# Patient Record
Sex: Female | Born: 1945 | Race: White | Hispanic: No | Marital: Single | Smoking: Former smoker
Health system: Southern US, Community
[De-identification: ages and names within clinical notes are randomized; demographics above are authoritative.]

## PROBLEM LIST (undated history)

## (undated) DIAGNOSIS — G4733 Obstructive sleep apnea (adult) (pediatric): Secondary | ICD-10-CM

## (undated) DIAGNOSIS — Z992 Dependence on renal dialysis: Secondary | ICD-10-CM

## (undated) DIAGNOSIS — Z9981 Dependence on supplemental oxygen: Secondary | ICD-10-CM

## (undated) DIAGNOSIS — F419 Anxiety disorder, unspecified: Secondary | ICD-10-CM

## (undated) DIAGNOSIS — M47819 Spondylosis without myelopathy or radiculopathy, site unspecified: Secondary | ICD-10-CM

## (undated) DIAGNOSIS — I83009 Varicose veins of unspecified lower extremity with ulcer of unspecified site: Secondary | ICD-10-CM

## (undated) DIAGNOSIS — S82409A Unspecified fracture of shaft of unspecified fibula, initial encounter for closed fracture: Secondary | ICD-10-CM

## (undated) DIAGNOSIS — R0602 Shortness of breath: Secondary | ICD-10-CM

## (undated) DIAGNOSIS — E119 Type 2 diabetes mellitus without complications: Secondary | ICD-10-CM

## (undated) DIAGNOSIS — D539 Nutritional anemia, unspecified: Secondary | ICD-10-CM

## (undated) DIAGNOSIS — I10 Essential (primary) hypertension: Secondary | ICD-10-CM

## (undated) DIAGNOSIS — K219 Gastro-esophageal reflux disease without esophagitis: Secondary | ICD-10-CM

## (undated) DIAGNOSIS — N189 Chronic kidney disease, unspecified: Secondary | ICD-10-CM

## (undated) DIAGNOSIS — E785 Hyperlipidemia, unspecified: Secondary | ICD-10-CM

## (undated) DIAGNOSIS — M8580 Other specified disorders of bone density and structure, unspecified site: Secondary | ICD-10-CM

## (undated) DIAGNOSIS — Z86718 Personal history of other venous thrombosis and embolism: Secondary | ICD-10-CM

## (undated) DIAGNOSIS — J9620 Acute and chronic respiratory failure, unspecified whether with hypoxia or hypercapnia: Secondary | ICD-10-CM

## (undated) DIAGNOSIS — N289 Disorder of kidney and ureter, unspecified: Secondary | ICD-10-CM

## (undated) DIAGNOSIS — N186 End stage renal disease: Secondary | ICD-10-CM

## (undated) DIAGNOSIS — I82409 Acute embolism and thrombosis of unspecified deep veins of unspecified lower extremity: Secondary | ICD-10-CM

## (undated) DIAGNOSIS — L97909 Non-pressure chronic ulcer of unspecified part of unspecified lower leg with unspecified severity: Secondary | ICD-10-CM

## (undated) DIAGNOSIS — M1990 Unspecified osteoarthritis, unspecified site: Secondary | ICD-10-CM

## (undated) HISTORY — DX: Type 2 diabetes mellitus without complications: E11.9

## (undated) HISTORY — DX: Essential (primary) hypertension: I10

## (undated) HISTORY — PX: TRIGGER FINGER RELEASE: SHX641

## (undated) HISTORY — DX: Hyperlipidemia, unspecified: E78.5

## (undated) HISTORY — DX: Obstructive sleep apnea (adult) (pediatric): G47.33

## (undated) HISTORY — DX: Spondylosis without myelopathy or radiculopathy, site unspecified: M47.819

## (undated) HISTORY — DX: Other specified disorders of bone density and structure, unspecified site: M85.80

## (undated) HISTORY — DX: Chronic kidney disease, unspecified: N18.9

## (undated) HISTORY — DX: Gastro-esophageal reflux disease without esophagitis: K21.9

---
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
## 1997-03-10 HISTORY — PX: BREAST SURGERY: SHX581

## 1997-10-10 HISTORY — PX: TOTAL KNEE ARTHROPLASTY: SHX125

## 1997-10-10 HISTORY — PX: JOINT REPLACEMENT: SHX530

## 1998-04-20 ENCOUNTER — Inpatient Hospital Stay (HOSPITAL_COMMUNITY): Admission: RE | Admit: 1998-04-20 | Discharge: 1998-04-24 | Payer: Self-pay | Admitting: Orthopedic Surgery

## 1998-04-24 ENCOUNTER — Inpatient Hospital Stay (HOSPITAL_COMMUNITY)
Admission: RE | Admit: 1998-04-24 | Discharge: 1998-05-04 | Payer: Self-pay | Admitting: Physical Medicine and Rehabilitation

## 1998-07-03 ENCOUNTER — Encounter: Admission: RE | Admit: 1998-07-03 | Discharge: 1998-10-01 | Payer: Self-pay | Admitting: Family Medicine

## 1998-09-17 ENCOUNTER — Ambulatory Visit (HOSPITAL_COMMUNITY): Admission: RE | Admit: 1998-09-17 | Discharge: 1998-09-17 | Payer: Self-pay | Admitting: Family Medicine

## 1998-09-17 ENCOUNTER — Encounter: Payer: Self-pay | Admitting: Family Medicine

## 1999-09-29 ENCOUNTER — Ambulatory Visit (HOSPITAL_COMMUNITY): Admission: RE | Admit: 1999-09-29 | Discharge: 1999-09-29 | Payer: Self-pay | Admitting: Family Medicine

## 1999-09-29 ENCOUNTER — Encounter: Payer: Self-pay | Admitting: Family Medicine

## 2000-10-04 ENCOUNTER — Encounter: Payer: Self-pay | Admitting: Family Medicine

## 2000-10-04 ENCOUNTER — Ambulatory Visit (HOSPITAL_COMMUNITY): Admission: RE | Admit: 2000-10-04 | Discharge: 2000-10-04 | Payer: Self-pay | Admitting: Family Medicine

## 2001-06-01 ENCOUNTER — Encounter: Admission: RE | Admit: 2001-06-01 | Discharge: 2001-06-19 | Payer: Self-pay | Admitting: Orthopedic Surgery

## 2001-07-05 ENCOUNTER — Encounter: Payer: Self-pay | Admitting: Orthopedic Surgery

## 2001-07-05 ENCOUNTER — Encounter: Admission: RE | Admit: 2001-07-05 | Discharge: 2001-07-05 | Payer: Self-pay | Admitting: Orthopedic Surgery

## 2001-09-26 ENCOUNTER — Ambulatory Visit (HOSPITAL_COMMUNITY): Admission: RE | Admit: 2001-09-26 | Discharge: 2001-09-26 | Payer: Self-pay | Admitting: Internal Medicine

## 2001-09-26 ENCOUNTER — Encounter (HOSPITAL_BASED_OUTPATIENT_CLINIC_OR_DEPARTMENT_OTHER): Payer: Self-pay | Admitting: Internal Medicine

## 2002-08-27 ENCOUNTER — Encounter (HOSPITAL_BASED_OUTPATIENT_CLINIC_OR_DEPARTMENT_OTHER): Payer: Self-pay | Admitting: Internal Medicine

## 2002-08-27 ENCOUNTER — Ambulatory Visit (HOSPITAL_COMMUNITY): Admission: RE | Admit: 2002-08-27 | Discharge: 2002-08-27 | Payer: Self-pay | Admitting: Internal Medicine

## 2003-08-14 ENCOUNTER — Ambulatory Visit (HOSPITAL_COMMUNITY): Admission: RE | Admit: 2003-08-14 | Discharge: 2003-08-14 | Payer: Self-pay | Admitting: Internal Medicine

## 2004-08-23 ENCOUNTER — Ambulatory Visit (HOSPITAL_COMMUNITY): Admission: RE | Admit: 2004-08-23 | Discharge: 2004-08-23 | Payer: Self-pay | Admitting: Internal Medicine

## 2005-01-29 ENCOUNTER — Encounter: Admission: RE | Admit: 2005-01-29 | Discharge: 2005-01-29 | Payer: Self-pay | Admitting: Internal Medicine

## 2005-02-11 ENCOUNTER — Encounter: Admission: RE | Admit: 2005-02-11 | Discharge: 2005-02-11 | Payer: Self-pay | Admitting: Internal Medicine

## 2005-04-24 ENCOUNTER — Emergency Department (HOSPITAL_COMMUNITY): Admission: EM | Admit: 2005-04-24 | Discharge: 2005-04-24 | Payer: Self-pay | Admitting: *Deleted

## 2005-09-07 ENCOUNTER — Ambulatory Visit (HOSPITAL_COMMUNITY): Admission: RE | Admit: 2005-09-07 | Discharge: 2005-09-07 | Payer: Self-pay | Admitting: Internal Medicine

## 2005-09-08 ENCOUNTER — Ambulatory Visit (HOSPITAL_COMMUNITY): Admission: RE | Admit: 2005-09-08 | Discharge: 2005-09-08 | Payer: Self-pay | Admitting: Internal Medicine

## 2006-04-28 ENCOUNTER — Encounter: Admission: RE | Admit: 2006-04-28 | Discharge: 2006-04-28 | Payer: Self-pay | Admitting: Internal Medicine

## 2006-10-09 ENCOUNTER — Ambulatory Visit (HOSPITAL_COMMUNITY): Admission: RE | Admit: 2006-10-09 | Discharge: 2006-10-09 | Payer: Self-pay | Admitting: Internal Medicine

## 2007-03-15 ENCOUNTER — Encounter: Admission: RE | Admit: 2007-03-15 | Discharge: 2007-03-15 | Payer: Self-pay | Admitting: Internal Medicine

## 2007-04-24 ENCOUNTER — Encounter: Admission: RE | Admit: 2007-04-24 | Discharge: 2007-04-24 | Payer: Self-pay | Admitting: Orthopaedic Surgery

## 2007-08-13 ENCOUNTER — Emergency Department (HOSPITAL_COMMUNITY): Admission: EM | Admit: 2007-08-13 | Discharge: 2007-08-14 | Payer: Self-pay | Admitting: Emergency Medicine

## 2007-10-15 ENCOUNTER — Ambulatory Visit (HOSPITAL_COMMUNITY): Admission: RE | Admit: 2007-10-15 | Discharge: 2007-10-15 | Payer: Self-pay | Admitting: Internal Medicine

## 2007-10-24 ENCOUNTER — Encounter: Admission: RE | Admit: 2007-10-24 | Discharge: 2007-10-24 | Payer: Self-pay | Admitting: Internal Medicine

## 2008-10-28 ENCOUNTER — Ambulatory Visit (HOSPITAL_COMMUNITY): Admission: RE | Admit: 2008-10-28 | Discharge: 2008-10-28 | Payer: Self-pay | Admitting: Internal Medicine

## 2008-11-05 ENCOUNTER — Encounter (INDEPENDENT_AMBULATORY_CARE_PROVIDER_SITE_OTHER): Payer: Self-pay | Admitting: Radiology

## 2008-11-05 ENCOUNTER — Encounter: Admission: RE | Admit: 2008-11-05 | Discharge: 2008-11-05 | Payer: Self-pay | Admitting: Internal Medicine

## 2009-01-23 ENCOUNTER — Emergency Department (HOSPITAL_COMMUNITY): Admission: EM | Admit: 2009-01-23 | Discharge: 2009-01-23 | Payer: Self-pay | Admitting: Emergency Medicine

## 2009-06-11 ENCOUNTER — Emergency Department (HOSPITAL_COMMUNITY): Admission: EM | Admit: 2009-06-11 | Discharge: 2009-06-11 | Payer: Self-pay | Admitting: Emergency Medicine

## 2009-06-29 ENCOUNTER — Encounter: Admission: RE | Admit: 2009-06-29 | Discharge: 2009-06-29 | Payer: Self-pay | Admitting: Orthopaedic Surgery

## 2009-10-01 ENCOUNTER — Emergency Department (HOSPITAL_COMMUNITY): Admission: EM | Admit: 2009-10-01 | Discharge: 2009-10-01 | Payer: Self-pay | Admitting: Emergency Medicine

## 2009-11-10 ENCOUNTER — Ambulatory Visit (HOSPITAL_COMMUNITY): Admission: RE | Admit: 2009-11-10 | Discharge: 2009-11-10 | Payer: Self-pay | Admitting: Internal Medicine

## 2010-04-07 ENCOUNTER — Ambulatory Visit (HOSPITAL_COMMUNITY): Admission: RE | Admit: 2010-04-07 | Discharge: 2010-04-07 | Payer: Self-pay | Admitting: Internal Medicine

## 2010-10-31 ENCOUNTER — Encounter (HOSPITAL_BASED_OUTPATIENT_CLINIC_OR_DEPARTMENT_OTHER): Payer: Self-pay | Admitting: Internal Medicine

## 2010-11-30 ENCOUNTER — Other Ambulatory Visit (HOSPITAL_BASED_OUTPATIENT_CLINIC_OR_DEPARTMENT_OTHER): Payer: Self-pay | Admitting: Internal Medicine

## 2010-11-30 DIAGNOSIS — Z1231 Encounter for screening mammogram for malignant neoplasm of breast: Secondary | ICD-10-CM

## 2010-12-02 ENCOUNTER — Ambulatory Visit (HOSPITAL_COMMUNITY)
Admission: RE | Admit: 2010-12-02 | Discharge: 2010-12-02 | Disposition: A | Discharge status: Home / Self Care | Payer: Medicare Other | Source: Ambulatory Visit | Attending: Internal Medicine | Admitting: Internal Medicine

## 2010-12-02 DIAGNOSIS — Z1231 Encounter for screening mammogram for malignant neoplasm of breast: Secondary | ICD-10-CM

## 2011-01-10 LAB — URINALYSIS, ROUTINE W REFLEX MICROSCOPIC
Bilirubin Urine: NEGATIVE
Glucose, UA: NEGATIVE mg/dL
Hgb urine dipstick: NEGATIVE
Ketones, ur: NEGATIVE mg/dL
Nitrite: NEGATIVE
Protein, ur: NEGATIVE mg/dL
Specific Gravity, Urine: 1.016 (ref 1.005–1.030)
Urobilinogen, UA: 0.2 mg/dL (ref 0.0–1.0)
pH: 6 (ref 5.0–8.0)

## 2011-01-10 LAB — CBC
HCT: 35 % — ABNORMAL LOW (ref 36.0–46.0)
Hemoglobin: 11.7 g/dL — ABNORMAL LOW (ref 12.0–15.0)
MCHC: 33.4 g/dL (ref 30.0–36.0)
MCV: 86.6 fL (ref 78.0–100.0)
Platelets: 282 10*3/uL (ref 150–400)
RBC: 4.05 MIL/uL (ref 3.87–5.11)
RDW: 18.8 % — ABNORMAL HIGH (ref 11.5–15.5)
WBC: 9.6 10*3/uL (ref 4.0–10.5)

## 2011-01-10 LAB — POCT CARDIAC MARKERS
CKMB, poc: 5.6 ng/mL (ref 1.0–8.0)
Myoglobin, poc: 334 ng/mL (ref 12–200)
Troponin i, poc: <0.05 ng/mL (ref 0.00–0.09)

## 2011-01-10 LAB — POCT I-STAT, CHEM 8
BUN: 38 mg/dL — ABNORMAL HIGH (ref 6–23)
Calcium, Ion: 1.24 mmol/L (ref 1.12–1.32)
Chloride: 104 mEq/L (ref 96–112)
Creatinine, Ser: 1.9 mg/dL — ABNORMAL HIGH (ref 0.4–1.2)
Glucose, Bld: 180 mg/dL — ABNORMAL HIGH (ref 70–99)
HCT: 36 % (ref 36.0–46.0)
Hemoglobin: 12.2 g/dL (ref 12.0–15.0)
Potassium: 4 mEq/L (ref 3.5–5.1)
Sodium: 138 mEq/L (ref 135–145)
TCO2: 30 mmol/L (ref 0–100)

## 2011-01-10 LAB — URINE CULTURE: Colony Count: 80000

## 2011-01-10 LAB — DIFFERENTIAL
Basophils Absolute: 0 10*3/uL (ref 0.0–0.1)
Basophils Relative: 1 % (ref 0–1)
Eosinophils Absolute: 0.2 10*3/uL (ref 0.0–0.7)
Eosinophils Relative: 2 % (ref 0–5)
Lymphocytes Relative: 15 % (ref 12–46)
Lymphs Abs: 1.5 10*3/uL (ref 0.7–4.0)
Monocytes Absolute: 0.6 10*3/uL (ref 0.1–1.0)
Monocytes Relative: 7 % (ref 3–12)
Neutro Abs: 7.3 10*3/uL (ref 1.7–7.7)
Neutrophils Relative %: 76 % (ref 43–77)

## 2011-01-10 LAB — PROTIME-INR
INR: 0.95 (ref 0.00–1.49)
Prothrombin Time: 12.6 seconds (ref 11.6–15.2)

## 2011-01-19 LAB — URINALYSIS, ROUTINE W REFLEX MICROSCOPIC
Glucose, UA: NEGATIVE mg/dL
Ketones, ur: NEGATIVE mg/dL
Nitrite: NEGATIVE
Protein, ur: 100 mg/dL — AB
Specific Gravity, Urine: 1.017 (ref 1.005–1.030)
Urobilinogen, UA: 0.2 mg/dL (ref 0.0–1.0)
pH: 6 (ref 5.0–8.0)

## 2011-01-19 LAB — COMPREHENSIVE METABOLIC PANEL
ALT: 43 U/L — ABNORMAL HIGH (ref 0–35)
AST: 26 U/L (ref 0–37)
Albumin: 3.1 g/dL — ABNORMAL LOW (ref 3.5–5.2)
Alkaline Phosphatase: 134 U/L — ABNORMAL HIGH (ref 39–117)
BUN: 18 mg/dL (ref 6–23)
CO2: 30 mEq/L (ref 19–32)
Calcium: 9.2 mg/dL (ref 8.4–10.5)
Chloride: 96 mEq/L (ref 96–112)
Creatinine, Ser: 1.85 mg/dL — ABNORMAL HIGH (ref 0.4–1.2)
GFR calc Af Amer: 33 mL/min — ABNORMAL LOW (ref >60)
GFR calc non Af Amer: 28 mL/min — ABNORMAL LOW (ref >60)
Glucose, Bld: 239 mg/dL — ABNORMAL HIGH (ref 70–99)
Potassium: 3.8 mEq/L (ref 3.5–5.1)
Sodium: 134 mEq/L — ABNORMAL LOW (ref 135–145)
Total Bilirubin: 0.6 mg/dL (ref 0.3–1.2)
Total Protein: 6.6 g/dL (ref 6.0–8.3)

## 2011-01-19 LAB — DIFFERENTIAL
Basophils Absolute: 0 10*3/uL (ref 0.0–0.1)
Basophils Relative: 0 % (ref 0–1)
Eosinophils Absolute: 0 10*3/uL (ref 0.0–0.7)
Eosinophils Relative: 0 % (ref 0–5)
Lymphocytes Relative: 4 % — ABNORMAL LOW (ref 12–46)
Lymphs Abs: 0.8 10*3/uL (ref 0.7–4.0)
Monocytes Absolute: 1 10*3/uL (ref 0.1–1.0)
Monocytes Relative: 5 % (ref 3–12)
Neutro Abs: 18.3 10*3/uL — ABNORMAL HIGH (ref 1.7–7.7)
Neutrophils Relative %: 91 % — ABNORMAL HIGH (ref 43–77)

## 2011-01-19 LAB — URINE CULTURE: Colony Count: >=100000

## 2011-01-19 LAB — GLUCOSE, CAPILLARY: Glucose-Capillary: 246 mg/dL — ABNORMAL HIGH (ref 70–99)

## 2011-01-19 LAB — URINE MICROSCOPIC-ADD ON

## 2011-01-19 LAB — CBC
HCT: 36.7 % (ref 36.0–46.0)
Hemoglobin: 12.3 g/dL (ref 12.0–15.0)
MCHC: 33.6 g/dL (ref 30.0–36.0)
MCV: 83.4 fL (ref 78.0–100.0)
Platelets: 261 10*3/uL (ref 150–400)
RBC: 4.4 MIL/uL (ref 3.87–5.11)
RDW: 16.5 % — ABNORMAL HIGH (ref 11.5–15.5)
WBC: 20.2 10*3/uL — ABNORMAL HIGH (ref 4.0–10.5)
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
## 2011-02-28 ENCOUNTER — Encounter: Payer: Self-pay | Admitting: Gastroenterology

## 2011-04-06 ENCOUNTER — Ambulatory Visit: Payer: Medicare Other | Admitting: Gastroenterology

## 2011-07-19 LAB — DIFFERENTIAL
Basophils Absolute: 0
Basophils Relative: 0
Eosinophils Absolute: 0
Eosinophils Relative: 0
Lymphocytes Relative: 3 — ABNORMAL LOW
Lymphs Abs: 0.3 — ABNORMAL LOW
Monocytes Absolute: 0.2
Monocytes Relative: 2 — ABNORMAL LOW
Neutro Abs: 10.3 — ABNORMAL HIGH
Neutrophils Relative %: 95 — ABNORMAL HIGH

## 2011-07-19 LAB — CULTURE, BLOOD (ROUTINE X 2)
Culture: NO GROWTH
Culture: NO GROWTH

## 2011-07-19 LAB — COMPREHENSIVE METABOLIC PANEL
ALT: 19
AST: 21
Albumin: 3.7
Alkaline Phosphatase: 55
BUN: 21
CO2: 28
Calcium: 8.8
Chloride: 102
Creatinine, Ser: 1.18
GFR calc Af Amer: 56 — ABNORMAL LOW
GFR calc non Af Amer: 47 — ABNORMAL LOW
Glucose, Bld: 141 — ABNORMAL HIGH
Potassium: 3.6
Sodium: 140
Total Bilirubin: 0.8
Total Protein: 6.3

## 2011-07-19 LAB — URINALYSIS, ROUTINE W REFLEX MICROSCOPIC
Bilirubin Urine: NEGATIVE
Glucose, UA: NEGATIVE
Hgb urine dipstick: NEGATIVE
Ketones, ur: NEGATIVE
Nitrite: NEGATIVE
Protein, ur: NEGATIVE
Specific Gravity, Urine: >1.046 — ABNORMAL HIGH
Urobilinogen, UA: 0.2
pH: 6

## 2011-07-19 LAB — CBC
HCT: 37
Hemoglobin: 12.5
MCHC: 33.7
MCV: 89.7
Platelets: 234
RBC: 4.12
RDW: 14.8 — ABNORMAL HIGH
WBC: 10.8 — ABNORMAL HIGH

## 2011-07-19 LAB — LIPASE, BLOOD: Lipase: 65 — ABNORMAL HIGH

## 2011-11-04 ENCOUNTER — Other Ambulatory Visit (HOSPITAL_COMMUNITY): Payer: Self-pay | Admitting: Internal Medicine

## 2011-11-04 DIAGNOSIS — R0600 Dyspnea, unspecified: Secondary | ICD-10-CM

## 2011-11-11 ENCOUNTER — Ambulatory Visit (HOSPITAL_COMMUNITY)
Admission: RE | Admit: 2011-11-11 | Discharge: 2011-11-11 | Disposition: A | Discharge status: Home / Self Care | Payer: Medicare Other | Source: Ambulatory Visit | Attending: Internal Medicine | Admitting: Internal Medicine

## 2011-11-11 ENCOUNTER — Other Ambulatory Visit (HOSPITAL_COMMUNITY): Payer: Self-pay | Admitting: Radiology

## 2011-11-11 DIAGNOSIS — R0609 Other forms of dyspnea: Secondary | ICD-10-CM | POA: Insufficient documentation

## 2011-11-11 DIAGNOSIS — R0989 Other specified symptoms and signs involving the circulatory and respiratory systems: Secondary | ICD-10-CM | POA: Insufficient documentation

## 2011-11-11 MED ORDER — ALBUTEROL SULFATE (5 MG/ML) 0.5% IN NEBU
2.5000 mg | INHALATION_SOLUTION | Freq: Once | RESPIRATORY_TRACT | Status: AC
  Administered 2011-11-11: 2.5 mg via RESPIRATORY_TRACT

## 2012-01-11 ENCOUNTER — Other Ambulatory Visit (HOSPITAL_COMMUNITY): Payer: Self-pay | Admitting: Internal Medicine

## 2012-01-11 DIAGNOSIS — Z1231 Encounter for screening mammogram for malignant neoplasm of breast: Secondary | ICD-10-CM

## 2012-02-03 ENCOUNTER — Ambulatory Visit (HOSPITAL_COMMUNITY)
Admission: RE | Admit: 2012-02-03 | Discharge: 2012-02-03 | Disposition: A | Discharge status: Home / Self Care | Payer: Medicare Other | Source: Ambulatory Visit | Attending: Internal Medicine | Admitting: Internal Medicine

## 2012-02-03 DIAGNOSIS — Z1231 Encounter for screening mammogram for malignant neoplasm of breast: Secondary | ICD-10-CM

## 2012-12-10 ENCOUNTER — Other Ambulatory Visit: Payer: Self-pay | Admitting: Internal Medicine

## 2012-12-10 DIAGNOSIS — R109 Unspecified abdominal pain: Secondary | ICD-10-CM

## 2012-12-12 ENCOUNTER — Ambulatory Visit
Admission: RE | Admit: 2012-12-12 | Discharge: 2012-12-12 | Disposition: A | Discharge status: Home / Self Care | Payer: Medicare Other | Source: Ambulatory Visit | Attending: Internal Medicine | Admitting: Internal Medicine

## 2012-12-12 ENCOUNTER — Other Ambulatory Visit: Payer: Self-pay | Admitting: Internal Medicine

## 2012-12-12 DIAGNOSIS — R109 Unspecified abdominal pain: Secondary | ICD-10-CM

## 2012-12-18 ENCOUNTER — Telehealth (INDEPENDENT_AMBULATORY_CARE_PROVIDER_SITE_OTHER): Payer: Self-pay

## 2012-12-18 NOTE — Telephone Encounter (Signed)
Reviewing records for up coming appt and noted change on CT. Reviewed with Dr Dalbert Batman in office. Per his recommendation I called pt to review any symptoms of her hernia. Pt states she has no hardness,pain,nausea or difficulty having BM at this time. She states she understands to call of pain, hardness at site, nausea, or change in bowel habits occur to call office or go to ER.

## 2013-01-02 ENCOUNTER — Encounter (HOSPITAL_COMMUNITY): Payer: Self-pay | Admitting: Pharmacy Technician

## 2013-01-02 ENCOUNTER — Ambulatory Visit (INDEPENDENT_AMBULATORY_CARE_PROVIDER_SITE_OTHER): Payer: Medicare Other | Admitting: Surgery

## 2013-01-02 ENCOUNTER — Encounter (INDEPENDENT_AMBULATORY_CARE_PROVIDER_SITE_OTHER): Payer: Self-pay | Admitting: Surgery

## 2013-01-02 VITALS — BP 120/70 | HR 91 | Temp 97.2°F | Resp 22 | Ht 65.0 in | Wt 221.8 lb

## 2013-01-02 DIAGNOSIS — K436 Other and unspecified ventral hernia with obstruction, without gangrene: Secondary | ICD-10-CM | POA: Insufficient documentation

## 2013-01-02 NOTE — Patient Instructions (Signed)
[place]  01/02/2013   Your procedure is scheduled on:  01/08/13 Tuesday   Report to [facility] at    0730   AM.  Call this number if you have problems the morning of surgery: [phone]       Remember: TAKE ONE HALF DOSAGE INSULIN Monday NIGHT-  EAT SNACK BEFORE BEDTIME OR MIDNIGHT Monday NIGHT  Do not eat food  Or drink :After Midnight.MONDAY NIGHT   Take these medicines the morning of surgery with A SIP OF WATER :PROLISEC DO NOT TAKE ANY DIABETES MEDICINE MORNING OF SURGERY  .  Contacts, dentures or partial plates can not be worn to surgery  Leave suitcase in the car. After surgery it may be brought to your room.  For patients admitted to the hospital, checkout time is 11:00 AM day of  discharge.             SPECIAL INSTRUCTIONS- SEE [place] PREPARING FOR SURGERY INSTRUCTION SHEET-     DO NOT WEAR JEWELRY, LOTIONS, POWDERS, OR PERFUMES.  WOMEN-- DO NOT SHAVE LEGS OR UNDERARMS FOR 12 HOURS BEFORE SHOWERS. MEN MAY SHAVE FACE.  Patients discharged the day of surgery will not be allowed to drive home. IF going home the day of surgery, you must have a driver and someone to stay with you for the first 24 hours  Name and phone number of your driver:                                                                        Please read over the following fact sheets that you were given: MRSA Information, Incentive Spirometry Sheet, Blood Transfusion Sheet  Information                                                                                   Jean Mcclain  PST 336  [id number]                 [place]                                                  Patient Signature _____________________________

## 2013-01-02 NOTE — Patient Instructions (Signed)
[facility]  HERNIA REPAIR POST OP INSTRUCTIONS  Always review your discharge instruction sheet given to you by the facility where your surgery was performed.  1. A  prescription for pain medication may be given to you upon discharge.  Take your pain medication as prescribed.  If narcotic pain medicine is not needed, then you may take acetaminophen (Tylenol) or ibuprofen (Advil) as needed.  2. Take your usually prescribed medications unless otherwise directed.  3. If you need a refill on your pain medication, please contact your pharmacy.  They will contact our office to request authorization. Prescriptions will not be filled after 5 pm daily or on weekends.  4. You should follow a light diet the first 24 hours after arrival home, such as soup and crackers or toast.  Be sure to include plenty of fluids daily.  Resume your normal diet the day after surgery.  5. Most patients will experience some swelling and bruising around the surgical site.  Ice packs and reclining will help.  Swelling and bruising can take several days to resolve.   6. It is common to experience some constipation if taking pain medication after surgery.  Increasing fluid intake and taking a stool softener (such as Colace) will usually help or prevent this problem from occurring.  A mild laxative (Milk of Magnesia or Miralax) should be taken according to package directions if there are no bowel movements after 48 hours.  7. Unless discharge instructions indicate otherwise, you may remove your bandages 24-48 hours after surgery, and you may shower at that time.  You may have steri-strips (small skin tapes) in place directly over the incision.  These strips should be left on the skin for 7-10 days.  If your surgeon used skin glue on the incision, you may shower in 24 hours.  The glue will flake off over the next 2-3 weeks.  Any sutures or staples will be removed at the office during your follow-up  visit.  8. ACTIVITIES:  You may resume regular (light) daily activities beginning the next day-such as daily self-care, walking, climbing stairs-gradually increasing activities as tolerated.  You may have sexual intercourse when it is comfortable.  Refrain from any heavy lifting or straining until approved by your doctor.  You may drive when you are no longer taking prescription pain medication, you can comfortably wear a seatbelt, and you can safely maneuver your car and apply brakes.  9. You should see your doctor in the office for a follow-up appointment approximately 2-3 weeks after your surgery.  Make sure that you call for this appointment within a day or two after you arrive home to insure a convenient appointment time. 10.   WHEN TO CALL YOUR DOCTOR: 1. Fever greater than 101.0 2. Inability to urinate 3. Persistent nausea and/or vomiting 4. Extreme swelling or bruising 5. Continued bleeding from incision 6. Increased pain, redness, or drainage from the incision  The clinic staff is available to answer your questions during regular business hours.  Please don't hesitate to call and ask to speak to one of the nurses for clinical concerns.  If you have a medical emergency, go to the nearest emergency room or call 911.  A surgeon from [facility] is always on call for the hospital.   [facility] [address], [address], [place], [place]  [address]  [phone] ? [phone] ? FAX [phone]  [email or web address]   

## 2013-01-02 NOTE — Progress Notes (Signed)
General Surgery [facility]  Chief Complaint  Patient presents with  . New Evaluation    eval hernia - referral from Dr. Leanna Battles    HISTORY: Patient is a 67 year old white female referred by her primary care physician for incarcerated ventral hernia. Patient was first noted on CT scan in June of 2011 to have a ventral hernia near the level of the umbilicus in the midline. This contained incarcerated fat. Over the past 6 months the patient has noted enlargement of the bulge at the level of the umbilicus and has developed intermittent pain. Pain may be related to meals. She denies any change in bowel habits. She denies any previous abdominal surgery.  CT scan of the abdomen performed at 12/12/2012 shows incarcerated transverse colon with mild bowel wall thickening. There is no obstruction.  Past Medical History  Diagnosis Date  . Diabetes mellitus, type 2   . Facet joint disease   . Hyperlipidemia   . GERD (gastroesophageal reflux disease)   . Osteopenia   . Vitamin D deficiency   . OSA (obstructive sleep apnea)   . Hypertension      Current Outpatient Prescriptions  Medication Sig Dispense Refill  . Canagliflozin (INVOKANA) 100 MG TABS Take by mouth.      . ergocalciferol (VITAMIN D2) 50000 UNITS capsule Take 50,000 Units by mouth once a week.        . ferrous sulfate 325 (65 FE) MG tablet Take 325 mg by mouth daily with breakfast.        . HUMULIN N PEN 100 UNIT/ML injection       . insulin aspart (NOVOLOG) 100 UNIT/ML injection Inject 10 Units into the skin 2 (two) times daily before a meal.        . lovastatin (MEVACOR) 40 MG tablet Take 40 mg by mouth at bedtime.        Marland Kitchen olmesartan-hydrochlorothiazide (BENICAR HCT) 40-12.5 MG per tablet Take 1 tablet by mouth daily.        Marland Kitchen omeprazole (PRILOSEC) 20 MG capsule Take 20 mg by mouth daily.        Marland Kitchen rOPINIRole (REQUIP) 4 MG tablet Take 4 mg by mouth 2 (two) times daily.         No current  facility-administered medications for this visit.     No Known Allergies   Family History  Problem Relation Age of Onset  . Leukemia Mother   . Hypertension Brother   . Emphysema Father      History   Social History  . Marital Status: Divorced    Spouse Name: N/A    Number of Children: N/A  . Years of Education: N/A   Social History Main Topics  . Smoking status: Former Smoker -- 27 years    Types: Cigarettes    Quit date: 10/10/1981  . Smokeless tobacco: Never Used  . Alcohol Use: No  . Drug Use: No  . Sexually Active: None   Other Topics Concern  . None   Social History Narrative  . None     REVIEW OF SYSTEMS - PERTINENT POSITIVES ONLY: Denies change in bowel habits. Intermittent pain with gradual enlargement of the bulge at the level of the umbilicus. No prior abdominal surgery.  EXAM: Filed Vitals:   01/02/13 0930  BP: 120/70  Pulse: 91  Temp: 97.2 F (36.2 C)  Resp: 22    HEENT: normocephalic; pupils equal and reactive; sclerae clear; dentition good; mucous membranes moist NECK:  symmetric on extension; no palpable anterior or posterior cervical lymphadenopathy; no supraclavicular masses; no tenderness CHEST: clear to auscultation bilaterally without rales, rhonchi, or wheezes CARDIAC: regular rate and rhythm without significant murmur; peripheral pulses are full ABDOMEN: Morbidly obese; soft without distension; bowel sounds present; no mass; no hepatosplenomegaly; obvious bulge just below into the left of the umbilicus consistent with hernia; palpation reveals mild tenderness; partially reducible but not fully reducible EXT:  non-tender without edema; no deformity NEURO: no gross focal deficits; no sign of tremor   LABORATORY RESULTS: See [facility] (CHL-Epic) for most recent results   RADIOLOGY RESULTS: See [facility] (CHL-Epic) for most recent results   IMPRESSION: #1 incarcerated ventral hernia, symptomatic #2  insulin-dependent diabetes #3 hypertension #4 morbid obesity  PLAN: I discussed the above findings with the patient and her family. I believe she needs to undergo operative repair in the near future. We discussed open technique versus laparoscopic technique. I feel she would be a good candidate for laparoscopic surgery with a large overlap of mesh around the fascial defect. We discussed the risk and benefits of the procedure including the possibility of open surgery and the possibility of recurrence. I explained that her risk of recurrence of her hernia was approximately 5-10%. We discussed the hospital stay to be anticipated and the restrictions on her activities following the procedure. We discussed the need to wear an abdominal binder for approximately 6 weeks. She understands and wishes to proceed with surgery in the near future.  The risks and benefits of the procedure have been discussed at length with the patient.  The patient understands the proposed procedure, potential alternative treatments, and the course of recovery to be expected.  All of the patient's questions have been answered at this time.  The patient wishes to proceed with surgery.  Earnstine Regal, MD, [facility]   Visit Diagnoses: 1. Incarcerated ventral hernia     Primary Care Physician: Donnajean Lopes, MD

## 2013-01-03 ENCOUNTER — Inpatient Hospital Stay (HOSPITAL_COMMUNITY)
Admission: RE | Admit: 2013-01-03 | Discharge: 2013-01-03 | Disposition: A | Discharge status: Home / Self Care | Payer: Medicare Other | Source: Ambulatory Visit

## 2013-01-03 NOTE — Patient Instructions (Signed)
[place]  01/03/2013   Your procedure is scheduled on: 01/08/13    Report to [place] at    0730 AM.  Call this number if you have problems the morning of surgery: [phone]   Remember:   Do not eat food or drink liquids after midnight.   Take these medicines the morning of surgery with A SIP OF WATER:    Do not wear jewelry, make-up or nail polish.  Do not wear lotions, powders, or perfumes.   Do not shave 48 hours prior to surgery.   Do not bring valuables to the hospital.  Contacts, dentures or bridgework may not be worn into surgery.  Leave suitcase in the car. After surgery it may be brought to your room.  For patients admitted to the hospital, checkout time is 11:00 AM the day of  discharge.    SEE [facility] INSTRUCTION SHEET    Please read over the following fact sheets that you were given: MRSA Information, coughing and deep breathing exercises, leg exercises               Failure to comply with these instructions may result in cancellation of your surgery.                Patient Signature ____________________________              Nurse Signature _____________________________

## 2013-01-04 ENCOUNTER — Telehealth (INDEPENDENT_AMBULATORY_CARE_PROVIDER_SITE_OTHER): Payer: Self-pay

## 2013-01-04 ENCOUNTER — Encounter (HOSPITAL_COMMUNITY)
Admission: RE | Admit: 2013-01-04 | Discharge: 2013-01-04 | Disposition: A | Discharge status: Home / Self Care | Payer: Medicare Other | Source: Ambulatory Visit | Attending: Surgery | Admitting: Surgery

## 2013-01-04 ENCOUNTER — Encounter (HOSPITAL_COMMUNITY): Payer: Self-pay

## 2013-01-04 HISTORY — DX: Shortness of breath: R06.02

## 2013-01-04 HISTORY — DX: Unspecified osteoarthritis, unspecified site: M19.90

## 2013-01-04 LAB — CBC
HCT: 39.1 % (ref 36.0–46.0)
Hemoglobin: 12.3 g/dL (ref 12.0–15.0)
MCH: 29.4 pg (ref 26.0–34.0)
MCHC: 31.5 g/dL (ref 30.0–36.0)
MCV: 93.3 fL (ref 78.0–100.0)
Platelets: 235 10*3/uL (ref 150–400)
RBC: 4.19 MIL/uL (ref 3.87–5.11)
RDW: 14.4 % (ref 11.5–15.5)
WBC: 6.9 10*3/uL (ref 4.0–10.5)

## 2013-01-04 LAB — SURGICAL PCR SCREEN
MRSA, PCR: NEGATIVE
Staphylococcus aureus: NEGATIVE

## 2013-01-04 LAB — BASIC METABOLIC PANEL
BUN: 32 mg/dL — ABNORMAL HIGH (ref 6–23)
CO2: 28 mEq/L (ref 19–32)
Calcium: 9 mg/dL (ref 8.4–10.5)
Chloride: 97 mEq/L (ref 96–112)
Creatinine, Ser: 1.44 mg/dL — ABNORMAL HIGH (ref 0.50–1.10)
GFR calc Af Amer: 43 mL/min — ABNORMAL LOW (ref >90)
GFR calc non Af Amer: 37 mL/min — ABNORMAL LOW (ref >90)
Glucose, Bld: 358 mg/dL — ABNORMAL HIGH (ref 70–99)
Potassium: 4.1 mEq/L (ref 3.5–5.1)
Sodium: 135 mEq/L (ref 135–145)

## 2013-01-04 NOTE — Telephone Encounter (Signed)
Jean Mcclain at presurg testing notified I spoke with Jean Mcclain at Dr Shon Baton office and elevated blood sugar 250-400 is where this pts level usually runs. She also indicated pt had breakfast prior to labs and did not take her insulin this morning. I advised her Dr Philip Aspen will fax note of clearance and I will have MR scan in to media as soon as it comes in. Jean Mcclain states she will make note of this for anesthesia.

## 2013-01-04 NOTE — Progress Notes (Signed)
Abnormal BMP results faxed via Inbox of epic to Dr Harlow Asa.

## 2013-01-04 NOTE — Telephone Encounter (Signed)
Patient called back and is now calling her PMD regarding the elevated blood glucose to see what they want her to do.  Patient informed of CBG 358 at pre-op.

## 2013-01-04 NOTE — Progress Notes (Signed)
Jean Mcclain, from office of [facility] spoke with Dr Philip Aspen office regarding blood glucose results on preop labs from today. Per Jean Mcclain, per office of Dr Philip Aspen patient's blood glucose averages 250-400.  Jean Mcclain is to have scanned in under media clearance note form Dr Philip Aspen.  Jean Mcclain stated that patient had eaten breakfast prior to preop appointment but had not taken insulin this am.

## 2013-01-04 NOTE — Telephone Encounter (Signed)
Dr Harlow Asa request we try to reach pt and have her seen by her PCP asap due to elevated blood glucose at pre op testing. If pt cannot see the MD that manages her blood sugar levels we may have to cx surgery on Tuesday. I have lmom to call office asap.

## 2013-01-04 NOTE — Telephone Encounter (Signed)
DJ from Dr Shon Baton office called stating pt normally has blood sugar levels 250-400. She states this is the levels the pt has had for years. DJ will fax clearance note from Dr Philip Aspen.

## 2013-01-07 ENCOUNTER — Telehealth (INDEPENDENT_AMBULATORY_CARE_PROVIDER_SITE_OTHER): Payer: Self-pay

## 2013-01-07 ENCOUNTER — Encounter (INDEPENDENT_AMBULATORY_CARE_PROVIDER_SITE_OTHER): Payer: Self-pay

## 2013-01-07 NOTE — Telephone Encounter (Signed)
Clearance note from Dr Philip Aspen received and to be scanned.

## 2013-01-08 ENCOUNTER — Observation Stay (HOSPITAL_COMMUNITY)
Admission: RE | Admit: 2013-01-08 | Discharge: 2013-01-11 | Disposition: A | Discharge status: Home / Self Care | Payer: Medicare Other | Source: Ambulatory Visit | Attending: Surgery | Admitting: Surgery

## 2013-01-08 ENCOUNTER — Encounter (HOSPITAL_COMMUNITY): Payer: Self-pay | Admitting: Anesthesiology

## 2013-01-08 ENCOUNTER — Ambulatory Visit (HOSPITAL_COMMUNITY): Payer: Medicare Other | Admitting: Anesthesiology

## 2013-01-08 ENCOUNTER — Encounter (HOSPITAL_COMMUNITY): Payer: Self-pay | Admitting: *Deleted

## 2013-01-08 ENCOUNTER — Encounter (HOSPITAL_COMMUNITY)
Admission: RE | Disposition: A | Discharge status: Home / Self Care | Payer: Self-pay | Source: Ambulatory Visit | Attending: Surgery

## 2013-01-08 DIAGNOSIS — Z794 Long term (current) use of insulin: Secondary | ICD-10-CM | POA: Insufficient documentation

## 2013-01-08 DIAGNOSIS — M949 Disorder of cartilage, unspecified: Secondary | ICD-10-CM | POA: Insufficient documentation

## 2013-01-08 DIAGNOSIS — G4733 Obstructive sleep apnea (adult) (pediatric): Secondary | ICD-10-CM | POA: Insufficient documentation

## 2013-01-08 DIAGNOSIS — E559 Vitamin D deficiency, unspecified: Secondary | ICD-10-CM | POA: Insufficient documentation

## 2013-01-08 DIAGNOSIS — K219 Gastro-esophageal reflux disease without esophagitis: Secondary | ICD-10-CM | POA: Insufficient documentation

## 2013-01-08 DIAGNOSIS — K436 Other and unspecified ventral hernia with obstruction, without gangrene: Secondary | ICD-10-CM

## 2013-01-08 DIAGNOSIS — E785 Hyperlipidemia, unspecified: Secondary | ICD-10-CM | POA: Insufficient documentation

## 2013-01-08 DIAGNOSIS — I10 Essential (primary) hypertension: Secondary | ICD-10-CM | POA: Insufficient documentation

## 2013-01-08 DIAGNOSIS — M899 Disorder of bone, unspecified: Secondary | ICD-10-CM | POA: Insufficient documentation

## 2013-01-08 DIAGNOSIS — E119 Type 2 diabetes mellitus without complications: Secondary | ICD-10-CM | POA: Insufficient documentation

## 2013-01-08 DIAGNOSIS — Z79899 Other long term (current) drug therapy: Secondary | ICD-10-CM | POA: Insufficient documentation

## 2013-01-08 HISTORY — PX: VENTRAL HERNIA REPAIR: SHX424

## 2013-01-08 HISTORY — PX: INSERTION OF MESH: SHX5868

## 2013-01-08 LAB — GLUCOSE, CAPILLARY
Glucose-Capillary: 267 mg/dL — ABNORMAL HIGH (ref 70–99)
Glucose-Capillary: 266 mg/dL — ABNORMAL HIGH (ref 70–99)
Glucose-Capillary: 228 mg/dL — ABNORMAL HIGH (ref 70–99)
Glucose-Capillary: 215 mg/dL — ABNORMAL HIGH (ref 70–99)
Glucose-Capillary: 185 mg/dL — ABNORMAL HIGH (ref 70–99)

## 2013-01-08 SURGERY — REPAIR, HERNIA, VENTRAL, LAPAROSCOPIC
Anesthesia: General | Wound class: Clean

## 2013-01-08 MED ORDER — PROMETHAZINE HCL 25 MG/ML IJ SOLN: 6.2500 mg | INTRAMUSCULAR | Status: DC | PRN

## 2013-01-08 MED ORDER — HYDROMORPHONE HCL PF 1 MG/ML IJ SOLN
INTRAMUSCULAR | Status: AC
  Filled 2013-01-08: qty 1

## 2013-01-08 MED ORDER — PROPOFOL 10 MG/ML IV BOLUS
INTRAVENOUS | Status: DC | PRN
  Administered 2013-01-08: 150 mg via INTRAVENOUS

## 2013-01-08 MED ORDER — OXYCODONE HCL 5 MG/5ML PO SOLN: 5.0000 mg | Freq: Once | ORAL | Status: DC | PRN

## 2013-01-08 MED ORDER — LACTATED RINGERS IV SOLN
INTRAVENOUS | Status: DC | PRN
  Administered 2013-01-08 (×2): via INTRAVENOUS

## 2013-01-08 MED ORDER — ACETAMINOPHEN 10 MG/ML IV SOLN
1000.0000 mg | Freq: Four times a day (QID) | INTRAVENOUS | Status: AC
  Administered 2013-01-08 – 2013-01-09 (×3): 1000 mg via INTRAVENOUS
  Filled 2013-01-08 (×6): qty 100

## 2013-01-08 MED ORDER — MIDAZOLAM HCL 5 MG/5ML IJ SOLN
INTRAMUSCULAR | Status: DC | PRN
  Administered 2013-01-08: 2 mg via INTRAVENOUS

## 2013-01-08 MED ORDER — EPHEDRINE SULFATE 50 MG/ML IJ SOLN
INTRAMUSCULAR | Status: DC | PRN
  Administered 2013-01-08: 5 mg via INTRAVENOUS
  Administered 2013-01-08: 10 mg via INTRAVENOUS
  Administered 2013-01-08: 5 mg via INTRAVENOUS

## 2013-01-08 MED ORDER — HYDROMORPHONE HCL PF 1 MG/ML IJ SOLN
0.2500 mg | INTRAMUSCULAR | Status: DC | PRN
  Administered 2013-01-08 (×2): 0.5 mg via INTRAVENOUS

## 2013-01-08 MED ORDER — CEFAZOLIN SODIUM-DEXTROSE 2-3 GM-% IV SOLR
2.0000 g | INTRAVENOUS | Status: AC
  Administered 2013-01-08: 2 g via INTRAVENOUS

## 2013-01-08 MED ORDER — MEPERIDINE HCL 50 MG/ML IJ SOLN: 6.2500 mg | INTRAMUSCULAR | Status: DC | PRN

## 2013-01-08 MED ORDER — HYDROMORPHONE HCL PF 1 MG/ML IJ SOLN
0.2500 mg | INTRAMUSCULAR | Status: DC | PRN
  Administered 2013-01-08 (×4): 0.5 mg via INTRAVENOUS

## 2013-01-08 MED ORDER — LACTATED RINGERS IV SOLN: INTRAVENOUS | Status: DC

## 2013-01-08 MED ORDER — NEOSTIGMINE METHYLSULFATE 1 MG/ML IJ SOLN
INTRAMUSCULAR | Status: DC | PRN
  Administered 2013-01-08: 3.5 mg via INTRAVENOUS

## 2013-01-08 MED ORDER — INSULIN ASPART 100 UNIT/ML ~~LOC~~ SOLN
0.0000 [IU] | Freq: Three times a day (TID) | SUBCUTANEOUS | Status: DC
  Administered 2013-01-08 – 2013-01-09 (×2): 5 [IU] via SUBCUTANEOUS
  Administered 2013-01-09: 3 [IU] via SUBCUTANEOUS
  Administered 2013-01-09: 5 [IU] via SUBCUTANEOUS
  Administered 2013-01-10: 3 [IU] via SUBCUTANEOUS
  Administered 2013-01-10 (×2): 5 [IU] via SUBCUTANEOUS
  Administered 2013-01-11: 2 [IU] via SUBCUTANEOUS

## 2013-01-08 MED ORDER — ONDANSETRON HCL 4 MG/2ML IJ SOLN
INTRAMUSCULAR | Status: DC | PRN
  Administered 2013-01-08: 4 mg via INTRAVENOUS

## 2013-01-08 MED ORDER — CEFAZOLIN SODIUM-DEXTROSE 2-3 GM-% IV SOLR
INTRAVENOUS | Status: AC
  Filled 2013-01-08: qty 50

## 2013-01-08 MED ORDER — PANTOPRAZOLE SODIUM 40 MG PO TBEC
40.0000 mg | DELAYED_RELEASE_TABLET | Freq: Every day | ORAL | Status: DC
  Administered 2013-01-09 – 2013-01-11 (×3): 40 mg via ORAL
  Filled 2013-01-08 (×3): qty 1

## 2013-01-08 MED ORDER — INSULIN NPH (HUMAN) (ISOPHANE) 100 UNIT/ML ~~LOC~~ SUSP
25.0000 [IU] | Freq: Two times a day (BID) | SUBCUTANEOUS | Status: DC
  Administered 2013-01-08 – 2013-01-11 (×6): 25 [IU] via SUBCUTANEOUS
  Filled 2013-01-08: qty 10

## 2013-01-08 MED ORDER — DEXTROSE-NACL 5-0.45 % IV SOLN
INTRAVENOUS | Status: DC
  Administered 2013-01-08: 16:00:00 via INTRAVENOUS

## 2013-01-08 MED ORDER — INSULIN ASPART 100 UNIT/ML ~~LOC~~ SOLN
0.0000 [IU] | Freq: Every day | SUBCUTANEOUS | Status: DC
  Administered 2013-01-09: 3 [IU] via SUBCUTANEOUS
  Administered 2013-01-10: 5 [IU] via SUBCUTANEOUS

## 2013-01-08 MED ORDER — HYDROCODONE-ACETAMINOPHEN 10-325 MG PO TABS
1.0000 | ORAL_TABLET | ORAL | Status: DC | PRN
  Administered 2013-01-08: 2 via ORAL
  Filled 2013-01-08: qty 2

## 2013-01-08 MED ORDER — FENTANYL CITRATE 0.05 MG/ML IJ SOLN
INTRAMUSCULAR | Status: DC | PRN
  Administered 2013-01-08: 100 ug via INTRAVENOUS
  Administered 2013-01-08 (×3): 50 ug via INTRAVENOUS

## 2013-01-08 MED ORDER — ROPINIROLE HCL 1 MG PO TABS
4.0000 mg | ORAL_TABLET | Freq: Two times a day (BID) | ORAL | Status: DC
  Administered 2013-01-08 – 2013-01-11 (×6): 4 mg via ORAL
  Filled 2013-01-08 (×7): qty 4

## 2013-01-08 MED ORDER — INSULIN ASPART 100 UNIT/ML ~~LOC~~ SOLN
SUBCUTANEOUS | Status: DC | PRN
  Administered 2013-01-08: 5 [IU] via SUBCUTANEOUS

## 2013-01-08 MED ORDER — LIDOCAINE HCL (PF) 2 % IJ SOLN
INTRAMUSCULAR | Status: DC | PRN
  Administered 2013-01-08: 20 mg

## 2013-01-08 MED ORDER — KCL IN DEXTROSE-NACL 20-5-0.45 MEQ/L-%-% IV SOLN
INTRAVENOUS | Status: DC
  Administered 2013-01-08 – 2013-01-10 (×3): via INTRAVENOUS
  Filled 2013-01-08 (×4): qty 1000

## 2013-01-08 MED ORDER — ACETAMINOPHEN 10 MG/ML IV SOLN
INTRAVENOUS | Status: AC
  Filled 2013-01-08: qty 100

## 2013-01-08 MED ORDER — 0.9 % SODIUM CHLORIDE (POUR BTL) OPTIME
TOPICAL | Status: DC | PRN
  Administered 2013-01-08: 1000 mL

## 2013-01-08 MED ORDER — BUPIVACAINE-EPINEPHRINE PF 0.25-1:200000 % IJ SOLN
INTRAMUSCULAR | Status: AC
  Filled 2013-01-08: qty 30

## 2013-01-08 MED ORDER — GLYCOPYRROLATE 0.2 MG/ML IJ SOLN
INTRAMUSCULAR | Status: DC | PRN
  Administered 2013-01-08: .5 mg via INTRAVENOUS

## 2013-01-08 MED ORDER — IRBESARTAN 300 MG PO TABS
300.0000 mg | ORAL_TABLET | Freq: Every day | ORAL | Status: DC
  Administered 2013-01-08 – 2013-01-11 (×4): 300 mg via ORAL
  Filled 2013-01-08 (×4): qty 1

## 2013-01-08 MED ORDER — HYDROCHLOROTHIAZIDE 12.5 MG PO CAPS
12.5000 mg | ORAL_CAPSULE | Freq: Every day | ORAL | Status: DC
  Administered 2013-01-08 – 2013-01-11 (×4): 12.5 mg via ORAL
  Filled 2013-01-08 (×4): qty 1

## 2013-01-08 MED ORDER — LACTATED RINGERS IV SOLN: INTRAVENOUS | Status: DC | PRN

## 2013-01-08 MED ORDER — OXYCODONE HCL 5 MG PO TABS: 5.0000 mg | ORAL_TABLET | Freq: Once | ORAL | Status: DC | PRN

## 2013-01-08 MED ORDER — BUPIVACAINE-EPINEPHRINE 0.25% -1:200000 IJ SOLN
INTRAMUSCULAR | Status: DC | PRN
  Administered 2013-01-08: 20 mL

## 2013-01-08 MED ORDER — OLMESARTAN MEDOXOMIL-HCTZ 40-12.5 MG PO TABS: 1.0000 | ORAL_TABLET | Freq: Every day | ORAL | Status: DC

## 2013-01-08 MED ORDER — INSULIN ASPART 100 UNIT/ML ~~LOC~~ SOLN
14.0000 [IU] | Freq: Two times a day (BID) | SUBCUTANEOUS | Status: DC
  Administered 2013-01-08 – 2013-01-11 (×6): 14 [IU] via SUBCUTANEOUS

## 2013-01-08 MED ORDER — HYDROMORPHONE HCL PF 1 MG/ML IJ SOLN
1.0000 mg | INTRAMUSCULAR | Status: DC | PRN
  Administered 2013-01-08 – 2013-01-09 (×5): 1 mg via INTRAVENOUS
  Filled 2013-01-08 (×5): qty 1

## 2013-01-08 MED ORDER — ROCURONIUM BROMIDE 100 MG/10ML IV SOLN
INTRAVENOUS | Status: DC | PRN
  Administered 2013-01-08: 10 mg via INTRAVENOUS
  Administered 2013-01-08: 30 mg via INTRAVENOUS
  Administered 2013-01-08: 10 mg via INTRAVENOUS

## 2013-01-08 MED ORDER — INSULIN ASPART 100 UNIT/ML ~~LOC~~ SOLN
SUBCUTANEOUS | Status: AC
  Filled 2013-01-08: qty 1

## 2013-01-08 MED ORDER — SUCCINYLCHOLINE CHLORIDE 20 MG/ML IJ SOLN
INTRAMUSCULAR | Status: DC | PRN
  Administered 2013-01-08: 100 mg via INTRAVENOUS

## 2013-01-08 MED ORDER — ACETAMINOPHEN 10 MG/ML IV SOLN: 1000.0000 mg | Freq: Once | INTRAVENOUS | Status: DC | PRN

## 2013-01-08 SURGICAL SUPPLY — 45 items
APL SKNCLS STERI-STRIP NONHPOA (GAUZE/BANDAGES/DRESSINGS) ×1
BENZOIN TINCTURE PRP APPL 2/3 (GAUZE/BANDAGES/DRESSINGS) ×2 IMPLANT
BINDER ABD UNIV 12 45-62 (WOUND CARE) ×1 IMPLANT
BINDER ABDOMINAL 46IN 62IN (WOUND CARE) ×2
CANISTER SUCTION 2500CC (MISCELLANEOUS) ×2 IMPLANT
CHLORAPREP W/TINT 10.5 ML (MISCELLANEOUS) ×3 IMPLANT
CLOTH BEACON ORANGE TIMEOUT ST (SAFETY) ×2 IMPLANT
DECANTER SPIKE VIAL GLASS SM (MISCELLANEOUS) ×1 IMPLANT
DEVICE SECURE STRAP 25 ABSORB (INSTRUMENTS) ×2 IMPLANT
DEVICE TROCAR PUNCTURE CLOSURE (ENDOMECHANICALS) ×2 IMPLANT
DISSECTOR BLUNT TIP ENDO 5MM (MISCELLANEOUS) IMPLANT
DRAPE INCISE IOBAN 66X45 STRL (DRAPES) ×2 IMPLANT
DRAPE LAPAROSCOPIC ABDOMINAL (DRAPES) ×2 IMPLANT
ELECT REM PT RETURN 9FT ADLT (ELECTROSURGICAL) ×2
ELECTRODE REM PT RTRN 9FT ADLT (ELECTROSURGICAL) ×1 IMPLANT
GLOVE BIOGEL PI IND STRL 7.0 (GLOVE) ×1 IMPLANT
GLOVE BIOGEL PI INDICATOR 7.0 (GLOVE) ×1
GLOVE SURG ORTHO 8.0 STRL STRW (GLOVE) ×2 IMPLANT
GLOVE SURG SS PI 6.0 STRL IVOR (GLOVE) ×1 IMPLANT
GLOVE SURG SS PI 6.5 STRL IVOR (GLOVE) ×1 IMPLANT
GOWN STRL NON-REIN LRG LVL3 (GOWN DISPOSABLE) ×4 IMPLANT
GOWN STRL REIN XL XLG (GOWN DISPOSABLE) ×6 IMPLANT
HAND ACTIVATED (MISCELLANEOUS) IMPLANT
KIT BASIN OR (CUSTOM PROCEDURE TRAY) ×2 IMPLANT
MARKER SKIN DUAL TIP RULER LAB (MISCELLANEOUS) ×2 IMPLANT
MESH VENTRALIGHT ST 6IN CRC (Mesh General) ×1 IMPLANT
NDL SPNL 22GX3.5 QUINCKE BK (NEEDLE) ×4 IMPLANT
NEEDLE SPNL 22GX3.5 QUINCKE BK (NEEDLE) ×8 IMPLANT
PENCIL BUTTON HOLSTER BLD 10FT (ELECTRODE) IMPLANT
SCISSORS LAP 5X35 DISP (ENDOMECHANICALS) ×1 IMPLANT
SET IRRIG TUBING LAPAROSCOPIC (IRRIGATION / IRRIGATOR) IMPLANT
SOLUTION ANTI FOG 6CC (MISCELLANEOUS) ×2 IMPLANT
SPONGE GAUZE 4X4 12PLY (GAUZE/BANDAGES/DRESSINGS) ×1 IMPLANT
STRIP CLOSURE SKIN 1/2X4 (GAUZE/BANDAGES/DRESSINGS) ×3 IMPLANT
SUT MNCRL AB 4-0 PS2 18 (SUTURE) ×1 IMPLANT
SUT NOVA 0 T19/GS 22DT (SUTURE) ×3 IMPLANT
SUT NOVA NAB DX-16 0-1 5-0 T12 (SUTURE) ×2 IMPLANT
TACKER 5MM HERNIA 3.5CML NAB (ENDOMECHANICALS) IMPLANT
TAPE CLOTH SURG 4X10 WHT LF (GAUZE/BANDAGES/DRESSINGS) ×1 IMPLANT
TOWEL OR 17X26 10 PK STRL BLUE (TOWEL DISPOSABLE) ×2 IMPLANT
TRAY FOLEY CATH 14FRSI W/METER (CATHETERS) ×3 IMPLANT
TRAY LAP CHOLE (CUSTOM PROCEDURE TRAY) ×2 IMPLANT
TROCAR BLADELESS OPT 5 100 (ENDOMECHANICALS) ×1 IMPLANT
TROCAR SLEEVE XCEL 5X75 (ENDOMECHANICALS) ×1 IMPLANT
TUBING INSUFFLATION 10FT LAP (TUBING) ×2 IMPLANT

## 2013-01-08 NOTE — Transfer of Care (Signed)
Immediate Anesthesia Transfer of Care Note  Patient: Jean Mcclain  Procedure(s) Performed: Procedure(s): LAPAROSCOPIC VENTRAL HERNIA (N/A) INSERTION OF MESH (N/A)  Patient Location: PACU  Anesthesia Type:General  Level of Consciousness: awake, alert , oriented and patient cooperative  Airway & Oxygen Therapy: Patient Spontanous Breathing and Patient connected to face mask oxygen  Post-op Assessment: Report given to PACU RN and Post -op Vital signs reviewed and stable  Post vital signs: Reviewed and stable  Complications: No apparent anesthesia complications

## 2013-01-08 NOTE — Brief Op Note (Signed)
01/08/2013  11:46 AM  PATIENT:  Jean Mcclain  67 y.o. female  PRE-OPERATIVE DIAGNOSIS:  incarcerated ventral hernia   POST-OPERATIVE DIAGNOSIS:  incarcerated ventral hernia   PROCEDURE:  Procedure(s): LAPAROSCOPIC VENTRAL HERNIA (N/A) INSERTION OF MESH (N/A)  SURGEON:  Surgeon(s) and Role:    * Earnstine Regal, MD - Primary    * Ralene Ok, MD - Assisting  ANESTHESIA:   general  EBL:  Total I/O In: 1000 [I.V.:1000] Out: 100 [Urine:100]  BLOOD ADMINISTERED:none  DRAINS: none   LOCAL MEDICATIONS USED:  MARCAINE     SPECIMEN:  No Specimen  DISPOSITION OF SPECIMEN:  N/A  COUNTS:  YES  TOURNIQUET:  * No tourniquets in log *  DICTATION: .Other Dictation: Dictation Number [id number]  PLAN OF CARE: Admit for overnight observation  PATIENT DISPOSITION:  PACU - hemodynamically stable.   Delay start of Pharmacological VTE agent (>24hrs) due to surgical blood loss or risk of bleeding: yes  Earnstine Regal, MD, [facility] Office: [phone]

## 2013-01-08 NOTE — Anesthesia Preprocedure Evaluation (Addendum)
Anesthesia Evaluation  Patient identified by MRN, date of birth, ID band Patient awake    Reviewed: Allergy & Precautions, H&P , NPO status , Patient's Chart, lab work & pertinent test results, reviewed documented beta blocker date and time   Airway Mallampati: II TM Distance: >3 FB Neck ROM: Full    Dental  (+) Dental Advisory Given and Teeth Intact   Pulmonary shortness of breath and with exertion, sleep apnea , former smoker,  breath sounds clear to auscultation  Pulmonary exam normal       Cardiovascular hypertension, Pt. on medications and Pt. on home beta blockers Rhythm:Regular Rate:Normal     Neuro/Psych negative neurological ROS  negative psych ROS   GI/Hepatic Neg liver ROS, GERD-  Medicated,  Endo/Other  diabetes, Poorly Controlled, Type 2, Insulin DependentMorbid obesity  Renal/GU negative Renal ROS     Musculoskeletal negative musculoskeletal ROS (+)   Abdominal (+) + obese,   Peds  Hematology negative hematology ROS (+)   Anesthesia Other Findings   Reproductive/Obstetrics                         Anesthesia Physical Anesthesia Plan  ASA: III  Anesthesia Plan: General   Post-op Pain Management:    Induction: Intravenous  Airway Management Planned: Oral ETT  Additional Equipment:   Intra-op Plan:   Post-operative Plan: Extubation in OR  Informed Consent: I have reviewed the patients History and Physical, chart, labs and discussed the procedure including the risks, benefits and alternatives for the proposed anesthesia with the patient or authorized representative who has indicated his/her understanding and acceptance.   Dental advisory given  Plan Discussed with: CRNA  Anesthesia Plan Comments:         Anesthesia Quick Evaluation

## 2013-01-08 NOTE — Interval H&P Note (Signed)
History and Physical Interval Note:  01/08/2013 9:17 AM  Jean Mcclain  has presented today for surgery, with the diagnosis of incarcerated ventral hernia.   The various methods of treatment have been discussed with the patient and family. After consideration of risks, benefits and other options for treatment, the patient has consented to    Procedure(s): LAPAROSCOPIC VENTRAL HERNIA (N/A) INSERTION OF MESH (N/A) as a surgical intervention .    The patient's history has been reviewed, patient examined, no change in status, stable for surgery.  I have reviewed the patient's chart and labs.  Questions were answered to the patient's satisfaction.    Earnstine Regal, MD, [facility] Office: [place]

## 2013-01-08 NOTE — H&P (View-Only) (Signed)
General Surgery [facility]  Chief Complaint  Patient presents with  . New Evaluation    eval hernia - referral from Dr. Leanna Battles    HISTORY: Patient is a 67 year old white female referred by her primary care physician for incarcerated ventral hernia. Patient was first noted on CT scan in June of 2011 to have a ventral hernia near the level of the umbilicus in the midline. This contained incarcerated fat. Over the past 6 months the patient has noted enlargement of the bulge at the level of the umbilicus and has developed intermittent pain. Pain may be related to meals. She denies any change in bowel habits. She denies any previous abdominal surgery.  CT scan of the abdomen performed at 12/12/2012 shows incarcerated transverse colon with mild bowel wall thickening. There is no obstruction.  Past Medical History  Diagnosis Date  . Diabetes mellitus, type 2   . Facet joint disease   . Hyperlipidemia   . GERD (gastroesophageal reflux disease)   . Osteopenia   . Vitamin D deficiency   . OSA (obstructive sleep apnea)   . Hypertension      Current Outpatient Prescriptions  Medication Sig Dispense Refill  . Canagliflozin (INVOKANA) 100 MG TABS Take by mouth.      . ergocalciferol (VITAMIN D2) 50000 UNITS capsule Take 50,000 Units by mouth once a week.        . ferrous sulfate 325 (65 FE) MG tablet Take 325 mg by mouth daily with breakfast.        . HUMULIN N PEN 100 UNIT/ML injection       . insulin aspart (NOVOLOG) 100 UNIT/ML injection Inject 10 Units into the skin 2 (two) times daily before a meal.        . lovastatin (MEVACOR) 40 MG tablet Take 40 mg by mouth at bedtime.        Marland Kitchen olmesartan-hydrochlorothiazide (BENICAR HCT) 40-12.5 MG per tablet Take 1 tablet by mouth daily.        Marland Kitchen omeprazole (PRILOSEC) 20 MG capsule Take 20 mg by mouth daily.        Marland Kitchen rOPINIRole (REQUIP) 4 MG tablet Take 4 mg by mouth 2 (two) times daily.         No current  facility-administered medications for this visit.     No Known Allergies   Family History  Problem Relation Age of Onset  . Leukemia Mother   . Hypertension Brother   . Emphysema Father      History   Social History  . Marital Status: Divorced    Spouse Name: N/A    Number of Children: N/A  . Years of Education: N/A   Social History Main Topics  . Smoking status: Former Smoker -- 27 years    Types: Cigarettes    Quit date: 10/10/1981  . Smokeless tobacco: Never Used  . Alcohol Use: No  . Drug Use: No  . Sexually Active: None   Other Topics Concern  . None   Social History Narrative  . None     REVIEW OF SYSTEMS - PERTINENT POSITIVES ONLY: Denies change in bowel habits. Intermittent pain with gradual enlargement of the bulge at the level of the umbilicus. No prior abdominal surgery.  EXAM: Filed Vitals:   01/02/13 0930  BP: 120/70  Pulse: 91  Temp: 97.2 F (36.2 C)  Resp: 22    HEENT: normocephalic; pupils equal and reactive; sclerae clear; dentition good; mucous membranes moist NECK:  symmetric on extension; no palpable anterior or posterior cervical lymphadenopathy; no supraclavicular masses; no tenderness CHEST: clear to auscultation bilaterally without rales, rhonchi, or wheezes CARDIAC: regular rate and rhythm without significant murmur; peripheral pulses are full ABDOMEN: Morbidly obese; soft without distension; bowel sounds present; no mass; no hepatosplenomegaly; obvious bulge just below into the left of the umbilicus consistent with hernia; palpation reveals mild tenderness; partially reducible but not fully reducible EXT:  non-tender without edema; no deformity NEURO: no gross focal deficits; no sign of tremor   LABORATORY RESULTS: See [facility] (CHL-Epic) for most recent results   RADIOLOGY RESULTS: See [facility] (CHL-Epic) for most recent results   IMPRESSION: #1 incarcerated ventral hernia, symptomatic #2  insulin-dependent diabetes #3 hypertension #4 morbid obesity  PLAN: I discussed the above findings with the patient and her family. I believe she needs to undergo operative repair in the near future. We discussed open technique versus laparoscopic technique. I feel she would be a good candidate for laparoscopic surgery with a large overlap of mesh around the fascial defect. We discussed the risk and benefits of the procedure including the possibility of open surgery and the possibility of recurrence. I explained that her risk of recurrence of her hernia was approximately 5-10%. We discussed the hospital stay to be anticipated and the restrictions on her activities following the procedure. We discussed the need to wear an abdominal binder for approximately 6 weeks. She understands and wishes to proceed with surgery in the near future.  The risks and benefits of the procedure have been discussed at length with the patient.  The patient understands the proposed procedure, potential alternative treatments, and the course of recovery to be expected.  All of the patient's questions have been answered at this time.  The patient wishes to proceed with surgery.  Earnstine Regal, MD, [facility]   Visit Diagnoses: 1. Incarcerated ventral hernia     Primary Care Physician: Donnajean Lopes, MD

## 2013-01-08 NOTE — Progress Notes (Signed)
Dr. Lissa Hoard notified of elevated CBG  He will treat pt in holding area

## 2013-01-08 NOTE — Anesthesia Postprocedure Evaluation (Signed)
Anesthesia Post Note  Patient: Jean Mcclain  Procedure(s) Performed: Procedure(s) (LRB): LAPAROSCOPIC VENTRAL HERNIA (N/A) INSERTION OF MESH (N/A)  Anesthesia type: General  Patient location: PACU  Post pain: Pain level controlled  Post assessment: Post-op Vital signs reviewed  Last Vitals: BP 113/54  Pulse 60  Temp(Src) 36.1 C (Oral)  Resp 14  SpO2 98%  Post vital signs: Reviewed  Level of consciousness: sedated  Complications: No apparent anesthesia complications

## 2013-01-09 ENCOUNTER — Encounter (HOSPITAL_COMMUNITY): Payer: Self-pay | Admitting: Surgery

## 2013-01-09 LAB — GLUCOSE, CAPILLARY
Glucose-Capillary: 213 mg/dL — ABNORMAL HIGH (ref 70–99)
Glucose-Capillary: 181 mg/dL — ABNORMAL HIGH (ref 70–99)
Glucose-Capillary: 222 mg/dL — ABNORMAL HIGH (ref 70–99)
Glucose-Capillary: 182 mg/dL — ABNORMAL HIGH (ref 70–99)

## 2013-01-09 MED ORDER — DIPHENHYDRAMINE HCL 25 MG PO CAPS
25.0000 mg | ORAL_CAPSULE | Freq: Four times a day (QID) | ORAL | Status: DC | PRN
Start: 2013-01-09 — End: 2013-01-11
  Administered 2013-01-10: 25 mg via ORAL
  Filled 2013-01-09: qty 1

## 2013-01-09 MED ORDER — OXYCODONE-ACETAMINOPHEN 5-325 MG PO TABS
1.0000 | ORAL_TABLET | ORAL | Status: DC | PRN
Start: 2013-01-09 — End: 2013-01-11
  Administered 2013-01-09 – 2013-01-10 (×5): 2 via ORAL
  Filled 2013-01-09 (×6): qty 2

## 2013-01-09 NOTE — Op Note (Signed)
NAMEBERNARDETTE, Jean Mcclain              ACCOUNT NO.:  [id number]  MEDICAL RECORD NO.:  [id number]  LOCATION:  [id number]                         FACILITY:  [facility]  PHYSICIAN:  Earnstine Regal, MD      DATE OF BIRTH:  1946-06-16  DATE OF PROCEDURE:  01/08/2013                              OPERATIVE REPORT   PREOPERATIVE DIAGNOSIS:  Incarcerated ventral hernia.  POSTOPERATIVE DIAGNOSIS:  Incarcerated ventral hernia.  PROCEDURE:  Laparoscopic repair of incarcerated ventral hernia with Bard Ventralight ST mesh (15 cm).  SURGEON:  Earnstine Regal, MD, FACS  ASSISTANT:  Ralene Ok, MD  ANESTHESIA:  General.  ESTIMATED BLOOD LOSS:  Minimal.  PREPARATION:  ChloraPrep.  COMPLICATIONS:  None.  INDICATIONS:  The patient is a 67 year old white female referred by her primary physician, Dr. Leanna Battles with incarcerated ventral hernia. This had been initially diagnosed on CT scan in June 2011.  It contained incarcerated fat at that point.  Over the past 6 months, the patient has noted enlargement of the bulge at the level of the umbilicus and has developed intermittent pain.  CT scan on December 12, 2012 shows incarcerated transverse colon.  She now comes to Surgery for repair.  BODY OF REPORT:  Procedures done in OR #11 at the [facility].  The patient was brought to the operating room, placed in supine position on the operating room table.  Following administration of general anesthesia, the patient was prepped and draped in the usual aseptic fashion.  After ascertaining that an adequate level of anesthesia had been achieved, the peritoneal cavity was accessed through the left upper quadrant using an Optiview trocar.  After accessing the peritoneal cavity, the abdomen was insufflated with carbon dioxide. Laparoscope was introduced and the abdomen explored.  Operative ports were placed in the left lower quadrant, right lower quadrant, and right upper quadrant under direct  vision.  There was adherent omentum to the anterior abdominal wall at the level of the umbilicus and in the midline.  This was gently taken down with blunt dissection and limited sharp dissection.  The fascial defect measured approximately 2.5 to 3 cm in diameter.  It was located just beneath the umbilicus.  It appeared to contain incarcerated omentum and probably a small segment of transverse colon.  With a combination of gentle blunt dissection and sharp dissection, the contents of the hernia sac were delivered back within the peritoneal cavity without any evidence of bowel injury.  The entire fascial plane was cleared.  The transverse colon and omentum were inspected for both hemostasis and absence of any evidence of bowel injury.  Next, a 15 cm in diameter Bard Ventralight ST mesh patch was selected. It was prepared by placing six #1 Novafil sutures around the periphery of the mesh.  Mesh was then moistened, rolled, and inserted into the peritoneal cavity where it was deployed and properly oriented.  Sutures were retrieved using an EndoCatch device.  Mesh was pulled taut against the anterior abdominal wall and all sutures tied securely.  Mesh was then secured to the anterior abdominal wall using a SecureStrap tacking device with 2 concentric rows of tacks around the periphery of the mesh.  There appears to be good approximation with broad overlap of the hernia defect.  Good hemostasis was noted.  Pneumoperitoneum was released and ports were removed under direct vision.  Good hemostasis was noted at all port sites.  Pneumoperitoneum was evacuated.  Port sites were anesthetized with local anesthetic.  Skin incisions were closed with interrupted 4-0 Monocryl subcuticular sutures.  Wounds were washed and dried and benzoin and Steri-Strips were applied.  Sterile dressings were applied.  The patient was awakened from anesthesia and brought to the recovery room.  The patient tolerated the  procedure well.   Earnstine Regal, MD, [facility] Office: [phone]    TMG/MEDQ  D:  01/08/2013  T:  01/09/2013  Job:  [id number]  cc:   Ermalene Searing. Philip Aspen, M.D. Fax: [phone]

## 2013-01-09 NOTE — Care Management Note (Addendum)
    Page 1 of 2   01/11/2013     1:31:27 PM   CARE MANAGEMENT NOTE 01/11/2013  Patient:  Jean Mcclain, Jean Mcclain   Account Number:  [id number]  Date Initiated:  01/09/2013  Documentation initiated by:  Sunday Spillers  Subjective/Objective Assessment:   67 yo female admitted s/p hernia repair. PTA lived at home alone.     Action/Plan:   SNF for rehab   Anticipated DC Date:  01/11/2013   Anticipated DC Plan:  [place]  In-house referral  Clinical Social Worker      DC Forensic scientist  CM consult      [facility] Choice  [place]   Choice offered to / List presented to:  C-1 Patient   DME arranged  [facility]  3-N-1      DME agency  [place] arranged  [place].   Status of service:  Completed, signed off Medicare Important Message given?   (If response is "NO", the following Medicare IM given date fields will be blank) Date Medicare IM given:   Date Additional Medicare IM given:    Discharge Disposition:  [place]  Per UR Regulation:  Reviewed for med. necessity/level of care/duration of stay  If discussed at [place] of Stay Meetings, dates discussed:    Comments:  01-11-13 Sunday Spillers RN CM 1000 Spoke with patient this am with CSW present. Patient now states she will d/c home with her brother. Will arrange [facility] PT/OT, DME, patient has no preference of [place] agency.  01-10-13 Sunday Spillers RN CM 1200 Referral recieved for assessment of d/c needs. Spoke with patient at bedside, she states she lives alone and will be unable to care for self at home due to decreased mobility and pain issues. Requesting help at home to assist with ADL's. Discussed with patient services covered by insurance and custodial services that would be out of pocket expenses. Patient does not feel she has the mobility needed to take care of herself at home and request SNF for  rehab. Contacted CSW to arrange. Will f/u with patient once SNF offers provided.

## 2013-01-09 NOTE — Progress Notes (Addendum)
RT consult performed per md rx for cpap/bipap use.  Pt seen earlier in shift but did not want to try cpap until now.  Pt stated she has a cpap machine at home but has not  wore it in a long time.  Also, that she has a nasal mask at home but is not sure of her cpap settings.  Nasal cpap placed on pt at 10cm h2o with 2l o2 bleedin.  Pt appears to be tolerating well at this time.  RN notified.  Pt stated it "feels fine".  Pt remains on a continuous pulseox at bedside.  HR 67, sats98%.  Sterile water added to fill line of humidity chamber.

## 2013-01-09 NOTE — Progress Notes (Signed)
Gave patient 2 tablets of Norco around 1945.  Re-evaluated an hour later, pain did not decrease, gave 1 mg of Dilaudid, pain was tolerable at a 2/10. Asked her if she had taken percocet and oxycodone to help with pain.  Pt said she did, and that they do not help with her pain.  She also does not know of an oral pain medication that has helped her in the past.  Will continue to monitor.  Jean Mcclain

## 2013-01-09 NOTE — Progress Notes (Signed)
Pt complained of itching around her abdomen while ambulating.  Pt was wearing abdominal binder.  Had her take it off just while she is in bed, told her she has to wear it with any major movement.  Pt said itching improved.  Began to give Dilaudid 1mg  this morning and patient said she was itching like crazy along her entire trunk.  Suspect allergy to Dilaudid.  Attempted to call [facility], unable to get a hold of pager system.  Will attempt to page again.  Pt is stable, open airway, on continuous pulse ox, and having no other signs of allergic reaction.  Will continue to monitor.  Roselind Rily

## 2013-01-09 NOTE — Progress Notes (Signed)
Patient ID: Jean Mcclain, female   DOB: 1946/06/02, 67 y.o.   MRN: [id number]  [facility] - Progress Note  POD# 1  Subjective: Patient with moderate pain.  Has not been OOB today.  Tolerating diet.  Foley still in.  Objective: Vital signs in last 24 hours: Temp:  [97.5 F (36.4 C)-99.1 F (37.3 C)] 99.1 F (37.3 C) (04/02 1000) Pulse Rate:  [58-83] 83 (04/02 1000) Resp:  [14-20] 18 (04/02 1000) BP: (100-126)/(48-82) 119/48 mmHg (04/02 1000) SpO2:  [96 %-98 %] 96 % (04/02 1000) Weight:  [223 lb 12.8 oz (101.515 kg)] 223 lb 12.8 oz (101.515 kg) (04/01 1442)    Intake/Output from previous day: 04/01 0701 - 04/02 0700 In: 3995.8 [P.O.:1560; I.V.:2135.8; IV Piggyback:300] Out: 2140 [Urine:2140]  Exam: HEENT - clear, not icteric Neck - soft Chest - clear bilaterally Cor - RRR, no murmur Abd - soft, dressings dry and intact; binder off! Ext - no significant edema Neuro - grossly intact, no focal deficits  Lab Results:  No results found for this basename: WBC, HGB, HCT, PLT,  in the last 72 hours  No results found for this basename: NA, K, CL, CO2, GLUCOSE, BUN, CREATININE, CALCIUM,  in the last 72 hours  Studies/Results: No results found.  Assessment / Plan: 1.  Status post ventral hernia repair with mesh  Needs to ambulate today  Will remove foley  Will change narcotics due to itching  Likely home tomorrow  Earnstine Regal, MD, [facility] Office: [phone]  01/09/2013

## 2013-01-10 LAB — GLUCOSE, CAPILLARY
Glucose-Capillary: 235 mg/dL — ABNORMAL HIGH (ref 70–99)
Glucose-Capillary: 218 mg/dL — ABNORMAL HIGH (ref 70–99)
Glucose-Capillary: 187 mg/dL — ABNORMAL HIGH (ref 70–99)
Glucose-Capillary: 206 mg/dL — ABNORMAL HIGH (ref 70–99)

## 2013-01-10 MED ORDER — KETOROLAC TROMETHAMINE 15 MG/ML IJ SOLN
15.0000 mg | Freq: Four times a day (QID) | INTRAMUSCULAR | Status: DC
  Administered 2013-01-10 – 2013-01-11 (×4): 15 mg via INTRAVENOUS
  Filled 2013-01-10 (×5): qty 1

## 2013-01-10 NOTE — Evaluation (Signed)
Physical Therapy Evaluation Patient Details Name: Jean Mcclain MRN: [id number] DOB: 1946/07/20 Today's Date: 01/10/2013 Time: [id number] PT Time Calculation (min): 42 min  PT Assessment / Plan / Recommendation Clinical Impression  Pt. is 67 yo female admitted 4/2/ 14 for incarcerated ventral hernia. Pt. is s/p repair. Pt. has much difficulty with pain ad bed mobility . Pt requiring max assist for supine to sit. Pt. lives alone but could stay with brother until monday. ? If pt will be able to be home alone by Monday. Discussed rec. for SNF rehab to get more independent. Pt. will think about it. Pt. will benefit from PT while ina cute care. rec. HHPT if DC to home.    PT Assessment  Patient needs continued PT services    Follow Up Recommendations  SNF    Does the patient have the potential to tolerate intense rehabilitation      Barriers to Discharge Decreased caregiver support      Equipment Recommendations  None recommended by PT    Recommendations for Other Services OT consult   Frequency Min 3X/week    Precautions / Restrictions Precautions Precaution Comments: abd. binder.   Pertinent Vitals/Pain 10 abd. For bed mobility, decr. With ambulation.      Mobility  Bed Mobility Bed Mobility: Rolling Right;Right Sidelying to Sit Rolling Right: 2: Max assist;With rail Right Sidelying to Sit: HOB elevated;2: Max assist Details for Bed Mobility Assistance: Pt. had much difficulty rolling due to pain and body habitus. Had significant difficulty trying to get from side to sitting and ende up back on her back. Transfers Transfers: Sit to Stand;Stand to Sit Sit to Stand: From bed;From chair/3-in-1;With armrests;With upper extremity assist;4: Min assist Stand to Sit: 4: Min assist;To chair/3-in-1;With upper extremity assist Details for Transfer Assistance: pt needs armrests to assist sit/standing. Ambulation/Gait Ambulation/Gait Assistance: 4: Min assist Ambulation Distance  (Feet): 400 Feet Assistive device: Rolling walker Ambulation/Gait Assistance Details: periodic stops for resting breaks and pain Gait Pattern: Step-through pattern Gait velocity: slow    Exercises     PT Diagnosis: Difficulty walking;Generalized weakness;Acute pain  PT Problem List: Decreased knowledge of use of DME;Decreased strength;Decreased range of motion;Decreased activity tolerance;Decreased mobility;Obesity;Decreased knowledge of precautions;Decreased safety awareness;Pain PT Treatment Interventions: DME instruction;Gait training;Stair training;Functional mobility training;Therapeutic activities;Therapeutic exercise;Patient/family education   PT Goals Acute Rehab PT Goals PT Goal Formulation: With patient Potential to Achieve Goals: Good Pt will go Supine/Side to Sit: Independently PT Goal: Supine/Side to Sit - Progress: Goal set today Pt will go Sit to Supine/Side: Independently PT Goal: Sit to Supine/Side - Progress: Goal set today Pt will go Sit to Stand: Independently PT Goal: Sit to Stand - Progress: Goal set today Pt will go Stand to Sit: Independently Pt will Ambulate: >150 feet;with modified independence;with rolling walker PT Goal: Ambulate - Progress: Goal set today Pt will Go Up / Down Stairs: 3-5 stairs;with supervision;with rail(s) PT Goal: Up/Down Stairs - Progress: Goal set today  Visit Information  Last PT Received On: 01/10/13 Assistance Needed: +1    Subjective Data  Subjective: I really want to go to my brother's. I know how NH are. Patient Stated Goal: to go home   Prior [place] Lives With: Alone Available Help at Discharge: Family Type of Home: House (brother's house.) Home Layout: Two level;1/2 bath on main level Bathroom Toilet: Standard Home Adaptive Equipment: Walker - rolling Prior Function Level of Independence: Independent Able to Take Stairs?: Yes Vocation: Retired Corporate investment banker: No difficulties  Cognition  Cognition Overall Cognitive Status: Appears within functional limits for tasks assessed/performed Arousal/Alertness: Awake/alert Orientation Level: Appears intact for tasks assessed Behavior During Session: [facility] for tasks performed    Extremity/Trunk Assessment Right Upper Extremity Assessment RUE ROM/Strength/Tone: Deficits RUE ROM/Strength/Tone Deficits: dificulty with shoulder elevation RUE Sensation: WFL - Light Touch Left Upper Extremity Assessment LUE ROM/Strength/Tone: WFL for tasks assessed Right Lower Extremity Assessment RLE ROM/Strength/Tone: [facility] for tasks assessed Left Lower Extremity Assessment LLE ROM/Strength/Tone: WFL for tasks assessed   Balance    End of Session PT - End of Session Activity Tolerance: Patient limited by pain Patient left: in chair;with call bell/phone within reach Nurse Communication: Mobility status  GP Functional Assessment Tool Used: clinical judgement Functional Limitation: Mobility: Walking and moving around Mobility: Walking and Moving Around Current Status [id number]): At least 40 percent but less than 60 percent impaired, limited or restricted Mobility: Walking and Moving Around Goal Status [phone]): 0 percent impaired, limited or restricted   [name] 01/10/2013, 5:18 PM

## 2013-01-10 NOTE — Progress Notes (Signed)
Placed pt on nasal cpap, 10cm h2o with 2l o2 bleedin.  Pt is tolerating well at this time.  RN notified.  Hr90, sats97%.  Sterile water noted in humidity chamber.

## 2013-01-10 NOTE — Progress Notes (Signed)
Patient ID: Jean Mcclain, female   DOB: Oct 24, 1945, 67 y.o.   MRN: [id number]  [facility] - Progress Note  POD# 2  Subjective: Patient complains of pain.  Ambulated yesterday, not today.  Voiding.  Tolerating diet.  Objective: Vital signs in last 24 hours: Temp:  [97.9 F (36.6 C)-99.2 F (37.3 C)] 98.3 F (36.8 C) (04/03 0558) Pulse Rate:  [82-94] 88 (04/03 0558) Resp:  [16-20] 20 (04/03 0558) BP: (119-128)/(59-73) 128/70 mmHg (04/03 0558) SpO2:  [94 %-100 %] 96 % (04/03 0558)    Intake/Output from previous day: 04/02 0701 - 04/03 0700 In: 1918.3 [P.O.:720; I.V.:1198.3] Out: 2050 [Urine:2050]  Exam: HEENT - clear, not icteric Neck - soft Chest - clear bilaterally Cor - RRR, no murmur Abd - soft, obese; BS present; dressings dry and intact; binder replaced Ext - no significant edema Neuro - grossly intact, no focal deficits  Lab Results:  No results found for this basename: WBC, HGB, HCT, PLT,  in the last 72 hours  No results found for this basename: NA, K, CL, CO2, GLUCOSE, BUN, CREATININE, CALCIUM,  in the last 72 hours  Studies/Results: No results found.  Assessment / Plan: 1.  Status post lap ventral hernia repair with mesh  Will add Toradol IV for pain control  Encourage OOB, ambulation  Will ask CM to see - home care needs, disposition?  Hopefully home tomorrow  Earnstine Regal, MD, [facility] Office: [phone]  01/10/2013

## 2013-01-10 NOTE — Progress Notes (Signed)
Clinical Social Work Department CLINICAL SOCIAL WORK PLACEMENT NOTE 01/10/2013  Patient:  Jean Mcclain, Jean Mcclain  Account Number:  [id number] Admit date:  01/08/2013  Clinical Social Worker:  Werner Lean, LCSW  Date/time:  01/10/2013 01:49 PM  Clinical Social Work is seeking post-discharge placement for this patient at the following level of care:   SKILLED NURSING   (*CSW will update this form in Epic as items are completed)   01/10/2013  Patient/family provided with [place] Department of Clinical Social Work's list of facilities offering this level of care within the geographic area requested by the patient (or if unable, by the patient's family).  01/10/2013  Patient/family informed of their freedom to choose among providers that offer the needed level of care, that participate in Medicare, Medicaid or managed care program needed by the patient, have an available bed and are willing to accept the patient.    Patient/family informed of [facility]' ownership interest in [facility], as well as of the fact that they are under no obligation to receive care at this facility.  PASARR submitted to EDS on 01/10/2013 PASARR number received from EDS on 01/10/2013  FL2 transmitted to all facilities in geographic area requested by pt/family on  01/10/2013 FL2 transmitted to all facilities within larger geographic area on   Patient informed that his/her managed care company has contracts with or will negotiate with  certain facilities, including the following:     Patient/family informed of bed offers received:   Patient chooses bed at  Physician recommends and patient chooses bed at    Patient to be transferred to  on   Patient to be transferred to facility by   The following physician request were entered in Epic:   Additional Comments:  Werner Lean LCSW [phone]

## 2013-01-10 NOTE — Progress Notes (Signed)
Clinical Social Work Department BRIEF PSYCHOSOCIAL ASSESSMENT 01/10/2013  Patient:  Jean Mcclain, Jean Mcclain     Account Number:  [id number]     Admit date:  01/08/2013  Clinical Social Worker:  Lacie Scotts  Date/Time:  01/10/2013 01:42 PM  Referred by:  Care Management  Date Referred:  01/10/2013 Referred for  SNF Placement   Other Referral:   Interview type:  Patient Other interview type:    PSYCHOSOCIAL DATA Living Status:  ALONE Admitted from facility:   Level of care:   Primary support name:  Karle Plumber Primary support relationship to patient:  SIBLING Degree of support available:   unclear    CURRENT CONCERNS Current Concerns  Post-Acute Placement   Other Concerns:    SOCIAL WORK ASSESSMENT / PLAN Pt is a 67 yr old female living at home prior to hospitalization. CSW met with pt to assist with d/c planning. Pt is concerned about returning where she lives alone. Pt requires assistance with ambulation and ADL's. Pt states she has limited family support. Pt would like to consider ST Rehab placement. SNF search has been initiated and bed offer swill be provided as received. CSW will followto assist with possible SNF placement.   Assessment/plan status:  Psychosocial Support/Ongoing Assessment of Needs Other assessment/ plan:   HH vs SNF   Information/referral to community resources:   SNF list provided.    PATIENT'S/FAMILY'S RESPONSE TO PLAN OF CARE: Pt will discuss possible ST Rehab with her family prior to making a decision regarding placement.   Werner Lean LCSW [phone]

## 2013-01-11 LAB — GLUCOSE, CAPILLARY: Glucose-Capillary: 124 mg/dL — ABNORMAL HIGH (ref 70–99)

## 2013-01-11 MED ORDER — POLYETHYLENE GLYCOL 3350 17 G PO PACK
17.0000 g | PACK | Freq: Two times a day (BID) | ORAL | Status: DC | PRN
  Filled 2013-01-11: qty 1

## 2013-01-11 MED ORDER — POLYETHYLENE GLYCOL 3350 17 G PO PACK: PACK | ORAL | Status: DC

## 2013-01-11 NOTE — Progress Notes (Signed)
Inpatient Diabetes Program Recommendations  AACE/ADA: New Consensus Statement on Inpatient Glycemic Control (2013)  Target Ranges:  Prepandial:   less than 140 mg/dL      Peak postprandial:   less than 180 mg/dL (1-2 hours)      Critically ill patients:  140 - 180 mg/dL   Reason for Visit: Hyperglycemia  Results for ASUCENA, VANCUREN (MRN :[id number]) as of 01/11/2013 09:33  Ref. Range 01/10/2013 08:29 01/10/2013 13:17 01/10/2013 17:10 01/10/2013 21:00 01/11/2013 07:47  Glucose-Capillary Latest Range: 70-99 mg/dL 235 (H) 218 (H) 187 (H) 206 (H) 124 (H)    Inpatient Diabetes Program Recommendations HgbA1C: Check HgbA1C to assess prehospital glycemic control  Note: Will continue to follow.  Thank you. Lorenda Peck, RD, LDN, CDE Inpatient Diabetes Coordinator [phone]

## 2013-01-11 NOTE — Discharge Summary (Signed)
Physician Discharge Summary [facility]  Patient ID: Jean Mcclain MRN: [place]:[id number] DOB/AGE: May 02, 1946 67 y.o.  Admit date: 01/08/2013 Discharge date: 01/11/2013  Admission Diagnoses:  Ventral hernia, incarcerated  Discharge Diagnoses:  Principal Problem:   Incarcerated ventral hernia   Discharged Condition: good  Hospital Course: patient admitted following laparoscopic ventral  Incarcerated hernia repair with mesh. Patient had significant issues with discomfort. She was treated with IV Tylenol and IV Toradol as well as narcotics. She gradually improved and became ambulatory. She is prepared for discharge home on the third postoperative day.  Consults: case Freight forwarder, social work  Significant Diagnostic Studies: none  Treatments: surgery: laparoscopic ventral hernia repair with mesh  Discharge Exam: Blood pressure 100/65, pulse 77, temperature 98.1 F (36.7 C), temperature source Oral, resp. rate 18, height 5\' 5"  (1.651 m), weight 223 lb 12.8 oz (101.515 kg), SpO2 98.00%. See progress notes  Disposition: Home with family  Discharge Orders   Future Appointments Provider Department Dept Phone   01/24/2013 10:15 AM Earnstine Regal, MD [facility], [place] [phone]   Future Orders Complete By Expires     Diet - low sodium heart healthy  As directed     Discharge instructions  As directed     Comments:      [facility], [place]  Always review your discharge instruction sheet given to you by the facility where your surgery was performed.  A  prescription for pain medication may be given to you upon discharge.  Take your pain medication as prescribed.  If narcotic pain medicine is not needed, then you may take acetaminophen (Tylenol) or ibuprofen (Advil) as needed.  Take your usually prescribed medications unless otherwise directed.  If you need a refill on your pain medication, please contact your  pharmacy.  They will contact our office to request authorization. Prescriptions will not be filled after 5 pm daily or on weekends.  You should follow a light diet the first 24 hours after arrival home, such as soup and crackers or toast.  Be sure to include plenty of fluids daily.  Resume your normal diet the day after surgery.  Most patients will experience some swelling and bruising around the surgical site.  Ice packs and reclining will help.  Swelling and bruising can take several days to resolve.   It is common to experience some constipation if taking pain medication after surgery.  Increasing fluid intake and taking a stool softener (such as Colace) will usually help or prevent this problem from occurring.  A mild laxative (Milk of Magnesia or Miralax) should be taken according to package directions if there are no bowel movements after 48 hours.  Unless discharge instructions indicate otherwise, you may remove your bandages 24-48 hours after surgery, and you may shower at that time.  You may have steri-strips (small skin tapes) in place directly over the incision.  These strips should be left on the skin for 7-10 days.  If your surgeon used skin glue on the incision, you may shower in 24 hours.  The glue will flake off over the next 2-3 weeks.  Any sutures or staples will be removed at the office during your follow-up visit.  ACTIVITIES:  You may resume regular (light) daily activities beginning the next day-such as daily self-care, walking, climbing stairs-gradually increasing activities as tolerated.  You may have sexual intercourse when it is comfortable.  Refrain from any heavy lifting or straining  until approved by your doctor.  You may drive when you are no longer taking prescription pain medication, you can comfortably wear a seatbelt, and you can safely maneuver your car and apply brakes.  You should see your doctor in the office for a follow-up appointment approximately 2-3 weeks after  your surgery.  Make sure that you call for this appointment within a day or two after you arrive home to insure a convenient appointment time.   WHEN TO CALL YOUR DOCTOR: Fever greater than 101.0 Inability to urinate Persistent nausea and/or vomiting Extreme swelling or bruising Continued bleeding from incision Increased pain, redness, or drainage from the incision  The clinic staff is available to answer your questions during regular business hours.  Please don't hesitate to call and ask to speak to one of the nurses for clinical concerns.  If you have a medical emergency, go to the nearest emergency room or call 911.  A surgeon from [facility] is always on call for the hospital.   [facility] [address], [place], [place], [place]  [address]  [phone] ? [phone] ? FAX [phone]  [email or web address]    Increase activity slowly  As directed     No dressing needed  As directed     Scheduling Instructions:      Leave steristrips one week.  Wear binder.        Medication List    TAKE these medications       acetaminophen 500 MG tablet  Commonly known as:  TYLENOL  Take 1,000 mg by mouth every 6 (six) hours as needed for pain.     ergocalciferol 50000 UNITS capsule  Commonly known as:  VITAMIN D2  Take 50,000 Units by mouth once a week. Patient takes on Fridays     ferrous sulfate 325 (65 FE) MG tablet  Take 325 mg by mouth 2 (two) times daily.     HUMULIN N PEN 100 UNIT/ML injection  Generic drug:  insulin NPH  Inject 25 Units into the skin 2 (two) times daily.     insulin aspart 100 UNIT/ML injection  Commonly known as:  novoLOG  Inject 14 Units into the skin 2 (two) times daily before a meal.     INVOKANA 100 MG Tabs  Generic drug:  Canagliflozin  Take 100 mg by mouth daily before breakfast.     lovastatin 40 MG tablet  Commonly known as:  MEVACOR  Take 40 mg by mouth at bedtime.      olmesartan-hydrochlorothiazide 40-12.5 MG per tablet  Commonly known as:  BENICAR HCT  Take 1 tablet by mouth daily before breakfast.     omeprazole 20 MG capsule  Commonly known as:  PRILOSEC  Take 20 mg by mouth daily.     rOPINIRole 4 MG tablet  Commonly known as:  REQUIP  Take 4 mg by mouth 2 (two) times daily.         Earnstine Regal, MD, [facility] Office: [phone]   Signed: Earnstine Regal 01/11/2013, 11:16 AM

## 2013-01-11 NOTE — Progress Notes (Signed)
General Surgery [facility]  Agree.  See discharge note.  Earnstine Regal, MD, [facility] Office: [phone]

## 2013-01-11 NOTE — Progress Notes (Signed)
CSW met with pt yesterday afternoon and bed offers were provided. Pt stated she was hoping to d/c to brothers home for the weekend but would consider ST Rehab. CSW will met with pt this am to continue assisting with d/c planning.  Werner Lean LCSW [phone]

## 2013-01-11 NOTE — Progress Notes (Signed)
3 Days Post-Op  Subjective: More comfortable here but medically ready to go home.  Objective: Vital signs in last 24 hours: Temp:  [97.9 F (36.6 C)-98.1 F (36.7 C)] 98.1 F (36.7 C) (04/04 0545) Pulse Rate:  [77-89] 77 (04/04 0545) Resp:  [18] 18 (04/04 0545) BP: (100-122)/(65-72) 100/65 mmHg (04/04 0545) SpO2:  [98 %-100 %] 98 % (04/04 0545)   600 ml PO, low Na, carb mod diet, afebrile, VSS, no labs glucose 124-235 range. Intake/Output from previous day: 04/03 0701 - 04/04 0700 In: 1804.2 [P.O.:600; I.V.:1204.2] Out: 1100 [Urine:1100] Intake/Output this shift:    General appearance: alert, cooperative, no distress and cannot see her incisions GI: soft, tender, very large binder in place.  tolerating po's well, concerned about no BM so far.  Lab Results:  No results found for this basename: WBC, HGB, HCT, PLT,  in the last 72 hours  BMET No results found for this basename: NA, K, CL, CO2, GLUCOSE, BUN, CREATININE, CALCIUM,  in the last 72 hours PT/INR No results found for this basename: LABPROT, INR,  in the last 72 hours  No results found for this basename: AST, ALT, ALKPHOS, BILITOT, PROT, ALBUMIN,  in the last 168 hours   Lipase     Component Value Date/Time   LIPASE 65* 08/13/2007 1946     Studies/Results: No results found.  Medications: . hydrochlorothiazide  12.5 mg Oral Daily   And  . irbesartan  300 mg Oral Daily  . insulin aspart  0-15 Units Subcutaneous TID WC  . insulin aspart  0-5 Units Subcutaneous QHS  . insulin aspart  14 Units Subcutaneous BID AC  . insulin NPH  25 Units Subcutaneous BID  . ketorolac  15 mg Intravenous Q6H  . pantoprazole  40 mg Oral Daily  . rOPINIRole  4 mg Oral BID    Assessment/Plan Ventral hernia, incarcerated, s/p laparoscopic ventral hernia repair with mesh, 01/09/13 AODM GERD OSA  Hypertension Hyperlipidemia Body mass index is 37.2   Plan:  Offered SNF, but is going home with family.  She would like home PT.  Add miralax prn for constipation    LOS: 3 days    Raunak Antuna 01/11/2013

## 2013-01-14 ENCOUNTER — Telehealth (INDEPENDENT_AMBULATORY_CARE_PROVIDER_SITE_OTHER): Payer: Self-pay | Admitting: *Deleted

## 2013-01-14 ENCOUNTER — Telehealth (INDEPENDENT_AMBULATORY_CARE_PROVIDER_SITE_OTHER): Payer: Self-pay

## 2013-01-14 NOTE — Telephone Encounter (Signed)
LMOM for pt to call with PO status and to confirm appt for follow up.

## 2013-01-14 NOTE — Telephone Encounter (Signed)
Patient called to ask about a home health nurse to come in to help her bath herself and help around the house.  Patient states she is unable to move due to the amount of pain.  Spoke in length with patient regarding not getting up and moving around and that being a cause of increased pain.  Encouraged patient that if she doesn't wish to take the narcotic to try Ibuprofen 600mg  q6hrs.  Patient also given follow up appt date.  Explained to patient that there was not a medical necessity for the home health nurse so we could set it up but it would be a private pay.  Patient states understanding and agreeable to try the Ibuprofen at this time.

## 2013-01-15 NOTE — Progress Notes (Signed)
Physical Therapy Treatment/ Late entry for 01/11/13 treatment. Patient Details Name: Jean Mcclain MRN: [place]:[id number] DOB: February 05, 1946 Today's Date: 01/15/2013 Time: [id number] on 01/11/13 PT Time Calculation (min): 30 min  PT Assessment / Plan / Recommendation Comments on Treatment Session   Pt, improving. States wants to DC home. MD has not been in.    Follow Up Recommendations  Home health PT;Supervision/Assistance - 24 hour     Does the patient have the potential to tolerate intense rehabilitation     Barriers to Discharge        Equipment Recommendations  Rolling walker with 5" wheels    Recommendations for Other Services    Frequency Min 3X/week   Plan      Precautions / Restrictions Precautions Precaution Comments: abd. binder.   Pertinent Vitals/Pain 4-5 abd. Pain.    Mobility  Transfers Sit to Stand: With armrests;From chair/3-in-1;4: Min guard Stand to Sit: 5: Supervision;With armrests;To chair/3-in-1 Details for Transfer Assistance: some effort to get from back of recliner to sit up. Ambulation/Gait Ambulation/Gait Assistance: 5: Supervision Ambulation Distance (Feet): 400 Feet Assistive device: Rolling walker Gait Pattern: Step-through pattern Stairs: Yes Stairs Assistance: 4: Min guard Stair Management Technique: One rail Right;Sideways Number of Stairs: 2    Exercises     PT Diagnosis:    PT Problem List:   PT Treatment Interventions:     PT Goals Acute Rehab PT Goals Potential to Achieve Goals: Good Pt will go Sit to Stand: Independently PT Goal: Sit to Stand - Progress: Progressing toward goal Pt will go Stand to Sit: Independently PT Goal: Stand to Sit - Progress: Progressing toward goal Pt will Ambulate: >150 feet;with modified independence;with rolling walker PT Goal: Ambulate - Progress: Progressing toward goal Pt will Go Up / Down Stairs: 3-5 stairs;with supervision;with rail(s) PT Goal: Up/Down Stairs - Progress: Progressing toward  goal  Visit Information  Last PT Received On: 01/11/13 Assistance Needed: +1    Subjective Data  Subjective: I hope I can stay at my brothers.   Cognition  Cognition Overall Cognitive Status: Appears within functional limits for tasks assessed/performed    Balance     End of Session PT - End of Session Activity Tolerance: Patient tolerated treatment well Patient left: in chair;with call bell/phone within reach Nurse Communication: Mobility status   GP     [name] 01/15/2013, 12:22 PMLate entry note [name] PT [phone]

## 2013-01-17 ENCOUNTER — Telehealth (INDEPENDENT_AMBULATORY_CARE_PROVIDER_SITE_OTHER): Payer: Self-pay | Admitting: *Deleted

## 2013-01-17 NOTE — Telephone Encounter (Signed)
Jean Mcclain who is PT with [facility] called to request home health for patient to help with her ADL's due to patients diabetes and obesity.  Encouraged [name] to speak with patients PMD due to the reasons being medical rather than surgical.  [name] states she will call PMD today to speak with them.

## 2013-01-24 ENCOUNTER — Encounter (INDEPENDENT_AMBULATORY_CARE_PROVIDER_SITE_OTHER): Payer: Self-pay | Admitting: Surgery

## 2013-01-24 ENCOUNTER — Ambulatory Visit (INDEPENDENT_AMBULATORY_CARE_PROVIDER_SITE_OTHER): Payer: Medicare Other | Admitting: Surgery

## 2013-01-24 VITALS — BP 144/82 | HR 80 | Temp 97.3°F | Resp 22 | Ht 60.0 in | Wt 217.8 lb

## 2013-01-24 DIAGNOSIS — K436 Other and unspecified ventral hernia with obstruction, without gangrene: Secondary | ICD-10-CM

## 2013-01-24 NOTE — Patient Instructions (Signed)
  COCOA BUTTER & VITAMIN E CREAM  ([employer] or other brand)  Apply cocoa butter/vitamin E cream to your incision 2 - 3 times daily.  Massage cream into incision for one minute with each application.  Use sunscreen (50 SPF or higher) for first 6 months after surgery if area is exposed to sun.  You may substitute Mederma or other scar reducing creams as desired.   

## 2013-01-24 NOTE — Progress Notes (Signed)
General Surgery [facility]  Visit Diagnoses: 1. Incarcerated ventral hernia     HISTORY: Patient returns for her first postoperative visit having undergone laparoscopic ventral hernia repair with mesh on 01/08/2013. Postoperative course has been relatively uneventful. She continues to have moderate discomfort. Her appetite is good. Bowel function is normal.  EXAM: Surgical incisions are healing fine. Steri-Strips are removed. At the umbilicus there is some induration and a moderate sized seroma. There does not appear to be any infection or recurrence of her hernia.  IMPRESSION: Status post laparoscopic ventral hernia repair  PLAN: Patient will begin applying topical creams to her incisions into the skin on the anterior abdominal wall. I removed all of the Steri-Strips today. She will wear her abdominal binder during the daytime but may take it off in the evenings and at night. She will return for wound check in 2 weeks.  Earnstine Regal, MD, [facility]

## 2013-02-04 ENCOUNTER — Encounter (INDEPENDENT_AMBULATORY_CARE_PROVIDER_SITE_OTHER): Payer: Self-pay

## 2013-02-06 ENCOUNTER — Encounter (INDEPENDENT_AMBULATORY_CARE_PROVIDER_SITE_OTHER): Payer: Self-pay | Admitting: Surgery

## 2013-02-06 ENCOUNTER — Ambulatory Visit (INDEPENDENT_AMBULATORY_CARE_PROVIDER_SITE_OTHER): Payer: Medicare Other | Admitting: Surgery

## 2013-02-06 VITALS — BP 110/60 | HR 93 | Temp 97.1°F | Resp 18 | Ht 60.0 in | Wt 213.0 lb

## 2013-02-06 DIAGNOSIS — K436 Other and unspecified ventral hernia with obstruction, without gangrene: Secondary | ICD-10-CM

## 2013-02-06 NOTE — Progress Notes (Signed)
General Surgery [facility]  Visit Diagnoses: 1. Incarcerated ventral hernia     HISTORY: Patient returns for postoperative visit having undergone laparoscopic ventral hernia repair with mesh.  She has stopped wearing the abdominal binder. She is eating normally. Bowels are slightly soft. No nausea or vomiting. Pain has improved and she is no longer taking pain medication.  EXAM: Surgical incisions have healed nicely without sign of infection. Slight erythema at umbilicus with a small to moderate sized seroma. With Valsalva there is no sign of recurrent hernia.  IMPRESSION: Status post laparoscopic ventral hernia repair with mesh  PLAN: Patient will begin applying topical creams all of her incisions. I've asked her to apply a moisturizing cream around the umbilicus. I have asked her to wear her abdominal binder if it is not too uncomfortable. She may resume driving at this point. She will return to see me in 6 weeks for wound check.  Earnstine Regal, MD, [facility]

## 2013-02-06 NOTE — Patient Instructions (Signed)
  COCOA BUTTER & VITAMIN E CREAM  ([employer] or other brand)  Apply cocoa butter/vitamin E cream to your incision 2 - 3 times daily.  Massage cream into incision for one minute with each application.  Use sunscreen (50 SPF or higher) for first 6 months after surgery if area is exposed to sun.  You may substitute Mederma or other scar reducing creams as desired.   

## 2013-03-18 ENCOUNTER — Other Ambulatory Visit (HOSPITAL_COMMUNITY): Payer: Self-pay | Admitting: Internal Medicine

## 2013-03-18 DIAGNOSIS — Z1231 Encounter for screening mammogram for malignant neoplasm of breast: Secondary | ICD-10-CM

## 2013-03-25 ENCOUNTER — Encounter (INDEPENDENT_AMBULATORY_CARE_PROVIDER_SITE_OTHER): Payer: Self-pay | Admitting: Surgery

## 2013-03-25 ENCOUNTER — Ambulatory Visit (INDEPENDENT_AMBULATORY_CARE_PROVIDER_SITE_OTHER): Payer: Medicare Other | Admitting: Surgery

## 2013-03-25 VITALS — BP 128/70 | HR 87 | Temp 97.6°F | Resp 16 | Ht 60.0 in | Wt 215.4 lb

## 2013-03-25 DIAGNOSIS — K436 Other and unspecified ventral hernia with obstruction, without gangrene: Secondary | ICD-10-CM

## 2013-03-25 NOTE — Progress Notes (Signed)
General Surgery [facility]  Visit Diagnoses: 1. Incarcerated ventral hernia     HISTORY: Patient returns for final postoperative visit having undergone laparoscopic ventral hernia repair with mesh. She continues to improve. Her mobility is good. She denies any significant pain. The seroma beneath the umbilicus continues to resolve.  EXAM: Abdominal incisions are well-healed. Examined in the standing position with cough and Valsalva there is no sign of recurrent hernia. The small seroma beneath the umbilicus now measures approximately 2 cm in greatest dimension.  IMPRESSION: Status post laparoscopic ventral hernia repair with mesh  PLAN: Patient is released to full activity without restriction. She will return for surgical care as needed.  Earnstine Regal, MD, [facility]

## 2013-03-25 NOTE — Patient Instructions (Signed)
  COCOA BUTTER & VITAMIN E CREAM  ([employer] or other brand)  Apply cocoa butter/vitamin E cream to your incision 2 - 3 times daily.  Massage cream into incision for one minute with each application.  Use sunscreen (50 SPF or higher) for first 6 months after surgery if area is exposed to sun.  You may substitute Mederma or other scar reducing creams as desired.   

## 2013-03-26 ENCOUNTER — Ambulatory Visit (HOSPITAL_COMMUNITY)
Admission: RE | Admit: 2013-03-26 | Discharge: 2013-03-26 | Disposition: A | Discharge status: Home / Self Care | Payer: Medicare Other | Source: Ambulatory Visit | Attending: Internal Medicine | Admitting: Internal Medicine

## 2013-03-26 DIAGNOSIS — Z1231 Encounter for screening mammogram for malignant neoplasm of breast: Secondary | ICD-10-CM | POA: Insufficient documentation

## 2014-03-05 ENCOUNTER — Other Ambulatory Visit (HOSPITAL_COMMUNITY): Payer: Self-pay | Admitting: Internal Medicine

## 2014-03-05 DIAGNOSIS — Z1231 Encounter for screening mammogram for malignant neoplasm of breast: Secondary | ICD-10-CM

## 2014-03-27 ENCOUNTER — Ambulatory Visit (HOSPITAL_COMMUNITY)
Admission: RE | Admit: 2014-03-27 | Discharge: 2014-03-27 | Disposition: A | Discharge status: Home / Self Care | Payer: Medicare HMO | Source: Ambulatory Visit | Attending: Internal Medicine | Admitting: Internal Medicine

## 2014-03-27 DIAGNOSIS — Z1231 Encounter for screening mammogram for malignant neoplasm of breast: Secondary | ICD-10-CM | POA: Insufficient documentation

## 2014-05-21 ENCOUNTER — Encounter: Payer: Self-pay | Admitting: *Deleted

## 2014-05-21 ENCOUNTER — Encounter: Payer: Medicare HMO | Attending: Endocrinology | Admitting: *Deleted

## 2014-05-21 VITALS — Ht 60.0 in | Wt 216.5 lb

## 2014-05-21 DIAGNOSIS — E119 Type 2 diabetes mellitus without complications: Secondary | ICD-10-CM

## 2014-05-21 DIAGNOSIS — Z713 Dietary counseling and surveillance: Secondary | ICD-10-CM | POA: Insufficient documentation

## 2014-05-21 DIAGNOSIS — Z794 Long term (current) use of insulin: Secondary | ICD-10-CM | POA: Diagnosis not present

## 2014-05-21 NOTE — Progress Notes (Signed)
Appt start time: 1400 end time:  1530.  Assessment:  Patient was seen on  05/21/14 for individual diabetes education. Lives alone, buys and prepares her own meals. History of Diabetes since 1999, states no previous diabetes education. SMBG 3 times a day with reported range of 87 - 253 since seeing Dr. Chalmers Cater. Was in 400-500 range prior to seeing her. She is currently in [facility] so she is using ReliOn N and R insulin for basal and bolus insulin. No exercise at this time. She complains of neuropathy, blurred vision, increased thirst and occasionally polyuria.   Patient Education Plan per assessed needs and concerns is to attend individual session for Diabetes Self Management Education.  Current HbA1c: 13.8%  Preferred Learning Style:   No preference indicated   Learning Readiness:   Ready  Change in progress  MEDICATIONS: see list. Diabetes medications are ReliOn N and R  DIETARY INTAKE:  24-hr recall:  B ( AM): egg, ricotta cheese, smoothie made from 2 small peaches, and 1 banana and 4 oz 1% milk.  Snk ( AM): no  L ( PM): skips or eats later - 2 chicken breast stuffed with broccoli and added vegetable, flavored water or diet green tea Snk ( PM): no D ( PM): with friends often, sandwich OR salad with lean meat added, occasionally crackers at [place] or at their house. Snk ( PM): hungry in evening time- cream cheese on crackers OR whole bagel with cream cheese and lox. Beverages: milk, water, green tea, flavored water  Usual physical activity: not now, in pain with neuropathy  Estimated energy needs: 1400 calories 158 g carbohydrates 105 g protein 39 g fat  Progress Towards Goal(s):  In progress.   Nutritional Diagnosis:  NB-1.1 Food and nutrition-related knowledge deficit As related to Diabetes.  As evidenced by A1c of 13.8%.    Intervention:  Nutrition counseling provided.  Discussed diabetes disease process and treatment options.  Discussed physiology of diabetes  and role of obesity on insulin resistance.  Encouraged moderate weight reduction to improve glucose levels.    Provided education on macronutrients on glucose levels.  Provided education on carb counting, importance of regularly scheduled meals/snacks, and meal planning  Discussed effects of physical activity on glucose levels and long-term glucose control.  Recommended alternate forms of physical activity/week due to pain from neuropathy such as pool exercises  Reviewed patient medications.  Discussed role of medication on blood glucose and possible side effects  Discussed blood glucose monitoring and interpretation.  Discussed recommended target ranges and individual ranges.  Suggested she check BG after pool exercises  Described short-term complications: hyper- and hypo-glycemia.  Discussed causes,symptoms, and treatment options.  Discussed prevention, detection, and treatment of long-term complications.  Discussed the role of prolonged elevated glucose levels on body systems.  Discussed role of stress on blood glucose levels and discussed strategies to manage psychosocial issues.  Discussed recommendations for long-term diabetes self-care.  Provided checklist for medical, dental, and emotional self-care.  Plan:  Aim for 3 Carb Choices per meal (45 grams) +/- 1 either way  Aim for 0-1 Carbs per snack if hungry  Include protein in moderation with your meals and snacks Consider reading food labels for Total Carbohydrate of foods Consider  increasing your activity level by Arm Chair Exercises or walking in pool as tolerated Consider checking BG at alternate times per day as directed by MD   Teaching Method Utilized: Visual, Auditory and Hands on  Handouts given during visit include:  Living Well with Diabetes Carb Counting and Food Label handouts Meal Plan Card Insulin Action handout  Barriers to learning/adherence to lifestyle change: none  Diabetes self-care support plan:    [facility] support group  Demonstrated degree of understanding via:  Teach Back   Monitoring/Evaluation:  Dietary intake, exercise, SMBG, and body weight in 1 month(s).

## 2014-05-21 NOTE — Patient Instructions (Signed)
Plan:  Aim for 3 Carb Choices per meal (45 grams) +/- 1 either way  Aim for 0-1 Carbs per snack if hungry  Include protein in moderation with your meals and snacks Consider reading food labels for Total Carbohydrate of foods Consider  increasing your activity level by Arm Chair Exercises or walking in pool as tolerated Consider checking BG at alternate times per day as directed by MD

## 2014-07-02 ENCOUNTER — Ambulatory Visit: Payer: Medicare Other | Admitting: *Deleted

## 2014-07-18 ENCOUNTER — Other Ambulatory Visit: Payer: Self-pay | Admitting: Orthopedic Surgery

## 2014-07-29 ENCOUNTER — Ambulatory Visit (HOSPITAL_BASED_OUTPATIENT_CLINIC_OR_DEPARTMENT_OTHER): Admission: RE | Admit: 2014-07-29 | Payer: Medicare Other | Source: Ambulatory Visit | Admitting: Orthopedic Surgery

## 2014-07-29 ENCOUNTER — Encounter (HOSPITAL_BASED_OUTPATIENT_CLINIC_OR_DEPARTMENT_OTHER): Admission: RE | Payer: Self-pay | Source: Ambulatory Visit

## 2014-07-29 SURGERY — CARPAL TUNNEL RELEASE
Anesthesia: General | Laterality: Left

## 2014-07-31 ENCOUNTER — Ambulatory Visit: Payer: Medicare Other | Admitting: *Deleted

## 2014-10-10 HISTORY — PX: CARPAL TUNNEL RELEASE: SHX101

## 2014-11-10 HISTORY — PX: TRIGGER FINGER RELEASE: SHX641

## 2015-04-24 ENCOUNTER — Encounter (HOSPITAL_BASED_OUTPATIENT_CLINIC_OR_DEPARTMENT_OTHER): Payer: PPO | Attending: Internal Medicine

## 2015-04-24 DIAGNOSIS — R609 Edema, unspecified: Secondary | ICD-10-CM | POA: Diagnosis not present

## 2015-04-24 DIAGNOSIS — T2103XA Burn of unspecified degree of upper back, initial encounter: Secondary | ICD-10-CM | POA: Insufficient documentation

## 2015-04-24 DIAGNOSIS — X16XXXA Contact with hot heating appliances, radiators and pipes, initial encounter: Secondary | ICD-10-CM | POA: Insufficient documentation

## 2015-04-24 DIAGNOSIS — L97821 Non-pressure chronic ulcer of other part of left lower leg limited to breakdown of skin: Secondary | ICD-10-CM | POA: Insufficient documentation

## 2015-04-24 DIAGNOSIS — G4730 Sleep apnea, unspecified: Secondary | ICD-10-CM | POA: Diagnosis not present

## 2015-04-24 DIAGNOSIS — I129 Hypertensive chronic kidney disease with stage 1 through stage 4 chronic kidney disease, or unspecified chronic kidney disease: Secondary | ICD-10-CM | POA: Insufficient documentation

## 2015-04-24 DIAGNOSIS — I872 Venous insufficiency (chronic) (peripheral): Secondary | ICD-10-CM | POA: Insufficient documentation

## 2015-04-24 DIAGNOSIS — E1122 Type 2 diabetes mellitus with diabetic chronic kidney disease: Secondary | ICD-10-CM | POA: Diagnosis not present

## 2015-04-24 DIAGNOSIS — Z6841 Body Mass Index (BMI) 40.0 and over, adult: Secondary | ICD-10-CM | POA: Insufficient documentation

## 2015-04-24 DIAGNOSIS — J449 Chronic obstructive pulmonary disease, unspecified: Secondary | ICD-10-CM | POA: Insufficient documentation

## 2015-04-24 DIAGNOSIS — Z87891 Personal history of nicotine dependence: Secondary | ICD-10-CM | POA: Diagnosis not present

## 2015-04-24 DIAGNOSIS — K219 Gastro-esophageal reflux disease without esophagitis: Secondary | ICD-10-CM | POA: Insufficient documentation

## 2015-04-24 DIAGNOSIS — Z794 Long term (current) use of insulin: Secondary | ICD-10-CM | POA: Diagnosis not present

## 2015-04-24 DIAGNOSIS — E1140 Type 2 diabetes mellitus with diabetic neuropathy, unspecified: Secondary | ICD-10-CM | POA: Diagnosis not present

## 2015-04-24 DIAGNOSIS — N183 Chronic kidney disease, stage 3 (moderate): Secondary | ICD-10-CM | POA: Diagnosis not present

## 2015-05-01 DIAGNOSIS — L97821 Non-pressure chronic ulcer of other part of left lower leg limited to breakdown of skin: Secondary | ICD-10-CM | POA: Diagnosis not present

## 2015-05-01 DIAGNOSIS — I872 Venous insufficiency (chronic) (peripheral): Secondary | ICD-10-CM | POA: Diagnosis not present

## 2015-05-01 DIAGNOSIS — T2103XA Burn of unspecified degree of upper back, initial encounter: Secondary | ICD-10-CM | POA: Diagnosis not present

## 2015-09-09 ENCOUNTER — Other Ambulatory Visit: Payer: Self-pay | Admitting: Endocrinology

## 2015-09-09 DIAGNOSIS — R748 Abnormal levels of other serum enzymes: Secondary | ICD-10-CM

## 2015-09-11 ENCOUNTER — Ambulatory Visit
Admission: RE | Admit: 2015-09-11 | Discharge: 2015-09-11 | Disposition: A | Discharge status: Home / Self Care | Payer: PPO | Source: Ambulatory Visit | Attending: Endocrinology | Admitting: Endocrinology

## 2015-09-11 DIAGNOSIS — R748 Abnormal levels of other serum enzymes: Secondary | ICD-10-CM

## 2015-09-17 ENCOUNTER — Other Ambulatory Visit: Payer: Self-pay | Admitting: Gastroenterology

## 2015-09-17 DIAGNOSIS — K8689 Other specified diseases of pancreas: Secondary | ICD-10-CM

## 2015-09-27 ENCOUNTER — Ambulatory Visit
Admission: RE | Admit: 2015-09-27 | Discharge: 2015-09-27 | Disposition: A | Discharge status: Home / Self Care | Payer: PPO | Source: Ambulatory Visit | Attending: Gastroenterology | Admitting: Gastroenterology

## 2015-09-27 DIAGNOSIS — K8689 Other specified diseases of pancreas: Secondary | ICD-10-CM

## 2015-10-02 ENCOUNTER — Other Ambulatory Visit: Payer: PPO

## 2015-10-21 DIAGNOSIS — M1611 Unilateral primary osteoarthritis, right hip: Secondary | ICD-10-CM | POA: Diagnosis not present

## 2015-10-27 DIAGNOSIS — M1990 Unspecified osteoarthritis, unspecified site: Secondary | ICD-10-CM | POA: Diagnosis not present

## 2015-11-11 DIAGNOSIS — R600 Localized edema: Secondary | ICD-10-CM | POA: Diagnosis not present

## 2015-11-11 DIAGNOSIS — I739 Peripheral vascular disease, unspecified: Secondary | ICD-10-CM | POA: Diagnosis not present

## 2015-11-13 DIAGNOSIS — N183 Chronic kidney disease, stage 3 (moderate): Secondary | ICD-10-CM | POA: Diagnosis not present

## 2015-11-16 DIAGNOSIS — M1611 Unilateral primary osteoarthritis, right hip: Secondary | ICD-10-CM | POA: Diagnosis not present

## 2015-11-16 DIAGNOSIS — E668 Other obesity: Secondary | ICD-10-CM | POA: Diagnosis not present

## 2015-11-16 DIAGNOSIS — I872 Venous insufficiency (chronic) (peripheral): Secondary | ICD-10-CM | POA: Diagnosis not present

## 2015-11-16 DIAGNOSIS — Z6841 Body Mass Index (BMI) 40.0 and over, adult: Secondary | ICD-10-CM | POA: Diagnosis not present

## 2015-11-16 DIAGNOSIS — L03116 Cellulitis of left lower limb: Secondary | ICD-10-CM | POA: Diagnosis not present

## 2015-11-18 DIAGNOSIS — G4733 Obstructive sleep apnea (adult) (pediatric): Secondary | ICD-10-CM | POA: Diagnosis not present

## 2015-11-18 DIAGNOSIS — M1611 Unilateral primary osteoarthritis, right hip: Secondary | ICD-10-CM | POA: Diagnosis not present

## 2015-11-18 DIAGNOSIS — L03116 Cellulitis of left lower limb: Secondary | ICD-10-CM | POA: Diagnosis not present

## 2015-11-18 DIAGNOSIS — I872 Venous insufficiency (chronic) (peripheral): Secondary | ICD-10-CM | POA: Diagnosis not present

## 2015-11-18 DIAGNOSIS — E559 Vitamin D deficiency, unspecified: Secondary | ICD-10-CM | POA: Diagnosis not present

## 2015-11-18 DIAGNOSIS — I10 Essential (primary) hypertension: Secondary | ICD-10-CM | POA: Diagnosis not present

## 2015-11-18 DIAGNOSIS — E1151 Type 2 diabetes mellitus with diabetic peripheral angiopathy without gangrene: Secondary | ICD-10-CM | POA: Diagnosis not present

## 2015-11-19 DIAGNOSIS — Z87891 Personal history of nicotine dependence: Secondary | ICD-10-CM | POA: Diagnosis not present

## 2015-11-19 DIAGNOSIS — G4733 Obstructive sleep apnea (adult) (pediatric): Secondary | ICD-10-CM | POA: Diagnosis not present

## 2015-11-19 DIAGNOSIS — I872 Venous insufficiency (chronic) (peripheral): Secondary | ICD-10-CM | POA: Diagnosis not present

## 2015-11-19 DIAGNOSIS — L03116 Cellulitis of left lower limb: Secondary | ICD-10-CM | POA: Diagnosis not present

## 2015-11-19 DIAGNOSIS — I10 Essential (primary) hypertension: Secondary | ICD-10-CM | POA: Diagnosis not present

## 2015-11-19 DIAGNOSIS — E1140 Type 2 diabetes mellitus with diabetic neuropathy, unspecified: Secondary | ICD-10-CM | POA: Diagnosis not present

## 2015-11-19 DIAGNOSIS — E559 Vitamin D deficiency, unspecified: Secondary | ICD-10-CM | POA: Diagnosis not present

## 2015-11-19 DIAGNOSIS — M545 Low back pain: Secondary | ICD-10-CM | POA: Diagnosis not present

## 2015-11-19 DIAGNOSIS — E1151 Type 2 diabetes mellitus with diabetic peripheral angiopathy without gangrene: Secondary | ICD-10-CM | POA: Diagnosis not present

## 2015-11-19 DIAGNOSIS — M65322 Trigger finger, left index finger: Secondary | ICD-10-CM | POA: Diagnosis not present

## 2015-11-19 DIAGNOSIS — Z9181 History of falling: Secondary | ICD-10-CM | POA: Diagnosis not present

## 2015-11-19 DIAGNOSIS — M1611 Unilateral primary osteoarthritis, right hip: Secondary | ICD-10-CM | POA: Diagnosis not present

## 2015-11-19 DIAGNOSIS — Z794 Long term (current) use of insulin: Secondary | ICD-10-CM | POA: Diagnosis not present

## 2015-11-19 DIAGNOSIS — M8580 Other specified disorders of bone density and structure, unspecified site: Secondary | ICD-10-CM | POA: Diagnosis not present

## 2015-11-27 DIAGNOSIS — N183 Chronic kidney disease, stage 3 (moderate): Secondary | ICD-10-CM | POA: Diagnosis not present

## 2015-11-27 DIAGNOSIS — E119 Type 2 diabetes mellitus without complications: Secondary | ICD-10-CM | POA: Diagnosis not present

## 2015-11-27 DIAGNOSIS — I10 Essential (primary) hypertension: Secondary | ICD-10-CM | POA: Diagnosis not present

## 2015-12-02 DIAGNOSIS — G4733 Obstructive sleep apnea (adult) (pediatric): Secondary | ICD-10-CM | POA: Diagnosis not present

## 2015-12-02 DIAGNOSIS — E559 Vitamin D deficiency, unspecified: Secondary | ICD-10-CM | POA: Diagnosis not present

## 2015-12-02 DIAGNOSIS — I872 Venous insufficiency (chronic) (peripheral): Secondary | ICD-10-CM | POA: Diagnosis not present

## 2015-12-02 DIAGNOSIS — I10 Essential (primary) hypertension: Secondary | ICD-10-CM | POA: Diagnosis not present

## 2015-12-07 DIAGNOSIS — N189 Chronic kidney disease, unspecified: Secondary | ICD-10-CM | POA: Diagnosis not present

## 2015-12-07 DIAGNOSIS — I10 Essential (primary) hypertension: Secondary | ICD-10-CM | POA: Diagnosis not present

## 2015-12-07 DIAGNOSIS — E7800 Pure hypercholesterolemia, unspecified: Secondary | ICD-10-CM | POA: Diagnosis not present

## 2015-12-07 DIAGNOSIS — E1165 Type 2 diabetes mellitus with hyperglycemia: Secondary | ICD-10-CM | POA: Diagnosis not present

## 2015-12-29 ENCOUNTER — Ambulatory Visit (INDEPENDENT_AMBULATORY_CARE_PROVIDER_SITE_OTHER): Payer: PPO | Admitting: Sports Medicine

## 2015-12-29 ENCOUNTER — Encounter: Payer: Self-pay | Admitting: Sports Medicine

## 2015-12-29 VITALS — BP 145/82 | HR 96 | Resp 12

## 2015-12-29 DIAGNOSIS — B351 Tinea unguium: Secondary | ICD-10-CM | POA: Diagnosis not present

## 2015-12-29 DIAGNOSIS — M79671 Pain in right foot: Secondary | ICD-10-CM

## 2015-12-29 DIAGNOSIS — I739 Peripheral vascular disease, unspecified: Secondary | ICD-10-CM | POA: Diagnosis not present

## 2015-12-29 DIAGNOSIS — M79672 Pain in left foot: Secondary | ICD-10-CM

## 2015-12-29 DIAGNOSIS — E1142 Type 2 diabetes mellitus with diabetic polyneuropathy: Secondary | ICD-10-CM | POA: Diagnosis not present

## 2015-12-29 NOTE — Patient Instructions (Signed)
Diabetes and Foot Care Diabetes may cause you to have problems because of poor blood supply (circulation) to your feet and legs. This may cause the skin on your feet to become thinner, break easier, and heal more slowly. Your skin may become dry, and the skin may peel and crack. You may also have nerve damage in your legs and feet causing decreased feeling in them. You may not notice minor injuries to your feet that could lead to infections or more serious problems. Taking care of your feet is one of the most important things you can do for yourself.  HOME CARE INSTRUCTIONS  Wear shoes at all times, even in the house. Do not go barefoot. Bare feet are easily injured.  Check your feet daily for blisters, cuts, and redness. If you cannot see the bottom of your feet, use a mirror or ask someone for help.  Wash your feet with warm water (do not use hot water) and mild soap. Then pat your feet and the areas between your toes until they are completely dry. Do not soak your feet as this can dry your skin.  Apply a moisturizing lotion or petroleum jelly (that does not contain alcohol and is unscented) to the skin on your feet and to dry, brittle toenails. Do not apply lotion between your toes.  Trim your toenails straight across. Do not dig under them or around the cuticle. File the edges of your nails with an emery board or nail file.  Do not cut corns or calluses or try to remove them with medicine.  Wear clean socks or stockings every day. Make sure they are not too tight. Do not wear knee-high stockings since they may decrease blood flow to your legs.  Wear shoes that fit properly and have enough cushioning. To break in new shoes, wear them for just a few hours a day. This prevents you from injuring your feet. Always look in your shoes before you put them on to be sure there are no objects inside.  Do not cross your legs. This may decrease the blood flow to your feet.  If you find a minor scrape,  cut, or break in the skin on your feet, keep it and the skin around it clean and dry. These areas may be cleansed with mild soap and water. Do not cleanse the area with peroxide, alcohol, or iodine.  When you remove an adhesive bandage, be sure not to damage the skin around it.  If you have a wound, look at it several times a day to make sure it is healing.  Do not use heating pads or hot water bottles. They may burn your skin. If you have lost feeling in your feet or legs, you may not know it is happening until it is too late.  Make sure your health care provider performs a complete foot exam at least annually or more often if you have foot problems. Report any cuts, sores, or bruises to your health care provider immediately. SEEK MEDICAL CARE IF:   You have an injury that is not healing.  You have cuts or breaks in the skin.  You have an ingrown nail.  You notice redness on your legs or feet.  You feel burning or tingling in your legs or feet.  You have pain or cramps in your legs and feet.  Your legs or feet are numb.  Your feet always feel cold. SEEK IMMEDIATE MEDICAL CARE IF:   There is increasing redness,   swelling, or pain in or around a wound.  There is a red line that goes up your leg.  Pus is coming from a wound.  You develop a fever or as directed by your health care provider.  You notice a bad smell coming from an ulcer or wound.   This information is not intended to replace advice given to you by your health care provider. Make sure you discuss any questions you have with your health care provider.   Document Released: 09/23/2000 Document Revised: 05/29/2013 Document Reviewed: 03/05/2013 Elsevier Interactive Patient Education 2016 [employer] Inc[employer]  

## 2015-12-29 NOTE — Progress Notes (Deleted)
   Subjective:    Patient ID: Jean Mcclain, female    DOB: 02/02/1946, 70 y.o.   MRN: [id number]  HPI pt requesting for nail trim, diabetic shoes and diabetic feet check.   Review of Systems  Constitutional: Positive for fatigue.  Musculoskeletal: Positive for myalgias and gait problem.  All other systems reviewed and are negative.      Objective:   Physical Exam        Assessment & Plan:

## 2015-12-29 NOTE — Progress Notes (Signed)
Patient ID: Jean Mcclain, female   DOB: 1945/12/19, 71 y.o.   MRN: [id number] Subjective: Jean Mcclain is a 70 y.o. female patient with history of diabetes who presents to office today complaining of long, painful nails  while ambulating in shoes; unable to trim. Patient states that the glucose reading this morning was 226mg /dl was elevated because forgot to take medication. Patient denies any new changes in medication or new problems. Patient denies any new cramping, numbness, burning or tingling in the legs.  Patient also desires diabetic shoes.   Review of Systems  Constitutional: Positive for fatigue.  Musculoskeletal: Positive for myalgias and gait problem.  All other systems reviewed and are negative   Patient Active Problem List   Diagnosis Date Noted  . Incarcerated ventral hernia 01/02/2013   Current Outpatient Prescriptions on File Prior to Visit  Medication Sig Dispense Refill  . Alcohol Swabs (ALCOHOL PADS) 70 % PADS     . Blood Glucose Monitoring Suppl (EASY TALK BLOOD GLUCOSE SYSTEM) DEVI     . calcium carbonate (OS-CAL) 600 MG TABS tablet Take 600 mg by mouth 2 (two) times daily with a meal.    . Canagliflozin (INVOKANA) 100 MG TABS Take 100 mg by mouth daily before breakfast.     . cloNIDine (CATAPRES) 0.1 MG tablet Take 0.1 mg by mouth daily.    . cyclobenzaprine (FLEXERIL) 10 MG tablet Take 10 mg by mouth at bedtime.    Marland Kitchen EASY COMFORT LANCETS MISC     . ergocalciferol (VITAMIN D2) 50000 UNITS capsule Take 50,000 Units by mouth once a week. Patient takes on Fridays    . ferrous sulfate 325 (65 FE) MG tablet Take 325 mg by mouth 2 (two) times daily.     . furosemide (LASIX) 40 MG tablet Take 40 mg by mouth.    . gabapentin (NEURONTIN) 100 MG capsule Take 200 mg by mouth 2 (two) times daily.    Marland Kitchen HUMULIN N PEN 100 UNIT/ML injection Inject 25 Units into the skin 2 (two) times daily.     . insulin aspart (NOVOLOG) 100 UNIT/ML injection Inject 14 Units into the skin 2  (two) times daily before a meal.     . lovastatin (MEVACOR) 40 MG tablet Take 40 mg by mouth at bedtime.     Marland Kitchen olmesartan-hydrochlorothiazide (BENICAR HCT) 40-12.5 MG per tablet Take 1 tablet by mouth daily before breakfast.     . omeprazole (PRILOSEC) 20 MG capsule Take 20 mg by mouth daily.    Marland Kitchen rOPINIRole (REQUIP) 4 MG tablet Take 4 mg by mouth 2 (two) times daily.      No current facility-administered medications on file prior to visit.   No Known Allergies  No results found for this or any previous visit (from the past 2160 hour(s)).  Objective: General: Patient is awake, alert, and oriented x 3 and in no acute distress.  Integument: Skin is warm, dry and supple bilateral. Nails are tender, long, thickened and  dystrophic with subungual debris, consistent with onychomycosis, 1-5 bilateral. No signs of infection. No open lesions or preulcerative lesions present bilateral. Admits to history of callus on right 1st toe. Remaining integument unremarkable.  Vasculature:  Dorsalis Pedis pulse 1/4 bilateral. Posterior Tibial pulse  0/4 bilateral. +1pitting edema bilateral Capillary fill time <3 sec 1-5 bilateral. No hair growth to the level of the digits. Temperature gradient within normal limits. +varicosities present bilateral.  Neurology: The patient has intact sensation measured with a 5.07/10g Semmes  Weinstein Monofilament at all pedal sites bilateral . Vibratory sensation diminished bilateral with tuning fork. No Babinski sign present bilateral.   Musculoskeletal: L>R pes planus deformity with mild hammertoes noted bilateral. Muscular strength 5/5 in all lower extremity muscular groups bilateral without pain on range of motion . No tenderness with calf compression bilateral.  Assessment and Plan: Problem List Items Addressed This Visit    None    Visit Diagnoses    Dermatophytosis of nail    -  Primary    Diabetic polyneuropathy associated with type 2 diabetes mellitus (HCC)         PVD (peripheral vascular disease) ([place])        Foot pain, bilateral          -Examined patient. -Discussed and educated patient on diabetic foot care, especially with  regards to the vascular, neurological and musculoskeletal systems.  -Stressed the importance of good glycemic control and the detriment of not  controlling glucose levels in relation to the foot. -Mechanically debrided all nails 1-5 bilateral using sterile nail nipper and filed with dremel without incident  -[employer] diabetic shoe order form was completed; office to contact primary care for approval / certification;  Office to arrange shoe fitting and dispensing. -Cont with compression stockings daily for edema control -Answered all patient questions -Patient to return in 3 months for at risk foot care -Patient advised to call the office if any problems or questions arise in the meantime.  Landis Martins, DPM

## 2016-02-05 ENCOUNTER — Encounter: Payer: PPO | Admitting: Physical Medicine & Rehabilitation

## 2016-02-10 ENCOUNTER — Ambulatory Visit: Payer: PPO | Admitting: *Deleted

## 2016-02-10 VITALS — Ht 59.0 in | Wt 226.0 lb

## 2016-02-10 DIAGNOSIS — E1142 Type 2 diabetes mellitus with diabetic polyneuropathy: Secondary | ICD-10-CM

## 2016-02-10 NOTE — Progress Notes (Signed)
Patient ID: Jean Mcclain, female   DOB: 10/27/1945, 70 y.o.   MRN: [place]:[id number] Patient presents to be scanned and measured for diabetic shoes and inserts.

## 2016-03-14 ENCOUNTER — Ambulatory Visit (INDEPENDENT_AMBULATORY_CARE_PROVIDER_SITE_OTHER): Payer: PPO | Admitting: Sports Medicine

## 2016-03-14 DIAGNOSIS — L84 Corns and callosities: Secondary | ICD-10-CM

## 2016-03-14 DIAGNOSIS — E1142 Type 2 diabetes mellitus with diabetic polyneuropathy: Secondary | ICD-10-CM

## 2016-03-14 DIAGNOSIS — M2040 Other hammer toe(s) (acquired), unspecified foot: Secondary | ICD-10-CM

## 2016-03-14 NOTE — Patient Instructions (Signed)

## 2016-03-14 NOTE — Progress Notes (Signed)
Patient ID: Jean Mcclain, female   DOB: 04/16/1946, 70 y.o.   MRN: [place]:[id number] Patient presents for diabetic shoe pick up, shoes are tried on for good fit.  Patient received 1 Pair Orthofeet [place] in women's size 7.5 extra wide and 3 pairs custom molded diabetic inserts.  Verbal and written break in and wear instructions given.  Patient will follow up for scheduled routine care.   Patient discussed with medical assistant. Agree with above. Patient to follow up as scheduled for continued care or sooner if problems or issues arise. -Dr. Cannon Kettle

## 2016-04-05 ENCOUNTER — Ambulatory Visit (INDEPENDENT_AMBULATORY_CARE_PROVIDER_SITE_OTHER): Payer: PPO | Admitting: Sports Medicine

## 2016-04-05 ENCOUNTER — Encounter: Payer: Self-pay | Admitting: Sports Medicine

## 2016-04-05 DIAGNOSIS — I739 Peripheral vascular disease, unspecified: Secondary | ICD-10-CM

## 2016-04-05 DIAGNOSIS — M722 Plantar fascial fibromatosis: Secondary | ICD-10-CM | POA: Diagnosis not present

## 2016-04-05 DIAGNOSIS — B351 Tinea unguium: Secondary | ICD-10-CM | POA: Diagnosis not present

## 2016-04-05 DIAGNOSIS — M79676 Pain in unspecified toe(s): Secondary | ICD-10-CM

## 2016-04-05 DIAGNOSIS — E1142 Type 2 diabetes mellitus with diabetic polyneuropathy: Secondary | ICD-10-CM

## 2016-04-05 MED ORDER — DICLOFENAC SODIUM 75 MG PO TBEC: 75.0000 mg | DELAYED_RELEASE_TABLET | Freq: Two times a day (BID) | ORAL | Status: DC

## 2016-04-05 MED ORDER — TRIAMCINOLONE ACETONIDE 10 MG/ML IJ SUSP: 10.0000 mg | Freq: Once | INTRAMUSCULAR | Status: DC

## 2016-04-05 NOTE — Patient Instructions (Signed)

## 2016-04-05 NOTE — Progress Notes (Addendum)
Patient ID: Jean Mcclain, female   DOB: 1946/07/23, 70 y.o.   MRN: [id number]  Subjective: Jean Mcclain is a 70 y.o. female patient with history of diabetes who presents to office today complaining of long, painful nails  while ambulating in shoes; unable to trim. Patient also states that over the last few weeks her left heel has been hurting at the bottom; worse when getting up to walk and once the pain starts can be constant to the heel. Patient also states that her new diabetic shoes are not comfortable and she desires to return them and pick out a different style. Patient states that the glucose reading this morning was not recorded but has been up and down. Patient denies any other complaints at this time.    Patient Active Problem List   Diagnosis Date Noted  . Incarcerated ventral hernia 01/02/2013   Current Outpatient Prescriptions on File Prior to Visit  Medication Sig Dispense Refill  . Alcohol Swabs (ALCOHOL PADS) 70 % PADS     . Blood Glucose Monitoring Suppl (EASY TALK BLOOD GLUCOSE SYSTEM) DEVI     . calcium carbonate (OS-CAL) 600 MG TABS tablet Take 600 mg by mouth 2 (two) times daily with a meal.    . Canagliflozin (INVOKANA) 100 MG TABS Take 100 mg by mouth daily before breakfast.     . cloNIDine (CATAPRES) 0.1 MG tablet Take 0.1 mg by mouth daily.    . cyclobenzaprine (FLEXERIL) 10 MG tablet Take 10 mg by mouth at bedtime.    Marland Kitchen EASY COMFORT LANCETS MISC     . ergocalciferol (VITAMIN D2) 50000 UNITS capsule Take 50,000 Units by mouth once a week. Patient takes on Fridays    . ferrous sulfate 325 (65 FE) MG tablet Take 325 mg by mouth 2 (two) times daily.     . furosemide (LASIX) 40 MG tablet Take 40 mg by mouth.    . gabapentin (NEURONTIN) 100 MG capsule Take 200 mg by mouth 2 (two) times daily.    Marland Kitchen HUMULIN N PEN 100 UNIT/ML injection Inject 25 Units into the skin 2 (two) times daily.     . insulin aspart (NOVOLOG) 100 UNIT/ML injection Inject 14 Units into the skin 2  (two) times daily before a meal.     . lovastatin (MEVACOR) 40 MG tablet Take 40 mg by mouth at bedtime.     Marland Kitchen olmesartan-hydrochlorothiazide (BENICAR HCT) 40-12.5 MG per tablet Take 1 tablet by mouth daily before breakfast.     . omeprazole (PRILOSEC) 20 MG capsule Take 20 mg by mouth daily.    Marland Kitchen rOPINIRole (REQUIP) 4 MG tablet Take 4 mg by mouth 2 (two) times daily.      No current facility-administered medications on file prior to visit.   No Known Allergies  No results found for this or any previous visit (from the past 2160 hour(s)).  Objective: General: Patient is awake, alert, and oriented x 3 and in no acute distress.  Integument: Skin is warm, dry and supple bilateral. Nails are tender, long, thickened and  dystrophic with subungual debris, consistent with onychomycosis, 1-5 bilateral. No signs of infection. No open lesions or preulcerative lesions present bilateral. Remaining integument unremarkable.  Vasculature:  Dorsalis Pedis pulse 1/4 bilateral. Posterior Tibial pulse  0/4 bilateral. +1pitting edema bilateral Capillary fill time <3 sec 1-5 bilateral. No hair growth to the level of the digits. Temperature gradient within normal limits. +varicosities present bilateral.  Neurology: The patient has intact sensation  measured with a 5.07/10g Semmes Weinstein Monofilament at all pedal sites bilateral . Vibratory sensation diminished bilateral with tuning fork. No Babinski sign present bilateral.   Musculoskeletal:Pain with palpation to Left medial calcaneal tubercle and along plantar fascia on left, L>R pes planus deformity with mild hammertoes noted bilateral. Muscular strength 5/5 in all lower extremity muscular groups bilateral without pain on range of motion . No tenderness with calf compression bilateral.  Assessment and Plan: Problem List Items Addressed This Visit    None    Visit Diagnoses    Plantar fasciitis, left    -  Primary    Relevant Medications    diclofenac  (VOLTAREN) 75 MG EC tablet    triamcinolone acetonide (KENALOG) 10 MG/ML injection 10 mg    Dermatophytosis of nail        Diabetic polyneuropathy associated with type 2 diabetes mellitus (HCC)        PVD (peripheral vascular disease) ([place])          -Examined patient. -Discussed and educated patient on diabetic foot care, especially with  regards to the vascular, neurological and musculoskeletal systems.  -Stressed the importance of good glycemic control and the detriment of not  controlling glucose levels in relation to the foot. -Mechanically debrided all nails 1-5 bilateral using sterile nail nipper and filed with dremel without incident  -Cont with compression stockings daily for edema control -After oral consent and aseptic prep, injected a mixture containing 1 ml of 2%  plain lidocaine, 1 ml 0.5% plain marcaine, 0.5 ml of kenalog 10 and 0.5 ml of dexamethasone phosphate into left heel at plantar fasica without complication. Post-injection care discussed with patient.  -Rx Diclofenac to take as instructed -Recommend daily stretching exercises and good supportive shoes daily  -Diabetic shoes were returned and patient met with nurse in office to pick out a new style -Answered all patient questions -Patient to return in 2.5 months for at risk foot care -Patient advised to call the office if any problems or questions arise in the meantime.  Landis Martins, DPM

## 2016-04-19 ENCOUNTER — Telehealth: Payer: Self-pay | Admitting: Sports Medicine

## 2016-04-19 NOTE — Telephone Encounter (Signed)
Pt calling to see if her Diabetic shoes are in yet? I explained you would call when they are here to schedule an appt but pt asked if you could call her back now.

## 2016-04-20 NOTE — Telephone Encounter (Signed)
04/19/16 Pt called right back to leave voicemail  04/20/16  Called patient told her again she will get a call when her shoes arrive

## 2016-04-25 DIAGNOSIS — Z1211 Encounter for screening for malignant neoplasm of colon: Secondary | ICD-10-CM | POA: Diagnosis not present

## 2016-04-25 DIAGNOSIS — R748 Abnormal levels of other serum enzymes: Secondary | ICD-10-CM | POA: Diagnosis not present

## 2016-04-27 DIAGNOSIS — E113291 Type 2 diabetes mellitus with mild nonproliferative diabetic retinopathy without macular edema, right eye: Secondary | ICD-10-CM | POA: Diagnosis not present

## 2016-04-27 DIAGNOSIS — Z794 Long term (current) use of insulin: Secondary | ICD-10-CM | POA: Diagnosis not present

## 2016-04-27 DIAGNOSIS — E113292 Type 2 diabetes mellitus with mild nonproliferative diabetic retinopathy without macular edema, left eye: Secondary | ICD-10-CM | POA: Diagnosis not present

## 2016-04-27 DIAGNOSIS — H31003 Unspecified chorioretinal scars, bilateral: Secondary | ICD-10-CM | POA: Diagnosis not present

## 2016-04-28 DIAGNOSIS — R531 Weakness: Secondary | ICD-10-CM | POA: Diagnosis not present

## 2016-04-28 DIAGNOSIS — I10 Essential (primary) hypertension: Secondary | ICD-10-CM | POA: Diagnosis not present

## 2016-04-28 DIAGNOSIS — E119 Type 2 diabetes mellitus without complications: Secondary | ICD-10-CM | POA: Diagnosis not present

## 2016-04-28 DIAGNOSIS — E46 Unspecified protein-calorie malnutrition: Secondary | ICD-10-CM | POA: Diagnosis not present

## 2016-04-28 DIAGNOSIS — N179 Acute kidney failure, unspecified: Secondary | ICD-10-CM | POA: Diagnosis not present

## 2016-04-28 DIAGNOSIS — F1020 Alcohol dependence, uncomplicated: Secondary | ICD-10-CM | POA: Diagnosis not present

## 2016-05-03 ENCOUNTER — Ambulatory Visit: Payer: PPO | Admitting: *Deleted

## 2016-05-03 DIAGNOSIS — E1142 Type 2 diabetes mellitus with diabetic polyneuropathy: Secondary | ICD-10-CM

## 2016-05-03 NOTE — Patient Instructions (Signed)

## 2016-05-03 NOTE — Progress Notes (Signed)
Patient presents for pick up of reordered diabetic shoes Orthofeet [place] black spandex single strap in women's size 7.5 extra wide they are still too tight with her swelling we will and slightly short we will reorder same shoe only in an 8 XXW

## 2016-05-20 ENCOUNTER — Ambulatory Visit: Payer: PPO | Admitting: *Deleted

## 2016-05-20 DIAGNOSIS — E1142 Type 2 diabetes mellitus with diabetic polyneuropathy: Secondary | ICD-10-CM

## 2016-05-20 NOTE — Patient Instructions (Signed)

## 2016-05-24 NOTE — Progress Notes (Signed)
Patient ID: Jean Mcclain, female   DOB: 07/15/1946, 70 y.o.   MRN: [id number]  Patient presents for pick up of reordered diabetic shoes, shoes are tried on for good fit.  Patient received 1 pair [place] black spandex single strap I women's 8 extra extra wide patient already has her 3 pairs custom molded diabetic inserts.  Verbal and written break in and wear instructions given.  Patient will follow up for scheduled routine care.

## 2016-05-31 DIAGNOSIS — E7800 Pure hypercholesterolemia, unspecified: Secondary | ICD-10-CM | POA: Diagnosis not present

## 2016-05-31 DIAGNOSIS — E1165 Type 2 diabetes mellitus with hyperglycemia: Secondary | ICD-10-CM | POA: Diagnosis not present

## 2016-06-07 DIAGNOSIS — I10 Essential (primary) hypertension: Secondary | ICD-10-CM | POA: Diagnosis not present

## 2016-06-07 DIAGNOSIS — E7800 Pure hypercholesterolemia, unspecified: Secondary | ICD-10-CM | POA: Diagnosis not present

## 2016-06-07 DIAGNOSIS — E1165 Type 2 diabetes mellitus with hyperglycemia: Secondary | ICD-10-CM | POA: Diagnosis not present

## 2016-06-07 DIAGNOSIS — N189 Chronic kidney disease, unspecified: Secondary | ICD-10-CM | POA: Diagnosis not present

## 2016-06-07 DIAGNOSIS — R300 Dysuria: Secondary | ICD-10-CM | POA: Diagnosis not present

## 2016-06-14 ENCOUNTER — Ambulatory Visit (INDEPENDENT_AMBULATORY_CARE_PROVIDER_SITE_OTHER): Payer: PPO | Admitting: Sports Medicine

## 2016-06-14 ENCOUNTER — Ambulatory Visit: Payer: PPO | Admitting: Sports Medicine

## 2016-06-14 ENCOUNTER — Encounter: Payer: Self-pay | Admitting: Sports Medicine

## 2016-06-14 DIAGNOSIS — M722 Plantar fascial fibromatosis: Secondary | ICD-10-CM

## 2016-06-14 DIAGNOSIS — E1142 Type 2 diabetes mellitus with diabetic polyneuropathy: Secondary | ICD-10-CM

## 2016-06-14 DIAGNOSIS — M79671 Pain in right foot: Secondary | ICD-10-CM

## 2016-06-14 DIAGNOSIS — B351 Tinea unguium: Secondary | ICD-10-CM

## 2016-06-14 DIAGNOSIS — I739 Peripheral vascular disease, unspecified: Secondary | ICD-10-CM

## 2016-06-14 DIAGNOSIS — M79673 Pain in unspecified foot: Secondary | ICD-10-CM

## 2016-06-14 DIAGNOSIS — M7752 Other enthesopathy of left foot: Secondary | ICD-10-CM | POA: Diagnosis not present

## 2016-06-14 DIAGNOSIS — R600 Localized edema: Secondary | ICD-10-CM

## 2016-06-14 DIAGNOSIS — M25572 Pain in left ankle and joints of left foot: Secondary | ICD-10-CM | POA: Diagnosis not present

## 2016-06-14 DIAGNOSIS — M79672 Pain in left foot: Secondary | ICD-10-CM

## 2016-06-14 MED ORDER — TRIAMCINOLONE ACETONIDE 40 MG/ML IJ SUSP: 20.0000 mg | Freq: Once | INTRAMUSCULAR | Status: DC

## 2016-06-14 NOTE — Progress Notes (Signed)
Patient ID: SULI DANEK, female   DOB: 1946-09-21, 70 y.o.   MRN: [id number]  Subjective: Jean Mcclain is a 70 y.o. female patient with history of diabetes who presents to office today complaining of long, painful nails  while ambulating in shoes; unable to trim. Patient also states that over the last few weeks her left ankle has been hurting; worse with walking; patient is still awaiting her diabetic shoes. Patient states that the glucose reading this morning was not recorded but has been up and down. Patient denies any other complaints at this time.    Patient Active Problem List   Diagnosis Date Noted  . Incarcerated ventral hernia 01/02/2013   Current Outpatient Prescriptions on File Prior to Visit  Medication Sig Dispense Refill  . Alcohol Swabs (ALCOHOL PADS) 70 % PADS     . Blood Glucose Monitoring Suppl (EASY TALK BLOOD GLUCOSE SYSTEM) DEVI     . calcium carbonate (OS-CAL) 600 MG TABS tablet Take 600 mg by mouth 2 (two) times daily with a meal.    . Canagliflozin (INVOKANA) 100 MG TABS Take 100 mg by mouth daily before breakfast.     . cloNIDine (CATAPRES) 0.1 MG tablet Take 0.1 mg by mouth daily.    . cyclobenzaprine (FLEXERIL) 10 MG tablet Take 10 mg by mouth at bedtime.    . diclofenac (VOLTAREN) 75 MG EC tablet Take 1 tablet (75 mg total) by mouth 2 (two) times daily. 30 tablet 0  . EASY COMFORT LANCETS MISC     . ergocalciferol (VITAMIN D2) 50000 UNITS capsule Take 50,000 Units by mouth once a week. Patient takes on Fridays    . ferrous sulfate 325 (65 FE) MG tablet Take 325 mg by mouth 2 (two) times daily.     . furosemide (LASIX) 40 MG tablet Take 40 mg by mouth.    . gabapentin (NEURONTIN) 100 MG capsule Take 200 mg by mouth 2 (two) times daily.    Marland Kitchen HUMULIN N PEN 100 UNIT/ML injection Inject 25 Units into the skin 2 (two) times daily.     . insulin aspart (NOVOLOG) 100 UNIT/ML injection Inject 14 Units into the skin 2 (two) times daily before a meal.     . lovastatin  (MEVACOR) 40 MG tablet Take 40 mg by mouth at bedtime.     Marland Kitchen olmesartan-hydrochlorothiazide (BENICAR HCT) 40-12.5 MG per tablet Take 1 tablet by mouth daily before breakfast.     . omeprazole (PRILOSEC) 20 MG capsule Take 20 mg by mouth daily.    Marland Kitchen rOPINIRole (REQUIP) 4 MG tablet Take 4 mg by mouth 2 (two) times daily.      Current Facility-Administered Medications on File Prior to Visit  Medication Dose Route Frequency Provider Last Rate Last Dose  . triamcinolone acetonide (KENALOG) 10 MG/ML injection 10 mg  10 mg Other Once [employer], DPM       No Known Allergies  No results found for this or any previous visit (from the past 2160 hour(s)).  Objective: General: Patient is awake, alert, and oriented x 3 and in no acute distress.  Integument: Skin is warm, dry and supple bilateral. Nails are tender, long, thickened and dystrophic with subungual debris, consistent with onychomycosis, 1-5 bilateral. No signs of infection. No open lesions or preulcerative lesions present bilateral. Remaining integument unremarkable.  Vasculature:  Dorsalis Pedis pulse 1/4 bilateral. Posterior Tibial pulse  0/4 bilateral. +1pitting edema bilateral Capillary fill time <3 sec 1-5 bilateral. No hair growth to  the level of the digits. Temperature gradient within normal limits. +varicosities present bilateral.  Neurology: The patient has intact sensation measured with a 5.07/10g Semmes Weinstein Monofilament at all pedal sites bilateral . Vibratory sensation diminished bilateral with tuning fork. No Babinski sign present bilateral.   Musculoskeletal:Pain with palpation to Left lateral ankle along peroneal tendons and ankle joint, no pain to medial calcaneal tubercle and along plantar fascia on left, L>R pes planus deformity with mild hammertoes noted bilateral. Muscular strength 5/5 in all lower extremity muscular groups bilateral without pain on range of motion . No tenderness with calf compression  bilateral.  Assessment and Plan: Problem List Items Addressed This Visit    None    Visit Diagnoses    Dermatophytosis of nail    -  Primary   Plantar fasciitis, left       Improved   Diabetic polyneuropathy associated with type 2 diabetes mellitus (HCC)       PVD (peripheral vascular disease) (HCC)       Edema of both legs       Ankle pain, left       Relevant Medications   triamcinolone acetonide (KENALOG-40) injection 20 mg   Foot pain, bilateral       Capsulitis of ankle, left       Relevant Medications   triamcinolone acetonide (KENALOG-40) injection 20 mg      -Examined patient. -Discussed and educated patient on diabetic foot care, especially with  regards to the vascular, neurological and musculoskeletal systems.  -Stressed the importance of good glycemic control and the detriment of not  controlling glucose levels in relation to the foot. -Mechanically debrided all nails 1-5 bilateral using sterile nail nipper and filed with dremel without incident  -Cont with compression stockings daily for edema control; Rx request for zippered compression stockings -After oral consent and aseptic prep, injected a mixture containing 1 ml of 2%  plain lidocaine, 1 ml 0.5% plain marcaine, 0.5 ml of kenalog 40 and 0.5 ml of dexamethasone phosphate into left lateral ankle without complication. Post-injection care discussed with patient.  -Recommend daily stretching exercises and good supportive shoes daily  -Patient is awaiting new diabetic shoes -Answered all patient questions -Patient to return in 2.5 months for at risk foot care -Patient advised to call the office if any problems or questions arise in the meantime.  Landis Martins, DPM

## 2016-06-17 ENCOUNTER — Ambulatory Visit: Payer: PPO | Admitting: *Deleted

## 2016-06-17 DIAGNOSIS — E1142 Type 2 diabetes mellitus with diabetic polyneuropathy: Secondary | ICD-10-CM

## 2016-06-17 NOTE — Patient Instructions (Signed)

## 2016-06-17 NOTE — Progress Notes (Signed)
Patient ID: Jean Mcclain, female   DOB: [birth year], 70 y.o.   MRN: [id number]  Patient presents for pick up of reordered diabetic shoes, shoes are tried on for good fit patient is still complaining that shoes feel too tight.  Patient received 1 pair Orthofeet [place] black lace in women's size 8 extra extra wide.  Verbal and written break in and wear instructions given.  If this new style does not work she will refer back to Dr Cannon Kettle for possible referral out for custom shoes.  Patient will follow up for scheduled routine care.

## 2016-06-22 DIAGNOSIS — Z23 Encounter for immunization: Secondary | ICD-10-CM | POA: Diagnosis not present

## 2016-06-22 DIAGNOSIS — E1165 Type 2 diabetes mellitus with hyperglycemia: Secondary | ICD-10-CM | POA: Diagnosis not present

## 2016-07-01 ENCOUNTER — Telehealth: Payer: Self-pay | Admitting: *Deleted

## 2016-07-01 NOTE — Telephone Encounter (Addendum)
-----   Message from Landis Martins, [place] sent at 06/14/2016  4:39 PM EDT ----- Regarding: Zippered Compression stockings Order Zippered compression stockings  PVD Thanks Dr Cannon Kettle. 07/01/2016-faxed rx with orders for B/L zippered compression hose to [facility].

## 2016-08-23 ENCOUNTER — Encounter: Payer: Self-pay | Admitting: Sports Medicine

## 2016-08-23 ENCOUNTER — Ambulatory Visit (INDEPENDENT_AMBULATORY_CARE_PROVIDER_SITE_OTHER): Payer: PPO | Admitting: Sports Medicine

## 2016-08-23 DIAGNOSIS — E1142 Type 2 diabetes mellitus with diabetic polyneuropathy: Secondary | ICD-10-CM | POA: Diagnosis not present

## 2016-08-23 DIAGNOSIS — M79671 Pain in right foot: Secondary | ICD-10-CM

## 2016-08-23 DIAGNOSIS — I739 Peripheral vascular disease, unspecified: Secondary | ICD-10-CM

## 2016-08-23 DIAGNOSIS — B351 Tinea unguium: Secondary | ICD-10-CM

## 2016-08-23 DIAGNOSIS — M79672 Pain in left foot: Secondary | ICD-10-CM

## 2016-08-23 NOTE — Progress Notes (Signed)
Patient ID: Jean Mcclain, female   DOB: [birth year], 70 y.o.   MRN: [id number]  Subjective: Jean Mcclain is a 70 y.o. female patient with history of diabetes who presents to office today complaining of long, painful nails  while ambulating in shoes; unable to trim. Patient also states that she still has pain in swelling in ankle; swelling not getting better stays the same and has difficulty with stockings. Admits that she fell down a few days ago. Patient states that the glucose reading this morning was not recorded but has been up and down. Patient denies any other complaints at this time.    Patient Active Problem List   Diagnosis Date Noted  . Incarcerated ventral hernia 01/02/2013   Current Outpatient Prescriptions on File Prior to Visit  Medication Sig Dispense Refill  . Alcohol Swabs (ALCOHOL PADS) 70 % PADS     . Blood Glucose Monitoring Suppl (EASY TALK BLOOD GLUCOSE SYSTEM) DEVI     . calcium carbonate (OS-CAL) 600 MG TABS tablet Take 600 mg by mouth 2 (two) times daily with a meal.    . Canagliflozin (INVOKANA) 100 MG TABS Take 100 mg by mouth daily before breakfast.     . cloNIDine (CATAPRES) 0.1 MG tablet Take 0.1 mg by mouth daily.    . cyclobenzaprine (FLEXERIL) 10 MG tablet Take 10 mg by mouth at bedtime.    . diclofenac (VOLTAREN) 75 MG EC tablet Take 1 tablet (75 mg total) by mouth 2 (two) times daily. 30 tablet 0  . EASY COMFORT LANCETS MISC     . ergocalciferol (VITAMIN D2) 50000 UNITS capsule Take 50,000 Units by mouth once a week. Patient takes on Fridays    . ferrous sulfate 325 (65 FE) MG tablet Take 325 mg by mouth 2 (two) times daily.     . furosemide (LASIX) 40 MG tablet Take 40 mg by mouth.    . gabapentin (NEURONTIN) 100 MG capsule Take 200 mg by mouth 2 (two) times daily.    Marland Kitchen HUMULIN N PEN 100 UNIT/ML injection Inject 25 Units into the skin 2 (two) times daily.     . insulin aspart (NOVOLOG) 100 UNIT/ML injection Inject 14 Units into the skin 2 (two) times  daily before a meal.     . lovastatin (MEVACOR) 40 MG tablet Take 40 mg by mouth at bedtime.     Marland Kitchen olmesartan-hydrochlorothiazide (BENICAR HCT) 40-12.5 MG per tablet Take 1 tablet by mouth daily before breakfast.     . omeprazole (PRILOSEC) 20 MG capsule Take 20 mg by mouth daily.    Marland Kitchen rOPINIRole (REQUIP) 4 MG tablet Take 4 mg by mouth 2 (two) times daily.      Current Facility-Administered Medications on File Prior to Visit  Medication Dose Route Frequency Provider Last Rate Last Dose  . triamcinolone acetonide (KENALOG) 10 MG/ML injection 10 mg  10 mg Other Once [employer], DPM      . triamcinolone acetonide (KENALOG-40) injection 20 mg  20 mg Other Once [employer], DPM       No Known Allergies  No results found for this or any previous visit (from the past 2160 hour(s)).  Objective: General: Patient is awake, alert, and oriented x 3 and in no acute distress walker assisted gait.   Integument: Skin is warm, dry and supple bilateral. Nails are tender, long, thickened and dystrophic with subungual debris, consistent with onychomycosis, 1-5 bilateral. No signs of infection. No open lesions or preulcerative lesions present  bilateral. Remaining integument unremarkable.  Vasculature:  Dorsalis Pedis pulse 1/4 bilateral. Posterior Tibial pulse  0/4 bilateral. +1pitting edema bilateral Capillary fill time <3 sec 1-5 bilateral. No hair growth to the level of the digits. Temperature gradient within normal limits. +varicosities present bilateral.  Neurology: The patient has intact sensation measured with a 5.07/10g Semmes Weinstein Monofilament at all pedal sites bilateral . Vibratory sensation diminished bilateral with tuning fork. No Babinski sign present bilateral.   Musculoskeletal:Pain with palpation to Left lateral ankle along peroneal tendons and ankle joint, no pain to medial calcaneal tubercle and along plantar fascia on left, L>R with swelling likely adding to pain since patient  has difficulty with stockings, pes planus deformity with mild hammertoes noted bilateral. Muscular strength 5/5 in all lower extremity muscular groups bilateral without pain on range of motion . No tenderness with calf compression bilateral.  Assessment and Plan: Problem List Items Addressed This Visit    None    Visit Diagnoses    Diabetic polyneuropathy associated with type 2 diabetes mellitus ([place])    -  Primary   Dermatophytosis of nail       PVD (peripheral vascular disease) (HCC)       Foot pain, bilateral          -Examined patient. -Discussed and educated patient on diabetic foot care, especially with  regards to the vascular, neurological and musculoskeletal systems.  -Stressed the importance of good glycemic control and the detriment of not  controlling glucose levels in relation to the foot. -Mechanically debrided all nails 1-5 bilateral using sterile nail nipper and filed with dremel without incident  -Continue with walker and good supportive shoes -Cont with compression stockings daily for edema control; Rx velcro circade compression stockings -Answered all patient questions -Patient to return in 3 months for at risk foot care -Patient advised to call the office if any problems or questions arise in the meantime.  Landis Martins, DPM

## 2016-08-25 ENCOUNTER — Telehealth: Payer: Self-pay | Admitting: *Deleted

## 2016-08-25 NOTE — Telephone Encounter (Addendum)
Dr. Cannon Kettle ordered compression hose for pt's PVD, velcro preferably. I faxed orders to [facility] - Mr. Michail Jewels. I informed pt of the referral to [facility] and P, gave Mr. Durenda Age contact numbers. Faxed orders to [facility].

## 2016-09-19 DIAGNOSIS — E1165 Type 2 diabetes mellitus with hyperglycemia: Secondary | ICD-10-CM | POA: Diagnosis not present

## 2016-09-19 DIAGNOSIS — E7800 Pure hypercholesterolemia, unspecified: Secondary | ICD-10-CM | POA: Diagnosis not present

## 2016-09-19 DIAGNOSIS — N189 Chronic kidney disease, unspecified: Secondary | ICD-10-CM | POA: Diagnosis not present

## 2016-09-19 DIAGNOSIS — I10 Essential (primary) hypertension: Secondary | ICD-10-CM | POA: Diagnosis not present

## 2016-10-25 ENCOUNTER — Telehealth: Payer: Self-pay | Admitting: *Deleted

## 2016-10-25 ENCOUNTER — Ambulatory Visit (INDEPENDENT_AMBULATORY_CARE_PROVIDER_SITE_OTHER): Payer: PPO | Admitting: Sports Medicine

## 2016-10-25 ENCOUNTER — Encounter: Payer: Self-pay | Admitting: Sports Medicine

## 2016-10-25 ENCOUNTER — Telehealth: Payer: Self-pay

## 2016-10-25 DIAGNOSIS — M7989 Other specified soft tissue disorders: Secondary | ICD-10-CM

## 2016-10-25 DIAGNOSIS — G8929 Other chronic pain: Secondary | ICD-10-CM | POA: Diagnosis not present

## 2016-10-25 DIAGNOSIS — R52 Pain, unspecified: Secondary | ICD-10-CM

## 2016-10-25 DIAGNOSIS — I82402 Acute embolism and thrombosis of unspecified deep veins of left lower extremity: Secondary | ICD-10-CM

## 2016-10-25 DIAGNOSIS — E1142 Type 2 diabetes mellitus with diabetic polyneuropathy: Secondary | ICD-10-CM

## 2016-10-25 DIAGNOSIS — M25572 Pain in left ankle and joints of left foot: Secondary | ICD-10-CM

## 2016-10-25 DIAGNOSIS — M79671 Pain in right foot: Secondary | ICD-10-CM

## 2016-10-25 DIAGNOSIS — R609 Edema, unspecified: Secondary | ICD-10-CM

## 2016-10-25 DIAGNOSIS — M79672 Pain in left foot: Secondary | ICD-10-CM

## 2016-10-25 DIAGNOSIS — B351 Tinea unguium: Secondary | ICD-10-CM

## 2016-10-25 DIAGNOSIS — M79662 Pain in left lower leg: Secondary | ICD-10-CM

## 2016-10-25 NOTE — Telephone Encounter (Addendum)
Rip Harbour - [facility] states she will be able to get pt in tomorrow at 3:30PM for venous doppler r/o DVT. I gave orders to J.Quintana, RN and she will contact pt to schedule. 11/03/2016-Pt called for results of [place]. Left message to call for results. 11/04/2016-Unable to leave a message call would not go through. Left message for pt to call for results. Pt states she called back for results. I gave pt Dr. Leeanne Rio review of doppler and instructions to discontinue the full strength aspirin. Pt states she only took one aspirin that on that day she was given the instructions.

## 2016-10-25 NOTE — Progress Notes (Signed)
Patient ID: Jean Mcclain, female   DOB: [birth year], 71 y.o.   MRN: [id number]  Subjective: Jean Mcclain is a 71 y.o. female patient with history of diabetes who returns to office today complaining of long, painful nails  while ambulating in shoes; unable to trim. Patient also states that she still has pain in swelling in ankle; swelling not getting better and is worse because of she is not able to wear his compression wrap on left. Patient states that the glucose reading this morning was not recorded but has been up and down. Patient denies any other complaints at this time.    Patient Active Problem List   Diagnosis Date Noted  . Incarcerated ventral hernia 01/02/2013   Current Outpatient Prescriptions on File Prior to Visit  Medication Sig Dispense Refill  . Alcohol Swabs (ALCOHOL PADS) 70 % PADS     . Blood Glucose Monitoring Suppl (EASY TALK BLOOD GLUCOSE SYSTEM) DEVI     . calcium carbonate (OS-CAL) 600 MG TABS tablet Take 600 mg by mouth 2 (two) times daily with a meal.    . Canagliflozin (INVOKANA) 100 MG TABS Take 100 mg by mouth daily before breakfast.     . cloNIDine (CATAPRES) 0.1 MG tablet Take 0.1 mg by mouth daily.    . cyclobenzaprine (FLEXERIL) 10 MG tablet Take 10 mg by mouth at bedtime.    . diclofenac (VOLTAREN) 75 MG EC tablet Take 1 tablet (75 mg total) by mouth 2 (two) times daily. 30 tablet 0  . EASY COMFORT LANCETS MISC     . ergocalciferol (VITAMIN D2) 50000 UNITS capsule Take 50,000 Units by mouth once a week. Patient takes on Fridays    . ferrous sulfate 325 (65 FE) MG tablet Take 325 mg by mouth 2 (two) times daily.     . furosemide (LASIX) 40 MG tablet Take 40 mg by mouth.    . gabapentin (NEURONTIN) 100 MG capsule Take 200 mg by mouth 2 (two) times daily.    Marland Kitchen HUMULIN N PEN 100 UNIT/ML injection Inject 25 Units into the skin 2 (two) times daily.     . insulin aspart (NOVOLOG) 100 UNIT/ML injection Inject 14 Units into the skin 2 (two) times daily before a  meal.     . lovastatin (MEVACOR) 40 MG tablet Take 40 mg by mouth at bedtime.     Marland Kitchen olmesartan-hydrochlorothiazide (BENICAR HCT) 40-12.5 MG per tablet Take 1 tablet by mouth daily before breakfast.     . omeprazole (PRILOSEC) 20 MG capsule Take 20 mg by mouth daily.    Marland Kitchen rOPINIRole (REQUIP) 4 MG tablet Take 4 mg by mouth 2 (two) times daily.      Current Facility-Administered Medications on File Prior to Visit  Medication Dose Route Frequency Provider Last Rate Last Dose  . triamcinolone acetonide (KENALOG) 10 MG/ML injection 10 mg  10 mg Other Once [employer], DPM      . triamcinolone acetonide (KENALOG-40) injection 20 mg  20 mg Other Once [employer], DPM       No Known Allergies  No results found for this or any previous visit (from the past 2160 hour(s)).  Objective: General: Patient is awake, alert, and oriented x 3 and in no acute distress walker assisted gait.   Integument: Skin is warm, dry and supple bilateral. Nails are tender, long, thickened and dystrophic with subungual debris, consistent with onychomycosis, 1-5 bilateral. No signs of infection. No open lesions or preulcerative lesions present bilateral.  Remaining integument unremarkable.  Vasculature:  Dorsalis Pedis pulse 1/4 bilateral. Posterior Tibial pulse  0/4 bilateral. +1pitting edema bilateral with left extremity much more than right.  Capillary fill time <3 sec 1-5 bilateral. No hair growth to the level of the digits. Temperature gradient within normal limits. +varicosities present bilateral.  Neurology: The patient has intact sensation measured with a 5.07/10g Semmes Weinstein Monofilament at all pedal sites bilateral . Vibratory sensation diminished bilateral with tuning fork. No Babinski sign present bilateral.   Musculoskeletal:Pain with palpation to Left lateral ankle and along peroneal tendons and ankle joint, no pain to medial calcaneal tubercle and along plantar fascia on left, L>R with swelling likely  adding to pain since patient has difficulty with stockings, pes planus deformity with mild hammertoes noted bilateral. Muscular strength 5/5 in all lower extremity muscular groups bilateral without pain on range of motion. Mild tenderness with calf compression on left.   Assessment and Plan: Problem List Items Addressed This Visit    None    Visit Diagnoses    Dermatophytosis of nail    -  Primary   Diabetic polyneuropathy associated with type 2 diabetes mellitus (HCC)       Foot pain, bilateral       Chronic pain of left ankle       Pain and swelling of left lower leg       Deep vein thrombosis (DVT) of left lower extremity, unspecified chronicity, unspecified vein (HCC)       possible      -Examined patient. -Discussed and educated patient on diabetic foot care, especially with regards to the vascular, neurological and musculoskeletal systems.  -Stressed the importance of good glycemic control and the detriment of not  controlling glucose levels in relation to the foot. -Mechanically debrided all nails 1-5 bilateral using sterile nail nipper and filed with dremel without incident  -Continue with walker and good supportive shoes -Cont with compression stockings daily for edema control; velcro circade compression stockings -After oral consent and aseptic prep, injected a mixture containing 1 ml of 2%  plain lidocaine, 1 ml 0.5% plain marcaine, 0.5 ml of kenalog 10 and 0.5 ml of dexamethasone phosphate into left lateral ankle without complication. Post-injection care discussed with patient.  -Ordered stat venous duplex to r/o DVT on left since leg is painful and more swollen than last visit; The lab was not able to perform test today thus recommend full dose asprin for time being and to go to ER if symptoms worsen; Lab to call office with Duplex results once performed  -Answered all patient questions -Patient to return in as needed or in 3 months for at risk foot care -Patient advised to  call the office if any problems or questions arise in the meantime.  Landis Martins, DPM

## 2016-10-25 NOTE — Telephone Encounter (Signed)
LVM on house phone and mobile phone regarding appt at [facility] for venous duplex r/o DVT. Left instructions and all location information regarding appt at [facility] tomorrow at 3:30pm

## 2016-10-26 ENCOUNTER — Encounter (HOSPITAL_COMMUNITY): Payer: PPO

## 2016-10-27 ENCOUNTER — Ambulatory Visit (HOSPITAL_COMMUNITY)
Admission: RE | Admit: 2016-10-27 | Discharge: 2016-10-27 | Disposition: A | Discharge status: Home / Self Care | Payer: PPO | Source: Ambulatory Visit | Attending: Vascular Surgery | Admitting: Vascular Surgery

## 2016-10-27 DIAGNOSIS — R52 Pain, unspecified: Secondary | ICD-10-CM | POA: Diagnosis not present

## 2016-10-27 DIAGNOSIS — R609 Edema, unspecified: Secondary | ICD-10-CM | POA: Diagnosis not present

## 2016-11-03 NOTE — Telephone Encounter (Signed)
Please let patient know that her doppler was negative. NO DVT. She can discontinue full dose (325mg ) aspirin. Recommend her to use her compression wraps to help with the swelling. -Dr. Cannon Kettle

## 2016-11-04 NOTE — Telephone Encounter (Signed)
Ok thanks for [employer]. That's fine its good that it was negative because so no longer needs aspirin -Dr. Cannon Kettle

## 2016-11-07 DIAGNOSIS — N183 Chronic kidney disease, stage 3 (moderate): Secondary | ICD-10-CM | POA: Diagnosis not present

## 2016-11-07 DIAGNOSIS — E119 Type 2 diabetes mellitus without complications: Secondary | ICD-10-CM | POA: Diagnosis not present

## 2016-11-10 DIAGNOSIS — E119 Type 2 diabetes mellitus without complications: Secondary | ICD-10-CM | POA: Diagnosis not present

## 2016-11-10 DIAGNOSIS — I10 Essential (primary) hypertension: Secondary | ICD-10-CM | POA: Diagnosis not present

## 2016-11-10 DIAGNOSIS — N183 Chronic kidney disease, stage 3 (moderate): Secondary | ICD-10-CM | POA: Diagnosis not present

## 2016-11-15 DIAGNOSIS — I10 Essential (primary) hypertension: Secondary | ICD-10-CM | POA: Diagnosis not present

## 2016-11-15 DIAGNOSIS — N183 Chronic kidney disease, stage 3 (moderate): Secondary | ICD-10-CM | POA: Diagnosis not present

## 2016-11-28 DIAGNOSIS — I872 Venous insufficiency (chronic) (peripheral): Secondary | ICD-10-CM | POA: Diagnosis not present

## 2016-11-28 DIAGNOSIS — E1129 Type 2 diabetes mellitus with other diabetic kidney complication: Secondary | ICD-10-CM | POA: Diagnosis not present

## 2016-11-28 DIAGNOSIS — M25572 Pain in left ankle and joints of left foot: Secondary | ICD-10-CM | POA: Diagnosis not present

## 2016-11-28 DIAGNOSIS — Z6841 Body Mass Index (BMI) 40.0 and over, adult: Secondary | ICD-10-CM | POA: Diagnosis not present

## 2016-11-28 DIAGNOSIS — N183 Chronic kidney disease, stage 3 (moderate): Secondary | ICD-10-CM | POA: Diagnosis not present

## 2016-11-28 DIAGNOSIS — R600 Localized edema: Secondary | ICD-10-CM | POA: Diagnosis not present

## 2016-11-28 DIAGNOSIS — I10 Essential (primary) hypertension: Secondary | ICD-10-CM | POA: Diagnosis not present

## 2016-12-12 DIAGNOSIS — N183 Chronic kidney disease, stage 3 (moderate): Secondary | ICD-10-CM | POA: Diagnosis not present

## 2016-12-20 DIAGNOSIS — I10 Essential (primary) hypertension: Secondary | ICD-10-CM | POA: Diagnosis not present

## 2016-12-20 DIAGNOSIS — E1129 Type 2 diabetes mellitus with other diabetic kidney complication: Secondary | ICD-10-CM | POA: Diagnosis not present

## 2016-12-20 DIAGNOSIS — M25572 Pain in left ankle and joints of left foot: Secondary | ICD-10-CM | POA: Diagnosis not present

## 2016-12-20 DIAGNOSIS — G4733 Obstructive sleep apnea (adult) (pediatric): Secondary | ICD-10-CM | POA: Diagnosis not present

## 2016-12-20 DIAGNOSIS — M859 Disorder of bone density and structure, unspecified: Secondary | ICD-10-CM | POA: Diagnosis not present

## 2016-12-20 DIAGNOSIS — M109 Gout, unspecified: Secondary | ICD-10-CM | POA: Diagnosis not present

## 2016-12-20 DIAGNOSIS — Z1389 Encounter for screening for other disorder: Secondary | ICD-10-CM | POA: Diagnosis not present

## 2016-12-20 DIAGNOSIS — N183 Chronic kidney disease, stage 3 (moderate): Secondary | ICD-10-CM | POA: Diagnosis not present

## 2016-12-20 DIAGNOSIS — Z6841 Body Mass Index (BMI) 40.0 and over, adult: Secondary | ICD-10-CM | POA: Diagnosis not present

## 2016-12-20 DIAGNOSIS — E559 Vitamin D deficiency, unspecified: Secondary | ICD-10-CM | POA: Diagnosis not present

## 2016-12-27 ENCOUNTER — Ambulatory Visit (INDEPENDENT_AMBULATORY_CARE_PROVIDER_SITE_OTHER): Payer: PPO | Admitting: Sports Medicine

## 2016-12-27 DIAGNOSIS — M79671 Pain in right foot: Secondary | ICD-10-CM

## 2016-12-27 DIAGNOSIS — B351 Tinea unguium: Secondary | ICD-10-CM | POA: Diagnosis not present

## 2016-12-27 DIAGNOSIS — M79672 Pain in left foot: Secondary | ICD-10-CM

## 2016-12-27 DIAGNOSIS — E1142 Type 2 diabetes mellitus with diabetic polyneuropathy: Secondary | ICD-10-CM

## 2016-12-27 NOTE — Progress Notes (Signed)
Patient ID: Jean Mcclain, female   DOB: May 01, 1946, 71 y.o.   MRN: [id number]  Subjective: Jean Mcclain is a 71 y.o. female patient with history of diabetes who returns to office today complaining of long, painful nails  while ambulating in shoes; unable to trim. Patient also states that she has pain and was diagnosed with gout by her PCP and started on gout medication. Admits to 1 episode of pain in left leg last night that is now better after tylenol and adjusting her stockings on left. Patient states that the glucose reading this morning was not recorded but has been up and down. Patient denies any other complaints at this time.    Patient Active Problem List   Diagnosis Date Noted  . Incarcerated ventral hernia 01/02/2013   Current Outpatient Prescriptions on File Prior to Visit  Medication Sig Dispense Refill  . Alcohol Swabs (ALCOHOL PADS) 70 % PADS     . Blood Glucose Monitoring Suppl (EASY TALK BLOOD GLUCOSE SYSTEM) DEVI     . calcium carbonate (OS-CAL) 600 MG TABS tablet Take 600 mg by mouth 2 (two) times daily with a meal.    . Canagliflozin (INVOKANA) 100 MG TABS Take 100 mg by mouth daily before breakfast.     . cloNIDine (CATAPRES) 0.1 MG tablet Take 0.1 mg by mouth daily.    . cyclobenzaprine (FLEXERIL) 10 MG tablet Take 10 mg by mouth at bedtime.    . diclofenac (VOLTAREN) 75 MG EC tablet Take 1 tablet (75 mg total) by mouth 2 (two) times daily. 30 tablet 0  . EASY COMFORT LANCETS MISC     . ergocalciferol (VITAMIN D2) 50000 UNITS capsule Take 50,000 Units by mouth once a week. Patient takes on Fridays    . ferrous sulfate 325 (65 FE) MG tablet Take 325 mg by mouth 2 (two) times daily.     . furosemide (LASIX) 40 MG tablet Take 40 mg by mouth.    . gabapentin (NEURONTIN) 100 MG capsule Take 200 mg by mouth 2 (two) times daily.    Marland Kitchen HUMULIN N PEN 100 UNIT/ML injection Inject 25 Units into the skin 2 (two) times daily.     . insulin aspart (NOVOLOG) 100 UNIT/ML injection  Inject 14 Units into the skin 2 (two) times daily before a meal.     . lovastatin (MEVACOR) 40 MG tablet Take 40 mg by mouth at bedtime.     Marland Kitchen olmesartan-hydrochlorothiazide (BENICAR HCT) 40-12.5 MG per tablet Take 1 tablet by mouth daily before breakfast.     . omeprazole (PRILOSEC) 20 MG capsule Take 20 mg by mouth daily.    Marland Kitchen rOPINIRole (REQUIP) 4 MG tablet Take 4 mg by mouth 2 (two) times daily.      Current Facility-Administered Medications on File Prior to Visit  Medication Dose Route Frequency Provider Last Rate Last Dose  . triamcinolone acetonide (KENALOG) 10 MG/ML injection 10 mg  10 mg Other Once [employer], DPM      . triamcinolone acetonide (KENALOG-40) injection 20 mg  20 mg Other Once [employer], DPM       No Known Allergies  No results found for this or any previous visit (from the past 2160 hour(s)).  Objective: General: Patient is awake, alert, and oriented x 3 and in no acute distress walker assisted gait.   Integument: Skin is warm, dry and supple bilateral. Nails are tender, long, thickened and dystrophic with subungual debris, consistent with onychomycosis, 1-5 bilateral. No  signs of infection. No open lesions or preulcerative lesions present bilateral. Remaining integument unremarkable.  Vasculature:  Dorsalis Pedis pulse 1/4 bilateral. Posterior Tibial pulse  0/4 bilateral. +1pitting edema bilateral with left extremity > right.  Capillary fill time <3 sec 1-5 bilateral. No hair growth to the level of the digits. Temperature gradient within normal limits. +varicosities present bilateral.  Neurology: The patient has intact sensation measured with a 5.07/10g Semmes Weinstein Monofilament at all pedal sites bilateral . Vibratory sensation diminished bilateral with tuning fork. No Babinski sign present bilateral.   Musculoskeletal:No new pain to palpation bilateral, pes planus deformity with mild hammertoes noted bilateral. Muscular strength 5/5 in all lower  extremity muscular groups bilateral without pain on range of motion. No tenderness to calf bilateral.   Assessment and Plan: Problem List Items Addressed This Visit    None    Visit Diagnoses    Dermatophytosis of nail    -  Primary   Diabetic polyneuropathy associated with type 2 diabetes mellitus (HCC)       Foot pain, bilateral          -Examined patient. -Discussed and educated patient on diabetic foot care, especially with regards to the vascular, neurological and musculoskeletal systems.  -Stressed the importance of good glycemic control and the detriment of not  controlling glucose levels in relation to the foot. -Mechanically debrided all nails 1-5 bilateral using sterile nail nipper and filed with dremel without incident  -Continue with walker and good supportive shoes -Cont with compression stockings daily for edema control; velcro circade compression stockings -Cont with PCP recs on gout  -Answered all patient questions -Patient to return in as needed or in 3 months for at risk foot care -Patient advised to call the office if any problems or questions arise in the meantime.  Landis Martins, DPM

## 2017-01-27 DIAGNOSIS — D170 Benign lipomatous neoplasm of skin and subcutaneous tissue of head, face and neck: Secondary | ICD-10-CM | POA: Diagnosis not present

## 2017-01-27 DIAGNOSIS — E559 Vitamin D deficiency, unspecified: Secondary | ICD-10-CM | POA: Diagnosis not present

## 2017-01-27 DIAGNOSIS — M109 Gout, unspecified: Secondary | ICD-10-CM | POA: Diagnosis not present

## 2017-01-27 DIAGNOSIS — L723 Sebaceous cyst: Secondary | ICD-10-CM | POA: Diagnosis not present

## 2017-01-27 DIAGNOSIS — R0789 Other chest pain: Secondary | ICD-10-CM | POA: Diagnosis not present

## 2017-01-27 DIAGNOSIS — M79672 Pain in left foot: Secondary | ICD-10-CM | POA: Diagnosis not present

## 2017-01-31 ENCOUNTER — Ambulatory Visit (INDEPENDENT_AMBULATORY_CARE_PROVIDER_SITE_OTHER): Payer: PPO | Admitting: Orthopedic Surgery

## 2017-01-31 DIAGNOSIS — M25872 Other specified joint disorders, left ankle and foot: Secondary | ICD-10-CM

## 2017-01-31 DIAGNOSIS — M76822 Posterior tibial tendinitis, left leg: Secondary | ICD-10-CM

## 2017-01-31 MED ORDER — METHYLPREDNISOLONE ACETATE 40 MG/ML IJ SUSP
40.0000 mg | INTRAMUSCULAR | Status: AC | PRN
  Administered 2017-01-31: 40 mg via INTRA_ARTICULAR

## 2017-01-31 MED ORDER — LIDOCAINE HCL 1 % IJ SOLN
2.0000 mL | INTRAMUSCULAR | Status: AC | PRN
  Administered 2017-01-31: 2 mL

## 2017-01-31 NOTE — Progress Notes (Signed)
Office Visit Note   Patient: Jean Mcclain           Date of Birth: [birth year]           MRN: [id number] Visit Date: 01/31/2017              Requested by: Leanna Battles, MD [place], [place] [address] PCP: Donnajean Lopes, MD  No chief complaint on file.     HPI: Patient is a 71 year old woman with diabetic insensate neuropathy sleep apnea who does not use her CPAP machine who presents with pain in the left foot and ankle with chronic posterior tibial tendon insufficiency. Patient states she has to wear a postoperative shoe to get around she states that she cannot do any of her activities of daily living she has tried anti-inflammatories without relief she has been having increasing pain for the past couple of months she states the pain is brutal and uses a walker  due to instability. She states she did see podiatrist and was given an injection the lateral aspect of her foot. Patient states she sleeps in a recliner with her legs dependent. Assessment & Plan: Visit Diagnoses:  1. Impingement syndrome of left ankle   2. Posterior tibial tendinitis, left leg     Plan: The ankle was injected in the anterior medial portal. Recommended that she wear her fracture boot for the posterior tibial tendon insufficiency to see if this would help relieve her symptoms. Will follow-up next week. We'll need to get her x-rays from Dr. Buel Ream office she may need additional radiographs. Patient is requesting urgent surgical intervention to relieve her symptoms.  May need to obtain a uric acid level if patient has significant change in her symptoms after the steroid injection.  Follow-Up Instructions: Return in about 1 week (around 02/07/2017).   Ortho Exam  Patient is alert, oriented, no adenopathy, well-dressed, normal affect. Patient has shortness of breath while being seated. Patient has difficulty in ambulation with using a walker. Examination patient is exquisitely tender to  palpation globally around her ankle and foot. She seems to be most painful over the anterior lateral joint line of the ankle as well as over the posterior tibial tendon. She has a pronated valgus foot with pes planus with chronic posterior tibial tendon insufficiency. She has essentially no subtalar motion but attempted subtalar motion is not painful. She has pain with attempted plantarflexion and dorsiflexion of the ankle. Patient has complete insufficiency of the posterior tibial tendon.  Imaging: No results found.  Labs: Lab Results  Component Value Date   REPTSTATUS 10/03/2009 FINAL 10/01/2009   CULT  10/01/2009    Multiple bacterial morphotypes present, none predominant. Suggest appropriate recollection if clinically indicated.   LABORGA ESCHERICHIA COLI 01/23/2009    Orders:  No orders of the defined types were placed in this encounter.  No orders of the defined types were placed in this encounter.    Procedures: Medium Joint Inj Date/Time: 01/31/2017 5:21 PM Performed by: Basilia Stuckert V Authorized by: [name]   Consent Given by:  Patient Site marked: the procedure site was marked   Timeout: prior to procedure the correct patient, procedure, and site was verified   Indications:  Pain and diagnostic evaluation Location:  Ankle Site:  L ankle Prep: patient was prepped and draped in usual sterile fashion   Needle Size:  22 G Needle Length:  1.5 inches Approach:  Anteromedial Ultrasound Guided: No   Fluoroscopic Guidance: No  Medications:  2 mL lidocaine 1 %; 40 mg methylPREDNISolone acetate 40 MG/ML Aspiration Attempted: No   Patient tolerance:  Patient tolerated the procedure well with no immediate complications    Clinical Data: No additional findings.  ROS:  All other systems negative, except as noted in the HPI. Review of Systems  Objective: Vital Signs: There were no vitals taken for this visit.  Specialty Comments:  No specialty comments  available.  PMFS History: Patient Active Problem List   Diagnosis Date Noted  . Impingement syndrome of left ankle 01/31/2017  . Posterior tibial tendinitis, left leg 01/31/2017  . Incarcerated ventral hernia 01/02/2013   Past Medical History:  Diagnosis Date  . Arthritis   . Diabetes mellitus, type 2 ([place])   . Facet joint disease   . GERD (gastroesophageal reflux disease)   . Hyperlipidemia   . Hypertension   . OSA (obstructive sleep apnea)    does not use CPAP   . Osteopenia   . Shortness of breath    with exertion  . Vitamin D deficiency     Family History  Problem Relation Age of Onset  . Leukemia Mother   . Hypertension Brother   . Emphysema Father     Past Surgical History:  Procedure Laterality Date  . BREAST BIOPSY  1998   Rightx 2  . INSERTION OF MESH N/A 01/08/2013   Procedure: INSERTION OF MESH;  Surgeon: Earnstine Regal, MD;  Location: [facility];  Service: General;  Laterality: N/A;  . JOINT REPLACEMENT     bilateral knee replacements- 1999  . TOTAL KNEE ARTHROPLASTY  1999   Left  . VENTRAL HERNIA REPAIR N/A 01/08/2013   Procedure: LAPAROSCOPIC VENTRAL HERNIA;  Surgeon: Earnstine Regal, MD;  Location: [facility];  Service: General;  Laterality: N/A;   Social History   Occupational History  . Not on file.   Social History Main Topics  . Smoking status: Former Smoker    Years: 27.00    Types: Cigarettes    Quit date: 10/10/1981  . Smokeless tobacco: Never Used  . Alcohol use No  . Drug use: No  . Sexual activity: Not on file

## 2017-02-07 DIAGNOSIS — S82409A Unspecified fracture of shaft of unspecified fibula, initial encounter for closed fracture: Secondary | ICD-10-CM

## 2017-02-07 HISTORY — DX: Unspecified fracture of shaft of unspecified fibula, initial encounter for closed fracture: S82.409A

## 2017-02-09 ENCOUNTER — Ambulatory Visit (INDEPENDENT_AMBULATORY_CARE_PROVIDER_SITE_OTHER): Payer: PPO | Admitting: Orthopedic Surgery

## 2017-02-09 ENCOUNTER — Encounter (INDEPENDENT_AMBULATORY_CARE_PROVIDER_SITE_OTHER): Payer: Self-pay | Admitting: Orthopedic Surgery

## 2017-02-09 VITALS — Ht 59.0 in | Wt 226.0 lb

## 2017-02-09 DIAGNOSIS — M1A3720 Chronic gout due to renal impairment, left ankle and foot, without tophus (tophi): Secondary | ICD-10-CM | POA: Diagnosis not present

## 2017-02-09 DIAGNOSIS — M76822 Posterior tibial tendinitis, left leg: Secondary | ICD-10-CM | POA: Diagnosis not present

## 2017-02-09 DIAGNOSIS — M25872 Other specified joint disorders, left ankle and foot: Secondary | ICD-10-CM | POA: Diagnosis not present

## 2017-02-09 MED ORDER — NABUMETONE 750 MG PO TABS: 750.0000 mg | ORAL_TABLET | Freq: Two times a day (BID) | ORAL | 3 refills | Status: DC | PRN

## 2017-02-09 NOTE — Progress Notes (Signed)
Office Visit Note   Patient: Jean Mcclain           Date of Birth: [birth year]           MRN: [id number] Visit Date: 02/09/2017              Requested by: Leanna Battles, MD [address] [place], [place] [address] PCP: Donnajean Lopes, MD  Chief Complaint  Patient presents with  . Left Ankle - Follow-up    Impingement, status post injection 01/31/17      HPI: Patient has gout symptoms involving the foot and ankle and toes of her left foot. She states that at Dr. Buel Ream office blood work was drawn that showed an elevated uric acid she states that she is on colchicine and possibly allopurinol but she is not sure what her uric acid level was and I cannot obtain this from the computer. Patient complains of excruciating pain globally around the foot and ankle. Radiographs were reviewed which shows talonavicular and subtalar degenerative changes. Shows a maintained joint space of the ankle.  Assessment & Plan: Visit Diagnoses:  1. Impingement syndrome of left ankle   2. Posterior tibial tendinitis, left leg     Plan: We will start her on Relafen she will call us to make sure that she is on colchicine and allopurinol. I have recommended that she increase the colchicine to twice a day and the allopurinol 2 twice a day. We will draw a uric acid level today and follow-up in 1 week. Patient also has osteoarthritis of her right hipussed the importance of probable I nutrition in order for weight loss and an order for control of her gout.  Follow-Up Instructions: No Follow-up on file.   Ortho Exam  Patient is alert, oriented, no adenopathy, well-dressed, normal affect, normal respiratory effort. Examination patient is a good dorsalis pedis pulse she has venous stasis swelling with brawny skin color changes but no open ulcers. She is tender to palpation around the ankle midfoot and forefoot. She is exquisitely tender there is no cellulitis. All pain is involving the  joints.  Imaging: No results found.  Labs: Lab Results  Component Value Date   REPTSTATUS 10/03/2009 FINAL 10/01/2009   CULT  10/01/2009    Multiple bacterial morphotypes present, none predominant. Suggest appropriate recollection if clinically indicated.   LABORGA ESCHERICHIA COLI 01/23/2009    Orders:  No orders of the defined types were placed in this encounter.  No orders of the defined types were placed in this encounter.    Procedures: No procedures performed  Clinical Data: No additional findings.  ROS:  All other systems negative, except as noted in the HPI. Review of Systems  Objective: Vital Signs: Ht 4\' 11"  (1.499 m)   Wt 226 lb (102.5 kg)   BMI 45.65 kg/m   Specialty Comments:  No specialty comments available.  PMFS History: Patient Active Problem List   Diagnosis Date Noted  . Impingement syndrome of left ankle 01/31/2017  . Posterior tibial tendinitis, left leg 01/31/2017  . Incarcerated ventral hernia 01/02/2013   Past Medical History:  Diagnosis Date  . Arthritis   . Diabetes mellitus, type 2 ([facility])   . Facet joint disease ([place])   . GERD (gastroesophageal reflux disease)   . Hyperlipidemia   . Hypertension   . OSA (obstructive sleep apnea)    does not use CPAP   . Osteopenia   . Shortness of breath    with exertion  . Vitamin  D deficiency     Family History  Problem Relation Age of Onset  . Leukemia Mother   . Hypertension Brother   . Emphysema Father     Past Surgical History:  Procedure Laterality Date  . BREAST BIOPSY  1998   Rightx 2  . INSERTION OF MESH N/A 01/08/2013   Procedure: INSERTION OF MESH;  Surgeon: Earnstine Regal, MD;  Location: [facility];  Service: General;  Laterality: N/A;  . JOINT REPLACEMENT     bilateral knee replacements- 1999  . TOTAL KNEE ARTHROPLASTY  1999   Left  . VENTRAL HERNIA REPAIR N/A 01/08/2013   Procedure: LAPAROSCOPIC VENTRAL HERNIA;  Surgeon: Earnstine Regal, MD;  Location: [facility];  Service:  General;  Laterality: N/A;   Social History   Occupational History  . Not on file.   Social History Main Topics  . Smoking status: Former Smoker    Years: 27.00    Types: Cigarettes    Quit date: 10/10/1981  . Smokeless tobacco: Never Used  . Alcohol use No  . Drug use: No  . Sexual activity: Not on file

## 2017-02-09 NOTE — Addendum Note (Signed)
Addended by: Pamella Pert on: 02/09/2017 05:48 PM   Modules accepted: Orders

## 2017-02-10 ENCOUNTER — Telehealth (INDEPENDENT_AMBULATORY_CARE_PROVIDER_SITE_OTHER): Payer: Self-pay | Admitting: Radiology

## 2017-02-10 LAB — URIC ACID: Uric Acid, Serum: 5 mg/dL (ref 2.5–7.0)

## 2017-02-10 NOTE — Telephone Encounter (Signed)
Patient called, said that she was taking allopurinol, but is not taking it any longer.  She IS taking colchicine 0.6mg  and the Uloric samples she was given.  Please call her to advise further on exactly what she is to take.

## 2017-02-10 NOTE — Telephone Encounter (Signed)
Call patient have her give you the doses of the medicine she is taking have her continue with her current doses we will need to see her back in 3-4 weeks.

## 2017-02-10 NOTE — Telephone Encounter (Signed)
Here is the medication information. Uric acid was a 5

## 2017-02-13 NOTE — Telephone Encounter (Signed)
Patient called back advised her to continue with sample of uloric 40 mg 1 po q daily. When she is done she may resume allopurinol 100mg  1 po BID. And she can take colcrys prn basis until symptoms subside. Will follow up in 3-4 weeks.

## 2017-02-16 ENCOUNTER — Ambulatory Visit (INDEPENDENT_AMBULATORY_CARE_PROVIDER_SITE_OTHER): Payer: PPO | Admitting: Orthopedic Surgery

## 2017-02-23 DIAGNOSIS — I739 Peripheral vascular disease, unspecified: Secondary | ICD-10-CM | POA: Diagnosis not present

## 2017-02-23 DIAGNOSIS — M109 Gout, unspecified: Secondary | ICD-10-CM | POA: Diagnosis not present

## 2017-02-23 DIAGNOSIS — R600 Localized edema: Secondary | ICD-10-CM | POA: Diagnosis not present

## 2017-02-23 DIAGNOSIS — Z4800 Encounter for change or removal of nonsurgical wound dressing: Secondary | ICD-10-CM | POA: Diagnosis not present

## 2017-02-27 DIAGNOSIS — R600 Localized edema: Secondary | ICD-10-CM | POA: Diagnosis not present

## 2017-02-27 DIAGNOSIS — Z4800 Encounter for change or removal of nonsurgical wound dressing: Secondary | ICD-10-CM | POA: Diagnosis not present

## 2017-02-27 DIAGNOSIS — L03116 Cellulitis of left lower limb: Secondary | ICD-10-CM | POA: Diagnosis not present

## 2017-02-28 ENCOUNTER — Encounter: Payer: Self-pay | Admitting: Sports Medicine

## 2017-02-28 ENCOUNTER — Ambulatory Visit (INDEPENDENT_AMBULATORY_CARE_PROVIDER_SITE_OTHER): Payer: PPO | Admitting: Sports Medicine

## 2017-02-28 DIAGNOSIS — B351 Tinea unguium: Secondary | ICD-10-CM

## 2017-02-28 DIAGNOSIS — E1142 Type 2 diabetes mellitus with diabetic polyneuropathy: Secondary | ICD-10-CM

## 2017-02-28 DIAGNOSIS — M79671 Pain in right foot: Secondary | ICD-10-CM | POA: Diagnosis not present

## 2017-02-28 DIAGNOSIS — M79672 Pain in left foot: Secondary | ICD-10-CM

## 2017-02-28 DIAGNOSIS — I739 Peripheral vascular disease, unspecified: Secondary | ICD-10-CM

## 2017-02-28 NOTE — Progress Notes (Signed)
Patient ID: Jean Mcclain, female   DOB: Aug 27, 1946, 71 y.o.   MRN: [id number] Subjective: Jean Mcclain is a 71 y.o. female patient with history of diabetes who presents to office today complaining of long, painful nails  while ambulating in shoes; unable to trim. Patient states that the glucose reading this morning was ok. Going to PCP for leg wraps for ulceration on left with improvement and is currently on PO abx. Patient denies any new changes in medication or new problems. Patient denies any new cramping, numbness, burning or tingling in the legs.  Patient Active Problem List   Diagnosis Date Noted  . Impingement syndrome of left ankle 01/31/2017  . Posterior tibial tendinitis, left leg 01/31/2017  . Incarcerated ventral hernia 01/02/2013   Current Outpatient Prescriptions on File Prior to Visit  Medication Sig Dispense Refill  . Alcohol Swabs (ALCOHOL PADS) 70 % PADS     . Blood Glucose Monitoring Suppl (EASY TALK BLOOD GLUCOSE SYSTEM) DEVI     . calcium carbonate (OS-CAL) 600 MG TABS tablet Take 600 mg by mouth 2 (two) times daily with a meal.    . Canagliflozin (INVOKANA) 100 MG TABS Take 100 mg by mouth daily before breakfast.     . cloNIDine (CATAPRES) 0.1 MG tablet Take 0.1 mg by mouth daily.    . cyclobenzaprine (FLEXERIL) 10 MG tablet Take 10 mg by mouth at bedtime.    . diclofenac (VOLTAREN) 75 MG EC tablet Take 1 tablet (75 mg total) by mouth 2 (two) times daily. 30 tablet 0  . EASY COMFORT LANCETS MISC     . ergocalciferol (VITAMIN D2) 50000 UNITS capsule Take 50,000 Units by mouth once a week. Patient takes on Fridays    . ferrous sulfate 325 (65 FE) MG tablet Take 325 mg by mouth 2 (two) times daily.     . furosemide (LASIX) 40 MG tablet Take 40 mg by mouth.    . gabapentin (NEURONTIN) 100 MG capsule Take 200 mg by mouth 2 (two) times daily.    Marland Kitchen HUMULIN N PEN 100 UNIT/ML injection Inject 25 Units into the skin 2 (two) times daily.     . insulin aspart (NOVOLOG) 100  UNIT/ML injection Inject 14 Units into the skin 2 (two) times daily before a meal.     . lovastatin (MEVACOR) 40 MG tablet Take 40 mg by mouth at bedtime.     . nabumetone (RELAFEN) 750 MG tablet Take 1 tablet (750 mg total) by mouth 2 (two) times daily as needed for mild pain or moderate pain. with food 60 tablet 3  . olmesartan-hydrochlorothiazide (BENICAR HCT) 40-12.5 MG per tablet Take 1 tablet by mouth daily before breakfast.     . omeprazole (PRILOSEC) 20 MG capsule Take 20 mg by mouth daily.    Marland Kitchen rOPINIRole (REQUIP) 4 MG tablet Take 4 mg by mouth 2 (two) times daily.      Current Facility-Administered Medications on File Prior to Visit  Medication Dose Route Frequency Provider Last Rate Last Dose  . triamcinolone acetonide (KENALOG) 10 MG/ML injection 10 mg  10 mg Other Once Hanaan Gancarz, DPM      . triamcinolone acetonide (KENALOG-40) injection 20 mg  20 mg Other Once Landis Martins, DPM       No Known Allergies  Recent Results (from the past 2160 hour(s))  Uric acid     Status: None   Collection Time: 02/09/17  5:01 PM  Result Value Ref Range   Uric  Acid, Serum 5.0 2.5 - 7.0 mg/dL    Objective: General: Patient is awake, alert, and oriented x 3 and in no acute distress.  Integument: Skin is warm, dry and supple bilateral. Nails are tender, long, thickened and  dystrophic with subungual debris, consistent with onychomycosis, 1-5 bilateral. No signs of infection. No open lesions or preulcerative lesions present bilateral. Unna boot to left clean dry and intact. Remaining integument unremarkable.  Vasculature:  Dorsalis Pedis pulse 1/4 bilateral. Posterior Tibial pulse  0/4 bilateral. +1pitting edema bilateral with unna boot on left.  Capillary fill time <3 sec 1-5 bilateral. No hair growth to the level of the digits. Temperature gradient within normal limits.   Neurology: The patient has intact sensation measured with a 5.07/10g Semmes Weinstein Monofilament at all pedal  sites bilateral . Vibratory sensation diminished bilateral with tuning fork. No Babinski sign present bilateral.   Musculoskeletal: L>R pes planus deformity with mild hammertoes noted bilateral. Muscular strength 5/5 in all lower extremity muscular groups bilateral without pain on range of motion. No tenderness with calf compression bilateral.  Assessment and Plan: Problem List Items Addressed This Visit    None    Visit Diagnoses    Dermatophytosis of nail    -  Primary   Diabetic polyneuropathy associated with type 2 diabetes mellitus (HCC)       PVD (peripheral vascular disease) ([place])       Foot pain, bilateral         -Examined patient. -Discussed and educated patient on diabetic foot care, especially with  regards to the vascular, neurological and musculoskeletal systems.  -Stressed the importance of good glycemic control and the detriment of not  controlling glucose levels in relation to the foot. -Mechanically debrided all nails 1-5 bilateral using sterile nail nipper and filed with dremel without incident  -Continue with PCP and Follow up with Dr. Sharol Given for Left VLU  -Answered all patient questions -Patient to return in 3 months for at risk foot care -Patient advised to call the office if any problems or questions arise in the meantime.  Landis Martins, DPM

## 2017-03-01 ENCOUNTER — Telehealth: Payer: Self-pay | Admitting: *Deleted

## 2017-03-01 NOTE — Telephone Encounter (Signed)
Loosen the straps to make sure it's not too tight or return to using the other post op shoe that she already has -Dr. Chauncey Cruel

## 2017-03-01 NOTE — Telephone Encounter (Addendum)
Pt states the boot she was given yesterday, is different than the previous and causes her ankle to hurt when she walks. I spoke with pt and she described a surgical sandal, I told her I would ask Dr. Cannon Kettle what she wanted pt to do since the surgical sandal is too uncomfortable to wear.03/10/2017-Tammy - [facility] states pt is + for DVT in left popliteal vein. Dr. Jacqualyn Posey states go to ED now. Left message informing pt I would call for instructions pertaining to her Venous results. I spoke with Ms Girtha Rm, she states her husband is with pt at a test and she will call to tell him to expect a call from me on his cellphone [phone]. I spoke with pt on Mr. Victorio Palm cellphone and directed her to go to [facility] ED now and cautioned that if she did not she could get a blood clot in the lung. Pt states understanding. I informed [facility] that pt was + DVT left popliteal vein and coming to the ED by private vehicle.03/22/2017-Michelle - [facility] states pt was referred for treatment of left +DVT popliteal vein, but not results from doppler are available. I told Sharyn Lull the dopplers were perform by their group. Sharyn Lull - [facility] states the DVT was non-occlusive so they do not treat and she will inform pt, pt is to be treated by her PCP.

## 2017-03-02 ENCOUNTER — Ambulatory Visit (INDEPENDENT_AMBULATORY_CARE_PROVIDER_SITE_OTHER): Payer: PPO | Admitting: Podiatry

## 2017-03-02 ENCOUNTER — Ambulatory Visit (INDEPENDENT_AMBULATORY_CARE_PROVIDER_SITE_OTHER): Payer: PPO

## 2017-03-02 DIAGNOSIS — M79672 Pain in left foot: Principal | ICD-10-CM

## 2017-03-02 DIAGNOSIS — M79671 Pain in right foot: Secondary | ICD-10-CM

## 2017-03-02 DIAGNOSIS — M19072 Primary osteoarthritis, left ankle and foot: Secondary | ICD-10-CM

## 2017-03-02 DIAGNOSIS — M25872 Other specified joint disorders, left ankle and foot: Secondary | ICD-10-CM | POA: Diagnosis not present

## 2017-03-02 DIAGNOSIS — M76822 Posterior tibial tendinitis, left leg: Secondary | ICD-10-CM | POA: Diagnosis not present

## 2017-03-02 DIAGNOSIS — S8265XA Nondisplaced fracture of lateral malleolus of left fibula, initial encounter for closed fracture: Secondary | ICD-10-CM

## 2017-03-02 NOTE — Progress Notes (Signed)
   HPI:  71 year old female with a history of diabetes mellitus presents today for evaluation of severe foot pain to the left lower extremity. Patient states that she presented yesterday, 03/01/2017, with Dr. Cannon Kettle and received routine nail care. Patient states that she is in severe significant pain and unable walk on her left lower extremity. Patient presents today for follow-up treatment and evaluation    Physical Exam: General: The patient is alert and oriented x3 in no acute distress.  Dermatology: Superficial breakdown of skin noted to the left anterior leg. This does not appear to be a full-thickness ulcer. Periwound is stable. No sign of infectious process noted.  Vascular: Palpable pedal pulses bilaterally. Moderate edema noted to the left foot and ankle and bilateral lower extremities.  Neurological: Epicritic and protective threshold diminished bilaterally.   Musculoskeletal Exam: Severe pain on palpation noted to the fibular malleolus left ankle. Pain on palpation also noted throughout the midfoot. When the foot is in a loaded position their severe roof of valgus with medial longitudinal arch collapse. Limited range of motion noted to the subtalar joint and talonavicular joint.   Radiographic Exam:  Medial deviation of the talar head noted with disarticulation of the talonavicular joint. Degenerative changes noted. Diffuse demineralization with DJD noted throughout the foot and ankle left lower extremity.  Also appears to be a cortical irregularity and possible chip fracture to the medial aspect of the talus. AP ankle view also demonstrates possible nondisplaced spiral oblique fibular fracture with small comminuted type fractures directly overlying the lateral malleolus.  Assessment: 1. PTTD w/ DJD left lower extremity 2. Adult acquired flatfoot deformity left 3. Navicular fracture-closed, nondisplaced, avulsion type 4. Fibular ankle fracture-closed, nondisplaced 5. Diabetes  mellitus 6. Bilateral lower extremity edema 7. Morbid obesity  Plan of Care:  1. Patient was evaluated. X-rays were reviewed today with the patient 2. I discussed with the patient the degenerative changes within the left lower extremity. The patient likely requires a triple arthrodesis reconstructive surgery, however due to the patient's health status morbid obesity she may not be a surgical candidate. Patient understands and states that she needs a hip replacement however she is surgical candidate for that type of surgery either. At the moment we are going to manage her symptoms conservatively. 3. Cam boot dispensed 4. Multilayer Unna boots applied 5. Return to clinic in 4 weeks   Edrick Kins, DPM [facility]  Dr. Edrick Kins, [address]                                        [place], [place] [address]                Office [phone]  Fax [phone]

## 2017-03-02 NOTE — Telephone Encounter (Signed)
Called and informed pt if shoe was uncomfortable she could return to the other shoe she was using before or she could come in and we could try and pad the one she is currently using. Patient states at her last visit the nurse threw her old one in the trash. Advised she come in so we can pad the painful areas of the shoe she is using now. Pt states she would be here today once she finds transportation.

## 2017-03-07 ENCOUNTER — Ambulatory Visit (INDEPENDENT_AMBULATORY_CARE_PROVIDER_SITE_OTHER): Payer: PPO | Admitting: Orthopedic Surgery

## 2017-03-10 ENCOUNTER — Encounter (HOSPITAL_COMMUNITY): Payer: Self-pay

## 2017-03-10 ENCOUNTER — Ambulatory Visit (INDEPENDENT_AMBULATORY_CARE_PROVIDER_SITE_OTHER): Payer: PPO | Admitting: Podiatry

## 2017-03-10 ENCOUNTER — Ambulatory Visit (HOSPITAL_COMMUNITY)
Admission: RE | Admit: 2017-03-10 | Discharge: 2017-03-10 | Disposition: A | Discharge status: Home / Self Care | Payer: PPO | Source: Ambulatory Visit | Attending: Vascular Surgery | Admitting: Vascular Surgery

## 2017-03-10 ENCOUNTER — Inpatient Hospital Stay (HOSPITAL_COMMUNITY)
Admission: EM | Admit: 2017-03-10 | Discharge: 2017-03-12 | DRG: 300 | Disposition: A | Discharge status: Home / Self Care | Payer: PPO | Attending: Family Medicine | Admitting: Family Medicine

## 2017-03-10 DIAGNOSIS — R0600 Dyspnea, unspecified: Secondary | ICD-10-CM | POA: Diagnosis not present

## 2017-03-10 DIAGNOSIS — R609 Edema, unspecified: Secondary | ICD-10-CM

## 2017-03-10 DIAGNOSIS — I120 Hypertensive chronic kidney disease with stage 5 chronic kidney disease or end stage renal disease: Secondary | ICD-10-CM | POA: Diagnosis not present

## 2017-03-10 DIAGNOSIS — Z96653 Presence of artificial knee joint, bilateral: Secondary | ICD-10-CM | POA: Diagnosis not present

## 2017-03-10 DIAGNOSIS — Z794 Long term (current) use of insulin: Secondary | ICD-10-CM

## 2017-03-10 DIAGNOSIS — R600 Localized edema: Secondary | ICD-10-CM | POA: Diagnosis not present

## 2017-03-10 DIAGNOSIS — Z87891 Personal history of nicotine dependence: Secondary | ICD-10-CM

## 2017-03-10 DIAGNOSIS — I82432 Acute embolism and thrombosis of left popliteal vein: Principal | ICD-10-CM | POA: Diagnosis present

## 2017-03-10 DIAGNOSIS — G2581 Restless legs syndrome: Secondary | ICD-10-CM | POA: Diagnosis not present

## 2017-03-10 DIAGNOSIS — E785 Hyperlipidemia, unspecified: Secondary | ICD-10-CM | POA: Diagnosis present

## 2017-03-10 DIAGNOSIS — I10 Essential (primary) hypertension: Secondary | ICD-10-CM | POA: Diagnosis present

## 2017-03-10 DIAGNOSIS — I824Y2 Acute embolism and thrombosis of unspecified deep veins of left proximal lower extremity: Secondary | ICD-10-CM | POA: Diagnosis not present

## 2017-03-10 DIAGNOSIS — G4733 Obstructive sleep apnea (adult) (pediatric): Secondary | ICD-10-CM | POA: Diagnosis present

## 2017-03-10 DIAGNOSIS — N179 Acute kidney failure, unspecified: Secondary | ICD-10-CM | POA: Diagnosis present

## 2017-03-10 DIAGNOSIS — N289 Disorder of kidney and ureter, unspecified: Secondary | ICD-10-CM

## 2017-03-10 DIAGNOSIS — I129 Hypertensive chronic kidney disease with stage 1 through stage 4 chronic kidney disease, or unspecified chronic kidney disease: Secondary | ICD-10-CM | POA: Diagnosis present

## 2017-03-10 DIAGNOSIS — E1122 Type 2 diabetes mellitus with diabetic chronic kidney disease: Secondary | ICD-10-CM | POA: Diagnosis present

## 2017-03-10 DIAGNOSIS — N182 Chronic kidney disease, stage 2 (mild): Secondary | ICD-10-CM

## 2017-03-10 DIAGNOSIS — N185 Chronic kidney disease, stage 5: Secondary | ICD-10-CM

## 2017-03-10 DIAGNOSIS — N183 Chronic kidney disease, stage 3 (moderate): Secondary | ICD-10-CM | POA: Diagnosis not present

## 2017-03-10 DIAGNOSIS — I82812 Embolism and thrombosis of superficial veins of left lower extremities: Secondary | ICD-10-CM | POA: Diagnosis not present

## 2017-03-10 DIAGNOSIS — M8580 Other specified disorders of bone density and structure, unspecified site: Secondary | ICD-10-CM | POA: Diagnosis present

## 2017-03-10 DIAGNOSIS — R062 Wheezing: Secondary | ICD-10-CM

## 2017-03-10 DIAGNOSIS — K219 Gastro-esophageal reflux disease without esophagitis: Secondary | ICD-10-CM | POA: Diagnosis present

## 2017-03-10 DIAGNOSIS — Z6841 Body Mass Index (BMI) 40.0 and over, adult: Secondary | ICD-10-CM | POA: Diagnosis not present

## 2017-03-10 DIAGNOSIS — E119 Type 2 diabetes mellitus without complications: Secondary | ICD-10-CM

## 2017-03-10 DIAGNOSIS — I82409 Acute embolism and thrombosis of unspecified deep veins of unspecified lower extremity: Secondary | ICD-10-CM

## 2017-03-10 HISTORY — DX: Acute embolism and thrombosis of unspecified deep veins of unspecified lower extremity: I82.409

## 2017-03-10 LAB — CBC
HCT: 39.3 % (ref 36.0–46.0)
Hemoglobin: 12.3 g/dL (ref 12.0–15.0)
MCH: 29.5 pg (ref 26.0–34.0)
MCHC: 31.3 g/dL (ref 30.0–36.0)
MCV: 94.2 fL (ref 78.0–100.0)
Platelets: 248 10*3/uL (ref 150–400)
RBC: 4.17 MIL/uL (ref 3.87–5.11)
RDW: 16 % — ABNORMAL HIGH (ref 11.5–15.5)
WBC: 7.3 10*3/uL (ref 4.0–10.5)

## 2017-03-10 LAB — BASIC METABOLIC PANEL
Anion gap: 9 (ref 5–15)
BUN: 41 mg/dL — ABNORMAL HIGH (ref 6–20)
CO2: 29 mmol/L (ref 22–32)
Calcium: 8.8 mg/dL — ABNORMAL LOW (ref 8.9–10.3)
Chloride: 102 mmol/L (ref 101–111)
Creatinine, Ser: 2.11 mg/dL — ABNORMAL HIGH (ref 0.44–1.00)
GFR calc Af Amer: 26 mL/min — ABNORMAL LOW (ref >60)
GFR calc non Af Amer: 22 mL/min — ABNORMAL LOW (ref >60)
Glucose, Bld: 88 mg/dL (ref 65–99)
Potassium: 4.1 mmol/L (ref 3.5–5.1)
Sodium: 140 mmol/L (ref 135–145)

## 2017-03-10 LAB — CBG MONITORING, ED: Glucose-Capillary: 71 mg/dL (ref 65–99)

## 2017-03-10 NOTE — ED Triage Notes (Signed)
Pt states sent here by PCP. Pt states told has a blood clot in L popliteal vein. Pt with una boot to L foot. Pt states old L fracture. Pt denies any chest pain or SOB.

## 2017-03-10 NOTE — Progress Notes (Signed)
ANTICOAGULATION CONSULT NOTE - Initial Consult  Pharmacy Consult for Heparin Indication: L popliteal DVT (sent from PCP)  No Known Allergies  Patient Measurements: Height: 4' 11.5" (151.1 cm) Weight: 246 lb (111.6 kg) IBW/kg (Calculated) : 44.35 Heparin Dosing Weight: 72 kg  Vital Signs: Temp: 98.6 F (37 C) (06/01 1957) Temp Source: Oral (06/01 1957) BP: 126/65 (06/01 2317) Pulse Rate: 87 (06/01 2317)  Labs:  Recent Labs  03/10/17 2311  HGB 12.3  HCT 39.3  PLT 248  CREATININE 2.11*    Estimated Creatinine Clearance: 27.5 mL/min (A) (by C-G formula based on SCr of 2.11 mg/dL (H)).   Medical History: Past Medical History:  Diagnosis Date  . Arthritis   . Diabetes mellitus, type 2 ([place])   . Facet joint disease ([place])   . GERD (gastroesophageal reflux disease)   . Hyperlipidemia   . Hypertension   . OSA (obstructive sleep apnea)    does not use CPAP   . Osteopenia   . Shortness of breath    with exertion  . Vitamin D deficiency     Medications:  See electronic med rec  Assessment: 71 y.o. F presents from PCP with L popliteal DVT. To begin heparin. CBC ok on admission. Est CrCl 27 ml/min.  Goal of Therapy:  Heparin level 0.3-0.7 units/ml Monitor platelets by anticoagulation protocol: Yes   Plan:  Heparin IV bolus 4000 units Heparin gtt at 1150 units/hr Will f/u heparin level in 8 hours Daily heparin level and CBC  Sherlon Handing, PharmD, BCPS Clinical pharmacist, pager [phone]' 03/10/2017,11:52 PM

## 2017-03-10 NOTE — Progress Notes (Signed)
Patient presents for soft cast change. She has been treated by Dr. Amalia Hailey for likely fibular and navicular fractures. She has noticed that her leg has been more swollen over the last couple of days and she states "something is not right". No recent travel. Denies recent injury or trauma since last appointment. Denies any chest pain, SOB.   Objective: AAO 3, NAD DP/PT pulses palpable. Soft cast removed, noted edema to lower leg, severe pain on palpation of calf with warmth to area. She denied chest pain, SHOB, or any other acute symptoms.  Assessment: Increase left leg swelling, rule out DVT  Plan: Stat order for bilateral venous ultrasound, patient was sent to [facility]. She is to keep her follow up appt with Dr Amalia Hailey and follow any instructions given to her in regards to her ultrasound results.   Addendum: Received a call that the venous duplex was post ive for DVT. [facility] cannot treat, therefore sent to the ER.   Celesta Gentile, DPM

## 2017-03-11 ENCOUNTER — Inpatient Hospital Stay (HOSPITAL_COMMUNITY): Payer: PPO

## 2017-03-11 ENCOUNTER — Encounter (HOSPITAL_COMMUNITY): Payer: Self-pay | Admitting: Family Medicine

## 2017-03-11 DIAGNOSIS — I82432 Acute embolism and thrombosis of left popliteal vein: Secondary | ICD-10-CM | POA: Diagnosis present

## 2017-03-11 DIAGNOSIS — N289 Disorder of kidney and ureter, unspecified: Secondary | ICD-10-CM

## 2017-03-11 DIAGNOSIS — G4733 Obstructive sleep apnea (adult) (pediatric): Secondary | ICD-10-CM | POA: Diagnosis not present

## 2017-03-11 DIAGNOSIS — G2581 Restless legs syndrome: Secondary | ICD-10-CM | POA: Diagnosis not present

## 2017-03-11 DIAGNOSIS — E785 Hyperlipidemia, unspecified: Secondary | ICD-10-CM | POA: Diagnosis not present

## 2017-03-11 DIAGNOSIS — R0600 Dyspnea, unspecified: Secondary | ICD-10-CM

## 2017-03-11 DIAGNOSIS — Z794 Long term (current) use of insulin: Secondary | ICD-10-CM

## 2017-03-11 DIAGNOSIS — I82409 Acute embolism and thrombosis of unspecified deep veins of unspecified lower extremity: Secondary | ICD-10-CM | POA: Diagnosis not present

## 2017-03-11 DIAGNOSIS — Z87891 Personal history of nicotine dependence: Secondary | ICD-10-CM | POA: Diagnosis not present

## 2017-03-11 DIAGNOSIS — E1122 Type 2 diabetes mellitus with diabetic chronic kidney disease: Secondary | ICD-10-CM | POA: Diagnosis not present

## 2017-03-11 DIAGNOSIS — R600 Localized edema: Secondary | ICD-10-CM | POA: Diagnosis not present

## 2017-03-11 DIAGNOSIS — M8580 Other specified disorders of bone density and structure, unspecified site: Secondary | ICD-10-CM | POA: Diagnosis not present

## 2017-03-11 DIAGNOSIS — Z6841 Body Mass Index (BMI) 40.0 and over, adult: Secondary | ICD-10-CM | POA: Diagnosis not present

## 2017-03-11 DIAGNOSIS — K219 Gastro-esophageal reflux disease without esophagitis: Secondary | ICD-10-CM | POA: Diagnosis not present

## 2017-03-11 DIAGNOSIS — Z96653 Presence of artificial knee joint, bilateral: Secondary | ICD-10-CM | POA: Diagnosis not present

## 2017-03-11 DIAGNOSIS — E119 Type 2 diabetes mellitus without complications: Secondary | ICD-10-CM | POA: Diagnosis not present

## 2017-03-11 DIAGNOSIS — N179 Acute kidney failure, unspecified: Secondary | ICD-10-CM | POA: Diagnosis not present

## 2017-03-11 DIAGNOSIS — I10 Essential (primary) hypertension: Secondary | ICD-10-CM | POA: Diagnosis not present

## 2017-03-11 DIAGNOSIS — I129 Hypertensive chronic kidney disease with stage 1 through stage 4 chronic kidney disease, or unspecified chronic kidney disease: Secondary | ICD-10-CM | POA: Diagnosis not present

## 2017-03-11 DIAGNOSIS — N183 Chronic kidney disease, stage 3 (moderate): Secondary | ICD-10-CM | POA: Diagnosis not present

## 2017-03-11 HISTORY — DX: Acute embolism and thrombosis of left popliteal vein: I82.432

## 2017-03-11 LAB — GLUCOSE, CAPILLARY
Glucose-Capillary: 80 mg/dL (ref 65–99)
Glucose-Capillary: 165 mg/dL — ABNORMAL HIGH (ref 65–99)
Glucose-Capillary: 216 mg/dL — ABNORMAL HIGH (ref 65–99)
Glucose-Capillary: 184 mg/dL — ABNORMAL HIGH (ref 65–99)
Glucose-Capillary: 222 mg/dL — ABNORMAL HIGH (ref 65–99)

## 2017-03-11 LAB — ECHOCARDIOGRAM COMPLETE
AV Area VTI: 2.15 cm2
AV Peak grad: 13 mmHg
AV peak Index: 0.36
AV pk vel: 180 cm/s
Ao pk vel: 0.68 m/s
E decel time: 254 msec
FS: 27 % — AB (ref 28–44)
Height: 60 in
IVS/LV PW RATIO, ED: 1.42
LA ID, A-P, ES: 31 mm
LA diam end sys: 31 mm
LA diam index: 0.51 cm/m2
LA vol A4C: 57.9 ml
LA vol index: 10.3 mL/m2
LA vol: 62.2 mL
LV PW d: 12 mm — AB (ref 0.6–1.1)
LV e' LATERAL: 8.7 cm/s
LVOT MV VTI INDEX: 0.3 cm2/m2
LVOT MV VTI: 1.82
LVOT SV: 76 mL
LVOT VTI: 24.1 cm
LVOT area: 3.14 cm2
LVOT diameter: 20 mm
LVOT peak grad rest: 6 mmHg
LVOT peak vel: 123 cm/s
Lateral S' vel: 9.85 cm/s
MV Annulus VTI: 41.5 cm
MV Dec: 254
MV M vel: 114
MV pk E vel: 0.6 m/s
Mean grad: 6 mmHg
RV sys press: 18 mmHg
Reg peak vel: 191 cm/s
TAPSE: 24.3 mm
TDI e' lateral: 8.7
TDI e' medial: 7.29
TR max vel: 191 cm/s
Weight: 3955.2 oz

## 2017-03-11 LAB — BASIC METABOLIC PANEL
Anion gap: 10 (ref 5–15)
BUN: 38 mg/dL — ABNORMAL HIGH (ref 6–20)
CO2: 25 mmol/L (ref 22–32)
Calcium: 8.7 mg/dL — ABNORMAL LOW (ref 8.9–10.3)
Chloride: 104 mmol/L (ref 101–111)
Creatinine, Ser: 1.95 mg/dL — ABNORMAL HIGH (ref 0.44–1.00)
GFR calc Af Amer: 29 mL/min — ABNORMAL LOW (ref >60)
GFR calc non Af Amer: 25 mL/min — ABNORMAL LOW (ref >60)
Glucose, Bld: 187 mg/dL — ABNORMAL HIGH (ref 65–99)
Potassium: 4.1 mmol/L (ref 3.5–5.1)
Sodium: 139 mmol/L (ref 135–145)

## 2017-03-11 LAB — TROPONIN I
Troponin I: <0.03 ng/mL (ref <0.03)
Troponin I: <0.03 ng/mL (ref <0.03)
Troponin I: <0.03 ng/mL (ref <0.03)

## 2017-03-11 LAB — CBG MONITORING, ED: Glucose-Capillary: 75 mg/dL (ref 65–99)

## 2017-03-11 LAB — HEPARIN LEVEL (UNFRACTIONATED)
Heparin Unfractionated: <0.1 IU/mL — ABNORMAL LOW (ref 0.30–0.70)
Heparin Unfractionated: 0.19 IU/mL — ABNORMAL LOW (ref 0.30–0.70)

## 2017-03-11 MED ORDER — HEPARIN BOLUS VIA INFUSION
4000.0000 [IU] | Freq: Once | INTRAVENOUS | Status: AC
  Administered 2017-03-11: 4000 [IU] via INTRAVENOUS
  Filled 2017-03-11: qty 4000

## 2017-03-11 MED ORDER — SODIUM CHLORIDE 0.9% FLUSH
3.0000 mL | Freq: Two times a day (BID) | INTRAVENOUS | Status: DC
  Administered 2017-03-11: 3 mL via INTRAVENOUS

## 2017-03-11 MED ORDER — ONDANSETRON HCL 4 MG PO TABS: 4.0000 mg | ORAL_TABLET | Freq: Four times a day (QID) | ORAL | Status: DC | PRN

## 2017-03-11 MED ORDER — GABAPENTIN 300 MG PO CAPS
600.0000 mg | ORAL_CAPSULE | Freq: Two times a day (BID) | ORAL | Status: DC
  Administered 2017-03-11 – 2017-03-12 (×2): 600 mg via ORAL
  Filled 2017-03-11 (×2): qty 2

## 2017-03-11 MED ORDER — CYCLOBENZAPRINE HCL 10 MG PO TABS
10.0000 mg | ORAL_TABLET | Freq: Every day | ORAL | Status: DC
  Administered 2017-03-11 (×2): 10 mg via ORAL
  Filled 2017-03-11 (×2): qty 1

## 2017-03-11 MED ORDER — ACETAMINOPHEN 325 MG PO TABS: 650.0000 mg | ORAL_TABLET | Freq: Four times a day (QID) | ORAL | Status: DC | PRN

## 2017-03-11 MED ORDER — TRIAMCINOLONE 0.1 % CREAM:EUCERIN CREAM 1:1
TOPICAL_CREAM | Freq: Three times a day (TID) | CUTANEOUS | Status: DC | PRN
  Administered 2017-03-11: 19:00:00 via TOPICAL
  Filled 2017-03-11: qty 1

## 2017-03-11 MED ORDER — HEPARIN BOLUS VIA INFUSION
2000.0000 [IU] | Freq: Once | INTRAVENOUS | Status: AC
  Administered 2017-03-11: 2000 [IU] via INTRAVENOUS
  Filled 2017-03-11: qty 2000

## 2017-03-11 MED ORDER — HEPARIN (PORCINE) IN NACL 100-0.45 UNIT/ML-% IJ SOLN
1150.0000 [IU]/h | INTRAMUSCULAR | Status: DC
  Administered 2017-03-11: 1150 [IU]/h via INTRAVENOUS
  Filled 2017-03-11: qty 250

## 2017-03-11 MED ORDER — GABAPENTIN 300 MG PO CAPS
1200.0000 mg | ORAL_CAPSULE | Freq: Two times a day (BID) | ORAL | Status: DC
  Administered 2017-03-11 (×2): 1200 mg via ORAL
  Filled 2017-03-11 (×2): qty 4

## 2017-03-11 MED ORDER — HYDROCODONE-ACETAMINOPHEN 5-325 MG PO TABS: 1.0000 | ORAL_TABLET | ORAL | Status: DC | PRN

## 2017-03-11 MED ORDER — INSULIN DETEMIR 100 UNIT/ML ~~LOC~~ SOLN
60.0000 [IU] | Freq: Two times a day (BID) | SUBCUTANEOUS | Status: DC
  Administered 2017-03-11 – 2017-03-12 (×3): 60 [IU] via SUBCUTANEOUS
  Filled 2017-03-11 (×5): qty 0.6

## 2017-03-11 MED ORDER — ACETAMINOPHEN 650 MG RE SUPP: 650.0000 mg | Freq: Four times a day (QID) | RECTAL | Status: DC | PRN

## 2017-03-11 MED ORDER — PANTOPRAZOLE SODIUM 40 MG PO TBEC
40.0000 mg | DELAYED_RELEASE_TABLET | Freq: Every day | ORAL | Status: DC
  Administered 2017-03-11 – 2017-03-12 (×2): 40 mg via ORAL
  Filled 2017-03-11 (×2): qty 1

## 2017-03-11 MED ORDER — PRAVASTATIN SODIUM 40 MG PO TABS
40.0000 mg | ORAL_TABLET | Freq: Every day | ORAL | Status: DC
  Administered 2017-03-11: 40 mg via ORAL
  Filled 2017-03-11: qty 1

## 2017-03-11 MED ORDER — INSULIN ASPART 100 UNIT/ML ~~LOC~~ SOLN
20.0000 [IU] | Freq: Three times a day (TID) | SUBCUTANEOUS | Status: DC
  Administered 2017-03-11 (×2): 20 [IU] via SUBCUTANEOUS

## 2017-03-11 MED ORDER — SODIUM CHLORIDE 0.9 % IV SOLN
INTRAVENOUS | Status: AC
  Administered 2017-03-11: 01:00:00 via INTRAVENOUS

## 2017-03-11 MED ORDER — HEPARIN (PORCINE) IN NACL 100-0.45 UNIT/ML-% IJ SOLN
1500.0000 [IU]/h | INTRAMUSCULAR | Status: DC
  Administered 2017-03-11: 1200 [IU]/h via INTRAVENOUS
  Filled 2017-03-11 (×2): qty 250

## 2017-03-11 MED ORDER — ALBUTEROL SULFATE (2.5 MG/3ML) 0.083% IN NEBU
2.5000 mg | INHALATION_SOLUTION | Freq: Four times a day (QID) | RESPIRATORY_TRACT | Status: DC | PRN
  Filled 2017-03-11: qty 3

## 2017-03-11 MED ORDER — ALLOPURINOL 100 MG PO TABS
100.0000 mg | ORAL_TABLET | Freq: Every day | ORAL | Status: DC
  Administered 2017-03-11 – 2017-03-12 (×2): 100 mg via ORAL
  Filled 2017-03-11 (×2): qty 1

## 2017-03-11 MED ORDER — INSULIN ASPART 100 UNIT/ML ~~LOC~~ SOLN
0.0000 [IU] | Freq: Every day | SUBCUTANEOUS | Status: DC
  Administered 2017-03-11: 2 [IU] via SUBCUTANEOUS

## 2017-03-11 MED ORDER — BISACODYL 5 MG PO TBEC: 5.0000 mg | DELAYED_RELEASE_TABLET | Freq: Every day | ORAL | Status: DC | PRN

## 2017-03-11 MED ORDER — ONDANSETRON HCL 4 MG/2ML IJ SOLN: 4.0000 mg | Freq: Four times a day (QID) | INTRAMUSCULAR | Status: DC | PRN

## 2017-03-11 MED ORDER — ROPINIROLE HCL 1 MG PO TABS
4.0000 mg | ORAL_TABLET | Freq: Two times a day (BID) | ORAL | Status: DC
  Administered 2017-03-11 – 2017-03-12 (×4): 4 mg via ORAL
  Filled 2017-03-11 (×5): qty 4

## 2017-03-11 MED ORDER — INSULIN ASPART 100 UNIT/ML ~~LOC~~ SOLN
0.0000 [IU] | Freq: Three times a day (TID) | SUBCUTANEOUS | Status: DC
  Administered 2017-03-11 (×2): 4 [IU] via SUBCUTANEOUS
  Administered 2017-03-11: 7 [IU] via SUBCUTANEOUS
  Administered 2017-03-12: 4 [IU] via SUBCUTANEOUS
  Administered 2017-03-12: 3 [IU] via SUBCUTANEOUS

## 2017-03-11 MED ORDER — POLYETHYLENE GLYCOL 3350 17 G PO PACK: 17.0000 g | PACK | Freq: Every day | ORAL | Status: DC | PRN

## 2017-03-11 NOTE — H&P (Signed)
History and Physical    Jean Mcclain:[id number] DOB: 03-Jul-1946 DOA: 03/10/2017  PCP: Leanna Battles, MD   Patient coming from: Home   Chief Complaint: Lower left leg swelling, redness, pain   HPI: Jean Mcclain is a 71 y.o. female with medical history significant for hypertension, insulin-dependent diabetes mellitus, restless leg syndrome, chronic kidney disease, and chronic bilateral lower extremity edema, now presenting to the emergency department for evaluation of pain, swelling, and redness involving the lower left leg. Patient reports that she had been in her usual state of health until she noted the insidious development of pain and swelling involving the left ankle approximately 2 months ago. She was unable to fit her left foot into her shoes secondary to the swelling and has been wearing an orthopedic boot that she had at home. She saw her podiatrist for evaluation and underwent radiographs one week ago that demonstrated likely fibular and navicular fractures. She was advised by podiatry that she would likely require surgery for this, but may not be a surgical candidate due to her morbid obesity. She was advised to wear a cam boot which was applied in the clinic on 03/02/2017. Since that time, the pain and swelling has increased significantly and she is also developed significant erythema at the left lower leg. She returned to podiatry today for evaluation of these changes and was sent for venous ultrasound of the left lower extremity. She was notified that this study was positive for left popliteal DVT and she was directed to the ED for further evaluation of this. The patient notes dyspnea on exertion which is chronic, but has worsened over the past several days. She also notes the new development of wheezing. She denies chest pain or palpitations. She denies history of prior DVT or PE. She has never been on a anticoagulant. She does not know what her baseline creatinine is.  ED  Course: Upon arrival to the ED, patient is found to be afebrile, saturating well on room air, and with vital signs stable. Chemistry panels notable for serum creatinine at 2.11 with no recent prior for comparison. CBC is unremarkable. Patient was started on heparin infusion. She remained hemodynamically stable and has not been in acute respiratory distress. She will be admitted to the telemetry unit for ongoing evaluation and management of acute left popliteal DVT in the setting of left foot and ankle fractures, with worsening in her exertional dyspnea and wheezing concerning for possible PE.  Review of Systems:  All other systems reviewed and apart from HPI, are negative.  Past Medical History:  Diagnosis Date  . Arthritis   . Diabetes mellitus, type 2 ([place])   . Facet joint disease ([place])   . GERD (gastroesophageal reflux disease)   . Hyperlipidemia   . Hypertension   . OSA (obstructive sleep apnea)    does not use CPAP   . Osteopenia   . Shortness of breath    with exertion  . Vitamin D deficiency     Past Surgical History:  Procedure Laterality Date  . BREAST BIOPSY  1998   Rightx 2  . INSERTION OF MESH N/A 01/08/2013   Procedure: INSERTION OF MESH;  Surgeon: Earnstine Regal, MD;  Location: [facility];  Service: General;  Laterality: N/A;  . JOINT REPLACEMENT     bilateral knee replacements- 1999  . TOTAL KNEE ARTHROPLASTY  1999   Left  . VENTRAL HERNIA REPAIR N/A 01/08/2013   Procedure: LAPAROSCOPIC VENTRAL HERNIA;  Surgeon: Sherren Mocha  Leeanne Mannan, MD;  Location: [facility];  Service: General;  Laterality: N/A;     reports that she quit smoking about 35 years ago. Her smoking use included Cigarettes. She quit after 27.00 years of use. She has never used smokeless tobacco. She reports that she does not drink alcohol or use drugs.  No Known Allergies  Family History  Problem Relation Age of Onset  . Leukemia Mother   . Hypertension Brother   . Emphysema Father      Prior to Admission  medications   Medication Sig Start Date End Date Taking? Authorizing Provider  allopurinol (ZYLOPRIM) 100 MG tablet Take 100 mg by mouth daily. 02/24/17  Yes [provider]  colchicine 0.6 MG tablet Take 0.6 mg by mouth daily. 02/21/17  Yes [provider]  cyclobenzaprine (FLEXERIL) 10 MG tablet Take 10 mg by mouth at bedtime.   Yes [provider]  ergocalciferol (VITAMIN D2) 50000 UNITS capsule Take 50,000 Units by mouth once a week. Patient takes on Fridays   Yes [provider]  furosemide (LASIX) 40 MG tablet Take 40 mg by mouth.   Yes [provider]  gabapentin (NEURONTIN) 600 MG tablet Take 1,200 mg by mouth 2 (two) times daily. 01/03/17  Yes [provider]  insulin NPH Human (HUMULIN N,NOVOLIN N) 100 UNIT/ML injection Inject 15-75 Units into the skin See admin instructions. Use 65 units every morning then use 15 units at lunch then use 75 units at dinner   Yes [provider]  insulin regular (NOVOLIN R,HUMULIN R) 100 units/mL injection Inject 30 Units into the skin 3 (three) times daily before meals.   Yes [provider]  lisinopril (PRINIVIL,ZESTRIL) 5 MG tablet Take 5 mg by mouth daily. 03/07/17  Yes [provider]  lovastatin (MEVACOR) 40 MG tablet Take 40 mg by mouth at bedtime.    Yes [provider]  omeprazole (PRILOSEC) 20 MG capsule Take 20 mg by mouth daily.   Yes [provider]  rOPINIRole (REQUIP) 4 MG tablet Take 4 mg by mouth 2 (two) times daily.    Yes [provider]  Alcohol Swabs (ALCOHOL PADS) 70 % PADS  02/14/13   [provider]  Blood Glucose Monitoring Suppl (EASY TALK BLOOD GLUCOSE SYSTEM) DEVI  02/14/13   [provider]  EASY COMFORT LANCETS [place]  02/14/13   [provider]  febuxostat (ULORIC) 40 MG tablet Take 40 mg by mouth daily.    [provider]    Physical Exam: Vitals:   03/10/17 1645 03/10/17 1957 03/10/17 2317    BP: (!) 128/42 (!) 130/47 126/65  Pulse: 86 81 87  Resp: 18 16 20   Temp: 98.9 F (37.2 C) 98.6 F (37 C)   TempSrc: Oral Oral   SpO2: 92% 91% 100%  Weight: 111.6 kg (246 lb)    Height: 4' 11.5" (1.511 m)        Constitutional: NAD, calm, obese, appears uncomfortable.  Eyes: PERTLA, lids and conjunctivae normal ENMT: Mucous membranes are moist. Posterior pharynx clear of any exudate or lesions.   Neck: normal, supple, no masses, no thyromegaly Respiratory: Expiratory wheezes, with lungs otherwise clear to auscultation bilaterally. No accessory muscle use.  Cardiovascular: S1 & S2 heard, regular rate and rhythm. No significant JVD. Abdomen: No distension, no tenderness, no masses palpated. Bowel sounds normal.  Musculoskeletal: BLE edema, Lt >> Rt. Erythema and maceration to anterior left shin. Aside from left ankle, no red, hot, or  swollen joint.  Skin: no significant rashes, lesions, ulcers. Warm, dry, well-perfused. Neurologic: CN 2-12 grossly intact. Sensation intact, DTR normal. Strength 5/5 in all 4 limbs.  Psychiatric: Alert and oriented x 3. Calm and cooperative.     Labs on Admission: I have personally reviewed following labs and imaging studies  CBC:  Recent Labs Lab 03/10/17 2311  WBC 7.3  HGB 12.3  HCT 39.3  MCV 94.2  PLT 003   Basic Metabolic Panel:  Recent Labs Lab 03/10/17 2311  NA 140  K 4.1  CL 102  CO2 29  GLUCOSE 88  BUN 41*  CREATININE 2.11*  CALCIUM 8.8*   GFR: Estimated Creatinine Clearance: 27.5 mL/min (A) (by C-G formula based on SCr of 2.11 mg/dL (H)). Liver Function Tests: No results for input(s): AST, ALT, ALKPHOS, BILITOT, PROT, ALBUMIN in the last 168 hours. No results for input(s): LIPASE, AMYLASE in the last 168 hours. No results for input(s): AMMONIA in the last 168 hours. Coagulation Profile: No results for input(s): INR, PROTIME in the last 168 hours. Cardiac Enzymes: No results for input(s): CKTOTAL, CKMB, CKMBINDEX,  TROPONINI in the last 168 hours. BNP (last 3 results) No results for input(s): PROBNP in the last 8760 hours. HbA1C: No results for input(s): HGBA1C in the last 72 hours. CBG:  Recent Labs Lab 03/10/17 1925  GLUCAP 71   Lipid Profile: No results for input(s): CHOL, HDL, LDLCALC, TRIG, CHOLHDL, LDLDIRECT in the last 72 hours. Thyroid Function Tests: No results for input(s): TSH, T4TOTAL, FREET4, T3FREE, THYROIDAB in the last 72 hours. Anemia Panel: No results for input(s): VITAMINB12, FOLATE, FERRITIN, TIBC, IRON, RETICCTPCT in the last 72 hours. Urine analysis:    Component Value Date/Time   COLORURINE YELLOW 10/01/2009 1740   APPEARANCEUR CLEAR 10/01/2009 1740   LABSPEC 1.016 10/01/2009 1740   PHURINE 6.0 10/01/2009 1740   GLUCOSEU NEGATIVE 10/01/2009 1740   HGBUR NEGATIVE 10/01/2009 1740   BILIRUBINUR NEGATIVE 10/01/2009 1740   KETONESUR NEGATIVE 10/01/2009 1740   PROTEINUR NEGATIVE 10/01/2009 1740   UROBILINOGEN 0.2 10/01/2009 1740   NITRITE NEGATIVE 10/01/2009 1740   LEUKOCYTESUR  10/01/2009 1740    NEGATIVE MICROSCOPIC NOT DONE ON URINES WITH NEGATIVE PROTEIN, BLOOD, LEUKOCYTES, NITRITE, OR GLUCOSE <1000 mg/dL.   Sepsis Labs: @LABRCNTIP (procalcitonin:4,lacticidven:4) )No results found for this or any previous visit (from the past 240 hour(s)).   Radiological Exams on Admission: No results found.  EKG: Ordered and pending.   Assessment/Plan  1. Acute DVT - Pt was evaluated by podiatry for worsening left lower leg pain, swelling, and redness; she was sent for venous US and left popliteal DVT was identified  - No prior hx of VTE, likely precipitated by left fibular fracture for which she has been following with podiatry and wearing Ardelia Mems boot  - She was started on heparin infusion in ED  - PE possible given worsening DOE; check troponin, EKG, and echocardiogram  - SCr is <25 on presentation with unclear baseline; if renal fxn does not improve, she will likely  require treatment with warfarin    2. Renal insufficiency  - SCr is 2.11 on admission with no recent labs available for comparison  - Pt is aware that she has chronic kidney disease, but baseline SCr unknown  - Plan to hold lisinopril and Lasix, provide a very gentle IVF hydration, and repeat chem panel in am    3. Insulin-dependent DM  - No A1c on file  - Managed at home with regular insulin 30 units  TID, and NPH 65 units in am, 15 at noon, and 75 units qHS  - Check CBG with meals and qHS - Start Levemir 60 units BID with Novolog 20 units TID with meals, and a high-intensity correctional   4. Hypertension  - BP at goal in ED  - Managed at home with Norvasc and lisinopril  - Plan to continue Norvasc, hold lisinopril in light of renal insufficiency   5. Chronic leg edema  - Pt has chronic BLE edema for which she takes Lasix 40 mg qD  - No echo report on file  - Given the acute DVT and worsening DOE, will eval further with echocardiogram    6. Dyspnea on exertion  - Chronic, but worsened over past week, and now with wheezes  - Check CXR, echocardiogram, troponin, EKG  - Trial albuterol neb     DVT prophylaxis: heparin infusion  Code Status: Full Family Communication: Discussed with patient Disposition Plan: Admit to telemetry Consults called: None  Admission status: Inpatient  Severity of Illness: Treatment of patient's DVT is complicated by severe renal insufficiency (GFR <25), limiting oral anticoagulant options to essentially warfarin. There is also concern for PE given DOE.  She will likely need heparin infusion until INR is therapeutic.    Vianne Bulls, MD [facility] Pager [phone]  If 7PM-7AM, please contact night-coverage [email or web address] Password [facility]  03/11/2017, 12:29 AM

## 2017-03-11 NOTE — Progress Notes (Signed)
ANTICOAGULATION CONSULT NOTE - Follow Up Consult  Pharmacy Consult for heparin Indication: DVT  No Known Allergies  Patient Measurements: Height: 5' (152.4 cm) Weight: 247 lb 3.2 oz (112.1 kg) IBW/kg (Calculated) : 45.5 Heparin Dosing Weight: 73.5 kg  Vital Signs: Temp: 98.5 F (36.9 C) (06/02 1200) Temp Source: Oral (06/02 1200) BP: 134/72 (06/02 1200) Pulse Rate: 82 (06/02 1200)  Labs:  Recent Labs  03/10/17 2311 03/11/17 0026 03/11/17 0714 03/11/17 1248  HGB 12.3  --   --   --   HCT 39.3  --   --   --   PLT 248  --   --   --   HEPARINUNFRC  --   --   --  <0.10*  CREATININE 2.11*  --  1.95*  --   TROPONINI  --  <0.03 <0.03 <0.03    Estimated Creatinine Clearance: 30.1 mL/min (A) (by C-G formula based on SCr of 1.95 mg/dL (H)).  Assessment: 38 yoF with new L popliteal DVT on heparin gtt. Heparin was off this morning for two hours 2/2 losing IV access. IV access lost again for ~30 minutes. Heparin level came back undetectable. Since heparin has only been running intermittently and level is undetectable, then will rebolus and increase hourly rate slightly. CBC stable. No bleeding per nurse.   Goal of Therapy:  Heparin level 0.3-0.7 units/ml Monitor platelets by anticoagulation protocol: Yes   Plan:  Give Heparin 2000 units bolus Increase heparin gtt to 1200 units/hr F/u heparin level 8 hours Daily HL and CBC F/u oral anticoagulant plans  Dierdre Harness, [place], PharmD Clinical Pharmacy Resident [phone] (Pager) 03/11/2017 2:35 PM

## 2017-03-11 NOTE — Plan of Care (Signed)
Problem: Tissue Perfusion: Goal: Risk factors for ineffective tissue perfusion will decrease Outcome: Progressing Maintains oxygen saturation in the 90's on room air   Problem: Activity: Goal: Risk for activity intolerance will decrease Outcome: Progressing Patient up with walker to chair and walks to bathroom with standby assist

## 2017-03-11 NOTE — ED Notes (Signed)
Pt to xray

## 2017-03-11 NOTE — Progress Notes (Signed)
Patient transferred to 3 EAST from ED. Patient alert and oriented. Admission assessment complete. No complaints at this time. Patient in bed resting. Will continue to monitor.

## 2017-03-11 NOTE — Progress Notes (Signed)
ANTICOAGULATION CONSULT NOTE - Follow Up Consult  Pharmacy Consult for heparin Indication: DVT  Labs:  Recent Labs  03/10/17 2311 03/11/17 0026 03/11/17 0714 03/11/17 1248 03/11/17 2244  HGB 12.3  --   --   --   --   HCT 39.3  --   --   --   --   PLT 248  --   --   --   --   HEPARINUNFRC  --   --   --  <0.10* 0.19*  CREATININE 2.11*  --  1.95*  --   --   TROPONINI  --  <0.03 <0.03 <0.03  --     Assessment: 71yo female remains subtherapeutic on heparin after rate change though closer to goal.  Goal of Therapy:  Heparin level 0.3-0.7 units/ml   Plan:  Will rebolus with heparin 2000 units and increase gtt by 3 units/kg/hr to 1500 units/hr and check level in [place], PharmD, BCPS  03/11/2017,11:26 PM

## 2017-03-11 NOTE — Progress Notes (Signed)
Pt educated about safety and importance of bed alarm during the night however pt refuses to be on bed alarm. Will continue to round on patient.   Lott Seelbach, RN    

## 2017-03-11 NOTE — Progress Notes (Signed)
  Echocardiogram 2D Echocardiogram has been performed.  Jean Mcclain 03/11/2017, 2:55 PM

## 2017-03-11 NOTE — ED Provider Notes (Signed)
[facility] DEPT Provider Note   CSN: [id number] Arrival date & time: 03/10/17  1626     History   Chief Complaint Chief Complaint  Patient presents with  . Leg Pain    Blood Clot    HPI Jean Mcclain is a 71 y.o. female.  HPI  Pt with hx of DM and recent ankle fracture just 2 days ago comes in with DVT. Pt was being seen by a Podiatrist who ordered a DVT study for leg swelling. Her LLE recently was diagnosed with an ankle fx and she has a cam walker boot on. She had the DVT study done for a red, swollen leg, and it was + for DVT. PT was asked to come to the ER. Pt hasn o hx of DVT.  Past Medical History:  Diagnosis Date  . Arthritis   . Diabetes mellitus, type 2 ([place])   . Facet joint disease ([place])   . GERD (gastroesophageal reflux disease)   . Hyperlipidemia   . Hypertension   . OSA (obstructive sleep apnea)    does not use CPAP   . Osteopenia   . Shortness of breath    with exertion  . Vitamin D deficiency     Patient Active Problem List   Diagnosis Date Noted  . Acute deep vein thrombosis (DVT) of popliteal vein of left lower extremity ([place]) 03/11/2017  . Renal insufficiency 03/11/2017  . Insulin-requiring or dependent type II diabetes mellitus ([place]) 03/11/2017  . Essential hypertension 03/11/2017  . Impingement syndrome of left ankle 01/31/2017  . Posterior tibial tendinitis, left leg 01/31/2017  . Incarcerated ventral hernia 01/02/2013    Past Surgical History:  Procedure Laterality Date  . BREAST BIOPSY  1998   Rightx 2  . INSERTION OF MESH N/A 01/08/2013   Procedure: INSERTION OF MESH;  Surgeon: Earnstine Regal, MD;  Location: [facility];  Service: General;  Laterality: N/A;  . JOINT REPLACEMENT     bilateral knee replacements- 1999  . TOTAL KNEE ARTHROPLASTY  1999   Left  . VENTRAL HERNIA REPAIR N/A 01/08/2013   Procedure: LAPAROSCOPIC VENTRAL HERNIA;  Surgeon: Earnstine Regal, MD;  Location: [facility];  Service: General;  Laterality: N/A;    OB History     No data available       Home Medications    Prior to Admission medications   Medication Sig Start Date End Date Taking? Authorizing Provider  allopurinol (ZYLOPRIM) 100 MG tablet Take 100 mg by mouth daily. 02/24/17  Yes [provider]  colchicine 0.6 MG tablet Take 0.6 mg by mouth daily. 02/21/17  Yes [provider]  cyclobenzaprine (FLEXERIL) 10 MG tablet Take 10 mg by mouth at bedtime.   Yes [provider]  ergocalciferol (VITAMIN D2) 50000 UNITS capsule Take 50,000 Units by mouth once a week. Patient takes on Fridays   Yes [provider]  furosemide (LASIX) 40 MG tablet Take 40 mg by mouth.   Yes [provider]  gabapentin (NEURONTIN) 600 MG tablet Take 1,200 mg by mouth 2 (two) times daily. 01/03/17  Yes [provider]  insulin NPH Human (HUMULIN N,NOVOLIN N) 100 UNIT/ML injection Inject 15-75 Units into the skin See admin instructions. Use 65 units every morning then use 15 units at lunch then use 75 units at dinner   Yes [provider]  insulin regular (NOVOLIN R,HUMULIN R) 100 units/mL injection Inject 30 Units into the skin 3 (three) times daily before meals.  Yes [provider]  lisinopril (PRINIVIL,ZESTRIL) 5 MG tablet Take 5 mg by mouth daily. 03/07/17  Yes [provider]  lovastatin (MEVACOR) 40 MG tablet Take 40 mg by mouth at bedtime.    Yes [provider]  omeprazole (PRILOSEC) 20 MG capsule Take 20 mg by mouth daily.   Yes [provider]  rOPINIRole (REQUIP) 4 MG tablet Take 4 mg by mouth 2 (two) times daily.    Yes [provider]  Alcohol Swabs (ALCOHOL PADS) 70 % PADS  02/14/13   [provider]  Blood Glucose Monitoring Suppl (EASY TALK BLOOD GLUCOSE SYSTEM) DEVI  02/14/13   [provider]  EASY COMFORT LANCETS [place]  02/14/13   [provider]  febuxostat (ULORIC) 40 MG tablet Take 40 mg by mouth daily.    [provider]     Family History Family History  Problem Relation Age of Onset  . Leukemia Mother   . Hypertension Brother   . Emphysema Father     Social History Social History  Substance Use Topics  . Smoking status: Former Smoker    Years: 27.00    Types: Cigarettes    Quit date: 10/10/1981  . Smokeless tobacco: Never Used  . Alcohol use No     Allergies   Patient has no known allergies.   Review of Systems Review of Systems  Constitutional: Negative for activity change.  Respiratory: Negative for shortness of breath.   Cardiovascular: Negative for chest pain.  Gastrointestinal: Negative for abdominal pain and nausea.  Skin: Positive for rash.     Physical Exam Updated Vital Signs BP 131/74   Pulse 90   Temp 98.6 F (37 C) (Oral)   Resp 20   Ht 4' 11.5" (1.511 m)   Wt 111.6 kg (246 lb)   SpO2 91%   BMI 48.85 kg/m   Physical Exam  Constitutional: She is oriented to person, place, and time. She appears well-developed and well-nourished.  HENT:  Head: Normocephalic and atraumatic.  Eyes: EOM are normal. Pupils are equal, round, and reactive to light.  Neck: Neck supple.  Cardiovascular: Normal rate, regular rhythm and normal heart sounds.   No murmur heard. Pulmonary/Chest: Effort normal. No respiratory distress.  Abdominal: Soft. She exhibits no distension. There is no tenderness. There is no rebound and no guarding.  Musculoskeletal:  LLE in cam walker. Skin exam not completed.  Neurological: She is alert and oriented to person, place, and time.  Skin: Skin is warm and dry.  Nursing note and vitals reviewed.    ED Treatments / Results  Labs (all labs ordered are listed, but only abnormal results are displayed) Labs Reviewed  BASIC METABOLIC PANEL - Abnormal; Notable for the following:       Result Value   BUN 41 (*)    Creatinine, Ser 2.11 (*)    Calcium 8.8 (*)    GFR calc non Af Amer 22 (*)    GFR calc Af Amer 26 (*)    All other components within  normal limits  CBC - Abnormal; Notable for the following:    RDW 16.0 (*)    All other components within normal limits  HEPARIN LEVEL (UNFRACTIONATED)  BASIC METABOLIC PANEL  TROPONIN I  TROPONIN I  TROPONIN I  HEMOGLOBIN A1C  CBG MONITORING, ED    EKG  EKG Interpretation None       Radiology No results found.  Procedures Procedures (including critical care time)  Medications  Ordered in ED Medications  heparin bolus via infusion 4,000 Units (not administered)  heparin ADULT infusion 100 units/mL (25000 units/254mL sodium chloride 0.45%) (not administered)  allopurinol (ZYLOPRIM) tablet 100 mg (not administered)  gabapentin (NEURONTIN) capsule 1,200 mg (not administered)  cyclobenzaprine (FLEXERIL) tablet 10 mg (not administered)  pantoprazole (PROTONIX) EC tablet 40 mg (not administered)  pravastatin (PRAVACHOL) tablet 40 mg (not administered)  rOPINIRole (REQUIP) tablet 4 mg (not administered)  sodium chloride flush (NS) 0.9 % injection 3 mL (not administered)  0.9 %  sodium chloride infusion (not administered)  acetaminophen (TYLENOL) tablet 650 mg (not administered)    Or  acetaminophen (TYLENOL) suppository 650 mg (not administered)  HYDROcodone-acetaminophen (NORCO/VICODIN) 5-325 MG per tablet 1-2 tablet (not administered)  polyethylene glycol (MIRALAX / GLYCOLAX) packet 17 g (not administered)  bisacodyl (DULCOLAX) EC tablet 5 mg (not administered)  ondansetron (ZOFRAN) tablet 4 mg (not administered)    Or  ondansetron (ZOFRAN) injection 4 mg (not administered)  insulin detemir (LEVEMIR) injection 60 Units (not administered)  insulin aspart (novoLOG) injection 0-20 Units (not administered)  insulin aspart (novoLOG) injection 0-5 Units (not administered)  insulin aspart (novoLOG) injection 20 Units (not administered)  albuterol (PROVENTIL) (2.5 MG/3ML) 0.083% nebulizer solution 2.5 mg (not administered)     Initial Impression / Assessment and Plan / ED  Course  I have reviewed the triage vital signs and the nursing notes.  Pertinent labs & imaging results that were available during my care of the patient were reviewed by me and considered in my medical decision making (see chart for details).     Pt comes in with cc of leg swelling. She was diagnosed with DVT, and asked to come to the ER. She informs me that the clot was in the politeal artery, behind her L knee. LLE in cam walker boot, pt able to move her toes, has no numbness, tingling and has no significant leg pain - so the exam wasn't thought to be consequential.  [name] GFR < 25, unfortunately, she is not a candidate for DOACs - so we will start heparin and admit for coumadin.   Final Clinical Impressions(s) / ED Diagnoses   Final diagnoses:  Acute deep vein thrombosis (DVT) of popliteal vein of left lower extremity (HCC)  Stage 5 chronic kidney disease not on chronic dialysis [facility])    New Prescriptions New Prescriptions   No medications on file     Varney Biles, MD 03/11/17 0100

## 2017-03-11 NOTE — Progress Notes (Signed)
Patient seen and examined at bedside, patient admitted after midnight, please see earlier detailed admission note by Vianne Bulls, MD. Briefly, patient presented with an acute DVT. She is on heparin. Kidney function is improving. Gabapentin renally adjusted. Topical steroid for itching.   Cordelia Poche, MD [facility] 03/11/2017, 2:39 PM Pager: [phone]

## 2017-03-12 DIAGNOSIS — I82432 Acute embolism and thrombosis of left popliteal vein: Secondary | ICD-10-CM | POA: Diagnosis not present

## 2017-03-12 DIAGNOSIS — N289 Disorder of kidney and ureter, unspecified: Secondary | ICD-10-CM | POA: Diagnosis not present

## 2017-03-12 DIAGNOSIS — Z794 Long term (current) use of insulin: Secondary | ICD-10-CM | POA: Diagnosis not present

## 2017-03-12 DIAGNOSIS — I10 Essential (primary) hypertension: Secondary | ICD-10-CM | POA: Diagnosis not present

## 2017-03-12 DIAGNOSIS — E119 Type 2 diabetes mellitus without complications: Secondary | ICD-10-CM | POA: Diagnosis not present

## 2017-03-12 LAB — BASIC METABOLIC PANEL
Anion gap: 8 (ref 5–15)
BUN: 32 mg/dL — ABNORMAL HIGH (ref 6–20)
CO2: 27 mmol/L (ref 22–32)
Calcium: 8.8 mg/dL — ABNORMAL LOW (ref 8.9–10.3)
Chloride: 103 mmol/L (ref 101–111)
Creatinine, Ser: 1.76 mg/dL — ABNORMAL HIGH (ref 0.44–1.00)
GFR calc Af Amer: 32 mL/min — ABNORMAL LOW (ref >60)
GFR calc non Af Amer: 28 mL/min — ABNORMAL LOW (ref >60)
Glucose, Bld: 156 mg/dL — ABNORMAL HIGH (ref 65–99)
Potassium: 4.2 mmol/L (ref 3.5–5.1)
Sodium: 138 mmol/L (ref 135–145)

## 2017-03-12 LAB — CBC
HCT: 38.4 % (ref 36.0–46.0)
Hemoglobin: 12 g/dL (ref 12.0–15.0)
MCH: 29.3 pg (ref 26.0–34.0)
MCHC: 31.3 g/dL (ref 30.0–36.0)
MCV: 93.9 fL (ref 78.0–100.0)
Platelets: 216 10*3/uL (ref 150–400)
RBC: 4.09 MIL/uL (ref 3.87–5.11)
RDW: 15.7 % — ABNORMAL HIGH (ref 11.5–15.5)
WBC: 6.3 10*3/uL (ref 4.0–10.5)

## 2017-03-12 LAB — GLUCOSE, CAPILLARY
Glucose-Capillary: 140 mg/dL — ABNORMAL HIGH (ref 65–99)
Glucose-Capillary: 183 mg/dL — ABNORMAL HIGH (ref 65–99)

## 2017-03-12 LAB — HEMOGLOBIN A1C
Hgb A1c MFr Bld: 7.1 % — ABNORMAL HIGH (ref 4.8–5.6)
Mean Plasma Glucose: 157 mg/dL

## 2017-03-12 LAB — PROTIME-INR
INR: 1.08
Prothrombin Time: 14 seconds (ref 11.4–15.2)

## 2017-03-12 LAB — HEPARIN LEVEL (UNFRACTIONATED): Heparin Unfractionated: 0.65 IU/mL (ref 0.30–0.70)

## 2017-03-12 MED ORDER — APIXABAN 5 MG PO TABS: 10.0000 mg | ORAL_TABLET | Freq: Two times a day (BID) | ORAL | Status: DC

## 2017-03-12 MED ORDER — APIXABAN 5 MG PO TABS: 5.0000 mg | ORAL_TABLET | Freq: Two times a day (BID) | ORAL | Status: DC

## 2017-03-12 MED ORDER — GABAPENTIN 300 MG PO CAPS: 600.0000 mg | ORAL_CAPSULE | Freq: Two times a day (BID) | ORAL | 0 refills | Status: DC

## 2017-03-12 MED ORDER — APIXABAN 5 MG PO TABS: ORAL_TABLET | ORAL | 0 refills | Status: DC

## 2017-03-12 MED ORDER — TRIAMCINOLONE 0.1 % CREAM:EUCERIN CREAM 1:1: 1.0000 "application " | TOPICAL_CREAM | Freq: Three times a day (TID) | CUTANEOUS | 0 refills | Status: DC | PRN

## 2017-03-12 MED ORDER — APIXABAN 5 MG PO TABS
10.0000 mg | ORAL_TABLET | Freq: Once | ORAL | Status: AC
  Administered 2017-03-12: 10 mg via ORAL
  Filled 2017-03-12: qty 2

## 2017-03-12 NOTE — Discharge Summary (Signed)
Physician Discharge Summary  LIOR CARTELLI WUX:[id number] DOB: 1946/03/02 DOA: 03/10/2017  PCP: Leanna Battles, MD  Admit date: 03/10/2017 Discharge date: 03/12/2017  Admitted From: Home Disposition: Home  Recommendations for Outpatient Follow-up:  1. Follow up with PCP in 1 week 2. Anticoagulation for 3 months 3. Follow up of foot fracture with podiatry 4. Consider restarting lisinopril 5. Recheck creatinine  Home Health: RN, Physical therapy Equipment/Devices: None  Discharge Condition: Stable CODE STATUS: Full code Diet recommendation: Heart healthy   Brief/Interim Summary:  Admission HPI written by Vianne Bulls, MD   Chief Complaint: Lower left leg swelling, redness, pain   HPI: Jean Mcclain is a 71 y.o. female with medical history significant for hypertension, insulin-dependent diabetes mellitus, restless leg syndrome, chronic kidney disease, and chronic bilateral lower extremity edema, now presenting to the emergency department for evaluation of pain, swelling, and redness involving the lower left leg. Patient reports that she had been in her usual state of health until she noted the insidious development of pain and swelling involving the left ankle approximately 2 months ago. She was unable to fit her left foot into her shoes secondary to the swelling and has been wearing an orthopedic boot that she had at home. She saw her podiatrist for evaluation and underwent radiographs one week ago that demonstrated likely fibular and navicular fractures. She was advised by podiatry that she would likely require surgery for this, but may not be a surgical candidate due to her morbid obesity. She was advised to wear a cam boot which was applied in the clinic on 03/02/2017. Since that time, the pain and swelling has increased significantly and she is also developed significant erythema at the left lower leg. She returned to podiatry today for evaluation of these changes and was sent for  venous ultrasound of the left lower extremity. She was notified that this study was positive for left popliteal DVT and she was directed to the ED for further evaluation of this. The patient notes dyspnea on exertion which is chronic, but has worsened over the past several days. She also notes the new development of wheezing. She denies chest pain or palpitations. She denies history of prior DVT or PE. She has never been on a anticoagulant. She does not know what her baseline creatinine is.  ED Course: Upon arrival to the ED, patient is found to be afebrile, saturating well on room air, and with vital signs stable. Chemistry panels notable for serum creatinine at 2.11 with no recent prior for comparison. CBC is unremarkable. Patient was started on heparin infusion. She remained hemodynamically stable and has not been in acute respiratory distress. She will be admitted to the telemetry unit for ongoing evaluation and management of acute left popliteal DVT in the setting of left foot and ankle fractures, with worsening in her exertional dyspnea and wheezing concerning for possible PE.    Hospital course:  Acute DVT DVT located in left popliteal vein. Patient started on heparin infusion and transitioned to Eliquis at discharge. Discussed with nephrology with regard to anticoagulation regimen.  Acute kidney injury on CKD stage III Baseline creatinine of 1.7-1.8 per [facility] outpatient records. Creatinine of 2.11 on admission and improved to 1.76 on discharge.  Insulin-dependent DM Levemir and Novolog at reduced dose while inpatient with controlled diet. Resume home regimen at discharge.  Essential hypertension Continued Norvasc. Lisinopril held secondary to renal insufficiency.  Chronic leg edema Held lasix secondary to renal insufficiency.   Dyspnea  on exertion Chronic. On room air. Stable during admission.   Discharge Diagnoses:  Principal Problem:   Acute deep vein  thrombosis (DVT) of popliteal vein of left lower extremity (HCC) Active Problems:   Renal insufficiency   Insulin-requiring or dependent type II diabetes mellitus ([place])   Essential hypertension    Discharge Instructions  Discharge Instructions    Diet - low sodium heart healthy    Complete by:  As directed    Increase activity slowly    Complete by:  As directed      Allergies as of 03/12/2017   No Known Allergies     Medication List    STOP taking these medications   furosemide 40 MG tablet Commonly known as:  LASIX   gabapentin 600 MG tablet Commonly known as:  NEURONTIN Replaced by:  gabapentin 300 MG capsule   lisinopril 5 MG tablet Commonly known as:  PRINIVIL,ZESTRIL     TAKE these medications   Alcohol Pads 70 % Pads   allopurinol 100 MG tablet Commonly known as:  ZYLOPRIM Take 100 mg by mouth daily.   apixaban 5 MG Tabs tablet Commonly known as:  ELIQUIS Take 2 tablets (10 mg total) by mouth 2 (two) times daily. Then on 03/17/17, take 1 tabet (5 mg) two times daily.   colchicine 0.6 MG tablet Take 0.6 mg by mouth daily.   cyclobenzaprine 10 MG tablet Commonly known as:  FLEXERIL Take 10 mg by mouth at bedtime.   EASY COMFORT LANCETS Misc   EASY TALK BLOOD GLUCOSE SYSTEM Devi   ergocalciferol 50000 units capsule Commonly known as:  VITAMIN D2 Take 50,000 Units by mouth once a week. Patient takes on Fridays   febuxostat 40 MG tablet Commonly known as:  ULORIC Take 40 mg by mouth daily.   gabapentin 300 MG capsule Commonly known as:  NEURONTIN Take 2 capsules (600 mg total) by mouth 2 (two) times daily. Replaces:  gabapentin 600 MG tablet   insulin NPH Human 100 UNIT/ML injection Commonly known as:  HUMULIN N,NOVOLIN N Inject 15-75 Units into the skin See admin instructions. Use 65 units every morning then use 15 units at lunch then use 75 units at dinner   insulin regular 100 units/mL injection Commonly known as:  NOVOLIN R,HUMULIN  R Inject 30 Units into the skin 3 (three) times daily before meals.   lovastatin 40 MG tablet Commonly known as:  MEVACOR Take 40 mg by mouth at bedtime.   omeprazole 20 MG capsule Commonly known as:  PRILOSEC Take 20 mg by mouth daily.   rOPINIRole 4 MG tablet Commonly known as:  REQUIP Take 4 mg by mouth 2 (two) times daily.   triamcinolone 0.1 % cream : eucerin Crea Apply 1 application topically 3 (three) times daily as needed for itching. Use for two weeks      Follow-up Information    Leanna Battles, MD. Schedule an appointment as soon as possible for a visit in 1 week(s).   Specialty:  Internal Medicine Contact information: [address] [place] [address] [phone]          No Known Allergies  Consultations:  Nephrology (Phone only)  Pharmacy   Procedures/Studies: Dg Chest 2 View  Result Date: 03/11/2017 CLINICAL DATA:  Acute onset of wheezing. Known DVT. Initial encounter. EXAM: CHEST  2 VIEW COMPARISON:  Chest radiograph performed 10/01/2009 FINDINGS: The lungs are well-aerated. Pulmonary vascularity is at the upper limits of normal. There is no evidence of focal  opacification, pleural effusion or pneumothorax. The heart is normal in size; the mediastinal contour is within normal limits. No acute osseous abnormalities are seen. Anterior bridging osteophytes are noted along the lower thoracic spine. IMPRESSION: No acute cardiopulmonary process seen. Electronically Signed   By: Garald Balding M.D.   On: 03/11/2017 01:03   Dg Ankle 2 Views Left  Result Date: 03/02/2017 Please see detailed radiograph report in office note.  Dg Foot Complete Left  Result Date: 03/02/2017 Please see detailed radiograph report in office note.     Subjective: Patient reports no chest pain or dyspnea.   Discharge Exam: Vitals:   03/12/17 1100 03/12/17 1249  BP: (!) 142/53 (!) 145/58  Pulse: 79 82  Resp:    Temp: 98.2 F (36.8 C)    Vitals:   03/12/17  0505 03/12/17 0931 03/12/17 1100 03/12/17 1249  BP: (!) 148/75 (!) 140/59 (!) 142/53 (!) 145/58  Pulse: 94 85 79 82  Resp: 18     Temp: 98.1 F (36.7 C) 98 F (36.7 C) 98.2 F (36.8 C)   TempSrc: Oral Oral Oral   SpO2: 98% 93% 93% 96%  Weight: 112.9 kg (248 lb 12.8 oz)     Height:        General: Pt is alert, awake, not in acute distress Cardiovascular: RRR, S1/S2 +, no rubs, no gallops Respiratory: CTA bilaterally, no wheezing, no rhonchi Abdominal: Soft, NT, ND, bowel sounds + Extremities: bilateral edema without pitting, no cyanosis    The results of significant diagnostics from this hospitalization (including imaging, microbiology, ancillary and laboratory) are listed below for reference.     Labs: Basic Metabolic Panel:  Recent Labs Lab 03/10/17 2311 03/11/17 0714 03/12/17 0719  NA 140 139 138  K 4.1 4.1 4.2  CL 102 104 103  CO2 29 25 27   GLUCOSE 88 187* 156*  BUN 41* 38* 32*  CREATININE 2.11* 1.95* 1.76*  CALCIUM 8.8* 8.7* 8.8*   CBC:  Recent Labs Lab 03/10/17 2311 03/12/17 0719  WBC 7.3 6.3  HGB 12.3 12.0  HCT 39.3 38.4  MCV 94.2 93.9  PLT 248 216   Cardiac Enzymes:  Recent Labs Lab 03/11/17 0026 03/11/17 0714 03/11/17 1248  TROPONINI <0.03 <0.03 <0.03   BNP: Invalid input(s): POCBNP CBG:  Recent Labs Lab 03/11/17 1129 03/11/17 1627 03/11/17 2046 03/12/17 0719 03/12/17 1124  GLUCAP 216* 184* 222* 140* 183*   Hgb A1c  Recent Labs  03/11/17 0028  HGBA1C 7.1*   Urinalysis    Component Value Date/Time   COLORURINE YELLOW 10/01/2009 1740   APPEARANCEUR CLEAR 10/01/2009 1740   LABSPEC 1.016 10/01/2009 1740   PHURINE 6.0 10/01/2009 1740   GLUCOSEU NEGATIVE 10/01/2009 1740   HGBUR NEGATIVE 10/01/2009 1740   BILIRUBINUR NEGATIVE 10/01/2009 1740   KETONESUR NEGATIVE 10/01/2009 1740   PROTEINUR NEGATIVE 10/01/2009 1740   UROBILINOGEN 0.2 10/01/2009 1740   NITRITE NEGATIVE 10/01/2009 1740   LEUKOCYTESUR  10/01/2009 1740     NEGATIVE MICROSCOPIC NOT DONE ON URINES WITH NEGATIVE PROTEIN, BLOOD, LEUKOCYTES, NITRITE, OR GLUCOSE <1000 mg/dL.     Time coordinating discharge: Over 30 minutes  SIGNED:   Cordelia Poche, MD [facility] 03/12/2017, 3:47 PM Pager [phone]  If 7PM-7AM, please contact night-coverage [email or web address] Password [email or web address]

## 2017-03-12 NOTE — Discharge Instructions (Signed)
Jean Mcclain,  You were admitted because of a deep vein thrombosis in your leg. You will need 3 months of blood thinner (anticoagulation). Please follow-up with your primary care physician.   Deep Vein Thrombosis A deep vein thrombosis (DVT) is a blood clot (thrombus) that usually occurs in a deep, larger vein of the lower leg or the pelvis, or in an upper extremity such as the arm. These are dangerous and can lead to serious and even life-threatening complications if the clot travels to the lungs. A DVT can damage the valves in your leg veins so that instead of flowing upward, the blood pools in the lower leg. This is called post-thrombotic syndrome, and it can result in pain, swelling, discoloration, and sores on the leg. What are the causes? A DVT is caused by the formation of a blood clot in your leg, pelvis, or arm. Usually, several things contribute to the formation of blood clots. A clot may develop when:  Your blood flow slows down.  Your vein becomes damaged in some way.  You have a condition that makes your blood clot more easily.  What increases the risk? A DVT is more likely to develop in:  People who are older, especially over 10 years of age.  People who are overweight (obese).  People who sit or lie still for a long time, such as during long-distance travel (over 4 hours), bed rest, hospitalization, or during recovery from certain medical conditions like a stroke.  People who do not engage in much physical activity (sedentary lifestyle).  People who have chronic breathing disorders.  People who have a personal or family history of blood clots or blood clotting disease.  People who have peripheral vascular disease (PVD), diabetes, or some types of cancer.  People who have heart disease, especially if the person had a recent heart attack or has congestive heart failure.  People who have neurological diseases that affect the legs (leg paresis).  People who have  had a traumatic injury, such as breaking a hip or leg.  People who have recently had major or lengthy surgery, especially on the hip, knee, or abdomen.  People who have had a central line placed inside a large vein.  People who take medicines that contain the hormone estrogen. These include birth control pills and hormone replacement therapy.  Pregnancy or during childbirth or the postpartum period.  Long plane flights (over 8 hours).  What are the signs or symptoms?  Symptoms of a DVT can include:  Swelling of your leg or arm, especially if one side is much worse.  Warmth and redness of your leg or arm, especially if one side is much worse.  Pain in your arm or leg. If the clot is in your leg, symptoms may be more noticeable or worse when you stand or walk.  A feeling of pins and needles, if the clot is in the arm.  The symptoms of a DVT that has traveled to the lungs (pulmonary embolism, PE) usually start suddenly and include:  Shortness of breath while active or at rest.  Coughing or coughing up blood or blood-tinged mucus.  Chest pain that is often worse with deep breaths.  Rapid or irregular heartbeat.  Feeling light-headed or dizzy.  Fainting.  Feeling anxious.  Sweating.  There may also be pain and swelling in a leg if that is where the blood clot started. These symptoms may represent a serious problem that is an emergency. Do not wait to  see if the symptoms will go away. Get medical help right away. Call your local emergency services (911 in the [place] Do not drive yourself to the hospital. How is this diagnosed? Your health care provider will take a medical history and perform a physical exam. You may also have other tests, including:  Blood tests to assess the clotting properties of your blood.  Imaging tests, such as CT, ultrasound, MRI, X-ray, and other tests to see if you have clots anywhere in your body.  How is this treated? After a DVT is  identified, it can be treated. The type of treatment that you receive depends on many factors, such as the cause of your DVT, your risk for bleeding or developing more clots, and other medical conditions that you have. Sometimes, a combination of treatments is necessary. Treatment options may be combined and include:  Monitoring the blood clot with ultrasound.  Taking medicines by mouth, such as newer blood thinners (anticoagulants), thrombolytics, or warfarin.  Taking anticoagulant medicine by injection or through an IV tube.  Wearing compression stockings or using different types ofdevices.  Surgery (rare) to remove the blood clot or to place a filter in your abdomen to stop the blood clot from traveling to your lungs.  Treatments for a DVT are often divided into immediate treatment and long-term treatment (up to 3 months after DVT). You can work with your health care provider to choose the treatment program that is best for you. Follow these instructions at home: If you are taking a newer oral anticoagulant:  Take the medicine every single day at the same time each day.  Understand what foods and drugs interact with this medicine.  Understand that there are no regular blood tests required when using this medicine.  Understand the side effects of this medicine, including excessive bruising or bleeding. Ask your health care provider or pharmacist about other possible side effects. If you are taking warfarin:  Understand how to take warfarin and know which foods can affect how warfarin works in Veterinary surgeon.  Understand that it is dangerous to take too much or too little warfarin. Too much warfarin increases the risk of bleeding. Too little warfarin continues to allow the risk for blood clots.  Follow your PT and INR blood testing schedule. The PT and INR results allow your health care provider to adjust your dose of warfarin. It is very important that you have your PT and INR tested as  often as told by your health care provider.  Avoid major changes in your diet, or tell your health care provider before you change your diet. Arrange a visit with a registered dietitian to answer your questions. Many foods, especially foods that are high in vitamin K, can interfere with warfarin and affect the PT and INR results. Eat a consistent amount of foods that are high in vitamin K, such as: ? Spinach, kale, broccoli, cabbage, collard greens, turnip greens, Brussels sprouts, peas, cauliflower, seaweed, and parsley. ? Beef liver and pork liver. ? Green tea. ? Soybean oil.  Tell your health care provider about any and all medicines, vitamins, and supplements that you take, including aspirin and other over-the-counter anti-inflammatory medicines. Be especially cautious with aspirin and anti-inflammatory medicines. Do not take those before you ask your health care provider if it is safe to do so. This is important because many medicines can interfere with warfarin and affect the PT and INR results.  Do not start or stop taking any over-the-counter or  prescription medicine unless your health care provider or pharmacist tells you to do so. If you take warfarin, you will also need to do these things:  Hold pressure over cuts for longer than usual.  Tell your dentist and other health care providers that you are taking warfarin before you have any procedures in which bleeding may occur.  Avoid alcohol or drink very small amounts. Tell your health care provider if you change your alcohol intake.  Do not use tobacco products, including cigarettes, chewing tobacco, and e-cigarettes. If you need help quitting, ask your health care provider.  Avoid contact sports.  General instructions  Take over-the-counter and prescription medicines only as told by your health care provider. Anticoagulant medicines can have side effects, including easy bruising and difficulty stopping bleeding. If you are  prescribed an anticoagulant, you will also need to do these things: ? Hold pressure over cuts for longer than usual. ? Tell your dentist and other health care providers that you are taking anticoagulants before you have any procedures in which bleeding may occur. ? Avoid contact sports.  Wear a medical alert bracelet or carry a medical alert card that says you have had a PE.  Ask your health care provider how soon you can go back to your normal activities. Stay active to prevent new blood clots from forming.  Make sure to exercise while traveling or when you have been sitting or standing for a long period of time. It is very important to exercise. Exercise your legs by walking or by tightening and relaxing your leg muscles often. Take frequent walks.  Wear compression stockings as told by your health care provider to help prevent more blood clots from forming.  Do not use tobacco products, including cigarettes, chewing tobacco, and e-cigarettes. If you need help quitting, ask your health care provider.  Keep all follow-up appointments with your health care provider. This is important. How is this prevented? Take these actions to decrease your risk of developing another DVT:  Exercise regularly. For at least 30 minutes every day, engage in: ? Activity that involves moving your arms and legs. ? Activity that encourages good blood flow through your body by increasing your heart rate.  Exercise your arms and legs every hour during long-distance travel (over 4 hours). Drink plenty of water and avoid drinking alcohol while traveling.  Avoid sitting or lying in bed for long periods of time without moving your legs.  Maintain a weight that is appropriate for your height. Ask your health care provider what weight is healthy for you.  If you are a woman who is over 56 years of age, avoid unnecessary use of medicines that contain estrogen. These include birth control pills.  Do not smoke,  especially if you take estrogen medicines. If you need help quitting, ask your health care provider.  If you are hospitalized, prevention measures may include:  Early walking after surgery, as soon as your health care provider says that it is safe.  Receiving anticoagulants to prevent blood clots.If you cannot take anticoagulants, other options may be available, such as wearing compression stockings or using different types of devices.  Get help right away if:  You have new or increased pain, swelling, or redness in an arm or leg.  You have numbness or tingling in an arm or leg.  You have shortness of breath while active or at rest.  You have chest pain.  You have a rapid or irregular heartbeat.  You feel light-headed  or dizzy.  You cough up blood.  You notice blood in your vomit, bowel movement, or urine. These symptoms may represent a serious problem that is an emergency. Do not wait to see if the symptoms will go away. Get medical help right away. Call your local emergency services (911 in the [place] Do not drive yourself to the hospital. This information is not intended to replace advice given to you by your health care provider. Make sure you discuss any questions you have with your health care provider. Document Released: 09/26/2005 Document Revised: 03/03/2016 Document Reviewed: 01/21/2015 Elsevier Interactive Patient Education  2017 [employer].

## 2017-03-12 NOTE — Progress Notes (Signed)
Patient with no complaints or concerns during 7pm - 7am shift.  Greig Altergott, RN 

## 2017-03-12 NOTE — Progress Notes (Signed)
ANTICOAGULATION CONSULT NOTE - Follow Up Consult  Pharmacy Consult for heparin>> apixiban Indication: DVT  No Known Allergies  Patient Measurements: Height: 5' (152.4 cm) Weight: 248 lb 12.8 oz (112.9 kg) (scale c) IBW/kg (Calculated) : 45.5 Heparin Dosing Weight: 73.5 kg  Vital Signs: Temp: 98.2 F (36.8 C) (06/03 1100) Temp Source: Oral (06/03 1100) BP: 145/58 (06/03 1249) Pulse Rate: 82 (06/03 1249)  Labs:  Recent Labs  03/10/17 2311 03/11/17 0026 03/11/17 0714 03/11/17 1248 03/11/17 2244 03/12/17 0718 03/12/17 0719  HGB 12.3  --   --   --   --   --  12.0  HCT 39.3  --   --   --   --   --  38.4  PLT 248  --   --   --   --   --  216  LABPROT  --   --   --   --   --   --  14.0  INR  --   --   --   --   --   --  1.08  HEPARINUNFRC  --   --   --  <0.10* 0.19* 0.65  --   CREATININE 2.11*  --  1.95*  --   --   --  1.76*  TROPONINI  --  <0.03 <0.03 <0.03  --   --   --     Estimated Creatinine Clearance: 33.6 mL/min (A) (by C-G formula based on SCr of 1.76 mg/dL (H)).  Assessment: 60 yoF with new L popliteal DVT on heparin gtt. Pharmacy consulted to dose apixiban -SCr= 1.76 (down; SCr was 1.4 in 2014)  Goal of Therapy:  Heparin level 0.3-0.7 units/ml Monitor platelets by anticoagulation protocol: Yes   Plan:  -apixiban 10mg  po bid for 7 days then 5mg  po bid -Will follow patient progress  Hildred Laser, Pharm D 03/12/2017 2:46 PM

## 2017-03-12 NOTE — Progress Notes (Signed)
ANTICOAGULATION CONSULT NOTE - Follow Up Consult  Pharmacy Consult for heparin Indication: DVT  No Known Allergies  Patient Measurements: Height: 5' (152.4 cm) Weight: 248 lb 12.8 oz (112.9 kg) (scale c) IBW/kg (Calculated) : 45.5 Heparin Dosing Weight: 73.5 kg  Vital Signs: Temp: 98.1 F (36.7 C) (06/03 0505) Temp Source: Oral (06/03 0505) BP: 148/75 (06/03 0505) Pulse Rate: 94 (06/03 0505)  Labs:  Recent Labs  03/10/17 2311 03/11/17 0026 03/11/17 0714 03/11/17 1248 03/11/17 2244 03/12/17 0718 03/12/17 0719  HGB 12.3  --   --   --   --   --  12.0  HCT 39.3  --   --   --   --   --  38.4  PLT 248  --   --   --   --   --  216  LABPROT  --   --   --   --   --   --  14.0  INR  --   --   --   --   --   --  1.08  HEPARINUNFRC  --   --   --  <0.10* 0.19* 0.65  --   CREATININE 2.11*  --  1.95*  --   --   --  1.76*  TROPONINI  --  <0.03 <0.03 <0.03  --   --   --     Estimated Creatinine Clearance: 33.6 mL/min (A) (by C-G formula based on SCr of 1.76 mg/dL (H)).  Assessment: 60 yoF with new L popliteal DVT on heparin gtt. Heparin level therapeutic from second rebolus and rate increase. CBC stable. No bleeding per nurse.   Goal of Therapy:  Heparin level 0.3-0.7 units/ml Monitor platelets by anticoagulation protocol: Yes   Plan:  Continue heparin 1500 units/hr F/u heparin level 8 hours Daily HL and CBC F/u oral anticoagulant plans  Dierdre Harness, BS, PharmD Clinical Pharmacy Resident [phone] (Pager) 03/12/2017 8:24 AM

## 2017-03-13 ENCOUNTER — Emergency Department (HOSPITAL_COMMUNITY)
Admission: EM | Admit: 2017-03-13 | Discharge: 2017-03-13 | Disposition: A | Discharge status: Home / Self Care | Payer: PPO | Attending: Emergency Medicine | Admitting: Emergency Medicine

## 2017-03-13 ENCOUNTER — Encounter (HOSPITAL_COMMUNITY): Payer: Self-pay | Admitting: Emergency Medicine

## 2017-03-13 DIAGNOSIS — E119 Type 2 diabetes mellitus without complications: Secondary | ICD-10-CM | POA: Diagnosis not present

## 2017-03-13 DIAGNOSIS — Z794 Long term (current) use of insulin: Secondary | ICD-10-CM | POA: Diagnosis not present

## 2017-03-13 DIAGNOSIS — Z87891 Personal history of nicotine dependence: Secondary | ICD-10-CM | POA: Insufficient documentation

## 2017-03-13 DIAGNOSIS — N390 Urinary tract infection, site not specified: Secondary | ICD-10-CM | POA: Diagnosis not present

## 2017-03-13 DIAGNOSIS — R319 Hematuria, unspecified: Secondary | ICD-10-CM | POA: Diagnosis present

## 2017-03-13 DIAGNOSIS — N3001 Acute cystitis with hematuria: Secondary | ICD-10-CM | POA: Diagnosis not present

## 2017-03-13 DIAGNOSIS — R310 Gross hematuria: Secondary | ICD-10-CM | POA: Diagnosis not present

## 2017-03-13 DIAGNOSIS — Z79899 Other long term (current) drug therapy: Secondary | ICD-10-CM | POA: Diagnosis not present

## 2017-03-13 DIAGNOSIS — Z96653 Presence of artificial knee joint, bilateral: Secondary | ICD-10-CM | POA: Insufficient documentation

## 2017-03-13 DIAGNOSIS — I10 Essential (primary) hypertension: Secondary | ICD-10-CM | POA: Insufficient documentation

## 2017-03-13 DIAGNOSIS — Z7901 Long term (current) use of anticoagulants: Secondary | ICD-10-CM | POA: Insufficient documentation

## 2017-03-13 LAB — CBC WITH DIFFERENTIAL/PLATELET
Basophils Absolute: 0 10*3/uL (ref 0.0–0.1)
Basophils Relative: 0 %
Eosinophils Absolute: 0.2 10*3/uL (ref 0.0–0.7)
Eosinophils Relative: 2 %
HCT: 39.4 % (ref 36.0–46.0)
Hemoglobin: 12.3 g/dL (ref 12.0–15.0)
Lymphocytes Relative: 21 %
Lymphs Abs: 1.6 10*3/uL (ref 0.7–4.0)
MCH: 29.6 pg (ref 26.0–34.0)
MCHC: 31.2 g/dL (ref 30.0–36.0)
MCV: 94.7 fL (ref 78.0–100.0)
Monocytes Absolute: 0.5 10*3/uL (ref 0.1–1.0)
Monocytes Relative: 7 %
Neutro Abs: 5.2 10*3/uL (ref 1.7–7.7)
Neutrophils Relative %: 70 %
Platelets: 245 10*3/uL (ref 150–400)
RBC: 4.16 MIL/uL (ref 3.87–5.11)
RDW: 15.8 % — ABNORMAL HIGH (ref 11.5–15.5)
WBC: 7.5 10*3/uL (ref 4.0–10.5)

## 2017-03-13 LAB — URINALYSIS, ROUTINE W REFLEX MICROSCOPIC
Bilirubin Urine: NEGATIVE
Glucose, UA: NEGATIVE mg/dL
Ketones, ur: NEGATIVE mg/dL
Nitrite: NEGATIVE
Protein, ur: 100 mg/dL — AB
Specific Gravity, Urine: 1.016 (ref 1.005–1.030)
pH: 6 (ref 5.0–8.0)

## 2017-03-13 LAB — BASIC METABOLIC PANEL
Anion gap: 7 (ref 5–15)
BUN: 27 mg/dL — ABNORMAL HIGH (ref 6–20)
CO2: 31 mmol/L (ref 22–32)
Calcium: 9.4 mg/dL (ref 8.9–10.3)
Chloride: 103 mmol/L (ref 101–111)
Creatinine, Ser: 1.87 mg/dL — ABNORMAL HIGH (ref 0.44–1.00)
GFR calc Af Amer: 30 mL/min — ABNORMAL LOW (ref >60)
GFR calc non Af Amer: 26 mL/min — ABNORMAL LOW (ref >60)
Glucose, Bld: 110 mg/dL — ABNORMAL HIGH (ref 65–99)
Potassium: 4 mmol/L (ref 3.5–5.1)
Sodium: 141 mmol/L (ref 135–145)

## 2017-03-13 LAB — PROTIME-INR
INR: 1.26
Prothrombin Time: 15.9 seconds — ABNORMAL HIGH (ref 11.4–15.2)

## 2017-03-13 MED ORDER — CEPHALEXIN 500 MG PO CAPS: 500.0000 mg | ORAL_CAPSULE | Freq: Two times a day (BID) | ORAL | 0 refills | Status: AC

## 2017-03-13 MED ORDER — CEPHALEXIN 250 MG PO CAPS
500.0000 mg | ORAL_CAPSULE | Freq: Once | ORAL | Status: AC
  Administered 2017-03-13: 500 mg via ORAL
  Filled 2017-03-13: qty 2

## 2017-03-13 NOTE — Discharge Instructions (Signed)
Please take antibiotics for UTI. Please follow-up with the urologist for ongoing bleeding in the urine. Please return without fail for worsening symptoms, including fever, severe abdominal pain, retention of urine, vomiting, blood in stools or any other symptoms concerning to you. Please also follow-up closely with the PCP.

## 2017-03-13 NOTE — ED Provider Notes (Signed)
[facility] DEPT Provider Note   CSN: [id number] Arrival date & time: 03/13/17  1350     History   Chief Complaint Chief Complaint  Patient presents with  . Hematuria    HPI Jean Mcclain is a 71 y.o. female.  The history is provided by the patient.  Hematuria  This is a new problem. The current episode started 3 to 5 hours ago. Episode frequency: once. The problem has not changed since onset.Pertinent negatives include no chest pain, no abdominal pain and no shortness of breath. Nothing aggravates the symptoms. Nothing relieves the symptoms. She has tried nothing for the symptoms.    71 year old female who presents with hematuria. She was just discharged from the hospital one day ago after hospitalization for an acute DVT of the left popliteal vein. She was started on Eliquis. Today had episode of hematuria, described as bright red blood in the toilet bowl. No hematochezia or melena. No chest pain or difficulty breathing. No dysuria, urinary frequency, difficulty urinating, urinary retention, abdominal pain, or flank pain. No fevers or chills, nausea or vomiting.  Past Medical History:  Diagnosis Date  . Arthritis   . Diabetes mellitus, type 2 ([place])   . 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 GERD (gastroesophageal reflux disease)   . Hyperlipidemia   . Hypertension   . OSA (obstructive sleep apnea)    does not use CPAP   . Osteopenia   . Shortness of breath    with exertion  . Vitamin D deficiency     Patient Active Problem List   Diagnosis Date Noted  . Acute deep vein thrombosis (DVT) of popliteal vein of left lower extremity ([place]) 03/11/2017  . Renal insufficiency 03/11/2017  . Insulin-requiring or dependent type II diabetes mellitus ([place]) 03/11/2017  . Essential hypertension 03/11/2017  . Impingement syndrome of left ankle 01/31/2017  . Posterior tibial tendinitis, left leg 01/31/2017  . Incarcerated ventral hernia 01/02/2013    Past Surgical History:  Procedure  Laterality Date  . BREAST BIOPSY  1998   Rightx 2  . INSERTION OF MESH N/A 01/08/2013   Procedure: INSERTION OF MESH;  Surgeon: Earnstine Regal, MD;  Location: [facility];  Service: General;  Laterality: N/A;  . JOINT REPLACEMENT     bilateral knee replacements- 1999  . TOTAL KNEE ARTHROPLASTY  1999   Left  . VENTRAL HERNIA REPAIR N/A 01/08/2013   Procedure: LAPAROSCOPIC VENTRAL HERNIA;  Surgeon: Earnstine Regal, MD;  Location: [facility];  Service: General;  Laterality: N/A;    OB History    No data available       Home Medications    Prior to Admission medications   Medication Sig Start Date End Date Taking? Authorizing Provider  allopurinol (ZYLOPRIM) 100 MG tablet Take 100 mg by mouth daily. 02/24/17  Yes [provider]  apixaban (ELIQUIS) 5 MG TABS tablet Take 2 tablets (10 mg total) by mouth 2 (two) times daily. Then on 03/17/17, take 1 tabet (5 mg) two times daily. 03/12/17  Yes Mariel Aloe, MD  colchicine 0.6 MG tablet Take 0.6 mg by mouth daily. 02/21/17  Yes [provider]  cyclobenzaprine (FLEXERIL) 10 MG tablet Take 10 mg by mouth at bedtime.   Yes [provider]  ergocalciferol (VITAMIN D2) 50000 UNITS capsule Take 50,000 Units by mouth once a week. Patient takes on Fridays   Yes [provider]  febuxostat (ULORIC) 40 MG tablet Take 40 mg by mouth daily.  Yes [provider]  gabapentin (NEURONTIN) 300 MG capsule Take 2 capsules (600 mg total) by mouth 2 (two) times daily. 03/12/17  Yes Mariel Aloe, MD  insulin NPH Human (HUMULIN N,NOVOLIN N) 100 UNIT/ML injection Inject 15-75 Units into the skin See admin instructions. Use 65 units every morning then use 15 units at lunch then use 75 units at dinner   Yes [provider]  insulin regular (NOVOLIN R,HUMULIN R) 100 units/mL injection Inject 30 Units into the skin 3 (three) times daily before meals.   Yes [provider]  lovastatin (MEVACOR) 40 MG tablet Take 40 mg by  mouth at bedtime.    Yes [provider]  omeprazole (PRILOSEC) 20 MG capsule Take 20 mg by mouth daily.   Yes [provider]  rOPINIRole (REQUIP) 4 MG tablet Take 4 mg by mouth 2 (two) times daily.    Yes [provider]  Triamcinolone Acetonide (TRIAMCINOLONE 0.1 % CREAM : EUCERIN) CREA Apply 1 application topically 3 (three) times daily as needed for itching. Use for two weeks 03/12/17  Yes Mariel Aloe, MD  cephALEXin (KEFLEX) 500 MG capsule Take 1 capsule (500 mg total) by mouth 2 (two) times daily. 03/13/17 03/20/17  Forde Dandy, MD    Family History Family History  Problem Relation Age of Onset  . Leukemia Mother   . Hypertension Brother   . Emphysema Father     Social History Social History  Substance Use Topics  . Smoking status: Former Smoker    Years: 27.00    Types: Cigarettes    Quit date: 10/10/1981  . Smokeless tobacco: Never Used  . Alcohol use No     Allergies   Patient has no known allergies.   Review of Systems Review of Systems  Constitutional: Negative for fever.  Respiratory: Negative for shortness of breath.   Cardiovascular: Negative for chest pain.  Gastrointestinal: Negative for abdominal pain.  Genitourinary: Positive for hematuria.  Hematological: Bruises/bleeds easily.  All other systems reviewed and are negative.    Physical Exam Updated Vital Signs BP (!) 118/56   Pulse 88   Temp 98.2 F (36.8 C) (Oral)   Resp 16   Ht 5' (1.524 m)   Wt 113.4 kg (250 lb)   SpO2 96%   BMI 48.82 kg/m   Physical Exam Physical Exam  Nursing note and vitals reviewed. Constitutional: Well developed, well nourished, non-toxic, and in no acute distress Head: Normocephalic and atraumatic.  Mouth/Throat: Oropharynx is clear and moist.  Neck: Normal range of motion. Neck supple.  Cardiovascular: Normal rate and regular rhythm.   Pulmonary/Chest: Effort normal and breath sounds normal.  Abdominal: Soft. There is no  tenderness. There is no rebound and no guarding. no cva tenderness Musculoskeletal: Normal range of motion.  Neurological: Alert, no facial droop, fluent speech, moves all extremities symmetrically Skin: Skin is warm and dry.  Psychiatric: Cooperative   ED Treatments / Results  Labs (all labs ordered are listed, but only abnormal results are displayed) Labs Reviewed  URINALYSIS, ROUTINE W REFLEX MICROSCOPIC - Abnormal; Notable for the following:       Result Value   APPearance CLOUDY (*)    Hgb urine dipstick LARGE (*)    Protein, ur 100 (*)    Leukocytes, UA LARGE (*)    Bacteria, UA MANY (*)    Squamous Epithelial / LPF 0-5 (*)    All other components within normal limits  CBC WITH DIFFERENTIAL/PLATELET -  Abnormal; Notable for the following:    RDW 15.8 (*)    All other components within normal limits  BASIC METABOLIC PANEL - Abnormal; Notable for the following:    Glucose, Bld 110 (*)    BUN 27 (*)    Creatinine, Ser 1.87 (*)    GFR calc non Af Amer 26 (*)    GFR calc Af Amer 30 (*)    All other components within normal limits  PROTIME-INR - Abnormal; Notable for the following:    Prothrombin Time 15.9 (*)    All other components within normal limits  URINE CULTURE    EKG  EKG Interpretation None       Radiology No results found.  Procedures Procedures (including critical care time)  Medications Ordered in ED Medications  cephALEXin (KEFLEX) capsule 500 mg (not administered)     Initial Impression / Assessment and Plan / ED Course  I have reviewed the triage vital signs and the nursing notes.  Pertinent labs & imaging results that were available during my care of the patient were reviewed by me and considered in my medical decision making (see chart for details).     Presenting with hematuria for one day after recently starting on Xarelto. She is afebrile, hemodynamically stable. Kidney function is stable and with stable hemoglobin. No signs of urinary  retention. UA is suggestive of a UTI, and is sent for culture. It may also be etiology of her hematuria. No signs or symptoms of pyelonephritis. We'll treat with course of Keflex. Urology follow-up is provided. Patient to continue on Xarelto at this time. Strict return and follow-up instructions reviewed. She expressed understanding of all discharge instructions and felt comfortable with the plan of care.   Final Clinical Impressions(s) / ED Diagnoses   Final diagnoses:  Gross hematuria  Lower urinary tract infectious disease  Acute cystitis with hematuria    New Prescriptions New Prescriptions   CEPHALEXIN (KEFLEX) 500 MG CAPSULE    Take 1 capsule (500 mg total) by mouth 2 (two) times daily.     Forde Dandy, MD 03/13/17 [phone]

## 2017-03-13 NOTE — ED Notes (Signed)
Pt. Assisted to bedside commode. Pt. Educated on clean catch technique.

## 2017-03-13 NOTE — ED Triage Notes (Signed)
Pt from home with c/o hematuria starting this morning.  Pt denies any other urinary symptoms.  Reports she was d/c yesterday from the hospital for DVT and took her first blood thinner pill yesterday.  Has not taken any today.  NAD, A&O.

## 2017-03-15 ENCOUNTER — Ambulatory Visit (INDEPENDENT_AMBULATORY_CARE_PROVIDER_SITE_OTHER): Payer: PPO | Admitting: Podiatry

## 2017-03-15 ENCOUNTER — Encounter: Payer: Self-pay | Admitting: Podiatry

## 2017-03-15 DIAGNOSIS — I824Z2 Acute embolism and thrombosis of unspecified deep veins of left distal lower extremity: Secondary | ICD-10-CM | POA: Diagnosis not present

## 2017-03-15 LAB — URINE CULTURE: Culture: >=100000 — AB

## 2017-03-15 NOTE — Progress Notes (Signed)
fer

## 2017-03-16 ENCOUNTER — Telehealth: Payer: Self-pay | Admitting: *Deleted

## 2017-03-16 NOTE — Telephone Encounter (Signed)
Post ED Visit - Positive Culture Follow-up  Culture report reviewed by antimicrobial stewardship pharmacist:  []  Elenor Quinones, Pharm.D. []  Heide Guile, Pharm.D., BCPS AQ-ID [x]  Parks Neptune, Pharm.D., BCPS []  Alycia Rossetti, Pharm.D., BCPS []  [place], Pharm.D., BCPS, AAHIVP []  Legrand Como, Pharm.D., BCPS, AAHIVP []  Salome Arnt, PharmD, BCPS []  Dimitri Ped, PharmD, BCPS []  Vincenza Hews, PharmD, BCPS  Positive urine culture Treated with Cephalexin, organism sensitive to the same and no further patient follow-up is required at this time.  Harlon Flor [facility] 03/16/2017, 10:59 AM

## 2017-03-17 NOTE — Progress Notes (Signed)
   HPI: Patient is a 71 year old female presenting today with a complaint of left lateral ankle pain and pain to the plantar aspect of the left heel. Patient states she was recently admitted for a popliteal DVT and UTI. She is currently taking Eliquis. Resting without ambulation helps to alleviate the pain. She reports the cam boot she was given makes her foot hurt worse. She is here for further evaluation and treatment.   Physical Exam: General: The patient is alert and oriented x3 in no acute distress.  Dermatology: Skin is warm, dry and supple bilateral lower extremities. Negative for open lesions or macerations.  Vascular: Palpable pedal pulses bilaterally. No edema or erythema noted. Capillary refill within normal limits.  Neurological: Epicritic and protective threshold grossly intact bilaterally.   Musculoskeletal Exam: Range of motion within normal limits to all pedal and ankle joints bilateral. Muscle strength 5/5 in all groups bilateral.    Assessment: 1. Acute DVT left lower extremity   Plan of Care:  1. Patient was evaluated. 2. Recommended follow-up with vein and vascular specialist. 3. Appointment with Liliane Channel for gauntlet type AFO. 4. Patient cannot be nonweightbearing and cannot tolerate cam boot. 5. Continue cam boot until AFO is dispensed. 6. Return to clinic in 4 weeks.   Edrick Kins, DPM [facility]  Dr. Edrick Kins, [address]                                        [place], [place] [address]                Office [phone]  Fax [phone]

## 2017-03-20 DIAGNOSIS — N184 Chronic kidney disease, stage 4 (severe): Secondary | ICD-10-CM | POA: Diagnosis not present

## 2017-03-20 DIAGNOSIS — I82402 Acute embolism and thrombosis of unspecified deep veins of left lower extremity: Secondary | ICD-10-CM | POA: Diagnosis not present

## 2017-03-20 DIAGNOSIS — Z6841 Body Mass Index (BMI) 40.0 and over, adult: Secondary | ICD-10-CM | POA: Diagnosis not present

## 2017-03-20 DIAGNOSIS — E7800 Pure hypercholesterolemia, unspecified: Secondary | ICD-10-CM | POA: Diagnosis not present

## 2017-03-20 DIAGNOSIS — N189 Chronic kidney disease, unspecified: Secondary | ICD-10-CM | POA: Diagnosis not present

## 2017-03-20 DIAGNOSIS — M109 Gout, unspecified: Secondary | ICD-10-CM | POA: Diagnosis not present

## 2017-03-20 DIAGNOSIS — E1165 Type 2 diabetes mellitus with hyperglycemia: Secondary | ICD-10-CM | POA: Diagnosis not present

## 2017-03-21 ENCOUNTER — Ambulatory Visit: Payer: PPO | Admitting: Orthotics

## 2017-03-27 DIAGNOSIS — E1165 Type 2 diabetes mellitus with hyperglycemia: Secondary | ICD-10-CM | POA: Diagnosis not present

## 2017-03-27 DIAGNOSIS — N189 Chronic kidney disease, unspecified: Secondary | ICD-10-CM | POA: Diagnosis not present

## 2017-03-27 DIAGNOSIS — E7800 Pure hypercholesterolemia, unspecified: Secondary | ICD-10-CM | POA: Diagnosis not present

## 2017-03-27 DIAGNOSIS — I10 Essential (primary) hypertension: Secondary | ICD-10-CM | POA: Diagnosis not present

## 2017-03-30 ENCOUNTER — Emergency Department (HOSPITAL_COMMUNITY): Payer: PPO

## 2017-03-30 ENCOUNTER — Inpatient Hospital Stay (HOSPITAL_COMMUNITY)
Admission: EM | Admit: 2017-03-30 | Discharge: 2017-04-07 | DRG: 871 | Disposition: A | Discharge status: Skilled Nursing Facility | Payer: PPO | Attending: Internal Medicine | Admitting: Internal Medicine

## 2017-03-30 ENCOUNTER — Encounter (HOSPITAL_COMMUNITY): Payer: Self-pay | Admitting: *Deleted

## 2017-03-30 DIAGNOSIS — J9602 Acute respiratory failure with hypercapnia: Secondary | ICD-10-CM | POA: Diagnosis present

## 2017-03-30 DIAGNOSIS — R109 Unspecified abdominal pain: Secondary | ICD-10-CM | POA: Diagnosis not present

## 2017-03-30 DIAGNOSIS — Z96659 Presence of unspecified artificial knee joint: Secondary | ICD-10-CM | POA: Diagnosis present

## 2017-03-30 DIAGNOSIS — M25512 Pain in left shoulder: Secondary | ICD-10-CM | POA: Diagnosis not present

## 2017-03-30 DIAGNOSIS — R6520 Severe sepsis without septic shock: Secondary | ICD-10-CM | POA: Diagnosis present

## 2017-03-30 DIAGNOSIS — G9341 Metabolic encephalopathy: Secondary | ICD-10-CM | POA: Diagnosis present

## 2017-03-30 DIAGNOSIS — N289 Disorder of kidney and ureter, unspecified: Secondary | ICD-10-CM | POA: Diagnosis present

## 2017-03-30 DIAGNOSIS — G4733 Obstructive sleep apnea (adult) (pediatric): Secondary | ICD-10-CM | POA: Diagnosis present

## 2017-03-30 DIAGNOSIS — Z23 Encounter for immunization: Secondary | ICD-10-CM

## 2017-03-30 DIAGNOSIS — R0902 Hypoxemia: Secondary | ICD-10-CM

## 2017-03-30 DIAGNOSIS — E1122 Type 2 diabetes mellitus with diabetic chronic kidney disease: Secondary | ICD-10-CM | POA: Diagnosis present

## 2017-03-30 DIAGNOSIS — I130 Hypertensive heart and chronic kidney disease with heart failure and stage 1 through stage 4 chronic kidney disease, or unspecified chronic kidney disease: Secondary | ICD-10-CM | POA: Diagnosis present

## 2017-03-30 DIAGNOSIS — M25552 Pain in left hip: Secondary | ICD-10-CM | POA: Diagnosis not present

## 2017-03-30 DIAGNOSIS — D649 Anemia, unspecified: Secondary | ICD-10-CM | POA: Diagnosis present

## 2017-03-30 DIAGNOSIS — R0602 Shortness of breath: Secondary | ICD-10-CM | POA: Diagnosis not present

## 2017-03-30 DIAGNOSIS — J9601 Acute respiratory failure with hypoxia: Secondary | ICD-10-CM | POA: Diagnosis present

## 2017-03-30 DIAGNOSIS — R0682 Tachypnea, not elsewhere classified: Secondary | ICD-10-CM

## 2017-03-30 DIAGNOSIS — S2242XD Multiple fractures of ribs, left side, subsequent encounter for fracture with routine healing: Secondary | ICD-10-CM | POA: Diagnosis not present

## 2017-03-30 DIAGNOSIS — R092 Respiratory arrest: Secondary | ICD-10-CM | POA: Diagnosis not present

## 2017-03-30 DIAGNOSIS — E872 Acidosis: Secondary | ICD-10-CM | POA: Diagnosis present

## 2017-03-30 DIAGNOSIS — A4151 Sepsis due to Escherichia coli [E. coli]: Secondary | ICD-10-CM | POA: Diagnosis not present

## 2017-03-30 DIAGNOSIS — A419 Sepsis, unspecified organism: Secondary | ICD-10-CM | POA: Diagnosis not present

## 2017-03-30 DIAGNOSIS — N183 Chronic kidney disease, stage 3 (moderate): Secondary | ICD-10-CM | POA: Diagnosis present

## 2017-03-30 DIAGNOSIS — Z7901 Long term (current) use of anticoagulants: Secondary | ICD-10-CM | POA: Diagnosis not present

## 2017-03-30 DIAGNOSIS — G2581 Restless legs syndrome: Secondary | ICD-10-CM | POA: Diagnosis present

## 2017-03-30 DIAGNOSIS — I509 Heart failure, unspecified: Secondary | ICD-10-CM | POA: Diagnosis not present

## 2017-03-30 DIAGNOSIS — W19XXXA Unspecified fall, initial encounter: Secondary | ICD-10-CM | POA: Diagnosis not present

## 2017-03-30 DIAGNOSIS — R1084 Generalized abdominal pain: Secondary | ICD-10-CM | POA: Diagnosis not present

## 2017-03-30 DIAGNOSIS — K219 Gastro-esophageal reflux disease without esophagitis: Secondary | ICD-10-CM | POA: Diagnosis present

## 2017-03-30 DIAGNOSIS — Z9889 Other specified postprocedural states: Secondary | ICD-10-CM

## 2017-03-30 DIAGNOSIS — I5033 Acute on chronic diastolic (congestive) heart failure: Secondary | ICD-10-CM | POA: Diagnosis not present

## 2017-03-30 DIAGNOSIS — N12 Tubulo-interstitial nephritis, not specified as acute or chronic: Secondary | ICD-10-CM | POA: Diagnosis not present

## 2017-03-30 DIAGNOSIS — L309 Dermatitis, unspecified: Secondary | ICD-10-CM | POA: Diagnosis present

## 2017-03-30 DIAGNOSIS — S2242XA Multiple fractures of ribs, left side, initial encounter for closed fracture: Secondary | ICD-10-CM | POA: Diagnosis present

## 2017-03-30 DIAGNOSIS — M109 Gout, unspecified: Secondary | ICD-10-CM | POA: Diagnosis present

## 2017-03-30 DIAGNOSIS — Z87891 Personal history of nicotine dependence: Secondary | ICD-10-CM | POA: Diagnosis not present

## 2017-03-30 DIAGNOSIS — M79642 Pain in left hand: Secondary | ICD-10-CM | POA: Diagnosis not present

## 2017-03-30 DIAGNOSIS — R7881 Bacteremia: Secondary | ICD-10-CM | POA: Diagnosis not present

## 2017-03-30 DIAGNOSIS — K5900 Constipation, unspecified: Secondary | ICD-10-CM | POA: Diagnosis not present

## 2017-03-30 DIAGNOSIS — I10 Essential (primary) hypertension: Secondary | ICD-10-CM | POA: Diagnosis not present

## 2017-03-30 DIAGNOSIS — R0789 Other chest pain: Secondary | ICD-10-CM | POA: Diagnosis not present

## 2017-03-30 DIAGNOSIS — I82402 Acute embolism and thrombosis of unspecified deep veins of left lower extremity: Secondary | ICD-10-CM | POA: Diagnosis present

## 2017-03-30 DIAGNOSIS — R4182 Altered mental status, unspecified: Secondary | ICD-10-CM

## 2017-03-30 DIAGNOSIS — Z794 Long term (current) use of insulin: Secondary | ICD-10-CM | POA: Diagnosis not present

## 2017-03-30 DIAGNOSIS — W109XXA Fall (on) (from) unspecified stairs and steps, initial encounter: Secondary | ICD-10-CM | POA: Diagnosis present

## 2017-03-30 DIAGNOSIS — J90 Pleural effusion, not elsewhere classified: Secondary | ICD-10-CM

## 2017-03-30 DIAGNOSIS — N179 Acute kidney failure, unspecified: Secondary | ICD-10-CM | POA: Diagnosis not present

## 2017-03-30 DIAGNOSIS — R0603 Acute respiratory distress: Secondary | ICD-10-CM

## 2017-03-30 DIAGNOSIS — M79602 Pain in left arm: Secondary | ICD-10-CM | POA: Diagnosis not present

## 2017-03-30 DIAGNOSIS — M62838 Other muscle spasm: Secondary | ICD-10-CM | POA: Diagnosis not present

## 2017-03-30 DIAGNOSIS — E785 Hyperlipidemia, unspecified: Secondary | ICD-10-CM | POA: Diagnosis present

## 2017-03-30 DIAGNOSIS — R52 Pain, unspecified: Secondary | ICD-10-CM | POA: Diagnosis not present

## 2017-03-30 DIAGNOSIS — R140 Abdominal distension (gaseous): Secondary | ICD-10-CM | POA: Diagnosis not present

## 2017-03-30 DIAGNOSIS — Z9181 History of falling: Secondary | ICD-10-CM | POA: Diagnosis not present

## 2017-03-30 DIAGNOSIS — R918 Other nonspecific abnormal finding of lung field: Secondary | ICD-10-CM | POA: Diagnosis not present

## 2017-03-30 DIAGNOSIS — N390 Urinary tract infection, site not specified: Secondary | ICD-10-CM | POA: Diagnosis not present

## 2017-03-30 DIAGNOSIS — M6281 Muscle weakness (generalized): Secondary | ICD-10-CM | POA: Diagnosis not present

## 2017-03-30 DIAGNOSIS — T148XXA Other injury of unspecified body region, initial encounter: Secondary | ICD-10-CM | POA: Diagnosis not present

## 2017-03-30 DIAGNOSIS — J948 Other specified pleural conditions: Secondary | ICD-10-CM | POA: Diagnosis not present

## 2017-03-30 DIAGNOSIS — E119 Type 2 diabetes mellitus without complications: Secondary | ICD-10-CM

## 2017-03-30 DIAGNOSIS — R0781 Pleurodynia: Secondary | ICD-10-CM | POA: Diagnosis not present

## 2017-03-30 DIAGNOSIS — S299XXA Unspecified injury of thorax, initial encounter: Secondary | ICD-10-CM | POA: Diagnosis not present

## 2017-03-30 DIAGNOSIS — I82409 Acute embolism and thrombosis of unspecified deep veins of unspecified lower extremity: Secondary | ICD-10-CM | POA: Diagnosis not present

## 2017-03-30 LAB — COMPREHENSIVE METABOLIC PANEL
ALT: 23 U/L (ref 14–54)
AST: 24 U/L (ref 15–41)
Albumin: 2.9 g/dL — ABNORMAL LOW (ref 3.5–5.0)
Alkaline Phosphatase: 97 U/L (ref 38–126)
Anion gap: 10 (ref 5–15)
BUN: 31 mg/dL — ABNORMAL HIGH (ref 6–20)
CO2: 23 mmol/L (ref 22–32)
Calcium: 8.1 mg/dL — ABNORMAL LOW (ref 8.9–10.3)
Chloride: 101 mmol/L (ref 101–111)
Creatinine, Ser: 2.31 mg/dL — ABNORMAL HIGH (ref 0.44–1.00)
GFR calc Af Amer: 23 mL/min — ABNORMAL LOW (ref >60)
GFR calc non Af Amer: 20 mL/min — ABNORMAL LOW (ref >60)
Glucose, Bld: 282 mg/dL — ABNORMAL HIGH (ref 65–99)
Potassium: 4.8 mmol/L (ref 3.5–5.1)
Sodium: 134 mmol/L — ABNORMAL LOW (ref 135–145)
Total Bilirubin: 0.7 mg/dL (ref 0.3–1.2)
Total Protein: 6.2 g/dL — ABNORMAL LOW (ref 6.5–8.1)

## 2017-03-30 LAB — CBG MONITORING, ED: Glucose-Capillary: 269 mg/dL — ABNORMAL HIGH (ref 65–99)

## 2017-03-30 LAB — URINALYSIS, ROUTINE W REFLEX MICROSCOPIC
Bilirubin Urine: NEGATIVE
Glucose, UA: NEGATIVE mg/dL
Ketones, ur: NEGATIVE mg/dL
Nitrite: POSITIVE — AB
Protein, ur: >300 mg/dL — AB
Specific Gravity, Urine: >1.03 — ABNORMAL HIGH (ref 1.005–1.030)
pH: 6 (ref 5.0–8.0)

## 2017-03-30 LAB — CBC WITH DIFFERENTIAL/PLATELET
Band Neutrophils: 0 %
Basophils Absolute: 0 10*3/uL (ref 0.0–0.1)
Basophils Relative: 0 %
Blasts: 0 %
Eosinophils Absolute: 0 10*3/uL (ref 0.0–0.7)
Eosinophils Relative: 0 %
HCT: 35.5 % — ABNORMAL LOW (ref 36.0–46.0)
Hemoglobin: 11 g/dL — ABNORMAL LOW (ref 12.0–15.0)
Lymphocytes Relative: 7 %
Lymphs Abs: 1 10*3/uL (ref 0.7–4.0)
MCH: 29.5 pg (ref 26.0–34.0)
MCHC: 31 g/dL (ref 30.0–36.0)
MCV: 95.2 fL (ref 78.0–100.0)
Metamyelocytes Relative: 0 %
Monocytes Absolute: 1.1 10*3/uL — ABNORMAL HIGH (ref 0.1–1.0)
Monocytes Relative: 8 %
Myelocytes: 0 %
Neutro Abs: 12 10*3/uL — ABNORMAL HIGH (ref 1.7–7.7)
Neutrophils Relative %: 85 %
Other: 0 %
Platelets: 227 10*3/uL (ref 150–400)
Promyelocytes Absolute: 0 %
RBC: 3.73 MIL/uL — ABNORMAL LOW (ref 3.87–5.11)
RDW: 15.9 % — ABNORMAL HIGH (ref 11.5–15.5)
WBC: 14.1 10*3/uL — ABNORMAL HIGH (ref 4.0–10.5)
nRBC: 0 /100 WBC

## 2017-03-30 LAB — PROTIME-INR
INR: 1.65
Prothrombin Time: 19.7 seconds — ABNORMAL HIGH (ref 11.4–15.2)

## 2017-03-30 LAB — SODIUM, URINE, RANDOM: Sodium, Ur: 23 mmol/L

## 2017-03-30 LAB — I-STAT CG4 LACTIC ACID, ED
Lactic Acid, Venous: 0.91 mmol/L (ref 0.5–1.9)
Lactic Acid, Venous: 0.72 mmol/L (ref 0.5–1.9)

## 2017-03-30 LAB — URINALYSIS, MICROSCOPIC (REFLEX)

## 2017-03-30 LAB — CREATININE, URINE, RANDOM: Creatinine, Urine: 105.31 mg/dL

## 2017-03-30 LAB — PHOSPHORUS: Phosphorus: 2.9 mg/dL (ref 2.5–4.6)

## 2017-03-30 LAB — MAGNESIUM: Magnesium: 1.8 mg/dL (ref 1.7–2.4)

## 2017-03-30 MED ORDER — ACETAMINOPHEN 500 MG PO TABS
1000.0000 mg | ORAL_TABLET | Freq: Once | ORAL | Status: AC
  Administered 2017-03-30: 1000 mg via ORAL
  Filled 2017-03-30: qty 2

## 2017-03-30 MED ORDER — ROPINIROLE HCL 1 MG PO TABS
4.0000 mg | ORAL_TABLET | Freq: Two times a day (BID) | ORAL | Status: DC
  Administered 2017-03-30 – 2017-04-07 (×14): 4 mg via ORAL
  Filled 2017-03-30 (×16): qty 4

## 2017-03-30 MED ORDER — PIPERACILLIN-TAZOBACTAM 3.375 G IVPB 30 MIN
3.3750 g | Freq: Once | INTRAVENOUS | Status: AC
Start: 2017-03-30 — End: 2017-03-30
  Administered 2017-03-30: 3.375 g via INTRAVENOUS
  Filled 2017-03-30 (×2): qty 50

## 2017-03-30 MED ORDER — ALBUTEROL SULFATE (2.5 MG/3ML) 0.083% IN NEBU
2.5000 mg | INHALATION_SOLUTION | RESPIRATORY_TRACT | Status: DC | PRN
Start: 2017-03-30 — End: 2017-04-07
  Administered 2017-04-01: 2.5 mg via RESPIRATORY_TRACT
  Filled 2017-03-30: qty 3

## 2017-03-30 MED ORDER — SODIUM CHLORIDE 0.9 % IV SOLN
INTRAVENOUS | Status: DC
  Administered 2017-03-30 (×2): via INTRAVENOUS

## 2017-03-30 MED ORDER — APIXABAN 5 MG PO TABS
5.0000 mg | ORAL_TABLET | Freq: Two times a day (BID) | ORAL | Status: DC
  Administered 2017-03-30 – 2017-04-07 (×14): 5 mg via ORAL
  Filled 2017-03-30 (×15): qty 1

## 2017-03-30 MED ORDER — INSULIN ASPART 100 UNIT/ML ~~LOC~~ SOLN: 30.0000 [IU] | Freq: Three times a day (TID) | SUBCUTANEOUS | Status: DC

## 2017-03-30 MED ORDER — INSULIN NPH (HUMAN) (ISOPHANE) 100 UNIT/ML ~~LOC~~ SUSP: 75.0000 [IU] | Freq: Every day | SUBCUTANEOUS | Status: DC

## 2017-03-30 MED ORDER — MORPHINE SULFATE (PF) 2 MG/ML IV SOLN: 1.0000 mg | INTRAVENOUS | Status: DC | PRN

## 2017-03-30 MED ORDER — VANCOMYCIN HCL 10 G IV SOLR
2000.0000 mg | Freq: Once | INTRAVENOUS | Status: AC
  Administered 2017-03-30: 2000 mg via INTRAVENOUS
  Filled 2017-03-30: qty 2000

## 2017-03-30 MED ORDER — APIXABAN 5 MG PO TABS
5.0000 mg | ORAL_TABLET | Freq: Two times a day (BID) | ORAL | Status: DC
  Filled 2017-03-30: qty 1

## 2017-03-30 MED ORDER — INSULIN ASPART 100 UNIT/ML ~~LOC~~ SOLN
0.0000 [IU] | Freq: Three times a day (TID) | SUBCUTANEOUS | Status: DC
  Administered 2017-03-30: 11 [IU] via SUBCUTANEOUS
  Filled 2017-03-30: qty 1

## 2017-03-30 MED ORDER — ONDANSETRON HCL 4 MG PO TABS
4.0000 mg | ORAL_TABLET | Freq: Four times a day (QID) | ORAL | Status: DC | PRN
  Administered 2017-04-03: 4 mg via ORAL
  Filled 2017-03-30: qty 1

## 2017-03-30 MED ORDER — MORPHINE SULFATE (PF) 2 MG/ML IV SOLN
1.0000 mg | INTRAVENOUS | Status: DC | PRN
  Administered 2017-03-31: 2 mg via INTRAVENOUS
  Filled 2017-03-30: qty 1

## 2017-03-30 MED ORDER — COLCHICINE 0.6 MG PO TABS
0.6000 mg | ORAL_TABLET | Freq: Every day | ORAL | Status: DC
  Administered 2017-03-30 – 2017-04-07 (×8): 0.6 mg via ORAL
  Filled 2017-03-30 (×9): qty 1

## 2017-03-30 MED ORDER — PANTOPRAZOLE SODIUM 40 MG PO TBEC
40.0000 mg | DELAYED_RELEASE_TABLET | Freq: Every day | ORAL | Status: DC
  Administered 2017-04-01 – 2017-04-07 (×7): 40 mg via ORAL
  Filled 2017-03-30 (×8): qty 1

## 2017-03-30 MED ORDER — PRAVASTATIN SODIUM 40 MG PO TABS
40.0000 mg | ORAL_TABLET | Freq: Every day | ORAL | Status: DC
  Administered 2017-04-01 – 2017-04-06 (×6): 40 mg via ORAL
  Filled 2017-03-30 (×8): qty 1

## 2017-03-30 MED ORDER — PIPERACILLIN-TAZOBACTAM 3.375 G IVPB
3.3750 g | Freq: Three times a day (TID) | INTRAVENOUS | Status: DC
  Administered 2017-03-30 – 2017-04-02 (×8): 3.375 g via INTRAVENOUS
  Filled 2017-03-30 (×10): qty 50

## 2017-03-30 MED ORDER — MORPHINE SULFATE (PF) 2 MG/ML IV SOLN
INTRAVENOUS | Status: AC
  Administered 2017-03-30: 2 mg via INTRAVENOUS
  Filled 2017-03-30: qty 1

## 2017-03-30 MED ORDER — INSULIN NPH (HUMAN) (ISOPHANE) 100 UNIT/ML ~~LOC~~ SUSP: 15.0000 [IU] | SUBCUTANEOUS | Status: DC

## 2017-03-30 MED ORDER — ACETAMINOPHEN 325 MG PO TABS
650.0000 mg | ORAL_TABLET | Freq: Four times a day (QID) | ORAL | Status: DC | PRN
  Administered 2017-04-03: 650 mg via ORAL
  Filled 2017-03-30: qty 2

## 2017-03-30 MED ORDER — VANCOMYCIN HCL IN DEXTROSE 1-5 GM/200ML-% IV SOLN: 1000.0000 mg | Freq: Once | INTRAVENOUS | Status: DC

## 2017-03-30 MED ORDER — INSULIN NPH (HUMAN) (ISOPHANE) 100 UNIT/ML ~~LOC~~ SUSP
65.0000 [IU] | Freq: Every day | SUBCUTANEOUS | Status: DC
  Filled 2017-03-30: qty 10

## 2017-03-30 MED ORDER — APIXABAN 5 MG PO TABS: 10.0000 mg | ORAL_TABLET | Freq: Two times a day (BID) | ORAL | Status: DC

## 2017-03-30 MED ORDER — ONDANSETRON HCL 4 MG/2ML IJ SOLN: 4.0000 mg | Freq: Four times a day (QID) | INTRAMUSCULAR | Status: DC | PRN

## 2017-03-30 MED ORDER — INSULIN NPH (HUMAN) (ISOPHANE) 100 UNIT/ML ~~LOC~~ SUSP: 15.0000 [IU] | Freq: Every day | SUBCUTANEOUS | Status: DC

## 2017-03-30 MED ORDER — VANCOMYCIN HCL 10 G IV SOLR: 1500.0000 mg | INTRAVENOUS | Status: DC

## 2017-03-30 MED ORDER — MORPHINE SULFATE (PF) 2 MG/ML IV SOLN
1.0000 mg | INTRAVENOUS | Status: DC | PRN
  Administered 2017-03-30: 2 mg via INTRAVENOUS
  Administered 2017-03-30: 1 mg via INTRAVENOUS
  Filled 2017-03-30: qty 1

## 2017-03-30 MED ORDER — HYDROCODONE-ACETAMINOPHEN 5-325 MG PO TABS
1.0000 | ORAL_TABLET | ORAL | Status: DC | PRN
  Administered 2017-03-30: 2 via ORAL
  Filled 2017-03-30: qty 2

## 2017-03-30 MED ORDER — HYDRALAZINE HCL 20 MG/ML IJ SOLN
10.0000 mg | Freq: Three times a day (TID) | INTRAMUSCULAR | Status: DC | PRN
  Administered 2017-04-04: 10 mg via INTRAVENOUS
  Filled 2017-03-30: qty 1

## 2017-03-30 MED ORDER — ACETAMINOPHEN 650 MG RE SUPP: 650.0000 mg | Freq: Four times a day (QID) | RECTAL | Status: DC | PRN

## 2017-03-30 NOTE — H&P (Signed)
[facility] History and Physical  Jean Mcclain IRS:[id number] DOB: [birth year] DOA: 03/30/2017  Referring physician:  PCP: Leanna Battles, MD   Chief Complaint: fall  HPI: Jean Mcclain is a 71 y.o. female  with past medical history of diabetes, reflux, sleep apnea, osteopenia presented to the emergency room after fall downstairs. Patient states she fell and has pain on the left side. Came to emergency room for evaluation due to concern for possible injury. Has some CP post fall. Denies head injury with fall.  ED course: Found in the emergency room to be febrile. Started on sepsis protocol. Hospitalist consulted for admission.   Review of Systems:  As per HPI otherwise 10 point review of systems negative.    Past Medical History:  Diagnosis Date  . Arthritis   . Diabetes mellitus, type 2 ([place])   . 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 GERD (gastroesophageal reflux disease)   . Hyperlipidemia   . Hypertension   . OSA (obstructive sleep apnea)    does not use CPAP   . Osteopenia   . Shortness of breath    with exertion  . Vitamin D deficiency    Past Surgical History:  Procedure Laterality Date  . INSERTION OF MESH N/A 01/08/2013   Procedure: INSERTION OF MESH;  Surgeon: Earnstine Regal, MD;  Location: [facility];  Service: General;  Laterality: N/A;  . JOINT REPLACEMENT     bilateral knee replacements- 1999  . TOTAL KNEE ARTHROPLASTY  1999   Left  . VENTRAL HERNIA REPAIR N/A 01/08/2013   Procedure: LAPAROSCOPIC VENTRAL HERNIA;  Surgeon: Earnstine Regal, MD;  Location: [facility];  Service: General;  Laterality: N/A;   Social History:  reports that she quit smoking about 35 years ago. Her smoking use included Cigarettes. She quit after 27.00 years of use. She has never used smokeless tobacco. She reports that she does not drink alcohol or use drugs.  No Known Allergies  Family History  Problem Relation Age of Onset  . Leukemia Mother   . Hypertension Brother   . Emphysema  Father      Prior to Admission medications   Medication Sig Start Date End Date Taking? Authorizing Provider  allopurinol (ZYLOPRIM) 100 MG tablet Take 100 mg by mouth daily. 02/24/17   [provider]  apixaban (ELIQUIS) 5 MG TABS tablet Take 2 tablets (10 mg total) by mouth 2 (two) times daily. Then on 03/17/17, take 1 tabet (5 mg) two times daily. 03/12/17   Mariel Aloe, MD  colchicine 0.6 MG tablet Take 0.6 mg by mouth daily. 02/21/17   [provider]  cyclobenzaprine (FLEXERIL) 10 MG tablet Take 10 mg by mouth at bedtime.    [provider]  ergocalciferol (VITAMIN D2) 50000 UNITS capsule Take 50,000 Units by mouth once a week. Patient takes on Fridays    [provider]  febuxostat (ULORIC) 40 MG tablet Take 40 mg by mouth daily.    [provider]  gabapentin (NEURONTIN) 300 MG capsule Take 2 capsules (600 mg total) by mouth 2 (two) times daily. 03/12/17   Mariel Aloe, MD  HYDROcodone-acetaminophen [facility]) 7.5-325 MG tablet  03/02/17   [provider]  insulin NPH Human (HUMULIN N,NOVOLIN N) 100 UNIT/ML injection Inject 15-75 Units into the skin See admin instructions. Use 65 units every morning then use 15 units at lunch then use 75 units at dinner    [provider]  insulin regular (NOVOLIN R,HUMULIN  R) 100 units/mL injection Inject 30 Units into the skin 3 (three) times daily before meals.    [provider]  lisinopril (PRINIVIL,ZESTRIL) 5 MG tablet  03/07/17   [provider]  lovastatin (MEVACOR) 40 MG tablet Take 40 mg by mouth at bedtime.     [provider]  omeprazole (PRILOSEC) 20 MG capsule Take 20 mg by mouth daily.    [provider]  rOPINIRole (REQUIP) 4 MG tablet Take 4 mg by mouth 2 (two) times daily.     [provider]  Triamcinolone Acetonide (TRIAMCINOLONE 0.1 % CREAM : EUCERIN) CREA Apply 1 application topically 3 (three) times daily as needed for itching. Use  for two weeks 03/12/17   Mariel Aloe, MD   Physical Exam: Vitals:   03/30/17 1109 03/30/17 1112 03/30/17 1329 03/30/17 1515  BP:  (!) 150/60 138/61 (!) 137/52  Pulse:  (!) 114 97 93  Resp:  (!) 24 (!) 24 (!) 21  Temp:  (!) 104.4 F (40.2 C) 99.9 F (37.7 C)   TempSrc:  Oral Oral   SpO2: 94% (!) 83% 93% 92%  Weight:  113.4 kg (250 lb)    Height:  5' (1.524 m)      Wt Readings from Last 3 Encounters:  03/30/17 113.4 kg (250 lb)  03/13/17 113.4 kg (250 lb)  03/12/17 112.9 kg (248 lb 12.8 oz)    General:  Appears calm and comfortable; A&Ox3 Eyes:  PERRL, EOMI, normal lids, iris ENT:  grossly normal hearing, lips & tongue Neck:  no LAD, masses or thyromegaly Cardiovascular:  RRR, no m/r/g. No LE edema.  Respiratory:  CTA bilaterally, no w/r/r. Normal respiratory effort. Chets wlal tenderness. Abdomen:  soft, ntnd Skin:  no rash or induration seen on limited exam Musculoskeletal:  grossly normal tone BUE/BLE, L hand tenderness, L hip tender, L chest tender Psychiatric:  grossly normal mood and affect, speech fluent and appropriate Neurologic:  CN 2-12 grossly intact, moves all extremities in coordinated fashion.          Labs on Admission:  Basic Metabolic Panel:  Recent Labs Lab 03/30/17 1128  NA 134*  K 4.8  CL 101  CO2 23  GLUCOSE 282*  BUN 31*  CREATININE 2.31*  CALCIUM 8.1*   Liver Function Tests:  Recent Labs Lab 03/30/17 1128  AST 24  ALT 23  ALKPHOS 97  BILITOT 0.7  PROT 6.2*  ALBUMIN 2.9*   No results for input(s): LIPASE, AMYLASE in the last 168 hours. No results for input(s): AMMONIA in the last 168 hours. CBC:  Recent Labs Lab 03/30/17 1128  WBC 14.1*  NEUTROABS 12.0*  HGB 11.0*  HCT 35.5*  MCV 95.2  PLT 227   Cardiac Enzymes: No results for input(s): CKTOTAL, CKMB, CKMBINDEX, TROPONINI in the last 168 hours.  BNP (last 3 results) No results for input(s): BNP in the last 8760 hours.  ProBNP (last 3 results) No results for  input(s): PROBNP in the last 8760 hours.   Serum creatinine: 2.31 mg/dL (H) 03/30/17 1128 Estimated creatinine clearance: 25.6 mL/min (A)  CBG: No results for input(s): GLUCAP in the last 168 hours.  Radiological Exams on Admission: Dg Chest 2 View  Result Date: 03/30/2017 CLINICAL DATA:  Shortness of breath since falling yesterday, LEFT arm pain, hypertension, diabetes mellitus, COPD, former smoker EXAM: CHEST  2 VIEW COMPARISON:  03/2017 FINDINGS: Enlargement of cardiac silhouette. Mediastinal contours and pulmonary vascularity normal. RIGHT basilar atelectasis. Underpenetration of LEFT lung  base. Remaining lungs clear. Rotated to the LEFT. Central peribronchial thickening. No gross pleural effusion or pneumothorax. Bones demineralized. IMPRESSION: Enlargement of cardiac silhouette. Bronchitic changes with RIGHT basilar atelectasis. Electronically Signed   By: Lavonia Dana M.D.   On: 03/30/2017 13:01    EKG: Independently reviewed. Sinus tachy  Assessment/Plan Principal Problem:   Sepsis ([place]) Active Problems:   Insulin-requiring or dependent type II diabetes mellitus ([place])   Essential hypertension   AKI (acute kidney injury) ([place])   Sepsis 2/2 unk Patient hemodynamically stable Given vanc emergency room, will continue Given zosyn in the emergency room, will continue UA pending Blood cultures 2 pending Patient given 0 mL of fluid in the emergency room Lactic acid normal Lipase normal  DVT Cont eliquis  MSK  Pain after fall Ordered XR of tender areas  AKI Baseline Cr 1.7-*1.8, Cr on admit 2.31 0 of normal saline given in the emergency room Gentle hydration overnight Checking magnesium and phosphorus Urine labs ordered to calculate fractional excretion of NA Hold lisinopril  Gout Continue allopurinol, colchicine  Diabetes Continue NPH AC [place] R before meals sliding scale insulin  GERD Cont PPI  Hyperlipidemia Continue statin  RLS Cont requip  bid  0 ml = no bolus  Code Status: FULL  DVT Prophylaxis: eliquis Family Communication: dgtr at bedside Disposition Plan: Pending Improvement  Status: tele, obs  Elwin Mocha, MD Family Medicine [facility] [email or web address] Password [email or web address]

## 2017-03-30 NOTE — Progress Notes (Signed)
Pharmacy Antibiotic Note  Jean Mcclain is a 71 y.o. female admitted on 03/30/2017 with sepsis.  Pharmacy has been consulted for vancomycin and zosyn dosing. Temp 104.4 and WBC 14.1. SCr 2.31 (baseline 1.8-2) for normalized estimated CrCl ~ 25-30 mL/min.   Plan: Vancomycin 2g IV x1, then 1.5g IV q48hr  Zosyn 3.375g IV q8hr Vancomycin trough at SS and PRN (goal 15-20 mcg/mL) Monitor renal function, clinical picture, and cultures F/u length of therapy    Height: 5' (152.4 cm) Weight: 250 lb (113.4 kg) IBW/kg (Calculated) : 45.5  Temp (24hrs), Avg:104.4 F (40.2 C), Min:104.4 F (40.2 C), Max:104.4 F (40.2 C)   Recent Labs Lab 03/30/17 1128  WBC 14.1*    Estimated Creatinine Clearance: 31.7 mL/min (A) (by C-G formula based on SCr of 1.87 mg/dL (H)).    No Known Allergies  Antimicrobials this admission: 6/21 Vanc >>  6/21 Zosyn >>   Dose adjustments this admission: n/a   Microbiology results: Pending  Argie Ramming, PharmD Pharmacy Resident  Pager [phone] 03/30/17 12:04 PM

## 2017-03-30 NOTE — ED Provider Notes (Signed)
[facility] DEPT Provider Note   CSN: [id number] Arrival date & time: 03/30/17  1055     History   Chief Complaint Chief Complaint  Patient presents with  . Fall    HPI Jean Mcclain is a 71 y.o. female.  HPI Patient has complex medical history with recent DVT of the left lower extremity. Patient has been wearing a Cam Walker due to spontaneous fracture of the navicular bone over the past several months. She reports she takes Eliquis. Yesterday evening she was going down the stairs with her cane started lose her balance of the top of the stairs. She reports that she fell and since that time now has had pain in the left side of her chest. Patient was not aware that she was febrile upon coming into the emergency department. On review systems she notes that she continues to have urinary symptoms and doesn't think that the UTI was cleared. She had recently been treated with antibiotics. She also notes that 2 days ago she was having a lot of chills. She is not however specifically endorsing other systems are review systems. Past Medical History:  Diagnosis Date  . Arthritis   . Diabetes mellitus, type 2 ([place])   . Facet joint disease ([place])   . GERD (gastroesophageal reflux disease)   . Hyperlipidemia   . Hypertension   . OSA (obstructive sleep apnea)    does not use CPAP   . Osteopenia   . Shortness of breath    with exertion  . Vitamin D deficiency     Patient Active Problem List   Diagnosis Date Noted  . AKI (acute kidney injury) ([place]) 03/30/2017  . Sepsis ([place]) 03/30/2017  . Acute deep vein thrombosis (DVT) of popliteal vein of left lower extremity ([place]) 03/11/2017  . Renal insufficiency 03/11/2017  . Insulin-requiring or dependent type II diabetes mellitus ([place]) 03/11/2017  . Essential hypertension 03/11/2017  . Impingement syndrome of left ankle 01/31/2017  . Posterior tibial tendinitis, left leg 01/31/2017  . Incarcerated ventral hernia 01/02/2013    Past  Surgical History:  Procedure Laterality Date  . INSERTION OF MESH N/A 01/08/2013   Procedure: INSERTION OF MESH;  Surgeon: Earnstine Regal, MD;  Location: [facility];  Service: General;  Laterality: N/A;  . JOINT REPLACEMENT     bilateral knee replacements- 1999  . TOTAL KNEE ARTHROPLASTY  1999   Left  . VENTRAL HERNIA REPAIR N/A 01/08/2013   Procedure: LAPAROSCOPIC VENTRAL HERNIA;  Surgeon: Earnstine Regal, MD;  Location: [facility] OR[facility];  Service: General;  Laterality: N/A;    OB History    Gravida Para Term Preterm AB Living   0 0 0 0 0 0   SAB TAB Ectopic Multiple Live Births   0 0 0 0 0       Home Medications    Prior to Admission medications   Medication Sig Start Date End Date Taking? Authorizing Provider  allopurinol (ZYLOPRIM) 100 MG tablet Take 100 mg by mouth daily. 02/24/17   [provider]  apixaban (ELIQUIS) 5 MG TABS tablet Take 2 tablets (10 mg total) by mouth 2 (two) times daily. Then on 03/17/17, take 1 tabet (5 mg) two times daily. 03/12/17   Mariel Aloe, MD  colchicine 0.6 MG tablet Take 0.6 mg by mouth daily. 02/21/17   [provider]  cyclobenzaprine (FLEXERIL) 10 MG tablet Take 10 mg by mouth at bedtime.    [provider]  ergocalciferol (VITAMIN D2) 50000  UNITS capsule Take 50,000 Units by mouth once a week. Patient takes on Fridays    [provider]  febuxostat (ULORIC) 40 MG tablet Take 40 mg by mouth daily.    [provider]  gabapentin (NEURONTIN) 300 MG capsule Take 2 capsules (600 mg total) by mouth 2 (two) times daily. 03/12/17   Mariel Aloe, MD  HYDROcodone-acetaminophen [facility]) 7.5-325 MG tablet  03/02/17   [provider]  insulin NPH Human (HUMULIN N,NOVOLIN N) 100 UNIT/ML injection Inject 15-75 Units into the skin See admin instructions. Use 65 units every morning then use 15 units at lunch then use 75 units at dinner    [provider]  insulin regular (NOVOLIN R,HUMULIN R) 100 units/mL injection  Inject 30 Units into the skin 3 (three) times daily before meals.    [provider]  lisinopril (PRINIVIL,ZESTRIL) 5 MG tablet  03/07/17   [provider]  lovastatin (MEVACOR) 40 MG tablet Take 40 mg by mouth at bedtime.     [provider]  omeprazole (PRILOSEC) 20 MG capsule Take 20 mg by mouth daily.    [provider]  rOPINIRole (REQUIP) 4 MG tablet Take 4 mg by mouth 2 (two) times daily.     [provider]  Triamcinolone Acetonide (TRIAMCINOLONE 0.1 % CREAM : EUCERIN) CREA Apply 1 application topically 3 (three) times daily as needed for itching. Use for two weeks 03/12/17   Mariel Aloe, MD    Family History Family History  Problem Relation Age of Onset  . Leukemia Mother   . Hypertension Brother   . Emphysema Father     Social History Social History  Substance Use Topics  . Smoking status: Former Smoker    Years: 27.00    Types: Cigarettes    Quit date: 10/10/1981  . Smokeless tobacco: Never Used  . Alcohol use No     Allergies   Patient has no known allergies.   Review of Systems Review of Systems 10 Systems reviewed and are negative for acute change except as noted in the HPI.   Physical Exam Updated Vital Signs BP (!) 111/53   Pulse 87   Temp 99.9 F (37.7 C) (Oral)   Resp (!) 21   Ht 5' (1.524 m)   Wt 113.4 kg (250 lb)   SpO2 93%   BMI 48.82 kg/m   Physical Exam  Constitutional: She is oriented to person, place, and time.  Patient is alert. She has mild increased work of breathing at rest. Significant obesity.  HENT:  Head: Normocephalic and atraumatic.  Nose: Nose normal.  Mouth/Throat: Oropharynx is clear and moist.  Eyes: EOM are normal. Pupils are equal, round, and reactive to light.  Neck: Neck supple.  Cardiovascular:  Distant heart sounds regular. No gross rub murmur gallop.  Pulmonary/Chest:  Mild increased work of breathing. Patient has difficulty cooperating with exam due to body habitus  and pain in the left side of her chest. No gross wheezes or rhonchi.  Abdominal: Soft. There is no tenderness. There is no guarding.  Musculoskeletal:  Patient has an Ace wrap on the left lower extremity. There is some erythema above the wrap. 2+ edema.  Neurological: She is alert and oriented to person, place, and time. No cranial nerve deficit. She exhibits normal muscle tone. Coordination normal.  Skin: Skin is warm and dry.  Psychiatric: She has a normal mood and affect.     ED Treatments / Results  Labs (all  labs ordered are listed, but only abnormal results are displayed) Labs Reviewed  COMPREHENSIVE METABOLIC PANEL - Abnormal; Notable for the following:       Result Value   Sodium 134 (*)    Glucose, Bld 282 (*)    BUN 31 (*)    Creatinine, Ser 2.31 (*)    Calcium 8.1 (*)    Total Protein 6.2 (*)    Albumin 2.9 (*)    GFR calc non Af Amer 20 (*)    GFR calc Af Amer 23 (*)    All other components within normal limits  CBC WITH DIFFERENTIAL/PLATELET - Abnormal; Notable for the following:    WBC 14.1 (*)    RBC 3.73 (*)    Hemoglobin 11.0 (*)    HCT 35.5 (*)    RDW 15.9 (*)    Neutro Abs 12.0 (*)    Monocytes Absolute 1.1 (*)    All other components within normal limits  PROTIME-INR - Abnormal; Notable for the following:    Prothrombin Time 19.7 (*)    All other components within normal limits  CULTURE, BLOOD (ROUTINE X 2)  CULTURE, BLOOD (ROUTINE X 2)  URINALYSIS, ROUTINE W REFLEX MICROSCOPIC  MAGNESIUM  PHOSPHORUS  CREATININE, URINE, RANDOM  SODIUM, URINE, RANDOM  I-STAT CG4 LACTIC ACID, ED  I-STAT CG4 LACTIC ACID, ED  I-STAT CG4 LACTIC ACID, ED  I-STAT CG4 LACTIC ACID, ED    EKG  EKG Interpretation  Date/Time:  Thursday March 30 2017 11:03:46 EDT Ventricular Rate:  110 PR Interval:    QRS Duration: 74 QT Interval:  336 QTC Calculation: 455 R Axis:   -21 Text Interpretation:  Sinus tachycardia Low voltage, precordial leads Left ventricular hypertrophy  agree. no sig change from previous Confirmed by Charlesetta Shanks [phone]) on 03/30/2017 1:40:38 PM       Radiology Dg Chest 2 View  Result Date: 03/30/2017 CLINICAL DATA:  Shortness of breath since falling yesterday, LEFT arm pain, hypertension, diabetes mellitus, COPD, former smoker EXAM: CHEST  2 VIEW COMPARISON:  03/2017 FINDINGS: Enlargement of cardiac silhouette. Mediastinal contours and pulmonary vascularity normal. RIGHT basilar atelectasis. Underpenetration of LEFT lung base. Remaining lungs clear. Rotated to the LEFT. Central peribronchial thickening. No gross pleural effusion or pneumothorax. Bones demineralized. IMPRESSION: Enlargement of cardiac silhouette. Bronchitic changes with RIGHT basilar atelectasis. Electronically Signed   By: Lavonia Dana M.D.   On: 03/30/2017 13:01    Procedures Procedures (including critical care time) CRITICAL CARE Performed by: Charlesetta Shanks   Total critical care time:30 minutes  Critical care time was exclusive of separately billable procedures and treating other patients.  Critical care was necessary to treat or prevent imminent or life-threatening deterioration.  Critical care was time spent personally by me on the following activities: development of treatment plan with patient and/or surrogate as well as nursing, discussions with consultants, evaluation of patient's response to treatment, examination of patient, obtaining history from patient or surrogate, ordering and performing treatments and interventions, ordering and review of laboratory studies, ordering and review of radiographic studies, pulse oximetry and re-evaluation of patient's condition. Medications Ordered in ED Medications  vancomycin (VANCOCIN) 1,500 mg in sodium chloride 0.9 % 500 mL IVPB (not administered)  piperacillin-tazobactam (ZOSYN) IVPB 3.375 g (not administered)  insulin aspart (novoLOG) injection 0-20 Units (not administered)  hydrALAZINE (APRESOLINE) injection 10  mg (not administered)  0.9 %  sodium chloride infusion (not administered)  acetaminophen (TYLENOL) tablet 650 mg (not administered)    Or  acetaminophen (  TYLENOL) suppository 650 mg (not administered)  ondansetron (ZOFRAN) tablet 4 mg (not administered)    Or  ondansetron (ZOFRAN) injection 4 mg (not administered)  albuterol (PROVENTIL) (2.5 MG/3ML) 0.083% nebulizer solution 2.5 mg (not administered)  apixaban (ELIQUIS) tablet 10 mg (not administered)  colchicine tablet 0.6 mg (not administered)  insulin NPH Human (HUMULIN N,NOVOLIN N) injection 15-75 Units (not administered)  insulin aspart (novoLOG) injection 30 Units (not administered)  pantoprazole (PROTONIX) EC tablet 40 mg (not administered)  pravastatin (PRAVACHOL) tablet 40 mg (not administered)  rOPINIRole (REQUIP) tablet 4 mg (not administered)  acetaminophen (TYLENOL) tablet 1,000 mg (1,000 mg Oral Given 03/30/17 1322)  piperacillin-tazobactam (ZOSYN) IVPB 3.375 g (0 g Intravenous Stopped 03/30/17 1427)  vancomycin (VANCOCIN) 2,000 mg in sodium chloride 0.9 % 500 mL IVPB (2,000 mg Intravenous New Bag/Given 03/30/17 1327)     Initial Impression / Assessment and Plan / ED Course  I have reviewed the triage vital signs and the nursing notes.  Pertinent labs & imaging results that were available during my care of the patient were reviewed by me and considered in my medical decision making (see chart for details).       Final Clinical Impressions(s) / ED Diagnoses   Final diagnoses:  Sepsis, due to unspecified organism [facility])  Fall, initial encounter  Chest wall pain   In addition to fall and pain associated fall, patient is found to be significantly febrile and with leukocytosis. Sepsis protocol initiated. Patient admitted for ongoing treatment. New Prescriptions New Prescriptions   No medications on file     Charlesetta Shanks, MD 04/21/17 [phone]

## 2017-03-30 NOTE — ED Notes (Signed)
[facility] contacted to activate Code Stroke 

## 2017-03-31 ENCOUNTER — Inpatient Hospital Stay (HOSPITAL_COMMUNITY): Payer: PPO

## 2017-03-31 DIAGNOSIS — I5033 Acute on chronic diastolic (congestive) heart failure: Secondary | ICD-10-CM

## 2017-03-31 DIAGNOSIS — A4151 Sepsis due to Escherichia coli [E. coli]: Principal | ICD-10-CM

## 2017-03-31 DIAGNOSIS — R0603 Acute respiratory distress: Secondary | ICD-10-CM

## 2017-03-31 DIAGNOSIS — R7881 Bacteremia: Secondary | ICD-10-CM

## 2017-03-31 LAB — BASIC METABOLIC PANEL
Anion gap: 7 (ref 5–15)
Anion gap: 7 (ref 5–15)
BUN: 32 mg/dL — ABNORMAL HIGH (ref 6–20)
BUN: 35 mg/dL — ABNORMAL HIGH (ref 6–20)
CO2: 29 mmol/L (ref 22–32)
CO2: 32 mmol/L (ref 22–32)
Calcium: 8 mg/dL — ABNORMAL LOW (ref 8.9–10.3)
Calcium: 8.1 mg/dL — ABNORMAL LOW (ref 8.9–10.3)
Chloride: 103 mmol/L (ref 101–111)
Chloride: 104 mmol/L (ref 101–111)
Creatinine, Ser: 2.36 mg/dL — ABNORMAL HIGH (ref 0.44–1.00)
Creatinine, Ser: 2.51 mg/dL — ABNORMAL HIGH (ref 0.44–1.00)
GFR calc Af Amer: 23 mL/min — ABNORMAL LOW (ref >60)
GFR calc Af Amer: 21 mL/min — ABNORMAL LOW (ref >60)
GFR calc non Af Amer: 20 mL/min — ABNORMAL LOW (ref >60)
GFR calc non Af Amer: 18 mL/min — ABNORMAL LOW (ref >60)
Glucose, Bld: 241 mg/dL — ABNORMAL HIGH (ref 65–99)
Glucose, Bld: 142 mg/dL — ABNORMAL HIGH (ref 65–99)
Potassium: 4.9 mmol/L (ref 3.5–5.1)
Potassium: 4.8 mmol/L (ref 3.5–5.1)
Sodium: 139 mmol/L (ref 135–145)
Sodium: 143 mmol/L (ref 135–145)

## 2017-03-31 LAB — CBC
HCT: 36.5 % (ref 36.0–46.0)
Hemoglobin: 10.9 g/dL — ABNORMAL LOW (ref 12.0–15.0)
MCH: 29.2 pg (ref 26.0–34.0)
MCHC: 29.9 g/dL — ABNORMAL LOW (ref 30.0–36.0)
MCV: 97.9 fL (ref 78.0–100.0)
Platelets: 249 10*3/uL (ref 150–400)
RBC: 3.73 MIL/uL — ABNORMAL LOW (ref 3.87–5.11)
RDW: 15.9 % — ABNORMAL HIGH (ref 11.5–15.5)
WBC: 14.4 10*3/uL — ABNORMAL HIGH (ref 4.0–10.5)

## 2017-03-31 LAB — GLUCOSE, CAPILLARY
Glucose-Capillary: 174 mg/dL — ABNORMAL HIGH (ref 65–99)
Glucose-Capillary: 248 mg/dL — ABNORMAL HIGH (ref 65–99)
Glucose-Capillary: 213 mg/dL — ABNORMAL HIGH (ref 65–99)
Glucose-Capillary: 151 mg/dL — ABNORMAL HIGH (ref 65–99)
Glucose-Capillary: 134 mg/dL — ABNORMAL HIGH (ref 65–99)

## 2017-03-31 LAB — POCT I-STAT 3, ART BLOOD GAS (G3+)
Acid-Base Excess: 1 mmol/L (ref 0.0–2.0)
Acid-Base Excess: 4 mmol/L — ABNORMAL HIGH (ref 0.0–2.0)
Bicarbonate: 29.4 mmol/L — ABNORMAL HIGH (ref 20.0–28.0)
Bicarbonate: 32.1 mmol/L — ABNORMAL HIGH (ref 20.0–28.0)
O2 Saturation: 89 %
O2 Saturation: 99 %
Patient temperature: 98.9
Patient temperature: 98.9
TCO2: 31 mmol/L (ref 0–100)
TCO2: 34 mmol/L (ref 0–100)
pCO2 arterial: 62.8 mmHg — ABNORMAL HIGH (ref 32.0–48.0)
pCO2 arterial: 68.1 mmHg (ref 32.0–48.0)
pH, Arterial: 7.28 — ABNORMAL LOW (ref 7.350–7.450)
pH, Arterial: 7.282 — ABNORMAL LOW (ref 7.350–7.450)
pO2, Arterial: 67 mmHg — ABNORMAL LOW (ref 83.0–108.0)
pO2, Arterial: 168 mmHg — ABNORMAL HIGH (ref 83.0–108.0)

## 2017-03-31 LAB — BLOOD GAS, ARTERIAL
Acid-base deficit: 2.6 mmol/L — ABNORMAL HIGH (ref 0.0–2.0)
Bicarbonate: 24.4 mmol/L (ref 20.0–28.0)
Delivery systems: POSITIVE
Drawn by: 290171
Expiratory PAP: 8
FIO2: 90
Inspiratory PAP: 14
O2 Saturation: 98.9 %
Patient temperature: 98.9
RATE: 14 resp/min
pCO2 arterial: 65.6 mmHg (ref 32.0–48.0)
pH, Arterial: 7.197 — CL (ref 7.350–7.450)
pO2, Arterial: 203 mmHg — ABNORMAL HIGH (ref 83.0–108.0)

## 2017-03-31 LAB — BLOOD CULTURE ID PANEL (REFLEXED)

## 2017-03-31 LAB — TROPONIN I
Troponin I: 0.18 ng/mL (ref <0.03)
Troponin I: 0.05 ng/mL (ref <0.03)
Troponin I: 0.03 ng/mL (ref <0.03)

## 2017-03-31 LAB — LACTIC ACID, PLASMA
Lactic Acid, Venous: 0.7 mmol/L (ref 0.5–1.9)
Lactic Acid, Venous: 0.7 mmol/L (ref 0.5–1.9)

## 2017-03-31 LAB — MRSA PCR SCREENING: MRSA by PCR: NEGATIVE

## 2017-03-31 LAB — PROCALCITONIN: Procalcitonin: 1.14 ng/mL

## 2017-03-31 MED ORDER — MORPHINE SULFATE (PF) 4 MG/ML IV SOLN
2.0000 mg | INTRAVENOUS | Status: DC | PRN
Start: 2017-03-31 — End: 2017-04-01
  Administered 2017-03-31 – 2017-04-01 (×3): 2 mg via INTRAVENOUS
  Filled 2017-03-31 (×3): qty 1

## 2017-03-31 MED ORDER — INSULIN ASPART 100 UNIT/ML ~~LOC~~ SOLN
2.0000 [IU] | SUBCUTANEOUS | Status: DC
  Administered 2017-03-31: 2 [IU] via SUBCUTANEOUS
  Administered 2017-03-31: 4 [IU] via SUBCUTANEOUS
  Administered 2017-03-31 – 2017-04-01 (×3): 6 [IU] via SUBCUTANEOUS
  Administered 2017-04-01 (×2): 2 [IU] via SUBCUTANEOUS
  Administered 2017-04-01: 4 [IU] via SUBCUTANEOUS
  Administered 2017-04-02: 6 [IU] via SUBCUTANEOUS

## 2017-03-31 MED ORDER — NALOXONE HCL 0.4 MG/ML IJ SOLN
0.4000 mg | INTRAMUSCULAR | Status: DC | PRN
  Administered 2017-03-31: 0.4 mg via INTRAVENOUS

## 2017-03-31 MED ORDER — FUROSEMIDE 10 MG/ML IJ SOLN
INTRAMUSCULAR | Status: AC
  Filled 2017-03-31: qty 4

## 2017-03-31 MED ORDER — MORPHINE SULFATE (PF) 2 MG/ML IV SOLN: 1.0000 mg | INTRAVENOUS | Status: DC | PRN

## 2017-03-31 MED ORDER — FUROSEMIDE 10 MG/ML IJ SOLN: 40.0000 mg | INTRAMUSCULAR | Status: AC

## 2017-03-31 MED ORDER — FUROSEMIDE 10 MG/ML IJ SOLN
40.0000 mg | INTRAMUSCULAR | Status: AC
  Administered 2017-03-31: 40 mg via INTRAVENOUS

## 2017-03-31 MED ORDER — MORPHINE SULFATE (PF) 4 MG/ML IV SOLN: 1.0000 mg | INTRAVENOUS | Status: DC | PRN

## 2017-03-31 MED ORDER — NITROGLYCERIN IN D5W 200-5 MCG/ML-% IV SOLN: 0.0000 ug/min | INTRAVENOUS | Status: DC

## 2017-03-31 MED ORDER — NALOXONE HCL 0.4 MG/ML IJ SOLN
INTRAMUSCULAR | Status: AC
  Filled 2017-03-31: qty 1

## 2017-03-31 NOTE — Progress Notes (Signed)
Pt transported to CT  And back to 2H07on BIPAP without complications. RT will cont to monitor

## 2017-03-31 NOTE — Progress Notes (Signed)
PHARMACY - PHYSICIAN COMMUNICATION CRITICAL VALUE ALERT - BLOOD CULTURE IDENTIFICATION (BCID)  Results for orders placed or performed during the hospital encounter of 03/30/17  Blood Culture ID Panel (Reflexed) (Collected: 03/30/2017 12:28 PM)  Result Value Ref Range   Enterococcus species NOT DETECTED NOT DETECTED   Listeria monocytogenes NOT DETECTED NOT DETECTED   Staphylococcus species NOT DETECTED NOT DETECTED   Staphylococcus aureus NOT DETECTED NOT DETECTED   Streptococcus species NOT DETECTED NOT DETECTED   Streptococcus agalactiae NOT DETECTED NOT DETECTED   Streptococcus pneumoniae NOT DETECTED NOT DETECTED   Streptococcus pyogenes NOT DETECTED NOT DETECTED   Acinetobacter baumannii NOT DETECTED NOT DETECTED   Enterobacteriaceae species DETECTED (A) NOT DETECTED   Enterobacter cloacae complex NOT DETECTED NOT DETECTED   Escherichia coli DETECTED (A) NOT DETECTED   Klebsiella oxytoca NOT DETECTED NOT DETECTED   Klebsiella pneumoniae NOT DETECTED NOT DETECTED   Proteus species NOT DETECTED NOT DETECTED   Serratia marcescens NOT DETECTED NOT DETECTED   Carbapenem resistance NOT DETECTED NOT DETECTED   Haemophilus influenzae NOT DETECTED NOT DETECTED   Neisseria meningitidis NOT DETECTED NOT DETECTED   Pseudomonas aeruginosa NOT DETECTED NOT DETECTED   Candida albicans NOT DETECTED NOT DETECTED   Candida glabrata NOT DETECTED NOT DETECTED   Candida krusei NOT DETECTED NOT DETECTED   Candida parapsilosis NOT DETECTED NOT DETECTED   Candida tropicalis NOT DETECTED NOT DETECTED   Name of physician (or Provider) Contacted: Dr. Lamonte Sakai  Changes to prescribed antibiotics required: Patient currently on vancomycin and zosyn. Recommend discontinuing vanc/zosyn and starting ceftriaxone 2g IV q24h. Due to concern for possible intra-abdominal process, will discontinue vancomycin and continue zosyn.   Dimitri Ped, PharmD, BCPS PGY-2 Infectious Diseases Pharmacy Resident Pager:  [phone] 03/31/2017  8:21 AM

## 2017-03-31 NOTE — Progress Notes (Signed)
RT notified MD of ABG critical values.

## 2017-03-31 NOTE — Progress Notes (Signed)
PROGRESS NOTE                                                                                                                                                                                                             Patient Demographics:    Jean Mcclain, is a 71 y.o. female, DOB - Oct 02, 1946, JME:[id number]  Admit date - 03/30/2017   Admitting Physician Elwin Mocha, MD  Outpatient Primary MD for the patient is Leanna Battles, MD  LOS - 1  Chief Complaint  Patient presents with  . Fall       Brief Narrative    71 y.o. female  with past medical history of diabetes, reflux, sleep apnea, osteopenia presented to the emergency room after fall downstairs, workup significant for sepsis secondary to pyelonephritis/bacteremia.   Subjective:    Jean Mcclain today Is extremely confused and agitated, denies any chest pain, complains of left flank pain, no nausea or vomiting    Assessment  & Plan :    Principal Problem:   Sepsis ([place]) Active Problems:   Insulin-requiring or dependent type II diabetes mellitus ([place])   Essential hypertension   AKI (acute kidney injury) ([place])   Acute respiratory distress   Acute on chronic diastolic CHF (congestive heart failure) ([place])   Sepsis secondary to Escherichia coli pyelonephritis/bacteremia - Patient presents with fever, tachycardia, tachypnea, leukocytosis, and encephalopathy with worsening renal function - Blood culture reflects growing Escherichia coli, patient with positive urinalysis. - Continue to trend pro-calcitonin, lactic acid within normal limits - Continue with IV vancomycin and Zosyn, will DC vancomycin next 24 hours if continues to improve  Acute hypoxic/hypercarbic respiratory failure - This is most likely related to sepsis, and some volume overload, continue with BiPAP, PCCM input greatly appreciated, continue with BiPAP - Continue with Lasix when necessary  Elevated troponin - The  setting of demand ischemia secondary to sepsis and respiratory failure, she denies any chest pain, trend troponins, recent echo on 6/2, with a preserved EF, no regional wall motion abnormalities.  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 - Secondary to sepsis, baseline creatinine 1.8, today is 2.3, continue  to monitor closely as receiving when necessary Lasix for volume overload  Gout - Continue allopurinol, colchicine  Diabetes - Continue with insulin sliding scale  GERD - Cont PPI  Hyperlipidemia - Continue statin  RLS - Cont requip bid  Code Status :Full  Family Communication  : Brother at bedside  Disposition Plan  : Pending further workup  Consults  :  PC CM  Procedures  : None  DVT Prophylaxis  :  Eliquis  Lab Results  Component Value Date   PLT 249 03/31/2017    Antibiotics  :    Anti-infectives    Start     Dose/Rate Route Frequency Ordered Stop   04/01/17 1300  vancomycin (VANCOCIN) 1,500 mg in sodium chloride 0.9 % 500 mL IVPB  Status:  Discontinued     1,500 mg 250 mL/hr over 120 Minutes Intravenous Every 48 hours 03/30/17 1216 03/31/17 0852   03/30/17 2100  piperacillin-tazobactam (ZOSYN) IVPB 3.375 g     3.375 g 12.5 mL/hr over 240 Minutes Intravenous Every 8 hours 03/30/17 1216     03/30/17 1230  vancomycin (VANCOCIN) 2,000 mg in sodium chloride 0.9 % 500 mL IVPB     2,000 mg 250 mL/hr over 120 Minutes Intravenous  Once 03/30/17 1202 03/30/17 1535   03/30/17 1200  piperacillin-tazobactam (ZOSYN) IVPB 3.375 g     3.375 g 100 mL/hr over 30 Minutes Intravenous  Once 03/30/17 1155 03/30/17 1427   03/30/17 1200  vancomycin (VANCOCIN) IVPB 1000 mg/200 mL premix  Status:  Discontinued     1,000 mg 200 mL/hr over 60 Minutes Intravenous  Once 03/30/17 1155  03/30/17 1202        Objective:   Vitals:   03/31/17 0755 03/31/17 0800 03/31/17 0900 03/31/17 1000  BP: 140/73 (!) 124/57 (!) 136/57 (!) 114/50  Pulse: (!) 107 (!) 103 (!) 103 88  Resp: 20 15 (!) 24 10  Temp:      TempSrc:      SpO2: 100% 99% 99% 99%  Weight:      Height:        Wt Readings from Last 3 Encounters:  03/30/17 128.4 kg (283 lb)  03/13/17 113.4 kg (250 lb)  03/12/17 112.9 kg (248 lb 12.8 oz)     Intake/Output Summary (Last 24 hours) at 03/31/17 1143 Last data filed at 03/31/17 0700  Gross per 24 hour  Intake              650 ml  Output             1475 ml  Net             -825 ml     Physical Exam  Awake, confused, agitated  Supple Neck,No JVD, Symmetrical Chest wall movement, Good air movement bilaterally, CTAB, but has increased work of breathing and some use of accessory muscles Tachycardic but regular, No Gallops,Rubs or new Murmurs, No Parasternal Heave +ve B.Sounds, Abd Soft, No tenderness, No rebound - guarding or rigidity, some left CVA tenderness No Cyanosis, Clubbing or edema, No new Rash or bruise     Data Review:    CBC  Recent Labs Lab 03/30/17 1128 03/31/17 0756  WBC 14.1* 14.4*  HGB 11.0* 10.9*  HCT 35.5* 36.5  PLT 227 249  MCV 95.2 97.9  MCH 29.5 29.2  MCHC 31.0 29.9*  RDW 15.9* 15.9*  LYMPHSABS 1.0  --   MONOABS 1.1*  --   EOSABS 0.0  --  BASOSABS 0.0  --     Chemistries   Recent Labs Lab 03/30/17 1128 03/31/17 0756  NA 134* 139  K 4.8 4.9  CL 101 103  CO2 23 29  GLUCOSE 282* 241*  BUN 31* 32*  CREATININE 2.31* 2.36*  CALCIUM 8.1* 8.0*  MG 1.8  --   AST 24  --   ALT 23  --   ALKPHOS 97  --   BILITOT 0.7  --    ------------------------------------------------------------------------------------------------------------------ No results for input(s): CHOL, HDL, LDLCALC, TRIG, CHOLHDL, LDLDIRECT in the last 72 hours.  Lab Results  Component Value Date   HGBA1C 7.1 (H) 03/11/2017    ------------------------------------------------------------------------------------------------------------------ No results for input(s): TSH, T4TOTAL, T3FREE, THYROIDAB in the last 72 hours.  Invalid input(s): FREET3 ------------------------------------------------------------------------------------------------------------------ No results for input(s): VITAMINB12, FOLATE, FERRITIN, TIBC, IRON, RETICCTPCT in the last 72 hours.  Coagulation profile  Recent Labs Lab 03/30/17 1128  INR 1.65    No results for input(s): DDIMER in the last 72 hours.  Cardiac Enzymes  Recent Labs Lab 03/31/17 0756  TROPONINI 0.18*   ------------------------------------------------------------------------------------------------------------------ No results found for: BNP  Inpatient Medications  Scheduled Meds: . apixaban  5 mg Oral BID  . colchicine  0.6 mg Oral Daily  . furosemide      . furosemide  40 mg Intravenous STAT  . insulin aspart  2-6 Units Subcutaneous Q4H  . naloxone      . pantoprazole  40 mg Oral Daily  . pravastatin  40 mg Oral q1800  . rOPINIRole  4 mg Oral BID   Continuous Infusions: . piperacillin-tazobactam (ZOSYN)  IV Stopped (03/31/17 1001)   PRN Meds:.acetaminophen **OR** acetaminophen, albuterol, hydrALAZINE, naloxone, ondansetron **OR** ondansetron (ZOFRAN) IV  Micro Results Recent Results (from the past 240 hour(s))  Culture, blood (Routine x 2)     Status: None (Preliminary result)   Collection Time: 03/30/17 12:28 PM  Result Value Ref Range Status   Specimen Description BLOOD RIGHT FOREARM  Final   Special Requests   Final    BOTTLES DRAWN AEROBIC AND ANAEROBIC Blood Culture adequate volume   Culture  Setup Time   Final    GRAM NEGATIVE RODS ANAEROBIC BOTTLE ONLY CRITICAL RESULT CALLED TO, READ BACK BY AND VERIFIED WITH: PHARMD T STONE [id number] MLM    Culture GRAM NEGATIVE RODS  Final   Report Status PENDING  Incomplete  Blood Culture ID  Panel (Reflexed)     Status: Abnormal   Collection Time: 03/30/17 12:28 PM  Result Value Ref Range Status   Enterococcus species NOT DETECTED NOT DETECTED Final   Listeria monocytogenes NOT DETECTED NOT DETECTED Final   Staphylococcus species NOT DETECTED NOT DETECTED Final   Staphylococcus aureus NOT DETECTED NOT DETECTED Final   Streptococcus species NOT DETECTED NOT DETECTED Final   Streptococcus agalactiae NOT DETECTED NOT DETECTED Final   Streptococcus pneumoniae NOT DETECTED NOT DETECTED Final   Streptococcus pyogenes NOT DETECTED NOT DETECTED Final   Acinetobacter baumannii NOT DETECTED NOT DETECTED Final   Enterobacteriaceae species DETECTED (A) NOT DETECTED Final    Comment: Enterobacteriaceae represent a large family of gram-negative bacteria, not a single organism. CRITICAL RESULT CALLED TO, READ BACK BY AND VERIFIED WITH: PHARMD T STONE [id number] MLM    Enterobacter cloacae complex NOT DETECTED NOT DETECTED Final   Escherichia coli DETECTED (A) NOT DETECTED Final    Comment: CRITICAL RESULT CALLED TO, READ BACK BY AND VERIFIED WITH: PHARMD T STONE [id number] MLM  Klebsiella oxytoca NOT DETECTED NOT DETECTED Final   Klebsiella pneumoniae NOT DETECTED NOT DETECTED Final   Proteus species NOT DETECTED NOT DETECTED Final   Serratia marcescens NOT DETECTED NOT DETECTED Final   Carbapenem resistance NOT DETECTED NOT DETECTED Final   Haemophilus influenzae NOT DETECTED NOT DETECTED Final   Neisseria meningitidis NOT DETECTED NOT DETECTED Final   Pseudomonas aeruginosa NOT DETECTED NOT DETECTED Final   Candida albicans NOT DETECTED NOT DETECTED Final   Candida glabrata NOT DETECTED NOT DETECTED Final   Candida krusei NOT DETECTED NOT DETECTED Final   Candida parapsilosis NOT DETECTED NOT DETECTED Final   Candida tropicalis NOT DETECTED NOT DETECTED Final  MRSA PCR Screening     Status: None   Collection Time: 03/31/17  5:32 AM  Result Value Ref Range Status   MRSA by  PCR NEGATIVE NEGATIVE Final    Comment:        The GeneXpert MRSA Assay (FDA approved for NASAL specimens only), is one component of a comprehensive MRSA colonization surveillance program. It is not intended to diagnose MRSA infection nor to guide or monitor treatment for MRSA infections.     Radiology Reports Dg Chest 2 View  Result Date: 03/30/2017 CLINICAL DATA:  Shortness of breath since falling yesterday, LEFT arm pain, hypertension, diabetes mellitus, COPD, former smoker EXAM: CHEST  2 VIEW COMPARISON:  03/2017 FINDINGS: Enlargement of cardiac silhouette. Mediastinal contours and pulmonary vascularity normal. RIGHT basilar atelectasis. Underpenetration of LEFT lung base. Remaining lungs clear. Rotated to the LEFT. Central peribronchial thickening. No gross pleural effusion or pneumothorax. Bones demineralized. IMPRESSION: Enlargement of cardiac silhouette. Bronchitic changes with RIGHT basilar atelectasis. Electronically Signed   By: Lavonia Dana M.D.   On: 03/30/2017 13:01   Dg Chest 2 View  Result Date: 03/11/2017 CLINICAL DATA:  Acute onset of wheezing. Known DVT. Initial encounter. EXAM: CHEST  2 VIEW COMPARISON:  Chest radiograph performed 10/01/2009 FINDINGS: The lungs are well-aerated. Pulmonary vascularity is at the upper limits of normal. There is no evidence of focal opacification, pleural effusion or pneumothorax. The heart is normal in size; the mediastinal contour is within normal limits. No acute osseous abnormalities are seen. Anterior bridging osteophytes are noted along the lower thoracic spine. IMPRESSION: No acute cardiopulmonary process seen. Electronically Signed   By: Garald Balding M.D.   On: 03/11/2017 01:03   Dg Ankle 2 Views Left  Result Date: 03/02/2017 Please see detailed radiograph report in office note.  Ct Head Wo Contrast  Result Date: 03/31/2017 CLINICAL DATA:  Altered mental status. EXAM: CT HEAD WITHOUT CONTRAST TECHNIQUE: Contiguous axial images  were obtained from the base of the skull through the vertex without intravenous contrast. COMPARISON:  CT scan of October 01, 2009. FINDINGS: Brain: Mild chronic ischemic white matter disease is noted. No mass effect or midline shift is noted. Ventricular size is within normal limits. There is no evidence of mass lesion, hemorrhage or acute infarction. Vascular: Atherosclerosis of carotid siphons is noted. Skull: Normal. Negative for fracture or focal lesion. Sinuses/Orbits: No acute finding. Other: None. IMPRESSION: Mild chronic ischemic white matter disease. No acute intracranial abnormality seen. Electronically Signed   By: Marijo Conception, M.D.   On: 03/31/2017 09:11   US Abdomen Complete  Result Date: 03/31/2017 CLINICAL DATA:  Left flank pain.  Generalized abdominal pain. EXAM: ABDOMEN ULTRASOUND COMPLETE COMPARISON:  Abdomen 03/31/2017. Ultrasound 09/11/2015. CT 12/12/2012. CT 04/07/2010 FINDINGS: Gallbladder: No gallstones or wall thickening visualized. No sonographic Murphy sign noted by  sonographer. Common bile duct: Diameter: 4.1 mm Liver: No focal lesion identified. Within normal limits in parenchymal echogenicity. IVC: No abnormality visualized. Pancreas: Visualized portion unremarkable. Spleen: Size and appearance within normal limits. Right Kidney: Length: 10.7 cm. Cortical thinning . Increased echogenicity. 1.1 cm stable simple cyst. No hydronephrosis visualized. Left Kidney: Length: 11.1 cm. Cortical thinning. Increased echogenicity. No mass or hydronephrosis visualized. Abdominal aorta: No aneurysm visualized. Other findings: None. IMPRESSION: 1. Increased renal echogenicity consistent with chronic renal disease. Bilateral renal atrophy again noted. No acute renal abnormality. No hydronephrosis. 2.  No gallstones or biliary distention. Electronically Signed   By: Marcello Moores  Register   On: 03/31/2017 11:14   Dg Chest Port 1 View  Result Date: 03/31/2017 CLINICAL DATA:  Initial evaluation for  acute respiratory distress. EXAM: PORTABLE CHEST 1 VIEW COMPARISON:  Prior radiograph from 03/30/2017. FINDINGS: Study limited by patient positioning. Grossly stable cardiomegaly. Mediastinal silhouette within normal limits. Lungs hypoinflated. Increased pulmonary vascularity with interstitial prominence, suggestive of congestion without frank pulmonary edema. Patchy bibasilar opacities favored to reflect atelectasis/ bronchovascular crowding and/ or congestion. Superimposed infiltrates would be difficult to exclude. Suspected left pleural effusion. No pneumothorax. No acute osseus abnormality. Remote right-sided rib fractures noted. IMPRESSION: 1. Stable cardiomegaly with increased pulmonary vascular congestion and interstitial prominence as compared to previous, consistent with pulmonary vascular congestion. No frank pulmonary edema. 2. Suspected left pleural effusion. 3. Shallow lung inflation. Patchy bibasilar opacities favored to reflect atelectasis/bronchovascular crowding and/or edema. Superimposed infiltrates would be difficult to exclude, and could be considered in the correct clinical setting. Electronically Signed   By: Jeannine Boga M.D.   On: 03/31/2017 04:45   Dg Abd Portable 1v  Result Date: 03/31/2017 CLINICAL DATA:  Abdominal pain and distention. EXAM: PORTABLE ABDOMEN - 1 VIEW COMPARISON:  CT abdomen and pelvis 12/12/2012. FINDINGS: Image quality is degraded by patient body habitus. The far left lateral aspect of the abdomen was incompletely imaged. No gross intraperitoneal free air is identified on this supine study. Gas and a small amount of stool are present in the colon. No dilated loops of bowel are seen to suggest obstruction. 3 cm oval calcification in the right pelvis corresponds to a chronic peripherally calcified right ovarian lesion, grossly similar in size to the prior CT. Asymmetric right hip osteoarthrosis is noted, and there is diffuse thoracolumbar spondylosis. Asymmetric  left basilar lung opacity is more fully evaluated on today's earlier chest radiographs. IMPRESSION: Nonobstructed bowel gas pattern. Electronically Signed   By: Logan Bores M.D.   On: 03/31/2017 07:26   Dg Foot Complete Left  Result Date: 03/02/2017 Please see detailed radiograph report in office note.    [facility], Kivon Aprea M.D on 03/31/2017 at 11:43 AM  Between 7am to 7pm - Pager - [phone]  After 7pm go to [email or web address] - password [facility]  [facility] -  Office  [phone]

## 2017-03-31 NOTE — Significant Event (Signed)
Rapid Response Event Note  Overview: Time Called: 0351 Arrival Time: 0352 Event Type: Respiratory  Initial Focused Assessment:  Called for respiratory distress.  Pt with labored, rapid and shallow breathing pattern, minimally responsive on Venti mask with saturations in low 80s.  NRB placed at 15lpm, sats improved to upper 80s with continued increased WOB.  Diminished throughout all lung fields with expiratory wheezing.  Notified respiratory, pt placed on bipap.  STAT pcxr completed.  ABG done, pH 7.19, CO2 65.6, PO2 203, bicarb 24.4.  Concern for airway protection.  CCM consulted by [facility].  VS on Bipap:  143/62, 99% spo2, HR 110, RR 28.    Pt received morphine multiple times throughout the night, last at 0056.  Narcan 0.4mg  given with some improvement in alertness, able to open eyes to voice and state name.  Dr. Myna Hidalgo at bedside, concerned for fluid overload, lasix 40 mg given.  Foley catheter placed, 900 ml voided immediately.    Approximately 45 minutes after Bipap started, pt still with labored breathing, tachypnea, accessory muscle use and AMS/ decreased responsiveness.  Dr. Jimmy Footman notified at [facility], and order received for ICU bed.  Pt transferred to 2H07.  [name] brother notified of change in status.       Event Summary: Name of Physician Notified: Dr. Myna Hidalgo at [phone]  Name of Consulting Physician Notified: Dr. Jimmy Footman at [phone]  Outcome: Transferred (Comment)  Event End Time: 0530  Pam Drown

## 2017-03-31 NOTE — Progress Notes (Signed)
Called regarding acute respiratory distress.   PMHx includes OSA, DVT on Eliquis, IDDM, chronic diastolic CHF.   Pt was admitted yesterday afternoon for sepsis, suspected secondary to UTI and started on vanc, Zosyn, and IVF with NS at 125 cc/hr.   Developed acute resp distress, given Narcan without change. RRT called, placed on BiPAP. ABG with respiratory acidosis. CXR performed, read pending, looks congested.   She is tachypneic with accessory muscle recruitment. Peripheral edema noted, difficult to determine JVP.   Lasix 40 mg qD was held on prior admission 3 wks ago and discontinued at discharge d/t renal insufficiency. Wt is up 35 lbs from the admission earlier this month, up 60 lbs from office visit in May '18.   Given 40 mg IV Lasix STAT.  PCCM consulted and much appreciated; advised another 40 mg IV Lasix STAT and will have pt seen.   Continue BiPAP, stop IVF, follow-up CXR read. Follow-up PCCM recs.   38 mins critical care time spent.

## 2017-03-31 NOTE — Progress Notes (Signed)
Pt arrived to floor accompanied by nurse tech. Pt is complaining of extreme pain on left side of the body from falling today down the stairs. She does not want to be turned or for [place] to touch her at all. She stated that they did not give her any pain medications in the ED. I ordered pain medication for pt. Unable to perform an admission right now. Will complete when pt is more appropriate. Pain medication given and pt is in bed with call bell within reach.

## 2017-03-31 NOTE — Progress Notes (Signed)
Troponin 0.18. MD made aware. No new orders. Will continue to monitor the patient closely.

## 2017-03-31 NOTE — Progress Notes (Signed)
When pt arrived to floor she complained of extreme pain from falling at home. Pain medication was given to her. It helped pt get better rest and relief. Later in the night pt was cleaned up. Pain medication was requested and given to pt due to extreme pain. Pt was put on a venti O2 was put on pt by RT for better O2 saturation because her saturations were in the high 80s. Venti mask helped pt get better oxygen. Later central tele called to say pt's saturation was in the low 80s.   Upon assessment of the pt she was not able to answer any questions appropriately and had labored breathing. Rapid response and respiratory was called. MD notified. Pt put on bipap and narcan given. Pt became a little more arouse. Foley inserted and ABG taken. Pt moved to 2H07 and rapid notified pt's brother of move. Reported called and given to [facility] [place] on [facility].

## 2017-03-31 NOTE — Consult Note (Signed)
PULMONARY / CRITICAL CARE MEDICINE   Name: Jean Mcclain MRN: [id number] DOB: 07-Sep-1946    ADMISSION DATE:  03/30/2017 CONSULTATION DATE:  6/22  REFERRING MD:  Dr. Waldron Labs  CHIEF COMPLAINT:  Resp distress  HISTORY OF PRESENT ILLNESS:  Patient is encephalopathic and/or intubated. Therefore history has been obtained from chart review.   71 year old female with PMH as below, which is significant for DM, OSA, HTD, HTN, and GERD. She recently suffered left fibular and navicular bone fracture with subsequent DVT. Surgery would be required for her fractures. She was admitted for DVT 03/11/2017 and treated with heparin and was transitioned to Eliquis at discharge 6/3.  She again presented to [facility] 6/21 with complaints of Left sided chest pain following a fall. She did describe dysuria and frequency in ED and was febrile. Reports chills over the past few days. Recently on ABX. She was admitted to the hospitalists for sepsis of unknown origin and treated with broad spectrum antibiotics. While on floor she was having terrible L sided chest pain and she was administered pain medication. She developed respiratory distress shortly after. She was given narcan with no response. ABG demonstrated resp acidosis with pH 7.18. She was started on BiPAP, given lasix, and moved to ICU. PCCM asked to see.   PAST MEDICAL HISTORY :  She  has a past medical history of Arthritis; Diabetes mellitus, type 2 ([place]); Facet joint disease ([place]); GERD (gastroesophageal reflux disease); Hyperlipidemia; Hypertension; OSA (obstructive sleep apnea); Osteopenia; Shortness of breath; and Vitamin D deficiency.  PAST SURGICAL HISTORY: She  has a past surgical history that includes Total knee arthroplasty (1999); Joint replacement; Ventral hernia repair (N/A, 01/08/2013); and Insertion of mesh (N/A, 01/08/2013).  No Known Allergies  No current facility-administered medications on file prior to encounter.    Current Outpatient  Prescriptions on File Prior to Encounter  Medication Sig  . apixaban (ELIQUIS) 5 MG TABS tablet Take 2 tablets (10 mg total) by mouth 2 (two) times daily. Then on 03/17/17, take 1 tabet (5 mg) two times daily.  . colchicine 0.6 MG tablet Take 0.6 mg by mouth daily.  . cyclobenzaprine (FLEXERIL) 10 MG tablet Take 10 mg by mouth 2 (two) times daily.   Marland Kitchen HYDROcodone-acetaminophen (NORCO) 7.5-325 MG tablet Take 1 tablet by mouth 2 (two) times daily as needed for moderate pain.   Marland Kitchen lovastatin (MEVACOR) 40 MG tablet Take 40 mg by mouth daily.   Marland Kitchen omeprazole (PRILOSEC) 20 MG capsule Take 20 mg by mouth daily.  Marland Kitchen rOPINIRole (REQUIP) 4 MG tablet Take 4 mg by mouth 2 (two) times daily.   Marland Kitchen allopurinol (ZYLOPRIM) 100 MG tablet Take 100 mg by mouth daily.  . ergocalciferol (VITAMIN D2) 50000 UNITS capsule Take 50,000 Units by mouth once a week. Patient takes on Fridays  . febuxostat (ULORIC) 40 MG tablet Take 40 mg by mouth daily.  Marland Kitchen gabapentin (NEURONTIN) 300 MG capsule Take 2 capsules (600 mg total) by mouth 2 (two) times daily. (Patient not taking: Reported on 03/30/2017)  . insulin NPH Human (HUMULIN N,NOVOLIN N) 100 UNIT/ML injection Inject 15-75 Units into the skin See admin instructions. Use 65 units every morning then use 15 units at lunch then use 75 units at dinner  . insulin regular (NOVOLIN R,HUMULIN R) 100 units/mL injection Inject 30 Units into the skin 3 (three) times daily before meals.  Marland Kitchen lisinopril (PRINIVIL,ZESTRIL) 5 MG tablet   . Triamcinolone Acetonide (TRIAMCINOLONE 0.1 % CREAM : EUCERIN) CREA Apply  1 application topically 3 (three) times daily as needed for itching. Use for two weeks    FAMILY HISTORY:  Her indicated that her mother is deceased. She indicated that her father is deceased. She indicated that her brother is alive.    SOCIAL HISTORY: She  reports that she quit smoking about 35 years ago. Her smoking use included Cigarettes. She quit after 27.00 years of use. She has  never used smokeless tobacco. She reports that she does not drink alcohol or use drugs.  REVIEW OF SYSTEMS:   Unable as patient is encephalopathic and agitated.   SUBJECTIVE:  Severe pain but cannot localize or describe  VITAL SIGNS: BP (!) 149/66 (BP Location: Left Arm)   Pulse (!) 112   Temp 98.9 F (37.2 C)   Resp (!) 22   Ht 5' (1.524 m)   Wt 128.4 kg (283 lb)   SpO2 100%   BMI 55.27 kg/m   HEMODYNAMICS:    VENTILATOR SETTINGS: FiO2 (%):  [50 %] 50 %  INTAKE / OUTPUT: I/O last 3 completed shifts: In: 550 [IV Piggyback:550] Out: -   PHYSICAL EXAMINATION: General:  Elderly female,  Obese. Agitated in mild distress Neuro:  Awake, alert. Oriented to self. Moaning. Will answer some questions when directed.  HEENT:  [place]/AT, PERRL, unable to appreciate JVD Cardiovascular:  Tachy, regular, no MRG Lungs:  Difficult to assess due to moaning and upper airway wheeze. No stridor.  Abdomen:  Soft, generalized tenderness,  Musculoskeletal:  NO acute deformity or ROM limitation Skin:  Grossly intact  LABS:  BMET  Recent Labs Lab 03/30/17 1128  NA 134*  K 4.8  CL 101  CO2 23  BUN 31*  CREATININE 2.31*  GLUCOSE 282*    Electrolytes  Recent Labs Lab 03/30/17 1128  CALCIUM 8.1*  MG 1.8  PHOS 2.9    CBC  Recent Labs Lab 03/30/17 1128  WBC 14.1*  HGB 11.0*  HCT 35.5*  PLT 227    Coag's  Recent Labs Lab 03/30/17 1128  INR 1.65    Sepsis Markers  Recent Labs Lab 03/30/17 1137 03/30/17 1555  LATICACIDVEN 0.91 0.72    ABG  Recent Labs Lab 03/31/17 0411  PHART 7.197*  PCO2ART 65.6*  PO2ART 203*    Liver Enzymes  Recent Labs Lab 03/30/17 1128  AST 24  ALT 23  ALKPHOS 97  BILITOT 0.7  ALBUMIN 2.9*    Cardiac Enzymes No results for input(s): TROPONINI, PROBNP in the last 168 hours.  Glucose  Recent Labs Lab 03/30/17 1803 03/31/17 0149  GLUCAP 269* 174*    Imaging Dg Chest 2 View  Result Date: 03/30/2017 CLINICAL  DATA:  Shortness of breath since falling yesterday, LEFT arm pain, hypertension, diabetes mellitus, COPD, former smoker EXAM: CHEST  2 VIEW COMPARISON:  03/2017 FINDINGS: Enlargement of cardiac silhouette. Mediastinal contours and pulmonary vascularity normal. RIGHT basilar atelectasis. Underpenetration of LEFT lung base. Remaining lungs clear. Rotated to the LEFT. Central peribronchial thickening. No gross pleural effusion or pneumothorax. Bones demineralized. IMPRESSION: Enlargement of cardiac silhouette. Bronchitic changes with RIGHT basilar atelectasis. Electronically Signed   By: Lavonia Dana M.D.   On: 03/30/2017 13:01   Dg Chest Port 1 View  Result Date: 03/31/2017 CLINICAL DATA:  Initial evaluation for acute respiratory distress. EXAM: PORTABLE CHEST 1 VIEW COMPARISON:  Prior radiograph from 03/30/2017. FINDINGS: Study limited by patient positioning. Grossly stable cardiomegaly. Mediastinal silhouette within normal limits. Lungs hypoinflated. Increased pulmonary vascularity with interstitial prominence, suggestive of  congestion without frank pulmonary edema. Patchy bibasilar opacities favored to reflect atelectasis/ bronchovascular crowding and/ or congestion. Superimposed infiltrates would be difficult to exclude. Suspected left pleural effusion. No pneumothorax. No acute osseus abnormality. Remote right-sided rib fractures noted. IMPRESSION: 1. Stable cardiomegaly with increased pulmonary vascular congestion and interstitial prominence as compared to previous, consistent with pulmonary vascular congestion. No frank pulmonary edema. 2. Suspected left pleural effusion. 3. Shallow lung inflation. Patchy bibasilar opacities favored to reflect atelectasis/bronchovascular crowding and/or edema. Superimposed infiltrates would be difficult to exclude, and could be considered in the correct clinical setting. Electronically Signed   By: Jeannine Boga M.D.   On: 03/31/2017 04:45    STUDIES:  KUB 6/22  > CT chest 6/22 > CT abd/pelvis 6/22> CT head 6/22 >  CULTURES: Blood 6/21 > E coli >>  Urine 6/21 >  ANTIBIOTICS: Zosyn 6/21 > Vancomycin 6/21 > 6/22  SIGNIFICANT EVENTS: 6/21 admit  LINES/TUBES:   DISCUSSION:   ASSESSMENT / PLAN:  PULMONARY A: Acute hypercarbic/hypoxemic respiratory failure: possibly narcotic related.  Pulmonary edema ? PE: recent hx DVT. Renal function will not allow contrast L chest pain OSA not on CPAP  P:   Unable to tolerate BiPAP Supplemental O2 titrated to sat 90-95% > currently 30% venti CT chest without contrast On eliquis for DVT, continue. May need to transition to heparin if unable to take PO Trend ABG Pulmonary hygeine Lasix 80mg  given.  CARDIOVASCULAR A:  Hypertensive urgency > improved likely pain related while on BiPAP  P:  EKG Telemetry Cycle CE  RENAL A:   AKI on CKD  P:   Repeat BMP pending  GASTROINTESTINAL A:   Abd pain GERD ? Gastric distension due to poor BiPAP compliance.  P:   KUB CT abdomen without contrast. PO if can tolerate.  NPO PPI  HEMATOLOGIC A:   Mild anemia DVT ? PE  P:  Follow CBC Eliquis, may need heparin transition.  INFECTIOUS A:   Sepsis secondary to urinary tract infection. Failed recent outpatient ABX  P:   Continue vanco, zosyn Follow cultures  ENDOCRINE A:   DM  P:   Change ssi to q4 hours DC NPH while NPO  NEUROLOGIC A:   Acute metabolic encephalopathy  P:   RASS goal: 0 Minimized sedative medications  Musculoskeletal A:   L fibula fracture L navicular fracture  P:   Mointor Splint in place   FAMILY  - Updates:   - Inter-disciplinary family meet or Palliative Care meeting due by:  6/28   Georgann Housekeeper, AGACNP-BC [place] [facility] Pager [phone] or [phone] 03/31/2017 7:00 AM   Attending Note:  I have examined patient, reviewed labs, studies and notes. I have discussed the case with Jaclynn Guarneri, and I agree  with the data and plans as amended above. 71 year old woman with a history of obesity, diabetes, hypertension, untreated obstructive sleep apnea, recent left lower extremity fracture with associated DVT treated with Eliquis. Unfortunately has been experiencing falls and was readmitted 6/21 with left-sided chest discomfort following a fall. Also noted some dysuria, fevers, chills. She was admitted and treated for sepsis. After admission she continued to have very severe left-sided pain, received narcotics. She experienced involving respiratory and generalized distress, was unable to localize her discomfort. Noted to have respiratory acidosis. Suspect that her ventilatory decompensation was multifactorial, due to her pain, narcotics, known obstructive sleep apnea. No evidence of an associated metabolic acidosis although she does have acute on chronic renal failure (baseline creatinine  1.8- 1.9). This morning on my evaluation she is more stable with BiPAP in place. She is able to converse, is oriented, answers questions appropriately. Her lungs are decreased at both bases but with good air movement anteriorly and no wheezing. Heart is regular without a murmur. She can now localize pain more effectively to her left flank and back. Blood cultures of just come back positive for Escherichia coli, sensitivities are pending. Because she was unable to describe her pain which initially ordered CT scans of the chest, abdomen and head. I will get the head CT given her recent altered mental status, discontinue the chest and abdominal CT, obtain an abdominal ultrasound given my suspicion that she has a left pyelonephritis. Narrow antibiotics to Zosyn alone, discontinue vancomycin. We will be able to narrow further after ultrasound is done, Escherichia coli sensitivities available. Hopefully we will be able to discontinue BiPAP later today. She needs to wear either BiPAP or CPAP daily at bedtime given her untreated obstructive  sleep apnea. Consider repeat ABG 6/22 afternoon depending on her clinical status. Independent critical care time is 50 minutes.   Baltazar Apo, MD, PhD 03/31/2017, 8:49 AM [place] [facility] [phone] or if no answer [phone]

## 2017-04-01 ENCOUNTER — Inpatient Hospital Stay (HOSPITAL_COMMUNITY): Payer: PPO

## 2017-04-01 DIAGNOSIS — N179 Acute kidney failure, unspecified: Secondary | ICD-10-CM

## 2017-04-01 LAB — BASIC METABOLIC PANEL
Anion gap: 11 (ref 5–15)
Anion gap: 9 (ref 5–15)
BUN: 39 mg/dL — ABNORMAL HIGH (ref 6–20)
BUN: 39 mg/dL — ABNORMAL HIGH (ref 6–20)
CO2: 28 mmol/L (ref 22–32)
CO2: 27 mmol/L (ref 22–32)
Calcium: 8 mg/dL — ABNORMAL LOW (ref 8.9–10.3)
Calcium: 8.4 mg/dL — ABNORMAL LOW (ref 8.9–10.3)
Chloride: 104 mmol/L (ref 101–111)
Chloride: 102 mmol/L (ref 101–111)
Creatinine, Ser: 2.66 mg/dL — ABNORMAL HIGH (ref 0.44–1.00)
Creatinine, Ser: 2.31 mg/dL — ABNORMAL HIGH (ref 0.44–1.00)
GFR calc Af Amer: 20 mL/min — ABNORMAL LOW (ref >60)
GFR calc Af Amer: 23 mL/min — ABNORMAL LOW (ref >60)
GFR calc non Af Amer: 17 mL/min — ABNORMAL LOW (ref >60)
GFR calc non Af Amer: 20 mL/min — ABNORMAL LOW (ref >60)
Glucose, Bld: 132 mg/dL — ABNORMAL HIGH (ref 65–99)
Glucose, Bld: 169 mg/dL — ABNORMAL HIGH (ref 65–99)
Potassium: 4.5 mmol/L (ref 3.5–5.1)
Potassium: 4.5 mmol/L (ref 3.5–5.1)
Sodium: 143 mmol/L (ref 135–145)
Sodium: 138 mmol/L (ref 135–145)

## 2017-04-01 LAB — CBC
HCT: 32.7 % — ABNORMAL LOW (ref 36.0–46.0)
Hemoglobin: 9.7 g/dL — ABNORMAL LOW (ref 12.0–15.0)
MCH: 29.1 pg (ref 26.0–34.0)
MCHC: 29.7 g/dL — ABNORMAL LOW (ref 30.0–36.0)
MCV: 98.2 fL (ref 78.0–100.0)
Platelets: 247 10*3/uL (ref 150–400)
RBC: 3.33 MIL/uL — ABNORMAL LOW (ref 3.87–5.11)
RDW: 15.9 % — ABNORMAL HIGH (ref 11.5–15.5)
WBC: 10.4 10*3/uL (ref 4.0–10.5)

## 2017-04-01 LAB — GLUCOSE, CAPILLARY
Glucose-Capillary: 105 mg/dL — ABNORMAL HIGH (ref 65–99)
Glucose-Capillary: 111 mg/dL — ABNORMAL HIGH (ref 65–99)
Glucose-Capillary: 111 mg/dL — ABNORMAL HIGH (ref 65–99)
Glucose-Capillary: 127 mg/dL — ABNORMAL HIGH (ref 65–99)
Glucose-Capillary: 144 mg/dL — ABNORMAL HIGH (ref 65–99)
Glucose-Capillary: 160 mg/dL — ABNORMAL HIGH (ref 65–99)

## 2017-04-01 LAB — MAGNESIUM: Magnesium: 2.2 mg/dL (ref 1.7–2.4)

## 2017-04-01 LAB — PROCALCITONIN: Procalcitonin: 1.02 ng/mL

## 2017-04-01 MED ORDER — SENNOSIDES-DOCUSATE SODIUM 8.6-50 MG PO TABS
2.0000 | ORAL_TABLET | Freq: Two times a day (BID) | ORAL | Status: DC
  Administered 2017-04-01 – 2017-04-07 (×12): 2 via ORAL
  Filled 2017-04-01 (×12): qty 2

## 2017-04-01 MED ORDER — MORPHINE SULFATE (PF) 4 MG/ML IV SOLN
2.0000 mg | INTRAVENOUS | Status: DC | PRN
  Administered 2017-04-01 – 2017-04-04 (×8): 4 mg via INTRAVENOUS
  Filled 2017-04-01 (×11): qty 1

## 2017-04-01 NOTE — Progress Notes (Signed)
Clarification: Pt unable to tolerate being off BiPap, WOB increases to 40-50/min., pt moans and states she cannot breathe. RN placed patient back on BiPap within 5 minutes of RT removing.

## 2017-04-01 NOTE — Progress Notes (Signed)
PULMONARY / CRITICAL CARE MEDICINE   Name: Jean Mcclain MRN: [id number] DOB: 1946/01/30    ADMISSION DATE:  03/30/2017 CONSULTATION DATE:  6/22  REFERRING MD:  Dr. Waldron Labs  CHIEF COMPLAINT:  Resp distress  HISTORY OF PRESENT ILLNESS:  Patient is encephalopathic and/or intubated. Therefore history has been obtained from chart review.   71 year old female with PMH as below, which is significant for DM, OSA, HTD, HTN, and GERD. She recently suffered left fibular and navicular bone fracture with subsequent DVT. Surgery would be required for her fractures. She was admitted for DVT 03/11/2017 and treated with heparin and was transitioned to Eliquis at discharge 6/3.  She again presented to [facility] 6/21 with complaints of Left sided chest pain following a fall. She did describe dysuria and frequency in ED and was febrile. Reports chills over the past few days. Recently on ABX. She was admitted to the hospitalists for sepsis of unknown origin and treated with broad spectrum antibiotics. While on floor she was having terrible L sided chest pain and she was administered pain medication. She developed respiratory distress shortly after. She was given narcan with no response. ABG demonstrated resp acidosis with pH 7.18. She was started on BiPAP, given lasix, and moved to ICU. PCCM asked to see.   SUBJECTIVE:  Still with abdominal/flank pain She came off of BiPAP around midnight  abdominal ultrasound reassuring as below  VITAL SIGNS: BP 134/65   Pulse 81   Temp 98.6 F (37 C) (Oral)   Resp 17   Ht 5' (1.524 m)   Wt 128.4 kg (283 lb)   SpO2 100%   BMI 55.27 kg/m   HEMODYNAMICS:    VENTILATOR SETTINGS: FiO2 (%):  [40 %-60 %] 50 %  INTAKE / OUTPUT: I/O last 3 completed shifts: In: 700 [IV Piggyback:700] Out: 2875 [Urine:2875]  PHYSICAL EXAMINATION: General:  Elderly female,  Obese. Agitated in mild distress Neuro:  Awake, alert. Oriented to self. Moaning. Will answer some questions  when directed.  HEENT:  [place]/AT, PERRL, unable to appreciate JVD Cardiovascular:  Tachy, regular, no MRG Lungs:  Difficult to assess due to moaning and upper airway wheeze. No stridor.  Abdomen:  Soft, generalized tenderness,  Musculoskeletal:  NO acute deformity or ROM limitation Skin:  Grossly intact  LABS:  BMET  Recent Labs Lab 03/31/17 0756 03/31/17 1849 04/01/17 0207  NA 139 143 143  K 4.9 4.8 4.5  CL 103 104 104  CO2 29 32 28  BUN 32* 35* 39*  CREATININE 2.36* 2.51* 2.66*  GLUCOSE 241* 142* 132*    Electrolytes  Recent Labs Lab 03/30/17 1128 03/31/17 0756 03/31/17 1849 04/01/17 0207  CALCIUM 8.1* 8.0* 8.1* 8.0*  MG 1.8  --   --  2.2  PHOS 2.9  --   --   --     CBC  Recent Labs Lab 03/30/17 1128 03/31/17 0756 04/01/17 0207  WBC 14.1* 14.4* 10.4  HGB 11.0* 10.9* 9.7*  HCT 35.5* 36.5 32.7*  PLT 227 249 247    Coag's  Recent Labs Lab 03/30/17 1128  INR 1.65    Sepsis Markers  Recent Labs Lab 03/30/17 1555 03/31/17 0755 03/31/17 0813 03/31/17 1234 04/01/17 0207  LATICACIDVEN 0.72 0.7  --  0.7  --   PROCALCITON  --   --  1.14  --  1.02    ABG  Recent Labs Lab 03/31/17 0411 03/31/17 0749 03/31/17 1218  PHART 7.197* 7.280* 7.282*  PCO2ART 65.6* 62.8*  68.1*  PO2ART 203* 67.0* 168.0*    Liver Enzymes  Recent Labs Lab 03/30/17 1128  AST 24  ALT 23  ALKPHOS 97  BILITOT 0.7  ALBUMIN 2.9*    Cardiac Enzymes  Recent Labs Lab 03/31/17 0756 03/31/17 1234 03/31/17 1849  TROPONINI 0.18* 0.05* 0.03*    Glucose  Recent Labs Lab 03/31/17 0758 03/31/17 1215 03/31/17 1549 03/31/17 1951 03/31/17 2350 04/01/17 0347  GLUCAP 248* 213* 151* 134* 111* 127*    Imaging Ct Head Wo Contrast  Result Date: 03/31/2017 CLINICAL DATA:  Altered mental status. EXAM: CT HEAD WITHOUT CONTRAST TECHNIQUE: Contiguous axial images were obtained from the base of the skull through the vertex without intravenous contrast. COMPARISON:  CT  scan of October 01, 2009. FINDINGS: Brain: Mild chronic ischemic white matter disease is noted. No mass effect or midline shift is noted. Ventricular size is within normal limits. There is no evidence of mass lesion, hemorrhage or acute infarction. Vascular: Atherosclerosis of carotid siphons is noted. Skull: Normal. Negative for fracture or focal lesion. Sinuses/Orbits: No acute finding. Other: None. IMPRESSION: Mild chronic ischemic white matter disease. No acute intracranial abnormality seen. Electronically Signed   By: Marijo Conception, M.D.   On: 03/31/2017 09:11   US Abdomen Complete  Result Date: 03/31/2017 CLINICAL DATA:  Left flank pain.  Generalized abdominal pain. EXAM: ABDOMEN ULTRASOUND COMPLETE COMPARISON:  Abdomen 03/31/2017. Ultrasound 09/11/2015. CT 12/12/2012. CT 04/07/2010 FINDINGS: Gallbladder: No gallstones or wall thickening visualized. No sonographic Murphy sign noted by sonographer. Common bile duct: Diameter: 4.1 mm Liver: No focal lesion identified. Within normal limits in parenchymal echogenicity. IVC: No abnormality visualized. Pancreas: Visualized portion unremarkable. Spleen: Size and appearance within normal limits. Right Kidney: Length: 10.7 cm. Cortical thinning . Increased echogenicity. 1.1 cm stable simple cyst. No hydronephrosis visualized. Left Kidney: Length: 11.1 cm. Cortical thinning. Increased echogenicity. No mass or hydronephrosis visualized. Abdominal aorta: No aneurysm visualized. Other findings: None. IMPRESSION: 1. Increased renal echogenicity consistent with chronic renal disease. Bilateral renal atrophy again noted. No acute renal abnormality. No hydronephrosis. 2.  No gallstones or biliary distention. Electronically Signed   By: Marcello Moores  Register   On: 03/31/2017 11:14   Dg Abd Portable 1v  Result Date: 03/31/2017 CLINICAL DATA:  Abdominal pain and distention. EXAM: PORTABLE ABDOMEN - 1 VIEW COMPARISON:  CT abdomen and pelvis 12/12/2012. FINDINGS: Image  quality is degraded by patient body habitus. The far left lateral aspect of the abdomen was incompletely imaged. No gross intraperitoneal free air is identified on this supine study. Gas and a small amount of stool are present in the colon. No dilated loops of bowel are seen to suggest obstruction. 3 cm oval calcification in the right pelvis corresponds to a chronic peripherally calcified right ovarian lesion, grossly similar in size to the prior CT. Asymmetric right hip osteoarthrosis is noted, and there is diffuse thoracolumbar spondylosis. Asymmetric left basilar lung opacity is more fully evaluated on today's earlier chest radiographs. IMPRESSION: Nonobstructed bowel gas pattern. Electronically Signed   By: Logan Bores M.D.   On: 03/31/2017 07:26    STUDIES:  KUB 6/22 > normal Abdominal ultrasound 6/22 >> no evidence of cholecystitis, no renal stones or evidence for renal obstruction CT head 6/22 > no acute abnormality  CULTURES: Blood 6/21 > E coli >>  Urine 6/21 >  ANTIBIOTICS: Zosyn 6/21 > Vancomycin 6/21 > 6/22  SIGNIFICANT EVENTS: 6/21 admit  LINES/TUBES:  DISCUSSION:   ASSESSMENT / PLAN:  PULMONARY A: Acute hypercarbic/hypoxemic  respiratory failure: possibly narcotic related.  Pulmonary edema ? PE: Less likely now that we have an alternative explanation for decompensation and her pain is more localized L chest pain > more left flank pain OSA not on CPAP  P:   Tolerating BiPAP, continue for now daily at bedtime and when necessary Check ABG now CT scan of the chest was deferred after she was able to localize her pain to the left flank, left abdomen Continue Eliquis for DVT, may need transition to heparin if she is unable to tolerate by mouth Hold off further diuresis for now  CARDIOVASCULAR A:  Hypertensive urgency > improved likely pain related while on BiPAP  P:  Cardiac enzymes reassuring Blood pressure control with hydralazine as needed  RENAL A:   AKI  on CKD, worse 6/23  P:   Follow BMP, urine output with volume resuscitation No evidence of ureteral obstruction, stones on abd [place] 6/22  GASTROINTESTINAL A:   Abd pain, left flank pain GERD  P:   CT abdomen deferred, abdominal ultrasound reassuring as above Nothing by mouth PPI  HEMATOLOGIC A:   Mild anemia DVT  P:  Follow CBC Continue Eliquis as above  INFECTIOUS A:   Escherichia coli bacteremia, sensitivities pending Urinary tract infection  P:   Vancomycin discontinued 6/22 Continue Zosyn pending Escherichia coli sensitivities, then narrow  ENDOCRINE A:   DM  P:   Sliding scale insulin, CBGs NPH insulin discontinued until she is able to take good diet  NEUROLOGIC A:   Acute metabolic encephalopathy, improved but not back to baseline  P:   RASS goal: 0 Minimize sedating medications Correct metabolic disarray Treat underlying sepsis  Musculoskeletal A:   L fibula fracture L navicular fracture  P:   Mointor Splint in place   FAMILY  - Updates:   - Inter-disciplinary family meet or Palliative Care meeting due by:  6/28  Independent critical care time is 33 minutes.   Baltazar Apo, MD, PhD 04/01/2017, 6:14 AM [place] [facility] [phone] or if no answer [phone]

## 2017-04-01 NOTE — Progress Notes (Signed)
Pt taken off per pt request. Pt complaining that her face hurts. Pt placed on 40% venti mask. Tolerating well at this time. Johnny RN aware.

## 2017-04-01 NOTE — Progress Notes (Signed)
RT NOTE:  Pt switched to NRB from BIPAP. Pt complaining of stomach pain. RN aware. BIPAP @ bedside if needed.

## 2017-04-01 NOTE — Progress Notes (Addendum)
PROGRESS NOTE                                                                                                                                                                                                             Patient Demographics:    Jean Mcclain, is a 71 y.o. female, DOB - [birth year], VWU:[id number]  Admit date - 03/30/2017   Admitting Physician Elwin Mocha, MD  Outpatient Primary MD for the patient is Leanna Battles, MD  LOS - 2  Chief Complaint  Patient presents with  . Fall       Brief Narrative    71 y.o. female  with past medical history of diabetes, reflux, sleep apnea, osteopenia presented to the emergency room after fall downstairs, workup significant for sepsis secondary to pyelonephritis/bacteremia.   Subjective:    [name] today Report is feeling much better today, but still complaining of left-sided chest/flank musculoskeletal pain denies any nausea, vomiting cough or shortness of breath   Assessment  & Plan :    Principal Problem:   Sepsis ([place]) Active Problems:   Insulin-requiring or dependent type II diabetes mellitus ([place])   Essential hypertension   AKI (acute kidney injury) ([place])   Acute respiratory distress   Acute on chronic diastolic CHF (congestive heart failure) (H[facility])   Sepsis secondary to Escherichia coli pyelonephritis/bacteremia - Patient presents with fever, tachycardia, tachypnea, leukocytosis, and encephalopathy with worsening renal function - Blood culture growing Escherichia coli, patient with positive urinalysis, follow on urine cultures -  reconstitution trending down, lactic acid within normal limits  - Will DC IV vancomycin, continue with dosing, and oral antibiotics for that according to sensitivities which still pending .  Acute hypoxic/hypercarbic respiratory failure - This is most likely related to sepsis, and some volume overload, continue with BiPAP, PCCM input greatly  appreciated,was on BiPAP yesterday, but currently appears to be improving, under percent on facemask.  Elevated troponin - in The setting of demand ischemia secondary to sepsis and respiratory failure, she denies any chest pain,  troponin trend is not non-ACS pattern, recent echo on 6/2, with a preserved EF, no regional wall motion abnormalities.  DVT - Continue with Eliquis  Acute encephalopathy - Metabolic secondary to infection and hypercapnia . 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 - Secondary to sepsis, baseline creatinine 1.8,  2.3 on admission, worsening to 2.6 today, this is most likely due to diuresis, Lasix is on hold   Gout - Continue allopurinol, colchicine  Diabetes - Continue with insulin sliding scale  GERD - Cont PPI  Hyperlipidemia - Continue statin  RLS - Cont requip bid  Code Status :Full  Family Communication  : None  at bedside  Disposition Plan  : Pending further workup  Consults  :  PCCM  Procedures  : None  DVT Prophylaxis  :  Eliquis  Lab Results  Component Value Date   PLT 247 04/01/2017    Antibiotics  :    Anti-infectives    Start     Dose/Rate Route Frequency Ordered Stop   04/01/17 1300  vancomycin (VANCOCIN) 1,500 mg in sodium chloride 0.9 % 500 mL IVPB  Status:  Discontinued     1,500 mg 250 mL/hr over 120 Minutes Intravenous Every 48 hours 03/30/17 1216 03/31/17 0852   03/30/17 2100  piperacillin-tazobactam (ZOSYN) IVPB 3.375 g     3.375 g 12.5 mL/hr over 240 Minutes Intravenous Every 8 hours 03/30/17 1216     03/30/17 1230  vancomycin (VANCOCIN) 2,000 mg in sodium chloride 0.9 % 500 mL IVPB     2,000 mg 250 mL/hr over 120 Minutes  Intravenous  Once 03/30/17 1202 03/30/17 1535   03/30/17 1200  piperacillin-tazobactam (ZOSYN) IVPB 3.375 g     3.375 g 100 mL/hr over 30 Minutes Intravenous  Once 03/30/17 1155 03/30/17 1427   03/30/17 1200  vancomycin (VANCOCIN) IVPB 1000 mg/200 mL premix  Status:  Discontinued     1,000 mg 200 mL/hr over 60 Minutes Intravenous  Once 03/30/17 1155 03/30/17 1202        Objective:   Vitals:   04/01/17 0753 04/01/17 0758 04/01/17 0800 04/01/17 0900  BP:   (!) 148/78 (!) 136/52  Pulse:   84 86  Resp:   (!) 22 (!) 24  Temp:  97.8 F (36.6 C)    TempSrc:  Axillary    SpO2: 95%  100% 100%  Weight:      Height:        Wt Readings from Last 3 Encounters:  03/30/17 128.4 kg (283 lb)  03/13/17 113.4 kg (250 lb)  03/12/17 112.9 kg (248 lb 12.8 oz)     Intake/Output Summary (Last 24 hours) at 04/01/17 1055 Last data filed at 04/01/17 0900  Gross per 24 hour  Intake              410 ml  Output             1850 ml  Net            -1440 ml     Physical Exam  Awake alert oriented 3  Supple neck, no JVD  Good entry bilaterally, clear to auscultation, negative accessory muscle  Regular rate and rhythm, no rubs, gallops  Abdomen soft, nontender, nondistended, bowel sounds presents, has some musculoskeletal left flank tenderness to palpation  Ext with no edema, clubbing or cyanosis     Data Review:    CBC  Recent Labs Lab 03/30/17 1128 03/31/17 0756 04/01/17 0207  WBC 14.1* 14.4* 10.4  HGB  11.0* 10.9* 9.7*  HCT 35.5* 36.5 32.7*  PLT 227 249 247  MCV 95.2 97.9 98.2  MCH 29.5 29.2 29.1  MCHC 31.0 29.9* 29.7*  RDW 15.9* 15.9* 15.9*  LYMPHSABS 1.0  --   --   MONOABS 1.1*  --   --   EOSABS 0.0  --   --   BASOSABS 0.0  --   --     Chemistries   Recent Labs Lab 03/30/17 1128 03/31/17 0756 03/31/17 1849 04/01/17 0207  NA 134* 139 143 143  K 4.8 4.9 4.8 4.5  CL 101 103 104 104  CO2 23 29 32 28  GLUCOSE 282* 241* 142* 132*  BUN 31* 32* 35* 39*  CREATININE  2.31* 2.36* 2.51* 2.66*  CALCIUM 8.1* 8.0* 8.1* 8.0*  MG 1.8  --   --  2.2  AST 24  --   --   --   ALT 23  --   --   --   ALKPHOS 97  --   --   --   BILITOT 0.7  --   --   --    ------------------------------------------------------------------------------------------------------------------ No results for input(s): CHOL, HDL, LDLCALC, TRIG, CHOLHDL, LDLDIRECT in the last 72 hours.  Lab Results  Component Value Date   HGBA1C 7.1 (H) 03/11/2017   ------------------------------------------------------------------------------------------------------------------ No results for input(s): TSH, T4TOTAL, T3FREE, THYROIDAB in the last 72 hours.  Invalid input(s): FREET3 ------------------------------------------------------------------------------------------------------------------ No results for input(s): VITAMINB12, FOLATE, FERRITIN, TIBC, IRON, RETICCTPCT in the last 72 hours.  Coagulation profile  Recent Labs Lab 03/30/17 1128  INR 1.65    No results for input(s): DDIMER in the last 72 hours.  Cardiac Enzymes  Recent Labs Lab 03/31/17 0756 03/31/17 1234 03/31/17 1849  TROPONINI 0.18* 0.05* 0.03*   ------------------------------------------------------------------------------------------------------------------ No results found for: BNP  Inpatient Medications  Scheduled Meds: . apixaban  5 mg Oral BID  . colchicine  0.6 mg Oral Daily  . insulin aspart  2-6 Units Subcutaneous Q4H  . pantoprazole  40 mg Oral Daily  . pravastatin  40 mg Oral q1800  . rOPINIRole  4 mg Oral BID  . senna-docusate  2 tablet Oral BID   Continuous Infusions: . piperacillin-tazobactam (ZOSYN)  IV Stopped (04/01/17 0808)   PRN Meds:.acetaminophen **OR** acetaminophen, albuterol, hydrALAZINE, morphine injection, naloxone, ondansetron **OR** ondansetron (ZOFRAN) IV  Micro Results Recent Results (from the past 240 hour(s))  Culture, blood (Routine x 2)     Status: Abnormal (Preliminary  result)   Collection Time: 03/30/17 12:28 PM  Result Value Ref Range Status   Specimen Description BLOOD RIGHT FOREARM  Final   Special Requests   Final    BOTTLES DRAWN AEROBIC AND ANAEROBIC Blood Culture adequate volume   Culture  Setup Time   Final    GRAM NEGATIVE RODS ANAEROBIC BOTTLE ONLY CRITICAL RESULT CALLED TO, READ BACK BY AND VERIFIED WITH: PHARMD T STONE [id number] MLM    Culture ESCHERICHIA COLI SUSCEPTIBILITIES TO FOLLOW  (A)  Final   Report Status PENDING  Incomplete  Blood Culture ID Panel (Reflexed)     Status: Abnormal   Collection Time: 03/30/17 12:28 PM  Result Value Ref Range Status   Enterococcus species NOT DETECTED NOT DETECTED Final   Listeria monocytogenes NOT DETECTED NOT DETECTED Final   Staphylococcus species NOT DETECTED NOT DETECTED Final   Staphylococcus aureus NOT DETECTED NOT DETECTED Final   Streptococcus species NOT DETECTED NOT DETECTED Final   Streptococcus agalactiae NOT DETECTED NOT DETECTED Final  Streptococcus pneumoniae NOT DETECTED NOT DETECTED Final   Streptococcus pyogenes NOT DETECTED NOT DETECTED Final   Acinetobacter baumannii NOT DETECTED NOT DETECTED Final   Enterobacteriaceae species DETECTED (A) NOT DETECTED Final    Comment: Enterobacteriaceae represent a large family of gram-negative bacteria, not a single organism. CRITICAL RESULT CALLED TO, READ BACK BY AND VERIFIED WITH: PHARMD T STONE [id number] MLM    Enterobacter cloacae complex NOT DETECTED NOT DETECTED Final   Escherichia coli DETECTED (A) NOT DETECTED Final    Comment: CRITICAL RESULT CALLED TO, READ BACK BY AND VERIFIED WITH: PHARMD T STONE [id number] MLM    Klebsiella oxytoca NOT DETECTED NOT DETECTED Final   Klebsiella pneumoniae NOT DETECTED NOT DETECTED Final   Proteus species NOT DETECTED NOT DETECTED Final   Serratia marcescens NOT DETECTED NOT DETECTED Final   Carbapenem resistance NOT DETECTED NOT DETECTED Final   Haemophilus influenzae NOT  DETECTED NOT DETECTED Final   Neisseria meningitidis NOT DETECTED NOT DETECTED Final   Pseudomonas aeruginosa NOT DETECTED NOT DETECTED Final   Candida albicans NOT DETECTED NOT DETECTED Final   Candida glabrata NOT DETECTED NOT DETECTED Final   Candida krusei NOT DETECTED NOT DETECTED Final   Candida parapsilosis NOT DETECTED NOT DETECTED Final   Candida tropicalis NOT DETECTED NOT DETECTED Final  Culture, blood (Routine x 2)     Status: None (Preliminary result)   Collection Time: 03/30/17  1:11 PM  Result Value Ref Range Status   Specimen Description BLOOD RIGHT HAND  Final   Special Requests   Final    BOTTLES DRAWN AEROBIC AND ANAEROBIC Blood Culture adequate volume   Culture NO GROWTH 2 DAYS  Final   Report Status PENDING  Incomplete  MRSA PCR Screening     Status: None   Collection Time: 03/31/17  5:32 AM  Result Value Ref Range Status   MRSA by PCR NEGATIVE NEGATIVE Final    Comment:        The GeneXpert MRSA Assay (FDA approved for NASAL specimens only), is one component of a comprehensive MRSA colonization surveillance program. It is not intended to diagnose MRSA infection nor to guide or monitor treatment for MRSA infections.     Radiology Reports Dg Chest 2 View  Result Date: 03/30/2017 CLINICAL DATA:  Shortness of breath since falling yesterday, LEFT arm pain, hypertension, diabetes mellitus, COPD, former smoker EXAM: CHEST  2 VIEW COMPARISON:  03/2017 FINDINGS: Enlargement of cardiac silhouette. Mediastinal contours and pulmonary vascularity normal. RIGHT basilar atelectasis. Underpenetration of LEFT lung base. Remaining lungs clear. Rotated to the LEFT. Central peribronchial thickening. No gross pleural effusion or pneumothorax. Bones demineralized. IMPRESSION: Enlargement of cardiac silhouette. Bronchitic changes with RIGHT basilar atelectasis. Electronically Signed   By: Lavonia Dana M.D.   On: 03/30/2017 13:01   Dg Chest 2 View  Result Date: 03/11/2017 CLINICAL  DATA:  Acute onset of wheezing. Known DVT. Initial encounter. EXAM: CHEST  2 VIEW COMPARISON:  Chest radiograph performed 10/01/2009 FINDINGS: The lungs are well-aerated. Pulmonary vascularity is at the upper limits of normal. There is no evidence of focal opacification, pleural effusion or pneumothorax. The heart is normal in size; the mediastinal contour is within normal limits. No acute osseous abnormalities are seen. Anterior bridging osteophytes are noted along the lower thoracic spine. IMPRESSION: No acute cardiopulmonary process seen. Electronically Signed   By: Garald Balding M.D.   On: 03/11/2017 01:03   Dg Ankle 2 Views Left  Result Date: 03/02/2017 Please see detailed  radiograph report in office note.  Ct Head Wo Contrast  Result Date: 03/31/2017 CLINICAL DATA:  Altered mental status. EXAM: CT HEAD WITHOUT CONTRAST TECHNIQUE: Contiguous axial images were obtained from the base of the skull through the vertex without intravenous contrast. COMPARISON:  CT scan of October 01, 2009. FINDINGS: Brain: Mild chronic ischemic white matter disease is noted. No mass effect or midline shift is noted. Ventricular size is within normal limits. There is no evidence of mass lesion, hemorrhage or acute infarction. Vascular: Atherosclerosis of carotid siphons is noted. Skull: Normal. Negative for fracture or focal lesion. Sinuses/Orbits: No acute finding. Other: None. IMPRESSION: Mild chronic ischemic white matter disease. No acute intracranial abnormality seen. Electronically Signed   By: Marijo Conception, M.D.   On: 03/31/2017 09:11   US Abdomen Complete  Result Date: 03/31/2017 CLINICAL DATA:  Left flank pain.  Generalized abdominal pain. EXAM: ABDOMEN ULTRASOUND COMPLETE COMPARISON:  Abdomen 03/31/2017. Ultrasound 09/11/2015. CT 12/12/2012. CT 04/07/2010 FINDINGS: Gallbladder: No gallstones or wall thickening visualized. No sonographic Murphy sign noted by sonographer. Common bile duct: Diameter: 4.1 mm  Liver: No focal lesion identified. Within normal limits in parenchymal echogenicity. IVC: No abnormality visualized. Pancreas: Visualized portion unremarkable. Spleen: Size and appearance within normal limits. Right Kidney: Length: 10.7 cm. Cortical thinning . Increased echogenicity. 1.1 cm stable simple cyst. No hydronephrosis visualized. Left Kidney: Length: 11.1 cm. Cortical thinning. Increased echogenicity. No mass or hydronephrosis visualized. Abdominal aorta: No aneurysm visualized. Other findings: None. IMPRESSION: 1. Increased renal echogenicity consistent with chronic renal disease. Bilateral renal atrophy again noted. No acute renal abnormality. No hydronephrosis. 2.  No gallstones or biliary distention. Electronically Signed   By: Marcello Moores  Register   On: 03/31/2017 11:14   Dg Chest Port 1 View  Result Date: 03/31/2017 CLINICAL DATA:  Initial evaluation for acute respiratory distress. EXAM: PORTABLE CHEST 1 VIEW COMPARISON:  Prior radiograph from 03/30/2017. FINDINGS: Study limited by patient positioning. Grossly stable cardiomegaly. Mediastinal silhouette within normal limits. Lungs hypoinflated. Increased pulmonary vascularity with interstitial prominence, suggestive of congestion without frank pulmonary edema. Patchy bibasilar opacities favored to reflect atelectasis/ bronchovascular crowding and/ or congestion. Superimposed infiltrates would be difficult to exclude. Suspected left pleural effusion. No pneumothorax. No acute osseus abnormality. Remote right-sided rib fractures noted. IMPRESSION: 1. Stable cardiomegaly with increased pulmonary vascular congestion and interstitial prominence as compared to previous, consistent with pulmonary vascular congestion. No frank pulmonary edema. 2. Suspected left pleural effusion. 3. Shallow lung inflation. Patchy bibasilar opacities favored to reflect atelectasis/bronchovascular crowding and/or edema. Superimposed infiltrates would be difficult to exclude,  and could be considered in the correct clinical setting. Electronically Signed   By: Jeannine Boga M.D.   On: 03/31/2017 04:45   Dg Abd Portable 1v  Result Date: 03/31/2017 CLINICAL DATA:  Abdominal pain and distention. EXAM: PORTABLE ABDOMEN - 1 VIEW COMPARISON:  CT abdomen and pelvis 12/12/2012. FINDINGS: Image quality is degraded by patient body habitus. The far left lateral aspect of the abdomen was incompletely imaged. No gross intraperitoneal free air is identified on this supine study. Gas and a small amount of stool are present in the colon. No dilated loops of bowel are seen to suggest obstruction. 3 cm oval calcification in the right pelvis corresponds to a chronic peripherally calcified right ovarian lesion, grossly similar in size to the prior CT. Asymmetric right hip osteoarthrosis is noted, and there is diffuse thoracolumbar spondylosis. Asymmetric left basilar lung opacity is more fully evaluated on today's earlier chest radiographs. IMPRESSION:  Nonobstructed bowel gas pattern. Electronically Signed   By: Logan Bores M.D.   On: 03/31/2017 07:26   Dg Foot Complete Left  Result Date: 03/02/2017 Please see detailed radiograph report in office note.    [facility], Takeisha Cianci M.D on 04/01/2017 at 10:55 AM  Between 7am to 7pm - Pager - [phone]  After 7pm go to [email or web address] - password [facility]  [facility] -  Office  [phone]

## 2017-04-02 DIAGNOSIS — A419 Sepsis, unspecified organism: Secondary | ICD-10-CM

## 2017-04-02 DIAGNOSIS — S2242XD Multiple fractures of ribs, left side, subsequent encounter for fracture with routine healing: Secondary | ICD-10-CM

## 2017-04-02 DIAGNOSIS — I5033 Acute on chronic diastolic (congestive) heart failure: Secondary | ICD-10-CM

## 2017-04-02 LAB — URINE CULTURE: Culture: NO GROWTH

## 2017-04-02 LAB — BASIC METABOLIC PANEL
Anion gap: 8 (ref 5–15)
BUN: 36 mg/dL — ABNORMAL HIGH (ref 6–20)
CO2: 30 mmol/L (ref 22–32)
Calcium: 8.5 mg/dL — ABNORMAL LOW (ref 8.9–10.3)
Chloride: 103 mmol/L (ref 101–111)
Creatinine, Ser: 2.18 mg/dL — ABNORMAL HIGH (ref 0.44–1.00)
GFR calc Af Amer: 25 mL/min — ABNORMAL LOW (ref >60)
GFR calc non Af Amer: 22 mL/min — ABNORMAL LOW (ref >60)
Glucose, Bld: 126 mg/dL — ABNORMAL HIGH (ref 65–99)
Potassium: 4.4 mmol/L (ref 3.5–5.1)
Sodium: 141 mmol/L (ref 135–145)

## 2017-04-02 LAB — CULTURE, BLOOD (ROUTINE X 2): Special Requests: ADEQUATE

## 2017-04-02 LAB — CBC
HCT: 32.4 % — ABNORMAL LOW (ref 36.0–46.0)
Hemoglobin: 9.7 g/dL — ABNORMAL LOW (ref 12.0–15.0)
MCH: 29 pg (ref 26.0–34.0)
MCHC: 29.9 g/dL — ABNORMAL LOW (ref 30.0–36.0)
MCV: 96.7 fL (ref 78.0–100.0)
Platelets: 253 10*3/uL (ref 150–400)
RBC: 3.35 MIL/uL — ABNORMAL LOW (ref 3.87–5.11)
RDW: 15.5 % (ref 11.5–15.5)
WBC: 10.5 10*3/uL (ref 4.0–10.5)

## 2017-04-02 LAB — PROCALCITONIN: Procalcitonin: 0.57 ng/mL

## 2017-04-02 LAB — GLUCOSE, CAPILLARY
Glucose-Capillary: 100 mg/dL — ABNORMAL HIGH (ref 65–99)
Glucose-Capillary: 132 mg/dL — ABNORMAL HIGH (ref 65–99)
Glucose-Capillary: 133 mg/dL — ABNORMAL HIGH (ref 65–99)
Glucose-Capillary: 292 mg/dL — ABNORMAL HIGH (ref 65–99)
Glucose-Capillary: 306 mg/dL — ABNORMAL HIGH (ref 65–99)
Glucose-Capillary: 89 mg/dL (ref 65–99)

## 2017-04-02 LAB — MAGNESIUM: Magnesium: 2.2 mg/dL (ref 1.7–2.4)

## 2017-04-02 MED ORDER — DEXTROSE 5 % IV SOLN
2.0000 g | INTRAVENOUS | Status: DC
  Administered 2017-04-02 – 2017-04-06 (×5): 2 g via INTRAVENOUS
  Filled 2017-04-02 (×7): qty 2

## 2017-04-02 MED ORDER — INSULIN ASPART 100 UNIT/ML ~~LOC~~ SOLN
0.0000 [IU] | Freq: Three times a day (TID) | SUBCUTANEOUS | Status: DC
  Administered 2017-04-02: 1 [IU] via SUBCUTANEOUS
  Administered 2017-04-03: 3 [IU] via SUBCUTANEOUS
  Administered 2017-04-03: 2 [IU] via SUBCUTANEOUS
  Administered 2017-04-03 – 2017-04-04 (×2): 3 [IU] via SUBCUTANEOUS
  Administered 2017-04-04: 8 [IU] via SUBCUTANEOUS
  Administered 2017-04-04: 2 [IU] via SUBCUTANEOUS
  Administered 2017-04-05 – 2017-04-06 (×4): 5 [IU] via SUBCUTANEOUS
  Administered 2017-04-06: 7 [IU] via SUBCUTANEOUS
  Administered 2017-04-07: 5 [IU] via SUBCUTANEOUS
  Administered 2017-04-07: 7 [IU] via SUBCUTANEOUS
  Administered 2017-04-07: 5 [IU] via SUBCUTANEOUS

## 2017-04-02 MED ORDER — INSULIN GLARGINE 100 UNIT/ML ~~LOC~~ SOLN
20.0000 [IU] | Freq: Every day | SUBCUTANEOUS | Status: DC
  Administered 2017-04-02 – 2017-04-07 (×6): 20 [IU] via SUBCUTANEOUS
  Filled 2017-04-02 (×7): qty 0.2

## 2017-04-02 MED ORDER — OXYCODONE HCL 5 MG PO TABS
5.0000 mg | ORAL_TABLET | ORAL | Status: DC | PRN
  Administered 2017-04-02 – 2017-04-07 (×17): 5 mg via ORAL
  Filled 2017-04-02 (×17): qty 1

## 2017-04-02 NOTE — Evaluation (Signed)
Physical Therapy Evaluation Patient Details Name: Jean Mcclain MRN: [id number] DOB: 1946/02/26 Today's Date: 04/02/2017   History of Present Illness   Patient is a 71 yo female admitted 03/30/17 after fall downstairs, she sustained multiple rib fractures, as well workup significant for sepsis secondary to pyelonephritis/bacteremia.past medical history of diabetes, reflux, sleep apnea, osteopenia.  Clinical Impression  Patient presents with problems listed below.  Will benefit from acute PT to maximize functional mobility prior to d/c.  Patient was independent with RW pta, living alone.  Today, patient required +2 Total assist to move to sitting, and unable to stand.  Pain level impacting today's session.   Recommend SNF at d/c for continued therapy for mobility and gait.    Follow Up Recommendations SNF;Supervision/Assistance - 24 hour    Equipment Recommendations  Wheelchair (measurements PT);Wheelchair cushion (measurements PT)    Recommendations for Other Services       Precautions / Restrictions Precautions Precautions: Fall Precaution Comments: Patient with increased pain with any movement, yelling out. Restrictions Weight Bearing Restrictions: No      Mobility  Bed Mobility Overal bed mobility: Needs Assistance Bed Mobility: Supine to Sit;Sit to Supine     Supine to sit: Total assist;+2 for physical assistance Sit to supine: Total assist;+2 for physical assistance   General bed mobility comments: Verbal cues for technique.  Patient began breathing quickly, fearful of moving.  Patient attempted to move LE's to EOB.  Required total assist to move LE's and hips to EOB, and to raise trunk to upright sitting position.  Patient required increased time due to pain.  Patient returned to supine with +2 total assist.  Patient yelling out during transition with increased RR.  Encouraged patient to slow breathing and relax.  Transfers Overall transfer level: Needs  assistance Equipment used: Rolling walker (2 wheeled) Transfers: Sit to/from Stand Sit to Stand: Max assist;+2 physical assistance         General transfer comment: Cues to scoot to EOB, requiring max assist.  Patient telling PT to move slowly.  Attempted to assist patient to standing x4.  Patient unable to move hips off of bed.  Ambulation/Gait             General Gait Details: Unable  Stairs            Wheelchair Mobility    Modified Rankin (Stroke Patients Only)       Balance Overall balance assessment: Needs assistance Sitting-balance support: No upper extremity supported;Feet supported Sitting balance-Leahy Scale: Fair                                       Pertinent Vitals/Pain Pain Assessment: 0-10 Pain Score: 10-Worst pain ever (With any mobility) Pain Location: Lt ribcage Pain Descriptors / Indicators: Grimacing;Guarding;Moaning;Sharp;Spasm;Throbbing (Yelling; Increasing RR and dyspnea) Pain Intervention(s): Limited activity within patient's tolerance;Monitored during session;Repositioned;Patient requesting pain meds-RN notified;RN gave pain meds during session    [place] expects to be discharged to:: Private residence Living Arrangements: Alone Available Help at Discharge: Family;Available PRN/intermittently Type of Home: House Home Access: Stairs to enter Entrance Stairs-Rails: Psychiatric nurse of Steps: 5 Home Layout: One level Home Equipment: Walker - 2 wheels;Shower seat;Cane - single point      Prior Function Level of Independence: Independent with assistive device(s)         Comments: Patient using RW for ambulation and cane on stairs.  Hand Dominance        Extremity/Trunk Assessment   Upper Extremity Assessment Upper Extremity Assessment: Overall WFL for tasks assessed    Lower Extremity Assessment Lower Extremity Assessment: RLE deficits/detail;LLE  deficits/detail RLE Deficits / Details: Strength grossly 2/5 RLE Coordination: decreased gross motor LLE Deficits / Details: Decreased strength to 2-/5 LLE Coordination: decreased gross motor       Communication   Communication: No difficulties  Cognition Arousal/Alertness: Awake/alert Behavior During Therapy: Anxious;Flat affect Overall Cognitive Status: Within Functional Limits for tasks assessed                                        General Comments      Exercises     Assessment/Plan    PT Assessment Patient needs continued PT services  PT Problem List Decreased strength;Decreased activity tolerance;Decreased balance;Decreased mobility;Decreased coordination;Decreased knowledge of use of DME;Cardiopulmonary status limiting activity;Obesity;Pain       PT Treatment Interventions DME instruction;Gait training;Functional mobility training;Therapeutic activities;Therapeutic exercise;Cognitive remediation    PT Goals (Current goals can be found in the Care Plan section)  Acute Rehab PT Goals Patient Stated Goal: Decrease pain PT Goal Formulation: With patient Time For Goal Achievement: 04/16/17 Potential to Achieve Goals: Fair    Frequency Min 3X/week   Barriers to discharge Inaccessible home environment;Decreased caregiver support Home has 5 steps to enter.  Patient lives alone    Co-evaluation               AM-PAC PT "6 Clicks" Daily Activity  Outcome Measure Difficulty turning over in bed (including adjusting bedclothes, sheets and blankets)?: Total Difficulty moving from lying on back to sitting on the side of the bed? : Total Difficulty sitting down on and standing up from a chair with arms (e.g., wheelchair, bedside commode, etc,.)?: Total Help needed moving to and from a bed to chair (including a wheelchair)?: Total Help needed walking in hospital room?: Total Help needed climbing 3-5 steps with a railing? : Total 6 Click Score: 6     End of Session Equipment Utilized During Treatment: Oxygen Activity Tolerance: Patient limited by pain (Limited by DOE  (RR increased into 30's. O2 sats in 90's) Patient left: in bed;with call bell/phone within reach;with nursing/sitter in room Nurse Communication: Mobility status;Need for lift equipment;Patient requests pain meds PT Visit Diagnosis: Difficulty in walking, not elsewhere classified (R26.2);Muscle weakness (generalized) (M62.81);Pain Pain - Right/Left: Left Pain - part of body:  (Rib cage)    Time: 8250-5397 PT Time Calculation (min) (ACUTE ONLY): 43 min   Charges:   PT Evaluation $PT Eval High Complexity: 1 Procedure PT Treatments $Therapeutic Activity: 23-37 mins   PT G Codes:        Carita Pian. Sanjuana Kava, [facility] Acute Rehab Services Pager [place] 04/02/2017, 9:57 PM

## 2017-04-02 NOTE — Progress Notes (Signed)
PULMONARY / CRITICAL CARE MEDICINE   Name: BRAYLEY MACKOWIAK MRN: [id number] DOB: 1946-06-04    ADMISSION DATE:  03/30/2017 CONSULTATION DATE:  6/22  REFERRING MD:  Dr. Waldron Labs  CHIEF COMPLAINT:  Resp distress  HISTORY OF PRESENT ILLNESS:  Patient is encephalopathic and/or intubated. Therefore history has been obtained from chart review.   71 year old female with PMH as below, which is significant for DM, OSA, HTD, HTN, and GERD. She recently suffered left fibular and navicular bone fracture with subsequent DVT. Surgery would be required for her fractures. She was admitted for DVT 03/11/2017 and treated with heparin and was transitioned to Eliquis at discharge 6/3.  She again presented to [facility] 6/21 with complaints of Left sided chest pain following a fall. She did describe dysuria and frequency in ED and was febrile. Reports chills over the past few days. Recently on ABX. She was admitted to the hospitalists for sepsis of unknown origin and treated with broad spectrum antibiotics. While on floor she was having terrible L sided chest pain and she was administered pain medication. She developed respiratory distress shortly after. She was given narcan with no response. ABG demonstrated resp acidosis with pH 7.18. She was started on BiPAP, given lasix, and moved to ICU. PCCM asked to see.   SUBJECTIVE:  Required BiPAP overnight but appears stable now on [place]  VITAL SIGNS: BP (!) 155/67   Pulse 85   Temp 98 F (36.7 C) (Oral)   Resp (!) 24   Ht 5' (1.524 m)   Wt 248 lb 7.3 oz (112.7 kg)   SpO2 100%   BMI 48.52 kg/m    HEMODYNAMICS:    VENTILATOR SETTINGS: FiO2 (%):  [40 %] 40 %  INTAKE / OUTPUT: I/O last 3 completed shifts: In: 1590 [P.O.:1340; IV Piggyback:250] Out: 2000 [Urine:2000]  PHYSICAL EXAMINATION: General:  Elderly female,  Obese. Calm Neuro:  Awake, alert. Moving all ext to command HEENT:  [place]/AT, PERRL, unable to appreciate JVD Cardiovascular:  Tachy, regular, no  MRG Lungs:  Difficult to assess due to moaning and upper airway wheeze. No stridor.  Abdomen:  Soft, generalized tenderness,  Musculoskeletal:  NO acute deformity or ROM limitation Skin:  Grossly intact  LABS:  BMET  Recent Labs Lab 04/01/17 0207 04/01/17 1520 04/02/17 0218  NA 143 138 141  K 4.5 4.5 4.4  CL 104 102 103  CO2 28 27 30   BUN 39* 39* 36*  CREATININE 2.66* 2.31* 2.18*  GLUCOSE 132* 169* 126*    Electrolytes  Recent Labs Lab 03/30/17 1128  04/01/17 0207 04/01/17 1520 04/02/17 0218  CALCIUM 8.1*  < > 8.0* 8.4* 8.5*  MG 1.8  --  2.2  --  2.2  PHOS 2.9  --   --   --   --   < > = values in this interval not displayed.  CBC  Recent Labs Lab 03/31/17 0756 04/01/17 0207 04/02/17 0218  WBC 14.4* 10.4 10.5  HGB 10.9* 9.7* 9.7*  HCT 36.5 32.7* 32.4*  PLT 249 247 253   Coag's  Recent Labs Lab 03/30/17 1128  INR 1.65   Sepsis Markers  Recent Labs Lab 03/30/17 1555 03/31/17 0755 03/31/17 0813 03/31/17 1234 04/01/17 0207 04/02/17 0218  LATICACIDVEN 0.72 0.7  --  0.7  --   --   PROCALCITON  --   --  1.14  --  1.02 0.57   ABG  Recent Labs Lab 03/31/17 0411 03/31/17 0749 03/31/17 1218  PHART  7.197* 7.280* 7.282*  PCO2ART 65.6* 62.8* 68.1*  PO2ART 203* 67.0* 168.0*   Liver Enzymes  Recent Labs Lab 03/30/17 1128  AST 24  ALT 23  ALKPHOS 97  BILITOT 0.7  ALBUMIN 2.9*   Cardiac Enzymes  Recent Labs Lab 03/31/17 0756 03/31/17 1234 03/31/17 1849  TROPONINI 0.18* 0.05* 0.03*   Glucose  Recent Labs Lab 04/01/17 1123 04/01/17 1648 04/01/17 1959 04/01/17 2358 04/02/17 0346 04/02/17 0751  GLUCAP 111* 160* 144* 306* 292* 89   Imaging Dg Ribs Unilateral Left  Result Date: 04/01/2017 CLINICAL DATA:  Recent fall, left chest pain EXAM: LEFT RIBS - 2 VIEW COMPARISON:  03/31/2017 FINDINGS: Multiple acute left rib fractures involving the third through eighth ribs laterally. These are best demonstrated on the AP oblique view.  Degenerative changes noted spine. Heart is mildly enlarged. Small left effusion and left basilar atelectasis/consolidation suspected. IMPRESSION: Acute left third through eighth rib fractures. No large pneumothorax Suspected small left effusion and left basilar atelectasis/consolidation Electronically Signed   By: Jerilynn Mages.  Shick M.D.   On: 04/01/2017 13:34   Dg Hand 2 View Left  Result Date: 04/01/2017 CLINICAL DATA:  Fall down to stairs with left hand pain. EXAM: LEFT HAND - 2 VIEW COMPARISON:  None. FINDINGS: Pulse oximeter obscures the fourth distal phalanx. Mild degenerative change over the radiocarpal joint and carpal bones as well as the third MCP joint. No acute fracture or dislocation. IMPRESSION: No acute findings. Electronically Signed   By: Marin Olp M.D.   On: 04/01/2017 13:37   Dg Shoulder Left  Result Date: 04/01/2017 CLINICAL DATA:  Fall down to stairs with left shoulder pain. EXAM: LEFT SHOULDER - 2+ VIEW COMPARISON:  Chest x-ray 03/30/2017 FINDINGS: There are degenerative changes of the [facility] joint and glenohumeral joints. There is no acute fracture or dislocation over the shoulder girdle. Evidence of fracture of the left lateral third through sixth ribs. Degenerate change of the spine. IMPRESSION: Degenerative change of the shoulder without acute injury. Acute fractures of the left lateral third through sixth ribs. Electronically Signed   By: Marin Olp M.D.   On: 04/01/2017 13:39   Dg Hip Unilat With Pelvis 2-3 Views Left  Result Date: 04/01/2017 CLINICAL DATA:  Fall down 2 stairs with left hip pain. EXAM: DG HIP (WITH OR WITHOUT PELVIS) 2-3V LEFT COMPARISON:  03/31/2017 as well as CT 12/12/2012 FINDINGS: There is diffuse decreased bone mineralization. 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 No acute fracture or dislocation. Degenerative change of the spine. Oval rim calcified right adnexal structure unchanged. 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 Electronically Signed   By: Marin Olp M.D.   On: 04/01/2017 13:35    STUDIES:  KUB 6/22 > normal Abdominal ultrasound 6/22 >> no evidence of cholecystitis, no renal stones or evidence for renal obstruction CT head 6/22 > no acute abnormality  CULTURES: Blood 6/21 > E coli >>  Urine 6/21 >  ANTIBIOTICS: Zosyn 6/21 > Vancomycin 6/21 > 6/22  SIGNIFICANT EVENTS: 6/21 admit  LINES/TUBES:  I reviewed CXR myself, pulmonary edema noted.  DISCUSSION:   ASSESSMENT / PLAN:  PULMONARY A: Acute hypercarbic/hypoxemic respiratory failure: possibly narcotic related.  Pulmonary edema ? PE: Less likely now that we have an alternative explanation for decompensation and her pain is more localized L chest pain > more left flank pain OSA not on CPAP  P:   Change  BiPAP to PRN D/C further ABG checks Continue Eliquis for DVT, may need transition to heparin if she is unable to tolerate by mouth D/C lasix for now  CARDIOVASCULAR A:  Hypertensive urgency > improved likely pain related while on BiPAP  P:  Cardiac enzymes reassuring Blood pressure control with hydralazine as needed  RENAL A:   AKI on CKD, worse 6/23  P:   Follow BMP, urine output with volume resuscitation Replace electrolytes as indicated KVO IVF Hold lasix  GASTROINTESTINAL A:   Abd pain, left flank pain GERD  P:   CT abdomen deferred, abdominal ultrasound reassuring as above PPI  HEMATOLOGIC A:   Mild anemia DVT  P:  Follow CBC Continue Eliquis as above  INFECTIOUS A:   Escherichia coli bacteremia, sensitivities pending Urinary tract infection  P:   Vancomycin discontinued 6/22 D/C zosyn, start rocephin (e coli is sensitive)  ENDOCRINE A:   DM  P:   Sliding scale insulin, CBGs NPH insulin discontinued until she is able to take good diet  NEUROLOGIC A:   Acute metabolic encephalopathy, improved but not back to  baseline  P:   RASS goal: 0 Minimize sedating medications Correct metabolic disarray Treat underlying sepsis  Musculoskeletal A:   L fibula fracture L navicular fracture  P:   Mointor Splint in place  FAMILY  - Updates: Patient updated bedside  Discussed with PCCM-NP and bedside RN.  PCCM signing off, please call back if needed.  - Inter-disciplinary family meet or Palliative Care meeting due by:  6/28  Rush Farmer, M.D. [facility] [place] [facility]monary/Critical Care Medicine. Pager: [phone]. After hours pager: [phone]

## 2017-04-02 NOTE — Progress Notes (Signed)
PROGRESS NOTE                                                                                                                                                                                                             Patient Demographics:    Jean Mcclain, is a 71 y.o. female, DOB - August 11, 1946, JOI:[id number]  Admit date - 03/30/2017   Admitting Physician Elwin Mocha, MD  Outpatient Primary MD for the patient is Leanna Battles, MD  LOS - 3  Chief Complaint  Patient presents with  . Fall       Brief Narrative    71 y.o. female  with past medical history of diabetes, reflux, sleep apnea, osteopenia presented to the emergency room after fall downstairs, she sustained multiple rib fractures, as well workup significant for sepsis secondary to pyelonephritis/bacteremia.   Subjective:    Jean Mcclain today Report She continues to feel better, still complaining of left side musculoskeletal chest pain, denies any cough, productive sputum, no nausea, no vomiting .  Assessment  & Plan :    Principal Problem:   Sepsis ([place]) Active Problems:   Insulin-requiring or dependent type II diabetes mellitus ([place])   Essential hypertension   AKI (acute kidney injury) ([place])   Acute respiratory distress   Acute on chronic diastolic CHF (congestive heart failure) ([place])   Sepsis secondary to Escherichia coli pyelonephritis/bacteremia - Patient presents with fever, tachycardia, tachypnea, leukocytosis, and encephalopathy with worsening renal function - Blood culture growing Escherichia coli, patient with positive urinalysis, follow on urine cultures - Propulsid and trending down, lactic acid within normal limits. - IV vancomycin stopped 6/23, she was continued on Zosyn, Escherichia coli sensitive to Rocephin, will change to 2 g IV Rocephin from 6/24.   Acute hypoxic/hypercarbic respiratory failure - This is most likely related to sepsis, and some volume  overload, BiPAP management per PCCM, still on BiPAP daily at bedtime, tolerating nasal cannula during the daytime, will wean as tolerated.  Elevated troponin - in The setting of demand ischemia secondary to sepsis and respiratory failure, she denies any chest pain,  troponin trend is not non-ACS pattern, recent echo on 6/2, with a preserved EF, no regional wall motion abnormalities.  DVT - Continue with Eliquis  Acute encephalopathy - Metabolic secondary to infection and hypercapnia . CT head with no acute  findings - resolved, mentation back to baseline  Multiple rib fractures secondary to Recent fall  - Chest x-ray significant for rib fracture from rib 3 through 8, encouraged to use incentive spirometry, continue with when necessary pain medication, ambulate.  Acute on chronic diastolic CHF - Currently appears to be euvolemic, continue to hold diuresis given worsening renal function .  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 - Secondary to sepsis, baseline creatinine 1.8, peaked at 2.6, today is 2.1, continue to hold diuresis  Gout - Continue allopurinol, colchicine  Diabetes - CBGs are uncontrolled, she is on insulin NPH at home, and is poorly controlled on low-dose Lantus, will increase dose to 20 units today, a change to insulin sliding scale before meals   GERD - Cont PPI  Hyperlipidemia - Continue statin  RLS - Cont requip bid  Code Status :Full  Family Communication  : None  at bedside  Disposition Plan  : Pending further workup, PT consulted, likely will need SNF placement  Consults  :  PCCM  Procedures  : None  DVT Prophylaxis  :  Eliquis  Lab Results  Component Value Date   PLT 253 04/02/2017    Antibiotics  :    Anti-infectives    Start     Dose/Rate Route Frequency Ordered Stop   04/01/17 1300  vancomycin (VANCOCIN) 1,500 mg in sodium chloride 0.9 % 500 mL IVPB  Status:  Discontinued     1,500 mg 250 mL/hr over 120 Minutes Intravenous Every 48 hours 03/30/17  1216 03/31/17 0852   03/30/17 2100  piperacillin-tazobactam (ZOSYN) IVPB 3.375 g     3.375 g 12.5 mL/hr over 240 Minutes Intravenous Every 8 hours 03/30/17 1216     03/30/17 1230  vancomycin (VANCOCIN) 2,000 mg in sodium chloride 0.9 % 500 mL IVPB     2,000 mg 250 mL/hr over 120 Minutes Intravenous  Once 03/30/17 1202 03/30/17 1535   03/30/17 1200  piperacillin-tazobactam (ZOSYN) IVPB 3.375 g     3.375 g 100 mL/hr over 30 Minutes Intravenous  Once 03/30/17 1155 03/30/17 1427   03/30/17 1200  vancomycin (VANCOCIN) IVPB 1000 mg/200 mL premix  Status:  Discontinued     1,000 mg 200 mL/hr over 60 Minutes Intravenous  Once 03/30/17 1155 03/30/17 1202        Objective:   Vitals:   04/02/17 0756 04/02/17 0800 04/02/17 0900 04/02/17 1000  BP:  (!) 145/52 140/78 (!) 155/67  Pulse:  83 81 85  Resp:  (!) 32 18 (!) 24  Temp: 98 F (36.7 C)     TempSrc: Oral     SpO2:  100% 100% 100%  Weight:      Height:        Wt Readings from Last 3 Encounters:  04/02/17 112.7 kg (248 lb 7.3 oz)  03/13/17 113.4 kg (250 lb)  03/12/17 112.9 kg (248 lb 12.8 oz)     Intake/Output Summary (Last 24 hours) at 04/02/17 1020 Last data filed at 04/02/17 1000  Gross per 24 hour  Intake             1470 ml  Output             1300 ml  Net              170 ml     Physical Exam  Awake alert 3, intact judgment and insight Supple neck, no JVD Clear to auscultation bilaterally, but mildly diminished air entry in the left  side secondary to pain, no use of accessory muscle Regular rate and rhythm, no rubs murmurs gallops Abdomen soft, nontender, nondistended, bowel sounds present, Has tenderness in left chest wall area. Ext with no edema, clubbing or cyanosis     Data Review:    CBC  Recent Labs Lab 03/30/17 1128 03/31/17 0756 04/01/17 0207 04/02/17 0218  WBC 14.1* 14.4* 10.4 10.5  HGB 11.0* 10.9* 9.7* 9.7*  HCT 35.5* 36.5 32.7* 32.4*  PLT 227 249 247 253  MCV 95.2 97.9 98.2 96.7  MCH  29.5 29.2 29.1 29.0  MCHC 31.0 29.9* 29.7* 29.9*  RDW 15.9* 15.9* 15.9* 15.5  LYMPHSABS 1.0  --   --   --   MONOABS 1.1*  --   --   --   EOSABS 0.0  --   --   --   BASOSABS 0.0  --   --   --     Chemistries   Recent Labs Lab 03/30/17 1128 03/31/17 0756 03/31/17 1849 04/01/17 0207 04/01/17 1520 04/02/17 0218  NA 134* 139 143 143 138 141  K 4.8 4.9 4.8 4.5 4.5 4.4  CL 101 103 104 104 102 103  CO2 23 29 32 28 27 30   GLUCOSE 282* 241* 142* 132* 169* 126*  BUN 31* 32* 35* 39* 39* 36*  CREATININE 2.31* 2.36* 2.51* 2.66* 2.31* 2.18*  CALCIUM 8.1* 8.0* 8.1* 8.0* 8.4* 8.5*  MG 1.8  --   --  2.2  --  2.2  AST 24  --   --   --   --   --   ALT 23  --   --   --   --   --   ALKPHOS 97  --   --   --   --   --   BILITOT 0.7  --   --   --   --   --    ------------------------------------------------------------------------------------------------------------------ No results for input(s): CHOL, HDL, LDLCALC, TRIG, CHOLHDL, LDLDIRECT in the last 72 hours.  Lab Results  Component Value Date   HGBA1C 7.1 (H) 03/11/2017   ------------------------------------------------------------------------------------------------------------------ No results for input(s): TSH, T4TOTAL, T3FREE, THYROIDAB in the last 72 hours.  Invalid input(s): FREET3 ------------------------------------------------------------------------------------------------------------------ No results for input(s): VITAMINB12, FOLATE, FERRITIN, TIBC, IRON, RETICCTPCT in the last 72 hours.  Coagulation profile  Recent Labs Lab 03/30/17 1128  INR 1.65    No results for input(s): DDIMER in the last 72 hours.  Cardiac Enzymes  Recent Labs Lab 03/31/17 0756 03/31/17 1234 03/31/17 1849  TROPONINI 0.18* 0.05* 0.03*   ------------------------------------------------------------------------------------------------------------------ No results found for: BNP  Inpatient Medications  Scheduled Meds: . apixaban  5 mg  Oral BID  . colchicine  0.6 mg Oral Daily  . insulin aspart  2-6 Units Subcutaneous Q4H  . insulin glargine  20 Units Subcutaneous Daily  . pantoprazole  40 mg Oral Daily  . pravastatin  40 mg Oral q1800  . rOPINIRole  4 mg Oral BID  . senna-docusate  2 tablet Oral BID   Continuous Infusions: . piperacillin-tazobactam (ZOSYN)  IV Stopped (04/02/17 0755)   PRN Meds:.acetaminophen **OR** acetaminophen, albuterol, hydrALAZINE, morphine injection, naloxone, ondansetron **OR** ondansetron (ZOFRAN) IV  Micro Results Recent Results (from the past 240 hour(s))  Culture, blood (Routine x 2)     Status: Abnormal   Collection Time: 03/30/17 12:28 PM  Result Value Ref Range Status   Specimen Description BLOOD RIGHT FOREARM  Final   Special Requests   Final  BOTTLES DRAWN AEROBIC AND ANAEROBIC Blood Culture adequate volume   Culture  Setup Time   Final    GRAM NEGATIVE RODS ANAEROBIC BOTTLE ONLY CRITICAL RESULT CALLED TO, READ BACK BY AND VERIFIED WITH: PHARMD T STONE [id number] MLM    Culture ESCHERICHIA COLI (A)  Final   Report Status 04/02/2017 FINAL  Final   Organism ID, Bacteria ESCHERICHIA COLI  Final      Susceptibility   Escherichia coli - MIC*    AMPICILLIN >=32 RESISTANT Resistant     CEFAZOLIN <=4 SENSITIVE Sensitive     CEFEPIME <=1 SENSITIVE Sensitive     CEFTAZIDIME <=1 SENSITIVE Sensitive     CEFTRIAXONE <=1 SENSITIVE Sensitive     CIPROFLOXACIN <=0.25 SENSITIVE Sensitive     GENTAMICIN <=1 SENSITIVE Sensitive     IMIPENEM <=0.25 SENSITIVE Sensitive     TRIMETH/SULFA <=20 SENSITIVE Sensitive     AMPICILLIN/SULBACTAM 16 INTERMEDIATE Intermediate     PIP/TAZO <=4 SENSITIVE Sensitive     Extended ESBL NEGATIVE Sensitive     * ESCHERICHIA COLI  Blood Culture ID Panel (Reflexed)     Status: Abnormal   Collection Time: 03/30/17 12:28 PM  Result Value Ref Range Status   Enterococcus species NOT DETECTED NOT DETECTED Final   Listeria monocytogenes NOT DETECTED NOT  DETECTED Final   Staphylococcus species NOT DETECTED NOT DETECTED Final   Staphylococcus aureus NOT DETECTED NOT DETECTED Final   Streptococcus species NOT DETECTED NOT DETECTED Final   Streptococcus agalactiae NOT DETECTED NOT DETECTED Final   Streptococcus pneumoniae NOT DETECTED NOT DETECTED Final   Streptococcus pyogenes NOT DETECTED NOT DETECTED Final   Acinetobacter baumannii NOT DETECTED NOT DETECTED Final   Enterobacteriaceae species DETECTED (A) NOT DETECTED Final    Comment: Enterobacteriaceae represent a large family of gram-negative bacteria, not a single organism. CRITICAL RESULT CALLED TO, READ BACK BY AND VERIFIED WITH: PHARMD T STONE [id number] MLM    Enterobacter cloacae complex NOT DETECTED NOT DETECTED Final   Escherichia coli DETECTED (A) NOT DETECTED Final    Comment: CRITICAL RESULT CALLED TO, READ BACK BY AND VERIFIED WITH: PHARMD T STONE [id number] MLM    Klebsiella oxytoca NOT DETECTED NOT DETECTED Final   Klebsiella pneumoniae NOT DETECTED NOT DETECTED Final   Proteus species NOT DETECTED NOT DETECTED Final   Serratia marcescens NOT DETECTED NOT DETECTED Final   Carbapenem resistance NOT DETECTED NOT DETECTED Final   Haemophilus influenzae NOT DETECTED NOT DETECTED Final   Neisseria meningitidis NOT DETECTED NOT DETECTED Final   Pseudomonas aeruginosa NOT DETECTED NOT DETECTED Final   Candida albicans NOT DETECTED NOT DETECTED Final   Candida glabrata NOT DETECTED NOT DETECTED Final   Candida krusei NOT DETECTED NOT DETECTED Final   Candida parapsilosis NOT DETECTED NOT DETECTED Final   Candida tropicalis NOT DETECTED NOT DETECTED Final  Culture, blood (Routine x 2)     Status: None (Preliminary result)   Collection Time: 03/30/17  1:11 PM  Result Value Ref Range Status   Specimen Description BLOOD RIGHT HAND  Final   Special Requests   Final    BOTTLES DRAWN AEROBIC AND ANAEROBIC Blood Culture adequate volume   Culture NO GROWTH 2 DAYS  Final    Report Status PENDING  Incomplete  MRSA PCR Screening     Status: None   Collection Time: 03/31/17  5:32 AM  Result Value Ref Range Status   MRSA by PCR NEGATIVE NEGATIVE Final    Comment:  The GeneXpert MRSA Assay (FDA approved for NASAL specimens only), is one component of a comprehensive MRSA colonization surveillance program. It is not intended to diagnose MRSA infection nor to guide or monitor treatment for MRSA infections.     Radiology Reports Dg Chest 2 View  Result Date: 03/30/2017 CLINICAL DATA:  Shortness of breath since falling yesterday, LEFT arm pain, hypertension, diabetes mellitus, COPD, former smoker EXAM: CHEST  2 VIEW COMPARISON:  03/2017 FINDINGS: Enlargement of cardiac silhouette. Mediastinal contours and pulmonary vascularity normal. RIGHT basilar atelectasis. Underpenetration of LEFT lung base. Remaining lungs clear. Rotated to the LEFT. Central peribronchial thickening. No gross pleural effusion or pneumothorax. Bones demineralized. IMPRESSION: Enlargement of cardiac silhouette. Bronchitic changes with RIGHT basilar atelectasis. Electronically Signed   By: Lavonia Dana M.D.   On: 03/30/2017 13:01   Dg Chest 2 View  Result Date: 03/11/2017 CLINICAL DATA:  Acute onset of wheezing. Known DVT. Initial encounter. EXAM: CHEST  2 VIEW COMPARISON:  Chest radiograph performed 10/01/2009 FINDINGS: The lungs are well-aerated. Pulmonary vascularity is at the upper limits of normal. There is no evidence of focal opacification, pleural effusion or pneumothorax. The heart is normal in size; the mediastinal contour is within normal limits. No acute osseous abnormalities are seen. Anterior bridging osteophytes are noted along the lower thoracic spine. IMPRESSION: No acute cardiopulmonary process seen. Electronically Signed   By: Garald Balding M.D.   On: 03/11/2017 01:03   Dg Ribs Unilateral Left  Result Date: 04/01/2017 CLINICAL DATA:  Recent fall, left chest pain EXAM: LEFT  RIBS - 2 VIEW COMPARISON:  03/31/2017 FINDINGS: Multiple acute left rib fractures involving the third through eighth ribs laterally. These are best demonstrated on the AP oblique view. Degenerative changes noted spine. Heart is mildly enlarged. Small left effusion and left basilar atelectasis/consolidation suspected. IMPRESSION: Acute left third through eighth rib fractures. No large pneumothorax Suspected small left effusion and left basilar atelectasis/consolidation Electronically Signed   By: Jerilynn Mages.  Shick M.D.   On: 04/01/2017 13:34   Ct Head Wo Contrast  Result Date: 03/31/2017 CLINICAL DATA:  Altered mental status. EXAM: CT HEAD WITHOUT CONTRAST TECHNIQUE: Contiguous axial images were obtained from the base of the skull through the vertex without intravenous contrast. COMPARISON:  CT scan of October 01, 2009. FINDINGS: Brain: Mild chronic ischemic white matter disease is noted. No mass effect or midline shift is noted. Ventricular size is within normal limits. There is no evidence of mass lesion, hemorrhage or acute infarction. Vascular: Atherosclerosis of carotid siphons is noted. Skull: Normal. Negative for fracture or focal lesion. Sinuses/Orbits: No acute finding. Other: None. IMPRESSION: Mild chronic ischemic white matter disease. No acute intracranial abnormality seen. Electronically Signed   By: Marijo Conception, M.D.   On: 03/31/2017 09:11   US Abdomen Complete  Result Date: 03/31/2017 CLINICAL DATA:  Left flank pain.  Generalized abdominal pain. EXAM: ABDOMEN ULTRASOUND COMPLETE COMPARISON:  Abdomen 03/31/2017. Ultrasound 09/11/2015. CT 12/12/2012. CT 04/07/2010 FINDINGS: Gallbladder: No gallstones or wall thickening visualized. No sonographic Murphy sign noted by sonographer. Common bile duct: Diameter: 4.1 mm Liver: No focal lesion identified. Within normal limits in parenchymal echogenicity. IVC: No abnormality visualized. Pancreas: Visualized portion unremarkable. Spleen: Size and appearance  within normal limits. Right Kidney: Length: 10.7 cm. Cortical thinning . Increased echogenicity. 1.1 cm stable simple cyst. No hydronephrosis visualized. Left Kidney: Length: 11.1 cm. Cortical thinning. Increased echogenicity. No mass or hydronephrosis visualized. Abdominal aorta: No aneurysm visualized. Other findings: None. IMPRESSION: 1. Increased renal echogenicity  consistent with chronic renal disease. Bilateral renal atrophy again noted. No acute renal abnormality. No hydronephrosis. 2.  No gallstones or biliary distention. Electronically Signed   By: Marcello Moores  Register   On: 03/31/2017 11:14   Dg Hand 2 View Left  Result Date: 04/01/2017 CLINICAL DATA:  Fall down to stairs with left hand pain. EXAM: LEFT HAND - 2 VIEW COMPARISON:  None. FINDINGS: Pulse oximeter obscures the fourth distal phalanx. Mild degenerative change over the radiocarpal joint and carpal bones as well as the third MCP joint. No acute fracture or dislocation. IMPRESSION: No acute findings. Electronically Signed   By: Marin Olp M.D.   On: 04/01/2017 13:37   Dg Chest Port 1 View  Result Date: 03/31/2017 CLINICAL DATA:  Initial evaluation for acute respiratory distress. EXAM: PORTABLE CHEST 1 VIEW COMPARISON:  Prior radiograph from 03/30/2017. FINDINGS: Study limited by patient positioning. Grossly stable cardiomegaly. Mediastinal silhouette within normal limits. Lungs hypoinflated. Increased pulmonary vascularity with interstitial prominence, suggestive of congestion without frank pulmonary edema. Patchy bibasilar opacities favored to reflect atelectasis/ bronchovascular crowding and/ or congestion. Superimposed infiltrates would be difficult to exclude. Suspected left pleural effusion. No pneumothorax. No acute osseus abnormality. Remote right-sided rib fractures noted. IMPRESSION: 1. Stable cardiomegaly with increased pulmonary vascular congestion and interstitial prominence as compared to previous, consistent with pulmonary  vascular congestion. No frank pulmonary edema. 2. Suspected left pleural effusion. 3. Shallow lung inflation. Patchy bibasilar opacities favored to reflect atelectasis/bronchovascular crowding and/or edema. Superimposed infiltrates would be difficult to exclude, and could be considered in the correct clinical setting. Electronically Signed   By: Jeannine Boga M.D.   On: 03/31/2017 04:45   Dg Shoulder Left  Result Date: 04/01/2017 CLINICAL DATA:  Fall down to stairs with left shoulder pain. EXAM: LEFT SHOULDER - 2+ VIEW COMPARISON:  Chest x-ray 03/30/2017 FINDINGS: There are degenerative changes of the [facility] joint and glenohumeral joints. There is no acute fracture or dislocation over the shoulder girdle. Evidence of fracture of the left lateral third through sixth ribs. Degenerate change of the spine. IMPRESSION: Degenerative change of the shoulder without acute injury. Acute fractures of the left lateral third through sixth ribs. Electronically Signed   By: Marin Olp M.D.   On: 04/01/2017 13:39   Dg Abd Portable 1v  Result Date: 03/31/2017 CLINICAL DATA:  Abdominal pain and distention. EXAM: PORTABLE ABDOMEN - 1 VIEW COMPARISON:  CT abdomen and pelvis 12/12/2012. FINDINGS: Image quality is degraded by patient body habitus. The far left lateral aspect of the abdomen was incompletely imaged. No gross intraperitoneal free air is identified on this supine study. Gas and a small amount of stool are present in the colon. No dilated loops of bowel are seen to suggest obstruction. 3 cm oval calcification in the right pelvis corresponds to a chronic peripherally calcified right ovarian lesion, grossly similar in size to the prior CT. Asymmetric right hip osteoarthrosis is noted, and there is diffuse thoracolumbar spondylosis. Asymmetric left basilar lung opacity is more fully evaluated on today's earlier chest radiographs. IMPRESSION: Nonobstructed bowel gas pattern. Electronically Signed   By: Logan Bores  M.D.   On: 03/31/2017 07:26   Dg Hip Unilat With Pelvis 2-3 Views Left  Result Date: 04/01/2017 CLINICAL DATA:  Fall down 2 stairs with left hip pain. EXAM: DG HIP (WITH OR WITHOUT PELVIS) 2-3V LEFT COMPARISON:  03/31/2017 as well as CT 12/12/2012 FINDINGS: There is diffuse decreased bone mineralization. Moderate osteoarthritic change of the right hip and mild degenerative  change of the left hip. No acute fracture or dislocation. Degenerative change of the spine. Oval rim calcified right adnexal structure unchanged. IMPRESSION: No acute findings per Osteoarthritic change of the hips right worse than left. Electronically Signed   By: Marin Olp M.D.   On: 04/01/2017 13:35     Mckensey Berghuis M.D on 04/02/2017 at 10:20 AM  Between 7am to 7pm - Pager - [phone]  After 7pm go to [email or web address] - password [facility]  [facility] -  Office  [phone]

## 2017-04-03 ENCOUNTER — Ambulatory Visit: Payer: PPO | Admitting: Podiatry

## 2017-04-03 LAB — CBC
HCT: 34.8 % — ABNORMAL LOW (ref 36.0–46.0)
Hemoglobin: 10.7 g/dL — ABNORMAL LOW (ref 12.0–15.0)
MCH: 29.2 pg (ref 26.0–34.0)
MCHC: 30.7 g/dL (ref 30.0–36.0)
MCV: 95.1 fL (ref 78.0–100.0)
Platelets: 298 10*3/uL (ref 150–400)
RBC: 3.66 MIL/uL — ABNORMAL LOW (ref 3.87–5.11)
RDW: 15.2 % (ref 11.5–15.5)
WBC: 12.1 10*3/uL — ABNORMAL HIGH (ref 4.0–10.5)

## 2017-04-03 LAB — GLUCOSE, CAPILLARY
Glucose-Capillary: 151 mg/dL — ABNORMAL HIGH (ref 65–99)
Glucose-Capillary: 227 mg/dL — ABNORMAL HIGH (ref 65–99)
Glucose-Capillary: 221 mg/dL — ABNORMAL HIGH (ref 65–99)
Glucose-Capillary: 365 mg/dL — ABNORMAL HIGH (ref 65–99)

## 2017-04-03 LAB — BASIC METABOLIC PANEL
Anion gap: 8 (ref 5–15)
BUN: 33 mg/dL — ABNORMAL HIGH (ref 6–20)
CO2: 30 mmol/L (ref 22–32)
Calcium: 8.7 mg/dL — ABNORMAL LOW (ref 8.9–10.3)
Chloride: 102 mmol/L (ref 101–111)
Creatinine, Ser: 1.8 mg/dL — ABNORMAL HIGH (ref 0.44–1.00)
GFR calc Af Amer: 32 mL/min — ABNORMAL LOW (ref >60)
GFR calc non Af Amer: 27 mL/min — ABNORMAL LOW (ref >60)
Glucose, Bld: 160 mg/dL — ABNORMAL HIGH (ref 65–99)
Potassium: 4.3 mmol/L (ref 3.5–5.1)
Sodium: 140 mmol/L (ref 135–145)

## 2017-04-03 MED ORDER — AMLODIPINE BESYLATE 10 MG PO TABS
10.0000 mg | ORAL_TABLET | Freq: Every day | ORAL | Status: DC
  Administered 2017-04-03 – 2017-04-07 (×5): 10 mg via ORAL
  Filled 2017-04-03 (×5): qty 1

## 2017-04-03 MED ORDER — BISACODYL 5 MG PO TBEC: 5.0000 mg | DELAYED_RELEASE_TABLET | Freq: Every day | ORAL | Status: DC | PRN

## 2017-04-03 MED ORDER — PNEUMOCOCCAL VAC POLYVALENT 25 MCG/0.5ML IJ INJ
0.5000 mL | INJECTION | INTRAMUSCULAR | Status: AC
  Administered 2017-04-05: 0.5 mL via INTRAMUSCULAR
  Filled 2017-04-03: qty 0.5

## 2017-04-03 MED ORDER — FUROSEMIDE 10 MG/ML IJ SOLN
40.0000 mg | Freq: Once | INTRAMUSCULAR | Status: AC
  Administered 2017-04-03: 40 mg via INTRAVENOUS
  Filled 2017-04-03: qty 4

## 2017-04-03 MED ORDER — LISINOPRIL 5 MG PO TABS: 5.0000 mg | ORAL_TABLET | Freq: Every day | ORAL | Status: DC

## 2017-04-03 MED ORDER — ALLOPURINOL 100 MG PO TABS
100.0000 mg | ORAL_TABLET | Freq: Every day | ORAL | Status: DC
  Administered 2017-04-03 – 2017-04-07 (×5): 100 mg via ORAL
  Filled 2017-04-03 (×5): qty 1

## 2017-04-03 MED ORDER — POLYETHYLENE GLYCOL 3350 17 G PO PACK
17.0000 g | PACK | Freq: Two times a day (BID) | ORAL | Status: AC
  Administered 2017-04-03 – 2017-04-04 (×3): 17 g via ORAL
  Filled 2017-04-03 (×3): qty 1

## 2017-04-03 MED ORDER — ORAL CARE MOUTH RINSE
15.0000 mL | Freq: Two times a day (BID) | OROMUCOSAL | Status: DC
  Administered 2017-04-03 – 2017-04-07 (×8): 15 mL via OROMUCOSAL

## 2017-04-03 MED ORDER — BISACODYL 5 MG PO TBEC
10.0000 mg | DELAYED_RELEASE_TABLET | Freq: Once | ORAL | Status: AC
  Administered 2017-04-03: 10 mg via ORAL
  Filled 2017-04-03: qty 2

## 2017-04-03 MED ORDER — FEBUXOSTAT 40 MG PO TABS
40.0000 mg | ORAL_TABLET | Freq: Every day | ORAL | Status: DC
  Administered 2017-04-04 – 2017-04-07 (×4): 40 mg via ORAL
  Filled 2017-04-03 (×6): qty 1

## 2017-04-03 NOTE — Clinical Social Work Note (Signed)
Clinical Social Work Assessment  Patient Details  Name: Jean Mcclain MRN: [id number] Date of Birth: July 05, 1946  Date of referral:  04/03/17               Reason for consult:  Facility Placement                Permission sought to share information with:  Chartered certified accountant granted to share information::  Yes, Verbal Permission Granted  Name::     Soil scientist::  SNF  Relationship::  brother  Contact Information:     Housing/Transportation Living arrangements for the past 2 months:  Single Family Home Source of Information:  Patient Patient Interpreter Needed:  None Criminal Activity/Legal Involvement Pertinent to Current Situation/Hospitalization:  No - Comment as needed Significant Relationships:  None Lives with:  Self Do you feel safe going back to the place where you live?  No Need for family participation in patient care:  No (Coment)  Care giving concerns:  Pt lives at home alone without access to much physical support and now with increased weakness.   Social Worker assessment / plan:  CSW spoke with pt and pt brother concerning PT recommendation for SNF.  CSW explained SNF and SNF referral process.  Employment status:  Retired Forensic scientist:    PT Recommendations:  [place] / Referral to community resources:  [place]  Patient/Family's Response to care:  Patient acknowledges her big change in mobility and inability to go safely home at this time so she is agreeable to try SNF though she has heard negative things about it.  Patient/Family's Understanding of and Emotional Response to Diagnosis, Current Treatment, and Prognosis:  Pt agreeable to plan though not enthusiastic about rehab placement she is motivated to do what is necessary to recover and return to independence.  Emotional Assessment Appearance:  Appears stated age Attitude/Demeanor/Rapport:    Affect (typically observed):   Appropriate, Accepting, Pleasant Orientation:  Oriented to Self, Oriented to Place, Oriented to  Time, Oriented to Situation Alcohol / Substance use:  Not Applicable Psych involvement (Current and /or in the community):  No (Comment)  Discharge Needs  Concerns to be addressed:  Care Coordination Readmission within the last 30 days:  Yes Current discharge risk:  Physical Impairment Barriers to Discharge:  Continued Medical Work up   Jorge Ny, LCSW 04/03/2017, 3:41 PM

## 2017-04-03 NOTE — Progress Notes (Addendum)
PROGRESS NOTE                                                                                                                                                                                                             Patient Demographics:    Jean Mcclain, is a 71 y.o. female, DOB - 1945-12-10, PPJ:[id number]  Admit date - 03/30/2017   Admitting Physician Elwin Mocha, MD  Outpatient Primary MD for the patient is Leanna Battles, MD  LOS - 4  Chief Complaint  Patient presents with  . Fall       Brief Narrative    71 y.o. female  with past medical history of diabetes, reflux, sleep apnea, osteopenia presented to the emergency room after fall downstairs, she sustained multiple rib fractures, as well workup significant for sepsis secondary to pyelonephritis/bacteremia.   Subjective:    Jean Mcclain today Report She is feeling better, still complaining of left-sided musculoskeletal chest pain, but improving, no cough, no chest pain, no shortness of breath, no nausea or vomiting .She continues to feel better, still complaining of left  Assessment  & Plan :    Principal Problem:   Sepsis ([place]) Active Problems:   Insulin-requiring or dependent type II diabetes mellitus ([place])   Essential hypertension   AKI (acute kidney injury) ([place])   Acute respiratory distress   Acute on chronic diastolic CHF (congestive heart failure) ([place])   Sepsis secondary to Escherichia coli pyelonephritis/bacteremia - Patient presents with fever, tachycardia, tachypnea, leukocytosis, and encephalopathy with worsening renal function - Blood culture growing Escherichia coli, patient with positive urinalysis, Urine culture with no growth , it was obtained> 24 hours after antibiotics initiation. - Pro-calcitonin trending down, lactic acid within normal limits - IV vancomycin stopped 6/23,  Escherichia coli sensitive to Rocephin, Zosyn changed to Rocephin on 6/24 .     Acute hypoxic/hypercarbic respiratory failure - This is most likely related to sepsis, and some volume overload, BiPAP management per PCCM, no BiPAP over last 24 hours, on oxygen via nasal cannula, wean as tolerated, will downgrade to stepdown .  Elevated troponin - in The setting of demand ischemia secondary to sepsis and respiratory failure, she denies any chest pain,  troponin trend is not non-ACS pattern, recent echo on 6/2, with a preserved EF, no regional wall motion abnormalities.  DVT -  Continue with Eliquis  Acute encephalopathy - Metabolic secondary to infection and hypercapnia . CT head with no acute findings - resolved, mentation back to baseline  Multiple rib fractures secondary to Recent fall  - Chest x-ray significant for rib fracture from rib 3 through 8, encouraged to use incentive spirometry, continue with when necessary pain medication, ambulate.  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  Recent left ankle fracture - Continue with CAM boot, continue with PT, to follow with orthogonal as an outpatient  GERD - Cont PPI  Hyperlipidemia - Continue statin  RLS - Cont requip bid  Code Status : Full  Family Communication  : None  at bedside  Disposition Plan  : We'll downgrad from ICU to stepdown today, PT consulted,  will need SNF, social worker consulted .   Consults  :  PCCM  Procedures  : None  DVT Prophylaxis  :  Eliquis  Lab Results  Component Value Date   PLT 298 04/03/2017    Antibiotics  :    Anti-infectives    Start     Dose/Rate Route Frequency Ordered Stop   04/02/17 1130  cefTRIAXone (ROCEPHIN) 2 g in dextrose 5 % 50 mL IVPB     2 g 100 mL/hr over 30 Minutes Intravenous Every 24 hours 04/02/17 1023     04/01/17 1300  vancomycin (VANCOCIN) 1,500 mg in sodium chloride 0.9 % 500 mL IVPB  Status:  Discontinued     1,500 mg 250 mL/hr over 120 Minutes Intravenous Every 48 hours 03/30/17 1216 03/31/17 0852   03/30/17 2100  piperacillin-tazobactam (ZOSYN) IVPB 3.375 g  Status:  Discontinued     3.375 g 12.5 mL/hr over 240 Minutes Intravenous Every 8 hours 03/30/17 1216 04/02/17 1023   03/30/17 1230  vancomycin (VANCOCIN) 2,000 mg in sodium chloride 0.9 % 500 mL IVPB     2,000 mg 250 mL/hr over 120 Minutes Intravenous  Once 03/30/17 1202 03/30/17 1535   03/30/17 1200  piperacillin-tazobactam (ZOSYN) IVPB 3.375 g     3.375 g 100 mL/hr over 30 Minutes Intravenous  Once 03/30/17 1155 03/30/17 1427   03/30/17 1200  vancomycin (VANCOCIN) IVPB 1000 mg/200 mL premix  Status:  Discontinued     1,000 mg 200 mL/hr over 60 Minutes Intravenous  Once 03/30/17 1155 03/30/17 1202        Objective:   Vitals:   04/03/17 0700 04/03/17 0800 04/03/17 0900 04/03/17 1200  BP: (!) 159/81 (!) 181/83 (!) 192/91 (!) 155/88  Pulse: 84 99 95 92  Resp: (!) 22 (!) 26 18 (!) 27  Temp: 98.9 F (37.2 C)   98.8 F (37.1 C)  TempSrc: Oral   Oral  SpO2: 100% 98% 97% 100%  Weight:      Height:        Wt Readings from Last 3 Encounters:  04/03/17 110.4 kg (243  lb 6.2 oz)  03/13/17 113.4 kg (250 lb)  03/12/17 112.9 kg (248 lb 12.8 oz)     Intake/Output Summary (Last 24 hours) at 04/03/17 1301 Last data filed at 04/03/17 1200  Gross per 24 hour  Intake              600 ml  Output             2050 ml    Net            -1450 ml     Physical Exam  Awake alert 3, intact judgment and insight Supple neck, no JVD Clear to auscultation bilaterally, mildly diminished minister entry in left lung base, but otherwise no rales wheezing or rhonchi , no use of accessory muscle , some tenderness to palpation in left chest area . Regular rate and rhythm, no rubs murmurs gallops Abdomen soft, nontender, nondistended, bowel sounds present, left CVA tenderness resolved Ext with mild edema today, no clubbing or cyanosis     Data Review:    CBC  Recent Labs Lab 03/30/17 1128 03/31/17 0756 04/01/17 0207 04/02/17 0218 04/03/17 0218  WBC 14.1* 14.4* 10.4 10.5 12.1*  HGB 11.0* 10.9* 9.7* 9.7* 10.7*  HCT 35.5* 36.5 32.7* 32.4* 34.8*  PLT 227 249 247 253 298  MCV 95.2 97.9 98.2 96.7 95.1  MCH 29.5 29.2 29.1 29.0 29.2  MCHC 31.0 29.9* 29.7* 29.9* 30.7  RDW 15.9* 15.9* 15.9* 15.5 15.2  LYMPHSABS 1.0  --   --   --   --   MONOABS 1.1*  --   --   --   --   EOSABS 0.0  --   --   --   --   BASOSABS 0.0  --   --   --   --     Chemistries   Recent Labs Lab 03/30/17 1128  03/31/17 1849 04/01/17 0207 04/01/17 1520 04/02/17 0218 04/03/17 0218  NA 134*  < > 143 143 138 141 140  K 4.8  < > 4.8 4.5 4.5 4.4 4.3  CL 101  < > 104 104 102 103 102  CO2 23  < > 32 28 27 30 30   GLUCOSE 282*  < > 142* 132* 169* 126* 160*  BUN 31*  < > 35* 39* 39* 36* 33*  CREATININE 2.31*  < > 2.51* 2.66* 2.31* 2.18* 1.80*  CALCIUM 8.1*  < > 8.1* 8.0* 8.4* 8.5* 8.7*  MG 1.8  --   --  2.2  --  2.2  --   AST 24  --   --   --   --   --   --   ALT 23  --   --   --   --   --   --   ALKPHOS 97  --   --   --   --   --   --   BILITOT 0.7  --   --   --   --   --   --   < > = values in this interval not displayed. ------------------------------------------------------------------------------------------------------------------ No results for input(s): CHOL, HDL, LDLCALC, TRIG, CHOLHDL, LDLDIRECT in the last 72 hours.  Lab  Results  Component Value Date   HGBA1C 7.1 (H) 03/11/2017   ------------------------------------------------------------------------------------------------------------------ No results for input(s): TSH, T4TOTAL, T3FREE, THYROIDAB in the last 72 hours.  Invalid input(s): FREET3 ------------------------------------------------------------------------------------------------------------------ No results for input(s): VITAMINB12, FOLATE, FERRITIN, TIBC, IRON, RETICCTPCT in the last 72 hours.  Coagulation  profile  Recent Labs Lab 03/30/17 1128  INR 1.65    No results for input(s): DDIMER in the last 72 hours.  Cardiac Enzymes  Recent Labs Lab 03/31/17 0756 03/31/17 1234 03/31/17 1849  TROPONINI 0.18* 0.05* 0.03*   ------------------------------------------------------------------------------------------------------------------ No results found for: BNP  Inpatient Medications  Scheduled Meds: . allopurinol  100 mg Oral Daily  . amLODipine  10 mg Oral Daily  . apixaban  5 mg Oral BID  . colchicine  0.6 mg Oral Daily  . febuxostat  40 mg Oral Daily  . insulin aspart  0-9 Units Subcutaneous TID WC  . insulin glargine  20 Units Subcutaneous Daily  . mouth rinse  15 mL Mouth Rinse BID  . pantoprazole  40 mg Oral Daily  . [START ON 04/04/2017] pneumococcal 23 valent vaccine  0.5 mL Intramuscular Tomorrow-1000  . polyethylene glycol  17 g Oral BID  . pravastatin  40 mg Oral q1800  . rOPINIRole  4 mg Oral BID  . senna-docusate  2 tablet Oral BID   Continuous Infusions: . cefTRIAXone (ROCEPHIN)  IV 2 g (04/03/17 1230)   PRN Meds:.acetaminophen **OR** acetaminophen, albuterol, bisacodyl, hydrALAZINE, morphine injection, naloxone, ondansetron **OR** ondansetron (ZOFRAN) IV, oxyCODONE  Micro Results Recent Results (from the past 240 hour(s))  Culture, blood (Routine x 2)     Status: Abnormal   Collection Time: 03/30/17 12:28 PM  Result Value Ref Range Status   Specimen  Description BLOOD RIGHT FOREARM  Final   Special Requests   Final    BOTTLES DRAWN AEROBIC AND ANAEROBIC Blood Culture adequate volume   Culture  Setup Time   Final    GRAM NEGATIVE RODS ANAEROBIC BOTTLE ONLY CRITICAL RESULT CALLED TO, READ BACK BY AND VERIFIED WITH: PHARMD T STONE [id number] MLM    Culture ESCHERICHIA COLI (A)  Final   Report Status 04/02/2017 FINAL  Final   Organism ID, Bacteria ESCHERICHIA COLI  Final      Susceptibility   Escherichia coli - MIC*    AMPICILLIN >=32 RESISTANT Resistant     CEFAZOLIN <=4 SENSITIVE Sensitive     CEFEPIME <=1 SENSITIVE Sensitive     CEFTAZIDIME <=1 SENSITIVE Sensitive     CEFTRIAXONE <=1 SENSITIVE Sensitive     CIPROFLOXACIN <=0.25 SENSITIVE Sensitive     GENTAMICIN <=1 SENSITIVE Sensitive     IMIPENEM <=0.25 SENSITIVE Sensitive     TRIMETH/SULFA <=20 SENSITIVE Sensitive     AMPICILLIN/SULBACTAM 16 INTERMEDIATE Intermediate     PIP/TAZO <=4 SENSITIVE Sensitive     Extended ESBL NEGATIVE Sensitive     * ESCHERICHIA COLI  Blood Culture ID Panel (Reflexed)     Status: Abnormal   Collection Time: 03/30/17 12:28 PM  Result Value Ref Range Status   Enterococcus species NOT DETECTED NOT DETECTED Final   Listeria monocytogenes NOT DETECTED NOT DETECTED Final   Staphylococcus species NOT DETECTED NOT DETECTED Final   Staphylococcus aureus NOT DETECTED NOT DETECTED Final   Streptococcus species NOT DETECTED NOT DETECTED Final   Streptococcus agalactiae NOT DETECTED NOT DETECTED Final   Streptococcus pneumoniae NOT DETECTED NOT DETECTED Final   Streptococcus pyogenes NOT DETECTED NOT DETECTED Final   Acinetobacter baumannii NOT DETECTED NOT DETECTED Final   Enterobacteriaceae species DETECTED (A) NOT DETECTED Final    Comment: Enterobacteriaceae represent a large family of gram-negative bacteria, not a single organism. CRITICAL RESULT CALLED TO, READ BACK BY AND VERIFIED WITH: PHARMD T STONE [id number] MLM    Enterobacter cloacae  complex NOT DETECTED NOT DETECTED Final   Escherichia coli DETECTED (A) NOT DETECTED Final    Comment: CRITICAL RESULT CALLED TO, READ BACK BY AND VERIFIED WITH: PHARMD T STONE [id number] MLM    Klebsiella oxytoca NOT DETECTED NOT DETECTED Final   Klebsiella pneumoniae NOT DETECTED NOT DETECTED Final   Proteus species NOT DETECTED NOT DETECTED Final   Serratia marcescens NOT DETECTED NOT DETECTED Final   Carbapenem resistance NOT DETECTED NOT DETECTED Final   Haemophilus influenzae NOT DETECTED NOT DETECTED Final   Neisseria meningitidis NOT DETECTED NOT DETECTED Final   Pseudomonas aeruginosa NOT DETECTED NOT DETECTED Final   Candida albicans NOT DETECTED NOT DETECTED Final   Candida glabrata NOT DETECTED NOT DETECTED Final   Candida krusei NOT DETECTED NOT DETECTED Final   Candida parapsilosis NOT DETECTED NOT DETECTED Final   Candida tropicalis NOT DETECTED NOT DETECTED Final  Culture, blood (Routine x 2)     Status: None (Preliminary result)   Collection Time: 03/30/17  1:11 PM  Result Value Ref Range Status   Specimen Description BLOOD RIGHT HAND  Final   Special Requests   Final    BOTTLES DRAWN AEROBIC AND ANAEROBIC Blood Culture adequate volume   Culture NO GROWTH 4 DAYS  Final   Report Status PENDING  Incomplete  MRSA PCR Screening     Status: None   Collection Time: 03/31/17  5:32 AM  Result Value Ref Range Status   MRSA by PCR NEGATIVE NEGATIVE Final    Comment:        The GeneXpert MRSA Assay (FDA approved for NASAL specimens only), is one component of a comprehensive MRSA colonization surveillance program. It is not intended to diagnose MRSA infection nor to guide or monitor treatment for MRSA infections.   Culture, Urine     Status: None   Collection Time: 04/01/17 11:29 AM  Result Value Ref Range Status   Specimen Description URINE, CATHETERIZED  Final   Special Requests NONE  Final   Culture NO GROWTH  Final   Report Status 04/02/2017 FINAL  Final     Radiology Reports Dg Chest 2 View  Result Date: 03/30/2017 CLINICAL DATA:  Shortness of breath since falling yesterday, LEFT arm pain, hypertension, diabetes mellitus, COPD, former smoker EXAM: CHEST  2 VIEW COMPARISON:  03/2017 FINDINGS: Enlargement of cardiac silhouette. Mediastinal contours and pulmonary vascularity normal. RIGHT basilar atelectasis. Underpenetration of LEFT lung base. Remaining lungs clear. Rotated to the LEFT. Central peribronchial thickening. No gross pleural effusion or pneumothorax. Bones demineralized. IMPRESSION: Enlargement of cardiac silhouette. Bronchitic changes with RIGHT basilar atelectasis. Electronically Signed   By: Lavonia Dana M.D.   On: 03/30/2017 13:01   Dg Chest 2 View  Result Date: 03/11/2017 CLINICAL DATA:  Acute onset of wheezing. Known DVT. Initial encounter. EXAM: CHEST  2 VIEW COMPARISON:  Chest radiograph performed 10/01/2009 FINDINGS: The lungs are well-aerated. Pulmonary vascularity is at the upper limits of normal. There is no evidence of focal opacification, pleural effusion or pneumothorax. The heart is normal in size; the mediastinal contour is within normal limits. No acute osseous abnormalities are seen. Anterior bridging osteophytes are noted along the lower thoracic spine. IMPRESSION: No acute cardiopulmonary process seen. Electronically Signed   By: Garald Balding M.D.   On: 03/11/2017 01:03   Dg Ribs Unilateral Left  Result Date: 04/01/2017 CLINICAL DATA:  Recent fall, left chest pain EXAM: LEFT RIBS - 2 VIEW COMPARISON:  03/31/2017 FINDINGS: Multiple acute left rib fractures involving  the third through eighth ribs laterally. These are best demonstrated on the AP oblique view. Degenerative changes noted spine. Heart is mildly enlarged. Small left effusion and left basilar atelectasis/consolidation suspected. IMPRESSION: Acute left third through eighth rib fractures. No large pneumothorax Suspected small left effusion and left basilar  atelectasis/consolidation Electronically Signed   By: Jerilynn Mages.  Shick M.D.   On: 04/01/2017 13:34   Ct Head Wo Contrast  Result Date: 03/31/2017 CLINICAL DATA:  Altered mental status. EXAM: CT HEAD WITHOUT CONTRAST TECHNIQUE: Contiguous axial images were obtained from the base of the skull through the vertex without intravenous contrast. COMPARISON:  CT scan of October 01, 2009. FINDINGS: Brain: Mild chronic ischemic white matter disease is noted. No mass effect or midline shift is noted. Ventricular size is within normal limits. There is no evidence of mass lesion, hemorrhage or acute infarction. Vascular: Atherosclerosis of carotid siphons is noted. Skull: Normal. Negative for fracture or focal lesion. Sinuses/Orbits: No acute finding. Other: None. IMPRESSION: Mild chronic ischemic white matter disease. No acute intracranial abnormality seen. Electronically Signed   By: Marijo Conception, M.D.   On: 03/31/2017 09:11   US Abdomen Complete  Result Date: 03/31/2017 CLINICAL DATA:  Left flank pain.  Generalized abdominal pain. EXAM: ABDOMEN ULTRASOUND COMPLETE COMPARISON:  Abdomen 03/31/2017. Ultrasound 09/11/2015. CT 12/12/2012. CT 04/07/2010 FINDINGS: Gallbladder: No gallstones or wall thickening visualized. No sonographic Murphy sign noted by sonographer. Common bile duct: Diameter: 4.1 mm Liver: No focal lesion identified. Within normal limits in parenchymal echogenicity. IVC: No abnormality visualized. Pancreas: Visualized portion unremarkable. Spleen: Size and appearance within normal limits. Right Kidney: Length: 10.7 cm. Cortical thinning . Increased echogenicity. 1.1 cm stable simple cyst. No hydronephrosis visualized. Left Kidney: Length: 11.1 cm. Cortical thinning. Increased echogenicity. No mass or hydronephrosis visualized. Abdominal aorta: No aneurysm visualized. Other findings: None. IMPRESSION: 1. Increased renal echogenicity consistent with chronic renal disease. Bilateral renal atrophy again noted.  No acute renal abnormality. No hydronephrosis. 2.  No gallstones or biliary distention. Electronically Signed   By: Marcello Moores  Register   On: 03/31/2017 11:14   Dg Hand 2 View Left  Result Date: 04/01/2017 CLINICAL DATA:  Fall down to stairs with left hand pain. EXAM: LEFT HAND - 2 VIEW COMPARISON:  None. FINDINGS: Pulse oximeter obscures the fourth distal phalanx. Mild degenerative change over the radiocarpal joint and carpal bones as well as the third MCP joint. No acute fracture or dislocation. IMPRESSION: No acute findings. Electronically Signed   By: Marin Olp M.D.   On: 04/01/2017 13:37   Dg Chest Port 1 View  Result Date: 03/31/2017 CLINICAL DATA:  Initial evaluation for acute respiratory distress. EXAM: PORTABLE CHEST 1 VIEW COMPARISON:  Prior radiograph from 03/30/2017. FINDINGS: Study limited by patient positioning. Grossly stable cardiomegaly. Mediastinal silhouette within normal limits. Lungs hypoinflated. Increased pulmonary vascularity with interstitial prominence, suggestive of congestion without frank pulmonary edema. Patchy bibasilar opacities favored to reflect atelectasis/ bronchovascular crowding and/ or congestion. Superimposed infiltrates would be difficult to exclude. Suspected left pleural effusion. No pneumothorax. No acute osseus abnormality. Remote right-sided rib fractures noted. IMPRESSION: 1. Stable cardiomegaly with increased pulmonary vascular congestion and interstitial prominence as compared to previous, consistent with pulmonary vascular congestion. No frank pulmonary edema. 2. Suspected left pleural effusion. 3. Shallow lung inflation. Patchy bibasilar opacities favored to reflect atelectasis/bronchovascular crowding and/or edema. Superimposed infiltrates would be difficult to exclude, and could be considered in the correct clinical setting. Electronically Signed   By: Pincus Badder.D.  On: 03/31/2017 04:45   Dg Shoulder Left  Result Date:  04/01/2017 CLINICAL DATA:  Fall down to stairs with left shoulder pain. EXAM: LEFT SHOULDER - 2+ VIEW COMPARISON:  Chest x-ray 03/30/2017 FINDINGS: There are degenerative changes of the [facility] joint and glenohumeral joints. There is no acute fracture or dislocation over the shoulder girdle. Evidence of fracture of the left lateral third through sixth ribs. Degenerate change of the spine. IMPRESSION: Degenerative change of the shoulder without acute injury. Acute fractures of the left lateral third through sixth ribs. Electronically Signed   By: Marin Olp M.D.   On: 04/01/2017 13:39   Dg Abd Portable 1v  Result Date: 03/31/2017 CLINICAL DATA:  Abdominal pain and distention. EXAM: PORTABLE ABDOMEN - 1 VIEW COMPARISON:  CT abdomen and pelvis 12/12/2012. FINDINGS: Image quality is degraded by patient body habitus. The far left lateral aspect of the abdomen was incompletely imaged. No gross intraperitoneal free air is identified on this supine study. Gas and a small amount of stool are present in the colon. No dilated loops of bowel are seen to suggest obstruction. 3 cm oval calcification in the right pelvis corresponds to a chronic peripherally calcified right ovarian lesion, grossly similar in size to the prior CT. Asymmetric right hip osteoarthrosis is noted, and there is diffuse thoracolumbar spondylosis. Asymmetric left basilar lung opacity is more fully evaluated on today's earlier chest radiographs. IMPRESSION: Nonobstructed bowel gas pattern. Electronically Signed   By: Logan Bores M.D.   On: 03/31/2017 07:26   Dg Hip Unilat With Pelvis 2-3 Views Left  Result Date: 04/01/2017 CLINICAL DATA:  Fall down 2 stairs with left hip pain. EXAM: DG HIP (WITH OR WITHOUT PELVIS) 2-3V LEFT COMPARISON:  03/31/2017 as well as CT 12/12/2012 FINDINGS: There is diffuse decreased bone mineralization. 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 No acute fracture or  dislocation. Degenerative change of the spine. Oval rim calcified right adnexal structure unchanged. IMPRESSION: No acute findings per Osteoarthritic change of the hips right worse than left. Electronically Signed   By: Marin Olp M.D.   On: 04/01/2017 13:35     Alyne Martinson M.D on 04/03/2017 at 1:01 PM  Between 7am to 7pm - Pager - [phone]  After 7pm go to [email or web address] - password [facility]  [facility] -  Office  [phone]

## 2017-04-03 NOTE — [place] FL2 (Addendum)
[place] MEDICAID FL2 LEVEL OF CARE SCREENING TOOL     IDENTIFICATION  Patient Name: Jean Mcclain Birthdate: 1946-04-26 Sex: female Admission Date (Current Location): 03/30/2017  [facility] and [place] Number:  Herbalist and Address:  [facility] [place], [place] [address], [place], [place] [address]      Provider Number: [id number]  Attending Physician Name and Address:  Elgergawy, Silver Huguenin, MD  Relative Name and Phone Number:       Current Level of Care: Hospital Recommended Level of Care: [place] Prior Approval Number:    Date Approved/Denied:   PASRR Number: [id number]  Discharge Plan: SNF    Current Diagnoses: Patient Active Problem List   Diagnosis Date Noted  . Acute respiratory distress   . Acute on chronic diastolic CHF (congestive heart failure) ([place])   . AKI (acute kidney injury) ([place]) 03/30/2017  . Sepsis ([place]) 03/30/2017  . Acute deep vein thrombosis (DVT) of popliteal vein of left lower extremity ([place]) 03/11/2017  . Renal insufficiency 03/11/2017  . Insulin-requiring or dependent type II diabetes mellitus ([place]) 03/11/2017  . Essential hypertension 03/11/2017  . Impingement syndrome of left ankle 01/31/2017  . Posterior tibial tendinitis, left leg 01/31/2017  . Incarcerated ventral hernia 01/02/2013    Orientation RESPIRATION BLADDER Height & Weight     Self, Time, Situation, Place  O2 (4L [place]) Continent Weight: 243 lb 6.2 oz (110.4 kg) Height:  5' (152.4 cm)  BEHAVIORAL SYMPTOMS/MOOD NEUROLOGICAL BOWEL NUTRITION STATUS      Continent Diet (carb modified)  AMBULATORY STATUS COMMUNICATION OF NEEDS Skin   Extensive Assist Verbally Normal                       Personal Care Assistance Level of Assistance  Bathing, Dressing Bathing Assistance: Maximum assistance   Dressing Assistance: Maximum assistance     Functional Limitations Info             SPECIAL CARE FACTORS FREQUENCY  PT (By  licensed PT), OT (By licensed OT)     PT Frequency: 5/wk OT Frequency: 5/wk            Contractures      Additional Factors Info  Code Status, Allergies, Insulin Sliding Scale Code Status Info: FULL Allergies Info: NKA   Insulin Sliding Scale Info: 4/day       Current Medications (04/03/2017):  This is the current hospital active medication list Current Facility-Administered Medications  Medication Dose Route Frequency Provider Last Rate Last Dose  . acetaminophen (TYLENOL) tablet 650 mg  650 mg Oral Q6H PRN Elwin Mocha, MD   650 mg at 04/03/17 1238   Or  . acetaminophen (TYLENOL) suppository 650 mg  650 mg Rectal Q6H PRN Elwin Mocha, MD      . albuterol (PROVENTIL) (2.5 MG/3ML) 0.083% nebulizer solution 2.5 mg  2.5 mg Nebulization Q2H PRN Elwin Mocha, MD   2.5 mg at 04/01/17 1130  . allopurinol (ZYLOPRIM) tablet 100 mg  100 mg Oral Daily Elgergawy, Silver Huguenin, MD   100 mg at 04/03/17 1500  . amLODipine (NORVASC) tablet 10 mg  10 mg Oral Daily Elgergawy, Silver Huguenin, MD   10 mg at 04/03/17 1500  . apixaban (ELIQUIS) tablet 5 mg  5 mg Oral BID Elwin Mocha, MD   5 mg at 04/03/17 6381  . bisacodyl (DULCOLAX) EC tablet 5 mg  5 mg Oral Daily PRN Elgergawy, Silver Huguenin,  MD      . cefTRIAXone (ROCEPHIN) 2 g in dextrose 5 % 50 mL IVPB  2 g Intravenous Q24H Elgergawy, Silver Huguenin, MD   Stopped at 04/03/17 1300  . colchicine tablet 0.6 mg  0.6 mg Oral Daily Elwin Mocha, MD   0.6 mg at 04/03/17 6811  . febuxostat (ULORIC) tablet 40 mg  40 mg Oral Daily Elgergawy, Silver Huguenin, MD      . hydrALAZINE (APRESOLINE) injection 10 mg  10 mg Intravenous Q8H PRN Elwin Mocha, MD      . insulin aspart (novoLOG) injection 0-9 Units  0-9 Units Subcutaneous TID WC Elgergawy, Silver Huguenin, MD   3 Units at 04/03/17 1238  . insulin glargine (LANTUS) injection 20 Units  20 Units Subcutaneous Daily Elgergawy, Silver Huguenin, MD   20 Units at 04/03/17 1127  . MEDLINE mouth rinse  15 mL Mouth Rinse BID  Elgergawy, Silver Huguenin, MD   15 mL at 04/03/17 1115  . morphine 4 MG/ML injection 2-4 mg  2-4 mg Intravenous Q4H PRN Elgergawy, Silver Huguenin, MD   4 mg at 04/02/17 2135  . naloxone [facility]) injection 0.4 mg  0.4 mg Intravenous PRN Elwin Mocha, MD   0.4 mg at 03/31/17 0400  . ondansetron (ZOFRAN) tablet 4 mg  4 mg Oral Q6H PRN Elwin Mocha, MD   4 mg at 04/03/17 1238   Or  . ondansetron [facility]) injection 4 mg  4 mg Intravenous Q6H PRN Elwin Mocha, MD      . oxyCODONE (Oxy IR/ROXICODONE) immediate release tablet 5 mg  5 mg Oral Q4H PRN Elgergawy, Silver Huguenin, MD   5 mg at 04/03/17 1238  . pantoprazole (PROTONIX) EC tablet 40 mg  40 mg Oral Daily Elwin Mocha, MD   40 mg at 04/03/17 5726  . [START ON 04/04/2017] pneumococcal 23 valent vaccine (PNU-IMMUNE) injection 0.5 mL  0.5 mL Intramuscular Tomorrow-1000 Elgergawy, Dawood S, MD      . polyethylene glycol (MIRALAX / GLYCOLAX) packet 17 g  17 g Oral BID Elgergawy, Silver Huguenin, MD   17 g at 04/03/17 1128  . pravastatin (PRAVACHOL) tablet 40 mg  40 mg Oral q1800 Elwin Mocha, MD   40 mg at 04/02/17 1704  . rOPINIRole (REQUIP) tablet 4 mg  4 mg Oral BID Elwin Mocha, MD   4 mg at 04/03/17 0825  . senna-docusate (Senokot-S) tablet 2 tablet  2 tablet Oral BID Elgergawy, Silver Huguenin, MD   2 tablet at 04/03/17 0825     Discharge Medications: Please see discharge summary for a list of discharge medications. START taking these medications   Details  amLODipine (NORVASC) 10 MG tablet Take 1 tablet (10 mg total) by mouth daily. Qty: 30 tablet, Refills: 0    bisacodyl (DULCOLAX) 5 MG EC tablet Take 1 tablet (5 mg total) by mouth daily as needed for moderate constipation. Qty: 30 tablet, Refills: 0    camphor-menthol (SARNA) lotion Apply topically as needed for itching. Qty: 222 mL, Refills: 0    cefUROXime (CEFTIN) 500 MG tablet Take 1 tablet (500 mg total) by mouth daily. Qty: 5 tablet, Refills: 0    furosemide (LASIX) 20 MG tablet  Take 1 tablet (20 mg total) by mouth daily. Qty: 30 tablet, Refills: 0    oxyCODONE (OXY IR/ROXICODONE) 5 MG immediate release tablet Take 1 tablet (5 mg total) by mouth every 4 (four) hours as needed for moderate pain. Qty: 10 tablet,  Refills: 0    sodium chloride (OCEAN) 0.65 % SOLN nasal spray Place 1 spray into both nostrils as needed for congestion (nose irritation). Refills: 0          CONTINUE these medications which have NOT CHANGED   Details  apixaban (ELIQUIS) 5 MG TABS tablet Take 2 tablets (10 mg total) by mouth 2 (two) times daily. Then on 03/17/17, take 1 tabet (5 mg) two times daily. Qty: 69 tablet, Refills: 0    colchicine 0.6 MG tablet Take 0.6 mg by mouth daily.    cyclobenzaprine (FLEXERIL) 10 MG tablet Take 10 mg by mouth 2 (two) times daily.     lovastatin (MEVACOR) 40 MG tablet Take 40 mg by mouth daily.     omeprazole (PRILOSEC) 20 MG capsule Take 20 mg by mouth daily.    rOPINIRole (REQUIP) 4 MG tablet Take 4 mg by mouth 2 (two) times daily.     allopurinol (ZYLOPRIM) 100 MG tablet Take 100 mg by mouth daily.    ergocalciferol (VITAMIN D2) 50000 UNITS capsule Take 50,000 Units by mouth once a week. Patient takes on Fridays    febuxostat (ULORIC) 40 MG tablet Take 40 mg by mouth daily.    insulin NPH Human (HUMULIN N,NOVOLIN N) 100 UNIT/ML injection Inject 15-75 Units into the skin See admin instructions. Use 65 units every morning then use 15 units at lunch then use 75 units at dinner    insulin regular (NOVOLIN R,HUMULIN R) 100 units/mL injection Inject 30 Units into the skin 3 (three) times daily before meals.         STOP taking these medications     gabapentin (NEURONTIN) 600 MG tablet      HYDROcodone-acetaminophen (NORCO) 7.5-325 MG tablet      nabumetone (RELAFEN) 750 MG tablet      gabapentin (NEURONTIN) 300 MG capsule      lisinopril (PRINIVIL,ZESTRIL) 5 MG tablet      Triamcinolone Acetonide  (TRIAMCINOLONE 0.1 % CREAM : EUCERIN) CREA       Relevant Imaging Results:  Relevant Lab Results:   Additional Information SS#: [id number]  Jorge Ny, LCSW

## 2017-04-04 ENCOUNTER — Inpatient Hospital Stay (HOSPITAL_COMMUNITY): Payer: PPO

## 2017-04-04 DIAGNOSIS — Z794 Long term (current) use of insulin: Secondary | ICD-10-CM

## 2017-04-04 DIAGNOSIS — R0603 Acute respiratory distress: Secondary | ICD-10-CM | POA: Diagnosis not present

## 2017-04-04 DIAGNOSIS — N12 Tubulo-interstitial nephritis, not specified as acute or chronic: Secondary | ICD-10-CM | POA: Diagnosis not present

## 2017-04-04 DIAGNOSIS — A4151 Sepsis due to Escherichia coli [E. coli]: Secondary | ICD-10-CM | POA: Diagnosis not present

## 2017-04-04 DIAGNOSIS — E119 Type 2 diabetes mellitus without complications: Secondary | ICD-10-CM

## 2017-04-04 DIAGNOSIS — I5033 Acute on chronic diastolic (congestive) heart failure: Secondary | ICD-10-CM | POA: Diagnosis not present

## 2017-04-04 DIAGNOSIS — W19XXXA Unspecified fall, initial encounter: Secondary | ICD-10-CM

## 2017-04-04 DIAGNOSIS — N179 Acute kidney failure, unspecified: Secondary | ICD-10-CM | POA: Diagnosis not present

## 2017-04-04 DIAGNOSIS — A419 Sepsis, unspecified organism: Secondary | ICD-10-CM | POA: Diagnosis not present

## 2017-04-04 DIAGNOSIS — R7881 Bacteremia: Secondary | ICD-10-CM | POA: Diagnosis not present

## 2017-04-04 DIAGNOSIS — I10 Essential (primary) hypertension: Secondary | ICD-10-CM

## 2017-04-04 DIAGNOSIS — R109 Unspecified abdominal pain: Secondary | ICD-10-CM | POA: Diagnosis not present

## 2017-04-04 DIAGNOSIS — R6520 Severe sepsis without septic shock: Secondary | ICD-10-CM

## 2017-04-04 LAB — CBC
HCT: 34.9 % — ABNORMAL LOW (ref 36.0–46.0)
Hemoglobin: 10.9 g/dL — ABNORMAL LOW (ref 12.0–15.0)
MCH: 29.4 pg (ref 26.0–34.0)
MCHC: 31.2 g/dL (ref 30.0–36.0)
MCV: 94.1 fL (ref 78.0–100.0)
Platelets: 308 10*3/uL (ref 150–400)
RBC: 3.71 MIL/uL — ABNORMAL LOW (ref 3.87–5.11)
RDW: 15.1 % (ref 11.5–15.5)
WBC: 10.9 10*3/uL — ABNORMAL HIGH (ref 4.0–10.5)

## 2017-04-04 LAB — BLOOD GAS, ARTERIAL
Acid-Base Excess: 6.1 mmol/L — ABNORMAL HIGH (ref 0.0–2.0)
Bicarbonate: 30.3 mmol/L — ABNORMAL HIGH (ref 20.0–28.0)
Drawn by: 405301
FIO2: 50
O2 Saturation: 97.2 %
Patient temperature: 98.6
pCO2 arterial: 45.7 mmHg (ref 32.0–48.0)
pH, Arterial: 7.437 (ref 7.350–7.450)
pO2, Arterial: 87.1 mmHg (ref 83.0–108.0)

## 2017-04-04 LAB — GLUCOSE, CAPILLARY
Glucose-Capillary: 198 mg/dL — ABNORMAL HIGH (ref 65–99)
Glucose-Capillary: 238 mg/dL — ABNORMAL HIGH (ref 65–99)
Glucose-Capillary: 270 mg/dL — ABNORMAL HIGH (ref 65–99)
Glucose-Capillary: 255 mg/dL — ABNORMAL HIGH (ref 65–99)

## 2017-04-04 LAB — CULTURE, BLOOD (ROUTINE X 2)
Culture: NO GROWTH
Special Requests: ADEQUATE

## 2017-04-04 LAB — BASIC METABOLIC PANEL
Anion gap: 8 (ref 5–15)
BUN: 30 mg/dL — ABNORMAL HIGH (ref 6–20)
CO2: 29 mmol/L (ref 22–32)
Calcium: 8.7 mg/dL — ABNORMAL LOW (ref 8.9–10.3)
Chloride: 102 mmol/L (ref 101–111)
Creatinine, Ser: 1.6 mg/dL — ABNORMAL HIGH (ref 0.44–1.00)
GFR calc Af Amer: 36 mL/min — ABNORMAL LOW (ref >60)
GFR calc non Af Amer: 31 mL/min — ABNORMAL LOW (ref >60)
Glucose, Bld: 233 mg/dL — ABNORMAL HIGH (ref 65–99)
Potassium: 3.9 mmol/L (ref 3.5–5.1)
Sodium: 139 mmol/L (ref 135–145)

## 2017-04-04 MED ORDER — FLUTICASONE PROPIONATE 50 MCG/ACT NA SUSP
2.0000 | Freq: Every day | NASAL | Status: DC
  Administered 2017-04-05 – 2017-04-06 (×3): 2 via NASAL
  Filled 2017-04-04 (×2): qty 16

## 2017-04-04 MED ORDER — GUAIFENESIN ER 600 MG PO TB12
600.0000 mg | ORAL_TABLET | Freq: Two times a day (BID) | ORAL | Status: DC
  Administered 2017-04-05 – 2017-04-07 (×6): 600 mg via ORAL
  Filled 2017-04-04 (×6): qty 1

## 2017-04-04 MED ORDER — POLYVINYL ALCOHOL 1.4 % OP SOLN
2.0000 [drp] | OPHTHALMIC | Status: DC | PRN
  Filled 2017-04-04: qty 15

## 2017-04-04 MED ORDER — CAMPHOR-MENTHOL 0.5-0.5 % EX LOTN
TOPICAL_LOTION | CUTANEOUS | Status: DC | PRN
  Filled 2017-04-04 (×2): qty 222

## 2017-04-04 MED ORDER — LIP MEDEX EX OINT: 1.0000 "application " | TOPICAL_OINTMENT | CUTANEOUS | Status: DC | PRN

## 2017-04-04 MED ORDER — BLISTEX MEDICATED EX OINT
TOPICAL_OINTMENT | CUTANEOUS | Status: DC | PRN
  Filled 2017-04-04: qty 6.3

## 2017-04-04 MED ORDER — SALINE SPRAY 0.65 % NA SOLN
1.0000 | NASAL | Status: DC | PRN
  Filled 2017-04-04: qty 44

## 2017-04-04 NOTE — Progress Notes (Signed)
Physical Therapy Treatment Patient Details Name: Jean Mcclain MRN: [id number] DOB: Mar 09, 1946 Today's Date: 04/04/2017    History of Present Illness  Patient is a 71 yo female admitted 03/30/17 after fall downstairs, she sustained multiple rib fractures, as well workup significant for sepsis secondary to pyelonephritis/bacteremia.past medical history of diabetes, reflux, sleep apnea, osteopenia.    PT Comments    Patient is making gradual progress toward mobility goals. Required +2 assist for OOB mobility and max A for bed mobility. Continue to progress as tolerated with anticipated d/c to SNF for further skilled PT services.     Follow Up Recommendations  SNF;Supervision/Assistance - 24 hour     Equipment Recommendations  Wheelchair (measurements PT);Wheelchair cushion (measurements PT)    Recommendations for Other Services       Precautions / Restrictions Precautions Precautions: Fall Restrictions Weight Bearing Restrictions: No    Mobility  Bed Mobility Overal bed mobility: Needs Assistance Bed Mobility: Supine to Sit;Rolling Rolling: Max assist   Supine to sit: +2 for physical assistance;Max assist     General bed mobility comments: assist to roll for bed pan with use of rail; assist to bring bilat LE to EOB and elevate trunk into sitting; increased assist needed due to body habitus and rib pain  Transfers Overall transfer level: Needs assistance Equipment used: Rolling walker (2 wheeled) Transfers: Sit to/from [facility] Sit to Stand: +2 physical assistance;Mod assist Stand pivot transfers: Max assist;+2 physical assistance       General transfer comment: assist to power up into standing and to pivot from EOB to recliner; cues for sequencing and posture; hips guided to chair when descending  Ambulation/Gait             General Gait Details: not attempted this session   Stairs            Wheelchair Mobility    Modified  [place] (Stroke Patients Only)       Balance Overall balance assessment: Needs assistance Sitting-balance support: No upper extremity supported;Feet supported Sitting balance-Leahy Scale: Fair     Standing balance support: Bilateral upper extremity supported Standing balance-Leahy Scale: Poor                              Cognition Arousal/Alertness: Awake/alert Behavior During Therapy: WFL for tasks assessed/performed;Anxious Overall Cognitive Status: Within Functional Limits for tasks assessed                                        Exercises      General Comments General comments (skin integrity, edema, etc.): VSS      Pertinent Vitals/Pain Pain Assessment: Faces Faces Pain Scale: Hurts even more Pain Location: Lt ribcage Pain Descriptors / Indicators: Grimacing;Guarding;Moaning;Sore (Yelling; Increasing RR and dyspnea) Pain Intervention(s): Limited activity within patient's tolerance;Monitored during session;Premedicated before session;Repositioned    Home Living                      Prior Function            PT Goals (current goals can now be found in the care plan section) Acute Rehab PT Goals Patient Stated Goal: Decrease pain PT Goal Formulation: With patient Time For Goal Achievement: 04/16/17 Potential to Achieve Goals: Fair Progress towards PT goals: Progressing toward goals    Frequency  Min 3X/week      PT Plan Current plan remains appropriate    Co-evaluation              AM-PAC PT "6 Clicks" Daily Activity  Outcome Measure  Difficulty turning over in bed (including adjusting bedclothes, sheets and blankets)?: Total Difficulty moving from lying on back to sitting on the side of the bed? : Total Difficulty sitting down on and standing up from a chair with arms (e.g., wheelchair, bedside commode, etc,.)?: Total Help needed moving to and from a bed to chair (including a wheelchair)?: A Lot Help  needed walking in hospital room?: A Lot Help needed climbing 3-5 steps with a railing? : Total 6 Click Score: 8    End of Session Equipment Utilized During Treatment: Oxygen;Gait belt Activity Tolerance: Patient tolerated treatment well Patient left: with call bell/phone within reach;in chair Nurse Communication: Mobility status PT Visit Diagnosis: Difficulty in walking, not elsewhere classified (R26.2);Muscle weakness (generalized) (M62.81);Pain Pain - Right/Left: Left Pain - part of body:  (Rib cage)     Time: 0925-1010 PT Time Calculation (min) (ACUTE ONLY): 45 min  Charges:  $Therapeutic Activity: 38-52 mins                    G Codes:       Earney Navy, PTA Pager: [phone]     Darliss Cheney 04/04/2017, 10:38 AM

## 2017-04-04 NOTE — Progress Notes (Signed)
Inpatient Diabetes Program Recommendations  AACE/ADA: New Consensus Statement on Inpatient Glycemic Control (2015)  Target Ranges:  Prepandial:   less than 140 mg/dL      Peak postprandial:   less than 180 mg/dL (1-2 hours)      Critically ill patients:  140 - 180 mg/dL   Lab Results  Component Value Date   GLUCAP 198 (H) 04/04/2017   HGBA1C 7.1 (H) 03/11/2017    Review of Glycemic Control Results for Jean Mcclain, Jean Mcclain (MRN [id number]) as of 04/04/2017 10:26  Ref. Range 04/03/2017 07:27 04/03/2017 11:56 04/03/2017 16:04 04/03/2017 21:44 04/04/2017 07:34  Glucose-Capillary Latest Ref Range: 65 - 99 mg/dL 151 (H) 227 (H) 221 (H) 365 (H) 198 (H)   Inpatient Diabetes Program Recommendations:  Noted postprandial CBGs elevated. Please consider Novolog 3 units tid meal coverage if eats 50%.  Thank you, Nani Gasser. Saban Heinlen, RN, MSN, CDE  Diabetes Coordinator Inpatient Glycemic Control Team Team Pager [phone] (8am-5pm) 04/04/2017 10:52 AM

## 2017-04-04 NOTE — Progress Notes (Deleted)
PROGRESS NOTE    Jean Mcclain  ONG:[id number] DOB: 1946-02-20 DOA: 03/30/2017 PCP: Jean Battles, MD   Chief Complaint  Patient presents with  . Fall    Brief Narrative:  HPI on 03/30/2017 by Dr. Benjaman Lobe Jean Mcclain is a 70 y.o. female  with past medical history of diabetes, reflux, sleep apnea, osteopenia presented to the emergency room after fall downstairs. Patient states she fell and has pain on the left side. Came to emergency room for evaluation due to concern for possible injury. Has some CP post fall. Denies head injury with fall.  Interim history  Being treated for sepsis secondary to [facility] UTI/bacteremia. Needs SNF  Assessment & Plan   Severe sepsis secondary to Escherichia coli bacteremia/UTI/pyelonephritis -Patient presented with fever, tachycardia, tachypnea, leukocytosis and encephalopathy, acute kidney injury -Blood cultures6/2/18 show 1/2 Escherichia coli  -UA positive for UTI -Urine culture showed no growth however was obtained more than 24 hours after antibiotic initiation -Procalcitonin trending downward, lactic acid within normal limits -Patient was initially placed on vancomycin and Zosyn which have been transitioned to ceftriaxone -Will repeat blood cultures today -leukocytosis improving   Acute hypoxic/hypercarbic respiratory failure/obstructive sleep apnea -Possibly secondary to sepsis and volume overload -Patient did require BiPAP upon admission however over the last 24-48 hours patient has done well on oxygen via nasal cannula as well as nonrebreather. -PCCM consulted and appreciated, was managing BiPAP -Will continue to wean as possible -Patient states she does not like using oxygen -Patient does not use CPAP at home  Elevated troponin -Likely demand ischemia in the setting of severe sepsis and respiratory failure -Echocardiogram done on 03/11/2017, listed below -Troponin peaked to 0.18, and trended downward -Currently chest  pain-free  DVT -Continue  Eliquis  Acute encephalopathy -Resolved, patient appears to be back to baseline -CT head with no acute findings -Suspect secondary to sepsis as well as hypercapnia/hypoxia  Multiple left rib fractures secondary to recent fall -X-ray shows significant rib fractures from ribs 3 through 8 -Encourage incentive spirometry and ambulation -Continue pain control as needed  Essential hypertension -Lisinopril held secondary to acute kidney injury -Continue amlodipine and IV hydralazine as needed  Acute on chronic diastolic heart failure -Diuresis initially held due to worsening renal function -Echocardiogram on 03/11/2017 showed an EF of 44-01%, grade 1 diastolic dysfunction -Currently appears to be euvolemic -Patient did have some lower extremity edema and was given 1 dose of Lasix  Acute kidney injury on CKD, stage III -Likely secondary to sepsis -Renal ultrasound showed increased renal echogenicity consistent with chronic renal disease, no hydronephrosis or acute renal abnormality -Creatinine peaked at 2.6, baseline approximately 1.8 -Currently creatinine 1.6 -Continue to monitor BMP  Gout -Appears stable, continue allopurinol, colchicine, uloric  Diabetes mellitus, type II -Patient uses NPH at home -Continue Lantus, insulin sliding scale, CBG monitoring  Recent left ankle fracture -Continue using CAM boot and follow-up with orthopedics as an outpatient -PT consulted and recommended SNF  GERD -Continue PPI  Hyperlipidemia -Continue statin  Restless leg syndrome -Continue Requip  Dermatitis -Will order sarna  Chronic normocytic anemia -Hemoglobin appears to be at baseline, continue to monitor CBC  DVT Prophylaxis  Eliquis  Code Status: Full  Family Communication: None at bedside  Disposition Plan: Admitted. Transfer to stepdown. Needs SNF at discharge- possibly in 1-2 days  Consultants PCCM  Procedures  Abdominal  ultrasound  Antibiotics   Anti-infectives    Start     Dose/Rate Route Frequency Ordered Stop   04/02/17  1130  cefTRIAXone (ROCEPHIN) 2 g in dextrose 5 % 50 mL IVPB     2 g 100 mL/hr over 30 Minutes Intravenous Every 24 hours 04/02/17 1023     04/01/17 1300  vancomycin (VANCOCIN) 1,500 mg in sodium chloride 0.9 % 500 mL IVPB  Status:  Discontinued     1,500 mg 250 mL/hr over 120 Minutes Intravenous Every 48 hours 03/30/17 1216 03/31/17 0852   03/30/17 2100  piperacillin-tazobactam (ZOSYN) IVPB 3.375 g  Status:  Discontinued     3.375 g 12.5 mL/hr over 240 Minutes Intravenous Every 8 hours 03/30/17 1216 04/02/17 1023   03/30/17 1230  vancomycin (VANCOCIN) 2,000 mg in sodium chloride 0.9 % 500 mL IVPB     2,000 mg 250 mL/hr over 120 Minutes Intravenous  Once 03/30/17 1202 03/30/17 1535   03/30/17 1200  piperacillin-tazobactam (ZOSYN) IVPB 3.375 g     3.375 g 100 mL/hr over 30 Minutes Intravenous  Once 03/30/17 1155 03/30/17 1427   03/30/17 1200  vancomycin (VANCOCIN) IVPB 1000 mg/200 mL premix  Status:  Discontinued     1,000 mg 200 mL/hr over 60 Minutes Intravenous  Once 03/30/17 1155 03/30/17 1202      Subjective:   Jean Mcclain seen and examined today. Patient continues to complain of pain. Also complains of itchy skin on her back as well as extremities. Denies chest pain, current shortness of breath. Does not like using her oxygen. Denies any abdominal pain, nausea or vomiting, diarrhea or constipation.  Objective:   Vitals:   04/04/17 0800 04/04/17 0837 04/04/17 0900 04/04/17 1000  BP: (!) 127/95  (!) 143/80 (!) 150/105  Pulse:      Resp: 18 19 (!) 23 (!) 21  Temp:      TempSrc:      SpO2: 95% 96% 95% 95%  Weight:      Height:        Intake/Output Summary (Last 24 hours) at 04/04/17 1049 Last data filed at 04/04/17 0500  Gross per 24 hour  Intake              650 ml  Output             1251 ml  Net             -601 ml   Filed Weights   04/02/17 0356 04/03/17  0300 04/04/17 0400  Weight: 112.7 kg (248 lb 7.3 oz) 110.4 kg (243 lb 6.2 oz) 109.4 kg (241 lb 2.9 oz)   Exam  General: Well developed, well nourished, NAD, appears stated age  HEENT: NCAT, mucous membranes moist. NRB in place  Cardiovascular: S1 S2 auscultated, no rubs, murmurs or gallops. Regular rate and rhythm.  Respiratory: Diminished breath sounds, no wheezing or rhonchi noted  Chest wall: TTP to left side  Abdomen: Soft, obese, nontender, nondistended, + bowel sounds  MSK: no left sided CVA tenderness  Extremities: warm dry without cyanosis clubbing or edema  Neuro: AAOx3, nonfocal  Psych: Appropriate    Data Reviewed: I have personally reviewed following labs and imaging studies  CBC:  Recent Labs Lab 03/30/17 1128 03/31/17 0756 04/01/17 0207 04/02/17 0218 04/03/17 0218 04/04/17 0553  WBC 14.1* 14.4* 10.4 10.5 12.1* 10.9*  NEUTROABS 12.0*  --   --   --   --   --   HGB 11.0* 10.9* 9.7* 9.7* 10.7* 10.9*  HCT 35.5* 36.5 32.7* 32.4* 34.8* 34.9*  MCV 95.2 97.9 98.2 96.7 95.1 94.1  PLT 227 249  247 253 298 397   Basic Metabolic Panel:  Recent Labs Lab 03/30/17 1128  04/01/17 0207 04/01/17 1520 04/02/17 0218 04/03/17 0218 04/04/17 0553  NA 134*  < > 143 138 141 140 139  K 4.8  < > 4.5 4.5 4.4 4.3 3.9  CL 101  < > 104 102 103 102 102  CO2 23  < > 28 27 30 30 29   GLUCOSE 282*  < > 132* 169* 126* 160* 233*  BUN 31*  < > 39* 39* 36* 33* 30*  CREATININE 2.31*  < > 2.66* 2.31* 2.18* 1.80* 1.60*  CALCIUM 8.1*  < > 8.0* 8.4* 8.5* 8.7* 8.7*  MG 1.8  --  2.2  --  2.2  --   --   PHOS 2.9  --   --   --   --   --   --   < > = values in this interval not displayed. GFR: Estimated Creatinine Clearance: 36.2 mL/min (A) (by C-G formula based on SCr of 1.6 mg/dL (H)). Liver Function Tests:  Recent Labs Lab 03/30/17 1128  AST 24  ALT 23  ALKPHOS 97  BILITOT 0.7  PROT 6.2*  ALBUMIN 2.9*   No results for input(s): LIPASE, AMYLASE in the last 168 hours. No  results for input(s): AMMONIA in the last 168 hours. Coagulation Profile:  Recent Labs Lab 03/30/17 1128  INR 1.65   Cardiac Enzymes:  Recent Labs Lab 03/31/17 0756 03/31/17 1234 03/31/17 1849  TROPONINI 0.18* 0.05* 0.03*   BNP (last 3 results) No results for input(s): PROBNP in the last 8760 hours. HbA1C: No results for input(s): HGBA1C in the last 72 hours. CBG:  Recent Labs Lab 04/03/17 0727 04/03/17 1156 04/03/17 1604 04/03/17 2144 04/04/17 0734  GLUCAP 151* 227* 221* 365* 198*   Lipid Profile: No results for input(s): CHOL, HDL, LDLCALC, TRIG, CHOLHDL, LDLDIRECT in the last 72 hours. Thyroid Function Tests: No results for input(s): TSH, T4TOTAL, FREET4, T3FREE, THYROIDAB in the last 72 hours. Anemia Panel: No results for input(s): VITAMINB12, FOLATE, FERRITIN, TIBC, IRON, RETICCTPCT in the last 72 hours. Urine analysis:    Component Value Date/Time   COLORURINE YELLOW 03/30/2017 1718   APPEARANCEUR CLOUDY (A) 03/30/2017 1718   LABSPEC >1.030 (H) 03/30/2017 1718   PHURINE 6.0 03/30/2017 1718   GLUCOSEU NEGATIVE 03/30/2017 1718   HGBUR LARGE (A) 03/30/2017 1718   BILIRUBINUR NEGATIVE 03/30/2017 1718   KETONESUR NEGATIVE 03/30/2017 1718   PROTEINUR >300 (A) 03/30/2017 1718   UROBILINOGEN 0.2 10/01/2009 1740   NITRITE POSITIVE (A) 03/30/2017 1718   LEUKOCYTESUR MODERATE (A) 03/30/2017 1718   Sepsis Labs: @LABRCNTIP (procalcitonin:4,lacticidven:4)  ) Recent Results (from the past 240 hour(s))  Culture, blood (Routine x 2)     Status: Abnormal   Collection Time: 03/30/17 12:28 PM  Result Value Ref Range Status   Specimen Description BLOOD RIGHT FOREARM  Final   Special Requests   Final    BOTTLES DRAWN AEROBIC AND ANAEROBIC Blood Culture adequate volume   Culture  Setup Time   Final    GRAM NEGATIVE RODS ANAEROBIC BOTTLE ONLY CRITICAL RESULT CALLED TO, READ BACK BY AND VERIFIED WITH: PHARMD T STONE [id number] MLM    Culture ESCHERICHIA COLI (A)   Final   Report Status 04/02/2017 FINAL  Final   Organism ID, Bacteria ESCHERICHIA COLI  Final      Susceptibility   Escherichia coli - MIC*    AMPICILLIN >=32 RESISTANT Resistant     CEFAZOLIN <=  4 SENSITIVE Sensitive     CEFEPIME <=1 SENSITIVE Sensitive     CEFTAZIDIME <=1 SENSITIVE Sensitive     CEFTRIAXONE <=1 SENSITIVE Sensitive     CIPROFLOXACIN <=0.25 SENSITIVE Sensitive     GENTAMICIN <=1 SENSITIVE Sensitive     IMIPENEM <=0.25 SENSITIVE Sensitive     TRIMETH/SULFA <=20 SENSITIVE Sensitive     AMPICILLIN/SULBACTAM 16 INTERMEDIATE Intermediate     PIP/TAZO <=4 SENSITIVE Sensitive     Extended ESBL NEGATIVE Sensitive     * ESCHERICHIA COLI  Blood Culture ID Panel (Reflexed)     Status: Abnormal   Collection Time: 03/30/17 12:28 PM  Result Value Ref Range Status   Enterococcus species NOT DETECTED NOT DETECTED Final   Listeria monocytogenes NOT DETECTED NOT DETECTED Final   Staphylococcus species NOT DETECTED NOT DETECTED Final   Staphylococcus aureus NOT DETECTED NOT DETECTED Final   Streptococcus species NOT DETECTED NOT DETECTED Final   Streptococcus agalactiae NOT DETECTED NOT DETECTED Final   Streptococcus pneumoniae NOT DETECTED NOT DETECTED Final   Streptococcus pyogenes NOT DETECTED NOT DETECTED Final   Acinetobacter baumannii NOT DETECTED NOT DETECTED Final   Enterobacteriaceae species DETECTED (A) NOT DETECTED Final    Comment: Enterobacteriaceae represent a large family of gram-negative bacteria, not a single organism. CRITICAL RESULT CALLED TO, READ BACK BY AND VERIFIED WITH: PHARMD T STONE [id number] MLM    Enterobacter cloacae complex NOT DETECTED NOT DETECTED Final   Escherichia coli DETECTED (A) NOT DETECTED Final    Comment: CRITICAL RESULT CALLED TO, READ BACK BY AND VERIFIED WITH: PHARMD T STONE [id number] MLM    Klebsiella oxytoca NOT DETECTED NOT DETECTED Final   Klebsiella pneumoniae NOT DETECTED NOT DETECTED Final   Proteus species NOT DETECTED  NOT DETECTED Final   Serratia marcescens NOT DETECTED NOT DETECTED Final   Carbapenem resistance NOT DETECTED NOT DETECTED Final   Haemophilus influenzae NOT DETECTED NOT DETECTED Final   Neisseria meningitidis NOT DETECTED NOT DETECTED Final   Pseudomonas aeruginosa NOT DETECTED NOT DETECTED Final   Candida albicans NOT DETECTED NOT DETECTED Final   Candida glabrata NOT DETECTED NOT DETECTED Final   Candida krusei NOT DETECTED NOT DETECTED Final   Candida parapsilosis NOT DETECTED NOT DETECTED Final   Candida tropicalis NOT DETECTED NOT DETECTED Final  Culture, blood (Routine x 2)     Status: None (Preliminary result)   Collection Time: 03/30/17  1:11 PM  Result Value Ref Range Status   Specimen Description BLOOD RIGHT HAND  Final   Special Requests   Final    BOTTLES DRAWN AEROBIC AND ANAEROBIC Blood Culture adequate volume   Culture NO GROWTH 4 DAYS  Final   Report Status PENDING  Incomplete  MRSA PCR Screening     Status: None   Collection Time: 03/31/17  5:32 AM  Result Value Ref Range Status   MRSA by PCR NEGATIVE NEGATIVE Final    Comment:        The GeneXpert MRSA Assay (FDA approved for NASAL specimens only), is one component of a comprehensive MRSA colonization surveillance program. It is not intended to diagnose MRSA infection nor to guide or monitor treatment for MRSA infections.   Culture, Urine     Status: None   Collection Time: 04/01/17 11:29 AM  Result Value Ref Range Status   Specimen Description URINE, CATHETERIZED  Final   Special Requests NONE  Final   Culture NO GROWTH  Final   Report Status 04/02/2017 FINAL  Final      Radiology Studies: No results found.   Scheduled Meds: . allopurinol  100 mg Oral Daily  . amLODipine  10 mg Oral Daily  . apixaban  5 mg Oral BID  . colchicine  0.6 mg Oral Daily  . febuxostat  40 mg Oral Daily  . insulin aspart  0-9 Units Subcutaneous TID WC  . insulin glargine  20 Units Subcutaneous Daily  . mouth rinse   15 mL Mouth Rinse BID  . pantoprazole  40 mg Oral Daily  . pneumococcal 23 valent vaccine  0.5 mL Intramuscular Tomorrow-1000  . polyethylene glycol  17 g Oral BID  . pravastatin  40 mg Oral q1800  . rOPINIRole  4 mg Oral BID  . senna-docusate  2 tablet Oral BID   Continuous Infusions: . cefTRIAXone (ROCEPHIN)  IV Stopped (04/03/17 1300)     LOS: 5 days   Time Spent in minutes   35 minutes  Angelena Sand D.O. on 04/04/2017 at 10:49 AM  Between 7am to 7pm - Pager - [phone]  After 7pm go to [email or web address] - password [email or web address]  And look for the night coverage person covering for me after hours  [facility] Office  [phone]

## 2017-04-04 NOTE — Progress Notes (Addendum)
PROGRESS NOTE    Jean Mcclain  RDE:[id number] DOB: 1946/08/10 DOA: 03/30/2017 PCP: Leanna Battles, MD   Chief Complaint  Patient presents with  . Fall    Brief Narrative:  HPI on 03/30/2017 by Dr. Benjaman Lobe BLANNIE [name] is a 71 y.o. female  with past medical history of diabetes, reflux, sleep apnea, osteopenia presented to the emergency room after fall downstairs. Patient states she fell and has pain on the left side. Came to emergency room for evaluation due to concern for possible injury. Has some CP post fall. Denies head injury with fall.  Interim history  Being treated for sepsis secondary to [facility] UTI/bacteremia. Needs SNF  Assessment & Plan   Severe sepsis secondary to Escherichia coli bacteremia/UTI/pyelonephritis -Patient presented with fever, tachycardia, tachypnea, leukocytosis and encephalopathy, acute kidney injury -Blood cultures show Escherichia coli -UA positive for UTI -Urine culture showed no growth however was obtained more than 24 hours after antibiotic initiation -Procalcitonin trending downward, lactic acid within normal limits -Patient was initially placed on vancomycin and Zosyn which have been transitioned to ceftriaxone  Acute hypoxic/hypercarbic respiratory failure/obstructive sleep apnea -Possibly secondary to sepsis and volume overload -Patient did require BiPAP upon admission however over the last 24-48 hours patient has done well on oxygen via nasal cannula as well as nonrebreather. -PCCM consulted and appreciated, was managing BiPAP -Will continue to wean as possible -Patient states she does not like using oxygen -Patient does not use CPAP at home  Elevated troponin -Likely demand ischemia in the setting of severe sepsis and respiratory failure -Echocardiogram done on 03/11/2017, listed below -Troponin peaked to 0.18, and trended downward -Currently chest pain-free  DVT -Continue  Eliquis  Acute encephalopathy -Resolved, patient  appears to be back to baseline -CT head with no acute findings -Suspect secondary to sepsis as well as hypercapnia/hypoxia  Multiple left rib fractures secondary to recent fall -X-ray shows significant rib fractures from ribs 3 through 8 -Encourage incentive spirometry and ambulation -Continue pain control as needed  Essential hypertension -Lisinopril held secondary to acute kidney injury -Continue amlodipine and IV hydralazine as needed  Acute on chronic diastolic heart failure -Diuresis initially held due to worsening renal function -Echocardiogram on 03/11/2017 showed an EF of 63-14%, grade 1 diastolic dysfunction -Currently appears to be euvolemic -Patient did have some lower extremity edema and was given 1 dose of Lasix  Acute kidney injury on CKD, stage III -Likely secondary to sepsis -Renal ultrasound showed increased renal echogenicity consistent with chronic renal disease, no hydronephrosis or acute renal abnormality -Creatinine peaked at 2.6, baseline approximately 1.8 -Currently creatinine 1.6 -Continue to monitor BMP  Gout -Appears stable, continue allopurinol, colchicine, uloric  Diabetes mellitus, type II -Patient uses NPH at home -Continue Lantus, insulin sliding scale, CBG monitoring  Recent left ankle fracture -Continue using CAM boot and follow-up with orthopedics as an outpatient -PT consulted and recommended SNF  GERD -Continue PPI  Hyperlipidemia -Continue statin  Restless leg syndrome -Continue Requip  Dermatitis -Will order sarna  Acute normocytic anemia -Hemoglobin currently stable, continue to monitor CBC  DVT Prophylaxis  Eliquis  Code Status: Full  Family Communication: None at bedside  Disposition Plan: Admitted. Transfer to stepdown. Needs SNF at discharge- possibly in 1-2 days  Consultants PCCM  Procedures  Abdominal ultrasound  Antibiotics   Anti-infectives    Start     Dose/Rate Route Frequency Ordered Stop    04/02/17 1130  cefTRIAXone (ROCEPHIN) 2 g in dextrose 5 % 50 mL IVPB  2 g 100 mL/hr over 30 Minutes Intravenous Every 24 hours 04/02/17 1023     04/01/17 1300  vancomycin (VANCOCIN) 1,500 mg in sodium chloride 0.9 % 500 mL IVPB  Status:  Discontinued     1,500 mg 250 mL/hr over 120 Minutes Intravenous Every 48 hours 03/30/17 1216 03/31/17 0852   03/30/17 2100  piperacillin-tazobactam (ZOSYN) IVPB 3.375 g  Status:  Discontinued     3.375 g 12.5 mL/hr over 240 Minutes Intravenous Every 8 hours 03/30/17 1216 04/02/17 1023   03/30/17 1230  vancomycin (VANCOCIN) 2,000 mg in sodium chloride 0.9 % 500 mL IVPB     2,000 mg 250 mL/hr over 120 Minutes Intravenous  Once 03/30/17 1202 03/30/17 1535   03/30/17 1200  piperacillin-tazobactam (ZOSYN) IVPB 3.375 g     3.375 g 100 mL/hr over 30 Minutes Intravenous  Once 03/30/17 1155 03/30/17 1427   03/30/17 1200  vancomycin (VANCOCIN) IVPB 1000 mg/200 mL premix  Status:  Discontinued     1,000 mg 200 mL/hr over 60 Minutes Intravenous  Once 03/30/17 1155 03/30/17 1202      Subjective:   Linwood Dibbles Hobbs seen and examined today. Patient continues to complain of pain. Also complains of itchy skin on her back as well as extremities. Denies chest pain, current shortness of breath. Does not like using her oxygen. Denies any abdominal pain, nausea or vomiting, diarrhea or constipation.  Objective:   Vitals:   04/04/17 0800 04/04/17 0837 04/04/17 0900 04/04/17 1000  BP: (!) 127/95  (!) 143/80 (!) 150/105  Pulse:      Resp: 18 19 (!) 23 (!) 21  Temp:      TempSrc:      SpO2: 95% 96% 95% 95%  Weight:      Height:        Intake/Output Summary (Last 24 hours) at 04/04/17 1024 Last data filed at 04/04/17 0500  Gross per 24 hour  Intake              650 ml  Output             1251 ml  Net             -601 ml   Filed Weights   04/02/17 0356 04/03/17 0300 04/04/17 0400  Weight: 112.7 kg (248 lb 7.3 oz) 110.4 kg (243 lb 6.2 oz) 109.4 kg (241 lb 2.9  oz)   Exam  General: Well developed, well nourished, NAD, appears stated age  HEENT: NCAT, mucous membranes moist. NRB in place  Cardiovascular: S1 S2 auscultated, no rubs, murmurs or gallops. Regular rate and rhythm.  Respiratory: Diminished breath sounds, no wheezing or rhonchi noted  Chest wall: TTP to left side  Abdomen: Soft, obese, nontender, nondistended, + bowel sounds  MSK: no left sided CVA tenderness  Extremities: warm dry without cyanosis clubbing or edema  Neuro: AAOx3, nonfocal  Psych: Appropriate    Data Reviewed: I have personally reviewed following labs and imaging studies  CBC:  Recent Labs Lab 03/30/17 1128 03/31/17 0756 04/01/17 0207 04/02/17 0218 04/03/17 0218 04/04/17 0553  WBC 14.1* 14.4* 10.4 10.5 12.1* 10.9*  NEUTROABS 12.0*  --   --   --   --   --   HGB 11.0* 10.9* 9.7* 9.7* 10.7* 10.9*  HCT 35.5* 36.5 32.7* 32.4* 34.8* 34.9*  MCV 95.2 97.9 98.2 96.7 95.1 94.1  PLT 227 249 247 253 298 269   Basic Metabolic Panel:  Recent Labs Lab 03/30/17 1128  04/01/17  0207 04/01/17 1520 04/02/17 0218 04/03/17 0218 04/04/17 0553  NA 134*  < > 143 138 141 140 139  K 4.8  < > 4.5 4.5 4.4 4.3 3.9  CL 101  < > 104 102 103 102 102  CO2 23  < > 28 27 30 30 29   GLUCOSE 282*  < > 132* 169* 126* 160* 233*  BUN 31*  < > 39* 39* 36* 33* 30*  CREATININE 2.31*  < > 2.66* 2.31* 2.18* 1.80* 1.60*  CALCIUM 8.1*  < > 8.0* 8.4* 8.5* 8.7* 8.7*  MG 1.8  --  2.2  --  2.2  --   --   PHOS 2.9  --   --   --   --   --   --   < > = values in this interval not displayed. GFR: Estimated Creatinine Clearance: 36.2 mL/min (A) (by C-G formula based on SCr of 1.6 mg/dL (H)). Liver Function Tests:  Recent Labs Lab 03/30/17 1128  AST 24  ALT 23  ALKPHOS 97  BILITOT 0.7  PROT 6.2*  ALBUMIN 2.9*   No results for input(s): LIPASE, AMYLASE in the last 168 hours. No results for input(s): AMMONIA in the last 168 hours. Coagulation Profile:  Recent Labs Lab  03/30/17 1128  INR 1.65   Cardiac Enzymes:  Recent Labs Lab 03/31/17 0756 03/31/17 1234 03/31/17 1849  TROPONINI 0.18* 0.05* 0.03*   BNP (last 3 results) No results for input(s): PROBNP in the last 8760 hours. HbA1C: No results for input(s): HGBA1C in the last 72 hours. CBG:  Recent Labs Lab 04/03/17 0727 04/03/17 1156 04/03/17 1604 04/03/17 2144 04/04/17 0734  GLUCAP 151* 227* 221* 365* 198*   Lipid Profile: No results for input(s): CHOL, HDL, LDLCALC, TRIG, CHOLHDL, LDLDIRECT in the last 72 hours. Thyroid Function Tests: No results for input(s): TSH, T4TOTAL, FREET4, T3FREE, THYROIDAB in the last 72 hours. Anemia Panel: No results for input(s): VITAMINB12, FOLATE, FERRITIN, TIBC, IRON, RETICCTPCT in the last 72 hours. Urine analysis:    Component Value Date/Time   COLORURINE YELLOW 03/30/2017 1718   APPEARANCEUR CLOUDY (A) 03/30/2017 1718   LABSPEC >1.030 (H) 03/30/2017 1718   PHURINE 6.0 03/30/2017 1718   GLUCOSEU NEGATIVE 03/30/2017 1718   HGBUR LARGE (A) 03/30/2017 1718   BILIRUBINUR NEGATIVE 03/30/2017 1718   KETONESUR NEGATIVE 03/30/2017 1718   PROTEINUR >300 (A) 03/30/2017 1718   UROBILINOGEN 0.2 10/01/2009 1740   NITRITE POSITIVE (A) 03/30/2017 1718   LEUKOCYTESUR MODERATE (A) 03/30/2017 1718   Sepsis Labs: @LABRCNTIP (procalcitonin:4,lacticidven:4)  ) Recent Results (from the past 240 hour(s))  Culture, blood (Routine x 2)     Status: Abnormal   Collection Time: 03/30/17 12:28 PM  Result Value Ref Range Status   Specimen Description BLOOD RIGHT FOREARM  Final   Special Requests   Final    BOTTLES DRAWN AEROBIC AND ANAEROBIC Blood Culture adequate volume   Culture  Setup Time   Final    GRAM NEGATIVE RODS ANAEROBIC BOTTLE ONLY CRITICAL RESULT CALLED TO, READ BACK BY AND VERIFIED WITH: PHARMD T STONE [id number] MLM    Culture ESCHERICHIA COLI (A)  Final   Report Status 04/02/2017 FINAL  Final   Organism ID, Bacteria ESCHERICHIA COLI  Final        Susceptibility   Escherichia coli - MIC*    AMPICILLIN >=32 RESISTANT Resistant     CEFAZOLIN <=4 SENSITIVE Sensitive     CEFEPIME <=1 SENSITIVE Sensitive     CEFTAZIDIME <=  1 SENSITIVE Sensitive     CEFTRIAXONE <=1 SENSITIVE Sensitive     CIPROFLOXACIN <=0.25 SENSITIVE Sensitive     GENTAMICIN <=1 SENSITIVE Sensitive     IMIPENEM <=0.25 SENSITIVE Sensitive     TRIMETH/SULFA <=20 SENSITIVE Sensitive     AMPICILLIN/SULBACTAM 16 INTERMEDIATE Intermediate     PIP/TAZO <=4 SENSITIVE Sensitive     Extended ESBL NEGATIVE Sensitive     * ESCHERICHIA COLI  Blood Culture ID Panel (Reflexed)     Status: Abnormal   Collection Time: 03/30/17 12:28 PM  Result Value Ref Range Status   Enterococcus species NOT DETECTED NOT DETECTED Final   Listeria monocytogenes NOT DETECTED NOT DETECTED Final   Staphylococcus species NOT DETECTED NOT DETECTED Final   Staphylococcus aureus NOT DETECTED NOT DETECTED Final   Streptococcus species NOT DETECTED NOT DETECTED Final   Streptococcus agalactiae NOT DETECTED NOT DETECTED Final   Streptococcus pneumoniae NOT DETECTED NOT DETECTED Final   Streptococcus pyogenes NOT DETECTED NOT DETECTED Final   Acinetobacter baumannii NOT DETECTED NOT DETECTED Final   Enterobacteriaceae species DETECTED (A) NOT DETECTED Final    Comment: Enterobacteriaceae represent a large family of gram-negative bacteria, not a single organism. CRITICAL RESULT CALLED TO, READ BACK BY AND VERIFIED WITH: PHARMD T STONE [id number] MLM    Enterobacter cloacae complex NOT DETECTED NOT DETECTED Final   Escherichia coli DETECTED (A) NOT DETECTED Final    Comment: CRITICAL RESULT CALLED TO, READ BACK BY AND VERIFIED WITH: PHARMD T STONE [id number] MLM    Klebsiella oxytoca NOT DETECTED NOT DETECTED Final   Klebsiella pneumoniae NOT DETECTED NOT DETECTED Final   Proteus species NOT DETECTED NOT DETECTED Final   Serratia marcescens NOT DETECTED NOT DETECTED Final   Carbapenem resistance  NOT DETECTED NOT DETECTED Final   Haemophilus influenzae NOT DETECTED NOT DETECTED Final   Neisseria meningitidis NOT DETECTED NOT DETECTED Final   Pseudomonas aeruginosa NOT DETECTED NOT DETECTED Final   Candida albicans NOT DETECTED NOT DETECTED Final   Candida glabrata NOT DETECTED NOT DETECTED Final   Candida krusei NOT DETECTED NOT DETECTED Final   Candida parapsilosis NOT DETECTED NOT DETECTED Final   Candida tropicalis NOT DETECTED NOT DETECTED Final  Culture, blood (Routine x 2)     Status: None (Preliminary result)   Collection Time: 03/30/17  1:11 PM  Result Value Ref Range Status   Specimen Description BLOOD RIGHT HAND  Final   Special Requests   Final    BOTTLES DRAWN AEROBIC AND ANAEROBIC Blood Culture adequate volume   Culture NO GROWTH 4 DAYS  Final   Report Status PENDING  Incomplete  MRSA PCR Screening     Status: None   Collection Time: 03/31/17  5:32 AM  Result Value Ref Range Status   MRSA by PCR NEGATIVE NEGATIVE Final    Comment:        The GeneXpert MRSA Assay (FDA approved for NASAL specimens only), is one component of a comprehensive MRSA colonization surveillance program. It is not intended to diagnose MRSA infection nor to guide or monitor treatment for MRSA infections.   Culture, Urine     Status: None   Collection Time: 04/01/17 11:29 AM  Result Value Ref Range Status   Specimen Description URINE, CATHETERIZED  Final   Special Requests NONE  Final   Culture NO GROWTH  Final   Report Status 04/02/2017 FINAL  Final      Radiology Studies: No results found.   Scheduled Meds: .  allopurinol  100 mg Oral Daily  . amLODipine  10 mg Oral Daily  . apixaban  5 mg Oral BID  . colchicine  0.6 mg Oral Daily  . febuxostat  40 mg Oral Daily  . insulin aspart  0-9 Units Subcutaneous TID WC  . insulin glargine  20 Units Subcutaneous Daily  . mouth rinse  15 mL Mouth Rinse BID  . pantoprazole  40 mg Oral Daily  . pneumococcal 23 valent vaccine  0.5  mL Intramuscular Tomorrow-1000  . polyethylene glycol  17 g Oral BID  . pravastatin  40 mg Oral q1800  . rOPINIRole  4 mg Oral BID  . senna-docusate  2 tablet Oral BID   Continuous Infusions: . cefTRIAXone (ROCEPHIN)  IV Stopped (04/03/17 1300)     LOS: 5 days   Time Spent in minutes   35 minutes  Maryama Kuriakose D.O. on 04/04/2017 at 10:24 AM  Between 7am to 7pm - Pager - [phone]  After 7pm go to [email or web address] - password [email or web address]  And look for the night coverage person covering for me after hours  [facility] Office  [phone]

## 2017-04-04 NOTE — Care Management Note (Signed)
Case Management Note Marvetta Gibbons RN, BSN Unit 2W-Case Manager-- [place] coverage [phone]  Patient Details  Name: KITZIA CAMUS MRN: [id number] Date of Birth: September 24, 1946  Subjective/Objective:   Pt admitted with sepsis, hx of recent fall                 Action/Plan: PTA pt lived at home- per PT eval recommendation for SNF- CSW consulted for placement needs.- CM to follow  Expected Discharge Date:                  Expected Discharge Plan:  [place]  In-House Referral:  Clinical Social Work  Discharge planning Services  CM Consult  Post Acute Care Choice:    Choice offered to:     DME Arranged:    DME Agency:     HH Arranged:    [place] Agency:     Status of Service:  In process, will continue to follow  If discussed at Long Length of Stay Meetings, dates discussed:    Discharge Disposition:   Additional Comments:  Dawayne Patricia, RN 04/04/2017, 11:11 AM

## 2017-04-05 ENCOUNTER — Inpatient Hospital Stay (HOSPITAL_COMMUNITY): Payer: PPO

## 2017-04-05 DIAGNOSIS — R0789 Other chest pain: Secondary | ICD-10-CM | POA: Diagnosis not present

## 2017-04-05 DIAGNOSIS — I10 Essential (primary) hypertension: Secondary | ICD-10-CM | POA: Diagnosis not present

## 2017-04-05 DIAGNOSIS — I5033 Acute on chronic diastolic (congestive) heart failure: Secondary | ICD-10-CM | POA: Diagnosis not present

## 2017-04-05 DIAGNOSIS — W19XXXA Unspecified fall, initial encounter: Secondary | ICD-10-CM | POA: Diagnosis not present

## 2017-04-05 DIAGNOSIS — R0902 Hypoxemia: Secondary | ICD-10-CM

## 2017-04-05 DIAGNOSIS — R0603 Acute respiratory distress: Secondary | ICD-10-CM | POA: Diagnosis not present

## 2017-04-05 DIAGNOSIS — J90 Pleural effusion, not elsewhere classified: Secondary | ICD-10-CM

## 2017-04-05 DIAGNOSIS — N179 Acute kidney failure, unspecified: Secondary | ICD-10-CM | POA: Diagnosis not present

## 2017-04-05 DIAGNOSIS — R0682 Tachypnea, not elsewhere classified: Secondary | ICD-10-CM

## 2017-04-05 DIAGNOSIS — N12 Tubulo-interstitial nephritis, not specified as acute or chronic: Secondary | ICD-10-CM | POA: Diagnosis not present

## 2017-04-05 DIAGNOSIS — A419 Sepsis, unspecified organism: Secondary | ICD-10-CM | POA: Diagnosis not present

## 2017-04-05 LAB — CBC
HCT: 36.3 % (ref 36.0–46.0)
Hemoglobin: 10.9 g/dL — ABNORMAL LOW (ref 12.0–15.0)
MCH: 29 pg (ref 26.0–34.0)
MCHC: 30 g/dL (ref 30.0–36.0)
MCV: 96.5 fL (ref 78.0–100.0)
Platelets: 351 10*3/uL (ref 150–400)
RBC: 3.76 MIL/uL — ABNORMAL LOW (ref 3.87–5.11)
RDW: 15.4 % (ref 11.5–15.5)
WBC: 11.1 10*3/uL — ABNORMAL HIGH (ref 4.0–10.5)

## 2017-04-05 LAB — BASIC METABOLIC PANEL
Anion gap: 9 (ref 5–15)
BUN: 32 mg/dL — ABNORMAL HIGH (ref 6–20)
CO2: 29 mmol/L (ref 22–32)
Calcium: 9.1 mg/dL (ref 8.9–10.3)
Chloride: 101 mmol/L (ref 101–111)
Creatinine, Ser: 1.75 mg/dL — ABNORMAL HIGH (ref 0.44–1.00)
GFR calc Af Amer: 33 mL/min — ABNORMAL LOW (ref >60)
GFR calc non Af Amer: 28 mL/min — ABNORMAL LOW (ref >60)
Glucose, Bld: 250 mg/dL — ABNORMAL HIGH (ref 65–99)
Potassium: 4.6 mmol/L (ref 3.5–5.1)
Sodium: 139 mmol/L (ref 135–145)

## 2017-04-05 LAB — GLUCOSE, CAPILLARY
Glucose-Capillary: 261 mg/dL — ABNORMAL HIGH (ref 65–99)
Glucose-Capillary: 256 mg/dL — ABNORMAL HIGH (ref 65–99)
Glucose-Capillary: 282 mg/dL — ABNORMAL HIGH (ref 65–99)
Glucose-Capillary: 342 mg/dL — ABNORMAL HIGH (ref 65–99)

## 2017-04-05 MED ORDER — LIDOCAINE HCL (PF) 1 % IJ SOLN
INTRAMUSCULAR | Status: AC
  Filled 2017-04-05: qty 30

## 2017-04-05 MED ORDER — FUROSEMIDE 20 MG PO TABS
20.0000 mg | ORAL_TABLET | Freq: Every day | ORAL | Status: DC
  Administered 2017-04-05 – 2017-04-07 (×2): 20 mg via ORAL
  Filled 2017-04-05 (×3): qty 1

## 2017-04-05 NOTE — Progress Notes (Signed)
Inpatient Diabetes Program Recommendations  AACE/ADA: New Consensus Statement on Inpatient Glycemic Control (2015)  Target Ranges:  Prepandial:   less than 140 mg/dL      Peak postprandial:   less than 180 mg/dL (1-2 hours)      Critically ill patients:  140 - 180 mg/dL    Results for TATE, JERKINS (MRN [id number]) as of 04/05/2017 14:01  Ref. Range 04/04/2017 11:44 04/04/2017 16:46 04/04/2017 21:41 04/05/2017 07:42 04/05/2017 12:21  Glucose-Capillary Latest Ref Range: 65 - 99 mg/dL 238 (H) 270 (H) 255 (H) 261 (H) 256 (H)    Pt home insulin dose significantly higher than inpatient .  Please consider increasing Lantus to 20 units bid and correction to  Moderate scale.  [place], CDE. M.Ed. Pager [phone] Inpatient Diabetes Coordinator

## 2017-04-05 NOTE — Procedures (Signed)
Placed patient on BIPAP for the night.  Patient is tolerating well at this time. 

## 2017-04-05 NOTE — Progress Notes (Signed)
PROGRESS NOTE    Jean Mcclain  BOF:[id number] DOB: Feb 28, 1946 DOA: 03/30/2017 PCP: Leanna Battles, MD   Chief Complaint  Patient presents with  . Fall    Brief Narrative:  HPI on 03/30/2017 by Dr. Benjaman Lobe Jean Mcclain is a 71 y.o. female  with past medical history of diabetes, reflux, sleep apnea, osteopenia presented to the emergency room after fall downstairs. Patient states she fell and has pain on the left side. Came to emergency room for evaluation due to concern for possible injury. Has some CP post fall. Denies head injury with fall.  Interim history  Being treated for sepsis secondary to [facility] UTI/bacteremia.  Overnight, patient had desaturation and needed to be placed on BiPAP. CXR obtained and showed worsening pleural effusion. Thoracentesis ordered.  Assessment & Plan   Severe sepsis secondary to Escherichia coli bacteremia/UTI/pyelonephritis -Patient presented with fever, tachycardia, tachypnea, leukocytosis and encephalopathy, acute kidney injury -Blood cultures show Escherichia coli -UA positive for UTI -Urine culture showed no growth however was obtained more than 24 hours after antibiotic initiation -Procalcitonin trending downward, lactic acid within normal limits -Patient was initially placed on vancomycin and Zosyn which have been transitioned to ceftriaxone  Acute hypoxic/hypercarbic respiratory failure/obstructive sleep apnea -Possibly secondary to sepsis and volume overload -Patient did require BiPAP upon admission however over the last 24-48 hours patient has done well on oxygen via nasal cannula as well as nonrebreather. -PCCM consulted and appreciated, was managing BiPAP -Will continue to wean as possible -Overnight, patient had oxygen desaturation. Patient was placed on BiPAP and tolerated it well.  -CXR obtained showing enlarging left pleural effusion.  -Discussed at length with patient the need for home oxygen and the use of CPAP at home.  Patient does not like either, feels her "nose is stuffed".  -Continue flonase and nasal saline, supplemental oxygen. Have ordered CPAP for this evening.  -Continue to encourage incentive spirometry - difficult due to patient's pain from rib fractures  Left sided pleural effusion -Possibly related to sepsis and IVF fluid received during hospitalization vs CHF -CXR from overnight 6/26, showed enlargement of effusion as compared to CXR from 6/22. -IR consulted for thoracentesis  Elevated troponin -Likely demand ischemia in the setting of severe sepsis and respiratory failure -Echocardiogram done on 03/11/2017, listed below -Troponin peaked to 0.18, and trended downward -Currently chest pain-free  DVT -Continue Eliquis  Acute encephalopathy -Resolved, patient appears to be back to baseline -CT head with no acute findings -Suspect secondary to sepsis as well as hypercapnia/hypoxia  Multiple left rib fractures secondary to recent fall -X-ray shows significant rib fractures from ribs 3 through 8 -Encourage incentive spirometry and ambulation -Continue pain control as needed  Essential hypertension -Lisinopril held secondary to acute kidney injury -Continue amlodipine and IV hydralazine as needed  Acute on chronic diastolic heart failure -Diuresis initially held due to worsening renal function -Echocardiogram on 03/11/2017 showed an EF of 77-82%, grade 1 diastolic dysfunction -Currently appears to be euvolemic -Patient did have some lower extremity edema and was given 1 dose of Lasix -No urine output documented over past 24 hours- will order strict I/Os -Will start patient on lasix PO 20mg  daily and monitor creatinine closely   Acute kidney injury on CKD, stage III -Likely secondary to sepsis -Renal ultrasound showed increased renal echogenicity consistent with chronic renal disease, no hydronephrosis or acute renal abnormality -Creatinine peaked at 2.6, baseline approximately  1.8 -Currently creatinine 1.75 -Continue to monitor BMP  Gout -Appears stable, continue allopurinol,  colchicine, uloric  Diabetes mellitus, type II -Patient uses NPH at home -Continue Lantus, insulin sliding scale, CBG monitoring  Recent left ankle fracture -Continue using CAM boot and follow-up with orthopedics as an outpatient -PT consulted and recommended SNF  GERD -Continue PPI  Hyperlipidemia -Continue statin  Restless leg syndrome -Continue Requip  Dermatitis -Improved with sarna  Acute normocytic anemia -Hemoglobin currently stable, continue to monitor CBC  DVT Prophylaxis  Eliquis  Code Status: Full  Family Communication: None at bedside  Disposition Plan: Admitted. Continue to monitor in stepdown. Patient is at high risk for decompensation.  Needs SNF at discharge- possibly in 1-2 days  Consultants PCCM Interventional radiology  Procedures  Abdominal ultrasound  Antibiotics   Anti-infectives    Start     Dose/Rate Route Frequency Ordered Stop   04/02/17 1130  cefTRIAXone (ROCEPHIN) 2 g in dextrose 5 % 50 mL IVPB     2 g 100 mL/hr over 30 Minutes Intravenous Every 24 hours 04/02/17 1023     04/01/17 1300  vancomycin (VANCOCIN) 1,500 mg in sodium chloride 0.9 % 500 mL IVPB  Status:  Discontinued     1,500 mg 250 mL/hr over 120 Minutes Intravenous Every 48 hours 03/30/17 1216 03/31/17 0852   03/30/17 2100  piperacillin-tazobactam (ZOSYN) IVPB 3.375 g  Status:  Discontinued     3.375 g 12.5 mL/hr over 240 Minutes Intravenous Every 8 hours 03/30/17 1216 04/02/17 1023   03/30/17 1230  vancomycin (VANCOCIN) 2,000 mg in sodium chloride 0.9 % 500 mL IVPB     2,000 mg 250 mL/hr over 120 Minutes Intravenous  Once 03/30/17 1202 03/30/17 1535   03/30/17 1200  piperacillin-tazobactam (ZOSYN) IVPB 3.375 g     3.375 g 100 mL/hr over 30 Minutes Intravenous  Once 03/30/17 1155 03/30/17 1427   03/30/17 1200  vancomycin (VANCOCIN) IVPB 1000 mg/200 mL premix   Status:  Discontinued     1,000 mg 200 mL/hr over 60 Minutes Intravenous  Once 03/30/17 1155 03/30/17 1202      Subjective:   Jean Mcclain seen and examined today. Patient states she had aweful night given her breathing. Felt she had nasal congestion. Patient required BiPAP. Currently feels breathing is better. Does not like using nasal saline/flonase as she does not like swallowing it. Patient complains of having to wear CAM boot on her LLE, feels it is heavy. Currently denies chest pain, cough, abdominal pain, nausea, vomiting, diarrhea, constipation.  Objective:   Vitals:   04/04/17 2348 04/05/17 0017 04/05/17 0507 04/05/17 0744  BP: (!) 152/83  (!) 157/76 (!) 156/70  Pulse: 98 83 93 82  Resp: 19 18 (!) 21   Temp: 98.6 F (37 C)  98.6 F (37 C) 97.8 F (36.6 C)  TempSrc: Axillary  Oral Oral  SpO2: 98% 95% 95%   Weight:   108.8 kg (239 lb 12.8 oz)   Height:       No intake or output data in the 24 hours ending 04/05/17 1009 Filed Weights   04/03/17 0300 04/04/17 0400 04/05/17 0507  Weight: 110.4 kg (243 lb 6.2 oz) 109.4 kg (241 lb 2.9 oz) 108.8 kg (239 lb 12.8 oz)   Exam  General: Well developed, well nourished NAD  HEENT: NCAT, mucous membranes moist.   Cardiovascular: S1 S2 auscultated, no rubs, murmurs or gallops. Regular rate and rhythm.  Respiratory: Diminished breath sounds more on the left, however no wheezing or rhonchi. Equal chest rise.   Abdomen: Soft, obese, nontender, nondistended, +  bowel sounds  Extremities: warm dry without cyanosis clubbing or edema. CAM boot on LLE.   Neuro: AAOx3, nonfoal  Psych: Anxious, however, appropriate  Data Reviewed: I have personally reviewed following labs and imaging studies  CBC:  Recent Labs Lab 03/30/17 1128  04/01/17 0207 04/02/17 0218 04/03/17 0218 04/04/17 0553 04/05/17 0515  WBC 14.1*  < > 10.4 10.5 12.1* 10.9* 11.1*  NEUTROABS 12.0*  --   --   --   --   --   --   HGB 11.0*  < > 9.7* 9.7* 10.7*  10.9* 10.9*  HCT 35.5*  < > 32.7* 32.4* 34.8* 34.9* 36.3  MCV 95.2  < > 98.2 96.7 95.1 94.1 96.5  PLT 227  < > 247 253 298 308 351  < > = values in this interval not displayed. Basic Metabolic Panel:  Recent Labs Lab 03/30/17 1128  04/01/17 0207 04/01/17 1520 04/02/17 0218 04/03/17 0218 04/04/17 0553 04/05/17 0515  NA 134*  < > 143 138 141 140 139 139  K 4.8  < > 4.5 4.5 4.4 4.3 3.9 4.6  CL 101  < > 104 102 103 102 102 101  CO2 23  < > 28 27 30 30 29 29   GLUCOSE 282*  < > 132* 169* 126* 160* 233* 250*  BUN 31*  < > 39* 39* 36* 33* 30* 32*  CREATININE 2.31*  < > 2.66* 2.31* 2.18* 1.80* 1.60* 1.75*  CALCIUM 8.1*  < > 8.0* 8.4* 8.5* 8.7* 8.7* 9.1  MG 1.8  --  2.2  --  2.2  --   --   --   PHOS 2.9  --   --   --   --   --   --   --   < > = values in this interval not displayed. GFR: Estimated Creatinine Clearance: 32.3 mL/min (A) (by C-G formula based on SCr of 1.75 mg/dL (H)). Liver Function Tests:  Recent Labs Lab 03/30/17 1128  AST 24  ALT 23  ALKPHOS 97  BILITOT 0.7  PROT 6.2*  ALBUMIN 2.9*   No results for input(s): LIPASE, AMYLASE in the last 168 hours. No results for input(s): AMMONIA in the last 168 hours. Coagulation Profile:  Recent Labs Lab 03/30/17 1128  INR 1.65   Cardiac Enzymes:  Recent Labs Lab 03/31/17 0756 03/31/17 1234 03/31/17 1849  TROPONINI 0.18* 0.05* 0.03*   BNP (last 3 results) No results for input(s): PROBNP in the last 8760 hours. HbA1C: No results for input(s): HGBA1C in the last 72 hours. CBG:  Recent Labs Lab 04/04/17 0734 04/04/17 1144 04/04/17 1646 04/04/17 2141 04/05/17 0742  GLUCAP 198* 238* 270* 255* 261*   Lipid Profile: No results for input(s): CHOL, HDL, LDLCALC, TRIG, CHOLHDL, LDLDIRECT in the last 72 hours. Thyroid Function Tests: No results for input(s): TSH, T4TOTAL, FREET4, T3FREE, THYROIDAB in the last 72 hours. Anemia Panel: No results for input(s): VITAMINB12, FOLATE, FERRITIN, TIBC, IRON,  RETICCTPCT in the last 72 hours. Urine analysis:    Component Value Date/Time   COLORURINE YELLOW 03/30/2017 1718   APPEARANCEUR CLOUDY (A) 03/30/2017 1718   LABSPEC >1.030 (H) 03/30/2017 1718   PHURINE 6.0 03/30/2017 1718   GLUCOSEU NEGATIVE 03/30/2017 1718   HGBUR LARGE (A) 03/30/2017 1718   BILIRUBINUR NEGATIVE 03/30/2017 1718   KETONESUR NEGATIVE 03/30/2017 1718   PROTEINUR >300 (A) 03/30/2017 1718   UROBILINOGEN 0.2 10/01/2009 1740   NITRITE POSITIVE (A) 03/30/2017 1718   LEUKOCYTESUR MODERATE (A) 03/30/2017  1718   Sepsis Labs: @LABRCNTIP (procalcitonin:4,lacticidven:4)  ) Recent Results (from the past 240 hour(s))  Culture, blood (Routine x 2)     Status: Abnormal   Collection Time: 03/30/17 12:28 PM  Result Value Ref Range Status   Specimen Description BLOOD RIGHT FOREARM  Final   Special Requests   Final    BOTTLES DRAWN AEROBIC AND ANAEROBIC Blood Culture adequate volume   Culture  Setup Time   Final    GRAM NEGATIVE RODS ANAEROBIC BOTTLE ONLY CRITICAL RESULT CALLED TO, READ BACK BY AND VERIFIED WITH: PHARMD T STONE [id number] MLM    Culture ESCHERICHIA COLI (A)  Final   Report Status 04/02/2017 FINAL  Final   Organism ID, Bacteria ESCHERICHIA COLI  Final      Susceptibility   Escherichia coli - MIC*    AMPICILLIN >=32 RESISTANT Resistant     CEFAZOLIN <=4 SENSITIVE Sensitive     CEFEPIME <=1 SENSITIVE Sensitive     CEFTAZIDIME <=1 SENSITIVE Sensitive     CEFTRIAXONE <=1 SENSITIVE Sensitive     CIPROFLOXACIN <=0.25 SENSITIVE Sensitive     GENTAMICIN <=1 SENSITIVE Sensitive     IMIPENEM <=0.25 SENSITIVE Sensitive     TRIMETH/SULFA <=20 SENSITIVE Sensitive     AMPICILLIN/SULBACTAM 16 INTERMEDIATE Intermediate     PIP/TAZO <=4 SENSITIVE Sensitive     Extended ESBL NEGATIVE Sensitive     * ESCHERICHIA COLI  Blood Culture ID Panel (Reflexed)     Status: Abnormal   Collection Time: 03/30/17 12:28 PM  Result Value Ref Range Status   Enterococcus species NOT  DETECTED NOT DETECTED Final   Listeria monocytogenes NOT DETECTED NOT DETECTED Final   Staphylococcus species NOT DETECTED NOT DETECTED Final   Staphylococcus aureus NOT DETECTED NOT DETECTED Final   Streptococcus species NOT DETECTED NOT DETECTED Final   Streptococcus agalactiae NOT DETECTED NOT DETECTED Final   Streptococcus pneumoniae NOT DETECTED NOT DETECTED Final   Streptococcus pyogenes NOT DETECTED NOT DETECTED Final   Acinetobacter baumannii NOT DETECTED NOT DETECTED Final   Enterobacteriaceae species DETECTED (A) NOT DETECTED Final    Comment: Enterobacteriaceae represent a large family of gram-negative bacteria, not a single organism. CRITICAL RESULT CALLED TO, READ BACK BY AND VERIFIED WITH: PHARMD T STONE [id number] MLM    Enterobacter cloacae complex NOT DETECTED NOT DETECTED Final   Escherichia coli DETECTED (A) NOT DETECTED Final    Comment: CRITICAL RESULT CALLED TO, READ BACK BY AND VERIFIED WITH: PHARMD T STONE [id number] 0821 MLM    Klebsiella oxytoca NOT DETECTED NOT DETECTED Final   Klebsiella pneumoniae NOT DETECTED NOT DETECTED Final   Proteus species NOT DETECTED NOT DETECTED Final   Serratia marcescens NOT DETECTED NOT DETECTED Final   Carbapenem resistance NOT DETECTED NOT DETECTED Final   Haemophilus influenzae NOT DETECTED NOT DETECTED Final   Neisseria meningitidis NOT DETECTED NOT DETECTED Final   Pseudomonas aeruginosa NOT DETECTED NOT DETECTED Final   Candida albicans NOT DETECTED NOT DETECTED Final   Candida glabrata NOT DETECTED NOT DETECTED Final   Candida krusei NOT DETECTED NOT DETECTED Final   Candida parapsilosis NOT DETECTED NOT DETECTED Final   Candida tropicalis NOT DETECTED NOT DETECTED Final  Culture, blood (Routine x 2)     Status: None   Collection Time: 03/30/17  1:11 PM  Result Value Ref Range Status   Specimen Description BLOOD RIGHT HAND  Final   Special Requests   Final    BOTTLES DRAWN AEROBIC AND ANAEROBIC Blood Culture  adequate  volume   Culture NO GROWTH 5 DAYS  Final   Report Status 04/04/2017 FINAL  Final  MRSA PCR Screening     Status: None   Collection Time: 03/31/17  5:32 AM  Result Value Ref Range Status   MRSA by PCR NEGATIVE NEGATIVE Final    Comment:        The GeneXpert MRSA Assay (FDA approved for NASAL specimens only), is one component of a comprehensive MRSA colonization surveillance program. It is not intended to diagnose MRSA infection nor to guide or monitor treatment for MRSA infections.   Culture, Urine     Status: None   Collection Time: 04/01/17 11:29 AM  Result Value Ref Range Status   Specimen Description URINE, CATHETERIZED  Final   Special Requests NONE  Final   Culture NO GROWTH  Final   Report Status 04/02/2017 FINAL  Final      Radiology Studies: Dg Chest Port 1 View  Result Date: 04/04/2017 CLINICAL DATA:  71 year old female with tachypnea and hypoxia. EXAM: PORTABLE CHEST 1 VIEW COMPARISON:  04/01/2017 FINDINGS: Increased density overlying the left hemithorax likely representing an enlarging left pleural effusion. Left lower lung consolidation/atelectasis has increased. There is no evidence of pneumothorax. Rib fractures again noted. The right lung is clear. IMPRESSION: Increased density overlying the left hemithorax likely representing an enlarging left pleural effusion. Increasing left lower lung consolidation/atelectasis. Electronically Signed   By: Margarette Canada M.D.   On: 04/04/2017 23:49     Scheduled Meds: . allopurinol  100 mg Oral Daily  . amLODipine  10 mg Oral Daily  . apixaban  5 mg Oral BID  . colchicine  0.6 mg Oral Daily  . febuxostat  40 mg Oral Daily  . fluticasone  2 spray Each Nare QHS  . guaiFENesin  600 mg Oral BID  . insulin aspart  0-9 Units Subcutaneous TID WC  . insulin glargine  20 Units Subcutaneous Daily  . mouth rinse  15 mL Mouth Rinse BID  . pantoprazole  40 mg Oral Daily  . pneumococcal 23 valent vaccine  0.5 mL Intramuscular  Tomorrow-1000  . pravastatin  40 mg Oral q1800  . rOPINIRole  4 mg Oral BID  . senna-docusate  2 tablet Oral BID   Continuous Infusions: . cefTRIAXone (ROCEPHIN)  IV Stopped (04/04/17 1235)     LOS: 6 days   Time Spent in minutes   35 minutes  Keelen Quevedo D.O. on 04/05/2017 at 10:09 AM  Between 7am to 7pm - Pager - [phone]  After 7pm go to [email or web address] - password [email or web address]  And look for the night coverage person covering for me after hours  [facility] Office  [phone]

## 2017-04-05 NOTE — Consult Note (Signed)
   [facility] Inpatient Consult   04/05/2017  MODESTA SAMMONS [birth year] [phone]      Patient screened for potential [facility] Care Management services. Chart reviewed. Noted current discharge plan is for SNF.  There are no identifiable [facility] Care Management needs at this time. If patient's post hospital needs change, please place a [facility] Care Management consult. Confirmed with inpatient RNCM. For questions please contact:  Marthenia Rolling, [place], RN,BSN [facility] Liaison [phone]

## 2017-04-05 NOTE — Progress Notes (Signed)
Patient brought to radiology department for thoracentesis. Patient was anxious about procedure and unable to turn or sit with assistance to be able to position for procedure.  Rolling difficult to due to pain and inability to tolerate HOB being lowered. Patient became more anxious  and tearful with attempts to reposition. Discussed options with patient including to continue with maximum assistance to get her better positioned or to coordinate with floor to make sure she has medication prior to procedure tomorrow.  Patient requests we reattempt tomorrow.  Discussed with floor RN.  Will reattempt bringing patient to unit tomorrow and will let floor know so that patient can be given pain medication prior to procedure.  Consent signed and in chart.   Brynda Greathouse, MMS RDN PA-C 4:44 PM

## 2017-04-05 NOTE — Plan of Care (Signed)
Problem: Safety: Goal: Ability to remain free from injury will improve Outcome: Progressing Pt compliant with use of call light the ask for assistance  Problem: Pain Managment: Goal: General experience of comfort will improve Outcome: Progressing Receives PRN oxy ir  Problem: Physical Regulation: Goal: Will remain free from infection Outcome: Progressing Receiving IV abx daily

## 2017-04-05 NOTE — Progress Notes (Signed)
04/05/2017- Pt placed on BIPAP for the night.  Tolerating well.

## 2017-04-06 ENCOUNTER — Inpatient Hospital Stay (HOSPITAL_COMMUNITY): Payer: PPO

## 2017-04-06 ENCOUNTER — Encounter (HOSPITAL_COMMUNITY): Payer: Self-pay | Admitting: Student

## 2017-04-06 DIAGNOSIS — R4182 Altered mental status, unspecified: Secondary | ICD-10-CM

## 2017-04-06 HISTORY — PX: IR THORACENTESIS ASP PLEURAL SPACE W/IMG GUIDE: IMG5380

## 2017-04-06 LAB — BASIC METABOLIC PANEL
Anion gap: 9 (ref 5–15)
BUN: 34 mg/dL — ABNORMAL HIGH (ref 6–20)
CO2: 31 mmol/L (ref 22–32)
Calcium: 9.1 mg/dL (ref 8.9–10.3)
Chloride: 99 mmol/L — ABNORMAL LOW (ref 101–111)
Creatinine, Ser: 1.7 mg/dL — ABNORMAL HIGH (ref 0.44–1.00)
GFR calc Af Amer: 34 mL/min — ABNORMAL LOW (ref >60)
GFR calc non Af Amer: 29 mL/min — ABNORMAL LOW (ref >60)
Glucose, Bld: 285 mg/dL — ABNORMAL HIGH (ref 65–99)
Potassium: 4.1 mmol/L (ref 3.5–5.1)
Sodium: 139 mmol/L (ref 135–145)

## 2017-04-06 LAB — CBC
HCT: 35.1 % — ABNORMAL LOW (ref 36.0–46.0)
Hemoglobin: 10.7 g/dL — ABNORMAL LOW (ref 12.0–15.0)
MCH: 28.7 pg (ref 26.0–34.0)
MCHC: 30.5 g/dL (ref 30.0–36.0)
MCV: 94.1 fL (ref 78.0–100.0)
Platelets: 370 10*3/uL (ref 150–400)
RBC: 3.73 MIL/uL — ABNORMAL LOW (ref 3.87–5.11)
RDW: 15.1 % (ref 11.5–15.5)
WBC: 10.5 10*3/uL (ref 4.0–10.5)

## 2017-04-06 LAB — GLUCOSE, CAPILLARY
Glucose-Capillary: 273 mg/dL — ABNORMAL HIGH (ref 65–99)
Glucose-Capillary: 324 mg/dL — ABNORMAL HIGH (ref 65–99)
Glucose-Capillary: 293 mg/dL — ABNORMAL HIGH (ref 65–99)

## 2017-04-06 LAB — BODY FLUID CELL COUNT WITH DIFFERENTIAL
Eos, Fluid: 0 %
Lymphs, Fluid: 6 %
Monocyte-Macrophage-Serous Fluid: 70 % (ref 50–90)
Neutrophil Count, Fluid: 24 % (ref 0–25)
Other Cells, Fluid: 0 %
Total Nucleated Cell Count, Fluid: 2033 cu mm — ABNORMAL HIGH (ref 0–1000)

## 2017-04-06 LAB — PROTEIN, PLEURAL OR PERITONEAL FLUID: Total protein, fluid: 3.5 g/dL

## 2017-04-06 LAB — ALBUMIN, PLEURAL OR PERITONEAL FLUID: Albumin, Fluid: 1.7 g/dL

## 2017-04-06 LAB — LACTATE DEHYDROGENASE, PLEURAL OR PERITONEAL FLUID: LD, Fluid: 264 U/L — ABNORMAL HIGH (ref 3–23)

## 2017-04-06 LAB — GLUCOSE, PLEURAL OR PERITONEAL FLUID: Glucose, Fluid: 311 mg/dL

## 2017-04-06 MED ORDER — LORAZEPAM 2 MG/ML IJ SOLN
1.0000 mg | Freq: Once | INTRAMUSCULAR | Status: AC
  Administered 2017-04-06: 1 mg via INTRAVENOUS
  Filled 2017-04-06: qty 1

## 2017-04-06 MED ORDER — LIDOCAINE HCL (PF) 1 % IJ SOLN
INTRAMUSCULAR | Status: AC
  Filled 2017-04-06: qty 30

## 2017-04-06 NOTE — Progress Notes (Signed)
Physical Therapy Treatment Patient Details Name: Jean Mcclain MRN: [id number] DOB: 11-16-1945 Today's Date: 04/06/2017    History of Present Illness  Patient is a 71 yo female admitted 03/30/17 after fall downstairs, she sustained multiple rib fractures, as well workup significant for sepsis secondary to pyelonephritis/bacteremia past medical history of diabetes, reflux, sleep apnea, osteopenia. Plan for thoracentesis 6/28.    PT Comments    Patient progressing well towards PT goals. Tolerated gait training with min A for balance/safety. Requires encouragement for mobility and ambulation. Sp02 dropped to 80s on 6L.min 02 but resolves with cues for pursed lip breathing. Pt can do more than she lets on. Increased time to perform all mobility. Encouraged pt to use LUE more frequently for tasks as pt reluctant to use it due to rib pain. Will follow acutely.   Follow Up Recommendations  SNF;Supervision/Assistance - 24 hour     Equipment Recommendations  Wheelchair (measurements PT);Wheelchair cushion (measurements PT)    Recommendations for Other Services       Precautions / Restrictions Precautions Precautions: Fall Precaution Comments: watch 02 Required Braces or Orthoses: Other Brace/Splint Other Brace/Splint: CAM boot LLE Restrictions Weight Bearing Restrictions: Yes LLE Weight Bearing: Weight bearing as tolerated    Mobility  Bed Mobility Overal bed mobility: Needs Assistance Bed Mobility: Sit to Sidelying;Rolling Rolling: Min assist       Sit to sidelying: Mod assist General bed mobility comments: Assist to bring LEs into bed and lower down onto elbow. Difficult laying flat.   Transfers Overall transfer level: Needs assistance Equipment used: Rolling walker (2 wheeled) Transfers: Sit to/from [facility] Sit to Stand: +2 physical assistance;Mod assist Stand pivot transfers: Min assist;+2 safety/equipment       General transfer comment: Assist to  power to standing with cues for use of body momentum and hand placement. Stood from chair x3, from [facility] x1. SPT chair to bSC and BSC to bed.   Ambulation/Gait Ambulation/Gait assistance: Min assist;+2 safety/equipment Ambulation Distance (Feet): 15 Feet Assistive device: Rolling walker (2 wheeled) Gait Pattern/deviations: Step-through pattern;Step-to pattern;Decreased stance time - left;Decreased step length - right;Trunk flexed Gait velocity: decreased   General Gait Details: Slow, unsteady gait with assist propelling RW. 3/4 DOE. Sp02 dropped to 80% on 6L.min 02. Recovers with cues for pursed lip breathing. Fatigues.    Stairs            Wheelchair Mobility    Modified Rankin (Stroke Patients Only)       Balance Overall balance assessment: Needs assistance Sitting-balance support: No upper extremity supported;Feet supported Sitting balance-Leahy Scale: Fair     Standing balance support: During functional activity Standing balance-Leahy Scale: Poor Standing balance comment: Reliant on UEs for support in standing. TotAl A for pericare.                             Cognition Arousal/Alertness: Awake/alert Behavior During Therapy: WFL for tasks assessed/performed Overall Cognitive Status: Within Functional Limits for tasks assessed                                        Exercises      General Comments        Pertinent Vitals/Pain Pain Assessment: Faces Faces Pain Scale: Hurts little more Pain Location: Lt ribcage Pain Descriptors / Indicators: Grimacing;Guarding;Moaning;Sore Pain Intervention(s): Monitored during session;Repositioned;Premedicated before  session    Home Living                      Prior Function            PT Goals (current goals can now be found in the care plan section) Progress towards PT goals: Progressing toward goals    Frequency    Min 3X/week      PT Plan Current plan remains  appropriate    Co-evaluation              AM-PAC PT "6 Clicks" Daily Activity  Outcome Measure  Difficulty turning over in bed (including adjusting bedclothes, sheets and blankets)?: None Difficulty moving from lying on back to sitting on the side of the bed? : Total Difficulty sitting down on and standing up from a chair with arms (e.g., wheelchair, bedside commode, etc,.)?: Total Help needed moving to and from a bed to chair (including a wheelchair)?: A Little Help needed walking in hospital room?: A Little Help needed climbing 3-5 steps with a railing? : Total 6 Click Score: 13    End of Session Equipment Utilized During Treatment: Oxygen;Gait belt Activity Tolerance: Patient tolerated treatment well Patient left: in bed;with call bell/phone within reach;with nursing/sitter in room (going down for test) Nurse Communication: Mobility status PT Visit Diagnosis: Difficulty in walking, not elsewhere classified (R26.2);Muscle weakness (generalized) (M62.81);Pain Pain - Right/Left: Left Pain - part of body:  (rib cage)     Time: 9407-6808 PT Time Calculation (min) (ACUTE ONLY): 41 min  Charges:  $Gait Training: 23-37 mins $Therapeutic Activity: 8-22 mins                    G Codes:       Wray Kearns, PT, DPT [phone]     Marguarite Arbour A Tala Eber 04/06/2017, 12:04 PM

## 2017-04-06 NOTE — Progress Notes (Signed)
Results for ANASIA, AGRO (MRN [id number]) as of 04/06/2017 11:10  Ref. Range 04/05/2017 07:42 04/05/2017 12:21 04/05/2017 17:31 04/05/2017 21:29 04/06/2017 08:09  Glucose-Capillary Latest Ref Range: 65 - 99 mg/dL 261 (H) 256 (H) 282 (H) 342 (H) 273 (H)  Noted that blood sugars continue to be greater than 180 mg/dl.  Recommend increasing Lantus to 30 units daily and increasing Novolog to MODERATE correction scale TID & HS if eating.  Will continue to monitor blood sugars while in the hospital.   Harvel Ricks RN BSN CDE Diabetes Coordinator Pager: [phone]  8am-5pm

## 2017-04-06 NOTE — Progress Notes (Addendum)
Patient is agreeable to SNF. Bed offers were given 6/27 and patient and family prefer [facility]. [facility] accepted patient's referral. CSW started and received authorization from [facility] for SNF. Auth #[id number]. Per MD, discharge possible for 6/29. CSW continuing to follow and will support with discharge to SNF.  Estanislado Emms, [place]

## 2017-04-06 NOTE — Progress Notes (Signed)
PROGRESS NOTE    Jean Mcclain  NAT:[id number] DOB: [birth year] DOA: 03/30/2017 PCP: Leanna Battles, MD   Chief Complaint  Patient presents with  . Fall    Brief Narrative:  HPI on 03/30/2017 by Dr. Benjaman Lobe Jean Mcclain is a 71 y.o. female  with past medical history of diabetes, reflux, sleep apnea, osteopenia presented to the emergency room after fall downstairs. Patient states she fell and has pain on the left side. Came to emergency room for evaluation due to concern for possible injury. Has some CP post fall. Denies head injury with fall.  Interim history  Being treated for sepsis secondary to [facility] UTI/bacteremia.  Overnight, patient had desaturation and needed to be placed on BiPAP. CXR obtained and showed worsening pleural effusion. Thoracentesis ordered.  Assessment & Plan   Severe sepsis secondary to Escherichia coli bacteremia/UTI/pyelonephritis -Patient presented with fever, tachycardia, tachypnea, leukocytosis and encephalopathy, acute kidney injury -Blood cultures show Escherichia coli -UA positive for UTI -Urine culture showed no growth however was obtained more than 24 hours after antibiotic initiation -Procalcitonin trending downward, lactic acid within normal limits -Patient was initially placed on vancomycin and Zosyn which have been transitioned to ceftriaxone  Acute hypoxic/hypercarbic respiratory failure/obstructive sleep apnea -Possibly secondary to sepsis and volume overload -Patient did require BiPAP upon admission however over the last 24-48 hours patient has done well on oxygen via nasal cannula as well as nonrebreather. -PCCM consulted and appreciated, was managing BiPAP -Will continue to wean as possible -Overnight on 6/26-27, patient had oxygen desaturation. Patient was placed on BiPAP and tolerated it well.  -CXR obtained showing enlarging left pleural effusion.  -Discussed at length with patient the need for home oxygen and the use of CPAP  at home. Patient does not like either, feels her "nose is stuffed".  -Continue flonase and nasal saline, supplemental oxygen. Have ordered CPAP for this evening.  -Continue to encourage incentive spirometry - difficult due to patient's pain from rib fractures  Left sided pleural effusion -Possibly related to sepsis and IVF fluid received during hospitalization vs CHF -CXR from overnight 6/26, showed enlargement of effusion as compared to CXR from 6/22. -IR consulted for thoracentesis, attempted on 6/27, however patient unable to tolerate. Will reattempt today -Started patient on low dose lasix 20mg  daily  -Ordered ativan for procedure  Elevated troponin -Likely demand ischemia in the setting of severe sepsis and respiratory failure -Echocardiogram done on 03/11/2017, listed below -Troponin peaked to 0.18, and trended downward -Currently chest pain-free  DVT -Continue Eliquis  Acute encephalopathy -Resolved, patient appears to be back to baseline -CT head with no acute findings -Suspect secondary to sepsis as well as hypercapnia/hypoxia  Multiple left rib fractures secondary to recent fall -X-ray shows significant rib fractures from ribs 3 through 8 -Encourage incentive spirometry and ambulation -Continue pain control as needed  Essential hypertension -Lisinopril held secondary to acute kidney injury -Continue amlodipine and IV hydralazine as needed  Acute on chronic diastolic heart failure -Diuresis initially held due to worsening renal function -Echocardiogram on 03/11/2017 showed an EF of 42-70%, grade 1 diastolic dysfunction -Currently appears to be euvolemic -Patient did have some lower extremity edema and was given 1 dose of Lasix -Continue PO lasix 20mg  daily  -Monitor I/O, daily weights -Urine output over past 24hrs 1200cc  Acute kidney injury on CKD, stage III -Likely secondary to sepsis -Renal ultrasound showed increased renal echogenicity consistent with chronic  renal disease, no hydronephrosis or acute renal abnormality -Creatinine peaked at  2.6, baseline approximately 1.8 -Currently creatinine 1.70 -Continue to monitor BMP  Gout -Appears stable, continue allopurinol, colchicine, uloric  Diabetes mellitus, type II -Patient uses NPH at home -Continue Lantus, insulin sliding scale, CBG monitoring  Recent left ankle fracture -Continue using CAM boot and follow-up with orthopedics as an outpatient -PT consulted and recommended SNF  GERD -Continue PPI  Hyperlipidemia -Continue statin  Restless leg syndrome -Continue Requip  Dermatitis -Improved with sarna  Acute normocytic anemia -Hemoglobin currently stable, continue to monitor CBC  DVT Prophylaxis  Eliquis  Code Status: Full  Family Communication: None at bedside  Disposition Plan: Admitted. Continue to monitor in stepdown given patient's need for BIPAP and her she is at high risk for decompensation. Needs SNF at discharge. Pending respiratory course, may be ready in 1-2 days.  Consultants PCCM Interventional radiology  Procedures  Abdominal ultrasound  Antibiotics   Anti-infectives    Start     Dose/Rate Route Frequency Ordered Stop   04/02/17 1130  cefTRIAXone (ROCEPHIN) 2 g in dextrose 5 % 50 mL IVPB     2 g 100 mL/hr over 30 Minutes Intravenous Every 24 hours 04/02/17 1023     04/01/17 1300  vancomycin (VANCOCIN) 1,500 mg in sodium chloride 0.9 % 500 mL IVPB  Status:  Discontinued     1,500 mg 250 mL/hr over 120 Minutes Intravenous Every 48 hours 03/30/17 1216 03/31/17 0852   03/30/17 2100  piperacillin-tazobactam (ZOSYN) IVPB 3.375 g  Status:  Discontinued     3.375 g 12.5 mL/hr over 240 Minutes Intravenous Every 8 hours 03/30/17 1216 04/02/17 1023   03/30/17 1230  vancomycin (VANCOCIN) 2,000 mg in sodium chloride 0.9 % 500 mL IVPB     2,000 mg 250 mL/hr over 120 Minutes Intravenous  Once 03/30/17 1202 03/30/17 1535   03/30/17 1200  piperacillin-tazobactam  (ZOSYN) IVPB 3.375 g     3.375 g 100 mL/hr over 30 Minutes Intravenous  Once 03/30/17 1155 03/30/17 1427   03/30/17 1200  vancomycin (VANCOCIN) IVPB 1000 mg/200 mL premix  Status:  Discontinued     1,000 mg 200 mL/hr over 60 Minutes Intravenous  Once 03/30/17 1155 03/30/17 1202      Subjective:   Jean Mcclain seen and examined today. Patient feels last night was better.  She was unable to tolerate thoracentesis yesterday given her positioning, pain, and anxiety. Feels she was able to urinate quite a bit yesterday. Denies chest pain, but feel it hurts to breath in deeply. Denies abdominal pain, nausea, vomiting, diarrhea, constipation, headache, dizziness. Objective:   Vitals:   04/05/17 2130 04/05/17 2324 04/06/17 0706 04/06/17 0850  BP: (!) 148/79 (!) 150/70 98/71 129/70  Pulse: 95 90 87 85  Resp: 16 18 18 20   Temp: 97.9 F (36.6 C) 97.9 F (36.6 C) 98.4 F (36.9 C) 98.4 F (36.9 C)  TempSrc: Oral Oral Oral Oral  SpO2: 99% 98% 100% 100%  Weight:   108.4 kg (238 lb 14.4 oz)   Height:        Intake/Output Summary (Last 24 hours) at 04/06/17 1003 Last data filed at 04/06/17 0707  Gross per 24 hour  Intake              220 ml  Output             1750 ml  Net            -1530 ml   Filed Weights   04/04/17 0400 04/05/17 0507 04/06/17 7782  Weight: 109.4 kg (241 lb 2.9 oz) 108.8 kg (239 lb 12.8 oz) 108.4 kg (238 lb 14.4 oz)   Exam  General: Well developed, well nourished, NAD, appears stated age  80: NCAT,mucous membranes moist.   Cardiovascular: S1 S2 auscultated, RRR, no murmurs appreciated   Respiratory:Diminished breath sounds on the left, however, no wheezing.   Abdomen: Soft, obese, nontender, nondistended, + bowel sounds  Extremities: warm dry without cyanosis clubbing or edema. LLE with CAM boot  Neuro: AAOx3, nonfocal  Psych: Anxious, but pleasant  Data Reviewed: I have personally reviewed following labs and imaging studies  CBC:  Recent  Labs Lab 03/30/17 1128  04/02/17 0218 04/03/17 0218 04/04/17 0553 04/05/17 0515 04/06/17 0442  WBC 14.1*  < > 10.5 12.1* 10.9* 11.1* 10.5  NEUTROABS 12.0*  --   --   --   --   --   --   HGB 11.0*  < > 9.7* 10.7* 10.9* 10.9* 10.7*  HCT 35.5*  < > 32.4* 34.8* 34.9* 36.3 35.1*  MCV 95.2  < > 96.7 95.1 94.1 96.5 94.1  PLT 227  < > 253 298 308 351 370  < > = values in this interval not displayed. Basic Metabolic Panel:  Recent Labs Lab 03/30/17 1128  04/01/17 0207  04/02/17 5573 04/03/17 2202 04/04/17 0553 04/05/17 0515 04/06/17 0442  NA 134*  < > 143  < > 141 140 139 139 139  K 4.8  < > 4.5  < > 4.4 4.3 3.9 4.6 4.1  CL 101  < > 104  < > 103 102 102 101 99*  CO2 23  < > 28  < > 30 30 29 29 31   GLUCOSE 282*  < > 132*  < > 126* 160* 233* 250* 285*  BUN 31*  < > 39*  < > 36* 33* 30* 32* 34*  CREATININE 2.31*  < > 2.66*  < > 2.18* 1.80* 1.60* 1.75* 1.70*  CALCIUM 8.1*  < > 8.0*  < > 8.5* 8.7* 8.7* 9.1 9.1  MG 1.8  --  2.2  --  2.2  --   --   --   --   PHOS 2.9  --   --   --   --   --   --   --   --   < > = values in this interval not displayed. GFR: Estimated Creatinine Clearance: 33.2 mL/min (A) (by C-G formula based on SCr of 1.7 mg/dL (H)). Liver Function Tests:  Recent Labs Lab 03/30/17 1128  AST 24  ALT 23  ALKPHOS 97  BILITOT 0.7  PROT 6.2*  ALBUMIN 2.9*   No results for input(s): LIPASE, AMYLASE in the last 168 hours. No results for input(s): AMMONIA in the last 168 hours. Coagulation Profile:  Recent Labs Lab 03/30/17 1128  INR 1.65   Cardiac Enzymes:  Recent Labs Lab 03/31/17 0756 03/31/17 1234 03/31/17 1849  TROPONINI 0.18* 0.05* 0.03*   BNP (last 3 results) No results for input(s): PROBNP in the last 8760 hours. HbA1C: No results for input(s): HGBA1C in the last 72 hours. CBG:  Recent Labs Lab 04/05/17 0742 04/05/17 1221 04/05/17 1731 04/05/17 2129 04/06/17 0809  GLUCAP 261* 256* 282* 342* 273*   Lipid Profile: No results for  input(s): CHOL, HDL, LDLCALC, TRIG, CHOLHDL, LDLDIRECT in the last 72 hours. Thyroid Function Tests: No results for input(s): TSH, T4TOTAL, FREET4, T3FREE, THYROIDAB in the last 72 hours. Anemia Panel: No results for input(s):  VITAMINB12, FOLATE, FERRITIN, TIBC, IRON, RETICCTPCT in the last 72 hours. Urine analysis:    Component Value Date/Time   COLORURINE YELLOW 03/30/2017 1718   APPEARANCEUR CLOUDY (A) 03/30/2017 1718   LABSPEC >1.030 (H) 03/30/2017 1718   PHURINE 6.0 03/30/2017 1718   GLUCOSEU NEGATIVE 03/30/2017 1718   HGBUR LARGE (A) 03/30/2017 1718   BILIRUBINUR NEGATIVE 03/30/2017 1718   KETONESUR NEGATIVE 03/30/2017 1718   PROTEINUR >300 (A) 03/30/2017 1718   UROBILINOGEN 0.2 10/01/2009 1740   NITRITE POSITIVE (A) 03/30/2017 1718   LEUKOCYTESUR MODERATE (A) 03/30/2017 1718   Sepsis Labs: @LABRCNTIP (procalcitonin:4,lacticidven:4)  ) Recent Results (from the past 240 hour(s))  Culture, blood (Routine x 2)     Status: Abnormal   Collection Time: 03/30/17 12:28 PM  Result Value Ref Range Status   Specimen Description BLOOD RIGHT FOREARM  Final   Special Requests   Final    BOTTLES DRAWN AEROBIC AND ANAEROBIC Blood Culture adequate volume   Culture  Setup Time   Final    GRAM NEGATIVE RODS ANAEROBIC BOTTLE ONLY CRITICAL RESULT CALLED TO, READ BACK BY AND VERIFIED WITH: PHARMD T STONE [id number] MLM    Culture ESCHERICHIA COLI (A)  Final   Report Status 04/02/2017 FINAL  Final   Organism ID, Bacteria ESCHERICHIA COLI  Final      Susceptibility   Escherichia coli - MIC*    AMPICILLIN >=32 RESISTANT Resistant     CEFAZOLIN <=4 SENSITIVE Sensitive     CEFEPIME <=1 SENSITIVE Sensitive     CEFTAZIDIME <=1 SENSITIVE Sensitive     CEFTRIAXONE <=1 SENSITIVE Sensitive     CIPROFLOXACIN <=0.25 SENSITIVE Sensitive     GENTAMICIN <=1 SENSITIVE Sensitive     IMIPENEM <=0.25 SENSITIVE Sensitive     TRIMETH/SULFA <=20 SENSITIVE Sensitive     AMPICILLIN/SULBACTAM 16  INTERMEDIATE Intermediate     PIP/TAZO <=4 SENSITIVE Sensitive     Extended ESBL NEGATIVE Sensitive     * ESCHERICHIA COLI  Blood Culture ID Panel (Reflexed)     Status: Abnormal   Collection Time: 03/30/17 12:28 PM  Result Value Ref Range Status   Enterococcus species NOT DETECTED NOT DETECTED Final   Listeria monocytogenes NOT DETECTED NOT DETECTED Final   Staphylococcus species NOT DETECTED NOT DETECTED Final   Staphylococcus aureus NOT DETECTED NOT DETECTED Final   Streptococcus species NOT DETECTED NOT DETECTED Final   Streptococcus agalactiae NOT DETECTED NOT DETECTED Final   Streptococcus pneumoniae NOT DETECTED NOT DETECTED Final   Streptococcus pyogenes NOT DETECTED NOT DETECTED Final   Acinetobacter baumannii NOT DETECTED NOT DETECTED Final   Enterobacteriaceae species DETECTED (A) NOT DETECTED Final    Comment: Enterobacteriaceae represent a large family of gram-negative bacteria, not a single organism. CRITICAL RESULT CALLED TO, READ BACK BY AND VERIFIED WITH: PHARMD T STONE [id number] MLM    Enterobacter cloacae complex NOT DETECTED NOT DETECTED Final   Escherichia coli DETECTED (A) NOT DETECTED Final    Comment: CRITICAL RESULT CALLED TO, READ BACK BY AND VERIFIED WITH: PHARMD T STONE [id number] MLM    Klebsiella oxytoca NOT DETECTED NOT DETECTED Final   Klebsiella pneumoniae NOT DETECTED NOT DETECTED Final   Proteus species NOT DETECTED NOT DETECTED Final   Serratia marcescens NOT DETECTED NOT DETECTED Final   Carbapenem resistance NOT DETECTED NOT DETECTED Final   Haemophilus influenzae NOT DETECTED NOT DETECTED Final   Neisseria meningitidis NOT DETECTED NOT DETECTED Final   Pseudomonas aeruginosa NOT DETECTED NOT DETECTED Final  Candida albicans NOT DETECTED NOT DETECTED Final   Candida glabrata NOT DETECTED NOT DETECTED Final   Candida krusei NOT DETECTED NOT DETECTED Final   Candida parapsilosis NOT DETECTED NOT DETECTED Final   Candida tropicalis NOT  DETECTED NOT DETECTED Final  Culture, blood (Routine x 2)     Status: None   Collection Time: 03/30/17  1:11 PM  Result Value Ref Range Status   Specimen Description BLOOD RIGHT HAND  Final   Special Requests   Final    BOTTLES DRAWN AEROBIC AND ANAEROBIC Blood Culture adequate volume   Culture NO GROWTH 5 DAYS  Final   Report Status 04/04/2017 FINAL  Final  MRSA PCR Screening     Status: None   Collection Time: 03/31/17  5:32 AM  Result Value Ref Range Status   MRSA by PCR NEGATIVE NEGATIVE Final    Comment:        The GeneXpert MRSA Assay (FDA approved for NASAL specimens only), is one component of a comprehensive MRSA colonization surveillance program. It is not intended to diagnose MRSA infection nor to guide or monitor treatment for MRSA infections.   Culture, Urine     Status: None   Collection Time: 04/01/17 11:29 AM  Result Value Ref Range Status   Specimen Description URINE, CATHETERIZED  Final   Special Requests NONE  Final   Culture NO GROWTH  Final   Report Status 04/02/2017 FINAL  Final  Culture, blood (routine x 2)     Status: None (Preliminary result)   Collection Time: 04/04/17 11:08 AM  Result Value Ref Range Status   Specimen Description BLOOD RIGHT ANTECUBITAL  Final   Special Requests IN PEDIATRIC BOTTLE Blood Culture adequate volume  Final   Culture NO GROWTH 1 DAY  Final   Report Status PENDING  Incomplete  Culture, blood (routine x 2)     Status: None (Preliminary result)   Collection Time: 04/04/17 11:10 AM  Result Value Ref Range Status   Specimen Description BLOOD RIGHT HAND  Final   Special Requests IN PEDIATRIC BOTTLE Blood Culture adequate volume  Final   Culture NO GROWTH 1 DAY  Final   Report Status PENDING  Incomplete      Radiology Studies: Dg Chest Port 1 View  Result Date: 04/04/2017 CLINICAL DATA:  71 year old female with tachypnea and hypoxia. EXAM: PORTABLE CHEST 1 VIEW COMPARISON:  04/01/2017 FINDINGS: Increased density  overlying the left hemithorax likely representing an enlarging left pleural effusion. Left lower lung consolidation/atelectasis has increased. There is no evidence of pneumothorax. Rib fractures again noted. The right lung is clear. IMPRESSION: Increased density overlying the left hemithorax likely representing an enlarging left pleural effusion. Increasing left lower lung consolidation/atelectasis. Electronically Signed   By: Margarette Canada M.D.   On: 04/04/2017 23:49     Scheduled Meds: . allopurinol  100 mg Oral Daily  . amLODipine  10 mg Oral Daily  . apixaban  5 mg Oral BID  . colchicine  0.6 mg Oral Daily  . febuxostat  40 mg Oral Daily  . fluticasone  2 spray Each Nare QHS  . furosemide  20 mg Oral Daily  . guaiFENesin  600 mg Oral BID  . insulin aspart  0-9 Units Subcutaneous TID WC  . insulin glargine  20 Units Subcutaneous Daily  . LORazepam  1 mg Intravenous Once  . mouth rinse  15 mL Mouth Rinse BID  . pantoprazole  40 mg Oral Daily  . pravastatin  40  mg Oral q1800  . rOPINIRole  4 mg Oral BID  . senna-docusate  2 tablet Oral BID   Continuous Infusions: . cefTRIAXone (ROCEPHIN)  IV Stopped (04/05/17 1326)     LOS: 7 days   Time Spent in minutes   35 minutes  Charlesa Ehle D.O. on 04/06/2017 at 10:03 AM  Between 7am to 7pm - Pager - [phone]  After 7pm go to [email or web address] - password [email or web address]  And look for the night coverage person covering for me after hours  [facility] Office  [phone]

## 2017-04-06 NOTE — Care Management Important Message (Signed)
Important Message  Patient Details  Name: Jean Mcclain MRN: [id number] Date of Birth: 17-Jun-1946   Medicare Important Message Given:  Yes    Nathen May 04/06/2017, 10:13 AM

## 2017-04-06 NOTE — Procedures (Signed)
PROCEDURE SUMMARY:  Successful US guided diagnostic and therapeutic thoracentesis. Yielded 650 mL of bloody fluid. Pt tolerated procedure well. No immediate complications.  Specimen was sent for labs. CXR ordered.  Docia Barrier PA-C 04/06/2017 2:52 PM

## 2017-04-07 DIAGNOSIS — I509 Heart failure, unspecified: Secondary | ICD-10-CM | POA: Diagnosis not present

## 2017-04-07 DIAGNOSIS — M109 Gout, unspecified: Secondary | ICD-10-CM | POA: Diagnosis not present

## 2017-04-07 DIAGNOSIS — R109 Unspecified abdominal pain: Secondary | ICD-10-CM | POA: Diagnosis not present

## 2017-04-07 DIAGNOSIS — R52 Pain, unspecified: Secondary | ICD-10-CM | POA: Diagnosis not present

## 2017-04-07 DIAGNOSIS — Z9889 Other specified postprocedural states: Secondary | ICD-10-CM | POA: Diagnosis not present

## 2017-04-07 DIAGNOSIS — K219 Gastro-esophageal reflux disease without esophagitis: Secondary | ICD-10-CM | POA: Diagnosis not present

## 2017-04-07 DIAGNOSIS — E119 Type 2 diabetes mellitus without complications: Secondary | ICD-10-CM | POA: Diagnosis not present

## 2017-04-07 DIAGNOSIS — R092 Respiratory arrest: Secondary | ICD-10-CM | POA: Diagnosis not present

## 2017-04-07 DIAGNOSIS — I82409 Acute embolism and thrombosis of unspecified deep veins of unspecified lower extremity: Secondary | ICD-10-CM | POA: Diagnosis not present

## 2017-04-07 DIAGNOSIS — Z23 Encounter for immunization: Secondary | ICD-10-CM | POA: Diagnosis present

## 2017-04-07 DIAGNOSIS — A419 Sepsis, unspecified organism: Secondary | ICD-10-CM | POA: Diagnosis not present

## 2017-04-07 DIAGNOSIS — R6520 Severe sepsis without septic shock: Secondary | ICD-10-CM | POA: Diagnosis not present

## 2017-04-07 DIAGNOSIS — W19XXXA Unspecified fall, initial encounter: Secondary | ICD-10-CM | POA: Diagnosis not present

## 2017-04-07 DIAGNOSIS — S299XXA Unspecified injury of thorax, initial encounter: Secondary | ICD-10-CM | POA: Diagnosis not present

## 2017-04-07 DIAGNOSIS — I5033 Acute on chronic diastolic (congestive) heart failure: Secondary | ICD-10-CM | POA: Diagnosis not present

## 2017-04-07 DIAGNOSIS — R4182 Altered mental status, unspecified: Secondary | ICD-10-CM | POA: Diagnosis not present

## 2017-04-07 DIAGNOSIS — I82402 Acute embolism and thrombosis of unspecified deep veins of left lower extremity: Secondary | ICD-10-CM | POA: Diagnosis not present

## 2017-04-07 DIAGNOSIS — N179 Acute kidney failure, unspecified: Secondary | ICD-10-CM | POA: Diagnosis not present

## 2017-04-07 DIAGNOSIS — R0603 Acute respiratory distress: Secondary | ICD-10-CM | POA: Diagnosis not present

## 2017-04-07 DIAGNOSIS — N183 Chronic kidney disease, stage 3 (moderate): Secondary | ICD-10-CM

## 2017-04-07 DIAGNOSIS — K5900 Constipation, unspecified: Secondary | ICD-10-CM | POA: Diagnosis not present

## 2017-04-07 DIAGNOSIS — R0902 Hypoxemia: Secondary | ICD-10-CM | POA: Diagnosis not present

## 2017-04-07 DIAGNOSIS — N390 Urinary tract infection, site not specified: Secondary | ICD-10-CM | POA: Diagnosis not present

## 2017-04-07 DIAGNOSIS — W109XXA Fall (on) (from) unspecified stairs and steps, initial encounter: Secondary | ICD-10-CM | POA: Diagnosis not present

## 2017-04-07 DIAGNOSIS — Z9181 History of falling: Secondary | ICD-10-CM | POA: Diagnosis not present

## 2017-04-07 DIAGNOSIS — I10 Essential (primary) hypertension: Secondary | ICD-10-CM | POA: Diagnosis not present

## 2017-04-07 DIAGNOSIS — M62838 Other muscle spasm: Secondary | ICD-10-CM | POA: Diagnosis not present

## 2017-04-07 DIAGNOSIS — M6281 Muscle weakness (generalized): Secondary | ICD-10-CM | POA: Diagnosis not present

## 2017-04-07 DIAGNOSIS — R748 Abnormal levels of other serum enzymes: Secondary | ICD-10-CM

## 2017-04-07 DIAGNOSIS — J90 Pleural effusion, not elsewhere classified: Secondary | ICD-10-CM | POA: Diagnosis not present

## 2017-04-07 DIAGNOSIS — S2242XD Multiple fractures of ribs, left side, subsequent encounter for fracture with routine healing: Secondary | ICD-10-CM | POA: Diagnosis not present

## 2017-04-07 LAB — BASIC METABOLIC PANEL
Anion gap: 8 (ref 5–15)
BUN: 33 mg/dL — ABNORMAL HIGH (ref 6–20)
CO2: 31 mmol/L (ref 22–32)
Calcium: 8.7 mg/dL — ABNORMAL LOW (ref 8.9–10.3)
Chloride: 98 mmol/L — ABNORMAL LOW (ref 101–111)
Creatinine, Ser: 1.66 mg/dL — ABNORMAL HIGH (ref 0.44–1.00)
GFR calc Af Amer: 35 mL/min — ABNORMAL LOW (ref >60)
GFR calc non Af Amer: 30 mL/min — ABNORMAL LOW (ref >60)
Glucose, Bld: 323 mg/dL — ABNORMAL HIGH (ref 65–99)
Potassium: 3.9 mmol/L (ref 3.5–5.1)
Sodium: 137 mmol/L (ref 135–145)

## 2017-04-07 LAB — GLUCOSE, CAPILLARY
Glucose-Capillary: 301 mg/dL — ABNORMAL HIGH (ref 65–99)
Glucose-Capillary: 319 mg/dL — ABNORMAL HIGH (ref 65–99)
Glucose-Capillary: 301 mg/dL — ABNORMAL HIGH (ref 65–99)

## 2017-04-07 LAB — LACTATE DEHYDROGENASE: LDH: 215 U/L — ABNORMAL HIGH (ref 98–192)

## 2017-04-07 LAB — CBC
HCT: 35.8 % — ABNORMAL LOW (ref 36.0–46.0)
Hemoglobin: 10.7 g/dL — ABNORMAL LOW (ref 12.0–15.0)
MCH: 28.3 pg (ref 26.0–34.0)
MCHC: 29.9 g/dL — ABNORMAL LOW (ref 30.0–36.0)
MCV: 94.7 fL (ref 78.0–100.0)
Platelets: 367 10*3/uL (ref 150–400)
RBC: 3.78 MIL/uL — ABNORMAL LOW (ref 3.87–5.11)
RDW: 15.1 % (ref 11.5–15.5)
WBC: 10.3 10*3/uL (ref 4.0–10.5)

## 2017-04-07 MED ORDER — BISACODYL 5 MG PO TBEC: 5.0000 mg | DELAYED_RELEASE_TABLET | Freq: Every day | ORAL | 0 refills | Status: DC | PRN

## 2017-04-07 MED ORDER — FUROSEMIDE 20 MG PO TABS: 20.0000 mg | ORAL_TABLET | Freq: Every day | ORAL | 0 refills | Status: DC

## 2017-04-07 MED ORDER — SALINE SPRAY 0.65 % NA SOLN
1.0000 | NASAL | 0 refills | Status: DC | PRN
Start: 2017-04-07 — End: 2018-09-12

## 2017-04-07 MED ORDER — OXYCODONE HCL 5 MG PO TABS: 5.0000 mg | ORAL_TABLET | ORAL | 0 refills | Status: DC | PRN

## 2017-04-07 MED ORDER — CEFUROXIME AXETIL 500 MG PO TABS: 500.0000 mg | ORAL_TABLET | Freq: Every day | ORAL | 0 refills | Status: DC

## 2017-04-07 MED ORDER — CEFUROXIME AXETIL 500 MG PO TABS
500.0000 mg | ORAL_TABLET | Freq: Two times a day (BID) | ORAL | Status: DC
  Administered 2017-04-07: 500 mg via ORAL
  Filled 2017-04-07 (×2): qty 1

## 2017-04-07 MED ORDER — CAMPHOR-MENTHOL 0.5-0.5 % EX LOTN: TOPICAL_LOTION | CUTANEOUS | 0 refills | Status: DC | PRN

## 2017-04-07 MED ORDER — AMLODIPINE BESYLATE 10 MG PO TABS: 10.0000 mg | ORAL_TABLET | Freq: Every day | ORAL | 0 refills | Status: DC

## 2017-04-07 NOTE — Progress Notes (Signed)
PHARMACY NOTE:  ANTIMICROBIAL RENAL DOSAGE ADJUSTMENT  Current antimicrobial regimen includes a mismatch between antimicrobial dosage and estimated renal function.  As per policy approved by the Pharmacy & Therapeutics and Medical Executive Committees, the antimicrobial dosage will be adjusted accordingly.  Current antimicrobial dosage:  Ceftin 500mg  PO daily  Indication: E.coli pyelo/bacteremia  Renal Function:  Estimated Creatinine Clearance: 34 mL/min (A) (by C-G formula based on SCr of 1.66 mg/dL (H)). []      On intermittent HD, scheduled: []      On CRRT    Antimicrobial dosage has been changed to:  Ceftin 500mg  PO BID  Additional comments:   Thank you for allowing pharmacy to be a part of this patient's care.   Trenesha Alcaide D. Mina Marble, PharmD, BCPS Pager:  [phone] 04/07/2017, 9:11 AM

## 2017-04-07 NOTE — Care Management Note (Signed)
Case Management Note  Patient Details  Name: TAYTEN HEBER MRN: [id number] Date of Birth: 05-24-1946  Subjective/Objective: Pt presented with sepsis, hx of recent fall. PT/OT recommendations for SNF. CSW assisting with disposition needs.                                  Action/Plan: Plan for SNF at [facility] today. No further needs from CM at this time.   Expected Discharge Date:  04/07/17               Expected Discharge Plan:  Skilled Nursing Facility  In-House Referral:  Clinical Social Work  Discharge planning Services  CM Consult  Post Acute Care Choice:  NA Choice offered to:  NA  DME Arranged:  N/A DME Agency:  NA  HH Arranged:  NA HH Agency:  NA  Status of Service:  Completed, signed off  If discussed at [employer] of Stay Meetings, dates discussed:    Additional Comments:  Bethena Roys, RN 04/07/2017, 11:34 AM

## 2017-04-07 NOTE — Discharge Summary (Signed)
Physician Discharge Summary  SADONNA KOTARA HYW:[id number] DOB: April 10, 1946 DOA: 03/30/2017  PCP: Leanna Battles, MD  Admit date: 03/30/2017 Discharge date: 04/07/2017  Time spent: 45 minutes  Recommendations for Outpatient Follow-up:  Patient will be discharged to skilled nursing facility. Continue physical and occupational therapy. Patient will need to follow up with primary care provider within one week of discharge, repeat CBC and BMP. Follow up with Dr. Ellard Artis, podiatry. Patient should continue medications as prescribed.  Patient should follow a heart healthy/carb modified diet. Use CAM boot with weightbearing/ambulation.   Discharge Diagnoses:  Severe sepsis secondary to Escherichia coli bacteremia/UTI/pyelonephritis Acute hypoxic/hypercarbic respiratory failure/obstructive sleep apnea Left sided pleural effusion Elevated troponin Acute encephalopathy Multiple left rib fractures secondary to recent fall Essential hypertension Acute on chronic diastolic heart failure Acute kidney injury on CKD, stage III Gout Diabetes mellitus, type II Recent left ankle fracture Left lower extremity DVT GERD Hyperlipidemia Restless leg syndrome Dermatitis Normocytic anemia  Discharge Condition: Stable  Diet recommendation: heart healthy/carb modified diet  Filed Weights   04/04/17 0400 04/05/17 0507 04/06/17 0706  Weight: 109.4 kg (241 lb 2.9 oz) 108.8 kg (239 lb 12.8 oz) 108.4 kg (238 lb 14.4 oz)    History of present illness:  on 03/30/2017 by Dr. Cresenciano Lick a 71 y.o.femalewith past medical history of diabetes, reflux, sleep apnea, osteopenia presented to the emergency room after fall downstairs. Patient states she fell and has pain on the left side. Came to emergency room for evaluation due to concern for possible injury. Has some CP post fall. Denies head injury with fall.  Hospital Course:  Severe sepsis secondary to Escherichia coli  bacteremia/UTI/pyelonephritis -Patient presented with fever, tachycardia, tachypnea, leukocytosis and encephalopathy, acute kidney injury -Blood cultures 6/21 show Escherichia coli (1/2) -repeated blood cultures on 6/26 show no growth to date -UA positive for UTI -Urine culture showed no growth however was obtained more than 24 hours after antibiotic initiation -Procalcitonin trending downward, lactic acid within normal limits -Patient was initially placed on vancomycin and Zosyn which have been transitioned to ceftriaxone -Will discharge patient with Ceftin 500mg  daily (renally adjusted)  Acute hypoxic/hypercarbic respiratory failure/obstructive sleep apnea -Possibly secondary to sepsis and volume overload -PCCM consulted and appreciated, was managing BiPAP -Overnight on 6/26-27, patient had oxygen desaturation. Patient was placed on BiPAP and tolerated it well.  -CXR obtained showing enlarging left pleural effusion.  -Discussed at length with patient the need for home oxygen and the use of CPAP at home. Patient does not like either, feels her "nose is stuffed".  Does not like using the saline as it goes down her throat. -Continue flonase and nasal saline, supplemental oxygen. Have ordered CPAP for this evening.  -Continue to encourage incentive spirometry - difficult due to patient's pain from rib fractures -O2 weaned down to 2L, and patient maintaining her O2 sats in the 90s. Suspect with improvement of her rib fractures/pain, her breathing will also improve. Will discharge patient with supplemental oxgyen, and may be weaned as tolerated at the nursing facility.   Left sided pleural effusion -Possibly related to sepsis and IVF fluid received during hospitalization vs CHF -CXR from overnight 6/26, showed enlargement of effusion as compared to CXR from 6/22. -IR consulted for thoracentesis on 6/28, yielding 662ml blood fluid (likely secondary to trauma given patient has multiple rib  fractures on the left complicated by Eliquis) -repeat CXR s/p procedure showed no PTX, small left pleural effusion (decreased), patchy left lung base opacity (decreased)-?atelecatasis vs  pneumonia. Patient will be discharged with ceftin for UTI, which should cover pna, if this is truly pneumonia.    Elevated troponin -Likely demand ischemia in the setting of severe sepsis and respiratory failure -Echocardiogram done on 03/11/2017, listed below -Troponin peaked to 0.18, and trended downward -Currently chest pain-free  Acute encephalopathy -Resolved, patient appears to be back to baseline -CT head with no acute findings -Suspect secondary to sepsis as well as hypercapnia/hypoxia  Multiple left rib fractures secondary to recent fall -X-ray shows significant rib fractures from ribs 3 through 8 -Encourage incentive spirometry and ambulation -Continue pain control as needed  Essential hypertension -Lisinopril held secondary to acute kidney injury -Continue amlodipine and lasix  Acute on chronic diastolic heart failure -Diuresis initially held due to worsening renal function -Echocardiogram on 03/11/2017 showed an EF of 06-30%, grade 1 diastolic dysfunction -Currently appears to be euvolemic -Patient did have some lower extremity edema and was given 1 dose of Lasix -Continue PO lasix 20mg  daily  -Monitor I/O, daily weights -Urine output over past 24hrs 3400cc  Acute kidney injury on CKD, stage III -Likely secondary to sepsis -Renal ultrasound showed increased renal echogenicity consistent with chronic renal disease, no hydronephrosis or acute renal abnormality -Creatinine peaked at 2.6, baseline approximately 1.8 -Currently creatinine 1.66 -repeat BMP in 1-2 weeks  Gout -Appears stable, continue allopurinol, colchicine, uloric  Diabetes mellitus, type II -Patient uses NPH at home -Continue Lantus, insulin sliding scale, CBG monitoring  Recent left ankle  fracture -Continue using CAM boot and follow-up with orthopedics as an outpatient -PT consulted and recommended SNF -Discussed with Dr. Amalia Hailey, podiatry, patient is to follow up with him as an outpatient. Patient is to use a CAM boot with weightbearing/ambulation. May remove CAM boot when sitting or in bed.   Left lower extremity DVT -Noted on lower ext doppler on 03/10/2017 -Continue Eliquis  GERD -Continue PPI  Hyperlipidemia -Continue statin  Restless leg syndrome -Continue Requip  Dermatitis -Improved with sarna  Normocytic anemia -Hemoglobin currently stable at 10.7, continue to monitor CBC -Baseline 11-12  Consultants PCCM Interventional radiology Podiatry, Dr. Amalia Hailey, via phone  Procedures  Abdominal ultrasound  Discharge Exam: Vitals:   04/07/17 0100 04/07/17 0558  BP: (!) 141/61 (!) 154/90  Pulse: 84 85  Resp: 14 (!) 21  Temp: 98.6 F (37 C) 97.9 F (36.6 C)   Patient continues to complain of pain and nasal dryness.  Does not feel that she has improved after the thoracentesis. Denies chest pain, abdominal pain, nausea, vomiting, headache, dizziness.    General: Well developed, well nourished, no distress  HEENT: NCAT,mucous membranes moist.  Cardiovascular: S1 S2 auscultated, RRR, no murmurs appreciated  Respiratory: Diminished however clear. No wheezing.   Abdomen: Soft, obese, nontender, nondistended, + bowel sounds  Extremities: warm dry without cyanosis clubbing or edema. CAM boot on LLE  Neuro: AAOx3, nonfocal  Psych: Anxious  Discharge Instructions Discharge Instructions    Discharge instructions    Complete by:  As directed    Patient will be discharged to skilled nursing facility. Continue physical and occupational therapy. Patient will need to follow up with primary care provider within one week of discharge, repeat CBC and BMP. Follow up with Dr. Ellard Artis, podiatry. Patient should continue medications as prescribed.  Patient should  follow a heart healthy/carb modified diet. Use CAM boot with weightbearing/ambulation.     Current Discharge Medication List    START taking these medications   Details  amLODipine (NORVASC) 10 MG tablet Take  1 tablet (10 mg total) by mouth daily. Qty: 30 tablet, Refills: 0    bisacodyl (DULCOLAX) 5 MG EC tablet Take 1 tablet (5 mg total) by mouth daily as needed for moderate constipation. Qty: 30 tablet, Refills: 0    camphor-menthol (SARNA) lotion Apply topically as needed for itching. Qty: 222 mL, Refills: 0    cefUROXime (CEFTIN) 500 MG tablet Take 1 tablet (500 mg total) by mouth daily. Qty: 5 tablet, Refills: 0    furosemide (LASIX) 20 MG tablet Take 1 tablet (20 mg total) by mouth daily. Qty: 30 tablet, Refills: 0    oxyCODONE (OXY IR/ROXICODONE) 5 MG immediate release tablet Take 1 tablet (5 mg total) by mouth every 4 (four) hours as needed for moderate pain. Qty: 10 tablet, Refills: 0    sodium chloride (OCEAN) 0.65 % SOLN nasal spray Place 1 spray into both nostrils as needed for congestion (nose irritation). Refills: 0      CONTINUE these medications which have NOT CHANGED   Details  apixaban (ELIQUIS) 5 MG TABS tablet Take 2 tablets (10 mg total) by mouth 2 (two) times daily. Then on 03/17/17, take 1 tabet (5 mg) two times daily. Qty: 69 tablet, Refills: 0    colchicine 0.6 MG tablet Take 0.6 mg by mouth daily.    cyclobenzaprine (FLEXERIL) 10 MG tablet Take 10 mg by mouth 2 (two) times daily.     lovastatin (MEVACOR) 40 MG tablet Take 40 mg by mouth daily.     omeprazole (PRILOSEC) 20 MG capsule Take 20 mg by mouth daily.    rOPINIRole (REQUIP) 4 MG tablet Take 4 mg by mouth 2 (two) times daily.     allopurinol (ZYLOPRIM) 100 MG tablet Take 100 mg by mouth daily.    ergocalciferol (VITAMIN D2) 50000 UNITS capsule Take 50,000 Units by mouth once a week. Patient takes on Fridays    febuxostat (ULORIC) 40 MG tablet Take 40 mg by mouth daily.    insulin NPH  Human (HUMULIN N,NOVOLIN N) 100 UNIT/ML injection Inject 15-75 Units into the skin See admin instructions. Use 65 units every morning then use 15 units at lunch then use 75 units at dinner    insulin regular (NOVOLIN R,HUMULIN R) 100 units/mL injection Inject 30 Units into the skin 3 (three) times daily before meals.      STOP taking these medications     gabapentin (NEURONTIN) 600 MG tablet      HYDROcodone-acetaminophen (NORCO) 7.5-325 MG tablet      nabumetone (RELAFEN) 750 MG tablet      gabapentin (NEURONTIN) 300 MG capsule      lisinopril (PRINIVIL,ZESTRIL) 5 MG tablet      Triamcinolone Acetonide (TRIAMCINOLONE 0.1 % CREAM : EUCERIN) CREA        No Known Allergies  Contact information for follow-up providers    Leanna Battles, MD. Schedule an appointment as soon as possible for a visit in 1 week(s).   Specialty:  Internal Medicine Why:  Hospital follow up Contact information: [place] [address] [phone]        Edrick Kins, DPM. Schedule an appointment as soon as possible for a visit in 1 week(s).   Specialty:  Podiatry Why:  Follow up for left ankle fracture Contact information: [address] [place] [address] [phone]            Contact information for after-discharge care    Destination    [facility]  Follow up.   Specialty:  [place] information: [address] [place] [phone]                   The results of significant diagnostics from this hospitalization (including imaging, microbiology, ancillary and laboratory) are listed below for reference.    Significant Diagnostic Studies: Dg Chest 1 View  Result Date: 04/06/2017 CLINICAL DATA:  Status post thoracentesis, dyspnea EXAM: CHEST 1 VIEW COMPARISON:  04/04/2017 chest radiograph. FINDINGS: Stable cardiomediastinal silhouette with mild cardiomegaly. No appreciable  pneumothorax. No right pleural effusion. Small left pleural effusion, decreased. Patchy left lung base opacity, decreased. No pulmonary edema. IMPRESSION: 1. No appreciable pneumothorax. 2. Small left pleural effusion, decreased. 3. Patchy left lung base opacity, decreased, likely representing atelectasis and/ or pneumonia. Recommend follow-up chest radiographs to resolution. Electronically Signed   By: Ilona Sorrel M.D.   On: 04/06/2017 13:10   Dg Chest 2 View  Result Date: 03/30/2017 CLINICAL DATA:  Shortness of breath since falling yesterday, LEFT arm pain, hypertension, diabetes mellitus, COPD, former smoker EXAM: CHEST  2 VIEW COMPARISON:  03/2017 FINDINGS: Enlargement of cardiac silhouette. Mediastinal contours and pulmonary vascularity normal. RIGHT basilar atelectasis. Underpenetration of LEFT lung base. Remaining lungs clear. Rotated to the LEFT. Central peribronchial thickening. No gross pleural effusion or pneumothorax. Bones demineralized. IMPRESSION: Enlargement of cardiac silhouette. Bronchitic changes with RIGHT basilar atelectasis. Electronically Signed   By: Lavonia Dana M.D.   On: 03/30/2017 13:01   Dg Chest 2 View  Result Date: 03/11/2017 CLINICAL DATA:  Acute onset of wheezing. Known DVT. Initial encounter. EXAM: CHEST  2 VIEW COMPARISON:  Chest radiograph performed 10/01/2009 FINDINGS: The lungs are well-aerated. Pulmonary vascularity is at the upper limits of normal. There is no evidence of focal opacification, pleural effusion or pneumothorax. The heart is normal in size; the mediastinal contour is within normal limits. No acute osseous abnormalities are seen. Anterior bridging osteophytes are noted along the lower thoracic spine. IMPRESSION: No acute cardiopulmonary process seen. Electronically Signed   By: Garald Balding M.D.   On: 03/11/2017 01:03   Dg Ribs Unilateral Left  Result Date: 04/01/2017 CLINICAL DATA:  Recent fall, left chest pain EXAM: LEFT RIBS - 2 VIEW COMPARISON:   03/31/2017 FINDINGS: Multiple acute left rib fractures involving the third through eighth ribs laterally. These are best demonstrated on the AP oblique view. Degenerative changes noted spine. Heart is mildly enlarged. Small left effusion and left basilar atelectasis/consolidation suspected. IMPRESSION: Acute left third through eighth rib fractures. No large pneumothorax Suspected small left effusion and left basilar atelectasis/consolidation Electronically Signed   By: Jerilynn Mages.  Shick M.D.   On: 04/01/2017 13:34   Ct Head Wo Contrast  Result Date: 03/31/2017 CLINICAL DATA:  Altered mental status. EXAM: CT HEAD WITHOUT CONTRAST TECHNIQUE: Contiguous axial images were obtained from the base of the skull through the vertex without intravenous contrast. COMPARISON:  CT scan of October 01, 2009. FINDINGS: Brain: Mild chronic ischemic white matter disease is noted. No mass effect or midline shift is noted. Ventricular size is within normal limits. There is no evidence of mass lesion, hemorrhage or acute infarction. Vascular: Atherosclerosis of carotid siphons is noted. Skull: Normal. Negative for fracture or focal lesion. Sinuses/Orbits: No acute finding. Other: None. IMPRESSION: Mild chronic ischemic white matter disease. No acute intracranial abnormality seen. Electronically Signed   By: Marijo Conception, M.D.   On: 03/31/2017 09:11   US Abdomen Complete  Result  Date: 03/31/2017 CLINICAL DATA:  Left flank pain.  Generalized abdominal pain. EXAM: ABDOMEN ULTRASOUND COMPLETE COMPARISON:  Abdomen 03/31/2017. Ultrasound 09/11/2015. CT 12/12/2012. CT 04/07/2010 FINDINGS: Gallbladder: No gallstones or wall thickening visualized. No sonographic Murphy sign noted by sonographer. Common bile duct: Diameter: 4.1 mm Liver: No focal lesion identified. Within normal limits in parenchymal echogenicity. IVC: No abnormality visualized. Pancreas: Visualized portion unremarkable. Spleen: Size and appearance within normal limits. Right  Kidney: Length: 10.7 cm. Cortical thinning . Increased echogenicity. 1.1 cm stable simple cyst. No hydronephrosis visualized. Left Kidney: Length: 11.1 cm. Cortical thinning. Increased echogenicity. No mass or hydronephrosis visualized. Abdominal aorta: No aneurysm visualized. Other findings: None. IMPRESSION: 1. Increased renal echogenicity consistent with chronic renal disease. Bilateral renal atrophy again noted. No acute renal abnormality. No hydronephrosis. 2.  No gallstones or biliary distention. Electronically Signed   By: Marcello Moores  Register   On: 03/31/2017 11:14   Dg Hand 2 View Left  Result Date: 04/01/2017 CLINICAL DATA:  Fall down to stairs with left hand pain. EXAM: LEFT HAND - 2 VIEW COMPARISON:  None. FINDINGS: Pulse oximeter obscures the fourth distal phalanx. Mild degenerative change over the radiocarpal joint and carpal bones as well as the third MCP joint. No acute fracture or dislocation. IMPRESSION: No acute findings. Electronically Signed   By: Marin Olp M.D.   On: 04/01/2017 13:37   Dg Chest Port 1 View  Result Date: 04/04/2017 CLINICAL DATA:  71 year old female with tachypnea and hypoxia. EXAM: PORTABLE CHEST 1 VIEW COMPARISON:  04/01/2017 FINDINGS: Increased density overlying the left hemithorax likely representing an enlarging left pleural effusion. Left lower lung consolidation/atelectasis has increased. There is no evidence of pneumothorax. Rib fractures again noted. The right lung is clear. IMPRESSION: Increased density overlying the left hemithorax likely representing an enlarging left pleural effusion. Increasing left lower lung consolidation/atelectasis. Electronically Signed   By: Margarette Canada M.D.   On: 04/04/2017 23:49   Dg Chest Port 1 View  Result Date: 03/31/2017 CLINICAL DATA:  Initial evaluation for acute respiratory distress. EXAM: PORTABLE CHEST 1 VIEW COMPARISON:  Prior radiograph from 03/30/2017. FINDINGS: Study limited by patient positioning. Grossly stable  cardiomegaly. Mediastinal silhouette within normal limits. Lungs hypoinflated. Increased pulmonary vascularity with interstitial prominence, suggestive of congestion without frank pulmonary edema. Patchy bibasilar opacities favored to reflect atelectasis/ bronchovascular crowding and/ or congestion. Superimposed infiltrates would be difficult to exclude. Suspected left pleural effusion. No pneumothorax. No acute osseus abnormality. Remote right-sided rib fractures noted. IMPRESSION: 1. Stable cardiomegaly with increased pulmonary vascular congestion and interstitial prominence as compared to previous, consistent with pulmonary vascular congestion. No frank pulmonary edema. 2. Suspected left pleural effusion. 3. Shallow lung inflation. Patchy bibasilar opacities favored to reflect atelectasis/bronchovascular crowding and/or edema. Superimposed infiltrates would be difficult to exclude, and could be considered in the correct clinical setting. Electronically Signed   By: Jeannine Boga M.D.   On: 03/31/2017 04:45   Dg Shoulder Left  Result Date: 04/01/2017 CLINICAL DATA:  Fall down to stairs with left shoulder pain. EXAM: LEFT SHOULDER - 2+ VIEW COMPARISON:  Chest x-ray 03/30/2017 FINDINGS: There are degenerative changes of the [facility] joint and glenohumeral joints. There is no acute fracture or dislocation over the shoulder girdle. Evidence of fracture of the left lateral third through sixth ribs. Degenerate change of the spine. IMPRESSION: Degenerative change of the shoulder without acute injury. Acute fractures of the left lateral third through sixth ribs. Electronically Signed   By: Marin Olp M.D.   On:  04/01/2017 13:39   Dg Abd Portable 1v  Result Date: 03/31/2017 CLINICAL DATA:  Abdominal pain and distention. EXAM: PORTABLE ABDOMEN - 1 VIEW COMPARISON:  CT abdomen and pelvis 12/12/2012. FINDINGS: Image quality is degraded by patient body habitus. The far left lateral aspect of the abdomen was  incompletely imaged. No gross intraperitoneal free air is identified on this supine study. Gas and a small amount of stool are present in the colon. No dilated loops of bowel are seen to suggest obstruction. 3 cm oval calcification in the right pelvis corresponds to a chronic peripherally calcified right ovarian lesion, grossly similar in size to the prior CT. Asymmetric right hip osteoarthrosis is noted, and there is diffuse thoracolumbar spondylosis. Asymmetric left basilar lung opacity is more fully evaluated on today's earlier chest radiographs. IMPRESSION: Nonobstructed bowel gas pattern. Electronically Signed   By: Logan Bores M.D.   On: 03/31/2017 07:26   Dg Hip Unilat With Pelvis 2-3 Views Left  Result Date: 04/01/2017 CLINICAL DATA:  Fall down 2 stairs with left hip pain. EXAM: DG HIP (WITH OR WITHOUT PELVIS) 2-3V LEFT COMPARISON:  03/31/2017 as well as CT 12/12/2012 FINDINGS: There is diffuse decreased bone mineralization. 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 No acute fracture or dislocation. Degenerative change of the spine. Oval rim calcified right adnexal structure unchanged. IMPRESSION: No acute findings per Osteoarthritic change of the hips right worse than left. Electronically Signed   By: Marin Olp M.D.   On: 04/01/2017 13:35   Ir Thoracentesis Asp Pleural Space W/img Guide  Result Date: 04/06/2017 INDICATION: Patient status post mechanical fall, now with left pleural effusion. Request is made for diagnostic and therapeutic thoracentesis. EXAM: ULTRASOUND GUIDED DIAGNOSTIC AND THERAPEUTIC LEFT THORACENTESIS MEDICATIONS: 10 mL 1% lidocaine COMPLICATIONS: None immediate. PROCEDURE: An ultrasound guided thoracentesis was thoroughly discussed with the patient and questions answered. The benefits, risks, alternatives and complications were also discussed. The patient understands and wishes to proceed with the procedure. Written consent  was obtained. Ultrasound was performed to localize and mark an adequate pocket of fluid in the left chest. The area was then prepped and draped in the normal sterile fashion. 1% Lidocaine was used for local anesthesia. Under ultrasound guidance a Safe-T-centesis catheter was introduced. Thoracentesis was performed. The catheter was removed and a dressing applied. FINDINGS: A total of approximately 650 mL of bloody fluid was removed. Samples were sent to the laboratory as requested by the clinical team. IMPRESSION: Successful ultrasound guided diagnostic and therapeutic left thoracentesis yielding 650 mL of pleural fluid. Read by:  Brynda Greathouse PA-C Electronically Signed   By: Sandi Mariscal M.D.   On: 04/06/2017 14:59    Microbiology: Recent Results (from the past 240 hour(s))  Culture, blood (Routine x 2)     Status: Abnormal   Collection Time: 03/30/17 12:28 PM  Result Value Ref Range Status   Specimen Description BLOOD RIGHT FOREARM  Final   Special Requests   Final    BOTTLES DRAWN AEROBIC AND ANAEROBIC Blood Culture adequate volume   Culture  Setup Time   Final    GRAM NEGATIVE RODS ANAEROBIC BOTTLE ONLY CRITICAL RESULT CALLED TO, READ BACK BY AND VERIFIED WITH: PHARMD T STONE [id number] MLM    Culture ESCHERICHIA COLI (A)  Final   Report Status 04/02/2017 FINAL  Final   Organism ID, Bacteria ESCHERICHIA COLI  Final      Susceptibility   Escherichia coli - MIC*  AMPICILLIN >=32 RESISTANT Resistant     CEFAZOLIN <=4 SENSITIVE Sensitive     CEFEPIME <=1 SENSITIVE Sensitive     CEFTAZIDIME <=1 SENSITIVE Sensitive     CEFTRIAXONE <=1 SENSITIVE Sensitive     CIPROFLOXACIN <=0.25 SENSITIVE Sensitive     GENTAMICIN <=1 SENSITIVE Sensitive     IMIPENEM <=0.25 SENSITIVE Sensitive     TRIMETH/SULFA <=20 SENSITIVE Sensitive     AMPICILLIN/SULBACTAM 16 INTERMEDIATE Intermediate     PIP/TAZO <=4 SENSITIVE Sensitive     Extended ESBL NEGATIVE Sensitive     * ESCHERICHIA COLI  Blood Culture  ID Panel (Reflexed)     Status: Abnormal   Collection Time: 03/30/17 12:28 PM  Result Value Ref Range Status   Enterococcus species NOT DETECTED NOT DETECTED Final   Listeria monocytogenes NOT DETECTED NOT DETECTED Final   Staphylococcus species NOT DETECTED NOT DETECTED Final   Staphylococcus aureus NOT DETECTED NOT DETECTED Final   Streptococcus species NOT DETECTED NOT DETECTED Final   Streptococcus agalactiae NOT DETECTED NOT DETECTED Final   Streptococcus pneumoniae NOT DETECTED NOT DETECTED Final   Streptococcus pyogenes NOT DETECTED NOT DETECTED Final   Acinetobacter baumannii NOT DETECTED NOT DETECTED Final   Enterobacteriaceae species DETECTED (A) NOT DETECTED Final    Comment: Enterobacteriaceae represent a large family of gram-negative bacteria, not a single organism. CRITICAL RESULT CALLED TO, READ BACK BY AND VERIFIED WITH: PHARMD T STONE [id number] MLM    Enterobacter cloacae complex NOT DETECTED NOT DETECTED Final   Escherichia coli DETECTED (A) NOT DETECTED Final    Comment: CRITICAL RESULT CALLED TO, READ BACK BY AND VERIFIED WITH: PHARMD T STONE [id number] MLM    Klebsiella oxytoca NOT DETECTED NOT DETECTED Final   Klebsiella pneumoniae NOT DETECTED NOT DETECTED Final   Proteus species NOT DETECTED NOT DETECTED Final   Serratia marcescens NOT DETECTED NOT DETECTED Final   Carbapenem resistance NOT DETECTED NOT DETECTED Final   Haemophilus influenzae NOT DETECTED NOT DETECTED Final   Neisseria meningitidis NOT DETECTED NOT DETECTED Final   Pseudomonas aeruginosa NOT DETECTED NOT DETECTED Final   Candida albicans NOT DETECTED NOT DETECTED Final   Candida glabrata NOT DETECTED NOT DETECTED Final   Candida krusei NOT DETECTED NOT DETECTED Final   Candida parapsilosis NOT DETECTED NOT DETECTED Final   Candida tropicalis NOT DETECTED NOT DETECTED Final  Culture, blood (Routine x 2)     Status: None   Collection Time: 03/30/17  1:11 PM  Result Value Ref Range Status     Specimen Description BLOOD RIGHT HAND  Final   Special Requests   Final    BOTTLES DRAWN AEROBIC AND ANAEROBIC Blood Culture adequate volume   Culture NO GROWTH 5 DAYS  Final   Report Status 04/04/2017 FINAL  Final  MRSA PCR Screening     Status: None   Collection Time: 03/31/17  5:32 AM  Result Value Ref Range Status   MRSA by PCR NEGATIVE NEGATIVE Final    Comment:        The GeneXpert MRSA Assay (FDA approved for NASAL specimens only), is one component of a comprehensive MRSA colonization surveillance program. It is not intended to diagnose MRSA infection nor to guide or monitor treatment for MRSA infections.   Culture, Urine     Status: None   Collection Time: 04/01/17 11:29 AM  Result Value Ref Range Status   Specimen Description URINE, CATHETERIZED  Final   Special Requests NONE  Final   Culture NO  GROWTH  Final   Report Status 04/02/2017 FINAL  Final  Culture, blood (routine x 2)     Status: None (Preliminary result)   Collection Time: 04/04/17 11:08 AM  Result Value Ref Range Status   Specimen Description BLOOD RIGHT ANTECUBITAL  Final   Special Requests IN PEDIATRIC BOTTLE Blood Culture adequate volume  Final   Culture NO GROWTH 2 DAYS  Final   Report Status PENDING  Incomplete  Culture, blood (routine x 2)     Status: None (Preliminary result)   Collection Time: 04/04/17 11:10 AM  Result Value Ref Range Status   Specimen Description BLOOD RIGHT HAND  Final   Special Requests IN PEDIATRIC BOTTLE Blood Culture adequate volume  Final   Culture NO GROWTH 2 DAYS  Final   Report Status PENDING  Incomplete  Gram stain     Status: None (Preliminary result)   Collection Time: 04/06/17 12:48 PM  Result Value Ref Range Status   Specimen Description FLUID LEFT PLEURAL  Final   Special Requests NONE  Final   Gram Stain NO WBC SEEN NO ORGANISMS SEEN   Final   Report Status PENDING  Incomplete     Labs: Basic Metabolic Panel:  Recent Labs Lab 04/01/17 0207   04/02/17 0218 04/03/17 0218 04/04/17 0553 04/05/17 0515 04/06/17 0442 04/07/17 0323  NA 143  < > 141 140 139 139 139 137  K 4.5  < > 4.4 4.3 3.9 4.6 4.1 3.9  CL 104  < > 103 102 102 101 99* 98*  CO2 28  < > 30 30 29 29 31 31   GLUCOSE 132*  < > 126* 160* 233* 250* 285* 323*  BUN 39*  < > 36* 33* 30* 32* 34* 33*  CREATININE 2.66*  < > 2.18* 1.80* 1.60* 1.75* 1.70* 1.66*  CALCIUM 8.0*  < > 8.5* 8.7* 8.7* 9.1 9.1 8.7*  MG 2.2  --  2.2  --   --   --   --   --   < > = values in this interval not displayed. Liver Function Tests: No results for input(s): AST, ALT, ALKPHOS, BILITOT, PROT, ALBUMIN in the last 168 hours. No results for input(s): LIPASE, AMYLASE in the last 168 hours. No results for input(s): AMMONIA in the last 168 hours. CBC:  Recent Labs Lab 04/03/17 0218 04/04/17 0553 04/05/17 0515 04/06/17 0442 04/07/17 0323  WBC 12.1* 10.9* 11.1* 10.5 10.3  HGB 10.7* 10.9* 10.9* 10.7* 10.7*  HCT 34.8* 34.9* 36.3 35.1* 35.8*  MCV 95.1 94.1 96.5 94.1 94.7  PLT 298 308 351 370 367   Cardiac Enzymes:  Recent Labs Lab 03/31/17 1234 03/31/17 1849  TROPONINI 0.05* 0.03*   BNP: BNP (last 3 results) No results for input(s): BNP in the last 8760 hours.  ProBNP (last 3 results) No results for input(s): PROBNP in the last 8760 hours.  CBG:  Recent Labs Lab 04/05/17 2129 04/06/17 0809 04/06/17 1706 04/06/17 2003 04/07/17 0757  GLUCAP 342* 273* 324* 293* 301*       Signed:  Cristal Ford  [facility] 04/07/2017, 11:24 AM

## 2017-04-07 NOTE — Progress Notes (Signed)
Patient will discharge to [facility] Anticipated discharge date: 04/07/17 Family notified: Annett Gula, brother Transportation by: [facility]   CSW signing off.  Estanislado Emms, [place]  Clinical Social Worker

## 2017-04-07 NOTE — Discharge Instructions (Signed)
Information on my medicine - ELIQUIS (apixaban) Why was Eliquis prescribed for you? Eliquis was prescribed to treat blood clots that may have been found in the veins of your legs (deep vein thrombosis) or in your lungs (pulmonary embolism) and to reduce the risk of them occurring again.  What do You need to know about Eliquis ? The starting dose is 10 mg (two 5 mg tablets) taken TWICE daily for the FIRST SEVEN (7) DAYS, then on 03/17/17  the dose is reduced to ONE 5 mg tablet taken TWICE daily.  Eliquis may be taken with or without food.   Try to take the dose about the same time in the morning and in the evening. If you have difficulty swallowing the tablet whole please discuss with your pharmacist how to take the medication safely.  Take Eliquis exactly as prescribed and DO NOT stop taking Eliquis without talking to the doctor who prescribed the medication.  Stopping may increase your risk of developing a new blood clot.  Refill your prescription before you run out.  After discharge, you should have regular check-up appointments with your healthcare provider that is prescribing your Eliquis.    What do you do if you miss a dose? If a dose of ELIQUIS is not taken at the scheduled time, take it as soon as possible on the same day and twice-daily administration should be resumed. The dose should not be doubled to make up for a missed dose.  Important Safety Information A possible side effect of Eliquis is bleeding. You should call your healthcare provider right away if you experience any of the following: ? Bleeding from an injury or your nose that does not stop. ? Unusual colored urine (red or dark brown) or unusual colored stools (red or black). ? Unusual bruising for unknown reasons. ? A serious fall or if you hit your head (even if there is no bleeding).  Some medicines may interact with Eliquis and might increase your risk of bleeding or clotting while on Eliquis. To help avoid  this, consult your healthcare provider or pharmacist prior to using any new prescription or non-prescription medications, including herbals, vitamins, non-steroidal anti-inflammatory drugs (NSAIDs) and supplements.  This website has more information on Eliquis (apixaban): http://www.eliquis.com/eliquis/home

## 2017-04-07 NOTE — Procedures (Signed)
Placed patient on BIPAP for the night.  Patient is tolerating well at this time. 

## 2017-04-07 NOTE — Progress Notes (Signed)
   Results for LANISA, ISHLER (MRN [id number]) as of 04/07/2017 10:54  Ref. Range 04/05/2017 21:29 04/06/2017 08:09 04/06/2017 17:06 04/06/2017 20:03 04/07/2017 07:57  Glucose-Capillary Latest Ref Range: 65 - 99 mg/dL 342 (H) 273 (H) 324 (H) 293 (H) 301 (H)   Recommend increasing Lantus to 30 units daily and increasing Novolog to MODERATE correction scale TID & HS if eating.  Will continue to monitor blood sugars while in the hospital.   Since patient has had Lantus 20 units today, could add Lantus 10 units = Lantus 30 units daily.   Harvel Ricks RN BSN CDE Diabetes Coordinator Pager: [phone]  8am-5pm

## 2017-04-07 NOTE — Clinical Social Work Placement (Signed)
   CLINICAL SOCIAL WORK PLACEMENT  NOTE  Date:  04/07/2017  Patient Details  Name: Jean Mcclain MRN: [id number] Date of Birth: [birth year]  Clinical Social Work is seeking post-discharge placement for this patient at the [place] level of care (*CSW will initial, date and re-position this form in  chart as items are completed):  Yes   Patient/family provided with [place] Work Department's list of facilities offering this level of care within the geographic area requested by the patient (or if unable, by the patient's family).  Yes   Patient/family informed of their freedom to choose among providers that offer the needed level of care, that participate in Medicare, Medicaid or managed care program needed by the patient, have an available bed and are willing to accept the patient.  Yes   Patient/family informed of [facility]'s ownership interest in [facility] and [facility], as well as of the fact that they are under no obligation to receive care at these facilities.  PASRR submitted to EDS on       PASRR number received on       Existing PASRR number confirmed on 04/03/17     FL2 transmitted to all facilities in geographic area requested by pt/family on 04/03/17     FL2 transmitted to all facilities within larger geographic area on       Patient informed that his/her managed care company has contracts with or will negotiate with certain facilities, including the following:  [facility]     Yes   Patient/family informed of bed offers received.  Patient chooses bed at [facility]     Physician recommends and patient chooses bed at      Patient to be transferred to [facility] on 04/07/17.  Patient to be transferred to facility by [facility]     Patient family notified on 04/07/17 of transfer.  Name of family member notified:  Annett Gula, brother     PHYSICIAN Please sign FL2, Please prepare priority discharge summary, including  medications     Additional Comment:    _______________________________________________ Estanislado Emms, LCSW 04/07/2017, 10:43 AM

## 2017-04-08 DIAGNOSIS — M109 Gout, unspecified: Secondary | ICD-10-CM | POA: Diagnosis not present

## 2017-04-08 LAB — GRAM STAIN: Gram Stain: NONE SEEN

## 2017-04-09 DIAGNOSIS — R0603 Acute respiratory distress: Secondary | ICD-10-CM | POA: Diagnosis not present

## 2017-04-09 DIAGNOSIS — S2242XD Multiple fractures of ribs, left side, subsequent encounter for fracture with routine healing: Secondary | ICD-10-CM | POA: Diagnosis not present

## 2017-04-09 DIAGNOSIS — J90 Pleural effusion, not elsewhere classified: Secondary | ICD-10-CM | POA: Diagnosis not present

## 2017-04-09 DIAGNOSIS — M109 Gout, unspecified: Secondary | ICD-10-CM | POA: Diagnosis not present

## 2017-04-09 DIAGNOSIS — I509 Heart failure, unspecified: Secondary | ICD-10-CM | POA: Diagnosis not present

## 2017-04-09 DIAGNOSIS — M25572 Pain in left ankle and joints of left foot: Secondary | ICD-10-CM | POA: Diagnosis not present

## 2017-04-09 DIAGNOSIS — R278 Other lack of coordination: Secondary | ICD-10-CM | POA: Diagnosis not present

## 2017-04-09 DIAGNOSIS — N390 Urinary tract infection, site not specified: Secondary | ICD-10-CM | POA: Diagnosis not present

## 2017-04-09 DIAGNOSIS — R52 Pain, unspecified: Secondary | ICD-10-CM | POA: Diagnosis not present

## 2017-04-09 DIAGNOSIS — R0781 Pleurodynia: Secondary | ICD-10-CM | POA: Diagnosis not present

## 2017-04-09 DIAGNOSIS — M6281 Muscle weakness (generalized): Secondary | ICD-10-CM | POA: Diagnosis not present

## 2017-04-09 DIAGNOSIS — I10 Essential (primary) hypertension: Secondary | ICD-10-CM | POA: Diagnosis not present

## 2017-04-09 DIAGNOSIS — M19072 Primary osteoarthritis, left ankle and foot: Secondary | ICD-10-CM | POA: Diagnosis not present

## 2017-04-09 DIAGNOSIS — R4182 Altered mental status, unspecified: Secondary | ICD-10-CM | POA: Diagnosis not present

## 2017-04-09 DIAGNOSIS — Z9181 History of falling: Secondary | ICD-10-CM | POA: Diagnosis not present

## 2017-04-09 DIAGNOSIS — N179 Acute kidney failure, unspecified: Secondary | ICD-10-CM | POA: Diagnosis not present

## 2017-04-09 DIAGNOSIS — R2689 Other abnormalities of gait and mobility: Secondary | ICD-10-CM | POA: Diagnosis not present

## 2017-04-09 DIAGNOSIS — E119 Type 2 diabetes mellitus without complications: Secondary | ICD-10-CM | POA: Diagnosis not present

## 2017-04-09 DIAGNOSIS — I872 Venous insufficiency (chronic) (peripheral): Secondary | ICD-10-CM | POA: Diagnosis not present

## 2017-04-09 DIAGNOSIS — I82402 Acute embolism and thrombosis of unspecified deep veins of left lower extremity: Secondary | ICD-10-CM | POA: Diagnosis not present

## 2017-04-09 DIAGNOSIS — R319 Hematuria, unspecified: Secondary | ICD-10-CM | POA: Diagnosis not present

## 2017-04-09 DIAGNOSIS — A419 Sepsis, unspecified organism: Secondary | ICD-10-CM | POA: Diagnosis not present

## 2017-04-09 DIAGNOSIS — K219 Gastro-esophageal reflux disease without esophagitis: Secondary | ICD-10-CM | POA: Diagnosis not present

## 2017-04-09 LAB — CULTURE, BLOOD (ROUTINE X 2)
Culture: NO GROWTH
Culture: NO GROWTH
Special Requests: ADEQUATE
Special Requests: ADEQUATE

## 2017-04-11 LAB — CULTURE, BODY FLUID W GRAM STAIN -BOTTLE: Culture: NO GROWTH

## 2017-04-13 LAB — ACID FAST SMEAR (AFB, MYCOBACTERIA): Acid Fast Smear: NEGATIVE

## 2017-04-17 DIAGNOSIS — R0781 Pleurodynia: Secondary | ICD-10-CM | POA: Diagnosis not present

## 2017-04-17 DIAGNOSIS — I872 Venous insufficiency (chronic) (peripheral): Secondary | ICD-10-CM | POA: Diagnosis not present

## 2017-04-24 ENCOUNTER — Ambulatory Visit (INDEPENDENT_AMBULATORY_CARE_PROVIDER_SITE_OTHER): Payer: PPO | Admitting: Podiatry

## 2017-04-24 ENCOUNTER — Ambulatory Visit (INDEPENDENT_AMBULATORY_CARE_PROVIDER_SITE_OTHER): Payer: PPO

## 2017-04-24 DIAGNOSIS — M19072 Primary osteoarthritis, left ankle and foot: Secondary | ICD-10-CM

## 2017-04-24 DIAGNOSIS — M25572 Pain in left ankle and joints of left foot: Secondary | ICD-10-CM

## 2017-04-30 NOTE — Progress Notes (Signed)
   HPI: Patient presents today for follow-up treatment and evaluation of left ankle pain. Patient recently was diagnosed with an acute DVT left lower extremity. Patient is currently taking out quest. Patient states that she continues to have left ankle pain if she does not wear the boot. Patient presents today for further treatment and evaluation   Physical Exam: General: The patient is alert and oriented x3 in no acute distress.  Dermatology: Skin is warm, dry and supple bilateral lower extremities. Negative for open lesions or macerations.  Vascular: Palpable pedal pulses bilaterally. No edema or erythema noted. Capillary refill within normal limits.  Neurological: Epicritic and protective threshold grossly intact bilaterally.   Musculoskeletal Exam: Pain on palpation noted throughout the left midfoot and ankle consistent with severe DJD and arthritic changes. Range of motion within normal limits to all pedal and ankle joints bilateral. Muscle strength 5/5 in all groups bilateral.   Radiographic Exam:  Severe DJD with joint space narrowing and arthritic changes noted throughout the left foot and ankle joints  Assessment: 1. DJD left midfoot/ankle   Plan of Care:  1. Patient was evaluated. X-rays reviewed today 2. Appointment with Liliane Channel to be molded for custom AFO. The patient this should help to alleviate her pain and support her foot and ankle structure 3. Return to clinic when necessary   Edrick Kins, DPM [facility]  Dr. Edrick Kins, DPM    [address], [place] [address]                Office [phone]  Fax [phone]

## 2017-05-03 ENCOUNTER — Ambulatory Visit: Payer: PPO | Admitting: Orthotics

## 2017-05-03 DIAGNOSIS — M25572 Pain in left ankle and joints of left foot: Secondary | ICD-10-CM

## 2017-05-03 NOTE — Progress Notes (Signed)
Patient came in today for [place]o AFO evaluation/casting.  Patient presents with severe DJD left and accompanying pitted edema.  Patient states her edema is worse in the afternoon.  Given the amount of edema, it was decided that she would needed to reschedule

## 2017-05-11 ENCOUNTER — Ambulatory Visit: Payer: PPO | Admitting: Orthotics

## 2017-05-11 DIAGNOSIS — M25572 Pain in left ankle and joints of left foot: Secondary | ICD-10-CM

## 2017-05-11 DIAGNOSIS — M19072 Primary osteoarthritis, left ankle and foot: Secondary | ICD-10-CM

## 2017-05-11 DIAGNOSIS — M76822 Posterior tibial tendinitis, left leg: Secondary | ICD-10-CM

## 2017-05-11 DIAGNOSIS — I824Z2 Acute embolism and thrombosis of unspecified deep veins of left distal lower extremity: Secondary | ICD-10-CM

## 2017-05-11 NOTE — Progress Notes (Signed)
Patient came in today to be cast for an AFO standard brace per Dr. Amalia Hailey.  Patient has severe DJD left with narrowing spaces joints mid and hindfoot.  Patient was cast with STS sock and tolerated procedures reasonably well.   Patient has [employer] insurance and prior Josem Kaufmann will be gotten prior to faberication.

## 2017-05-22 DIAGNOSIS — R278 Other lack of coordination: Secondary | ICD-10-CM | POA: Diagnosis not present

## 2017-05-22 DIAGNOSIS — M6281 Muscle weakness (generalized): Secondary | ICD-10-CM | POA: Diagnosis not present

## 2017-05-22 DIAGNOSIS — R2689 Other abnormalities of gait and mobility: Secondary | ICD-10-CM | POA: Diagnosis not present

## 2017-05-22 DIAGNOSIS — Z9181 History of falling: Secondary | ICD-10-CM | POA: Diagnosis not present

## 2017-05-22 DIAGNOSIS — S2242XD Multiple fractures of ribs, left side, subsequent encounter for fracture with routine healing: Secondary | ICD-10-CM | POA: Diagnosis not present

## 2017-05-22 DIAGNOSIS — N390 Urinary tract infection, site not specified: Secondary | ICD-10-CM | POA: Diagnosis not present

## 2017-06-06 ENCOUNTER — Ambulatory Visit: Payer: PPO | Admitting: Sports Medicine

## 2017-06-06 ENCOUNTER — Telehealth: Payer: Self-pay | Admitting: Podiatry

## 2017-06-06 NOTE — Telephone Encounter (Signed)
Yes, someone just called me and I don't know what it was about. If you could call me back or I can try calling you back. My number is [phone].

## 2017-06-06 NOTE — Telephone Encounter (Signed)
Tried calling pt back in regards to message she left. Left message letting her know that it was the automated reminder calling her to remind her of her appointment with our pedorthist Rick at 1:30 pm for a 4 week follow up. Told her if she had any other questions to call our office back at [phone] and to choose option 2 for appointments.

## 2017-06-07 ENCOUNTER — Other Ambulatory Visit: Payer: PPO | Admitting: Orthotics

## 2017-06-07 DIAGNOSIS — S2242XD Multiple fractures of ribs, left side, subsequent encounter for fracture with routine healing: Secondary | ICD-10-CM | POA: Diagnosis not present

## 2017-06-07 DIAGNOSIS — M109 Gout, unspecified: Secondary | ICD-10-CM | POA: Diagnosis not present

## 2017-06-07 DIAGNOSIS — S82892D Other fracture of left lower leg, subsequent encounter for closed fracture with routine healing: Secondary | ICD-10-CM | POA: Diagnosis not present

## 2017-06-07 DIAGNOSIS — E559 Vitamin D deficiency, unspecified: Secondary | ICD-10-CM | POA: Diagnosis not present

## 2017-06-07 DIAGNOSIS — I872 Venous insufficiency (chronic) (peripheral): Secondary | ICD-10-CM | POA: Diagnosis not present

## 2017-06-07 DIAGNOSIS — G4733 Obstructive sleep apnea (adult) (pediatric): Secondary | ICD-10-CM | POA: Diagnosis not present

## 2017-06-08 ENCOUNTER — Other Ambulatory Visit: Payer: PPO | Admitting: Orthotics

## 2017-06-08 ENCOUNTER — Ambulatory Visit (INDEPENDENT_AMBULATORY_CARE_PROVIDER_SITE_OTHER): Payer: PPO | Admitting: Orthotics

## 2017-06-08 DIAGNOSIS — M19072 Primary osteoarthritis, left ankle and foot: Secondary | ICD-10-CM

## 2017-06-08 DIAGNOSIS — M79671 Pain in right foot: Secondary | ICD-10-CM

## 2017-06-08 DIAGNOSIS — M25572 Pain in left ankle and joints of left foot: Secondary | ICD-10-CM

## 2017-06-08 DIAGNOSIS — M79672 Pain in left foot: Secondary | ICD-10-CM

## 2017-06-08 DIAGNOSIS — I824Z2 Acute embolism and thrombosis of unspecified deep veins of left distal lower extremity: Secondary | ICD-10-CM

## 2017-06-08 DIAGNOSIS — M76822 Posterior tibial tendinitis, left leg: Secondary | ICD-10-CM

## 2017-06-08 LAB — ACID FAST CULTURE WITH REFLEXED SENSITIVITIES (MYCOBACTERIA): Acid Fast Culture: NEGATIVE

## 2017-06-08 NOTE — Progress Notes (Signed)
Patient  came in to pick up [place].  The brace fit well and immediately patient's  gait approved when she got out of her wheelchair.  She was advised to work with physical therapy. .  The brace provided desired m-l stability in frontal/transverse planes and aided in dorsiflexion in saggital plane. Patient was able to don and doff brace but had some difficulty.  She said she had help at the assisted living to get brace on.  Overall patient pleased with fit and functionality of brace.    [place] was given to her gratis.

## 2017-06-13 DIAGNOSIS — S82892D Other fracture of left lower leg, subsequent encounter for closed fracture with routine healing: Secondary | ICD-10-CM | POA: Diagnosis not present

## 2017-06-13 DIAGNOSIS — E559 Vitamin D deficiency, unspecified: Secondary | ICD-10-CM | POA: Diagnosis not present

## 2017-06-13 DIAGNOSIS — I872 Venous insufficiency (chronic) (peripheral): Secondary | ICD-10-CM | POA: Diagnosis not present

## 2017-06-13 DIAGNOSIS — M109 Gout, unspecified: Secondary | ICD-10-CM | POA: Diagnosis not present

## 2017-06-13 DIAGNOSIS — G4733 Obstructive sleep apnea (adult) (pediatric): Secondary | ICD-10-CM | POA: Diagnosis not present

## 2017-06-13 DIAGNOSIS — S2242XD Multiple fractures of ribs, left side, subsequent encounter for fracture with routine healing: Secondary | ICD-10-CM | POA: Diagnosis not present

## 2017-06-19 ENCOUNTER — Ambulatory Visit (INDEPENDENT_AMBULATORY_CARE_PROVIDER_SITE_OTHER): Payer: PPO | Admitting: Podiatry

## 2017-06-19 DIAGNOSIS — M659 Synovitis and tenosynovitis, unspecified: Secondary | ICD-10-CM | POA: Diagnosis not present

## 2017-06-19 DIAGNOSIS — M2140 Flat foot [pes planus] (acquired), unspecified foot: Secondary | ICD-10-CM

## 2017-06-19 DIAGNOSIS — M76829 Posterior tibial tendinitis, unspecified leg: Secondary | ICD-10-CM

## 2017-06-21 DIAGNOSIS — S2242XD Multiple fractures of ribs, left side, subsequent encounter for fracture with routine healing: Secondary | ICD-10-CM | POA: Diagnosis not present

## 2017-06-21 DIAGNOSIS — I872 Venous insufficiency (chronic) (peripheral): Secondary | ICD-10-CM | POA: Diagnosis not present

## 2017-06-21 DIAGNOSIS — S82892D Other fracture of left lower leg, subsequent encounter for closed fracture with routine healing: Secondary | ICD-10-CM | POA: Diagnosis not present

## 2017-06-21 DIAGNOSIS — E559 Vitamin D deficiency, unspecified: Secondary | ICD-10-CM | POA: Diagnosis not present

## 2017-06-21 DIAGNOSIS — M109 Gout, unspecified: Secondary | ICD-10-CM | POA: Diagnosis not present

## 2017-06-21 DIAGNOSIS — G4733 Obstructive sleep apnea (adult) (pediatric): Secondary | ICD-10-CM | POA: Diagnosis not present

## 2017-06-23 MED ORDER — BETAMETHASONE SOD PHOS & ACET 6 (3-3) MG/ML IJ SUSP: 3.0000 mg | Freq: Once | INTRAMUSCULAR | Status: DC

## 2017-06-23 NOTE — Progress Notes (Signed)
   HPI: 71 year old female presents the office today for follow-up treatment and evaluation regarding DJD to the left midfoot/ankle. Patient continues to have significant amount of pain. Patient had a custom molded AFO brace dispensed however she does not like how it feels. It supports the foot and ankle however it is uncomfortable. She presents today for further treatment and evaluation  Past Medical History:  Diagnosis Date  . Arthritis   . Diabetes mellitus, type 2 ([place])   . Facet joint disease ([place])   . GERD (gastroesophageal reflux disease)   . Hyperlipidemia   . Hypertension   . OSA (obstructive sleep apnea)    does not use CPAP   . Osteopenia   . Shortness of breath    with exertion  . Vitamin D deficiency      Physical Exam: General: The patient is alert and oriented x3 in no acute distress.  Dermatology: Skin is warm, dry and supple bilateral lower extremities. Negative for open lesions or macerations.  Vascular: Palpable pedal pulses bilaterally. No edema or erythema noted. Capillary refill within normal limits.  Neurological: Epicritic and protective threshold grossly intact bilaterally.   Musculoskeletal Exam: Significant pain on palpation noted along the posterior tibial tendon of the left lower extremity as well as to the anterior medial and lateral aspects of the patient's left ankle joint. Range of motion within normal limits to all pedal and ankle joints bilateral. Muscle strength 5/5 in all groups bilateral.   Assessment: -  DJD left midfoot/ankle -Synovitis of left ankle - Posterior tibial tendon dysfunction left lower extremity   Plan of Care:  - Patient was evaluated today. - Continue ankle-foot orthosis. I explained to patient that although this is bulky and is not comfortable it does the best at alleviating her symptoms of pain. The patient is not a surgical candidate. - Injection of 0.5 mL Celestone sites were injected in the left ankle joint. -  Return  to clinic in 4 weeks   Edrick Kins, DPM [facility]  Dr. Edrick Kins, DPM    [address], [place] [address]                Office [phone]  Fax [phone]

## 2017-06-27 DIAGNOSIS — E559 Vitamin D deficiency, unspecified: Secondary | ICD-10-CM | POA: Diagnosis not present

## 2017-06-27 DIAGNOSIS — M109 Gout, unspecified: Secondary | ICD-10-CM | POA: Diagnosis not present

## 2017-06-27 DIAGNOSIS — S82892D Other fracture of left lower leg, subsequent encounter for closed fracture with routine healing: Secondary | ICD-10-CM | POA: Diagnosis not present

## 2017-06-27 DIAGNOSIS — G4733 Obstructive sleep apnea (adult) (pediatric): Secondary | ICD-10-CM | POA: Diagnosis not present

## 2017-06-27 DIAGNOSIS — S2242XD Multiple fractures of ribs, left side, subsequent encounter for fracture with routine healing: Secondary | ICD-10-CM | POA: Diagnosis not present

## 2017-06-27 DIAGNOSIS — I872 Venous insufficiency (chronic) (peripheral): Secondary | ICD-10-CM | POA: Diagnosis not present

## 2017-07-03 DIAGNOSIS — S82892D Other fracture of left lower leg, subsequent encounter for closed fracture with routine healing: Secondary | ICD-10-CM | POA: Diagnosis not present

## 2017-07-03 DIAGNOSIS — S2242XD Multiple fractures of ribs, left side, subsequent encounter for fracture with routine healing: Secondary | ICD-10-CM | POA: Diagnosis not present

## 2017-07-03 DIAGNOSIS — I872 Venous insufficiency (chronic) (peripheral): Secondary | ICD-10-CM | POA: Diagnosis not present

## 2017-07-03 DIAGNOSIS — E559 Vitamin D deficiency, unspecified: Secondary | ICD-10-CM | POA: Diagnosis not present

## 2017-07-03 DIAGNOSIS — M109 Gout, unspecified: Secondary | ICD-10-CM | POA: Diagnosis not present

## 2017-07-03 DIAGNOSIS — G4733 Obstructive sleep apnea (adult) (pediatric): Secondary | ICD-10-CM | POA: Diagnosis not present

## 2017-07-04 DIAGNOSIS — Z6841 Body Mass Index (BMI) 40.0 and over, adult: Secondary | ICD-10-CM | POA: Diagnosis not present

## 2017-07-04 DIAGNOSIS — G4733 Obstructive sleep apnea (adult) (pediatric): Secondary | ICD-10-CM | POA: Diagnosis not present

## 2017-07-04 DIAGNOSIS — G2581 Restless legs syndrome: Secondary | ICD-10-CM | POA: Diagnosis not present

## 2017-07-04 DIAGNOSIS — E1151 Type 2 diabetes mellitus with diabetic peripheral angiopathy without gangrene: Secondary | ICD-10-CM | POA: Diagnosis not present

## 2017-07-04 DIAGNOSIS — R2681 Unsteadiness on feet: Secondary | ICD-10-CM | POA: Diagnosis not present

## 2017-07-04 DIAGNOSIS — I10 Essential (primary) hypertension: Secondary | ICD-10-CM | POA: Diagnosis not present

## 2017-07-04 DIAGNOSIS — M109 Gout, unspecified: Secondary | ICD-10-CM | POA: Diagnosis not present

## 2017-07-04 DIAGNOSIS — E1121 Type 2 diabetes mellitus with diabetic nephropathy: Secondary | ICD-10-CM | POA: Diagnosis not present

## 2017-07-04 DIAGNOSIS — I8290 Acute embolism and thrombosis of unspecified vein: Secondary | ICD-10-CM | POA: Diagnosis not present

## 2017-07-04 DIAGNOSIS — N184 Chronic kidney disease, stage 4 (severe): Secondary | ICD-10-CM | POA: Diagnosis not present

## 2017-07-04 DIAGNOSIS — Z23 Encounter for immunization: Secondary | ICD-10-CM | POA: Diagnosis not present

## 2017-07-04 DIAGNOSIS — I7389 Other specified peripheral vascular diseases: Secondary | ICD-10-CM | POA: Diagnosis not present

## 2017-07-11 DIAGNOSIS — S82892D Other fracture of left lower leg, subsequent encounter for closed fracture with routine healing: Secondary | ICD-10-CM | POA: Diagnosis not present

## 2017-07-11 DIAGNOSIS — G4733 Obstructive sleep apnea (adult) (pediatric): Secondary | ICD-10-CM | POA: Diagnosis not present

## 2017-07-11 DIAGNOSIS — E559 Vitamin D deficiency, unspecified: Secondary | ICD-10-CM | POA: Diagnosis not present

## 2017-07-11 DIAGNOSIS — I872 Venous insufficiency (chronic) (peripheral): Secondary | ICD-10-CM | POA: Diagnosis not present

## 2017-07-11 DIAGNOSIS — S2242XD Multiple fractures of ribs, left side, subsequent encounter for fracture with routine healing: Secondary | ICD-10-CM | POA: Diagnosis not present

## 2017-07-11 DIAGNOSIS — M109 Gout, unspecified: Secondary | ICD-10-CM | POA: Diagnosis not present

## 2017-07-17 ENCOUNTER — Encounter: Payer: Self-pay | Admitting: Podiatry

## 2017-07-17 ENCOUNTER — Ambulatory Visit (INDEPENDENT_AMBULATORY_CARE_PROVIDER_SITE_OTHER): Payer: PPO | Admitting: Podiatry

## 2017-07-17 DIAGNOSIS — M76829 Posterior tibial tendinitis, unspecified leg: Secondary | ICD-10-CM | POA: Diagnosis not present

## 2017-07-17 DIAGNOSIS — M79676 Pain in unspecified toe(s): Secondary | ICD-10-CM | POA: Diagnosis not present

## 2017-07-17 DIAGNOSIS — E0842 Diabetes mellitus due to underlying condition with diabetic polyneuropathy: Secondary | ICD-10-CM | POA: Diagnosis not present

## 2017-07-17 DIAGNOSIS — B351 Tinea unguium: Secondary | ICD-10-CM

## 2017-07-17 MED ORDER — TRAMADOL HCL 50 MG PO TABS: 50.0000 mg | ORAL_TABLET | Freq: Three times a day (TID) | ORAL | 0 refills | Status: DC | PRN

## 2017-07-18 NOTE — Progress Notes (Signed)
   HPI: 71 year old female with PMHx of DM presents the office today for follow-up treatment and evaluation regarding DJD to the left midfoot/ankle. Patient continues to have significant amount of pain and currently rates it at 10/10. She states the ankle brace does not provide any relief. Patient also complains of elongated, thickened nails. Pain while ambulating in shoes. Patient is unable to trim their own nails. She presents today for further treatment and evaluation.   Past Medical History:  Diagnosis Date  . Arthritis   . Diabetes mellitus, type 2 ([place])   . Facet joint disease   . GERD (gastroesophageal reflux disease)   . Hyperlipidemia   . Hypertension   . OSA (obstructive sleep apnea)    does not use CPAP   . Osteopenia   . Shortness of breath    with exertion  . Vitamin D deficiency      Physical Exam: General: The patient is alert and oriented x3 in no acute distress.  Dermatology: Nails are tender, long, thickened and dystrophic with subungual debris, consistent with onychomycosis, 1-5 bilateral. No signs of infection noted. Skin is warm, dry and supple bilateral lower extremities. Negative for open lesions or macerations.  Vascular: Palpable pedal pulses bilaterally. No edema or erythema noted. Capillary refill within normal limits.  Neurological: Epicritic and protective threshold grossly intact bilaterally.   Musculoskeletal Exam: Significant pain on palpation noted along the posterior tibial tendon of the left lower extremity as well as to the anterior medial and lateral aspects of the patient's left ankle joint. Range of motion within normal limits to all pedal and ankle joints bilateral. Muscle strength 5/5 in all groups bilateral.   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 -  Mechanical debridement of nails 1-5 bilaterally performed using a nail nipper. Filed with dremel without incident.  - Prescription for tramadol 50 mg given to patient. - Return to clinic in 6 weeks.   Edrick Kins, DPM [facility]  Dr. Edrick Kins, DPM    [address], [place] [address]                Office [phone]  Fax [phone]

## 2017-08-09 DIAGNOSIS — Z1231 Encounter for screening mammogram for malignant neoplasm of breast: Secondary | ICD-10-CM | POA: Diagnosis not present

## 2017-08-28 ENCOUNTER — Ambulatory Visit (INDEPENDENT_AMBULATORY_CARE_PROVIDER_SITE_OTHER): Payer: PPO | Admitting: Podiatry

## 2017-08-28 ENCOUNTER — Encounter: Payer: Self-pay | Admitting: Podiatry

## 2017-08-28 DIAGNOSIS — E0842 Diabetes mellitus due to underlying condition with diabetic polyneuropathy: Secondary | ICD-10-CM | POA: Diagnosis not present

## 2017-08-28 DIAGNOSIS — M65972 Unspecified synovitis and tenosynovitis, left ankle and foot: Secondary | ICD-10-CM

## 2017-08-28 DIAGNOSIS — B351 Tinea unguium: Secondary | ICD-10-CM

## 2017-08-28 DIAGNOSIS — M659 Synovitis and tenosynovitis, unspecified: Secondary | ICD-10-CM

## 2017-08-28 DIAGNOSIS — M76822 Posterior tibial tendinitis, left leg: Secondary | ICD-10-CM

## 2017-08-28 DIAGNOSIS — M79676 Pain in unspecified toe(s): Secondary | ICD-10-CM | POA: Diagnosis not present

## 2017-09-02 NOTE — Progress Notes (Signed)
   SUBJECTIVE Patient with a history of diabetes mellitus presents to office today for follow up evaluation of left ankle pain. She also reports pruritis to the plantar aspect of the foot. She has been wearing the ankle brace with no significant relief of the pain.  She is also complaining of elongated, thickened nails. Pain while ambulating in shoes. Patient is unable to trim their own nails.    Past Medical History:  Diagnosis Date  . Arthritis   . Diabetes mellitus, type 2 ([place])   . Facet joint disease   . GERD (gastroesophageal reflux disease)   . Hyperlipidemia   . Hypertension   . OSA (obstructive sleep apnea)    does not use CPAP   . Osteopenia   . Shortness of breath    with exertion  . Vitamin D deficiency     OBJECTIVE General Patient is awake, alert, and oriented x 3 and in no acute distress. Derm Skin is dry and supple bilateral. Negative open lesions or macerations. Remaining integument unremarkable. Nails are tender, long, thickened and dystrophic with subungual debris, consistent with onychomycosis, 1-5 bilateral. No signs of infection noted. Vasc  DP and PT pedal pulses palpable bilaterally. Temperature gradient within normal limits.  Neuro Epicritic and protective threshold sensation diminished bilaterally.  Musculoskeletal Exam Pain on palpation noted to the posterior tibial tendon of the left foot as well as the anterior medial and lateral aspects of the left ankle joint. No symptomatic pedal deformities noted bilateral. Muscular strength within normal limits.  ASSESSMENT 1. Diabetes Mellitus w/ peripheral neuropathy 2. Onychomycosis of nail due to dermatophyte bilateral 3. Pain in foot bilateral 4. Left ankle synovitis 5. PT tendinitis left  PLAN OF CARE 1. Patient evaluated today. 2. Instructed to maintain good pedal hygiene and foot care. Stressed importance of controlling blood sugar.  3. Mechanical debridement of nails 1-5 bilaterally performed using a  nail nipper. Filed with dremel without incident.  4. Injection of 0.5 mLs Celestone Soluspan injected into the left ankle joint. 5. Injection of 0.5 mL Celestone Soluspan injected into the posterior tibial tendon sheath. 6. Continue wearing custom braces. 7. Recommended foot lotion for dry skin. 8. Return to clinic when necessary.    Edrick Kins, DPM [facility]  Dr. Edrick Kins, [address]                                        [place], [place] [address]                Office [phone]  Fax [phone]

## 2017-09-14 DIAGNOSIS — E1165 Type 2 diabetes mellitus with hyperglycemia: Secondary | ICD-10-CM | POA: Diagnosis not present

## 2017-09-14 DIAGNOSIS — E7800 Pure hypercholesterolemia, unspecified: Secondary | ICD-10-CM | POA: Diagnosis not present

## 2017-09-14 DIAGNOSIS — R635 Abnormal weight gain: Secondary | ICD-10-CM | POA: Diagnosis not present

## 2017-09-21 DIAGNOSIS — E7800 Pure hypercholesterolemia, unspecified: Secondary | ICD-10-CM | POA: Diagnosis not present

## 2017-09-21 DIAGNOSIS — E1165 Type 2 diabetes mellitus with hyperglycemia: Secondary | ICD-10-CM | POA: Diagnosis not present

## 2017-09-21 DIAGNOSIS — I10 Essential (primary) hypertension: Secondary | ICD-10-CM | POA: Diagnosis not present

## 2017-09-21 DIAGNOSIS — N189 Chronic kidney disease, unspecified: Secondary | ICD-10-CM | POA: Diagnosis not present

## 2017-09-22 DIAGNOSIS — E1140 Type 2 diabetes mellitus with diabetic neuropathy, unspecified: Secondary | ICD-10-CM | POA: Diagnosis not present

## 2017-09-22 DIAGNOSIS — M109 Gout, unspecified: Secondary | ICD-10-CM | POA: Diagnosis not present

## 2017-09-22 DIAGNOSIS — I8290 Acute embolism and thrombosis of unspecified vein: Secondary | ICD-10-CM | POA: Diagnosis not present

## 2017-09-22 DIAGNOSIS — E1129 Type 2 diabetes mellitus with other diabetic kidney complication: Secondary | ICD-10-CM | POA: Diagnosis not present

## 2017-09-22 DIAGNOSIS — R2681 Unsteadiness on feet: Secondary | ICD-10-CM | POA: Diagnosis not present

## 2017-09-22 DIAGNOSIS — Z6841 Body Mass Index (BMI) 40.0 and over, adult: Secondary | ICD-10-CM | POA: Diagnosis not present

## 2017-09-22 DIAGNOSIS — I7389 Other specified peripheral vascular diseases: Secondary | ICD-10-CM | POA: Diagnosis not present

## 2017-09-22 DIAGNOSIS — N183 Chronic kidney disease, stage 3 (moderate): Secondary | ICD-10-CM | POA: Diagnosis not present

## 2017-09-25 ENCOUNTER — Telehealth: Payer: Self-pay | Admitting: Student-PharmD

## 2017-09-27 NOTE — Telephone Encounter (Signed)
Levonne Lapping, Pharmacy Student, called patient to assess medication adherence to COPD medications at request of patient's health insurance plan.

## 2017-09-27 NOTE — Telephone Encounter (Signed)
Jean Mcclain, Pharmacy Student, called patient to assess medication adherence to COPD medications at request of patient's health insurance plan.

## 2017-10-25 DIAGNOSIS — E1129 Type 2 diabetes mellitus with other diabetic kidney complication: Secondary | ICD-10-CM | POA: Diagnosis not present

## 2017-10-25 DIAGNOSIS — M6281 Muscle weakness (generalized): Secondary | ICD-10-CM | POA: Diagnosis not present

## 2017-10-25 DIAGNOSIS — R400 Somnolence: Secondary | ICD-10-CM | POA: Diagnosis not present

## 2017-10-25 DIAGNOSIS — I10 Essential (primary) hypertension: Secondary | ICD-10-CM | POA: Diagnosis not present

## 2017-10-25 DIAGNOSIS — R2681 Unsteadiness on feet: Secondary | ICD-10-CM | POA: Diagnosis not present

## 2017-10-25 DIAGNOSIS — R0683 Snoring: Secondary | ICD-10-CM | POA: Diagnosis not present

## 2017-10-25 DIAGNOSIS — E668 Other obesity: Secondary | ICD-10-CM | POA: Diagnosis not present

## 2017-10-25 DIAGNOSIS — M109 Gout, unspecified: Secondary | ICD-10-CM | POA: Diagnosis not present

## 2017-10-25 DIAGNOSIS — G4733 Obstructive sleep apnea (adult) (pediatric): Secondary | ICD-10-CM | POA: Diagnosis not present

## 2017-10-25 DIAGNOSIS — M545 Low back pain: Secondary | ICD-10-CM | POA: Diagnosis not present

## 2017-10-25 DIAGNOSIS — E1121 Type 2 diabetes mellitus with diabetic nephropathy: Secondary | ICD-10-CM | POA: Diagnosis not present

## 2017-10-25 DIAGNOSIS — R0609 Other forms of dyspnea: Secondary | ICD-10-CM | POA: Diagnosis not present

## 2017-11-03 DIAGNOSIS — G4733 Obstructive sleep apnea (adult) (pediatric): Secondary | ICD-10-CM | POA: Diagnosis not present

## 2017-11-08 DIAGNOSIS — I10 Essential (primary) hypertension: Secondary | ICD-10-CM | POA: Diagnosis not present

## 2017-11-08 DIAGNOSIS — E1122 Type 2 diabetes mellitus with diabetic chronic kidney disease: Secondary | ICD-10-CM | POA: Diagnosis not present

## 2017-11-08 DIAGNOSIS — N183 Chronic kidney disease, stage 3 (moderate): Secondary | ICD-10-CM | POA: Diagnosis not present

## 2017-11-16 DIAGNOSIS — R0609 Other forms of dyspnea: Secondary | ICD-10-CM | POA: Diagnosis not present

## 2017-11-16 DIAGNOSIS — G4733 Obstructive sleep apnea (adult) (pediatric): Secondary | ICD-10-CM | POA: Diagnosis not present

## 2017-12-14 DIAGNOSIS — R0609 Other forms of dyspnea: Secondary | ICD-10-CM | POA: Diagnosis not present

## 2017-12-14 DIAGNOSIS — G4733 Obstructive sleep apnea (adult) (pediatric): Secondary | ICD-10-CM | POA: Diagnosis not present

## 2017-12-22 DIAGNOSIS — E113293 Type 2 diabetes mellitus with mild nonproliferative diabetic retinopathy without macular edema, bilateral: Secondary | ICD-10-CM | POA: Diagnosis not present

## 2017-12-22 DIAGNOSIS — H26491 Other secondary cataract, right eye: Secondary | ICD-10-CM | POA: Diagnosis not present

## 2017-12-22 DIAGNOSIS — H2512 Age-related nuclear cataract, left eye: Secondary | ICD-10-CM | POA: Diagnosis not present

## 2017-12-22 DIAGNOSIS — H25012 Cortical age-related cataract, left eye: Secondary | ICD-10-CM | POA: Diagnosis not present

## 2017-12-29 ENCOUNTER — Ambulatory Visit: Payer: PPO | Attending: Internal Medicine | Admitting: Physical Therapy

## 2017-12-29 DIAGNOSIS — R2681 Unsteadiness on feet: Secondary | ICD-10-CM | POA: Insufficient documentation

## 2017-12-29 DIAGNOSIS — R2689 Other abnormalities of gait and mobility: Secondary | ICD-10-CM | POA: Diagnosis present

## 2017-12-29 DIAGNOSIS — M6281 Muscle weakness (generalized): Secondary | ICD-10-CM | POA: Insufficient documentation

## 2017-12-30 ENCOUNTER — Encounter: Payer: Self-pay | Admitting: Physical Therapy

## 2017-12-30 ENCOUNTER — Other Ambulatory Visit: Payer: Self-pay

## 2017-12-30 NOTE — Therapy (Signed)
[facility] [address] [place], [place], [address] Phone: [phone]   Fax:  [phone]  Physical Therapy Evaluation  Patient Details  Name: Jean Mcclain MRN: [id number] Date of Birth: 1946-05-14 Referring Provider: Dr. Leanna Battles   Encounter Date: 12/29/2017  PT End of Session - 12/30/17 1117    Visit Number  1    Number of Visits  1    Authorization Type  [employer]    Authorization Time Period  12-29-17 - 01-29-18    PT Start Time  0813    PT Stop Time  0925    PT Time Calculation (min)  72 min       Past Medical History:  Diagnosis Date  . Arthritis   . Diabetes mellitus, type 2 ([place])   . Facet joint disease   . GERD (gastroesophageal reflux disease)   . Hyperlipidemia   . Hypertension   . OSA (obstructive sleep apnea)    does not use CPAP   . Osteopenia   . Shortness of breath    with exertion  . Vitamin D deficiency     Past Surgical History:  Procedure Laterality Date  . INSERTION OF MESH N/A 01/08/2013   Procedure: INSERTION OF MESH;  Surgeon: Earnstine Regal, MD;  Location: [facility];  Service: General;  Laterality: N/A;  . IR THORACENTESIS ASP PLEURAL SPACE W/IMG GUIDE  04/06/2017  . JOINT REPLACEMENT     bilateral knee replacements- 1999  . TOTAL KNEE ARTHROPLASTY  1999   Left  . VENTRAL HERNIA REPAIR N/A 01/08/2013   Procedure: LAPAROSCOPIC VENTRAL HERNIA;  Surgeon: Earnstine Regal, MD;  Location: [facility];  Service: General;  Laterality: N/A;    There were no vitals filed for this visit.   Subjective Assessment - 12/30/17 1111    Subjective  Pt presents for power wheelchair evaluation - amb. with RW with difficulty - leaning over and pt is SOB    Patient Stated Goals  obtain power wheelchair    Currently in Pain?  Yes    Pain Score  8     Pain Location  -- generalized pain in multiple joints due to OA    Pain Type  Chronic pain    Pain Onset  More than a month ago    Pain  Frequency  Constant         [facility] PT Assessment - 12/30/17 0001      Assessment   Medical Diagnosis  Diabetic Neuropathy:  Myofascial pain:  Unsteady gait;  Muscle weakness    Referring Provider  Dr. Leanna Battles    Onset Date/Surgical Date  03/10/17      Precautions   Precautions  Fall      Restrictions   Weight Bearing Restrictions  No      Balance Screen   Has the patient fallen in the past 6 months  -- severe fall in June 2018    Has the patient had a decrease in activity level because of a fear of falling?   Yes    Is the patient reluctant to leave their home because of a fear of falling?   Yes              No data recorded  Objective measurements completed on examination: See above findings.  Plan - 12/30/17 1117    Clinical Impression Statement  Pt is a 72 yr old lady with diabetic neuropathy and spinal stenosis and chronic diastolic CHF.  Pt evaluated for power wheelchair with Deberah Pelton, ATP with [employer] present for eval.  Jazzy ES recommended.    PT Frequency  One time visit    PT Treatment/Interventions  Other (comment) wheelchair management    PT Next Visit Plan  N/A - wheelchair eval only    Consulted and Agree with Plan of Care  Patient       Patient will benefit from skilled therapeutic intervention in order to improve the following deficits and impairments:     Visit Diagnosis: Other abnormalities of gait and mobility - Plan: PT plan of care cert/re-cert  Muscle weakness (generalized) - Plan: PT plan of care cert/re-cert  Unsteadiness on feet - Plan: PT plan of care cert/re-cert     Problem List Patient Active Problem List   Diagnosis Date Noted  . Acute respiratory distress   . Acute on chronic diastolic CHF (congestive heart failure) ([place])   . AKI (acute kidney injury) ([place]) 03/30/2017  . Sepsis ([place]) 03/30/2017  . Acute deep vein thrombosis (DVT) of popliteal vein of left lower  extremity ([place]) 03/11/2017  . Renal insufficiency 03/11/2017  . Insulin-requiring or dependent type II diabetes mellitus ([place]) 03/11/2017  . Essential hypertension 03/11/2017  . Impingement syndrome of left ankle 01/31/2017  . Posterior tibial tendinitis, left leg 01/31/2017  . Incarcerated ventral hernia 01/02/2013    Alda Lea, PT 12/30/2017, 11:25 AM  [facility] [address] [place], [place], [address] Phone: [phone]   Fax:  [phone]  Name: Jean Mcclain MRN: [id number] Date of Birth: 15-Feb-1946

## 2018-01-01 DIAGNOSIS — H26491 Other secondary cataract, right eye: Secondary | ICD-10-CM | POA: Diagnosis not present

## 2018-01-02 DIAGNOSIS — M545 Low back pain: Secondary | ICD-10-CM | POA: Diagnosis not present

## 2018-01-02 DIAGNOSIS — E1121 Type 2 diabetes mellitus with diabetic nephropathy: Secondary | ICD-10-CM | POA: Diagnosis not present

## 2018-01-02 DIAGNOSIS — I10 Essential (primary) hypertension: Secondary | ICD-10-CM | POA: Diagnosis not present

## 2018-01-02 DIAGNOSIS — R0609 Other forms of dyspnea: Secondary | ICD-10-CM | POA: Diagnosis not present

## 2018-01-02 DIAGNOSIS — E1140 Type 2 diabetes mellitus with diabetic neuropathy, unspecified: Secondary | ICD-10-CM | POA: Diagnosis not present

## 2018-01-02 DIAGNOSIS — E1129 Type 2 diabetes mellitus with other diabetic kidney complication: Secondary | ICD-10-CM | POA: Diagnosis not present

## 2018-01-02 DIAGNOSIS — I739 Peripheral vascular disease, unspecified: Secondary | ICD-10-CM | POA: Diagnosis not present

## 2018-01-02 DIAGNOSIS — R2681 Unsteadiness on feet: Secondary | ICD-10-CM | POA: Diagnosis not present

## 2018-01-02 DIAGNOSIS — Z1389 Encounter for screening for other disorder: Secondary | ICD-10-CM | POA: Diagnosis not present

## 2018-01-02 DIAGNOSIS — E662 Morbid (severe) obesity with alveolar hypoventilation: Secondary | ICD-10-CM | POA: Diagnosis not present

## 2018-01-12 DIAGNOSIS — R0609 Other forms of dyspnea: Secondary | ICD-10-CM | POA: Diagnosis not present

## 2018-01-12 DIAGNOSIS — E1140 Type 2 diabetes mellitus with diabetic neuropathy, unspecified: Secondary | ICD-10-CM | POA: Diagnosis not present

## 2018-01-12 DIAGNOSIS — R600 Localized edema: Secondary | ICD-10-CM | POA: Diagnosis not present

## 2018-01-12 DIAGNOSIS — Z6841 Body Mass Index (BMI) 40.0 and over, adult: Secondary | ICD-10-CM | POA: Diagnosis not present

## 2018-01-12 DIAGNOSIS — E1151 Type 2 diabetes mellitus with diabetic peripheral angiopathy without gangrene: Secondary | ICD-10-CM | POA: Diagnosis not present

## 2018-01-12 DIAGNOSIS — G8929 Other chronic pain: Secondary | ICD-10-CM | POA: Diagnosis not present

## 2018-01-12 DIAGNOSIS — R2681 Unsteadiness on feet: Secondary | ICD-10-CM | POA: Diagnosis not present

## 2018-01-12 DIAGNOSIS — M797 Fibromyalgia: Secondary | ICD-10-CM | POA: Diagnosis not present

## 2018-01-12 DIAGNOSIS — R400 Somnolence: Secondary | ICD-10-CM | POA: Diagnosis not present

## 2018-01-12 DIAGNOSIS — M545 Low back pain: Secondary | ICD-10-CM | POA: Diagnosis not present

## 2018-01-12 DIAGNOSIS — M6281 Muscle weakness (generalized): Secondary | ICD-10-CM | POA: Diagnosis not present

## 2018-01-12 DIAGNOSIS — E662 Morbid (severe) obesity with alveolar hypoventilation: Secondary | ICD-10-CM | POA: Diagnosis not present

## 2018-01-14 DIAGNOSIS — R0609 Other forms of dyspnea: Secondary | ICD-10-CM | POA: Diagnosis not present

## 2018-01-14 DIAGNOSIS — G4733 Obstructive sleep apnea (adult) (pediatric): Secondary | ICD-10-CM | POA: Diagnosis not present

## 2018-02-01 DIAGNOSIS — J209 Acute bronchitis, unspecified: Secondary | ICD-10-CM | POA: Diagnosis not present

## 2018-02-20 ENCOUNTER — Encounter: Payer: Self-pay | Admitting: Sports Medicine

## 2018-02-20 ENCOUNTER — Encounter

## 2018-02-20 ENCOUNTER — Telehealth: Payer: Self-pay | Admitting: *Deleted

## 2018-02-20 ENCOUNTER — Ambulatory Visit (INDEPENDENT_AMBULATORY_CARE_PROVIDER_SITE_OTHER): Payer: PPO | Admitting: Sports Medicine

## 2018-02-20 DIAGNOSIS — E0842 Diabetes mellitus due to underlying condition with diabetic polyneuropathy: Secondary | ICD-10-CM | POA: Diagnosis not present

## 2018-02-20 DIAGNOSIS — M79676 Pain in unspecified toe(s): Secondary | ICD-10-CM

## 2018-02-20 DIAGNOSIS — M2140 Flat foot [pes planus] (acquired), unspecified foot: Secondary | ICD-10-CM

## 2018-02-20 DIAGNOSIS — B351 Tinea unguium: Secondary | ICD-10-CM

## 2018-02-20 DIAGNOSIS — R2681 Unsteadiness on feet: Secondary | ICD-10-CM

## 2018-02-20 DIAGNOSIS — M25572 Pain in left ankle and joints of left foot: Secondary | ICD-10-CM

## 2018-02-20 DIAGNOSIS — M7752 Other enthesopathy of left foot: Secondary | ICD-10-CM

## 2018-02-20 NOTE — Telephone Encounter (Signed)
-----   Message from Landis Martins, [place] sent at 02/20/2018  3:13 PM EDT ----- Regarding: PT for Left ankle  Capsulitis and pes planus severe with ankle pain and gait instability Eval and Treat -Dr. Chauncey Cruel

## 2018-02-20 NOTE — Telephone Encounter (Signed)
Hand-delivered required form, clinicals and demographics to [facility] - In-office.

## 2018-02-20 NOTE — Progress Notes (Signed)
Patient ID: Jean Mcclain, female   DOB: [birth year], 72 y.o.   MRN: [id number]   Subjective: Jean Mcclain is a 72 y.o. female patient with history of diabetes who presents to office today complaining of long, painful nails  while ambulating in shoes; unable to trim. Patient states that the glucose reading this morning was 116 and last A1c 6.8. Going to PCP for leg wrap. Patient reports continued pain on left ankle that has not improved with shoes, insoles, braces, and steroid injections. Patient denies any new changes in medication or new problems.  Admits to past accident and ICU admission.   Patient Active Problem List   Diagnosis Date Noted  . Acute respiratory distress   . Acute on chronic diastolic CHF (congestive heart failure) ([place])   . AKI (acute kidney injury) ([place]) 03/30/2017  . Sepsis ([place]) 03/30/2017  . Acute deep vein thrombosis (DVT) of popliteal vein of left lower extremity ([place]) 03/11/2017  . Renal insufficiency 03/11/2017  . Insulin-requiring or dependent type II diabetes mellitus ([place]) 03/11/2017  . Essential hypertension 03/11/2017  . Impingement syndrome of left ankle 01/31/2017  . Posterior tibial tendinitis, left leg 01/31/2017  . Incarcerated ventral hernia 01/02/2013   Current Outpatient Medications on File Prior to Visit  Medication Sig Dispense Refill  . allopurinol (ZYLOPRIM) 100 MG tablet Take 100 mg by mouth daily.    Marland Kitchen amLODipine (NORVASC) 10 MG tablet Take 1 tablet (10 mg total) by mouth daily. 30 tablet 0  . apixaban (ELIQUIS) 5 MG TABS tablet Take 2 tablets (10 mg total) by mouth 2 (two) times daily. Then on 03/17/17, take 1 tabet (5 mg) two times daily. 69 tablet 0  . bisacodyl (DULCOLAX) 5 MG EC tablet Take 1 tablet (5 mg total) by mouth daily as needed for moderate constipation. 30 tablet 0  . camphor-menthol (SARNA) lotion Apply topically as needed for itching. 222 mL 0  . cefUROXime (CEFTIN) 500 MG tablet Take 1 tablet (500 mg total) by mouth daily.  5 tablet 0  . colchicine 0.6 MG tablet Take 0.6 mg by mouth daily.    . cyclobenzaprine (FLEXERIL) 10 MG tablet Take 10 mg by mouth 2 (two) times daily.     . ergocalciferol (VITAMIN D2) 50000 UNITS capsule Take 50,000 Units by mouth once a week. Patient takes on Fridays    . febuxostat (ULORIC) 40 MG tablet Take 40 mg by mouth daily.    . furosemide (LASIX) 20 MG tablet Take 1 tablet (20 mg total) by mouth daily. 30 tablet 0  . insulin NPH Human (HUMULIN N,NOVOLIN N) 100 UNIT/ML injection Inject 15-75 Units into the skin See admin instructions. Use 65 units every morning then use 15 units at lunch then use 75 units at dinner    . insulin regular (NOVOLIN R,HUMULIN R) 100 units/mL injection Inject 30 Units into the skin 3 (three) times daily before meals.    . lovastatin (MEVACOR) 40 MG tablet Take 40 mg by mouth daily.     Marland Kitchen omeprazole (PRILOSEC) 20 MG capsule Take 20 mg by mouth daily.    Marland Kitchen oxyCODONE (OXY IR/ROXICODONE) 5 MG immediate release tablet Take 1 tablet (5 mg total) by mouth every 4 (four) hours as needed for moderate pain. 10 tablet 0  . rOPINIRole (REQUIP) 4 MG tablet Take 4 mg by mouth 2 (two) times daily.     . sodium chloride (OCEAN) 0.65 % SOLN nasal spray Place 1 spray into both nostrils as needed for  congestion (nose irritation).  0  . traMADol (ULTRAM) 50 MG tablet Take 1 tablet (50 mg total) by mouth every 8 (eight) hours as needed. 30 tablet 0   Current Facility-Administered Medications on File Prior to Visit  Medication Dose Route Frequency Provider Last Rate Last Dose  . betamethasone acetate-betamethasone sodium phosphate (CELESTONE) injection 3 mg  3 mg Intramuscular Once Edrick Kins, DPM       No Known Allergies  No results found for this or any previous visit (from the past 2160 hour(s)).  Objective: General: Patient is awake, alert, and oriented x 3 and in no acute distress.  Integument: Skin is warm, dry and supple bilateral. Nails are tender, long,  thickened and dystrophic with subungual debris, consistent with onychomycosis, 1-5 bilateral. No signs of infection. No open lesions or preulcerative lesions present bilateral. Unna boot to left clean dry and intact. Remaining integument unremarkable.  Vasculature:  Dorsalis Pedis pulse 1/4 bilateral. Posterior Tibial pulse  0/4 bilateral. +1pitting edema bilateral with unna boot on left.  Capillary fill time <3 sec 1-5 bilateral. No hair growth to the level of the digits. Temperature gradient within normal limits.   Neurology: The patient has intact sensation measured with a 5.07/10g Semmes Weinstein Monofilament at all pedal sites bilateral . Vibratory sensation diminished bilateral with tuning fork. No Babinski sign present bilateral.   Musculoskeletal: L>R severe pes planus deformity with mild hammertoes noted bilateral. + diffuse left ankle pain. Muscular strength 4/5 in all lower extremity muscular groups. No tenderness with calf compression bilateral.  Assessment and Plan: Problem List Items Addressed This Visit    None    Visit Diagnoses    Pain due to onychomycosis of toenail    -  Primary   Diabetes mellitus due to underlying condition with diabetic polyneuropathy, unspecified whether long term insulin use (H[facility])       Arthralgia of left ankle       Gait instability          -Examined patient. -Discussed and educated patient on diabetic foot care, especially with  regards to the vascular, neurological and musculoskeletal systems.  -Stressed the importance of good glycemic control and the detriment of not  controlling glucose levels in relation to the foot. -Mechanically debrided all nails 1-5 bilateral using sterile nail nipper and filed with dremel without incident  -Continue with PCP follow up for edema management on left -Rx PT for left ankle pain and gait instability  -[employer] diabetic shoe order form was completed; office to contact primary care for approval /  certification;  Office to arrange shoe fitting and dispensing. -Patient to return in 3 months for at risk foot care -Patient advised to call the office if any problems or questions arise in the meantime.  Landis Martins, DPM

## 2018-03-09 ENCOUNTER — Telehealth: Payer: Self-pay | Admitting: Sports Medicine

## 2018-03-09 NOTE — Telephone Encounter (Signed)
lvm for pt to call to schedule an appt for diabetic shoe measurements

## 2018-03-13 ENCOUNTER — Ambulatory Visit: Payer: PPO | Admitting: Sports Medicine

## 2018-03-22 DIAGNOSIS — E7800 Pure hypercholesterolemia, unspecified: Secondary | ICD-10-CM | POA: Diagnosis not present

## 2018-03-22 DIAGNOSIS — L0390 Cellulitis, unspecified: Secondary | ICD-10-CM | POA: Diagnosis not present

## 2018-03-22 DIAGNOSIS — I10 Essential (primary) hypertension: Secondary | ICD-10-CM | POA: Diagnosis not present

## 2018-03-22 DIAGNOSIS — E1165 Type 2 diabetes mellitus with hyperglycemia: Secondary | ICD-10-CM | POA: Diagnosis not present

## 2018-03-22 DIAGNOSIS — N189 Chronic kidney disease, unspecified: Secondary | ICD-10-CM | POA: Diagnosis not present

## 2018-04-30 ENCOUNTER — Other Ambulatory Visit: Payer: Self-pay | Admitting: *Deleted

## 2018-04-30 NOTE — Patient Outreach (Signed)
[facility]) Care Management  04/30/2018  Jean Mcclain 1946/06/14 [id number]  Referral via Nurse Call Center; Request for health information-Caller has red rash on lower stomach. Nurse advise given per guidelines for care of skin. Advised to see PCP within 2 weeks.  Telephone call #1 to patient; left HIPPA compliant voicemail requesting call back.  Plan: Send outreach letter to patient. Follow up in 2-4 business days.  Sherrin Daisy, RN BSN [place] Management Coordinator [facility] Care Management  [phone]

## 2018-05-01 ENCOUNTER — Encounter: Payer: Self-pay | Admitting: *Deleted

## 2018-05-01 DIAGNOSIS — R2681 Unsteadiness on feet: Secondary | ICD-10-CM | POA: Diagnosis not present

## 2018-05-01 DIAGNOSIS — M6281 Muscle weakness (generalized): Secondary | ICD-10-CM | POA: Diagnosis not present

## 2018-05-01 DIAGNOSIS — E1121 Type 2 diabetes mellitus with diabetic nephropathy: Secondary | ICD-10-CM | POA: Diagnosis not present

## 2018-05-07 ENCOUNTER — Other Ambulatory Visit: Payer: Self-pay | Admitting: *Deleted

## 2018-05-07 NOTE — Patient Outreach (Signed)
[facility]) Care Management  05/07/2018  Jean Mcclain 16-Nov-1945 [id number]   Referral via Nurse Call Center; Request for health information-Caller has red rash on lower stomach. Nurse advise given per guidelines for care of skin. Advised to see PCP within 2 weeks.  Telephone call #2; Patient advised of reason for call. HIPPA verification received from patient.   Patient voices that she has not made appointment to see primary care provider because rash is much better. States she is keeping skin folds dry and there is no irritation or redness now. States she last saw primary care provider in May 2019 and has appointment coming up in next several months.  States she will call to see when next appointment is scheduled. Patient was advised to call to make appointment if rash reappears. Patient voices understanding and states she will make appointment sooner is rash reappears before routine appointment.  States she is diabetic and will see her endocrinologist in December. States she takes herself to MD appointments. States last HgbA1c level was 6.3. Voices that she checks blood sugar three times daily. States gives herself insulin injections and takes all of medications as prescribed by her MD. States she participated in outpatient diabetes classes about 10 years ago. Patient was not sure what levels blood sugar should be throughout the day but tries to keep it below 100.  States she knows to eat or drink something sweet if her blood sugar is too low.  Patient advised of  [facility] services. Patient states she needs refresher on  caring for her diabetes and consents to referral to Health Coach.  Plan: Refer to [place] for disease management for DM. Telephonic signing off.   Sherrin Daisy, RN BSN [place] Management Coordinator [facility] Care Management  [phone]

## 2018-05-21 ENCOUNTER — Other Ambulatory Visit: Payer: Self-pay

## 2018-05-21 NOTE — Patient Outreach (Signed)
[facility]) Care Management  05/21/2018  Jean Mcclain Feb 15, 1946 [id number]    1st outreach attempt to the patient for initial assessment. No answer.  HIPAA compliant voicemail left with contact information.  Plan: RN Health Coach will send letter. RN Health Coach will make another attempt to the patient within four business days.  Lazaro Arms RN, BSN, [place] Direct Dial:  [phone]  Fax: [phone]

## 2018-05-22 ENCOUNTER — Encounter: Payer: Self-pay | Admitting: Sports Medicine

## 2018-05-22 ENCOUNTER — Ambulatory Visit: Payer: PPO | Admitting: Sports Medicine

## 2018-05-22 ENCOUNTER — Other Ambulatory Visit: Payer: Self-pay

## 2018-05-22 DIAGNOSIS — B351 Tinea unguium: Secondary | ICD-10-CM | POA: Diagnosis not present

## 2018-05-22 DIAGNOSIS — M79676 Pain in unspecified toe(s): Secondary | ICD-10-CM

## 2018-05-22 DIAGNOSIS — E0842 Diabetes mellitus due to underlying condition with diabetic polyneuropathy: Secondary | ICD-10-CM | POA: Diagnosis not present

## 2018-05-22 NOTE — Progress Notes (Signed)
Patient ID: CALLE SCHADER, female   DOB: December 10, 1945, 72 y.o.   MRN: [id number]   Subjective: Jean Mcclain is a 72 y.o. female patient with history of diabetes who presents to office today complaining of long, painful nails  while ambulating in shoes; unable to trim. Patient states that the glucose reading this morning was 120 and last A1c 6.8. Going to PCP for leg wrap. Patient reports that her PCP had to give her antibiotics for infection at left toenail. Patient denies any new changes in medication or new problems.  Patient Active Problem List   Diagnosis Date Noted  . Acute respiratory distress   . Acute on chronic diastolic CHF (congestive heart failure) ([place])   . AKI (acute kidney injury) ([place]) 03/30/2017  . Sepsis ([place]) 03/30/2017  . Acute deep vein thrombosis (DVT) of popliteal vein of left lower extremity ([place]) 03/11/2017  . Renal insufficiency 03/11/2017  . Insulin-requiring or dependent type II diabetes mellitus ([place]) 03/11/2017  . Essential hypertension 03/11/2017  . Impingement syndrome of left ankle 01/31/2017  . Posterior tibial tendinitis, left leg 01/31/2017  . Incarcerated ventral hernia 01/02/2013   Current Outpatient Medications on File Prior to Visit  Medication Sig Dispense Refill  . allopurinol (ZYLOPRIM) 100 MG tablet Take 100 mg by mouth daily.    Marland Kitchen amLODipine (NORVASC) 10 MG tablet Take 1 tablet (10 mg total) by mouth daily. 30 tablet 0  . apixaban (ELIQUIS) 5 MG TABS tablet Take 2 tablets (10 mg total) by mouth 2 (two) times daily. Then on 03/17/17, take 1 tabet (5 mg) two times daily. 69 tablet 0  . bisacodyl (DULCOLAX) 5 MG EC tablet Take 1 tablet (5 mg total) by mouth daily as needed for moderate constipation. 30 tablet 0  . camphor-menthol (SARNA) lotion Apply topically as needed for itching. 222 mL 0  . cefUROXime (CEFTIN) 500 MG tablet Take 1 tablet (500 mg total) by mouth daily. 5 tablet 0  . colchicine 0.6 MG tablet Take 0.6 mg by mouth daily.    .  cyclobenzaprine (FLEXERIL) 10 MG tablet Take 10 mg by mouth 2 (two) times daily.     . ergocalciferol (VITAMIN D2) 50000 UNITS capsule Take 50,000 Units by mouth once a week. Patient takes on Fridays    . febuxostat (ULORIC) 40 MG tablet Take 40 mg by mouth daily.    . furosemide (LASIX) 20 MG tablet Take 1 tablet (20 mg total) by mouth daily. 30 tablet 0  . insulin NPH Human (HUMULIN N,NOVOLIN N) 100 UNIT/ML injection Inject 15-75 Units into the skin See admin instructions. Use 65 units every morning then use 15 units at lunch then use 75 units at dinner    . insulin regular (NOVOLIN R,HUMULIN R) 100 units/mL injection Inject 30 Units into the skin 3 (three) times daily before meals.    . lovastatin (MEVACOR) 40 MG tablet Take 40 mg by mouth daily.     Marland Kitchen omeprazole (PRILOSEC) 20 MG capsule Take 20 mg by mouth daily.    Marland Kitchen oxyCODONE (OXY IR/ROXICODONE) 5 MG immediate release tablet Take 1 tablet (5 mg total) by mouth every 4 (four) hours as needed for moderate pain. 10 tablet 0  . rOPINIRole (REQUIP) 4 MG tablet Take 4 mg by mouth 2 (two) times daily.     . sodium chloride (OCEAN) 0.65 % SOLN nasal spray Place 1 spray into both nostrils as needed for congestion (nose irritation).  0  . traMADol (ULTRAM) 50 MG tablet  Take 1 tablet (50 mg total) by mouth every 8 (eight) hours as needed. 30 tablet 0   Current Facility-Administered Medications on File Prior to Visit  Medication Dose Route Frequency Provider Last Rate Last Dose  . betamethasone acetate-betamethasone sodium phosphate (CELESTONE) injection 3 mg  3 mg Intramuscular Once Edrick Kins, DPM       No Known Allergies  No results found for this or any previous visit (from the past 2160 hour(s)).  Objective: General: Patient is awake, alert, and oriented x 3 and in no acute distress.  Integument: Skin is warm, dry and supple bilateral. Nails are tender, long, thickened and dystrophic with subungual debris, consistent with onychomycosis,  1-5 bilateral. No signs of infection. No open lesions or preulcerative lesions present bilateral. Unna boot to left clean dry and intact. Remaining integument unremarkable.  Vasculature:  Dorsalis Pedis pulse 1/4 bilateral. Posterior Tibial pulse  0/4 bilateral. +1pitting edema bilateral with unna boot on left.  Capillary fill time <3 sec 1-5 bilateral. No hair growth to the level of the digits. Temperature gradient within normal limits.   Neurology: The patient has intact sensation measured with a 5.07/10g Semmes Weinstein Monofilament at all pedal sites bilateral . Vibratory sensation diminished bilateral with tuning fork. No Babinski sign present bilateral.   Musculoskeletal: L>R severe pes planus deformity with mild hammertoes noted bilateral. Muscular strength 4/5 in all lower extremity muscular groups. No tenderness with calf compression bilateral.  Assessment and Plan: Problem List Items Addressed This Visit    None    Visit Diagnoses    Pain due to onychomycosis of toenail    -  Primary   Diabetes mellitus due to underlying condition with diabetic polyneuropathy, unspecified whether long term insulin use ([place])          -Examined patient. -Discussed and educated patient on diabetic foot care, especially with  regards to the vascular, neurological and musculoskeletal systems.  -Stressed the importance of good glycemic control and the detriment of not  controlling glucose levels in relation to the foot. -Mechanically debrided all nails 1-5 bilateral using sterile nail nipper and filed with dremel without incident  -Continue with PCP follow up for edema management on left -Patient to return in 3 months for at risk foot care -Patient advised to call the office if any problems or questions arise in the meantime.  Landis Martins, DPM

## 2018-05-22 NOTE — Patient Outreach (Signed)
[facility]) Care Management  05/22/2018  BRUNETTA NEWINGHAM September 25, 1946 [id number]    2nd attempt to the patient for initial assessment.  No answer. HIPAA compliant voicemail left with contact information  Plan: [facility] will make another outreach attempt to the patient within the next four business days.   Lazaro Arms RN, BSN, [place] Direct Dial:  [phone]  Fax: [phone]

## 2018-05-28 ENCOUNTER — Other Ambulatory Visit: Payer: Self-pay

## 2018-05-28 NOTE — Patient Outreach (Signed)
[facility]) Care Management  05/28/2018   Jean Mcclain June 18, 1946 [id number]      Outreach attempt # 1 Patient able to verify HIPAA.  Explained to the patient health coach role and [facility] services.  Patient is open to the call and verbally agreed to services.     Social: The patient lives in the home alone.  She states that she is able to perform her ADLS/IADLS but it is  becoming hard for her.  I advised the patient to talk with her physician to see about getting an order for an aid.  The patient verbalized understanding. She states that she has a hard time walking in the home and down her steps to outside. She has an IT trainer wheelchair that she uses in the home and a walker out side.  She states that she has been in touch with someone who will be able to build her a ramp out side.  She is able to drive herself to her appointments. The durable medical equipment in the home include walker, wheelchair, shower bench, and  CBG meter.  Conditions: Per chart review and speaking with the patient her PMH includes DM type II, HTN, Kidney Diease, CHF, GERD and Hyperlipidemia. The patient states that she has not had a fall in the last year. She does have chronic pain in her right hip and left ankle that she is not able to put weight on.  She rates the pain at a 10/10 weight bearing. The patient states that her blood sugar this morning was 116.  The patient states that she takes her medications as prescribed.  She does states that she does not follow a diabetic diet.  She states that portion control is hard for her.  We discussed ideas for portion control and I am sending the patient EMMI information on diabetic diet exchange and meal planning.  Medications:The patient is on twelve medications.  She states that she can managed them and can pay  For them.  Appointments: The patient has an eye appointment with Dr. Kathlen Mody on 9/26.  Dr Chalmers Cater her endocrinologist in September and Dr Cannon Kettle her  podiatrist on 11/12.  Advanced Directives: The patient does not have an advanced directive.  She declined any information.    Current Medications:  Current Outpatient Medications  Medication Sig Dispense Refill  . apixaban (ELIQUIS) 5 MG TABS tablet Take 2 tablets (10 mg total) by mouth 2 (two) times daily. Then on 03/17/17, take 1 tabet (5 mg) two times daily. 69 tablet 0  . colchicine 0.6 MG tablet Take 0.6 mg by mouth daily.    . cyclobenzaprine (FLEXERIL) 10 MG tablet Take 10 mg by mouth 2 (two) times daily.     . ergocalciferol (VITAMIN D2) 50000 UNITS capsule Take 50,000 Units by mouth once a week. Patient takes on Fridays    . febuxostat (ULORIC) 40 MG tablet Take 40 mg by mouth daily.    . furosemide (LASIX) 20 MG tablet Take 1 tablet (20 mg total) by mouth daily. 30 tablet 0  . insulin NPH Human (HUMULIN N,NOVOLIN N) 100 UNIT/ML injection Inject 15-75 Units into the skin See admin instructions. Use 65 units every morning then use 15 units at lunch then use 75 units at dinner    . insulin regular (NOVOLIN R,HUMULIN R) 100 units/mL injection Inject 30 Units into the skin 3 (three) times daily before meals.    . lovastatin (MEVACOR) 40 MG tablet Take 40 mg by mouth  daily.     . omeprazole (PRILOSEC) 20 MG capsule Take 20 mg by mouth daily.    Marland Kitchen rOPINIRole (REQUIP) 4 MG tablet Take 4 mg by mouth 2 (two) times daily.     . sodium chloride (OCEAN) 0.65 % SOLN nasal spray Place 1 spray into both nostrils as needed for congestion (nose irritation).  0  . traMADol (ULTRAM) 50 MG tablet Take 1 tablet (50 mg total) by mouth every 8 (eight) hours as needed. 30 tablet 0  . allopurinol (ZYLOPRIM) 100 MG tablet Take 100 mg by mouth daily.    Marland Kitchen amLODipine (NORVASC) 10 MG tablet Take 1 tablet (10 mg total) by mouth daily. (Patient not taking: Reported on 05/28/2018) 30 tablet 0  . bisacodyl (DULCOLAX) 5 MG EC tablet Take 1 tablet (5 mg total) by mouth daily as needed for moderate constipation. (Patient  not taking: Reported on 05/28/2018) 30 tablet 0  . camphor-menthol (SARNA) lotion Apply topically as needed for itching. (Patient not taking: Reported on 05/28/2018) 222 mL 0  . cefUROXime (CEFTIN) 500 MG tablet Take 1 tablet (500 mg total) by mouth daily. (Patient not taking: Reported on 05/28/2018) 5 tablet 0  . gabapentin (NEURONTIN) 300 MG capsule Take 300 mg by mouth 3 (three) times daily.    Marland Kitchen oxyCODONE (OXY IR/ROXICODONE) 5 MG immediate release tablet Take 1 tablet (5 mg total) by mouth every 4 (four) hours as needed for moderate pain. (Patient not taking: Reported on 05/28/2018) 10 tablet 0   Current Facility-Administered Medications  Medication Dose Route Frequency Provider Last Rate Last Dose  . betamethasone acetate-betamethasone sodium phosphate (CELESTONE) injection 3 mg  3 mg Intramuscular Once Edrick Kins, DPM        Functional Status:  In your present state of health, do you have any difficulty performing the following activities: 05/28/2018  Hearing? N  Vision? N  Difficulty concentrating or making decisions? N  Walking or climbing stairs? Y  Dressing or bathing? Y  Doing errands, shopping? N  Some recent data might be hidden    Fall/Depression Screening: Fall Risk  05/28/2018 05/21/2014  Falls in the past year? No No   PHQ 2/9 Scores 05/28/2018 05/21/2014  PHQ - 2 Score 1 1    Assessment: Patient will benefit from health coach outreach for disease management and support.   THN CM Care Plan Problem One     Most Recent Value  Care Plan Problem One  knowledge deficit related to disiease  Role Documenting the Problem One  Health Coach  Care Plan for Problem One  Active  THN Long Term Goal   In 90 days the patient will maintain an a1c of 6.3  THN Long Term Goal Start Date  05/28/18  Interventions for Problem One Long Term Goal  discussed cbg values, diet and medication maintainance       Plan: [place] will provide ongoing education for patient on diabetes  through phone calls and sending printed information to patient for further discussion.  RN Health Coach will send welcome packet with consent to patient as well as printed information on diabetes  RN Health Coach will send initial barriers letter, assessment, and care plan to primary care physician. RN Health Coach will contact patient in the month of September and patient agrees to next outreach.   Lazaro Arms RN, BSN, [place] Direct Dial:  [phone]  Fax: [phone]

## 2018-06-04 DIAGNOSIS — N183 Chronic kidney disease, stage 3 (moderate): Secondary | ICD-10-CM | POA: Diagnosis not present

## 2018-06-13 DIAGNOSIS — Z23 Encounter for immunization: Secondary | ICD-10-CM | POA: Diagnosis not present

## 2018-06-14 DIAGNOSIS — E1122 Type 2 diabetes mellitus with diabetic chronic kidney disease: Secondary | ICD-10-CM | POA: Diagnosis not present

## 2018-06-14 DIAGNOSIS — N2581 Secondary hyperparathyroidism of renal origin: Secondary | ICD-10-CM | POA: Diagnosis not present

## 2018-06-14 DIAGNOSIS — I129 Hypertensive chronic kidney disease with stage 1 through stage 4 chronic kidney disease, or unspecified chronic kidney disease: Secondary | ICD-10-CM | POA: Diagnosis not present

## 2018-06-14 DIAGNOSIS — N183 Chronic kidney disease, stage 3 (moderate): Secondary | ICD-10-CM | POA: Diagnosis not present

## 2018-06-21 DIAGNOSIS — N183 Chronic kidney disease, stage 3 (moderate): Secondary | ICD-10-CM | POA: Diagnosis not present

## 2018-06-28 ENCOUNTER — Other Ambulatory Visit: Payer: Self-pay

## 2018-06-28 NOTE — Patient Outreach (Signed)
[facility]) Care Management  06/28/2018   Jean Mcclain January 18, 1946 [id number]  Subjective: Telephone call to the patient for assessment. HIPAA verified. The patient states that she is doing fair.  She did receive educational material mailed to her. The patient states that she checks her blood sugars daily but does not write her results down. She could not give me a result for today or yesterday but states that is was normal. I encouraged the patient to write her results to be able to observe trends and documentation. Patient verbalized understanding.  The patient denies any falls.   She does state that she has chronic pain when she walks.  She does not take any of her medications prescribed  for the pain. She states that due to the pain it causes her to eat more than usual.  I advised the patient to call and leave a message at her physician office to see if she could get a referral to a pain clinic. She verbalized understanding.  The patient states that she had her Flu shot two weeks ago.  She could not remember the exact date.  She has an appointment at her ophthalmologist Dr Kathlen Mody on 9/26, her nephrologist Dr Joelyn Oms on 10/2,  Her podiatrist  Dr. Cannon Kettle on 11/12 and Dr Chalmers Cater in December.  Current Medications:  Current Outpatient Medications  Medication Sig Dispense Refill  . allopurinol (ZYLOPRIM) 100 MG tablet Take 100 mg by mouth daily.    Marland Kitchen apixaban (ELIQUIS) 5 MG TABS tablet Take 2 tablets (10 mg total) by mouth 2 (two) times daily. Then on 03/17/17, take 1 tabet (5 mg) two times daily. 69 tablet 0  . cyclobenzaprine (FLEXERIL) 10 MG tablet Take 10 mg by mouth 2 (two) times daily.     . ergocalciferol (VITAMIN D2) 50000 UNITS capsule Take 50,000 Units by mouth once a week. Patient takes on Fridays    . febuxostat (ULORIC) 40 MG tablet Take 40 mg by mouth daily.    . furosemide (LASIX) 20 MG tablet Take 1 tablet (20 mg total) by mouth daily. 30 tablet 0  . insulin NPH Human  (HUMULIN N,NOVOLIN N) 100 UNIT/ML injection Inject 15-75 Units into the skin See admin instructions. Use 65 units every morning then use 15 units at lunch then use 75 units at dinner    . insulin regular (NOVOLIN R,HUMULIN R) 100 units/mL injection Inject 30 Units into the skin 3 (three) times daily before meals.    . lovastatin (MEVACOR) 40 MG tablet Take 40 mg by mouth daily.     Marland Kitchen omeprazole (PRILOSEC) 20 MG capsule Take 20 mg by mouth daily.    Marland Kitchen rOPINIRole (REQUIP) 4 MG tablet Take 4 mg by mouth 2 (two) times daily.     . sodium chloride (OCEAN) 0.65 % SOLN nasal spray Place 1 spray into both nostrils as needed for congestion (nose irritation).  0  . amLODipine (NORVASC) 10 MG tablet Take 1 tablet (10 mg total) by mouth daily. (Patient not taking: Reported on 05/28/2018) 30 tablet 0  . bisacodyl (DULCOLAX) 5 MG EC tablet Take 1 tablet (5 mg total) by mouth daily as needed for moderate constipation. (Patient not taking: Reported on 05/28/2018) 30 tablet 0  . camphor-menthol (SARNA) lotion Apply topically as needed for itching. (Patient not taking: Reported on 05/28/2018) 222 mL 0  . cefUROXime (CEFTIN) 500 MG tablet Take 1 tablet (500 mg total) by mouth daily. (Patient not taking: Reported on 05/28/2018) 5 tablet  0  . colchicine 0.6 MG tablet Take 0.6 mg by mouth daily.    Marland Kitchen gabapentin (NEURONTIN) 300 MG capsule Take 300 mg by mouth 3 (three) times daily.    Marland Kitchen oxyCODONE (OXY IR/ROXICODONE) 5 MG immediate release tablet Take 1 tablet (5 mg total) by mouth every 4 (four) hours as needed for moderate pain. (Patient not taking: Reported on 05/28/2018) 10 tablet 0  . traMADol (ULTRAM) 50 MG tablet Take 1 tablet (50 mg total) by mouth every 8 (eight) hours as needed. (Patient not taking: Reported on 06/28/2018) 30 tablet 0   Current Facility-Administered Medications  Medication Dose Route Frequency Provider Last Rate Last Dose  . betamethasone acetate-betamethasone sodium phosphate (CELESTONE) injection 3  mg  3 mg Intramuscular Once Edrick Kins, DPM        Functional Status:  In your present state of health, do you have any difficulty performing the following activities: 05/28/2018  Hearing? N  Vision? N  Difficulty concentrating or making decisions? N  Walking or climbing stairs? Y  Dressing or bathing? Y  Doing errands, shopping? N  Some recent data might be hidden    Fall/Depression Screening: Fall Risk  06/28/2018 06/28/2018 05/28/2018  Falls in the past year? No No No   PHQ 2/9 Scores 05/28/2018 05/21/2014  PHQ - 2 Score 1 1    Assessment: Patient will continue to benefit from health coach outreach for disease management and support.  THN CM Care Plan Problem One     Most Recent Value  THN Long Term Goal   In 90 days the patient will maintain an a1c of 6.3  THN Long Term Goal Start Date  06/28/18  Interventions for Problem One Long Term Goal  discussed medication and diet     Plan: RN Health Coach will contact patient in the month of December and patient agrees to next outreach.  Lazaro Arms RN, BSN, [place] Direct Dial:  [phone]  Fax: [phone]

## 2018-07-02 IMAGING — CR DG HIP (WITH OR WITHOUT PELVIS) 2-3V*L*
3 series · 3 of 3 positions shown · non-contrast
Comparison: 03/31/2017 as well as CT 12/12/2012

CLINICAL DATA: Fall down 2 stairs with left hip pain.

EXAM:
DG HIP (WITH OR WITHOUT PELVIS) 2-3V LEFT

[hip ap]
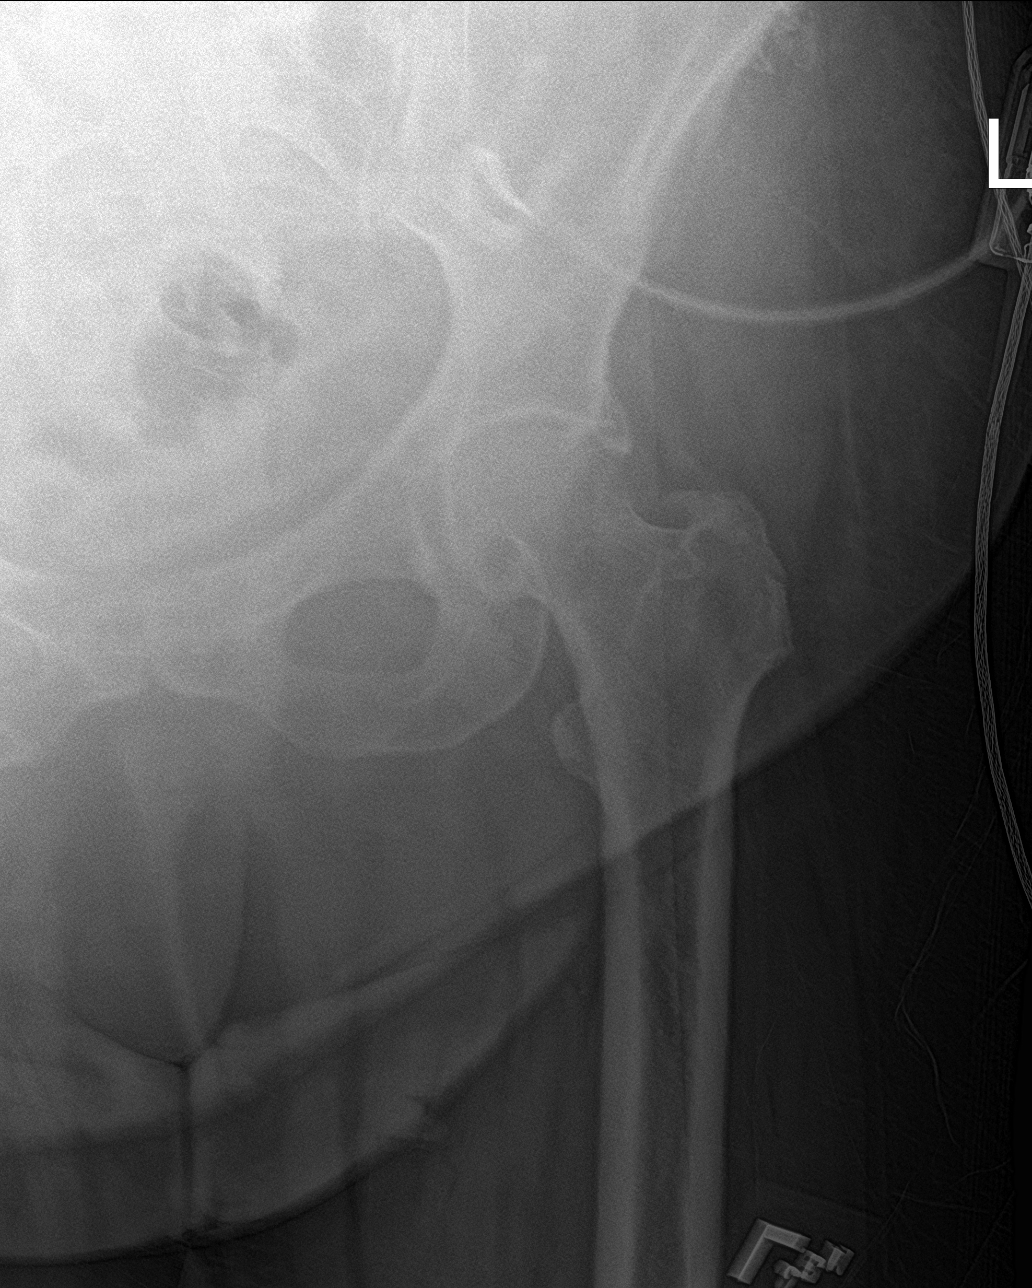

[hip lat]
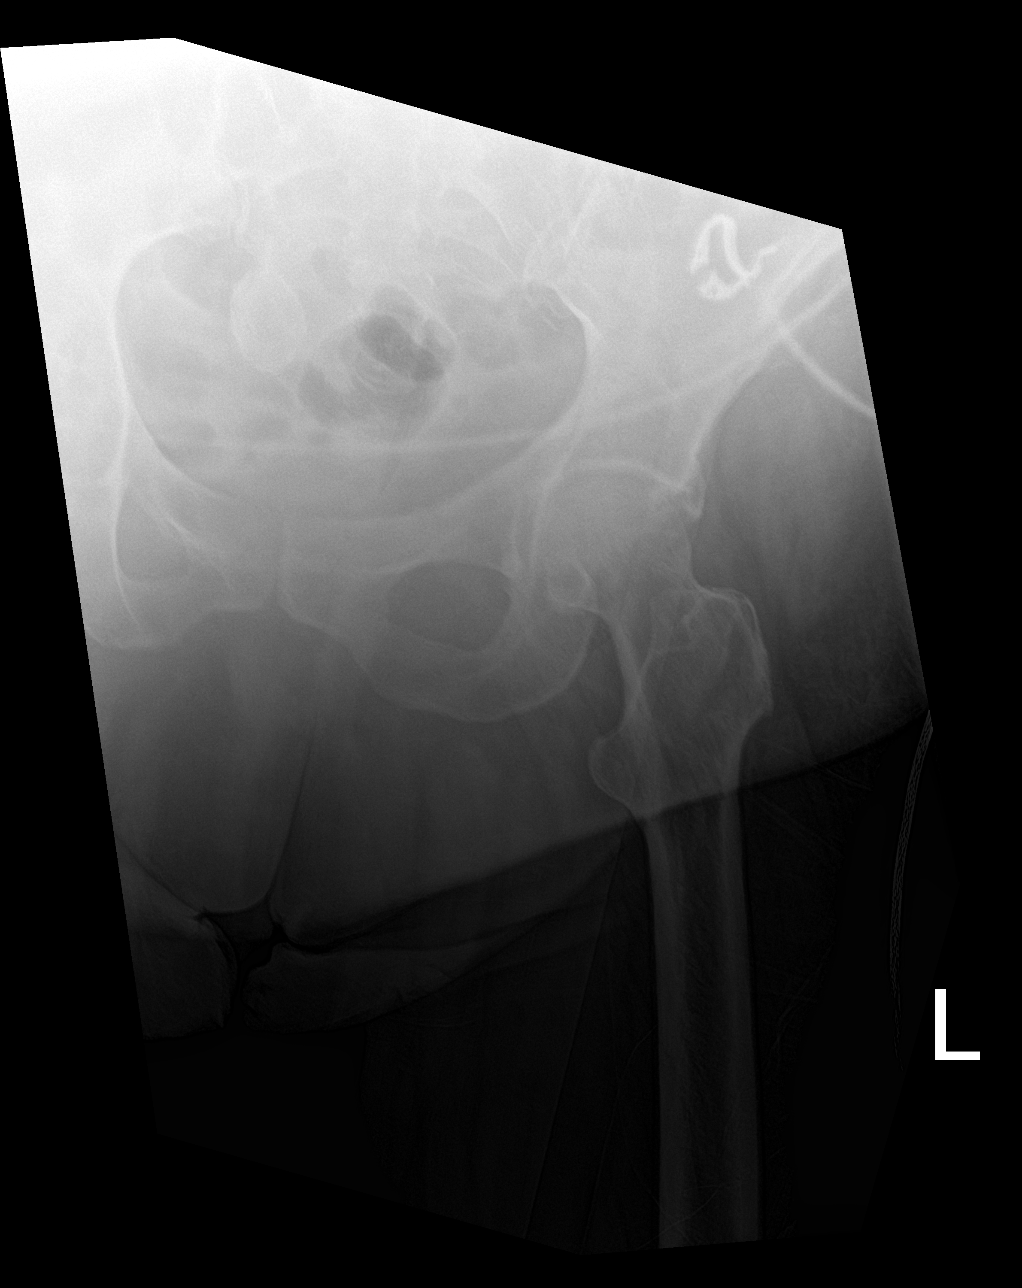

[pelvis ap]
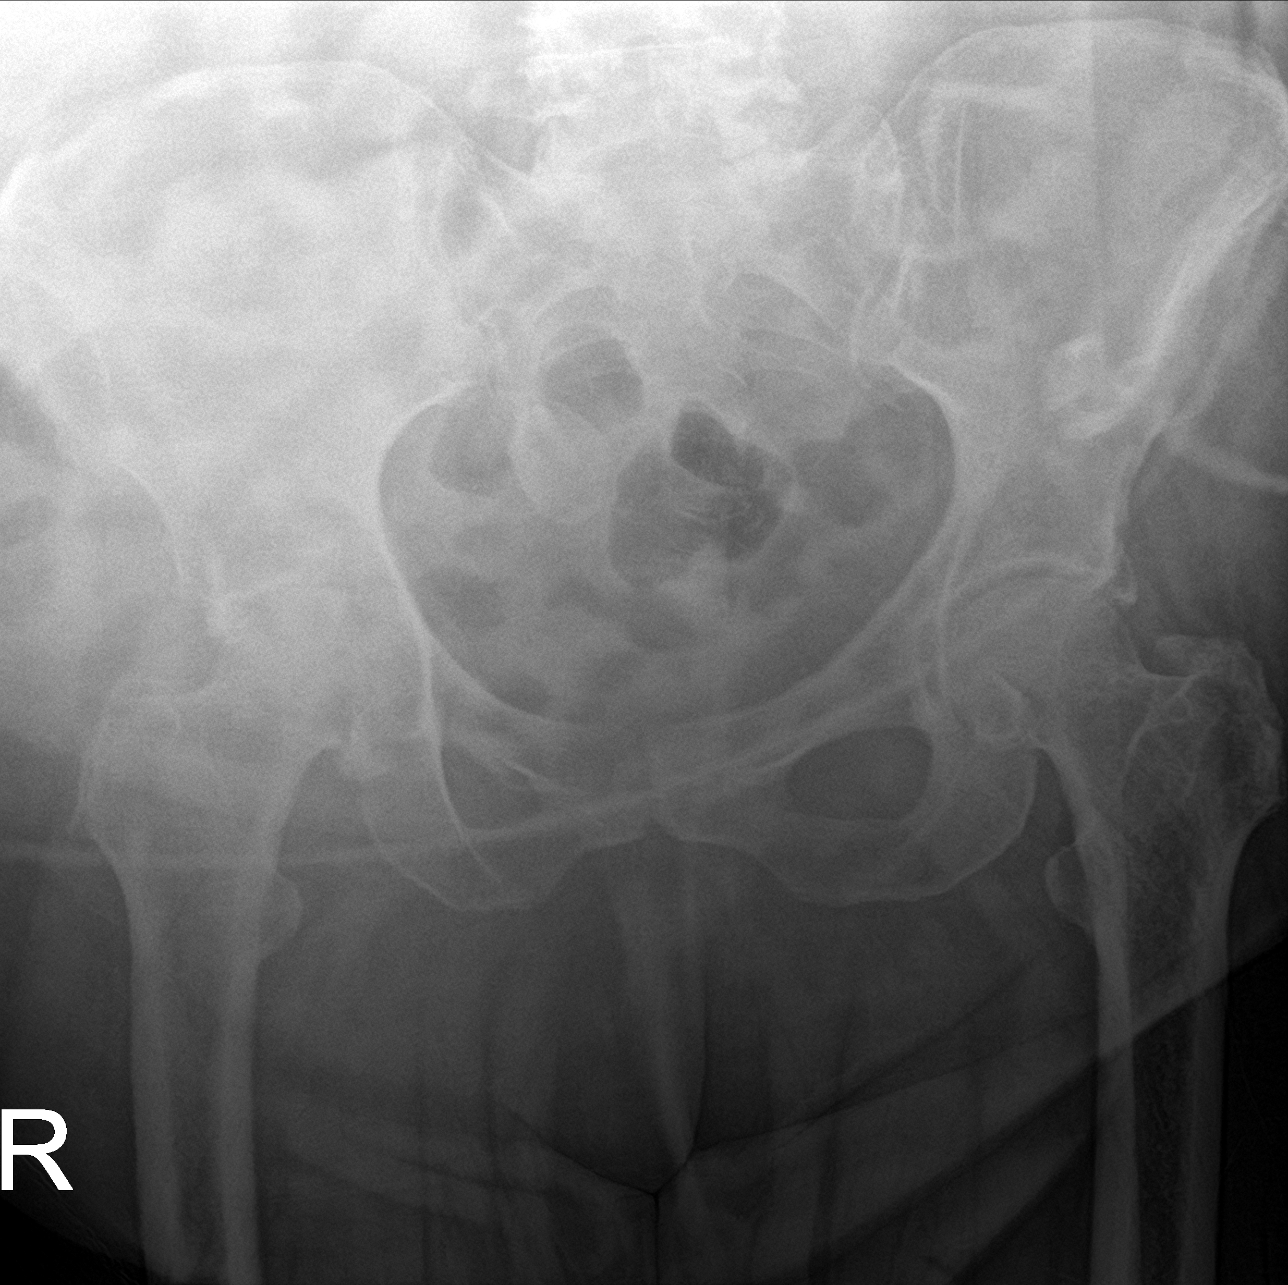

[3 of 3 positions shown; findings below may reference images not displayed]

FINDINGS: There is diffuse decreased bone mineralization. Moderate
osteoarthritic change of the right hip and mild degenerative change
of the left hip. No acute fracture or dislocation. Degenerative
change of the spine. Oval rim calcified right adnexal structure
unchanged.
IMPRESSION: No acute findings per

Osteoarthritic change of the hips right worse than left.

## 2018-07-02 IMAGING — CR DG RIBS 2V*L*
2 series · 2 of 2 positions shown · non-contrast
Comparison: 03/31/2017

CLINICAL DATA: Recent fall, left chest pain

EXAM:
LEFT RIBS - 2 VIEW

[rib pa]
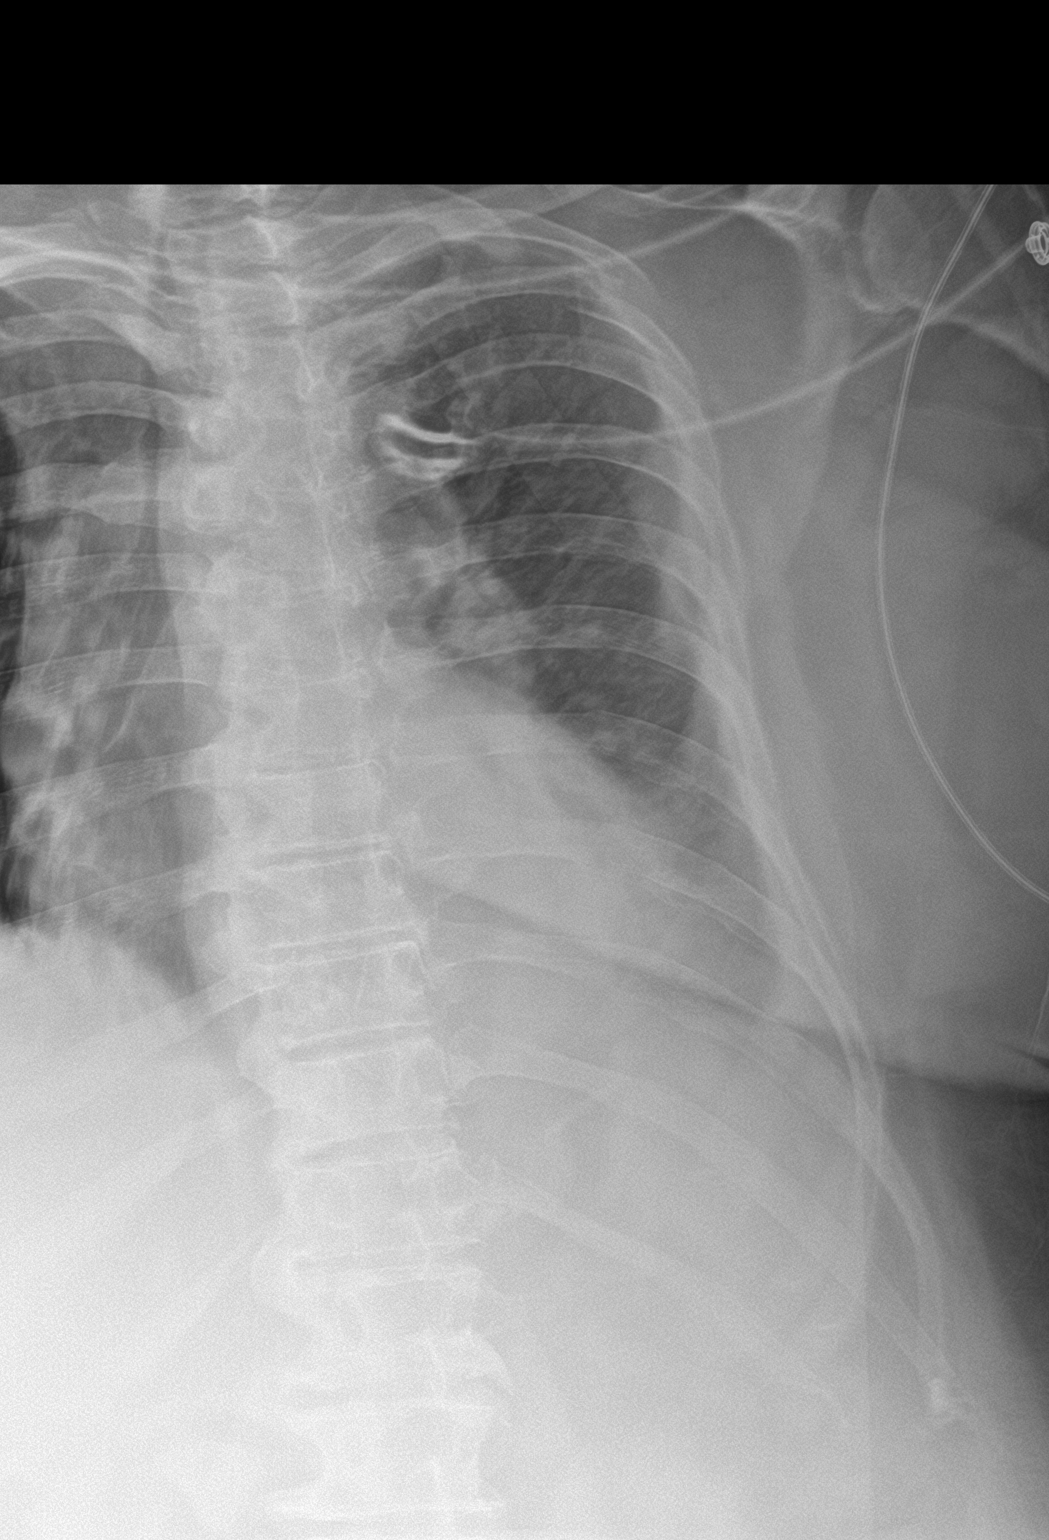

[rib ap obl]
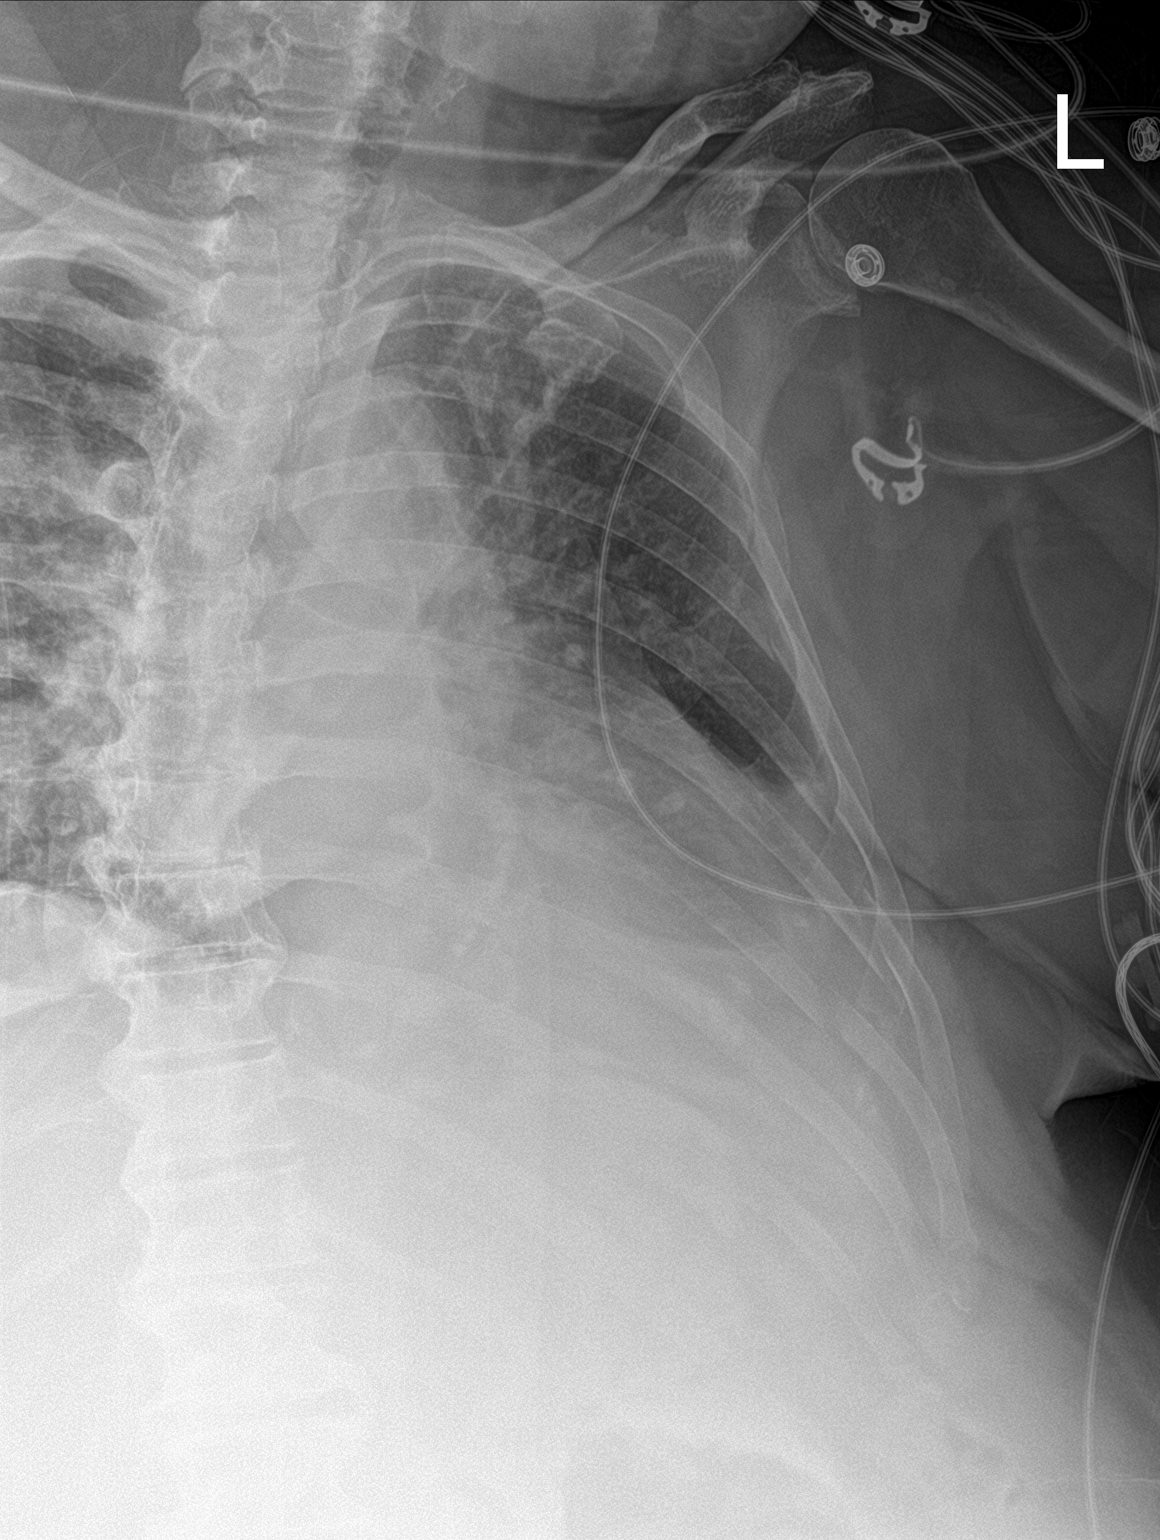

[2 of 2 positions shown; findings below may reference images not displayed]

FINDINGS: Multiple acute left rib fractures involving the third through eighth
ribs laterally. These are best demonstrated on the AP oblique view.

Degenerative changes noted spine. Heart is mildly enlarged. Small
left effusion and left basilar atelectasis/consolidation suspected.
IMPRESSION: Acute left third through eighth rib fractures.

No large pneumothorax

Suspected small left effusion and left basilar
atelectasis/consolidation

## 2018-07-02 IMAGING — CR DG HAND 2V*L*
2 series · 2 of 2 positions shown · non-contrast
Comparison: None.

CLINICAL DATA: Fall down to stairs with left hand pain.

EXAM:
LEFT HAND - 2 VIEW

[hand pa]
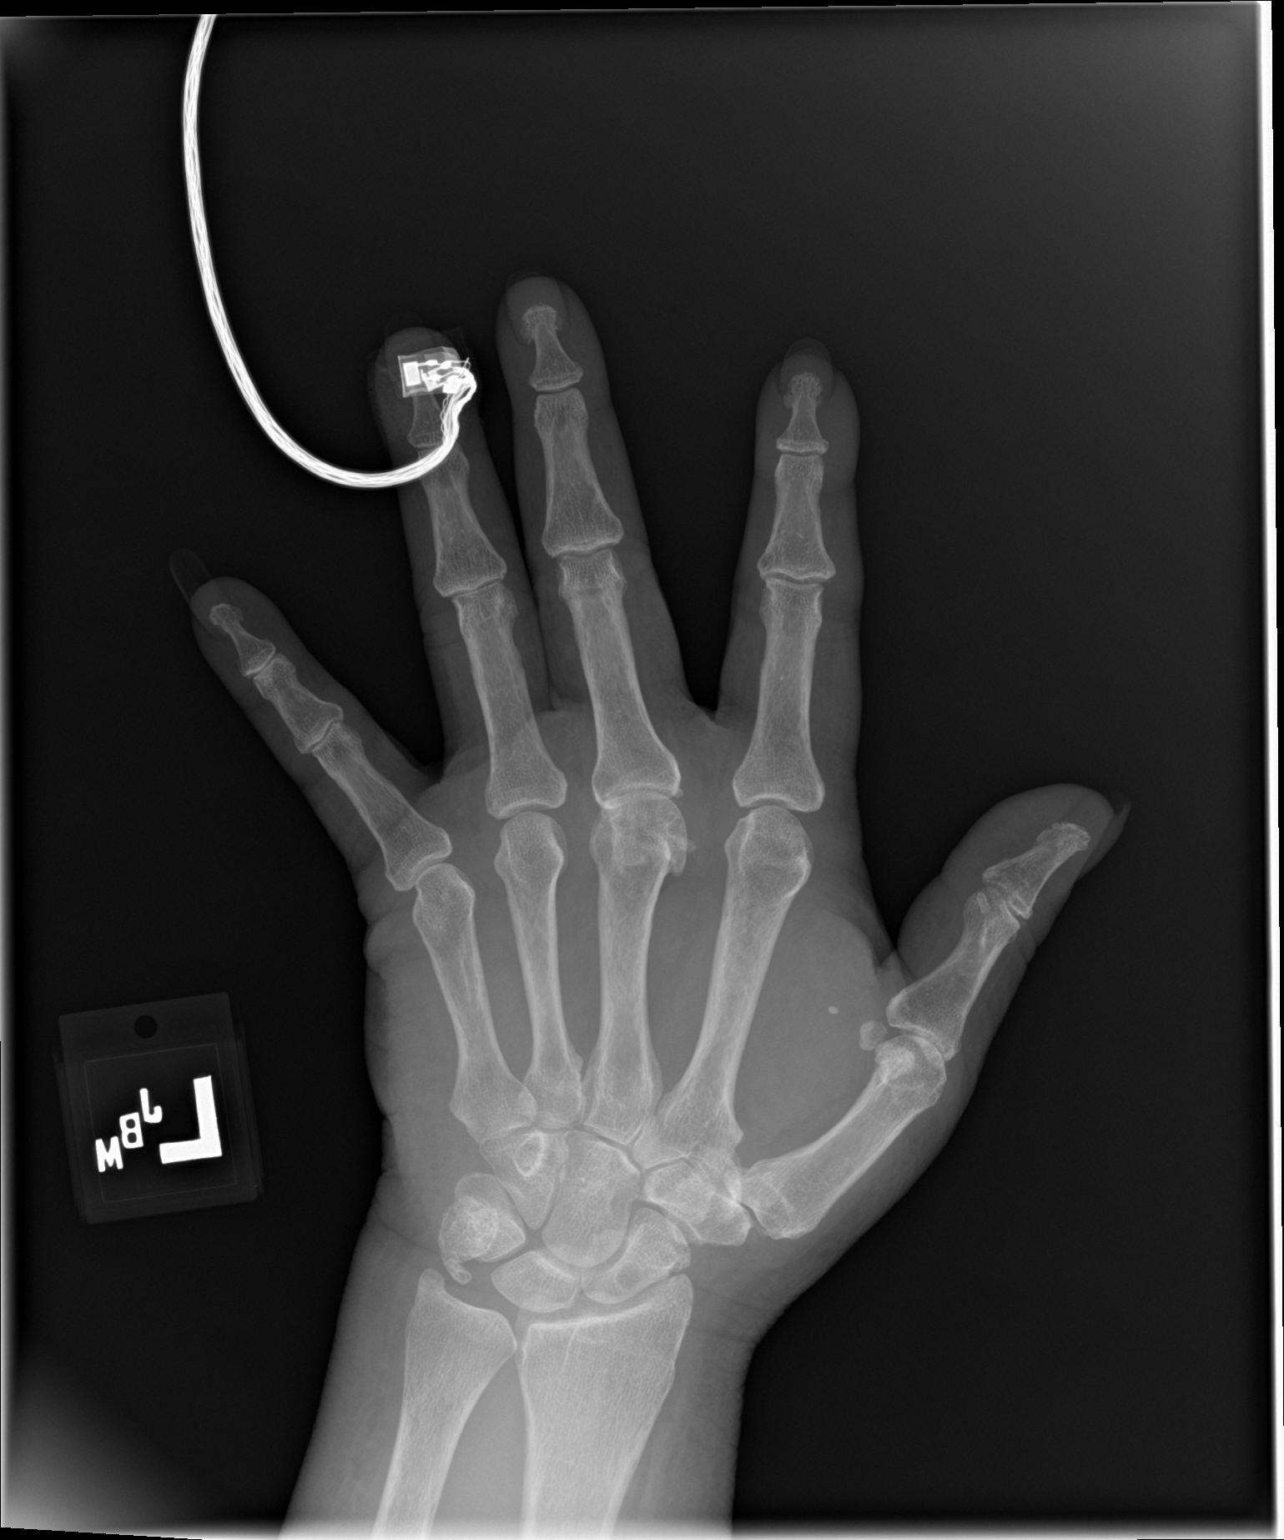

[hand lat]
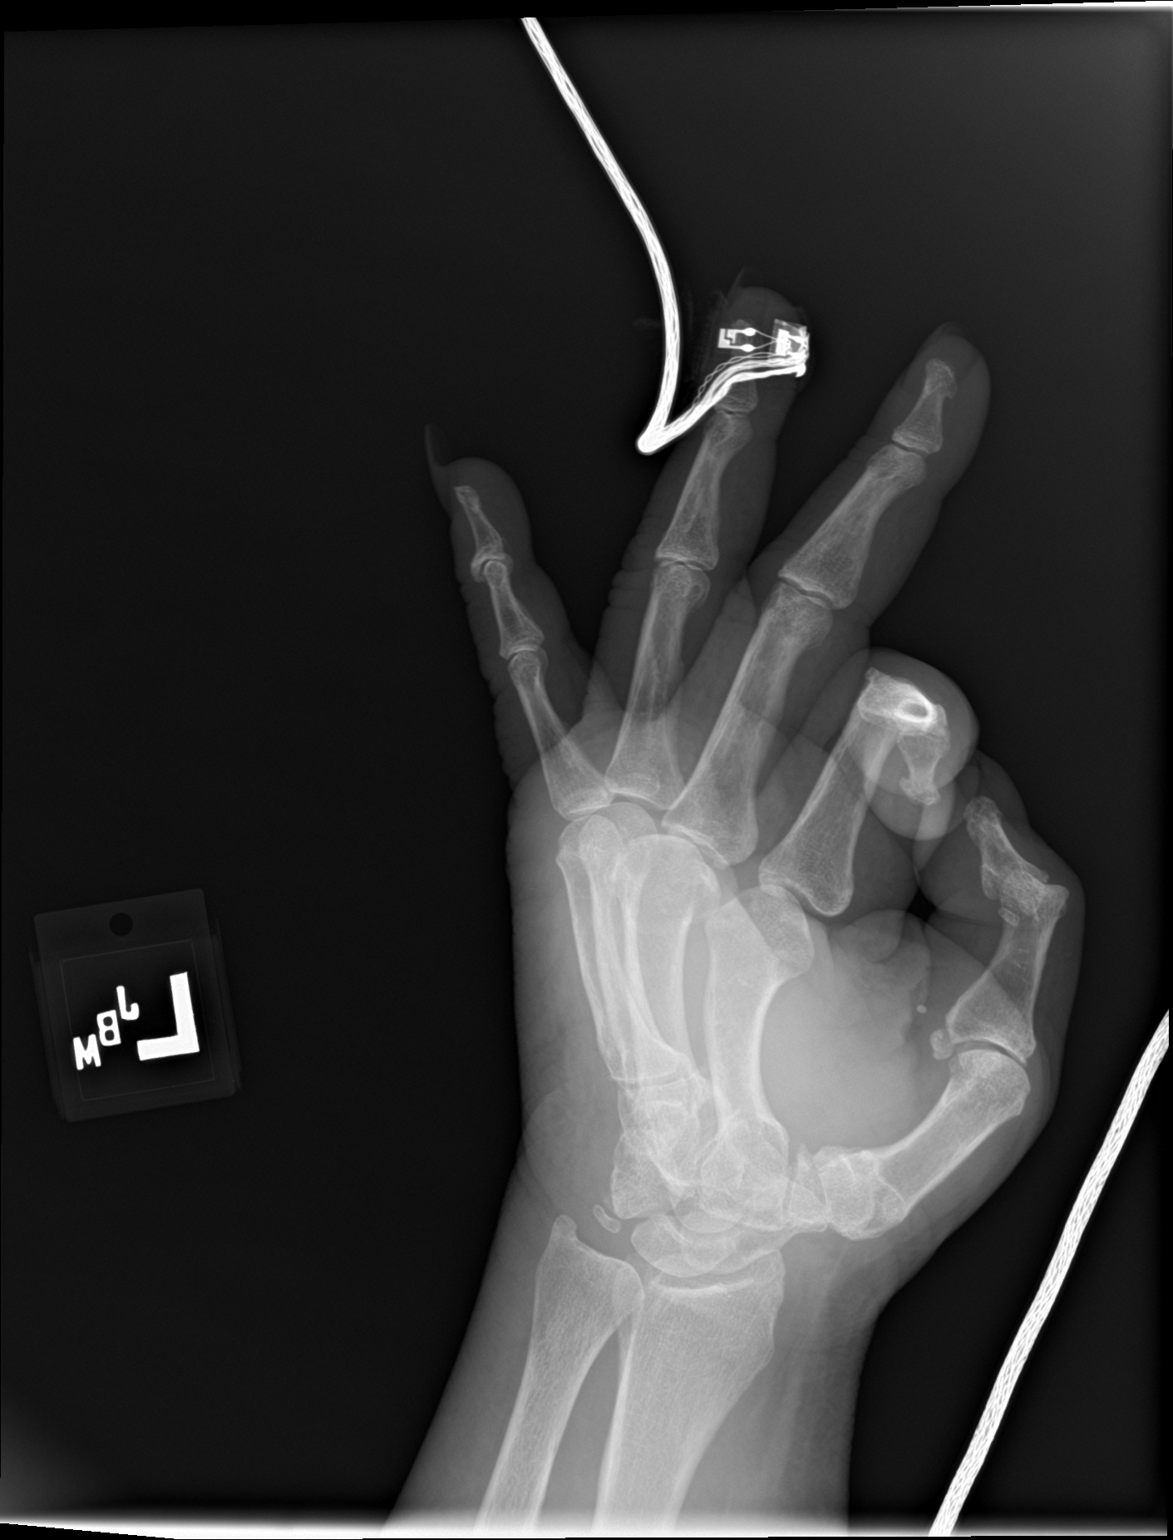

[2 of 2 positions shown; findings below may reference images not displayed]

FINDINGS: Pulse oximeter obscures the fourth distal phalanx. Mild degenerative
change over the radiocarpal joint and carpal bones as well as the
third MCP joint. No acute fracture or dislocation.
IMPRESSION: No acute findings.

## 2018-07-05 DIAGNOSIS — H25012 Cortical age-related cataract, left eye: Secondary | ICD-10-CM | POA: Diagnosis not present

## 2018-07-05 DIAGNOSIS — E113293 Type 2 diabetes mellitus with mild nonproliferative diabetic retinopathy without macular edema, bilateral: Secondary | ICD-10-CM | POA: Diagnosis not present

## 2018-07-05 DIAGNOSIS — H2512 Age-related nuclear cataract, left eye: Secondary | ICD-10-CM | POA: Diagnosis not present

## 2018-07-05 DIAGNOSIS — E119 Type 2 diabetes mellitus without complications: Secondary | ICD-10-CM | POA: Diagnosis not present

## 2018-07-11 DIAGNOSIS — N183 Chronic kidney disease, stage 3 (moderate): Secondary | ICD-10-CM | POA: Diagnosis not present

## 2018-07-11 DIAGNOSIS — G4733 Obstructive sleep apnea (adult) (pediatric): Secondary | ICD-10-CM | POA: Diagnosis not present

## 2018-07-11 DIAGNOSIS — N184 Chronic kidney disease, stage 4 (severe): Secondary | ICD-10-CM | POA: Diagnosis not present

## 2018-07-11 DIAGNOSIS — I129 Hypertensive chronic kidney disease with stage 1 through stage 4 chronic kidney disease, or unspecified chronic kidney disease: Secondary | ICD-10-CM | POA: Diagnosis not present

## 2018-07-11 DIAGNOSIS — N2581 Secondary hyperparathyroidism of renal origin: Secondary | ICD-10-CM | POA: Diagnosis not present

## 2018-07-11 DIAGNOSIS — E1122 Type 2 diabetes mellitus with diabetic chronic kidney disease: Secondary | ICD-10-CM | POA: Diagnosis not present

## 2018-07-25 ENCOUNTER — Other Ambulatory Visit: Payer: Self-pay

## 2018-07-25 DIAGNOSIS — N185 Chronic kidney disease, stage 5: Secondary | ICD-10-CM

## 2018-08-08 ENCOUNTER — Ambulatory Visit (INDEPENDENT_AMBULATORY_CARE_PROVIDER_SITE_OTHER): Payer: PPO | Admitting: Vascular Surgery

## 2018-08-08 ENCOUNTER — Ambulatory Visit (INDEPENDENT_AMBULATORY_CARE_PROVIDER_SITE_OTHER)
Admission: RE | Admit: 2018-08-08 | Discharge: 2018-08-08 | Disposition: A | Discharge status: Home / Self Care | Payer: PPO | Source: Ambulatory Visit | Attending: Vascular Surgery | Admitting: Vascular Surgery

## 2018-08-08 ENCOUNTER — Ambulatory Visit (HOSPITAL_COMMUNITY)
Admission: RE | Admit: 2018-08-08 | Discharge: 2018-08-08 | Disposition: A | Discharge status: Home / Self Care | Payer: PPO | Source: Ambulatory Visit | Attending: Vascular Surgery | Admitting: Vascular Surgery

## 2018-08-08 ENCOUNTER — Encounter: Payer: Self-pay | Admitting: Vascular Surgery

## 2018-08-08 ENCOUNTER — Other Ambulatory Visit: Payer: Self-pay

## 2018-08-08 VITALS — BP 174/81 | HR 77 | Resp 18 | Ht 59.0 in | Wt 250.2 lb

## 2018-08-08 DIAGNOSIS — N185 Chronic kidney disease, stage 5: Secondary | ICD-10-CM | POA: Diagnosis present

## 2018-08-08 DIAGNOSIS — N184 Chronic kidney disease, stage 4 (severe): Secondary | ICD-10-CM

## 2018-08-08 NOTE — H&P (View-Only) (Signed)
REASON FOR CONSULT:    Stage 4 chronic kidney disease.  Evaluate for hemodialysis access.  The consult is requested by Dr. Pearson Grippe.  HPI:   Jean Mcclain is a pleasant 72 y.o. female, who is referred for evaluation for hemodialysis access.  Patient has stage 4 chronic kidney disease.  I have reviewed the notes from the referring office.  The patient was seen on 10-19.  GFR was 16.  Given the progression of her renal insufficiency she sent for evaluation.  She does have a history of secondary hyperparathyroidism and morbid obesity.  In addition she has diabetes type 2 and hypertension.  She does have some dyspnea on exertion.  She denies any other uremic symptoms.  Specifically she denies nausea, vomiting, anorexia, or palpitations.  Her activity is very limited and that we had to get her a wheelchair to get her back to the front office.  She has morbid obesity.  She is right-handed.  She is not on dialysis.  She had a DVT over a year ago in the left leg and has had some chronic leg swelling.  She has been on Eliquis since that time.  Past Medical History:  Diagnosis Date  . Arthritis   . Chronic kidney disease   . Diabetes mellitus, type 2 ([place])   . Facet joint disease   . GERD (gastroesophageal reflux disease)   . Hyperlipidemia   . Hypertension   . OSA (obstructive sleep apnea)    does not use CPAP   . Osteopenia   . Shortness of breath    with exertion  . Vitamin D deficiency     Family History  Problem Relation Age of Onset  . Leukemia Mother   . Hypertension Brother   . Emphysema Father     SOCIAL HISTORY: Social History   Socioeconomic History  . Marital status: Divorced    Spouse name: Not on file  . Number of children: Not on file  . Years of education: Not on file  . Highest education level: Not on file  Occupational History  . Not on file  Social Needs  . Financial resource strain: Not on file  . Food insecurity:    Worry: Not on file   Inability: Not on file  . Transportation needs:    Medical: Not on file    Non-medical: Not on file  Tobacco Use  . Smoking status: Former Smoker    Years: 27.00    Types: Cigarettes    Last attempt to quit: 10/10/1981    Years since quitting: 36.8  . Smokeless tobacco: Never Used  Substance and Sexual Activity  . Alcohol use: No  . Drug use: No  . Sexual activity: Not on file  Lifestyle  . Physical activity:    Days per week: Not on file    Minutes per session: Not on file  . Stress: Not on file  Relationships  . Social connections:    Talks on phone: Not on file    Gets together: Not on file    Attends religious service: Not on file    Active member of club or organization: Not on file    Attends meetings of clubs or organizations: Not on file    Relationship status: Not on file  . Intimate partner violence:    Fear of current or ex partner: Not on file    Emotionally abused: Not on file    Physically abused: Not on file  Forced sexual activity: Not on file  Other Topics Concern  . Not on file  Social History Narrative  . Not on file    No Known Allergies  Current Outpatient Medications  Medication Sig Dispense Refill  . allopurinol (ZYLOPRIM) 100 MG tablet Take 100 mg by mouth daily.    Marland Kitchen amLODipine (NORVASC) 10 MG tablet Take 1 tablet (10 mg total) by mouth daily. (Patient not taking: Reported on 05/28/2018) 30 tablet 0  . apixaban (ELIQUIS) 5 MG TABS tablet Take 2 tablets (10 mg total) by mouth 2 (two) times daily. Then on 03/17/17, take 1 tabet (5 mg) two times daily. 69 tablet 0  . bisacodyl (DULCOLAX) 5 MG EC tablet Take 1 tablet (5 mg total) by mouth daily as needed for moderate constipation. (Patient not taking: Reported on 05/28/2018) 30 tablet 0  . camphor-menthol (SARNA) lotion Apply topically as needed for itching. (Patient not taking: Reported on 05/28/2018) 222 mL 0  . cefUROXime (CEFTIN) 500 MG tablet Take 1 tablet (500 mg total) by mouth daily. (Patient  not taking: Reported on 05/28/2018) 5 tablet 0  . colchicine 0.6 MG tablet Take 0.6 mg by mouth daily.    . cyclobenzaprine (FLEXERIL) 10 MG tablet Take 10 mg by mouth 2 (two) times daily.     . ergocalciferol (VITAMIN D2) 50000 UNITS capsule Take 50,000 Units by mouth once a week. Patient takes on Fridays    . febuxostat (ULORIC) 40 MG tablet Take 40 mg by mouth daily.    . furosemide (LASIX) 20 MG tablet Take 1 tablet (20 mg total) by mouth daily. 30 tablet 0  . gabapentin (NEURONTIN) 300 MG capsule Take 300 mg by mouth 3 (three) times daily.    . insulin NPH Human (HUMULIN N,NOVOLIN N) 100 UNIT/ML injection Inject 15-75 Units into the skin See admin instructions. Use 65 units every morning then use 15 units at lunch then use 75 units at dinner    . insulin regular (NOVOLIN R,HUMULIN R) 100 units/mL injection Inject 30 Units into the skin 3 (three) times daily before meals.    . lovastatin (MEVACOR) 40 MG tablet Take 40 mg by mouth daily.     Marland Kitchen omeprazole (PRILOSEC) 20 MG capsule Take 20 mg by mouth daily.    Marland Kitchen oxyCODONE (OXY IR/ROXICODONE) 5 MG immediate release tablet Take 1 tablet (5 mg total) by mouth every 4 (four) hours as needed for moderate pain. (Patient not taking: Reported on 05/28/2018) 10 tablet 0  . rOPINIRole (REQUIP) 4 MG tablet Take 4 mg by mouth 2 (two) times daily.     . sodium chloride (OCEAN) 0.65 % SOLN nasal spray Place 1 spray into both nostrils as needed for congestion (nose irritation).  0  . traMADol (ULTRAM) 50 MG tablet Take 1 tablet (50 mg total) by mouth every 8 (eight) hours as needed. (Patient not taking: Reported on 06/28/2018) 30 tablet 0   Current Facility-Administered Medications  Medication Dose Route Frequency Provider Last Rate Last Dose  . betamethasone acetate-betamethasone sodium phosphate (CELESTONE) injection 3 mg  3 mg Intramuscular Once Edrick Kins, DPM        REVIEW OF SYSTEMS:  [X]  denotes positive finding, [ ]  denotes negative finding Cardiac   Comments:  Chest pain or chest pressure:    Shortness of breath upon exertion: x   Short of breath when lying flat: x   Irregular heart rhythm:        Vascular    Pain in  calf, thigh, or hip brought on by ambulation: x   Pain in feet at night that wakes you up from your sleep:     Blood clot in your veins: x   Leg swelling:  x       Pulmonary    Oxygen at home:    Productive cough:  x   Wheezing:  x       Neurologic    Sudden weakness in arms or legs:     Sudden numbness in arms or legs:     Sudden onset of difficulty speaking or slurred speech:    Temporary loss of vision in one eye:     Problems with dizziness:         Gastrointestinal    Blood in stool:     Vomited blood:         Genitourinary    Burning when urinating:     Blood in urine:        Psychiatric    Major depression:         Hematologic    Bleeding problems:    Problems with blood clotting too easily:        Skin    Rashes or ulcers:        Constitutional    Fever or chills: x    PHYSICAL EXAM:   Vitals:   08/08/18 0913  BP: (!) 174/81  Pulse: 77  Resp: 18  SpO2: 93%  Weight: 250 lb 3.2 oz (113.5 kg)  Height: 4\' 11"  (1.499 m)    GENERAL: The patient is a well-nourished female, in no acute distress. The vital signs are documented above. CARDIAC: There is a regular rate and rhythm.  VASCULAR: I do not detect carotid bruits. She has palpable brachial and radial pulses bilaterally. She has bilateral lower extremity swelling which is more significant on the left side. PULMONARY: There is good air exchange bilaterally without wheezing or rales. ABDOMEN: Soft and non-tender. MUSCULOSKELETAL: There are no major deformities or cyanosis. NEUROLOGIC: No focal weakness or paresthesias are detected. SKIN: There are no ulcers or rashes noted. PSYCHIATRIC: The patient has a normal affect.  DATA:    UPPER EXTREMITY ARTERIAL DUPLEX: I have independently interpreted her upper extremity arterial  duplex scan.  On the right side there is a triphasic radial and ulnar signal.  The brachial artery measures 0.43 cm in diameter.  On the left side there is a triphasic radial and ulnar signal.  The brachial artery measures 0.49 cm in maximum diameter.  UPPER EXTREMITY VEIN MAP: I have independently interpreted her upper extremity vein map.  On the right side the upper arm cephalic vein is marginal in size.  The basilic vein is marginal in size also.  On the left side the forearm and upper arm cephalic vein are small.  The basilic vein is marginal in size also.   ASSESSMENT & PLAN:   STAGE IV CHRONIC KIDNEY DISEASE: This patient has stage IV chronic kidney disease.  Her veins are on the small size and it looks like her best option for a fistula would be a right brachiocephalic fistula.   The vein is somewhat small however I think this is her best chance. I have explained the indications for placement of an AV fistula or AV graft. I've explained that if at all possible we will place an AV fistula.  I have reviewed the risks of placement of an AV fistula including but not limited to: failure of  the fistula to mature, need for subsequent interventions, and thrombosis. In addition I have reviewed the potential complications of placement of an AV graft. These risks include, but are not limited to, graft thrombosis, graft infection, wound healing problems, bleeding, arm swelling, and steal syndrome.  I would be reluctant to place a graft at this point given that she is stage IV.  Therefore I think we should attempt a fistula in the right upper arm. All the patient's questions were answered and they are agreeable to proceed with surgery.  Her surgery is scheduled for 08/20/2018.  We will hold her Eliquis for 48 hours prior to the procedure.   Deitra Mayo [facility] [phone]

## 2018-08-08 NOTE — Progress Notes (Signed)
REASON FOR CONSULT:    Stage 4 chronic kidney disease.  Evaluate for hemodialysis access.  The consult is requested by Dr. Pearson Grippe.  HPI:   Jean Mcclain is a pleasant 72 y.o. female, who is referred for evaluation for hemodialysis access.  Patient has stage 4 chronic kidney disease.  I have reviewed the notes from the referring office.  The patient was seen on 10-19.  GFR was 16.  Given the progression of her renal insufficiency she sent for evaluation.  She does have a history of secondary hyperparathyroidism and morbid obesity.  In addition she has diabetes type 2 and hypertension.  She does have some dyspnea on exertion.  She denies any other uremic symptoms.  Specifically she denies nausea, vomiting, anorexia, or palpitations.  Her activity is very limited and that we had to get her a wheelchair to get her back to the front office.  She has morbid obesity.  She is right-handed.  She is not on dialysis.  She had a DVT over a year ago in the left leg and has had some chronic leg swelling.  She has been on Eliquis since that time.  Past Medical History:  Diagnosis Date  . Arthritis   . Chronic kidney disease   . Diabetes mellitus, type 2 ([place])   . Facet joint disease   . GERD (gastroesophageal reflux disease)   . Hyperlipidemia   . Hypertension   . OSA (obstructive sleep apnea)    does not use CPAP   . Osteopenia   . Shortness of breath    with exertion  . Vitamin D deficiency     Family History  Problem Relation Age of Onset  . Leukemia Mother   . Hypertension Brother   . Emphysema Father     SOCIAL HISTORY: Social History   Socioeconomic History  . Marital status: Divorced    Spouse name: Not on file  . Number of children: Not on file  . Years of education: Not on file  . Highest education level: Not on file  Occupational History  . Not on file  Social Needs  . Financial resource strain: Not on file  . Food insecurity:    Worry: Not on file   Inability: Not on file  . Transportation needs:    Medical: Not on file    Non-medical: Not on file  Tobacco Use  . Smoking status: Former Smoker    Years: 27.00    Types: Cigarettes    Last attempt to quit: 10/10/1981    Years since quitting: 36.8  . Smokeless tobacco: Never Used  Substance and Sexual Activity  . Alcohol use: No  . Drug use: No  . Sexual activity: Not on file  Lifestyle  . Physical activity:    Days per week: Not on file    Minutes per session: Not on file  . Stress: Not on file  Relationships  . Social connections:    Talks on phone: Not on file    Gets together: Not on file    Attends religious service: Not on file    Active member of club or organization: Not on file    Attends meetings of clubs or organizations: Not on file    Relationship status: Not on file  . Intimate partner violence:    Fear of current or ex partner: Not on file    Emotionally abused: Not on file    Physically abused: Not on file  Forced sexual activity: Not on file  Other Topics Concern  . Not on file  Social History Narrative  . Not on file    No Known Allergies  Current Outpatient Medications  Medication Sig Dispense Refill  . allopurinol (ZYLOPRIM) 100 MG tablet Take 100 mg by mouth daily.    Marland Kitchen amLODipine (NORVASC) 10 MG tablet Take 1 tablet (10 mg total) by mouth daily. (Patient not taking: Reported on 05/28/2018) 30 tablet 0  . apixaban (ELIQUIS) 5 MG TABS tablet Take 2 tablets (10 mg total) by mouth 2 (two) times daily. Then on 03/17/17, take 1 tabet (5 mg) two times daily. 69 tablet 0  . bisacodyl (DULCOLAX) 5 MG EC tablet Take 1 tablet (5 mg total) by mouth daily as needed for moderate constipation. (Patient not taking: Reported on 05/28/2018) 30 tablet 0  . camphor-menthol (SARNA) lotion Apply topically as needed for itching. (Patient not taking: Reported on 05/28/2018) 222 mL 0  . cefUROXime (CEFTIN) 500 MG tablet Take 1 tablet (500 mg total) by mouth daily. (Patient  not taking: Reported on 05/28/2018) 5 tablet 0  . colchicine 0.6 MG tablet Take 0.6 mg by mouth daily.    . cyclobenzaprine (FLEXERIL) 10 MG tablet Take 10 mg by mouth 2 (two) times daily.     . ergocalciferol (VITAMIN D2) 50000 UNITS capsule Take 50,000 Units by mouth once a week. Patient takes on Fridays    . febuxostat (ULORIC) 40 MG tablet Take 40 mg by mouth daily.    . furosemide (LASIX) 20 MG tablet Take 1 tablet (20 mg total) by mouth daily. 30 tablet 0  . gabapentin (NEURONTIN) 300 MG capsule Take 300 mg by mouth 3 (three) times daily.    . insulin NPH Human (HUMULIN N,NOVOLIN N) 100 UNIT/ML injection Inject 15-75 Units into the skin See admin instructions. Use 65 units every morning then use 15 units at lunch then use 75 units at dinner    . insulin regular (NOVOLIN R,HUMULIN R) 100 units/mL injection Inject 30 Units into the skin 3 (three) times daily before meals.    . lovastatin (MEVACOR) 40 MG tablet Take 40 mg by mouth daily.     Marland Kitchen omeprazole (PRILOSEC) 20 MG capsule Take 20 mg by mouth daily.    Marland Kitchen oxyCODONE (OXY IR/ROXICODONE) 5 MG immediate release tablet Take 1 tablet (5 mg total) by mouth every 4 (four) hours as needed for moderate pain. (Patient not taking: Reported on 05/28/2018) 10 tablet 0  . rOPINIRole (REQUIP) 4 MG tablet Take 4 mg by mouth 2 (two) times daily.     . sodium chloride (OCEAN) 0.65 % SOLN nasal spray Place 1 spray into both nostrils as needed for congestion (nose irritation).  0  . traMADol (ULTRAM) 50 MG tablet Take 1 tablet (50 mg total) by mouth every 8 (eight) hours as needed. (Patient not taking: Reported on 06/28/2018) 30 tablet 0   Current Facility-Administered Medications  Medication Dose Route Frequency Provider Last Rate Last Dose  . betamethasone acetate-betamethasone sodium phosphate (CELESTONE) injection 3 mg  3 mg Intramuscular Once Edrick Kins, DPM        REVIEW OF SYSTEMS:  [X]  denotes positive finding, [ ]  denotes negative finding Cardiac   Comments:  Chest pain or chest pressure:    Shortness of breath upon exertion: x   Short of breath when lying flat: x   Irregular heart rhythm:        Vascular    Pain in  calf, thigh, or hip brought on by ambulation: x   Pain in feet at night that wakes you up from your sleep:     Blood clot in your veins: x   Leg swelling:  x       Pulmonary    Oxygen at home:    Productive cough:  x   Wheezing:  x       Neurologic    Sudden weakness in arms or legs:     Sudden numbness in arms or legs:     Sudden onset of difficulty speaking or slurred speech:    Temporary loss of vision in one eye:     Problems with dizziness:         Gastrointestinal    Blood in stool:     Vomited blood:         Genitourinary    Burning when urinating:     Blood in urine:        Psychiatric    Major depression:         Hematologic    Bleeding problems:    Problems with blood clotting too easily:        Skin    Rashes or ulcers:        Constitutional    Fever or chills: x    PHYSICAL EXAM:   Vitals:   08/08/18 0913  BP: (!) 174/81  Pulse: 77  Resp: 18  SpO2: 93%  Weight: 250 lb 3.2 oz (113.5 kg)  Height: 4\' 11"  (1.499 m)    GENERAL: The patient is a well-nourished female, in no acute distress. The vital signs are documented above. CARDIAC: There is a regular rate and rhythm.  VASCULAR: I do not detect carotid bruits. She has palpable brachial and radial pulses bilaterally. She has bilateral lower extremity swelling which is more significant on the left side. PULMONARY: There is good air exchange bilaterally without wheezing or rales. ABDOMEN: Soft and non-tender. MUSCULOSKELETAL: There are no major deformities or cyanosis. NEUROLOGIC: No focal weakness or paresthesias are detected. SKIN: There are no ulcers or rashes noted. PSYCHIATRIC: The patient has a normal affect.  DATA:    UPPER EXTREMITY ARTERIAL DUPLEX: I have independently interpreted her upper extremity arterial  duplex scan.  On the right side there is a triphasic radial and ulnar signal.  The brachial artery measures 0.43 cm in diameter.  On the left side there is a triphasic radial and ulnar signal.  The brachial artery measures 0.49 cm in maximum diameter.  UPPER EXTREMITY VEIN MAP: I have independently interpreted her upper extremity vein map.  On the right side the upper arm cephalic vein is marginal in size.  The basilic vein is marginal in size also.  On the left side the forearm and upper arm cephalic vein are small.  The basilic vein is marginal in size also.   ASSESSMENT & PLAN:   STAGE IV CHRONIC KIDNEY DISEASE: This patient has stage IV chronic kidney disease.  Her veins are on the small size and it looks like her best option for a fistula would be a right brachiocephalic fistula.   The vein is somewhat small however I think this is her best chance. I have explained the indications for placement of an AV fistula or AV graft. I've explained that if at all possible we will place an AV fistula.  I have reviewed the risks of placement of an AV fistula including but not limited to: failure of  the fistula to mature, need for subsequent interventions, and thrombosis. In addition I have reviewed the potential complications of placement of an AV graft. These risks include, but are not limited to, graft thrombosis, graft infection, wound healing problems, bleeding, arm swelling, and steal syndrome.  I would be reluctant to place a graft at this point given that she is stage IV.  Therefore I think we should attempt a fistula in the right upper arm. All the patient's questions were answered and they are agreeable to proceed with surgery.  Her surgery is scheduled for 08/20/2018.  We will hold her Eliquis for 48 hours prior to the procedure.   Deitra Mayo [facility] [phone]

## 2018-08-13 ENCOUNTER — Other Ambulatory Visit: Payer: Self-pay | Admitting: *Deleted

## 2018-08-13 DIAGNOSIS — Z1231 Encounter for screening mammogram for malignant neoplasm of breast: Secondary | ICD-10-CM | POA: Diagnosis not present

## 2018-08-16 ENCOUNTER — Encounter (HOSPITAL_COMMUNITY): Payer: Self-pay | Admitting: *Deleted

## 2018-08-17 ENCOUNTER — Other Ambulatory Visit: Payer: Self-pay

## 2018-08-17 ENCOUNTER — Encounter (HOSPITAL_COMMUNITY): Payer: Self-pay | Admitting: *Deleted

## 2018-08-17 NOTE — Progress Notes (Addendum)
Jean Mcclain denies chest pain, she does get short of breath with activity.  Jean Mcclain reports that her CBGs run 27- 129. I instructed patient to take 27 units of NPH on Sunday evening.  I instructed patient that if CBG > 70 to take 27 units of NPH Insulin Monday am. I instructed patient to check CBG after awaking and every 2 hours until arrival  to the hospital.  I Instructed patient if CBG is less than 70 to drink 1/2 cup of a clear juice or regular ginger ale or sprite.. Recheck CBG in 15 minutes then call pre- op desk at [phone] for further instructions. If scheduled to receive Insulin, do not take Insulin.  I informed patient that she need someone to be with her for the first 24 hours after surgery.  Patient said she doesn't have anyone, "My brother will not stay with me."  "i'll see if I can stay with him and sleep in the recliner." I

## 2018-08-19 ENCOUNTER — Encounter (HOSPITAL_COMMUNITY): Payer: Self-pay | Admitting: Anesthesiology

## 2018-08-19 NOTE — Anesthesia Preprocedure Evaluation (Addendum)
Anesthesia Evaluation  Patient identified by MRN, date of birth, ID band Patient awake    Reviewed: Allergy & Precautions, NPO status , Patient's Chart, lab work & pertinent test results  Airway Mallampati: III  TM Distance: >3 FB Neck ROM: Full    Dental  (+) Dental Advisory Given, Missing,    Pulmonary sleep apnea , former smoker,     + wheezing      Cardiovascular hypertension, Pt. on home beta blockers +CHF   Rhythm:Regular Rate:Normal     Neuro/Psych Anxiety negative neurological ROS     GI/Hepatic GERD  Medicated,  Endo/Other  diabetes, Type 2, Insulin Dependent  Renal/GU ESRFRenal disease     Musculoskeletal   Abdominal   Peds  Hematology   Anesthesia Other Findings   Reproductive/Obstetrics                            Anesthesia Physical Anesthesia Plan  ASA: III  Anesthesia Plan: MAC   Post-op Pain Management:    Induction: Intravenous  PONV Risk Score and Plan: 3 and Ondansetron and Treatment may vary due to age or medical condition  Airway Management Planned: Natural Airway and Simple Face Mask  Additional Equipment:   Intra-op Plan:   Post-operative Plan:   Informed Consent: I have reviewed the patients History and Physical, chart, labs and discussed the procedure including the risks, benefits and alternatives for the proposed anesthesia with the patient or authorized representative who has indicated his/her understanding and acceptance.   Dental advisory given  Plan Discussed with: CRNA  Anesthesia Plan Comments: (Nebulizer treatment in Short Stay)       Anesthesia Quick Evaluation

## 2018-08-20 ENCOUNTER — Ambulatory Visit (HOSPITAL_COMMUNITY)
Admission: RE | Admit: 2018-08-20 | Discharge: 2018-08-20 | Disposition: A | Discharge status: Home / Self Care | Payer: PPO | Source: Ambulatory Visit | Attending: Vascular Surgery | Admitting: Vascular Surgery

## 2018-08-20 ENCOUNTER — Ambulatory Visit (HOSPITAL_COMMUNITY): Payer: PPO | Admitting: Anesthesiology

## 2018-08-20 ENCOUNTER — Encounter (HOSPITAL_COMMUNITY)
Admission: RE | Disposition: A | Discharge status: Home / Self Care | Payer: Self-pay | Source: Ambulatory Visit | Attending: Vascular Surgery

## 2018-08-20 ENCOUNTER — Encounter (HOSPITAL_COMMUNITY): Payer: Self-pay | Admitting: Anesthesiology

## 2018-08-20 DIAGNOSIS — I130 Hypertensive heart and chronic kidney disease with heart failure and stage 1 through stage 4 chronic kidney disease, or unspecified chronic kidney disease: Secondary | ICD-10-CM | POA: Diagnosis not present

## 2018-08-20 DIAGNOSIS — N2581 Secondary hyperparathyroidism of renal origin: Secondary | ICD-10-CM | POA: Insufficient documentation

## 2018-08-20 DIAGNOSIS — Z86718 Personal history of other venous thrombosis and embolism: Secondary | ICD-10-CM | POA: Diagnosis not present

## 2018-08-20 DIAGNOSIS — Z79891 Long term (current) use of opiate analgesic: Secondary | ICD-10-CM | POA: Diagnosis not present

## 2018-08-20 DIAGNOSIS — E785 Hyperlipidemia, unspecified: Secondary | ICD-10-CM | POA: Insufficient documentation

## 2018-08-20 DIAGNOSIS — Z79899 Other long term (current) drug therapy: Secondary | ICD-10-CM | POA: Insufficient documentation

## 2018-08-20 DIAGNOSIS — G4733 Obstructive sleep apnea (adult) (pediatric): Secondary | ICD-10-CM | POA: Insufficient documentation

## 2018-08-20 DIAGNOSIS — M8580 Other specified disorders of bone density and structure, unspecified site: Secondary | ICD-10-CM | POA: Diagnosis not present

## 2018-08-20 DIAGNOSIS — N184 Chronic kidney disease, stage 4 (severe): Secondary | ICD-10-CM | POA: Diagnosis not present

## 2018-08-20 DIAGNOSIS — E559 Vitamin D deficiency, unspecified: Secondary | ICD-10-CM | POA: Insufficient documentation

## 2018-08-20 DIAGNOSIS — M1990 Unspecified osteoarthritis, unspecified site: Secondary | ICD-10-CM | POA: Insufficient documentation

## 2018-08-20 DIAGNOSIS — E1122 Type 2 diabetes mellitus with diabetic chronic kidney disease: Secondary | ICD-10-CM | POA: Insufficient documentation

## 2018-08-20 DIAGNOSIS — Z7901 Long term (current) use of anticoagulants: Secondary | ICD-10-CM | POA: Diagnosis not present

## 2018-08-20 DIAGNOSIS — K219 Gastro-esophageal reflux disease without esophagitis: Secondary | ICD-10-CM | POA: Diagnosis not present

## 2018-08-20 DIAGNOSIS — I129 Hypertensive chronic kidney disease with stage 1 through stage 4 chronic kidney disease, or unspecified chronic kidney disease: Secondary | ICD-10-CM | POA: Insufficient documentation

## 2018-08-20 DIAGNOSIS — Z87891 Personal history of nicotine dependence: Secondary | ICD-10-CM | POA: Diagnosis not present

## 2018-08-20 DIAGNOSIS — Z794 Long term (current) use of insulin: Secondary | ICD-10-CM | POA: Diagnosis not present

## 2018-08-20 DIAGNOSIS — Z6841 Body Mass Index (BMI) 40.0 and over, adult: Secondary | ICD-10-CM | POA: Diagnosis not present

## 2018-08-20 DIAGNOSIS — I5033 Acute on chronic diastolic (congestive) heart failure: Secondary | ICD-10-CM | POA: Diagnosis not present

## 2018-08-20 HISTORY — DX: Varicose veins of unspecified lower extremity with ulcer of unspecified site: L97.909

## 2018-08-20 HISTORY — DX: Anxiety disorder, unspecified: F41.9

## 2018-08-20 HISTORY — PX: AV FISTULA PLACEMENT: SHX1204

## 2018-08-20 HISTORY — DX: Non-pressure chronic ulcer of unspecified part of unspecified lower leg with unspecified severity: I83.009

## 2018-08-20 HISTORY — DX: Acute embolism and thrombosis of unspecified deep veins of unspecified lower extremity: I82.409

## 2018-08-20 HISTORY — DX: Unspecified fracture of shaft of unspecified fibula, initial encounter for closed fracture: S82.409A

## 2018-08-20 LAB — GLUCOSE, CAPILLARY: Glucose-Capillary: 103 mg/dL — ABNORMAL HIGH (ref 70–99)

## 2018-08-20 LAB — POCT I-STAT 4, (NA,K, GLUC, HGB,HCT)
Glucose, Bld: 114 mg/dL — ABNORMAL HIGH (ref 70–99)
HCT: 35 % — ABNORMAL LOW (ref 36.0–46.0)
Hemoglobin: 11.9 g/dL — ABNORMAL LOW (ref 12.0–15.0)
Potassium: 3.1 mmol/L — ABNORMAL LOW (ref 3.5–5.1)
Sodium: 140 mmol/L (ref 135–145)

## 2018-08-20 SURGERY — ARTERIOVENOUS (AV) FISTULA CREATION
Anesthesia: Monitor Anesthesia Care | Laterality: Right

## 2018-08-20 MED ORDER — ALBUTEROL SULFATE (2.5 MG/3ML) 0.083% IN NEBU: 2.5000 mg | INHALATION_SOLUTION | Freq: Once | RESPIRATORY_TRACT | Status: DC

## 2018-08-20 MED ORDER — HEPARIN SODIUM (PORCINE) 1000 UNIT/ML IJ SOLN
INTRAMUSCULAR | Status: DC | PRN
  Administered 2018-08-20: 10000 [IU] via INTRAVENOUS

## 2018-08-20 MED ORDER — 0.9 % SODIUM CHLORIDE (POUR BTL) OPTIME
TOPICAL | Status: DC | PRN
  Administered 2018-08-20: 1000 mL

## 2018-08-20 MED ORDER — MIDAZOLAM HCL 2 MG/2ML IJ SOLN
INTRAMUSCULAR | Status: AC
  Filled 2018-08-20: qty 2

## 2018-08-20 MED ORDER — FENTANYL CITRATE (PF) 100 MCG/2ML IJ SOLN: 25.0000 ug | INTRAMUSCULAR | Status: DC | PRN

## 2018-08-20 MED ORDER — LACTATED RINGERS IV SOLN: INTRAVENOUS | Status: DC

## 2018-08-20 MED ORDER — PROMETHAZINE HCL 25 MG/ML IJ SOLN: 6.2500 mg | INTRAMUSCULAR | Status: DC | PRN

## 2018-08-20 MED ORDER — PROTAMINE SULFATE 10 MG/ML IV SOLN
INTRAVENOUS | Status: DC | PRN
  Administered 2018-08-20: 50 mg via INTRAVENOUS

## 2018-08-20 MED ORDER — ONDANSETRON HCL 4 MG/2ML IJ SOLN
INTRAMUSCULAR | Status: AC
  Filled 2018-08-20: qty 2

## 2018-08-20 MED ORDER — SODIUM CHLORIDE 0.9 % IV SOLN
INTRAVENOUS | Status: DC | PRN
  Administered 2018-08-20: 07:00:00

## 2018-08-20 MED ORDER — ONDANSETRON HCL 4 MG/2ML IJ SOLN
INTRAMUSCULAR | Status: DC | PRN
  Administered 2018-08-20: 4 mg via INTRAVENOUS

## 2018-08-20 MED ORDER — FENTANYL CITRATE (PF) 100 MCG/2ML IJ SOLN
INTRAMUSCULAR | Status: DC | PRN
  Administered 2018-08-20 (×2): 50 ug via INTRAVENOUS

## 2018-08-20 MED ORDER — PROPOFOL 500 MG/50ML IV EMUL
INTRAVENOUS | Status: DC | PRN
  Administered 2018-08-20: 30 ug/kg/min via INTRAVENOUS

## 2018-08-20 MED ORDER — LIDOCAINE-EPINEPHRINE (PF) 1 %-1:200000 IJ SOLN
INTRAMUSCULAR | Status: AC
  Filled 2018-08-20: qty 30

## 2018-08-20 MED ORDER — MIDAZOLAM HCL 5 MG/5ML IJ SOLN
INTRAMUSCULAR | Status: DC | PRN
  Administered 2018-08-20 (×2): 1 mg via INTRAVENOUS

## 2018-08-20 MED ORDER — LIDOCAINE-EPINEPHRINE (PF) 1 %-1:200000 IJ SOLN
INTRAMUSCULAR | Status: DC | PRN
  Administered 2018-08-20: 26 mL

## 2018-08-20 MED ORDER — OXYCODONE HCL 5 MG PO TABS: 5.0000 mg | ORAL_TABLET | ORAL | 0 refills | Status: DC | PRN

## 2018-08-20 MED ORDER — SODIUM CHLORIDE 0.9 % IV SOLN
INTRAVENOUS | Status: DC
  Administered 2018-08-20: 07:00:00 via INTRAVENOUS

## 2018-08-20 MED ORDER — SODIUM CHLORIDE 0.9 % IV SOLN
INTRAVENOUS | Status: AC
  Filled 2018-08-20: qty 1.2

## 2018-08-20 MED ORDER — HEPARIN SODIUM (PORCINE) 1000 UNIT/ML IJ SOLN
INTRAMUSCULAR | Status: AC
  Filled 2018-08-20: qty 1

## 2018-08-20 MED ORDER — LIDOCAINE 2% (20 MG/ML) 5 ML SYRINGE
INTRAMUSCULAR | Status: AC
  Filled 2018-08-20: qty 5

## 2018-08-20 MED ORDER — PROPOFOL 10 MG/ML IV BOLUS
INTRAVENOUS | Status: AC
  Filled 2018-08-20: qty 40

## 2018-08-20 MED ORDER — PROTAMINE SULFATE 10 MG/ML IV SOLN
INTRAVENOUS | Status: AC
  Filled 2018-08-20: qty 5

## 2018-08-20 MED ORDER — FENTANYL CITRATE (PF) 250 MCG/5ML IJ SOLN
INTRAMUSCULAR | Status: AC
  Filled 2018-08-20: qty 5

## 2018-08-20 MED ORDER — ALBUTEROL SULFATE (2.5 MG/3ML) 0.083% IN NEBU
INHALATION_SOLUTION | RESPIRATORY_TRACT | Status: AC
  Administered 2018-08-20: 2.5 mg
  Filled 2018-08-20: qty 3

## 2018-08-20 MED ORDER — CEFAZOLIN SODIUM-DEXTROSE 2-4 GM/100ML-% IV SOLN
2.0000 g | INTRAVENOUS | Status: AC
  Administered 2018-08-20: 2 g via INTRAVENOUS
  Filled 2018-08-20: qty 100

## 2018-08-20 MED ORDER — LIDOCAINE HCL (PF) 1 % IJ SOLN
INTRAMUSCULAR | Status: AC
  Filled 2018-08-20: qty 30

## 2018-08-20 SURGICAL SUPPLY — 36 items
ADH SKN CLS APL DERMABOND .7 (GAUZE/BANDAGES/DRESSINGS) ×1
ARMBAND PINK RESTRICT EXTREMIT (MISCELLANEOUS) ×3 IMPLANT
CANISTER SUCT 3000ML PPV (MISCELLANEOUS) ×2 IMPLANT
CANNULA VESSEL 3MM 2 BLNT TIP (CANNULA) ×2 IMPLANT
CLIP VESOCCLUDE MED 6/CT (CLIP) ×2 IMPLANT
CLIP VESOCCLUDE SM WIDE 6/CT (CLIP) ×4 IMPLANT
COVER PROBE W GEL 5X96 (DRAPES) IMPLANT
COVER WAND RF STERILE (DRAPES) ×2 IMPLANT
DECANTER SPIKE VIAL GLASS SM (MISCELLANEOUS) ×2 IMPLANT
DERMABOND ADVANCED (GAUZE/BANDAGES/DRESSINGS) ×1
DERMABOND ADVANCED .7 DNX12 (GAUZE/BANDAGES/DRESSINGS) ×1 IMPLANT
ELECT REM PT RETURN 9FT ADLT (ELECTROSURGICAL) ×2
ELECTRODE REM PT RTRN 9FT ADLT (ELECTROSURGICAL) ×1 IMPLANT
GLOVE BIO SURGEON STRL SZ7.5 (GLOVE) ×2 IMPLANT
GLOVE BIOGEL PI IND STRL 6.5 (GLOVE) IMPLANT
GLOVE BIOGEL PI IND STRL 7.5 (GLOVE) IMPLANT
GLOVE BIOGEL PI IND STRL 8 (GLOVE) ×1 IMPLANT
GLOVE BIOGEL PI INDICATOR 6.5 (GLOVE) ×2
GLOVE BIOGEL PI INDICATOR 7.5 (GLOVE) ×1
GLOVE BIOGEL PI INDICATOR 8 (GLOVE) ×2
GLOVE ECLIPSE 7.0 STRL STRAW (GLOVE) ×1 IMPLANT
GOWN STRL REUS W/ TWL LRG LVL3 (GOWN DISPOSABLE) ×3 IMPLANT
GOWN STRL REUS W/TWL LRG LVL3 (GOWN DISPOSABLE) ×6
KIT BASIN OR (CUSTOM PROCEDURE TRAY) ×2 IMPLANT
KIT TURNOVER KIT B (KITS) ×2 IMPLANT
NS IRRIG 1000ML POUR BTL (IV SOLUTION) ×2 IMPLANT
PACK CV ACCESS (CUSTOM PROCEDURE TRAY) ×2 IMPLANT
PAD ARMBOARD 7.5X6 YLW CONV (MISCELLANEOUS) ×4 IMPLANT
SPONGE SURGIFOAM ABS GEL 100 (HEMOSTASIS) IMPLANT
SUT PROLENE 6 0 BV (SUTURE) ×3 IMPLANT
SUT VIC AB 3-0 SH 27 (SUTURE) ×4
SUT VIC AB 3-0 SH 27X BRD (SUTURE) ×1 IMPLANT
SUT VICRYL 4-0 PS2 18IN ABS (SUTURE) ×2 IMPLANT
TOWEL GREEN STERILE (TOWEL DISPOSABLE) ×2 IMPLANT
UNDERPAD 30X30 (UNDERPADS AND DIAPERS) ×2 IMPLANT
WATER STERILE IRR 1000ML POUR (IV SOLUTION) ×2 IMPLANT

## 2018-08-20 NOTE — Transfer of Care (Signed)
Immediate Anesthesia Transfer of Care Note  Patient: Jean Mcclain  Procedure(s) Performed: ARTERIOVENOUS (AV) FISTULA CREATION BRACHIOCEPHALIC (Right )  Patient Location: PACU  Anesthesia Type:MAC  Level of Consciousness: awake, alert  and oriented  Airway & Oxygen Therapy: Patient Spontanous Breathing and Patient connected to face mask oxygen  Post-op Assessment: Report given to RN and Post -op Vital signs reviewed and stable  Post vital signs: Reviewed and stable  Last Vitals:  Vitals Value Taken Time  BP    Temp    Pulse    Resp    SpO2      Last Pain:  Vitals:   08/20/18 0636  TempSrc: Oral      Patients Stated Pain Goal: 3 (92/92/44 [id number])  Complications: No apparent anesthesia complications

## 2018-08-20 NOTE — Anesthesia Procedure Notes (Signed)
Procedure Name: MAC Date/Time: 08/20/2018 7:38 AM Performed by: Scheryl Darter, CRNA Pre-anesthesia Checklist: Patient identified, Emergency Drugs available, Suction available, Patient being monitored and Timeout performed Patient Re-evaluated:Patient Re-evaluated prior to induction Oxygen Delivery Method: Simple face mask Placement Confirmation: positive ETCO2

## 2018-08-20 NOTE — Progress Notes (Deleted)
Pt arrived to PACU and on assessment pt presented with subcutaneous emphysema from diaphragm to lower jaw. Dr. Roxan Hockey made aware and present at bedside. MD ordered STAT Chest Xray.   After calling report to receiving RN on [facility] assessed pt before leaving PACU. Writer listened to breath sounds, subcutaneous emphysema noted no new change and assessed chest tube site and chest. No new changes were present. Pt transported to [facility] and as Probation officer was hooking pt up to vital sign machine noticed a significant change to right chest subcutaneous emphysema. Receiving RN updated and assessed pt as well. Dr. Roxan Hockey paged and Tammy, RN talked to MD. Dr. Roxan Hockey updated and made aware of new findings.

## 2018-08-20 NOTE — Progress Notes (Signed)
[name] oxygen saturation dropping to 87% on RA during pt movement sitting upright in the recliner chair. While pt is still in chair and resting oxygen saturation 95% on RA. Dr. Smith Robert made aware. Writer also spoke with Dr. Scot Dock who says this is pt's baseline and he is okay with her being discharged at this time. Pt educated that she needs to wear her cipap and oxygen that she says she has at home.

## 2018-08-20 NOTE — Anesthesia Postprocedure Evaluation (Signed)
Anesthesia Post Note  Patient: Jean Mcclain  Procedure(s) Performed: ARTERIOVENOUS (AV) FISTULA CREATION BRACHIOCEPHALIC (Right )     Patient location during evaluation: PACU Anesthesia Type: MAC Level of consciousness: awake and alert Pain management: pain level controlled Vital Signs Assessment: post-procedure vital signs reviewed and stable Respiratory status: spontaneous breathing, nonlabored ventilation, respiratory function stable and patient connected to nasal cannula oxygen Cardiovascular status: stable and blood pressure returned to baseline Postop Assessment: no apparent nausea or vomiting Anesthetic complications: no Comments: O2 sats are > 95% when awake but falls to high 80's when asleep. Pt informed to use home oxygen and CPAP at home. Surgeon agreeable.    Last Vitals:  Vitals:   08/20/18 1100 08/20/18 1123  BP: (!) 126/58 (!) 110/58  Pulse: 76 71  Resp: 13 20  Temp: 36.7 C   SpO2: 91% 93%    Last Pain:  Vitals:   08/20/18 1123  TempSrc:   PainSc: 0-No pain                 Effie Berkshire

## 2018-08-20 NOTE — Op Note (Signed)
    NAME: Jean Mcclain    MRN: [id number] DOB: Feb 12, 1946    DATE OF OPERATION: 08/20/2018  PREOP DIAGNOSIS:    Stage IV chronic kidney disease  POSTOP DIAGNOSIS:    Same  PROCEDURE:    Right brachiocephalic AV fistula Superficialization of right brachiocephalic AV fistula  SURGEON: Judeth Cornfield. Scot Dock, MD, FACS  ASSIST: Laurence Slate, PA  ANESTHESIA: Local with sedation  EBL: Minimal  INDICATIONS:    [name] is a 72 y.o. female with stage IV chronic kidney disease.  She is not on dialysis.  She presents for new access.  My plan was to place a fistula and not place a graft.  FINDINGS:   The vein was reasonable in size although somewhat deep.  I fully mobilized the vein to the mid upper arm and removed a large amount of fat superficially.  There was a good thrill at the completion of the procedure and a brisk radial and ulnar signal with the Doppler.  TECHNIQUE:   The patient was taken to the operating room and sedated by anesthesia.  The right upper extremity was prepped and draped in usual sterile fashion.  After the skin was anesthetized with 1% lidocaine, a transverse incision was made just above the antecubital level.  Here the brachial artery was dissected free.  Had a good pulse and was good size.  The cephalic vein was dissected free beneath the fascia.  It was about 3 mm in size.  This was ligated distally at the site of a bifurcation.  The 2 branches were connected allowing for a widely spatulated proximal anastomosis.  The brachial artery was clamped proximally and distally after the patient was heparinized.  A longitudinal arteriotomy was made.  The vein was sewn into side to the artery using continuous 6-0 Prolene suture.  At the completion was a good thrill in the fistula.  I then mobilized the vein proximally and debrided a large amount of fat superficially.  I then made a separate longitudinal incision in the mid upper arm after the skin was anesthetized  and again fully mobilized the vein and debulked a large amount of superficial adipose tissue.  Hemostasis was obtained in the wounds.  The incision at the upper arm was closed with a 4-0 Vicryl.  The antecubital incision was closed with a deep layer of 3-0 Vicryl the skin closed with 4-0 Vicryl.  Dermabond was applied.  The patient tolerated the procedure well was transferred to the recovery room in stable condition.  All needle sponge counts were correct.  Deitra Mayo, MD, FACS [facility]  DATE OF DICTATION:   08/20/2018

## 2018-08-20 NOTE — Interval H&P Note (Signed)
History and Physical Interval Note:  08/20/2018 7:17 AM  Jean Mcclain  has presented today for surgery, with the diagnosis of chronic kidney disease  The various methods of treatment have been discussed with the patient and family. After consideration of risks, benefits and other options for treatment, the patient has consented to  Procedure(s): ARTERIOVENOUS (AV) FISTULA CREATION BRACHIOCEPHALIC (Right) as a surgical intervention .  The patient's history has been reviewed, patient examined, no change in status, stable for surgery.  I have reviewed the patient's chart and labs.  Questions were answered to the patient's satisfaction.     Deitra Mayo

## 2018-08-21 ENCOUNTER — Telehealth: Payer: Self-pay | Admitting: Vascular Surgery

## 2018-08-21 ENCOUNTER — Encounter (HOSPITAL_COMMUNITY): Payer: Self-pay | Admitting: Vascular Surgery

## 2018-08-21 ENCOUNTER — Ambulatory Visit: Payer: PPO | Admitting: Sports Medicine

## 2018-08-21 ENCOUNTER — Telehealth: Payer: Self-pay

## 2018-08-21 NOTE — Telephone Encounter (Signed)
Returned patient's call regarding pain in her arm after her procedure yesterday. She has only taken Tylenol, a family member is on the way with her oxycodone rx. Pt. Has not been elevating her arm. Explained to her the importance of elevating arm. Suggested to use a warm washcloth in fist and squeeze it to exercise hand/arm. Also, encouraged her to take oxycodone as soon as possible. Told pt. To call us back if pain does not improve or symptoms worsen.

## 2018-08-21 NOTE — Telephone Encounter (Signed)
sch appt spk to pt mld ltr 09/26/18 11am Dialysis Duplex 1145am p/o MD

## 2018-08-21 NOTE — Telephone Encounter (Signed)
-----   Message from Mena Goes, RN sent at 08/20/2018  9:29 AM EST ----- Regarding: 6 weeks with duplex    ----- Message ----- From: Angelia Mould, MD Sent: 08/20/2018   8:59 AM EST To: [facility] Charge Pool Subject: charge and f/u                                  PROCEDURE:   Right brachiocephalic AV fistula Superficialization of right brachiocephalic AV fistula  SURGEON: Judeth Cornfield. Scot Dock, MD, FACS  ASSIST: Laurence Slate, PA  I would like to see this patient on my schedule in approximately 6 weeks with a duplex at that time to check on the maturation of the fistula.  Thank you. CD

## 2018-08-22 ENCOUNTER — Other Ambulatory Visit: Payer: Self-pay

## 2018-08-22 DIAGNOSIS — N185 Chronic kidney disease, stage 5: Secondary | ICD-10-CM

## 2018-08-22 DIAGNOSIS — N184 Chronic kidney disease, stage 4 (severe): Secondary | ICD-10-CM

## 2018-09-11 ENCOUNTER — Encounter: Payer: Self-pay | Admitting: Sports Medicine

## 2018-09-11 ENCOUNTER — Ambulatory Visit: Payer: PPO | Admitting: Sports Medicine

## 2018-09-11 DIAGNOSIS — B351 Tinea unguium: Secondary | ICD-10-CM | POA: Diagnosis not present

## 2018-09-11 DIAGNOSIS — M79676 Pain in unspecified toe(s): Secondary | ICD-10-CM | POA: Diagnosis not present

## 2018-09-11 DIAGNOSIS — E0842 Diabetes mellitus due to underlying condition with diabetic polyneuropathy: Secondary | ICD-10-CM | POA: Diagnosis not present

## 2018-09-11 DIAGNOSIS — M722 Plantar fascial fibromatosis: Secondary | ICD-10-CM

## 2018-09-11 DIAGNOSIS — I890 Lymphedema, not elsewhere classified: Secondary | ICD-10-CM

## 2018-09-11 DIAGNOSIS — M7752 Other enthesopathy of left foot: Secondary | ICD-10-CM

## 2018-09-11 MED ORDER — TRIAMCINOLONE ACETONIDE 10 MG/ML IJ SUSP
10.0000 mg | Freq: Once | INTRAMUSCULAR | Status: AC
  Administered 2018-09-11: 10 mg

## 2018-09-11 NOTE — Progress Notes (Signed)
Patient ID: Jean Mcclain, female   DOB: 21-Dec-1945, 72 y.o.   MRN: [id number]   Subjective: Jean Mcclain is a 72 y.o. female patient with history of diabetes who presents to office today complaining of long, painful nails  while ambulating in shoes; unable to trim. Patient states that the glucose reading this morning was not recorded and that she has been having a terrible time with her health she hurts from head to toe and has been experiencing more swelling in her lower extremities especially the left leg and has increasing episodes of left heel pain anytime she goes to stand or walk.  Patient also reports that she may be undergoing dialysis and had a port placed in her right arm and states that her kidneys are not doing good currently on 80 mg of Lasix to help with the swelling and is worried that she is not getting any better and is getting less mobile states that she is currently using an electric wheelchair at home and has difficulty with trying to ambulate.  Patient denies any other pedal complaints at this time.  Patient request injection in her left heel at today's visit.  Patient Active Problem List   Diagnosis Date Noted  . Acute respiratory distress   . Acute on chronic diastolic CHF (congestive heart failure) ([place])   . AKI (acute kidney injury) ([place]) 03/30/2017  . Sepsis ([place]) 03/30/2017  . Acute deep vein thrombosis (DVT) of popliteal vein of left lower extremity ([place]) 03/11/2017  . Renal insufficiency 03/11/2017  . Insulin-requiring or dependent type II diabetes mellitus ([place]) 03/11/2017  . Essential hypertension 03/11/2017  . Impingement syndrome of left ankle 01/31/2017  . Posterior tibial tendinitis, left leg 01/31/2017  . Incarcerated ventral hernia 01/02/2013   Current Outpatient Medications on File Prior to Visit  Medication Sig Dispense Refill  . acetaminophen (TYLENOL) 500 MG tablet Take 1,000 mg by mouth 3 (three) times daily as needed for moderate pain or  headache.    Marland Kitchen amLODipine (NORVASC) 10 MG tablet Take 1 tablet (10 mg total) by mouth daily. (Patient not taking: Reported on 05/28/2018) 30 tablet 0  . apixaban (ELIQUIS) 2.5 MG TABS tablet Take 2.5 mg by mouth 2 (two) times daily.    Marland Kitchen apixaban (ELIQUIS) 5 MG TABS tablet Take 2 tablets (10 mg total) by mouth 2 (two) times daily. Then on 03/17/17, take 1 tabet (5 mg) two times daily. (Patient not taking: Reported on 08/14/2018) 69 tablet 0  . calcitRIOL (ROCALTROL) 0.25 MCG capsule Take 0.25 mcg by mouth daily.    . colchicine 0.6 MG tablet Take 0.6 mg by mouth daily as needed (gout flare).     . cyclobenzaprine (FLEXERIL) 10 MG tablet Take 10 mg by mouth at bedtime.     . ergocalciferol (VITAMIN D2) 50000 UNITS capsule Take 50,000 Units by mouth every Friday.     . febuxostat (ULORIC) 40 MG tablet Take 40 mg by mouth daily.    . furosemide (LASIX) 80 MG tablet Take 80 mg by mouth See admin instructions. Take 80 mg by mouth once daily, may take a second 80 mg dose as needed for swelling    . insulin NPH Human (HUMULIN N,NOVOLIN N) 100 UNIT/ML injection Inject 55 Units into the skin 2 (two) times daily.     . insulin regular (NOVOLIN R,HUMULIN R) 100 units/mL injection Inject 10-30 Units into the skin 3 (three) times daily before meals.     . lovastatin (MEVACOR) 40 MG  tablet Take 40 mg by mouth at bedtime.     . metoprolol succinate (TOPROL-XL) 25 MG 24 hr tablet Take 25 mg by mouth daily.     Marland Kitchen omeprazole (PRILOSEC) 20 MG capsule Take 20 mg by mouth daily.    Marland Kitchen oxyCODONE (ROXICODONE) 5 MG immediate release tablet Take 1 tablet (5 mg total) by mouth every 4 (four) hours as needed. 15 tablet 0  . rOPINIRole (REQUIP) 4 MG tablet Take 4 mg by mouth 2 (two) times daily.     . sodium chloride (OCEAN) 0.65 % SOLN nasal spray Place 1 spray into both nostrils as needed for congestion (nose irritation). (Patient taking differently: Place 1 spray into both nostrils as needed for congestion. )  0   Current  Facility-Administered Medications on File Prior to Visit  Medication Dose Route Frequency Provider Last Rate Last Dose  . betamethasone acetate-betamethasone sodium phosphate (CELESTONE) injection 3 mg  3 mg Intramuscular Once Edrick Kins, DPM       No Known Allergies  Recent Results (from the past 2160 hour(s))  I-STAT 4, (NA,K, GLUC, HGB,HCT)     Status: Abnormal   Collection Time: 08/20/18  6:15 AM  Result Value Ref Range   Sodium 140 135 - 145 mmol/L   Potassium 3.1 (L) 3.5 - 5.1 mmol/L   Glucose, Bld 114 (H) 70 - 99 mg/dL   HCT 35.0 (L) 36.0 - 46.0 %   Hemoglobin 11.9 (L) 12.0 - 15.0 g/dL  Glucose, capillary     Status: Abnormal   Collection Time: 08/20/18  9:14 AM  Result Value Ref Range   Glucose-Capillary 103 (H) 70 - 99 mg/dL    Objective: General: Patient is awake, alert, and oriented x 3 and in no acute distress.  Integument: Skin is warm, dry and supple bilateral. Nails are tender, long, thickened and dystrophic with subungual debris, consistent with onychomycosis, 1-5 bilateral. No signs of infection. No open lesions or preulcerative lesions present bilateral.  Compression wrap noted to left lower extremity. Remaining integument unremarkable.  Vasculature:  Dorsalis Pedis pulse 1/4 bilateral. Posterior Tibial pulse  0/4 bilateral. +1pitting edema bilateral with compression sleeve on left Capillary fill time <3 sec 1-5 bilateral. No hair growth to the level of the digits. Temperature gradient within normal limits.   Neurology: The patient has intact sensation measured with a 5.07/10g Semmes Weinstein Monofilament at all pedal sites bilateral . Vibratory sensation diminished bilateral with tuning fork. No Babinski sign present bilateral.   Musculoskeletal: L>R severe pes planus deformity with mild hammertoes noted bilateral.  There is pain with palpation at the medial calcaneal tubercle at the plantar aspect of where the plantar fascia is consistent with plantar  fasciitis on left.  Muscular strength 4/5 in all lower extremity muscular groups. No tenderness with calf compression bilateral.  No signs of DVT.  Assessment and Plan: Problem List Items Addressed This Visit    None    Visit Diagnoses    Pain due to onychomycosis of toenail    -  Primary   Diabetes mellitus due to underlying condition with diabetic polyneuropathy, unspecified whether long term insulin use (HCC)       Plantar fasciitis, left       Relevant Medications   triamcinolone acetonide (KENALOG) 10 MG/ML injection 10 mg (Completed) (Start on 09/11/2018  9:45 PM)   Bursitis of heel, left       Relevant Medications   triamcinolone acetonide (KENALOG) 10 MG/ML injection 10 mg (Completed) (  Start on 09/11/2018  9:45 PM)   Lymphedema          -Examined patient. -Discussed and educated patient on diabetic foot care, especially with  regards to the vascular, neurological and musculoskeletal systems.  -Mechanically debrided all nails 1-5 bilateral using sterile nail nipper and filed with dremel without incident  -Continue with PCP follow up for edema management on left and advised patient if she would like to try therapy for edema control and gait and mobility to call us back in the office because she has a specific therapy location she wants to go to and wants the program and orders to be sent there -After oral consent and aseptic prep, injected a mixture containing 1 ml of 2%  plain lidocaine, 1 ml 0.5% plain marcaine, 0.5 ml of kenalog 10 and 0.5 ml of dexamethasone phosphate into left medial heel without complication. Post-injection care discussed with patient.  If pain recurs patient will require a new x-ray. -Patient to return in 3 months for at risk foot care -Patient advised to call the office if any problems or questions arise in the meantime.  Landis Martins, DPM

## 2018-09-12 ENCOUNTER — Ambulatory Visit (INDEPENDENT_AMBULATORY_CARE_PROVIDER_SITE_OTHER): Payer: PPO | Admitting: Pulmonary Disease

## 2018-09-12 ENCOUNTER — Encounter: Payer: Self-pay | Admitting: Pulmonary Disease

## 2018-09-12 VITALS — BP 128/72 | HR 80 | Ht 60.0 in | Wt 247.0 lb

## 2018-09-12 DIAGNOSIS — Z23 Encounter for immunization: Secondary | ICD-10-CM | POA: Diagnosis not present

## 2018-09-12 DIAGNOSIS — G4733 Obstructive sleep apnea (adult) (pediatric): Secondary | ICD-10-CM

## 2018-09-12 NOTE — Patient Instructions (Signed)
History of obstructive sleep apnea with discontinuation of CPAP use secondary to intolerance to the mask  We will schedule you for a split-night study for obstructive sleep apnea  Evaluation for bariatric surgery by [facility] surgery  I will see you back in the office in about 3 months  Continue efforts at weight loss  Call with significant concerns   Sleep Apnea Sleep apnea is a condition in which breathing pauses or becomes shallow during sleep. Episodes of sleep apnea usually last 10 seconds or longer, and they may occur as many as 20 times an hour. Sleep apnea disrupts your sleep and keeps your body from getting the rest that it needs. This condition can increase your risk of certain health problems, including:  Heart attack.  Stroke.  Obesity.  Diabetes.  Heart failure.  Irregular heartbeat.  There are three kinds of sleep apnea:  Obstructive sleep apnea. This kind is caused by a blocked or collapsed airway.  Central sleep apnea. This kind happens when the part of the brain that controls breathing does not send the correct signals to the muscles that control breathing.  Mixed sleep apnea. This is a combination of obstructive and central sleep apnea.  What are the causes? The most common cause of this condition is a collapsed or blocked airway. An airway can collapse or become blocked if:  Your throat muscles are abnormally relaxed.  Your tongue and tonsils are larger than normal.  You are overweight.  Your airway is smaller than normal.  What increases the risk? This condition is more likely to develop in people who:  Are overweight.  Smoke.  Have a smaller than normal airway.  Are elderly.  Are female.  Drink alcohol.  Take sedatives or tranquilizers.  Have a family history of sleep apnea.  What are the signs or symptoms? Symptoms of this condition include:  Trouble staying asleep.  Daytime sleepiness and  tiredness.  Irritability.  Loud snoring.  Morning headaches.  Trouble concentrating.  Forgetfulness.  Decreased interest in sex.  Unexplained sleepiness.  Mood swings.  Personality changes.  Feelings of depression.  Waking up often during the night to urinate.  Dry mouth.  Sore throat.  How is this diagnosed? This condition may be diagnosed with:  A medical history.  A physical exam.  A series of tests that are done while you are sleeping (sleep study). These tests are usually done in a sleep lab, but they may also be done at home.  How is this treated? Treatment for this condition aims to restore normal breathing and to ease symptoms during sleep. It may involve managing health issues that can affect breathing, such as high blood pressure or obesity. Treatment may include:  Sleeping on your side.  Using a decongestant if you have nasal congestion.  Avoiding the use of depressants, including alcohol, sedatives, and narcotics.  Losing weight if you are overweight.  Making changes to your diet.  Quitting smoking.  Using a device to open your airway while you sleep, such as: ? An oral appliance. This is a custom-made mouthpiece that shifts your lower jaw forward. ? A continuous positive airway pressure (CPAP) device. This device delivers oxygen to your airway through a mask. ? A nasal expiratory positive airway pressure (EPAP) device. This device has valves that you put into each nostril. ? A bi-level positive airway pressure (BPAP) device. This device delivers oxygen to your airway through a mask.  Surgery if other treatments do not work. During  surgery, excess tissue is removed to create a wider airway.  It is important to get treatment for sleep apnea. Without treatment, this condition can lead to:  High blood pressure.  Coronary artery disease.  (Men) An inability to achieve or maintain an erection (impotence).  Reduced thinking  abilities.  Follow these instructions at home:  Make any lifestyle changes that your health care provider recommends.  Eat a healthy, well-balanced diet.  Take over-the-counter and prescription medicines only as told by your health care provider.  Avoid using depressants, including alcohol, sedatives, and narcotics.  Take steps to lose weight if you are overweight.  If you were given a device to open your airway while you sleep, use it only as told by your health care provider.  Do not use any tobacco products, such as cigarettes, chewing tobacco, and e-cigarettes. If you need help quitting, ask your health care provider.  Keep all follow-up visits as told by your health care provider. This is important. Contact a health care provider if:  The device that you received to open your airway during sleep is uncomfortable or does not seem to be working.  Your symptoms do not improve.  Your symptoms get worse. Get help right away if:  You develop chest pain.  You develop shortness of breath.  You develop discomfort in your back, arms, or stomach.  You have trouble speaking.  You have weakness on one side of your body.  You have drooping in your face. These symptoms may represent a serious problem that is an emergency. Do not wait to see if the symptoms will go away. Get medical help right away. Call your local emergency services (911 in the [place] Do not drive yourself to the hospital. This information is not intended to replace advice given to you by your health care provider. Make sure you discuss any questions you have with your health care provider. Document Released: 09/16/2002 Document Revised: 05/22/2016 Document Reviewed: 07/06/2015 Elsevier Interactive Patient Education  [employer].

## 2018-09-12 NOTE — Progress Notes (Signed)
Jean Mcclain    [id number]    1946/01/27  Primary Care Physician:Paterson, Quillian Quince, MD  Referring Physician: Rexene Agent, MD [address] [place], [place] [id number]  Chief complaint:   Patient with a history of obstructive sleep apnea  HPI:  She has been off CPAP for a while now secondary to intolerance of the mask She could not find a mask that fitted  Records does indicate hypercapnic respiratory failure history Acute on chronic diastolic heart failure History of DVT Acute kidney injury  She was diagnosed about 5 years ago, used CPAP for few years Does have a history of snoring, witnessed apneas Usually tries to go to bed about 11 PM Wakes up between 9 and 10 AM  She continues to gain weight,  Status was able to quit smoking without difficulty but finding it difficult to lose weight  She is currently wheelchair-bound She fell a couple years ago and still has significant complications with a foot injury, had multiple rib fractures   Outpatient Encounter Medications as of 09/12/2018  Medication Sig  . acetaminophen (TYLENOL) 500 MG tablet Take 1,000 mg by mouth 3 (three) times daily as needed for moderate pain or headache.  Marland Kitchen apixaban (ELIQUIS) 2.5 MG TABS tablet Take 2.5 mg by mouth 2 (two) times daily.  Marland Kitchen apixaban (ELIQUIS) 5 MG TABS tablet Take 2 tablets (10 mg total) by mouth 2 (two) times daily. Then on 03/17/17, take 1 tabet (5 mg) two times daily.  . calcitRIOL (ROCALTROL) 0.25 MCG capsule Take 0.25 mcg by mouth daily.  . colchicine 0.6 MG tablet Take 0.6 mg by mouth daily as needed (gout flare).   . cyclobenzaprine (FLEXERIL) 10 MG tablet Take 10 mg by mouth at bedtime.   . ergocalciferol (VITAMIN D2) 50000 UNITS capsule Take 50,000 Units by mouth every Friday.   . febuxostat (ULORIC) 40 MG tablet Take 40 mg by mouth daily.  . furosemide (LASIX) 80 MG tablet Take 80 mg by mouth See admin instructions. Take 80 mg by mouth once daily, may take a second  80 mg dose as needed for swelling  . insulin NPH Human (HUMULIN N,NOVOLIN N) 100 UNIT/ML injection Inject 55 Units into the skin 2 (two) times daily.   . insulin regular (NOVOLIN R,HUMULIN R) 100 units/mL injection Inject 10-30 Units into the skin 3 (three) times daily before meals.   . lovastatin (MEVACOR) 40 MG tablet Take 40 mg by mouth at bedtime.   . metoprolol succinate (TOPROL-XL) 25 MG 24 hr tablet Take 25 mg by mouth daily.   Marland Kitchen omeprazole (PRILOSEC) 20 MG capsule Take 20 mg by mouth daily.  Marland Kitchen oxyCODONE (ROXICODONE) 5 MG immediate release tablet Take 1 tablet (5 mg total) by mouth every 4 (four) hours as needed.  Marland Kitchen rOPINIRole (REQUIP) 4 MG tablet Take 4 mg by mouth 2 (two) times daily.   . [DISCONTINUED] amLODipine (NORVASC) 10 MG tablet Take 1 tablet (10 mg total) by mouth daily.  . [DISCONTINUED] sodium chloride (OCEAN) 0.65 % SOLN nasal spray Place 1 spray into both nostrils as needed for congestion (nose irritation). (Patient taking differently: Place 1 spray into both nostrils as needed for congestion. )   Facility-Administered Encounter Medications as of 09/12/2018  Medication  . betamethasone acetate-betamethasone sodium phosphate (CELESTONE) injection 3 mg    Allergies as of 09/12/2018  . (No Known Allergies)    Past Medical History:  Diagnosis Date  . Anxiety   .  Arthritis    osteo  . Chronic kidney disease   . Diabetes mellitus, type 2 (HCC)    Type II  . DVT (deep venous thrombosis) ([place]) 03/2017   left popliteal  . Facet joint disease   . Facet joint disease    foot  . Fibula fracture 02/2017   left  . GERD (gastroesophageal reflux disease)   . Hyperlipidemia   . Hypertension   . OSA (obstructive sleep apnea)    does not use CPAP   . Osteopenia   . Shortness of breath    with exertion  . Venous stasis ulcers ([place])   . Vitamin D deficiency     Past Surgical History:  Procedure Laterality Date  . AV FISTULA PLACEMENT Right 08/20/2018   Procedure:  ARTERIOVENOUS (AV) FISTULA CREATION BRACHIOCEPHALIC;  Surgeon: Angelia Mould, MD;  Location: [facility];  Service: Vascular;  Laterality: Right;  . BREAST SURGERY Right 03/1997   biospy  . CARPAL TUNNEL RELEASE Left 2016  . INSERTION OF MESH N/A 01/08/2013   Procedure: INSERTION OF MESH;  Surgeon: Earnstine Regal, MD;  Location: [facility];  Service: General;  Laterality: N/A;  . IR THORACENTESIS ASP PLEURAL SPACE W/IMG GUIDE  04/06/2017  . JOINT REPLACEMENT Right 1999  . TOTAL KNEE ARTHROPLASTY Left 1999   Left  . TRIGGER FINGER RELEASE Left 11/2014  . TRIGGER FINGER RELEASE Right   . VENTRAL HERNIA REPAIR N/A 01/08/2013   Procedure: LAPAROSCOPIC VENTRAL HERNIA;  Surgeon: Earnstine Regal, MD;  Location: [facility];  Service: General;  Laterality: N/A;    Family History  Problem Relation Age of Onset  . Leukemia Mother   . Hypertension Brother   . Emphysema Father     Social History   Socioeconomic History  . Marital status: Divorced    Spouse name: Not on file  . Number of children: Not on file  . Years of education: Not on file  . Highest education level: Not on file  Occupational History  . Not on file  Social Needs  . Financial resource strain: Not on file  . Food insecurity:    Worry: Not on file    Inability: Not on file  . Transportation needs:    Medical: Not on file    Non-medical: Not on file  Tobacco Use  . Smoking status: Former Smoker    Years: 27.00    Types: Cigarettes    Last attempt to quit: 10/10/1981    Years since quitting: 36.9  . Smokeless tobacco: Never Used  Substance and Sexual Activity  . Alcohol use: No  . Drug use: No  . Sexual activity: Not on file  Lifestyle  . Physical activity:    Days per week: Not on file    Minutes per session: Not on file  . Stress: Not on file  Relationships  . Social connections:    Talks on phone: Not on file    Gets together: Not on file    Attends religious service: Not on file    Active member of club or  organization: Not on file    Attends meetings of clubs or organizations: Not on file    Relationship status: Not on file  . Intimate partner violence:    Fear of current or ex partner: Not on file    Emotionally abused: Not on file    Physically abused: Not on file    Forced sexual activity: Not on file  Other Topics  Concern  . Not on file  Social History Narrative  . Not on file    Review of Systems  Constitutional: Negative.   HENT: Negative.   Eyes: Negative.   Respiratory: Positive for apnea.   Cardiovascular: Negative.   Gastrointestinal: Negative.   Psychiatric/Behavioral: Positive for sleep disturbance.  All other systems reviewed and are negative.   Vitals:   09/12/18 1555  BP: 128/72  Pulse: 80  SpO2: 97%     Physical Exam  Constitutional: She appears well-developed and well-nourished.  HENT:  Head: Normocephalic and atraumatic.  Mallampati 3  Eyes: Pupils are equal, round, and reactive to light. Conjunctivae are normal. Right eye exhibits no discharge. Left eye exhibits no discharge.  Neck: Normal range of motion. Neck supple. No tracheal deviation present. No thyromegaly present.  Cardiovascular: Normal rate and regular rhythm.  Pulmonary/Chest: Effort normal and breath sounds normal. No respiratory distress. She has no wheezes.  Abdominal: Soft. Bowel sounds are normal. She exhibits no distension.   Data Reviewed: Epworth Sleepiness Scale of 23  Assessment:  History of obstructive sleep apnea  Morbid obesity  Possible obesity hypoventilation syndrome  Plan/Recommendations: We will schedule a split-night study  We will refer for bariatric surgery evaluation-schedule for evaluation with [facility] surgery  Pathophysiology of sleep disordered breathing discussed Treatment options for sleep disordered breathing discussed  Importance of weight loss and increase activity discussed  I will see her back in the office in about 3  months Encouraged to call with any significant concerns     Sherrilyn Rist MD [facility] 09/12/2018, 4:20 PM  CC: Rexene Agent, MD

## 2018-09-13 MED ORDER — PNEUMOCOCCAL 13-VAL CONJ VACC IM SUSP: 0.5000 mL | INTRAMUSCULAR | Status: DC

## 2018-09-13 NOTE — Addendum Note (Signed)
Addended by: Madolyn Frieze on: 09/13/2018 09:37 AM   Modules accepted: Orders

## 2018-09-14 ENCOUNTER — Telehealth: Payer: Self-pay | Admitting: Sports Medicine

## 2018-09-14 DIAGNOSIS — I890 Lymphedema, not elsewhere classified: Secondary | ICD-10-CM

## 2018-09-14 NOTE — Telephone Encounter (Signed)
Patient is requesting physical therapy from Doctor for her Lymphedema. Please give pt a call.

## 2018-09-18 NOTE — Telephone Encounter (Signed)
I called pt and informed that Dr. Cannon Kettle referred to PT for Lymphedema therapy and that our office referred to [facility]. Pt states her friend went to a different group and she would like to go there. I told pt to call me with the information and I would make the referral.

## 2018-09-20 DIAGNOSIS — G4733 Obstructive sleep apnea (adult) (pediatric): Secondary | ICD-10-CM | POA: Diagnosis not present

## 2018-09-20 DIAGNOSIS — N189 Chronic kidney disease, unspecified: Secondary | ICD-10-CM | POA: Diagnosis not present

## 2018-09-20 DIAGNOSIS — I10 Essential (primary) hypertension: Secondary | ICD-10-CM | POA: Diagnosis not present

## 2018-09-20 DIAGNOSIS — E1165 Type 2 diabetes mellitus with hyperglycemia: Secondary | ICD-10-CM | POA: Diagnosis not present

## 2018-09-20 DIAGNOSIS — L0390 Cellulitis, unspecified: Secondary | ICD-10-CM | POA: Diagnosis not present

## 2018-09-20 DIAGNOSIS — E7800 Pure hypercholesterolemia, unspecified: Secondary | ICD-10-CM | POA: Diagnosis not present

## 2018-09-21 NOTE — Addendum Note (Signed)
Addended by: Harriett Sine D on: 09/21/2018 11:01 AM   Modules accepted: Orders

## 2018-09-21 NOTE — Telephone Encounter (Signed)
Faxed referral to [facility] [place].

## 2018-09-22 ENCOUNTER — Other Ambulatory Visit: Payer: Self-pay

## 2018-09-22 ENCOUNTER — Encounter (HOSPITAL_COMMUNITY): Payer: Self-pay | Admitting: Emergency Medicine

## 2018-09-22 ENCOUNTER — Ambulatory Visit (HOSPITAL_COMMUNITY)
Admission: EM | Admit: 2018-09-22 | Discharge: 2018-09-22 | Disposition: A | Discharge status: Home / Self Care | Payer: PPO | Attending: Family Medicine | Admitting: Family Medicine

## 2018-09-22 DIAGNOSIS — R600 Localized edema: Secondary | ICD-10-CM | POA: Diagnosis not present

## 2018-09-22 DIAGNOSIS — N184 Chronic kidney disease, stage 4 (severe): Secondary | ICD-10-CM

## 2018-09-22 LAB — POCT I-STAT, CHEM 8
BUN: 47 mg/dL — ABNORMAL HIGH (ref 8–23)
Calcium, Ion: 1.04 mmol/L — ABNORMAL LOW (ref 1.15–1.40)
Chloride: 99 mmol/L (ref 98–111)
Creatinine, Ser: 3.2 mg/dL — ABNORMAL HIGH (ref 0.44–1.00)
Glucose, Bld: 121 mg/dL — ABNORMAL HIGH (ref 70–99)
HCT: 37 % (ref 36.0–46.0)
Hemoglobin: 12.6 g/dL (ref 12.0–15.0)
Potassium: 3.6 mmol/L (ref 3.5–5.1)
Sodium: 141 mmol/L (ref 135–145)
TCO2: 34 mmol/L — ABNORMAL HIGH (ref 22–32)

## 2018-09-22 NOTE — ED Provider Notes (Signed)
Patient: Jean Mcclain MRN: [id number] DOB: 05/22/1946 PCP: Leanna Battles, MD     Subjective:  Chief Complaint  Patient presents with  . weeping    HPI: The patient is a 72 y.o. female who presents today for right leg weeping. She has a past medical hx of CKD, DM2, HLD, HTN, OSA, venous stasis ulcers, HLD and DVT on eliquis. She presents with right leg weeping and a big blister on the lateral side of her right lower leg. It has been going on x 1 day. She normally does not have significant leg swelling in her right leg, so this is new. She went to her doctor on Thursday AM and saw a mid level. She was told to increase her lasix and started on an antibiotic, but she can not recall name of this drug. She was seen for her left leg, not her right leg. Her left leg was oozing and she was also going to send home health care to help with dressing. She is unsure if her left leg is any better. She has been having chills, no fever. She never increased her lasix. She denies any shortness of breath, chest pain, vision changes. She is still making urine.   They just put a right brachiocephalic AV fistula in November. She is unsure when dialysis starts.   Review of Systems  Constitutional: Negative for chills, diaphoresis, fatigue and fever.  Respiratory: Negative for cough, shortness of breath and wheezing.   Cardiovascular: Positive for leg swelling. Negative for chest pain and palpitations.  Gastrointestinal: Negative for abdominal pain, diarrhea, nausea and vomiting.  Genitourinary: Negative for difficulty urinating and dysuria.  Skin: Positive for wound.       Blister on right lateral leg     Allergies Patient has No Known Allergies.  Past Medical History Patient  has a past medical history of Anxiety, Arthritis, Chronic kidney disease, Diabetes mellitus, type 2 ([place]), DVT (deep venous thrombosis) ([place]) (03/2017), Facet joint disease, Facet joint disease, Fibula fracture (02/2017), GERD  (gastroesophageal reflux disease), Hyperlipidemia, Hypertension, OSA (obstructive sleep apnea), Osteopenia, Shortness of breath, Venous stasis ulcers ([place]), and Vitamin D deficiency.  Surgical History Patient  has a past surgical history that includes Total knee arthroplasty (Left, 1999); Ventral hernia repair (N/A, 01/08/2013); Insertion of mesh (N/A, 01/08/2013); IR THORACENTESIS ASP PLEURAL SPACE W/IMG GUIDE (04/06/2017); Joint replacement (Right, 1999); Breast surgery (Right, 03/1997); Carpal tunnel release (Left, 2016); Trigger finger release (Left, 11/2014); Trigger finger release (Right); and AV fistula placement (Right, 08/20/2018).  Family History Pateint's family history includes Emphysema in her father; Hypertension in her brother; Leukemia in her mother.  Social History Patient  reports that she quit smoking about 36 years ago. Her smoking use included cigarettes. She quit after 27.00 years of use. She has never used smokeless tobacco. She reports that she does not drink alcohol or use drugs.    Objective: Vitals:   09/22/18 1531  BP: (!) 148/77  Pulse: 84  Resp: 20  Temp: 98.7 F (37.1 C)  TempSrc: Oral  SpO2: 94%    There is no height or weight on file to calculate BMI.  Physical Exam Vitals signs reviewed.  Constitutional:      General: She is not in acute distress.    Appearance: She is obese. She is not ill-appearing.  Neck:     Musculoskeletal: Normal range of motion and neck supple.  Cardiovascular:     Rate and Rhythm: Normal rate and regular rhythm.  Heart sounds: Normal heart sounds.     Comments: She has bilateral LE edema with stasis dermatitis. Her left leg has erythema, more chronic looking with a small area that was opened and is healing with no drainage.  Her right leg has edema to calf with very large vesicle on lateral side of right leg above lateral malleolus. Fluid filled. Minimal erythema with dry, flaking skin.  Calf is not red and no swelling.  Negative homan sign.  Pulmonary:     Effort: Pulmonary effort is normal.     Breath sounds: Normal breath sounds. No wheezing or rales.  Abdominal:     General: Bowel sounds are normal.     Palpations: Abdomen is soft.  Neurological:     General: No focal deficit present.     Mental Status: She is oriented to person, place, and time.    istat done: creatinine: 3.20/ BUN: 47. electrolyes normal except mild decreased calcium. Calculated GFR to be 17 with Crockcroft-Gault.  Per vascular surgeon note last GFR was around 13. Have no other labs to see. H&H normal      Assessment/plan: Chronic kidney disease (CKD), stage IV (severe) (HCC) See below  Bilateral lower extremity edema -discussed not much I can do today. She has stage 4 CKD with fistula placed for dialysis. Does not appear to be worsening renal function with stable kidney function and normal electrolytes. I want her to just take an extra 40mg  of lasix today and tomorrow and then call her renal doc on Monday. She also will benefit from possible wrap/unna boots for her vesicles/weeping. Discussed I do not want her popping this and when it does she can wash with soap and water. Infection precautions given. She is to call PCP as they supposedly ordered home health wound care this week. Strict ER precautions given. Continue abx as prescribed.      Orma Flaming, MD  09/22/2018    Orma Flaming, MD 09/22/18 1904

## 2018-09-22 NOTE — Discharge Instructions (Signed)
Call PCP and get wound care out next week  Call renal doc and let him know swelling worse.   Increase lasix to 1.5 pills, discuss with renal doc on Monday

## 2018-09-22 NOTE — ED Triage Notes (Signed)
The patient presented to the [facility] with a complaint of her right leg weeping fluid x 3 days. The patient stated that she was seen by her PCP and was advised to increase her diuretic. The patient has not been comliant with that order. The patient was also placed on a po antibiotic that she does not know the name of.

## 2018-09-24 ENCOUNTER — Telehealth: Payer: Self-pay

## 2018-09-24 NOTE — Telephone Encounter (Signed)
Pt. Called asking if we can help her make contact with home health which she states was ordered by her PCP. I encouraged her to call them back. She is s/p AVF on 11/11 and stated that her hand is a little "puffy" and is painful but has no pain when she grips. She has not elevated it, so I encouraged her to elevate arm and to squeeze a warm wash cloth in fist. She has a f/u appt. This Wednesday. I told her to call us back if anything changes/worsens. Pt. Verbalized understanding and has no further questions at this time.

## 2018-09-26 ENCOUNTER — Other Ambulatory Visit: Payer: Self-pay

## 2018-09-26 ENCOUNTER — Ambulatory Visit (HOSPITAL_COMMUNITY)
Admission: RE | Admit: 2018-09-26 | Discharge: 2018-09-26 | Disposition: A | Discharge status: Home / Self Care | Payer: PPO | Source: Ambulatory Visit | Attending: Vascular Surgery | Admitting: Vascular Surgery

## 2018-09-26 ENCOUNTER — Ambulatory Visit (INDEPENDENT_AMBULATORY_CARE_PROVIDER_SITE_OTHER): Payer: Self-pay | Admitting: Vascular Surgery

## 2018-09-26 ENCOUNTER — Encounter: Payer: Self-pay | Admitting: Vascular Surgery

## 2018-09-26 VITALS — BP 131/72 | HR 80 | Temp 97.3°F | Resp 20 | Ht 60.0 in | Wt 247.0 lb

## 2018-09-26 DIAGNOSIS — N184 Chronic kidney disease, stage 4 (severe): Secondary | ICD-10-CM | POA: Diagnosis present

## 2018-09-26 DIAGNOSIS — N185 Chronic kidney disease, stage 5: Secondary | ICD-10-CM

## 2018-09-26 DIAGNOSIS — Z48812 Encounter for surgical aftercare following surgery on the circulatory system: Secondary | ICD-10-CM

## 2018-09-26 NOTE — Progress Notes (Signed)
Patient name: Jean Mcclain MRN: [id number] DOB: [birth year] Sex: female  REASON FOR VISIT:   Follow-up after right brachiocephalic AV fistula.  HPI:   Jean Mcclain is a pleasant 72 y.o. female with stage IV chronic kidney disease.  She has not yet on dialysis.  She had a right brachiocephalic fistula with superficialization on 08/20/2018.  Comes in for a follow-up visit.  She does occasionally get some paresthesias in her fingertips on the right hand but this is intermittent.  She denies significant pain.  Current Outpatient Medications  Medication Sig Dispense Refill  . acetaminophen (TYLENOL) 500 MG tablet Take 1,000 mg by mouth 3 (three) times daily as needed for moderate pain or headache.    Marland Kitchen apixaban (ELIQUIS) 2.5 MG TABS tablet Take 2.5 mg by mouth 2 (two) times daily.    Marland Kitchen apixaban (ELIQUIS) 5 MG TABS tablet Take 2 tablets (10 mg total) by mouth 2 (two) times daily. Then on 03/17/17, take 1 tabet (5 mg) two times daily. 69 tablet 0  . calcitRIOL (ROCALTROL) 0.25 MCG capsule Take 0.25 mcg by mouth daily.    . colchicine 0.6 MG tablet Take 0.6 mg by mouth daily as needed (gout flare).     . cyclobenzaprine (FLEXERIL) 10 MG tablet Take 10 mg by mouth at bedtime.     . ergocalciferol (VITAMIN D2) 50000 UNITS capsule Take 50,000 Units by mouth every Friday.     . febuxostat (ULORIC) 40 MG tablet Take 40 mg by mouth daily.    . furosemide (LASIX) 80 MG tablet Take 80 mg by mouth See admin instructions. Take 80 mg by mouth once daily, may take a second 80 mg dose as needed for swelling    . insulin NPH Human (HUMULIN N,NOVOLIN N) 100 UNIT/ML injection Inject 55 Units into the skin 2 (two) times daily.     . insulin regular (NOVOLIN R,HUMULIN R) 100 units/mL injection Inject 10-30 Units into the skin 3 (three) times daily before meals.     . lovastatin (MEVACOR) 40 MG tablet Take 40 mg by mouth at bedtime.     . metoprolol succinate (TOPROL-XL) 25 MG 24 hr tablet Take 25 mg by mouth  daily.     Marland Kitchen omeprazole (PRILOSEC) 20 MG capsule Take 20 mg by mouth daily.    Marland Kitchen oxyCODONE (ROXICODONE) 5 MG immediate release tablet Take 1 tablet (5 mg total) by mouth every 4 (four) hours as needed. 15 tablet 0  . rOPINIRole (REQUIP) 4 MG tablet Take 4 mg by mouth 2 (two) times daily.      Current Facility-Administered Medications  Medication Dose Route Frequency Provider Last Rate Last Dose  . betamethasone acetate-betamethasone sodium phosphate (CELESTONE) injection 3 mg  3 mg Intramuscular Once Edrick Kins, DPM        REVIEW OF SYSTEMS:  [X]  denotes positive finding, [ ]  denotes negative finding Vascular    Leg swelling    Cardiac    Chest pain or chest pressure:    Shortness of breath upon exertion:    Short of breath when lying flat:    Irregular heart rhythm:    Constitutional    Fever or chills:     PHYSICAL EXAM:   Vitals:   09/26/18 1151  BP: 131/72  Pulse: 80  Resp: 20  Temp: (!) 97.3 F (36.3 C)  SpO2: 97%  Weight: 247 lb (112 kg)  Height: 5' (1.524 m)    GENERAL: The patient is a well-nourished  female, in no acute distress. The vital signs are documented above. CARDIOVASCULAR: There is a regular rate and rhythm. PULMONARY: There is good air exchange bilaterally without wheezing or rales. VASCULAR: She has an excellent thrill in her right upper arm fistula.  Her incisions are healing nicely.  She has a palpable right radial pulse.  DATA:   DUPLEX AV FISTULA: The fistula appears to be touring nicely.  Diameters ranged from 0.52-0.59 cm.  The vein is somewhat deep at 1.6 cm.  MEDICAL ISSUES:   STAGE IV CHRONIC KIDNEY DISEASE: The patient is doing well status post placement of a right upper arm fistula.  This appears to be maturing adequately.  She has some occasional tingling in her fingers.  If this worsens then she knows to call as she could be developing some mild steal symptoms.  I have encouraged her to continue to exercise her arm.  We will see her  back as needed.  Deitra Mayo [facility] [phone]

## 2018-09-27 ENCOUNTER — Other Ambulatory Visit: Payer: Self-pay

## 2018-09-27 NOTE — Patient Outreach (Signed)
[place] [facility]) Care Management  09/27/2018  YEILY LINK 1946/07/11 [id number]    1st unsuccessful attempt to outreach the patient for assessment.  No answer.  Phone rang several times with no answering machine pick up.  Plan: [place] will make outreach attempt the patient within thirty business days.  Lazaro Arms RN, BSN, [place] Direct Dial:  [phone]  Fax: [phone]

## 2018-09-28 DIAGNOSIS — E1151 Type 2 diabetes mellitus with diabetic peripheral angiopathy without gangrene: Secondary | ICD-10-CM | POA: Diagnosis not present

## 2018-09-28 DIAGNOSIS — L97511 Non-pressure chronic ulcer of other part of right foot limited to breakdown of skin: Secondary | ICD-10-CM | POA: Diagnosis not present

## 2018-09-28 DIAGNOSIS — Z794 Long term (current) use of insulin: Secondary | ICD-10-CM | POA: Diagnosis not present

## 2018-09-28 DIAGNOSIS — L03116 Cellulitis of left lower limb: Secondary | ICD-10-CM | POA: Diagnosis not present

## 2018-09-28 DIAGNOSIS — G4733 Obstructive sleep apnea (adult) (pediatric): Secondary | ICD-10-CM | POA: Diagnosis not present

## 2018-09-28 DIAGNOSIS — I129 Hypertensive chronic kidney disease with stage 1 through stage 4 chronic kidney disease, or unspecified chronic kidney disease: Secondary | ICD-10-CM | POA: Diagnosis not present

## 2018-09-28 DIAGNOSIS — L97821 Non-pressure chronic ulcer of other part of left lower leg limited to breakdown of skin: Secondary | ICD-10-CM | POA: Diagnosis not present

## 2018-09-28 DIAGNOSIS — Z992 Dependence on renal dialysis: Secondary | ICD-10-CM | POA: Diagnosis not present

## 2018-09-28 DIAGNOSIS — Z9181 History of falling: Secondary | ICD-10-CM | POA: Diagnosis not present

## 2018-09-28 DIAGNOSIS — L03115 Cellulitis of right lower limb: Secondary | ICD-10-CM | POA: Diagnosis not present

## 2018-09-28 DIAGNOSIS — I87311 Chronic venous hypertension (idiopathic) with ulcer of right lower extremity: Secondary | ICD-10-CM | POA: Diagnosis not present

## 2018-09-28 DIAGNOSIS — M109 Gout, unspecified: Secondary | ICD-10-CM | POA: Diagnosis not present

## 2018-09-28 DIAGNOSIS — E1122 Type 2 diabetes mellitus with diabetic chronic kidney disease: Secondary | ICD-10-CM | POA: Diagnosis not present

## 2018-09-28 DIAGNOSIS — Z87891 Personal history of nicotine dependence: Secondary | ICD-10-CM | POA: Diagnosis not present

## 2018-09-28 DIAGNOSIS — E559 Vitamin D deficiency, unspecified: Secondary | ICD-10-CM | POA: Diagnosis not present

## 2018-09-28 DIAGNOSIS — Z7901 Long term (current) use of anticoagulants: Secondary | ICD-10-CM | POA: Diagnosis not present

## 2018-09-28 DIAGNOSIS — E1140 Type 2 diabetes mellitus with diabetic neuropathy, unspecified: Secondary | ICD-10-CM | POA: Diagnosis not present

## 2018-09-28 DIAGNOSIS — N184 Chronic kidney disease, stage 4 (severe): Secondary | ICD-10-CM | POA: Diagnosis not present

## 2018-09-28 DIAGNOSIS — I87312 Chronic venous hypertension (idiopathic) with ulcer of left lower extremity: Secondary | ICD-10-CM | POA: Diagnosis not present

## 2018-09-28 DIAGNOSIS — G2581 Restless legs syndrome: Secondary | ICD-10-CM | POA: Diagnosis not present

## 2018-09-28 DIAGNOSIS — F329 Major depressive disorder, single episode, unspecified: Secondary | ICD-10-CM | POA: Diagnosis not present

## 2018-09-28 DIAGNOSIS — F3289 Other specified depressive episodes: Secondary | ICD-10-CM | POA: Diagnosis not present

## 2018-10-04 DIAGNOSIS — R2681 Unsteadiness on feet: Secondary | ICD-10-CM | POA: Diagnosis not present

## 2018-10-04 DIAGNOSIS — M6281 Muscle weakness (generalized): Secondary | ICD-10-CM | POA: Diagnosis not present

## 2018-10-04 DIAGNOSIS — E1140 Type 2 diabetes mellitus with diabetic neuropathy, unspecified: Secondary | ICD-10-CM | POA: Diagnosis not present

## 2018-10-04 DIAGNOSIS — S2242XD Multiple fractures of ribs, left side, subsequent encounter for fracture with routine healing: Secondary | ICD-10-CM | POA: Diagnosis not present

## 2018-10-04 DIAGNOSIS — R2689 Other abnormalities of gait and mobility: Secondary | ICD-10-CM | POA: Diagnosis not present

## 2018-10-04 DIAGNOSIS — M1990 Unspecified osteoarthritis, unspecified site: Secondary | ICD-10-CM | POA: Diagnosis not present

## 2018-10-04 DIAGNOSIS — A419 Sepsis, unspecified organism: Secondary | ICD-10-CM | POA: Diagnosis not present

## 2018-10-04 DIAGNOSIS — S31109A Unspecified open wound of abdominal wall, unspecified quadrant without penetration into peritoneal cavity, initial encounter: Secondary | ICD-10-CM | POA: Diagnosis not present

## 2018-10-04 DIAGNOSIS — J90 Pleural effusion, not elsewhere classified: Secondary | ICD-10-CM | POA: Diagnosis not present

## 2018-10-04 DIAGNOSIS — R0603 Acute respiratory distress: Secondary | ICD-10-CM | POA: Diagnosis not present

## 2018-10-04 DIAGNOSIS — M1631 Unilateral osteoarthritis resulting from hip dysplasia, right hip: Secondary | ICD-10-CM | POA: Diagnosis not present

## 2018-10-04 DIAGNOSIS — Z9181 History of falling: Secondary | ICD-10-CM | POA: Diagnosis not present

## 2018-10-12 DIAGNOSIS — N184 Chronic kidney disease, stage 4 (severe): Secondary | ICD-10-CM | POA: Diagnosis not present

## 2018-10-12 DIAGNOSIS — N2581 Secondary hyperparathyroidism of renal origin: Secondary | ICD-10-CM | POA: Diagnosis not present

## 2018-10-16 DIAGNOSIS — N184 Chronic kidney disease, stage 4 (severe): Secondary | ICD-10-CM | POA: Diagnosis not present

## 2018-10-16 DIAGNOSIS — N2581 Secondary hyperparathyroidism of renal origin: Secondary | ICD-10-CM | POA: Diagnosis not present

## 2018-10-16 DIAGNOSIS — I129 Hypertensive chronic kidney disease with stage 1 through stage 4 chronic kidney disease, or unspecified chronic kidney disease: Secondary | ICD-10-CM | POA: Diagnosis not present

## 2018-10-16 DIAGNOSIS — E1122 Type 2 diabetes mellitus with diabetic chronic kidney disease: Secondary | ICD-10-CM | POA: Diagnosis not present

## 2018-10-24 ENCOUNTER — Other Ambulatory Visit: Payer: Self-pay

## 2018-10-24 NOTE — Patient Outreach (Signed)
[place] [facility]) Care Management  10/24/2018  AMBYR QADRI [birth year] [id number]    2nd  attempt to outreach the patient for assessment No answer.  HIPAA compliant voicemail left with contact information.  Plan:  RN Health Coach will send letter.  RN Health Coach will make another outreach attempt to the patient within thirty days.  Lazaro Arms RN, BSN, [place] Direct Dial:  [phone]  Fax: [phone]   '

## 2018-10-29 ENCOUNTER — Ambulatory Visit (HOSPITAL_BASED_OUTPATIENT_CLINIC_OR_DEPARTMENT_OTHER): Payer: PPO | Attending: Pulmonary Disease | Admitting: Pulmonary Disease

## 2018-10-29 DIAGNOSIS — G4733 Obstructive sleep apnea (adult) (pediatric): Secondary | ICD-10-CM

## 2018-11-01 ENCOUNTER — Telehealth: Payer: Self-pay | Admitting: Pulmonary Disease

## 2018-11-01 DIAGNOSIS — G4733 Obstructive sleep apnea (adult) (pediatric): Secondary | ICD-10-CM

## 2018-11-01 NOTE — Telephone Encounter (Signed)
Sleep study result  Date of study 10/29/2018  Study positive for severe obstructive sleep apnea  Recommendation:  Trial of CPAP therapy on 20 cm H2O with a Small size Fisher&Paykel Full Face Mask Simplus mask and heated humidification. - Hypoxemia corrected with 2L oxygen piped in to the system  Follow-up in the office as scheduled

## 2018-11-01 NOTE — Procedures (Signed)
POLYSOMNOGRAPHY  Last, First: [name], [name] MRN: [id number] Gender: Female Age (years): 73 Weight (lbs): 247 DOB: 01-Aug-1946 BMI: 48 Primary Care: No PCP Epworth Score: 22 Referring: Laurin Coder MD Technician: Rebekah Chesterfield Interpreting: Laurin Coder MD Study Type: Split Night CPAP Ordered Study Type: Split Night CPAP Study date: 10/29/2018 Location: [place] CLINICAL INFORMATION Jean Mcclain is a 73 year old Female and was referred to the sleep center for evaluation of G47.33 OSA: Adult and Pediatric (327.23). Indications include Congestive Heart Failure, Diabetes, Excessive Daytime Sleepiness, Fatigue, Hypertension, Obesity, OSA.  MEDICATIONS Patient self administered medications include: CYCLOBENZAPRINE HCL, LOVASTATIN, ROPINIROLE HCL, ELIQUIS. Medications administered during study include No sleep medicine administered.  SLEEP STUDY TECHNIQUE The patient underwent an attended overnight level one polysomnography titration to assess the effects of CPAP therapy. The following variables were monitored: EEG (C4-A1, C3-A2, O1-A2, O2-A1), EOG, submental and leg EMG, ECG, oxyhemoglobin saturation by pulse oximetry, thoracic and abdominal respiratory effort belts, nasal/oral airflow by pressure sensor, body position sensor and snoring sensor. CPAP pressure was titrated to eliminate apneas, hypopneas and oxygen desaturation. Hypopneas were scored per AASM definition IB (4% desaturation)  The NPSG portion of the study ended at 2:22:56 AM . The CPAP titration was initiated at 2:27:54 AM AM with the CPAP portion of the study ending at 6:01:54 AM.  TECHNICIAN COMMENTS Comments added by Technician: Patient sleep in a recliner chair. O2 initiated due to low sats. Patient talked in his/her sleep. Comments added by Scorer: N/A SLEEP ARCHITECTURE The recording time for the entire night was 364 minutes. The diagnostic portion was initiated at 11:57:55 PM and terminated at 2:22:56  AM. The time in bed was 145.0 minutes. EEG confirmed total sleep time was 139.5 minutes yielding a sleep efficiency of 96.2%%. Sleep onset after lights out was 1.5 minutes with a REM latency of 76.0 minutes. The patient spent 19.3%% of the night in stage N1 sleep, 75.3%% in stage N2 sleep, 0.0%% in stage N3 and 5.4% in REM. The Arousal Index was 52.9/hour.  The titration portion was initiated at 2:27:54 AM and terminated at 6:01:54 AM. The time in bed was 214.0 minutes. EEG confirmed total sleep time was 203.5 minutes yielding a sleep efficiency of 95.1%%. Sleep onset after CPAP initiation was 0.5 minutes with a REM latency of 12.5 minutes. The patient spent 3.2%% of the night in stage N1 sleep, 63.4%% in stage N2 sleep, 7.9%% in stage N3 and 25.6% in REM. The Arousal Index was 15.3/hour. RESPIRATORY PARAMETERS During the diagnostic portion, there were a total of 179 respiratory disturbances recorded; 5 apneas ( 3 obstructive, 0 mixed, 2 central), 149 hypopneas and 25 RERAs. The apnea/hypopnea index 66.2 was events/hour and the RDI was 77.0 events/hour. The central sleep apnea index was 0.9 events/hour. The REM AHI was 80.0 /h and NREM AHI was 65.4/h. The REM RDI was 80.0 /h and NREM RDI was 76.8 /h. The supine AHI was N/A/h, and the non supine AHI was 66.2/h; supine during 0.0%% of sleep. The supine RDI was 0.0/h, and the non supine RDI was 76.97/h. Respiratory disturbances were associated with oxygen desaturation down to a nadir of 73.0 % during sleep. The mean oxygen saturation during the study was 82.7%. The cumulative time under 88% oxygen saturation was 129.6 minutes.  During the titration portion, the apnea/hypopnea index (AHI) was 28.0 events/hour and the RDI was 28.0 events/hour. The central sleep apnea index was events/hour. The most appropriate setting of CPAP was IPAP/EPAP 20/20 cm H2O. At  this setting, the sleep efficiency was 82% and the patient was supine for 0%. The AHI was 28.8 events per  hour(with 1 central events). Oxygen nadir was 83.0. LEG MOVEMENT DATA The periodic limb movement index was 0.0/hour with an associated arousal index of /hour. CARDIAC DATA The underlying cardiac rhythm was most consistent with atrial fibrillation. Mean heart rate was 76.1 during diagnostic portion and 79.2 during titration portion of study. Additional rhythm abnormalities include None.   IMPRESSIONS - Severe Obstructive Sleep apnea(OSA) Optimal pressure attained. - EKG showed no cardiac abnormalities. - No Significant Central Sleep Apnea (CSA) - Severe Oxygen Desaturation - The patient snored with moderate snoring volume. - EEG did not show alpha intrusion. - No significant periodic leg movements(PLMs) during sleep. No significant associated arousals. - Normal sleep efficiency, short primary sleep latency, short REM sleep latency and no slow wave latency.   DIAGNOSIS - Obstructive Sleep Apnea (327.23 [G47.33 ICD-10]) - Hypoxemia   RECOMMENDATIONS - Trial of CPAP therapy on 20 cm H2O with a Small size Fisher&Paykel Full Face Mask Simplus mask and heated humidification. - Hypoxemia corrected with 2L oxygen piped in to the system - Avoid alcohol, sedatives and other CNS depressants that may worsen sleep apnea and disrupt normal sleep architecture. - Sleep hygiene should be reviewed to assess factors that may improve sleep quality. - Weight management and regular exercise should be initiated or continued. - Return to Sleep Center for re-evaluation after 4 weeks of therapy  [Electronically signed] 11/01/2018 01:22 PM  Sherrilyn Rist MD NPI: [id number]

## 2018-11-02 NOTE — Telephone Encounter (Signed)
Called and spoke with patient. Advised her of results below. Patient verbalized understanding and gave consent to order the CPAP. Patient stated that she currently gets in home care from [facility] for her legs to be wrapped and she would like to continue with them for her CPAP therapy as well. Order has been placed. Nothing further needed.

## 2018-11-02 NOTE — Telephone Encounter (Signed)
Patient returning phone call.  Phone number is [phone].

## 2018-11-02 NOTE — Telephone Encounter (Signed)
lmom x1 

## 2018-11-05 DIAGNOSIS — M25572 Pain in left ankle and joints of left foot: Secondary | ICD-10-CM | POA: Diagnosis not present

## 2018-11-05 DIAGNOSIS — M79672 Pain in left foot: Secondary | ICD-10-CM | POA: Diagnosis not present

## 2018-11-05 DIAGNOSIS — I890 Lymphedema, not elsewhere classified: Secondary | ICD-10-CM | POA: Diagnosis not present

## 2018-11-29 ENCOUNTER — Other Ambulatory Visit: Payer: Self-pay

## 2018-11-29 NOTE — Patient Outreach (Signed)
Jean Mcclain [facility]) Care Management  11/29/2018   Jean Mcclain November 27, 1945 [id number]  Subjective: Successful outreach to the patient.  HIPAA verified. The patient states that she has been doing ok.  She denies any falls.  She states that she does have pain in her left ankle but she was sitting in her wheelchair and not having pain at the time.  She states that her blood sugar was 99 this morning fasting and her a1c has remained at 6.3.  She is monitoring her diet and taking her medications.  She states that she did have a low of 69 but she felt she had taken her insulin to close together.  We discussed the signs and symptoms of hypoglycemia.  She verbalized understanding. The patient states that she has an appointment with her primary in May and her endocrinologist in June.   Current Medications:  Current Outpatient Medications  Medication Sig Dispense Refill  . acetaminophen (TYLENOL) 500 MG tablet Take 1,000 mg by mouth 3 (three) times daily as needed for moderate pain or headache.    Marland Kitchen apixaban (ELIQUIS) 2.5 MG TABS tablet Take 2.5 mg by mouth 2 (two) times daily.    . calcitRIOL (ROCALTROL) 0.25 MCG capsule Take 0.25 mcg by mouth daily.    . colchicine 0.6 MG tablet Take 0.6 mg by mouth daily as needed (gout flare).     . cyclobenzaprine (FLEXERIL) 10 MG tablet Take 10 mg by mouth at bedtime.     . ergocalciferol (VITAMIN D2) 50000 UNITS capsule Take 50,000 Units by mouth every Friday.     . febuxostat (ULORIC) 40 MG tablet Take 40 mg by mouth daily.    . furosemide (LASIX) 80 MG tablet Take 80 mg by mouth See admin instructions. Take 80 mg by mouth once daily, may take a second 80 mg dose as needed for swelling    . insulin NPH Human (HUMULIN N,NOVOLIN N) 100 UNIT/ML injection Inject 55 Units into the skin 2 (two) times daily.     . insulin regular (NOVOLIN R,HUMULIN R) 100 units/mL injection Inject 10-30 Units into the skin 3 (three) times daily before meals.     .  lovastatin (MEVACOR) 40 MG tablet Take 40 mg by mouth at bedtime.     . metoprolol succinate (TOPROL-XL) 25 MG 24 hr tablet Take 25 mg by mouth daily.     Marland Kitchen omeprazole (PRILOSEC) 20 MG capsule Take 20 mg by mouth daily.    Marland Kitchen rOPINIRole (REQUIP) 4 MG tablet Take 4 mg by mouth 2 (two) times daily.     Marland Kitchen apixaban (ELIQUIS) 5 MG TABS tablet Take 2 tablets (10 mg total) by mouth 2 (two) times daily. Then on 03/17/17, take 1 tabet (5 mg) two times daily. (Patient not taking: Reported on 11/29/2018) 69 tablet 0  . oxyCODONE (ROXICODONE) 5 MG immediate release tablet Take 1 tablet (5 mg total) by mouth every 4 (four) hours as needed. (Patient not taking: Reported on 11/29/2018) 15 tablet 0   Current Facility-Administered Medications  Medication Dose Route Frequency Provider Last Rate Last Dose  . betamethasone acetate-betamethasone sodium phosphate (CELESTONE) injection 3 mg  3 mg Intramuscular Once Edrick Kins, DPM        Functional Status:  In your present state of health, do you have any difficulty performing the following activities: 05/28/2018  Hearing? N  Vision? N  Difficulty concentrating or making decisions? N  Walking or climbing stairs? Y  Dressing or bathing? Jean Mcclain  Doing errands, shopping? N  Some recent data might be hidden    Fall/Depression Screening: Fall Risk  11/29/2018 06/28/2018 06/28/2018  Falls in the past year? 0 No No   PHQ 2/9 Scores 05/28/2018 05/21/2014  PHQ - 2 Score 1 1    Assessment: Patient will continue to benefit from health coach outreach for disease management and support. [facility] CM Care Plan Problem One     Most Recent Value  [facility] Long Term Goal   In 90 days the patient will maintain an a1c of 6.3  [facility] Long Term Goal Start Date  11/29/18  Interventions for Problem One Long Term Goal  Reviewed fasting goals, discussed food intake and importance of eating meals, reviewed signs and symptoms of hypoglycemia,      Plan: [place] will contact patient in the month  of May and patient agrees to next outreach.   Jean Arms RN, BSN, [place] Direct Dial:  [phone]  Fax: [phone]

## 2018-12-03 DIAGNOSIS — R609 Edema, unspecified: Secondary | ICD-10-CM | POA: Diagnosis not present

## 2018-12-03 DIAGNOSIS — L97819 Non-pressure chronic ulcer of other part of right lower leg with unspecified severity: Secondary | ICD-10-CM | POA: Diagnosis not present

## 2018-12-03 DIAGNOSIS — I872 Venous insufficiency (chronic) (peripheral): Secondary | ICD-10-CM | POA: Diagnosis not present

## 2018-12-03 DIAGNOSIS — L97829 Non-pressure chronic ulcer of other part of left lower leg with unspecified severity: Secondary | ICD-10-CM | POA: Diagnosis not present

## 2018-12-04 DIAGNOSIS — G4733 Obstructive sleep apnea (adult) (pediatric): Secondary | ICD-10-CM | POA: Diagnosis not present

## 2018-12-07 DIAGNOSIS — G4733 Obstructive sleep apnea (adult) (pediatric): Secondary | ICD-10-CM | POA: Diagnosis not present

## 2018-12-11 ENCOUNTER — Encounter: Payer: Self-pay | Admitting: Sports Medicine

## 2018-12-11 ENCOUNTER — Ambulatory Visit: Payer: HMO | Admitting: Sports Medicine

## 2018-12-11 DIAGNOSIS — M79676 Pain in unspecified toe(s): Secondary | ICD-10-CM

## 2018-12-11 DIAGNOSIS — E0842 Diabetes mellitus due to underlying condition with diabetic polyneuropathy: Secondary | ICD-10-CM | POA: Diagnosis not present

## 2018-12-11 DIAGNOSIS — R2681 Unsteadiness on feet: Secondary | ICD-10-CM

## 2018-12-11 DIAGNOSIS — G8929 Other chronic pain: Secondary | ICD-10-CM

## 2018-12-11 DIAGNOSIS — R460 Very low level of personal hygiene: Secondary | ICD-10-CM

## 2018-12-11 DIAGNOSIS — B351 Tinea unguium: Secondary | ICD-10-CM | POA: Diagnosis not present

## 2018-12-11 DIAGNOSIS — M79673 Pain in unspecified foot: Secondary | ICD-10-CM

## 2018-12-11 DIAGNOSIS — I890 Lymphedema, not elsewhere classified: Secondary | ICD-10-CM

## 2018-12-11 MED ORDER — LIDOCAINE 5 % EX PTCH: 1.0000 | MEDICATED_PATCH | CUTANEOUS | 0 refills | Status: DC

## 2018-12-11 NOTE — Progress Notes (Signed)
Patient ID: Jean Mcclain, female   DOB: 1946/01/22, 73 y.o.   MRN: [id number]   Subjective: Jean Mcclain is a 73 y.o. female patient with history of diabetes who presents to office today complaining of long, painful nails  while ambulating in shoes; unable to trim. Patient states that the glucose reading this morning was not recorded and that she continues to have terrible pain in both her feet especially the left and has a difficult time with applying her compression wraps to her legs due to her inability to reach her legs.  Patient denies any other pedal complaints at this time.  Patient Active Problem List   Diagnosis Date Noted  . Acute respiratory distress   . Acute on chronic diastolic CHF (congestive heart failure) ([place])   . AKI (acute kidney injury) ([place]) 03/30/2017  . Sepsis ([place]) 03/30/2017  . Acute deep vein thrombosis (DVT) of popliteal vein of left lower extremity ([place]) 03/11/2017  . Renal insufficiency 03/11/2017  . Insulin-requiring or dependent type II diabetes mellitus ([place]) 03/11/2017  . Essential hypertension 03/11/2017  . Impingement syndrome of left ankle 01/31/2017  . Posterior tibial tendinitis, left leg 01/31/2017  . Incarcerated ventral hernia 01/02/2013   Current Outpatient Medications on File Prior to Visit  Medication Sig Dispense Refill  . acetaminophen (TYLENOL) 500 MG tablet Take 1,000 mg by mouth 3 (three) times daily as needed for moderate pain or headache.    Marland Kitchen apixaban (ELIQUIS) 2.5 MG TABS tablet Take 2.5 mg by mouth 2 (two) times daily.    Marland Kitchen apixaban (ELIQUIS) 5 MG TABS tablet Take 2 tablets (10 mg total) by mouth 2 (two) times daily. Then on 03/17/17, take 1 tabet (5 mg) two times daily. 69 tablet 0  . calcitRIOL (ROCALTROL) 0.25 MCG capsule Take 0.25 mcg by mouth daily.    . colchicine 0.6 MG tablet Take 0.6 mg by mouth daily as needed (gout flare).     . cyclobenzaprine (FLEXERIL) 10 MG tablet Take 10 mg by mouth at bedtime.     . ergocalciferol  (VITAMIN D2) 50000 UNITS capsule Take 50,000 Units by mouth every Friday.     . febuxostat (ULORIC) 40 MG tablet Take 40 mg by mouth daily.    . furosemide (LASIX) 80 MG tablet Take 80 mg by mouth See admin instructions. Take 80 mg by mouth once daily, may take a second 80 mg dose as needed for swelling    . insulin NPH Human (HUMULIN N,NOVOLIN N) 100 UNIT/ML injection Inject 55 Units into the skin 2 (two) times daily.     . insulin regular (NOVOLIN R,HUMULIN R) 100 units/mL injection Inject 10-30 Units into the skin 3 (three) times daily before meals.     . lovastatin (MEVACOR) 40 MG tablet Take 40 mg by mouth at bedtime.     . metoprolol succinate (TOPROL-XL) 25 MG 24 hr tablet Take 25 mg by mouth daily.     Marland Kitchen omeprazole (PRILOSEC) 20 MG capsule Take 20 mg by mouth daily.    Marland Kitchen oxyCODONE (ROXICODONE) 5 MG immediate release tablet Take 1 tablet (5 mg total) by mouth every 4 (four) hours as needed. 15 tablet 0  . rOPINIRole (REQUIP) 4 MG tablet Take 4 mg by mouth 2 (two) times daily.      Current Facility-Administered Medications on File Prior to Visit  Medication Dose Route Frequency Provider Last Rate Last Dose  . betamethasone acetate-betamethasone sodium phosphate (CELESTONE) injection 3 mg  3 mg Intramuscular Once  Edrick Kins, DPM       No Known Allergies  Recent Results (from the past 2160 hour(s))  I-STAT, chem 8     Status: Abnormal   Collection Time: 09/22/18  4:43 PM  Result Value Ref Range   Sodium 141 135 - 145 mmol/L   Potassium 3.6 3.5 - 5.1 mmol/L   Chloride 99 98 - 111 mmol/L   BUN 47 (H) 8 - 23 mg/dL   Creatinine, Ser 3.20 (H) 0.44 - 1.00 mg/dL   Glucose, Bld 121 (H) 70 - 99 mg/dL   Calcium, Ion 1.04 (L) 1.15 - 1.40 mmol/L   TCO2 34 (H) 22 - 32 mmol/L   Hemoglobin 12.6 12.0 - 15.0 g/dL   HCT 37.0 36.0 - 46.0 %    Objective: General: Patient is awake, alert, and oriented x 3 and in mild distress and disheveled appearance consistent with poor hygiene that appears to  be the result of patient declining with self-care.  Integument: Skin is warm, dry and supple bilateral. Nails are tender, long, thickened and dystrophic with subungual debris, consistent with onychomycosis, 1-5 bilateral. No signs of infection. No open lesions or preulcerative lesions present bilateral.  Compression wrap noted t bilateral lower extremities.  Severe dry skin and mild odor from poor hygiene.  Remaining integument unremarkable.  Vasculature:  Dorsalis Pedis pulse 1/4 bilateral. Posterior Tibial pulse  0/4 bilateral. +1pitting edema bilateral with compression sleeve on left Capillary fill time <3 sec 1-5 bilateral. No hair growth to the level of the digits. Temperature gradient within normal limits.   Neurology: The patient has intact sensation measured with a 5.07/10g Semmes Weinstein Monofilament at all pedal sites bilateral . Vibratory sensation diminished bilateral with tuning fork. No Babinski sign present bilateral.   Musculoskeletal: L>R severe pes planus deformity with mild hammertoes noted bilateral.  There is pain with palpation at the medial calcaneal tubercle at the plantar aspect of where the plantar fascia is consistent with plantar fasciitis on left with significant pes planus and foot deformity likely contributing to this pain that has been ongoing and chronic with minimal response to injection therapy.  Muscular strength 4/5 in all lower extremity muscular groups. No tenderness with calf compression bilateral.  No acute signs of DVT.  Assessment and Plan: Problem List Items Addressed This Visit    None    Visit Diagnoses    Pain due to onychomycosis of toenail    -  Primary   Diabetes mellitus due to underlying condition with diabetic polyneuropathy, unspecified whether long term insulin use (HCC)       Lymphedema       Chronic foot pain, unspecified laterality       Relevant Medications   lidocaine (LIDODERM) 5 %   Gait instability       Poor hygiene          -Examined patient. -Discussed and educated patient on diabetic foot care, especially with  regards to the vascular, neurological and musculoskeletal systems.  -Mechanically debrided all nails 1-5 bilateral using sterile nail nipper and filed with dremel without incident  -Patient was noted to have significant dry peeling scaly skin odor and difficulties providing self-care due to severe lymphedema bilateral.  Patient reports that she received an edema pump but has not gotten a call on how to use it or further instructions and reports that she even has difficulty with using her CircAid wraps bilateral due to inability to reach her legs to provide self-care. -I advised  patient to look for the packing slipped in the box that she received that has the pumps in it to call the company to figure out what provider has ordered this for her.  I also recommend that patient may benefit from home care services like having a home aide who could help with activities of daily living.  Patient is a fall risk and currently uses a wheelchair because she has difficulty due to chronic pain in her feet and legs especially on the left and pain with ambulation. -Prescribed lidocaine patch and advised patient she may need assistance with applying these patches to her left foot to help with pain -Patient to return in 3 months for at risk foot care -Patient advised to call the office if any problems or questions arise in the meantime.  Landis Martins, DPM

## 2018-12-12 ENCOUNTER — Telehealth: Payer: Self-pay | Admitting: *Deleted

## 2018-12-12 NOTE — Telephone Encounter (Signed)
She does not have any open wounds just legs problems from Lymphedema -Dr Cannon Kettle

## 2018-12-12 NOTE — Telephone Encounter (Signed)
-----   Message from Landis Martins, [place] sent at 12/11/2018 10:19 PM EST ----- Regarding: Aide at home Patient has difficulties with self-care especially since she has extreme edema bilateral has difficulties with bathing and activities of daily living I think patient will benefit from having a aide to help her with these basic activities in her home.  Patient reports that she has worked with advanced home care in the past, can we see if we can find an agency that could possibly help this patient? Thanks Dr. Chauncey Cruel

## 2018-12-13 NOTE — Telephone Encounter (Signed)
She has Cercade wraps. I think in the past we tried getting her help with her Lymphedema but wasn't successful to find a home agency and I think she had to go to [name] for them to help to wrapping her legs. I'm looking for someone that can help her put on her wraps, shower, and basic ADLs I told the patient that we would try to see what additional help we can get for her at home -Dr. Cannon Kettle

## 2018-12-13 NOTE — Telephone Encounter (Signed)
Faxed required form, LOV and demographics with Dr. Leeanne Rio orders of 12/11/2018 19:82SO to include application of Cercade leg wraps to [facility].

## 2018-12-17 ENCOUNTER — Other Ambulatory Visit: Payer: Self-pay

## 2018-12-17 NOTE — Patient Outreach (Signed)
[facility]) Care Management  12/17/2018  [name] 07-04-1946 [id number]    [place] made an unsuccessful outreach attempt to the patient.  No answer.  Left a HIPAA compliant voicemail with contact information.  Plan:  RN Health Coach will make another outreach attempt within ten business days.  Lazaro Arms RN, BSN, [place] Direct Dial:  [phone]  Fax: [phone]

## 2018-12-19 DIAGNOSIS — Z6841 Body Mass Index (BMI) 40.0 and over, adult: Secondary | ICD-10-CM | POA: Diagnosis not present

## 2018-12-19 DIAGNOSIS — G4733 Obstructive sleep apnea (adult) (pediatric): Secondary | ICD-10-CM | POA: Diagnosis not present

## 2018-12-19 DIAGNOSIS — I87319 Chronic venous hypertension (idiopathic) with ulcer of unspecified lower extremity: Secondary | ICD-10-CM | POA: Diagnosis not present

## 2018-12-19 DIAGNOSIS — N184 Chronic kidney disease, stage 4 (severe): Secondary | ICD-10-CM | POA: Diagnosis not present

## 2018-12-21 ENCOUNTER — Other Ambulatory Visit: Payer: Self-pay

## 2018-12-21 DIAGNOSIS — G4733 Obstructive sleep apnea (adult) (pediatric): Secondary | ICD-10-CM | POA: Diagnosis not present

## 2018-12-21 NOTE — Patient Outreach (Signed)
[facility]) Care Management  12/21/2018  Jean Mcclain Apr 14, 1946 [id number]    2nd attempt to outreach the patient to inform her of change in care management.  No answer. HIPAA compliant voicemail left with contact information.  Plan:  RN Health Coach will make an outreach attempt to the patient within four business days.  Lazaro Arms RN, BSN, [place] Direct Dial:  [phone]  Fax: [phone]

## 2018-12-21 NOTE — Patient Outreach (Signed)
[facility]) Care Management  12/21/2018  Jean Mcclain 1946/02/26 [id number]    Received return call from the patient.  HIPAA verified.  Congratulated the patient for signing up for the [facility] program and informed her I would no longer be calling her because she now has a Engineer, building services. Advised the patient that her personal care manager is Jean Mcclain and she should be contacting her in the next few weeks.  I also gave her the contact number just in case she has any questions.   The patient stated that she appreciated the call.  Plan: RN Health Coach closing the program due to patient is in another CM program.  RN Health Coach will notify Jean Mcclain I have informed the patient.  I had the patient scheduled to call sometime in May. I will send discipline closure letter to the physician.  Lazaro Arms RN, BSN, [place] Direct Dial:  [phone]  Fax: [phone]

## 2018-12-24 ENCOUNTER — Ambulatory Visit: Payer: Self-pay

## 2018-12-24 DIAGNOSIS — I739 Peripheral vascular disease, unspecified: Secondary | ICD-10-CM | POA: Diagnosis not present

## 2018-12-24 DIAGNOSIS — N184 Chronic kidney disease, stage 4 (severe): Secondary | ICD-10-CM | POA: Diagnosis not present

## 2018-12-24 DIAGNOSIS — E662 Morbid (severe) obesity with alveolar hypoventilation: Secondary | ICD-10-CM | POA: Diagnosis not present

## 2018-12-24 DIAGNOSIS — G4733 Obstructive sleep apnea (adult) (pediatric): Secondary | ICD-10-CM | POA: Diagnosis not present

## 2018-12-24 DIAGNOSIS — I87319 Chronic venous hypertension (idiopathic) with ulcer of unspecified lower extremity: Secondary | ICD-10-CM | POA: Diagnosis not present

## 2019-01-02 DIAGNOSIS — G4733 Obstructive sleep apnea (adult) (pediatric): Secondary | ICD-10-CM | POA: Diagnosis not present

## 2019-01-05 DIAGNOSIS — G4733 Obstructive sleep apnea (adult) (pediatric): Secondary | ICD-10-CM | POA: Diagnosis not present

## 2019-01-10 ENCOUNTER — Other Ambulatory Visit: Payer: Self-pay

## 2019-01-10 NOTE — Patient Outreach (Addendum)
  [facility]) Care Management Chronic Special Needs Program  01/10/2019  Name: Jean Mcclain DOB: 1946/04/03  MRN: [id number]  Ms. Jean Mcclain is enrolled in a chronic special needs plan for Diabetes. Chronic Care Management Coordinator telephoned client to review health risk assessment and to develop individualized care plan.  Introduced the chronic care management program, importance of client participation, and taking their care plan to all provider appointments and inpatient facilities.  Reviewed the transition of care process and possible referral to community care management.  Subjective: Client states that she has been contacted by [facility] and they see her on 01/28/19.  States that they will  help her get help in her home and she does not want the [facility] worker to contact her.  States her diabetes is doing good and her blood sugars range from 83-130 most of the time.  Denies any difficulty affording her medications  Goals Addressed            This Visit's Progress   . Advanced Care Planning complete by next 9 months      . Client understands the importance of follow-up with providers by attending scheduled visits      . Client will have ADL/IADL needs addressed by next 6 months      . Client will use Assistive Devices as needed and verbalize understanding of device use      . Client will verbalize knowledge of self management of Hypertension as evidences by BP reading of 140/90 or less; or as defined by provider      . HEMOGLOBIN A1C < 7.0       Diabetes self management actions:  Glucose monitoring per provider recommendations  Perform Quality checks on blood meter  Eat Healthy  Check feet daily  Visit provider every 3-6 months as directed  Hbg A1C level every 3-6 months.  Eye Exam yearly    . Maintain timely refills of diabetic medication as prescribed within the year .      Marland Kitchen Obtain annual  Lipid Profile, LDL-C      .  Obtain Annual Eye (retinal)  Exam       . Obtain Annual Foot Exam      . Obtain annual screen for micro albuminuria (urine) , nephropathy (kidney problems)      . Obtain Hemoglobin A1C at least 2 times per year      . Visit Primary Care Provider or Endocrinologist at least 2 times per year         Client is meeting diabetes self management goal of hemoglobin A1C of <7% with last reading of 6.3%.  Declines referral to pharamcy and social work as she will be followed by the [employer] team.   Plan:  Send successful outreach letter with a copy of their individualized care plan, Send individual care plan to provider and Send educational material-Advanced Directives HTN, diabetes  Chronic care management coordination will outreach in:  6 Months  Client to be followed by [facility] and will coordinate  with them as needed   [place], Jackquline Denmark, [place] Management [phone]

## 2019-02-02 DIAGNOSIS — G4733 Obstructive sleep apnea (adult) (pediatric): Secondary | ICD-10-CM | POA: Diagnosis not present

## 2019-02-04 DIAGNOSIS — E1129 Type 2 diabetes mellitus with other diabetic kidney complication: Secondary | ICD-10-CM | POA: Diagnosis not present

## 2019-02-04 DIAGNOSIS — G4733 Obstructive sleep apnea (adult) (pediatric): Secondary | ICD-10-CM | POA: Diagnosis not present

## 2019-02-04 DIAGNOSIS — Z1331 Encounter for screening for depression: Secondary | ICD-10-CM | POA: Diagnosis not present

## 2019-02-04 DIAGNOSIS — I10 Essential (primary) hypertension: Secondary | ICD-10-CM | POA: Diagnosis not present

## 2019-02-04 DIAGNOSIS — E7849 Other hyperlipidemia: Secondary | ICD-10-CM | POA: Diagnosis not present

## 2019-02-04 DIAGNOSIS — M859 Disorder of bone density and structure, unspecified: Secondary | ICD-10-CM | POA: Diagnosis not present

## 2019-02-04 DIAGNOSIS — M109 Gout, unspecified: Secondary | ICD-10-CM | POA: Diagnosis not present

## 2019-02-05 DIAGNOSIS — G4733 Obstructive sleep apnea (adult) (pediatric): Secondary | ICD-10-CM | POA: Diagnosis not present

## 2019-02-11 DIAGNOSIS — E1129 Type 2 diabetes mellitus with other diabetic kidney complication: Secondary | ICD-10-CM | POA: Diagnosis not present

## 2019-02-11 DIAGNOSIS — G4733 Obstructive sleep apnea (adult) (pediatric): Secondary | ICD-10-CM | POA: Diagnosis not present

## 2019-02-11 DIAGNOSIS — E1121 Type 2 diabetes mellitus with diabetic nephropathy: Secondary | ICD-10-CM | POA: Diagnosis not present

## 2019-02-11 DIAGNOSIS — E1140 Type 2 diabetes mellitus with diabetic neuropathy, unspecified: Secondary | ICD-10-CM | POA: Diagnosis not present

## 2019-02-11 DIAGNOSIS — E785 Hyperlipidemia, unspecified: Secondary | ICD-10-CM | POA: Diagnosis not present

## 2019-02-11 DIAGNOSIS — I129 Hypertensive chronic kidney disease with stage 1 through stage 4 chronic kidney disease, or unspecified chronic kidney disease: Secondary | ICD-10-CM | POA: Diagnosis not present

## 2019-02-11 DIAGNOSIS — E662 Morbid (severe) obesity with alveolar hypoventilation: Secondary | ICD-10-CM | POA: Diagnosis not present

## 2019-02-11 DIAGNOSIS — I872 Venous insufficiency (chronic) (peripheral): Secondary | ICD-10-CM | POA: Diagnosis not present

## 2019-02-11 DIAGNOSIS — N184 Chronic kidney disease, stage 4 (severe): Secondary | ICD-10-CM | POA: Diagnosis not present

## 2019-02-11 DIAGNOSIS — R2681 Unsteadiness on feet: Secondary | ICD-10-CM | POA: Diagnosis not present

## 2019-02-11 DIAGNOSIS — Z0000 Encounter for general adult medical examination without abnormal findings: Secondary | ICD-10-CM | POA: Diagnosis not present

## 2019-02-11 DIAGNOSIS — M109 Gout, unspecified: Secondary | ICD-10-CM | POA: Diagnosis not present

## 2019-02-14 DIAGNOSIS — N184 Chronic kidney disease, stage 4 (severe): Secondary | ICD-10-CM | POA: Diagnosis not present

## 2019-02-14 DIAGNOSIS — E1122 Type 2 diabetes mellitus with diabetic chronic kidney disease: Secondary | ICD-10-CM | POA: Diagnosis not present

## 2019-02-14 DIAGNOSIS — I129 Hypertensive chronic kidney disease with stage 1 through stage 4 chronic kidney disease, or unspecified chronic kidney disease: Secondary | ICD-10-CM | POA: Diagnosis not present

## 2019-02-14 DIAGNOSIS — N2581 Secondary hyperparathyroidism of renal origin: Secondary | ICD-10-CM | POA: Diagnosis not present

## 2019-02-18 ENCOUNTER — Other Ambulatory Visit: Payer: Self-pay

## 2019-02-18 NOTE — Patient Outreach (Signed)
  [facility]) Care Management Chronic Special Needs Program    02/18/2019  Name: Jean Mcclain, DOB: 04-Dec-1945  MRN: [id number]   Ms. [name] is enrolled in a chronic special needs plan for Diabetes. Client is actively  engaged with [facility]  Client called nurse advise line on 02/17/19.  Reports she is now paying for her insulin $45 and that it was a $0 co-pay. Secure message sent to [facility] about client's concern about cost of insulin  Peter Garter RN, [facility], [place] Management [phone]

## 2019-02-21 DIAGNOSIS — Z96653 Presence of artificial knee joint, bilateral: Secondary | ICD-10-CM | POA: Diagnosis not present

## 2019-02-21 DIAGNOSIS — E1142 Type 2 diabetes mellitus with diabetic polyneuropathy: Secondary | ICD-10-CM | POA: Diagnosis not present

## 2019-02-21 DIAGNOSIS — E1122 Type 2 diabetes mellitus with diabetic chronic kidney disease: Secondary | ICD-10-CM | POA: Diagnosis not present

## 2019-02-21 DIAGNOSIS — I129 Hypertensive chronic kidney disease with stage 1 through stage 4 chronic kidney disease, or unspecified chronic kidney disease: Secondary | ICD-10-CM | POA: Diagnosis not present

## 2019-02-21 DIAGNOSIS — M19072 Primary osteoarthritis, left ankle and foot: Secondary | ICD-10-CM | POA: Diagnosis not present

## 2019-02-21 DIAGNOSIS — I87309 Chronic venous hypertension (idiopathic) without complications of unspecified lower extremity: Secondary | ICD-10-CM | POA: Diagnosis not present

## 2019-02-21 DIAGNOSIS — E1151 Type 2 diabetes mellitus with diabetic peripheral angiopathy without gangrene: Secondary | ICD-10-CM | POA: Diagnosis not present

## 2019-02-21 DIAGNOSIS — I890 Lymphedema, not elsewhere classified: Secondary | ICD-10-CM | POA: Diagnosis not present

## 2019-02-21 DIAGNOSIS — N184 Chronic kidney disease, stage 4 (severe): Secondary | ICD-10-CM | POA: Diagnosis not present

## 2019-02-21 DIAGNOSIS — F329 Major depressive disorder, single episode, unspecified: Secondary | ICD-10-CM | POA: Diagnosis not present

## 2019-02-21 DIAGNOSIS — M109 Gout, unspecified: Secondary | ICD-10-CM | POA: Diagnosis not present

## 2019-02-21 DIAGNOSIS — E785 Hyperlipidemia, unspecified: Secondary | ICD-10-CM | POA: Diagnosis not present

## 2019-02-21 DIAGNOSIS — M1611 Unilateral primary osteoarthritis, right hip: Secondary | ICD-10-CM | POA: Diagnosis not present

## 2019-02-21 DIAGNOSIS — I872 Venous insufficiency (chronic) (peripheral): Secondary | ICD-10-CM | POA: Diagnosis not present

## 2019-02-21 DIAGNOSIS — G8929 Other chronic pain: Secondary | ICD-10-CM | POA: Diagnosis not present

## 2019-02-21 DIAGNOSIS — L03116 Cellulitis of left lower limb: Secondary | ICD-10-CM | POA: Diagnosis not present

## 2019-02-21 DIAGNOSIS — R2681 Unsteadiness on feet: Secondary | ICD-10-CM | POA: Diagnosis not present

## 2019-02-21 DIAGNOSIS — K219 Gastro-esophageal reflux disease without esophagitis: Secondary | ICD-10-CM | POA: Diagnosis not present

## 2019-02-21 DIAGNOSIS — M5386 Other specified dorsopathies, lumbar region: Secondary | ICD-10-CM | POA: Diagnosis not present

## 2019-02-21 DIAGNOSIS — Z4800 Encounter for change or removal of nonsurgical wound dressing: Secondary | ICD-10-CM | POA: Diagnosis not present

## 2019-02-21 DIAGNOSIS — M797 Fibromyalgia: Secondary | ICD-10-CM | POA: Diagnosis not present

## 2019-02-21 DIAGNOSIS — E662 Morbid (severe) obesity with alveolar hypoventilation: Secondary | ICD-10-CM | POA: Diagnosis not present

## 2019-02-21 DIAGNOSIS — G4733 Obstructive sleep apnea (adult) (pediatric): Secondary | ICD-10-CM | POA: Diagnosis not present

## 2019-02-21 DIAGNOSIS — M8580 Other specified disorders of bone density and structure, unspecified site: Secondary | ICD-10-CM | POA: Diagnosis not present

## 2019-02-21 DIAGNOSIS — Z6841 Body Mass Index (BMI) 40.0 and over, adult: Secondary | ICD-10-CM | POA: Diagnosis not present

## 2019-02-28 ENCOUNTER — Ambulatory Visit: Payer: HMO

## 2019-03-01 DIAGNOSIS — E785 Hyperlipidemia, unspecified: Secondary | ICD-10-CM | POA: Diagnosis not present

## 2019-03-01 DIAGNOSIS — G8929 Other chronic pain: Secondary | ICD-10-CM | POA: Diagnosis not present

## 2019-03-01 DIAGNOSIS — N184 Chronic kidney disease, stage 4 (severe): Secondary | ICD-10-CM | POA: Diagnosis not present

## 2019-03-01 DIAGNOSIS — M797 Fibromyalgia: Secondary | ICD-10-CM | POA: Diagnosis not present

## 2019-03-01 DIAGNOSIS — Z96653 Presence of artificial knee joint, bilateral: Secondary | ICD-10-CM | POA: Diagnosis not present

## 2019-03-01 DIAGNOSIS — L03116 Cellulitis of left lower limb: Secondary | ICD-10-CM | POA: Diagnosis not present

## 2019-03-01 DIAGNOSIS — Z6841 Body Mass Index (BMI) 40.0 and over, adult: Secondary | ICD-10-CM | POA: Diagnosis not present

## 2019-03-01 DIAGNOSIS — E662 Morbid (severe) obesity with alveolar hypoventilation: Secondary | ICD-10-CM | POA: Diagnosis not present

## 2019-03-01 DIAGNOSIS — F329 Major depressive disorder, single episode, unspecified: Secondary | ICD-10-CM | POA: Diagnosis not present

## 2019-03-01 DIAGNOSIS — M8580 Other specified disorders of bone density and structure, unspecified site: Secondary | ICD-10-CM | POA: Diagnosis not present

## 2019-03-01 DIAGNOSIS — E1122 Type 2 diabetes mellitus with diabetic chronic kidney disease: Secondary | ICD-10-CM | POA: Diagnosis not present

## 2019-03-01 DIAGNOSIS — E1142 Type 2 diabetes mellitus with diabetic polyneuropathy: Secondary | ICD-10-CM | POA: Diagnosis not present

## 2019-03-01 DIAGNOSIS — M5386 Other specified dorsopathies, lumbar region: Secondary | ICD-10-CM | POA: Diagnosis not present

## 2019-03-01 DIAGNOSIS — I87309 Chronic venous hypertension (idiopathic) without complications of unspecified lower extremity: Secondary | ICD-10-CM | POA: Diagnosis not present

## 2019-03-01 DIAGNOSIS — G4733 Obstructive sleep apnea (adult) (pediatric): Secondary | ICD-10-CM | POA: Diagnosis not present

## 2019-03-01 DIAGNOSIS — M109 Gout, unspecified: Secondary | ICD-10-CM | POA: Diagnosis not present

## 2019-03-01 DIAGNOSIS — K219 Gastro-esophageal reflux disease without esophagitis: Secondary | ICD-10-CM | POA: Diagnosis not present

## 2019-03-01 DIAGNOSIS — E1151 Type 2 diabetes mellitus with diabetic peripheral angiopathy without gangrene: Secondary | ICD-10-CM | POA: Diagnosis not present

## 2019-03-01 DIAGNOSIS — I872 Venous insufficiency (chronic) (peripheral): Secondary | ICD-10-CM | POA: Diagnosis not present

## 2019-03-01 DIAGNOSIS — Z4800 Encounter for change or removal of nonsurgical wound dressing: Secondary | ICD-10-CM | POA: Diagnosis not present

## 2019-03-01 DIAGNOSIS — I890 Lymphedema, not elsewhere classified: Secondary | ICD-10-CM | POA: Diagnosis not present

## 2019-03-01 DIAGNOSIS — R2681 Unsteadiness on feet: Secondary | ICD-10-CM | POA: Diagnosis not present

## 2019-03-01 DIAGNOSIS — M1611 Unilateral primary osteoarthritis, right hip: Secondary | ICD-10-CM | POA: Diagnosis not present

## 2019-03-01 DIAGNOSIS — M19072 Primary osteoarthritis, left ankle and foot: Secondary | ICD-10-CM | POA: Diagnosis not present

## 2019-03-01 DIAGNOSIS — I129 Hypertensive chronic kidney disease with stage 1 through stage 4 chronic kidney disease, or unspecified chronic kidney disease: Secondary | ICD-10-CM | POA: Diagnosis not present

## 2019-03-07 DIAGNOSIS — I5033 Acute on chronic diastolic (congestive) heart failure: Secondary | ICD-10-CM | POA: Diagnosis not present

## 2019-03-07 DIAGNOSIS — J9602 Acute respiratory failure with hypercapnia: Secondary | ICD-10-CM | POA: Diagnosis not present

## 2019-03-08 DIAGNOSIS — I890 Lymphedema, not elsewhere classified: Secondary | ICD-10-CM | POA: Diagnosis not present

## 2019-03-08 DIAGNOSIS — E662 Morbid (severe) obesity with alveolar hypoventilation: Secondary | ICD-10-CM | POA: Diagnosis not present

## 2019-03-08 DIAGNOSIS — M5386 Other specified dorsopathies, lumbar region: Secondary | ICD-10-CM | POA: Diagnosis not present

## 2019-03-08 DIAGNOSIS — Z6841 Body Mass Index (BMI) 40.0 and over, adult: Secondary | ICD-10-CM | POA: Diagnosis not present

## 2019-03-08 DIAGNOSIS — M8580 Other specified disorders of bone density and structure, unspecified site: Secondary | ICD-10-CM | POA: Diagnosis not present

## 2019-03-08 DIAGNOSIS — L03116 Cellulitis of left lower limb: Secondary | ICD-10-CM | POA: Diagnosis not present

## 2019-03-08 DIAGNOSIS — E1142 Type 2 diabetes mellitus with diabetic polyneuropathy: Secondary | ICD-10-CM | POA: Diagnosis not present

## 2019-03-08 DIAGNOSIS — G4733 Obstructive sleep apnea (adult) (pediatric): Secondary | ICD-10-CM | POA: Diagnosis not present

## 2019-03-08 DIAGNOSIS — F329 Major depressive disorder, single episode, unspecified: Secondary | ICD-10-CM | POA: Diagnosis not present

## 2019-03-08 DIAGNOSIS — M1611 Unilateral primary osteoarthritis, right hip: Secondary | ICD-10-CM | POA: Diagnosis not present

## 2019-03-08 DIAGNOSIS — E1151 Type 2 diabetes mellitus with diabetic peripheral angiopathy without gangrene: Secondary | ICD-10-CM | POA: Diagnosis not present

## 2019-03-08 DIAGNOSIS — I129 Hypertensive chronic kidney disease with stage 1 through stage 4 chronic kidney disease, or unspecified chronic kidney disease: Secondary | ICD-10-CM | POA: Diagnosis not present

## 2019-03-08 DIAGNOSIS — K219 Gastro-esophageal reflux disease without esophagitis: Secondary | ICD-10-CM | POA: Diagnosis not present

## 2019-03-08 DIAGNOSIS — I87309 Chronic venous hypertension (idiopathic) without complications of unspecified lower extremity: Secondary | ICD-10-CM | POA: Diagnosis not present

## 2019-03-08 DIAGNOSIS — M797 Fibromyalgia: Secondary | ICD-10-CM | POA: Diagnosis not present

## 2019-03-08 DIAGNOSIS — E1122 Type 2 diabetes mellitus with diabetic chronic kidney disease: Secondary | ICD-10-CM | POA: Diagnosis not present

## 2019-03-08 DIAGNOSIS — Z96653 Presence of artificial knee joint, bilateral: Secondary | ICD-10-CM | POA: Diagnosis not present

## 2019-03-08 DIAGNOSIS — N184 Chronic kidney disease, stage 4 (severe): Secondary | ICD-10-CM | POA: Diagnosis not present

## 2019-03-08 DIAGNOSIS — E785 Hyperlipidemia, unspecified: Secondary | ICD-10-CM | POA: Diagnosis not present

## 2019-03-08 DIAGNOSIS — G8929 Other chronic pain: Secondary | ICD-10-CM | POA: Diagnosis not present

## 2019-03-08 DIAGNOSIS — M109 Gout, unspecified: Secondary | ICD-10-CM | POA: Diagnosis not present

## 2019-03-08 DIAGNOSIS — I872 Venous insufficiency (chronic) (peripheral): Secondary | ICD-10-CM | POA: Diagnosis not present

## 2019-03-08 DIAGNOSIS — M19072 Primary osteoarthritis, left ankle and foot: Secondary | ICD-10-CM | POA: Diagnosis not present

## 2019-03-08 DIAGNOSIS — Z4800 Encounter for change or removal of nonsurgical wound dressing: Secondary | ICD-10-CM | POA: Diagnosis not present

## 2019-03-08 DIAGNOSIS — R2681 Unsteadiness on feet: Secondary | ICD-10-CM | POA: Diagnosis not present

## 2019-03-12 DIAGNOSIS — M19072 Primary osteoarthritis, left ankle and foot: Secondary | ICD-10-CM | POA: Diagnosis not present

## 2019-03-12 DIAGNOSIS — E1142 Type 2 diabetes mellitus with diabetic polyneuropathy: Secondary | ICD-10-CM | POA: Diagnosis not present

## 2019-03-12 DIAGNOSIS — Z6841 Body Mass Index (BMI) 40.0 and over, adult: Secondary | ICD-10-CM | POA: Diagnosis not present

## 2019-03-12 DIAGNOSIS — I890 Lymphedema, not elsewhere classified: Secondary | ICD-10-CM | POA: Diagnosis not present

## 2019-03-12 DIAGNOSIS — K219 Gastro-esophageal reflux disease without esophagitis: Secondary | ICD-10-CM | POA: Diagnosis not present

## 2019-03-12 DIAGNOSIS — R2681 Unsteadiness on feet: Secondary | ICD-10-CM | POA: Diagnosis not present

## 2019-03-12 DIAGNOSIS — M8580 Other specified disorders of bone density and structure, unspecified site: Secondary | ICD-10-CM | POA: Diagnosis not present

## 2019-03-12 DIAGNOSIS — E662 Morbid (severe) obesity with alveolar hypoventilation: Secondary | ICD-10-CM | POA: Diagnosis not present

## 2019-03-12 DIAGNOSIS — I129 Hypertensive chronic kidney disease with stage 1 through stage 4 chronic kidney disease, or unspecified chronic kidney disease: Secondary | ICD-10-CM | POA: Diagnosis not present

## 2019-03-12 DIAGNOSIS — Z96653 Presence of artificial knee joint, bilateral: Secondary | ICD-10-CM | POA: Diagnosis not present

## 2019-03-12 DIAGNOSIS — M1611 Unilateral primary osteoarthritis, right hip: Secondary | ICD-10-CM | POA: Diagnosis not present

## 2019-03-12 DIAGNOSIS — G4733 Obstructive sleep apnea (adult) (pediatric): Secondary | ICD-10-CM | POA: Diagnosis not present

## 2019-03-12 DIAGNOSIS — I872 Venous insufficiency (chronic) (peripheral): Secondary | ICD-10-CM | POA: Diagnosis not present

## 2019-03-12 DIAGNOSIS — I87309 Chronic venous hypertension (idiopathic) without complications of unspecified lower extremity: Secondary | ICD-10-CM | POA: Diagnosis not present

## 2019-03-12 DIAGNOSIS — M5386 Other specified dorsopathies, lumbar region: Secondary | ICD-10-CM | POA: Diagnosis not present

## 2019-03-12 DIAGNOSIS — Z4800 Encounter for change or removal of nonsurgical wound dressing: Secondary | ICD-10-CM | POA: Diagnosis not present

## 2019-03-12 DIAGNOSIS — L03116 Cellulitis of left lower limb: Secondary | ICD-10-CM | POA: Diagnosis not present

## 2019-03-12 DIAGNOSIS — N184 Chronic kidney disease, stage 4 (severe): Secondary | ICD-10-CM | POA: Diagnosis not present

## 2019-03-12 DIAGNOSIS — F329 Major depressive disorder, single episode, unspecified: Secondary | ICD-10-CM | POA: Diagnosis not present

## 2019-03-12 DIAGNOSIS — E785 Hyperlipidemia, unspecified: Secondary | ICD-10-CM | POA: Diagnosis not present

## 2019-03-12 DIAGNOSIS — G8929 Other chronic pain: Secondary | ICD-10-CM | POA: Diagnosis not present

## 2019-03-12 DIAGNOSIS — M109 Gout, unspecified: Secondary | ICD-10-CM | POA: Diagnosis not present

## 2019-03-12 DIAGNOSIS — E1122 Type 2 diabetes mellitus with diabetic chronic kidney disease: Secondary | ICD-10-CM | POA: Diagnosis not present

## 2019-03-12 DIAGNOSIS — M797 Fibromyalgia: Secondary | ICD-10-CM | POA: Diagnosis not present

## 2019-03-12 DIAGNOSIS — E1151 Type 2 diabetes mellitus with diabetic peripheral angiopathy without gangrene: Secondary | ICD-10-CM | POA: Diagnosis not present

## 2019-03-13 ENCOUNTER — Other Ambulatory Visit: Payer: Self-pay

## 2019-03-13 ENCOUNTER — Ambulatory Visit (INDEPENDENT_AMBULATORY_CARE_PROVIDER_SITE_OTHER): Payer: HMO | Admitting: Nurse Practitioner

## 2019-03-13 ENCOUNTER — Encounter: Payer: Self-pay | Admitting: Nurse Practitioner

## 2019-03-13 DIAGNOSIS — Z20828 Contact with and (suspected) exposure to other viral communicable diseases: Secondary | ICD-10-CM | POA: Diagnosis not present

## 2019-03-13 DIAGNOSIS — R110 Nausea: Secondary | ICD-10-CM | POA: Diagnosis not present

## 2019-03-13 DIAGNOSIS — Z1159 Encounter for screening for other viral diseases: Secondary | ICD-10-CM | POA: Diagnosis not present

## 2019-03-13 DIAGNOSIS — U071 COVID-19: Secondary | ICD-10-CM | POA: Diagnosis not present

## 2019-03-13 DIAGNOSIS — G4733 Obstructive sleep apnea (adult) (pediatric): Secondary | ICD-10-CM | POA: Diagnosis not present

## 2019-03-13 DIAGNOSIS — N184 Chronic kidney disease, stage 4 (severe): Secondary | ICD-10-CM | POA: Diagnosis not present

## 2019-03-13 NOTE — Patient Instructions (Signed)
OSA: Discussed recent split night study reults with patient Patient refuses CPAP at this time Patient continue O2 at 2L [place] at night  Morbid obesity/Possible obesity hypoventilation syndrome: Continue to work on healthy weight Still encourage evaluation by [employer] surgery for weight Continue O2 as needed to keep sats above 90% with exertion  Follow up: Follow up with Dr. Ander Slade in 3 months or sooner if needed

## 2019-03-13 NOTE — Progress Notes (Signed)
Virtual Visit via Telephone Note  I connected with Jean Mcclain on 03/13/19 at  9:30 AM EDT by telephone and verified that I am speaking with the correct person using two identifiers.  Location: Patient: home Provider: office   I discussed the limitations, risks, security and privacy concerns of performing an evaluation and management service by telephone and the availability of in person appointments. I also discussed with the patient that there may be a patient responsible charge related to this service. The patient expressed understanding and agreed to proceed.   History of Present Illness: 73 year old female former smoker with severe sleep apnea followed by Dr. Ander Slade PMH includes: Hypercapnic respiratory failure, chronic diastolic heart failure, history of DVT  Patient has a tele-visit today to follow-up on OSA.  Patient states that this has been a stable interval for her.  Patient was diagnosed with sleep apnea 5 years ago.  She does have a history of snoring and witnessed apneas.  She was last seen by Dr. Ander Slade on 09/12/2018.  Patient has not been using CPAP due to intolerance of mask.  She has been for a mask fitting and has tried several different masks, but states that none of them have worked for her.  She did have a split night study ordered on 10/30/2018 which showed severe sleep apnea and low SPO2 requiring 2 L nasal cannula.  Patient states that she refused to continue using CPAP, but has been using 2 L of O2 at night.  She also uses O2 during the day as needed.  Denies any recent significant shortness of breath.  Patient states that she is going to be tested for COVID today due to recent loss of appetite.  This test was ordered by her PCP.  Denies any recent fever.  At her last visit with Dr. Beola Cord it was recommended that she be evaluated by [facility] for possible bariatric surgery.  Patient is currently wheelchair-bound due to fall a couple years ago and still  has complications with foot injury and rib fractures.  Patient states that she did not go for evaluation. Denies f/c/s, n/v/d, hemoptysis, PND, leg swelling.   Observations/Objective:  Split night study 10/29/18 - AHI 66.2, SpO2 low requiring 2 L [place], ending pressure @ 20 CM H20,   Epworth sleepiness scale of 23   Assessment and Plan: Patient has not been using CPAP due to intolerance of mask.  She has been for a mask fitting and has tried several different masks, but states that none of them have worked for her.  She did have a split night study ordered on 10/30/2018 which showed severe sleep apnea and low SPO2 requiring 2 L nasal cannula.  Patient states that she refused to continue using CPAP, but has been using 2 L of O2 at night.  She also uses O2 during the day as needed. Will discuss Split night study with Dr. Ander Slade for any additional  recommendations.   Patient Instructions  OSA: Discussed recent split night study reults with patient Patient refuses CPAP at this time Patient continue O2 at 2L [place] at night  Morbid obesity/Possible obesity hypoventilation syndrome: Continue to work on healthy weight Still encourage evaluation by [facility] surgery for weight Continue O2 as needed to keep sats above 90% with exertion   Follow Up Instructions:  Follow up with Dr. Ander Slade in 3 months or sooner if needed   I discussed the assessment and treatment plan with the patient. The patient was  provided an opportunity to ask questions and all were answered. The patient agreed with the plan and demonstrated an understanding of the instructions.   The patient was advised to call back or seek an in-person evaluation if the symptoms worsen or if the condition fails to improve as anticipated.  I provided 22 minutes of non-face-to-face time during this encounter.   Fenton Foy, NP

## 2019-03-13 NOTE — Assessment & Plan Note (Signed)
Patient has not been using CPAP due to intolerance of mask.  She has been for a mask fitting and has tried several different masks, but states that none of them have worked for her.  She did have a split night study ordered on 10/30/2018 which showed severe sleep apnea and low SPO2 requiring 2 L nasal cannula.  Patient states that she refused to continue using CPAP, but has been using 2 L of O2 at night.  She also uses O2 during the day as needed. Will discuss Split night study with Dr. Ander Slade for any additional  recommendations.   Patient Instructions  OSA: Discussed recent split night study reults with patient Patient refuses CPAP at this time Patient continue O2 at 2L [place] at night  Morbid obesity/Possible obesity hypoventilation syndrome: Continue to work on healthy weight Still encourage evaluation by [facility] surgery for weight Continue O2 as needed to keep sats above 90% with exertion  Follow up: Follow up with Dr. Ander Slade in 3 months or sooner if needed

## 2019-03-14 DIAGNOSIS — Z4800 Encounter for change or removal of nonsurgical wound dressing: Secondary | ICD-10-CM | POA: Diagnosis not present

## 2019-03-14 DIAGNOSIS — E1142 Type 2 diabetes mellitus with diabetic polyneuropathy: Secondary | ICD-10-CM | POA: Diagnosis not present

## 2019-03-14 DIAGNOSIS — E662 Morbid (severe) obesity with alveolar hypoventilation: Secondary | ICD-10-CM | POA: Diagnosis not present

## 2019-03-14 DIAGNOSIS — G8929 Other chronic pain: Secondary | ICD-10-CM | POA: Diagnosis not present

## 2019-03-14 DIAGNOSIS — I890 Lymphedema, not elsewhere classified: Secondary | ICD-10-CM | POA: Diagnosis not present

## 2019-03-14 DIAGNOSIS — R2681 Unsteadiness on feet: Secondary | ICD-10-CM | POA: Diagnosis not present

## 2019-03-14 DIAGNOSIS — M8580 Other specified disorders of bone density and structure, unspecified site: Secondary | ICD-10-CM | POA: Diagnosis not present

## 2019-03-14 DIAGNOSIS — I872 Venous insufficiency (chronic) (peripheral): Secondary | ICD-10-CM | POA: Diagnosis not present

## 2019-03-14 DIAGNOSIS — I129 Hypertensive chronic kidney disease with stage 1 through stage 4 chronic kidney disease, or unspecified chronic kidney disease: Secondary | ICD-10-CM | POA: Diagnosis not present

## 2019-03-14 DIAGNOSIS — M1611 Unilateral primary osteoarthritis, right hip: Secondary | ICD-10-CM | POA: Diagnosis not present

## 2019-03-14 DIAGNOSIS — M109 Gout, unspecified: Secondary | ICD-10-CM | POA: Diagnosis not present

## 2019-03-14 DIAGNOSIS — G4733 Obstructive sleep apnea (adult) (pediatric): Secondary | ICD-10-CM | POA: Diagnosis not present

## 2019-03-14 DIAGNOSIS — Z6841 Body Mass Index (BMI) 40.0 and over, adult: Secondary | ICD-10-CM | POA: Diagnosis not present

## 2019-03-14 DIAGNOSIS — F329 Major depressive disorder, single episode, unspecified: Secondary | ICD-10-CM | POA: Diagnosis not present

## 2019-03-14 DIAGNOSIS — I87309 Chronic venous hypertension (idiopathic) without complications of unspecified lower extremity: Secondary | ICD-10-CM | POA: Diagnosis not present

## 2019-03-14 DIAGNOSIS — L03116 Cellulitis of left lower limb: Secondary | ICD-10-CM | POA: Diagnosis not present

## 2019-03-14 DIAGNOSIS — N184 Chronic kidney disease, stage 4 (severe): Secondary | ICD-10-CM | POA: Diagnosis not present

## 2019-03-14 DIAGNOSIS — M797 Fibromyalgia: Secondary | ICD-10-CM | POA: Diagnosis not present

## 2019-03-14 DIAGNOSIS — E785 Hyperlipidemia, unspecified: Secondary | ICD-10-CM | POA: Diagnosis not present

## 2019-03-14 DIAGNOSIS — E1122 Type 2 diabetes mellitus with diabetic chronic kidney disease: Secondary | ICD-10-CM | POA: Diagnosis not present

## 2019-03-14 DIAGNOSIS — M5386 Other specified dorsopathies, lumbar region: Secondary | ICD-10-CM | POA: Diagnosis not present

## 2019-03-14 DIAGNOSIS — Z96653 Presence of artificial knee joint, bilateral: Secondary | ICD-10-CM | POA: Diagnosis not present

## 2019-03-14 DIAGNOSIS — M19072 Primary osteoarthritis, left ankle and foot: Secondary | ICD-10-CM | POA: Diagnosis not present

## 2019-03-14 DIAGNOSIS — K219 Gastro-esophageal reflux disease without esophagitis: Secondary | ICD-10-CM | POA: Diagnosis not present

## 2019-03-14 DIAGNOSIS — E1151 Type 2 diabetes mellitus with diabetic peripheral angiopathy without gangrene: Secondary | ICD-10-CM | POA: Diagnosis not present

## 2019-03-18 DIAGNOSIS — M5386 Other specified dorsopathies, lumbar region: Secondary | ICD-10-CM | POA: Diagnosis not present

## 2019-03-18 DIAGNOSIS — E785 Hyperlipidemia, unspecified: Secondary | ICD-10-CM | POA: Diagnosis not present

## 2019-03-18 DIAGNOSIS — I890 Lymphedema, not elsewhere classified: Secondary | ICD-10-CM | POA: Diagnosis not present

## 2019-03-18 DIAGNOSIS — M109 Gout, unspecified: Secondary | ICD-10-CM | POA: Diagnosis not present

## 2019-03-18 DIAGNOSIS — Z96653 Presence of artificial knee joint, bilateral: Secondary | ICD-10-CM | POA: Diagnosis not present

## 2019-03-18 DIAGNOSIS — R2681 Unsteadiness on feet: Secondary | ICD-10-CM | POA: Diagnosis not present

## 2019-03-18 DIAGNOSIS — M797 Fibromyalgia: Secondary | ICD-10-CM | POA: Diagnosis not present

## 2019-03-18 DIAGNOSIS — F329 Major depressive disorder, single episode, unspecified: Secondary | ICD-10-CM | POA: Diagnosis not present

## 2019-03-18 DIAGNOSIS — E1122 Type 2 diabetes mellitus with diabetic chronic kidney disease: Secondary | ICD-10-CM | POA: Diagnosis not present

## 2019-03-18 DIAGNOSIS — E662 Morbid (severe) obesity with alveolar hypoventilation: Secondary | ICD-10-CM | POA: Diagnosis not present

## 2019-03-18 DIAGNOSIS — I129 Hypertensive chronic kidney disease with stage 1 through stage 4 chronic kidney disease, or unspecified chronic kidney disease: Secondary | ICD-10-CM | POA: Diagnosis not present

## 2019-03-18 DIAGNOSIS — G4733 Obstructive sleep apnea (adult) (pediatric): Secondary | ICD-10-CM | POA: Diagnosis not present

## 2019-03-18 DIAGNOSIS — N184 Chronic kidney disease, stage 4 (severe): Secondary | ICD-10-CM | POA: Diagnosis not present

## 2019-03-18 DIAGNOSIS — Z4800 Encounter for change or removal of nonsurgical wound dressing: Secondary | ICD-10-CM | POA: Diagnosis not present

## 2019-03-18 DIAGNOSIS — E1151 Type 2 diabetes mellitus with diabetic peripheral angiopathy without gangrene: Secondary | ICD-10-CM | POA: Diagnosis not present

## 2019-03-18 DIAGNOSIS — K219 Gastro-esophageal reflux disease without esophagitis: Secondary | ICD-10-CM | POA: Diagnosis not present

## 2019-03-18 DIAGNOSIS — I87309 Chronic venous hypertension (idiopathic) without complications of unspecified lower extremity: Secondary | ICD-10-CM | POA: Diagnosis not present

## 2019-03-18 DIAGNOSIS — L03116 Cellulitis of left lower limb: Secondary | ICD-10-CM | POA: Diagnosis not present

## 2019-03-18 DIAGNOSIS — G8929 Other chronic pain: Secondary | ICD-10-CM | POA: Diagnosis not present

## 2019-03-18 DIAGNOSIS — E1142 Type 2 diabetes mellitus with diabetic polyneuropathy: Secondary | ICD-10-CM | POA: Diagnosis not present

## 2019-03-18 DIAGNOSIS — Z6841 Body Mass Index (BMI) 40.0 and over, adult: Secondary | ICD-10-CM | POA: Diagnosis not present

## 2019-03-18 DIAGNOSIS — M1611 Unilateral primary osteoarthritis, right hip: Secondary | ICD-10-CM | POA: Diagnosis not present

## 2019-03-18 DIAGNOSIS — M19072 Primary osteoarthritis, left ankle and foot: Secondary | ICD-10-CM | POA: Diagnosis not present

## 2019-03-18 DIAGNOSIS — I872 Venous insufficiency (chronic) (peripheral): Secondary | ICD-10-CM | POA: Diagnosis not present

## 2019-03-18 DIAGNOSIS — M8580 Other specified disorders of bone density and structure, unspecified site: Secondary | ICD-10-CM | POA: Diagnosis not present

## 2019-03-19 ENCOUNTER — Ambulatory Visit: Payer: HMO | Admitting: Sports Medicine

## 2019-03-19 DIAGNOSIS — N184 Chronic kidney disease, stage 4 (severe): Secondary | ICD-10-CM | POA: Diagnosis not present

## 2019-03-19 DIAGNOSIS — I129 Hypertensive chronic kidney disease with stage 1 through stage 4 chronic kidney disease, or unspecified chronic kidney disease: Secondary | ICD-10-CM | POA: Diagnosis not present

## 2019-03-19 DIAGNOSIS — N2581 Secondary hyperparathyroidism of renal origin: Secondary | ICD-10-CM | POA: Diagnosis not present

## 2019-03-19 DIAGNOSIS — E1122 Type 2 diabetes mellitus with diabetic chronic kidney disease: Secondary | ICD-10-CM | POA: Diagnosis not present

## 2019-03-21 DIAGNOSIS — E1165 Type 2 diabetes mellitus with hyperglycemia: Secondary | ICD-10-CM | POA: Diagnosis not present

## 2019-03-21 DIAGNOSIS — G4733 Obstructive sleep apnea (adult) (pediatric): Secondary | ICD-10-CM | POA: Diagnosis not present

## 2019-03-21 DIAGNOSIS — M109 Gout, unspecified: Secondary | ICD-10-CM | POA: Diagnosis not present

## 2019-03-21 DIAGNOSIS — E662 Morbid (severe) obesity with alveolar hypoventilation: Secondary | ICD-10-CM | POA: Diagnosis not present

## 2019-03-21 DIAGNOSIS — G8929 Other chronic pain: Secondary | ICD-10-CM | POA: Diagnosis not present

## 2019-03-21 DIAGNOSIS — M797 Fibromyalgia: Secondary | ICD-10-CM | POA: Diagnosis not present

## 2019-03-21 DIAGNOSIS — I890 Lymphedema, not elsewhere classified: Secondary | ICD-10-CM | POA: Diagnosis not present

## 2019-03-21 DIAGNOSIS — K219 Gastro-esophageal reflux disease without esophagitis: Secondary | ICD-10-CM | POA: Diagnosis not present

## 2019-03-21 DIAGNOSIS — I87309 Chronic venous hypertension (idiopathic) without complications of unspecified lower extremity: Secondary | ICD-10-CM | POA: Diagnosis not present

## 2019-03-21 DIAGNOSIS — F329 Major depressive disorder, single episode, unspecified: Secondary | ICD-10-CM | POA: Diagnosis not present

## 2019-03-21 DIAGNOSIS — M8580 Other specified disorders of bone density and structure, unspecified site: Secondary | ICD-10-CM | POA: Diagnosis not present

## 2019-03-21 DIAGNOSIS — E1142 Type 2 diabetes mellitus with diabetic polyneuropathy: Secondary | ICD-10-CM | POA: Diagnosis not present

## 2019-03-21 DIAGNOSIS — Z6841 Body Mass Index (BMI) 40.0 and over, adult: Secondary | ICD-10-CM | POA: Diagnosis not present

## 2019-03-21 DIAGNOSIS — M5386 Other specified dorsopathies, lumbar region: Secondary | ICD-10-CM | POA: Diagnosis not present

## 2019-03-21 DIAGNOSIS — E785 Hyperlipidemia, unspecified: Secondary | ICD-10-CM | POA: Diagnosis not present

## 2019-03-21 DIAGNOSIS — N184 Chronic kidney disease, stage 4 (severe): Secondary | ICD-10-CM | POA: Diagnosis not present

## 2019-03-21 DIAGNOSIS — M1611 Unilateral primary osteoarthritis, right hip: Secondary | ICD-10-CM | POA: Diagnosis not present

## 2019-03-21 DIAGNOSIS — I129 Hypertensive chronic kidney disease with stage 1 through stage 4 chronic kidney disease, or unspecified chronic kidney disease: Secondary | ICD-10-CM | POA: Diagnosis not present

## 2019-03-21 DIAGNOSIS — Z96653 Presence of artificial knee joint, bilateral: Secondary | ICD-10-CM | POA: Diagnosis not present

## 2019-03-21 DIAGNOSIS — M19072 Primary osteoarthritis, left ankle and foot: Secondary | ICD-10-CM | POA: Diagnosis not present

## 2019-03-21 DIAGNOSIS — E1122 Type 2 diabetes mellitus with diabetic chronic kidney disease: Secondary | ICD-10-CM | POA: Diagnosis not present

## 2019-03-21 DIAGNOSIS — I10 Essential (primary) hypertension: Secondary | ICD-10-CM | POA: Diagnosis not present

## 2019-03-21 DIAGNOSIS — E1151 Type 2 diabetes mellitus with diabetic peripheral angiopathy without gangrene: Secondary | ICD-10-CM | POA: Diagnosis not present

## 2019-03-21 DIAGNOSIS — E7800 Pure hypercholesterolemia, unspecified: Secondary | ICD-10-CM | POA: Diagnosis not present

## 2019-03-21 DIAGNOSIS — R2681 Unsteadiness on feet: Secondary | ICD-10-CM | POA: Diagnosis not present

## 2019-03-21 DIAGNOSIS — L03116 Cellulitis of left lower limb: Secondary | ICD-10-CM | POA: Diagnosis not present

## 2019-03-21 DIAGNOSIS — I872 Venous insufficiency (chronic) (peripheral): Secondary | ICD-10-CM | POA: Diagnosis not present

## 2019-03-21 DIAGNOSIS — N189 Chronic kidney disease, unspecified: Secondary | ICD-10-CM | POA: Diagnosis not present

## 2019-03-21 DIAGNOSIS — Z4800 Encounter for change or removal of nonsurgical wound dressing: Secondary | ICD-10-CM | POA: Diagnosis not present

## 2019-03-27 DIAGNOSIS — E1151 Type 2 diabetes mellitus with diabetic peripheral angiopathy without gangrene: Secondary | ICD-10-CM | POA: Diagnosis not present

## 2019-03-27 DIAGNOSIS — I129 Hypertensive chronic kidney disease with stage 1 through stage 4 chronic kidney disease, or unspecified chronic kidney disease: Secondary | ICD-10-CM | POA: Diagnosis not present

## 2019-03-27 DIAGNOSIS — F329 Major depressive disorder, single episode, unspecified: Secondary | ICD-10-CM | POA: Diagnosis not present

## 2019-03-27 DIAGNOSIS — I890 Lymphedema, not elsewhere classified: Secondary | ICD-10-CM | POA: Diagnosis not present

## 2019-03-27 DIAGNOSIS — E1142 Type 2 diabetes mellitus with diabetic polyneuropathy: Secondary | ICD-10-CM | POA: Diagnosis not present

## 2019-03-27 DIAGNOSIS — R2681 Unsteadiness on feet: Secondary | ICD-10-CM | POA: Diagnosis not present

## 2019-03-27 DIAGNOSIS — I87309 Chronic venous hypertension (idiopathic) without complications of unspecified lower extremity: Secondary | ICD-10-CM | POA: Diagnosis not present

## 2019-03-27 DIAGNOSIS — M5386 Other specified dorsopathies, lumbar region: Secondary | ICD-10-CM | POA: Diagnosis not present

## 2019-03-27 DIAGNOSIS — E1122 Type 2 diabetes mellitus with diabetic chronic kidney disease: Secondary | ICD-10-CM | POA: Diagnosis not present

## 2019-03-27 DIAGNOSIS — L03116 Cellulitis of left lower limb: Secondary | ICD-10-CM | POA: Diagnosis not present

## 2019-03-27 DIAGNOSIS — G4733 Obstructive sleep apnea (adult) (pediatric): Secondary | ICD-10-CM | POA: Diagnosis not present

## 2019-03-27 DIAGNOSIS — Z4800 Encounter for change or removal of nonsurgical wound dressing: Secondary | ICD-10-CM | POA: Diagnosis not present

## 2019-03-27 DIAGNOSIS — M109 Gout, unspecified: Secondary | ICD-10-CM | POA: Diagnosis not present

## 2019-03-27 DIAGNOSIS — E785 Hyperlipidemia, unspecified: Secondary | ICD-10-CM | POA: Diagnosis not present

## 2019-03-27 DIAGNOSIS — E662 Morbid (severe) obesity with alveolar hypoventilation: Secondary | ICD-10-CM | POA: Diagnosis not present

## 2019-03-27 DIAGNOSIS — N184 Chronic kidney disease, stage 4 (severe): Secondary | ICD-10-CM | POA: Diagnosis not present

## 2019-03-27 DIAGNOSIS — I872 Venous insufficiency (chronic) (peripheral): Secondary | ICD-10-CM | POA: Diagnosis not present

## 2019-03-27 DIAGNOSIS — M19072 Primary osteoarthritis, left ankle and foot: Secondary | ICD-10-CM | POA: Diagnosis not present

## 2019-03-27 DIAGNOSIS — M797 Fibromyalgia: Secondary | ICD-10-CM | POA: Diagnosis not present

## 2019-03-27 DIAGNOSIS — Z6841 Body Mass Index (BMI) 40.0 and over, adult: Secondary | ICD-10-CM | POA: Diagnosis not present

## 2019-03-27 DIAGNOSIS — K219 Gastro-esophageal reflux disease without esophagitis: Secondary | ICD-10-CM | POA: Diagnosis not present

## 2019-03-27 DIAGNOSIS — Z96653 Presence of artificial knee joint, bilateral: Secondary | ICD-10-CM | POA: Diagnosis not present

## 2019-03-27 DIAGNOSIS — G8929 Other chronic pain: Secondary | ICD-10-CM | POA: Diagnosis not present

## 2019-03-27 DIAGNOSIS — M8580 Other specified disorders of bone density and structure, unspecified site: Secondary | ICD-10-CM | POA: Diagnosis not present

## 2019-03-27 DIAGNOSIS — M1611 Unilateral primary osteoarthritis, right hip: Secondary | ICD-10-CM | POA: Diagnosis not present

## 2019-04-02 DIAGNOSIS — Z4800 Encounter for change or removal of nonsurgical wound dressing: Secondary | ICD-10-CM | POA: Diagnosis not present

## 2019-04-02 DIAGNOSIS — G8929 Other chronic pain: Secondary | ICD-10-CM | POA: Diagnosis not present

## 2019-04-02 DIAGNOSIS — G4733 Obstructive sleep apnea (adult) (pediatric): Secondary | ICD-10-CM | POA: Diagnosis not present

## 2019-04-02 DIAGNOSIS — N184 Chronic kidney disease, stage 4 (severe): Secondary | ICD-10-CM | POA: Diagnosis not present

## 2019-04-02 DIAGNOSIS — Z6841 Body Mass Index (BMI) 40.0 and over, adult: Secondary | ICD-10-CM | POA: Diagnosis not present

## 2019-04-02 DIAGNOSIS — E662 Morbid (severe) obesity with alveolar hypoventilation: Secondary | ICD-10-CM | POA: Diagnosis not present

## 2019-04-02 DIAGNOSIS — K219 Gastro-esophageal reflux disease without esophagitis: Secondary | ICD-10-CM | POA: Diagnosis not present

## 2019-04-02 DIAGNOSIS — M8580 Other specified disorders of bone density and structure, unspecified site: Secondary | ICD-10-CM | POA: Diagnosis not present

## 2019-04-02 DIAGNOSIS — E1122 Type 2 diabetes mellitus with diabetic chronic kidney disease: Secondary | ICD-10-CM | POA: Diagnosis not present

## 2019-04-02 DIAGNOSIS — Z96653 Presence of artificial knee joint, bilateral: Secondary | ICD-10-CM | POA: Diagnosis not present

## 2019-04-02 DIAGNOSIS — L03116 Cellulitis of left lower limb: Secondary | ICD-10-CM | POA: Diagnosis not present

## 2019-04-02 DIAGNOSIS — I890 Lymphedema, not elsewhere classified: Secondary | ICD-10-CM | POA: Diagnosis not present

## 2019-04-02 DIAGNOSIS — M1611 Unilateral primary osteoarthritis, right hip: Secondary | ICD-10-CM | POA: Diagnosis not present

## 2019-04-02 DIAGNOSIS — M797 Fibromyalgia: Secondary | ICD-10-CM | POA: Diagnosis not present

## 2019-04-02 DIAGNOSIS — I129 Hypertensive chronic kidney disease with stage 1 through stage 4 chronic kidney disease, or unspecified chronic kidney disease: Secondary | ICD-10-CM | POA: Diagnosis not present

## 2019-04-02 DIAGNOSIS — E1142 Type 2 diabetes mellitus with diabetic polyneuropathy: Secondary | ICD-10-CM | POA: Diagnosis not present

## 2019-04-02 DIAGNOSIS — M109 Gout, unspecified: Secondary | ICD-10-CM | POA: Diagnosis not present

## 2019-04-02 DIAGNOSIS — I872 Venous insufficiency (chronic) (peripheral): Secondary | ICD-10-CM | POA: Diagnosis not present

## 2019-04-02 DIAGNOSIS — M19072 Primary osteoarthritis, left ankle and foot: Secondary | ICD-10-CM | POA: Diagnosis not present

## 2019-04-02 DIAGNOSIS — E1151 Type 2 diabetes mellitus with diabetic peripheral angiopathy without gangrene: Secondary | ICD-10-CM | POA: Diagnosis not present

## 2019-04-02 DIAGNOSIS — I87309 Chronic venous hypertension (idiopathic) without complications of unspecified lower extremity: Secondary | ICD-10-CM | POA: Diagnosis not present

## 2019-04-02 DIAGNOSIS — F329 Major depressive disorder, single episode, unspecified: Secondary | ICD-10-CM | POA: Diagnosis not present

## 2019-04-02 DIAGNOSIS — M5386 Other specified dorsopathies, lumbar region: Secondary | ICD-10-CM | POA: Diagnosis not present

## 2019-04-02 DIAGNOSIS — E785 Hyperlipidemia, unspecified: Secondary | ICD-10-CM | POA: Diagnosis not present

## 2019-04-02 DIAGNOSIS — R2681 Unsteadiness on feet: Secondary | ICD-10-CM | POA: Diagnosis not present

## 2019-04-07 DIAGNOSIS — I5033 Acute on chronic diastolic (congestive) heart failure: Secondary | ICD-10-CM | POA: Diagnosis not present

## 2019-04-07 DIAGNOSIS — J9602 Acute respiratory failure with hypercapnia: Secondary | ICD-10-CM | POA: Diagnosis not present

## 2019-04-12 DIAGNOSIS — Z6841 Body Mass Index (BMI) 40.0 and over, adult: Secondary | ICD-10-CM | POA: Diagnosis not present

## 2019-04-12 DIAGNOSIS — M1611 Unilateral primary osteoarthritis, right hip: Secondary | ICD-10-CM | POA: Diagnosis not present

## 2019-04-12 DIAGNOSIS — N184 Chronic kidney disease, stage 4 (severe): Secondary | ICD-10-CM | POA: Diagnosis not present

## 2019-04-12 DIAGNOSIS — G8929 Other chronic pain: Secondary | ICD-10-CM | POA: Diagnosis not present

## 2019-04-12 DIAGNOSIS — K219 Gastro-esophageal reflux disease without esophagitis: Secondary | ICD-10-CM | POA: Diagnosis not present

## 2019-04-12 DIAGNOSIS — E1122 Type 2 diabetes mellitus with diabetic chronic kidney disease: Secondary | ICD-10-CM | POA: Diagnosis not present

## 2019-04-12 DIAGNOSIS — E1151 Type 2 diabetes mellitus with diabetic peripheral angiopathy without gangrene: Secondary | ICD-10-CM | POA: Diagnosis not present

## 2019-04-12 DIAGNOSIS — G4733 Obstructive sleep apnea (adult) (pediatric): Secondary | ICD-10-CM | POA: Diagnosis not present

## 2019-04-12 DIAGNOSIS — M109 Gout, unspecified: Secondary | ICD-10-CM | POA: Diagnosis not present

## 2019-04-12 DIAGNOSIS — I129 Hypertensive chronic kidney disease with stage 1 through stage 4 chronic kidney disease, or unspecified chronic kidney disease: Secondary | ICD-10-CM | POA: Diagnosis not present

## 2019-04-12 DIAGNOSIS — I872 Venous insufficiency (chronic) (peripheral): Secondary | ICD-10-CM | POA: Diagnosis not present

## 2019-04-12 DIAGNOSIS — M19072 Primary osteoarthritis, left ankle and foot: Secondary | ICD-10-CM | POA: Diagnosis not present

## 2019-04-12 DIAGNOSIS — E1142 Type 2 diabetes mellitus with diabetic polyneuropathy: Secondary | ICD-10-CM | POA: Diagnosis not present

## 2019-04-12 DIAGNOSIS — I890 Lymphedema, not elsewhere classified: Secondary | ICD-10-CM | POA: Diagnosis not present

## 2019-04-12 DIAGNOSIS — Z4800 Encounter for change or removal of nonsurgical wound dressing: Secondary | ICD-10-CM | POA: Diagnosis not present

## 2019-04-12 DIAGNOSIS — M5386 Other specified dorsopathies, lumbar region: Secondary | ICD-10-CM | POA: Diagnosis not present

## 2019-04-12 DIAGNOSIS — M8580 Other specified disorders of bone density and structure, unspecified site: Secondary | ICD-10-CM | POA: Diagnosis not present

## 2019-04-12 DIAGNOSIS — E662 Morbid (severe) obesity with alveolar hypoventilation: Secondary | ICD-10-CM | POA: Diagnosis not present

## 2019-04-12 DIAGNOSIS — I87309 Chronic venous hypertension (idiopathic) without complications of unspecified lower extremity: Secondary | ICD-10-CM | POA: Diagnosis not present

## 2019-04-12 DIAGNOSIS — M797 Fibromyalgia: Secondary | ICD-10-CM | POA: Diagnosis not present

## 2019-04-12 DIAGNOSIS — E785 Hyperlipidemia, unspecified: Secondary | ICD-10-CM | POA: Diagnosis not present

## 2019-04-12 DIAGNOSIS — Z96653 Presence of artificial knee joint, bilateral: Secondary | ICD-10-CM | POA: Diagnosis not present

## 2019-04-12 DIAGNOSIS — R2681 Unsteadiness on feet: Secondary | ICD-10-CM | POA: Diagnosis not present

## 2019-04-12 DIAGNOSIS — F329 Major depressive disorder, single episode, unspecified: Secondary | ICD-10-CM | POA: Diagnosis not present

## 2019-04-12 DIAGNOSIS — L03116 Cellulitis of left lower limb: Secondary | ICD-10-CM | POA: Diagnosis not present

## 2019-04-18 DIAGNOSIS — I890 Lymphedema, not elsewhere classified: Secondary | ICD-10-CM | POA: Diagnosis not present

## 2019-04-18 DIAGNOSIS — Z9181 History of falling: Secondary | ICD-10-CM | POA: Diagnosis not present

## 2019-04-18 DIAGNOSIS — L03116 Cellulitis of left lower limb: Secondary | ICD-10-CM | POA: Diagnosis not present

## 2019-04-18 DIAGNOSIS — M1990 Unspecified osteoarthritis, unspecified site: Secondary | ICD-10-CM | POA: Diagnosis not present

## 2019-04-18 DIAGNOSIS — S31109A Unspecified open wound of abdominal wall, unspecified quadrant without penetration into peritoneal cavity, initial encounter: Secondary | ICD-10-CM | POA: Diagnosis not present

## 2019-04-18 DIAGNOSIS — M1631 Unilateral osteoarthritis resulting from hip dysplasia, right hip: Secondary | ICD-10-CM | POA: Diagnosis not present

## 2019-04-22 DIAGNOSIS — G4733 Obstructive sleep apnea (adult) (pediatric): Secondary | ICD-10-CM | POA: Diagnosis not present

## 2019-04-22 DIAGNOSIS — E785 Hyperlipidemia, unspecified: Secondary | ICD-10-CM | POA: Diagnosis not present

## 2019-04-22 DIAGNOSIS — M1611 Unilateral primary osteoarthritis, right hip: Secondary | ICD-10-CM | POA: Diagnosis not present

## 2019-04-22 DIAGNOSIS — I890 Lymphedema, not elsewhere classified: Secondary | ICD-10-CM | POA: Diagnosis not present

## 2019-04-22 DIAGNOSIS — Z96653 Presence of artificial knee joint, bilateral: Secondary | ICD-10-CM | POA: Diagnosis not present

## 2019-04-22 DIAGNOSIS — E1151 Type 2 diabetes mellitus with diabetic peripheral angiopathy without gangrene: Secondary | ICD-10-CM | POA: Diagnosis not present

## 2019-04-22 DIAGNOSIS — E1142 Type 2 diabetes mellitus with diabetic polyneuropathy: Secondary | ICD-10-CM | POA: Diagnosis not present

## 2019-04-22 DIAGNOSIS — G8929 Other chronic pain: Secondary | ICD-10-CM | POA: Diagnosis not present

## 2019-04-22 DIAGNOSIS — F329 Major depressive disorder, single episode, unspecified: Secondary | ICD-10-CM | POA: Diagnosis not present

## 2019-04-22 DIAGNOSIS — N184 Chronic kidney disease, stage 4 (severe): Secondary | ICD-10-CM | POA: Diagnosis not present

## 2019-04-22 DIAGNOSIS — E662 Morbid (severe) obesity with alveolar hypoventilation: Secondary | ICD-10-CM | POA: Diagnosis not present

## 2019-04-22 DIAGNOSIS — R2681 Unsteadiness on feet: Secondary | ICD-10-CM | POA: Diagnosis not present

## 2019-04-22 DIAGNOSIS — Z6841 Body Mass Index (BMI) 40.0 and over, adult: Secondary | ICD-10-CM | POA: Diagnosis not present

## 2019-04-22 DIAGNOSIS — K219 Gastro-esophageal reflux disease without esophagitis: Secondary | ICD-10-CM | POA: Diagnosis not present

## 2019-04-22 DIAGNOSIS — M8580 Other specified disorders of bone density and structure, unspecified site: Secondary | ICD-10-CM | POA: Diagnosis not present

## 2019-04-22 DIAGNOSIS — Z4800 Encounter for change or removal of nonsurgical wound dressing: Secondary | ICD-10-CM | POA: Diagnosis not present

## 2019-04-22 DIAGNOSIS — E1122 Type 2 diabetes mellitus with diabetic chronic kidney disease: Secondary | ICD-10-CM | POA: Diagnosis not present

## 2019-04-22 DIAGNOSIS — I872 Venous insufficiency (chronic) (peripheral): Secondary | ICD-10-CM | POA: Diagnosis not present

## 2019-04-22 DIAGNOSIS — M5386 Other specified dorsopathies, lumbar region: Secondary | ICD-10-CM | POA: Diagnosis not present

## 2019-04-22 DIAGNOSIS — M109 Gout, unspecified: Secondary | ICD-10-CM | POA: Diagnosis not present

## 2019-04-22 DIAGNOSIS — I87309 Chronic venous hypertension (idiopathic) without complications of unspecified lower extremity: Secondary | ICD-10-CM | POA: Diagnosis not present

## 2019-04-22 DIAGNOSIS — M797 Fibromyalgia: Secondary | ICD-10-CM | POA: Diagnosis not present

## 2019-04-22 DIAGNOSIS — L03116 Cellulitis of left lower limb: Secondary | ICD-10-CM | POA: Diagnosis not present

## 2019-04-22 DIAGNOSIS — M19072 Primary osteoarthritis, left ankle and foot: Secondary | ICD-10-CM | POA: Diagnosis not present

## 2019-04-22 DIAGNOSIS — I129 Hypertensive chronic kidney disease with stage 1 through stage 4 chronic kidney disease, or unspecified chronic kidney disease: Secondary | ICD-10-CM | POA: Diagnosis not present

## 2019-04-30 DIAGNOSIS — M1631 Unilateral osteoarthritis resulting from hip dysplasia, right hip: Secondary | ICD-10-CM | POA: Diagnosis not present

## 2019-04-30 DIAGNOSIS — Z9181 History of falling: Secondary | ICD-10-CM | POA: Diagnosis not present

## 2019-04-30 DIAGNOSIS — L03116 Cellulitis of left lower limb: Secondary | ICD-10-CM | POA: Diagnosis not present

## 2019-04-30 DIAGNOSIS — M1990 Unspecified osteoarthritis, unspecified site: Secondary | ICD-10-CM | POA: Diagnosis not present

## 2019-04-30 DIAGNOSIS — S31109A Unspecified open wound of abdominal wall, unspecified quadrant without penetration into peritoneal cavity, initial encounter: Secondary | ICD-10-CM | POA: Diagnosis not present

## 2019-04-30 DIAGNOSIS — I890 Lymphedema, not elsewhere classified: Secondary | ICD-10-CM | POA: Diagnosis not present

## 2019-04-30 DIAGNOSIS — N184 Chronic kidney disease, stage 4 (severe): Secondary | ICD-10-CM | POA: Diagnosis not present

## 2019-05-01 DIAGNOSIS — G8929 Other chronic pain: Secondary | ICD-10-CM | POA: Diagnosis not present

## 2019-05-01 DIAGNOSIS — L03116 Cellulitis of left lower limb: Secondary | ICD-10-CM | POA: Diagnosis not present

## 2019-05-01 DIAGNOSIS — M8580 Other specified disorders of bone density and structure, unspecified site: Secondary | ICD-10-CM | POA: Diagnosis not present

## 2019-05-01 DIAGNOSIS — I890 Lymphedema, not elsewhere classified: Secondary | ICD-10-CM | POA: Diagnosis not present

## 2019-05-01 DIAGNOSIS — G4733 Obstructive sleep apnea (adult) (pediatric): Secondary | ICD-10-CM | POA: Diagnosis not present

## 2019-05-01 DIAGNOSIS — I87309 Chronic venous hypertension (idiopathic) without complications of unspecified lower extremity: Secondary | ICD-10-CM | POA: Diagnosis not present

## 2019-05-01 DIAGNOSIS — I129 Hypertensive chronic kidney disease with stage 1 through stage 4 chronic kidney disease, or unspecified chronic kidney disease: Secondary | ICD-10-CM | POA: Diagnosis not present

## 2019-05-01 DIAGNOSIS — R2681 Unsteadiness on feet: Secondary | ICD-10-CM | POA: Diagnosis not present

## 2019-05-01 DIAGNOSIS — M797 Fibromyalgia: Secondary | ICD-10-CM | POA: Diagnosis not present

## 2019-05-01 DIAGNOSIS — N184 Chronic kidney disease, stage 4 (severe): Secondary | ICD-10-CM | POA: Diagnosis not present

## 2019-05-01 DIAGNOSIS — M5386 Other specified dorsopathies, lumbar region: Secondary | ICD-10-CM | POA: Diagnosis not present

## 2019-05-01 DIAGNOSIS — M19072 Primary osteoarthritis, left ankle and foot: Secondary | ICD-10-CM | POA: Diagnosis not present

## 2019-05-01 DIAGNOSIS — M1611 Unilateral primary osteoarthritis, right hip: Secondary | ICD-10-CM | POA: Diagnosis not present

## 2019-05-01 DIAGNOSIS — Z6841 Body Mass Index (BMI) 40.0 and over, adult: Secondary | ICD-10-CM | POA: Diagnosis not present

## 2019-05-01 DIAGNOSIS — M109 Gout, unspecified: Secondary | ICD-10-CM | POA: Diagnosis not present

## 2019-05-01 DIAGNOSIS — E662 Morbid (severe) obesity with alveolar hypoventilation: Secondary | ICD-10-CM | POA: Diagnosis not present

## 2019-05-01 DIAGNOSIS — Z96653 Presence of artificial knee joint, bilateral: Secondary | ICD-10-CM | POA: Diagnosis not present

## 2019-05-01 DIAGNOSIS — K219 Gastro-esophageal reflux disease without esophagitis: Secondary | ICD-10-CM | POA: Diagnosis not present

## 2019-05-01 DIAGNOSIS — Z4800 Encounter for change or removal of nonsurgical wound dressing: Secondary | ICD-10-CM | POA: Diagnosis not present

## 2019-05-01 DIAGNOSIS — E1122 Type 2 diabetes mellitus with diabetic chronic kidney disease: Secondary | ICD-10-CM | POA: Diagnosis not present

## 2019-05-01 DIAGNOSIS — E1151 Type 2 diabetes mellitus with diabetic peripheral angiopathy without gangrene: Secondary | ICD-10-CM | POA: Diagnosis not present

## 2019-05-01 DIAGNOSIS — E1142 Type 2 diabetes mellitus with diabetic polyneuropathy: Secondary | ICD-10-CM | POA: Diagnosis not present

## 2019-05-01 DIAGNOSIS — F329 Major depressive disorder, single episode, unspecified: Secondary | ICD-10-CM | POA: Diagnosis not present

## 2019-05-01 DIAGNOSIS — E785 Hyperlipidemia, unspecified: Secondary | ICD-10-CM | POA: Diagnosis not present

## 2019-05-01 DIAGNOSIS — I872 Venous insufficiency (chronic) (peripheral): Secondary | ICD-10-CM | POA: Diagnosis not present

## 2019-05-07 DIAGNOSIS — R2681 Unsteadiness on feet: Secondary | ICD-10-CM | POA: Diagnosis not present

## 2019-05-07 DIAGNOSIS — K219 Gastro-esophageal reflux disease without esophagitis: Secondary | ICD-10-CM | POA: Diagnosis not present

## 2019-05-07 DIAGNOSIS — J9602 Acute respiratory failure with hypercapnia: Secondary | ICD-10-CM | POA: Diagnosis not present

## 2019-05-07 DIAGNOSIS — M19072 Primary osteoarthritis, left ankle and foot: Secondary | ICD-10-CM | POA: Diagnosis not present

## 2019-05-07 DIAGNOSIS — M797 Fibromyalgia: Secondary | ICD-10-CM | POA: Diagnosis not present

## 2019-05-07 DIAGNOSIS — Z6841 Body Mass Index (BMI) 40.0 and over, adult: Secondary | ICD-10-CM | POA: Diagnosis not present

## 2019-05-07 DIAGNOSIS — G4733 Obstructive sleep apnea (adult) (pediatric): Secondary | ICD-10-CM | POA: Diagnosis not present

## 2019-05-07 DIAGNOSIS — E662 Morbid (severe) obesity with alveolar hypoventilation: Secondary | ICD-10-CM | POA: Diagnosis not present

## 2019-05-07 DIAGNOSIS — E1151 Type 2 diabetes mellitus with diabetic peripheral angiopathy without gangrene: Secondary | ICD-10-CM | POA: Diagnosis not present

## 2019-05-07 DIAGNOSIS — Z96653 Presence of artificial knee joint, bilateral: Secondary | ICD-10-CM | POA: Diagnosis not present

## 2019-05-07 DIAGNOSIS — F329 Major depressive disorder, single episode, unspecified: Secondary | ICD-10-CM | POA: Diagnosis not present

## 2019-05-07 DIAGNOSIS — I5033 Acute on chronic diastolic (congestive) heart failure: Secondary | ICD-10-CM | POA: Diagnosis not present

## 2019-05-07 DIAGNOSIS — I87309 Chronic venous hypertension (idiopathic) without complications of unspecified lower extremity: Secondary | ICD-10-CM | POA: Diagnosis not present

## 2019-05-07 DIAGNOSIS — N184 Chronic kidney disease, stage 4 (severe): Secondary | ICD-10-CM | POA: Diagnosis not present

## 2019-05-07 DIAGNOSIS — E1142 Type 2 diabetes mellitus with diabetic polyneuropathy: Secondary | ICD-10-CM | POA: Diagnosis not present

## 2019-05-07 DIAGNOSIS — I890 Lymphedema, not elsewhere classified: Secondary | ICD-10-CM | POA: Diagnosis not present

## 2019-05-07 DIAGNOSIS — M5386 Other specified dorsopathies, lumbar region: Secondary | ICD-10-CM | POA: Diagnosis not present

## 2019-05-07 DIAGNOSIS — I129 Hypertensive chronic kidney disease with stage 1 through stage 4 chronic kidney disease, or unspecified chronic kidney disease: Secondary | ICD-10-CM | POA: Diagnosis not present

## 2019-05-07 DIAGNOSIS — I872 Venous insufficiency (chronic) (peripheral): Secondary | ICD-10-CM | POA: Diagnosis not present

## 2019-05-07 DIAGNOSIS — M1611 Unilateral primary osteoarthritis, right hip: Secondary | ICD-10-CM | POA: Diagnosis not present

## 2019-05-07 DIAGNOSIS — Z4800 Encounter for change or removal of nonsurgical wound dressing: Secondary | ICD-10-CM | POA: Diagnosis not present

## 2019-05-07 DIAGNOSIS — E785 Hyperlipidemia, unspecified: Secondary | ICD-10-CM | POA: Diagnosis not present

## 2019-05-07 DIAGNOSIS — E1122 Type 2 diabetes mellitus with diabetic chronic kidney disease: Secondary | ICD-10-CM | POA: Diagnosis not present

## 2019-05-07 DIAGNOSIS — G8929 Other chronic pain: Secondary | ICD-10-CM | POA: Diagnosis not present

## 2019-05-07 DIAGNOSIS — M109 Gout, unspecified: Secondary | ICD-10-CM | POA: Diagnosis not present

## 2019-05-07 DIAGNOSIS — M8580 Other specified disorders of bone density and structure, unspecified site: Secondary | ICD-10-CM | POA: Diagnosis not present

## 2019-05-07 DIAGNOSIS — L03116 Cellulitis of left lower limb: Secondary | ICD-10-CM | POA: Diagnosis not present

## 2019-05-08 DIAGNOSIS — I890 Lymphedema, not elsewhere classified: Secondary | ICD-10-CM | POA: Diagnosis not present

## 2019-05-08 DIAGNOSIS — G8929 Other chronic pain: Secondary | ICD-10-CM | POA: Diagnosis not present

## 2019-05-08 DIAGNOSIS — N184 Chronic kidney disease, stage 4 (severe): Secondary | ICD-10-CM | POA: Diagnosis not present

## 2019-05-08 DIAGNOSIS — G4733 Obstructive sleep apnea (adult) (pediatric): Secondary | ICD-10-CM | POA: Diagnosis not present

## 2019-05-08 DIAGNOSIS — I87309 Chronic venous hypertension (idiopathic) without complications of unspecified lower extremity: Secondary | ICD-10-CM | POA: Diagnosis not present

## 2019-05-08 DIAGNOSIS — Z4800 Encounter for change or removal of nonsurgical wound dressing: Secondary | ICD-10-CM | POA: Diagnosis not present

## 2019-05-08 DIAGNOSIS — M8580 Other specified disorders of bone density and structure, unspecified site: Secondary | ICD-10-CM | POA: Diagnosis not present

## 2019-05-08 DIAGNOSIS — M19072 Primary osteoarthritis, left ankle and foot: Secondary | ICD-10-CM | POA: Diagnosis not present

## 2019-05-08 DIAGNOSIS — M109 Gout, unspecified: Secondary | ICD-10-CM | POA: Diagnosis not present

## 2019-05-08 DIAGNOSIS — I872 Venous insufficiency (chronic) (peripheral): Secondary | ICD-10-CM | POA: Diagnosis not present

## 2019-05-08 DIAGNOSIS — M5386 Other specified dorsopathies, lumbar region: Secondary | ICD-10-CM | POA: Diagnosis not present

## 2019-05-08 DIAGNOSIS — Z6841 Body Mass Index (BMI) 40.0 and over, adult: Secondary | ICD-10-CM | POA: Diagnosis not present

## 2019-05-08 DIAGNOSIS — R2681 Unsteadiness on feet: Secondary | ICD-10-CM | POA: Diagnosis not present

## 2019-05-08 DIAGNOSIS — Z96653 Presence of artificial knee joint, bilateral: Secondary | ICD-10-CM | POA: Diagnosis not present

## 2019-05-08 DIAGNOSIS — F329 Major depressive disorder, single episode, unspecified: Secondary | ICD-10-CM | POA: Diagnosis not present

## 2019-05-08 DIAGNOSIS — E1142 Type 2 diabetes mellitus with diabetic polyneuropathy: Secondary | ICD-10-CM | POA: Diagnosis not present

## 2019-05-08 DIAGNOSIS — E1122 Type 2 diabetes mellitus with diabetic chronic kidney disease: Secondary | ICD-10-CM | POA: Diagnosis not present

## 2019-05-08 DIAGNOSIS — E785 Hyperlipidemia, unspecified: Secondary | ICD-10-CM | POA: Diagnosis not present

## 2019-05-08 DIAGNOSIS — E662 Morbid (severe) obesity with alveolar hypoventilation: Secondary | ICD-10-CM | POA: Diagnosis not present

## 2019-05-08 DIAGNOSIS — L03116 Cellulitis of left lower limb: Secondary | ICD-10-CM | POA: Diagnosis not present

## 2019-05-08 DIAGNOSIS — M1611 Unilateral primary osteoarthritis, right hip: Secondary | ICD-10-CM | POA: Diagnosis not present

## 2019-05-08 DIAGNOSIS — I129 Hypertensive chronic kidney disease with stage 1 through stage 4 chronic kidney disease, or unspecified chronic kidney disease: Secondary | ICD-10-CM | POA: Diagnosis not present

## 2019-05-08 DIAGNOSIS — M797 Fibromyalgia: Secondary | ICD-10-CM | POA: Diagnosis not present

## 2019-05-08 DIAGNOSIS — E1151 Type 2 diabetes mellitus with diabetic peripheral angiopathy without gangrene: Secondary | ICD-10-CM | POA: Diagnosis not present

## 2019-05-08 DIAGNOSIS — K219 Gastro-esophageal reflux disease without esophagitis: Secondary | ICD-10-CM | POA: Diagnosis not present

## 2019-05-09 NOTE — Patient Outreach (Signed)
  [facility]) Care Management Chronic Special Needs Program    05/09/2019  Name: Jean Mcclain, DOB: [birth year]  MRN: [id number]   Ms. Jean Mcclain is enrolled in a chronic special needs plan for Diabetes.  Client is actively  engaged with [facility] community  Client called nurse advise line on 7/29.  Reports she is having difficulty affording her Eliquis Secure message sent to [facility] about client's concern about cost of medications. Plan to continue to coordinate care as needed with [facility] community management as scheduled. Peter Garter RN, Jackquline [place], CDE Chronic Care Management Coordinator [place] [facility] [facility] Care Management [phone]

## 2019-05-14 DIAGNOSIS — N2581 Secondary hyperparathyroidism of renal origin: Secondary | ICD-10-CM | POA: Diagnosis not present

## 2019-05-14 DIAGNOSIS — N184 Chronic kidney disease, stage 4 (severe): Secondary | ICD-10-CM | POA: Diagnosis not present

## 2019-05-14 DIAGNOSIS — I129 Hypertensive chronic kidney disease with stage 1 through stage 4 chronic kidney disease, or unspecified chronic kidney disease: Secondary | ICD-10-CM | POA: Diagnosis not present

## 2019-05-14 DIAGNOSIS — E1122 Type 2 diabetes mellitus with diabetic chronic kidney disease: Secondary | ICD-10-CM | POA: Diagnosis not present

## 2019-05-17 DIAGNOSIS — I129 Hypertensive chronic kidney disease with stage 1 through stage 4 chronic kidney disease, or unspecified chronic kidney disease: Secondary | ICD-10-CM | POA: Diagnosis not present

## 2019-05-17 DIAGNOSIS — K219 Gastro-esophageal reflux disease without esophagitis: Secondary | ICD-10-CM | POA: Diagnosis not present

## 2019-05-17 DIAGNOSIS — Z96653 Presence of artificial knee joint, bilateral: Secondary | ICD-10-CM | POA: Diagnosis not present

## 2019-05-17 DIAGNOSIS — I87309 Chronic venous hypertension (idiopathic) without complications of unspecified lower extremity: Secondary | ICD-10-CM | POA: Diagnosis not present

## 2019-05-17 DIAGNOSIS — M19072 Primary osteoarthritis, left ankle and foot: Secondary | ICD-10-CM | POA: Diagnosis not present

## 2019-05-17 DIAGNOSIS — L03116 Cellulitis of left lower limb: Secondary | ICD-10-CM | POA: Diagnosis not present

## 2019-05-17 DIAGNOSIS — Z6841 Body Mass Index (BMI) 40.0 and over, adult: Secondary | ICD-10-CM | POA: Diagnosis not present

## 2019-05-17 DIAGNOSIS — M8580 Other specified disorders of bone density and structure, unspecified site: Secondary | ICD-10-CM | POA: Diagnosis not present

## 2019-05-17 DIAGNOSIS — F329 Major depressive disorder, single episode, unspecified: Secondary | ICD-10-CM | POA: Diagnosis not present

## 2019-05-17 DIAGNOSIS — E662 Morbid (severe) obesity with alveolar hypoventilation: Secondary | ICD-10-CM | POA: Diagnosis not present

## 2019-05-17 DIAGNOSIS — E1122 Type 2 diabetes mellitus with diabetic chronic kidney disease: Secondary | ICD-10-CM | POA: Diagnosis not present

## 2019-05-17 DIAGNOSIS — E1142 Type 2 diabetes mellitus with diabetic polyneuropathy: Secondary | ICD-10-CM | POA: Diagnosis not present

## 2019-05-17 DIAGNOSIS — M1611 Unilateral primary osteoarthritis, right hip: Secondary | ICD-10-CM | POA: Diagnosis not present

## 2019-05-17 DIAGNOSIS — M5386 Other specified dorsopathies, lumbar region: Secondary | ICD-10-CM | POA: Diagnosis not present

## 2019-05-17 DIAGNOSIS — I872 Venous insufficiency (chronic) (peripheral): Secondary | ICD-10-CM | POA: Diagnosis not present

## 2019-05-17 DIAGNOSIS — M109 Gout, unspecified: Secondary | ICD-10-CM | POA: Diagnosis not present

## 2019-05-17 DIAGNOSIS — E1151 Type 2 diabetes mellitus with diabetic peripheral angiopathy without gangrene: Secondary | ICD-10-CM | POA: Diagnosis not present

## 2019-05-17 DIAGNOSIS — G4733 Obstructive sleep apnea (adult) (pediatric): Secondary | ICD-10-CM | POA: Diagnosis not present

## 2019-05-17 DIAGNOSIS — Z4800 Encounter for change or removal of nonsurgical wound dressing: Secondary | ICD-10-CM | POA: Diagnosis not present

## 2019-05-17 DIAGNOSIS — M797 Fibromyalgia: Secondary | ICD-10-CM | POA: Diagnosis not present

## 2019-05-17 DIAGNOSIS — G8929 Other chronic pain: Secondary | ICD-10-CM | POA: Diagnosis not present

## 2019-05-17 DIAGNOSIS — N184 Chronic kidney disease, stage 4 (severe): Secondary | ICD-10-CM | POA: Diagnosis not present

## 2019-05-17 DIAGNOSIS — E785 Hyperlipidemia, unspecified: Secondary | ICD-10-CM | POA: Diagnosis not present

## 2019-05-17 DIAGNOSIS — R2681 Unsteadiness on feet: Secondary | ICD-10-CM | POA: Diagnosis not present

## 2019-05-17 DIAGNOSIS — I890 Lymphedema, not elsewhere classified: Secondary | ICD-10-CM | POA: Diagnosis not present

## 2019-05-29 DIAGNOSIS — S31109A Unspecified open wound of abdominal wall, unspecified quadrant without penetration into peritoneal cavity, initial encounter: Secondary | ICD-10-CM | POA: Diagnosis not present

## 2019-05-29 DIAGNOSIS — Z9181 History of falling: Secondary | ICD-10-CM | POA: Diagnosis not present

## 2019-05-29 DIAGNOSIS — L03116 Cellulitis of left lower limb: Secondary | ICD-10-CM | POA: Diagnosis not present

## 2019-05-29 DIAGNOSIS — I890 Lymphedema, not elsewhere classified: Secondary | ICD-10-CM | POA: Diagnosis not present

## 2019-05-29 DIAGNOSIS — M1990 Unspecified osteoarthritis, unspecified site: Secondary | ICD-10-CM | POA: Diagnosis not present

## 2019-05-29 DIAGNOSIS — M1631 Unilateral osteoarthritis resulting from hip dysplasia, right hip: Secondary | ICD-10-CM | POA: Diagnosis not present

## 2019-05-29 DIAGNOSIS — N184 Chronic kidney disease, stage 4 (severe): Secondary | ICD-10-CM | POA: Diagnosis not present

## 2019-06-07 DIAGNOSIS — J9602 Acute respiratory failure with hypercapnia: Secondary | ICD-10-CM | POA: Diagnosis not present

## 2019-06-07 DIAGNOSIS — I5033 Acute on chronic diastolic (congestive) heart failure: Secondary | ICD-10-CM | POA: Diagnosis not present

## 2019-06-25 ENCOUNTER — Ambulatory Visit (INDEPENDENT_AMBULATORY_CARE_PROVIDER_SITE_OTHER): Payer: HMO | Admitting: Sports Medicine

## 2019-06-25 ENCOUNTER — Other Ambulatory Visit: Payer: Self-pay

## 2019-06-25 ENCOUNTER — Encounter: Payer: Self-pay | Admitting: Sports Medicine

## 2019-06-25 DIAGNOSIS — I890 Lymphedema, not elsewhere classified: Secondary | ICD-10-CM | POA: Diagnosis not present

## 2019-06-25 DIAGNOSIS — M79676 Pain in unspecified toe(s): Secondary | ICD-10-CM

## 2019-06-25 DIAGNOSIS — E0842 Diabetes mellitus due to underlying condition with diabetic polyneuropathy: Secondary | ICD-10-CM

## 2019-06-25 DIAGNOSIS — B351 Tinea unguium: Secondary | ICD-10-CM

## 2019-06-25 NOTE — Progress Notes (Signed)
Patient ID: Jean Mcclain, female   DOB: 08-11-1946, 73 y.o.   MRN: [id number]   Subjective: Jean Mcclain is a 73 y.o. female patient with history of diabetes who returns to office today complaining of long, painful nails  while ambulating in shoes; unable to trim. Patient states that the glucose reading this morning was not recorded and reports that wound care nurse noticed that nails were long and that the great toenail on left may be lifting but it does not work.  Patient denies any other pedal complaints at this time.  Patient Active Problem List   Diagnosis Date Noted  . OSA (obstructive sleep apnea) 03/13/2019  . Acute respiratory distress   . Acute on chronic diastolic CHF (congestive heart failure) ([place])   . AKI (acute kidney injury) ([place]) 03/30/2017  . Sepsis ([place]) 03/30/2017  . Acute deep vein thrombosis (DVT) of popliteal vein of left lower extremity ([place]) 03/11/2017  . Renal insufficiency 03/11/2017  . Insulin-requiring or dependent type II diabetes mellitus ([place]) 03/11/2017  . Essential hypertension 03/11/2017  . Impingement syndrome of left ankle 01/31/2017  . Posterior tibial tendinitis, left leg 01/31/2017  . Incarcerated ventral hernia 01/02/2013   Current Outpatient Medications on File Prior to Visit  Medication Sig Dispense Refill  . acetaminophen (TYLENOL) 500 MG tablet Take 1,000 mg by mouth 3 (three) times daily as needed for moderate pain or headache.    Marland Kitchen apixaban (ELIQUIS) 2.5 MG TABS tablet Take 2.5 mg by mouth 2 (two) times daily.    Marland Kitchen apixaban (ELIQUIS) 5 MG TABS tablet Take 2 tablets (10 mg total) by mouth 2 (two) times daily. Then on 03/17/17, take 1 tabet (5 mg) two times daily. 69 tablet 0  . calcitRIOL (ROCALTROL) 0.25 MCG capsule Take 0.25 mcg by mouth daily.    . cephALEXin (KEFLEX) 500 MG capsule     . colchicine 0.6 MG tablet Take 0.6 mg by mouth daily as needed (gout flare).     . cyclobenzaprine (FLEXERIL) 10 MG tablet Take 10 mg by mouth at  bedtime.     . ergocalciferol (VITAMIN D2) 50000 UNITS capsule Take 50,000 Units by mouth every Friday.     . febuxostat (ULORIC) 40 MG tablet Take 40 mg by mouth daily.    . furosemide (LASIX) 80 MG tablet Take 80 mg by mouth See admin instructions. Take 80 mg by mouth once daily, may take a second 80 mg dose as needed for swelling    . glucose blood test strip OneTouch Verio test strips    . hydrOXYzine (ATARAX/VISTARIL) 50 MG tablet     . insulin NPH Human (HUMULIN N,NOVOLIN N) 100 UNIT/ML injection Inject 55 Units into the skin 2 (two) times daily.     . insulin regular (NOVOLIN R,HUMULIN R) 100 units/mL injection Inject 10-30 Units into the skin 3 (three) times daily before meals.     . Lancets (ONETOUCH DELICA PLUS [id number]) MISC     . lidocaine (LIDODERM) 5 % Place 1 patch onto the skin daily. Remove & Discard patch within 12 hours or as directed by MD 30 patch 0  . lovastatin (MEVACOR) 40 MG tablet Take 40 mg by mouth at bedtime.     . metoprolol succinate (TOPROL-XL) 25 MG 24 hr tablet Take 25 mg by mouth daily.     . nitrofurantoin, macrocrystal-monohydrate, (MACROBID) 100 MG capsule     . NYAMYC powder     . omeprazole (PRILOSEC) 20 MG capsule Take 20  mg by mouth daily.    Marland Kitchen oxyCODONE (ROXICODONE) 5 MG immediate release tablet Take 1 tablet (5 mg total) by mouth every 4 (four) hours as needed. 15 tablet 0  . predniSONE (DELTASONE) 20 MG tablet     . rOPINIRole (REQUIP) 4 MG tablet Take 4 mg by mouth 2 (two) times daily.     Marland Kitchen triamcinolone cream (KENALOG) 0.1 %      Current Facility-Administered Medications on File Prior to Visit  Medication Dose Route Frequency Provider Last Rate Last Dose  . betamethasone acetate-betamethasone sodium phosphate (CELESTONE) injection 3 mg  3 mg Intramuscular Once Edrick Kins, DPM       No Known Allergies  No results found for this or any previous visit (from the past 2160 hour(s)).  Objective: General: Patient is awake, alert, and oriented  x 3  Integument: Nails are tender, long, thickened and dystrophic with subungual debris, consistent with onychomycosis, 1-5 bilateral. Compression wrap noted to bilateral lower extremities with help from wound care center.  Remaining integument unremarkable.  Vasculature:  Dorsalis Pedis pulse 0/4 bilateral. Posterior Tibial pulse  0/4 bilateral unna boots/wraps bilateral.  Neurology:Protective sensation diminished bilateral.   Musculoskeletal: Unchanged foot deformity. No tenderness with calf compression bilateral.  No acute signs of DVT.  Assessment and Plan: Problem List Items Addressed This Visit    None    Visit Diagnoses    Pain due to onychomycosis of toenail    -  Primary   Relevant Medications   cephALEXin (KEFLEX) 500 MG capsule   NYAMYC powder   Diabetes mellitus due to underlying condition with diabetic polyneuropathy, unspecified whether long term insulin use (HCC)       Relevant Medications   hydrOXYzine (ATARAX/VISTARIL) 50 MG tablet   Lymphedema         -Examined patient. -Discussed and educated patient on diabetic foot care, especially with  regards to the vascular, neurological and musculoskeletal systems.  -Mechanically debrided all nails 1-5 bilateral using sterile nail nipper and filed with dremel without incident  -Continue with wound care center for unna boots bilateral -Patient to return in 3 months for at risk foot care -Patient advised to call the office if any problems or questions arise in the meantime.  Landis Martins, DPM

## 2019-06-26 DIAGNOSIS — I129 Hypertensive chronic kidney disease with stage 1 through stage 4 chronic kidney disease, or unspecified chronic kidney disease: Secondary | ICD-10-CM | POA: Diagnosis not present

## 2019-06-26 DIAGNOSIS — N2581 Secondary hyperparathyroidism of renal origin: Secondary | ICD-10-CM | POA: Diagnosis not present

## 2019-06-26 DIAGNOSIS — N184 Chronic kidney disease, stage 4 (severe): Secondary | ICD-10-CM | POA: Diagnosis not present

## 2019-06-26 DIAGNOSIS — E1122 Type 2 diabetes mellitus with diabetic chronic kidney disease: Secondary | ICD-10-CM | POA: Diagnosis not present

## 2019-07-01 ENCOUNTER — Ambulatory Visit: Payer: HMO | Admitting: Pulmonary Disease

## 2019-07-02 DIAGNOSIS — Z6834 Body mass index (BMI) 34.0-34.9, adult: Secondary | ICD-10-CM | POA: Insufficient documentation

## 2019-07-02 DIAGNOSIS — K219 Gastro-esophageal reflux disease without esophagitis: Secondary | ICD-10-CM | POA: Insufficient documentation

## 2019-07-02 DIAGNOSIS — M8580 Other specified disorders of bone density and structure, unspecified site: Secondary | ICD-10-CM | POA: Insufficient documentation

## 2019-07-02 DIAGNOSIS — M545 Low back pain, unspecified: Secondary | ICD-10-CM | POA: Insufficient documentation

## 2019-07-02 DIAGNOSIS — M1990 Unspecified osteoarthritis, unspecified site: Secondary | ICD-10-CM | POA: Insufficient documentation

## 2019-07-02 DIAGNOSIS — N2581 Secondary hyperparathyroidism of renal origin: Secondary | ICD-10-CM | POA: Insufficient documentation

## 2019-07-02 DIAGNOSIS — N186 End stage renal disease: Secondary | ICD-10-CM | POA: Insufficient documentation

## 2019-07-04 ENCOUNTER — Ambulatory Visit: Payer: HMO | Admitting: Orthotics

## 2019-07-04 ENCOUNTER — Telehealth: Payer: Self-pay | Admitting: Sports Medicine

## 2019-07-04 ENCOUNTER — Other Ambulatory Visit: Payer: Self-pay

## 2019-07-04 DIAGNOSIS — E0842 Diabetes mellitus due to underlying condition with diabetic polyneuropathy: Secondary | ICD-10-CM

## 2019-07-04 DIAGNOSIS — I890 Lymphedema, not elsewhere classified: Secondary | ICD-10-CM

## 2019-07-04 DIAGNOSIS — M25572 Pain in left ankle and joints of left foot: Secondary | ICD-10-CM

## 2019-07-04 DIAGNOSIS — M2140 Flat foot [pes planus] (acquired), unspecified foot: Secondary | ICD-10-CM

## 2019-07-04 NOTE — Telephone Encounter (Signed)
Pt called asking if she could come in today to see Liliane Channel to get some new diabetic shoes.   I did schedule her and told her I would discuss with you because I did not see in your notes where you recommended shoes. Is it ok to proceed with diabetic shoes?

## 2019-07-04 NOTE — Telephone Encounter (Signed)
Yes and I can go back and make documentation in notes about shoes -Dr. Cannon Kettle

## 2019-07-04 NOTE — Progress Notes (Signed)

## 2019-07-06 DIAGNOSIS — Z23 Encounter for immunization: Secondary | ICD-10-CM | POA: Insufficient documentation

## 2019-07-06 DIAGNOSIS — E1129 Type 2 diabetes mellitus with other diabetic kidney complication: Secondary | ICD-10-CM | POA: Diagnosis not present

## 2019-07-06 DIAGNOSIS — N186 End stage renal disease: Secondary | ICD-10-CM | POA: Diagnosis not present

## 2019-07-06 DIAGNOSIS — N2581 Secondary hyperparathyroidism of renal origin: Secondary | ICD-10-CM | POA: Diagnosis not present

## 2019-07-06 DIAGNOSIS — R52 Pain, unspecified: Secondary | ICD-10-CM | POA: Diagnosis not present

## 2019-07-06 DIAGNOSIS — Z992 Dependence on renal dialysis: Secondary | ICD-10-CM | POA: Diagnosis not present

## 2019-07-08 DIAGNOSIS — J9602 Acute respiratory failure with hypercapnia: Secondary | ICD-10-CM | POA: Diagnosis not present

## 2019-07-08 DIAGNOSIS — I5033 Acute on chronic diastolic (congestive) heart failure: Secondary | ICD-10-CM | POA: Diagnosis not present

## 2019-07-09 ENCOUNTER — Other Ambulatory Visit: Payer: Self-pay

## 2019-07-09 NOTE — Patient Outreach (Signed)
  [facility] Care Management Chronic Special Needs Program  07/09/2019  Name: Jean Mcclain DOB: 29-Nov-1945  MRN: [id number]  Jean Mcclain is enrolled in a chronic special needs plan for Diabetes. Reviewed and updated care plan.  Client is now engaged with [facility] for complex management  Goals Addressed            This Visit's Progress   . COMPLETED: [facility] Care Planning complete by next 9 months   Not on track    Declines assistance    . Client understands the importance of follow-up with providers by attending scheduled visits   On track   . Client will have ADL/IADL needs addressed by next 6 months(continue 07/09/19)   On track    Continue to work with [employer] Education officer, museum    . Client will use Assistive Devices as needed and verbalize understanding of device use   On track   . Client will verbalize knowledge of self management of Hypertension as evidences by BP reading of 140/90 or less; or as defined by provider   On track   . HEMOGLOBIN A1C < 7.0       Diabetes self management actions:  Glucose monitoring per provider recommendations  Eat Healthy  Check feet daily  Visit provider every 3-6 months as directed  Hbg A1C level every 3-6 months.  Eye Exam yearly    . Maintain timely refills of diabetic medication as prescribed within the year .   On track   . COMPLETED: Obtain annual  Lipid Profile, LDL-C       Completed 02/04/19    . Obtain Annual Eye (retinal)  Exam    On track   . COMPLETED: Obtain Annual Foot Exam       Completed 12/11/18    . Obtain annual screen for micro albuminuria (urine) , nephropathy (kidney problems)   On track   . Obtain Hemoglobin A1C at least 2 times per year   On track   . Visit Primary Care Provider or Endocrinologist at least 2 times per year    On track    Client is meeting diabetes self management goal of hemoglobin A1C of <7% with last reading 6.7% Client is now actively engaged with [facility]  for complex management.  Client has seen endocrinologist on 03/21/19.  Reviewed [facility]'s plan of care for client.[facility] providing assistance with client to get assistance with ADL/IADLs, material support and to apply to have ramp installed at her home.  Last seen by [facility] provider on 06/24/19  [facility] completed COVID 19 education. [facility] is providing education on DM self management and encourages communication with them Plan:  Send  outreach letter with a copy of their individualized care plan and Send individual care plan to provider   Chronic care management coordinator will review [facility]'s plan of care and will collaborate with [facility] team as indicated in 3 Months  [place], [facility], [place] Management Coordinator [place] Management [phone]

## 2019-07-11 DIAGNOSIS — D509 Iron deficiency anemia, unspecified: Secondary | ICD-10-CM | POA: Diagnosis not present

## 2019-07-11 DIAGNOSIS — Z992 Dependence on renal dialysis: Secondary | ICD-10-CM | POA: Diagnosis not present

## 2019-07-11 DIAGNOSIS — D689 Coagulation defect, unspecified: Secondary | ICD-10-CM | POA: Diagnosis not present

## 2019-07-11 DIAGNOSIS — E1129 Type 2 diabetes mellitus with other diabetic kidney complication: Secondary | ICD-10-CM | POA: Diagnosis not present

## 2019-07-11 DIAGNOSIS — D631 Anemia in chronic kidney disease: Secondary | ICD-10-CM | POA: Diagnosis not present

## 2019-07-11 DIAGNOSIS — N186 End stage renal disease: Secondary | ICD-10-CM | POA: Diagnosis not present

## 2019-07-11 DIAGNOSIS — R52 Pain, unspecified: Secondary | ICD-10-CM | POA: Diagnosis not present

## 2019-07-11 DIAGNOSIS — N2581 Secondary hyperparathyroidism of renal origin: Secondary | ICD-10-CM | POA: Diagnosis not present

## 2019-07-11 DIAGNOSIS — Z23 Encounter for immunization: Secondary | ICD-10-CM | POA: Diagnosis not present

## 2019-07-13 DIAGNOSIS — D689 Coagulation defect, unspecified: Secondary | ICD-10-CM | POA: Insufficient documentation

## 2019-07-15 DIAGNOSIS — D631 Anemia in chronic kidney disease: Secondary | ICD-10-CM | POA: Insufficient documentation

## 2019-07-15 DIAGNOSIS — N189 Chronic kidney disease, unspecified: Secondary | ICD-10-CM | POA: Insufficient documentation

## 2019-07-22 DIAGNOSIS — G4733 Obstructive sleep apnea (adult) (pediatric): Secondary | ICD-10-CM | POA: Diagnosis not present

## 2019-07-22 DIAGNOSIS — I739 Peripheral vascular disease, unspecified: Secondary | ICD-10-CM | POA: Diagnosis not present

## 2019-07-22 DIAGNOSIS — R2681 Unsteadiness on feet: Secondary | ICD-10-CM | POA: Diagnosis not present

## 2019-07-22 DIAGNOSIS — F329 Major depressive disorder, single episode, unspecified: Secondary | ICD-10-CM | POA: Diagnosis not present

## 2019-07-22 DIAGNOSIS — E662 Morbid (severe) obesity with alveolar hypoventilation: Secondary | ICD-10-CM | POA: Diagnosis not present

## 2019-07-22 DIAGNOSIS — I129 Hypertensive chronic kidney disease with stage 1 through stage 4 chronic kidney disease, or unspecified chronic kidney disease: Secondary | ICD-10-CM | POA: Diagnosis not present

## 2019-07-22 DIAGNOSIS — E1140 Type 2 diabetes mellitus with diabetic neuropathy, unspecified: Secondary | ICD-10-CM | POA: Diagnosis not present

## 2019-07-22 DIAGNOSIS — M545 Low back pain: Secondary | ICD-10-CM | POA: Diagnosis not present

## 2019-07-22 DIAGNOSIS — E1129 Type 2 diabetes mellitus with other diabetic kidney complication: Secondary | ICD-10-CM | POA: Diagnosis not present

## 2019-07-22 DIAGNOSIS — G2581 Restless legs syndrome: Secondary | ICD-10-CM | POA: Diagnosis not present

## 2019-07-29 DIAGNOSIS — T7840XA Allergy, unspecified, initial encounter: Secondary | ICD-10-CM | POA: Insufficient documentation

## 2019-08-07 DIAGNOSIS — I5033 Acute on chronic diastolic (congestive) heart failure: Secondary | ICD-10-CM | POA: Diagnosis not present

## 2019-08-07 DIAGNOSIS — J9602 Acute respiratory failure with hypercapnia: Secondary | ICD-10-CM | POA: Diagnosis not present

## 2019-08-10 DIAGNOSIS — E1022 Type 1 diabetes mellitus with diabetic chronic kidney disease: Secondary | ICD-10-CM | POA: Diagnosis not present

## 2019-08-10 DIAGNOSIS — Z992 Dependence on renal dialysis: Secondary | ICD-10-CM | POA: Diagnosis not present

## 2019-08-10 DIAGNOSIS — N186 End stage renal disease: Secondary | ICD-10-CM | POA: Diagnosis not present

## 2019-08-13 DIAGNOSIS — E1129 Type 2 diabetes mellitus with other diabetic kidney complication: Secondary | ICD-10-CM | POA: Diagnosis not present

## 2019-08-13 DIAGNOSIS — D631 Anemia in chronic kidney disease: Secondary | ICD-10-CM | POA: Diagnosis not present

## 2019-08-13 DIAGNOSIS — Z992 Dependence on renal dialysis: Secondary | ICD-10-CM | POA: Diagnosis not present

## 2019-08-13 DIAGNOSIS — D689 Coagulation defect, unspecified: Secondary | ICD-10-CM | POA: Diagnosis not present

## 2019-08-13 DIAGNOSIS — N2581 Secondary hyperparathyroidism of renal origin: Secondary | ICD-10-CM | POA: Diagnosis not present

## 2019-08-13 DIAGNOSIS — Z23 Encounter for immunization: Secondary | ICD-10-CM | POA: Diagnosis not present

## 2019-08-13 DIAGNOSIS — N186 End stage renal disease: Secondary | ICD-10-CM | POA: Diagnosis not present

## 2019-08-28 DIAGNOSIS — S31109A Unspecified open wound of abdominal wall, unspecified quadrant without penetration into peritoneal cavity, initial encounter: Secondary | ICD-10-CM | POA: Diagnosis not present

## 2019-08-28 DIAGNOSIS — M1631 Unilateral osteoarthritis resulting from hip dysplasia, right hip: Secondary | ICD-10-CM | POA: Diagnosis not present

## 2019-08-28 DIAGNOSIS — Z9181 History of falling: Secondary | ICD-10-CM | POA: Diagnosis not present

## 2019-08-28 DIAGNOSIS — M1990 Unspecified osteoarthritis, unspecified site: Secondary | ICD-10-CM | POA: Diagnosis not present

## 2019-09-07 DIAGNOSIS — J9602 Acute respiratory failure with hypercapnia: Secondary | ICD-10-CM | POA: Diagnosis not present

## 2019-09-07 DIAGNOSIS — I5033 Acute on chronic diastolic (congestive) heart failure: Secondary | ICD-10-CM | POA: Diagnosis not present

## 2019-09-09 DIAGNOSIS — E1022 Type 1 diabetes mellitus with diabetic chronic kidney disease: Secondary | ICD-10-CM | POA: Diagnosis not present

## 2019-09-09 DIAGNOSIS — N186 End stage renal disease: Secondary | ICD-10-CM | POA: Diagnosis not present

## 2019-09-09 DIAGNOSIS — Z992 Dependence on renal dialysis: Secondary | ICD-10-CM | POA: Diagnosis not present

## 2019-09-10 DIAGNOSIS — Z992 Dependence on renal dialysis: Secondary | ICD-10-CM | POA: Diagnosis not present

## 2019-09-10 DIAGNOSIS — D631 Anemia in chronic kidney disease: Secondary | ICD-10-CM | POA: Diagnosis not present

## 2019-09-10 DIAGNOSIS — D689 Coagulation defect, unspecified: Secondary | ICD-10-CM | POA: Diagnosis not present

## 2019-09-10 DIAGNOSIS — N186 End stage renal disease: Secondary | ICD-10-CM | POA: Diagnosis not present

## 2019-09-10 DIAGNOSIS — N2581 Secondary hyperparathyroidism of renal origin: Secondary | ICD-10-CM | POA: Diagnosis not present

## 2019-09-10 DIAGNOSIS — E1129 Type 2 diabetes mellitus with other diabetic kidney complication: Secondary | ICD-10-CM | POA: Diagnosis not present

## 2019-09-10 DIAGNOSIS — Z23 Encounter for immunization: Secondary | ICD-10-CM | POA: Diagnosis not present

## 2019-09-16 DIAGNOSIS — E1165 Type 2 diabetes mellitus with hyperglycemia: Secondary | ICD-10-CM | POA: Diagnosis not present

## 2019-09-16 DIAGNOSIS — I10 Essential (primary) hypertension: Secondary | ICD-10-CM | POA: Diagnosis not present

## 2019-09-16 DIAGNOSIS — N186 End stage renal disease: Secondary | ICD-10-CM | POA: Diagnosis not present

## 2019-09-16 DIAGNOSIS — E7800 Pure hypercholesterolemia, unspecified: Secondary | ICD-10-CM | POA: Diagnosis not present

## 2019-09-24 ENCOUNTER — Ambulatory Visit: Payer: HMO | Admitting: Orthotics

## 2019-09-24 ENCOUNTER — Ambulatory Visit: Payer: HMO | Admitting: Sports Medicine

## 2019-09-24 ENCOUNTER — Telehealth: Payer: Self-pay | Admitting: *Deleted

## 2019-09-24 ENCOUNTER — Other Ambulatory Visit: Payer: Self-pay

## 2019-09-24 ENCOUNTER — Encounter: Payer: Self-pay | Admitting: Sports Medicine

## 2019-09-24 DIAGNOSIS — E0842 Diabetes mellitus due to underlying condition with diabetic polyneuropathy: Secondary | ICD-10-CM

## 2019-09-24 DIAGNOSIS — M25572 Pain in left ankle and joints of left foot: Secondary | ICD-10-CM

## 2019-09-24 DIAGNOSIS — M2140 Flat foot [pes planus] (acquired), unspecified foot: Secondary | ICD-10-CM

## 2019-09-24 DIAGNOSIS — I890 Lymphedema, not elsewhere classified: Secondary | ICD-10-CM

## 2019-09-24 DIAGNOSIS — M79676 Pain in unspecified toe(s): Secondary | ICD-10-CM

## 2019-09-24 DIAGNOSIS — B351 Tinea unguium: Secondary | ICD-10-CM

## 2019-09-24 NOTE — Progress Notes (Signed)
Shoe picked out will not work with the edema she has in left leg; need to order [place] edema shoe size 9 6E  LEFT, size 9 4E rigt.

## 2019-09-24 NOTE — Telephone Encounter (Signed)
Left message for pt to call to confirm she is established with [facility].

## 2019-09-24 NOTE — Telephone Encounter (Signed)
She used to have them prior so that's why she is requesting them again -Dr. Cannon Kettle

## 2019-09-24 NOTE — Telephone Encounter (Signed)
Faxed required form, clinicals and demographics to [facility]. 

## 2019-09-24 NOTE — Telephone Encounter (Signed)
If she can't get home health then see if she can go to wound care center for them to apply unna boots

## 2019-09-24 NOTE — Telephone Encounter (Signed)
-----   Message from Landis Martins, [place] sent at 09/24/2019 10:25 AM EST ----- Regarding: [facility] Lymphedema management unna boot wraps

## 2019-09-24 NOTE — Progress Notes (Signed)
Patient ID: Jean Mcclain, female   DOB: 10-Mar-1946, 73 y.o.   MRN: [id number]   Subjective: Jean Mcclain is a 73 y.o. female patient with history of diabetes who returns to office today complaining of long, painful nails; unable to trim. Patient states that the glucose reading this morning was not recorded and reports that she has terrible swelling and that no one has changed her Unna boot in weeks.  Patient denies any other pedal complaints at this time.  Patient Active Problem List   Diagnosis Date Noted  . OSA (obstructive sleep apnea) 03/13/2019  . Chronic acquired lymphedema 11/05/2018  . Acute respiratory distress   . Acute on chronic diastolic CHF (congestive heart failure) ([place])   . AKI (acute kidney injury) ([place]) 03/30/2017  . Sepsis ([place]) 03/30/2017  . Acute deep vein thrombosis (DVT) of popliteal vein of left lower extremity ([place]) 03/11/2017  . Renal insufficiency 03/11/2017  . Insulin-requiring or dependent type II diabetes mellitus ([place]) 03/11/2017  . Essential hypertension 03/11/2017  . Impingement syndrome of left ankle 01/31/2017  . Posterior tibial tendinitis, left leg 01/31/2017  . Incarcerated ventral hernia 01/02/2013   Current Outpatient Medications on File Prior to Visit  Medication Sig Dispense Refill  . acetaminophen (TYLENOL) 500 MG tablet Take 1,000 mg by mouth 3 (three) times daily as needed for moderate pain or headache.    Marland Kitchen apixaban (ELIQUIS) 2.5 MG TABS tablet Take 2.5 mg by mouth 2 (two) times daily.    . calcitRIOL (ROCALTROL) 0.25 MCG capsule Take 0.25 mcg by mouth daily.    . cephALEXin (KEFLEX) 500 MG capsule     . colchicine 0.6 MG tablet Take 0.6 mg by mouth daily as needed (gout flare).     . cyclobenzaprine (FLEXERIL) 10 MG tablet Take 10 mg by mouth at bedtime.     . ergocalciferol (VITAMIN D2) 50000 UNITS capsule Take 50,000 Units by mouth every Friday.     . febuxostat (ULORIC) 40 MG tablet Take 40 mg by mouth daily.    . furosemide  (LASIX) 80 MG tablet Take 80 mg by mouth See admin instructions. Take 80 mg by mouth once daily, may take a second 80 mg dose as needed for swelling    . glucose blood test strip OneTouch Verio test strips    . hydrOXYzine (ATARAX/VISTARIL) 50 MG tablet     . insulin NPH Human (HUMULIN N,NOVOLIN N) 100 UNIT/ML injection Inject 55 Units into the skin 2 (two) times daily.     . insulin regular (NOVOLIN R) 100 units/mL injection Novolin R Regular U-100 Insulin 100 unit/mL injection solution    . insulin regular (NOVOLIN R,HUMULIN R) 100 units/mL injection Inject 10-30 Units into the skin 3 (three) times daily before meals.     . Lancets (ONETOUCH DELICA PLUS [id number]) MISC     . lidocaine (LIDODERM) 5 % Place 1 patch onto the skin daily. Remove & Discard patch within 12 hours or as directed by MD 30 patch 0  . lovastatin (MEVACOR) 40 MG tablet Take 40 mg by mouth at bedtime.     . metoprolol succinate (TOPROL-XL) 25 MG 24 hr tablet Take 25 mg by mouth daily.     . nitrofurantoin, macrocrystal-monohydrate, (MACROBID) 100 MG capsule     . NYAMYC powder     . omeprazole (PRILOSEC) 20 MG capsule Take 20 mg by mouth daily.    Marland Kitchen rOPINIRole (REQUIP) 4 MG tablet Take 4 mg by mouth 2 (two) times  daily.     . triamcinolone cream (KENALOG) 0.1 %     . cyclobenzaprine (FLEXERIL) 10 MG tablet Take 1 tablet by mouth once a day as directed take 30 min before each hd tx     Current Facility-Administered Medications on File Prior to Visit  Medication Dose Route Frequency Provider Last Rate Last Admin  . betamethasone acetate-betamethasone sodium phosphate (CELESTONE) injection 3 mg  3 mg Intramuscular Once Edrick Kins, DPM       No Known Allergies  No results found for this or any previous visit (from the past 2160 hour(s)).  Objective: General: Patient is awake, alert, and oriented x 3  Integument: Nails are tender, long, thickened and dystrophic with subungual debris, consistent with onychomycosis, 1-5  bilateral. Compression wrap removed on left that revealed preulcerative lesions with significant 2+ pitting edema.  Remaining integument unremarkable.  Vasculature:  Dorsalis Pedis pulse 0/4 bilateral. Posterior Tibial pulse  0/4 bilateral unna boots/wraps bilateral.  Neurology:Protective sensation diminished bilateral.   Musculoskeletal: Unchanged foot deformity. No tenderness with calf compression bilateral.  No acute signs of DVT.  Assessment and Plan: Problem List Items Addressed This Visit    None    Visit Diagnoses    Diabetes mellitus due to underlying condition with diabetic polyneuropathy, unspecified whether long term insulin use ([place])    -  Primary   Relevant Medications   cyclobenzaprine (FLEXERIL) 10 MG tablet   insulin regular (NOVOLIN R) 100 units/mL injection   Lymphedema       Pes planus, unspecified laterality       Arthralgia of left ankle         -Examined patient. -Discussed and educated patient on diabetic foot care, especially with  regards to the vascular, neurological and musculoskeletal systems.  -Mechanically debrided all nails 1-5 bilateral using sterile nail nipper and filed with dremel without incident  -Re-applied unna boot to left and [facility] -Patient to meet with Liliane Channel today about shoes -Patient to return in 3 months for at risk foot care -Patient advised to call the office if any problems or questions arise in the meantime.  Landis Martins, DPM

## 2019-09-25 ENCOUNTER — Telehealth: Payer: Self-pay | Admitting: Sports Medicine

## 2019-09-25 NOTE — Telephone Encounter (Signed)
Noted in 09/24/2019 Telephone Call.

## 2019-09-25 NOTE — Telephone Encounter (Signed)
[facility] states they only perform unna boots if pt has cellulitis while in their care.

## 2019-09-25 NOTE — Telephone Encounter (Signed)
[facility] declined referral due to lack of staff.

## 2019-09-25 NOTE — Telephone Encounter (Signed)
Faxed Referral to [facility] PT - Lymphedema, clinicals and demographics.

## 2019-09-25 NOTE — Telephone Encounter (Signed)
[facility] called lori dodson called to say that they could not take on pt due to staffing and zip code

## 2019-09-25 NOTE — Telephone Encounter (Signed)
Ok thanks 

## 2019-09-27 ENCOUNTER — Telehealth: Payer: Self-pay | Admitting: Sports Medicine

## 2019-09-27 NOTE — Telephone Encounter (Signed)
Pt called and stated [facility] uses [facility]. [place] to [place], referral was sent to [facility] PT.

## 2019-10-07 ENCOUNTER — Other Ambulatory Visit: Payer: Self-pay

## 2019-10-07 DIAGNOSIS — I5033 Acute on chronic diastolic (congestive) heart failure: Secondary | ICD-10-CM | POA: Diagnosis not present

## 2019-10-07 DIAGNOSIS — J9602 Acute respiratory failure with hypercapnia: Secondary | ICD-10-CM | POA: Diagnosis not present

## 2019-10-07 NOTE — Patient Outreach (Signed)
  [place] [facility]) Care Management Chronic Special Needs Program  10/07/2019  Name: Jean Mcclain DOB: 22-Mar-1946  MRN: [id number]  Ms. Jean Mcclain is enrolled in a chronic special needs plan for Diabetes.  Reviewed client's plan of care in [facility]'s electronic medical record and available data to update clients Interdisciplinary care plan.  Goals Addressed            This Visit's Progress   . COMPLETED: Client understands the importance of follow-up with providers by attending scheduled visits   On track    Keeping scheduled appointments     . COMPLETED: Client will have ADL/IADL needs addressed by next 6 months(continue 07/09/19)   On track     Worked with [facility] Education officer, museum. Has aide twice a week    . Client will use Assistive Devices as needed and verbalize understanding of device use   On track   . Client will verbalize knowledge of self management of Hypertension as evidences by BP reading of 140/90 or less; or as defined by provider   On track    Less than 140/90 on visits     . HEMOGLOBIN A1C < 7.0       Last result 09/16/19 5.9% Diabetes self management actions:  Glucose monitoring per provider recommendations  Eat Healthy  Check feet daily  Visit provider every 3-6 months as directed  Hbg A1C level every 3-6 months.  Eye Exam yearly    . COMPLETED: Obtain Annual Eye (retinal)  Exam    On track    Completed by [facility]    . COMPLETED: Obtain annual screen for micro albuminuria (urine) , nephropathy (kidney problems)       On dialysis    . COMPLETED: Obtain Hemoglobin A1C at least 2 times per year       Completed 03/21/19, 09/16/19    . COMPLETED: Visit Primary Care Provider or Endocrinologist at least 2 times per year        Primary care provider 02/21/19,07/22/19 Endocrinologist 03/21/19     Client is meeting diabetes self management goal of hemoglobin A1C of <7% with last reading 5.9%  Client is actively engaged with [facility]  for complex management.  Client has seen endocrinologist on 03/21/19.  Reviewed [facility]'s plan of care for client. [facility] providing assistance with client to get assistance with ADL/IADLs, material support and  to have ramp installed at her home. Client now has aide twice a week to assist with shopping and ADL's/IADL's.  Client has [facility] transportation. Client is going to dialysis three times a week.  Last seen by [facility] provider on 07/19/19 and next visit on 10/15/19.  [facility] is providing education on DM self management and encourages communication with them Plan:  Send  outreach letter with a copy of their individualized care plan and Send individual care plan to provider              Chronic care management coordinator will review [facility]'s plan of care and will collaborate with [facility] team as indicated in 3 Months    [place], [facility], [place] Management Coordinator [place] Management [phone]

## 2019-10-10 ENCOUNTER — Emergency Department (HOSPITAL_COMMUNITY)
Admission: EM | Admit: 2019-10-10 | Discharge: 2019-10-10 | Disposition: A | Discharge status: Home / Self Care | Payer: HMO | Attending: Emergency Medicine | Admitting: Emergency Medicine

## 2019-10-10 ENCOUNTER — Encounter (HOSPITAL_COMMUNITY): Payer: Self-pay | Admitting: Emergency Medicine

## 2019-10-10 ENCOUNTER — Emergency Department (HOSPITAL_BASED_OUTPATIENT_CLINIC_OR_DEPARTMENT_OTHER)
Admit: 2019-10-10 | Discharge: 2019-10-10 | Disposition: A | Discharge status: Home / Self Care | Payer: HMO | Attending: Emergency Medicine | Admitting: Emergency Medicine

## 2019-10-10 ENCOUNTER — Other Ambulatory Visit: Payer: Self-pay

## 2019-10-10 DIAGNOSIS — E1122 Type 2 diabetes mellitus with diabetic chronic kidney disease: Secondary | ICD-10-CM | POA: Diagnosis not present

## 2019-10-10 DIAGNOSIS — Z794 Long term (current) use of insulin: Secondary | ICD-10-CM | POA: Diagnosis not present

## 2019-10-10 DIAGNOSIS — I5032 Chronic diastolic (congestive) heart failure: Secondary | ICD-10-CM | POA: Diagnosis not present

## 2019-10-10 DIAGNOSIS — N189 Chronic kidney disease, unspecified: Secondary | ICD-10-CM | POA: Diagnosis not present

## 2019-10-10 DIAGNOSIS — Z87891 Personal history of nicotine dependence: Secondary | ICD-10-CM | POA: Insufficient documentation

## 2019-10-10 DIAGNOSIS — R2242 Localized swelling, mass and lump, left lower limb: Secondary | ICD-10-CM | POA: Diagnosis not present

## 2019-10-10 DIAGNOSIS — Z992 Dependence on renal dialysis: Secondary | ICD-10-CM | POA: Diagnosis not present

## 2019-10-10 DIAGNOSIS — Z79899 Other long term (current) drug therapy: Secondary | ICD-10-CM | POA: Diagnosis not present

## 2019-10-10 DIAGNOSIS — E1022 Type 1 diabetes mellitus with diabetic chronic kidney disease: Secondary | ICD-10-CM | POA: Diagnosis not present

## 2019-10-10 DIAGNOSIS — Z7901 Long term (current) use of anticoagulants: Secondary | ICD-10-CM | POA: Insufficient documentation

## 2019-10-10 DIAGNOSIS — R609 Edema, unspecified: Secondary | ICD-10-CM

## 2019-10-10 DIAGNOSIS — M7989 Other specified soft tissue disorders: Secondary | ICD-10-CM | POA: Diagnosis not present

## 2019-10-10 DIAGNOSIS — N186 End stage renal disease: Secondary | ICD-10-CM | POA: Diagnosis not present

## 2019-10-10 DIAGNOSIS — I130 Hypertensive heart and chronic kidney disease with heart failure and stage 1 through stage 4 chronic kidney disease, or unspecified chronic kidney disease: Secondary | ICD-10-CM | POA: Diagnosis not present

## 2019-10-10 NOTE — Progress Notes (Signed)
Left lower extremity venous duplex has been completed. Preliminary results can be found in CV Proc through chart review.  Results were given to [facility] PA.  10/10/19 4:01 PM Carlos Levering RVT

## 2019-10-10 NOTE — ED Triage Notes (Signed)
Pt reports that she has had left leg swelling for year. Reports when she was getting wrapped last night by a nurse, she was concerned for blood clot. Hx blood clots and taking Eliquis. Denies any new falls or injuries. Reports also has gout.

## 2019-10-10 NOTE — ED Notes (Signed)
ED PA at bedside

## 2019-10-10 NOTE — ED Provider Notes (Signed)
[facility] DEPT Provider Note   CSN: [id number] Arrival date & time: 10/10/19  1254     History Chief Complaint  Patient presents with  . Leg Swelling    Jean Mcclain is a 73 y.o. female presenting for evaluation of left leg swelling.  Patient states her left leg has been more swollen than her right for about a year.  This has not been worsening recently.  Patient states that she often is in a seated position, causing her legs to be worse.  She gets her legs wrapped with home health.  The home health nurse yesterday was concerned about possible DVT, recommend the patient be evaluated.  Patient denies increased pain.  She denies increased redness, swelling, or numbness.  She continues to take her Eliquis daily as prescribed.  No change in dose.  She denies chest pain or shortness of breath.  Patient has a history of ESRD on dialysis, goes Tuesday, Thursday, Saturday.  She did not go to today's session due to coming to the ER.  HPI     Past Medical History:  Diagnosis Date  . Anxiety   . Arthritis    osteo  . Chronic kidney disease   . Diabetes mellitus, type 2 (HCC)    Type II  . DVT (deep venous thrombosis) ([place]) 03/2017   left popliteal  . Facet joint disease   . Facet joint disease    foot  . Fibula fracture 02/2017   left  . GERD (gastroesophageal reflux disease)   . Hyperlipidemia   . Hypertension   . OSA (obstructive sleep apnea)    does not use CPAP   . Osteopenia   . Shortness of breath    with exertion  . Venous stasis ulcers ([place])   . Vitamin D deficiency     Patient Active Problem List   Diagnosis Date Noted  . OSA (obstructive sleep apnea) 03/13/2019  . Chronic acquired lymphedema 11/05/2018  . Acute respiratory distress   . Acute on chronic diastolic CHF (congestive heart failure) ([place])   . AKI (acute kidney injury) ([place]) 03/30/2017  . Sepsis ([place]) 03/30/2017  . Acute deep vein thrombosis (DVT) of popliteal vein  of left lower extremity ([place]) 03/11/2017  . Renal insufficiency 03/11/2017  . Insulin-requiring or dependent type II diabetes mellitus ([place]) 03/11/2017  . Essential hypertension 03/11/2017  . Impingement syndrome of left ankle 01/31/2017  . Posterior tibial tendinitis, left leg 01/31/2017  . Incarcerated ventral hernia 01/02/2013    Past Surgical History:  Procedure Laterality Date  . AV FISTULA PLACEMENT Right 08/20/2018   Procedure: ARTERIOVENOUS (AV) FISTULA CREATION BRACHIOCEPHALIC;  Surgeon: Angelia Mould, MD;  Location: [facility];  Service: Vascular;  Laterality: Right;  . BREAST SURGERY Right 03/1997   biospy  . CARPAL TUNNEL RELEASE Left 2016  . INSERTION OF MESH N/A 01/08/2013   Procedure: INSERTION OF MESH;  Surgeon: Earnstine Regal, MD;  [facility];  Service: General;  Laterality: N/A;  . IR THORACENTESIS ASP PLEURAL SPACE W/IMG GUIDE  04/06/2017  . JOINT REPLACEMENT Right 1999  . TOTAL KNEE ARTHROPLASTY Left 1999   Left  . TRIGGER FINGER RELEASE Left 11/2014  . TRIGGER FINGER RELEASE Right   . VENTRAL HERNIA REPAIR N/A 01/08/2013   Procedure: LAPAROSCOPIC VENTRAL HERNIA;  Surgeon: Earnstine Regal, MD;  [facility];  Service: General;  Laterality: N/A;     OB History    Gravida  0   Para  0   Term  0   Preterm  0   AB  0   Living  0     SAB  0   TAB  0   Ectopic  0   Multiple  0   Live Births  0           Family History  Problem Relation Age of Onset  . Leukemia Mother   . Hypertension Brother   . Emphysema Father     Social History   Tobacco Use  . Smoking status: Former Smoker    Years: 27.00    Types: Cigarettes    Quit date: 10/10/1981    Years since quitting: 38.0  . Smokeless tobacco: Never Used  Substance Use Topics  . Alcohol use: No  . Drug use: No    Home Medications Prior to Admission medications   Medication Sig Start Date End Date Taking? Authorizing Provider  acetaminophen (TYLENOL) 500 MG tablet Take 1,000  mg by mouth 3 (three) times daily as needed for moderate pain or headache.   Yes [provider]  apixaban (ELIQUIS) 2.5 MG TABS tablet Take 2.5 mg by mouth 2 (two) times daily.   Yes [provider]  colchicine 0.6 MG tablet Take 0.6 mg by mouth daily as needed (gout flare).  02/21/17  Yes [provider]  cyclobenzaprine (FLEXERIL) 10 MG tablet Take 10 mg by mouth at bedtime.    Yes [provider]  febuxostat (ULORIC) 40 MG tablet Take 40 mg by mouth daily.   Yes [provider]  hydrOXYzine (ATARAX/VISTARIL) 50 MG tablet Take 50 mg by mouth every 4 (four) hours as needed for anxiety or itching.  06/20/19  Yes [provider]  insulin NPH Human (HUMULIN N,NOVOLIN N) 100 UNIT/ML injection Inject 55 Units into the skin 2 (two) times daily.    Yes [provider]  insulin regular (NOVOLIN R,HUMULIN R) 100 units/mL injection Inject 10-30 Units into the skin 3 (three) times daily before meals.    Yes [provider]  lidocaine-prilocaine (EMLA) cream Apply 1 application topically See admin instructions. 1 hour before dialysis to port 09/28/19  Yes [provider]  lovastatin (MEVACOR) 40 MG tablet Take 40 mg by mouth at bedtime.    Yes [provider]  metoprolol succinate (TOPROL-XL) 25 MG 24 hr tablet Take 25 mg by mouth daily.  07/18/18  Yes [provider]  multivitamin (RENA-VIT) TABS tablet Take 1 tablet by mouth daily. 10/09/19  Yes [provider]  omeprazole (PRILOSEC) 20 MG capsule Take 20 mg by mouth daily.   Yes [provider]  rOPINIRole (REQUIP) 4 MG tablet Take 4 mg by mouth 2 (two) times daily.    Yes [provider]  triamcinolone cream (KENALOG) 0.1 % Apply 1 application topically 2 (two) times daily as needed (itching).  06/20/19  Yes [provider]  glucose blood test strip OneTouch Verio test strips    [provider]  Lancets (ONETOUCH  DELICA PLUS ATFTDD22G) [place]  03/05/19   [provider]  lidocaine (LIDODERM) 5 % Place 1 patch onto the skin daily. Remove & Discard patch within 12 hours or as directed by MD Patient not taking: Reported on 10/10/2019 12/11/18   Landis Martins, DPM    Allergies    Patient has no known allergies.  Review of Systems   Review of Systems  Musculoskeletal:       Left leg swelling  Hematological:  Bruises/bleeds easily.    Physical Exam Updated Vital Signs BP (!) 120/53   Pulse 77   Temp 98 F (36.7 C) (Oral)   Resp (!) 22   SpO2 100%   Physical Exam Vitals and nursing note reviewed.  Constitutional:      General: She is not in acute distress.    Appearance: She is well-developed.     Comments: Elderly female resting comfortably in the bed in no acute distress  HENT:     Head: Normocephalic and atraumatic.  Eyes:     Conjunctiva/sclera: Conjunctivae normal.     Pupils: Pupils are equal, round, and reactive to light.  Cardiovascular:     Rate and Rhythm: Normal rate and regular rhythm.     Pulses: Normal pulses.  Pulmonary:     Effort: Pulmonary effort is normal. No respiratory distress.     Breath sounds: Normal breath sounds. No wheezing.  Abdominal:     General: There is no distension.     Palpations: Abdomen is soft. There is no mass.     Tenderness: There is no abdominal tenderness. There is no guarding or rebound.  Musculoskeletal:     Cervical back: Normal range of motion and neck supple.     Comments: Left leg is more swollen than the right.  Bilateral chronic venous stasis skin changes.  Pedal pulses intact bilaterally.  No significant tenderness of the left leg.  Skin:    General: Skin is warm and dry.     Capillary Refill: Capillary refill takes less than 2 seconds.  Neurological:     Mental Status: She is alert and oriented to person, place, and time.     ED Results / Procedures / Treatments   Labs (all labs ordered are listed, but only abnormal  results are displayed) Labs Reviewed - No data to display  EKG None  Radiology VAS [place] LOWER EXTREMITY VENOUS (DVT) ([facility] 7a-7p)  Result Date: 10/10/2019  Lower Venous Study Indications: Edema.  Risk Factors: None identified. Limitations: Body habitus and poor ultrasound/tissue interface. Comparison Study: No prior studies. Performing Technologist: Oliver Hum RVT  Examination Guidelines: A complete evaluation includes B-mode imaging, spectral Doppler, color Doppler, and power Doppler as needed of all accessible portions of each vessel. Bilateral testing is considered an integral part of a complete examination. Limited examinations for reoccurring indications may be performed as noted.  +-----+---------------+---------+-----------+----------+--------------+ RIGHTCompressibilityPhasicitySpontaneityPropertiesThrombus Aging +-----+---------------+---------+-----------+----------+--------------+ CFV  Full           Yes      Yes                                 +-----+---------------+---------+-----------+----------+--------------+   +---------+---------------+---------+-----------+----------+--------------+ LEFT     CompressibilityPhasicitySpontaneityPropertiesThrombus Aging +---------+---------------+---------+-----------+----------+--------------+ CFV      Full           Yes      Yes                                 +---------+---------------+---------+-----------+----------+--------------+ SFJ      Full                                                        +---------+---------------+---------+-----------+----------+--------------+  FV Prox  Full                                                        +---------+---------------+---------+-----------+----------+--------------+ FV Mid   Full                                                        +---------+---------------+---------+-----------+----------+--------------+ FV Distal               Yes       Yes                                 +---------+---------------+---------+-----------+----------+--------------+ PFV      Full                                                        +---------+---------------+---------+-----------+----------+--------------+ POP      Full           Yes      Yes                                 +---------+---------------+---------+-----------+----------+--------------+ PTV      Full                                                        +---------+---------------+---------+-----------+----------+--------------+ PERO                                                  Not visualized +---------+---------------+---------+-----------+----------+--------------+     Summary: Right: No evidence of common femoral vein obstruction. Left: There is no evidence of deep vein thrombosis in the lower extremity. However, portions of this examination were limited- see technologist comments above. No cystic structure found in the popliteal fossa.  *See table(s) above for measurements and observations.    Preliminary     Procedures Procedures (including critical care time)  Medications Ordered in ED Medications - No data to display  ED Course  I have reviewed the triage vital signs and the nursing notes.  Pertinent labs & imaging results that were available during my care of the patient were reviewed by me and considered in my medical decision making (see chart for details).    MDM Rules/Calculators/A&P                      Patient presenting for evaluation of left leg swelling.  Physical exam reassuring, she appears neurovascularly intact.  As symptoms have been present for over a year, low suspicion for acute DVT.  However, as patient has a  history of DVT, will obtain ultrasound for further evaluation.  Erythematous skin changes are bilateral and are more likely chronic venous stasis than cellulitis.  Without increased pain, fevers, or signs of infection,  will not treat for cellulitis.  Ultrasound negative for acute DVT.  Case discussed with attending, Dr. Roslynn Amble evaluated the patient.  At this time, patient appears safe for discharge.  Return precautions given.  Patient states she understands and agrees to plan.  Final Clinical Impression(s) / ED Diagnoses Final diagnoses:  Left leg swelling    Rx / DC Orders ED Discharge Orders    None       Franchot Heidelberg, PA-C 10/10/19 1741    Lucrezia Starch, MD 10/11/19 1520

## 2019-10-10 NOTE — ED Notes (Signed)
Vascular Ultrasound at bedside at this time.

## 2019-10-10 NOTE — Discharge Instructions (Signed)
Follow up with your primary care doctor for further evaluation of your leg.  Follow up with your dialysis center to get treatment at the next available time.  Return to the ER with any new, worsening, or concerning symptoms.

## 2019-10-12 DIAGNOSIS — D689 Coagulation defect, unspecified: Secondary | ICD-10-CM | POA: Diagnosis not present

## 2019-10-12 DIAGNOSIS — Z992 Dependence on renal dialysis: Secondary | ICD-10-CM | POA: Diagnosis not present

## 2019-10-12 DIAGNOSIS — R52 Pain, unspecified: Secondary | ICD-10-CM | POA: Diagnosis not present

## 2019-10-12 DIAGNOSIS — E1129 Type 2 diabetes mellitus with other diabetic kidney complication: Secondary | ICD-10-CM | POA: Diagnosis not present

## 2019-10-12 DIAGNOSIS — N186 End stage renal disease: Secondary | ICD-10-CM | POA: Diagnosis not present

## 2019-10-12 DIAGNOSIS — D631 Anemia in chronic kidney disease: Secondary | ICD-10-CM | POA: Diagnosis not present

## 2019-10-12 DIAGNOSIS — N2581 Secondary hyperparathyroidism of renal origin: Secondary | ICD-10-CM | POA: Diagnosis not present

## 2019-10-25 ENCOUNTER — Telehealth (HOSPITAL_COMMUNITY): Payer: Self-pay | Admitting: Physical Therapy

## 2019-10-25 NOTE — Telephone Encounter (Signed)
L/m a message that 1/20 had to be cx due to CR out of the office and r/s for 1/27 @ 2:45pm. Requested return call from pt to confirm. NF 10/25/2019

## 2019-10-28 ENCOUNTER — Telehealth (HOSPITAL_COMMUNITY): Payer: Self-pay | Admitting: Physical Therapy

## 2019-10-28 ENCOUNTER — Telehealth: Payer: Self-pay | Admitting: Neurology

## 2019-10-28 NOTE — Telephone Encounter (Signed)
Called the patient to offer a earlier apt from the waitlist. There was no answer. LVM advising the patient to call back and we can see if the opening is still available.

## 2019-10-28 NOTE — Telephone Encounter (Signed)
s/w Pt understand to be here for eval on 11/06/19. NF 10/28/19

## 2019-10-30 ENCOUNTER — Ambulatory Visit (HOSPITAL_COMMUNITY): Payer: HMO | Admitting: Physical Therapy

## 2019-10-30 ENCOUNTER — Encounter (HOSPITAL_COMMUNITY): Payer: Self-pay

## 2019-11-06 ENCOUNTER — Ambulatory Visit (HOSPITAL_COMMUNITY): Payer: HMO | Attending: Sports Medicine | Admitting: Physical Therapy

## 2019-11-06 ENCOUNTER — Encounter (HOSPITAL_COMMUNITY): Payer: Self-pay | Admitting: Physical Therapy

## 2019-11-06 ENCOUNTER — Other Ambulatory Visit: Payer: Self-pay

## 2019-11-06 DIAGNOSIS — R2681 Unsteadiness on feet: Secondary | ICD-10-CM | POA: Insufficient documentation

## 2019-11-06 DIAGNOSIS — M6281 Muscle weakness (generalized): Secondary | ICD-10-CM | POA: Diagnosis not present

## 2019-11-06 DIAGNOSIS — I890 Lymphedema, not elsewhere classified: Secondary | ICD-10-CM | POA: Diagnosis not present

## 2019-11-06 NOTE — Therapy (Signed)
[facility] [place], [place], [address] Phone: [phone]   Fax:  [phone]  Physical Mcclain Evaluation  Patient Details  Name: Jean Mcclain MRN: [id number] Date of Birth: 1946/07/21 Referring Provider (Mcclain): Landis Martins   Encounter Date: 11/06/2019  Mcclain End of Session - 11/06/19 1635    Visit Number  1    Number of Visits  12    Date for Mcclain Re-Evaluation  12/04/19    Authorization Type  [employer]    Authorization - Visit Number  1    Authorization - Number of Visits  10    Mcclain Start Time  7616    Mcclain Stop Time  1620    Mcclain Time Calculation (min)  95 min    Activity Tolerance  Patient tolerated treatment well    Behavior During Mcclain  [facility] for tasks assessed/performed       Past Medical History:  Diagnosis Date  . Anxiety   . Arthritis    osteo  . Chronic kidney disease   . Diabetes mellitus, type 2 (HCC)    Type II  . DVT (deep venous thrombosis) ([place]) 03/2017   left popliteal  . Facet joint disease   . Facet joint disease    foot  . Fibula fracture 02/2017   left  . GERD (gastroesophageal reflux disease)   . Hyperlipidemia   . Hypertension   . OSA (obstructive sleep apnea)    does not use CPAP   . Osteopenia   . Shortness of breath    with exertion  . Venous stasis ulcers ([place])   . Vitamin D deficiency     Past Surgical History:  Procedure Laterality Date  . AV FISTULA PLACEMENT Right 08/20/2018   Procedure: ARTERIOVENOUS (AV) FISTULA CREATION BRACHIOCEPHALIC;  Surgeon: Angelia Mould, MD;  Location: [facility];  Service: Vascular;  Laterality: Right;  . BREAST SURGERY Right 03/1997   biospy  . CARPAL TUNNEL RELEASE Left 2016  . INSERTION OF MESH N/A 01/08/2013   Procedure: INSERTION OF MESH;  Surgeon: Earnstine Regal, MD;  [facility];  Service: General;  Laterality: N/A;  . IR THORACENTESIS ASP PLEURAL SPACE W/IMG GUIDE  04/06/2017  . JOINT REPLACEMENT Right 1999  . TOTAL  KNEE ARTHROPLASTY Left 1999   Left  . TRIGGER FINGER RELEASE Left 11/2014  . TRIGGER FINGER RELEASE Right   . VENTRAL HERNIA REPAIR N/A 01/08/2013   Procedure: LAPAROSCOPIC VENTRAL HERNIA;  Surgeon: Earnstine Regal, MD;  [facility];  Service: General;  Laterality: N/A;    There were no vitals filed for this visit.   Subjective Assessment - 11/06/19 1443    Subjective  Jean Mcclain states that her legs have been swelling for about five years.  Her Lt leg is worse than her RT.  She was scratching her Lt Mcclain and now she has a blister.  Jean Mcclain states that she was having someone come out to Jean house and wrapping her Left leg with a Unna boot but Jean insurance will not cover her treatment anylonger.  She states that she does not walk any longer due to pain in her ankle.  She has not been walking for about a year, prior to this she was walking with a walker.  She has had a wound on her Lt leg and went to Jean wound center a couple of years ago, they gave her some compression stocking but her legs continued  to be swollen.  At this time she does not know what Mcclain is or how it is managed.    Pertinent History  DM, gout, B Mcclain,    Limitations  Standing;Walking    How long can you sit comfortably?  no problem    How long can you stand comfortably?  only a few minutes    How long can you walk comfortably?  only a few steps    Patient Stated Goals  leg swelling to decrease to be more balanced    Currently in Pain?  Other (Comment)         [facility] Mcclain Assessment - 11/06/19 0001      Assessment   Medical Diagnosis  B Mcclain    Referring Provider (Mcclain)  Titorya Stover    Onset Date/Surgical Date  --   chronic    Prior Mcclain  none      Precautions   Precautions  Other (comment)   cellulitis     Restrictions   Weight Bearing Restrictions  No      Balance Screen   Has Jean patient fallen in Jean past 6 months  No    Has Jean patient had a decrease in activity level because of a  fear of falling?   Yes    Is Jean patient reluctant to leave their home because of a fear of falling?   Yes      [place] residence      Prior Function   Level of Independence  Independent      Observation/Other Assessments   Observations  blister lateral Rt Mcclain 3x1.5 cm , very dry flaky skin     Focus on Therapeutic Outcomes (FOTO)   --   life impact 57 out of 16 as 2 are N/A       Mcclain/ONCOLOGY QUESTIONNAIRE - 11/06/19 1536      Right Lower Extremity Mcclain   30 cm Proximal to Floor at Lateral Plantar Foot  44.3 cm    20 cm Proximal to Floor at Lateral Plantar Foot  38.9 1    10  cm Proximal to Floor at Lateral Malleoli  27.5 cm    Circumference of ankle/heel  30.5 cm.    5 cm Proximal to 1st MTP Joint  21.5 cm    Across MTP Joint  22.5 cm      Left Lower Extremity Mcclain   30 cm Proximal to Floor at Lateral Plantar Foot  46.6 cm    20 cm Proximal to Floor at Lateral Plantar Foot  46.2 cm    10 cm Proximal to Floor at Lateral Malleoli  34.8 cm    Circumference of ankle/heel  33.5 cm.    5 cm Proximal to 1st MTP Joint  22.5 cm    Across MTP Joint  22.1 cm    Around Proximal Great Toe  9.2 cm             Objective measurements completed on examination: See above findings.      [place] Adult Mcclain Treatment/Exercise - 11/06/19 0001      Exercises   Exercises  Knee/Hip      Knee/Hip Exercises: Seated   Long Arc Quad  Both;5 reps    Other Seated Knee/Hip Exercises  ankle DF/PF x 10     Other Seated Knee/Hip Exercises  significant time spent on diapharagmic breathing     Marching  Both;5 reps  Abduction/Adduction   Both;5 reps      Manual Mcclain   Manual Mcclain  Manual Lymphatic Drainage (MLD);Compression Bandaging    Manual Mcclain comments  completed seperate from all other aspects of treatment     Manual Lymphatic Drainage (MLD)  modified foot to knee B ant and post only due to time restraints.      Compression Bandaging  using profore at this time to assist with decongestion of B Mcclain              Mcclain Education - 11/06/19 1634    Education Details  Mcclain exercises, diapharagmic breathing. Jean need to cleanse and lotion Mcclain on a daily basis.   Mcclain educated on Mcclain, what it is, how it can be managed.  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    Time  2    Period  Weeks    Status  New    Target Date  11/20/19      Mcclain SHORT TERM GOAL #2   Title  Mcclain to be completing her breathing and Mcclain exercises at least one time a day to increase lymphatic circulation    Time  2    Period  Weeks      Mcclain SHORT TERM GOAL #3   Title  Blister on RT Mcclain to be healed.    Time  2    Period  Weeks    Status  New        Mcclain Long Term Goals - 11/06/19 1701      Mcclain LONG TERM GOAL #1   Title  Mcclain to have lost 4cm of volume from LT Mcclain< ; 2 from RT to allow donning of pants and shoes to be easier.    Time  4    Status  New    Target Date  12/04/19      Mcclain LONG TERM GOAL #2   Title  Mcclain to have and be using a compression pump for maintenance phase of treatment.    Time  4    Period  Weeks    Status  New      Mcclain LONG TERM GOAL #3   Title  Mcclain to have and be able to don Juxtafit B for maintenance phase of treatment.    Time  4    Period  Weeks    Status  New      Mcclain LONG TERM GOAL #4   Title  Due to decreased lymphatic load Mcclain to state that she has been able to walk more and easier in her apartment using her walker.    Time  4    Period  Weeks    Status  New             Plan - 11/06/19 1637    Clinical Impression Statement  Jean Mcclain is a 74 yo female who is currently basically  wheelchair bound except for walking short distances ie into her bathroom at home.  She lives alone and has caregivers come in to assist her with housework/meals.  She has  been dx with Mcclain for years and has had homehealth coming in placing unnaboots on her.  At this time her insurance will no longer pay for this and she was referred to skilled OP Mcclain for treatment for her Mcclain.  She currently has increased edema in B Mcclain with B erythema with Jean Lt greater than Jean right.  There is a blister on Jean lateral aspect of Jean Mcclain.  Jean Mcclain.  She states if she could find a clinic that would treat her in [place] she could take Jean SCAT but they [place].  At this time Jean Mcclain will benefit from skilled Mcclain To continue to improve her knowledge of how to manage her Mcclain, manual decongestive techniques as well as compression bandaging.  Mcclain will benefit from a compression pump as well as juxtafit garments at discharge.  Therapist will fax MD order for these at this time.    Personal Factors and Comorbidities  Age;Finances;Fitness;Transportation;Time since onset of injury/illness/exacerbation;Past/Current Experience;Comorbidity 3+    Comorbidities  DM, HX of DVT, CHF, renal failure on dialysis.    Examination-Activity Limitations  Bathing;Bend;Carry;Dressing;Locomotion Level;Stairs;Stand    Examination-Participation Restrictions  Church;Cleaning;Community Activity;Driving;Laundry;Shop    Stability/Clinical Decision Making  Evolving/Moderate complexity    Clinical Decision Making  Moderate    Rehab Potential  Good    Mcclain Frequency  3x / week    Mcclain Duration  4 weeks    Mcclain Treatment/Interventions  Manual lymph drainage;Patient/family education;Therapeutic exercise;Compression bandaging    Mcclain Next Visit Plan  Begin manual lymph drainage, cut foam for Mcclain, begin bandaging with short stretch as soon as bandages come in;  use profore in Jean meantime    Mcclain Home Exercise Plan  Diapharagmic breathing, Mcclain exercises       Patient will benefit from skilled therapeutic intervention in order to improve Jean following deficits and impairments:  Decreased skin integrity, Decreased range of motion, Decreased strength, Difficulty walking, Increased edema  Visit Diagnosis: Mcclain, not elsewhere classified  Muscle weakness (generalized)  Unsteadiness on feet     Problem List Patient Active Problem List   Diagnosis Date Noted  . OSA (obstructive sleep apnea) 03/13/2019  . 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 Acute respiratory distress   . Acute on chronic diastolic CHF (congestive heart failure) ([place])   . AKI (acute kidney injury) ([place]) 03/30/2017  . Sepsis ([place]) 03/30/2017  . Acute deep vein thrombosis (DVT) of popliteal vein of left lower extremity ([place]) 03/11/2017  . Renal insufficiency 03/11/2017  . Insulin-requiring or dependent type II diabetes mellitus ([place]) 03/11/2017  . Essential hypertension 03/11/2017  . Impingement syndrome of left ankle 01/31/2017  . Posterior tibial tendinitis, left leg 01/31/2017  . Incarcerated ventral hernia 01/02/2013    Rayetta Humphrey, Mcclain CLT [phone] 11/06/2019, 5:04 PM  [facility] [address] [place], [place], [address] Phone: [phone]   Fax:  [phone]  Name: SUMMIT BORCHARDT MRN: [id number] Date of Birth: 08/17/1946

## 2019-11-07 DIAGNOSIS — J9602 Acute respiratory failure with hypercapnia: Secondary | ICD-10-CM | POA: Diagnosis not present

## 2019-11-07 DIAGNOSIS — I5033 Acute on chronic diastolic (congestive) heart failure: Secondary | ICD-10-CM | POA: Diagnosis not present

## 2019-11-07 NOTE — Therapy (Signed)
[place], [place], [address] Phone: [phone]   Fax:  [phone]  Physical Therapy Evaluation  Patient Details  Name: Jean Mcclain MRN: [id number] Date of Birth: 06-01-1946 Referring Provider (Jean): Landis Martins   Encounter Date: 11/06/2019  Jean End of Session - 11/06/19 1635    Visit Number  1    Number of Visits  12    Date for Jean Re-Evaluation  12/04/19    Authorization Type  [employer]    Authorization - Visit Number  1    Authorization - Number of Visits  10    Jean Start Time  0932    Jean Stop Time  1620    Jean Time Calculation (min)  95 min    Activity Tolerance  Patient tolerated treatment well    Behavior During Therapy  Jean Mcclain for tasks assessed/performed       Past Medical History:  Diagnosis Date  . Anxiety   . Arthritis    osteo  . Chronic kidney disease   . Diabetes mellitus, type 2 (HCC)    Type II  . DVT (deep venous thrombosis) ([place]) 03/2017   left popliteal  . Facet joint disease   . Facet joint disease    foot  . Fibula fracture 02/2017   left  . GERD (gastroesophageal reflux disease)   . Hyperlipidemia   . Hypertension   . OSA (obstructive sleep apnea)    does not use CPAP   . Osteopenia   . Shortness of breath    with exertion  . Venous stasis ulcers ([place])   . Vitamin D deficiency     Past Surgical History:  Procedure Laterality Date  . AV FISTULA PLACEMENT Right 08/20/2018   Procedure: ARTERIOVENOUS (AV) FISTULA CREATION BRACHIOCEPHALIC;  Surgeon: Angelia Mould, MD;  Location: [facility];  Service: Vascular;  Laterality: Right;  . BREAST SURGERY Right 03/1997   biospy  . CARPAL TUNNEL RELEASE Left 2016  . INSERTION OF MESH N/A 01/08/2013   Procedure: INSERTION OF MESH;  Surgeon: Earnstine Regal, MD;  Location: [facility];  Service: General;  Laterality: N/A;  . IR THORACENTESIS ASP PLEURAL SPACE W/IMG GUIDE  04/06/2017  . JOINT REPLACEMENT Right 1999  . TOTAL  KNEE ARTHROPLASTY Left 1999   Left  . TRIGGER FINGER RELEASE Left 11/2014  . TRIGGER FINGER RELEASE Right   . VENTRAL HERNIA REPAIR N/A 01/08/2013   Procedure: LAPAROSCOPIC VENTRAL HERNIA;  Surgeon: Earnstine Regal, MD;  Location[facility];  Service: General;  Laterality: N/A;    There were no vitals filed for this visit.   Subjective Assessment - 11/06/19 1443    Subjective  Jean Mcclain states that her legs have been swelling for about five years.  Her Lt leg is worse than her RT.  She was scratching her Lt LE and now she has a blister.  Jean Mcclain states that she was having someone come out to the house and wrapping her Left leg with a Unna boot but the insurance will not cover her treatment anylonger.  She states that she does not walk any longer due to pain in her ankle.  She has not been walking for about a year, prior to this she was walking with a walker.  She has had a wound on her Lt leg and went to the wound center a couple of years ago, they gave her some compression stocking but her legs continued  to be swollen.  At this time she does not know what lymphedema is or how it is managed.    Pertinent History  DM, gout, B lymphedema,    Limitations  Standing;Walking    How long can you sit comfortably?  no problem    How long can you stand comfortably?  only a few minutes    How long can you walk comfortably?  only a few steps    Patient Stated Goals  leg swelling to decrease to be more balanced    Currently in Pain?  Other (Comment)         11/06/19 0001  Assessment  Medical Diagnosis B lymphedema  Referring Provider (Jean) Titorya Stover  Onset Date/Surgical Date  (chronic )  Prior Therapy none  Precautions  Precautions Other (comment) (cellulitis)  Restrictions  Weight Bearing Restrictions No  Balance Screen  Has the patient fallen in the past 6 months No  Has the patient had a decrease in activity level because of a fear of falling?  Yes  Is the patient reluctant to leave their  home because of a fear of falling?  Yes  [place] residence  Prior Function  Level of Independence Independent  Observation/Other Assessments  Observations blister lateral Rt LE 3x1.5 cm , very dry flaky skin  (Jean Lt LE has bottleneck at ankle)  Focus on Therapeutic Outcomes (FOTO)   (life impact 50 out of 16 as 2 are N/A)       LYMPHEDEMA/ONCOLOGY QUESTIONNAIRE - 11/06/19 1536      Right Lower Extremity Lymphedema   30 cm Proximal to Floor at Lateral Plantar Foot  44.3 cm    20 cm Proximal to Floor at Lateral Plantar Foot  38.9 1    10  cm Proximal to Floor at Lateral Malleoli  27.5 cm    Circumference of ankle/heel  30.5 cm.    5 cm Proximal to 1st MTP Joint  21.5 cm    Across MTP Joint  22.5 cm      Left Lower Extremity Lymphedema   30 cm Proximal to Floor at Lateral Plantar Foot  46.6 cm    20 cm Proximal to Floor at Lateral Plantar Foot  46.2 cm    10 cm Proximal to Floor at Lateral Malleoli  34.8 cm    Circumference of ankle/heel  33.5 cm.    5 cm Proximal to 1st MTP Joint  22.5 cm    Across MTP Joint  22.1 cm    Around Proximal Great Toe  9.2 cm             Objective measurements completed on examination: See above findings.              Jean Education - 11/06/19 1634    Education Details  LE exercises, diapharagmic breathing. The need to cleanse and lotion LE on a daily basis.   Jean educated on Lymphedema, what it is, how it can be managed.  Jean has been dx with lymphedema but nobody has explained what is happening and its progression    Person(s) Educated  Patient    Methods  Explanation;Handout    Comprehension  Verbalized understanding       Jean Short Term Goals - 11/06/19 1700      Jean SHORT TERM GOAL #1   Title  Jean to understand the chronic and progressive nature of Lymphedema.    Time  2    Period  Weeks    Status  New    Target Date  11/20/19      Jean SHORT TERM GOAL #2   Title  Jean to be completing her  breathing and LE exercises at least one time a day to increase lymphatic circulation    Time  2    Period  Weeks      Jean SHORT TERM GOAL #3   Title  Blister on RT LE to be healed.    Time  2    Period  Weeks    Status  New        Jean Long Term Goals - 11/06/19 1701      Jean LONG TERM GOAL #1   Title  Jean to have lost 4cm of volume from LT LE< ; 2 from RT to allow donning of pants and shoes to be easier.    Time  4    Status  New    Target Date  12/04/19      Jean LONG TERM GOAL #2   Title  Jean to have and be using a compression pump for maintenance phase of treatment.    Time  4    Period  Weeks    Status  New      Jean LONG TERM GOAL #3   Title  Jean to have and be able to don Juxtafit B for maintenance phase of treatment.    Time  4    Period  Weeks    Status  New      Jean LONG TERM GOAL #4   Title  Due to decreased lymphatic load Jean to state that she has been able to walk more and easier in her apartment using her walker.    Time  4    Period  Weeks    Status  New             Plan - 11/06/19 1637    Clinical Impression Statement  Jean Mcclain is a 74 yo female who is currently basically wheelchair bound except for walking short distances ie into her bathroom at home.  She lives alone and has caregivers come in to assist her with housework/meals.  She has been dx with lymphedema for years and has had homehealth coming in placing unnaboots on her.  At this time her insurance will no longer pay for this and she was referred to skilled OP Jean for treatment for her lymphedema.  She currently has increased edema in B LE with B erythema with the Lt greater than the right.  There is a blister on the lateral aspect of the Jean Rt LE.  The Jean lives in [place] and has to pay someone to bring her to therapy.  She states if she could find a clinic that would treat her in [place] she could take the [place] but they [place] county [place].  At this time Jean Mcclain will benefit from skilled  Jean To continue to improve her knowledge of how to manage her lymphedema, manual decongestive techniques as well as compression bandaging.  Jean will benefit from a compression pump as well as juxtafit garments at discharge.  Therapist will fax MD order for these at this time.    Personal Factors and Comorbidities  Age;Finances;Fitness;Transportation;Time since onset of injury/illness/exacerbation;Past/Current Experience;Comorbidity 3+    Comorbidities  DM, HX of DVT, CHF, renal failure on dialysis.    Examination-Activity Limitations  Bathing;Bend;Carry;Dressing;Locomotion Level;Stairs;Stand    Examination-Participation Restrictions  Church;Cleaning;Community Activity;Driving;Laundry;Shop    Stability/Clinical Decision Making  Evolving/Moderate complexity    Clinical Decision Making  Moderate    Rehab Potential  Good    Jean Frequency  3x / week    Jean Duration  4 weeks    Jean Treatment/Interventions  Manual lymph drainage;Patient/family education;Therapeutic exercise;Compression bandaging    Jean Next Visit Plan  Begin manual lymph drainage, cut foam for LE, begin bandaging with short stretch as soon as bandages come in; use profore in the meantime    Jean Home Exercise Plan  Diapharagmic breathing, LE exercises       Patient will benefit from skilled therapeutic intervention in order to improve the following deficits and impairments:  Decreased skin integrity, Decreased range of motion, Decreased strength, Difficulty walking, Increased edema  Visit Diagnosis: Lymphedema, not elsewhere classified - Plan: Jean plan of care cert/re-cert  Muscle weakness (generalized) - Plan: Jean plan of care cert/re-cert  Unsteadiness on feet - Plan: Jean plan of care cert/re-cert     Problem List Patient Active Problem List   Diagnosis Date Noted  . OSA (obstructive sleep apnea) 03/13/2019  . Chronic acquired lymphedema 11/05/2018  . Acute respiratory distress   . Acute on chronic diastolic CHF (congestive heart  failure) ([facility])   . AKI (acute kidney injury) ([facility]) 03/30/2017  . Sepsis ([facility]) 03/30/2017  . Acute deep vein thrombosis (DVT) of popliteal vein of left lower extremity ([facility]) 03/11/2017  . Renal insufficiency 03/11/2017  . Insulin-requiring or dependent type II diabetes mellitus ([facility]) 03/11/2017  . Essential hypertension 03/11/2017  . Impingement syndrome of left ankle 01/31/2017  . Posterior tibial tendinitis, left leg 01/31/2017  . Incarcerated ventral hernia 01/02/2013    Jean Mcclain, Jean Mcclain [phone] 11/07/2019, 8:07 AM  [facility] [address] [place], [place], [address] Phone: [phone]   Fax:  [phone]  Name: Jean Mcclain MRN: [id number] Date of Birth: Nov 15, 1945

## 2019-11-08 ENCOUNTER — Encounter (HOSPITAL_COMMUNITY): Payer: Self-pay | Admitting: Physical Therapy

## 2019-11-08 ENCOUNTER — Telehealth: Payer: Self-pay | Admitting: Sports Medicine

## 2019-11-08 NOTE — Telephone Encounter (Signed)
Jean Mcclain, Care manager called stating pt is currently scheduled to go to PT in [place] 3 times a week and is curious if there is an option for her to have therapy in home or at a facility closer to [facility] due to her mobility issues.

## 2019-11-08 NOTE — Telephone Encounter (Signed)
Pt called about pt

## 2019-11-08 NOTE — Therapy (Signed)
[place], [place], [address] Phone: [phone]   Fax:  [phone]  Patient Details  Name: Jean Mcclain MRN: [id number] Date of Birth: Mar 31, 1946 Referring Provider:  No ref. provider found  Encounter Date: 11/08/2019   Called pt to let her know that [facility] lymphedema clinic could begin treatment with her on 12/02/2019.  Pt lives in [place] and has no transportation to this clinic, she would prefer to wait and start treatment at this time.  Therapist instructed pt to take the Profore bandages off on Tuesday.  Pt verbalized understanding.   Rayetta Humphrey, PT CLT [phone] 11/08/2019, 4:48 PM  [facility] [address] [place], [place], [address] Phone: [phone]   Fax:  [phone]

## 2019-11-10 DIAGNOSIS — E1022 Type 1 diabetes mellitus with diabetic chronic kidney disease: Secondary | ICD-10-CM | POA: Diagnosis not present

## 2019-11-10 DIAGNOSIS — N186 End stage renal disease: Secondary | ICD-10-CM | POA: Diagnosis not present

## 2019-11-10 DIAGNOSIS — Z992 Dependence on renal dialysis: Secondary | ICD-10-CM | POA: Diagnosis not present

## 2019-11-11 ENCOUNTER — Ambulatory Visit (HOSPITAL_COMMUNITY): Payer: HMO | Admitting: Physical Therapy

## 2019-11-11 ENCOUNTER — Encounter (HOSPITAL_COMMUNITY): Payer: Self-pay

## 2019-11-12 DIAGNOSIS — E1129 Type 2 diabetes mellitus with other diabetic kidney complication: Secondary | ICD-10-CM | POA: Diagnosis not present

## 2019-11-12 DIAGNOSIS — N186 End stage renal disease: Secondary | ICD-10-CM | POA: Diagnosis not present

## 2019-11-12 DIAGNOSIS — D509 Iron deficiency anemia, unspecified: Secondary | ICD-10-CM | POA: Diagnosis not present

## 2019-11-12 DIAGNOSIS — N2581 Secondary hyperparathyroidism of renal origin: Secondary | ICD-10-CM | POA: Diagnosis not present

## 2019-11-12 DIAGNOSIS — Z992 Dependence on renal dialysis: Secondary | ICD-10-CM | POA: Diagnosis not present

## 2019-11-12 DIAGNOSIS — D689 Coagulation defect, unspecified: Secondary | ICD-10-CM | POA: Diagnosis not present

## 2019-11-13 ENCOUNTER — Ambulatory Visit (HOSPITAL_COMMUNITY): Payer: HMO | Admitting: Physical Therapy

## 2019-11-14 ENCOUNTER — Ambulatory Visit: Payer: Self-pay

## 2019-11-15 ENCOUNTER — Ambulatory Visit (HOSPITAL_COMMUNITY): Payer: HMO | Admitting: Physical Therapy

## 2019-11-18 ENCOUNTER — Encounter (HOSPITAL_COMMUNITY): Payer: HMO | Admitting: Physical Therapy

## 2019-11-18 NOTE — Telephone Encounter (Signed)
Left message informing [place], Care Manager that pt would be going to PT facility in [place] for Lymphedema.

## 2019-11-20 ENCOUNTER — Ambulatory Visit (INDEPENDENT_AMBULATORY_CARE_PROVIDER_SITE_OTHER): Payer: HMO | Admitting: Neurology

## 2019-11-20 ENCOUNTER — Encounter: Payer: Self-pay | Admitting: Neurology

## 2019-11-20 ENCOUNTER — Encounter (HOSPITAL_COMMUNITY): Payer: HMO | Admitting: Physical Therapy

## 2019-11-20 ENCOUNTER — Other Ambulatory Visit: Payer: Self-pay

## 2019-11-20 VITALS — BP 128/72 | HR 88 | Temp 97.7°F

## 2019-11-20 DIAGNOSIS — Z992 Dependence on renal dialysis: Secondary | ICD-10-CM

## 2019-11-20 DIAGNOSIS — N19 Unspecified kidney failure: Secondary | ICD-10-CM | POA: Insufficient documentation

## 2019-11-20 DIAGNOSIS — N179 Acute kidney failure, unspecified: Secondary | ICD-10-CM

## 2019-11-20 DIAGNOSIS — I890 Lymphedema, not elsewhere classified: Secondary | ICD-10-CM

## 2019-11-20 DIAGNOSIS — N186 End stage renal disease: Secondary | ICD-10-CM | POA: Diagnosis not present

## 2019-11-20 DIAGNOSIS — G253 Myoclonus: Secondary | ICD-10-CM | POA: Diagnosis not present

## 2019-11-20 MED ORDER — ALPRAZOLAM 0.25 MG PO TABS: ORAL_TABLET | ORAL | 1 refills | Status: DC

## 2019-11-20 MED ORDER — HYDROXYZINE HCL 10 MG PO TABS: 10.0000 mg | ORAL_TABLET | Freq: Two times a day (BID) | ORAL | 5 refills | Status: DC | PRN

## 2019-11-20 MED ORDER — ROPINIROLE HCL ER 8 MG PO TB24: 8.0000 mg | ORAL_TABLET | Freq: Every day | ORAL | 1 refills | Status: DC

## 2019-11-20 NOTE — Progress Notes (Signed)
Provider:  Larey Seat, M D  Referring Provider: Leanna Battles, MD Primary Care Physician:  Leanna Battles, MD  Chief Complaint  Patient presents with  . New Patient (Initial Visit)    pt alone, rm 11. pt states that she is a dialysis patient. she has body pains all over. RLS and cramping. since being on dialysis, when she is on the dialysis the jerky motions and cramping will occur that it causes them to have to stop dialysis. she has a tremor that is noted in the upper extremities.     HPI:  Jean Mcclain is a 74 y.o. female is seen here upon a referral from [facility] for management of RLS.   Patient  here with care manager , she states that she is a hemodialysis patient. ESRD attributed to DM- obesity . She has a right arm fistula- and is right hand dominant.  She started new on HD just for a couple of month- has body pains all over. RLS and cramping. since being on dialysis, when she is on the dialysis the jerky motions and cramping will occur that it causes them to have to stop dialysis. she has noted abrupt jerking movement during HD treatment in the left upper extremity. She has dropped many things, from the right , dominant hand. She has reported sleep difficulties, itching all over, dry skin, dry mouth and on fluid restriction. She cramps every time when on dialysis, and has cut shot her treatments.   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  The patient takes several medications that have been used to treat what was perceived as restless leg syndrome but also affects her arms.  Has carpal tunnel.  At home she has taken carbidopa  levodopa for well Sinemet 10/1 100 mg 3 times a day she has taken colchicine for gout, she has taken cyclobenzaprine as a muscle relaxer to prevent spasms, Eliquis for blood thinning, lovastatin for cholesterol control, Novolin insulin 100 units 30 to 50 units twice a day.  Novolin regular insulin by sliding scale, omeprazole for acid reflux she is taking renal vitamins, she is taking carbonate Renvela as a protein phosphate binder, Uloric, ropinirole again for restless legs 4 mg oral and she has been has been on gabapentin 300 mg.  The notes from her renal specialist go back to 29 October.  She had restless legs for years. The abrupt movement of the arm is new since starting dialysis 07-05-2019.  Marland Kitchen  Has high ferritin levels.       Review of Systems: Out of a complete 14 system review, the patient complains of only the following symptoms, and all other reviewed systems are negative.  see above.   Social History   Socioeconomic History  . Marital status: Divorced    Spouse name: Not on file  . Number of children: Not on file  . Years of education: Not on file  . Highest education level: Not on file  Occupational History  . Not on file  Tobacco Use  . Smoking status: Former Smoker    Years: 27.00    Types: Cigarettes    Quit date: 10/10/1981  Years since quitting: 38.1  . Smokeless tobacco: Never Used  Substance and Sexual Activity  . Alcohol use: No  . Drug use: No  . Sexual activity: Not on file  Other Topics Concern  . Not on file  Social History Narrative  . Not on file   Social Determinants of Health   Financial Resource Strain:   . Difficulty of Paying Living Expenses: Not on file  Food Insecurity: No Food Insecurity  . Worried About Charity fundraiser in the Last Year: Never true  . Ran Out of Food in the Last Year: Never true  Transportation Needs: No Transportation Needs  . Lack of Transportation (Medical): No  . Lack of Transportation (Non-Medical): No    Physical Activity:   . Days of Exercise per Week: Not on file  . Minutes of Exercise per Session: Not on file  Stress:   . Feeling of Stress : Not on file  Social Connections:   . Frequency of Communication with Friends and Family: Not on file  . Frequency of Social Gatherings with Friends and Family: Not on file  . Attends Religious Services: Not on file  . Active Member of Clubs or Organizations: Not on file  . Attends Archivist Meetings: Not on file  . Marital Status: Not on file  Intimate Partner Violence:   . Fear of Current or Ex-Partner: Not on file  . Emotionally Abused: Not on file  . Physically Abused: Not on file  . Sexually Abused: Not on file    Family History  Problem Relation Age of Onset  . Leukemia Mother   . Hypertension Brother   . Emphysema Father     Past Medical History:  Diagnosis Date  . Anxiety   . Arthritis    osteo  . Chronic kidney disease   . Diabetes mellitus, type 2 (HCC)    Type II  . DVT (deep venous thrombosis) ([place]) 03/2017   left popliteal  . Facet joint disease   . Facet joint disease    foot  . Fibula fracture 02/2017   left  . GERD (gastroesophageal reflux disease)   . Hyperlipidemia   . Hypertension   . OSA (obstructive sleep apnea)    does not use CPAP   . Osteopenia   . Shortness of breath    with exertion  . Venous stasis ulcers ([facility])   . Vitamin D deficiency     Past Surgical History:  Procedure Laterality Date  . AV FISTULA PLACEMENT Right 08/20/2018   Procedure: ARTERIOVENOUS (AV) FISTULA CREATION BRACHIOCEPHALIC;  Surgeon: Angelia Mould, MD;  Location: [facility];  Service: Vascular;  Laterality: Right;  . BREAST SURGERY Right 03/1997   biospy  . CARPAL TUNNEL RELEASE Left 2016  . INSERTION OF MESH N/A 01/08/2013   Procedure: INSERTION OF MESH;  Surgeon: Earnstine Regal, MD;  [facility];  Service: General;  Laterality: N/A;  . IR THORACENTESIS ASP PLEURAL SPACE W/IMG GUIDE  04/06/2017  .  JOINT REPLACEMENT Right 1999  . TOTAL KNEE ARTHROPLASTY Left 1999   Left  . TRIGGER FINGER RELEASE Left 11/2014  . TRIGGER FINGER RELEASE Right   . VENTRAL HERNIA REPAIR N/A 01/08/2013   Procedure: LAPAROSCOPIC VENTRAL HERNIA;  Surgeon: Earnstine Regal, MD;  [facility];  Service: General;  Laterality: N/A;    Current Outpatient Medications  Medication Sig Dispense Refill  . acetaminophen (TYLENOL) 500 MG tablet Take 1,000 mg by mouth 3 (three)  times daily as needed for moderate pain or headache.    Marland Kitchen apixaban (ELIQUIS) 2.5 MG TABS tablet Take 2.5 mg by mouth 2 (two) times daily.    . colchicine 0.6 MG tablet Take 0.6 mg by mouth daily as needed (gout flare).     . cyclobenzaprine (FLEXERIL) 10 MG tablet Take 10 mg by mouth at bedtime.     . febuxostat (ULORIC) 40 MG tablet Take 40 mg by mouth daily.    Marland Kitchen gabapentin (NEURONTIN) 300 MG capsule Take 300 mg by mouth at bedtime.    Marland Kitchen glucose blood test strip OneTouch Verio test strips    . insulin NPH Human (HUMULIN N,NOVOLIN N) 100 UNIT/ML injection Inject 50 Units into the skin 2 (two) times daily.     . insulin regular (NOVOLIN R,HUMULIN R) 100 units/mL injection Inject 10-30 Units into the skin 3 (three) times daily before meals.     . Lancets (ONETOUCH DELICA PLUS [id number]) MISC     . lidocaine (LIDODERM) 5 % Place 1 patch onto the skin daily. Remove & Discard patch within 12 hours or as directed by MD 30 patch 0  . lidocaine-prilocaine (EMLA) cream Apply 1 application topically See admin instructions. 1 hour before dialysis to port    . lovastatin (MEVACOR) 40 MG tablet Take 40 mg by mouth at bedtime.     . multivitamin (RENA-VIT) TABS tablet Take 1 tablet by mouth daily.    Marland Kitchen omeprazole (PRILOSEC) 20 MG capsule Take 20 mg by mouth daily.    Marland Kitchen rOPINIRole (REQUIP) 4 MG tablet Take 4 mg by mouth 2 (two) times daily.     Marland Kitchen triamcinolone cream (KENALOG) 0.1 % Apply 1 application topically 2 (two) times daily as needed (itching).       Current Facility-Administered Medications  Medication Dose Route Frequency Provider Last Rate Last Admin  . betamethasone acetate-betamethasone sodium phosphate (CELESTONE) injection 3 mg  3 mg Intramuscular Once Edrick Kins, DPM        Allergies as of 11/20/2019  . (No Known Allergies)    Vitals: BP 128/72   Pulse 88   Temp 97.7 F (36.5 C)  Last Weight:  Wt Readings from Last 1 Encounters:  10/29/18 247 lb (112 kg)   Last Height:   Ht Readings from Last 1 Encounters:  10/29/18 5' (1.524 m)    Physical exam:  General: The patient is awake, alert and appears not in acute distress. The patient is  groomed. Head: Normocephalic, atraumatic.  Neck is supple. Mallampati 3, neck circumference: 18.  Cardiovascular:  Regular rate and rhythm- uthrill over the AV- fistula. , without  murmurs or carotid bruit, and without distended neck veins. Respiratory: Lungs are clear to auscultation. Skin:  Without evidence of edema, or rash Trunk: BMI is severely  elevated at 247 pounds and the patient is seated in a wheelchair.  Neurologic exam : The patient is awake and alert, oriented to place and time.  Memory subjective described as intact. There is a normal attention span & concentration ability. Speech is fluent with dysarthria, dysphonia . Mood and affect are appropriate.  Cranial nerves: Pupils are equal and briskly reactive to light. Funduscopic exam deferred,  without nystagmus.  Visual fields by finger perimetry are intact. Hearing to finger rub intact.  Facial sensation intact to fine touch. Facial motor strength is symmetric and tongue and uvula move midline. Tongue protrusion into either cheek is normal. Shoulder shrug is normal.   Motor exam:  Normal tone ,muscle bulk and symmetric  strength in all extremities.  Sensory:  Fine touch, pinprick and vibration were affected in all extremities.  Coordination: Rapid alternating movements in the fingers/hands were normal.  The  patient was unable to completely extend her upper extremities stating she has shoulder pain, elbow pain she has bilateral pronator drift.  Her grip strength was surprisingly equal in both hands also she had carpal tunnel surgery for the left and has an AV fistula placed on the right. She has low grip strength.  Finger-to-nose maneuver  normal without evidence of ataxia, dysmetria or tremor.  Gait and station: deferred.  Deep tendon reflexes: in the  upper and lower extremities are symmetric and absent- neuropathy. . Babinski maneuver response is deferred.   Assessment:  After physical and neurologic examination, review of laboratory studies, imaging, neurophysiology testing and pre-existing records, assessment is that of :   This restless leg syndrome is closely correlated to both findings of diabetes mellitus, and end-stage renal disease.  Her symptoms proceed with the hemodialysis treatment labs during hemodialysis there is a new component of myoclonic jerks noted.  There is also more cramping which is not uncommon in dialysis patients.  Looking at her current medication the most likely agents to help her restless leg syndrome have already been tried.    I think it would be up to trying an established medication at a higher dose to see if we can reduce the myoclonic jerks.  I would not like to increase gabapentin due to her very limited renal function, but I think we can use Requip as an extended release form in PM  after HD treatments, or as a patch,   And may add xanax prn,   hydroxizine for itching.   Plan:  Treatment plan and additional workup :   I think it would be up to trying an antiepileptic medication to see if we can reduce the myoclonic jerks.  I would not like to increase gabapentin due to her very limited renal function, but I think we can use Requip as an extended release form and only after HD treatments, or as a patch,   or increase in sinemet 25/ 100 tid.     I decided to  recommend an extended Requip XR daily  after dinner. RV in 3-6 month with NP.    Asencion Partridge Luby Seamans MD 11/20/2019

## 2019-11-20 NOTE — Patient Instructions (Signed)
Eating Plan for Dialysis Dialysis is a treatment that cleans your blood. It is used when your kidneys are damaged. When you need dialysis, you should watch what you eat. This is because some nutrients can build up in your blood between treatments and make you sick. Your doctor or diet specialist (dietitian) will:  Tell you what nutrients you should include or avoid.  Tell you how much of these nutrients you should get each day.  Help you plan meals.  Tell you how much to drink each day. What are tips for following this plan? Reading food labels  Check food labels for: ? Potassium. This is found in milk, fruits, and vegetables. ? Phosphorus. This is found in milk, cheese, beans, nuts, and carbonated beverages. ? Salt (sodium). This is in processed meats, cured meats, ready-made frozen meals, canned vegetables, and salty snack foods.  Try to find foods that are low in potassium, phosphorus, and sodium.  Look for foods that are labeled "sodium free," "reduced sodium," or "low sodium." Shopping  Do not buy whole-grain and high-fiber foods.  Do not buy or use salt substitutes.  Do not buy processed foods. Cooking  Drain all fluid from cooked vegetables and canned fruits before you eat them.  Before you cook potatoes, cut them into small pieces. Then boil them in unsalted water.  Try using herbs and spices that do not contain sodium to add flavor. Meal planning Most people on dialysis should try to eat:  6-11 servings of grains each day. One serving is equal to 1 slice of bread or  cup of cooked rice or pasta.  2-3 servings of low-potassium vegetables each day. One serving is equal to  cup.  2-3 servings of low-potassium fruits each day. One serving is equal to  cup.  Protein, such as meat, poultry, fish, and eggs. Talk with your doctor or dietitian about the right amount and type of protein to eat.   cup of dairy each day. General information  Follow your doctor's  instructions about how much to drink. You may be told to: ? Write down what you drink. ? Write down the foods you eat that are made mostly from water, such as gelatin and soups. ? Drink from small cups.  Take vitamin and mineral supplements only as told by your doctor.  Take over-the-counter and prescription medicines only as told by your doctor. What foods can I eat?     Fruits Apples. Fresh or frozen berries. Fresh or canned pears, peaches, and pineapple. Grapes. Plums. Vegetables Fresh or frozen broccoli, carrots, and green beans. Cabbage. Cauliflower. Celery. Cucumbers. Eggplant. Radishes. Zucchini. Grains White bread. White rice. Cooked cereal. Unsalted popcorn. Tortillas. Pasta. Meats and other proteins Fresh or frozen beef, pork, chicken, and fish. Eggs. Dairy Cream cheese. Heavy cream. Ricotta cheese. Beverages Apple cider. Cranberry juice. Grape juice. Lemonade. Black coffee. Rice milk (that is not enriched or fortified). Seasonings and condiments Herbs. Spices. Jam and jelly. Honey. Sweets and desserts Sherbet. Cakes. Cookies. Fats and oils Olive oil, canola oil, and safflower oil. Other foods Non-dairy creamer. Non-dairy whipped topping. Homemade broth without salt. The items listed above may not be a complete list of foods and beverages you can eat. Contact your dietitian for more options. What foods should I avoid? Fruits Star fruit. Bananas. Oranges. Kiwi. Nectarines. Prunes. Melon. Dried fruit. Avocado. Vegetables Potatoes. Beets. Tomatoes. Winter squash and pumpkin. Asparagus. Spinach. Parsnips. Grains Whole-grain bread. Whole-grain pasta. High-fiber cereal. Meats and other proteins Canned, smoked, and   cured meats. Packaged lunch meat. Sardines. Nuts and seeds. Peanut butter. Beans and legumes. Dairy Milk. Buttermilk. Yogurt. Cheese and cottage cheese. Processed cheese spreads. Beverages Orange juice. Prune juice. Carbonated soft drinks. Seasonings and  condiments Salt. Salt substitutes. Soy sauce. Sweets and desserts Ice cream. Chocolate. Candied nuts. Fats and oils Butter. Margarine. Other foods Ready-made frozen meals. Canned soups. The items listed above may not be a complete list of foods and beverages you should avoid. Contact your dietitian for more information. Summary  If you are having dialysis, it is important to watch what you eat. Certain nutrients and wastes can build up in your blood and cause you to get sick.  Your dietitian will help you make an eating plan that meets your needs.  Avoid foods that are high in potassium, salt (sodium), and phosphorus. Restrict fluids as told by your doctor or dietitian. This information is not intended to replace advice given to you by your health care provider. Make sure you discuss any questions you have with your health care provider. Document Revised: 12/13/2017 Document Reviewed: 09/27/2017 Elsevier Patient Education  2020 Elsevier Inc.  

## 2019-11-22 ENCOUNTER — Encounter (HOSPITAL_COMMUNITY): Payer: HMO | Admitting: Physical Therapy

## 2019-11-25 ENCOUNTER — Encounter (HOSPITAL_COMMUNITY): Payer: HMO | Admitting: Physical Therapy

## 2019-11-25 DIAGNOSIS — R252 Cramp and spasm: Secondary | ICD-10-CM | POA: Diagnosis not present

## 2019-11-25 DIAGNOSIS — E662 Morbid (severe) obesity with alveolar hypoventilation: Secondary | ICD-10-CM | POA: Diagnosis not present

## 2019-11-25 DIAGNOSIS — G4733 Obstructive sleep apnea (adult) (pediatric): Secondary | ICD-10-CM | POA: Diagnosis not present

## 2019-11-25 DIAGNOSIS — E1129 Type 2 diabetes mellitus with other diabetic kidney complication: Secondary | ICD-10-CM | POA: Diagnosis not present

## 2019-11-25 DIAGNOSIS — N185 Chronic kidney disease, stage 5: Secondary | ICD-10-CM | POA: Diagnosis not present

## 2019-11-25 DIAGNOSIS — E1140 Type 2 diabetes mellitus with diabetic neuropathy, unspecified: Secondary | ICD-10-CM | POA: Diagnosis not present

## 2019-11-27 ENCOUNTER — Encounter (HOSPITAL_COMMUNITY): Payer: HMO | Admitting: Physical Therapy

## 2019-11-27 ENCOUNTER — Other Ambulatory Visit: Payer: Self-pay

## 2019-11-27 NOTE — Patient Outreach (Signed)
  [facility]) Care Management Chronic Special Needs Program  11/27/2019  Name: Jean Mcclain DOB: [birth year]  MRN: [id number]  Jean Mcclain is enrolled in a chronic special needs plan for Diabetes. Client called with no answer No answer and HIPAA compliant message left. 1st attempt Plan for 2nd outreach call in one week Chronic care management coordinator will attempt outreach in one week.   Peter Garter RN, Jackquline Denmark, CDE Chronic Care Management Coordinator [facility] Management [phone]

## 2019-11-29 ENCOUNTER — Encounter (HOSPITAL_COMMUNITY): Payer: HMO | Admitting: Physical Therapy

## 2019-12-02 ENCOUNTER — Encounter (HOSPITAL_COMMUNITY): Payer: HMO | Admitting: Physical Therapy

## 2019-12-02 ENCOUNTER — Ambulatory Visit: Payer: HMO | Attending: Sports Medicine

## 2019-12-02 ENCOUNTER — Other Ambulatory Visit: Payer: Self-pay

## 2019-12-02 DIAGNOSIS — R2681 Unsteadiness on feet: Secondary | ICD-10-CM | POA: Insufficient documentation

## 2019-12-02 DIAGNOSIS — I890 Lymphedema, not elsewhere classified: Secondary | ICD-10-CM

## 2019-12-02 DIAGNOSIS — R2689 Other abnormalities of gait and mobility: Secondary | ICD-10-CM | POA: Diagnosis not present

## 2019-12-02 DIAGNOSIS — M6281 Muscle weakness (generalized): Secondary | ICD-10-CM

## 2019-12-02 NOTE — Therapy (Signed)
[place], [place], [address] Phone: [phone]   Fax:  [phone]  Physical Therapy Progress Note  Progress Note Reporting Period 11/06/19 to 12/02/19  See note below for Objective Data and Assessment of Progress/Goals.   Pt only had 1 visit due to transportation issues. She is at a clinic that is closer now and will be able to come consistently.    Patient Details  Name: Jean Mcclain MRN: [id number] Date of Birth: 03-20-1946 Referring Provider (PT): Landis Martins   Encounter Date: 12/02/2019  PT End of Session - 12/02/19 1310    Visit Number  2    Number of Visits  12    Date for PT Re-Evaluation  01/20/20    Authorization Type  [employer]    Authorization - Visit Number  2    Authorization - Number of Visits  10    PT Start Time  1306    PT Stop Time  1400    PT Time Calculation (min)  54 min    Activity Tolerance  Patient tolerated treatment well    Behavior During Therapy  [facility] for tasks assessed/performed       Past Medical History:  Diagnosis Date  . Anxiety   . Arthritis    osteo  . Chronic kidney disease   . Diabetes mellitus, type 2 (HCC)    Type II  . DVT (deep venous thrombosis) ([place]) 03/2017   left popliteal  . Facet joint disease   . Facet joint disease    foot  . Fibula fracture 02/2017   left  . GERD (gastroesophageal reflux disease)   . Hyperlipidemia   . Hypertension   . OSA (obstructive sleep apnea)    does not use CPAP   . Osteopenia   . Shortness of breath    with exertion  . Venous stasis ulcers ([place])   . Vitamin D deficiency     Past Surgical History:  Procedure Laterality Date  . AV FISTULA PLACEMENT Right 08/20/2018   Procedure: ARTERIOVENOUS (AV) FISTULA CREATION BRACHIOCEPHALIC;  Surgeon: Angelia Mould, MD;  Location: [facility];  Service: Vascular;  Laterality: Right;  . BREAST SURGERY Right 03/1997   biospy  . CARPAL TUNNEL  RELEASE Left 2016  . INSERTION OF MESH N/A 01/08/2013   Procedure: INSERTION OF MESH;  Surgeon: Earnstine Regal, MD;  Location[facility];  Service: General;  Laterality: N/A;  . IR THORACENTESIS ASP PLEURAL SPACE W/IMG GUIDE  04/06/2017  . JOINT REPLACEMENT Right 1999  . TOTAL KNEE ARTHROPLASTY Left 1999   Left  . TRIGGER FINGER RELEASE Left 11/2014  . TRIGGER FINGER RELEASE Right   . VENTRAL HERNIA REPAIR N/A 01/08/2013   Procedure: LAPAROSCOPIC VENTRAL HERNIA;  Surgeon: Earnstine Regal, MD;  Location: [facility];  Service: General;  Laterality: N/A;    There were no vitals filed for this visit.  Subjective Assessment - 12/02/19 1311    Subjective  Pt reports that she went to physical therapy about 1 month ago. She states that her legs were washed and wrapped but she has not been wrapped since that time. Pt reports that her knees have started to really bother her lately.    Pertinent History  DM, gout, B lymphedema,    Limitations  Standing;Walking    How long can you sit comfortably?  no problem    How long can you stand comfortably?  only a few minutes  How long can you walk comfortably?  only a few steps    Patient Stated Goals  leg swelling to decrease to be more balanced    Currently in Pain?  Yes    Pain Score  8                        [facility] Adult PT Treatment/Exercise - 12/02/19 0001      Manual Therapy   Manual Therapy  Compression Bandaging    Manual therapy comments  pt Bil LE were washed prior to compression wrap donning and thick lotion was applied.     Compression Bandaging  compression bandaging knee high to both legs using 1 artiflex on the R and 2 on the L with TG soft as a base layer and 1 8 cm for roman sandal and anlke/sole/heel then 2 10 cm for spiral wrap up the leg to the knee bil.              PT Education - 12/02/19 1402    Education Details  Pt was provided with information on when to remove bandages. Discussed compression garments and what  might be appropriate for her due to profore was very effective in managing her swelling due to most likely phlebolymphedema rather than true lymphedema. Discussed possible use of a pump and which pump may be most appropriate due to pt mobility deficits.    Person(s) Educated  Patient    Methods  Explanation;Handout    Comprehension  Verbalized understanding       PT Short Term Goals - 11/06/19 1700      PT SHORT TERM GOAL #1   Title  PT to understand the chronic and progressive nature of Lymphedema.    Time  2    Period  Weeks    Status  New    Target Date  11/20/19      PT SHORT TERM GOAL #2   Title  PT to be completing her breathing and LE exercises at least one time a day to increase lymphatic circulation    Time  2    Period  Weeks      PT SHORT TERM GOAL #3   Title  Blister on RT LE to be healed.    Time  2    Period  Weeks    Status  New        PT Long Term Goals - 11/06/19 1701      PT LONG TERM GOAL #1   Title  PT to have lost 4cm of volume from LT LE< ; 2 from RT to allow donning of pants and shoes to be easier.    Time  4    Status  New    Target Date  12/04/19      PT LONG TERM GOAL #2   Title  Pt to have and be using a compression pump for maintenance phase of treatment.    Time  4    Period  Weeks    Status  New      PT LONG TERM GOAL #3   Title  PT to have and be able to don Juxtafit B for maintenance phase of treatment.    Time  4    Period  Weeks    Status  New      PT LONG TERM GOAL #4   Title  Due to decreased lymphatic load pt to state that she has been  able to walk more and easier in her apartment using her walker.    Time  4    Period  Weeks    Status  New            Plan - 12/02/19 1310    Clinical Impression Statement  Pt presents to physical therapy services after about 1 month. She is wheelchair bound and states that she leaves her shoes on all the time even at night. She reports that she takes sponge baths in her chair. She  lives alone and states that she no longer has a caregiver that comes into her home. Pt continues with edema in Bil LE in the lower legs with erythema in both legs with L greater than R. She was doing well with just polyfore that was placed on her LE at her last physical therapy session which she had not removed. Re-iterated education on importance of removing wraps if needed. Pt is able to don her socks so stated that if need be she can remove wraps it is just painful to reach down. Discussed compression options and pump options with patient due to pt mobity issues including possible Biotab pump and edema wear for compression due to pt responding well to polyfore. Pt will benefit from skilled physical therapy services 2x/week for 6 weeks in order to decrease risk for further immobility and infection related to lymphedema.    Personal Factors and Comorbidities  Age;Finances;Fitness;Transportation;Time since onset of injury/illness/exacerbation;Past/Current Experience;Comorbidity 3+    Comorbidities  DM, HX of DVT, CHF, renal failure on dialysis.    Examination-Activity Limitations  Bathing;Bend;Carry;Dressing;Locomotion Level;Stairs;Stand    Examination-Participation Restrictions  Church;Cleaning;Community Activity;Driving;Laundry;Shop    Rehab Potential  Good    PT Frequency  2x / week    PT Duration  6 weeks    PT Treatment/Interventions  Manual lymph drainage;Patient/family education;Therapeutic exercise;Compression bandaging    PT Next Visit Plan  Continue with wrapping and edema control.    PT Home Exercise Plan  Diapharagmic breathing, LE exercises    Recommended Other Services  Biotab pump and edema wear.       Patient will benefit from skilled therapeutic intervention in order to improve the following deficits and impairments:  Decreased skin integrity, Decreased range of motion, Decreased strength, Difficulty walking, Increased edema  Visit Diagnosis: Lymphedema, not elsewhere  classified  Muscle weakness (generalized)  Unsteadiness on feet  Other abnormalities of gait and mobility     Problem List Patient Active Problem List   Diagnosis Date Noted  . ESRD on hemodialysis ([place]) 11/20/2019  . Uremia of renal origin 11/20/2019  . Myoclonus, segmental 11/20/2019  . OSA (obstructive sleep apnea) 03/13/2019  . Chronic acquired lymphedema 11/05/2018  . Acute respiratory distress   . Acute on chronic diastolic CHF (congestive heart failure) ([place])   . AKI (acute kidney injury) ([place]) 03/30/2017  . Sepsis ([place]) 03/30/2017  . Acute deep vein thrombosis (DVT) of popliteal vein of left lower extremity ([place]) 03/11/2017  . Renal insufficiency 03/11/2017  . Insulin-requiring or dependent type II diabetes mellitus ([place]) 03/11/2017  . Essential hypertension 03/11/2017  . Impingement syndrome of left ankle 01/31/2017  . Posterior tibial tendinitis, left leg 01/31/2017  . Incarcerated ventral hernia 01/02/2013    Ander Purpura, PT 12/02/2019, 2:48 PM  [place], [place], [address] Phone: [phone]   Fax:  [phone]  Name: ARRIONNA SERENA MRN: [id number] Date of Birth: 1946-07-24

## 2019-12-02 NOTE — Patient Instructions (Signed)

## 2019-12-04 ENCOUNTER — Encounter (HOSPITAL_COMMUNITY): Payer: HMO | Admitting: Physical Therapy

## 2019-12-04 ENCOUNTER — Other Ambulatory Visit: Payer: Self-pay

## 2019-12-04 ENCOUNTER — Ambulatory Visit: Payer: HMO

## 2019-12-04 DIAGNOSIS — I890 Lymphedema, not elsewhere classified: Secondary | ICD-10-CM | POA: Diagnosis not present

## 2019-12-04 DIAGNOSIS — R2681 Unsteadiness on feet: Secondary | ICD-10-CM

## 2019-12-04 DIAGNOSIS — R2689 Other abnormalities of gait and mobility: Secondary | ICD-10-CM

## 2019-12-04 DIAGNOSIS — M6281 Muscle weakness (generalized): Secondary | ICD-10-CM

## 2019-12-04 NOTE — Therapy (Signed)
[place], [place], [address] Phone: [phone]   Fax:  [phone]  Physical Therapy Treatment  Patient Details  Name: Jean Mcclain MRN: [id number] Date of Birth: 01-28-1946 Referring Provider (PT): Landis Martins   Encounter Date: 12/04/2019  PT End of Session - 12/04/19 1503    Visit Number  3    Number of Visits  12    Date for PT Re-Evaluation  01/20/20    Authorization Type  [employer]    Authorization - Visit Number  3    Authorization - Number of Visits  10    PT Start Time  4097    PT Stop Time  1450    PT Time Calculation (min)  43 min    Activity Tolerance  Patient tolerated treatment well    Behavior During Therapy  [facility] for tasks assessed/performed       Past Medical History:  Diagnosis Date  . Anxiety   . Arthritis    osteo  . Chronic kidney disease   . Diabetes mellitus, type 2 (HCC)    Type II  . DVT (deep venous thrombosis) ([place]) 03/2017   left popliteal  . Facet joint disease   . Facet joint disease    foot  . Fibula fracture 02/2017   left  . GERD (gastroesophageal reflux disease)   . Hyperlipidemia   . Hypertension   . OSA (obstructive sleep apnea)    does not use CPAP   . Osteopenia   . Shortness of breath    with exertion  . Venous stasis ulcers ([place])   . Vitamin D deficiency     Past Surgical History:  Procedure Laterality Date  . AV FISTULA PLACEMENT Right 08/20/2018   Procedure: ARTERIOVENOUS (AV) FISTULA CREATION BRACHIOCEPHALIC;  Surgeon: Angelia Mould, MD;  Location: [facility];  Service: Vascular;  Laterality: Right;  . BREAST SURGERY Right 03/1997   biospy  . CARPAL TUNNEL RELEASE Left 2016  . INSERTION OF MESH N/A 01/08/2013   Procedure: INSERTION OF MESH;  Surgeon: Earnstine Regal, MD;  [facility];  Service: General;  Laterality: N/A;  . IR THORACENTESIS ASP PLEURAL SPACE W/IMG GUIDE  04/06/2017  . JOINT REPLACEMENT Right  1999  . TOTAL KNEE ARTHROPLASTY Left 1999   Left  . TRIGGER FINGER RELEASE Left 11/2014  . TRIGGER FINGER RELEASE Right   . VENTRAL HERNIA REPAIR N/A 01/08/2013   Procedure: LAPAROSCOPIC VENTRAL HERNIA;  Surgeon: Earnstine Regal, MD;  [facility];  Service: General;  Laterality: N/A;    There were no vitals filed for this visit.  Subjective Assessment - 12/04/19 1501    Subjective  Pt states that she was a litlte itchy under her knees but did not have any other difficulty with her wraps.    Pertinent History  DM, gout, B lymphedema,    Limitations  Standing;Walking    How long can you sit comfortably?  no problem    How long can you stand comfortably?  only a few minutes    How long can you walk comfortably?  only a few steps    Patient Stated Goals  leg swelling to decrease to be more balanced    Currently in Pain?  Yes    Pain Score  8                        [facility] Adult PT Treatment/Exercise -  12/04/19 0001      Manual Therapy   Manual Therapy  Compression Bandaging    Manual therapy comments  pt Bil LE were washed prior to compression wrap donning and thick lotion was applied.     Compression Bandaging  compression bandaging knee high to both legs using 1 artiflex on the R and 2 on the L folding artiflex at the distal lateral aspect for improved shaping with TG soft as a base layer and 1 8 cm for roman sandal and anlke/sole/heel then 2 10 cm for spiral wrap up the leg to the knee bil.              PT Education - 12/04/19 1503    Education Details  re-iterated education on when to remove wraps including with any pain, tingling or numbness. Discussed not using finger nails to scratch in order to decrease risk for infection.    Person(s) Educated  Patient    Methods  Explanation    Comprehension  Verbalized understanding       PT Short Term Goals - 11/06/19 1700      PT SHORT TERM GOAL #1   Title  PT to understand the chronic and progressive nature of  Lymphedema.    Time  2    Period  Weeks    Status  New    Target Date  11/20/19      PT SHORT TERM GOAL #2   Title  PT to be completing her breathing and LE exercises at least one time a day to increase lymphatic circulation    Time  2    Period  Weeks      PT SHORT TERM GOAL #3   Title  Blister on RT LE to be healed.    Time  2    Period  Weeks    Status  New        PT Long Term Goals - 11/06/19 1701      PT LONG TERM GOAL #1   Title  PT to have lost 4cm of volume from LT LE< ; 2 from RT to allow donning of pants and shoes to be easier.    Time  4    Status  New    Target Date  12/04/19      PT LONG TERM GOAL #2   Title  Pt to have and be using a compression pump for maintenance phase of treatment.    Time  4    Period  Weeks    Status  New      PT LONG TERM GOAL #3   Title  PT to have and be able to don Juxtafit B for maintenance phase of treatment.    Time  4    Period  Weeks    Status  New      PT LONG TERM GOAL #4   Title  Due to decreased lymphatic load pt to state that she has been able to walk more and easier in her apartment using her walker.    Time  4    Period  Weeks    Status  New            Plan - 12/04/19 1504    Clinical Impression Statement  Pt presents to physical therapy with her compression wrapping on from her last session. Improvement in the L leg coloration; decreased rubor. Improvement in edema in the RLE and slight improvement in the L. Pt was  wrapped this session with 3 short stretch bandages on BIl legs. No toe wraps were used again this session due to no significant increase in edema in the toes following her wrap last session without toe bandages. Only one bandages was used for foot and ankle wraps so that pt is able to don her shoes due to pt has difficulty standing and needs traction. Pt will benefit from continued POC at this time.    Personal Factors and Comorbidities  Age;Finances;Fitness;Transportation;Time since onset of  injury/illness/exacerbation;Past/Current Experience;Comorbidity 3+    Comorbidities  DM, HX of DVT, CHF, renal failure on dialysis.    Examination-Activity Limitations  Bathing;Bend;Carry;Dressing;Locomotion Level;Stairs;Stand    Examination-Participation Restrictions  Church;Cleaning;Community Activity;Driving;Laundry;Shop    Rehab Potential  Good    PT Frequency  2x / week    PT Duration  6 weeks    PT Treatment/Interventions  Manual lymph drainage;Patient/family education;Therapeutic exercise;Compression bandaging    PT Next Visit Plan  ask  about exercises, Continue with wrapping and edema control.    PT Home Exercise Plan  Diapharagmic breathing, LE exercises    Consulted and Agree with Plan of Care  Patient       Patient will benefit from skilled therapeutic intervention in order to improve the following deficits and impairments:  Decreased skin integrity, Decreased range of motion, Decreased strength, Difficulty walking, Increased edema  Visit Diagnosis: Lymphedema, not elsewhere classified  Muscle weakness (generalized)  Unsteadiness on feet  Other abnormalities of gait and mobility     Problem List Patient Active Problem List   Diagnosis Date Noted  . ESRD on hemodialysis ([place]) 11/20/2019  . Uremia of renal origin 11/20/2019  . Myoclonus, segmental 11/20/2019  . OSA (obstructive sleep apnea) 03/13/2019  . Chronic acquired lymphedema 11/05/2018  . Acute respiratory distress   . Acute on chronic diastolic CHF (congestive heart failure) ([place])   . AKI (acute kidney injury) ([place]) 03/30/2017  . Sepsis ([place]) 03/30/2017  . Acute deep vein thrombosis (DVT) of popliteal vein of left lower extremity ([place]) 03/11/2017  . Renal insufficiency 03/11/2017  . Insulin-requiring or dependent type II diabetes mellitus ([place]) 03/11/2017  . Essential hypertension 03/11/2017  . Impingement syndrome of left ankle 01/31/2017  . Posterior tibial tendinitis, left leg 01/31/2017  .  Incarcerated ventral hernia 01/02/2013    Ander Purpura , PT 12/04/2019, 3:07 PM  [place], [place], [address] Phone: [phone]   Fax:  [phone]  Name: Jean Mcclain MRN: [id number] Date of Birth: 01-24-1946

## 2019-12-05 ENCOUNTER — Ambulatory Visit: Payer: Self-pay

## 2019-12-05 DIAGNOSIS — Z87892 Personal history of anaphylaxis: Secondary | ICD-10-CM | POA: Insufficient documentation

## 2019-12-05 DIAGNOSIS — N186 End stage renal disease: Secondary | ICD-10-CM | POA: Diagnosis not present

## 2019-12-05 DIAGNOSIS — L299 Pruritus, unspecified: Secondary | ICD-10-CM | POA: Insufficient documentation

## 2019-12-05 DIAGNOSIS — D689 Coagulation defect, unspecified: Secondary | ICD-10-CM | POA: Diagnosis not present

## 2019-12-05 DIAGNOSIS — Z992 Dependence on renal dialysis: Secondary | ICD-10-CM | POA: Diagnosis not present

## 2019-12-06 ENCOUNTER — Other Ambulatory Visit: Payer: Self-pay

## 2019-12-06 ENCOUNTER — Encounter (HOSPITAL_COMMUNITY): Payer: HMO | Admitting: Physical Therapy

## 2019-12-06 NOTE — Patient Outreach (Signed)
[facility]) Care Management Chronic Special Needs Program  12/06/2019  Name: Jean Mcclain DOB: [birth year]  MRN: [id number]  Ms. Jean Mcclain is enrolled in a chronic special needs plan for Diabetes. Chronic Care Management Coordinator telephoned client to review health risk assessment and to develop individualized care plan.  Introduced the chronic care management program, importance of client participation, and taking their care plan to all provider appointments and inpatient facilities.  Reviewed the transition of care process and possible referral to community care management.  Subjective: Client states that she is going to dialysis 3 times a week now.  States she has [employer] to take her to appointments and shopping.  States she is still seeing or talking on the phone with the [facility] people and she know when and how to call them.  States her diabetes has been in good control.  States her blood sugars range from 68-120 most of the time.  States her only low readings are in the morning when she gets up and she eats breakfast to make them go up.  States this only happens a few times a month.  States she has been looking to move into another apartment with independent living and meals but has not decided yet if she will move. States she has been working with the [employer] for housing and to get her aide. States her chronic pain in her hip is unchanged.  States she has not seen an orthopedic doctor in several years as they told her she needed to lose weight before they could do surgery on her.  States she tires to follow her diet but she is so limited on what she can eat.  States she would like to talk to the [employer] health coach to see if she can give her any new ideas on diet and weight loss.  Client states she does not have heart failure and does not need to see a cardiologist.  States her B/P is good when checked at dialysis.  States she does not want  information on Advanced Directives.  Denies any issues with taking medications or affording her medications.  States she plans to get her COVID shot at dialysis when they have them  Goals Addressed            This Visit's Progress   . Client will report improved coping of chronic pain in the next 6 months       Instructed to consider going back to orthopedic specialist to see if her pain control options have changed Reviewed non medication pain control techniques    . Client will use Assistive Devices as needed and verbalize understanding of device use   On track    Reports no issues with her glucometer and other home equipment    . Client will verbalize knowledge of self management of Hypertension as evidences by BP reading of 140/90 or less; or as defined by provider   On track    Reports B/P less than 140/90 on provider visits and at dialysis Plan to check B/P regularly Take B/P medications as ordered Plan to follow a low salt renal diet  EMMI: High Blood Pressure(Hypertension) What you can do    . Diabetes Patient stated goal to talk with health coach about weight loss in the next 6 months (pt-stated)       Discussed the [employer] health coach and how she can help motivate her to lose weight Plan to  communicate with [employer] Health Coach of clients wish to speak with her about weight loss Send EMMI Weight loss tips    . HEMOGLOBIN A1C < 7.0       Last result 09/16/19 5.9% Reviewed fasting blood sugar goals of 80-130 and less than 180 1 1/2-2 hours after meals Reviewed signs and symptoms of hypoglycemia and actions to take Reinforced to follow a low carbohydrate low salt , renal diet and to watch portion sizes Instructed to review handouts diabetes checklist and diabetes-controlling blood sugar    . Maintain timely refills of diabetic medication as prescribed within the year .   On track    Maintaining timely refills of medications per dispense  report Reinforced importance of getting medications refilled on time    . Obtain annual  Lipid Profile, LDL-C   On track    Last completed 02/04/19 LDL 68 The goal for LDL is less than 70 mg/dL as you are at high risk for complications Plan to take statin as ordered    . Obtain Annual Eye (retinal)  Exam    On track    Completed by [facility] in 2020 Plan to have a dilated eye exam every year    . COMPLETED: Obtain Annual Foot Exam       Last completed 12/11/18 Check your skin and feet every day for cuts, bruises, redness, blisters, or sores. Schedule a foot exam with your health care provider once every year    . Obtain Hemoglobin A1C at least 2 times per year   On track    Last completed 09/16/19 It is important to have your Hemoglobin A1C checked every 6 months if you are at goal and every 3 months if you are not at goal    . Visit Primary Care Provider or Endocrinologist at least 2 times per year    On track    Primary care provider 11/25/19 Endocrinologist in 09/16/19 next visit scheduled in June Please schedule your annual wellness visit     Client is meeting diabetes self management goal of hemoglobin A1C of <7% with last reading of 5.9% Client is followed by [facility] for complex management with last provider visit on 09/18/19 and next visit scheduled on 12/13/19.  Client has worked with Data processing manager for [employer] for  pharmacy review Client is going to dialysis 3 times a week  Client is agreeable to referral to [employer] Engineer, maintenance for weight loss assistance Plan to review [facility]'s plan of care and will collaborated with [facility] team as needed  Plan:  Send successful outreach letter with a copy of their individualized care plan, Send individual care plan to provider and Send educational material-EMMI-High Blood Pressure: What you can do, Chronic pain, Weight loss tips  Chronic care management coordination will  outreach in:  6 Months  Will refer client to:  [employer] Gonzales RN, [facility] [place] Management [phone]

## 2019-12-08 DIAGNOSIS — I5033 Acute on chronic diastolic (congestive) heart failure: Secondary | ICD-10-CM | POA: Diagnosis not present

## 2019-12-08 DIAGNOSIS — E1022 Type 1 diabetes mellitus with diabetic chronic kidney disease: Secondary | ICD-10-CM | POA: Diagnosis not present

## 2019-12-08 DIAGNOSIS — N186 End stage renal disease: Secondary | ICD-10-CM | POA: Diagnosis not present

## 2019-12-08 DIAGNOSIS — J9602 Acute respiratory failure with hypercapnia: Secondary | ICD-10-CM | POA: Diagnosis not present

## 2019-12-08 DIAGNOSIS — Z992 Dependence on renal dialysis: Secondary | ICD-10-CM | POA: Diagnosis not present

## 2019-12-09 ENCOUNTER — Encounter (HOSPITAL_COMMUNITY): Payer: HMO | Admitting: Physical Therapy

## 2019-12-09 ENCOUNTER — Ambulatory Visit: Payer: HMO | Attending: Internal Medicine

## 2019-12-09 ENCOUNTER — Other Ambulatory Visit: Payer: Self-pay

## 2019-12-09 DIAGNOSIS — M6281 Muscle weakness (generalized): Secondary | ICD-10-CM

## 2019-12-09 DIAGNOSIS — I890 Lymphedema, not elsewhere classified: Secondary | ICD-10-CM | POA: Insufficient documentation

## 2019-12-09 DIAGNOSIS — R2681 Unsteadiness on feet: Secondary | ICD-10-CM | POA: Diagnosis not present

## 2019-12-09 DIAGNOSIS — R2689 Other abnormalities of gait and mobility: Secondary | ICD-10-CM

## 2019-12-09 NOTE — Therapy (Signed)
[place], [place], [address] Phone: [phone]   Fax:  [phone]  Physical Therapy Treatment  Patient Details  Name: Jean Mcclain MRN: [id number] Date of Birth: Mar 17, 1946 Referring Provider (PT): Landis Martins   Encounter Date: 12/09/2019  PT End of Session - 12/09/19 1306    Visit Number  4    Number of Visits  12    Date for PT Re-Evaluation  01/20/20    Authorization Type  [employer]    Authorization - Visit Number  4    Authorization - Number of Visits  10    PT Start Time  1304    PT Stop Time  1400    PT Time Calculation (min)  56 min    Activity Tolerance  Patient tolerated treatment well    Behavior During Therapy  [facility] for tasks assessed/performed       Past Medical History:  Diagnosis Date  . Anxiety   . Arthritis    osteo  . Chronic kidney disease   . Diabetes mellitus, type 2 (HCC)    Type II  . DVT (deep venous thrombosis) ([place]) 03/2017   left popliteal  . Facet joint disease   . Facet joint disease    foot  . Fibula fracture 02/2017   left  . GERD (gastroesophageal reflux disease)   . Hyperlipidemia   . Hypertension   . OSA (obstructive sleep apnea)    does not use CPAP   . Osteopenia   . Shortness of breath    with exertion  . Venous stasis ulcers ([place])   . Vitamin D deficiency     Past Surgical History:  Procedure Laterality Date  . AV FISTULA PLACEMENT Right 08/20/2018   Procedure: ARTERIOVENOUS (AV) FISTULA CREATION BRACHIOCEPHALIC;  Surgeon: Angelia Mould, MD;  Location: [facility];  Service: Vascular;  Laterality: Right;  . BREAST SURGERY Right 03/1997   biospy  . CARPAL TUNNEL RELEASE Left 2016  . INSERTION OF MESH N/A 01/08/2013   Procedure: INSERTION OF MESH;  Surgeon: Earnstine Regal, MD;  Location: [facility];  Service: General;  Laterality: N/A;  . IR THORACENTESIS ASP PLEURAL SPACE W/IMG GUIDE  04/06/2017  . JOINT REPLACEMENT Right 1999   . TOTAL KNEE ARTHROPLASTY Left 1999   Left  . TRIGGER FINGER RELEASE Left 11/2014  . TRIGGER FINGER RELEASE Right   . VENTRAL HERNIA REPAIR N/A 01/08/2013   Procedure: LAPAROSCOPIC VENTRAL HERNIA;  Surgeon: Earnstine Regal, MD;  [facility];  Service: General;  Laterality: N/A;    There were no vitals filed for this visit.  Subjective Assessment - 12/09/19 1306    Subjective  Pt states that she had a painful area on the dorsum of the R ot and at the proximal aspect of the L LE .    Pertinent History  DM, gout, B lymphedema,    Limitations  Standing;Walking    How long can you sit comfortably?  no problem    How long can you stand comfortably?  only a few minutes    How long can you walk comfortably?  only a few steps    Patient Stated Goals  leg swelling to decrease to be more balanced    Currently in Pain?  Yes    Pain Score  8     Pain Location  Hip    Pain Orientation  Left    Pain Descriptors / Indicators  Aching;Constant  Pain Type  Chronic pain                       [facility] Adult PT Treatment/Exercise - 12/09/19 0001      Manual Therapy   Manual Therapy  Compression Bandaging    Manual therapy comments  pt Bil LE were washed prior to compression wrap donning and thick lotion was applied.     Compression Bandaging  compression bandaging knee high to both legs using 1 artiflex on the R and 2 on the L folding artiflex at the distal lateral aspect for improved shaping with TG soft as a base layer and 1 8 cm for roman sandal and ankle/sole/heel then 2 10 cm for spiral wrap up the leg to the knee bil.              PT Education - 12/09/19 1543    Education Details  Pt was assisted with ordering edema wear for the BLE size small    Person(s) Educated  Patient    Methods  Explanation    Comprehension  Verbalized understanding       PT Short Term Goals - 11/06/19 1700      PT SHORT TERM GOAL #1   Title  PT to understand the chronic and progressive  nature of Lymphedema.    Time  2    Period  Weeks    Status  New    Target Date  11/20/19      PT SHORT TERM GOAL #2   Title  PT to be completing her breathing and LE exercises at least one time a day to increase lymphatic circulation    Time  2    Period  Weeks      PT SHORT TERM GOAL #3   Title  Blister on RT LE to be healed.    Time  2    Period  Weeks    Status  New        PT Long Term Goals - 11/06/19 1701      PT LONG TERM GOAL #1   Title  PT to have lost 4cm of volume from LT LE< ; 2 from RT to allow donning of pants and shoes to be easier.    Time  4    Status  New    Target Date  12/04/19      PT LONG TERM GOAL #2   Title  Pt to have and be using a compression pump for maintenance phase of treatment.    Time  4    Period  Weeks    Status  New      PT LONG TERM GOAL #3   Title  PT to have and be able to don Juxtafit B for maintenance phase of treatment.    Time  4    Period  Weeks    Status  New      PT LONG TERM GOAL #4   Title  Due to decreased lymphatic load pt to state that she has been able to walk more and easier in her apartment using her walker.    Time  4    Period  Weeks    Status  New            Plan - 12/09/19 1305    Clinical Impression Statement  Pt presents to physical therapy with her compression wrapping on from her last session. She continues with improvement in the  L lower leg and maintains at the RLE. Continued to wrap with 3 short stretch bandages on Bil lower legs. No toe wraps were used due to no increased edema in the toes with no previous toe wraps due to skin integrity concerns. Pt was assisted with ordering edema wear for her lower legs this session. Pt will benefit from continued POC at this time.    Personal Factors and Comorbidities  Age;Finances;Fitness;Transportation;Time since onset of injury/illness/exacerbation;Past/Current Experience;Comorbidity 3+    Comorbidities  DM, HX of DVT, CHF, renal failure on dialysis.     Rehab Potential  Good    PT Frequency  2x / week    PT Duration  6 weeks    PT Treatment/Interventions  Manual lymph drainage;Patient/family education;Therapeutic exercise;Compression bandaging    PT Next Visit Plan  ask  about exercises, Continue with wrapping and edema control.    PT Home Exercise Plan  Diapharagmic breathing, LE exercises    Consulted and Agree with Plan of Care  Patient       Patient will benefit from skilled therapeutic intervention in order to improve the following deficits and impairments:  Decreased skin integrity, Decreased range of motion, Decreased strength, Difficulty walking, Increased edema  Visit Diagnosis: Lymphedema, not elsewhere classified  Muscle weakness (generalized)  Unsteadiness on feet  Other abnormalities of gait and mobility     Problem List Patient Active Problem List   Diagnosis Date Noted  . ESRD on hemodialysis ([place]) 11/20/2019  . Uremia of renal origin 11/20/2019  . Myoclonus, segmental 11/20/2019  . OSA (obstructive sleep apnea) 03/13/2019  . Chronic acquired lymphedema 11/05/2018  . Acute respiratory distress   . Acute on chronic diastolic CHF (congestive heart failure) ([place])   . AKI (acute kidney injury) ([place]) 03/30/2017  . Sepsis ([place]) 03/30/2017  . Acute deep vein thrombosis (DVT) of popliteal vein of left lower extremity ([place]) 03/11/2017  . Renal insufficiency 03/11/2017  . Insulin-requiring or dependent type II diabetes mellitus ([place]) 03/11/2017  . Essential hypertension 03/11/2017  . Impingement syndrome of left ankle 01/31/2017  . Posterior tibial tendinitis, left leg 01/31/2017  . Incarcerated ventral hernia 01/02/2013    Ander Purpura, PT 12/09/2019, 3:47 PM  [place], [place], [address] Phone: [phone]   Fax:  [phone]  Name: DEDRA MATSUO MRN: [id number] Date of Birth: [birth year]

## 2019-12-10 DIAGNOSIS — D689 Coagulation defect, unspecified: Secondary | ICD-10-CM | POA: Diagnosis not present

## 2019-12-10 DIAGNOSIS — Z992 Dependence on renal dialysis: Secondary | ICD-10-CM | POA: Diagnosis not present

## 2019-12-10 DIAGNOSIS — Z23 Encounter for immunization: Secondary | ICD-10-CM | POA: Diagnosis not present

## 2019-12-10 DIAGNOSIS — N186 End stage renal disease: Secondary | ICD-10-CM | POA: Diagnosis not present

## 2019-12-10 DIAGNOSIS — D509 Iron deficiency anemia, unspecified: Secondary | ICD-10-CM | POA: Diagnosis not present

## 2019-12-11 ENCOUNTER — Encounter (HOSPITAL_COMMUNITY): Payer: HMO | Admitting: Physical Therapy

## 2019-12-11 ENCOUNTER — Other Ambulatory Visit: Payer: Self-pay

## 2019-12-11 ENCOUNTER — Ambulatory Visit: Payer: HMO

## 2019-12-11 DIAGNOSIS — R2681 Unsteadiness on feet: Secondary | ICD-10-CM

## 2019-12-11 DIAGNOSIS — I890 Lymphedema, not elsewhere classified: Secondary | ICD-10-CM | POA: Diagnosis not present

## 2019-12-11 DIAGNOSIS — R2689 Other abnormalities of gait and mobility: Secondary | ICD-10-CM

## 2019-12-11 DIAGNOSIS — M6281 Muscle weakness (generalized): Secondary | ICD-10-CM

## 2019-12-11 NOTE — Therapy (Signed)
[place], [place], [address] Phone: [phone]   Fax:  [phone]  Physical Therapy Treatment  Patient Details  Name: Jean Mcclain MRN: [id number] Date of Birth: 1946/06/22 Referring Provider (PT): Landis Martins   Encounter Date: 12/11/2019  PT End of Session - 12/11/19 1729    Visit Number  5    Number of Visits  12    Date for PT Re-Evaluation  01/20/20    Authorization Type  [employer]    Authorization - Visit Number  5    Authorization - Number of Visits  10    PT Start Time  1740    PT Stop Time  1448    PT Time Calculation (min)  45 min    Activity Tolerance  Patient tolerated treatment well    Behavior During Therapy  [facility] for tasks assessed/performed       Past Medical History:  Diagnosis Date  . Anxiety   . Arthritis    osteo  . Chronic kidney disease   . Diabetes mellitus, type 2 (HCC)    Type II  . DVT (deep venous thrombosis) ([place]) 03/2017   left popliteal  . Facet joint disease   . Facet joint disease    foot  . Fibula fracture 02/2017   left  . GERD (gastroesophageal reflux disease)   . Hyperlipidemia   . Hypertension   . OSA (obstructive sleep apnea)    does not use CPAP   . Osteopenia   . Shortness of breath    with exertion  . Venous stasis ulcers ([place])   . Vitamin D deficiency     Past Surgical History:  Procedure Laterality Date  . AV FISTULA PLACEMENT Right 08/20/2018   Procedure: ARTERIOVENOUS (AV) FISTULA CREATION BRACHIOCEPHALIC;  Surgeon: Angelia Mould, MD;  Location: [facility];  Service: Vascular;  Laterality: Right;  . BREAST SURGERY Right 03/1997   biospy  . CARPAL TUNNEL RELEASE Left 2016  . INSERTION OF MESH N/A 01/08/2013   Procedure: INSERTION OF MESH;  Surgeon: Earnstine Regal, MD;  [facility];  Service: General;  Laterality: N/A;  . IR THORACENTESIS ASP PLEURAL SPACE W/IMG GUIDE  04/06/2017  . JOINT REPLACEMENT Right 1999   . TOTAL KNEE ARTHROPLASTY Left 1999   Left  . TRIGGER FINGER RELEASE Left 11/2014  . TRIGGER FINGER RELEASE Right   . VENTRAL HERNIA REPAIR N/A 01/08/2013   Procedure: LAPAROSCOPIC VENTRAL HERNIA;  Surgeon: Earnstine Regal, MD;  [facility];  Service: General;  Laterality: N/A;    There were no vitals filed for this visit.  Subjective Assessment - 12/11/19 1726    Subjective  Pt reports that her wraps did really well last time without any pain.    Pertinent History  DM, gout, B lymphedema,    Limitations  Standing;Walking    How long can you sit comfortably?  no problem    How long can you stand comfortably?  only a few minutes    How long can you walk comfortably?  only a few steps    Patient Stated Goals  leg swelling to decrease to be more balanced    Currently in Pain?  Yes    Pain Score  8     Pain Location  Hip    Pain Orientation  Left    Pain Descriptors / Indicators  Aching;Constant    Pain Type  Chronic pain  [facility] Adult PT Treatment/Exercise - 12/11/19 0001      Manual Therapy   Manual Therapy  Compression Bandaging    Manual therapy comments  pt Bil LE were washed prior to compression wrap donning and thick lotion was applied. Pt brought powder that was used at BIl toes due to pt feet tend to sweat in her shoes.     Compression Bandaging  compression bandaging knee high to both legs using 1 artiflex on the R and 2 on the L folding artiflex at the distal lateral aspect for improved shaping with TG soft as a base layer and 1 8 cm for roman sandal and ankle/sole/heel then 2 10 cm for spiral wrap up the leg to the knee bil. ABD pad used for extra padding at dorsum of the R foot and at the proximal anterior portion of the wrap to prevent itching.              PT Education - 12/11/19 1728    Education Details  Pt will continue wearing wraps and bring edema wear if she receives it.    Person(s) Educated  Patient    Methods   Explanation    Comprehension  Verbalized understanding       PT Short Term Goals - 11/06/19 1700      PT SHORT TERM GOAL #1   Title  PT to understand the chronic and progressive nature of Lymphedema.    Time  2    Period  Weeks    Status  New    Target Date  11/20/19      PT SHORT TERM GOAL #2   Title  PT to be completing her breathing and LE exercises at least one time a day to increase lymphatic circulation    Time  2    Period  Weeks      PT SHORT TERM GOAL #3   Title  Blister on RT LE to be healed.    Time  2    Period  Weeks    Status  New        PT Long Term Goals - 11/06/19 1701      PT LONG TERM GOAL #1   Title  PT to have lost 4cm of volume from LT LE< ; 2 from RT to allow donning of pants and shoes to be easier.    Time  4    Status  New    Target Date  12/04/19      PT LONG TERM GOAL #2   Title  Pt to have and be using a compression pump for maintenance phase of treatment.    Time  4    Period  Weeks    Status  New      PT LONG TERM GOAL #3   Title  PT to have and be able to don Juxtafit B for maintenance phase of treatment.    Time  4    Period  Weeks    Status  New      PT LONG TERM GOAL #4   Title  Due to decreased lymphatic load pt to state that she has been able to walk more and easier in her apartment using her walker.    Time  4    Period  Weeks    Status  New            Plan - 12/11/19 1730    Clinical Impression Statement  Pt presents to  physical therapy with her compression wrapping on from her last session. She continues with improvement in the L lower leg and maintains in the RLE. Continued to wrap with 3 short stretch bandages on Bil lower legs. No toe wraps were used due to no increased edema in the toes with no preoivus toe wraps due to skin integrity concerns. Extra padding was continued to be used at the dorsum of the R foot and L anteiror proximal wrap and added to the R anterior proximal wrap to prevent itching. Pt will  benefit form continued POC at this time.    Personal Factors and Comorbidities  Age;Finances;Fitness;Transportation;Time since onset of injury/illness/exacerbation;Past/Current Experience;Comorbidity 3+    Comorbidities  DM, HX of DVT, CHF, renal failure on dialysis.    Examination-Activity Limitations  Bathing;Bend;Carry;Dressing;Locomotion Level;Stairs;Stand    Examination-Participation Restrictions  Church;Cleaning;Community Activity;Driving;Laundry;Shop    Rehab Potential  Good    PT Frequency  2x / week    PT Duration  6 weeks    PT Treatment/Interventions  Manual lymph drainage;Patient/family education;Therapeutic exercise;Compression bandaging    PT Next Visit Plan  ask  about exercises, Continue with wrapping and edema control.    PT Home Exercise Plan  Diapharagmic breathing, LE exercises    Consulted and Agree with Plan of Care  Patient       Patient will benefit from skilled therapeutic intervention in order to improve the following deficits and impairments:  Decreased skin integrity, Decreased range of motion, Decreased strength, Difficulty walking, Increased edema  Visit Diagnosis: Lymphedema, not elsewhere classified  Muscle weakness (generalized)  Unsteadiness on feet  Other abnormalities of gait and mobility     Problem List Patient Active Problem List   Diagnosis Date Noted  . ESRD on hemodialysis ([place]) 11/20/2019  . Uremia of renal origin 11/20/2019  . Myoclonus, segmental 11/20/2019  . OSA (obstructive sleep apnea) 03/13/2019  . Chronic acquired lymphedema 11/05/2018  . Acute respiratory distress   . Acute on chronic diastolic CHF (congestive heart failure) ([place])   . AKI (acute kidney injury) ([place]) 03/30/2017  . Sepsis ([place]) 03/30/2017  . Acute deep vein thrombosis (DVT) of popliteal vein of left lower extremity ([place]) 03/11/2017  . Renal insufficiency 03/11/2017  . Insulin-requiring or dependent type II diabetes mellitus ([place]) 03/11/2017  . Essential  hypertension 03/11/2017  . Impingement syndrome of left ankle 01/31/2017  . Posterior tibial tendinitis, left leg 01/31/2017  . Incarcerated ventral hernia 01/02/2013    Ander Purpura, PT 12/11/2019, 5:31 PM  [place], [place], [address] Phone: [phone]   Fax:  [phone]  Name: ANNALINA NEEDLES MRN: [id number] Date of Birth: [birth year]

## 2019-12-12 DIAGNOSIS — N186 End stage renal disease: Secondary | ICD-10-CM | POA: Diagnosis not present

## 2019-12-12 DIAGNOSIS — D509 Iron deficiency anemia, unspecified: Secondary | ICD-10-CM | POA: Diagnosis not present

## 2019-12-12 DIAGNOSIS — D689 Coagulation defect, unspecified: Secondary | ICD-10-CM | POA: Diagnosis not present

## 2019-12-12 DIAGNOSIS — Z992 Dependence on renal dialysis: Secondary | ICD-10-CM | POA: Diagnosis not present

## 2019-12-13 ENCOUNTER — Encounter (HOSPITAL_COMMUNITY): Payer: HMO | Admitting: Physical Therapy

## 2019-12-16 ENCOUNTER — Encounter (HOSPITAL_COMMUNITY): Payer: HMO | Admitting: Physical Therapy

## 2019-12-16 DIAGNOSIS — E441 Mild protein-calorie malnutrition: Secondary | ICD-10-CM | POA: Insufficient documentation

## 2019-12-17 ENCOUNTER — Ambulatory Visit: Payer: HMO

## 2019-12-17 ENCOUNTER — Other Ambulatory Visit: Payer: Self-pay

## 2019-12-17 DIAGNOSIS — I890 Lymphedema, not elsewhere classified: Secondary | ICD-10-CM | POA: Diagnosis not present

## 2019-12-17 DIAGNOSIS — R2689 Other abnormalities of gait and mobility: Secondary | ICD-10-CM

## 2019-12-17 DIAGNOSIS — R2681 Unsteadiness on feet: Secondary | ICD-10-CM

## 2019-12-17 DIAGNOSIS — M6281 Muscle weakness (generalized): Secondary | ICD-10-CM

## 2019-12-17 NOTE — Therapy (Signed)
[place], [place], [address] Phone: [phone]   Fax:  [phone]  Physical Therapy Treatment  Patient Details  Name: Jean Mcclain MRN: [id number] Date of Birth: 1945-12-15 Referring Provider (PT): Landis Martins   Encounter Date: 12/17/2019  PT End of Session - 12/17/19 1352    Visit Number  6    Number of Visits  12    Date for PT Re-Evaluation  01/20/20    Authorization Type  [employer]    Authorization - Visit Number  6    Authorization - Number of Visits  10    PT Start Time  1350    PT Stop Time  1435    PT Time Calculation (min)  45 min    Activity Tolerance  Patient tolerated treatment well    Behavior During Therapy  [facility] for tasks assessed/performed       Past Medical History:  Diagnosis Date  . Anxiety   . Arthritis    osteo  . Chronic kidney disease   . Diabetes mellitus, type 2 (HCC)    Type II  . DVT (deep venous thrombosis) ([place]) 03/2017   left popliteal  . Facet joint disease   . Facet joint disease    foot  . Fibula fracture 02/2017   left  . GERD (gastroesophageal reflux disease)   . Hyperlipidemia   . Hypertension   . OSA (obstructive sleep apnea)    does not use CPAP   . Osteopenia   . Shortness of breath    with exertion  . Venous stasis ulcers ([place])   . Vitamin D deficiency     Past Surgical History:  Procedure Laterality Date  . AV FISTULA PLACEMENT Right 08/20/2018   Procedure: ARTERIOVENOUS (AV) FISTULA CREATION BRACHIOCEPHALIC;  Surgeon: Angelia Mould, MD;  Location: [place];  Service: Vascular;  Laterality: Right;  . BREAST SURGERY Right 03/1997   biospy  . CARPAL TUNNEL RELEASE Left 2016  . INSERTION OF MESH N/A 01/08/2013   Procedure: INSERTION OF MESH;  Surgeon: Earnstine Regal, MD;  [facility];  Service: General;  Laterality: N/A;  . IR THORACENTESIS ASP PLEURAL SPACE W/IMG GUIDE  04/06/2017  . JOINT REPLACEMENT Right 1999   . TOTAL KNEE ARTHROPLASTY Left 1999   Left  . TRIGGER FINGER RELEASE Left 11/2014  . TRIGGER FINGER RELEASE Right   . VENTRAL HERNIA REPAIR N/A 01/08/2013   Procedure: LAPAROSCOPIC VENTRAL HERNIA;  Surgeon: Earnstine Regal, MD;  [facility];  Service: General;  Laterality: N/A;    There were no vitals filed for this visit.  Subjective Assessment - 12/17/19 1352    Subjective  Pt reports that she has something on the top of her foot that is bothering her and doesnt feel good feels like needles in her foot.    Pertinent History  DM, gout, B lymphedema,    Limitations  Standing;Walking    How long can you sit comfortably?  no problem    How long can you stand comfortably?  only a few minutes    How long can you walk comfortably?  only a few steps    Patient Stated Goals  leg swelling to decrease to be more balanced    Currently in Pain?  Yes    Pain Score  4     Pain Location  Foot    Pain Orientation  Anterior;Left    Pain Descriptors / Indicators  Stabbing;Burning    Pain Type  Other (Comment)   pt states it feels like wrap is rubbing           LYMPHEDEMA/ONCOLOGY QUESTIONNAIRE - 12/17/19 1417      Right Lower Extremity Lymphedema   30 cm Proximal to Floor at Lateral Plantar Foot  38.4 cm    20 cm Proximal to Floor at Lateral Plantar Foot  33.2 1    10  cm Proximal to Floor at Lateral Malleoli  23.9 cm    Circumference of ankle/heel  24 cm.    5 cm Proximal to 1st MTP Joint  20.4 cm    Across MTP Joint  20.5 cm      Left Lower Extremity Lymphedema   30 cm Proximal to Floor at Lateral Plantar Foot  39 cm    20 cm Proximal to Floor at Lateral Plantar Foot  34.3 cm    10 cm Proximal to Floor at Lateral Malleoli  26.3 cm    Circumference of ankle/heel  27 cm.    5 cm Proximal to 1st MTP Joint  19.9 cm    Across MTP Joint  20.2 cm    Around Proximal Great Toe  9.3 cm                [facility] Adult PT Treatment/Exercise - 12/17/19 0001      Manual Therapy    Manual Therapy  Edema management    Manual therapy comments  pt Bil LE were washed prior to compression wrap donning and thick lotion was applied. Pt brought powder that was used at BIl toes due to pt feet tend to sweat in her shoes.     Edema Management  Pt was assisted with donning edemawear that she received in the mail, size small thigh high.              PT Education - 12/17/19 1446    Education Details  Pt will wear edema wear until her next appointment at that time it will be assessed if she needs further wrapping, Pt was educated on purpose of vasopneumatic pump and how this well help with her treatment plan including information on CDT especially if she has difficulty with mobility a pump will be important for her to have for her legs to prevent a re-occurance of swelling that results in wounds.    Person(s) Educated  Patient    Methods  Explanation    Comprehension  Verbalized understanding       PT Short Term Goals - 11/06/19 1700      PT SHORT TERM GOAL #1   Title  PT to understand the chronic and progressive nature of Lymphedema.    Time  2    Period  Weeks    Status  New    Target Date  11/20/19      PT SHORT TERM GOAL #2   Title  PT to be completing her breathing and LE exercises at least one time a day to increase lymphatic circulation    Time  2    Period  Weeks      PT SHORT TERM GOAL #3   Title  Blister on RT LE to be healed.    Time  2    Period  Weeks    Status  New        PT Long Term Goals - 11/06/19 1701      PT LONG TERM GOAL #1   Title  PT to have lost 4cm of volume from LT LE< ; 2 from RT to allow donning of pants and shoes to be easier.    Time  4    Status  New    Target Date  12/04/19      PT LONG TERM GOAL #2   Title  Pt to have and be using a compression pump for maintenance phase of treatment.    Time  4    Period  Weeks    Status  New      PT LONG TERM GOAL #3   Title  PT to have and be able to don Juxtafit B for maintenance  phase of treatment.    Time  4    Period  Weeks    Status  New      PT LONG TERM GOAL #4   Title  Due to decreased lymphatic load pt to state that she has been able to walk more and easier in her apartment using her walker.    Time  4    Period  Weeks    Status  New            Plan - 12/17/19 1352    Clinical Impression Statement  Pt presents to physical therapy with her compression wrapping on from her last session. She had significant improvement with circumferential measurements this session from her initial evaluation. She has received edema wear in the mail that she was assisted with donning this session after measurements and skin care. Pt will benefit from continued POC at this time.    Personal Factors and Comorbidities  Age;Finances;Fitness;Transportation;Time since onset of injury/illness/exacerbation;Past/Current Experience;Comorbidity 3+    Comorbidities  DM, HX of DVT, CHF, renal failure on dialysis.    Examination-Activity Limitations  Bathing;Bend;Carry;Dressing;Locomotion Level;Stairs;Stand    Examination-Participation Restrictions  Church;Cleaning;Community Activity;Driving;Laundry;Shop    PT Frequency  2x / week    PT Duration  6 weeks    PT Treatment/Interventions  Manual lymph drainage;Patient/family education;Therapeutic exercise;Compression bandaging    PT Next Visit Plan  ask  about exercises, Continue with wrapping and edema control.    PT Home Exercise Plan  Diapharagmic breathing, LE exercises    Consulted and Agree with Plan of Care  Patient       Patient will benefit from skilled therapeutic intervention in order to improve the following deficits and impairments:  Decreased skin integrity, Decreased range of motion, Decreased strength, Difficulty walking, Increased edema  Visit Diagnosis: Lymphedema, not elsewhere classified  Muscle weakness (generalized)  Unsteadiness on feet  Other abnormalities of gait and mobility     Problem  List Patient Active Problem List   Diagnosis Date Noted  . ESRD on hemodialysis ([place]) 11/20/2019  . Uremia of renal origin 11/20/2019  . Myoclonus, segmental 11/20/2019  . OSA (obstructive sleep apnea) 03/13/2019  . Chronic acquired lymphedema 11/05/2018  . Acute respiratory distress   . Acute on chronic diastolic CHF (congestive heart failure) ([place])   . AKI (acute kidney injury) ([place]) 03/30/2017  . Sepsis ([place]) 03/30/2017  . Acute deep vein thrombosis (DVT) of popliteal vein of left lower extremity ([place]) 03/11/2017  . Renal insufficiency 03/11/2017  . Insulin-requiring or dependent type II diabetes mellitus ([place]) 03/11/2017  . Essential hypertension 03/11/2017  . Impingement syndrome of left ankle 01/31/2017  . Posterior tibial tendinitis, left leg 01/31/2017  . Incarcerated ventral hernia 01/02/2013    Ander Purpura, PT 12/17/2019, 2:49 PM  [place]  [place], [place], [address] Phone: [phone]   Fax:  [phone]  Name: Jean Mcclain MRN: [id number] Date of Birth: 1946-05-22

## 2019-12-18 ENCOUNTER — Encounter (HOSPITAL_COMMUNITY): Payer: HMO | Admitting: Physical Therapy

## 2019-12-19 ENCOUNTER — Other Ambulatory Visit: Payer: Self-pay

## 2019-12-19 ENCOUNTER — Ambulatory Visit: Payer: HMO

## 2019-12-19 DIAGNOSIS — R2689 Other abnormalities of gait and mobility: Secondary | ICD-10-CM

## 2019-12-19 DIAGNOSIS — I890 Lymphedema, not elsewhere classified: Secondary | ICD-10-CM

## 2019-12-19 DIAGNOSIS — R2681 Unsteadiness on feet: Secondary | ICD-10-CM

## 2019-12-19 DIAGNOSIS — M6281 Muscle weakness (generalized): Secondary | ICD-10-CM

## 2019-12-19 NOTE — Therapy (Signed)
[place], [place], [address] Phone: [phone]   Fax:  [phone]  Physical Therapy Treatment  Patient Details  Name: Jean Mcclain MRN: [id number] Date of Birth: [birth year] Referring Provider (PT): Landis Martins   Encounter Date: 12/19/2019  PT End of Session - 12/19/19 1359    Visit Number  7    Number of Visits  12    Date for PT Re-Evaluation  01/20/20    Authorization Type  [employer]    Authorization - Visit Number  7    Authorization - Number of Visits  10    PT Start Time  6962    PT Stop Time  1422    PT Time Calculation (min)  24 min    Activity Tolerance  Patient tolerated treatment well    Behavior During Therapy  [facility] for tasks assessed/performed       Past Medical History:  Diagnosis Date  . Anxiety   . Arthritis    osteo  . Chronic kidney disease   . Diabetes mellitus, type 2 (HCC)    Type II  . DVT (deep venous thrombosis) ([place]) 03/2017   left popliteal  . Facet joint disease   . Facet joint disease    foot  . Fibula fracture 02/2017   left  . GERD (gastroesophageal reflux disease)   . Hyperlipidemia   . Hypertension   . OSA (obstructive sleep apnea)    does not use CPAP   . Osteopenia   . Shortness of breath    with exertion  . Venous stasis ulcers ([place])   . Vitamin D deficiency     Past Surgical History:  Procedure Laterality Date  . AV FISTULA PLACEMENT Right 08/20/2018   Procedure: ARTERIOVENOUS (AV) FISTULA CREATION BRACHIOCEPHALIC;  Surgeon: Angelia Mould, MD;  Location: [facility];  Service: Vascular;  Laterality: Right;  . BREAST SURGERY Right 03/1997   biospy  . CARPAL TUNNEL RELEASE Left 2016  . INSERTION OF MESH N/A 01/08/2013   Procedure: INSERTION OF MESH;  Surgeon: Earnstine Regal, MD;  [facility];  Service: General;  Laterality: N/A;  . IR THORACENTESIS ASP PLEURAL SPACE W/IMG GUIDE  04/06/2017  . JOINT REPLACEMENT Right  1999  . TOTAL KNEE ARTHROPLASTY Left 1999   Left  . TRIGGER FINGER RELEASE Left 11/2014  . TRIGGER FINGER RELEASE Right   . VENTRAL HERNIA REPAIR N/A 01/08/2013   Procedure: LAPAROSCOPIC VENTRAL HERNIA;  Surgeon: Earnstine Regal, MD;  [facility];  Service: General;  Laterality: N/A;    There were no vitals filed for this visit.  Subjective Assessment - 12/19/19 1359    Subjective  Pt states that she had some itching that was pretty significant. She was scratching her R lower leg and her garment slid down.    Pertinent History  DM, gout, B lymphedema,    Limitations  Standing;Walking    How long can you sit comfortably?  no problem    How long can you stand comfortably?  only a few minutes    Patient Stated Goals  leg swelling to decrease to be more balanced    Currently in Pain?  No/denies    Pain Score  0-No pain            LYMPHEDEMA/ONCOLOGY QUESTIONNAIRE - 12/19/19 1400      Right Lower Extremity Lymphedema   30 cm Proximal to Floor at Lateral Plantar Foot  40 cm  20 cm Proximal to Floor at Lateral Plantar Foot  38 1    10 cm Proximal to Floor at Lateral Malleoli  24.3 cm    Circumference of ankle/heel  24 cm.    5 cm Proximal to 1st MTP Joint  20.2 cm    Across MTP Joint  20.5 cm    Around Proximal Great Toe  6.6 cm      Left Lower Extremity Lymphedema   30 cm Proximal to Floor at Lateral Plantar Foot  39.5 cm    20 cm Proximal to Floor at Lateral Plantar Foot  41 cm    10 cm Proximal to Floor at Lateral Malleoli  29.5 cm    Circumference of ankle/heel  27.3 cm.    5 cm Proximal to 1st MTP Joint  20 cm    Across MTP Joint  20.2 cm    Around Proximal Great Toe  8.9 cm                [facility] Adult PT Treatment/Exercise - 12/19/19 0001      Manual Therapy   Manual Therapy  Edema management    Manual therapy comments  pt Bil LE were washed prior to compression wrap donning and thick lotion was applied. Pt brought powder that was used at BIl toes due to  pt feet tend to sweat in her shoes.     Edema Management  Pt was assisted with donning edema wear. It was folded on the RLE and pulled up to mid thigh on the LLE             PT Education - 12/19/19 1451    Education Details  Pt will continue to wear her edema wear on her LE until her next appointment. She will try to elevate her LLE as much as possible to see if this doesn't help with swelling in her LLE.    Person(s) Educated  Patient    Methods  Explanation    Comprehension  Verbalized understanding       PT Short Term Goals - 11/06/19 1700      PT SHORT TERM GOAL #1   Title  PT to understand the chronic and progressive nature of Lymphedema.    Time  2    Period  Weeks    Status  New    Target Date  11/20/19      PT SHORT TERM GOAL #2   Title  PT to be completing her breathing and LE exercises at least one time a day to increase lymphatic circulation    Time  2    Period  Weeks      PT SHORT TERM GOAL #3   Title  Blister on RT LE to be healed.    Time  2    Period  Weeks    Status  New        PT Long Term Goals - 11/06/19 1701      PT LONG TERM GOAL #1   Title  PT to have lost 4cm of volume from LT LE< ; 2 from RT to allow donning of pants and shoes to be easier.    Time  4    Status  New    Target Date  12/04/19      PT LONG TERM GOAL #2   Title  Pt to have and be using a compression pump for maintenance phase of treatment.    Time  4  Period  Weeks    Status  New      PT LONG TERM GOAL #3   Title  PT to have and be able to don Juxtafit B for maintenance phase of treatment.    Time  4    Period  Weeks    Status  New      PT LONG TERM GOAL #4   Title  Due to decreased lymphatic load pt to state that she has been able to walk more and easier in her apartment using her walker.    Time  4    Period  Weeks    Status  New            Plan - 12/19/19 1358    Clinical Impression Statement  Pt presents to physical therapy with her edema wear  bunched on the RLE near the ankle. She has significant indentations where her socks are at her lower leg. She had an increase in edema in Bil LE but continues with significant decrease in circumferential measurements since her initial evaluation. Her bil LE were washed and then she was assisted with donning edema wear this time folding it on the RLE to below the knee and on the LLE up to mid thigh. Her sock on the L leg was cut with pt permission in order to prevent constriction. Pt will benefit from continued POC at this time.    Personal Factors and Comorbidities  Age;Finances;Fitness;Transportation;Time since onset of injury/illness/exacerbation;Past/Current Experience;Comorbidity 3+    Comorbidities  DM, HX of DVT, CHF, renal failure on dialysis.    Examination-Activity Limitations  Bathing;Bend;Carry;Dressing;Locomotion Level;Stairs;Stand    Examination-Participation Restrictions  Church;Cleaning;Community Activity;Driving;Laundry;Shop    Rehab Potential  Good    PT Frequency  2x / week    PT Duration  6 weeks    PT Treatment/Interventions  Manual lymph drainage;Patient/family education;Therapeutic exercise;Compression bandaging    PT Next Visit Plan  ask about elevation, circumferential measurements decide if you want to do more wrapping and possible more compression or stick with edemawear    PT Home Exercise Plan  Diaphragmatic breathing, LE exercises    Consulted and Agree with Plan of Care  Patient       Patient will benefit from skilled therapeutic intervention in order to improve the following deficits and impairments:  Decreased skin integrity, Decreased range of motion, Decreased strength, Difficulty walking, Increased edema  Visit Diagnosis: Lymphedema, not elsewhere classified  Muscle weakness (generalized)  Unsteadiness on feet  Other abnormalities of gait and mobility     Problem List Patient Active Problem List   Diagnosis Date Noted  . ESRD on hemodialysis ([place])  11/20/2019  . Uremia of renal origin 11/20/2019  . Myoclonus, segmental 11/20/2019  . OSA (obstructive sleep apnea) 03/13/2019  . Chronic acquired lymphedema 11/05/2018  . Acute respiratory distress   . Acute on chronic diastolic CHF (congestive heart failure) ([place])   . AKI (acute kidney injury) ([place]) 03/30/2017  . Sepsis ([place]) 03/30/2017  . Acute deep vein thrombosis (DVT) of popliteal vein of left lower extremity ([place]) 03/11/2017  . Renal insufficiency 03/11/2017  . Insulin-requiring or dependent type II diabetes mellitus ([place]) 03/11/2017  . Essential hypertension 03/11/2017  . Impingement syndrome of left ankle 01/31/2017  . Posterior tibial tendinitis, left leg 01/31/2017  . Incarcerated ventral hernia 01/02/2013    Ander Purpura, PT 12/19/2019, 2:58 PM  [place], [place], [address] Phone: [phone]  Fax:  [phone]  Name: Jean Mcclain MRN: [id number] Date of Birth: 1945/11/16

## 2019-12-20 ENCOUNTER — Encounter (HOSPITAL_COMMUNITY): Payer: HMO | Admitting: Physical Therapy

## 2019-12-23 ENCOUNTER — Encounter (HOSPITAL_COMMUNITY): Payer: HMO | Admitting: Physical Therapy

## 2019-12-24 ENCOUNTER — Encounter: Payer: Self-pay | Admitting: Sports Medicine

## 2019-12-24 ENCOUNTER — Ambulatory Visit: Payer: HMO

## 2019-12-24 ENCOUNTER — Ambulatory Visit (INDEPENDENT_AMBULATORY_CARE_PROVIDER_SITE_OTHER): Payer: HMO | Admitting: Sports Medicine

## 2019-12-24 ENCOUNTER — Other Ambulatory Visit: Payer: Self-pay

## 2019-12-24 VITALS — Temp 97.7°F

## 2019-12-24 DIAGNOSIS — R2689 Other abnormalities of gait and mobility: Secondary | ICD-10-CM

## 2019-12-24 DIAGNOSIS — B351 Tinea unguium: Secondary | ICD-10-CM | POA: Diagnosis not present

## 2019-12-24 DIAGNOSIS — E0842 Diabetes mellitus due to underlying condition with diabetic polyneuropathy: Secondary | ICD-10-CM

## 2019-12-24 DIAGNOSIS — M79676 Pain in unspecified toe(s): Secondary | ICD-10-CM | POA: Diagnosis not present

## 2019-12-24 DIAGNOSIS — I890 Lymphedema, not elsewhere classified: Secondary | ICD-10-CM | POA: Diagnosis not present

## 2019-12-24 DIAGNOSIS — R2681 Unsteadiness on feet: Secondary | ICD-10-CM

## 2019-12-24 DIAGNOSIS — M6281 Muscle weakness (generalized): Secondary | ICD-10-CM

## 2019-12-24 NOTE — Progress Notes (Signed)
Patient ID: Jean Mcclain, female   DOB: July 03, 1946, 74 y.o.   MRN: [id number]   Subjective: Jean Mcclain is a 74 y.o. female patient with history of diabetes who returns to office today complaining of long, painful nails; unable to trim. Patient states that the glucose reading this morning was not recorded and reports that she is now going to PT and has a wheelchair.  Patient denies any other pedal complaints at this time.  Patient Active Problem List   Diagnosis Date Noted  . ESRD on hemodialysis ([place]) 11/20/2019  . Uremia of renal origin 11/20/2019  . Myoclonus, segmental 11/20/2019  . OSA (obstructive sleep apnea) 03/13/2019  . Chronic acquired lymphedema 11/05/2018  . Acute respiratory distress   . Acute on chronic diastolic CHF (congestive heart failure) ([place])   . AKI (acute kidney injury) ([place]) 03/30/2017  . Sepsis ([place]) 03/30/2017  . Acute deep vein thrombosis (DVT) of popliteal vein of left lower extremity ([place]) 03/11/2017  . Renal insufficiency 03/11/2017  . Insulin-requiring or dependent type II diabetes mellitus ([place]) 03/11/2017  . Essential hypertension 03/11/2017  . Impingement syndrome of left ankle 01/31/2017  . Posterior tibial tendinitis, left leg 01/31/2017  . Incarcerated ventral hernia 01/02/2013   Current Outpatient Medications on File Prior to Visit  Medication Sig Dispense Refill  . acetaminophen (TYLENOL) 500 MG tablet Take 1,000 mg by mouth 3 (three) times daily as needed for moderate pain or headache.    . ALPRAZolam (XANAX) 0.25 MG tablet Take before the beginning of HD treatment , 1 tab 30 tablet 1  . apixaban (ELIQUIS) 2.5 MG TABS tablet Take 2.5 mg by mouth 2 (two) times daily.    . B-D INS SYR ULTRAFINE 1CC/30G 30G X 1/2" 1 ML MISC     . calcium acetate (PHOSLO) 667 MG tablet Take 667 mg by mouth 3 (three) times daily.    . carbidopa-levodopa (SINEMET IR) 10-100 MG tablet     . colchicine 0.6 MG tablet Take 0.6 mg by mouth daily as needed (gout  flare).     . cyclobenzaprine (FLEXERIL) 10 MG tablet Take 10 mg by mouth at bedtime.     . febuxostat (ULORIC) 40 MG tablet Take 40 mg by mouth daily.    Marland Kitchen gabapentin (NEURONTIN) 300 MG capsule Take 300 mg by mouth at bedtime.    Marland Kitchen glucose blood test strip OneTouch Verio test strips    . hydrOXYzine (ATARAX/VISTARIL) 10 MG tablet Take 1 tablet (10 mg total) by mouth 2 (two) times daily as needed. 60 tablet 5  . insulin NPH Human (HUMULIN N,NOVOLIN N) 100 UNIT/ML injection Inject 50 Units into the skin 2 (two) times daily.     . insulin regular (NOVOLIN R,HUMULIN R) 100 units/mL injection Inject 10-30 Units into the skin 3 (three) times daily before meals.     . Lancets (ONETOUCH DELICA PLUS [id number]) MISC     . lidocaine (LIDODERM) 5 % Place 1 patch onto the skin daily. Remove & Discard patch within 12 hours or as directed by MD 30 patch 0  . lidocaine-prilocaine (EMLA) cream Apply 1 application topically See admin instructions. 1 hour before dialysis to port    . lovastatin (MEVACOR) 40 MG tablet Take 40 mg by mouth at bedtime.     . multivitamin (RENA-VIT) TABS tablet Take 1 tablet by mouth daily.    Marland Kitchen omeprazole (PRILOSEC) 20 MG capsule Take 20 mg by mouth daily.    Marland Kitchen rOPINIRole (REQUIP XL) 8  MG 24 hr tablet Take 1 tablet (8 mg total) by mouth at bedtime. 90 tablet 1  . triamcinolone cream (KENALOG) 0.1 % Apply 1 application topically 2 (two) times daily as needed (itching).     . Tuberculin PPD (TUBERSOL ID) Inject into the skin.     Current Facility-Administered Medications on File Prior to Visit  Medication Dose Route Frequency Provider Last Rate Last Admin  . betamethasone acetate-betamethasone sodium phosphate (CELESTONE) injection 3 mg  3 mg Intramuscular Once Edrick Kins, DPM       No Known Allergies  No results found for this or any previous visit (from the past 2160 hour(s)).  Objective: General: Patient is awake, alert, and oriented x 3  Integument: Nails are tender,  long, thickened and dystrophic with subungual debris, consistent with onychomycosis, 1-5 bilateral. Compression wrap removed on left that revealed preulcerative lesions with significant 2+ pitting edema.  Remaining integument unremarkable.  Vasculature:  Dorsalis Pedis pulse 0/4 bilateral. Posterior Tibial pulse  0/4 bilateral unna boots/wraps bilateral.  Neurology:Protective sensation diminished bilateral.   Musculoskeletal: Unchanged foot deformity. No tenderness with calf compression bilateral.  No acute signs of DVT.  Assessment and Plan: Problem List Items Addressed This Visit    None    Visit Diagnoses    Pain due to onychomycosis of toenail    -  Primary   Diabetes mellitus due to underlying condition with diabetic polyneuropathy, unspecified whether long term insulin use (HCC)       Relevant Medications   carbidopa-levodopa (SINEMET IR) 10-100 MG tablet   Lymphedema         -Examined patient. -Re-Discussed and educated patient on diabetic foot care, especially with  regards to the vascular, neurological and musculoskeletal systems.  -Mechanically debrided all nails 1-5 bilateral using sterile nail nipper and filed with dremel without incident  -Awaiting diabetic shoes  -Patient to return in 3 months for at risk foot care -Patient advised to call the office if any problems or questions arise in the meantime.  Landis Martins, DPM

## 2019-12-24 NOTE — Therapy (Signed)
[place], [place], [address] Phone: [phone]   Fax:  [phone]  Physical Therapy Treatment  Patient Details  Name: Jean Mcclain MRN: [id number] Date of Birth: 07-Apr-1946 Referring Provider (PT): Landis Martins   Encounter Date: 12/24/2019  PT End of Session - 12/24/19 1328    Visit Number  8    Number of Visits  12    Date for PT Re-Evaluation  01/20/20    Authorization Type  [employer]    Authorization - Visit Number  8    Authorization - Number of Visits  10    PT Start Time  4656    PT Stop Time  1322    PT Time Calculation (min)  27 min    Activity Tolerance  Patient tolerated treatment well    Behavior During Therapy  [facility] for tasks assessed/performed       Past Medical History:  Diagnosis Date  . Anxiety   . Arthritis    osteo  . Chronic kidney disease   . Diabetes mellitus, type 2 (HCC)    Type II  . DVT (deep venous thrombosis) ([place]) 03/2017   left popliteal  . Facet joint disease   . Facet joint disease    foot  . Fibula fracture 02/2017   left  . GERD (gastroesophageal reflux disease)   . Hyperlipidemia   . Hypertension   . OSA (obstructive sleep apnea)    does not use CPAP   . Osteopenia   . Shortness of breath    with exertion  . Venous stasis ulcers ([place])   . Vitamin D deficiency     Past Surgical History:  Procedure Laterality Date  . AV FISTULA PLACEMENT Right 08/20/2018   Procedure: ARTERIOVENOUS (AV) FISTULA CREATION BRACHIOCEPHALIC;  Surgeon: Angelia Mould, MD;  Location: [place];  Service: Vascular;  Laterality: Right;  . BREAST SURGERY Right 03/1997   biospy  . CARPAL TUNNEL RELEASE Left 2016  . INSERTION OF MESH N/A 01/08/2013   Procedure: INSERTION OF MESH;  Surgeon: Earnstine Regal, MD;  Location: [facility];  Service: General;  Laterality: N/A;  . IR THORACENTESIS ASP PLEURAL SPACE W/IMG GUIDE  04/06/2017  . JOINT REPLACEMENT Right  1999  . TOTAL KNEE ARTHROPLASTY Left 1999   Left  . TRIGGER FINGER RELEASE Left 11/2014  . TRIGGER FINGER RELEASE Right   . VENTRAL HERNIA REPAIR N/A 01/08/2013   Procedure: LAPAROSCOPIC VENTRAL HERNIA;  Surgeon: Earnstine Regal, MD;  Location: [facility];  Service: General;  Laterality: N/A;    There were no vitals filed for this visit.  Subjective Assessment - 12/24/19 1324    Subjective  Pt states that she missed dialysis on Monday and she noticed significant increasein swelling after that time.    Pertinent History  DM, gout, B lymphedema,    Limitations  Standing;Walking    How long can you sit comfortably?  no problem    How long can you stand comfortably?  only a few minutes    How long can you walk comfortably?  only a few steps    Patient Stated Goals  leg swelling to decrease to be more balanced    Currently in Pain?  No/denies    Pain Score  0-No pain                       [facility] Adult PT Treatment/Exercise - 12/24/19 0001  Manual Therapy   Manual Therapy  Edema management;Compression Bandaging    Manual therapy comments  pt Bil LE were washed prior to compression wrap donning and thick lotion was applied. Pt brought powder that was used at BIl toes due to pt feet tend to sweat in her shoes.     Edema Management  Pt was assisted with donning edema wear on the RLE which is folded    Compression Bandaging  compression bandaging knee high to the L lower leg. using 2 artiflex  folded with one folded 4x over the dorsum of the foot due to sensitive area. TG soft as base layer 1 8 cm for roman sandal and ankle/sole/heel then 3 10 cm for spiral wrap up the leg to the knee.              PT Education - 12/24/19 1327    Education Details  Discussed possible home health assistance and pt speaking with her PCP as well as giving this physical therapist permission to speak with her PCP about home health assistance due to pt does not remove her compression garments. She  stays in her wheel chair all the time including for sleeping. Discussed possibly purchasing a second edema wear for the L lower leg due to she has swelled significantly with edema wear in place after missing dialysis.    Person(s) Educated  Patient    Methods  Explanation    Comprehension  Verbalized understanding       PT Short Term Goals - 11/06/19 1700      PT SHORT TERM GOAL #1   Title  PT to understand the chronic and progressive nature of Lymphedema.    Time  2    Period  Weeks    Status  New    Target Date  11/20/19      PT SHORT TERM GOAL #2   Title  PT to be completing her breathing and LE exercises at least one time a day to increase lymphatic circulation    Time  2    Period  Weeks      PT SHORT TERM GOAL #3   Title  Blister on RT LE to be healed.    Time  2    Period  Weeks    Status  New        PT Long Term Goals - 11/06/19 1701      PT LONG TERM GOAL #1   Title  PT to have lost 4cm of volume from LT LE< ; 2 from RT to allow donning of pants and shoes to be easier.    Time  4    Status  New    Target Date  12/04/19      PT LONG TERM GOAL #2   Title  Pt to have and be using a compression pump for maintenance phase of treatment.    Time  4    Period  Weeks    Status  New      PT LONG TERM GOAL #3   Title  PT to have and be able to don Juxtafit B for maintenance phase of treatment.    Time  4    Period  Weeks    Status  New      PT LONG TERM GOAL #4   Title  Due to decreased lymphatic load pt to state that she has been able to walk more and easier in her apartment using her walker.  Time  4    Period  Weeks    Status  New            Plan - 12/24/19 1329    Clinical Impression Statement  Pt presents to physical therapy with significant increase in edema in her L lower leg this session. Her R lower leg has maintained well. Discussed ordering second pair of edema wear for the L lower leg in order to increase compression without increasing  difficulty of donning. Also, discussed pt discussing with her MD about possible home health help for ADL's. Edema wear was donned at the R lower leg and L lower leg was wrapped with compression wrap. Pt will benefit from continued POC at this time.    Personal Factors and Comorbidities  Age;Finances;Fitness;Transportation;Time since onset of injury/illness/exacerbation;Past/Current Experience;Comorbidity 3+    Comorbidities  DM, HX of DVT, CHF, renal failure on dialysis.    Examination-Activity Limitations  Bathing;Bend;Carry;Dressing;Locomotion Level;Stairs;Stand    Examination-Participation Restrictions  Church;Cleaning;Community Activity;Driving;Laundry;Shop    Stability/Clinical Decision Making  Evolving/Moderate complexity    Rehab Potential  Good    PT Frequency  2x / week    PT Duration  6 weeks    PT Treatment/Interventions  Manual lymph drainage;Patient/family education;Therapeutic exercise;Compression bandaging    PT Next Visit Plan  ask about elevation, circumferential measurements decide if you want to do more wrapping and possible more compression or stick with edemawear    PT Home Exercise Plan  Diaphragmatic breathing, LE exercises    Consulted and Agree with Plan of Care  Patient       Patient will benefit from skilled therapeutic intervention in order to improve the following deficits and impairments:  Decreased skin integrity, Decreased range of motion, Decreased strength, Difficulty walking, Increased edema  Visit Diagnosis: Lymphedema, not elsewhere classified  Muscle weakness (generalized)  Unsteadiness on feet  Other abnormalities of gait and mobility     Problem List Patient Active Problem List   Diagnosis Date Noted  . ESRD on hemodialysis ([place]) 11/20/2019  . Uremia of renal origin 11/20/2019  . Myoclonus, segmental 11/20/2019  . OSA (obstructive sleep apnea) 03/13/2019  . Chronic acquired lymphedema 11/05/2018  . Acute respiratory distress   . Acute on  chronic diastolic CHF (congestive heart failure) ([place])   . AKI (acute kidney injury) ([place]) 03/30/2017  . Sepsis ([place]) 03/30/2017  . Acute deep vein thrombosis (DVT) of popliteal vein of left lower extremity ([place]) 03/11/2017  . Renal insufficiency 03/11/2017  . Insulin-requiring or dependent type II diabetes mellitus ([place]) 03/11/2017  . Essential hypertension 03/11/2017  . Impingement syndrome of left ankle 01/31/2017  . Posterior tibial tendinitis, left leg 01/31/2017  . Incarcerated ventral hernia 01/02/2013    Ander Purpura, PT 12/24/2019, 1:33 PM  [place], [place], [address] Phone: [phone]   Fax:  [phone]  Name: Jean Mcclain MRN: [id number] Date of Birth: Mar 08, 1946

## 2019-12-25 ENCOUNTER — Encounter (HOSPITAL_COMMUNITY): Payer: HMO | Admitting: Physical Therapy

## 2019-12-26 ENCOUNTER — Ambulatory Visit: Payer: HMO

## 2019-12-26 DIAGNOSIS — M6281 Muscle weakness (generalized): Secondary | ICD-10-CM

## 2019-12-26 DIAGNOSIS — I890 Lymphedema, not elsewhere classified: Secondary | ICD-10-CM

## 2019-12-26 DIAGNOSIS — R2681 Unsteadiness on feet: Secondary | ICD-10-CM

## 2019-12-26 DIAGNOSIS — R2689 Other abnormalities of gait and mobility: Secondary | ICD-10-CM

## 2019-12-26 NOTE — Therapy (Signed)
[place], [place], [address] Phone: [phone]   Fax:  [phone]  Physical Therapy Treatment  Patient Details  Name: Jean Mcclain MRN: [id number] Date of Birth: April 08, 1946 Referring Provider (PT): Landis Martins   Encounter Date: 12/26/2019  PT End of Session - 12/26/19 1432    Visit Number  9    Number of Visits  12    Date for PT Re-Evaluation  01/20/20    Authorization Type  [employer]    Authorization - Visit Number  9    Authorization - Number of Visits  10    PT Start Time  7253    PT Stop Time  1420    PT Time Calculation (min)  23 min    Activity Tolerance  Patient tolerated treatment well    Behavior During Therapy  [facility] for tasks assessed/performed       Past Medical History:  Diagnosis Date  . Anxiety   . Arthritis    osteo  . Chronic kidney disease   . Diabetes mellitus, type 2 (HCC)    Type II  . DVT (deep venous thrombosis) ([place]) 03/2017   left popliteal  . Facet joint disease   . Facet joint disease    foot  . Fibula fracture 02/2017   left  . GERD (gastroesophageal reflux disease)   . Hyperlipidemia   . Hypertension   . OSA (obstructive sleep apnea)    does not use CPAP   . Osteopenia   . Shortness of breath    with exertion  . Venous stasis ulcers ([place])   . Vitamin D deficiency     Past Surgical History:  Procedure Laterality Date  . AV FISTULA PLACEMENT Right 08/20/2018   Procedure: ARTERIOVENOUS (AV) FISTULA CREATION BRACHIOCEPHALIC;  Surgeon: Angelia Mould, MD;  Location: [place];  Service: Vascular;  Laterality: Right;  . BREAST SURGERY Right 03/1997   biospy  . CARPAL TUNNEL RELEASE Left 2016  . INSERTION OF MESH N/A 01/08/2013   Procedure: INSERTION OF MESH;  Surgeon: Earnstine Regal, MD;  [facility];  Service: General;  Laterality: N/A;  . IR THORACENTESIS ASP PLEURAL SPACE W/IMG GUIDE  04/06/2017  . JOINT REPLACEMENT Right  1999  . TOTAL KNEE ARTHROPLASTY Left 1999   Left  . TRIGGER FINGER RELEASE Left 11/2014  . TRIGGER FINGER RELEASE Right   . VENTRAL HERNIA REPAIR N/A 01/08/2013   Procedure: LAPAROSCOPIC VENTRAL HERNIA;  Surgeon: Earnstine Regal, MD;  [facility];  Service: General;  Laterality: N/A;    There were no vitals filed for this visit.  Subjective Assessment - 12/26/19 1433    Subjective  Pt states that she was able to go to dialysis yesterday. She reports that she is feeling okay and had no difficulties with her wraps.    Pertinent History  DM, gout, B lymphedema,    Limitations  Standing;Walking    How long can you sit comfortably?  no problem    How long can you stand comfortably?  only a few minutes    How long can you walk comfortably?  only a few steps    Patient Stated Goals  leg swelling to decrease to be more balanced    Currently in Pain?  No/denies    Pain Score  0-No pain                       [facility]  Adult PT Treatment/Exercise - 12/26/19 0001      Manual Therapy   Manual Therapy  Edema management;Compression Bandaging    Manual therapy comments  pt Bil LE were washed prior to compression wrap donning and thick lotion was applied. Pt brought powder that was used at BIl toes due to pt feet tend to sweat in her shoes.     Edema Management  Pt was assisted with donning edema wear on the RLE which is folded    Compression Bandaging  compression bandaging knee high to the L lower leg. using 2 artiflex  folded with one folded 4x over the dorsum of the foot due to sensitive area. TG soft as base layer 1 8 cm for roman sandal and ankle/sole/heel then 3 10 cm for spiral wrap up the leg to the knee.              PT Education - 12/26/19 1438    Education Details  Discussed possible infection in the L lower leg as well as using double compression over the legs in order to help contain swelling. Pt MD was notified of infection and LVM for his nurse as well as told pt  to call her MD.    Terence Lux) Educated  Patient    Methods  Explanation    Comprehension  Verbalized understanding       PT Short Term Goals - 11/06/19 1700      PT SHORT TERM GOAL #1   Title  PT to understand the chronic and progressive nature of Lymphedema.    Time  2    Period  Weeks    Status  New    Target Date  11/20/19      PT SHORT TERM GOAL #2   Title  PT to be completing her breathing and LE exercises at least one time a day to increase lymphatic circulation    Time  2    Period  Weeks      PT SHORT TERM GOAL #3   Title  Blister on RT LE to be healed.    Time  2    Period  Weeks    Status  New        PT Long Term Goals - 11/06/19 1701      PT LONG TERM GOAL #1   Title  PT to have lost 4cm of volume from LT LE< ; 2 from RT to allow donning of pants and shoes to be easier.    Time  4    Status  New    Target Date  12/04/19      PT LONG TERM GOAL #2   Title  Pt to have and be using a compression pump for maintenance phase of treatment.    Time  4    Period  Weeks    Status  New      PT LONG TERM GOAL #3   Title  PT to have and be able to don Juxtafit B for maintenance phase of treatment.    Time  4    Period  Weeks    Status  New      PT LONG TERM GOAL #4   Title  Due to decreased lymphatic load pt to state that she has been able to walk more and easier in her apartment using her walker.    Time  4    Period  Weeks    Status  New  Plan - 12/26/19 1430    Clinical Impression Statement  Pt presents to physical therapy with shoes on tied the same way they were tied when she left her visit on Tuesday by this physical therapist. Visually improved edema in her L lower leg. She has increased redness with concerns for possible development of infection (VM was left for MD nurse and pt was told to contact her Md). R lower leg continues to maintain well with slight increase in swelling. Discussed doubling up on compression for BLE due to slight  increase in edema in the RLE. Pt was assisted with skin care/hygiene of the lower legs. Edema wear was donned on the R lower leg and the L lower leg was wrapped foot to knee. Pt will benefit from continued POC at thsi time.    Personal Factors and Comorbidities  Age;Finances;Fitness;Transportation;Time since onset of injury/illness/exacerbation;Past/Current Experience;Comorbidity 3+    Comorbidities  DM, HX of DVT, CHF, renal failure on dialysis.    Examination-Activity Limitations  Bathing;Bend;Carry;Dressing;Locomotion Level;Stairs;Stand    Examination-Participation Restrictions  Church;Cleaning;Community Activity;Driving;Laundry;Shop    Rehab Potential  Good    PT Frequency  2x / week    PT Duration  6 weeks    PT Treatment/Interventions  Manual lymph drainage;Patient/family education;Therapeutic exercise;Compression bandaging    PT Next Visit Plan  ask about elevation, circumferential measurements decide if you want to do more wrapping and possible more compression or stick with edemawear    PT Home Exercise Plan  Diaphragmatic breathing, LE exercises    Consulted and Agree with Plan of Care  Patient       Patient will benefit from skilled therapeutic intervention in order to improve the following deficits and impairments:  Decreased skin integrity, Decreased range of motion, Decreased strength, Difficulty walking, Increased edema  Visit Diagnosis: Lymphedema, not elsewhere classified  Muscle weakness (generalized)  Unsteadiness on feet  Other abnormalities of gait and mobility     Problem List Patient Active Problem List   Diagnosis Date Noted  . ESRD on hemodialysis ([place]) 11/20/2019  . Uremia of renal origin 11/20/2019  . Myoclonus, segmental 11/20/2019  . OSA (obstructive sleep apnea) 03/13/2019  . Chronic acquired lymphedema 11/05/2018  . Acute respiratory distress   . Acute on chronic diastolic CHF (congestive heart failure) ([place])   . AKI (acute kidney injury) ([place])  03/30/2017  . Sepsis ([place]) 03/30/2017  . Acute deep vein thrombosis (DVT) of popliteal vein of left lower extremity ([place]) 03/11/2017  . Renal insufficiency 03/11/2017  . Insulin-requiring or dependent type II diabetes mellitus ([place]) 03/11/2017  . Essential hypertension 03/11/2017  . Impingement syndrome of left ankle 01/31/2017  . Posterior tibial tendinitis, left leg 01/31/2017  . Incarcerated ventral hernia 01/02/2013    Ander Purpura, PT 12/26/2019, 2:45 PM  [place], [place], [address] Phone: [phone]   Fax:  [phone]  Name: ANISIA LEIJA MRN: [id number] Date of Birth: 1945/11/02

## 2019-12-27 ENCOUNTER — Encounter (HOSPITAL_COMMUNITY): Payer: HMO | Admitting: Physical Therapy

## 2019-12-30 ENCOUNTER — Encounter (HOSPITAL_COMMUNITY): Payer: HMO | Admitting: Physical Therapy

## 2019-12-31 ENCOUNTER — Ambulatory Visit: Payer: HMO

## 2020-01-01 ENCOUNTER — Encounter (HOSPITAL_COMMUNITY): Payer: HMO | Admitting: Physical Therapy

## 2020-01-02 ENCOUNTER — Ambulatory Visit: Payer: HMO

## 2020-01-02 ENCOUNTER — Other Ambulatory Visit: Payer: Self-pay

## 2020-01-02 DIAGNOSIS — M25511 Pain in right shoulder: Secondary | ICD-10-CM | POA: Diagnosis not present

## 2020-01-02 DIAGNOSIS — R2689 Other abnormalities of gait and mobility: Secondary | ICD-10-CM

## 2020-01-02 DIAGNOSIS — I890 Lymphedema, not elsewhere classified: Secondary | ICD-10-CM | POA: Diagnosis not present

## 2020-01-02 DIAGNOSIS — Z992 Dependence on renal dialysis: Secondary | ICD-10-CM | POA: Diagnosis not present

## 2020-01-02 DIAGNOSIS — M6281 Muscle weakness (generalized): Secondary | ICD-10-CM

## 2020-01-02 DIAGNOSIS — E1129 Type 2 diabetes mellitus with other diabetic kidney complication: Secondary | ICD-10-CM | POA: Diagnosis not present

## 2020-01-02 DIAGNOSIS — R2681 Unsteadiness on feet: Secondary | ICD-10-CM

## 2020-01-02 DIAGNOSIS — M1990 Unspecified osteoarthritis, unspecified site: Secondary | ICD-10-CM | POA: Diagnosis not present

## 2020-01-02 NOTE — Therapy (Signed)
[place], [place], [address] Phone: [phone]   Fax:  [phone]  Physical Therapy Treatment 10th Visit Note Progress Note Reporting Period 12/02/2019 to 01/02/2020  See note below for Objective Data and Assessment of Progress/Goals.       Patient Details  Name: Jean Mcclain MRN: [id number] Date of Birth: 29-Sep-1946 Referring Provider (PT): Landis Martins   Encounter Date: 01/02/2020  PT End of Session - 01/02/20 1430    Visit Number  10    Number of Visits  12    Date for PT Re-Evaluation  01/20/20    Authorization Type  [employer]    Authorization - Visit Number  10    Authorization - Number of Visits  10    PT Start Time  1400    PT Stop Time  1435    PT Time Calculation (min)  35 min    Activity Tolerance  Patient tolerated treatment well    Behavior During Therapy  [facility] for tasks assessed/performed       Past Medical History:  Diagnosis Date  . Anxiety   . Arthritis    osteo  . Chronic kidney disease   . Diabetes mellitus, type 2 (HCC)    Type II  . DVT (deep venous thrombosis) ([place]) 03/2017   left popliteal  . Facet joint disease   . Facet joint disease    foot  . Fibula fracture 02/2017   left  . GERD (gastroesophageal reflux disease)   . Hyperlipidemia   . Hypertension   . OSA (obstructive sleep apnea)    does not use CPAP   . Osteopenia   . Shortness of breath    with exertion  . Venous stasis ulcers ([place])   . Vitamin D deficiency     Past Surgical History:  Procedure Laterality Date  . AV FISTULA PLACEMENT Right 08/20/2018   Procedure: ARTERIOVENOUS (AV) FISTULA CREATION BRACHIOCEPHALIC;  Surgeon: Angelia Mould, MD;  Location: [facility];  Service: Vascular;  Laterality: Right;  . BREAST SURGERY Right 03/1997   biospy  . CARPAL TUNNEL RELEASE Left 2016  . INSERTION OF MESH N/A 01/08/2013   Procedure: INSERTION OF MESH;  Surgeon: Earnstine Regal,  MD;  Location: [facility];  Service: General;  Laterality: N/A;  . IR THORACENTESIS ASP PLEURAL SPACE W/IMG GUIDE  04/06/2017  . JOINT REPLACEMENT Right 1999  . TOTAL KNEE ARTHROPLASTY Left 1999   Left  . TRIGGER FINGER RELEASE Left 11/2014  . TRIGGER FINGER RELEASE Right   . VENTRAL HERNIA REPAIR N/A 01/08/2013   Procedure: LAPAROSCOPIC VENTRAL HERNIA;  Surgeon: Earnstine Regal, MD;  Location: [facility];  Service: General;  Laterality: N/A;    There were no vitals filed for this visit.  Subjective Assessment - 01/02/20 1451    Subjective  Pt states that her legs have been doing pretty good. She continues with the itching but that has been an on-going issue most likely from her kidneys'. She has been trying to get a seat cushion from Dr. Philip Aspen due to she has a sore on her bottom from her wheelchair which she is in constantly.    Pertinent History  DM, gout, B lymphedema,    Limitations  Standing;Walking    How long can you sit comfortably?  no problem    How long can you stand comfortably?  only a few minutes    How long can you walk comfortably?  only  a few steps    Patient Stated Goals  leg swelling to decrease to be more balanced    Currently in Pain?  No/denies    Pain Score  0-No pain                       [facility] Adult PT Treatment/Exercise - 01/02/20 0001      Manual Therapy   Manual Therapy  Edema management;Compression Bandaging    Manual therapy comments  pt Bil LE were washed prior to compression wrap donning and thick lotion was applied.     Edema Management  Pt was assisted with donning edema wear on the RLE which is folded. She was assited with purchasing 2 new edemawear to double up to increase compression due to one does not seem like sufficient amount of compression for the legs.     Compression Bandaging  compression bandaging knee high to the L lower leg. using 2 artiflex  folded with one folded 4x over the dorsum of the foot due to sensitive area. TG soft as  base layer 1 8 cm for roman sandal and ankle/sole/heel then 3 10 cm for spiral wrap up the leg to the knee.              PT Education - 01/02/20 1454    Education Details  Discussed home health due to pt has been coming to physical therapy week after week with her shoes still on tied the same way from her last visit and this is no way to manage edema at home. She is at high risk for skin break down and infection. Pt was assisted with purchasing edema wear    Person(s) Educated  Patient    Methods  Explanation    Comprehension  Verbalized understanding       PT Short Term Goals - 11/06/19 1700      PT SHORT TERM GOAL #1   Title  PT to understand the chronic and progressive nature of Lymphedema.    Time  2    Period  Weeks    Status  New    Target Date  11/20/19      PT SHORT TERM GOAL #2   Title  PT to be completing her breathing and LE exercises at least one time a day to increase lymphatic circulation    Time  2    Period  Weeks      PT SHORT TERM GOAL #3   Title  Blister on RT LE to be healed.    Time  2    Period  Weeks    Status  New        PT Long Term Goals - 11/06/19 1701      PT LONG TERM GOAL #1   Title  PT to have lost 4cm of volume from LT LE< ; 2 from RT to allow donning of pants and shoes to be easier.    Time  4    Status  New    Target Date  12/04/19      PT LONG TERM GOAL #2   Title  Pt to have and be using a compression pump for maintenance phase of treatment.    Time  4    Period  Weeks    Status  New      PT LONG TERM GOAL #3   Title  PT to have and be able to don Juxtafit B for maintenance phase  of treatment.    Time  4    Period  Weeks    Status  New      PT LONG TERM GOAL #4   Title  Due to decreased lymphatic load pt to state that she has been able to walk more and easier in her apartment using her walker.    Time  4    Period  Weeks    Status  New            Plan - 01/02/20 1430    Clinical Impression Statement  Pt  presents today with significant improvementin the L lower leg. She was busy and unable to make her appointment on Tuesday. Pt legs were not measured this session but has a visual improvement in size and coloration. Significant decrease in rubor at the L lower leg and decrease in edema. She continues with edema that is unable to be maintained with one layer of edemawear at this time. Pt reports that she did not receive a call from her MD but did go see her MD today but forgot to mention her leg. She was advised at this time that the swelling was the source of the discoloration and there is no longer a concern for infection due to no signs/symptoms at this time. Discussed in depth with patient that she needs home health to help her with her ADL's due to she returns week after week with the shoes tied the same way that the physical therapist tied them at her previous session. Pt is concerned that insurance will not cover home health and she does not have the funds. Discussed trying to get a referral so that we can at least check her benefits and possibly getting help from social work. Pt was assisted with purchasing edema wear this session for a second layer to help contain the fluid in her lower legs. Pt will benefit from continued physical therapy services 2x/week for 4 weeks to make sure she has adequate equipment and help at home to manage the swelling in her legs in order to decrease risk for hospitalization due to infection related to edema.    Personal Factors and Comorbidities  Age;Finances;Fitness;Transportation;Time since onset of injury/illness/exacerbation;Past/Current Experience;Comorbidity 3+    Comorbidities  DM, HX of DVT, CHF, renal failure on dialysis.    Examination-Activity Limitations  Bathing;Bend;Carry;Dressing;Locomotion Level;Stairs;Stand    Examination-Participation Restrictions  Church;Cleaning;Community Activity;Driving;Laundry;Shop    Rehab Potential  Good    PT Frequency  2x / week     PT Duration  6 weeks    PT Treatment/Interventions  Manual lymph drainage;Patient/family education;Therapeutic exercise;Compression bandaging    PT Next Visit Plan  ask about elevation, circumferential measurements decide if you want to do more wrapping and possible more compression or stick with edemawear    PT Home Exercise Plan  Diaphragmatic breathing, LE exercises    Consulted and Agree with Plan of Care  Patient       Patient will benefit from skilled therapeutic intervention in order to improve the following deficits and impairments:  Decreased skin integrity, Decreased range of motion, Decreased strength, Difficulty walking, Increased edema  Visit Diagnosis: Lymphedema, not elsewhere classified  Muscle weakness (generalized)  Unsteadiness on feet  Other abnormalities of gait and mobility     Problem List Patient Active Problem List   Diagnosis Date Noted  . ESRD on hemodialysis ([place]) 11/20/2019  . Uremia of renal origin 11/20/2019  . Myoclonus, segmental 11/20/2019  . OSA (obstructive sleep apnea)  03/13/2019  . Chronic acquired lymphedema 11/05/2018  . Acute respiratory distress   . Acute on chronic diastolic CHF (congestive heart failure) ([place])   . AKI (acute kidney injury) ([place]) 03/30/2017  . Sepsis ([place]) 03/30/2017  . Acute deep vein thrombosis (DVT) of popliteal vein of left lower extremity ([place]) 03/11/2017  . Renal insufficiency 03/11/2017  . Insulin-requiring or dependent type II diabetes mellitus ([place]) 03/11/2017  . Essential hypertension 03/11/2017  . Impingement syndrome of left ankle 01/31/2017  . Posterior tibial tendinitis, left leg 01/31/2017  . Incarcerated ventral hernia 01/02/2013    Ander Purpura, PT 01/02/2020, 3:01 PM  [place], [place], [address] Phone: [phone]   Fax:  [phone]  Name: Jean Mcclain MRN: [id number] Date of Birth: 1946-07-11

## 2020-01-03 ENCOUNTER — Encounter (HOSPITAL_COMMUNITY): Payer: HMO | Admitting: Physical Therapy

## 2020-01-05 DIAGNOSIS — J9602 Acute respiratory failure with hypercapnia: Secondary | ICD-10-CM | POA: Diagnosis not present

## 2020-01-05 DIAGNOSIS — I5033 Acute on chronic diastolic (congestive) heart failure: Secondary | ICD-10-CM | POA: Diagnosis not present

## 2020-01-06 ENCOUNTER — Encounter (HOSPITAL_COMMUNITY): Payer: HMO | Admitting: Physical Therapy

## 2020-01-07 ENCOUNTER — Other Ambulatory Visit: Payer: Self-pay

## 2020-01-07 ENCOUNTER — Ambulatory Visit: Payer: HMO

## 2020-01-07 DIAGNOSIS — I890 Lymphedema, not elsewhere classified: Secondary | ICD-10-CM

## 2020-01-07 DIAGNOSIS — R2681 Unsteadiness on feet: Secondary | ICD-10-CM

## 2020-01-07 DIAGNOSIS — M6281 Muscle weakness (generalized): Secondary | ICD-10-CM

## 2020-01-07 DIAGNOSIS — R2689 Other abnormalities of gait and mobility: Secondary | ICD-10-CM

## 2020-01-07 NOTE — Therapy (Signed)
[place], [place], [address] Phone: [phone]   Fax:  [phone]  Physical Therapy Treatment  Patient Details  Name: Jean Mcclain MRN: [id number] Date of Birth: [birth year] Referring Provider (PT): Landis Martins   Encounter Date: 01/07/2020  PT End of Session - 01/07/20 1359    Visit Number  11    Number of Visits  12    Date for PT Re-Evaluation  02/06/20    Authorization Type  [employer]    Authorization - Visit Number  11    Authorization - Number of Visits  10    PT Start Time  1400    PT Stop Time  1435    PT Time Calculation (min)  35 min    Activity Tolerance  Patient tolerated treatment well    Behavior During Therapy  [facility] for tasks assessed/performed       Past Medical History:  Diagnosis Date  . Anxiety   . Arthritis    osteo  . Chronic kidney disease   . Diabetes mellitus, type 2 (HCC)    Type II  . DVT (deep venous thrombosis) ([place]) 03/2017   left popliteal  . Facet joint disease   . Facet joint disease    foot  . Fibula fracture 02/2017   left  . GERD (gastroesophageal reflux disease)   . Hyperlipidemia   . Hypertension   . OSA (obstructive sleep apnea)    does not use CPAP   . Osteopenia   . Shortness of breath    with exertion  . Venous stasis ulcers ([place])   . Vitamin D deficiency     Past Surgical History:  Procedure Laterality Date  . AV FISTULA PLACEMENT Right 08/20/2018   Procedure: ARTERIOVENOUS (AV) FISTULA CREATION BRACHIOCEPHALIC;  Surgeon: Angelia Mould, MD;  Location: [facility];  Service: Vascular;  Laterality: Right;  . BREAST SURGERY Right 03/1997   biospy  . CARPAL TUNNEL RELEASE Left 2016  . INSERTION OF MESH N/A 01/08/2013   Procedure: INSERTION OF MESH;  Surgeon: Earnstine Regal, MD;  [facility];  Service: General;  Laterality: N/A;  . IR THORACENTESIS ASP PLEURAL SPACE W/IMG GUIDE  04/06/2017  . JOINT REPLACEMENT Right  1999  . TOTAL KNEE ARTHROPLASTY Left 1999   Left  . TRIGGER FINGER RELEASE Left 11/2014  . TRIGGER FINGER RELEASE Right   . VENTRAL HERNIA REPAIR N/A 01/08/2013   Procedure: LAPAROSCOPIC VENTRAL HERNIA;  Surgeon: Earnstine Regal, MD;  [facility];  Service: General;  Laterality: N/A;    There were no vitals filed for this visit.  Subjective Assessment - 01/07/20 1400    Subjective  Pt states that she started oxycodeine and prednisone but is unsure of the dose.    Pertinent History  DM, gout, B lymphedema,    Limitations  Standing;Walking    How long can you sit comfortably?  no problem    How long can you stand comfortably?  only a few minutes    How long can you walk comfortably?  only a few steps    Patient Stated Goals  leg swelling to decrease to be more balanced    Currently in Pain?  Yes    Pain Score  8                        [facility] Adult PT Treatment/Exercise - 01/07/20 0001  Manual Therapy   Manual Therapy  Edema management    Manual therapy comments  Pt bil LE were washed and thick lotion was applied prior to assisting her with donning edema wear. Powder was applied at Bil toes to assist with fungus growth prevention    Edema Management  Pt was assisted with donning edema wear on BIl LE doubling up with new edema wear ordered in order to manage swelling.              PT Education - 01/07/20 1446    Education Details  Discussed home health with patient and discharge plan. She will return at her next visit to be measured and then long term options need to be identified due to pt is unable to remove any type of compressoin device herself and needs assistance at home or she is at significant risk for skin break down. This was discussed with patient in depth.    Person(s) Educated  Patient    Methods  Explanation    Comprehension  Verbalized understanding       PT Short Term Goals - 11/06/19 1700      PT SHORT TERM GOAL #1   Title  PT to  understand the chronic and progressive nature of Lymphedema.    Time  2    Period  Weeks    Status  New    Target Date  11/20/19      PT SHORT TERM GOAL #2   Title  PT to be completing her breathing and LE exercises at least one time a day to increase lymphatic circulation    Time  2    Period  Weeks      PT SHORT TERM GOAL #3   Title  Blister on RT LE to be healed.    Time  2    Period  Weeks    Status  New        PT Long Term Goals - 11/06/19 1701      PT LONG TERM GOAL #1   Title  PT to have lost 4cm of volume from LT LE< ; 2 from RT to allow donning of pants and shoes to be easier.    Time  4    Status  New    Target Date  12/04/19      PT LONG TERM GOAL #2   Title  Pt to have and be using a compression pump for maintenance phase of treatment.    Time  4    Period  Weeks    Status  New      PT LONG TERM GOAL #3   Title  PT to have and be able to don Juxtafit B for maintenance phase of treatment.    Time  4    Period  Weeks    Status  New      PT LONG TERM GOAL #4   Title  Due to decreased lymphatic load pt to state that she has been able to walk more and easier in her apartment using her walker.    Time  4    Period  Weeks    Status  New            Plan - 01/07/20 1358    Clinical Impression Statement  Pt presents today with continued improvement in the L lower leg. Very little rubor was noted on the Bil lower extremities. She has very little desquamation but continues with dry skin  and creping due to decrease in fluid. Reintroduced topic of home health for pt to be able to discharge safely from physical therapy. Discussed measuring next session and then figuring out how she will be able to manage edema at home. She continues with her concerns that insurance will not cover home health and was at the MD today but forgot to mention help for home. Edema wear was donned after skin care to BIl LE, thihg on the L though it is edematous was not covered with edema  wear this session due to significant reduction with wrapping and last time edema wear was put up to the thigh it scooted down and caused constriction regressing progress rather than helping. Pt will benefit from continued POC at this time.    Personal Factors and Comorbidities  Age;Finances;Fitness;Transportation;Time since onset of injury/illness/exacerbation;Past/Current Experience;Comorbidity 3+    Comorbidities  DM, HX of DVT, CHF, renal failure on dialysis.    Examination-Activity Limitations  Bathing;Bend;Carry;Dressing;Locomotion Level;Stairs;Stand    Examination-Participation Restrictions  Church;Cleaning;Community Activity;Driving;Laundry;Shop    Rehab Potential  Good    PT Frequency  2x / week    PT Duration  6 weeks    PT Treatment/Interventions  Manual lymph drainage;Patient/family education;Therapeutic exercise;Compression bandaging    PT Next Visit Plan  ask about elevation, circumferential measurements decide if you want to do more wrapping and possible more compression or stick with edemawear    PT Home Exercise Plan  Diaphragmatic breathing, LE exercises    Consulted and Agree with Plan of Care  Patient       Patient will benefit from skilled therapeutic intervention in order to improve the following deficits and impairments:  Decreased skin integrity, Decreased range of motion, Decreased strength, Difficulty walking, Increased edema  Visit Diagnosis: Lymphedema, not elsewhere classified  Muscle weakness (generalized)  Unsteadiness on feet  Other abnormalities of gait and mobility     Problem List Patient Active Problem List   Diagnosis Date Noted  . ESRD on hemodialysis ([place]) 11/20/2019  . Uremia of renal origin 11/20/2019  . Myoclonus, segmental 11/20/2019  . OSA (obstructive sleep apnea) 03/13/2019  . Chronic acquired lymphedema 11/05/2018  . Acute respiratory distress   . Acute on chronic diastolic CHF (congestive heart failure) ([place])   . AKI (acute kidney  injury) ([place]) 03/30/2017  . Sepsis ([place]) 03/30/2017  . Acute deep vein thrombosis (DVT) of popliteal vein of left lower extremity ([place]) 03/11/2017  . Renal insufficiency 03/11/2017  . Insulin-requiring or dependent type II diabetes mellitus ([place]) 03/11/2017  . Essential hypertension 03/11/2017  . Impingement syndrome of left ankle 01/31/2017  . Posterior tibial tendinitis, left leg 01/31/2017  . Incarcerated ventral hernia 01/02/2013    Ander Purpura, PT 01/07/2020, 2:51 PM  [place], [place], [address] Phone: [phone]   Fax:  [phone]  Name: Jean Mcclain MRN: [id number] Date of Birth: 1946/04/05

## 2020-01-08 ENCOUNTER — Encounter (HOSPITAL_COMMUNITY): Payer: HMO | Admitting: Physical Therapy

## 2020-01-08 DIAGNOSIS — N186 End stage renal disease: Secondary | ICD-10-CM | POA: Diagnosis not present

## 2020-01-08 DIAGNOSIS — E1022 Type 1 diabetes mellitus with diabetic chronic kidney disease: Secondary | ICD-10-CM | POA: Diagnosis not present

## 2020-01-08 DIAGNOSIS — Z992 Dependence on renal dialysis: Secondary | ICD-10-CM | POA: Diagnosis not present

## 2020-01-09 ENCOUNTER — Ambulatory Visit: Payer: HMO | Attending: Internal Medicine

## 2020-01-09 ENCOUNTER — Other Ambulatory Visit: Payer: Self-pay

## 2020-01-09 DIAGNOSIS — R2689 Other abnormalities of gait and mobility: Secondary | ICD-10-CM | POA: Insufficient documentation

## 2020-01-09 DIAGNOSIS — R2681 Unsteadiness on feet: Secondary | ICD-10-CM | POA: Diagnosis not present

## 2020-01-09 DIAGNOSIS — I890 Lymphedema, not elsewhere classified: Secondary | ICD-10-CM | POA: Insufficient documentation

## 2020-01-09 DIAGNOSIS — M6281 Muscle weakness (generalized): Secondary | ICD-10-CM | POA: Diagnosis not present

## 2020-01-09 NOTE — Therapy (Signed)
[place], [place], [address] Phone: [phone]   Fax:  [phone]  Physical Therapy Treatment  Patient Details  Name: Jean Mcclain MRN: [id number] Date of Birth: [birth year] Referring Provider (PT): Landis Martins   Encounter Date: 01/09/2020  PT End of Session - 01/09/20 1513    Visit Number  12    Number of Visits  12    Date for PT Re-Evaluation  02/06/20    Authorization Type  [employer]    PT Start Time  1357    PT Stop Time  1420    PT Time Calculation (min)  23 min    Activity Tolerance  Patient tolerated treatment well    Behavior During Therapy  [facility] for tasks assessed/performed       Past Medical History:  Diagnosis Date  . Anxiety   . Arthritis    osteo  . Chronic kidney disease   . Diabetes mellitus, type 2 (HCC)    Type II  . DVT (deep venous thrombosis) ([place]) 03/2017   left popliteal  . Facet joint disease   . Facet joint disease    foot  . Fibula fracture 02/2017   left  . GERD (gastroesophageal reflux disease)   . Hyperlipidemia   . Hypertension   . OSA (obstructive sleep apnea)    does not use CPAP   . Osteopenia   . Shortness of breath    with exertion  . Venous stasis ulcers ([place])   . Vitamin D deficiency     Past Surgical History:  Procedure Laterality Date  . AV FISTULA PLACEMENT Right 08/20/2018   Procedure: ARTERIOVENOUS (AV) FISTULA CREATION BRACHIOCEPHALIC;  Surgeon: Angelia Mould, MD;  Location: [facility];  Service: Vascular;  Laterality: Right;  . BREAST SURGERY Right 03/1997   biospy  . CARPAL TUNNEL RELEASE Left 2016  . INSERTION OF MESH N/A 01/08/2013   Procedure: INSERTION OF MESH;  Surgeon: Earnstine Regal, MD;  [facility];  Service: General;  Laterality: N/A;  . IR THORACENTESIS ASP PLEURAL SPACE W/IMG GUIDE  04/06/2017  . JOINT REPLACEMENT Right 1999  . TOTAL KNEE ARTHROPLASTY Left 1999   Left  . TRIGGER FINGER RELEASE  Left 11/2014  . TRIGGER FINGER RELEASE Right   . VENTRAL HERNIA REPAIR N/A 01/08/2013   Procedure: LAPAROSCOPIC VENTRAL HERNIA;  Surgeon: Earnstine Regal, MD;  [facility];  Service: General;  Laterality: N/A;    There were no vitals filed for this visit.  Subjective Assessment - 01/09/20 1511    Subjective  Pt states that the oxycodeine seems to be helping her ankle.    Pertinent History  DM, gout, B lymphedema,    Limitations  Standing;Walking    How long can you sit comfortably?  no problem    How long can you stand comfortably?  only a few minutes    How long can you walk comfortably?  only a few steps    Patient Stated Goals  leg swelling to decrease to be more balanced    Currently in Pain?  Yes    Pain Score  8     Pain Location  Buttocks    Pain Orientation  Right;Left    Pain Descriptors / Indicators  Aching                       [facility] Adult PT Treatment/Exercise - 01/09/20 0001  Manual Therapy   Manual Therapy  Edema management    Manual therapy comments  Pt bil LE were washed and thick lotion was applied prior to assisting her with donning edema wear. Powder was applied at Bil toes to assist with fungus growth prevention    Edema Management  Pt was assisted with donning edema wear on BIl LE doubling up with new edema wear ordered in order to manage swelling.              PT Education - 01/09/20 1512    Education Details  Pt is going to call the social services who helped her previously getting a chair and see if she can get assistance 2 days a week with compression. This phyisical therapist is checking with social work at [facility] to see if pt qualifies for assistance.    Person(s) Educated  Patient    Methods  Explanation    Comprehension  Verbalized understanding       PT Short Term Goals - 11/06/19 1700      PT SHORT TERM GOAL #1   Title  PT to understand the chronic and progressive nature of Lymphedema.    Time  2    Period   Weeks    Status  New    Target Date  11/20/19      PT SHORT TERM GOAL #2   Title  PT to be completing her breathing and LE exercises at least one time a day to increase lymphatic circulation    Time  2    Period  Weeks      PT SHORT TERM GOAL #3   Title  Blister on RT LE to be healed.    Time  2    Period  Weeks    Status  New        PT Long Term Goals - 11/06/19 1701      PT LONG TERM GOAL #1   Title  PT to have lost 4cm of volume from LT LE< ; 2 from RT to allow donning of pants and shoes to be easier.    Time  4    Status  New    Target Date  12/04/19      PT LONG TERM GOAL #2   Title  Pt to have and be using a compression pump for maintenance phase of treatment.    Time  4    Period  Weeks    Status  New      PT LONG TERM GOAL #3   Title  PT to have and be able to don Juxtafit B for maintenance phase of treatment.    Time  4    Period  Weeks    Status  New      PT LONG TERM GOAL #4   Title  Due to decreased lymphatic load pt to state that she has been able to walk more and easier in her apartment using her walker.    Time  4    Period  Weeks    Status  New            Plan - 01/09/20 1513    Clinical Impression Statement  Pt presents today with continued improvement in the L lower leg with the edema wear. It is maintaining fluid great. She continues with very thin desquamation that is sloughing off well without loss of skin or open areas. Discussed in depth with pt the need for assistance  at home before complete discharge in order to decrease risk for skin integrity issues. Pt will benefit from continued POC at this time.    Personal Factors and Comorbidities  Age;Finances;Fitness;Transportation;Time since onset of injury/illness/exacerbation;Past/Current Experience;Comorbidity 3+    Comorbidities  DM, HX of DVT, CHF, renal failure on dialysis.    Examination-Activity Limitations  Bathing;Bend;Carry;Dressing;Locomotion Level;Stairs;Stand     Examination-Participation Restrictions  Church;Cleaning;Community Activity;Driving;Laundry;Shop    Rehab Potential  Good    PT Frequency  2x / week    PT Duration  6 weeks    PT Treatment/Interventions  Manual lymph drainage;Patient/family education;Therapeutic exercise;Compression bandaging    PT Next Visit Plan  ask about elevation, circumferential measurements discuss options and social services.    PT Home Exercise Plan  Diaphragmatic breathing, LE exercises    Consulted and Agree with Plan of Care  Patient       Patient will benefit from skilled therapeutic intervention in order to improve the following deficits and impairments:  Decreased skin integrity, Decreased range of motion, Decreased strength, Difficulty walking, Increased edema  Visit Diagnosis: Lymphedema, not elsewhere classified  Muscle weakness (generalized)  Unsteadiness on feet  Other abnormalities of gait and mobility     Problem List Patient Active Problem List   Diagnosis Date Noted  . ESRD on hemodialysis ([place]) 11/20/2019  . Uremia of renal origin 11/20/2019  . Myoclonus, segmental 11/20/2019  . OSA (obstructive sleep apnea) 03/13/2019  . Chronic acquired lymphedema 11/05/2018  . Acute respiratory distress   . Acute on chronic diastolic CHF (congestive heart failure) ([place])   . AKI (acute kidney injury) ([place]) 03/30/2017  . Sepsis ([place]) 03/30/2017  . Acute deep vein thrombosis (DVT) of popliteal vein of left lower extremity ([place]) 03/11/2017  . Renal insufficiency 03/11/2017  . Insulin-requiring or dependent type II diabetes mellitus ([place]) 03/11/2017  . Essential hypertension 03/11/2017  . Impingement syndrome of left ankle 01/31/2017  . Posterior tibial tendinitis, left leg 01/31/2017  . Incarcerated ventral hernia 01/02/2013    Ander Purpura, PT 01/09/2020, 3:19 PM  [place], [place], [address] Phone: [phone]    Fax:  [phone]  Name: Jean Mcclain MRN: [id number] Date of Birth: Jun 30, 1946

## 2020-01-10 ENCOUNTER — Encounter (HOSPITAL_COMMUNITY): Payer: HMO | Admitting: Physical Therapy

## 2020-01-10 DIAGNOSIS — N186 End stage renal disease: Secondary | ICD-10-CM | POA: Diagnosis not present

## 2020-01-10 DIAGNOSIS — D689 Coagulation defect, unspecified: Secondary | ICD-10-CM | POA: Diagnosis not present

## 2020-01-10 DIAGNOSIS — Z992 Dependence on renal dialysis: Secondary | ICD-10-CM | POA: Diagnosis not present

## 2020-01-10 DIAGNOSIS — D509 Iron deficiency anemia, unspecified: Secondary | ICD-10-CM | POA: Diagnosis not present

## 2020-01-10 DIAGNOSIS — E1122 Type 2 diabetes mellitus with diabetic chronic kidney disease: Secondary | ICD-10-CM | POA: Diagnosis not present

## 2020-01-21 DIAGNOSIS — M25511 Pain in right shoulder: Secondary | ICD-10-CM | POA: Diagnosis not present

## 2020-01-21 DIAGNOSIS — L989 Disorder of the skin and subcutaneous tissue, unspecified: Secondary | ICD-10-CM | POA: Diagnosis not present

## 2020-01-21 DIAGNOSIS — M6281 Muscle weakness (generalized): Secondary | ICD-10-CM | POA: Diagnosis not present

## 2020-01-21 DIAGNOSIS — M1990 Unspecified osteoarthritis, unspecified site: Secondary | ICD-10-CM | POA: Diagnosis not present

## 2020-01-21 DIAGNOSIS — Z789 Other specified health status: Secondary | ICD-10-CM | POA: Diagnosis not present

## 2020-01-21 DIAGNOSIS — Z992 Dependence on renal dialysis: Secondary | ICD-10-CM | POA: Diagnosis not present

## 2020-01-22 ENCOUNTER — Encounter (HOSPITAL_COMMUNITY): Payer: Self-pay

## 2020-01-22 ENCOUNTER — Other Ambulatory Visit: Payer: Self-pay

## 2020-01-22 ENCOUNTER — Emergency Department (HOSPITAL_COMMUNITY): Payer: HMO

## 2020-01-22 ENCOUNTER — Inpatient Hospital Stay (HOSPITAL_COMMUNITY)
Admission: EM | Admit: 2020-01-22 | Discharge: 2020-01-25 | DRG: 562 | Disposition: A | Discharge status: Skilled Nursing Facility | Payer: HMO | Attending: Internal Medicine | Admitting: Internal Medicine

## 2020-01-22 DIAGNOSIS — M25712 Osteophyte, left shoulder: Secondary | ICD-10-CM | POA: Diagnosis present

## 2020-01-22 DIAGNOSIS — Z96653 Presence of artificial knee joint, bilateral: Secondary | ICD-10-CM | POA: Diagnosis present

## 2020-01-22 DIAGNOSIS — Z9115 Patient's noncompliance with renal dialysis: Secondary | ICD-10-CM | POA: Diagnosis not present

## 2020-01-22 DIAGNOSIS — E8889 Other specified metabolic disorders: Secondary | ICD-10-CM | POA: Diagnosis present

## 2020-01-22 DIAGNOSIS — X58XXXA Exposure to other specified factors, initial encounter: Secondary | ICD-10-CM | POA: Diagnosis present

## 2020-01-22 DIAGNOSIS — G8929 Other chronic pain: Secondary | ICD-10-CM

## 2020-01-22 DIAGNOSIS — R278 Other lack of coordination: Secondary | ICD-10-CM | POA: Diagnosis not present

## 2020-01-22 DIAGNOSIS — Z86718 Personal history of other venous thrombosis and embolism: Secondary | ICD-10-CM

## 2020-01-22 DIAGNOSIS — L89222 Pressure ulcer of left hip, stage 2: Secondary | ICD-10-CM | POA: Diagnosis present

## 2020-01-22 DIAGNOSIS — E1122 Type 2 diabetes mellitus with diabetic chronic kidney disease: Secondary | ICD-10-CM | POA: Diagnosis present

## 2020-01-22 DIAGNOSIS — N2581 Secondary hyperparathyroidism of renal origin: Secondary | ICD-10-CM | POA: Diagnosis present

## 2020-01-22 DIAGNOSIS — R52 Pain, unspecified: Secondary | ICD-10-CM | POA: Diagnosis not present

## 2020-01-22 DIAGNOSIS — M25512 Pain in left shoulder: Secondary | ICD-10-CM

## 2020-01-22 DIAGNOSIS — Z992 Dependence on renal dialysis: Secondary | ICD-10-CM | POA: Diagnosis not present

## 2020-01-22 DIAGNOSIS — L8990 Pressure ulcer of unspecified site, unspecified stage: Secondary | ICD-10-CM | POA: Diagnosis present

## 2020-01-22 DIAGNOSIS — L89212 Pressure ulcer of right hip, stage 2: Secondary | ICD-10-CM | POA: Diagnosis present

## 2020-01-22 DIAGNOSIS — Z794 Long term (current) use of insulin: Secondary | ICD-10-CM | POA: Diagnosis not present

## 2020-01-22 DIAGNOSIS — R609 Edema, unspecified: Secondary | ICD-10-CM

## 2020-01-22 DIAGNOSIS — M659 Synovitis and tenosynovitis, unspecified: Secondary | ICD-10-CM

## 2020-01-22 DIAGNOSIS — E1165 Type 2 diabetes mellitus with hyperglycemia: Secondary | ICD-10-CM | POA: Diagnosis present

## 2020-01-22 DIAGNOSIS — K219 Gastro-esophageal reflux disease without esophagitis: Secondary | ICD-10-CM | POA: Diagnosis present

## 2020-01-22 DIAGNOSIS — M65872 Other synovitis and tenosynovitis, left ankle and foot: Secondary | ICD-10-CM | POA: Diagnosis not present

## 2020-01-22 DIAGNOSIS — N186 End stage renal disease: Secondary | ICD-10-CM | POA: Diagnosis present

## 2020-01-22 DIAGNOSIS — I10 Essential (primary) hypertension: Secondary | ICD-10-CM | POA: Diagnosis not present

## 2020-01-22 DIAGNOSIS — I120 Hypertensive chronic kidney disease with stage 5 chronic kidney disease or end stage renal disease: Secondary | ICD-10-CM | POA: Diagnosis present

## 2020-01-22 DIAGNOSIS — I959 Hypotension, unspecified: Secondary | ICD-10-CM | POA: Diagnosis not present

## 2020-01-22 DIAGNOSIS — Z8249 Family history of ischemic heart disease and other diseases of the circulatory system: Secondary | ICD-10-CM

## 2020-01-22 DIAGNOSIS — G2581 Restless legs syndrome: Secondary | ICD-10-CM | POA: Diagnosis present

## 2020-01-22 DIAGNOSIS — Z806 Family history of leukemia: Secondary | ICD-10-CM

## 2020-01-22 DIAGNOSIS — R600 Localized edema: Secondary | ICD-10-CM | POA: Diagnosis present

## 2020-01-22 DIAGNOSIS — S42142A Displaced fracture of glenoid cavity of scapula, left shoulder, initial encounter for closed fracture: Principal | ICD-10-CM | POA: Diagnosis present

## 2020-01-22 DIAGNOSIS — R279 Unspecified lack of coordination: Secondary | ICD-10-CM | POA: Diagnosis not present

## 2020-01-22 DIAGNOSIS — R2681 Unsteadiness on feet: Secondary | ICD-10-CM | POA: Diagnosis not present

## 2020-01-22 DIAGNOSIS — I890 Lymphedema, not elsewhere classified: Secondary | ICD-10-CM | POA: Diagnosis present

## 2020-01-22 DIAGNOSIS — M25561 Pain in right knee: Secondary | ICD-10-CM | POA: Diagnosis not present

## 2020-01-22 DIAGNOSIS — M545 Low back pain: Secondary | ICD-10-CM | POA: Diagnosis not present

## 2020-01-22 DIAGNOSIS — Z20822 Contact with and (suspected) exposure to covid-19: Secondary | ICD-10-CM | POA: Diagnosis present

## 2020-01-22 DIAGNOSIS — R2689 Other abnormalities of gait and mobility: Secondary | ICD-10-CM | POA: Diagnosis present

## 2020-01-22 DIAGNOSIS — M8580 Other specified disorders of bone density and structure, unspecified site: Secondary | ICD-10-CM | POA: Diagnosis present

## 2020-01-22 DIAGNOSIS — Z825 Family history of asthma and other chronic lower respiratory diseases: Secondary | ICD-10-CM

## 2020-01-22 DIAGNOSIS — M25511 Pain in right shoulder: Secondary | ICD-10-CM | POA: Diagnosis present

## 2020-01-22 DIAGNOSIS — G4733 Obstructive sleep apnea (adult) (pediatric): Secondary | ICD-10-CM | POA: Diagnosis present

## 2020-01-22 DIAGNOSIS — M159 Polyosteoarthritis, unspecified: Secondary | ICD-10-CM | POA: Diagnosis not present

## 2020-01-22 DIAGNOSIS — R0902 Hypoxemia: Secondary | ICD-10-CM | POA: Diagnosis not present

## 2020-01-22 DIAGNOSIS — M5489 Other dorsalgia: Secondary | ICD-10-CM | POA: Diagnosis not present

## 2020-01-22 DIAGNOSIS — Z638 Other specified problems related to primary support group: Secondary | ICD-10-CM

## 2020-01-22 DIAGNOSIS — M109 Gout, unspecified: Secondary | ICD-10-CM | POA: Diagnosis present

## 2020-01-22 DIAGNOSIS — S42142D Displaced fracture of glenoid cavity of scapula, left shoulder, subsequent encounter for fracture with routine healing: Secondary | ICD-10-CM | POA: Diagnosis not present

## 2020-01-22 DIAGNOSIS — E669 Obesity, unspecified: Secondary | ICD-10-CM | POA: Diagnosis present

## 2020-01-22 DIAGNOSIS — D631 Anemia in chronic kidney disease: Secondary | ICD-10-CM | POA: Diagnosis present

## 2020-01-22 DIAGNOSIS — Z743 Need for continuous supervision: Secondary | ICD-10-CM | POA: Diagnosis not present

## 2020-01-22 DIAGNOSIS — Z87891 Personal history of nicotine dependence: Secondary | ICD-10-CM

## 2020-01-22 DIAGNOSIS — E785 Hyperlipidemia, unspecified: Secondary | ICD-10-CM | POA: Diagnosis present

## 2020-01-22 DIAGNOSIS — Z79899 Other long term (current) drug therapy: Secondary | ICD-10-CM

## 2020-01-22 DIAGNOSIS — L89309 Pressure ulcer of unspecified buttock, unspecified stage: Secondary | ICD-10-CM

## 2020-01-22 DIAGNOSIS — Z9181 History of falling: Secondary | ICD-10-CM | POA: Diagnosis not present

## 2020-01-22 DIAGNOSIS — Z7901 Long term (current) use of anticoagulants: Secondary | ICD-10-CM

## 2020-01-22 DIAGNOSIS — L8995 Pressure ulcer of unspecified site, unstageable: Secondary | ICD-10-CM | POA: Diagnosis not present

## 2020-01-22 DIAGNOSIS — E1129 Type 2 diabetes mellitus with other diabetic kidney complication: Secondary | ICD-10-CM | POA: Diagnosis not present

## 2020-01-22 DIAGNOSIS — L89202 Pressure ulcer of unspecified hip, stage 2: Secondary | ICD-10-CM | POA: Diagnosis not present

## 2020-01-22 LAB — CBC
HCT: 35.2 % — ABNORMAL LOW (ref 36.0–46.0)
Hemoglobin: 10.6 g/dL — ABNORMAL LOW (ref 12.0–15.0)
MCH: 29 pg (ref 26.0–34.0)
MCHC: 30.1 g/dL (ref 30.0–36.0)
MCV: 96.2 fL (ref 80.0–100.0)
Platelets: 272 10*3/uL (ref 150–400)
RBC: 3.66 MIL/uL — ABNORMAL LOW (ref 3.87–5.11)
RDW: 18.4 % — ABNORMAL HIGH (ref 11.5–15.5)
WBC: 11.8 10*3/uL — ABNORMAL HIGH (ref 4.0–10.5)
nRBC: 0 % (ref 0.0–0.2)

## 2020-01-22 LAB — BASIC METABOLIC PANEL
Anion gap: 12 (ref 5–15)
BUN: 98 mg/dL — ABNORMAL HIGH (ref 8–23)
CO2: 28 mmol/L (ref 22–32)
Calcium: 8.2 mg/dL — ABNORMAL LOW (ref 8.9–10.3)
Chloride: 103 mmol/L (ref 98–111)
Creatinine, Ser: 5.39 mg/dL — ABNORMAL HIGH (ref 0.44–1.00)
GFR calc Af Amer: 8 mL/min — ABNORMAL LOW (ref >60)
GFR calc non Af Amer: 7 mL/min — ABNORMAL LOW (ref >60)
Glucose, Bld: 174 mg/dL — ABNORMAL HIGH (ref 70–99)
Potassium: 4.5 mmol/L (ref 3.5–5.1)
Sodium: 143 mmol/L (ref 135–145)

## 2020-01-22 MED ORDER — PANTOPRAZOLE SODIUM 40 MG PO TBEC
40.0000 mg | DELAYED_RELEASE_TABLET | Freq: Every day | ORAL | Status: DC
  Administered 2020-01-23 – 2020-01-25 (×3): 40 mg via ORAL
  Filled 2020-01-22 (×3): qty 1

## 2020-01-22 MED ORDER — ROPINIROLE HCL ER 4 MG PO TB24
8.0000 mg | ORAL_TABLET | Freq: Every day | ORAL | Status: DC
  Administered 2020-01-23 – 2020-01-24 (×3): 8 mg via ORAL
  Filled 2020-01-22 (×4): qty 2

## 2020-01-22 MED ORDER — GABAPENTIN 300 MG PO CAPS
300.0000 mg | ORAL_CAPSULE | Freq: Every day | ORAL | Status: DC
  Administered 2020-01-23 – 2020-01-24 (×3): 300 mg via ORAL
  Filled 2020-01-22 (×3): qty 1

## 2020-01-22 MED ORDER — FEBUXOSTAT 40 MG PO TABS
40.0000 mg | ORAL_TABLET | Freq: Every day | ORAL | Status: DC
  Administered 2020-01-23 – 2020-01-25 (×3): 40 mg via ORAL
  Filled 2020-01-22 (×4): qty 1

## 2020-01-22 MED ORDER — CYCLOBENZAPRINE HCL 10 MG PO TABS
10.0000 mg | ORAL_TABLET | Freq: Every day | ORAL | Status: DC
  Administered 2020-01-23 – 2020-01-24 (×3): 10 mg via ORAL
  Filled 2020-01-22 (×3): qty 1

## 2020-01-22 MED ORDER — CARBIDOPA-LEVODOPA 10-100 MG PO TABS
1.0000 | ORAL_TABLET | Freq: Three times a day (TID) | ORAL | Status: DC
  Administered 2020-01-23 – 2020-01-25 (×6): 1 via ORAL
  Filled 2020-01-22 (×10): qty 1

## 2020-01-22 MED ORDER — OXYCODONE-ACETAMINOPHEN 5-325 MG PO TABS
1.0000 | ORAL_TABLET | Freq: Once | ORAL | Status: AC
  Administered 2020-01-22: 1 via ORAL
  Filled 2020-01-22: qty 1

## 2020-01-22 MED ORDER — INSULIN NPH (HUMAN) (ISOPHANE) 100 UNIT/ML ~~LOC~~ SUSP
50.0000 [IU] | Freq: Two times a day (BID) | SUBCUTANEOUS | Status: DC
  Administered 2020-01-23 (×2): 50 [IU] via SUBCUTANEOUS
  Filled 2020-01-22: qty 10

## 2020-01-22 MED ORDER — APIXABAN 2.5 MG PO TABS
2.5000 mg | ORAL_TABLET | Freq: Two times a day (BID) | ORAL | Status: DC
  Administered 2020-01-23 – 2020-01-25 (×5): 2.5 mg via ORAL
  Filled 2020-01-22 (×8): qty 1

## 2020-01-22 MED ORDER — ALPRAZOLAM 0.25 MG PO TABS
0.2500 mg | ORAL_TABLET | ORAL | Status: DC
  Administered 2020-01-23: 0.25 mg via ORAL
  Filled 2020-01-22: qty 1

## 2020-01-22 MED ORDER — OXYCODONE-ACETAMINOPHEN 5-325 MG PO TABS: 1.0000 | ORAL_TABLET | Freq: Four times a day (QID) | ORAL | Status: DC | PRN

## 2020-01-22 MED ORDER — BETAMETHASONE SOD PHOS & ACET 6 (3-3) MG/ML IJ SUSP: 3.0000 mg | Freq: Once | INTRAMUSCULAR | Status: DC

## 2020-01-22 MED ORDER — CALCIUM ACETATE (PHOS BINDER) 667 MG PO CAPS
667.0000 mg | ORAL_CAPSULE | Freq: Three times a day (TID) | ORAL | Status: DC
  Administered 2020-01-23 – 2020-01-25 (×6): 667 mg via ORAL
  Filled 2020-01-22 (×8): qty 1

## 2020-01-22 MED ORDER — PRAVASTATIN SODIUM 40 MG PO TABS
40.0000 mg | ORAL_TABLET | Freq: Every day | ORAL | Status: DC
  Administered 2020-01-24: 40 mg via ORAL
  Filled 2020-01-22: qty 1

## 2020-01-22 NOTE — ED Provider Notes (Signed)
[facility] EMERGENCY DEPARTMENT Provider Note   CSN: [id number] Arrival date & time: 01/22/20  1245     History Chief Complaint  Patient presents with  . Back Pain  . Knee Pain    Jean Mcclain is a 74 y.o. female.  She has multiple medical problems and lives alone.  She used to have services but could no longer afford them.  She is here with complaint of both of her shoulders that is been going on for a few months.  Her doctor recently started her on some oxycodone.  She also pain in her right knee after she banged it using her motorized wheelchair.  She said she has been unable to put any weight on it since then.  She is in her wheelchair most of the time and that caused some breakdown on her buttocks.  They have been applying some salve to the area.  She also has not gone to dialysis for a week.  She does not think she is able to take care of herself anymore and she thinks she may need to go to a nursing home.  The history is provided by the patient.  Knee Pain Location:  Knee Injury: yes   Mechanism of injury comment:  Direct blow Knee location:  R knee Pain details:    Quality:  Aching   Radiates to:  Does not radiate   Severity:  Moderate   Onset quality:  Sudden   Timing:  Constant   Progression:  Unchanged Chronicity:  New Dislocation: no   Relieved by:  Nothing Worsened by:  Bearing weight Ineffective treatments:  None tried Associated symptoms: back pain   Associated symptoms: no fever        Past Medical History:  Diagnosis Date  . Anxiety   . Arthritis    osteo  . Chronic kidney disease   . Diabetes mellitus, type 2 (HCC)    Type II  . DVT (deep venous thrombosis) ([place]) 03/2017   left popliteal  . Facet joint disease   . Facet joint disease    foot  . Fibula fracture 02/2017   left  . GERD (gastroesophageal reflux disease)   . Hyperlipidemia   . Hypertension   . OSA (obstructive sleep apnea)    does not use CPAP   .  Osteopenia   . Shortness of breath    with exertion  . Venous stasis ulcers ([place])   . Vitamin D deficiency     Patient Active Problem List   Diagnosis Date Noted  . ESRD on hemodialysis ([place]) 11/20/2019  . Uremia of renal origin 11/20/2019  . Myoclonus, segmental 11/20/2019  . OSA (obstructive sleep apnea) 03/13/2019  . Chronic acquired lymphedema 11/05/2018  . Acute respiratory distress   . Acute on chronic diastolic CHF (congestive heart failure) ([place])   . AKI (acute kidney injury) ([place]) 03/30/2017  . Sepsis ([place]) 03/30/2017  . Acute deep vein thrombosis (DVT) of popliteal vein of left lower extremity ([place]) 03/11/2017  . Renal insufficiency 03/11/2017  . Insulin-requiring or dependent type II diabetes mellitus ([place]) 03/11/2017  . Essential hypertension 03/11/2017  . Impingement syndrome of left ankle 01/31/2017  . Posterior tibial tendinitis, left leg 01/31/2017  . Incarcerated ventral hernia 01/02/2013    Past Surgical History:  Procedure Laterality Date  . AV FISTULA PLACEMENT Right 08/20/2018   Procedure: ARTERIOVENOUS (AV) FISTULA CREATION BRACHIOCEPHALIC;  Surgeon: Angelia Mould, MD;  Location: [facility];  Service: Vascular;  Laterality: Right;  . BREAST SURGERY Right 03/1997   biospy  . CARPAL TUNNEL RELEASE Left 2016  . INSERTION OF MESH N/A 01/08/2013   Procedure: INSERTION OF MESH;  Surgeon: Earnstine Regal, MD;  [facility];  Service: General;  Laterality: N/A;  . IR THORACENTESIS ASP PLEURAL SPACE W/IMG GUIDE  04/06/2017  . JOINT REPLACEMENT Right 1999  . TOTAL KNEE ARTHROPLASTY Left 1999   Left  . TRIGGER FINGER RELEASE Left 11/2014  . TRIGGER FINGER RELEASE Right   . VENTRAL HERNIA REPAIR N/A 01/08/2013   Procedure: LAPAROSCOPIC VENTRAL HERNIA;  Surgeon: Earnstine Regal, MD;  [facility];  Service: General;  Laterality: N/A;     OB History    Gravida  0   Para  0   Term  0   Preterm  0   AB  0   Living  0     SAB  0   TAB  0    Ectopic  0   Multiple  0   Live Births  0           Family History  Problem Relation Age of Onset  . Leukemia Mother   . Hypertension Brother   . Emphysema Father     Social History   Tobacco Use  . Smoking status: Former Smoker    Years: 27.00    Types: Cigarettes    Quit date: 10/10/1981    Years since quitting: 38.3  . Smokeless tobacco: Never Used  Substance Use Topics  . Alcohol use: No  . Drug use: No    Home Medications Prior to Admission medications   Medication Sig Start Date End Date Taking? Authorizing Provider  acetaminophen (TYLENOL) 500 MG tablet Take 1,000 mg by mouth 3 (three) times daily as needed for moderate pain or headache.    [provider]  ALPRAZolam Duanne Moron) 0.25 MG tablet Take before the beginning of HD treatment , 1 tab 11/20/19   Dohmeier, Asencion Partridge, MD  apixaban (ELIQUIS) 2.5 MG TABS tablet Take 2.5 mg by mouth 2 (two) times daily.    [provider]  B-D INS SYR ULTRAFINE 1CC/30G 30G X 1/2" 1 ML MISC  12/23/19   [provider]  calcium acetate (PHOSLO) 667 MG tablet Take 667 mg by mouth 3 (three) times daily. 11/28/19   [provider]  carbidopa-levodopa (SINEMET IR) 10-100 MG tablet  11/23/19   [provider]  colchicine 0.6 MG tablet Take 0.6 mg by mouth daily as needed (gout flare).  02/21/17   [provider]  cyclobenzaprine (FLEXERIL) 10 MG tablet Take 10 mg by mouth at bedtime.     [provider]  febuxostat (ULORIC) 40 MG tablet Take 40 mg by mouth daily.    [provider]  gabapentin (NEURONTIN) 300 MG capsule Take 300 mg by mouth at bedtime. 11/14/19   [provider]  glucose blood test strip OneTouch Verio test strips    [provider]  hydrOXYzine (ATARAX/VISTARIL) 10 MG tablet Take 1 tablet (10 mg total) by mouth 2 (two) times daily as needed. 11/20/19   Dohmeier, Asencion Partridge, MD  insulin NPH Human (HUMULIN N,NOVOLIN N) 100 UNIT/ML injection Inject  50 Units into the skin 2 (two) times daily.     [provider]  insulin regular (NOVOLIN R,HUMULIN R) 100 units/mL injection Inject 10-30 Units into the skin 3 (three) times daily before meals.     [provider]  Lancets [facility]  DELICA PLUS [id number]) [place]  03/05/19   [provider]  lidocaine (LIDODERM) 5 % Place 1 patch onto the skin daily. Remove & Discard patch within 12 hours or as directed by MD 12/11/18   Landis Martins, DPM  lidocaine-prilocaine (EMLA) cream Apply 1 application topically See admin instructions. 1 hour before dialysis to port 09/28/19   [provider]  lovastatin (MEVACOR) 40 MG tablet Take 40 mg by mouth at bedtime.     [provider]  multivitamin (RENA-VIT) TABS tablet Take 1 tablet by mouth daily. 10/09/19   [provider]  omeprazole (PRILOSEC) 20 MG capsule Take 20 mg by mouth daily.    [provider]  rOPINIRole (REQUIP XL) 8 MG 24 hr tablet Take 1 tablet (8 mg total) by mouth at bedtime. 11/20/19   Dohmeier, Asencion Partridge, MD  triamcinolone cream (KENALOG) 0.1 % Apply 1 application topically 2 (two) times daily as needed (itching).  06/20/19   [provider]  Tuberculin PPD (TUBERSOL ID) Inject into the skin. 12/07/19   [provider]    Allergies    Patient has no known allergies.  Review of Systems   Review of Systems  Constitutional: Negative for fever.  HENT: Negative for sore throat.   Eyes: Negative for visual disturbance.  Respiratory: Negative for shortness of breath.   Cardiovascular: Positive for leg swelling. Negative for chest pain.  Gastrointestinal: Negative for abdominal pain.  Genitourinary: Negative for dysuria.  Musculoskeletal: Positive for arthralgias (shoulders), back pain and gait problem.  Skin: Positive for wound. Negative for rash.  Neurological: Negative for headaches.    Physical Exam Updated Vital Signs BP (!) 180/82   Pulse 98   Temp 97.7 F  (36.5 C) (Oral)   Resp 18   SpO2 94%   Physical Exam Vitals and nursing note reviewed.  Constitutional:      General: She is not in acute distress.    Appearance: She is well-developed.  HENT:     Head: Normocephalic and atraumatic.  Eyes:     Conjunctiva/sclera: Conjunctivae normal.  Cardiovascular:     Rate and Rhythm: Normal rate and regular rhythm.     Heart sounds: No murmur.  Pulmonary:     Effort: Pulmonary effort is normal. No respiratory distress.     Breath sounds: Normal breath sounds.  Abdominal:     Palpations: Abdomen is soft.     Tenderness: There is no abdominal tenderness.  Musculoskeletal:     Cervical back: Neck supple.     Right lower leg: Edema present.     Left lower leg: Edema present.     Comments: She has some diffuse tenderness with range of motion in her shoulders.  Normal landmarks.  Scars over both of her knees.  Right knee is diffusely tender.  Unable to appreciate any significant effusions.  No overlying erythema.  Left lower extremity more swollen than right with some stasis erythema in the lower half.  Skin:    General: Skin is warm and dry.     Comments: She has skin breakdown about the size of my palm at the posterior upper thigh and gluteal intersection on both legs.  There is some serous seepage.  Neurological:     General: No focal deficit present.     Mental Status: She is alert and oriented to person, place, and time.     ED Results / Procedures / Treatments   Labs (all labs ordered are listed, but only abnormal  results are displayed) Labs Reviewed  BASIC METABOLIC PANEL - Abnormal; Notable for the following components:      Result Value   Glucose, Bld 174 (*)    BUN 98 (*)    Creatinine, Ser 5.39 (*)    Calcium 8.2 (*)    GFR calc non Af Amer 7 (*)    GFR calc Af Amer 8 (*)    All other components within normal limits  CBC - Abnormal; Notable for the following components:   WBC 11.8 (*)    RBC 3.66 (*)    Hemoglobin 10.6  (*)    HCT 35.2 (*)    RDW 18.4 (*)    All other components within normal limits    EKG EKG Interpretation  Date/Time:  Wednesday January 22 2020 12:58:53 EDT Ventricular Rate:  84 PR Interval:  162 QRS Duration: 86 QT Interval:  386 QTC Calculation: 456 R Axis:   -18 Text Interpretation: Normal sinus rhythm Minimal voltage criteria for LVH, may be normal variant ( R in aVL ) Nonspecific ST and T wave abnormality Abnormal ECG No significant change since prior 11/19 Confirmed by Aletta Edouard [phone]) on 01/22/2020 3:05:11 PM   Radiology DG Shoulder Right  Result Date: 01/22/2020 CLINICAL DATA:  Acute right shoulder pain without known injury. EXAM: RIGHT SHOULDER - 2+ VIEW COMPARISON:  None. FINDINGS: There is no evidence of fracture or dislocation. No joint space narrowing is noted. Osteophyte formation is seen involving the glenoid fossa. Soft tissues are unremarkable. 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 No acute abnormality seen in the right shoulder. Electronically Signed   By: Marijo Conception M.D.   On: 01/22/2020 16:12   DG Shoulder Left  Result Date: 01/22/2020 CLINICAL DATA:  Acute left shoulder pain without known injury. EXAM: LEFT SHOULDER - 2+ VIEW COMPARISON:  April 01, 2017. FINDINGS: Possible mildly displaced fracture is seen involving osteophyte arising from the inferior aspect of the glenoid fossa. Humerus and clavicle are unremarkable. There is no evidence of arthropathy or other focal bone abnormality. Soft tissues are unremarkable. IMPRESSION: Possible mildly displaced fracture involving osteophyte arising from inferior aspect of glenoid fossa. Electronically Signed   By: Marijo Conception M.D.   On: 01/22/2020 16:10   DG Knee Complete 4 Views Right  Result Date: 01/22/2020 CLINICAL DATA:  Right knee pain after injury 2 days ago. EXAM: RIGHT KNEE - COMPLETE 4+ VIEW COMPARISON:  None. FINDINGS: Status post right total knee arthroplasty.  The right femoral and tibial components appear to be well situated. No fracture or dislocation is noted. No joint effusion is noted. IMPRESSION: Status post right total knee arthroplasty. No acute abnormality seen in the right knee. Electronically Signed   By: Marijo Conception M.D.   On: 01/22/2020 16:07    Procedures Procedures (including critical care time)  Medications Ordered in ED Medications  oxyCODONE-acetaminophen (PERCOCET/ROXICET) 5-325 MG per tablet 1 tablet (has no administration in time range)    ED Course  I have reviewed the triage vital signs and the nursing notes.  Pertinent labs & imaging results that were available during my care of the patient were reviewed by me and considered in my medical decision making (see chart for details).  Clinical Course as of Jan 22 1010  Wed Jan 22, 2020  1550 X-rays of bilateral shoulders interpreted by me as no fractures or dislocations but a lot of osteophyte.  Knee x-rays show hardware intact from a total  knee replacement   [MB]  28 Lab work showing anemia but stable at her baseline.  BUN and creatinine elevated consistent with her CKD but her potassium was normal.   [MB]  1650 Patient's imaging read as possible osteophyte fracture on the left shoulder.  Reviewed this with Mr Gorden Harms from orthopedics who said that orthopedics would not do anything with this.   [MB]  7893 Patient states the pain that did not help her at all.   [MB]  1922 Patient was seen by [facility] and they have arranged for home health services for her.  She will be returned to home by ambulance.  Patient is comfortable with plan.   [MB]  2047 Patient's brother is here now and states she can go home alone.  He said he can stay with her tomorrow but can go home with her tonight.  TOC is recommended that she board here until tomorrow and they can help get her set up with more services and he can help her at home.   [MB]  2216 Transition of care is now recommending the  patient be admitted if possible.  They said her pain management would not be appropriate in the ED.   [MB]  2224 Discussed with [facility] hospitalist Dr. Cyd Silence who will evaluate the patient for admission or observation.   [MB]    Clinical Course User Index [MB] Hayden Rasmussen, MD   MDM Rules/Calculators/A&P                     This patient complains of pain in her shoulders and right knee along with chronic low back pain and decreased ability to care for herself; this involves an extensive number of treatment Options and is a complaint that carries with it a high risk of complications and Morbidity. The differential includes metabolic derangement as she has not gone to dialysis in a week.  Also consider fracture, arthritis, dislocation, gout.  Also her left lower extremity is markedly swollen although she is on anticoagulation and had this evaluated a few months ago with a negative DVT study.    I ordered, reviewed and interpreted labs, which included elevation WBCs 12.4 unclear significance.  Hemoglobin chronic.  Creatinine elevated at 5 chronic.  Potassium.  Covid testing negative. I ordered medication oxycodone for pain along with her regular oral medications I ordered imaging studies which included x-ray right and left shoulder and right knee and I independently    visualized and interpreted imaging which showed degenerative changes, also possible osteophyte fracture on the left. Additional history obtained from brother Previous records obtained and reviewed in epic I consulted orthopedics PA Mr Gorden Harms along with [facility] hospitalist Dr. Marlyce Huge and discussed lab and imaging findings  After the interventions stated above, I reevaluated the patient and found the patient still to have considerable amount of pain and difficulty with any type of transfer.  I also consulted transitions of care to see if they can set her up with services at home.  Ultimately it was felt to be safer for the  patient if she would be admitted   Final Clinical Impression(s) / ED Diagnoses Final diagnoses:  Chronic bilateral low back pain without sciatica  Acute pain of both shoulders  Peripheral edema  ESRD (end stage renal disease) on dialysis ([place])  Pressure injury of skin of buttock, unspecified injury stage, unspecified laterality    Rx / DC Orders ED Discharge Orders    None  Hayden Rasmussen, MD 01/23/20 1016

## 2020-01-22 NOTE — Progress Notes (Signed)
TOC team sent information for both SNF placement and HH due to complex nature of case. Team informed Pt of these measures and why we were doing both. Pt has multiple needs. Team has also reached out to [facility] to update on Pt.

## 2020-01-22 NOTE — TOC Initial Note (Signed)
Transition of Care [facility]) - Initial/Assessment Note    Patient Details  Name: Jean Mcclain MRN: [id number] Date of Birth: Mar 04, 1946  Transition of Care [employer]) CM/SW Contact:    Vergie Living, LCSW Phone Number: 01/22/2020, 10:00 PM  Clinical Narrative:   Pt is 74 y/o A&Ox4 who presents as agitated due to pain. Pt has has multiple medical issues.                Expected Discharge Plan: Skilled Nursing Facility Barriers to Discharge: No Barriers Identified   Patient Goals and CMS Choice Patient states their goals for this hospitalization and ongoing recovery are:: Conttol pain /heal wounds/feel better CMS Medicare.gov Compare Post Acute Care list provided to:: Patient Choice offered to / list presented to : Patient, Sibling(Bernie Gold-Brother)  Expected Discharge Plan and Services Expected Discharge Plan: [place] Choice: [place] arrangements for the past 2 months: Single Family Home                                      Prior Living Arrangements/Services Living arrangements for the past 2 months: Single Family Home Lives with:: Self Patient language and need for interpreter reviewed:: No Do you feel safe going back to the place where you live?: Yes      Need for Family Participation in Patient Care: Yes (Comment) Care giver support system in place?: Yes (comment)   Criminal Activity/Legal Involvement Pertinent to Current Situation/Hospitalization: No - Comment as needed  Activities of Daily Living      Permission Sought/Granted Permission sought to share information with : Facility Art therapist granted to share information with : Yes, Verbal Permission Granted  Share Information with NAME: Rachel Moulds     Permission granted to share info w Relationship: Brother  Permission granted to share info w Contact Information: Marilynn Rail [phone]  Emotional  Assessment Appearance:: Appears older than stated age Attitude/Demeanor/Rapport: Reactive Affect (typically observed): Agitated, Appropriate Orientation: : Oriented to Self, Oriented to Place, Oriented to  Time, Oriented to Situation Alcohol / Substance Use: Not Applicable    Admission diagnosis:  Back pain Patient Active Problem List   Diagnosis Date Noted  . Pressure injury of skin 01/22/2020  . ESRD on hemodialysis ([place]) 11/20/2019  . Uremia of renal origin 11/20/2019  . Myoclonus, segmental 11/20/2019  . OSA (obstructive sleep apnea) 03/13/2019  . Chronic acquired lymphedema 11/05/2018  . Acute respiratory distress   . Acute on chronic diastolic CHF (congestive heart failure) ([place])   . AKI (acute kidney injury) ([place]) 03/30/2017  . Sepsis ([place]) 03/30/2017  . Acute deep vein thrombosis (DVT) of popliteal vein of left lower extremity ([place]) 03/11/2017  . Renal insufficiency 03/11/2017  . Insulin-requiring or dependent type II diabetes mellitus ([place]) 03/11/2017  . Essential hypertension 03/11/2017  . Impingement syndrome of left ankle 01/31/2017  . Posterior tibial tendinitis, left leg 01/31/2017  . Incarcerated ventral hernia 01/02/2013   PCP:  Leanna Battles, MD Pharmacy:   [facility] [address], [place] [facility] [address] [place] [address] Phone: [phone] Fax: [phone]     Social Determinants of Health ([place]) Interventions    Readmission Risk Interventions No flowsheet data found.

## 2020-01-22 NOTE — Care Management (Signed)
ED CM/CSW met with patient at bedside to assist with transitional care plan options. Patient reports ESRD on HD, DM, wheelchair bound and is having a difficulties caring for self, patient lives alone.  Patient states, she has had PCA in the past assistance but can no longer afford the cost.  Patient tells me that she thinks she may be at the point where she need an ALF.  Patient has [employer] and may be eligible for PCA program for [employer] members.  CM also discussed [place] services patient thinks she may be able to stay at home with PCA assistance.  TOC spoke with patient's brother who tells [place] that patient is in constant pain due to  sacral wounds that has not be address by her PCP. CM updated EDP with this information.  He states that if patient is discharged tonight she will be alone because he could not stay tonight with her. He also states patient has missed 2 HD visits due to the pain. Discussed with ED CSW CM will reach out to [facility] to assist with coordinating transitional care planning. ED TOC will continue to follow patient to assist with care planning.

## 2020-01-22 NOTE — NC FL2 (Signed)
[place] MEDICAID FL2 LEVEL OF CARE SCREENING TOOL     IDENTIFICATION  Patient Name: Jean Mcclain Birthdate: 1946/01/14 Sex: female Admission Date (Current Location): 01/22/2020  [facility] and [place] Number:  Herbalist and Address:  [facility] [place], [place] [address] [place], [place] [address]      Provider Number: [id number]  Attending Physician Name and Address:  Hayden Rasmussen, MD  Relative Name and Phone Number:  Marilynn Rail [phone]    Current Level of Care: Hospital Recommended Level of Care: [place] Prior Approval Number:    Date Approved/Denied:   PASRR Number: [id number]  Discharge Plan: SNF    Current Diagnoses: Patient Active Problem List   Diagnosis Date Noted  . Pressure injury of skin 01/22/2020  . ESRD on hemodialysis ([place]) 11/20/2019  . Uremia of renal origin 11/20/2019  . Myoclonus, segmental 11/20/2019  . OSA (obstructive sleep apnea) 03/13/2019  . Chronic acquired lymphedema 11/05/2018  . Acute respiratory distress   . Acute on chronic diastolic CHF (congestive heart failure) ([place])   . AKI (acute kidney injury) ([place]) 03/30/2017  . Sepsis ([place]) 03/30/2017  . Acute deep vein thrombosis (DVT) of popliteal vein of left lower extremity ([place]) 03/11/2017  . Renal insufficiency 03/11/2017  . Insulin-requiring or dependent type II diabetes mellitus ([place]) 03/11/2017  . Essential hypertension 03/11/2017  . Impingement syndrome of left ankle 01/31/2017  . Posterior tibial tendinitis, left leg 01/31/2017  . Incarcerated ventral hernia 01/02/2013    Orientation RESPIRATION BLADDER Height & Weight     Self, Time, Situation, Place    Continent, External catheter(Continent at baseline) Weight:   Height:     BEHAVIORAL SYMPTOMS/MOOD NEUROLOGICAL BOWEL NUTRITION STATUS      Continent Diet(Renal diet)  AMBULATORY STATUS COMMUNICATION OF NEEDS Skin   Extensive Assist Verbally Skin abrasions(Bed  sores on buttocks)                       Personal Care Assistance Level of Assistance  Bathing, Feeding, Dressing Bathing Assistance: Limited assistance Feeding assistance: Limited assistance Dressing Assistance: Limited assistance     Functional Limitations Info  Sight, Hearing, Speech Sight Info: Adequate Hearing Info: Adequate Speech Info: Adequate    SPECIAL CARE FACTORS FREQUENCY                       Contractures Contractures Info: Not present    Additional Factors Info  Code Status Code Status Info: Full code             Current Medications (01/22/2020):  This is the current hospital active medication list Current Facility-Administered Medications  Medication Dose Route Frequency Provider Last Rate Last Admin  . ALPRAZolam Duanne Moron) tablet 0.25 mg  0.25 mg Oral See admin instructions Hayden Rasmussen, MD      . apixaban Arne Cleveland) tablet 2.5 mg  2.5 mg Oral BID Hayden Rasmussen, MD      . betamethasone acetate-betamethasone sodium phosphate (CELESTONE) injection 3 mg  3 mg Intramuscular Once Edrick Kins, DPM      . [START ON 01/23/2020] calcium acetate (PHOSLO) capsule 667 mg  667 mg Oral TID with meals Hayden Rasmussen, MD      . carbidopa-levodopa (SINEMET IR) 10-100 MG per tablet immediate release 1 tablet  1 tablet Oral TID Hayden Rasmussen, MD      . cyclobenzaprine (FLEXERIL) tablet 10 mg  10 mg  Oral QHS Hayden Rasmussen, MD      . febuxostat Melburn Popper) tablet 40 mg  40 mg Oral Daily Hayden Rasmussen, MD      . gabapentin (NEURONTIN) capsule 300 mg  300 mg Oral QHS Hayden Rasmussen, MD      . insulin NPH Human (NOVOLIN N) injection 50 Units  50 Units Subcutaneous BID Hayden Rasmussen, MD      . oxyCODONE-acetaminophen (PERCOCET/ROXICET) 5-325 MG per tablet 1 tablet  1 tablet Oral Q6H PRN Hayden Rasmussen, MD      . pantoprazole (PROTONIX) EC tablet 40 mg  40 mg Oral Daily Hayden Rasmussen, MD      . Derrill Memo ON 01/23/2020] pravastatin  (PRAVACHOL) tablet 40 mg  40 mg Oral q1800 Hayden Rasmussen, MD      . rOPINIRole (REQUIP XL) 24 hr tablet 8 mg  8 mg Oral QHS Hayden Rasmussen, MD       Current Outpatient Medications  Medication Sig Dispense Refill  . acetaminophen (TYLENOL) 500 MG tablet Take 1,000 mg by mouth 3 (three) times daily as needed for moderate pain or headache.    . ALPRAZolam (XANAX) 0.25 MG tablet Take before the beginning of HD treatment , 1 tab (Patient taking differently: Take 0.25 mg by mouth See admin instructions. Take before the beginning of HD treatment , 1 tab) 30 tablet 1  . apixaban (ELIQUIS) 2.5 MG TABS tablet Take 2.5 mg by mouth 2 (two) times daily.    . B-D INS SYR ULTRAFINE 1CC/30G 30G X 1/2" 1 ML MISC 1 each by Other route daily.     . calcium acetate (PHOSLO) 667 MG tablet Take 667 mg by mouth 3 (three) times daily.    . carbidopa-levodopa (SINEMET IR) 10-100 MG tablet Take 1 tablet by mouth 3 (three) times daily.     . colchicine 0.6 MG tablet Take 0.6 mg by mouth daily as needed (gout flare).     . cyclobenzaprine (FLEXERIL) 10 MG tablet Take 10 mg by mouth at bedtime.     . febuxostat (ULORIC) 40 MG tablet Take 40 mg by mouth daily.    Marland Kitchen gabapentin (NEURONTIN) 300 MG capsule Take 300 mg by mouth at bedtime.    Marland Kitchen glucose blood test strip 1 each by Other route daily.     . hydrOXYzine (ATARAX/VISTARIL) 10 MG tablet Take 1 tablet (10 mg total) by mouth 2 (two) times daily as needed. (Patient taking differently: Take 10 mg by mouth 2 (two) times daily as needed for itching or anxiety. ) 60 tablet 5  . insulin NPH Human (HUMULIN N,NOVOLIN N) 100 UNIT/ML injection Inject 50 Units into the skin 2 (two) times daily.     . insulin regular (NOVOLIN R,HUMULIN R) 100 units/mL injection Inject 10-30 Units into the skin 3 (three) times daily before meals.     . Lancets (ONETOUCH DELICA PLUS [id number]) MISC 1 each by Other route daily.     Marland Kitchen lidocaine (LIDODERM) 5 % Place 1 patch onto the skin daily.  Remove & Discard patch within 12 hours or as directed by MD 30 patch 0  . lidocaine-prilocaine (EMLA) cream Apply 1 application topically See admin instructions. 1 hour before dialysis to port    . lovastatin (MEVACOR) 40 MG tablet Take 40 mg by mouth at bedtime.     . multivitamin (RENA-VIT) TABS tablet Take 1 tablet by mouth daily.    Marland Kitchen omeprazole (PRILOSEC) 20 MG capsule Take  20 mg by mouth daily.    Marland Kitchen rOPINIRole (REQUIP XL) 8 MG 24 hr tablet Take 1 tablet (8 mg total) by mouth at bedtime. 90 tablet 1  . triamcinolone cream (KENALOG) 0.1 % Apply 1 application topically 2 (two) times daily as needed (itching).     . Tuberculin PPD (TUBERSOL ID) Inject into the skin.       Discharge Medications: Please see discharge summary for a list of discharge medications.  Relevant Imaging Results:  Relevant Lab Results:   [facility]  [phone] SSN :[id number]  Vergie Living, LCSW

## 2020-01-22 NOTE — Social Work (Signed)
CSW met with Pt at bedside to discuss various options for discharge planning. Pt expressed discontent with options presented. CSW will consult with [facility] RN and continue to follow for discharge planning.

## 2020-01-22 NOTE — ED Triage Notes (Addendum)
Pt from home with PTAR for c.o pressure sores to her back, pain in her right knee and pt would like nursing home placement, states she lives alone and can no longer take care of herself. Pt a.o, has not had dialysis since last friday

## 2020-01-22 NOTE — Discharge Instructions (Addendum)
You were seen in the emergency department for evaluation of pain in your shoulders your right knee your low back along with some pressure injuries over your buttocks.  You also had not been to dialysis in a week.  Your lab work did not show any serious elevation in your potassium.  It will be important that you go to dialysis.  You had x-rays of your shoulders that showed a possible fracture of some calcification in the shoulder.  Please continue your pain medications.  Your knee x-ray did not show any fractures.  You met with social work and we are setting you up with some home services to help you manage your chronic conditions.  Please follow-up with your primary care doctor.  Return to the emergency department for any worsening or concerning symptoms 

## 2020-01-23 ENCOUNTER — Encounter (HOSPITAL_COMMUNITY): Payer: Self-pay | Admitting: Internal Medicine

## 2020-01-23 ENCOUNTER — Ambulatory Visit: Payer: HMO

## 2020-01-23 DIAGNOSIS — N186 End stage renal disease: Secondary | ICD-10-CM

## 2020-01-23 DIAGNOSIS — G4733 Obstructive sleep apnea (adult) (pediatric): Secondary | ICD-10-CM

## 2020-01-23 DIAGNOSIS — E1165 Type 2 diabetes mellitus with hyperglycemia: Secondary | ICD-10-CM

## 2020-01-23 DIAGNOSIS — Z794 Long term (current) use of insulin: Secondary | ICD-10-CM

## 2020-01-23 DIAGNOSIS — I10 Essential (primary) hypertension: Secondary | ICD-10-CM | POA: Diagnosis not present

## 2020-01-23 DIAGNOSIS — M25511 Pain in right shoulder: Secondary | ICD-10-CM | POA: Diagnosis not present

## 2020-01-23 DIAGNOSIS — I890 Lymphedema, not elsewhere classified: Secondary | ICD-10-CM

## 2020-01-23 DIAGNOSIS — L89202 Pressure ulcer of unspecified hip, stage 2: Secondary | ICD-10-CM

## 2020-01-23 DIAGNOSIS — M25561 Pain in right knee: Secondary | ICD-10-CM

## 2020-01-23 DIAGNOSIS — Z992 Dependence on renal dialysis: Secondary | ICD-10-CM

## 2020-01-23 DIAGNOSIS — M25512 Pain in left shoulder: Secondary | ICD-10-CM

## 2020-01-23 LAB — CBC WITH DIFFERENTIAL/PLATELET
Abs Immature Granulocytes: 0.11 10*3/uL — ABNORMAL HIGH (ref 0.00–0.07)
Basophils Absolute: 0.1 10*3/uL (ref 0.0–0.1)
Basophils Relative: 0 %
Eosinophils Absolute: 0.2 10*3/uL (ref 0.0–0.5)
Eosinophils Relative: 2 %
HCT: 34.6 % — ABNORMAL LOW (ref 36.0–46.0)
Hemoglobin: 10.7 g/dL — ABNORMAL LOW (ref 12.0–15.0)
Immature Granulocytes: 1 %
Lymphocytes Relative: 11 %
Lymphs Abs: 1.3 10*3/uL (ref 0.7–4.0)
MCH: 29.4 pg (ref 26.0–34.0)
MCHC: 30.9 g/dL (ref 30.0–36.0)
MCV: 95.1 fL (ref 80.0–100.0)
Monocytes Absolute: 1.2 10*3/uL — ABNORMAL HIGH (ref 0.1–1.0)
Monocytes Relative: 10 %
Neutro Abs: 9.5 10*3/uL — ABNORMAL HIGH (ref 1.7–7.7)
Neutrophils Relative %: 76 %
Platelets: 251 10*3/uL (ref 150–400)
RBC: 3.64 MIL/uL — ABNORMAL LOW (ref 3.87–5.11)
RDW: 18.6 % — ABNORMAL HIGH (ref 11.5–15.5)
WBC: 12.4 10*3/uL — ABNORMAL HIGH (ref 4.0–10.5)
nRBC: 0 % (ref 0.0–0.2)

## 2020-01-23 LAB — GLUCOSE, CAPILLARY
Glucose-Capillary: 111 mg/dL — ABNORMAL HIGH (ref 70–99)
Glucose-Capillary: 127 mg/dL — ABNORMAL HIGH (ref 70–99)
Glucose-Capillary: 117 mg/dL — ABNORMAL HIGH (ref 70–99)
Glucose-Capillary: 142 mg/dL — ABNORMAL HIGH (ref 70–99)

## 2020-01-23 LAB — BASIC METABOLIC PANEL
Anion gap: 13 (ref 5–15)
BUN: 97 mg/dL — ABNORMAL HIGH (ref 8–23)
CO2: 26 mmol/L (ref 22–32)
Calcium: 7.7 mg/dL — ABNORMAL LOW (ref 8.9–10.3)
Chloride: 103 mmol/L (ref 98–111)
Creatinine, Ser: 5.27 mg/dL — ABNORMAL HIGH (ref 0.44–1.00)
GFR calc Af Amer: 9 mL/min — ABNORMAL LOW (ref >60)
GFR calc non Af Amer: 7 mL/min — ABNORMAL LOW (ref >60)
Glucose, Bld: 128 mg/dL — ABNORMAL HIGH (ref 70–99)
Potassium: 4.8 mmol/L (ref 3.5–5.1)
Sodium: 142 mmol/L (ref 135–145)

## 2020-01-23 LAB — SARS CORONAVIRUS 2 BY RT PCR (DIASORIN): SARS Coronavirus 2: NEGATIVE

## 2020-01-23 LAB — CBG MONITORING, ED: Glucose-Capillary: 113 mg/dL — ABNORMAL HIGH (ref 70–99)

## 2020-01-23 LAB — TSH: TSH: 2.682 u[IU]/mL (ref 0.350–4.500)

## 2020-01-23 LAB — C-REACTIVE PROTEIN: CRP: 5.4 mg/dL — ABNORMAL HIGH (ref <1.0)

## 2020-01-23 LAB — HEMOGLOBIN A1C
Hgb A1c MFr Bld: 6.3 % — ABNORMAL HIGH (ref 4.8–5.6)
Mean Plasma Glucose: 134.11 mg/dL

## 2020-01-23 MED ORDER — ONDANSETRON HCL 4 MG PO TABS: 4.0000 mg | ORAL_TABLET | Freq: Four times a day (QID) | ORAL | Status: DC | PRN

## 2020-01-23 MED ORDER — ALPRAZOLAM 0.25 MG PO TABS: 0.2500 mg | ORAL_TABLET | Freq: Every day | ORAL | Status: DC | PRN

## 2020-01-23 MED ORDER — OXYCODONE-ACETAMINOPHEN 5-325 MG PO TABS
2.0000 | ORAL_TABLET | ORAL | Status: DC | PRN
  Administered 2020-01-23 – 2020-01-25 (×3): 2 via ORAL
  Filled 2020-01-23 (×3): qty 2

## 2020-01-23 MED ORDER — OXYCODONE-ACETAMINOPHEN 5-325 MG PO TABS
1.0000 | ORAL_TABLET | ORAL | Status: DC | PRN
  Administered 2020-01-23 – 2020-01-25 (×2): 1 via ORAL
  Filled 2020-01-23 (×2): qty 1

## 2020-01-23 MED ORDER — COLCHICINE 0.6 MG PO TABS
0.6000 mg | ORAL_TABLET | Freq: Once | ORAL | Status: AC
  Administered 2020-01-23: 0.6 mg via ORAL
  Filled 2020-01-23: qty 1

## 2020-01-23 MED ORDER — POLYETHYLENE GLYCOL 3350 17 G PO PACK: 17.0000 g | PACK | Freq: Every day | ORAL | Status: DC | PRN

## 2020-01-23 MED ORDER — ONDANSETRON HCL 4 MG/2ML IJ SOLN: 4.0000 mg | Freq: Four times a day (QID) | INTRAMUSCULAR | Status: DC | PRN

## 2020-01-23 MED ORDER — COLCHICINE 0.6 MG PO TABS
1.2000 mg | ORAL_TABLET | Freq: Once | ORAL | Status: AC
  Administered 2020-01-23: 1.2 mg via ORAL
  Filled 2020-01-23: qty 2

## 2020-01-23 MED ORDER — CHLORHEXIDINE GLUCONATE CLOTH 2 % EX PADS
6.0000 | MEDICATED_PAD | Freq: Every day | CUTANEOUS | Status: DC
  Administered 2020-01-23 – 2020-01-25 (×3): 6 via TOPICAL

## 2020-01-23 MED ORDER — DOXERCALCIFEROL 4 MCG/2ML IV SOLN
2.0000 ug | INTRAVENOUS | Status: DC
  Administered 2020-01-24: 2 ug via INTRAVENOUS
  Filled 2020-01-23 (×2): qty 2

## 2020-01-23 MED ORDER — INSULIN ASPART 100 UNIT/ML ~~LOC~~ SOLN
0.0000 [IU] | Freq: Three times a day (TID) | SUBCUTANEOUS | Status: DC
  Administered 2020-01-23 – 2020-01-25 (×4): 1 [IU] via SUBCUTANEOUS

## 2020-01-23 NOTE — Progress Notes (Signed)
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  Over the past several months, she has been experiencing progressive bilateral shoulder pain, worse with movement.  She also endorses injury to right knee while utilizing her motorized wheelchair.  Recently evaluated by PCP and was treated with a course of prednisone which did not change or alleviate any of her symptoms.  Due to her continued severe pain, she has been unable to mobilize resulting in development of wounds on her posterior thighs.  Upon evaluation in the ED, x-rays of bilateral shoulders as well as right knee were performed.  Notable was a mildly displaced fracture involving osteophyte arising from the inferior aspect of the glenoid fossa on the left shoulder, this was discussed with orthopedic surgery on-call who recommended no intervention.  Given patient's inability to mobilize, living alone, very unsafe discharge plan and patient was admitted to the hospital service for full therapy evaluation with likely need of SNF placement.   Assessment & Plan:   Active Problems:   Essential hypertension   OSA (obstructive sleep apnea)   Chronic acquired lymphedema   ESRD on hemodialysis (HCC)   Pressure injury of skin   Bilateral shoulder pain   Right knee pain   Uncontrolled type 2 diabetes mellitus with hyperglycemia, with long-term current use of insulin (HCC)   Bilateral shoulder pain Right knee pain Patient presenting with progressive shoulder and knee pain.  Has progressed to the point where she is unable to perform ADLs at home and has  subsequently missed 2 HD sessions.  Right knee x-ray with no acute findings, status post total knee arthroplasty.  Right shoulder with no acute findings.  Left shoulder x-ray with mildly displaced fracture inferior aspect of glenoid fossa.  X-ray findings were discussed with orthopedics on-call Silvestre Gunner who recommended medical management and no surgical intervention indicated at this time.  Patient seen by PT, recommend SNF placement. --Social work for [employer] placement --Pending OT evaluation --Control with Percocet q4h prn  ESRD on HD Patient undergoes dialysis at the [facility].  Has apparently missed the last 2 HD sessions due to her decreased mobility. --Nephrology following, appreciate assistance --Continue HD while inpatient  Type 2 diabetes mellitus Hemoglobin A1c 6.3, well controlled. --Continue NPH 50 units [place] BID --Insulin/scale for further coverage --CBGs qAC/HS  OSA --Continue nocturnal CPAP  HLD: Continue pravastatin 40 mg p.o. daily  GERD: Continue PPI  RLS: Continue ropinirole 8 mg p.o. nightly  Hx left popliteal DVT --Continue Eliquis 2.5 mg twice daily  Pressure injury of skin, present on admission Right/left posterior thigh pressure ulcers, stage II --Wound care following, appreciate assistance --Continue offloading, local wound care   Pressure Injury 01/22/20 Thigh Right;Left;Posterior Stage 2 -  Partial thickness loss of dermis presenting as a shallow open injury with a red, pink wound bed without slough. two large pressure/friction wounds noted to the posterior right and left thighs (Active)  01/22/20 1848  Location: Thigh  Location Orientation: Right;Left;Posterior  Staging: Stage 2 -  Partial thickness loss of dermis presenting as a shallow open injury with a red, pink wound bed without  slough.  Wound Description (Comments): two large pressure/friction wounds noted to the posterior right and left thighs.   Present on Admission: Yes     DVT  prophylaxis: Eliquis Code Status: Full code Family Communication: Discussed with patient extensively at bedside  Disposition Plan:  Status is: Observation  The patient remains OBS appropriate and will d/c before 2 midnights.  Dispo: The patient is from: Home              Anticipated d/c is to: SNF              Anticipated d/c date is: 1 day              Patient currently is medically stable to d/c.   Consultants:   Nephrology  Procedures:   None  Antimicrobials:   None   Subjective: Patient seen and examined bedside, continues to complain of generalized shoulder and knee pain.  Seen by PT this morning with recommendation of SNF placement.  Apparently has 3 bed offers per social work.  States has missed hemodialysis last 2 appointments due to inability to mobilize at home.  She currently lives alone with poor social support and is unsafe discharge back home even with home health care at this time.  No other complaints or concerns at this time.  Denies headache, no chest pain, palpitations, no shortness of breath, no abdominal pain.  No acute events overnight per nursing staff.  Objective: Vitals:   01/23/20 0430 01/23/20 0515 01/23/20 1001 01/23/20 1334  BP: 129/65 (!) 120/56 97/65 130/60  Pulse: 86 82 73 80  Resp: 18 17 20 20   Temp: 97.9 F (36.6 C) 98.1 F (36.7 C) 98.2 F (36.8 C) 97.8 F (36.6 C)  TempSrc: Oral Oral Oral Oral  SpO2: 100% 98% 94% 96%    Intake/Output Summary (Last 24 hours) at 01/23/2020 1527 Last data filed at 01/23/2020 1300 Gross per 24 hour  Intake 480 ml  Output 600 ml  Net -120 ml   There were no vitals filed for this visit.  Examination:  General exam: Appears calm and comfortable, obese Respiratory system: Clear to auscultation. Respiratory effort normal.  Oxygenating well on room air Cardiovascular system: S1 & S2 heard, RRR. No JVD, murmurs, rubs, gallops or clicks. No pedal edema. Gastrointestinal system: Abdomen is nondistended,  soft and nontender. No organomegaly or masses felt. Normal bowel sounds heard. Central nervous system: Alert and oriented. No focal neurological deficits. Extremities: Symmetric 5 x 5 power. Skin: No rashes, lesions or ulcers Psychiatry: Judgement and insight appear poor. Mood & affect appropriate.     Data Reviewed: I have personally reviewed following labs and imaging studies  CBC: Recent Labs  Lab 01/22/20 1303 01/23/20 0145  WBC 11.8* 12.4*  NEUTROABS  --  9.5*  HGB 10.6* 10.7*  HCT 35.2* 34.6*  MCV 96.2 95.1  PLT 272 324   Basic Metabolic Panel: Recent Labs  Lab 01/22/20 1303 01/23/20 0145  NA 143 142  K 4.5 4.8  CL 103 103  CO2 28 26  GLUCOSE 174* 128*  BUN 98* 97*  CREATININE 5.39* 5.27*  CALCIUM 8.2* 7.7*   GFR: CrCl cannot be calculated (Unknown ideal weight.). Liver Function Tests: No results for input(s): AST, ALT, ALKPHOS, BILITOT, PROT, ALBUMIN in the last 168 hours. No results for input(s): LIPASE, AMYLASE in the last 168 hours. No results for input(s): AMMONIA in the last 168 hours. Coagulation Profile: No results for input(s): INR, PROTIME in the last  168 hours. Cardiac Enzymes: No results for input(s): CKTOTAL, CKMB, CKMBINDEX, TROPONINI in the last 168 hours. BNP (last 3 results) No results for input(s): PROBNP in the last 8760 hours. HbA1C: Recent Labs    01/22/20 1303  HGBA1C 6.3*   CBG: Recent Labs  Lab 01/23/20 0106 01/23/20 0509 01/23/20 0747 01/23/20 1142  GLUCAP 113* 111* 127* 117*   Lipid Profile: No results for input(s): CHOL, HDL, LDLCALC, TRIG, CHOLHDL, LDLDIRECT in the last 72 hours. Thyroid Function Tests: Recent Labs    01/23/20 0105  TSH 2.682   Anemia Panel: No results for input(s): VITAMINB12, FOLATE, FERRITIN, TIBC, IRON, RETICCTPCT in the last 72 hours. Sepsis Labs: No results for input(s): PROCALCITON, LATICACIDVEN in the last 168 hours.  Recent Results (from the past 240 hour(s))  SARS Coronavirus 2 by  RT PCR     Status: None   Collection Time: 01/23/20  1:40 AM  Result Value Ref Range Status   SARS Coronavirus 2 NEGATIVE NEGATIVE Final    Comment: (NOTE) Result indicates the ABSENCE of SARS-CoV-2 RNA in the patient specimen.  The lowest concentration of SARS-CoV-2 viral copies this assay can detect in nasopharyngeal swab specimens is 500 copies / mL.  A negative result does not preclude SARS-CoV-2 infection and should not be used as the sole basis for patient management decisions. A negative result may occur with improper specimen collection / handling, submission of a specimen other than nasopharyngeal swab, presence of viral mutation(s) within the areas targeted by this assay, and inadequate number of viral copies (<500 copies / mL) present.  Negative results must be combined with clinical observations, patient history, and epidemiological information.  The expected result is NEGATIVE.  Patient Fact Sheet:  BlogSelections.co.uk   Provider Fact Sheet:  https://lucas.com/   This test is not yet approved or cleared by the [place] FDA and  has been authorized for  detection and/or diagnosis of SARS-CoV-2 by FDA under an Emergency Use Authorization (EUA).  This EUA will remain in effect (meaning this test can be used) for the duration of  the COVID-19 declaration under Section 564(b)(1) of the Act, 21 U.S.C. section 360bbb-3(b)(1), unless the authorization is terminated or revoked sooner Performed at [facility], [place] [address] [place], [place] [address]          Radiology Studies: DG Shoulder Right  Result Date: 01/22/2020 CLINICAL DATA:  Acute right shoulder pain without known injury. EXAM: RIGHT SHOULDER - 2+ VIEW COMPARISON:  None. FINDINGS: There is no evidence of fracture or dislocation. No joint space narrowing is noted. Osteophyte formation is seen involving the glenoid fossa. Soft tissues are unremarkable.  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 No acute abnormality seen in the right shoulder. Electronically Signed   By: Marijo Conception M.D.   On: 01/22/2020 16:12   DG Shoulder Left  Result Date: 01/22/2020 CLINICAL DATA:  Acute left shoulder pain without known injury. EXAM: LEFT SHOULDER - 2+ VIEW COMPARISON:  April 01, 2017. FINDINGS: Possible mildly displaced fracture is seen involving osteophyte arising from the inferior aspect of the glenoid fossa. Humerus and clavicle are unremarkable. There is no evidence of arthropathy or other focal bone abnormality. Soft tissues are unremarkable. IMPRESSION: Possible mildly displaced fracture involving osteophyte arising from inferior aspect of glenoid fossa. Electronically Signed   By: Marijo Conception M.D.   On: 01/22/2020 16:10   DG Knee Complete 4 Views Right  Result Date: 01/22/2020 CLINICAL DATA:  Right knee pain after injury 2 days ago. EXAM: RIGHT KNEE - COMPLETE 4+ VIEW COMPARISON:  None. FINDINGS: Status post right total knee arthroplasty. The right femoral and tibial components appear to be well situated. No fracture or dislocation is noted. No joint effusion is noted. IMPRESSION: Status post right total knee arthroplasty. No acute abnormality seen in the right knee. Electronically Signed   By: Marijo Conception M.D.   On: 01/22/2020 16:07        Scheduled Meds: . apixaban  2.5 mg Oral BID  . calcium acetate  667 mg Oral TID with meals  . carbidopa-levodopa  1 tablet Oral TID  . Chlorhexidine Gluconate Cloth  6 each Topical Q0600  . cyclobenzaprine  10 mg Oral QHS  . doxercalciferol  2 mcg Intravenous Q M,W,F-HD  . febuxostat  40 mg Oral Daily  . gabapentin  300 mg Oral QHS  . insulin aspart  0-9 Units Subcutaneous TID AC & HS  . insulin NPH Human  50 Units Subcutaneous BID AC & HS  . pantoprazole  40 mg Oral Daily  . pravastatin  40 mg Oral q1800  . rOPINIRole  8 mg Oral QHS   Continuous Infusions:    LOS: 0 days    Time spent: 38 minutes spent on chart review, discussion with nursing staff, consultants, updating family and interview/physical exam; more than 50% of that time was spent in counseling and/or coordination of care.    Hyacinth Marcelli J [place] DO [facility] Available via Epic secure chat 7am-7pm After these hours, please refer to coverage provider listed on [email or web address] 01/23/2020, 3:27 PM

## 2020-01-23 NOTE — TOC Progression Note (Signed)
Transition of Care [facility]) - Progression Note    Patient Details  Name: Jean Mcclain MRN: [id number] Date of Birth: 08-Jan-1946  Transition of Care [facility]) CM/SW [place], [place] Phone Number: 01/23/2020, 10:29 AM  Clinical Narrative:    CSW received a message from [name] with [facility] regarding pt disposition for SNF. CSW acknowledging w/u done in ED last night. CSW went and spoke with pt at bedside. Introduced self, role, reason for visit. Pt shared she is originally from the [place] but after living in [place] she and her then husband moved to [place]. Pt lives in [place] alone. Her brother [name] also lives in [place]. She has found it progressively more difficult to mobilize and take care of herself to do things such as getting to dialysis. She goes to MWF dialysis at [facility] but is unsure what her chair time is only saying in the "morning/afternoon time." CSW explained referral process and that her offers may be dependent on the ability of the SNF to get her to her dialysis appointments. Pt understands I will bring offers for her and her brother to consider.   TOC team has also been in contact with renal navigator [name] to coordinate with outpatient center as needed.    Expected Discharge Plan: [place] Barriers to Discharge: No Barriers Identified  Expected Discharge Plan and Services Expected Discharge Plan: [place] Choice: [place] arrangements for the past 2 months: Single Family Home                  Readmission Risk Interventions No flowsheet data found.

## 2020-01-23 NOTE — TOC Progression Note (Signed)
Transition of Care [facility]) - Progression Note    Patient Details  Name: PRANATHI WINFREE MRN: [id number] Date of Birth: October 10, 1946  Transition of Care [facility]) CM/SW [place], [place] Phone Number: 01/23/2020, 2:27 PM  Clinical Narrative:    CSW received call with initial approval pending facility choice from [employer]. CSW went by room to provide pt with offers and she was off the floor at dialysis. Pt has three SNF offers at this time.    Expected Discharge Plan: [place] Barriers to Discharge: No Barriers Identified  Expected Discharge Plan and Services Expected Discharge Plan: [place] Choice: [place] arrangements for the past 2 months: Single Family Home  Readmission Risk Interventions No flowsheet data found.

## 2020-01-23 NOTE — Plan of Care (Signed)

## 2020-01-23 NOTE — H&P (Signed)
History and Physical    BRONDA ALFRED DDU:[id number] DOB: 11-30-1945 DOA: 01/22/2020  PCP: Leanna Battles, MD  Patient coming from: Home   Chief Complaint:  Chief Complaint  Patient presents with  . Back Pain  . 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  Patient explains that for the past several months she has been experiencing progressively worsening bilateral shoulder pain.  Pain is severe in intensity, sore in quality, worse with movement of bilateral upper extremities, radiating into the bilateral hands.  In addition to this bilateral shoulder pain, patient displays 3 days ago while she was sitting on her motorized wheelchair she accidentally ran into the wall injuring her right knee.  Right knee pain is also 10 out of 10 intensity, sharp in quality, nonradiating and worse with movement of the right lower extremity.  Patient explains that she was recently evaluated by her primary care provider for these complaints of pain and was treated with a course of prednisone which did not alleviate her symptoms.  Because of patient's continued severe pain she has not been able to get out of her motorized wheelchair, resulting in development of wounds on her posterior thighs.    Patient eventually presented to [facility] emergency department for evaluation due to these persisting symptoms.  Upon evaluation in the emergency department when x-rays of the bilateral shoulders as well as the right knee.  This only revealed a mildly displaced fracture involving osteophyte arising from inferior aspect of the glenoid fossa of the left shoulder.  This was discussed with  orthopedic surgery who recommended no intervention.  Attempts were made to discharge patient home but due to patient and family voicing concerns as to safety of being discharged home alone hospitalist group has been called to assess the patient for mission the hospital.  Review of Systems: A 10-system review of systems has been performed and all systems are negative with the exception of what is listed in the HPI.    Past Medical History:  Diagnosis Date  . Anxiety   . Arthritis    osteo  . Chronic kidney disease   . Diabetes mellitus, type 2 (HCC)    Type II  . DVT (deep venous thrombosis) ([place]) 03/2017   left popliteal  . Facet joint disease   . Facet joint disease    foot  . Fibula fracture 02/2017   left  . GERD (gastroesophageal reflux disease)   . Hyperlipidemia   . Hypertension   . OSA (obstructive sleep apnea)    does not use CPAP   . Osteopenia   . Shortness of breath    with exertion  . Venous stasis ulcers ([place])   . Vitamin D deficiency     Past Surgical History:  Procedure Laterality Date  . AV FISTULA PLACEMENT Right 08/20/2018   Procedure: ARTERIOVENOUS (AV) FISTULA CREATION BRACHIOCEPHALIC;  Surgeon: Angelia Mould, MD;  Location: [place];  Service: Vascular;  Laterality: Right;  . BREAST SURGERY Right 03/1997   biospy  . CARPAL TUNNEL RELEASE Left 2016  . INSERTION OF MESH N/A 01/08/2013   Procedure: INSERTION OF MESH;  Surgeon: Earnstine Regal, MD;  Location: [facility];  Service: General;  Laterality: N/A;  .  IR THORACENTESIS ASP PLEURAL SPACE W/IMG GUIDE  04/06/2017  . JOINT REPLACEMENT Right 1999  . TOTAL KNEE ARTHROPLASTY Left 1999   Left  . TRIGGER FINGER RELEASE Left 11/2014  . TRIGGER FINGER RELEASE Right   . VENTRAL HERNIA REPAIR N/A 01/08/2013   Procedure: LAPAROSCOPIC VENTRAL HERNIA;  Surgeon: Earnstine Regal, MD;  Location: [facility];  Service: General;  Laterality: N/A;     reports that she quit smoking about 38 years ago. Her smoking use included  cigarettes. She quit after 27.00 years of use. She has never used smokeless tobacco. She reports that she does not drink alcohol or use drugs.  No Known Allergies  Family History  Problem Relation Age of Onset  . Leukemia Mother   . Hypertension Brother   . Emphysema Father      Prior to Admission medications   Medication Sig Start Date End Date Taking? Authorizing Provider  acetaminophen (TYLENOL) 500 MG tablet Take 1,000 mg by mouth 3 (three) times daily as needed for moderate pain or headache.    [provider]  ALPRAZolam Duanne Moron) 0.25 MG tablet Take before the beginning of HD treatment , 1 tab Patient taking differently: Take 0.25 mg by mouth See admin instructions. Take before the beginning of HD treatment , 1 tab 11/20/19   Dohmeier, Asencion Partridge, MD  apixaban (ELIQUIS) 2.5 MG TABS tablet Take 2.5 mg by mouth 2 (two) times daily.    [provider]  B-D INS SYR ULTRAFINE 1CC/30G 30G X 1/2" 1 ML MISC 1 each by Other route daily.  12/23/19   [provider]  calcium acetate (PHOSLO) 667 MG tablet Take 667 mg by mouth 3 (three) times daily. 11/28/19   [provider]  carbidopa-levodopa (SINEMET IR) 10-100 MG tablet Take 1 tablet by mouth 3 (three) times daily.  11/23/19   [provider]  colchicine 0.6 MG tablet Take 0.6 mg by mouth daily as needed (gout flare).  02/21/17   [provider]  cyclobenzaprine (FLEXERIL) 10 MG tablet Take 10 mg by mouth at bedtime.     [provider]  febuxostat (ULORIC) 40 MG tablet Take 40 mg by mouth daily.    [provider]  gabapentin (NEURONTIN) 300 MG capsule Take 300 mg by mouth at bedtime. 11/14/19   [provider]  glucose blood test strip 1 each by Other route daily.     [provider]  hydrOXYzine (ATARAX/VISTARIL) 10 MG tablet Take 1 tablet (10 mg total) by mouth 2 (two) times daily as needed. Patient taking differently: Take 10 mg by mouth 2 (two) times daily  as needed for itching or anxiety.  11/20/19   Dohmeier, Asencion Partridge, MD  insulin NPH Human (HUMULIN N,NOVOLIN N) 100 UNIT/ML injection Inject 50 Units into the skin 2 (two) times daily.     [provider]  insulin regular (NOVOLIN R,HUMULIN R) 100 units/mL injection Inject 10-30 Units into the skin 3 (three) times daily before meals.     [provider]  Lancets [facility] DELICA PLUS [id number]) [place] 1 each by Other route daily.  03/05/19   [provider]  lidocaine (LIDODERM) 5 % Place 1 patch onto the skin daily. Remove & Discard patch within 12 hours or as directed by MD 12/11/18   Landis Martins, DPM  lidocaine-prilocaine (EMLA) cream Apply 1 application topically See admin instructions. 1 hour before dialysis to port 09/28/19   [provider]  lovastatin (MEVACOR) 40 MG tablet Take 40  mg by mouth at bedtime.     [provider]  multivitamin (RENA-VIT) TABS tablet Take 1 tablet by mouth daily. 10/09/19   [provider]  omeprazole (PRILOSEC) 20 MG capsule Take 20 mg by mouth daily.    [provider]  rOPINIRole (REQUIP XL) 8 MG 24 hr tablet Take 1 tablet (8 mg total) by mouth at bedtime. 11/20/19   Dohmeier, Asencion Partridge, MD  triamcinolone cream (KENALOG) 0.1 % Apply 1 application topically 2 (two) times daily as needed (itching).  06/20/19   [provider]  Tuberculin PPD (TUBERSOL ID) Inject into the skin. 12/07/19   [provider]    Physical Exam: Vitals:   01/22/20 1257 01/22/20 1952  BP: (!) 180/82 (!) 141/78  Pulse: 98 89  Resp: 18 (!) 22  Temp: 97.7 F (36.5 C) 98 F (36.7 C)  TempSrc: Oral Oral  SpO2: 94% 93%    Constitutional: Acute alert and oriented x3, patient is in distress due to pain.  Patient is obese. Skin: Significant reddish discoloration of the anterior left leg.  Significant dry skin of the distal bilateral lower extremities with significant flaking of the skin.  Notable stage II pressure  wounds of the posterior thighs each measuring approximately 5 to 6 cm in diameter.  Eyes: Pupils are equally reactive to light.  Injected conjunctive are noted.  ENMT: Mucous membranes are moist. Posterior pharynx clear of any exudate or lesions. Normal dentition.   Neck: normal, supple, no masses, no thyromegaly Respiratory: clear to auscultation bilaterally, no wheezing no org, no crackles. Normal respiratory effort. No accessory muscle use.  Cardiovascular: Regular rate and rhythm, no murmurs / rubs / gallops. No extremity edema. 2+ pedal pulses. No carotid bruits.  Back:   Nontender without crepitus or deformity. Abdomen: Abdomen is protuberant but soft and nontender.  No evidence of intra-abdominal masses.  Positive bowel sounds noted in all quadrants.   Musculoskeletal: Significant pain with both passive and active range of motion of the bilateral shoulders without evidence of crepitus or deformity.  No evidence of deformities.  Assessment of right knee reveals no evidence of effusion.  Patient does complain of significant pain of the right knee with both passive and active range of motion.  Notable deformity of the left ankle joint.  Significant edema of the left lower extremity which seems to be chronic. Neurologic: CN 2-12 grossly intact. Sensation intact, patient is able to move all 4 extremities spontaneously.  Patient is following all commands.  Patient is responsive to verbal stimuli.   Psychiatric: Anxious mood with labile affect.  Patient seems to possess insight as to theircurrent situation.     Labs on Admission: I have personally reviewed following labs and imaging studies -   CBC: Recent Labs  Lab 01/22/20 1303  WBC 11.8*  HGB 10.6*  HCT 35.2*  MCV 96.2  PLT 235   Basic Metabolic Panel: Recent Labs  Lab 01/22/20 1303  NA 143  K 4.5  CL 103  CO2 28  GLUCOSE 174*  BUN 98*  CREATININE 5.39*  CALCIUM 8.2*   GFR: CrCl cannot be calculated (Unknown ideal  weight.). Liver Function Tests: No results for input(s): AST, ALT, ALKPHOS, BILITOT, PROT, ALBUMIN in the last 168 hours. No results for input(s): LIPASE, AMYLASE in the last 168 hours. No results for input(s): AMMONIA in the last 168 hours. Coagulation Profile: No results for input(s): INR, PROTIME in the last 168 hours. Cardiac Enzymes: No results for input(s): CKTOTAL,  CKMB, CKMBINDEX, TROPONINI in the last 168 hours. BNP (last 3 results) No results for input(s): PROBNP in the last 8760 hours. HbA1C: No results for input(s): HGBA1C in the last 72 hours. CBG: Recent Labs  Lab 01/23/20 0106  GLUCAP 113*   Lipid Profile: No results for input(s): CHOL, HDL, LDLCALC, TRIG, CHOLHDL, LDLDIRECT in the last 72 hours. Thyroid Function Tests: No results for input(s): TSH, T4TOTAL, FREET4, T3FREE, THYROIDAB in the last 72 hours. Anemia Panel: No results for input(s): VITAMINB12, FOLATE, FERRITIN, TIBC, IRON, RETICCTPCT in the last 72 hours. Urine analysis:    Component Value Date/Time   COLORURINE YELLOW 03/30/2017 1718   APPEARANCEUR CLOUDY (A) 03/30/2017 1718   LABSPEC >1.030 (H) 03/30/2017 1718   PHURINE 6.0 03/30/2017 1718   GLUCOSEU NEGATIVE 03/30/2017 1718   HGBUR LARGE (A) 03/30/2017 1718   BILIRUBINUR NEGATIVE 03/30/2017 1718   KETONESUR NEGATIVE 03/30/2017 1718   PROTEINUR >300 (A) 03/30/2017 1718   UROBILINOGEN 0.2 10/01/2009 1740   NITRITE POSITIVE (A) 03/30/2017 1718   LEUKOCYTESUR MODERATE (A) 03/30/2017 1718    Radiological Exams on Admission personally reviewed: DG Shoulder Right  Result Date: 01/22/2020 CLINICAL DATA:  Acute right shoulder pain without known injury. EXAM: RIGHT SHOULDER - 2+ VIEW COMPARISON:  None. FINDINGS: There is no evidence of fracture or dislocation. No joint space narrowing is noted. Osteophyte formation is seen involving the glenoid fossa. Soft tissues are unremarkable. IMPRESSION: Mild degenerative joint disease of right glenohumeral  joint. No acute abnormality seen in the right shoulder. Electronically Signed   By: Marijo Conception M.D.   On: 01/22/2020 16:12   DG Shoulder Left  Result Date: 01/22/2020 CLINICAL DATA:  Acute left shoulder pain without known injury. EXAM: LEFT SHOULDER - 2+ VIEW COMPARISON:  April 01, 2017. FINDINGS: Possible mildly displaced fracture is seen involving osteophyte arising from the inferior aspect of the glenoid fossa. Humerus and clavicle are unremarkable. There is no evidence of arthropathy or other focal bone abnormality. Soft tissues are unremarkable. IMPRESSION: Possible mildly displaced fracture involving osteophyte arising from inferior aspect of glenoid fossa. Electronically Signed   By: Marijo Conception M.D.   On: 01/22/2020 16:10   DG Knee Complete 4 Views Right  Result Date: 01/22/2020 CLINICAL DATA:  Right knee pain after injury 2 days ago. EXAM: RIGHT KNEE - COMPLETE 4+ VIEW COMPARISON:  None. FINDINGS: Status post right total knee arthroplasty. The right femoral and tibial components appear to be well situated. No fracture or dislocation is noted. No joint effusion is noted. IMPRESSION: Status post right total knee arthroplasty. No acute abnormality seen in the right knee. Electronically Signed   By: Marijo Conception M.D.   On: 01/22/2020 16:07    EKG: Personally reviewed.  Rhythm is sinus rhythm with heart rate of 84.  No dynamic ST segment changes appreciated.  Assessment/Plan Active Problems:   Bilateral shoulder pain   Severe pain of the bilateral shoulders is likely mostly chronic.  There is a mild possible nondisplaced fracture of the left shoulder with osteophyte involvement which was discussed with Mr. Gorden Harms with orthopedics who recommended no intervention and outpatient follow-up if needed.  Providing as needed opiate-based analgesics for substantial pain  Evaluation ordered  Outpatient follow-up with orthopedic surgery can be arranged at time of discharge.  Due to  polyarticular substantial pain and history of gout will attempt a trial of colchicine and monitor for symptomatic improvement  Due to patient voicing concerns that she is unsafe being  discharged home, will obtain case management consultation in the morning.    Right knee pain   Patient complains of substantial pain of the right knee without obvious abnormality on examination  Right knee x-ray is unremarkable  No clinical evidence of infectious process  Provide patient with as needed opiate-based analgesics  PT evaluation  Due to polyarticular substantial pain and history of gout will attempt a trial of colchicine and monitor for symptomatic improvement    ESRD on hemodialysis [facility])   Patient states that she has missed her last 2 cycles of hemodialysis due to substantial pain  Will contact nephrology in the morning to resume dialysis while hospitalized    Uncontrolled type 2 diabetes mellitus with hyperglycemia, with long-term current use of insulin (HCC)   Continue home regimen of insulin therapy  Hemoglobin A1c obtained  Accu-Cheks before every meal and nightly with sliding scale insulin    OSA (obstructive sleep apnea)   Supplemental oxygen, 2 L of oxygen via nasal cannula nightly    Pressure injury of skin   Due to patient being unable to move due to her substantial pain she seems to have developed what seems to be stage II pressure wounds on the posterior thighs  Applying Mepilex for now  Ordering wound care consultation    Code Status:  Full code Family Communication: Brother notified by emergency department staff Disposition Plan: Patient is anticipated to be discharged to home with home health services once patient has met maximum benefit from current hospitalization.   Consults called: Nephrology to be consulted in the morning for resumption of dialysis Admission status: Patient will be admitted to Observation and is anticipated to remain in the hospital  for less than 2 midnights.   Vernelle Emerald MD [facility] Pager [phone]  If 7PM-7AM, please contact night-coverage [email or web address] Use universal [place] password for that web site. If you do not have the password, please call the hospital operator.  01/23/2020, 1:08 AM

## 2020-01-23 NOTE — Consult Note (Addendum)
[facility] Nurse Consult Note: Reason for Consult:patient with partial thickness shearing and moisture associated skin damage to posterior thighs.  Was left unable to transfer from her motorized wheelchair due to intractable pain.  Seen at outpatient rehab for lymphedema management.  Wears removable compression garment but cannot tolerate compression at this time.  Both legs with generalized edema.  Wound type: Shearing force with transfers, moisture and pressure to posterior legs Pressure Injury POA: Yes Measurement: Left posterior thigh:  6 cm x 5.5 cm x 0.2 cm  Right posterior thigh:  4 cm x 5 cm x 0.1 cm  Wound YKZ:LDJT and moist, sloughing epithelium at edges Drainage (amount, consistency, odor) minimal serosanguinous weeping.  Periwound:intact Dressing procedure/placement/frequency: Cleanse wounds to posterior legs with soap and water and pat dry.  Apply Xeroform gauze to open wounds.  Cover with ABD pad and tape. Change daily and PRN soilage.  Will not follow at this time.  Please re-consult if needed.  Domenic Moras MSN, RN, FNP-BC [facility] Wound, Ostomy, Continence Nurse Pager [phone]

## 2020-01-23 NOTE — Evaluation (Signed)
Physical Therapy Evaluation Patient Details Name: Jean Mcclain MRN: [id number] DOB: 05-05-1946 Today's Date: 01/23/2020   History of Present Illness  74 yo female admitted from home on 4/14 with bilateral posterior thigh pressure injuries due to inability to transfer out of power chair, bilateral shoulder pain, and acute R knee pain due to running into wall with power chair. Xray L shoulder reveals mildly displaced fracture involving osteophyte from inferior aspect of glenoid fossa, ortho recommends conservative management. R knee xray negative for acute findings. PMH includes ESRD on HD, DM, HTN, GERD, RLS, L DVT, OSA, anxiety, HLD.  Clinical Impression   Pt presents with severe back and bilateral shoulder L>R pain, moderate to maximal difficulty performing bed mobility, fair sitting balance and tolerance, inability to transfer to standing even with max PT assist, and decreased activity tolerance. Pt to benefit from acute PT to address deficits. Pt required mod-max assist for bed mobility and attempted transfer training, pt unable to clear buttocks from bed with multiple attempts. Pt requires very increased time for all mobility, and PT spent extensive time positioning pt for pressure relief. PT recommending SNF level of care given physical and mobility deficits, as well as poor social support. PT to progress mobility as tolerated, and will continue to follow acutely.      Follow Up Recommendations SNF;Supervision/Assistance - 24 hour    Equipment Recommendations  Other (comment)(defer to next venue)    Recommendations for Other Services       Precautions / Restrictions Precautions Precautions: Fall Precaution Comments: pressure injuries, posterior thighs and gluteal region (unable for PT to observe, pt not able to roll all the way into side) Restrictions Weight Bearing Restrictions: No      Mobility  Bed Mobility Overal bed mobility: Needs Assistance Bed Mobility: Supine to  Sit;Sit to Supine;Rolling Rolling: Max assist   Supine to sit: Mod assist;HOB elevated Sit to supine: Total assist;HOB elevated   General bed mobility comments: Mod assist for supine to sit for trunk elevation via R HHA, LE assist fro lifting and translation to EOB, scooting to EOB with PT facilitating anterior rocking and L/R weightshifting. Total assist for return to supine for LE and truncal management, scooting pt up in bed with use of boost function and pads. Max assist for roll to R for pressure relief and pillow placing post-EOB sitting.  Transfers Overall transfer level: Needs assistance Equipment used: Rolling walker (2 wheeled) Transfers: Sit to/from Stand Sit to Stand: Max assist;From elevated surface         General transfer comment: Sit to stand attempts x3, pt unable to clear buttocks even with max PT lift assist. Pt resisting stand attempt at one point, states "I am too scared, I am scared to fall, I am sorry"  Ambulation/Gait             General Gait Details: unable this day  Stairs            Wheelchair Mobility    Modified Rankin (Stroke Patients Only)       Balance Overall balance assessment: Needs assistance;History of Falls Sitting-balance support: Bilateral upper extremity supported;Feet supported Sitting balance-Leahy Scale: Fair     Standing balance support: Bilateral upper extremity supported;During functional activity Standing balance-Leahy Scale: Zero Standing balance comment: unable to rise to stand this day                             Pertinent Vitals/Pain  Pain Assessment: 0-10 Pain Score: 5  Pain Location: shoulders Pain Descriptors / Indicators: Discomfort;Sore Pain Intervention(s): Limited activity within patient's tolerance;Monitored during session;Repositioned    Home Living Family/patient expects to be discharged to:: Private residence Living Arrangements: Alone Available Help at Discharge: Family;Available  PRN/intermittently(brother calls pt about 1x/week) Type of Home: House Home Access: Stairs to enter;Ramped entrance Entrance Stairs-Rails: Right;Left Entrance Stairs-Number of Steps: 5 Home Layout: One level Home Equipment: Walker - 2 wheels;Shower seat;Bedside commode;Wheelchair - power      Prior Function Level of Independence: Independent with assistive device(s)         Comments: Pt reports doing on ADLs for self PTA, states she was using power chair throughout house but must stand and walk into bathroom for toileting. Pt reports significant difficulty taking care of self at home, reports no falls but states last week she slid down power chair when reaching for something and EMS had to lift her back to chair. Uses 2 LO2 at night, sleeps in a lift chair     Hand Dominance   Dominant Hand: Right    Extremity/Trunk Assessment   Upper Extremity Assessment Upper Extremity Assessment: Generalized weakness;LUE deficits/detail LUE Deficits / Details: unable to elevate shoulder >90*, limited by pain and weakness    Lower Extremity Assessment Lower Extremity Assessment: Generalized weakness;RLE deficits/detail;LLE deficits/detail RLE Deficits / Details: partial AROM observed knee flex/ext, hip flex/ext, DF/PF, and hip abd/add. Limited due to pain and weakness RLE: Unable to fully assess due to pain LLE Deficits / Details: see above LLE: Unable to fully assess due to pain    Cervical / Trunk Assessment Cervical / Trunk Assessment: Other exceptions;Kyphotic Cervical / Trunk Exceptions: forward head, rounded shoulders  Communication   Communication: No difficulties  Cognition Arousal/Alertness: Awake/alert Behavior During Therapy: WFL for tasks assessed/performed Overall Cognitive Status: Within Functional Limits for tasks assessed                                        General Comments General comments (skin integrity, edema, etc.): LLE red and swollen, per pt  this is normal due to LE edema. Pt goes to OPPT for edema management.    Exercises     Assessment/Plan    PT Assessment Patient needs continued PT services  PT Problem List Decreased strength;Decreased mobility;Decreased safety awareness;Decreased activity tolerance;Decreased balance;Decreased knowledge of use of DME;Pain;Obesity;Decreased skin integrity       PT Treatment Interventions DME instruction;Therapeutic activities;Therapeutic exercise;Gait training;Patient/family education;Balance training;Functional mobility training;Neuromuscular re-education    PT Goals (Current goals can be found in the Care Plan section)  Acute Rehab PT Goals Patient Stated Goal: get up, stop hurting PT Goal Formulation: With patient Time For Goal Achievement: 02/06/20 Potential to Achieve Goals: Good    Frequency Min 2X/week   Barriers to discharge Decreased caregiver support      Co-evaluation               AM-PAC PT "6 Clicks" Mobility  Outcome Measure Help needed turning from your back to your side while in a flat bed without using bedrails?: A Lot Help needed moving from lying on your back to sitting on the side of a flat bed without using bedrails?: A Lot Help needed moving to and from a bed to a chair (including a wheelchair)?: Total Help needed standing up from a chair using your arms (e.g., wheelchair or bedside  chair)?: Total Help needed to walk in hospital room?: Total Help needed climbing 3-5 steps with a railing? : Total 6 Click Score: 8    End of Session Equipment Utilized During Treatment: Gait belt;Oxygen(on 2LO2 during session, removed during attempted transfer) Activity Tolerance: Patient limited by fatigue;Patient limited by pain Patient left: in bed;with call bell/phone within reach;with bed alarm set Nurse Communication: Mobility status PT Visit Diagnosis: Other abnormalities of gait and mobility (R26.89);Muscle weakness (generalized) (M62.81);Difficulty in  walking, not elsewhere classified (R26.2)    Time: 1000-1042 PT Time Calculation (min) (ACUTE ONLY): 42 min   Charges:   PT Evaluation $PT Eval Low Complexity: 1 Low PT Treatments $Therapeutic Activity: 23-37 mins        Micaela Stith E, PT Acute Rehabilitation Services Pager [phone]  Office [phone]   Raya Mckinstry D Travonta Gill 01/23/2020, 11:04 AM

## 2020-01-23 NOTE — Consult Note (Signed)
   [facility] CM Inpatient Consult   01/23/2020  Jean Mcclain [birth year] [id number]   Alert of the patient's admission from the [facility] staff.  Patient is currently active with [facility] for chronic disease management services in the [facility] program for management. Patient will receive a post hospital call and will be evaluated for assessments and disease process education.   Patient is currently in HD.  Plan: Reached out to Inpatient Transition Of Care [TOC] team member to make aware that [facility] following and patient is in the [facility] for Diabetes.  Patient is being considered for a skilled nursing facility stay. Will continue to follow.   Of note, [facility] Care Management services does not replace or interfere with any services that are needed or arranged by inpatient [facility] care management team.  For additional questions or referrals please contact:   Natividad Brood, RN BSN [facility] Liaison  [phone] business mobile phone Toll free office [phone]  Fax number: [phone] [name]

## 2020-01-23 NOTE — Consult Note (Addendum)
[facility] Renal Consultation Note    Indication for Consultation:  Management of ESRD/hemodialysis; anemia, hypertension/volume and secondary hyperparathyroidism PCP: Dr. Leanna Battles  HPI: Jean Mcclain is a 74 y.o. female with ESRD on hemodialysis MWF at [facility]. PMH: Obesity, HTN/DMT2, medical noncompliance, OSA, DVT, AOCD, SHPT. Last HD 01/17/2020.  She presented to ED this AM with C/O shoulder pain, pain in legs. Labs unremarkable, particularly since she missed an entire week of HD. Xray of left shoulder revealed possible mildly displaced fracture is seen involving osteophyte arising from the inferior aspect of the glenoid fossa. Primary discussed with orthopedics, no intervention needed. Attempted to DC patient home from ED but patient/family voice concerns about patient being discharged home alone as she says she has not been able to get out of motorized WC and now has wounds on her legs. She has been admitted as observation for shoulder pain. She has no signs of overt uremia. Will have HD today on schedule. MSW has been consulted, working on SNF placement.   Past Medical History:  Diagnosis Date  . Anxiety   . Arthritis    osteo  . Chronic kidney disease   . Diabetes mellitus, type 2 (HCC)    Type II  . DVT (deep venous thrombosis) ([place]) 03/2017   left popliteal  . Facet joint disease   . Facet joint disease    foot  . Fibula fracture 02/2017   left  . GERD (gastroesophageal reflux disease)   . Hyperlipidemia   . Hypertension   . OSA (obstructive sleep apnea)    does not use CPAP   . Osteopenia   . Shortness of breath    with exertion  . Venous stasis ulcers ([place])   . Vitamin D deficiency    Past Surgical History:  Procedure Laterality Date  . AV FISTULA PLACEMENT Right 08/20/2018   Procedure: ARTERIOVENOUS (AV) FISTULA CREATION BRACHIOCEPHALIC;  Surgeon: Angelia Mould, MD;  Location: [place] [facility];  Service: Vascular;   Laterality: Right;  . BREAST SURGERY Right 03/1997   biospy  . CARPAL TUNNEL RELEASE Left 2016  . INSERTION OF MESH N/A 01/08/2013   Procedure: INSERTION OF MESH;  Surgeon: Earnstine Regal, MD;  Location: [facility];  Service: General;  Laterality: N/A;  . IR THORACENTESIS ASP PLEURAL SPACE W/IMG GUIDE  04/06/2017  . JOINT REPLACEMENT Right 1999  . TOTAL KNEE ARTHROPLASTY Left 1999   Left  . TRIGGER FINGER RELEASE Left 11/2014  . TRIGGER FINGER RELEASE Right   . VENTRAL HERNIA REPAIR N/A 01/08/2013   Procedure: LAPAROSCOPIC VENTRAL HERNIA;  Surgeon: Earnstine Regal, MD;  Location: [facility];  Service: General;  Laterality: N/A;   Family History  Problem Relation Age of Onset  . Leukemia Mother   . Hypertension Brother   . Emphysema Father    Social History:  reports that she quit smoking about 38 years ago. Her smoking use included cigarettes. She quit after 27.00 years of use. She has never used smokeless tobacco. She reports that she does not drink alcohol or use drugs. No Known Allergies Prior to Admission medications   Medication Sig Start Date End Date Taking? Authorizing Provider  acetaminophen (TYLENOL) 500 MG tablet Take 1,000 mg by mouth 3 (three) times daily as needed for moderate pain or headache.    [provider]  ALPRAZolam Duanne Moron) 0.25 MG tablet Take before the beginning of HD treatment , 1 tab Patient taking differently: Take 0.25 mg by  mouth See admin instructions. Take before the beginning of HD treatment , 1 tab 11/20/19   Dohmeier, Asencion Partridge, MD  apixaban (ELIQUIS) 2.5 MG TABS tablet Take 2.5 mg by mouth 2 (two) times daily.    [provider]  B-D INS SYR ULTRAFINE 1CC/30G 30G X 1/2" 1 ML MISC 1 each by Other route daily.  12/23/19   [provider]  calcium acetate (PHOSLO) 667 MG tablet Take 667 mg by mouth 3 (three) times daily. 11/28/19   [provider]  carbidopa-levodopa (SINEMET IR) 10-100 MG tablet Take 1 tablet by mouth 3 (three) times  daily.  11/23/19   [provider]  colchicine 0.6 MG tablet Take 0.6 mg by mouth daily as needed (gout flare).  02/21/17   [provider]  cyclobenzaprine (FLEXERIL) 10 MG tablet Take 10 mg by mouth at bedtime.     [provider]  febuxostat (ULORIC) 40 MG tablet Take 40 mg by mouth daily.    [provider]  gabapentin (NEURONTIN) 300 MG capsule Take 300 mg by mouth at bedtime. 11/14/19   [provider]  glucose blood test strip 1 each by Other route daily.     [provider]  hydrOXYzine (ATARAX/VISTARIL) 10 MG tablet Take 1 tablet (10 mg total) by mouth 2 (two) times daily as needed. Patient taking differently: Take 10 mg by mouth 2 (two) times daily as needed for itching or anxiety.  11/20/19   Dohmeier, Asencion Partridge, MD  insulin NPH Human (HUMULIN N,NOVOLIN N) 100 UNIT/ML injection Inject 50 Units into the skin 2 (two) times daily.     [provider]  insulin regular (NOVOLIN R,HUMULIN R) 100 units/mL injection Inject 10-30 Units into the skin 3 (three) times daily before meals.     [provider]  Lancets [facility] DELICA PLUS [id number]) [place] 1 each by Other route daily.  03/05/19   [provider]  lidocaine (LIDODERM) 5 % Place 1 patch onto the skin daily. Remove & Discard patch within 12 hours or as directed by MD 12/11/18   Landis Martins, DPM  lidocaine-prilocaine (EMLA) cream Apply 1 application topically See admin instructions. 1 hour before dialysis to port 09/28/19   [provider]  lovastatin (MEVACOR) 40 MG tablet Take 40 mg by mouth at bedtime.     [provider]  multivitamin (RENA-VIT) TABS tablet Take 1 tablet by mouth daily. 10/09/19   [provider]  omeprazole (PRILOSEC) 20 MG capsule Take 20 mg by mouth daily.    [provider]  rOPINIRole (REQUIP XL) 8 MG 24 hr tablet Take 1 tablet (8 mg total) by mouth at bedtime. 11/20/19   Dohmeier, Asencion Partridge, MD  triamcinolone  cream (KENALOG) 0.1 % Apply 1 application topically 2 (two) times daily as needed (itching).  06/20/19   [provider]  Tuberculin PPD (TUBERSOL ID) Inject into the skin. 12/07/19   [provider]   Current Facility-Administered Medications  Medication Dose Route Frequency Provider Last Rate Last Admin  . ALPRAZolam Duanne Moron) tablet 0.25 mg  0.25 mg Oral Daily PRN [place] Donnamarie Poag, DO      . apixaban (ELIQUIS) tablet 2.5 mg  2.5 mg Oral BID Vernelle Emerald, MD   2.5 mg at 01/23/20 0924  . calcium acetate (PHOSLO) capsule 667 mg  667 mg Oral TID with meals Vernelle Emerald, MD   667 mg at 01/23/20 1218  . carbidopa-levodopa (SINEMET IR) 10-100 MG per tablet immediate release 1  tablet  1 tablet Oral TID Vernelle Emerald, MD   1 tablet at 01/23/20 [phone]  . Chlorhexidine Gluconate Cloth 2 % PADS 6 each  6 each Topical Q0600 Valentina Gu, NP   6 each at 01/23/20 [phone]  . cyclobenzaprine (FLEXERIL) tablet 10 mg  10 mg Oral QHS Shalhoub, Sherryll Burger, MD   10 mg at 01/23/20 0130  . doxercalciferol (HECTOROL) injection 2 mcg  2 mcg Intravenous Q M,W,F-HD Valentina Gu, NP      . febuxostat (ULORIC) tablet 40 mg  40 mg Oral Daily Shalhoub, Sherryll Burger, MD   40 mg at 01/23/20 0924  . gabapentin (NEURONTIN) capsule 300 mg  300 mg Oral QHS Shalhoub, Sherryll Burger, MD   300 mg at 01/23/20 0130  . insulin aspart (novoLOG) injection 0-9 Units  0-9 Units Subcutaneous TID AC & HS Shalhoub, Sherryll Burger, MD   1 Units at 01/23/20 0820  . insulin NPH Human (NOVOLIN N) injection 50 Units  50 Units Subcutaneous BID AC & HS Shalhoub, Sherryll Burger, MD   50 Units at 01/23/20 0820  . ondansetron (ZOFRAN) tablet 4 mg  4 mg Oral Q6H PRN Shalhoub, Sherryll Burger, MD       Or  . ondansetron [facility]) injection 4 mg  4 mg Intravenous Q6H PRN Shalhoub, Sherryll Burger, MD      . oxyCODONE-acetaminophen (PERCOCET/ROXICET) 5-325 MG per tablet 1 tablet  1 tablet Oral Q4H PRN Shalhoub, Sherryll Burger, MD   1 tablet at 01/23/20 0131    Or  . oxyCODONE-acetaminophen (PERCOCET/ROXICET) 5-325 MG per tablet 2 tablet  2 tablet Oral Q4H PRN Vernelle Emerald, MD   2 tablet at 01/23/20 0556  . pantoprazole (PROTONIX) EC tablet 40 mg  40 mg Oral Daily Shalhoub, Sherryll Burger, MD   40 mg at 01/23/20 0924  . polyethylene glycol (MIRALAX / GLYCOLAX) packet 17 g  17 g Oral Daily PRN Shalhoub, Sherryll Burger, MD      . pravastatin (PRAVACHOL) tablet 40 mg  40 mg Oral q1800 Shalhoub, Sherryll Burger, MD      . rOPINIRole (REQUIP XL) 24 hr tablet 8 mg  8 mg Oral QHS Shalhoub, Sherryll Burger, MD   8 mg at 01/23/20 0130   Labs: Basic Metabolic Panel: Recent Labs  Lab 01/22/20 1303 01/23/20 0145  NA 143 142  K 4.5 4.8  CL 103 103  CO2 28 26  GLUCOSE 174* 128*  BUN 98* 97*  CREATININE 5.39* 5.27*  CALCIUM 8.2* 7.7*   Liver Function Tests: No results for input(s): AST, ALT, ALKPHOS, BILITOT, PROT, ALBUMIN in the last 168 hours. No results for input(s): LIPASE, AMYLASE in the last 168 hours. No results for input(s): AMMONIA in the last 168 hours. CBC: Recent Labs  Lab 01/22/20 1303 01/23/20 0145  WBC 11.8* 12.4*  NEUTROABS  --  9.5*  HGB 10.6* 10.7*  HCT 35.2* 34.6*  MCV 96.2 95.1  PLT 272 251   Cardiac Enzymes: No results for input(s): CKTOTAL, CKMB, CKMBINDEX, TROPONINI in the last 168 hours. CBG: Recent Labs  Lab 01/23/20 0106 01/23/20 0509 01/23/20 0747 01/23/20 1142  GLUCAP 113* 111* 127* 117*   Iron Studies: No results for input(s): IRON, TIBC, TRANSFERRIN, FERRITIN in the last 72 hours. Studies/Results: DG Shoulder Right  Result Date: 01/22/2020 CLINICAL DATA:  Acute right shoulder pain without known injury. EXAM: RIGHT SHOULDER - 2+ VIEW COMPARISON:  None. FINDINGS: There is no evidence of fracture or dislocation. No joint space  narrowing is noted. Osteophyte formation is seen involving the glenoid fossa. Soft tissues are unremarkable. 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 No acute abnormality  seen in the right shoulder. Electronically Signed   By: Marijo Conception M.D.   On: 01/22/2020 16:12   DG Shoulder Left  Result Date: 01/22/2020 CLINICAL DATA:  Acute left shoulder pain without known injury. EXAM: LEFT SHOULDER - 2+ VIEW COMPARISON:  April 01, 2017. FINDINGS: Possible mildly displaced fracture is seen involving osteophyte arising from the inferior aspect of the glenoid fossa. Humerus and clavicle are unremarkable. There is no evidence of arthropathy or other focal bone abnormality. Soft tissues are unremarkable. IMPRESSION: Possible mildly displaced fracture involving osteophyte arising from inferior aspect of glenoid fossa. Electronically Signed   By: Marijo Conception M.D.   On: 01/22/2020 16:10   DG Knee Complete 4 Views Right  Result Date: 01/22/2020 CLINICAL DATA:  Right knee pain after injury 2 days ago. EXAM: RIGHT KNEE - COMPLETE 4+ VIEW COMPARISON:  None. FINDINGS: Status post right total knee arthroplasty. The right femoral and tibial components appear to be well situated. No fracture or dislocation is noted. No joint effusion is noted. IMPRESSION: Status post right total knee arthroplasty. No acute abnormality seen in the right knee. Electronically Signed   By: Marijo Conception M.D.   On: 01/22/2020 16:07    ROS: As per HPI otherwise negative.  Review of Systems: Gen: Denies any fever, chills, sweats, anorexia, fatigue, weakness, malaise, weight loss, and sleep disorder HEENT: No visual complaints, No history of Retinopathy. Normal external appearance No Epistaxis or Sore throat. No sinusitis.   CV: Denies chest pain, angina, palpitations, syncope, orthopnea, PND, peripheral edema, and claudication. Resp: Denies dyspnea at rest, dyspnea with exercise, cough, sputum, wheezing, coughing up blood, and pleurisy. GI: Denies vomiting blood, jaundice, and fecal incontinence.   Denies dysphagia or odynophagia. GU : Denies urinary burning, blood in urine, urinary frequency, urinary  hesitancy, nocturnal urination, and urinary incontinence.  No renal calculi. MS: Denies joint pain, limitation of movement, and swelling, stiffness, low back pain, extremity pain. Denies muscle weakness, cramps, atrophy.  No use of non steroidal antiinflammatory drugs. Derm: Denies rash, itching, dry skin, hives, moles, warts, or unhealing ulcers.  Psych: Denies depression, anxiety, memory loss, suicidal ideation, hallucinations, paranoia, and confusion. Heme: Denies bruising, bleeding, and enlarged lymph nodes. Neuro: No headache.  No diplopia. No dysarthria.  No dysphasia.  No history of CVA.  No Seizures. No paresthesias.  No weakness. Endocrine No DM.  No Thyroid disease.  No Adrenal disease.  Physical Exam: Vitals:   01/23/20 0430 01/23/20 0515 01/23/20 1001 01/23/20 1334  BP: 129/65 (!) 120/56 97/65 130/60  Pulse: 86 82 73 80  Resp: 18 17 20 20   Temp: 97.9 F (36.6 C) 98.1 F (36.7 C) 98.2 F (36.8 C) 97.8 F (36.6 C)  TempSrc: Oral Oral Oral Oral  SpO2: 100% 98% 94% 96%     General: Obese elderly female in no acute distress. Head: Normocephalic, atraumatic, sclera non-icteric, mucus membranes are moist Neck: Supple. JVD not elevated. Lungs: Clear bilaterally to auscultation without wheezes, rales, or rhonchi. Breathing is unlabored. Heart: RRR with S1 S2. No murmurs, rubs, or gallops appreciated. Abdomen: Soft, non-tender, non-distended with normoactive bowel sounds. No rebound/guarding. No obvious abdominal masses. M-S:  Strength and tone appear normal for age. Lower extremities:1+ BLE pitting edema with erythema on calves Neuro: Alert and oriented X 3. Moves all extremities  spontaneously. Psych:  Responds to questions appropriately with a normal affect. Dialysis Access: R AVF + bruit  Dialysis Orders: [facility] MWF 4 hr 15 min 180NRe 400/800 98.5 kg 2.0K/2.0Ca  -Heparin 3000 units IV TIW -Hectorol 2 mcg IV TIW -Venofer 50 mg PO IV q week  Assessment/Plan: 1.  Shoulder  pain-per primary 2.  ESRD -  MWF schedule. HD today off schedule, again tomorrow if still here to get back on schedule. 2.0 K bath, usual heparin.  3.  Hypertension/volume  - BP controlled, BLE D/T missed HD. UF to OP EDW.  4.  Anemia  - HGB 10.7. No ESA as OP. Follow HGB 5.  Metabolic bone disease - Continue binders, VDRA. OP labs at goal. 6.  Nutrition -Renal/Carb mod diet.  7.  DM-per primary 8.  H/O DVT on Eliquis  Chantry Headen H. Owens Shark, NP-C 01/23/2020, 2:22 PM  [employer] [phone]

## 2020-01-24 ENCOUNTER — Other Ambulatory Visit: Payer: Self-pay

## 2020-01-24 DIAGNOSIS — K219 Gastro-esophageal reflux disease without esophagitis: Secondary | ICD-10-CM | POA: Diagnosis present

## 2020-01-24 DIAGNOSIS — R609 Edema, unspecified: Secondary | ICD-10-CM | POA: Diagnosis present

## 2020-01-24 DIAGNOSIS — L89222 Pressure ulcer of left hip, stage 2: Secondary | ICD-10-CM | POA: Diagnosis present

## 2020-01-24 DIAGNOSIS — R2689 Other abnormalities of gait and mobility: Secondary | ICD-10-CM | POA: Diagnosis present

## 2020-01-24 DIAGNOSIS — E1122 Type 2 diabetes mellitus with diabetic chronic kidney disease: Secondary | ICD-10-CM | POA: Diagnosis present

## 2020-01-24 DIAGNOSIS — Z87891 Personal history of nicotine dependence: Secondary | ICD-10-CM | POA: Diagnosis not present

## 2020-01-24 DIAGNOSIS — Z86718 Personal history of other venous thrombosis and embolism: Secondary | ICD-10-CM | POA: Diagnosis not present

## 2020-01-24 DIAGNOSIS — G4733 Obstructive sleep apnea (adult) (pediatric): Secondary | ICD-10-CM | POA: Diagnosis present

## 2020-01-24 DIAGNOSIS — Z992 Dependence on renal dialysis: Secondary | ICD-10-CM | POA: Diagnosis not present

## 2020-01-24 DIAGNOSIS — I10 Essential (primary) hypertension: Secondary | ICD-10-CM | POA: Diagnosis not present

## 2020-01-24 DIAGNOSIS — X58XXXA Exposure to other specified factors, initial encounter: Secondary | ICD-10-CM | POA: Diagnosis present

## 2020-01-24 DIAGNOSIS — E1165 Type 2 diabetes mellitus with hyperglycemia: Secondary | ICD-10-CM | POA: Diagnosis present

## 2020-01-24 DIAGNOSIS — N186 End stage renal disease: Secondary | ICD-10-CM | POA: Diagnosis present

## 2020-01-24 DIAGNOSIS — E785 Hyperlipidemia, unspecified: Secondary | ICD-10-CM | POA: Diagnosis present

## 2020-01-24 DIAGNOSIS — M25712 Osteophyte, left shoulder: Secondary | ICD-10-CM | POA: Diagnosis present

## 2020-01-24 DIAGNOSIS — Z9115 Patient's noncompliance with renal dialysis: Secondary | ICD-10-CM | POA: Diagnosis not present

## 2020-01-24 DIAGNOSIS — N2581 Secondary hyperparathyroidism of renal origin: Secondary | ICD-10-CM | POA: Diagnosis present

## 2020-01-24 DIAGNOSIS — S42142A Displaced fracture of glenoid cavity of scapula, left shoulder, initial encounter for closed fracture: Secondary | ICD-10-CM | POA: Diagnosis present

## 2020-01-24 DIAGNOSIS — I890 Lymphedema, not elsewhere classified: Secondary | ICD-10-CM | POA: Diagnosis present

## 2020-01-24 DIAGNOSIS — I120 Hypertensive chronic kidney disease with stage 5 chronic kidney disease or end stage renal disease: Secondary | ICD-10-CM | POA: Diagnosis present

## 2020-01-24 DIAGNOSIS — E8889 Other specified metabolic disorders: Secondary | ICD-10-CM | POA: Diagnosis present

## 2020-01-24 DIAGNOSIS — G8929 Other chronic pain: Secondary | ICD-10-CM | POA: Diagnosis present

## 2020-01-24 DIAGNOSIS — R52 Pain, unspecified: Secondary | ICD-10-CM | POA: Diagnosis present

## 2020-01-24 DIAGNOSIS — L89212 Pressure ulcer of right hip, stage 2: Secondary | ICD-10-CM | POA: Diagnosis present

## 2020-01-24 DIAGNOSIS — M25511 Pain in right shoulder: Secondary | ICD-10-CM | POA: Diagnosis present

## 2020-01-24 DIAGNOSIS — G2581 Restless legs syndrome: Secondary | ICD-10-CM | POA: Diagnosis present

## 2020-01-24 DIAGNOSIS — Z20822 Contact with and (suspected) exposure to covid-19: Secondary | ICD-10-CM | POA: Diagnosis present

## 2020-01-24 DIAGNOSIS — Z638 Other specified problems related to primary support group: Secondary | ICD-10-CM | POA: Diagnosis not present

## 2020-01-24 LAB — CBC WITH DIFFERENTIAL/PLATELET
Abs Immature Granulocytes: 0.12 10*3/uL — ABNORMAL HIGH (ref 0.00–0.07)
Basophils Absolute: 0 10*3/uL (ref 0.0–0.1)
Basophils Relative: 0 %
Eosinophils Absolute: 0.3 10*3/uL (ref 0.0–0.5)
Eosinophils Relative: 3 %
HCT: 33.1 % — ABNORMAL LOW (ref 36.0–46.0)
Hemoglobin: 10.1 g/dL — ABNORMAL LOW (ref 12.0–15.0)
Immature Granulocytes: 1 %
Lymphocytes Relative: 12 %
Lymphs Abs: 1.2 10*3/uL (ref 0.7–4.0)
MCH: 29.6 pg (ref 26.0–34.0)
MCHC: 30.5 g/dL (ref 30.0–36.0)
MCV: 97.1 fL (ref 80.0–100.0)
Monocytes Absolute: 0.8 10*3/uL (ref 0.1–1.0)
Monocytes Relative: 8 %
Neutro Abs: 7.6 10*3/uL (ref 1.7–7.7)
Neutrophils Relative %: 76 %
Platelets: 239 10*3/uL (ref 150–400)
RBC: 3.41 MIL/uL — ABNORMAL LOW (ref 3.87–5.11)
RDW: 18.4 % — ABNORMAL HIGH (ref 11.5–15.5)
WBC: 10.1 10*3/uL (ref 4.0–10.5)
nRBC: 0 % (ref 0.0–0.2)

## 2020-01-24 LAB — COMPREHENSIVE METABOLIC PANEL
ALT: 11 U/L (ref 0–44)
AST: 16 U/L (ref 15–41)
Albumin: 2.5 g/dL — ABNORMAL LOW (ref 3.5–5.0)
Alkaline Phosphatase: 129 U/L — ABNORMAL HIGH (ref 38–126)
Anion gap: 11 (ref 5–15)
BUN: 35 mg/dL — ABNORMAL HIGH (ref 8–23)
CO2: 30 mmol/L (ref 22–32)
Calcium: 7.5 mg/dL — ABNORMAL LOW (ref 8.9–10.3)
Chloride: 98 mmol/L (ref 98–111)
Creatinine, Ser: 3.38 mg/dL — ABNORMAL HIGH (ref 0.44–1.00)
GFR calc Af Amer: 15 mL/min — ABNORMAL LOW (ref >60)
GFR calc non Af Amer: 13 mL/min — ABNORMAL LOW (ref >60)
Glucose, Bld: 102 mg/dL — ABNORMAL HIGH (ref 70–99)
Potassium: 4.1 mmol/L (ref 3.5–5.1)
Sodium: 139 mmol/L (ref 135–145)
Total Bilirubin: 0.4 mg/dL (ref 0.3–1.2)
Total Protein: 5.7 g/dL — ABNORMAL LOW (ref 6.5–8.1)

## 2020-01-24 LAB — GLUCOSE, CAPILLARY
Glucose-Capillary: 117 mg/dL — ABNORMAL HIGH (ref 70–99)
Glucose-Capillary: 62 mg/dL — ABNORMAL LOW (ref 70–99)
Glucose-Capillary: 72 mg/dL (ref 70–99)
Glucose-Capillary: 118 mg/dL — ABNORMAL HIGH (ref 70–99)
Glucose-Capillary: 143 mg/dL — ABNORMAL HIGH (ref 70–99)

## 2020-01-24 LAB — HEPATITIS B SURFACE ANTIGEN: Hepatitis B Surface Ag: NONREACTIVE

## 2020-01-24 LAB — URIC ACID: Uric Acid, Serum: 1.5 mg/dL — ABNORMAL LOW (ref 2.5–7.1)

## 2020-01-24 LAB — MAGNESIUM: Magnesium: 1.4 mg/dL — ABNORMAL LOW (ref 1.7–2.4)

## 2020-01-24 MED ORDER — WHITE PETROLATUM EX OINT
TOPICAL_OINTMENT | CUTANEOUS | Status: AC
  Administered 2020-01-24: 0.2
  Filled 2020-01-24: qty 28.35

## 2020-01-24 MED ORDER — INSULIN NPH (HUMAN) (ISOPHANE) 100 UNIT/ML ~~LOC~~ SUSP
20.0000 [IU] | Freq: Two times a day (BID) | SUBCUTANEOUS | Status: DC
  Administered 2020-01-24 – 2020-01-25 (×2): 20 [IU] via SUBCUTANEOUS
  Filled 2020-01-24: qty 10

## 2020-01-24 MED ORDER — DOXERCALCIFEROL 4 MCG/2ML IV SOLN
INTRAVENOUS | Status: AC
  Filled 2020-01-24: qty 2

## 2020-01-24 MED ORDER — MAGNESIUM SULFATE 2 GM/50ML IV SOLN
2.0000 g | Freq: Once | INTRAVENOUS | Status: AC
  Administered 2020-01-24: 2 g via INTRAVENOUS
  Filled 2020-01-24: qty 50

## 2020-01-24 MED ORDER — DEXTROSE 50 % IV SOLN
INTRAVENOUS | Status: AC
  Administered 2020-01-24: 25 mL
  Filled 2020-01-24: qty 50

## 2020-01-24 NOTE — Plan of Care (Signed)

## 2020-01-24 NOTE — Evaluation (Signed)
Occupational Therapy Evaluation Patient Details Name: Jean Mcclain MRN: [id number] DOB: 1946-03-18 Today's Date: 01/24/2020    History of Present Illness 74 yo female admitted from home on 4/14 with bilateral posterior thigh pressure injuries due to inability to transfer out of power chair, bilateral shoulder pain, and acute R knee pain due to running into wall with power chair. Xray L shoulder reveals mildly displaced fracture involving osteophyte from inferior aspect of glenoid fossa, ortho recommends conservative management. R knee xray negative for acute findings. PMH includes ESRD on HD, DM, HTN, GERD, RLS, L DVT, OSA, anxiety, HLD.   Clinical Impression   PTA patient reports "managing" self care and mobility using power chair with modified independence at home. Admitted for above and limited by problem list below, including pain, decreased activity tolerance, limited functional use of and ROM BUEs actively, impaired balance, impaired coordination.  She currently requires max -total assist +2 for all self care, max assist for rolling.  Session limited to bed level due to generalized pain. Repositioned for comfort upon exit, encouraged elevation of UEs on pillows to support shoulder pain.   She will benefit from further OT services while admitted and after dc at SNF level to optimize independence and safety with ADLs, mobility to decrease burden of care. Will follow.    Follow Up Recommendations  SNF;Supervision/Assistance - 24 hour    Equipment Recommendations  Other (comment)(TBD at next venue of care)    Recommendations for Other Services       Precautions / Restrictions Precautions Precautions: Fall Precaution Comments: pressure injuries, posterior thighs and gluteal region Restrictions Weight Bearing Restrictions: No      Mobility Bed Mobility Overal bed mobility: Needs Assistance Bed Mobility: Rolling Rolling: Max assist         General bed mobility comments: max  assist to roll to L and R for hygiene and linen change; limited by pain to push with LEs and reach with UEs   Transfers                 General transfer comment: deferred due to pain     Balance                                           ADL either performed or assessed with clinical judgement   ADL Overall ADL's : Needs assistance/impaired     Grooming: Maximal assistance;Bed level   Upper Body Bathing: Maximal assistance;Bed level   Lower Body Bathing: Total assistance;+2 for physical assistance;+2 for safety/equipment;Bed level   Upper Body Dressing : Maximal assistance;Bed level   Lower Body Dressing: Total assistance;+2 for physical assistance;+2 for safety/equipment;Bed level     Toilet Transfer Details (indicate cue type and reason): deferred Toileting- Clothing Manipulation and Hygiene: Total assistance;+2 for physical assistance;+2 for safety/equipment;Bed level       Functional mobility during ADLs: Maximal assistance;+2 for physical assistance;+2 for safety/equipment;Cueing for safety;Cueing for sequencing General ADL Comments: pt limited by weakness, pain     Vision         Perception     Praxis      Pertinent Vitals/Pain Pain Assessment: 0-10 Pain Score: 10-Worst pain ever Pain Location: shoulders, "all over"  Pain Descriptors / Indicators: Discomfort;Sore Pain Intervention(s): Monitored during session;Repositioned;Limited activity within patient's tolerance     Hand Dominance Right   Extremity/Trunk Assessment Upper Extremity Assessment Upper  Extremity Assessment: Generalized weakness;RUE deficits/detail;LUE deficits/detail RUE Deficits / Details: able to tolerate 90* PROM FF, limited by pain for FF AROM to 30*; elbow and distal 3/5 MMT  RUE Sensation: (hypersensitive to touc,h) RUE Coordination: decreased fine motor;decreased gross motor LUE Deficits / Details: able to tolerate 90* PROM FF, limited by pain for FF AROM  to 30*; elbow and distal 3/5 MMT  LUE Sensation: (hypersensitive to touc,h) LUE Coordination: decreased fine motor;decreased gross motor   Lower Extremity Assessment Lower Extremity Assessment: Defer to PT evaluation       Communication Communication Communication: No difficulties   Cognition Arousal/Alertness: Awake/alert Behavior During Therapy: Anxious Overall Cognitive Status: No family/caregiver present to determine baseline cognitive functioning                                 General Comments: appears WFL, further assessment recommended   General Comments       Exercises     Shoulder Instructions      Home Living Family/patient expects to be discharged to:: Private residence Living Arrangements: Alone Available Help at Discharge: Family;Available PRN/intermittently(brother calls about 1x/week) Type of Home: House Home Access: Stairs to enter;Ramped entrance Entrance Stairs-Number of Steps: 5 Entrance Stairs-Rails: Right;Left Home Layout: One level     Bathroom Shower/Tub: Other (comment)(sponge bath )   Bathroom Toilet: Standard     Home Equipment: Walker - 2 wheels;Shower seat;Bedside commode;Wheelchair - power   Additional Comments: sleeps in recliner       Prior Functioning/Environment Level of Independence: Independent with assistive device(s)        Comments: Pt reports doing on ADLs for self PTA, states she was using power chair throughout house but must stand and walk into bathroom for toileting. Pt reports significant difficulty taking care of self at home, reports no falls but states last week she slid down power chair when reaching for something and EMS had to lift her back to chair. Uses 2 LO2 at night, sleeps in a lift chair        OT Problem List: Decreased strength;Decreased activity tolerance;Impaired balance (sitting and/or standing);Decreased range of motion;Decreased coordination;Decreased safety awareness;Decreased  knowledge of use of DME or AE;Decreased knowledge of precautions;Cardiopulmonary status limiting activity;Pain;Obesity;Impaired UE functional use;Impaired sensation;Increased edema      OT Treatment/Interventions: Self-care/ADL training;DME and/or AE instruction;Therapeutic exercise;Therapeutic activities;Cognitive remediation/compensation;Patient/family education;Balance training;Energy conservation    OT Goals(Current goals can be found in the care plan section) Acute Rehab OT Goals Patient Stated Goal: less pain  OT Goal Formulation: With patient Time For Goal Achievement: 02/07/20 Potential to Achieve Goals: Fair  OT Frequency: Min 2X/week   Barriers to D/C:            Co-evaluation              AM-PAC OT "6 Clicks" Daily Activity     Outcome Measure Help from another person eating meals?: A Lot Help from another person taking care of personal grooming?: A Lot Help from another person toileting, which includes using toliet, bedpan, or urinal?: Total Help from another person bathing (including washing, rinsing, drying)?: A Lot Help from another person to put on and taking off regular upper body clothing?: A Lot Help from another person to put on and taking off regular lower body clothing?: Total 6 Click Score: 10   End of Session Equipment Utilized During Treatment: Oxygen Nurse Communication: Mobility status  Activity Tolerance: Patient limited  by pain Patient left: in bed;with call bell/phone within reach;with bed alarm set  OT Visit Diagnosis: Other abnormalities of gait and mobility (R26.89);Muscle weakness (generalized) (M62.81);Pain Pain - part of body: (generalized)                Time: 7127-8718 OT Time Calculation (min): 30 min Charges:  OT General Charges $OT Visit: 1 Visit OT Evaluation $OT Eval Moderate Complexity: 1 Mod OT Treatments $Self Care/Home Management : 8-22 mins  Jolaine Artist, OT Acute Rehabilitation Services Pager [phone] Office  [phone]   Jean Mcclain 01/24/2020, 2:50 PM

## 2020-01-24 NOTE — Procedures (Signed)
I was present at this dialysis session. I have reviewed the session itself and made appropriate changes.   Vital signs in last 24 hours:  Temp:  [97.7 F (36.5 C)-98.6 F (37 C)] 98.3 F (36.8 C) (04/16 0516) Pulse Rate:  [73-92] 82 (04/16 0516) Resp:  [12-20] 17 (04/16 0516) BP: (97-130)/(40-65) 123/62 (04/16 0516) SpO2:  [94 %-100 %] 100 % (04/16 0516) Weight:  [100.5 kg-103.3 kg] 100.5 kg (04/15 1835) Weight change:  Filed Weights   01/23/20 1412 01/23/20 1835  Weight: 103.3 kg 100.5 kg    Recent Labs  Lab 01/24/20 0158  NA 139  K 4.1  CL 98  CO2 30  GLUCOSE 102*  BUN 35*  CREATININE 3.38*  CALCIUM 7.5*    Recent Labs  Lab 01/22/20 1303 01/23/20 0145 01/24/20 0158  WBC 11.8* 12.4* 10.1  NEUTROABS  --  9.5* 7.6  HGB 10.6* 10.7* 10.1*  HCT 35.2* 34.6* 33.1*  MCV 96.2 95.1 97.1  PLT 272 251 239    Scheduled Meds: . apixaban  2.5 mg Oral BID  . calcium acetate  667 mg Oral TID with meals  . carbidopa-levodopa  1 tablet Oral TID  . Chlorhexidine Gluconate Cloth  6 each Topical Q0600  . cyclobenzaprine  10 mg Oral QHS  . doxercalciferol  2 mcg Intravenous Q M,W,F-HD  . febuxostat  40 mg Oral Daily  . gabapentin  300 mg Oral QHS  . insulin aspart  0-9 Units Subcutaneous TID AC & HS  . insulin NPH Human  20 Units Subcutaneous BID AC & HS  . pantoprazole  40 mg Oral Daily  . pravastatin  40 mg Oral q1800  . rOPINIRole  8 mg Oral QHS   Continuous Infusions: . magnesium sulfate bolus IVPB     PRN Meds:.ALPRAZolam, ondansetron **OR** ondansetron (ZOFRAN) IV, oxyCODONE-acetaminophen **OR** oxyCODONE-acetaminophen, polyethylene glycol    Dialysis Orders: [facility] MWF 4 hr 15 min 180NRe 400/800 98.5 kg 2.0K/2.0Ca  -Heparin 3000 units IV TIW -Hectorol 2 mcg IV TIW -Venofer 50 mg PO IV q week  Assessment/Plan: 1.  Bilateral Shoulder pain-  Unable to perform ADL's.  PT recommends SNF placement.  Plan per primary 2.  ESRD -  MWF schedule. HD today to get back on  schedule. 2.0 K bath, usual heparin.  3.  Hypertension/volume  - BP controlled, BLE D/T missed HD. UF to OP EDW.  4.  Anemia  - HGB 10.7. No ESA as OP. Follow HGB 5.  Metabolic bone disease - Continue binders, VDRA. OP labs at goal. 6.  Nutrition -Renal/Carb mod diet.  7.  DM-per primary 8.  H/O DVT on Eliquis  Donetta Potts,  MD 01/24/2020, 9:15 AM

## 2020-01-24 NOTE — Progress Notes (Signed)
Hypoglycemic Event  CBG: 62  Treatment: 51mL D50  Symptoms: pt asymptomatic  Follow-up CBG Time: 0826 CBG Result: 117  Possible Reasons for Event: unknown  Comments/MD notified: Randall Hiss [place] DO paged  0805: Dialysis notified of CBG 62. Will recheck on floor before transport to dialysis.   0827: Dialysis notified of CBG 117 post D50 admin. Awaiting transport to dialysis. Eric [place] DO aware.   Racheal Patches, RN

## 2020-01-24 NOTE — Progress Notes (Signed)
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  Over the past several months, she has been experiencing progressive bilateral shoulder pain, worse with movement.  She also endorses injury to right knee while utilizing her motorized wheelchair.  Recently evaluated by PCP and was treated with a course of prednisone which did not change or alleviate any of her symptoms.  Due to her continued severe pain, she has been unable to mobilize resulting in development of wounds on her posterior thighs.  Upon evaluation in the ED, x-rays of bilateral shoulders as well as right knee were performed.  Notable was a mildly displaced fracture involving osteophyte arising from the inferior aspect of the glenoid fossa on the left shoulder, this was discussed with orthopedic surgery on-call who recommended no intervention.  Given patient's inability to mobilize, living alone, very unsafe discharge plan and patient was admitted to the hospital service for full therapy evaluation with likely need of SNF placement.   Assessment & Plan:   Active Problems:   Essential hypertension   OSA (obstructive sleep apnea)   Chronic acquired lymphedema   ESRD on hemodialysis (HCC)   Pressure injury of skin   Bilateral shoulder pain   Right knee pain   Uncontrolled type 2 diabetes mellitus with hyperglycemia, with long-term current use of insulin (HCC)   Intractable pain   Bilateral shoulder pain Right knee pain Patient presenting with progressive shoulder and knee pain.  Has progressed to the point where she is unable to perform  ADLs at home and has subsequently missed 2 HD sessions.  Right knee x-ray with no acute findings, status post total knee arthroplasty.  Right shoulder with no acute findings.  Left shoulder x-ray with mildly displaced fracture inferior aspect of glenoid fossa.  X-ray findings were discussed with orthopedics on-call Silvestre Gunner who recommended medical management and no surgical intervention indicated at this time. Patient seen by PT/OT, recommend SNF placement.  Prior to this acute decline, patient utilized motorized wheelchair, otherwise was independent of ADLs and living alone.  Currently patient unsafe discharge back to home at this time given her need for maximum assistance with ADLs. --Social work for SNF placement, 3 bed offers, patient wished to discuss with her brother --Control with Percocet q4h prn  ESRD on HD MWF Patient undergoes dialysis at the [facility].  Has missed the last 2 HD sessions due to her decreased mobility. --Nephrology following, appreciate assistance --Continue HD while inpatient, repeating session today to get back on schedule  Type 2 diabetes mellitus Hemoglobin A1c 6.3, well controlled.  Glucose down to 62 this morning, --Reduce NPH to 20 units [place] BID (on NPH 50 units [place] BID at home) --Insulin sliding scale for further coverage --CBGs qAC/HS  OSA --Continue nocturnal CPAP  HLD: Continue pravastatin 40 mg p.o. daily  GERD: Continue PPI  RLS: Continue ropinirole 8 mg p.o. nightly  Hx left popliteal DVT --Continue Eliquis 2.5 mg twice daily  Pressure injury of skin, present on admission Right/left posterior thigh pressure ulcers, stage II --Wound care following, appreciate assistance --Continue offloading, local wound care   Pressure Injury  01/22/20 Thigh Right;Left;Posterior Stage 2 -  Partial thickness loss of dermis presenting as a shallow open injury with a red, pink wound bed without slough. two large pressure/friction wounds noted to the  posterior right and left thighs (Active)  01/22/20 1848  Location: Thigh  Location Orientation: Right;Left;Posterior  Staging: Stage 2 -  Partial thickness loss of dermis presenting as a shallow open injury with a red, pink wound bed without slough.  Wound Description (Comments): two large pressure/friction wounds noted to the posterior right and left thighs.   Present on Admission: Yes     DVT prophylaxis: Eliquis Code Status: Full code Family Communication: Discussed with patient extensively at bedside  Disposition Plan:  Status is: Observation  The patient will require care spanning > 2 midnights and should be moved to inpatient because: Ongoing active pain requiring inpatient pain management, Ongoing diagnostic testing needed not appropriate for outpatient work up, Unsafe d/c plan and Inpatient level of care appropriate due to severity of illness  Dispo: The patient is from: Home              Anticipated d/c is to: SNF              Anticipated d/c date is: 1 day              Patient currently is medically stable to d/c.   Consultants:   Nephrology  Procedures:   None  Antimicrobials:   None   Subjective: Patient seen and examined bedside, continues to complain of generalized shoulder and knee pain.  Seen by PT this morning with recommendation of SNF placement.  Apparently has 3 bed offers per social work, and would like to review with brother before making a decision.  States has missed hemodialysis last 2 appointments due to inability to mobilize at home.  She currently lives alone with poor social support and is unsafe discharge back home even with home health care at this time.  No other complaints or concerns at this time.  Denies headache, no chest pain, palpitations, no shortness of breath, no abdominal pain.  No acute events overnight per nursing staff.  Objective: Vitals:   01/24/20 1100 01/24/20 1130 01/24/20 1158 01/24/20 1341  BP: (!) 120/57 124/68 (!)  129/55 128/63  Pulse: 80 82 84 86  Resp:   16 18  Temp:   98.1 F (36.7 C) 98.3 F (36.8 C)  TempSrc:   Oral Oral  SpO2:   100% 100%  Weight:   98.1 kg     Intake/Output Summary (Last 24 hours) at 01/24/2020 1514 Last data filed at 01/24/2020 1240 Gross per 24 hour  Intake 540 ml  Output 4404 ml  Net -3864 ml   Filed Weights   01/23/20 1835 01/24/20 0845 01/24/20 1158  Weight: 100.5 kg 100.1 kg 98.1 kg    Examination:  General exam: Appears calm and comfortable, obese Respiratory system: Clear to auscultation. Respiratory effort normal.  Oxygenating well on room air Cardiovascular system: S1 & S2 heard, RRR. No JVD, murmurs, rubs, gallops or clicks. No pedal edema. Gastrointestinal system: Abdomen is nondistended, soft and nontender. No organomegaly or masses felt. Normal bowel sounds heard. Central nervous system: Alert and oriented. No focal neurological deficits. Extremities: Symmetric 5 x 5 power.  Normal active/passive range of motion lateral upper/lower extremities Skin: No rashes, lesions or ulcers Psychiatry: Judgement and insight appear poor. Mood & affect appropriate.     Data Reviewed: I have personally reviewed following labs and  imaging studies  CBC: Recent Labs  Lab 01/22/20 1303 01/23/20 0145 01/24/20 0158  WBC 11.8* 12.4* 10.1  NEUTROABS  --  9.5* 7.6  HGB 10.6* 10.7* 10.1*  HCT 35.2* 34.6* 33.1*  MCV 96.2 95.1 97.1  PLT 272 251 454   Basic Metabolic Panel: Recent Labs  Lab 01/22/20 1303 01/23/20 0145 01/24/20 0158  NA 143 142 139  K 4.5 4.8 4.1  CL 103 103 98  CO2 28 26 30   GLUCOSE 174* 128* 102*  BUN 98* 97* 35*  CREATININE 5.39* 5.27* 3.38*  CALCIUM 8.2* 7.7* 7.5*  MG  --   --  1.4*   GFR: CrCl cannot be calculated (Unknown ideal weight.). Liver Function Tests: Recent Labs  Lab 01/24/20 0158  AST 16  ALT 11  ALKPHOS 129*  BILITOT 0.4  PROT 5.7*  ALBUMIN 2.5*   No results for input(s): LIPASE, AMYLASE in the last 168  hours. No results for input(s): AMMONIA in the last 168 hours. Coagulation Profile: No results for input(s): INR, PROTIME in the last 168 hours. Cardiac Enzymes: No results for input(s): CKTOTAL, CKMB, CKMBINDEX, TROPONINI in the last 168 hours. BNP (last 3 results) No results for input(s): PROBNP in the last 8760 hours. HbA1C: Recent Labs    01/22/20 1303  HGBA1C 6.3*   CBG: Recent Labs  Lab 01/23/20 1142 01/23/20 2010 01/24/20 0803 01/24/20 0826 01/24/20 1223  GLUCAP 117* 142* 62* 117* 72   Lipid Profile: No results for input(s): CHOL, HDL, LDLCALC, TRIG, CHOLHDL, LDLDIRECT in the last 72 hours. Thyroid Function Tests: Recent Labs    01/23/20 0105  TSH 2.682   Anemia Panel: No results for input(s): VITAMINB12, FOLATE, FERRITIN, TIBC, IRON, RETICCTPCT in the last 72 hours. Sepsis Labs: No results for input(s): PROCALCITON, LATICACIDVEN in the last 168 hours.  Recent Results (from the past 240 hour(s))  SARS Coronavirus 2 by RT PCR     Status: None   Collection Time: 01/23/20  1:40 AM  Result Value Ref Range Status   SARS Coronavirus 2 NEGATIVE NEGATIVE Final    Comment: (NOTE) Result indicates the ABSENCE of SARS-CoV-2 RNA in the patient specimen.  The lowest concentration of SARS-CoV-2 viral copies this assay can detect in nasopharyngeal swab specimens is 500 copies / mL.  A negative result does not preclude SARS-CoV-2 infection and should not be used as the sole basis for patient management decisions. A negative result may occur with improper specimen collection / handling, submission of a specimen other than nasopharyngeal swab, presence of viral mutation(s) within the areas targeted by this assay, and inadequate number of viral copies (<500 copies / mL) present.  Negative results must be combined with clinical observations, patient history, and epidemiological information.  The expected result is NEGATIVE.  Patient Fact Sheet:    BlogSelections.co.uk   Provider Fact Sheet:  https://lucas.com/   This test is not yet approved or cleared by the [place] FDA and  has been authorized for  detection and/or diagnosis of SARS-CoV-2 by FDA under an Emergency Use Authorization (EUA).  This EUA will remain in effect (meaning this test can be used) for the duration of  the COVID-19 declaration under Section 564(b)(1) of the Act, 21 U.S.C. section 360bbb-3(b)(1), unless the authorization is terminated or revoked sooner Performed at [facility], [place] [address], [place], [place] [address]          Radiology Studies: DG Shoulder Right  Result Date: 01/22/2020 CLINICAL DATA:  Acute right  shoulder pain without known injury. EXAM: RIGHT SHOULDER - 2+ VIEW COMPARISON:  None. FINDINGS: There is no evidence of fracture or dislocation. No joint space narrowing is noted. Osteophyte formation is seen involving the glenoid fossa. Soft tissues are unremarkable. 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 No acute abnormality seen in the right shoulder. Electronically Signed   By: Marijo Conception M.D.   On: 01/22/2020 16:12   DG Shoulder Left  Result Date: 01/22/2020 CLINICAL DATA:  Acute left shoulder pain without known injury. EXAM: LEFT SHOULDER - 2+ VIEW COMPARISON:  April 01, 2017. FINDINGS: Possible mildly displaced fracture is seen involving osteophyte arising from the inferior aspect of the glenoid fossa. Humerus and clavicle are unremarkable. There is no evidence of arthropathy or other focal bone abnormality. Soft tissues are unremarkable. IMPRESSION: Possible mildly displaced fracture involving osteophyte arising from inferior aspect of glenoid fossa. Electronically Signed   By: Marijo Conception M.D.   On: 01/22/2020 16:10   DG Knee Complete 4 Views Right  Result Date: 01/22/2020 CLINICAL DATA:  Right knee pain after injury 2 days ago. EXAM:  RIGHT KNEE - COMPLETE 4+ VIEW COMPARISON:  None. FINDINGS: Status post right total knee arthroplasty. The right femoral and tibial components appear to be well situated. No fracture or dislocation is noted. No joint effusion is noted. IMPRESSION: Status post right total knee arthroplasty. No acute abnormality seen in the right knee. Electronically Signed   By: Marijo Conception M.D.   On: 01/22/2020 16:07        Scheduled Meds: . apixaban  2.5 mg Oral BID  . calcium acetate  667 mg Oral TID with meals  . carbidopa-levodopa  1 tablet Oral TID  . Chlorhexidine Gluconate Cloth  6 each Topical Q0600  . cyclobenzaprine  10 mg Oral QHS  . doxercalciferol      . doxercalciferol  2 mcg Intravenous Q M,W,F-HD  . febuxostat  40 mg Oral Daily  . gabapentin  300 mg Oral QHS  . insulin aspart  0-9 Units Subcutaneous TID AC & HS  . insulin NPH Human  20 Units Subcutaneous BID AC & HS  . pantoprazole  40 mg Oral Daily  . pravastatin  40 mg Oral q1800  . rOPINIRole  8 mg Oral QHS   Continuous Infusions:   LOS: 0 days    Time spent: 36 minutes spent on chart review, discussion with nursing staff, consultants, updating family and interview/physical exam; more than 50% of that time was spent in counseling and/or coordination of care.    Yifan Auker J [place] DO [facility] Available via Epic secure chat 7am-7pm After these hours, please refer to coverage provider listed on [email or web address] 01/24/2020, 3:14 PM

## 2020-01-24 NOTE — Patient Outreach (Signed)
  [facility]) Care Management Chronic Special Needs Program    01/24/2020  Name: Jean Mcclain, DOB: 1945/12/10  MRN: [id number]   Jean Mcclain is enrolled in a chronic special needs plan for Diabetes. Client admitted to hospital for observation for shoulder, knee pain and wounds  Individualized care plan sent to [facility] for observation admission  RNCM communicated with [place] of clients admission and possible admission to SNF/post discharge needs [place] has communicated with inpatient case management of clients need to go to SNF for rehab [place] hospital liaison to follow client while at hospital  [facility] will continue to follow.  Peter Garter RN, Jackquline Denmark, CDE Chronic Care Management Coordinator [facility] Care Management [phone]

## 2020-01-24 NOTE — Progress Notes (Signed)
OT Cancellation Note  Patient Details Name: IDALIS HOELTING MRN: [id number] DOB: 10/11/1945   Cancelled Treatment:    Reason Eval/Treat Not Completed: Patient at procedure or test/ unavailable, pt at HD. Will follow and see as able.   Jolaine Artist, OT Acute Rehabilitation Services Pager [phone] Office [phone]   Delight Stare 01/24/2020, 8:54 AM

## 2020-01-24 NOTE — Care Management Obs Status (Signed)
[place] NOTIFICATION   Patient Details  Name: Jean Mcclain MRN: [id number] Date of Birth: 1945-10-29   Medicare Observation Status Notification Given:  Yes    Marilu Favre, RN 01/24/2020, 8:35 AM

## 2020-01-24 NOTE — TOC Progression Note (Addendum)
Transition of Care [facility]) - Progression Note    Patient Details  Name: Jean Mcclain MRN: [id number] Date of Birth: 1946/07/28  Transition of Care [facility]) CM/SW [place], [place] Phone Number: 01/24/2020, 4:32 PM  Clinical Narrative:    4:50pm- chair time 10:40am; arrival at 10:30, this has been communicated to [facility], they are aware and have left message for pt brother to complete paperwork. Pt dc summary needed by 11 tomorrow, MD aware.   4:32pm- CSW spoke with [facility] they are aware pt has accepted. They need to know dialysis transport time to get her set up before finalizing acceptance.  Pt auth for [facility] is [phone] Auth for [facility] is [phone].   CSW awaits f/u from Renal Navigator to provide chair time to [facility].    Expected Discharge Plan: [place] Barriers to Discharge: No Barriers Identified  Expected Discharge Plan and Services Expected Discharge Plan: [place] Choice: [place] arrangements for the past 2 months: Single Family Home  Readmission Risk Interventions No flowsheet data found.

## 2020-01-24 NOTE — Care Management (Addendum)
[place] to patient and brother [name] at bedside. Discussed SNF bed offers. Patient and [name] have chosen [facility].     Provided SNF bed offers to patient, with medicare.gov ratings. Patient states she wants to discuss bed offers with her brother [name]. NCM offered to call [name] from patient's room and place him on speaker phone. Patient declined. Per patient [name] is on his way to hospital right now.    Magdalen Spatz RN

## 2020-01-25 DIAGNOSIS — I10 Essential (primary) hypertension: Secondary | ICD-10-CM | POA: Diagnosis not present

## 2020-01-25 DIAGNOSIS — E1022 Type 1 diabetes mellitus with diabetic chronic kidney disease: Secondary | ICD-10-CM | POA: Diagnosis not present

## 2020-01-25 DIAGNOSIS — M65872 Other synovitis and tenosynovitis, left ankle and foot: Secondary | ICD-10-CM | POA: Diagnosis not present

## 2020-01-25 DIAGNOSIS — Z9181 History of falling: Secondary | ICD-10-CM | POA: Diagnosis not present

## 2020-01-25 DIAGNOSIS — R52 Pain, unspecified: Secondary | ICD-10-CM | POA: Diagnosis not present

## 2020-01-25 DIAGNOSIS — R2681 Unsteadiness on feet: Secondary | ICD-10-CM | POA: Diagnosis not present

## 2020-01-25 DIAGNOSIS — G8929 Other chronic pain: Secondary | ICD-10-CM | POA: Diagnosis not present

## 2020-01-25 DIAGNOSIS — D509 Iron deficiency anemia, unspecified: Secondary | ICD-10-CM | POA: Diagnosis not present

## 2020-01-25 DIAGNOSIS — I82409 Acute embolism and thrombosis of unspecified deep veins of unspecified lower extremity: Secondary | ICD-10-CM | POA: Diagnosis not present

## 2020-01-25 DIAGNOSIS — M25561 Pain in right knee: Secondary | ICD-10-CM | POA: Diagnosis not present

## 2020-01-25 DIAGNOSIS — K219 Gastro-esophageal reflux disease without esophagitis: Secondary | ICD-10-CM | POA: Diagnosis not present

## 2020-01-25 DIAGNOSIS — L89202 Pressure ulcer of unspecified hip, stage 2: Secondary | ICD-10-CM | POA: Diagnosis not present

## 2020-01-25 DIAGNOSIS — Z86718 Personal history of other venous thrombosis and embolism: Secondary | ICD-10-CM | POA: Diagnosis not present

## 2020-01-25 DIAGNOSIS — I120 Hypertensive chronic kidney disease with stage 5 chronic kidney disease or end stage renal disease: Secondary | ICD-10-CM | POA: Diagnosis not present

## 2020-01-25 DIAGNOSIS — M25519 Pain in unspecified shoulder: Secondary | ICD-10-CM | POA: Diagnosis not present

## 2020-01-25 DIAGNOSIS — S42142D Displaced fracture of glenoid cavity of scapula, left shoulder, subsequent encounter for fracture with routine healing: Secondary | ICD-10-CM | POA: Diagnosis not present

## 2020-01-25 DIAGNOSIS — R278 Other lack of coordination: Secondary | ICD-10-CM | POA: Diagnosis not present

## 2020-01-25 DIAGNOSIS — J9602 Acute respiratory failure with hypercapnia: Secondary | ICD-10-CM | POA: Diagnosis not present

## 2020-01-25 DIAGNOSIS — I5033 Acute on chronic diastolic (congestive) heart failure: Secondary | ICD-10-CM | POA: Diagnosis not present

## 2020-01-25 DIAGNOSIS — E1122 Type 2 diabetes mellitus with diabetic chronic kidney disease: Secondary | ICD-10-CM | POA: Diagnosis not present

## 2020-01-25 DIAGNOSIS — G2581 Restless legs syndrome: Secondary | ICD-10-CM | POA: Diagnosis not present

## 2020-01-25 DIAGNOSIS — E1129 Type 2 diabetes mellitus with other diabetic kidney complication: Secondary | ICD-10-CM | POA: Diagnosis not present

## 2020-01-25 DIAGNOSIS — D689 Coagulation defect, unspecified: Secondary | ICD-10-CM | POA: Diagnosis not present

## 2020-01-25 DIAGNOSIS — R279 Unspecified lack of coordination: Secondary | ICD-10-CM | POA: Diagnosis not present

## 2020-01-25 DIAGNOSIS — G4733 Obstructive sleep apnea (adult) (pediatric): Secondary | ICD-10-CM | POA: Diagnosis not present

## 2020-01-25 DIAGNOSIS — R682 Dry mouth, unspecified: Secondary | ICD-10-CM | POA: Diagnosis not present

## 2020-01-25 DIAGNOSIS — I890 Lymphedema, not elsewhere classified: Secondary | ICD-10-CM | POA: Diagnosis not present

## 2020-01-25 DIAGNOSIS — M159 Polyosteoarthritis, unspecified: Secondary | ICD-10-CM | POA: Diagnosis not present

## 2020-01-25 DIAGNOSIS — M25511 Pain in right shoulder: Secondary | ICD-10-CM | POA: Diagnosis not present

## 2020-01-25 DIAGNOSIS — E1165 Type 2 diabetes mellitus with hyperglycemia: Secondary | ICD-10-CM | POA: Diagnosis not present

## 2020-01-25 DIAGNOSIS — E785 Hyperlipidemia, unspecified: Secondary | ICD-10-CM | POA: Diagnosis not present

## 2020-01-25 DIAGNOSIS — L8996 Pressure-induced deep tissue damage of unspecified site: Secondary | ICD-10-CM | POA: Diagnosis not present

## 2020-01-25 DIAGNOSIS — L8995 Pressure ulcer of unspecified site, unstageable: Secondary | ICD-10-CM | POA: Diagnosis not present

## 2020-01-25 DIAGNOSIS — R0902 Hypoxemia: Secondary | ICD-10-CM | POA: Diagnosis not present

## 2020-01-25 DIAGNOSIS — Z992 Dependence on renal dialysis: Secondary | ICD-10-CM | POA: Diagnosis not present

## 2020-01-25 DIAGNOSIS — N186 End stage renal disease: Secondary | ICD-10-CM | POA: Diagnosis not present

## 2020-01-25 DIAGNOSIS — I959 Hypotension, unspecified: Secondary | ICD-10-CM | POA: Diagnosis not present

## 2020-01-25 DIAGNOSIS — M25512 Pain in left shoulder: Secondary | ICD-10-CM | POA: Diagnosis not present

## 2020-01-25 DIAGNOSIS — Z743 Need for continuous supervision: Secondary | ICD-10-CM | POA: Diagnosis not present

## 2020-01-25 LAB — BASIC METABOLIC PANEL
Anion gap: 8 (ref 5–15)
BUN: 29 mg/dL — ABNORMAL HIGH (ref 8–23)
CO2: 31 mmol/L (ref 22–32)
Calcium: 8.4 mg/dL — ABNORMAL LOW (ref 8.9–10.3)
Chloride: 98 mmol/L (ref 98–111)
Creatinine, Ser: 3.85 mg/dL — ABNORMAL HIGH (ref 0.44–1.00)
GFR calc Af Amer: 13 mL/min — ABNORMAL LOW (ref >60)
GFR calc non Af Amer: 11 mL/min — ABNORMAL LOW (ref >60)
Glucose, Bld: 132 mg/dL — ABNORMAL HIGH (ref 70–99)
Potassium: 4.4 mmol/L (ref 3.5–5.1)
Sodium: 137 mmol/L (ref 135–145)

## 2020-01-25 LAB — GLUCOSE, CAPILLARY
Glucose-Capillary: 116 mg/dL — ABNORMAL HIGH (ref 70–99)
Glucose-Capillary: 128 mg/dL — ABNORMAL HIGH (ref 70–99)

## 2020-01-25 LAB — MAGNESIUM: Magnesium: 2.2 mg/dL (ref 1.7–2.4)

## 2020-01-25 MED ORDER — DOXERCALCIFEROL 4 MCG/2ML IV SOLN: 2.0000 ug | INTRAVENOUS | Status: DC

## 2020-01-25 MED ORDER — PRAVASTATIN SODIUM 40 MG PO TABS: 40.0000 mg | ORAL_TABLET | Freq: Every day | ORAL | 0 refills | Status: DC

## 2020-01-25 MED ORDER — ALPRAZOLAM 0.25 MG PO TABS: 0.2500 mg | ORAL_TABLET | ORAL | 0 refills | Status: AC

## 2020-01-25 MED ORDER — HYDROCODONE-ACETAMINOPHEN 10-325 MG PO TABS: 1.0000 | ORAL_TABLET | ORAL | 0 refills | Status: AC | PRN

## 2020-01-25 MED ORDER — POLYETHYLENE GLYCOL 3350 17 G PO PACK: 17.0000 g | PACK | Freq: Every day | ORAL | 0 refills | Status: DC | PRN

## 2020-01-25 MED ORDER — INSULIN NPH (HUMAN) (ISOPHANE) 100 UNIT/ML ~~LOC~~ SUSP: 20.0000 [IU] | Freq: Two times a day (BID) | SUBCUTANEOUS | 11 refills | Status: DC

## 2020-01-25 NOTE — Progress Notes (Addendum)
  [facility] Progress Note   Subjective:  Seen in room - ongoing shoulder pain. Denies dyspnea/CP. Per notes, possibly for d/c today.  Objective Vitals:   01/24/20 1158 01/24/20 1341 01/24/20 2117 01/25/20 0526  BP: (!) 129/55 128/63 (!) 129/51 (!) 143/61  Pulse: 84 86 83 89  Resp: 16 18 18 18   Temp: 98.1 F (36.7 C) 98.3 F (36.8 C) 98.7 F (37.1 C) 98.4 F (36.9 C)  TempSrc: Oral Oral Oral Oral  SpO2: 100% 100% 98% 95%  Weight: 98.1 kg      Physical Exam General: Overweight woman, NAD Heart: RRR Lungs: CTA anteriorly Extremities: 1+ BLE edema Dialysis Access: AVF  Additional Objective Labs: Basic Metabolic Panel: Recent Labs  Lab 01/23/20 0145 01/24/20 0158 01/25/20 0622  NA 142 139 137  K 4.8 4.1 4.4  CL 103 98 98  CO2 26 30 31   GLUCOSE 128* 102* 132*  BUN 97* 35* 29*  CREATININE 5.27* 3.38* 3.85*  CALCIUM 7.7* 7.5* 8.4*   Liver Function Tests: Recent Labs  Lab 01/24/20 0158  AST 16  ALT 11  ALKPHOS 129*  BILITOT 0.4  PROT 5.7*  ALBUMIN 2.5*   CBC: Recent Labs  Lab 01/22/20 1303 01/23/20 0145 01/24/20 0158  WBC 11.8* 12.4* 10.1  NEUTROABS  --  9.5* 7.6  HGB 10.6* 10.7* 10.1*  HCT 35.2* 34.6* 33.1*  MCV 96.2 95.1 97.1  PLT 272 251 239   Medications:  . apixaban  2.5 mg Oral BID  . calcium acetate  667 mg Oral TID with meals  . carbidopa-levodopa  1 tablet Oral TID  . Chlorhexidine Gluconate Cloth  6 each Topical Q0600  . cyclobenzaprine  10 mg Oral QHS  . doxercalciferol  2 mcg Intravenous Q M,W,F-HD  . febuxostat  40 mg Oral Daily  . gabapentin  300 mg Oral QHS  . insulin aspart  0-9 Units Subcutaneous TID AC & HS  . insulin NPH Human  20 Units Subcutaneous BID AC & HS  . pantoprazole  40 mg Oral Daily  . pravastatin  40 mg Oral q1800  . rOPINIRole  8 mg Oral QHS    Dialysis Orders: [facility] MWF 4 hr 15 min 180NRe 400/800 98.5 kg 2.0K/2.0Ca  -Heparin 3000 units IV TIW -Hectorol 2 mcg IV TIW -Venofer 50 mg PO IV  q week  Assessment/Plan: 1. Bilateral Shoulder pain-  Unable to perform ADL's.  PT recommends SNF placement.  Plan per primary 2. ESRD - MWF schedule - next HD 4/19. 3. Hypertension/volume - BP controlled, BLE D/T missed HD. UF to OP EDW. 4. Anemia - HGB 10.1. No ESA as OP. Follow HGB 5. Metabolic bone disease - Continue binders, VDRA. OP labs at goal. 6. Nutrition -Renal/Carb mod diet.  7. DM-per primary 8. H/O DVT on Eliquis  Katie Stovall, PA-C 01/25/2020, 10:52 AM  [facility]  I have seen and examined this patient and agree with plan and assessment in the above note with renal recommendations/intervention highlighted.  Continue with HD on MWF schedule while she remains an inpatient.  Broadus John A Cerita Rabelo,MD 01/25/2020 1:11 PM

## 2020-01-25 NOTE — Progress Notes (Signed)
Discharged to [facility] facility via [place]. Report given to [facility].Personal belongings given to patient.

## 2020-01-25 NOTE — TOC Transition Note (Signed)
Transition of Care [facility]) - CM/SW Discharge Note   Patient Details  Name: Jean Mcclain MRN: [id number] Date of Birth: Jun 05, 1946  [facility]) CM/SW Contact:  Alexander Mt, LCSW Phone Number: 01/25/2020, 8:33 AM   Clinical Narrative:    Pt stable for d/c per MD, paperwork for d/c sent to [facility]. CSW received message from [place], admissions liaison at [facility], that pt brother can complete paperwork for pt at 1pm. Will arrange PTAR transport when that has been completed.    Final next level of care: Skilled Nursing Facility Barriers to Discharge: Barriers Resolved   Patient Goals and CMS Choice Patient states their goals for this hospitalization and ongoing recovery are:: Conttol pain /heal wounds/feel better CMS Medicare.gov Compare Post Acute Care list provided to:: Patient Choice offered to / list presented to : Patient, Sibling  Discharge Placement   Existing PASRR number confirmed : 01/23/20          Patient chooses bed at: [facility] Patient to be transferred to facility by: [place] Name of family member notified: pt brother [name] Patient and family notified of of transfer: 01/25/20  Discharge Plan and Services Post Acute Care Choice: [place]        Readmission Risk Interventions Readmission Risk Prevention Plan 01/25/2020  Transportation Screening Complete  PCP or Specialist Appt within 5-7 Days Not Complete  Not Complete comments d/c to SNF  Home Care Screening Complete  Medication Review (RN CM) Referral to Pharmacy  Some recent data might be hidden

## 2020-01-25 NOTE — Discharge Summary (Signed)
Physician Discharge Summary  Jean Mcclain:[id number] DOB: [birth year] DOA: 01/22/2020  PCP: Leanna Battles, MD  Admit date: 01/22/2020 Discharge date: 01/25/2020  Admitted From: Home Disposition: [facility]   Recommendations for Outpatient Follow-up:  1. Follow up with PCP in 1-2 weeks 2. Follow-up with nephrology as scheduled, continue hemodialysis at [facility] Monday/Wednesday/Friday 3. 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  Over the past several months, she has been experiencing progressive bilateral shoulder pain, worse with movement.  She also endorses injury to right knee while utilizing her motorized wheelchair.  Recently evaluated by PCP and was treated with a course of prednisone which did not change or alleviate any of her symptoms.  Due to her continued severe pain, she has been unable to mobilize resulting in development of wounds on her posterior thighs.  Upon evaluation in the ED, x-rays of bilateral shoulders as well as right knee were performed.  Notable was a mildly displaced fracture involving osteophyte arising from the inferior aspect of the glenoid fossa on the left shoulder, this was discussed with orthopedic surgery on-call who recommended no intervention.  Given patient's inability to mobilize, living alone, very unsafe discharge plan and patient was admitted to the hospital service for full therapy evaluation with likely need of  SNF placement.  Hospital course:  Bilateral shoulder pain Right knee pain Patient presenting with progressive shoulder and knee pain.  Has progressed to the point where she is unable to perform ADLs at home and has subsequently missed 2 HD sessions.  Right knee x-ray with no acute findings, status post total knee arthroplasty.  Right shoulder with no acute findings.  Left shoulder x-ray with mildly displaced fracture inferior aspect of glenoid fossa.  X-ray findings were discussed with orthopedics on-call Jean Mcclain who recommended medical management and no surgical intervention indicated at this time. Patient seen by PT/OT, recommend SNF placement.  Prior to this acute decline, patient utilized motorized wheelchair, otherwise was independent of ADLs and living alone.  Currently patient unsafe discharge back to home at this time given her need for maximum assistance with ADLs.  Patient will discharge to [facility].  Continue pain control with Percocet prn.  ESRD on HD MWF Patient undergoes dialysis at the [facility].  Has missed the last 2 HD sessions due to her decreased mobility.  Patient was continued on HD while inpatient, recommend outpatient follow-up with nephrology as scheduled.  Continue hemodialysis on Monday/Wednesday/Friday  Type 2 diabetes mellitus Hemoglobin A1c 6.3, well controlled.  Glucose down to 62 this morning.  Patient on NPH 50 units subcutaneously twice daily at home.  This was reduced while inpatient given carbohydrate controlled diet.  Will discharge on NPH 20 units subcutaneously twice daily.  Continue insulin sliding scale for further coverage and close monitoring based on her dietary indiscretions.  GBT:DVVOHYWV nocturnal CPAP  HLD: Continue pravastatin 40 mg p.o. daily  GERD: Continue PPI  RLS: Continue ropinirole 8 mg p.o. nightly  Hx left popliteal DVT:  Continue Eliquis 2.5 mg twice daily  Pressure injury  of skin, present on  admission Right/left posterior thigh pressure ulcers, stage II Continue offloading, local wound care   Pressure Injury 01/22/20 Thigh Right;Left;Posterior Stage 2 -  Partial thickness loss of dermis presenting as a shallow open injury with a red, pink wound bed without slough. two large pressure/friction wounds noted to the posterior right and left thighs (Active)  01/22/20 1848  Location: Thigh  Location Orientation: Right;Left;Posterior  Staging: Stage 2 -  Partial thickness loss of dermis presenting as a shallow open injury with a red, pink wound bed without slough.  Wound Description (Comments): two large pressure/friction wounds noted to the posterior right and left thighs.   Present on Admission: Yes     Discharge Diagnoses:  Active Problems:   Essential hypertension   OSA (obstructive sleep apnea)   Chronic acquired lymphedema   ESRD on hemodialysis (HCC)   Pressure injury of skin   Bilateral shoulder pain   Right knee pain   Uncontrolled type 2 diabetes mellitus with hyperglycemia, with long-term current use of insulin (HCC)   Intractable pain    Discharge Instructions  Discharge Instructions    Diet - low sodium heart healthy   Complete by: As directed    Increase activity slowly   Complete by: As directed      Allergies as of 01/25/2020   No Known Allergies     Medication List    STOP taking these medications   lidocaine 5 % Commonly known as: Lidoderm   lovastatin 40 MG tablet Commonly known as: MEVACOR Replaced by: pravastatin 40 MG tablet   oxyCODONE 5 MG immediate release tablet Commonly known as: Oxy IR/ROXICODONE   predniSONE 20 MG tablet Commonly known as: DELTASONE     TAKE these medications   acetaminophen 500 MG tablet Commonly known as: TYLENOL Take 1,000 mg by mouth 3 (three) times daily as needed for moderate pain or headache.   ALPRAZolam 0.25 MG tablet Commonly known as: Xanax Take 1 tablet (0.25 mg total) by mouth See admin  instructions. Take 0.25 mg by mouth on Mon/Wed/Fri, before the beginning of each dialysis treatment   B-D INS SYR ULTRAFINE 1CC/30G 30G X 1/2" 1 ML Misc Generic drug: Insulin Syringe-Needle U-100 1 each by Other route daily.   calcium acetate 667 MG tablet Commonly known as: PHOSLO Take 667 mg by mouth 3 (three) times daily.   carbidopa-levodopa 10-100 MG tablet Commonly known as: SINEMET IR Take 1 tablet by mouth in the morning and at bedtime.   colchicine 0.6 MG tablet Take 0.6 mg by mouth daily as needed (gout flare).   cyclobenzaprine 10 MG tablet Commonly known as: FLEXERIL Take 10 mg by mouth at bedtime.   doxercalciferol 4 MCG/2ML injection Commonly known as: HECTOROL Inject 1 mL (2 mcg total) into the vein every Monday, Wednesday, and Friday with hemodialysis. Start taking on: January 27, 2020   Eliquis 2.5 MG Tabs tablet Generic drug: apixaban Take 2.5 mg by mouth 2 (two) times daily.   febuxostat 40 MG tablet Commonly known as: ULORIC Take 40 mg by mouth daily.   gabapentin 300 MG capsule Commonly known as: NEURONTIN Take 300 mg by mouth at bedtime.   glucose blood test strip 1 each by Other route daily.   HYDROcodone-acetaminophen 10-325 MG tablet Commonly known as: NORCO Take 1 tablet by mouth every 4 (four) hours as needed for up to 15 days.   hydrOXYzine 10 MG tablet Commonly known as: ATARAX/VISTARIL Take 1 tablet (10 mg total)  by mouth 2 (two) times daily as needed. What changed: reasons to take this   insulin NPH Human 100 UNIT/ML injection Commonly known as: NOVOLIN N Inject 0.2 mLs (20 Units total) into the skin 2 (two) times daily at 8 am and 10 pm. What changed:   how much to take  when to take this  additional instructions   lidocaine-prilocaine cream Commonly known as: EMLA Apply 1 application topically See admin instructions. 1 hour before dialysis to port   multivitamin Tabs tablet Take 1 tablet by mouth daily.   NovoLIN R  ReliOn 100 units/mL injection Generic drug: insulin regular Inject 10-25 Units into the skin 3 (three) times daily before meals. Take 10 units if  cbg is between 100 to 151 if cbg is over 300 take 25 units per patient   omeprazole 20 MG capsule Commonly known as: PRILOSEC Take 20 mg by mouth daily before breakfast.   OneTouch Delica Plus [id number] Misc 1 each by Other route daily.   polyethylene glycol 17 g packet Commonly known as: MIRALAX / GLYCOLAX Take 17 g by mouth daily as needed for mild constipation.   pravastatin 40 MG tablet Commonly known as: PRAVACHOL Take 1 tablet (40 mg total) by mouth daily at 6 PM. Replaces: lovastatin 40 MG tablet   rOPINIRole 8 MG 24 hr tablet Commonly known as: Requip XL Take 1 tablet (8 mg total) by mouth at bedtime.   triamcinolone cream 0.1 % Commonly known as: KENALOG Apply 1 application topically 2 (two) times daily as needed (itching).       Contact information for follow-up providers    Schedule an appointment as soon as possible for a visit  with Leanna Battles, MD.   Specialty: Internal Medicine Why: For recheck of your symptoms Contact information: [place] [id number] [phone]            Contact information for after-discharge care    Destination    [facility]erred SNF .   Service: Skilled Nursing Contact information: [place] [phone]                 No Known Allergies  Consultations:  Nephrology   Procedures/Studies: DG Shoulder Right  Result Date: 01/22/2020 CLINICAL DATA:  Acute right shoulder pain without known injury. EXAM: RIGHT SHOULDER - 2+ VIEW COMPARISON:  None. FINDINGS: There is no evidence of fracture or dislocation. No joint space narrowing is noted. Osteophyte formation is seen involving the glenoid fossa. Soft tissues are unremarkable. 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 No acute abnormality seen in the right shoulder. Electronically Signed   By: Marijo Conception M.D.   On: 01/22/2020 16:12   DG Shoulder Left  Result Date: 01/22/2020 CLINICAL DATA:  Acute left shoulder pain without known injury. EXAM: LEFT SHOULDER - 2+ VIEW COMPARISON:  April 01, 2017. FINDINGS: Possible mildly displaced fracture is seen involving osteophyte arising from the inferior aspect of the glenoid fossa. Humerus and clavicle are unremarkable. There is no evidence of arthropathy or other focal bone abnormality. Soft tissues are unremarkable. IMPRESSION: Possible mildly displaced fracture involving osteophyte arising from inferior aspect of glenoid fossa. Electronically Signed   By: Marijo Conception M.D.   On: 01/22/2020 16:10   DG Knee Complete 4 Views Right  Result Date: 01/22/2020 CLINICAL DATA:  Right knee pain after injury 2 days ago. EXAM: RIGHT KNEE - COMPLETE 4+ VIEW COMPARISON:  None. FINDINGS: Status post right total knee arthroplasty. The right femoral and tibial components appear to be well situated. No fracture or dislocation is noted. No joint effusion is noted. IMPRESSION: Status post right total knee arthroplasty. No acute abnormality seen in the right knee. Electronically Signed   By: Marijo Conception M.D.   On: 01/22/2020 16:07     Subjective: Patient seen and examined bedside, resting comfortably.  No complaints this morning.  Ready for discharge to SNF.  Denies headache, no chest pain, shortness of breath, no abdominal pain.  Discharge Exam: Vitals:   01/24/20 2117 01/25/20 0526  BP: (!) 129/51 (!) 143/61  Pulse: 83 89  Resp: 18 18  Temp: 98.7 F (37.1 C) 98.4 F (36.9 C)  SpO2: 98% 95%   Vitals:   01/24/20 1158 01/24/20 1341 01/24/20 2117 01/25/20 0526  BP: (!) 129/55 128/63 (!) 129/51 (!) 143/61  Pulse: 84 86 83 89  Resp: 16 18 18 18   Temp: 98.1 F (36.7 C) 98.3 F (36.8 C) 98.7 F (37.1 C) 98.4 F (36.9 C)  TempSrc: Oral Oral Oral Oral   SpO2: 100% 100% 98% 95%  Weight: 98.1 kg       General: Pt is alert, awake, not in acute distress, obese Cardiovascular: RRR, S1/S2 +, no rubs, no gallops Respiratory: CTA bilaterally, no wheezing, no rhonchi Abdominal: Soft, NT, ND, bowel sounds + Extremities: no edema, no cyanosis    The results of significant diagnostics from this hospitalization (including imaging, microbiology, ancillary and laboratory) are listed below for reference.     Microbiology: Recent Results (from the past 240 hour(s))  SARS Coronavirus 2 by RT PCR     Status: None   Collection Time: 01/23/20  1:40 AM  Result Value Ref Range Status   SARS Coronavirus 2 NEGATIVE NEGATIVE Final    Comment: (NOTE) Result indicates the ABSENCE of SARS-CoV-2 RNA in the patient specimen.  The lowest concentration of SARS-CoV-2 viral copies this assay can detect in nasopharyngeal swab specimens is 500 copies / mL.  A negative result does not preclude SARS-CoV-2 infection and should not be used as the sole basis for patient management decisions. A negative result may occur with improper specimen collection / handling, submission of a specimen other than nasopharyngeal swab, presence of viral mutation(s) within the areas targeted by this assay, and inadequate number of viral copies (<500 copies / mL) present.  Negative results must be combined with clinical observations, patient history, and epidemiological information.  The expected result is NEGATIVE.  Patient Fact Sheet:  BlogSelections.co.uk   Provider Fact Sheet:  https://lucas.com/   This test is not yet approved or cleared by the [place] FDA and  has been authorized for  detection and/or diagnosis of SARS-CoV-2 by FDA under an Emergency Use Authorization (EUA).  This EUA will remain in effect (meaning this test can be used) for the duration of  the COVID-19 declaration under Section 564(b)(1) of the Act,  21 U.S.C. section 360bbb-3(b)(1), unless the authorization is terminated or revoked sooner Performed at [facility], [place] [address], [place], [place] [address]      Labs: BNP (last 3 results) No results for input(s): BNP in the last 8760 hours. Basic Metabolic Panel: Recent Labs  Lab 01/22/20 1303 01/23/20 0145 01/24/20 0158 01/25/20 0622  NA 143 142 139 137  K 4.5 4.8 4.1 4.4  CL 103 103 98 98  CO2 28 26 30 31   GLUCOSE 174* 128* 102* 132*  BUN 98* 97* 35* 29*  CREATININE 5.39* 5.27* 3.38* 3.85*  CALCIUM 8.2* 7.7* 7.5* 8.4*  MG  --   --  1.4* 2.2   Liver Function Tests: Recent Labs  Lab 01/24/20 0158  AST 16  ALT 11  ALKPHOS 129*  BILITOT 0.4  PROT 5.7*  ALBUMIN 2.5*   No results for input(s): LIPASE, AMYLASE in the last 168 hours. No results for input(s): AMMONIA in the last 168 hours. CBC: Recent Labs  Lab 01/22/20 1303 01/23/20 0145 01/24/20 0158  WBC 11.8* 12.4* 10.1  NEUTROABS  --  9.5* 7.6  HGB 10.6* 10.7* 10.1*  HCT 35.2* 34.6* 33.1*  MCV 96.2 95.1 97.1  PLT 272 251 239   Cardiac Enzymes: No results for input(s): CKTOTAL, CKMB, CKMBINDEX, TROPONINI in the last 168 hours. BNP: Invalid input(s): POCBNP CBG: Recent Labs  Lab 01/24/20 0826 01/24/20 1223 01/24/20 1712 01/24/20 2117 01/25/20 0734  GLUCAP 117* 72 118* 143* 116*   D-Dimer No results for input(s): DDIMER in the last 72 hours. Hgb A1c Recent Labs    01/22/20 1303  HGBA1C 6.3*   Lipid Profile No results for input(s): CHOL, HDL, LDLCALC, TRIG, CHOLHDL, LDLDIRECT in the last 72 hours. Thyroid function studies Recent Labs    01/23/20 0105  TSH 2.682   Anemia work up No results for input(s): VITAMINB12, FOLATE, FERRITIN, TIBC, IRON, RETICCTPCT in the last 72 hours. Urinalysis    Component Value Date/Time   COLORURINE YELLOW 03/30/2017 1718   APPEARANCEUR CLOUDY (A) 03/30/2017 1718   LABSPEC >1.030 (H) 03/30/2017 1718   PHURINE 6.0 03/30/2017 1718   GLUCOSEU  NEGATIVE 03/30/2017 1718   HGBUR LARGE (A) 03/30/2017 1718   BILIRUBINUR NEGATIVE 03/30/2017 1718   KETONESUR NEGATIVE 03/30/2017 1718   PROTEINUR >300 (A) 03/30/2017 1718   UROBILINOGEN 0.2 10/01/2009 1740   NITRITE POSITIVE (A) 03/30/2017 1718   LEUKOCYTESUR MODERATE (A) 03/30/2017 1718   Sepsis Labs Invalid input(s): PROCALCITONIN,  WBC,  LACTICIDVEN Microbiology Recent Results (from the past 240 hour(s))  SARS Coronavirus 2 by RT PCR     Status: None   Collection Time: 01/23/20  1:40 AM  Result Value Ref Range Status   SARS Coronavirus 2 NEGATIVE NEGATIVE Final    Comment: (NOTE) Result indicates the ABSENCE of SARS-CoV-2 RNA in the patient specimen.  The lowest concentration of SARS-CoV-2 viral copies this assay can detect in nasopharyngeal swab specimens is 500 copies / mL.  A negative result does not preclude SARS-CoV-2 infection and should not be used as the sole basis for patient management decisions. A negative result may occur with improper specimen collection / handling, submission of a specimen other than nasopharyngeal swab, presence of viral mutation(s) within the areas targeted by this assay, and inadequate number of viral copies (<500 copies / mL) present.  Negative results must be combined with clinical observations, patient history, and epidemiological information.  The expected result is NEGATIVE.  Patient Fact Sheet:  BlogSelections.co.uk   Provider Fact Sheet:  https://lucas.com/   This test is not yet approved or cleared by the [place] FDA and  has been authorized for  detection and/or diagnosis of SARS-CoV-2 by FDA under an Emergency Use Authorization (EUA).  This EUA will remain in effect (meaning this test can be used) for the duration of  the COVID-19 declaration under Section 564(b)(1) of the Act, 21 U.S.C. section 360bbb-3(b)(1), unless the authorization is terminated or revoked sooner Performed  at [facility], [place] [address],  [place],  [address]      Time coordinating discharge: Over 30 minutes  SIGNED:   Marcina Kinnison J [place] DO  [facility] 01/25/2020, 8:18 AM

## 2020-01-25 NOTE — Social Work (Signed)
Clinical Social Worker facilitated patient discharge including contacting patient family and facility to confirm patient discharge plans.  Clinical information faxed to facility and family agreeable with plan.  CSW arranged ambulance transport via [facility] to [facility] RN to call [phone]  with report prior to discharge.  Clinical Social Worker will sign off for now as social work intervention is no longer needed. Please consult us again if new need arises.  Madyx Delfin, MSW, LCSW Clinical Social Worker   

## 2020-01-25 NOTE — NC FL2 (Signed)
[place] MEDICAID FL2 LEVEL OF CARE SCREENING TOOL     IDENTIFICATION  Patient Name: Jean Mcclain Birthdate: 1946-06-11 Sex: female Admission Date (Current Location): 01/22/2020  Circles Of Care and [place] Number:  Herbalist and Address:  [facility] [place], [place] [address], [place], [place] [address]      Provider Number: [id number]  Attending Physician Name and Address:  [place] Eric J, DO  Relative Name and Phone Number:  Marilynn Rail [phone]    Current Level of Care: Hospital Recommended Level of Care: [place] Prior Approval Number:    Date Approved/Denied:   PASRR Number: [id number]  Discharge Plan: SNF    Current Diagnoses: Patient Active Problem List   Diagnosis Date Noted  . Intractable pain 01/24/2020  . Bilateral shoulder pain 01/23/2020  . Right knee pain 01/23/2020  . Uncontrolled type 2 diabetes mellitus with hyperglycemia, with long-term current use of insulin ([place]) 01/23/2020  . Pressure injury of skin 01/22/2020  . ESRD on hemodialysis ([place]) 11/20/2019  . Myoclonus, segmental 11/20/2019  . OSA (obstructive sleep apnea) 03/13/2019  . Chronic acquired lymphedema 11/05/2018  . Acute deep vein thrombosis (DVT) of popliteal vein of left lower extremity ([place]) 03/11/2017  . Insulin-requiring or dependent type II diabetes mellitus ([place]) 03/11/2017  . Essential hypertension 03/11/2017  . Impingement syndrome of left ankle 01/31/2017  . Posterior tibial tendinitis, left leg 01/31/2017  . Incarcerated ventral hernia 01/02/2013    Orientation RESPIRATION BLADDER Height & Weight     Self, Time, Situation, Place  O2(2L nasal canula) Continent, External catheter(Continent at baseline) Weight: 216 lb 4.3 oz (98.1 kg) Height:     BEHAVIORAL SYMPTOMS/MOOD NEUROLOGICAL BOWEL NUTRITION STATUS      Continent Diet(Renal diet)  AMBULATORY STATUS COMMUNICATION OF NEEDS Skin   Extensive Assist Verbally Skin  abrasions, PU Stage and Appropriate Care(skin tears on bilateral thighs; R AV fistula upper arm; stage 2 PU on R thigh)   PU Stage 2 Dressing: (foam dressing on right thigh)                   Personal Care Assistance Level of Assistance  Bathing, Feeding, Dressing Bathing Assistance: Maximum assistance Feeding assistance: Independent Dressing Assistance: Maximum assistance     Functional Limitations Info  Sight, Hearing, Speech Sight Info: Adequate Hearing Info: Adequate Speech Info: Adequate    SPECIAL CARE FACTORS FREQUENCY                       Contractures Contractures Info: Not present    Additional Factors Info  Code Status, Insulin Sliding Scale, Psychotropic, Allergies Code Status Info: Full Code Allergies Info: No Known Allergies Psychotropic Info: rOPINIRole (REQUIP XL) 24 hr tablet 8 mg daily at bedtime Insulin Sliding Scale Info: insulin aspart (novoLOG) injection 0-9 Units 3x daily with meals; insulin NPH Human (NOVOLIN N) injection 20 Units 2x daily before breakfast and at bedtime       Current Medications (01/25/2020):  This is the current hospital active medication list Current Facility-Administered Medications  Medication Dose Route Frequency Provider Last Rate Last Admin  . ALPRAZolam Duanne Moron) tablet 0.25 mg  0.25 mg Oral Daily PRN [place] Donnamarie Poag, DO      . apixaban Arne Cleveland) tablet 2.5 mg  2.5 mg Oral BID Vernelle Emerald, MD   2.5 mg at 01/24/20 2111  . calcium acetate (PHOSLO) capsule 667 mg  667 mg Oral TID with meals Shalhoub, Sherryll Burger,  MD   667 mg at 01/24/20 1822  . carbidopa-levodopa (SINEMET IR) 10-100 MG per tablet immediate release 1 tablet  1 tablet Oral TID Vernelle Emerald, MD   1 tablet at 01/24/20 2111  . Chlorhexidine Gluconate Cloth 2 % PADS 6 each  6 each Topical Q0600 Valentina Gu, NP   6 each at 01/25/20 0529  . cyclobenzaprine (FLEXERIL) tablet 10 mg  10 mg Oral QHS Shalhoub, Sherryll Burger, MD   10 mg at 01/24/20 2110   . doxercalciferol (HECTOROL) injection 2 mcg  2 mcg Intravenous Q M,W,F-HD Valentina Gu, NP   2 mcg at 01/24/20 1145  . febuxostat (ULORIC) tablet 40 mg  40 mg Oral Daily Shalhoub, Sherryll Burger, MD   40 mg at 01/23/20 0924  . gabapentin (NEURONTIN) capsule 300 mg  300 mg Oral QHS Shalhoub, Sherryll Burger, MD   300 mg at 01/24/20 2110  . insulin aspart (novoLOG) injection 0-9 Units  0-9 Units Subcutaneous TID AC & HS Shalhoub, Sherryll Burger, MD   1 Units at 01/24/20 2122  . insulin NPH Human (NOVOLIN N) injection 20 Units  20 Units Subcutaneous BID AC & HS [place] Eric J, DO   20 Units at 01/24/20 2122  . ondansetron (ZOFRAN) tablet 4 mg  4 mg Oral Q6H PRN Shalhoub, Sherryll Burger, MD       Or  . ondansetron [facility]) injection 4 mg  4 mg Intravenous Q6H PRN Shalhoub, Sherryll Burger, MD      . oxyCODONE-acetaminophen (PERCOCET/ROXICET) 5-325 MG per tablet 1 tablet  1 tablet Oral Q4H PRN Shalhoub, Sherryll Burger, MD   1 tablet at 01/25/20 0553   Or  . oxyCODONE-acetaminophen (PERCOCET/ROXICET) 5-325 MG per tablet 2 tablet  2 tablet Oral Q4H PRN Vernelle Emerald, MD   2 tablet at 01/24/20 1302  . pantoprazole (PROTONIX) EC tablet 40 mg  40 mg Oral Daily Shalhoub, Sherryll Burger, MD   40 mg at 01/23/20 0924  . polyethylene glycol (MIRALAX / GLYCOLAX) packet 17 g  17 g Oral Daily PRN Shalhoub, Sherryll Burger, MD      . pravastatin (PRAVACHOL) tablet 40 mg  40 mg Oral q1800 Vernelle Emerald, MD   40 mg at 01/24/20 1822  . rOPINIRole (REQUIP XL) 24 hr tablet 8 mg  8 mg Oral QHS Shalhoub, Sherryll Burger, MD   8 mg at 01/24/20 2111     Discharge Medications: Please see discharge summary for a list of discharge medications.  Relevant Imaging Results:  Relevant Lab Results:   Additional Information Pt receives dialysis at [facility] (M/W/F; SSN :[id number]  [place], LCSW

## 2020-01-27 ENCOUNTER — Other Ambulatory Visit: Payer: Self-pay

## 2020-01-27 NOTE — Patient Outreach (Signed)
  [place] [facility]) Care Management Chronic Special Needs Program    01/27/2020  Name: Jean Mcclain, DOB: November 01, 1945  MRN: [id number]   Ms. [name] is enrolled in a chronic special needs plan for Diabetes. Client admitted to hospital on 01/22/20 with pain in shoulder and knee, wounds.  Discharged to [facility] for rehab on 01/25/20.   Communicated with [facility] of clients transfer to SNF  Plan to follow clients progress at [name] and will coordinate with [facility] post acute team on clients progress and discharge plans Peter Garter RN, [facility], [place] Management Coordinator [place] [facility] [phone]

## 2020-01-29 DIAGNOSIS — M25519 Pain in unspecified shoulder: Secondary | ICD-10-CM | POA: Diagnosis not present

## 2020-01-29 DIAGNOSIS — M25561 Pain in right knee: Secondary | ICD-10-CM | POA: Diagnosis not present

## 2020-01-29 DIAGNOSIS — E785 Hyperlipidemia, unspecified: Secondary | ICD-10-CM | POA: Diagnosis not present

## 2020-01-29 DIAGNOSIS — G4733 Obstructive sleep apnea (adult) (pediatric): Secondary | ICD-10-CM | POA: Diagnosis not present

## 2020-01-29 DIAGNOSIS — L8996 Pressure-induced deep tissue damage of unspecified site: Secondary | ICD-10-CM | POA: Diagnosis not present

## 2020-01-29 DIAGNOSIS — E1165 Type 2 diabetes mellitus with hyperglycemia: Secondary | ICD-10-CM | POA: Diagnosis not present

## 2020-01-29 DIAGNOSIS — N186 End stage renal disease: Secondary | ICD-10-CM | POA: Diagnosis not present

## 2020-01-29 DIAGNOSIS — M25512 Pain in left shoulder: Secondary | ICD-10-CM | POA: Diagnosis not present

## 2020-01-29 DIAGNOSIS — I120 Hypertensive chronic kidney disease with stage 5 chronic kidney disease or end stage renal disease: Secondary | ICD-10-CM | POA: Diagnosis not present

## 2020-01-29 DIAGNOSIS — M25511 Pain in right shoulder: Secondary | ICD-10-CM | POA: Diagnosis not present

## 2020-01-29 DIAGNOSIS — I82409 Acute embolism and thrombosis of unspecified deep veins of unspecified lower extremity: Secondary | ICD-10-CM | POA: Diagnosis not present

## 2020-01-29 DIAGNOSIS — E1122 Type 2 diabetes mellitus with diabetic chronic kidney disease: Secondary | ICD-10-CM | POA: Diagnosis not present

## 2020-01-29 DIAGNOSIS — I890 Lymphedema, not elsewhere classified: Secondary | ICD-10-CM | POA: Diagnosis not present

## 2020-01-29 DIAGNOSIS — R52 Pain, unspecified: Secondary | ICD-10-CM | POA: Diagnosis not present

## 2020-01-29 DIAGNOSIS — K219 Gastro-esophageal reflux disease without esophagitis: Secondary | ICD-10-CM | POA: Diagnosis not present

## 2020-01-30 ENCOUNTER — Ambulatory Visit: Payer: HMO

## 2020-01-30 DIAGNOSIS — N186 End stage renal disease: Secondary | ICD-10-CM | POA: Diagnosis not present

## 2020-01-30 DIAGNOSIS — Z86718 Personal history of other venous thrombosis and embolism: Secondary | ICD-10-CM | POA: Diagnosis not present

## 2020-01-30 DIAGNOSIS — G4733 Obstructive sleep apnea (adult) (pediatric): Secondary | ICD-10-CM | POA: Diagnosis not present

## 2020-01-30 DIAGNOSIS — Z992 Dependence on renal dialysis: Secondary | ICD-10-CM | POA: Diagnosis not present

## 2020-01-30 DIAGNOSIS — M25511 Pain in right shoulder: Secondary | ICD-10-CM | POA: Diagnosis not present

## 2020-01-30 DIAGNOSIS — M25512 Pain in left shoulder: Secondary | ICD-10-CM | POA: Diagnosis not present

## 2020-01-30 DIAGNOSIS — M25561 Pain in right knee: Secondary | ICD-10-CM | POA: Diagnosis not present

## 2020-01-30 DIAGNOSIS — G2581 Restless legs syndrome: Secondary | ICD-10-CM | POA: Diagnosis not present

## 2020-01-30 DIAGNOSIS — I120 Hypertensive chronic kidney disease with stage 5 chronic kidney disease or end stage renal disease: Secondary | ICD-10-CM | POA: Diagnosis not present

## 2020-02-01 DIAGNOSIS — M25511 Pain in right shoulder: Secondary | ICD-10-CM | POA: Diagnosis not present

## 2020-02-01 DIAGNOSIS — G4733 Obstructive sleep apnea (adult) (pediatric): Secondary | ICD-10-CM | POA: Diagnosis not present

## 2020-02-01 DIAGNOSIS — N186 End stage renal disease: Secondary | ICD-10-CM | POA: Diagnosis not present

## 2020-02-01 DIAGNOSIS — G8929 Other chronic pain: Secondary | ICD-10-CM | POA: Diagnosis not present

## 2020-02-01 DIAGNOSIS — E1122 Type 2 diabetes mellitus with diabetic chronic kidney disease: Secondary | ICD-10-CM | POA: Diagnosis not present

## 2020-02-01 DIAGNOSIS — I82409 Acute embolism and thrombosis of unspecified deep veins of unspecified lower extremity: Secondary | ICD-10-CM | POA: Diagnosis not present

## 2020-02-05 DIAGNOSIS — R0600 Dyspnea, unspecified: Secondary | ICD-10-CM | POA: Insufficient documentation

## 2020-02-06 DIAGNOSIS — G2581 Restless legs syndrome: Secondary | ICD-10-CM | POA: Diagnosis not present

## 2020-02-06 DIAGNOSIS — R682 Dry mouth, unspecified: Secondary | ICD-10-CM | POA: Diagnosis not present

## 2020-02-06 DIAGNOSIS — L8996 Pressure-induced deep tissue damage of unspecified site: Secondary | ICD-10-CM | POA: Diagnosis not present

## 2020-02-06 DIAGNOSIS — M25561 Pain in right knee: Secondary | ICD-10-CM | POA: Diagnosis not present

## 2020-02-06 DIAGNOSIS — M25511 Pain in right shoulder: Secondary | ICD-10-CM | POA: Diagnosis not present

## 2020-02-06 DIAGNOSIS — N186 End stage renal disease: Secondary | ICD-10-CM | POA: Diagnosis not present

## 2020-02-06 DIAGNOSIS — Z992 Dependence on renal dialysis: Secondary | ICD-10-CM | POA: Diagnosis not present

## 2020-02-06 DIAGNOSIS — M25512 Pain in left shoulder: Secondary | ICD-10-CM | POA: Diagnosis not present

## 2020-02-06 DIAGNOSIS — K219 Gastro-esophageal reflux disease without esophagitis: Secondary | ICD-10-CM | POA: Diagnosis not present

## 2020-02-06 DIAGNOSIS — R52 Pain, unspecified: Secondary | ICD-10-CM | POA: Diagnosis not present

## 2020-02-07 DIAGNOSIS — N186 End stage renal disease: Secondary | ICD-10-CM | POA: Diagnosis not present

## 2020-02-07 DIAGNOSIS — Z992 Dependence on renal dialysis: Secondary | ICD-10-CM | POA: Diagnosis not present

## 2020-02-07 DIAGNOSIS — E1022 Type 1 diabetes mellitus with diabetic chronic kidney disease: Secondary | ICD-10-CM | POA: Diagnosis not present

## 2020-02-10 DIAGNOSIS — Z992 Dependence on renal dialysis: Secondary | ICD-10-CM | POA: Diagnosis not present

## 2020-02-10 DIAGNOSIS — D509 Iron deficiency anemia, unspecified: Secondary | ICD-10-CM | POA: Diagnosis not present

## 2020-02-10 DIAGNOSIS — N186 End stage renal disease: Secondary | ICD-10-CM | POA: Diagnosis not present

## 2020-02-10 DIAGNOSIS — D689 Coagulation defect, unspecified: Secondary | ICD-10-CM | POA: Diagnosis not present

## 2020-02-11 DIAGNOSIS — E785 Hyperlipidemia, unspecified: Secondary | ICD-10-CM | POA: Diagnosis not present

## 2020-02-11 DIAGNOSIS — E1122 Type 2 diabetes mellitus with diabetic chronic kidney disease: Secondary | ICD-10-CM | POA: Diagnosis not present

## 2020-02-11 DIAGNOSIS — N186 End stage renal disease: Secondary | ICD-10-CM | POA: Diagnosis not present

## 2020-02-11 DIAGNOSIS — M25561 Pain in right knee: Secondary | ICD-10-CM | POA: Diagnosis not present

## 2020-02-11 DIAGNOSIS — Z992 Dependence on renal dialysis: Secondary | ICD-10-CM | POA: Diagnosis not present

## 2020-02-11 DIAGNOSIS — M25512 Pain in left shoulder: Secondary | ICD-10-CM | POA: Diagnosis not present

## 2020-02-11 DIAGNOSIS — M25511 Pain in right shoulder: Secondary | ICD-10-CM | POA: Diagnosis not present

## 2020-02-17 ENCOUNTER — Other Ambulatory Visit: Payer: Self-pay

## 2020-02-17 NOTE — Patient Outreach (Signed)
  [place] [facility]) Care Management Chronic Special Needs Program  02/17/2020  Name: Jean Mcclain DOB: 16-Jan-1946  MRN: [id number]  Ms. Jean Mcclain is enrolled in a chronic special needs plan for Diabetes. Reviewed and updated care plan.  Client admitted to hospital on 01/22/20 with pain in shoulder and knee, wounds.  Discharged to [facility] for rehab on 01/25/20 and discharged home from nursing home on 02/13/20 [facility] notified of clients discharge home.  Goals Addressed            This Visit's Progress   . Client will report improved coping of chronic pain in the next 6 months   No change    Plan to keep follow up appointment with provider and discuss pain control as needed    . Client will verbalize understanding of treatment plan for impaired skin integrity and follow up with provider by next 6 months       Keep skin clean and dry Plan to keep pressure off of area Plan to call provider for any redness, swelling or signs of infectio    . General - Client will not be readmitted within 30 days (C-SNP)       Client discharged home from SNF on 02/13/20 [facility] to complete transition of care call/post discharge visit Please follow discharge instructions and call provider if you have any questions. Please attend all follow up appointments as scheduled. Please take your medications as prescribed. Please call [facility] as needed [phone]      Client active with [facility] for complex management [facility] will complete transition of care call/visit  Plan:  Send  outreach letter with a copy of their individualized care plan and Send individual care plan to provider Plan to review [facility]'s plan of care and will collaborated with [facility] team as needed  Chronic care management coordinator will outreach in:  One month     Glenn, Jackquline Denmark, [place] Management [phone]

## 2020-02-18 DIAGNOSIS — M76822 Posterior tibial tendinitis, left leg: Secondary | ICD-10-CM | POA: Diagnosis not present

## 2020-02-18 DIAGNOSIS — G253 Myoclonus: Secondary | ICD-10-CM | POA: Diagnosis not present

## 2020-02-18 DIAGNOSIS — K436 Other and unspecified ventral hernia with obstruction, without gangrene: Secondary | ICD-10-CM | POA: Diagnosis not present

## 2020-02-18 DIAGNOSIS — G894 Chronic pain syndrome: Secondary | ICD-10-CM | POA: Diagnosis not present

## 2020-02-18 DIAGNOSIS — M170 Bilateral primary osteoarthritis of knee: Secondary | ICD-10-CM | POA: Diagnosis not present

## 2020-02-18 DIAGNOSIS — Z794 Long term (current) use of insulin: Secondary | ICD-10-CM | POA: Diagnosis not present

## 2020-02-18 DIAGNOSIS — E559 Vitamin D deficiency, unspecified: Secondary | ICD-10-CM | POA: Diagnosis not present

## 2020-02-18 DIAGNOSIS — I132 Hypertensive heart and chronic kidney disease with heart failure and with stage 5 chronic kidney disease, or end stage renal disease: Secondary | ICD-10-CM | POA: Diagnosis not present

## 2020-02-18 DIAGNOSIS — M25712 Osteophyte, left shoulder: Secondary | ICD-10-CM | POA: Diagnosis not present

## 2020-02-18 DIAGNOSIS — Z7901 Long term (current) use of anticoagulants: Secondary | ICD-10-CM | POA: Diagnosis not present

## 2020-02-18 DIAGNOSIS — K219 Gastro-esophageal reflux disease without esophagitis: Secondary | ICD-10-CM | POA: Diagnosis not present

## 2020-02-18 DIAGNOSIS — S42142D Displaced fracture of glenoid cavity of scapula, left shoulder, subsequent encounter for fracture with routine healing: Secondary | ICD-10-CM | POA: Diagnosis not present

## 2020-02-18 DIAGNOSIS — I5033 Acute on chronic diastolic (congestive) heart failure: Secondary | ICD-10-CM | POA: Diagnosis not present

## 2020-02-18 DIAGNOSIS — M19012 Primary osteoarthritis, left shoulder: Secondary | ICD-10-CM | POA: Diagnosis not present

## 2020-02-18 DIAGNOSIS — M545 Low back pain: Secondary | ICD-10-CM | POA: Diagnosis not present

## 2020-02-18 DIAGNOSIS — G2581 Restless legs syndrome: Secondary | ICD-10-CM | POA: Diagnosis not present

## 2020-02-18 DIAGNOSIS — M19011 Primary osteoarthritis, right shoulder: Secondary | ICD-10-CM | POA: Diagnosis not present

## 2020-02-18 DIAGNOSIS — E1122 Type 2 diabetes mellitus with diabetic chronic kidney disease: Secondary | ICD-10-CM | POA: Diagnosis not present

## 2020-02-18 DIAGNOSIS — G4733 Obstructive sleep apnea (adult) (pediatric): Secondary | ICD-10-CM | POA: Diagnosis not present

## 2020-02-18 DIAGNOSIS — I890 Lymphedema, not elsewhere classified: Secondary | ICD-10-CM | POA: Diagnosis not present

## 2020-02-18 DIAGNOSIS — M1030 Gout due to renal impairment, unspecified site: Secondary | ICD-10-CM | POA: Diagnosis not present

## 2020-02-18 DIAGNOSIS — M8580 Other specified disorders of bone density and structure, unspecified site: Secondary | ICD-10-CM | POA: Diagnosis not present

## 2020-02-18 DIAGNOSIS — Z992 Dependence on renal dialysis: Secondary | ICD-10-CM | POA: Diagnosis not present

## 2020-02-18 DIAGNOSIS — N186 End stage renal disease: Secondary | ICD-10-CM | POA: Diagnosis not present

## 2020-02-18 DIAGNOSIS — I82432 Acute embolism and thrombosis of left popliteal vein: Secondary | ICD-10-CM | POA: Diagnosis not present

## 2020-02-25 DIAGNOSIS — I5033 Acute on chronic diastolic (congestive) heart failure: Secondary | ICD-10-CM | POA: Diagnosis not present

## 2020-02-25 DIAGNOSIS — I82432 Acute embolism and thrombosis of left popliteal vein: Secondary | ICD-10-CM | POA: Diagnosis not present

## 2020-02-25 DIAGNOSIS — G253 Myoclonus: Secondary | ICD-10-CM | POA: Diagnosis not present

## 2020-02-25 DIAGNOSIS — S42142D Displaced fracture of glenoid cavity of scapula, left shoulder, subsequent encounter for fracture with routine healing: Secondary | ICD-10-CM | POA: Diagnosis not present

## 2020-02-25 DIAGNOSIS — Z794 Long term (current) use of insulin: Secondary | ICD-10-CM | POA: Diagnosis not present

## 2020-02-25 DIAGNOSIS — G4733 Obstructive sleep apnea (adult) (pediatric): Secondary | ICD-10-CM | POA: Diagnosis not present

## 2020-02-25 DIAGNOSIS — M25712 Osteophyte, left shoulder: Secondary | ICD-10-CM | POA: Diagnosis not present

## 2020-02-25 DIAGNOSIS — M76822 Posterior tibial tendinitis, left leg: Secondary | ICD-10-CM | POA: Diagnosis not present

## 2020-02-25 DIAGNOSIS — K436 Other and unspecified ventral hernia with obstruction, without gangrene: Secondary | ICD-10-CM | POA: Diagnosis not present

## 2020-02-25 DIAGNOSIS — E559 Vitamin D deficiency, unspecified: Secondary | ICD-10-CM | POA: Diagnosis not present

## 2020-02-25 DIAGNOSIS — G2581 Restless legs syndrome: Secondary | ICD-10-CM | POA: Diagnosis not present

## 2020-02-25 DIAGNOSIS — M19012 Primary osteoarthritis, left shoulder: Secondary | ICD-10-CM | POA: Diagnosis not present

## 2020-02-25 DIAGNOSIS — M170 Bilateral primary osteoarthritis of knee: Secondary | ICD-10-CM | POA: Diagnosis not present

## 2020-02-25 DIAGNOSIS — E1122 Type 2 diabetes mellitus with diabetic chronic kidney disease: Secondary | ICD-10-CM | POA: Diagnosis not present

## 2020-02-25 DIAGNOSIS — Z7901 Long term (current) use of anticoagulants: Secondary | ICD-10-CM | POA: Diagnosis not present

## 2020-02-25 DIAGNOSIS — I890 Lymphedema, not elsewhere classified: Secondary | ICD-10-CM | POA: Diagnosis not present

## 2020-02-25 DIAGNOSIS — I132 Hypertensive heart and chronic kidney disease with heart failure and with stage 5 chronic kidney disease, or end stage renal disease: Secondary | ICD-10-CM | POA: Diagnosis not present

## 2020-02-25 DIAGNOSIS — Z992 Dependence on renal dialysis: Secondary | ICD-10-CM | POA: Diagnosis not present

## 2020-02-25 DIAGNOSIS — M545 Low back pain: Secondary | ICD-10-CM | POA: Diagnosis not present

## 2020-02-25 DIAGNOSIS — M8580 Other specified disorders of bone density and structure, unspecified site: Secondary | ICD-10-CM | POA: Diagnosis not present

## 2020-02-25 DIAGNOSIS — G894 Chronic pain syndrome: Secondary | ICD-10-CM | POA: Diagnosis not present

## 2020-02-25 DIAGNOSIS — K219 Gastro-esophageal reflux disease without esophagitis: Secondary | ICD-10-CM | POA: Diagnosis not present

## 2020-02-25 DIAGNOSIS — N186 End stage renal disease: Secondary | ICD-10-CM | POA: Diagnosis not present

## 2020-02-25 DIAGNOSIS — M1030 Gout due to renal impairment, unspecified site: Secondary | ICD-10-CM | POA: Diagnosis not present

## 2020-02-25 DIAGNOSIS — M19011 Primary osteoarthritis, right shoulder: Secondary | ICD-10-CM | POA: Diagnosis not present

## 2020-02-26 ENCOUNTER — Other Ambulatory Visit: Payer: Self-pay

## 2020-02-26 NOTE — Patient Outreach (Signed)
.   [place] [facility]) Care Management Chronic Special Needs Program   02/26/2020  Name: Jean Mcclain, DOB: [birth year]  MRN: [id number]  The client was discussed in today's interdisciplinary care team meeting.  The following issues were discussed:  Client's needs, Changes in health status, Coordination of care, Care transitions and Issues/barriers to care  Participants present:   Thea Silversmith, MSN, RN, CCM   Shamell Hittle RN,BSN,CCM, CDE  Quinn Plowman RN, BSN, CCM Jacqlyn Larsen RN, BSN, CCM Maryella Shivers, MD Cherre Huger, MD  Karrie Meres PharmD, RPh Bary Castilla, RN, BSN, MS, CCM Coralie Carpen, MD [place] [facility] Roney Mans RD, LDN [facility] Karma Lew RN   Recommendations:  [facility] team to follow up on client's shoulder pain [employer] Health Coach to outreach client for clients desire to lose weight   Plan:  RNCM to coordinate with [place] as indicated  Follow-up: as scheduled  per tier level  [place], Jackquline Denmark, [place] Management [phone]

## 2020-03-06 DIAGNOSIS — I5033 Acute on chronic diastolic (congestive) heart failure: Secondary | ICD-10-CM | POA: Diagnosis not present

## 2020-03-06 DIAGNOSIS — J9602 Acute respiratory failure with hypercapnia: Secondary | ICD-10-CM | POA: Diagnosis not present

## 2020-03-09 DIAGNOSIS — N186 End stage renal disease: Secondary | ICD-10-CM | POA: Diagnosis not present

## 2020-03-09 DIAGNOSIS — E1022 Type 1 diabetes mellitus with diabetic chronic kidney disease: Secondary | ICD-10-CM | POA: Diagnosis not present

## 2020-03-09 DIAGNOSIS — Z992 Dependence on renal dialysis: Secondary | ICD-10-CM | POA: Diagnosis not present

## 2020-03-10 DIAGNOSIS — M170 Bilateral primary osteoarthritis of knee: Secondary | ICD-10-CM | POA: Diagnosis not present

## 2020-03-10 DIAGNOSIS — M8580 Other specified disorders of bone density and structure, unspecified site: Secondary | ICD-10-CM | POA: Diagnosis not present

## 2020-03-10 DIAGNOSIS — I5033 Acute on chronic diastolic (congestive) heart failure: Secondary | ICD-10-CM | POA: Diagnosis not present

## 2020-03-10 DIAGNOSIS — M545 Low back pain: Secondary | ICD-10-CM | POA: Diagnosis not present

## 2020-03-10 DIAGNOSIS — G253 Myoclonus: Secondary | ICD-10-CM | POA: Diagnosis not present

## 2020-03-10 DIAGNOSIS — N186 End stage renal disease: Secondary | ICD-10-CM | POA: Diagnosis not present

## 2020-03-10 DIAGNOSIS — K436 Other and unspecified ventral hernia with obstruction, without gangrene: Secondary | ICD-10-CM | POA: Diagnosis not present

## 2020-03-10 DIAGNOSIS — M19011 Primary osteoarthritis, right shoulder: Secondary | ICD-10-CM | POA: Diagnosis not present

## 2020-03-10 DIAGNOSIS — M25712 Osteophyte, left shoulder: Secondary | ICD-10-CM | POA: Diagnosis not present

## 2020-03-10 DIAGNOSIS — M1030 Gout due to renal impairment, unspecified site: Secondary | ICD-10-CM | POA: Diagnosis not present

## 2020-03-10 DIAGNOSIS — S42142D Displaced fracture of glenoid cavity of scapula, left shoulder, subsequent encounter for fracture with routine healing: Secondary | ICD-10-CM | POA: Diagnosis not present

## 2020-03-10 DIAGNOSIS — Z794 Long term (current) use of insulin: Secondary | ICD-10-CM | POA: Diagnosis not present

## 2020-03-10 DIAGNOSIS — I132 Hypertensive heart and chronic kidney disease with heart failure and with stage 5 chronic kidney disease, or end stage renal disease: Secondary | ICD-10-CM | POA: Diagnosis not present

## 2020-03-10 DIAGNOSIS — Z7901 Long term (current) use of anticoagulants: Secondary | ICD-10-CM | POA: Diagnosis not present

## 2020-03-10 DIAGNOSIS — Z992 Dependence on renal dialysis: Secondary | ICD-10-CM | POA: Diagnosis not present

## 2020-03-10 DIAGNOSIS — M19012 Primary osteoarthritis, left shoulder: Secondary | ICD-10-CM | POA: Diagnosis not present

## 2020-03-10 DIAGNOSIS — I890 Lymphedema, not elsewhere classified: Secondary | ICD-10-CM | POA: Diagnosis not present

## 2020-03-10 DIAGNOSIS — E559 Vitamin D deficiency, unspecified: Secondary | ICD-10-CM | POA: Diagnosis not present

## 2020-03-10 DIAGNOSIS — G894 Chronic pain syndrome: Secondary | ICD-10-CM | POA: Diagnosis not present

## 2020-03-10 DIAGNOSIS — M76822 Posterior tibial tendinitis, left leg: Secondary | ICD-10-CM | POA: Diagnosis not present

## 2020-03-10 DIAGNOSIS — G4733 Obstructive sleep apnea (adult) (pediatric): Secondary | ICD-10-CM | POA: Diagnosis not present

## 2020-03-10 DIAGNOSIS — I82432 Acute embolism and thrombosis of left popliteal vein: Secondary | ICD-10-CM | POA: Diagnosis not present

## 2020-03-10 DIAGNOSIS — K219 Gastro-esophageal reflux disease without esophagitis: Secondary | ICD-10-CM | POA: Diagnosis not present

## 2020-03-10 DIAGNOSIS — E1122 Type 2 diabetes mellitus with diabetic chronic kidney disease: Secondary | ICD-10-CM | POA: Diagnosis not present

## 2020-03-10 DIAGNOSIS — G2581 Restless legs syndrome: Secondary | ICD-10-CM | POA: Diagnosis not present

## 2020-03-11 DIAGNOSIS — Z992 Dependence on renal dialysis: Secondary | ICD-10-CM | POA: Diagnosis not present

## 2020-03-11 DIAGNOSIS — N186 End stage renal disease: Secondary | ICD-10-CM | POA: Diagnosis not present

## 2020-03-11 DIAGNOSIS — D689 Coagulation defect, unspecified: Secondary | ICD-10-CM | POA: Diagnosis not present

## 2020-03-11 DIAGNOSIS — D631 Anemia in chronic kidney disease: Secondary | ICD-10-CM | POA: Diagnosis not present

## 2020-03-11 DIAGNOSIS — D509 Iron deficiency anemia, unspecified: Secondary | ICD-10-CM | POA: Diagnosis not present

## 2020-03-16 ENCOUNTER — Other Ambulatory Visit: Payer: Self-pay

## 2020-03-16 NOTE — Patient Outreach (Signed)
  [place] [facility]) Care Management Chronic Special Needs Program  03/16/2020  Name: ZADAYA CUADRA DOB: September 28, 1946  MRN: [id number]  Ms. Layia Walla is enrolled in a chronic special needs plan for Diabetes. Client called with no answer No answer and HIPAA compliant message left. 1st attempt Plan for 2nd outreach call in one week Chronic care management coordinator will attempt outreach in one week.   Peter Garter RN, Jackquline Denmark, CDE Chronic Care Management Coordinator [place] [facility] Management [phone]

## 2020-03-24 ENCOUNTER — Other Ambulatory Visit: Payer: Self-pay

## 2020-03-24 NOTE — Patient Outreach (Signed)
  [place] [facility]) Care Management Chronic Special Needs Program  03/24/2020  Name: Jean Mcclain DOB: 08/28/1946  MRN: [id number]  [name] is enrolled in a chronic special needs plan for Diabetes. Client called with no answer No answer and HIPAA compliant message left. 2nd attempt Plan for 3rd outreach call in one week Chronic care management coordinator will attempt outreach in one week.   Peter Garter RN, Jackquline Denmark, CDE Chronic Care Management Coordinator [place] [facility] [phone]

## 2020-03-28 DIAGNOSIS — Z9981 Dependence on supplemental oxygen: Secondary | ICD-10-CM | POA: Diagnosis not present

## 2020-03-28 DIAGNOSIS — Z794 Long term (current) use of insulin: Secondary | ICD-10-CM | POA: Diagnosis not present

## 2020-03-28 DIAGNOSIS — L89322 Pressure ulcer of left buttock, stage 2: Secondary | ICD-10-CM | POA: Diagnosis not present

## 2020-03-28 DIAGNOSIS — Z7901 Long term (current) use of anticoagulants: Secondary | ICD-10-CM | POA: Diagnosis not present

## 2020-03-28 DIAGNOSIS — G4733 Obstructive sleep apnea (adult) (pediatric): Secondary | ICD-10-CM | POA: Diagnosis not present

## 2020-03-28 DIAGNOSIS — Z86718 Personal history of other venous thrombosis and embolism: Secondary | ICD-10-CM | POA: Diagnosis not present

## 2020-03-28 DIAGNOSIS — E1165 Type 2 diabetes mellitus with hyperglycemia: Secondary | ICD-10-CM | POA: Diagnosis not present

## 2020-03-28 DIAGNOSIS — S3141XD Laceration without foreign body of vagina and vulva, subsequent encounter: Secondary | ICD-10-CM | POA: Diagnosis not present

## 2020-03-28 DIAGNOSIS — E785 Hyperlipidemia, unspecified: Secondary | ICD-10-CM | POA: Diagnosis not present

## 2020-03-28 DIAGNOSIS — I120 Hypertensive chronic kidney disease with stage 5 chronic kidney disease or end stage renal disease: Secondary | ICD-10-CM | POA: Diagnosis not present

## 2020-03-28 DIAGNOSIS — I890 Lymphedema, not elsewhere classified: Secondary | ICD-10-CM | POA: Diagnosis not present

## 2020-03-28 DIAGNOSIS — G2581 Restless legs syndrome: Secondary | ICD-10-CM | POA: Diagnosis not present

## 2020-03-28 DIAGNOSIS — L219 Seborrheic dermatitis, unspecified: Secondary | ICD-10-CM | POA: Diagnosis not present

## 2020-03-28 DIAGNOSIS — N186 End stage renal disease: Secondary | ICD-10-CM | POA: Diagnosis not present

## 2020-03-28 DIAGNOSIS — L89212 Pressure ulcer of right hip, stage 2: Secondary | ICD-10-CM | POA: Diagnosis not present

## 2020-03-28 DIAGNOSIS — M19011 Primary osteoarthritis, right shoulder: Secondary | ICD-10-CM | POA: Diagnosis not present

## 2020-03-28 DIAGNOSIS — K219 Gastro-esophageal reflux disease without esophagitis: Secondary | ICD-10-CM | POA: Diagnosis not present

## 2020-03-28 DIAGNOSIS — E1122 Type 2 diabetes mellitus with diabetic chronic kidney disease: Secondary | ICD-10-CM | POA: Diagnosis not present

## 2020-03-28 DIAGNOSIS — Z992 Dependence on renal dialysis: Secondary | ICD-10-CM | POA: Diagnosis not present

## 2020-03-28 DIAGNOSIS — R32 Unspecified urinary incontinence: Secondary | ICD-10-CM | POA: Diagnosis not present

## 2020-03-28 DIAGNOSIS — Z96651 Presence of right artificial knee joint: Secondary | ICD-10-CM | POA: Diagnosis not present

## 2020-03-30 ENCOUNTER — Other Ambulatory Visit: Payer: Self-pay

## 2020-03-30 NOTE — Patient Outreach (Signed)
[place] [facility]) Care Management Chronic Special Needs Program  03/30/2020  Name: Jean Mcclain DOB: May 17, 1946  MRN: [id number]  Ms. Jean Mcclain is enrolled in a chronic special needs plan for  Diabetes.  Client called with no answer and HIPAA compliant message left.  3rd attempt  The client's individualized care plan was developed based on available data.   Assessment Goals Addressed              This Visit's Progress   .  Client will report improved coping of chronic pain in the next 6 months   On track     Plan to keep follow up appointment with provider and discuss pain control as needed    .  Client will use Assistive Devices as needed and verbalize understanding of device use   On track     Reports no issues with her glucometer and other home equipment    .  Client will verbalize knowledge of self management of Hypertension as evidences by BP reading of 140/90 or less; or as defined by provider   On track     Reports B/P less than 140/90 on provider visits and at dialysis Plan to check B/P regularly Take B/P medications as ordered Plan to follow a low salt renal diet  EMMI: High Blood Pressure(Hypertension) What you can do    .  Client will verbalize understanding of treatment plan for impaired skin integrity and follow up with provider by next 6 months   On track     Keep skin clean and dry Plan to keep pressure off of area Plan to call provider for any redness, swelling or signs of infectio    .  Diabetes Patient stated goal to talk with health coach about weight loss in the next 6 months (pt-stated)   Not on track     Client did not respond to 3 outreach calls from[employer] health coach      .  COMPLETED: General - Client will not be readmitted within 30 days (C-SNP)        Client discharged home from SNF on 02/13/20 Please call [facility] as needed [phone]  No readmission or ED visits post 30 days discharge Goal completed  03/30/20    .  HEMOGLOBIN A1C < 7        Last result 6.3% 01/22/20 Plan to check blood sugars as directed with goals fasting or 1 1/2 hours after eating with goal of 80-130 fasting and 180 or less after meals Plan to follow a low carbohydrate, low salt diet, watch portion sizes and avoid sugar sweetened drinks    .  Maintain timely refills of diabetic medication as prescribed within the year .   On track     Maintaining timely refills of medications per dispense report Reinforced importance of getting medications refilled on time    .  Obtain annual  Lipid Profile, LDL-C   On track     Last completed 02/04/19 LDL 68 The goal for LDL is less than 70 mg/dL as you are at high risk for complications Plan to take statin as ordered    .  Obtain Annual Eye (retinal)  Exam    On track     Completed by [facility] in 2020 Plan to have a dilated eye exam every year    .  Obtain Hemoglobin A1C at least 2 times per year   On track     Last completed 01/22/20 It  is important to have your Hemoglobin A1C checked every 6 months if you are at goal and every 3 months if you are not at goal    .  Visit Primary Care Provider or Endocrinologist at least 2 times per year    On track     Primary care provider 11/25/19, 02/18/20 Endocrinologist in 09/16/19 next visit scheduled in June Please schedule your annual wellness visit       Client active with [facility] for complex management Next [facility] provider visit scheduled for 04/17/20 Plan:  . Send unsuccessful outreach letter with a copy of individualized care plan to client . Send individualized care plan to provider Plan to review [facility]'s plan of care and will collaborated with [facility] team as needed  Chronic care management coordinator will attempt outreach as previously scheduled per tier level  [place], Jackquline [place], [place] Management [phone]

## 2020-03-31 ENCOUNTER — Other Ambulatory Visit: Payer: Self-pay

## 2020-03-31 ENCOUNTER — Encounter: Payer: Self-pay | Admitting: Sports Medicine

## 2020-03-31 ENCOUNTER — Ambulatory Visit (INDEPENDENT_AMBULATORY_CARE_PROVIDER_SITE_OTHER): Payer: HMO | Admitting: Sports Medicine

## 2020-03-31 DIAGNOSIS — E0842 Diabetes mellitus due to underlying condition with diabetic polyneuropathy: Secondary | ICD-10-CM

## 2020-03-31 DIAGNOSIS — I890 Lymphedema, not elsewhere classified: Secondary | ICD-10-CM | POA: Diagnosis not present

## 2020-03-31 DIAGNOSIS — M79676 Pain in unspecified toe(s): Secondary | ICD-10-CM | POA: Diagnosis not present

## 2020-03-31 DIAGNOSIS — B351 Tinea unguium: Secondary | ICD-10-CM

## 2020-03-31 NOTE — Progress Notes (Signed)
Patient ID: Jean Mcclain, female   DOB: [birth year], 74 y.o.   MRN: [id number]   Subjective: Jean Mcclain is a 74 y.o. female patient with history of diabetes who returns to office today complaining of long, painful nails; unable to trim. Patient reports that she lives at [facility] now and they have been helping with her care but has issues with decubitus ulcers on her sacral area that is painful unable to sit very long due to pain.  Patient is also awaiting diabetic shoes.  Patient denies any other pedal complaints at this time.  Patient Active Problem List   Diagnosis Date Noted  . Dyspnea, unspecified 02/05/2020  . Intractable pain 01/24/2020  . Bilateral shoulder pain 01/23/2020  . Right knee pain 01/23/2020  . Uncontrolled type 2 diabetes mellitus with hyperglycemia, with long-term current use of insulin ([place]) 01/23/2020  . Pressure injury of skin 01/22/2020  . Mild protein-calorie malnutrition ([place]) 12/16/2019  . Personal history of anaphylaxis 12/05/2019  . Pruritus, unspecified 12/05/2019  . ESRD on hemodialysis ([place]) 11/20/2019  . Myoclonus, segmental 11/20/2019  . Allergy, unspecified, initial encounter 07/29/2019  . Anemia in chronic kidney disease 07/15/2019  . Coagulation defect, unspecified ([place]) 07/13/2019  . Iron deficiency anemia, unspecified 07/11/2019  . Encounter for immunization 07/06/2019  . End stage renal disease ([place]) 07/02/2019  . Gastro-esophageal reflux disease without esophagitis 07/02/2019  . Low back pain 07/02/2019  . Morbid (severe) obesity due to excess calories ([place]) 07/02/2019  . Other specified disorders of bone density and structure, unspecified site 07/02/2019  . Secondary hyperparathyroidism of renal origin ([place]) 07/02/2019  . Unspecified osteoarthritis, unspecified site 07/02/2019  . OSA (obstructive sleep apnea) 03/13/2019  . Chronic acquired lymphedema 11/05/2018  . Acute deep vein thrombosis (DVT) of popliteal vein of left lower  extremity ([place]) 03/11/2017  . Insulin-requiring or dependent type II diabetes mellitus ([place]) 03/11/2017  . Essential hypertension 03/11/2017  . Impingement syndrome of left ankle 01/31/2017  . Posterior tibial tendinitis, left leg 01/31/2017  . Incarcerated ventral hernia 01/02/2013   Current Outpatient Medications on File Prior to Visit  Medication Sig Dispense Refill  . acetaminophen (TYLENOL) 500 MG tablet Take 1,000 mg by mouth 3 (three) times daily as needed for moderate pain or headache.    . albuterol (VENTOLIN HFA) 108 (90 Base) MCG/ACT inhaler Inhale into the lungs.    . ALPRAZolam (XANAX) 0.25 MG tablet Take by mouth.    Marland Kitchen apixaban (ELIQUIS) 2.5 MG TABS tablet Take 2.5 mg by mouth 2 (two) times daily.    . B-D INS SYR ULTRAFINE 1CC/30G 30G X 1/2" 1 ML MISC 1 each by Other route daily.     . Blood Glucose Monitoring Suppl (ONETOUCH VERIO FLEX SYSTEM) w/Device KIT     . calcium acetate (PHOSLO) 667 MG tablet Take 667 mg by mouth 3 (three) times daily.    . carbidopa-levodopa (SINEMET IR) 10-100 MG tablet Take 1 tablet by mouth in the morning and at bedtime.     . colchicine 0.6 MG tablet Take 0.6 mg by mouth daily as needed (gout flare).     . cyclobenzaprine (FLEXERIL) 10 MG tablet Take 10 mg by mouth at bedtime.     Marland Kitchen doxercalciferol (HECTOROL) 4 MCG/2ML injection Inject 1 mL (2 mcg total) into the vein every Monday, Wednesday, and Friday with hemodialysis. 2 mL   . febuxostat (ULORIC) 40 MG tablet Take 40 mg by mouth daily.    Marland Kitchen gabapentin (NEURONTIN) 300 MG capsule  Take 300 mg by mouth at bedtime.    Marland Kitchen glucose blood test strip 1 each by Other route daily.     Marland Kitchen HYDROcodone-Acetaminophen 5-300 MG TABS Take by mouth.    . hydrOXYzine (ATARAX/VISTARIL) 10 MG tablet Take 1 tablet (10 mg total) by mouth 2 (two) times daily as needed. (Patient taking differently: Take 10 mg by mouth 2 (two) times daily as needed for itching or anxiety. ) 60 tablet 5  . hydrOXYzine (ATARAX/VISTARIL) 25  MG tablet Take 25 mg by mouth 3 (three) times daily.    . insulin NPH Human (NOVOLIN N) 100 UNIT/ML injection Inject 0.2 mLs (20 Units total) into the skin 2 (two) times daily at 8 am and 10 pm. 10 mL 11  . insulin regular (NOVOLIN R RELION) 100 units/mL injection Inject 10-25 Units into the skin 3 (three) times daily before meals. Take 10 units if  cbg is between 100 to 151 if cbg is over 300 take 25 units per patient    . Lancets (ONETOUCH DELICA PLUS [id number]) MISC 1 each by Other route daily.     Marland Kitchen lidocaine-prilocaine (EMLA) cream Apply 1 application topically See admin instructions. 1 hour before dialysis to port    . lovastatin (MEVACOR) 40 MG tablet Take 40 mg by mouth daily.    . metoprolol succinate (TOPROL-XL) 25 MG 24 hr tablet Take 25 mg by mouth daily.    . multivitamin (RENA-VIT) TABS tablet Take 1 tablet by mouth daily.    Marland Kitchen omeprazole (PRILOSEC) 20 MG capsule Take 20 mg by mouth daily before breakfast.     . oxyCODONE (OXY IR/ROXICODONE) 5 MG immediate release tablet     . polyethylene glycol (MIRALAX / GLYCOLAX) 17 g packet Take 17 g by mouth daily as needed for mild constipation. 14 each 0  . pravastatin (PRAVACHOL) 40 MG tablet Take 1 tablet (40 mg total) by mouth daily at 6 PM. 30 tablet 0  . predniSONE (DELTASONE) 20 MG tablet Take 20 mg by mouth daily.    Marland Kitchen rOPINIRole (REQUIP XL) 8 MG 24 hr tablet Take 1 tablet (8 mg total) by mouth at bedtime. 90 tablet 1  . triamcinolone cream (KENALOG) 0.1 % Apply 1 application topically 2 (two) times daily as needed (itching).      No current facility-administered medications on file prior to visit.   No Known Allergies  Recent Results (from the past 2160 hour(s))  Basic metabolic panel     Status: Abnormal   Collection Time: 01/22/20  1:03 PM  Result Value Ref Range   Sodium 143 135 - 145 mmol/L   Potassium 4.5 3.5 - 5.1 mmol/L   Chloride 103 98 - 111 mmol/L   CO2 28 22 - 32 mmol/L   Glucose, Bld 174 (H) 70 - 99 mg/dL     Comment: Glucose reference range applies only to samples taken after fasting for at least 8 hours.   BUN 98 (H) 8 - 23 mg/dL   Creatinine, Ser 5.39 (H) 0.44 - 1.00 mg/dL   Calcium 8.2 (L) 8.9 - 10.3 mg/dL   GFR calc non Af Amer 7 (L) >60 mL/min   GFR calc Af Amer 8 (L) >60 mL/min   Anion gap 12 5 - 15    Comment: Performed at [facility] [address], [place], [place] [address]  CBC     Status: Abnormal   Collection Time: 01/22/20  1:03 PM  Result Value Ref Range  WBC 11.8 (H) 4.0 - 10.5 K/uL   RBC 3.66 (L) 3.87 - 5.11 MIL/uL   Hemoglobin 10.6 (L) 12.0 - 15.0 g/dL   HCT 35.2 (L) 36 - 46 %   MCV 96.2 80.0 - 100.0 fL   MCH 29.0 26.0 - 34.0 pg   MCHC 30.1 30.0 - 36.0 g/dL   RDW 18.4 (H) 11.5 - 15.5 %   Platelets 272 150 - 400 K/uL   nRBC 0.0 0.0 - 0.2 %    Comment: Performed at [facility] [address] [place], [place] [address]  Hemoglobin A1c     Status: Abnormal   Collection Time: 01/22/20  1:03 PM  Result Value Ref Range   Hgb A1c MFr Bld 6.3 (H) 4.8 - 5.6 %    Comment: (NOTE) Pre diabetes:          5.7%-6.4% Diabetes:              >6.4% Glycemic control for   <7.0% adults with diabetes    Mean Plasma Glucose 134.11 mg/dL    Comment: Performed at [facility] [address] [place], [place] [address]  C-reactive protein     Status: Abnormal   Collection Time: 01/23/20  1:05 AM  Result Value Ref Range   CRP 5.4 (H) <1.0 mg/dL    Comment: Performed at [facility] [address], [place], [place] [address]  TSH     Status: None   Collection Time: 01/23/20  1:05 AM  Result Value Ref Range   TSH 2.682 0.350 - 4.500 uIU/mL    Comment: Performed by a 3rd Generation assay with a functional sensitivity of <=0.01 uIU/mL. Performed at [facility], [place] [address], [place], [place] [address]   CBG monitoring, ED     Status: Abnormal   Collection Time: 01/23/20  1:06 AM  Result Value Ref Range   Glucose-Capillary 113 (H) 70 - 99 mg/dL     Comment: Glucose reference range applies only to samples taken after fasting for at least 8 hours.  SARS Coronavirus 2 by RT PCR     Status: None   Collection Time: 01/23/20  1:40 AM  Result Value Ref Range   SARS Coronavirus 2 NEGATIVE NEGATIVE    Comment: (NOTE) Result indicates the ABSENCE of SARS-CoV-2 RNA in the patient specimen.  The lowest concentration of SARS-CoV-2 viral copies this assay can detect in nasopharyngeal swab specimens is 500 copies / mL.  A negative result does not preclude SARS-CoV-2 infection and should not be used as the sole basis for patient management decisions. A negative result may occur with improper specimen collection / handling, submission of a specimen other than nasopharyngeal swab, presence of viral mutation(s) within the areas targeted by this assay, and inadequate number of viral copies (<500 copies / mL) present.  Negative results must be combined with clinical observations, patient history, and epidemiological information.  The expected result is NEGATIVE.  Patient Fact Sheet:  BlogSelections.co.uk   Provider Fact Sheet:  https://lucas.com/   This test is not yet approved or cleared by the [place] FDA and  has been authorized for  detection and/or diagnosis of SARS-CoV-2 by FDA under an Emergency Use Authorization (EUA).  This EUA will remain in effect (meaning this test can be used) for the duration of  the COVID-19 declaration under Section 564(b)(1) of the Act, 21 U.S.C. section 360bbb-3(b)(1), unless the authorization is terminated or revoked sooner Performed at  [facility], [place] [address] [place], [place] [address]   Basic metabolic panel     Status: Abnormal   Collection Time: 01/23/20  1:45 AM  Result Value Ref Range   Sodium 142 135 - 145 mmol/L   Potassium 4.8 3.5 - 5.1 mmol/L   Chloride 103 98 - 111 mmol/L   CO2 26 22 - 32 mmol/L   Glucose, Bld 128 (H) 70 - 99  mg/dL    Comment: Glucose reference range applies only to samples taken after fasting for at least 8 hours.   BUN 97 (H) 8 - 23 mg/dL   Creatinine, Ser 5.27 (H) 0.44 - 1.00 mg/dL   Calcium 7.7 (L) 8.9 - 10.3 mg/dL   GFR calc non Af Amer 7 (L) >60 mL/min   GFR calc Af Amer 9 (L) >60 mL/min   Anion gap 13 5 - 15    Comment: Performed at [facility] [address] [place], [place] [address]  CBC with Differential/Platelet     Status: Abnormal   Collection Time: 01/23/20  1:45 AM  Result Value Ref Range   WBC 12.4 (H) 4.0 - 10.5 K/uL   RBC 3.64 (L) 3.87 - 5.11 MIL/uL   Hemoglobin 10.7 (L) 12.0 - 15.0 g/dL   HCT 34.6 (L) 36 - 46 %   MCV 95.1 80.0 - 100.0 fL   MCH 29.4 26.0 - 34.0 pg   MCHC 30.9 30.0 - 36.0 g/dL   RDW 18.6 (H) 11.5 - 15.5 %   Platelets 251 150 - 400 K/uL   nRBC 0.0 0.0 - 0.2 %   Neutrophils Relative % 76 %   Neutro Abs 9.5 (H) 1.7 - 7.7 K/uL   Lymphocytes Relative 11 %   Lymphs Abs 1.3 0.7 - 4.0 K/uL   Monocytes Relative 10 %   Monocytes Absolute 1.2 (H) 0 - 1 K/uL   Eosinophils Relative 2 %   Eosinophils Absolute 0.2 0 - 0 K/uL   Basophils Relative 0 %   Basophils Absolute 0.1 0 - 0 K/uL   Immature Granulocytes 1 %   Abs Immature Granulocytes 0.11 (H) 0.00 - 0.07 K/uL    Comment: Performed at [facility] [address] [place], [place] [address]  Glucose, capillary     Status: Abnormal   Collection Time: 01/23/20  5:09 AM  Result Value Ref Range   Glucose-Capillary 111 (H) 70 - 99 mg/dL    Comment: Glucose reference range applies only to samples taken after fasting for at least 8 hours.  Glucose, capillary     Status: Abnormal   Collection Time: 01/23/20  7:47 AM  Result Value Ref Range   Glucose-Capillary 127 (H) 70 - 99 mg/dL    Comment: Glucose reference range applies only to samples taken after fasting for at least 8 hours.  Glucose, capillary     Status: Abnormal   Collection Time: 01/23/20 11:42 AM  Result Value Ref Range    Glucose-Capillary 117 (H) 70 - 99 mg/dL    Comment: Glucose reference range applies only to samples taken after fasting for at least 8 hours.  Glucose, capillary     Status: Abnormal   Collection Time: 01/23/20  8:10 PM  Result Value Ref Range   Glucose-Capillary 142 (H) 70 - 99 mg/dL    Comment: Glucose reference range applies only to samples taken after fasting for at least 8 hours.  Magnesium     Status: Abnormal  Collection Time: 01/24/20  1:58 AM  Result Value Ref Range   Magnesium 1.4 (L) 1.7 - 2.4 mg/dL    Comment: Performed at [facility] [address], [place], [place] [address]  CBC WITH DIFFERENTIAL     Status: Abnormal   Collection Time: 01/24/20  1:58 AM  Result Value Ref Range   WBC 10.1 4.0 - 10.5 K/uL   RBC 3.41 (L) 3.87 - 5.11 MIL/uL   Hemoglobin 10.1 (L) 12.0 - 15.0 g/dL   HCT 33.1 (L) 36 - 46 %   MCV 97.1 80.0 - 100.0 fL   MCH 29.6 26.0 - 34.0 pg   MCHC 30.5 30.0 - 36.0 g/dL   RDW 18.4 (H) 11.5 - 15.5 %   Platelets 239 150 - 400 K/uL   nRBC 0.0 0.0 - 0.2 %   Neutrophils Relative % 76 %   Neutro Abs 7.6 1.7 - 7.7 K/uL   Lymphocytes Relative 12 %   Lymphs Abs 1.2 0.7 - 4.0 K/uL   Monocytes Relative 8 %   Monocytes Absolute 0.8 0 - 1 K/uL   Eosinophils Relative 3 %   Eosinophils Absolute 0.3 0 - 0 K/uL   Basophils Relative 0 %   Basophils Absolute 0.0 0 - 0 K/uL   Immature Granulocytes 1 %   Abs Immature Granulocytes 0.12 (H) 0.00 - 0.07 K/uL    Comment: Performed at [facility], [address] [place], [place] [address]  Comprehensive metabolic panel     Status: Abnormal   Collection Time: 01/24/20  1:58 AM  Result Value Ref Range   Sodium 139 135 - 145 mmol/L   Potassium 4.1 3.5 - 5.1 mmol/L   Chloride 98 98 - 111 mmol/L   CO2 30 22 - 32 mmol/L   Glucose, Bld 102 (H) 70 - 99 mg/dL    Comment: Glucose reference range applies only to samples taken after fasting for at least 8 hours.   BUN 35 (H) 8 - 23 mg/dL   Creatinine, Ser 3.38 (H)  0.44 - 1.00 mg/dL    Comment: DIALYSIS   Calcium 7.5 (L) 8.9 - 10.3 mg/dL   Total Protein 5.7 (L) 6.5 - 8.1 g/dL   Albumin 2.5 (L) 3.5 - 5.0 g/dL   AST 16 15 - 41 U/L   ALT 11 0 - 44 U/L   Alkaline Phosphatase 129 (H) 38 - 126 U/L   Total Bilirubin 0.4 0.3 - 1.2 mg/dL   GFR calc non Af Amer 13 (L) >60 mL/min   GFR calc Af Amer 15 (L) >60 mL/min   Anion gap 11 5 - 15    Comment: Performed at [facility] [address], [place], [place] [address]  Uric acid     Status: Abnormal   Collection Time: 01/24/20  1:58 AM  Result Value Ref Range   Uric Acid, Serum 1.5 (L) 2.5 - 7.1 mg/dL    Comment: Performed at [facility] [address], [place], [place] [address]  Glucose, capillary     Status: Abnormal   Collection Time: 01/24/20  8:03 AM  Result Value Ref Range   Glucose-Capillary 62 (L) 70 - 99 mg/dL    Comment: Glucose reference range applies only to samples taken after fasting for at least 8 hours.  Glucose, capillary     Status: Abnormal   Collection Time: 01/24/20  8:26 AM  Result Value Ref Range   Glucose-Capillary 117 (H) 70 -  99 mg/dL    Comment: Glucose reference range applies only to samples taken after fasting for at least 8 hours.  Hepatitis B surface antigen     Status: None   Collection Time: 01/24/20  9:07 AM  Result Value Ref Range   Hepatitis B Surface Ag NON REACTIVE NON REACTIVE    Comment: Performed at [facility], [address], [place], [place] [address]  Glucose, capillary     Status: None   Collection Time: 01/24/20 12:23 PM  Result Value Ref Range   Glucose-Capillary 72 70 - 99 mg/dL    Comment: Glucose reference range applies only to samples taken after fasting for at least 8 hours.  Glucose, capillary     Status: Abnormal   Collection Time: 01/24/20  5:12 PM  Result Value Ref Range   Glucose-Capillary 118 (H) 70 - 99 mg/dL    Comment: Glucose reference range applies only to samples taken after fasting for at least 8 hours.   Glucose, capillary     Status: Abnormal   Collection Time: 01/24/20  9:17 PM  Result Value Ref Range   Glucose-Capillary 143 (H) 70 - 99 mg/dL    Comment: Glucose reference range applies only to samples taken after fasting for at least 8 hours.  Basic metabolic panel     Status: Abnormal   Collection Time: 01/25/20  6:22 AM  Result Value Ref Range   Sodium 137 135 - 145 mmol/L   Potassium 4.4 3.5 - 5.1 mmol/L   Chloride 98 98 - 111 mmol/L   CO2 31 22 - 32 mmol/L   Glucose, Bld 132 (H) 70 - 99 mg/dL    Comment: Glucose reference range applies only to samples taken after fasting for at least 8 hours.   BUN 29 (H) 8 - 23 mg/dL   Creatinine, Ser 3.85 (H) 0.44 - 1.00 mg/dL   Calcium 8.4 (L) 8.9 - 10.3 mg/dL   GFR calc non Af Amer 11 (L) >60 mL/min   GFR calc Af Amer 13 (L) >60 mL/min   Anion gap 8 5 - 15    Comment: Performed at [facility] [address], [place], [place] [address]  Magnesium     Status: None   Collection Time: 01/25/20  6:22 AM  Result Value Ref Range   Magnesium 2.2 1.7 - 2.4 mg/dL    Comment: Performed at [facility] [address] [place], [place] [address]  Glucose, capillary     Status: Abnormal   Collection Time: 01/25/20  7:34 AM  Result Value Ref Range   Glucose-Capillary 116 (H) 70 - 99 mg/dL    Comment: Glucose reference range applies only to samples taken after fasting for at least 8 hours.  Glucose, capillary     Status: Abnormal   Collection Time: 01/25/20 11:43 AM  Result Value Ref Range   Glucose-Capillary 128 (H) 70 - 99 mg/dL    Comment: Glucose reference range applies only to samples taken after fasting for at least 8 hours.    Objective: General: Patient is awake, alert, and oriented x 3 in mild discomfort during today's visit unable to sit long due to sacral ulcers  Integument: Nails are tender, long, thickened and dystrophic with subungual debris, consistent with onychomycosis, 1-5 bilateral. Compression mesh sleeves to  both legs with decreased 1+ pitting edema.  Remaining integument unremarkable.  Vasculature:  Dorsalis Pedis pulse 0/4 bilateral. Posterior Tibial pulse  0/4 bilateral unna boots/wraps  bilateral.  Neurology:Protective sensation diminished bilateral.   Musculoskeletal: Unchanged foot deformity. No tenderness with calf compression bilateral.  No acute signs of DVT.  Assessment and Plan: Problem List Items Addressed This Visit    None    Visit Diagnoses    Pain due to onychomycosis of toenail    -  Primary   Lymphedema       Diabetes mellitus due to underlying condition with diabetic polyneuropathy, unspecified whether long term insulin use (HCC)       Relevant Medications   ALPRAZolam (XANAX) 0.25 MG tablet   hydrOXYzine (ATARAX/VISTARIL) 25 MG tablet   lovastatin (MEVACOR) 40 MG tablet     -Examined patient. -Re-Discussed and educated patient on diabetic foot care with patient and advised continue with compression sleeves for edema control as well as close follow-up of sacral ulcers with nursing at [facility] -Mechanically debrided all nails 1-5 bilateral using sterile nail nipper and filed with dremel without incident  -Awaiting diabetic shoes; patient met with Liliane Channel today -Patient to return in 3 months for at risk foot care -Patient advised to call the office if any problems or questions arise in the meantime.  Landis Martins, DPM

## 2020-04-06 DIAGNOSIS — I5033 Acute on chronic diastolic (congestive) heart failure: Secondary | ICD-10-CM | POA: Diagnosis not present

## 2020-04-06 DIAGNOSIS — J9602 Acute respiratory failure with hypercapnia: Secondary | ICD-10-CM | POA: Diagnosis not present

## 2020-04-07 DIAGNOSIS — M109 Gout, unspecified: Secondary | ICD-10-CM | POA: Diagnosis not present

## 2020-04-07 DIAGNOSIS — I129 Hypertensive chronic kidney disease with stage 1 through stage 4 chronic kidney disease, or unspecified chronic kidney disease: Secondary | ICD-10-CM | POA: Diagnosis not present

## 2020-04-07 DIAGNOSIS — Z789 Other specified health status: Secondary | ICD-10-CM | POA: Diagnosis not present

## 2020-04-07 DIAGNOSIS — M1990 Unspecified osteoarthritis, unspecified site: Secondary | ICD-10-CM | POA: Diagnosis not present

## 2020-04-07 DIAGNOSIS — L989 Disorder of the skin and subcutaneous tissue, unspecified: Secondary | ICD-10-CM | POA: Diagnosis not present

## 2020-04-07 DIAGNOSIS — E559 Vitamin D deficiency, unspecified: Secondary | ICD-10-CM | POA: Diagnosis not present

## 2020-04-07 DIAGNOSIS — L89321 Pressure ulcer of left buttock, stage 1: Secondary | ICD-10-CM | POA: Diagnosis not present

## 2020-04-07 DIAGNOSIS — L89312 Pressure ulcer of right buttock, stage 2: Secondary | ICD-10-CM | POA: Diagnosis not present

## 2020-04-07 DIAGNOSIS — E7849 Other hyperlipidemia: Secondary | ICD-10-CM | POA: Diagnosis not present

## 2020-04-07 DIAGNOSIS — E1129 Type 2 diabetes mellitus with other diabetic kidney complication: Secondary | ICD-10-CM | POA: Diagnosis not present

## 2020-04-07 DIAGNOSIS — M25511 Pain in right shoulder: Secondary | ICD-10-CM | POA: Diagnosis not present

## 2020-04-07 DIAGNOSIS — Z992 Dependence on renal dialysis: Secondary | ICD-10-CM | POA: Diagnosis not present

## 2020-04-07 DIAGNOSIS — N185 Chronic kidney disease, stage 5: Secondary | ICD-10-CM | POA: Diagnosis not present

## 2020-04-07 DIAGNOSIS — M6281 Muscle weakness (generalized): Secondary | ICD-10-CM | POA: Diagnosis not present

## 2020-04-08 DIAGNOSIS — N186 End stage renal disease: Secondary | ICD-10-CM | POA: Diagnosis not present

## 2020-04-08 DIAGNOSIS — E1022 Type 1 diabetes mellitus with diabetic chronic kidney disease: Secondary | ICD-10-CM | POA: Diagnosis not present

## 2020-04-08 DIAGNOSIS — Z992 Dependence on renal dialysis: Secondary | ICD-10-CM | POA: Diagnosis not present

## 2020-04-10 DIAGNOSIS — D689 Coagulation defect, unspecified: Secondary | ICD-10-CM | POA: Diagnosis not present

## 2020-04-10 DIAGNOSIS — Z992 Dependence on renal dialysis: Secondary | ICD-10-CM | POA: Diagnosis not present

## 2020-04-10 DIAGNOSIS — N186 End stage renal disease: Secondary | ICD-10-CM | POA: Diagnosis not present

## 2020-04-10 DIAGNOSIS — D509 Iron deficiency anemia, unspecified: Secondary | ICD-10-CM | POA: Diagnosis not present

## 2020-04-14 DIAGNOSIS — L89312 Pressure ulcer of right buttock, stage 2: Secondary | ICD-10-CM | POA: Diagnosis not present

## 2020-04-14 DIAGNOSIS — N185 Chronic kidney disease, stage 5: Secondary | ICD-10-CM | POA: Diagnosis not present

## 2020-04-14 DIAGNOSIS — E662 Morbid (severe) obesity with alveolar hypoventilation: Secondary | ICD-10-CM | POA: Diagnosis not present

## 2020-04-14 DIAGNOSIS — E1129 Type 2 diabetes mellitus with other diabetic kidney complication: Secondary | ICD-10-CM | POA: Diagnosis not present

## 2020-04-14 DIAGNOSIS — Z1331 Encounter for screening for depression: Secondary | ICD-10-CM | POA: Diagnosis not present

## 2020-04-14 DIAGNOSIS — E1140 Type 2 diabetes mellitus with diabetic neuropathy, unspecified: Secondary | ICD-10-CM | POA: Diagnosis not present

## 2020-04-14 DIAGNOSIS — M25511 Pain in right shoulder: Secondary | ICD-10-CM | POA: Diagnosis not present

## 2020-04-14 DIAGNOSIS — I739 Peripheral vascular disease, unspecified: Secondary | ICD-10-CM | POA: Diagnosis not present

## 2020-04-14 DIAGNOSIS — G4733 Obstructive sleep apnea (adult) (pediatric): Secondary | ICD-10-CM | POA: Diagnosis not present

## 2020-04-14 DIAGNOSIS — I129 Hypertensive chronic kidney disease with stage 1 through stage 4 chronic kidney disease, or unspecified chronic kidney disease: Secondary | ICD-10-CM | POA: Diagnosis not present

## 2020-04-14 DIAGNOSIS — E1121 Type 2 diabetes mellitus with diabetic nephropathy: Secondary | ICD-10-CM | POA: Diagnosis not present

## 2020-04-16 DIAGNOSIS — I10 Essential (primary) hypertension: Secondary | ICD-10-CM | POA: Diagnosis not present

## 2020-04-16 DIAGNOSIS — E7800 Pure hypercholesterolemia, unspecified: Secondary | ICD-10-CM | POA: Diagnosis not present

## 2020-04-16 DIAGNOSIS — E1165 Type 2 diabetes mellitus with hyperglycemia: Secondary | ICD-10-CM | POA: Diagnosis not present

## 2020-04-16 DIAGNOSIS — N186 End stage renal disease: Secondary | ICD-10-CM | POA: Diagnosis not present

## 2020-04-25 DIAGNOSIS — M19012 Primary osteoarthritis, left shoulder: Secondary | ICD-10-CM | POA: Diagnosis not present

## 2020-04-25 DIAGNOSIS — M25511 Pain in right shoulder: Secondary | ICD-10-CM | POA: Diagnosis not present

## 2020-04-28 DIAGNOSIS — M6281 Muscle weakness (generalized): Secondary | ICD-10-CM | POA: Diagnosis not present

## 2020-04-28 DIAGNOSIS — R2681 Unsteadiness on feet: Secondary | ICD-10-CM | POA: Diagnosis not present

## 2020-04-28 DIAGNOSIS — E1121 Type 2 diabetes mellitus with diabetic nephropathy: Secondary | ICD-10-CM | POA: Diagnosis not present

## 2020-04-28 DIAGNOSIS — L989 Disorder of the skin and subcutaneous tissue, unspecified: Secondary | ICD-10-CM | POA: Diagnosis not present

## 2020-04-28 DIAGNOSIS — M797 Fibromyalgia: Secondary | ICD-10-CM | POA: Diagnosis not present

## 2020-04-28 DIAGNOSIS — M25511 Pain in right shoulder: Secondary | ICD-10-CM | POA: Diagnosis not present

## 2020-04-28 DIAGNOSIS — M1990 Unspecified osteoarthritis, unspecified site: Secondary | ICD-10-CM | POA: Diagnosis not present

## 2020-04-30 DIAGNOSIS — N186 End stage renal disease: Secondary | ICD-10-CM | POA: Diagnosis not present

## 2020-04-30 DIAGNOSIS — Z992 Dependence on renal dialysis: Secondary | ICD-10-CM | POA: Diagnosis not present

## 2020-04-30 DIAGNOSIS — I871 Compression of vein: Secondary | ICD-10-CM | POA: Diagnosis not present

## 2020-05-06 DIAGNOSIS — J9602 Acute respiratory failure with hypercapnia: Secondary | ICD-10-CM | POA: Diagnosis not present

## 2020-05-06 DIAGNOSIS — I5033 Acute on chronic diastolic (congestive) heart failure: Secondary | ICD-10-CM | POA: Diagnosis not present

## 2020-05-09 DIAGNOSIS — N186 End stage renal disease: Secondary | ICD-10-CM | POA: Diagnosis not present

## 2020-05-09 DIAGNOSIS — E1022 Type 1 diabetes mellitus with diabetic chronic kidney disease: Secondary | ICD-10-CM | POA: Diagnosis not present

## 2020-05-09 DIAGNOSIS — Z992 Dependence on renal dialysis: Secondary | ICD-10-CM | POA: Diagnosis not present

## 2020-05-11 DIAGNOSIS — N186 End stage renal disease: Secondary | ICD-10-CM | POA: Diagnosis not present

## 2020-05-11 DIAGNOSIS — Z992 Dependence on renal dialysis: Secondary | ICD-10-CM | POA: Diagnosis not present

## 2020-05-11 DIAGNOSIS — D689 Coagulation defect, unspecified: Secondary | ICD-10-CM | POA: Diagnosis not present

## 2020-05-29 ENCOUNTER — Ambulatory Visit: Payer: HMO

## 2020-06-04 ENCOUNTER — Other Ambulatory Visit: Payer: Self-pay

## 2020-06-04 NOTE — Patient Outreach (Signed)
  [facility]) Care Management Chronic Special Needs Program  06/04/2020  Name: Jean Mcclain DOB: 21-Dec-1945  MRN: [id number]  Ms. [name] is enrolled in a chronic special needs plan for Diabetes. Client called with no answer No answer and unable to leave  HIPAA compliant message left mailbox not set up. 1st attempt Plan for 2nd outreach call in 3-4 business days Chronic care management coordinator will attempt outreach in 3-4 business days.   Peter Garter RN, Jackquline [place], CDE Chronic Care Management Coordinator [facility] [phone]

## 2020-06-05 ENCOUNTER — Ambulatory Visit: Payer: HMO

## 2020-06-08 ENCOUNTER — Other Ambulatory Visit: Payer: Self-pay

## 2020-06-08 NOTE — Patient Outreach (Signed)
  [facility]) Care Management Chronic Special Needs Program  06/08/2020  Name: DAWNYEL LEVEN DOB: [birth year]  MRN: [id number]  Ms. Sway Guttierrez is enrolled in a chronic special needs plan for Diabetes. Client called with no answer No answer and unable to lease HIPAA compliant message as mail box not set up. 2nd attempt Plan for 3rd outreach call in 3-4 days Chronic care management coordinator will attempt outreach in 3-4 days.   Peter Garter RN, Jackquline Denmark, CDE Chronic Care Management Coordinator [facility] [phone]

## 2020-06-09 DIAGNOSIS — E1022 Type 1 diabetes mellitus with diabetic chronic kidney disease: Secondary | ICD-10-CM | POA: Diagnosis not present

## 2020-06-09 DIAGNOSIS — Z992 Dependence on renal dialysis: Secondary | ICD-10-CM | POA: Diagnosis not present

## 2020-06-09 DIAGNOSIS — N186 End stage renal disease: Secondary | ICD-10-CM | POA: Diagnosis not present

## 2020-06-12 ENCOUNTER — Other Ambulatory Visit: Payer: Self-pay

## 2020-06-12 DIAGNOSIS — D631 Anemia in chronic kidney disease: Secondary | ICD-10-CM | POA: Diagnosis not present

## 2020-06-12 DIAGNOSIS — Z992 Dependence on renal dialysis: Secondary | ICD-10-CM | POA: Diagnosis not present

## 2020-06-12 DIAGNOSIS — D689 Coagulation defect, unspecified: Secondary | ICD-10-CM | POA: Diagnosis not present

## 2020-06-12 DIAGNOSIS — D509 Iron deficiency anemia, unspecified: Secondary | ICD-10-CM | POA: Diagnosis not present

## 2020-06-12 DIAGNOSIS — N186 End stage renal disease: Secondary | ICD-10-CM | POA: Diagnosis not present

## 2020-06-12 NOTE — Patient Outreach (Signed)
[facility]) Care Management Chronic Special Needs Program  06/12/2020  Name: Jean Mcclain DOB: 28-Apr-1946  MRN: [id number]  Jean Mcclain is enrolled in a chronic special needs plan for  Diabetes.  Client called with no answer and unable to leave  HIPAA compliant message .  3rd attempt   The client's individualized care plan was updated based on available data and  2021 Health Risk Assessment Goals Addressed              This Visit's Progress   .  Client will report improved coping of chronic pain in the next 6 months(continue 9/3/321)   No change     Unable to assess Plan to keep follow up appointment with provider and discuss pain control as needed    .  Client will use Assistive Devices as needed and verbalize understanding of device use   On track     Reports no issues with her glucometer and other home equipment    .  Client will verbalize knowledge of self management of Hypertension as evidences by BP reading of 140/90 or less; or as defined by provider   On track     B/P less than 140/90 on provider visits and at dialysis 130/74 at last provider visit Plan to check B/P regularly Take B/P medications as ordered Plan to follow a low salt renal diet     .  Client will verbalize understanding of treatment plan for impaired skin integrity and follow up with provider by next 6 months(continued 06/12/20   On track     [facility] working with client to get new wheelchair cushion Keep skin clean and dry Plan to keep pressure off of area Plan to call provider for any redness, swelling or signs of infection    .  COMPLETED: Diabetes Patient stated goal to talk with health coach about weight loss in the next 6 months (pt-stated)   Not on track     Client did not respond to 3 outreach calls from[employer]) health coach  Goal closed 06/12/20     .  HEMOGLOBIN A1C < 7        Last result 5.2% 04/07/20 Plan to check blood sugars as directed with  goals fasting or 1 1/2 hours after eating with goal of 80-130 fasting and 180 or less after meals Plan to follow a low carbohydrate, low salt diet, watch portion sizes and avoid sugar sweetened drinks    .  COMPLETED: Maintain timely refills of diabetic medication as prescribed within the year .   On track     Maintaining timely refills of medications per dispense report Reinforced importance of getting medications refilled on time Goal completed 06/12/20    .  COMPLETED: Obtain annual  Lipid Profile, LDL-C   On track     Last completed 04/07/20 LDL 95 The goal for LDL is less than 70 mg/dL as you are at high risk for complications Plan to take statin as ordered Goal completed 06/12/20    .  Obtain Annual Eye (retinal)  Exam    On track     Completed by [facility] in 2020 Plan to have a dilated eye exam every year    .  COMPLETED: Obtain Annual Foot Exam   On track     Last completed 04/14/20 Check your skin and feet every day for cuts, bruises, redness, blisters, or sores. Schedule a foot exam with your health care provider once every  year Goal completed 06/12/20    .  COMPLETED: Obtain Hemoglobin A1C at least 2 times per year   On track     Last completed 01/22/20, 04/07/20 It is important to have your Hemoglobin A1C checked every 6 months if you are at goal and every 3 months if you are not at goal Goal completed 06/12/20    .  Visit Primary Care Provider or Endocrinologist at least 2 times per year    On track     Primary care provider 11/25/19, 02/18/20, 04/14/20 Endocrinologist in 04/16/20 Annual wellness visit 04/14/20 [facility] provider 04/14/20 next visit scheduled for 06/23/20       Client active with [facility] for complex management Plan:  . Send unsuccessful outreach letter with a copy of individualized care plan to client . Send individualized care plan to provider Plan to review [facility]'s plan of care and will collaborated with [facility] team as needed Client will be  outreached by a [employer] RNCM in 6 months per tier level  [facility], [facility], [place] Management [phone]

## 2020-06-14 ENCOUNTER — Inpatient Hospital Stay (HOSPITAL_COMMUNITY): Payer: HMO

## 2020-06-14 ENCOUNTER — Inpatient Hospital Stay (HOSPITAL_COMMUNITY)
Admission: EM | Admit: 2020-06-14 | Discharge: 2020-06-27 | DRG: 492 | Disposition: A | Discharge status: Skilled Nursing Facility | Payer: HMO | Attending: Family Medicine | Admitting: Family Medicine

## 2020-06-14 ENCOUNTER — Other Ambulatory Visit: Payer: Self-pay

## 2020-06-14 ENCOUNTER — Emergency Department (HOSPITAL_COMMUNITY): Payer: HMO

## 2020-06-14 DIAGNOSIS — G4733 Obstructive sleep apnea (adult) (pediatric): Secondary | ICD-10-CM

## 2020-06-14 DIAGNOSIS — Z7189 Other specified counseling: Secondary | ICD-10-CM | POA: Diagnosis not present

## 2020-06-14 DIAGNOSIS — E785 Hyperlipidemia, unspecified: Secondary | ICD-10-CM

## 2020-06-14 DIAGNOSIS — M810 Age-related osteoporosis without current pathological fracture: Secondary | ICD-10-CM | POA: Diagnosis not present

## 2020-06-14 DIAGNOSIS — X58XXXA Exposure to other specified factors, initial encounter: Secondary | ICD-10-CM | POA: Diagnosis present

## 2020-06-14 DIAGNOSIS — I959 Hypotension, unspecified: Secondary | ICD-10-CM | POA: Diagnosis not present

## 2020-06-14 DIAGNOSIS — G8918 Other acute postprocedural pain: Secondary | ICD-10-CM | POA: Diagnosis not present

## 2020-06-14 DIAGNOSIS — R633 Feeding difficulties, unspecified: Secondary | ICD-10-CM | POA: Diagnosis not present

## 2020-06-14 DIAGNOSIS — N2581 Secondary hyperparathyroidism of renal origin: Secondary | ICD-10-CM | POA: Diagnosis present

## 2020-06-14 DIAGNOSIS — M25561 Pain in right knee: Secondary | ICD-10-CM | POA: Diagnosis not present

## 2020-06-14 DIAGNOSIS — E1129 Type 2 diabetes mellitus with other diabetic kidney complication: Secondary | ICD-10-CM | POA: Diagnosis not present

## 2020-06-14 DIAGNOSIS — Z20822 Contact with and (suspected) exposure to covid-19: Secondary | ICD-10-CM | POA: Diagnosis not present

## 2020-06-14 DIAGNOSIS — R0603 Acute respiratory distress: Secondary | ICD-10-CM

## 2020-06-14 DIAGNOSIS — Z79899 Other long term (current) drug therapy: Secondary | ICD-10-CM

## 2020-06-14 DIAGNOSIS — S8252XA Displaced fracture of medial malleolus of left tibia, initial encounter for closed fracture: Principal | ICD-10-CM | POA: Diagnosis present

## 2020-06-14 DIAGNOSIS — M7989 Other specified soft tissue disorders: Secondary | ICD-10-CM | POA: Diagnosis not present

## 2020-06-14 DIAGNOSIS — E1122 Type 2 diabetes mellitus with diabetic chronic kidney disease: Secondary | ICD-10-CM | POA: Diagnosis not present

## 2020-06-14 DIAGNOSIS — Z7901 Long term (current) use of anticoagulants: Secondary | ICD-10-CM

## 2020-06-14 DIAGNOSIS — R509 Fever, unspecified: Secondary | ICD-10-CM

## 2020-06-14 DIAGNOSIS — Z96652 Presence of left artificial knee joint: Secondary | ICD-10-CM | POA: Diagnosis not present

## 2020-06-14 DIAGNOSIS — S82422D Displaced transverse fracture of shaft of left fibula, subsequent encounter for closed fracture with routine healing: Secondary | ICD-10-CM | POA: Diagnosis not present

## 2020-06-14 DIAGNOSIS — J690 Pneumonitis due to inhalation of food and vomit: Secondary | ICD-10-CM | POA: Diagnosis not present

## 2020-06-14 DIAGNOSIS — J9621 Acute and chronic respiratory failure with hypoxia: Secondary | ICD-10-CM | POA: Diagnosis not present

## 2020-06-14 DIAGNOSIS — Z6841 Body Mass Index (BMI) 40.0 and over, adult: Secondary | ICD-10-CM

## 2020-06-14 DIAGNOSIS — R2681 Unsteadiness on feet: Secondary | ICD-10-CM | POA: Diagnosis not present

## 2020-06-14 DIAGNOSIS — D631 Anemia in chronic kidney disease: Secondary | ICD-10-CM | POA: Diagnosis not present

## 2020-06-14 DIAGNOSIS — E872 Acidosis: Secondary | ICD-10-CM | POA: Diagnosis not present

## 2020-06-14 DIAGNOSIS — E662 Morbid (severe) obesity with alveolar hypoventilation: Secondary | ICD-10-CM | POA: Diagnosis present

## 2020-06-14 DIAGNOSIS — E669 Obesity, unspecified: Secondary | ICD-10-CM | POA: Diagnosis not present

## 2020-06-14 DIAGNOSIS — D62 Acute posthemorrhagic anemia: Secondary | ICD-10-CM

## 2020-06-14 DIAGNOSIS — F419 Anxiety disorder, unspecified: Secondary | ICD-10-CM | POA: Diagnosis present

## 2020-06-14 DIAGNOSIS — Z66 Do not resuscitate: Secondary | ICD-10-CM | POA: Diagnosis not present

## 2020-06-14 DIAGNOSIS — S82892A Other fracture of left lower leg, initial encounter for closed fracture: Secondary | ICD-10-CM | POA: Diagnosis not present

## 2020-06-14 DIAGNOSIS — M19072 Primary osteoarthritis, left ankle and foot: Secondary | ICD-10-CM | POA: Diagnosis not present

## 2020-06-14 DIAGNOSIS — N186 End stage renal disease: Secondary | ICD-10-CM | POA: Diagnosis not present

## 2020-06-14 DIAGNOSIS — J9811 Atelectasis: Secondary | ICD-10-CM | POA: Diagnosis not present

## 2020-06-14 DIAGNOSIS — E119 Type 2 diabetes mellitus without complications: Secondary | ICD-10-CM

## 2020-06-14 DIAGNOSIS — Z471 Aftercare following joint replacement surgery: Secondary | ICD-10-CM | POA: Diagnosis not present

## 2020-06-14 DIAGNOSIS — Z992 Dependence on renal dialysis: Secondary | ICD-10-CM

## 2020-06-14 DIAGNOSIS — S82899A Other fracture of unspecified lower leg, initial encounter for closed fracture: Secondary | ICD-10-CM | POA: Diagnosis not present

## 2020-06-14 DIAGNOSIS — M25562 Pain in left knee: Secondary | ICD-10-CM | POA: Diagnosis not present

## 2020-06-14 DIAGNOSIS — Z96651 Presence of right artificial knee joint: Secondary | ICD-10-CM | POA: Diagnosis not present

## 2020-06-14 DIAGNOSIS — K219 Gastro-esophageal reflux disease without esophagitis: Secondary | ICD-10-CM | POA: Diagnosis not present

## 2020-06-14 DIAGNOSIS — Z794 Long term (current) use of insulin: Secondary | ICD-10-CM | POA: Diagnosis not present

## 2020-06-14 DIAGNOSIS — Z7952 Long term (current) use of systemic steroids: Secondary | ICD-10-CM

## 2020-06-14 DIAGNOSIS — Z87891 Personal history of nicotine dependence: Secondary | ICD-10-CM

## 2020-06-14 DIAGNOSIS — J9602 Acute respiratory failure with hypercapnia: Secondary | ICD-10-CM | POA: Diagnosis not present

## 2020-06-14 DIAGNOSIS — R29898 Other symptoms and signs involving the musculoskeletal system: Secondary | ICD-10-CM | POA: Diagnosis not present

## 2020-06-14 DIAGNOSIS — Z86718 Personal history of other venous thrombosis and embolism: Secondary | ICD-10-CM

## 2020-06-14 DIAGNOSIS — G9349 Other encephalopathy: Secondary | ICD-10-CM | POA: Diagnosis not present

## 2020-06-14 DIAGNOSIS — N182 Chronic kidney disease, stage 2 (mild): Secondary | ICD-10-CM

## 2020-06-14 DIAGNOSIS — S9302XA Subluxation of left ankle joint, initial encounter: Secondary | ICD-10-CM | POA: Diagnosis not present

## 2020-06-14 DIAGNOSIS — L89312 Pressure ulcer of right buttock, stage 2: Secondary | ICD-10-CM | POA: Diagnosis not present

## 2020-06-14 DIAGNOSIS — Z4789 Encounter for other orthopedic aftercare: Secondary | ICD-10-CM | POA: Diagnosis not present

## 2020-06-14 DIAGNOSIS — Z8249 Family history of ischemic heart disease and other diseases of the circulatory system: Secondary | ICD-10-CM

## 2020-06-14 DIAGNOSIS — R0602 Shortness of breath: Secondary | ICD-10-CM | POA: Diagnosis not present

## 2020-06-14 DIAGNOSIS — G8929 Other chronic pain: Secondary | ICD-10-CM | POA: Diagnosis present

## 2020-06-14 DIAGNOSIS — I517 Cardiomegaly: Secondary | ICD-10-CM | POA: Diagnosis not present

## 2020-06-14 DIAGNOSIS — I10 Essential (primary) hypertension: Secondary | ICD-10-CM | POA: Diagnosis present

## 2020-06-14 DIAGNOSIS — I120 Hypertensive chronic kidney disease with stage 5 chronic kidney disease or end stage renal disease: Secondary | ICD-10-CM | POA: Diagnosis not present

## 2020-06-14 DIAGNOSIS — Z7401 Bed confinement status: Secondary | ICD-10-CM | POA: Diagnosis not present

## 2020-06-14 DIAGNOSIS — M6281 Muscle weakness (generalized): Secondary | ICD-10-CM | POA: Diagnosis not present

## 2020-06-14 DIAGNOSIS — M2550 Pain in unspecified joint: Secondary | ICD-10-CM | POA: Diagnosis not present

## 2020-06-14 DIAGNOSIS — S8292XA Unspecified fracture of left lower leg, initial encounter for closed fracture: Secondary | ICD-10-CM | POA: Diagnosis not present

## 2020-06-14 DIAGNOSIS — J9622 Acute and chronic respiratory failure with hypercapnia: Secondary | ICD-10-CM | POA: Diagnosis present

## 2020-06-14 DIAGNOSIS — I890 Lymphedema, not elsewhere classified: Secondary | ICD-10-CM | POA: Diagnosis present

## 2020-06-14 DIAGNOSIS — E559 Vitamin D deficiency, unspecified: Secondary | ICD-10-CM | POA: Diagnosis present

## 2020-06-14 DIAGNOSIS — Z96653 Presence of artificial knee joint, bilateral: Secondary | ICD-10-CM | POA: Diagnosis present

## 2020-06-14 DIAGNOSIS — R600 Localized edema: Secondary | ICD-10-CM | POA: Diagnosis not present

## 2020-06-14 DIAGNOSIS — Z79891 Long term (current) use of opiate analgesic: Secondary | ICD-10-CM

## 2020-06-14 DIAGNOSIS — R531 Weakness: Secondary | ICD-10-CM | POA: Diagnosis not present

## 2020-06-14 DIAGNOSIS — R52 Pain, unspecified: Secondary | ICD-10-CM | POA: Diagnosis not present

## 2020-06-14 DIAGNOSIS — L8990 Pressure ulcer of unspecified site, unspecified stage: Secondary | ICD-10-CM | POA: Diagnosis present

## 2020-06-14 DIAGNOSIS — R278 Other lack of coordination: Secondary | ICD-10-CM | POA: Diagnosis not present

## 2020-06-14 DIAGNOSIS — G253 Myoclonus: Secondary | ICD-10-CM | POA: Diagnosis present

## 2020-06-14 DIAGNOSIS — G2581 Restless legs syndrome: Secondary | ICD-10-CM | POA: Diagnosis present

## 2020-06-14 DIAGNOSIS — Z538 Procedure and treatment not carried out for other reasons: Secondary | ICD-10-CM | POA: Diagnosis not present

## 2020-06-14 DIAGNOSIS — M7732 Calcaneal spur, left foot: Secondary | ICD-10-CM | POA: Diagnosis not present

## 2020-06-14 DIAGNOSIS — I83009 Varicose veins of unspecified lower extremity with ulcer of unspecified site: Secondary | ICD-10-CM | POA: Diagnosis present

## 2020-06-14 DIAGNOSIS — N250 Renal osteodystrophy: Secondary | ICD-10-CM | POA: Diagnosis not present

## 2020-06-14 DIAGNOSIS — M25572 Pain in left ankle and joints of left foot: Secondary | ICD-10-CM | POA: Diagnosis present

## 2020-06-14 LAB — COMPREHENSIVE METABOLIC PANEL
ALT: 25 U/L (ref 0–44)
AST: 26 U/L (ref 15–41)
Albumin: 2.9 g/dL — ABNORMAL LOW (ref 3.5–5.0)
Alkaline Phosphatase: 91 U/L (ref 38–126)
Anion gap: 16 — ABNORMAL HIGH (ref 5–15)
BUN: 71 mg/dL — ABNORMAL HIGH (ref 8–23)
CO2: 28 mmol/L (ref 22–32)
Calcium: 8.4 mg/dL — ABNORMAL LOW (ref 8.9–10.3)
Chloride: 99 mmol/L (ref 98–111)
Creatinine, Ser: 4.81 mg/dL — ABNORMAL HIGH (ref 0.44–1.00)
GFR calc Af Amer: 10 mL/min — ABNORMAL LOW (ref >60)
GFR calc non Af Amer: 8 mL/min — ABNORMAL LOW (ref >60)
Glucose, Bld: 132 mg/dL — ABNORMAL HIGH (ref 70–99)
Potassium: 4.8 mmol/L (ref 3.5–5.1)
Sodium: 143 mmol/L (ref 135–145)
Total Bilirubin: 0.9 mg/dL (ref 0.3–1.2)
Total Protein: 5.6 g/dL — ABNORMAL LOW (ref 6.5–8.1)

## 2020-06-14 LAB — CBC WITH DIFFERENTIAL/PLATELET
Abs Immature Granulocytes: 0.14 10*3/uL — ABNORMAL HIGH (ref 0.00–0.07)
Basophils Absolute: 0 10*3/uL (ref 0.0–0.1)
Basophils Relative: 0 %
Eosinophils Absolute: 0 10*3/uL (ref 0.0–0.5)
Eosinophils Relative: 0 %
HCT: 37.3 % (ref 36.0–46.0)
Hemoglobin: 11.1 g/dL — ABNORMAL LOW (ref 12.0–15.0)
Immature Granulocytes: 1 %
Lymphocytes Relative: 5 %
Lymphs Abs: 0.5 10*3/uL — ABNORMAL LOW (ref 0.7–4.0)
MCH: 30.2 pg (ref 26.0–34.0)
MCHC: 29.8 g/dL — ABNORMAL LOW (ref 30.0–36.0)
MCV: 101.6 fL — ABNORMAL HIGH (ref 80.0–100.0)
Monocytes Absolute: 0.8 10*3/uL (ref 0.1–1.0)
Monocytes Relative: 7 %
Neutro Abs: 9 10*3/uL — ABNORMAL HIGH (ref 1.7–7.7)
Neutrophils Relative %: 87 %
Platelets: 239 10*3/uL (ref 150–400)
RBC: 3.67 MIL/uL — ABNORMAL LOW (ref 3.87–5.11)
RDW: 16 % — ABNORMAL HIGH (ref 11.5–15.5)
WBC: 10.4 10*3/uL (ref 4.0–10.5)
nRBC: 0 % (ref 0.0–0.2)

## 2020-06-14 LAB — CBG MONITORING, ED: Glucose-Capillary: 124 mg/dL — ABNORMAL HIGH (ref 70–99)

## 2020-06-14 LAB — SARS CORONAVIRUS 2 BY RT PCR (HOSPITAL ORDER, PERFORMED IN ~~LOC~~ HOSPITAL LAB): SARS Coronavirus 2: NEGATIVE

## 2020-06-14 MED ORDER — INSULIN ASPART 100 UNIT/ML ~~LOC~~ SOLN
0.0000 [IU] | SUBCUTANEOUS | Status: DC
  Administered 2020-06-15 (×2): 2 [IU] via SUBCUTANEOUS
  Administered 2020-06-15: 1 [IU] via SUBCUTANEOUS
  Filled 2020-06-14: qty 0.06

## 2020-06-14 MED ORDER — MORPHINE SULFATE (PF) 2 MG/ML IV SOLN
1.0000 mg | INTRAVENOUS | Status: DC | PRN
  Administered 2020-06-15: 1 mg via INTRAVENOUS
  Filled 2020-06-14: qty 1

## 2020-06-14 MED ORDER — ACETAMINOPHEN 650 MG RE SUPP: 650.0000 mg | Freq: Four times a day (QID) | RECTAL | Status: DC | PRN

## 2020-06-14 MED ORDER — ACETAMINOPHEN 325 MG PO TABS
650.0000 mg | ORAL_TABLET | Freq: Four times a day (QID) | ORAL | Status: DC | PRN
  Administered 2020-06-18 – 2020-06-21 (×4): 650 mg via ORAL
  Filled 2020-06-14 (×4): qty 2

## 2020-06-14 NOTE — H&P (Addendum)
History and Physical    Jean Mcclain ZOX:[id number] DOB: Jan 21, 1946 DOA: 06/14/2020  PCP: Leanna Battles, MD Patient coming from: [facility]  Chief Complaint: Left ankle pain  HPI: Jean Mcclain is a 74 y.o. female with medical history significant of prior DVT on Eliquis, ESRD on HD MWF, insulin-dependent type 2 diabetes, morbid obesity (BMI 42.97), OSA not on CPAP, hypertension, hyperlipidemia, GERD presenting to the ED with complaints of worsened ankle and foot pain with decreased mobility.  Patient states her left foot has been twisting and painful for a while.  She thinks it might have been like this for several months.  She is not able to walk or bear weight on this foot.  She does not recall injuring the foot.  States today she is having runny nose and sore throat.  She has been vaccinated against Covid.  Denies cough, shortness of breath, chest pain, nausea, vomiting, abdominal pain, or dysuria.  States she had a blood clot in her left leg about 2 years ago and takes Eliquis.  She uses 2 L home oxygen at night for sleep apnea as she is not able to tolerate CPAP.  ED Course: Vital signs stable.  WBC 10.4, hemoglobin 11.1 (at baseline), platelet 239k.  Sodium 143, potassium 4.8, chloride 99, bicarb 28, BUN 71, creatinine 4.8, glucose 132.  SARS-CoV-2 PCR test pending.  X-ray of left ankle showing transverse mildly displaced fracture to the medial malleolus.  X-ray of left foot showing suspected subtalar dislocation with lateral angulation of the majority of the foot with respect to the talus.  Lateral subluxation of the navicular at the talonavicular joint.  No obvious acute foot fracture.  ED provider discussed the case with Dr. Charletta Cousin, orthopedics will consult.  He recommended obtaining a CT of the ankle to help with operative planning.  Review of Systems:  All systems reviewed and apart from history of presenting illness, are negative.  Past Medical History:   Diagnosis Date  . Anxiety   . Arthritis    osteo  . Chronic kidney disease   . Diabetes mellitus, type 2 (HCC)    Type II  . DVT (deep venous thrombosis) ([place]) 03/2017   left popliteal  . Facet joint disease   . Facet joint disease    foot  . Fibula fracture 02/2017   left  . GERD (gastroesophageal reflux disease)   . Hyperlipidemia   . Hypertension   . OSA (obstructive sleep apnea)    does not use CPAP   . Osteopenia   . Shortness of breath    with exertion  . Venous stasis ulcers ([place])   . Vitamin D deficiency     Past Surgical History:  Procedure Laterality Date  . AV FISTULA PLACEMENT Right 08/20/2018   Procedure: ARTERIOVENOUS (AV) FISTULA CREATION BRACHIOCEPHALIC;  Surgeon: Angelia Mould, MD;  Location: [facility];  Service: Vascular;  Laterality: Right;  . BREAST SURGERY Right 03/1997   biospy  . CARPAL TUNNEL RELEASE Left 2016  . INSERTION OF MESH N/A 01/08/2013   Procedure: INSERTION OF MESH;  Surgeon: Earnstine Regal, MD;  Location: [facility];  Service: General;  Laterality: N/A;  . IR THORACENTESIS ASP PLEURAL SPACE W/IMG GUIDE  04/06/2017  . JOINT REPLACEMENT Right 1999  . TOTAL KNEE ARTHROPLASTY Left 1999   Left  . TRIGGER FINGER RELEASE Left 11/2014  . TRIGGER FINGER RELEASE Right   . VENTRAL HERNIA REPAIR N/A 01/08/2013   Procedure: LAPAROSCOPIC VENTRAL  HERNIA;  Surgeon: Earnstine Regal, MD;  Location: [facility];  Service: General;  Laterality: N/A;     reports that she quit smoking about 38 years ago. Her smoking use included cigarettes. She quit after 27.00 years of use. She has never used smokeless tobacco. She reports that she does not drink alcohol and does not use drugs.  No Known Allergies  Family History  Problem Relation Age of Onset  . Leukemia Mother   . Hypertension Brother   . Emphysema Father     Prior to Admission medications   Medication Sig Start Date End Date Taking? Authorizing Provider  acetaminophen (TYLENOL) 500 MG tablet Take  1,000 mg by mouth 3 (three) times daily as needed for moderate pain or headache.    [provider]  albuterol (VENTOLIN HFA) 108 (90 Base) MCG/ACT inhaler Inhale into the lungs. 03/26/20   [provider]  ALPRAZolam Duanne Moron) 0.25 MG tablet Take by mouth. 03/26/20   [provider]  apixaban (ELIQUIS) 2.5 MG TABS tablet Take 2.5 mg by mouth 2 (two) times daily.    [provider]  B-D INS SYR ULTRAFINE 1CC/30G 30G X 1/2" 1 ML MISC 1 each by Other route daily.  12/23/19   [provider]  Blood Glucose Monitoring Suppl ([place]) w/Device KIT  03/23/20   [provider]  calcium acetate (PHOSLO) 667 MG tablet Take 667 mg by mouth 3 (three) times daily. 11/28/19   [provider]  carbidopa-levodopa (SINEMET IR) 10-100 MG tablet Take 1 tablet by mouth in the morning and at bedtime.  11/23/19   [provider]  colchicine 0.6 MG tablet Take 0.6 mg by mouth daily as needed (gout flare).  02/21/17   [provider]  cyclobenzaprine (FLEXERIL) 10 MG tablet Take 10 mg by mouth at bedtime.     [provider]  doxercalciferol (HECTOROL) 4 MCG/2ML injection Inject 1 mL (2 mcg total) into the vein every Monday, Wednesday, and Friday with hemodialysis. 01/27/20   [place] Eric J, DO  febuxostat (ULORIC) 40 MG tablet Take 40 mg by mouth daily.    [provider]  gabapentin (NEURONTIN) 300 MG capsule Take 300 mg by mouth at bedtime. 11/14/19   [provider]  glucose blood test strip 1 each by Other route daily.     [provider]  HYDROcodone-Acetaminophen 5-300 MG TABS Take by mouth. 01/27/20   [provider]  hydrOXYzine (ATARAX/VISTARIL) 10 MG tablet Take 1 tablet (10 mg total) by mouth 2 (two) times daily as needed. Patient taking differently: Take 10 mg by mouth 2 (two) times daily as needed for itching or anxiety.  11/20/19   Dohmeier, Asencion Partridge, MD  hydrOXYzine  (ATARAX/VISTARIL) 25 MG tablet Take 25 mg by mouth 3 (three) times daily. 03/26/20   [provider]  insulin NPH Human (NOVOLIN N) 100 UNIT/ML injection Inject 0.2 mLs (20 Units total) into the skin 2 (two) times daily at 8 am and 10 pm. 01/25/20   [place] Donnamarie Poag, DO  insulin regular (NOVOLIN R RELION) 100 units/mL injection Inject 10-25 Units into the skin 3 (three) times daily before meals. Take 10 units if  cbg is between 100 to 151 if cbg is over 300 take 25 units per patient    [provider]  Lancets (ONETOUCH DELICA PLUS LMBEML54G) Susank 1 each by Other route daily.  03/05/19   [provider]  lidocaine-prilocaine (EMLA) cream Apply 1 application topically  See admin instructions. 1 hour before dialysis to port 09/28/19   [provider]  lovastatin (MEVACOR) 40 MG tablet Take 40 mg by mouth daily. 03/24/20   [provider]  metoprolol succinate (TOPROL-XL) 25 MG 24 hr tablet Take 25 mg by mouth daily. 03/26/20   [provider]  multivitamin (RENA-VIT) TABS tablet Take 1 tablet by mouth daily. 10/09/19   [provider]  omeprazole (PRILOSEC) 20 MG capsule Take 20 mg by mouth daily before breakfast.     [provider]  oxyCODONE (OXY IR/ROXICODONE) 5 MG immediate release tablet  03/04/20   [provider]  polyethylene glycol (MIRALAX / GLYCOLAX) 17 g packet Take 17 g by mouth daily as needed for mild constipation. 01/25/20   [place] Donnamarie Poag, DO  pravastatin (PRAVACHOL) 40 MG tablet Take 1 tablet (40 mg total) by mouth daily at 6 PM. 01/25/20   [place] Donnamarie Poag, DO  predniSONE (DELTASONE) 20 MG tablet Take 20 mg by mouth daily. 03/26/20   [provider]  rOPINIRole (REQUIP XL) 8 MG 24 hr tablet Take 1 tablet (8 mg total) by mouth at bedtime. 11/20/19   Dohmeier, Asencion Partridge, MD  triamcinolone cream (KENALOG) 0.1 % Apply 1 application topically 2 (two) times daily as needed (itching).  06/20/19   [provider]    Physical Exam: Vitals:   06/14/20 1706  BP: 140/65  Pulse: 89  Resp: 16  Temp: 98.6 F (37 C)  TempSrc: Oral  SpO2: 98%  Weight: 99.8 kg  Height: 5' (1.524 m)    Physical Exam Constitutional:      General: She is not in acute distress. HENT:     Head: Normocephalic.  Eyes:     Extraocular Movements: Extraocular movements intact.  Cardiovascular:     Rate and Rhythm: Normal rate and regular rhythm.     Pulses: Normal pulses.  Pulmonary:     Effort: Pulmonary effort is normal. No respiratory distress.     Breath sounds: Normal breath sounds. No wheezing or rales.  Abdominal:     General: Bowel sounds are normal. There is no distension.     Palpations: Abdomen is soft.     Tenderness: There is no abdominal tenderness.  Musculoskeletal:        General: Deformity present.     Cervical back: Normal range of motion and neck supple.     Comments: Left foot: Externally rotated Right foot: Normal strength upon dorsiflexion and plantarflexion. Dorsalis pedis pulse intact bilaterally.  Skin:    General: Skin is warm and dry.  Neurological:     Mental Status: She is alert and oriented to person, place, and time.       Labs on Admission: I have personally reviewed following labs and imaging studies  CBC: Recent Labs  Lab 06/14/20 1634  WBC 10.4  NEUTROABS 9.0*  HGB 11.1*  HCT 37.3  MCV 101.6*  PLT 177   Basic Metabolic Panel: Recent Labs  Lab 06/14/20 1634  NA 143  K 4.8  CL 99  CO2 28  GLUCOSE 132*  BUN 71*  CREATININE 4.81*  CALCIUM 8.4*   GFR: Estimated Creatinine Clearance: 10.9 mL/min (A) (by C-G formula based on SCr of 4.81 mg/dL (H)). Liver Function Tests: Recent Labs  Lab 06/14/20 1634  AST 26  ALT 25  ALKPHOS 91  BILITOT 0.9  PROT 5.6*  ALBUMIN 2.9*   No results for input(s): LIPASE, AMYLASE in the last 168 hours. No  results for input(s): AMMONIA in the last 168 hours. Coagulation Profile: No results for input(s): INR,  PROTIME in the last 168 hours. Cardiac Enzymes: No results for input(s): CKTOTAL, CKMB, CKMBINDEX, TROPONINI in the last 168 hours. BNP (last 3 results) No results for input(s): PROBNP in the last 8760 hours. HbA1C: No results for input(s): HGBA1C in the last 72 hours. CBG: No results for input(s): GLUCAP in the last 168 hours. Lipid Profile: No results for input(s): CHOL, HDL, LDLCALC, TRIG, CHOLHDL, LDLDIRECT in the last 72 hours. Thyroid Function Tests: No results for input(s): TSH, T4TOTAL, FREET4, T3FREE, THYROIDAB in the last 72 hours. Anemia Panel: No results for input(s): VITAMINB12, FOLATE, FERRITIN, TIBC, IRON, RETICCTPCT in the last 72 hours. Urine analysis:    Component Value Date/Time   COLORURINE YELLOW 03/30/2017 1718   APPEARANCEUR CLOUDY (A) 03/30/2017 1718   LABSPEC >1.030 (H) 03/30/2017 1718   PHURINE 6.0 03/30/2017 1718   GLUCOSEU NEGATIVE 03/30/2017 1718   HGBUR LARGE (A) 03/30/2017 1718   BILIRUBINUR NEGATIVE 03/30/2017 1718   KETONESUR NEGATIVE 03/30/2017 1718   PROTEINUR >300 (A) 03/30/2017 1718   UROBILINOGEN 0.2 10/01/2009 1740   NITRITE POSITIVE (A) 03/30/2017 1718   LEUKOCYTESUR MODERATE (A) 03/30/2017 1718    Radiological Exams on Admission: DG Ankle Complete Left  Result Date: 06/14/2020 CLINICAL DATA:  Left ankle deformity. Patient states does not know what happened has been in this position for 2 weeks. EXAM: LEFT ANKLE COMPLETE - 3+ VIEW COMPARISON:  Ankle radiograph 04/24/2017 FINDINGS: Transverse mildly displaced fracture through the medial malleolus. Suspected subtalar dislocation which is better assessed on concurrent foot exam. No definite distal fibular fracture. Tibial talar alignment is grossly maintained. Bones are diffusely under mineralized. There is generalized soft tissue edema of the ankle. IMPRESSION: 1. Transverse mildly displaced fracture through the medial malleolus. 2. Suspected subtalar dislocation which is better assessed on  concurrent foot exam. 3. Generalized soft tissue edema of the ankle. Electronically Signed   By: Keith Rake M.D.   On: 06/14/2020 17:57   DG Foot Complete Left  Result Date: 06/14/2020 CLINICAL DATA:  Left ankle deformity. Patient states does not know what happened has been in this position for 2 weeks. EXAM: LEFT FOOT - COMPLETE 3+ VIEW COMPARISON:  None. FINDINGS: Suspected subtalar dislocation with lateral angulation of the majority of the foot with respect to the talus. Tibial talar alignment is maintained. There is lateral subluxation of the navicular at the talonavicular joint. No obvious acute foot fracture. Suspect remote fifth proximal phalanx fracture. There is a plantar calcaneal spur. Bones are diffusely under mineralized. Generalized soft tissue edema throughout the foot IMPRESSION: 1. Suspected subtalar dislocation with lateral angulation of the majority of the foot with respect to the talus. Lateral subluxation of the navicular at the talonavicular joint. 2. No obvious acute foot fracture. 3. Diffuse osteopenia/osteoporosis with generalized soft tissue edema. Electronically Signed   By: Keith Rake M.D.   On: 06/14/2020 18:02    EKG: Not done in the ED.  EKG has been ordered and currently pending.  Assessment/Plan Principal Problem:   Ankle fracture Active Problems:   Insulin-requiring or dependent type II diabetes mellitus (HCC)   Essential hypertension   End stage renal disease (HCC)   HLD (hyperlipidemia)   Left ankle fracture: X-ray of left ankle showing transverse mildly displaced fracture to the medial malleolus. X-ray of left foot showing suspected subtalar dislocation with lateral angulation of the majority of the foot with respect to the  talus.  Lateral subluxation of the navicular at the talonavicular joint.  No obvious acute foot fracture. ED provider discussed the case with Dr. Charletta Cousin, orthopedics will consult.  He recommended obtaining a CT of the ankle to help  with operative planning. -CT of left ankle pending.  Morphine as needed for pain.  Orthopedics will consult, keep n.p.o. Addendum: N.p.o. after midnight.  History of prior DVT in 2018 -Hold Eliquis at this time in anticipation of surgery  ESRD on HD MWF: No indication for urgent dialysis at this time.  Does not appear significantly volume overloaded.  Potassium and bicarb normal. -Please consult nephrology in the morning for routine dialysis.  Insulin-dependent type 2 diabetes -Check A1c.  Sliding scale insulin very sensitive every 4 hours as patient is currently n.p.o.  Resume home basal insulin after pharmacy med rec is done.  Hypertension -Resume home medication after pharmacy med rec is done  Hyperlipidemia -Resume home medication after pharmacy med rec is done  DVT prophylaxis: On full dose anticoagulation at home.  Hold Eliquis at this time in anticipation of ankle surgery. Code Status: Full code-discussed with the patient. Family Communication: No family available at this time. Disposition Plan: Status is: Inpatient  Remains inpatient appropriate because:Inpatient level of care appropriate due to severity of illness and Needs ankle surgery   Dispo: The patient is from: Independent senior living facility              Anticipated d/c is to: SNF              Anticipated d/c date is: > 3 days              Patient currently is not medically stable to d/c.  The medical decision making on this patient was of high complexity and the patient is at high risk for clinical deterioration, therefore this is a level 3 visit.  Jean Leff MD [facility]  If 7PM-7AM, please contact night-coverage [email or web address]  06/14/2020, 7:57 PM

## 2020-06-14 NOTE — ED Notes (Signed)
Report called to [facility], RN on 5N room 10.

## 2020-06-14 NOTE — Consult Note (Signed)
ORTHOPAEDIC CONSULTATION  REQUESTING PHYSICIAN: Shela Leff, MD  Chief Complaint: "My left ankle hurts"  HPI: Jean Mcclain is a 74 y.o. female who presents with left ankle pain and deformity. She notes ~1 year history of deformity but now has endorsed inability to bear weight on her left foot over the last ~1.5 weeks.  She states that she has not had any specific fall or injury that she can recall.  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   Patient notes history of bilateral TKAs years ago.  Does note knee pain bilaterally but denies any groin/hip pain or significant upper extremity pain.   Past Medical History:  Diagnosis Date  . Anxiety   . Arthritis    osteo  . Chronic kidney disease   . Diabetes mellitus, type 2 (HCC)    Type II  . DVT (deep venous thrombosis) ([place]) 03/2017   left popliteal  . Facet joint disease   . Facet joint disease    foot  . Fibula fracture 02/2017   left  . GERD (gastroesophageal reflux disease)   . Hyperlipidemia   . Hypertension   . OSA (obstructive sleep apnea)    does not use CPAP   . Osteopenia   . Shortness of breath    with exertion  . Venous stasis ulcers ([place])   . Vitamin D deficiency    Past Surgical History:  Procedure Laterality Date  . AV FISTULA PLACEMENT Right 08/20/2018   Procedure: ARTERIOVENOUS (AV) FISTULA CREATION BRACHIOCEPHALIC;  Surgeon: Angelia Mould, MD;  Location: [facility];  Service: Vascular;  Laterality: Right;  . BREAST SURGERY Right 03/1997   biosp[facility]  . CARPAL TUNNEL RELEASE Left 2016  . INSERTION OF MESH N/A 01/08/2013   Procedure: INSERTION OF MESH;  Surgeon: Earnstine Regal, MD;  Location: [facility];  Service: General;  Laterality: N/A;  . IR THORACENTESIS ASP PLEURAL SPACE W/IMG GUIDE  04/06/2017  . JOINT REPLACEMENT Right 1999  . TOTAL KNEE ARTHROPLASTY Left 1999   Left  . TRIGGER FINGER RELEASE Left 11/2014  . TRIGGER FINGER RELEASE  Right   . VENTRAL HERNIA REPAIR N/A 01/08/2013   Procedure: LAPAROSCOPIC VENTRAL HERNIA;  Surgeon: Earnstine Regal, MD;  Location: [facility];  Service: General;  Laterality: N/A;   Social History   Socioeconomic History  . Marital status: Divorced    Spouse name: Not on file  . Number of children: Not on file  . Years of education: Not on file  . Highest education level: Not on file  Occupational History  . Not on file  Tobacco Use  . Smoking status: Former Smoker    Years: 27.00    Types: Cigarettes    Quit date: 10/10/1981    Years since quitting: 38.7  . Smokeless tobacco: Never Used  Vaping Use  . Vaping Use: Never used  Substance and Sexual Activity  . Alcohol use: No  . Drug use: No  . Sexual activity: Not on file  Other Topics Concern  . Not on file  Social History Narrative  . Not on file   Social Determinants of Health   Financial Resource Strain:   . Difficulty of Paying Living Expenses: Not on file  Food Insecurity: No Food Insecurity  . Worried About Charity fundraiser in the Last Year: Never true  . Ran Out of Food in the Last Year: Never true  Transportation Needs:  No Transportation Needs  . Lack of Transportation (Medical): No  . Lack of Transportation (Non-Medical): No  Physical Activity:   . Days of Exercise per Week: Not on file  . Minutes of Exercise per Session: Not on file  Stress:   . Feeling of Stress : Not on file  Social Connections:   . Frequency of Communication with Friends and Family: Not on file  . Frequency of Social Gatherings with Friends and Family: Not on file  . Attends Religious Services: Not on file  . Active Member of Clubs or Organizations: Not on file  . Attends Archivist Meetings: Not on file  . Marital Status: Not on file   Family History  Problem Relation Age of Onset  . Leukemia Mother   . Hypertension Brother   . Emphysema Father    - negative except otherwise stated in the family history section No Known  Allergies Prior to Admission medications   Medication Sig Start Date End Date Taking? Authorizing Provider  acetaminophen (TYLENOL) 500 MG tablet Take 1,000-1,500 mg by mouth 3 (three) times daily as needed for moderate pain or headache.    Yes [provider]  albuterol (VENTOLIN HFA) 108 (90 Base) MCG/ACT inhaler Inhale 2 puffs into the lungs every 6 (six) hours as needed for wheezing or shortness of breath.  03/26/20  Yes [provider]  apixaban (ELIQUIS) 2.5 MG TABS tablet Take 2.5 mg by mouth 2 (two) times daily.   Yes [provider]  calcium acetate (PHOSLO) 667 MG tablet Take 667 mg by mouth See admin instructions. Take one tablet (667 mg) by mouth up to three times daily with meals and with a nutritional shake every night 11/28/19  Yes [provider]  carbidopa-levodopa (SINEMET IR) 10-100 MG tablet Take 1 tablet by mouth in the morning and at bedtime.  11/23/19  Yes [provider]  colchicine 0.6 MG tablet Take 0.6 mg by mouth daily.  02/21/17  Yes [provider]  febuxostat (ULORIC) 40 MG tablet Take 40 mg by mouth daily.   Yes [provider]  gabapentin (NEURONTIN) 300 MG capsule Take 300 mg by mouth 2 (two) times daily.  11/14/19  Yes [provider]  insulin NPH Human (NOVOLIN N) 100 UNIT/ML injection Inject 0.2 mLs (20 Units total) into the skin 2 (two) times daily at 8 am and 10 pm. Patient taking differently: Inject 30-50 Units into the skin See admin instructions. Inject 50 units subcutaneously every morning and 30 units at night 01/25/20  Yes [place] Eric J, DO  insulin regular (NOVOLIN R RELION) 100 units/mL injection Inject 10-20 Units into the skin See admin instructions. Inject 10-25 units subcutaneously 3-4 times daily per sliding scale on chart at home   Yes [provider]  multivitamin (RENA-VIT) TABS tablet Take 1 tablet by mouth daily. 10/09/19  Yes [provider]  omeprazole  (PRILOSEC) 20 MG capsule Take 20 mg by mouth daily before breakfast.    Yes [provider]  OXYGEN Inhale 2 L into the lungs as needed (shortness of breath).   Yes [provider]  predniSONE (DELTASONE) 20 MG tablet Take 20 mg by mouth daily. 03/26/20  Yes [provider]  rOPINIRole (REQUIP) 4 MG tablet Take 4 mg by mouth 2 (two) times daily. 05/27/20  Yes [provider]  B-D INS SYR ULTRAFINE 1CC/30G 30G X 1/2" 1 ML MISC 1 each by Other route daily.  12/23/19   [provider]  Blood Glucose Monitoring Suppl ([place]) w/Device KIT  03/23/20   [provider]  doxercalciferol (HECTOROL) 4 MCG/2ML injection Inject 1 mL (2 mcg total) into the vein every Monday, Wednesday, and Friday with hemodialysis. 01/27/20   [place] Donnamarie Poag, DO  doxycycline (MONODOX) 100 MG capsule Take 100 mg by mouth 2 (two) times daily. 05/27/20   [provider]  glucose blood test strip 1 each by Other route daily.     [provider]  hydrOXYzine (ATARAX/VISTARIL) 10 MG tablet Take 1 tablet (10 mg total) by mouth 2 (two) times daily as needed. Patient not taking: Reported on 06/14/2020 11/20/19   Dohmeier, Asencion Partridge, MD  Lancets [facility] DELICA PLUS [id number]) [place] 1 each by Other route daily.  03/05/19   [provider]  metoprolol succinate (TOPROL-XL) 25 MG 24 hr tablet Take 25 mg by mouth daily. Patient not taking: Reported on 06/14/2020 03/26/20   [provider]  polyethylene glycol (MIRALAX / GLYCOLAX) 17 g packet Take 17 g by mouth daily as needed for mild constipation. Patient not taking: Reported on 06/14/2020 01/25/20   [place] Donnamarie Poag, DO  pravastatin (PRAVACHOL) 40 MG tablet Take 1 tablet (40 mg total) by mouth daily at 6 PM. Patient not taking: Reported on 06/14/2020 01/25/20   [place] Eric J, DO   DG Ankle Complete Left  Result Date: 06/14/2020 CLINICAL DATA:  Left ankle deformity. Patient states does not know what  happened has been in this position for 2 weeks. EXAM: LEFT ANKLE COMPLETE - 3+ VIEW COMPARISON:  Ankle radiograph 04/24/2017 FINDINGS: Transverse mildly displaced fracture through the medial malleolus. Suspected subtalar dislocation which is better assessed on concurrent foot exam. No definite distal fibular fracture. Tibial talar alignment is grossly maintained. Bones are diffusely under mineralized. There is generalized soft tissue edema of the ankle. IMPRESSION: 1. Transverse mildly displaced fracture through the medial malleolus. 2. Suspected subtalar dislocation which is better assessed on concurrent foot exam. 3. Generalized soft tissue edema of the ankle. Electronically Signed   By: Keith Rake M.D.   On: 06/14/2020 17:57   CT Ankle Left Wo Contrast  Result Date: 06/14/2020 CLINICAL DATA:  Ankle pain and fractures EXAM: CT OF THE LEFT ANKLE WITHOUT CONTRAST TECHNIQUE: Multidetector CT imaging of the left ankle was performed according to the standard protocol. Multiplanar CT image reconstructions were also generated. COMPARISON:  Radiographs 06/14/2020 FINDINGS: Despite efforts by the technologist and patient, severe motion artifact is present on today's exam and could not be eliminated. This reduces exam sensitivity and specificity. Bones/Joint/Cartilage Transverse fracture of the medial malleolus. Avulsion fracture from the distal fibula anteriorly in the vicinity of the attachment site of the anterior inferior tibiofibular ligament. The multiplanar reconstructed images suggest the possibility of a posterior malleolar fracture but this is probably mainly due to motion artifact rather than a definite true fracture. As best I can tell there is widening of the space between the talus and the fibula and displacement of the transverse medial malleolar fracture characteristic of instability. If there is an oblique Weber B type lateral malleolar fracture, it remains of cold, with the only well-defined  distal fibular fracture being along the anterior head. Extensive hindfoot and midfoot spurring. Prominent talonavicular valgus with some mild subluxation. Expected alignment at the Lisfranc joint. Vascular remnant along the calcaneal neck. Plantar and Achilles calcaneal spurs. Ligaments Suboptimally assessed by CT. Muscles and Tendons I am skeptical of flexor tendon entrapment along the medial  malleolar fracture although it is difficult to completely exclude given the motion artifact and blurring of the structures. Soft tissues Subcutaneous edema along the ankle. IMPRESSION: 1. Marked motion artifact obscures findings. 2. Transverse fracture of the medial malleolus, with suspected widening of the talofibular space suggesting instability, and a fracture of the anterior fibula likely at the attachment site of the anterior inferior tibiofibular ligament. If there are other fractures, there are obscured by the motion artifact. 3. I am skeptical of flexor tendon entrapment along the medial malleolar fracture although it is difficult to completely exclude given the motion artifact and blurring of the structures. 4. Extensive hindfoot and midfoot spurring. 5. Prominent talonavicular valgus with some mild subluxation. 6. Subcutaneous edema along the ankle. Electronically Signed   By: Van Clines M.D.   On: 06/14/2020 20:12   DG Foot Complete Left  Result Date: 06/14/2020 CLINICAL DATA:  Left ankle deformity. Patient states does not know what happened has been in this position for 2 weeks. EXAM: LEFT FOOT - COMPLETE 3+ VIEW COMPARISON:  None. FINDINGS: Suspected subtalar dislocation with lateral angulation of the majority of the foot with respect to the talus. Tibial talar alignment is maintained. There is lateral subluxation of the navicular at the talonavicular joint. No obvious acute foot fracture. Suspect remote fifth proximal phalanx fracture. There is a plantar calcaneal spur. Bones are diffusely under  mineralized. Generalized soft tissue edema throughout the foot IMPRESSION: 1. Suspected subtalar dislocation with lateral angulation of the majority of the foot with respect to the talus. Lateral subluxation of the navicular at the talonavicular joint. 2. No obvious acute foot fracture. 3. Diffuse osteopenia/osteoporosis with generalized soft tissue edema. Electronically Signed   By: Keith Rake M.D.   On: 06/14/2020 18:02   - pertinent xrays, CT, MRI studies were reviewed and independently interpreted  Positive ROS: All other systems have been reviewed and were otherwise negative with the exception of those mentioned in the HPI and as above.  Physical Exam: General: Alert, no acute distress Psychiatric: Patient is competent for consent with normal mood and affect Lymphatic: No axillary or cervical lymphadenopathy Cardiovascular: No pedal edema Respiratory: No cyanosis, no use of accessory musculature GI: No organomegaly, abdomen is soft and non-tender    Images:  _0 @  Labs:  Lab Results  Component Value Date   HGBA1C 6.3 (H) 01/22/2020   HGBA1C 7.1 (H) 03/11/2017   CRP 5.4 (H) 01/23/2020   LABURIC 1.5 (L) 01/24/2020   LABURIC 5.0 02/09/2017   REPTSTATUS 04/11/2017 FINAL 04/06/2017   REPTSTATUS 04/08/2017 FINAL 04/06/2017   GRAMSTAIN NO WBC SEEN NO ORGANISMS SEEN  04/06/2017   CULT NO GROWTH 5 DAYS 04/06/2017   LABORGA ESCHERICHIA COLI 03/30/2017    Lab Results  Component Value Date   ALBUMIN 2.9 (L) 06/14/2020   ALBUMIN 2.5 (L) 01/24/2020   ALBUMIN 2.9 (L) 03/30/2017   LABURIC 1.5 (L) 01/24/2020   LABURIC 5.0 02/09/2017    Neurologic: Patient does not have protective sensation bilateral lower extremities.   MUSCULOSKELETAL:   Ortho exam demonstrates soft tissue swelling around the left ankle with severe valgus deformity with external rotation.  No shortening of the extremity.  No skin breakdown observed.  There is erythema around the anterolateral  calf with some mildly increased warmth.  No tenderness over this area of redness. Compartments are soft.  Tender to palpation over the medial and lateral mal of the left ankle.  1+ DP pulse palpable in the left ankle.  Able  to actively flex/extend toes bilaterally without significant pain.  Mild to moderate tenderness about the proximal tibia anteriorly bilaterally.  Scars from prior TKAs over the bilateral knees.  No knee effusion present bilaterally.  No pain with hip flexion or log roll of hips bilaterally.  No significant pain with bilateral ROM of hands, wrists, forearms, elbows, shoulders.    Assessment: Closed bimalleolar left ankle fracture with lateral subtalar dislocation  Plan: Patient has extensive medical history.  Admit to hospitalist team, appreciate their management.  CT scan reviewed with Dr. Alphonzo Severance. Case reviewed with Dr. Sharol Given.  Plan to hold Eliquis. Patient will be transferred to [facility] and Dr. Sharol Given will evaluate patient in the morning. NPO after midnight tonight. Last dose of Eliquis was last night.  Will order radiographs of bilateral knees as well too with endorsement of pain and tenderness on exam.   Thank you for the consult and the opportunity to see Ms. [place], [place] [phone] 9:16 PM

## 2020-06-14 NOTE — ED Triage Notes (Signed)
Pt arrived via EMS, from independent senior living facility, c/o ankle pain and generalized weakness. Has been unable to get up to use the bathroom. Does not have assistance at home.

## 2020-06-14 NOTE — ED Provider Notes (Signed)
[facility] DEPT Provider Note   CSN: [id number] Arrival date & time: 06/14/20  1608     History Chief Complaint  Patient presents with  . Weakness    Jean Mcclain is a 74 y.o. female.  The history is provided by the patient, medical records, a relative and the EMS personnel. No language interpreter was used.  Ankle Pain Location:  Ankle and foot Time since incident:  1 week (worskeing one week, deformity several months) Injury: no   Ankle location:  L ankle Foot location:  L foot Pain details:    Quality:  Aching and sharp   Radiates to:  Does not radiate   Severity:  Severe   Onset quality:  Gradual   Timing:  Constant   Progression:  Worsening Chronicity:  New Tetanus status:  Unknown Prior injury to area:  Unable to specify Relieved by:  Immobilization Worsened by:  Bearing weight, extension and flexion Ineffective treatments:  None tried Associated symptoms: no back pain, no fatigue, no fever, no muscle weakness, no neck pain, no numbness, no stiffness, no swelling and no tingling   Risk factors: obesity        Past Medical History:  Diagnosis Date  . Anxiety   . Arthritis    osteo  . Chronic kidney disease   . Diabetes mellitus, type 2 (HCC)    Type II  . DVT (deep venous thrombosis) ([place]) 03/2017   left popliteal  . Facet joint disease   . Facet joint disease    foot  . Fibula fracture 02/2017   left  . GERD (gastroesophageal reflux disease)   . Hyperlipidemia   . Hypertension   . OSA (obstructive sleep apnea)    does not use CPAP   . Osteopenia   . Shortness of breath    with exertion  . Venous stasis ulcers ([place])   . Vitamin D deficiency     Patient Active Problem List   Diagnosis Date Noted  . Dyspnea, unspecified 02/05/2020  . Intractable pain 01/24/2020  . Bilateral shoulder pain 01/23/2020  . Right knee pain 01/23/2020  . Uncontrolled type 2 diabetes mellitus with hyperglycemia, with long-term  current use of insulin ([place]) 01/23/2020  . Pressure injury of skin 01/22/2020  . Mild protein-calorie malnutrition ([place]) 12/16/2019  . Personal history of anaphylaxis 12/05/2019  . Pruritus, unspecified 12/05/2019  . ESRD on hemodialysis ([place]) 11/20/2019  . Myoclonus, segmental 11/20/2019  . Allergy, unspecified, initial encounter 07/29/2019  . Anemia in chronic kidney disease 07/15/2019  . Coagulation defect, unspecified ([place]) 07/13/2019  . Iron deficiency anemia, unspecified 07/11/2019  . Encounter for immunization 07/06/2019  . End stage renal disease ([place]) 07/02/2019  . Gastro-esophageal reflux disease without esophagitis 07/02/2019  . Low back pain 07/02/2019  . Morbid (severe) obesity due to excess calories ([place]) 07/02/2019  . Other specified disorders of bone density and structure, unspecified site 07/02/2019  . Secondary hyperparathyroidism of renal origin ([place]) 07/02/2019  . Unspecified osteoarthritis, unspecified site 07/02/2019  . OSA (obstructive sleep apnea) 03/13/2019  . Chronic acquired lymphedema 11/05/2018  . Acute deep vein thrombosis (DVT) of popliteal vein of left lower extremity ([place]) 03/11/2017  . Insulin-requiring or dependent type II diabetes mellitus ([place]) 03/11/2017  . Essential hypertension 03/11/2017  . Impingement syndrome of left ankle 01/31/2017  . Posterior tibial tendinitis, left leg 01/31/2017  . Incarcerated ventral hernia 01/02/2013    Past Surgical History:  Procedure Laterality Date  . AV FISTULA PLACEMENT  Right 08/20/2018   Procedure: ARTERIOVENOUS (AV) FISTULA CREATION BRACHIOCEPHALIC;  Surgeon: Angelia Mould, MD;  Location: [facility];  Service: Vascular;  Laterality: Right;  . BREAST SURGERY Right 03/1997   biospy  . CARPAL TUNNEL RELEASE Left 2016  . INSERTION OF MESH N/A 01/08/2013   Procedure: INSERTION OF MESH;  Surgeon: Earnstine Regal, MD;  Location: [facility];  Service: General;  Laterality: N/A;  . IR THORACENTESIS ASP PLEURAL SPACE  W/IMG GUIDE  04/06/2017  . JOINT REPLACEMENT Right 1999  . TOTAL KNEE ARTHROPLASTY Left 1999   Left  . TRIGGER FINGER RELEASE Left 11/2014  . TRIGGER FINGER RELEASE Right   . VENTRAL HERNIA REPAIR N/A 01/08/2013   Procedure: LAPAROSCOPIC VENTRAL HERNIA;  Surgeon: Earnstine Regal, MD;  Location: [facility];  Service: General;  Laterality: N/A;     OB History    Gravida  0   Para  0   Term  0   Preterm  0   AB  0   Living  0     SAB  0   TAB  0   Ectopic  0   Multiple  0   Live Births  0           Family History  Problem Relation Age of Onset  . Leukemia Mother   . Hypertension Brother   . Emphysema Father     Social History   Tobacco Use  . Smoking status: Former Smoker    Years: 27.00    Types: Cigarettes    Quit date: 10/10/1981    Years since quitting: 38.7  . Smokeless tobacco: Never Used  Vaping Use  . Vaping Use: Never used  Substance Use Topics  . Alcohol use: No  . Drug use: No    Home Medications Prior to Admission medications   Medication Sig Start Date End Date Taking? Authorizing Provider  acetaminophen (TYLENOL) 500 MG tablet Take 1,000 mg by mouth 3 (three) times daily as needed for moderate pain or headache.    [provider]  albuterol (VENTOLIN HFA) 108 (90 Base) MCG/ACT inhaler Inhale into the lungs. 03/26/20   [provider]  ALPRAZolam Duanne Moron) 0.25 MG tablet Take by mouth. 03/26/20   [provider]  apixaban (ELIQUIS) 2.5 MG TABS tablet Take 2.5 mg by mouth 2 (two) times daily.    [provider]  B-D INS SYR ULTRAFINE 1CC/30G 30G X 1/2" 1 ML MISC 1 each by Other route daily.  12/23/19   [provider]  Blood Glucose Monitoring Suppl ([place]) w/Device KIT  03/23/20   [provider]  calcium acetate (PHOSLO) 667 MG tablet Take 667 mg by mouth 3 (three) times daily. 11/28/19   [provider]  carbidopa-levodopa (SINEMET IR) 10-100 MG tablet Take 1 tablet by  mouth in the morning and at bedtime.  11/23/19   [provider]  colchicine 0.6 MG tablet Take 0.6 mg by mouth daily as needed (gout flare).  02/21/17   [provider]  cyclobenzaprine (FLEXERIL) 10 MG tablet Take 10 mg by mouth at bedtime.     [provider]  doxercalciferol (HECTOROL) 4 MCG/2ML injection Inject 1 mL (2 mcg total) into the vein every Monday, Wednesday, and Friday with hemodialysis. 01/27/20   [place] Eric J, DO  febuxostat (ULORIC) 40 MG tablet Take 40 mg by mouth daily.    [provider]  gabapentin (NEURONTIN) 300 MG capsule Take 300 mg  by mouth at bedtime. 11/14/19   [provider]  glucose blood test strip 1 each by Other route daily.     [provider]  HYDROcodone-Acetaminophen 5-300 MG TABS Take by mouth. 01/27/20   [provider]  hydrOXYzine (ATARAX/VISTARIL) 10 MG tablet Take 1 tablet (10 mg total) by mouth 2 (two) times daily as needed. Patient taking differently: Take 10 mg by mouth 2 (two) times daily as needed for itching or anxiety.  11/20/19   Dohmeier, Asencion Partridge, MD  hydrOXYzine (ATARAX/VISTARIL) 25 MG tablet Take 25 mg by mouth 3 (three) times daily. 03/26/20   [provider]  insulin NPH Human (NOVOLIN N) 100 UNIT/ML injection Inject 0.2 mLs (20 Units total) into the skin 2 (two) times daily at 8 am and 10 pm. 01/25/20   [place] Donnamarie Poag, DO  insulin regular (NOVOLIN R RELION) 100 units/mL injection Inject 10-25 Units into the skin 3 (three) times daily before meals. Take 10 units if  cbg is between 100 to 151 if cbg is over 300 take 25 units per patient    [provider]  Lancets (ONETOUCH DELICA PLUS [id number]) [place] 1 each by Other route daily.  03/05/19   [provider]  lidocaine-prilocaine (EMLA) cream Apply 1 application topically See admin instructions. 1 hour before dialysis to port 09/28/19   [provider]  lovastatin (MEVACOR) 40 MG tablet Take 40 mg by mouth  daily. 03/24/20   [provider]  metoprolol succinate (TOPROL-XL) 25 MG 24 hr tablet Take 25 mg by mouth daily. 03/26/20   [provider]  multivitamin (RENA-VIT) TABS tablet Take 1 tablet by mouth daily. 10/09/19   [provider]  omeprazole (PRILOSEC) 20 MG capsule Take 20 mg by mouth daily before breakfast.     [provider]  oxyCODONE (OXY IR/ROXICODONE) 5 MG immediate release tablet  03/04/20   [provider]  polyethylene glycol (MIRALAX / GLYCOLAX) 17 g packet Take 17 g by mouth daily as needed for mild constipation. 01/25/20   [place] Donnamarie Poag, DO  pravastatin (PRAVACHOL) 40 MG tablet Take 1 tablet (40 mg total) by mouth daily at 6 PM. 01/25/20   [place] Donnamarie Poag, DO  predniSONE (DELTASONE) 20 MG tablet Take 20 mg by mouth daily. 03/26/20   [provider]  rOPINIRole (REQUIP XL) 8 MG 24 hr tablet Take 1 tablet (8 mg total) by mouth at bedtime. 11/20/19   Dohmeier, Asencion Partridge, MD  triamcinolone cream (KENALOG) 0.1 % Apply 1 application topically 2 (two) times daily as needed (itching).  06/20/19   [provider]    Allergies    Patient has no known allergies.  Review of Systems   Review of Systems  Constitutional: Negative for chills, diaphoresis, fatigue and fever.  HENT: Positive for congestion and sore throat.   Eyes: Negative for visual disturbance.  Respiratory: Negative for cough, chest tightness, shortness of breath and wheezing.   Cardiovascular: Negative for chest pain, palpitations and leg swelling.  Gastrointestinal: Negative for constipation, diarrhea, nausea and vomiting.  Genitourinary: Negative for dysuria and flank pain.  Musculoskeletal: Negative for back pain, neck pain, neck stiffness and stiffness.  Skin: Positive for color change. Negative for rash and wound.  Neurological: Negative for light-headedness.  Psychiatric/Behavioral: Negative for agitation and confusion.  All other systems reviewed and are  negative.   Physical Exam Updated Vital Signs BP (!) 162/71   Pulse 87   Temp 98.3 F (36.8 C) (Oral)  Resp 20   Ht 5' (1.524 m)   Wt 99.8 kg   SpO2 93%   BMI 42.97 kg/m   Physical Exam Vitals and nursing note reviewed.  Constitutional:      General: She is not in acute distress.    Appearance: She is well-developed. She is obese. She is not ill-appearing, toxic-appearing or diaphoretic.  HENT:     Head: Normocephalic and atraumatic.     Nose: Congestion present. No rhinorrhea.     Mouth/Throat:     Mouth: Mucous membranes are moist.     Pharynx: No posterior oropharyngeal erythema.  Eyes:     Extraocular Movements: Extraocular movements intact.     Conjunctiva/sclera: Conjunctivae normal.     Pupils: Pupils are equal, round, and reactive to light.  Cardiovascular:     Rate and Rhythm: Normal rate and regular rhythm.     Pulses: Normal pulses.     Heart sounds: No murmur heard.   Pulmonary:     Effort: Pulmonary effort is normal. No respiratory distress.     Breath sounds: Normal breath sounds. No wheezing, rhonchi or rales.  Chest:     Chest wall: No tenderness.  Abdominal:     General: Abdomen is flat.     Palpations: Abdomen is soft.     Tenderness: There is no abdominal tenderness. There is no right CVA tenderness, left CVA tenderness, guarding or rebound.  Musculoskeletal:        General: Tenderness and deformity present.     Cervical back: Neck supple. No tenderness.       Legs:     Comments: Deformity of left ankle.  Some tenderness in the medial inferior ankle where there is some mild erythema and a firm area concerning for bone tenting.  She has no duskiness and has good pulses, sensation, and strength of the foot.  There is pain with palpation and manipulation.  No significant warmth or evidence of infection on my exam.  Skin:    General: Skin is warm and dry.     Findings: Erythema (at medial area of ankle with tenderness) present.  Neurological:      General: No focal deficit present.     Mental Status: She is alert.  Psychiatric:        Mood and Affect: Mood normal.         ED Results / Procedures / Treatments   Labs (all labs ordered are listed, but only abnormal results are displayed) Labs Reviewed  CBC WITH DIFFERENTIAL/PLATELET - Abnormal; Notable for the following components:      Result Value   RBC 3.67 (*)    Hemoglobin 11.1 (*)    MCV 101.6 (*)    MCHC 29.8 (*)    RDW 16.0 (*)    Neutro Abs 9.0 (*)    Lymphs Abs 0.5 (*)    Abs Immature Granulocytes 0.14 (*)    All other components within normal limits  COMPREHENSIVE METABOLIC PANEL - Abnormal; Notable for the following components:   Glucose, Bld 132 (*)    BUN 71 (*)    Creatinine, Ser 4.81 (*)    Calcium 8.4 (*)    Total Protein 5.6 (*)    Albumin 2.9 (*)    GFR calc non Af Amer 8 (*)    GFR calc Af Amer 10 (*)    Anion gap 16 (*)    All other components within normal limits  CBG MONITORING, ED - Abnormal; Notable for  the following components:   Glucose-Capillary 124 (*)    All other components within normal limits  SARS CORONAVIRUS 2 BY RT PCR (HOSPITAL ORDER, [facility])  HEMOGLOBIN A1C    EKG None  Radiology DG Ankle Complete Left  Result Date: 06/14/2020 CLINICAL DATA:  Left ankle deformity. Patient states does not know what happened has been in this position for 2 weeks. EXAM: LEFT ANKLE COMPLETE - 3+ VIEW COMPARISON:  Ankle radiograph 04/24/2017 FINDINGS: Transverse mildly displaced fracture through the medial malleolus. Suspected subtalar dislocation which is better assessed on concurrent foot exam. No definite distal fibular fracture. Tibial talar alignment is grossly maintained. Bones are diffusely under mineralized. There is generalized soft tissue edema of the ankle. IMPRESSION: 1. Transverse mildly displaced fracture through the medial malleolus. 2. Suspected subtalar dislocation which is better assessed on  concurrent foot exam. 3. Generalized soft tissue edema of the ankle. Electronically Signed   By: Keith Rake M.D.   On: 06/14/2020 17:57   CT Ankle Left Wo Contrast  Result Date: 06/14/2020 CLINICAL DATA:  Ankle pain and fractures EXAM: CT OF THE LEFT ANKLE WITHOUT CONTRAST TECHNIQUE: Multidetector CT imaging of the left ankle was performed according to the standard protocol. Multiplanar CT image reconstructions were also generated. COMPARISON:  Radiographs 06/14/2020 FINDINGS: Despite efforts by the technologist and patient, severe motion artifact is present on today's exam and could not be eliminated. This reduces exam sensitivity and specificity. Bones/Joint/Cartilage Transverse fracture of the medial malleolus. Avulsion fracture from the distal fibula anteriorly in the vicinity of the attachment site of the anterior inferior tibiofibular ligament. The multiplanar reconstructed images suggest the possibility of a posterior malleolar fracture but this is probably mainly due to motion artifact rather than a definite true fracture. As best I can tell there is widening of the space between the talus and the fibula and displacement of the transverse medial malleolar fracture characteristic of instability. If there is an oblique Weber B type lateral malleolar fracture, it remains of cold, with the only well-defined distal fibular fracture being along the anterior head. Extensive hindfoot and midfoot spurring. Prominent talonavicular valgus with some mild subluxation. Expected alignment at the Lisfranc joint. Vascular remnant along the calcaneal neck. Plantar and Achilles calcaneal spurs. Ligaments Suboptimally assessed by CT. Muscles and Tendons I am skeptical of flexor tendon entrapment along the medial malleolar fracture although it is difficult to completely exclude given the motion artifact and blurring of the structures. Soft tissues Subcutaneous edema along the ankle. IMPRESSION: 1. Marked motion artifact  obscures findings. 2. Transverse fracture of the medial malleolus, with suspected widening of the talofibular space suggesting instability, and a fracture of the anterior fibula likely at the attachment site of the anterior inferior tibiofibular ligament. If there are other fractures, there are obscured by the motion artifact. 3. I am skeptical of flexor tendon entrapment along the medial malleolar fracture although it is difficult to completely exclude given the motion artifact and blurring of the structures. 4. Extensive hindfoot and midfoot spurring. 5. Prominent talonavicular valgus with some mild subluxation. 6. Subcutaneous edema along the ankle. Electronically Signed   By: Van Clines M.D.   On: 06/14/2020 20:12   DG Foot Complete Left  Result Date: 06/14/2020 CLINICAL DATA:  Left ankle deformity. Patient states does not know what happened has been in this position for 2 weeks. EXAM: LEFT FOOT - COMPLETE 3+ VIEW COMPARISON:  None. FINDINGS: Suspected subtalar dislocation with lateral angulation of the  majority of the foot with respect to the talus. Tibial talar alignment is maintained. There is lateral subluxation of the navicular at the talonavicular joint. No obvious acute foot fracture. Suspect remote fifth proximal phalanx fracture. There is a plantar calcaneal spur. Bones are diffusely under mineralized. Generalized soft tissue edema throughout the foot IMPRESSION: 1. Suspected subtalar dislocation with lateral angulation of the majority of the foot with respect to the talus. Lateral subluxation of the navicular at the talonavicular joint. 2. No obvious acute foot fracture. 3. Diffuse osteopenia/osteoporosis with generalized soft tissue edema. Electronically Signed   By: Keith Rake M.D.   On: 06/14/2020 18:02    Procedures Procedures (including critical care time)  Medications Ordered in ED Medications  acetaminophen (TYLENOL) tablet 650 mg (has no administration in time range)     Or  acetaminophen (TYLENOL) suppository 650 mg (has no administration in time range)  morphine 2 MG/ML injection 1 mg (has no administration in time range)  insulin aspart (novoLOG) injection 0-6 Units (0 Units Subcutaneous Not Given 06/14/20 2021)    ED Course  I have reviewed the triage vital signs and the nursing notes.  Pertinent labs & imaging results that were available during my care of the patient were reviewed by me and considered in my medical decision making (see chart for details).    MDM Rules/Calculators/A&P                          KRISTL MORIOKA is a 74 y.o. female who lives independently facility with a past medical history significant for prior DVT on Eliquis, ESRD on dialysis MWF, diabetes, obesity, sleep apnea, hypertension, hyperlipidemia, GERD, and prior left ankle injury who presents with worsened left ankle and foot pain with decreased mobility and very mild sore throat with some congestion.  Patient reports that she has had deformity of her left ankle for several months but has not seen an orthopedist or any other physicians about that specifically.  She reports that she lives in a billing facility and over the last week it has been worsening.  She reports the pain gets up to 10 out of 10 in severity when she tries to move it or bear any weight.  She does wear a walking boot typically on it.  She reports that she has never had surgery on her left ankle and chart review shows that she may have had some injuries in the past on it.  She reports that she has not any focal trauma on it.  She says that over the last week the pain has been worsening and now she cannot ambulate or bear any weight.  EMS reports that as she is at an filling facility, they cannot do any lifting and so she has just been sitting in her own urine unable to ambulate.  The facility told EMS that they cannot care for her as she cannot bear any weight and they are not equipped to deal with her inability to  help ambulate at this time.  Patient reports that today she has had some mild sore throat and some congestion but denies any fevers, chills, chest, or cough.  She denies any chest pain, shortness of breath, palpitations, nausea vomiting, constipation, diarrhea, or urinary symptoms.  On exam, patient does have tenderness and deformity of the left ankle.  There is some skin discoloration concerning for skin tenting where there may be some bone abutting the skin.  She does  have good sensation and has palpable DP and PT pulses.  No tenderness in the knee or shin area.  Exam otherwise unremarkable with clear breath sounds, nontender chest, nontender abdomen.  She is on her home 3 L and is maintaining oxygen saturations at this time.  She is slightly anxious about this.  Clinically I am concerned that patient may have had worsening fracture and deformity over the last week.  She reports that the deformity has been worsening in her left ankle.  Given her inability to bear weight and the facilities inability to help lift her, I anticipate that we will likely need to discuss evaluation by PT/OT or even admission if she has injuries that need intervention.  We will get a Covid test and labs.  We will get x-rays of the left ankle and foot.  Anticipate reassessment after work-up.  X-ray shows evidence of malleolus fracture.  I spoke to orthopedics who requested CT scan to further characterize injury.  On reassessment there does not appear to be as much tenderness or evidence of skin tenting on my exam.  Orthopedics will come see the patient and she will be admitted for further management.  As she will need dialysis tomorrow, she will be admitted to [facility] for further management.  Hospitalist will admit.   Final Clinical Impression(s) / ED Diagnoses Final diagnoses:  Closed fracture of left ankle, initial encounter    Clinical Impression: 1. Closed fracture of left ankle, initial encounter      Disposition: Admit  This note was prepared with assistance of Dragon voice recognition software. Occasional wrong-word or sound-a-like substitutions may have occurred due to the inherent limitations of voice recognition software.      Mikolaj Woolstenhulme, Gwenyth Allegra, MD 06/14/20 2127

## 2020-06-14 NOTE — ED Notes (Signed)
[facility] left with pt 

## 2020-06-14 NOTE — ED Notes (Signed)
[facility] called for transport. 

## 2020-06-15 ENCOUNTER — Inpatient Hospital Stay (HOSPITAL_COMMUNITY): Payer: HMO

## 2020-06-15 DIAGNOSIS — S82892A Other fracture of left lower leg, initial encounter for closed fracture: Secondary | ICD-10-CM

## 2020-06-15 DIAGNOSIS — M19072 Primary osteoarthritis, left ankle and foot: Secondary | ICD-10-CM

## 2020-06-15 DIAGNOSIS — E785 Hyperlipidemia, unspecified: Secondary | ICD-10-CM

## 2020-06-15 DIAGNOSIS — E119 Type 2 diabetes mellitus without complications: Secondary | ICD-10-CM

## 2020-06-15 DIAGNOSIS — Z794 Long term (current) use of insulin: Secondary | ICD-10-CM

## 2020-06-15 DIAGNOSIS — N186 End stage renal disease: Secondary | ICD-10-CM

## 2020-06-15 DIAGNOSIS — I10 Essential (primary) hypertension: Secondary | ICD-10-CM

## 2020-06-15 LAB — SURGICAL PCR SCREEN
MRSA, PCR: NEGATIVE
Staphylococcus aureus: NEGATIVE

## 2020-06-15 LAB — GLUCOSE, CAPILLARY
Glucose-Capillary: 125 mg/dL — ABNORMAL HIGH (ref 70–99)
Glucose-Capillary: 148 mg/dL — ABNORMAL HIGH (ref 70–99)
Glucose-Capillary: 173 mg/dL — ABNORMAL HIGH (ref 70–99)
Glucose-Capillary: 219 mg/dL — ABNORMAL HIGH (ref 70–99)
Glucose-Capillary: 226 mg/dL — ABNORMAL HIGH (ref 70–99)
Glucose-Capillary: 245 mg/dL — ABNORMAL HIGH (ref 70–99)
Glucose-Capillary: 266 mg/dL — ABNORMAL HIGH (ref 70–99)

## 2020-06-15 MED ORDER — GABAPENTIN 300 MG PO CAPS
300.0000 mg | ORAL_CAPSULE | Freq: Two times a day (BID) | ORAL | Status: DC
  Administered 2020-06-15 – 2020-06-16 (×3): 300 mg via ORAL
  Filled 2020-06-15 (×4): qty 1

## 2020-06-15 MED ORDER — PANTOPRAZOLE SODIUM 40 MG PO TBEC
40.0000 mg | DELAYED_RELEASE_TABLET | Freq: Every day | ORAL | Status: DC
  Administered 2020-06-16 – 2020-06-27 (×10): 40 mg via ORAL
  Filled 2020-06-15 (×11): qty 1

## 2020-06-15 MED ORDER — LEVALBUTEROL HCL 0.63 MG/3ML IN NEBU
0.6300 mg | INHALATION_SOLUTION | Freq: Three times a day (TID) | RESPIRATORY_TRACT | Status: DC | PRN
  Administered 2020-06-15: 0.63 mg via RESPIRATORY_TRACT
  Filled 2020-06-15: qty 3

## 2020-06-15 MED ORDER — ACETAMINOPHEN 500 MG PO TABS: 500.0000 mg | ORAL_TABLET | Freq: Four times a day (QID) | ORAL | Status: DC | PRN

## 2020-06-15 MED ORDER — INSULIN NPH (HUMAN) (ISOPHANE) 100 UNIT/ML ~~LOC~~ SUSP
20.0000 [IU] | Freq: Two times a day (BID) | SUBCUTANEOUS | Status: DC
  Administered 2020-06-15 – 2020-06-16 (×3): 20 [IU] via SUBCUTANEOUS
  Filled 2020-06-15: qty 10

## 2020-06-15 MED ORDER — ZINC OXIDE 12.8 % EX OINT
TOPICAL_OINTMENT | Freq: Four times a day (QID) | CUTANEOUS | Status: DC
  Administered 2020-06-18 – 2020-06-22 (×4): 1 via TOPICAL
  Filled 2020-06-15: qty 56.7

## 2020-06-15 MED ORDER — CHLORHEXIDINE GLUCONATE CLOTH 2 % EX PADS
6.0000 | MEDICATED_PAD | Freq: Every day | CUTANEOUS | Status: DC
  Administered 2020-06-15 – 2020-06-26 (×8): 6 via TOPICAL

## 2020-06-15 MED ORDER — CARBIDOPA-LEVODOPA 10-100 MG PO TABS
1.0000 | ORAL_TABLET | Freq: Two times a day (BID) | ORAL | Status: DC
  Administered 2020-06-15 – 2020-06-27 (×21): 1 via ORAL
  Filled 2020-06-15 (×27): qty 1

## 2020-06-15 MED ORDER — FEBUXOSTAT 40 MG PO TABS
40.0000 mg | ORAL_TABLET | Freq: Every day | ORAL | Status: DC
  Administered 2020-06-16 – 2020-06-27 (×10): 40 mg via ORAL
  Filled 2020-06-15 (×13): qty 1

## 2020-06-15 MED ORDER — COLCHICINE 0.6 MG PO TABS
0.6000 mg | ORAL_TABLET | Freq: Every day | ORAL | Status: DC
  Administered 2020-06-16 – 2020-06-20 (×4): 0.6 mg via ORAL
  Filled 2020-06-15 (×4): qty 1

## 2020-06-15 MED ORDER — ROPINIROLE HCL 1 MG PO TABS
4.0000 mg | ORAL_TABLET | Freq: Two times a day (BID) | ORAL | Status: DC
  Administered 2020-06-15 – 2020-06-27 (×21): 4 mg via ORAL
  Filled 2020-06-15 (×20): qty 4
  Filled 2020-06-15: qty 8
  Filled 2020-06-15 (×6): qty 4

## 2020-06-15 MED ORDER — PREDNISONE 20 MG PO TABS
20.0000 mg | ORAL_TABLET | Freq: Every day | ORAL | Status: DC
  Administered 2020-06-16: 20 mg via ORAL
  Filled 2020-06-15: qty 1

## 2020-06-15 MED ORDER — CALCIUM ACETATE (PHOS BINDER) 667 MG PO CAPS
667.0000 mg | ORAL_CAPSULE | Freq: Every day | ORAL | Status: DC | PRN
  Filled 2020-06-15 (×6): qty 1

## 2020-06-15 MED ORDER — INSULIN ASPART 100 UNIT/ML ~~LOC~~ SOLN
0.0000 [IU] | Freq: Three times a day (TID) | SUBCUTANEOUS | Status: DC
  Administered 2020-06-15: 1 [IU] via SUBCUTANEOUS
  Administered 2020-06-15: 3 [IU] via SUBCUTANEOUS
  Administered 2020-06-16: 1 [IU] via SUBCUTANEOUS
  Administered 2020-06-16: 2 [IU] via SUBCUTANEOUS

## 2020-06-15 MED ORDER — DIPHENHYDRAMINE HCL 25 MG PO CAPS
25.0000 mg | ORAL_CAPSULE | Freq: Once | ORAL | Status: AC
  Administered 2020-06-15: 25 mg via ORAL
  Filled 2020-06-15: qty 1

## 2020-06-15 MED ORDER — RENA-VITE PO TABS
1.0000 | ORAL_TABLET | Freq: Every day | ORAL | Status: DC
  Administered 2020-06-16 – 2020-06-27 (×10): 1 via ORAL
  Filled 2020-06-15 (×11): qty 1

## 2020-06-15 MED ORDER — CALCIUM ACETATE (PHOS BINDER) 667 MG PO CAPS
667.0000 mg | ORAL_CAPSULE | Freq: Three times a day (TID) | ORAL | Status: DC
  Administered 2020-06-15 – 2020-06-26 (×22): 667 mg via ORAL
  Filled 2020-06-15 (×17): qty 1

## 2020-06-15 NOTE — Consult Note (Signed)
ORTHOPAEDIC CONSULTATION  REQUESTING PHYSICIAN: Caren Griffins, MD  Chief Complaint: Left ankle pain chronic with acute left ankle fracture.  HPI: Jean Mcclain is a 74 y.o. female who presents with acute medial malleolar fracture. 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 Patient is seen for evaluation for surgical intervention.  Past Medical History:  Diagnosis Date  . Anxiety   . Arthritis    osteo  . Chronic kidney disease   . Diabetes mellitus, type 2 (HCC)    Type II  . DVT (deep venous thrombosis) ([place]) 03/2017   left popliteal  . Facet joint disease   . Facet joint disease    foot  . Fibula fracture 02/2017   left  . GERD (gastroesophageal reflux disease)   . Hyperlipidemia   . Hypertension   . OSA (obstructive sleep apnea)    does not use CPAP   . Osteopenia   . Shortness of breath    with exertion  . Venous stasis ulcers ([place])   . Vitamin D deficiency    Past Surgical History:  Procedure Laterality Date  . AV FISTULA PLACEMENT Right 08/20/2018   Procedure: ARTERIOVENOUS (AV) FISTULA CREATION BRACHIOCEPHALIC;  Surgeon: Angelia Mould, MD;  Location: [facility];  Service: Vascular;  Laterality: Right;  . BREAST SURGERY Right 03/1997   bi[facility]  . CARPAL TUNNEL RELEASE Left 2016  . INSERTION OF MESH N/A 01/08/2013   Procedure: INSERTION OF MESH;  Surgeon: Earnstine Regal, MD;  Location: [facility];  Service: General;  Laterality: N/A;  . IR THORACENTESIS ASP PLEURAL SPACE W/IMG GUIDE  04/06/2017  . JOINT REPLACEMENT Right 1999  . TOTAL KNEE ARTHROPLASTY Left 1999   Left  . TRIGGER FINGER RELEASE Left 11/2014  . TRIGGER FINGER RELEASE Right   . VENTRAL HERNIA REPAIR N/A 01/08/2013   Procedure: LAPAROSCOPIC VENTRAL HERNIA;  Surgeon: Earnstine Regal,  MD;  Location: [facility];  Service: General;  Laterality: N/A;   Social History   Socioeconomic History  . Marital status: Divorced    Spouse name: Not on file  . Number of children: Not on file  . Years of education: Not on file  . Highest education level: Not on file  Occupational History  . Not on file  Tobacco Use  . Smoking status: Former Smoker    Years: 27.00    Types: Cigarettes    Quit date: 10/10/1981    Years since quitting: 38.7  . Smokeless tobacco: Never Used  Vaping Use  . Vaping Use: Never used  Substance and Sexual Activity  . Alcohol use: No  . Drug use: No  . Sexual activity: Not on file  Other Topics Concern  . Not on file  Social History Narrative  . Not on file   Social Determinants of Health   Financial Resource Strain:   . Difficulty of Paying Living Expenses: Not on file  Food Insecurity: No Food Insecurity  . Worried About Charity fundraiser in the Last Year: Never true  . Ran Out of Food in the Last Year: Never true  Transportation Needs: No Transportation Needs  . Lack of Transportation (Medical): No  . Lack of Transportation (Non-Medical): No  Physical Activity:   . Days of Exercise per Week: Not  on file  . Minutes of Exercise per Session: Not on file  Stress:   . Feeling of Stress : Not on file  Social Connections:   . Frequency of Communication with Friends and Family: Not on file  . Frequency of Social Gatherings with Friends and Family: Not on file  . Attends Religious Services: Not on file  . Active Member of Clubs or Organizations: Not on file  . Attends Archivist Meetings: Not on file  . Marital Status: Not on file   Family History  Problem Relation Age of Onset  . Leukemia Mother   . Hypertension Brother   . Emphysema Father    - negative except otherwise stated in the family history section No Known Allergies Prior to Admission medications   Medication Sig Start Date End Date Taking? Authorizing Provider   acetaminophen (TYLENOL) 500 MG tablet Take 1,000-1,500 mg by mouth 3 (three) times daily as needed for moderate pain or headache.    Yes [provider]  albuterol (VENTOLIN HFA) 108 (90 Base) MCG/ACT inhaler Inhale 2 puffs into the lungs every 6 (six) hours as needed for wheezing or shortness of breath.  03/26/20  Yes [provider]  apixaban (ELIQUIS) 2.5 MG TABS tablet Take 2.5 mg by mouth 2 (two) times daily.   Yes [provider]  calcium acetate (PHOSLO) 667 MG tablet Take 667 mg by mouth See admin instructions. Take one tablet (667 mg) by mouth up to three times daily with meals and with a nutritional shake every night 11/28/19  Yes [provider]  carbidopa-levodopa (SINEMET IR) 10-100 MG tablet Take 1 tablet by mouth in the morning and at bedtime.  11/23/19  Yes [provider]  colchicine 0.6 MG tablet Take 0.6 mg by mouth daily.  02/21/17  Yes [provider]  febuxostat (ULORIC) 40 MG tablet Take 40 mg by mouth daily.   Yes [provider]  gabapentin (NEURONTIN) 300 MG capsule Take 300 mg by mouth 2 (two) times daily.  11/14/19  Yes [provider]  insulin NPH Human (NOVOLIN N) 100 UNIT/ML injection Inject 0.2 mLs (20 Units total) into the skin 2 (two) times daily at 8 am and 10 pm. Patient taking differently: Inject 30-50 Units into the skin See admin instructions. Inject 50 units subcutaneously every morning and 30 units at night 01/25/20  Yes [place] Eric J, DO  insulin regular (NOVOLIN R RELION) 100 units/mL injection Inject 10-20 Units into the skin See admin instructions. Inject 10-25 units subcutaneously 3-4 times daily per sliding scale on chart at home   Yes [provider]  multivitamin (RENA-VIT) TABS tablet Take 1 tablet by mouth daily. 10/09/19  Yes [provider]  omeprazole (PRILOSEC) 20 MG capsule Take 20 mg by mouth daily before breakfast.    Yes [provider]  OXYGEN Inhale  2 L into the lungs as needed (shortness of breath).   Yes [provider]  predniSONE (DELTASONE) 20 MG tablet Take 20 mg by mouth daily. 03/26/20  Yes [provider]  rOPINIRole (REQUIP) 4 MG tablet Take 4 mg by mouth 2 (two) times daily. 05/27/20  Yes [provider]  B-D INS SYR ULTRAFINE 1CC/30G 30G X 1/2" 1 ML MISC 1 each by Other route daily.  12/23/19   [provider]  Blood Glucose Monitoring Suppl ([place]) w/Device KIT  03/23/20   [provider]  doxercalciferol (HECTOROL) 4 MCG/2ML injection Inject 1 mL (  2 mcg total) into the vein every Monday, Wednesday, and Friday with hemodialysis. 01/27/20   [place], Eric J, DO  doxycycline (MONODOX) 100 MG capsule Take 100 mg by mouth 2 (two) times daily. 05/27/20   [provider]  glucose blood test strip 1 each by Other route daily.     [provider]  hydrOXYzine (ATARAX/VISTARIL) 10 MG tablet Take 1 tablet (10 mg total) by mouth 2 (two) times daily as needed. Patient not taking: Reported on 06/14/2020 11/20/19   Dohmeier, Carmen, MD  Lancets (ONETOUCH DELICA PLUS LANCET33G) MISC 1 each by Other route daily.  03/05/19   [provider]  metoprolol succinate (TOPROL-XL) 25 MG 24 hr tablet Take 25 mg by mouth daily. Patient not taking: Reported on 06/14/2020 03/26/20   [provider]  polyethylene glycol (MIRALAX / GLYCOLAX) 17 g packet Take 17 g by mouth daily as needed for mild constipation. Patient not taking: Reported on 06/14/2020 01/25/20   [place], Eric J, DO  pravastatin (PRAVACHOL) 40 MG tablet Take 1 tablet (40 mg total) by mouth daily at 6 PM. Patient not taking: Reported on 06/14/2020 01/25/20   [place], Eric J, DO   DG Ankle Complete Left  Result Date: 06/14/2020 CLINICAL DATA:  Left ankle deformity. Patient states does not know what happened has been in this position for 2 weeks. EXAM: LEFT ANKLE COMPLETE - 3+ VIEW COMPARISON:  Ankle radiograph  04/24/2017 FINDINGS: Transverse mildly displaced fracture through the medial malleolus. Suspected subtalar dislocation which is better assessed on concurrent foot exam. No definite distal fibular fracture. Tibial talar alignment is grossly maintained. Bones are diffusely under mineralized. There is generalized soft tissue edema of the ankle. IMPRESSION: 1. Transverse mildly displaced fracture through the medial malleolus. 2. Suspected subtalar dislocation which is better assessed on concurrent foot exam. 3. Generalized soft tissue edema of the ankle. Electronically Signed   By: Melanie  Sanford M.D.   On: 06/14/2020 17:57   CT Ankle Left Wo Contrast  Result Date: 06/14/2020 CLINICAL DATA:  Ankle pain and fractures EXAM: CT OF THE LEFT ANKLE WITHOUT CONTRAST TECHNIQUE: Multidetector CT imaging of the left ankle was performed according to the standard protocol. Multiplanar CT image reconstructions were also generated. COMPARISON:  Radiographs 06/14/2020 FINDINGS: Despite efforts by the technologist and patient, severe motion artifact is present on today's exam and could not be eliminated. This reduces exam sensitivity and specificity. Bones/Joint/Cartilage Transverse fracture of the medial malleolus. Avulsion fracture from the distal fibula anteriorly in the vicinity of the attachment site of the anterior inferior tibiofibular ligament. The multiplanar reconstructed images suggest the possibility of a posterior malleolar fracture but this is probably mainly due to motion artifact rather than a definite true fracture. As best I can tell there is widening of the space between the talus and the fibula and displacement of the transverse medial malleolar fracture characteristic of instability. If there is an oblique Weber B type lateral malleolar fracture, it remains of cold, with the only well-defined distal fibular fracture being along the anterior head. Extensive hindfoot and midfoot spurring. Prominent talonavicular  valgus with some mild subluxation. Expected alignment at the Lisfranc joint. Vascular remnant along the calcaneal neck. Plantar and Achilles calcaneal spurs. Ligaments Suboptimally assessed by CT. Muscles and Tendons I am skeptical of flexor tendon entrapment along the medial malleolar fracture although it is difficult to completely exclude given the motion artifact and blurring of the structures. Soft tissues Subcutaneous edema along the ankle. IMPRESSION: 1. Marked motion   artifact obscures findings. 2. Transverse fracture of the medial malleolus, with suspected widening of the talofibular space suggesting instability, and a fracture of the anterior fibula likely at the attachment site of the anterior inferior tibiofibular ligament. If there are other fractures, there are obscured by the motion artifact. 3. I am skeptical of flexor tendon entrapment along the medial malleolar fracture although it is difficult to completely exclude given the motion artifact and blurring of the structures. 4. Extensive hindfoot and midfoot spurring. 5. Prominent talonavicular valgus with some mild subluxation. 6. Subcutaneous edema along the ankle. Electronically Signed   By: Walter  Liebkemann M.D.   On: 06/14/2020 20:12   DG CHEST PORT 1 VIEW  Result Date: 06/15/2020 CLINICAL DATA:  Shortness of breath EXAM: PORTABLE CHEST 1 VIEW COMPARISON:  04/06/2017 FINDINGS: Shallow inspiration. Heart size and pulmonary vascularity are normal for technique. Lungs are clear. No pleural effusions. No pneumothorax. Mediastinal contours appear intact. There appear to be anterior shoulder dislocations bilaterally. Old bilateral rib fractures. IMPRESSION: No evidence of active pulmonary disease. Bilateral anterior shoulder dislocations. Electronically Signed   By: William  Stevens M.D.   On: 06/15/2020 03:35   DG Knee Left Port  Result Date: 06/15/2020 CLINICAL DATA:  Knee pain EXAM: PORTABLE LEFT KNEE - 1-2 VIEW COMPARISON:  None. FINDINGS:  Previous left total knee arthroplasty. Patella femoral component is present. Components appear well seated. No evidence of acute fracture or dislocation. No focal bone lesion. No significant effusions. IMPRESSION: Left total knee arthroplasty. Components appear well seated. No acute bony abnormalities. Electronically Signed   By: William  Stevens M.D.   On: 06/15/2020 03:36   DG Knee Right Port  Result Date: 06/15/2020 CLINICAL DATA:  Knee pain EXAM: PORTABLE RIGHT KNEE - 1-2 VIEW COMPARISON:  01/22/2020 FINDINGS: Right total knee arthroplasty including patellar femoral component. Components appear well seated. No evidence of acute fracture or dislocation. No focal bone lesions. No significant effusions. No change since prior study. IMPRESSION: Right total knee arthroplasty. Components appear well seated. No acute bony abnormalities. Electronically Signed   By: William  Stevens M.D.   On: 06/15/2020 03:37   DG Foot Complete Left  Result Date: 06/14/2020 CLINICAL DATA:  Left ankle deformity. Patient states does not know what happened has been in this position for 2 weeks. EXAM: LEFT FOOT - COMPLETE 3+ VIEW COMPARISON:  None. FINDINGS: Suspected subtalar dislocation with lateral angulation of the majority of the foot with respect to the talus. Tibial talar alignment is maintained. There is lateral subluxation of the navicular at the talonavicular joint. No obvious acute foot fracture. Suspect remote fifth proximal phalanx fracture. There is a plantar calcaneal spur. Bones are diffusely under mineralized. Generalized soft tissue edema throughout the foot IMPRESSION: 1. Suspected subtalar dislocation with lateral angulation of the majority of the foot with respect to the talus. Lateral subluxation of the navicular at the talonavicular joint. 2. No obvious acute foot fracture. 3. Diffuse osteopenia/osteoporosis with generalized soft tissue edema. Electronically Signed   By: Melanie  Sanford M.D.   On: 06/14/2020  18:02   - pertinent xrays, CT, MRI studies were reviewed and independently interpreted  Positive ROS: All other systems have been reviewed and were otherwise negative with the exception of those mentioned in the HPI and as above.  Physical Exam: General: Alert, no acute distress Psychiatric: Patient is competent for consent with normal mood and affect Lymphatic: No axillary or cervical lymphadenopathy Cardiovascular: No pedal edema Respiratory: No cyanosis, no use of accessory musculature   GI: No organomegaly, abdomen is soft and non-tender    Images:  _0 @  Labs:  Lab Results  Component Value Date   HGBA1C 6.3 (H) 01/22/2020   HGBA1C 7.1 (H) 03/11/2017   CRP 5.4 (H) 01/23/2020   LABURIC 1.5 (L) 01/24/2020   LABURIC 5.0 02/09/2017   REPTSTATUS 04/11/2017 FINAL 04/06/2017   REPTSTATUS 04/08/2017 FINAL 04/06/2017   GRAMSTAIN NO WBC SEEN NO ORGANISMS SEEN  04/06/2017   CULT NO GROWTH 5 DAYS 04/06/2017   LABORGA ESCHERICHIA COLI 03/30/2017    Lab Results  Component Value Date   ALBUMIN 2.9 (L) 06/14/2020   ALBUMIN 2.5 (L) 01/24/2020   ALBUMIN 2.9 (L) 03/30/2017   LABURIC 1.5 (L) 01/24/2020   LABURIC 5.0 02/09/2017    Neurologic: Patient does not have protective sensation bilateral lower extremities.   MUSCULOSKELETAL:   Skin: Examination patient does have venous stasis swelling but no open ulcers there is brawny edema around the ankle.  Patient has a good dorsalis pedis pulse she has pronation and valgus of the hindfoot.  Review of the radiographs shows a displaced small medial malleolar fracture.  Review of the CT scan shows extensive cystic arthritis involving the tibiotalar subtalar as well as talonavicular joint.  Assessment: Assessment: Chronic ankle and subtalar arthritis with acute medial malleolar fracture.  Plan: Discussed treatment options with immobilization for nonoperative treatment versus internal fixation of the medial malleolus.  Discussed that with both of these options patient still would not be able to weight-bear through her foot secondary to the pain of her chronic arthritis. Discussed that the optimal treatment to restore function would be to proceed with a tibial talar calcaneal fusion. Risks and benefits were discussed including risk of the wound not healing need for additional surgery. Patient states she understands she would like to proceed with fusion. We will plan for surgery on Wednesday.  Thank you for the consult and the opportunity to see Ms. Gregary Cromer, [place] [phone] 8:32 AM

## 2020-06-15 NOTE — Progress Notes (Signed)
Notified X Blount that pt is anxious, short of breath, breath sounds diminished bilaterally in all lobes. VS T. 99.1, 138/59, 84, 26. sats 91% on 2L. Pt also c/o restless legs Yellow MEWS protocol started. RN will continue to monitor.

## 2020-06-15 NOTE — Progress Notes (Signed)
PROGRESS NOTE  Jean Mcclain VOP:[id number] DOB: 1946/07/03 DOA: 06/14/2020 PCP: Leanna Battles, MD   LOS: 1 day   Brief Narrative / Interim history: 74 year old female with history of prior DVT on Eliquis, ESRD on HD MWF, IDDM, morbid obesity with a BMI of 40, OSA not on CPAP, hypertension, hyperlipidemia, GERD, nightly oxygen at home came into the hospital with worsening left ankle and foot pain with decreased mobility. This has been going on for a while, does not really recall an injury to the foot. Imaging of the left foot in the ED showed transverse mildly displaced fracture to the medial malleolus, subtalar dislocation with lateral angulation of the majority of the foot, lateral subluxation of the navicular at the talonavicular joint. Orthopedic surgery was consulted and she was admitted to the hospital  Subjective / 24h Interval events: She is doing well this morning, is hungry and wants breakfast.  Assessment & Plan: Principal Problem Left ankle fracture with significant deformity-orthopedic surgery consulted, I have discussed with Dr. Sharol Given who will take her to the OR on Wednesday for fusion. Pain control minimal  Active Problems History of DVT-hold Eliquis in the setting of surgery  End-stage renal disease on hemodialysis-nephrology consulted  IDDM -start home NPH at a lower dose, placed on sliding scale  Myoclonic jerks - following with Neurology, on Carbodipa - Levodopa   RLS - on requip  She is also on prednisone however patient is not clear as to why.  CBG (last 3)  Recent Labs    06/15/20 0011 06/15/20 0418 06/15/20 0821  GLUCAP 245* 219* 173*   Scheduled Meds: . calcium acetate  667 mg Oral See admin instructions  . carbidopa-levodopa  1 tablet Oral BID  . colchicine  0.6 mg Oral Daily  . febuxostat  40 mg Oral Daily  . gabapentin  300 mg Oral BID  . insulin aspart  0-9 Units Subcutaneous TID WC  . insulin NPH Human  20 Units Subcutaneous BID AC & HS  .  multivitamin  1 tablet Oral Daily  . pantoprazole  40 mg Oral Daily  . predniSONE  20 mg Oral Daily  . rOPINIRole  4 mg Oral BID   Continuous Infusions: PRN Meds:.acetaminophen **OR** acetaminophen, levalbuterol, morphine injection  Diet Orders (From admission, onward)    Start     Ordered   06/15/20 0832  Diet Carb Modified Fluid consistency: Thin; Room service appropriate? Yes  Diet effective now       Question Answer Comment  Diet-HS Snack? Nothing   Calorie Level Medium 1600-2000   Fluid consistency: Thin   Room service appropriate? Yes      06/15/20 0831          DVT prophylaxis:      Code Status: Full Code  Family Communication: d/w patient   Status is: Inpatient  Remains inpatient appropriate because:Inpatient level of care appropriate due to severity of illness   Dispo: The patient is from: Home              Anticipated d/c is to: SNF              Anticipated d/c date is: 3 days              Patient currently is not medically stable to d/c.  Consultants:  Orthopedic surgery   Procedures:  None   Microbiology  None   Antimicrobials: None     Objective: Vitals:   06/15/20 6381 06/15/20 0351 06/15/20 0420  06/15/20 0744  BP:   (!) 126/47 (!) 112/55  Pulse:  82 84 70  Resp: 20 20 18 17   Temp:    98.3 F (36.8 C)  TempSrc:   Oral Oral  SpO2: 96% 96% 94% 90%  Weight:      Height:       No intake or output data in the 24 hours ending 06/15/20 1016 Filed Weights   06/14/20 1706 06/14/20 2209  Weight: 99.8 kg 93.6 kg    Examination:  Constitutional: NAD Eyes: no scleral icterus ENMT: Mucous membranes are moist.  Neck: normal, supple Respiratory: clear to auscultation bilaterally, no wheezing, no crackles. Normal respiratory effort. No accessory muscle use.  Cardiovascular: Regular rate and rhythm, no murmurs / rubs / gallops. No LE edema. Good peripheral pulses Abdomen: non distended, no tenderness. Bowel sounds positive.  Skin: no  rashes Neurologic: non focal    Data Reviewed: I have independently reviewed following labs and imaging studies   CBC: Recent Labs  Lab 06/14/20 1634  WBC 10.4  NEUTROABS 9.0*  HGB 11.1*  HCT 37.3  MCV 101.6*  PLT 175   Basic Metabolic Panel: Recent Labs  Lab 06/14/20 1634  NA 143  K 4.8  CL 99  CO2 28  GLUCOSE 132*  BUN 71*  CREATININE 4.81*  CALCIUM 8.4*   Liver Function Tests: Recent Labs  Lab 06/14/20 1634  AST 26  ALT 25  ALKPHOS 91  BILITOT 0.9  PROT 5.6*  ALBUMIN 2.9*   Coagulation Profile: No results for input(s): INR, PROTIME in the last 168 hours. HbA1C: No results for input(s): HGBA1C in the last 72 hours. CBG: Recent Labs  Lab 06/14/20 2013 06/15/20 0011 06/15/20 0418 06/15/20 0821  GLUCAP 124* 245* 219* 173*    Recent Results (from the past 240 hour(s))  SARS Coronavirus 2 by RT PCR (hospital order, performed in [facility] hospital lab) Nasopharyngeal Nasopharyngeal Swab     Status: None   Collection Time: 06/14/20  4:35 PM   Specimen: Nasopharyngeal Swab  Result Value Ref Range Status   SARS Coronavirus 2 NEGATIVE NEGATIVE Final    Comment: (NOTE) SARS-CoV-2 target nucleic acids are NOT DETECTED.  The SARS-CoV-2 RNA is generally detectable in upper and lower respiratory specimens during the acute phase of infection. The lowest concentration of SARS-CoV-2 viral copies this assay can detect is 250 copies / mL. A negative result does not preclude SARS-CoV-2 infection and should not be used as the sole basis for treatment or other patient management decisions.  A negative result may occur with improper specimen collection / handling, submission of specimen other than nasopharyngeal swab, presence of viral mutation(s) within the areas targeted by this assay, and inadequate number of viral copies (<250 copies / mL). A negative result must be combined with clinical observations, patient history, and epidemiological information.  Fact  Sheet for Patients:   StrictlyIdeas.no  Fact Sheet for Healthcare Providers: BankingDealers.co.za  This test is not yet approved or  cleared by the [place] FDA and has been authorized for detection and/or diagnosis of SARS-CoV-2 by FDA under an Emergency Use Authorization (EUA).  This EUA will remain in effect (meaning this test can be used) for the duration of the COVID-19 declaration under Section 564(b)(1) of the Act, 21 U.S.C. section 360bbb-3(b)(1), unless the authorization is terminated or revoked sooner.  Performed at [facility], [place] [address], [place], [place] [address]   Surgical pcr screen     Status:  None   Collection Time: 06/14/20 10:50 PM   Specimen: Nasal Mucosa; Nasal Swab  Result Value Ref Range Status   MRSA, PCR NEGATIVE NEGATIVE Final   Staphylococcus aureus NEGATIVE NEGATIVE Final    Comment: (NOTE) The Xpert SA Assay (FDA approved for NASAL specimens in patients 56 years of age and older), is one component of a comprehensive surveillance program. It is not intended to diagnose infection nor to guide or monitor treatment. Performed at [facility], [place] [address], [place], [place] [address]      Radiology Studies: DG Ankle Complete Left  Result Date: 06/14/2020 CLINICAL DATA:  Left ankle deformity. Patient states does not know what happened has been in this position for 2 weeks. EXAM: LEFT ANKLE COMPLETE - 3+ VIEW COMPARISON:  Ankle radiograph 04/24/2017 FINDINGS: Transverse mildly displaced fracture through the medial malleolus. Suspected subtalar dislocation which is better assessed on concurrent foot exam. No definite distal fibular fracture. Tibial talar alignment is grossly maintained. Bones are diffusely under mineralized. There is generalized soft tissue edema of the ankle. IMPRESSION: 1. Transverse mildly displaced fracture through the medial malleolus. 2. Suspected subtalar  dislocation which is better assessed on concurrent foot exam. 3. Generalized soft tissue edema of the ankle. Electronically Signed   By: Keith Rake M.D.   On: 06/14/2020 17:57   CT Ankle Left Wo Contrast  Result Date: 06/14/2020 CLINICAL DATA:  Ankle pain and fractures EXAM: CT OF THE LEFT ANKLE WITHOUT CONTRAST TECHNIQUE: Multidetector CT imaging of the left ankle was performed according to the standard protocol. Multiplanar CT image reconstructions were also generated. COMPARISON:  Radiographs 06/14/2020 FINDINGS: Despite efforts by the technologist and patient, severe motion artifact is present on today's exam and could not be eliminated. This reduces exam sensitivity and specificity. Bones/Joint/Cartilage Transverse fracture of the medial malleolus. Avulsion fracture from the distal fibula anteriorly in the vicinity of the attachment site of the anterior inferior tibiofibular ligament. The multiplanar reconstructed images suggest the possibility of a posterior malleolar fracture but this is probably mainly due to motion artifact rather than a definite true fracture. As best I can tell there is widening of the space between the talus and the fibula and displacement of the transverse medial malleolar fracture characteristic of instability. If there is an oblique Weber B type lateral malleolar fracture, it remains of cold, with the only well-defined distal fibular fracture being along the anterior head. Extensive hindfoot and midfoot spurring. Prominent talonavicular valgus with some mild subluxation. Expected alignment at the Lisfranc joint. Vascular remnant along the calcaneal neck. Plantar and Achilles calcaneal spurs. Ligaments Suboptimally assessed by CT. Muscles and Tendons I am skeptical of flexor tendon entrapment along the medial malleolar fracture although it is difficult to completely exclude given the motion artifact and blurring of the structures. Soft tissues Subcutaneous edema along the  ankle. IMPRESSION: 1. Marked motion artifact obscures findings. 2. Transverse fracture of the medial malleolus, with suspected widening of the talofibular space suggesting instability, and a fracture of the anterior fibula likely at the attachment site of the anterior inferior tibiofibular ligament. If there are other fractures, there are obscured by the motion artifact. 3. I am skeptical of flexor tendon entrapment along the medial malleolar fracture although it is difficult to completely exclude given the motion artifact and blurring of the structures. 4. Extensive hindfoot and midfoot spurring. 5. Prominent talonavicular valgus with some mild subluxation. 6. Subcutaneous edema along the ankle. Electronically Signed   By: Van Clines  M.D.   On: 06/14/2020 20:12   DG CHEST PORT 1 VIEW  Result Date: 06/15/2020 CLINICAL DATA:  Shortness of breath EXAM: PORTABLE CHEST 1 VIEW COMPARISON:  04/06/2017 FINDINGS: Shallow inspiration. Heart size and pulmonary vascularity are normal for technique. Lungs are clear. No pleural effusions. No pneumothorax. Mediastinal contours appear intact. There appear to be anterior shoulder dislocations bilaterally. Old bilateral rib fractures. IMPRESSION: No evidence of active pulmonary disease. Bilateral anterior shoulder dislocations. Electronically Signed   By: Lucienne Capers M.D.   On: 06/15/2020 03:35   DG Knee Left Port  Result Date: 06/15/2020 CLINICAL DATA:  Knee pain EXAM: PORTABLE LEFT KNEE - 1-2 VIEW COMPARISON:  None. FINDINGS: Previous left total knee arthroplasty. Patella femoral component is present. Components appear well seated. No evidence of acute fracture or dislocation. No focal bone lesion. No significant effusions. IMPRESSION: Left total knee arthroplasty. Components appear well seated. No acute bony abnormalities. Electronically Signed   By: Lucienne Capers M.D.   On: 06/15/2020 03:36   DG Knee Right Port  Result Date: 06/15/2020 CLINICAL DATA:   Knee pain EXAM: PORTABLE RIGHT KNEE - 1-2 VIEW COMPARISON:  01/22/2020 FINDINGS: Right total knee arthroplasty including patellar femoral component. Components appear well seated. No evidence of acute fracture or dislocation. No focal bone lesions. No significant effusions. No change since prior study. IMPRESSION: Right total knee arthroplasty. Components appear well seated. No acute bony abnormalities. Electronically Signed   By: Lucienne Capers M.D.   On: 06/15/2020 03:37   DG Foot Complete Left  Result Date: 06/14/2020 CLINICAL DATA:  Left ankle deformity. Patient states does not know what happened has been in this position for 2 weeks. EXAM: LEFT FOOT - COMPLETE 3+ VIEW COMPARISON:  None. FINDINGS: Suspected subtalar dislocation with lateral angulation of the majority of the foot with respect to the talus. Tibial talar alignment is maintained. There is lateral subluxation of the navicular at the talonavicular joint. No obvious acute foot fracture. Suspect remote fifth proximal phalanx fracture. There is a plantar calcaneal spur. Bones are diffusely under mineralized. Generalized soft tissue edema throughout the foot IMPRESSION: 1. Suspected subtalar dislocation with lateral angulation of the majority of the foot with respect to the talus. Lateral subluxation of the navicular at the talonavicular joint. 2. No obvious acute foot fracture. 3. Diffuse osteopenia/osteoporosis with generalized soft tissue edema. Electronically Signed   By: Keith Rake M.D.   On: 06/14/2020 18:02    Marzetta Board, MD, PhD [facility]  Between 7 am - 7 pm I am available, please contact me via Amion or Securechat  Between 7 pm - 7 am I am not available, please contact night coverage MD/APP via Amion

## 2020-06-15 NOTE — Plan of Care (Signed)
  Problem: Clinical Measurements: Goal: Ability to maintain clinical measurements within normal limits will improve Outcome: Progressing Goal: Will remain free from infection Outcome: Progressing Goal: Respiratory complications will improve Outcome: Progressing   Problem: Activity: Goal: Risk for activity intolerance will decrease Outcome: Progressing   Problem: Coping: Goal: Level of anxiety will decrease Outcome: Progressing   Problem: Pain Managment: Goal: General experience of comfort will improve Outcome: Progressing   Problem: Safety: Goal: Ability to remain free from injury will improve Outcome: Progressing   Problem: Skin Integrity: Goal: Risk for impaired skin integrity will decrease Outcome: Progressing   

## 2020-06-15 NOTE — Progress Notes (Signed)
Notified X. Blount that pt BP is now 126/47,P. 84, R 18. Patient with no new complaints. RN will continue to monitor.

## 2020-06-15 NOTE — Progress Notes (Signed)
   06/15/20 0241  Assess: MEWS Score  Temp 99.1 F (37.3 C)  BP (!) 138/59  Pulse Rate 81  Resp (!) 26  Level of Consciousness Alert  SpO2 91 %  O2 Device Nasal Cannula  Patient Activity (if Appropriate) In bed  O2 Flow Rate (L/min) 2 L/min  Assess: MEWS Score  MEWS Temp 0  MEWS Systolic 0  MEWS Pulse 0  MEWS RR 2  MEWS LOC 0  MEWS Score 2  MEWS Score Color Yellow  Assess: if the MEWS score is Yellow or Red  Were vital signs taken at a resting state? Yes  Focused Assessment Change from prior assessment (see assessment flowsheet)  Early Detection of Sepsis Score *See Row Information* Low  MEWS guidelines implemented *See Row Information* Yes  Treat  MEWS Interventions Escalated (See documentation below)  Pain Scale 0-10  Pain Score 0  Complains of Restless;Shortness of breath;Itching;Anxiety  Interventions Medication (see MAR);Relaxation;Reposition  Neuro symptoms relieved by Rest  Itching intervention benadryl per order  Patients response to intervention Decreased  Take Vital Signs  Increase Vital Sign Frequency  Yellow: Q 2hr X 2 then Q 4hr X 2, if remains yellow, continue Q 4hrs  Escalate  MEWS: Escalate Yellow: discuss with charge nurse/RN and consider discussing with provider and RRT  Notify: Charge Nurse/RN  Name of Charge Nurse/RN Notified Blanch Media  Date Charge Nurse/RN Notified 06/15/20  Time Charge Nurse/RN Notified 0245  Notify: Provider  Provider Name/Title Jeannette Corpus  Date Provider Notified 06/15/20  Time Provider Notified [phone]  Notification Type Page  Notification Reason Change in status  Response See new orders  Date of Provider Response 06/15/20  Time of Provider Response 0300  Notify: Rapid Response  Name of Rapid Response RN Notified N/A  Document  Patient Outcome Stabilized after interventions  Progress note created (see row info) Yes

## 2020-06-15 NOTE — Consult Note (Signed)
[facility] Renal Consultation Note    Indication for Consultation:  Management of ESRD/hemodialysis, anemia, hypertension/volume, and secondary hyperparathyroidism.  HPI: Jean Mcclain is a 74 y.o. female with PMH including ESRD on dialysis, T2DM, HTN, HLD, OSA, and arthritis who presented to the ED on 06/14/20 with ankle pain and inability to bear weight. She does not recall any injury to the foot and primarily uses a wheelchair. Imaging of the left foot in the ED showed transverse mildly displaced fracture to the medial malleolus, subtalar dislocation with lateral angulation of the majority of the foot, lateral subluxation of the navicular at the talonavicular joint. Seen by ortho and planned for surgery on Wednesday. Nephrology has been consulted for management of ESRD.  Patient denies any SOB at present but is on O2 3L via nasal cannula due to desaturation to low 90's overnight. She reports she is supposed to be on O2 at home but has not used it for about 2 months because she has not felt short of breath. CXR today showed no active pulmonary disease but did show bilateral anterior shoulder dislocations. Labs notable for K+ 4.8, BUN 71, Scr 4.81, Ca 8.4, Alb 2.9, Hgb 11.1.  Denies CP, palpitations, dizziness, abdominal pain, N/V/D. Reports pain in her L ankle, R hip and bilateral shoulders.   Patient dialyzes on MWF scheduled. She missed HD on 9/1 and completed 3.5 hours of prescribed 4:15hr treatment on 9/3. Reports she sometimes has to shorten her treatments due to pain and cramping. No recent issues with AVF.  Past Medical History:  Diagnosis Date  . Anxiety   . Arthritis    osteo  . Chronic kidney disease   . Diabetes mellitus, type 2 (HCC)    Type II  . DVT (deep venous thrombosis) ([place]) 03/2017   left popliteal  . Facet joint disease   . Facet joint disease    foot  . Fibula fracture 02/2017   left  . GERD (gastroesophageal reflux disease)   . Hyperlipidemia   .  Hypertension   . OSA (obstructive sleep apnea)    does not use CPAP   . Osteopenia   . Shortness of breath    with exertion  . Venous stasis ulcers ([place])   . Vitamin D deficiency    Past Surgical History:  Procedure Laterality Date  . AV FISTULA PLACEMENT Right 08/20/2018   Procedure: ARTERIOVENOUS (AV) FISTULA CREATION BRACHIOCEPHALIC;  Surgeon: Angelia Mould, MD;  Location: [facility];  Service: Vascular;  Laterality: Right;  . BREAST SURGERY Right 03/1997   biospy  . CARPAL TUNNEL RELEASE Left 2016  . INSERTION OF MESH N/A 01/08/2013   Procedure: INSERTION OF MESH;  Surgeon: Earnstine Regal, MD;  Location: [facility];  Service: General;  Laterality: N/A;  . IR THORACENTESIS ASP PLEURAL SPACE W/IMG GUIDE  04/06/2017  . JOINT REPLACEMENT Right 1999  . TOTAL KNEE ARTHROPLASTY Left 1999   Left  . TRIGGER FINGER RELEASE Left 11/2014  . TRIGGER FINGER RELEASE Right   . VENTRAL HERNIA REPAIR N/A 01/08/2013   Procedure: LAPAROSCOPIC VENTRAL HERNIA;  Surgeon: Earnstine Regal, MD;  Location: [facility];  Service: General;  Laterality: N/A;   Family History  Problem Relation Age of Onset  . Leukemia Mother   . Hypertension Brother   . Emphysema Father    Social History:  reports that she quit smoking about 38 years ago. Her smoking use included cigarettes. She quit after 27.00 years of use. She has  never used smokeless tobacco. She reports that she does not drink alcohol and does not use drugs.  ROS: As per HPI otherwise negative.  Physical Exam: Vitals:   06/15/20 0334 06/15/20 0351 06/15/20 0420 06/15/20 0744  BP:   (!) 126/47 (!) 112/55  Pulse:  82 84 70  Resp: '20 20 18 17  ' Temp:    98.3 F (36.8 C)  TempSrc:   Oral Oral  SpO2: 96% 96% 94% 90%  Weight:      Height:         General: Well developed, chronically ill appearing, in no acute distress. Head: Normocephalic, atraumatic, sclera non-icteric, mucus membranes are moist. Neck: Supple without lymphadenopathy/masses. JVD not  elevated. Lungs: Clear bilaterally to auscultation without wheezes, rales, or rhonchi. Breathing is unlabored on O2 via [place]. Heart: RRR with normal S1, S2. No murmurs, rubs, or gallops appreciated. Abdomen: Soft, non-tender, non-distended with normoactive bowel sounds. No rebound/guarding. No obvious abdominal masses. Lower extremities: Trace non-pitting edema b/l lower extremities, L ankle wrapped, R transmetatarsal amputation Neuro: Alert and oriented X 3. Moves all extremities spontaneously. Psych:  Responds to questions appropriately with a normal affect. Dialysis Access: RUE AVF + bruit  No Known Allergies Prior to Admission medications   Medication Sig Start Date End Date Taking? Authorizing Provider  acetaminophen (TYLENOL) 500 MG tablet Take 1,000-1,500 mg by mouth 3 (three) times daily as needed for moderate pain or headache.    Yes [provider]  albuterol (VENTOLIN HFA) 108 (90 Base) MCG/ACT inhaler Inhale 2 puffs into the lungs every 6 (six) hours as needed for wheezing or shortness of breath.  03/26/20  Yes [provider]  apixaban (ELIQUIS) 2.5 MG TABS tablet Take 2.5 mg by mouth 2 (two) times daily.   Yes [provider]  calcium acetate (PHOSLO) 667 MG tablet Take 667 mg by mouth See admin instructions. Take one tablet (667 mg) by mouth up to three times daily with meals and with a nutritional shake every night 11/28/19  Yes [provider]  carbidopa-levodopa (SINEMET IR) 10-100 MG tablet Take 1 tablet by mouth in the morning and at bedtime.  11/23/19  Yes [provider]  colchicine 0.6 MG tablet Take 0.6 mg by mouth daily.  02/21/17  Yes [provider]  febuxostat (ULORIC) 40 MG tablet Take 40 mg by mouth daily.   Yes [provider]  gabapentin (NEURONTIN) 300 MG capsule Take 300 mg by mouth 2 (two) times daily.  11/14/19  Yes [provider]  insulin NPH Human (NOVOLIN N) 100 UNIT/ML injection Inject 0.2 mLs  (20 Units total) into the skin 2 (two) times daily at 8 am and 10 pm. Patient taking differently: Inject 30-50 Units into the skin See admin instructions. Inject 50 units subcutaneously every morning and 30 units at night 01/25/20  Yes [place] Eric J, DO  insulin regular (NOVOLIN R RELION) 100 units/mL injection Inject 10-20 Units into the skin See admin instructions. Inject 10-25 units subcutaneously 3-4 times daily per sliding scale on chart at home   Yes [provider]  multivitamin (RENA-VIT) TABS tablet Take 1 tablet by mouth daily. 10/09/19  Yes [provider]  omeprazole (PRILOSEC) 20 MG capsule Take 20 mg by mouth daily before breakfast.    Yes [provider]  OXYGEN Inhale 2 L into the lungs as needed (shortness of breath).   Yes [provider]  predniSONE (DELTASONE) 20 MG tablet Take 20 mg by mouth daily.  03/26/20  Yes [provider]  rOPINIRole (REQUIP) 4 MG tablet Take 4 mg by mouth 2 (two) times daily. 05/27/20  Yes [provider]  B-D INS SYR ULTRAFINE 1CC/30G 30G X 1/2" 1 ML MISC 1 each by Other route daily.  12/23/19   [provider]  Blood Glucose Monitoring Suppl ([place]) w/Device KIT  03/23/20   [provider]  doxercalciferol (HECTOROL) 4 MCG/2ML injection Inject 1 mL (2 mcg total) into the vein every Monday, Wednesday, and Friday with hemodialysis. 01/27/20   [place] Donnamarie Poag, DO  doxycycline (MONODOX) 100 MG capsule Take 100 mg by mouth 2 (two) times daily. 05/27/20   [provider]  glucose blood test strip 1 each by Other route daily.     [provider]  hydrOXYzine (ATARAX/VISTARIL) 10 MG tablet Take 1 tablet (10 mg total) by mouth 2 (two) times daily as needed. Patient not taking: Reported on 06/14/2020 11/20/19   Dohmeier, Asencion Partridge, MD  Lancets [facility] DELICA PLUS [id number]) [place] 1 each by Other route daily.  03/05/19   [provider]  metoprolol succinate  (TOPROL-XL) 25 MG 24 hr tablet Take 25 mg by mouth daily. Patient not taking: Reported on 06/14/2020 03/26/20   [provider]  polyethylene glycol (MIRALAX / GLYCOLAX) 17 g packet Take 17 g by mouth daily as needed for mild constipation. Patient not taking: Reported on 06/14/2020 01/25/20   [place] Donnamarie Poag, DO  pravastatin (PRAVACHOL) 40 MG tablet Take 1 tablet (40 mg total) by mouth daily at 6 PM. Patient not taking: Reported on 06/14/2020 01/25/20   [place] Eric J, DO   Current Facility-Administered Medications  Medication Dose Route Frequency Provider Last Rate Last Admin  . acetaminophen (TYLENOL) tablet 650 mg  650 mg Oral Q6H PRN Shela Leff, MD       Or  . acetaminophen (TYLENOL) suppository 650 mg  650 mg Rectal Q6H PRN Shela Leff, MD      . calcium acetate (PHOSLO) capsule 667 mg  667 mg Oral TID with meals Cruzita Lederer, Vella Redhead, MD      . calcium acetate (PHOSLO) capsule 667 mg  667 mg Oral Daily PRN Blenda Nicely, RPH      . carbidopa-levodopa (SINEMET IR) 10-100 MG per tablet immediate release 1 tablet  1 tablet Oral BID Caren Griffins, MD      . colchicine tablet 0.6 mg  0.6 mg Oral Daily Gherghe, Costin M, MD      . febuxostat (ULORIC) tablet 40 mg  40 mg Oral Daily Gherghe, Vella Redhead, MD      . gabapentin (NEURONTIN) capsule 300 mg  300 mg Oral BID Gherghe, Costin M, MD      . insulin aspart (novoLOG) injection 0-9 Units  0-9 Units Subcutaneous TID WC Gherghe, Costin M, MD      . insulin NPH Human (NOVOLIN N) injection 20 Units  20 Units Subcutaneous BID AC & HS Gherghe, Costin M, MD      . levalbuterol (XOPENEX) nebulizer solution 0.63 mg  0.63 mg Nebulization Q8H PRN Lovey Newcomer T, NP   0.63 mg at 06/15/20 0324  . morphine 2 MG/ML injection 1 mg  1 mg Intravenous Q3H PRN Shela Leff, MD   1 mg at 06/15/20 0010  . multivitamin (RENA-VIT) tablet 1 tablet  1 tablet Oral Daily Gherghe, Costin M, MD      . pantoprazole (PROTONIX) EC tablet 40 mg  40 mg  Oral  Daily Caren Griffins, MD      . predniSONE (DELTASONE) tablet 20 mg  20 mg Oral Daily Gherghe, Costin M, MD      . rOPINIRole (REQUIP) tablet 4 mg  4 mg Oral BID Caren Griffins, MD      . Zinc Oxide (TRIPLE PASTE) 12.8 % ointment   Topical QID Caren Griffins, MD       Labs: Basic Metabolic Panel: Recent Labs  Lab 06/14/20 1634  NA 143  K 4.8  CL 99  CO2 28  GLUCOSE 132*  BUN 71*  CREATININE 4.81*  CALCIUM 8.4*   Liver Function Tests: Recent Labs  Lab 06/14/20 1634  AST 26  ALT 25  ALKPHOS 91  BILITOT 0.9  PROT 5.6*  ALBUMIN 2.9*   CBC: Recent Labs  Lab 06/14/20 1634  WBC 10.4  NEUTROABS 9.0*  HGB 11.1*  HCT 37.3  MCV 101.6*  PLT 239   CBG: Recent Labs  Lab 06/14/20 2013 06/15/20 0011 06/15/20 0418 06/15/20 0821  GLUCAP 124* 245* 219* 173*   Studies/Results: DG Ankle Complete Left  Result Date: 06/14/2020 CLINICAL DATA:  Left ankle deformity. Patient states does not know what happened has been in this position for 2 weeks. EXAM: LEFT ANKLE COMPLETE - 3+ VIEW COMPARISON:  Ankle radiograph 04/24/2017 FINDINGS: Transverse mildly displaced fracture through the medial malleolus. Suspected subtalar dislocation which is better assessed on concurrent foot exam. No definite distal fibular fracture. Tibial talar alignment is grossly maintained. Bones are diffusely under mineralized. There is generalized soft tissue edema of the ankle. IMPRESSION: 1. Transverse mildly displaced fracture through the medial malleolus. 2. Suspected subtalar dislocation which is better assessed on concurrent foot exam. 3. Generalized soft tissue edema of the ankle. Electronically Signed   By: Keith Rake M.D.   On: 06/14/2020 17:57   CT Ankle Left Wo Contrast  Result Date: 06/14/2020 CLINICAL DATA:  Ankle pain and fractures EXAM: CT OF THE LEFT ANKLE WITHOUT CONTRAST TECHNIQUE: Multidetector CT imaging of the left ankle was performed according to the standard protocol.  Multiplanar CT image reconstructions were also generated. COMPARISON:  Radiographs 06/14/2020 FINDINGS: Despite efforts by the technologist and patient, severe motion artifact is present on today's exam and could not be eliminated. This reduces exam sensitivity and specificity. Bones/Joint/Cartilage Transverse fracture of the medial malleolus. Avulsion fracture from the distal fibula anteriorly in the vicinity of the attachment site of the anterior inferior tibiofibular ligament. The multiplanar reconstructed images suggest the possibility of a posterior malleolar fracture but this is probably mainly due to motion artifact rather than a definite true fracture. As best I can tell there is widening of the space between the talus and the fibula and displacement of the transverse medial malleolar fracture characteristic of instability. If there is an oblique Weber B type lateral malleolar fracture, it remains of cold, with the only well-defined distal fibular fracture being along the anterior head. Extensive hindfoot and midfoot spurring. Prominent talonavicular valgus with some mild subluxation. Expected alignment at the Lisfranc joint. Vascular remnant along the calcaneal neck. Plantar and Achilles calcaneal spurs. Ligaments Suboptimally assessed by CT. Muscles and Tendons I am skeptical of flexor tendon entrapment along the medial malleolar fracture although it is difficult to completely exclude given the motion artifact and blurring of the structures. Soft tissues Subcutaneous edema along the ankle. IMPRESSION: 1. Marked motion artifact obscures findings. 2. Transverse fracture of the medial malleolus, with suspected widening of the talofibular space suggesting instability,  and a fracture of the anterior fibula likely at the attachment site of the anterior inferior tibiofibular ligament. If there are other fractures, there are obscured by the motion artifact. 3. I am skeptical of flexor tendon entrapment along the  medial malleolar fracture although it is difficult to completely exclude given the motion artifact and blurring of the structures. 4. Extensive hindfoot and midfoot spurring. 5. Prominent talonavicular valgus with some mild subluxation. 6. Subcutaneous edema along the ankle. Electronically Signed   By: Van Clines M.D.   On: 06/14/2020 20:12   DG CHEST PORT 1 VIEW  Result Date: 06/15/2020 CLINICAL DATA:  Shortness of breath EXAM: PORTABLE CHEST 1 VIEW COMPARISON:  04/06/2017 FINDINGS: Shallow inspiration. Heart size and pulmonary vascularity are normal for technique. Lungs are clear. No pleural effusions. No pneumothorax. Mediastinal contours appear intact. There appear to be anterior shoulder dislocations bilaterally. Old bilateral rib fractures. IMPRESSION: No evidence of active pulmonary disease. Bilateral anterior shoulder dislocations. Electronically Signed   By: Lucienne Capers M.D.   On: 06/15/2020 03:35   DG Knee Left Port  Result Date: 06/15/2020 CLINICAL DATA:  Knee pain EXAM: PORTABLE LEFT KNEE - 1-2 VIEW COMPARISON:  None. FINDINGS: Previous left total knee arthroplasty. Patella femoral component is present. Components appear well seated. No evidence of acute fracture or dislocation. No focal bone lesion. No significant effusions. IMPRESSION: Left total knee arthroplasty. Components appear well seated. No acute bony abnormalities. Electronically Signed   By: Lucienne Capers M.D.   On: 06/15/2020 03:36   DG Knee Right Port  Result Date: 06/15/2020 CLINICAL DATA:  Knee pain EXAM: PORTABLE RIGHT KNEE - 1-2 VIEW COMPARISON:  01/22/2020 FINDINGS: Right total knee arthroplasty including patellar femoral component. Components appear well seated. No evidence of acute fracture or dislocation. No focal bone lesions. No significant effusions. No change since prior study. IMPRESSION: Right total knee arthroplasty. Components appear well seated. No acute bony abnormalities. Electronically Signed    By: Lucienne Capers M.D.   On: 06/15/2020 03:37   DG Foot Complete Left  Result Date: 06/14/2020 CLINICAL DATA:  Left ankle deformity. Patient states does not know what happened has been in this position for 2 weeks. EXAM: LEFT FOOT - COMPLETE 3+ VIEW COMPARISON:  None. FINDINGS: Suspected subtalar dislocation with lateral angulation of the majority of the foot with respect to the talus. Tibial talar alignment is maintained. There is lateral subluxation of the navicular at the talonavicular joint. No obvious acute foot fracture. Suspect remote fifth proximal phalanx fracture. There is a plantar calcaneal spur. Bones are diffusely under mineralized. Generalized soft tissue edema throughout the foot IMPRESSION: 1. Suspected subtalar dislocation with lateral angulation of the majority of the foot with respect to the talus. Lateral subluxation of the navicular at the talonavicular joint. 2. No obvious acute foot fracture. 3. Diffuse osteopenia/osteoporosis with generalized soft tissue edema. Electronically Signed   By: Keith Rake M.D.   On: 06/14/2020 18:02    Dialysis Orders:  [facility] on MWF 180NRe 4 hours 15 min BFR 400 DFR 800 EDW 90.5kg 2K/2Ca, AVF 15g Hep 3000 unit bolus Hectorol 2 mcg IV q HD   Assessment/Plan: 1. 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 Sharol Given, planned for tibial talar calcaneal fusion on Wednesday.  2.  ESRD:  On MWF schedule. On O2 at present but denies any SOB, CXR clear. Has not had full HD treatment in 1 week but labs are fairly stable.  Will plan for HD today per regular schedule, next HD likely Wednesday but can plan around surgery. Please renally dose medications.  3.  Hypertension/volume: BP controlled, trace edema but hx of chronic lymphedema. UFG 2L with HD today. 4.  Anemia: Hgb 11.1. No indication for ESA at this time.  5.  Metabolic bone disease: Corrected calcium at goal. Continue  hectorol. Continue calcium acetate and follow phos level.  6.  Nutrition:  Renal/carb modified diet.  7. T2DM: On insulin, per primary team  Anice Paganini, PA-C 06/15/2020, 11:16 AM  [facility] Pager: [phone]

## 2020-06-15 NOTE — Progress Notes (Addendum)
Patient short of breath, anxious and restless. Also c/o itching on abdomen, no change in skin upon assessment. Respirations 26. Yellow MEWS generated and protocol started. Notified X.Blount and orders received and done. At 0334 respirations 20 after xopenex breathing nebulizer treatment done. Tiffany from respiratory came to assess pt. Pt stable after interventions.    Addendum. See complete note in note dated 06/15/2020, timed at  0416.    06/15/20 0241  Document  Progress note created (see row info) Yes

## 2020-06-15 NOTE — Progress Notes (Signed)
Notified X Blount that pt is c/o itching on abdomen- nothing found on assessment. Also, pt  c/o inability to sleep or get comfortable. RN will continue to monitor.

## 2020-06-15 NOTE — Consult Note (Signed)
[facility] Nurse Consult Note: Patient evaluated in room 5N10.  Reason for Consult: Buttocks and MASD Wound type: MASD-IAD, Purewick in use. Bilateral posterior upper thighs damaged by MASD-IAD.  Wound bed: Pink Drainage (amount, consistency, odor) None Periwound: Fragile and macerated.  Dressing procedure/placement/frequency: Using no rinse spray, cleanse buttocks and bilateral posterior upper thighs. Apply triple paste. I have also entered an order for mattress with low air loss feature.   Monitor the wound area(s) for worsening of condition such as: Signs/symptoms of infection,  Increase in size,  Development of or worsening of odor, Development of pain, or increased pain at the affected locations.  Notify the medical team if any of these develop.  Thank you for the consult.  Discussed plan of care with the patient and bedside nurse.  [place] nurse will not follow at this time.  Please re-consult the [place] team if needed.  Cathlean Marseilles Tamala Julian, MSN, RN, CMSRN, [place], [facility] Wound Treatment Associate Wound, Ostomy, Continence Nurse Office [phone]

## 2020-06-15 NOTE — H&P (View-Only) (Signed)
 ORTHOPAEDIC CONSULTATION  REQUESTING PHYSICIAN: Gherghe, Costin M, MD  Chief Complaint: Left ankle pain chronic with acute left ankle fracture.  HPI: Jean Mcclain is a 74 y.o. female who presents with acute medial malleolar fracture. 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 Patient is seen for evaluation for surgical intervention.  Past Medical History:  Diagnosis Date  . Anxiety   . Arthritis    osteo  . Chronic kidney disease   . Diabetes mellitus, type 2 (HCC)    Type II  . DVT (deep venous thrombosis) (HCC) 03/2017   left popliteal  . Facet joint disease   . Facet joint disease    foot  . Fibula fracture 02/2017   left  . GERD (gastroesophageal reflux disease)   . Hyperlipidemia   . Hypertension   . OSA (obstructive sleep apnea)    does not use CPAP   . Osteopenia   . Shortness of breath    with exertion  . Venous stasis ulcers (HCC)   . Vitamin D deficiency    Past Surgical History:  Procedure Laterality Date  . AV FISTULA PLACEMENT Right 08/20/2018   Procedure: ARTERIOVENOUS (AV) FISTULA CREATION BRACHIOCEPHALIC;  Surgeon: Dickson, Christopher S, MD;  Location: [facility] OR;  Service: Vascular;  Laterality: Right;  . BREAST SURGERY Right 03/1997   [facility]  . CARPAL TUNNEL RELEASE Left 2016  . INSERTION OF MESH N/A 01/08/2013   Procedure: INSERTION OF MESH;  Surgeon: Todd M Gerkin, MD;  Location: [facility];  Service: General;  Laterality: N/A;  . IR THORACENTESIS ASP PLEURAL SPACE W/IMG GUIDE  04/06/2017  . JOINT REPLACEMENT Right 1999  . TOTAL KNEE ARTHROPLASTY Left 1999   Left  . TRIGGER FINGER RELEASE Left 11/2014  . TRIGGER FINGER RELEASE Right   . VENTRAL HERNIA REPAIR N/A 01/08/2013   Procedure: LAPAROSCOPIC VENTRAL HERNIA;  Surgeon: Todd M Gerkin,  MD;  Location: [facility];  Service: General;  Laterality: N/A;   Social History   Socioeconomic History  . Marital status: Divorced    Spouse name: Not on file  . Number of children: Not on file  . Years of education: Not on file  . Highest education level: Not on file  Occupational History  . Not on file  Tobacco Use  . Smoking status: Former Smoker    Years: 27.00    Types: Cigarettes    Quit date: 10/10/1981    Years since quitting: 38.7  . Smokeless tobacco: Never Used  Vaping Use  . Vaping Use: Never used  Substance and Sexual Activity  . Alcohol use: No  . Drug use: No  . Sexual activity: Not on file  Other Topics Concern  . Not on file  Social History Narrative  . Not on file   Social Determinants of Health   Financial Resource Strain:   . Difficulty of Paying Living Expenses: Not on file  Food Insecurity: No Food Insecurity  . Worried About Running Out of Food in the Last Year: Never true  . Ran Out of Food in the Last Year: Never true  Transportation Needs: No Transportation Needs  . Lack of Transportation (Medical): No  . Lack of Transportation (Non-Medical): No  Physical Activity:   . Days of Exercise per Week: Not   on file  . Minutes of Exercise per Session: Not on file  Stress:   . Feeling of Stress : Not on file  Social Connections:   . Frequency of Communication with Friends and Family: Not on file  . Frequency of Social Gatherings with Friends and Family: Not on file  . Attends Religious Services: Not on file  . Active Member of Clubs or Organizations: Not on file  . Attends Club or Organization Meetings: Not on file  . Marital Status: Not on file   Family History  Problem Relation Age of Onset  . Leukemia Mother   . Hypertension Brother   . Emphysema Father    - negative except otherwise stated in the family history section No Known Allergies Prior to Admission medications   Medication Sig Start Date End Date Taking? Authorizing Provider   acetaminophen (TYLENOL) 500 MG tablet Take 1,000-1,500 mg by mouth 3 (three) times daily as needed for moderate pain or headache.    Yes [provider]  albuterol (VENTOLIN HFA) 108 (90 Base) MCG/ACT inhaler Inhale 2 puffs into the lungs every 6 (six) hours as needed for wheezing or shortness of breath.  03/26/20  Yes [provider]  apixaban (ELIQUIS) 2.5 MG TABS tablet Take 2.5 mg by mouth 2 (two) times daily.   Yes [provider]  calcium acetate (PHOSLO) 667 MG tablet Take 667 mg by mouth See admin instructions. Take one tablet (667 mg) by mouth up to three times daily with meals and with a nutritional shake every night 11/28/19  Yes [provider]  carbidopa-levodopa (SINEMET IR) 10-100 MG tablet Take 1 tablet by mouth in the morning and at bedtime.  11/23/19  Yes [provider]  colchicine 0.6 MG tablet Take 0.6 mg by mouth daily.  02/21/17  Yes [provider]  febuxostat (ULORIC) 40 MG tablet Take 40 mg by mouth daily.   Yes [provider]  gabapentin (NEURONTIN) 300 MG capsule Take 300 mg by mouth 2 (two) times daily.  11/14/19  Yes [provider]  insulin NPH Human (NOVOLIN N) 100 UNIT/ML injection Inject 0.2 mLs (20 Units total) into the skin 2 (two) times daily at 8 am and 10 pm. Patient taking differently: Inject 30-50 Units into the skin See admin instructions. Inject 50 units subcutaneously every morning and 30 units at night 01/25/20  Yes [place], Eric J, DO  insulin regular (NOVOLIN R RELION) 100 units/mL injection Inject 10-20 Units into the skin See admin instructions. Inject 10-25 units subcutaneously 3-4 times daily per sliding scale on chart at home   Yes [provider]  multivitamin (RENA-VIT) TABS tablet Take 1 tablet by mouth daily. 10/09/19  Yes [provider]  omeprazole (PRILOSEC) 20 MG capsule Take 20 mg by mouth daily before breakfast.    Yes [provider]  OXYGEN Inhale  2 L into the lungs as needed (shortness of breath).   Yes [provider]  predniSONE (DELTASONE) 20 MG tablet Take 20 mg by mouth daily. 03/26/20  Yes [provider]  rOPINIRole (REQUIP) 4 MG tablet Take 4 mg by mouth 2 (two) times daily. 05/27/20  Yes [provider]  B-D INS SYR ULTRAFINE 1CC/30G 30G X 1/2" 1 ML MISC 1 each by Other route daily.  12/23/19   [provider]  Blood Glucose Monitoring Suppl (ONETOUCH VERIO FLEX SYSTEM) w/Device KIT  03/23/20   [provider]  doxercalciferol (HECTOROL) 4 MCG/2ML injection Inject 1 mL (  2 mcg total) into the vein every Monday, Wednesday, and Friday with hemodialysis. 01/27/20   [place], Eric J, DO  doxycycline (MONODOX) 100 MG capsule Take 100 mg by mouth 2 (two) times daily. 05/27/20   [provider]  glucose blood test strip 1 each by Other route daily.     [provider]  hydrOXYzine (ATARAX/VISTARIL) 10 MG tablet Take 1 tablet (10 mg total) by mouth 2 (two) times daily as needed. Patient not taking: Reported on 06/14/2020 11/20/19   Dohmeier, Carmen, MD  Lancets (ONETOUCH DELICA PLUS LANCET33G) MISC 1 each by Other route daily.  03/05/19   [provider]  metoprolol succinate (TOPROL-XL) 25 MG 24 hr tablet Take 25 mg by mouth daily. Patient not taking: Reported on 06/14/2020 03/26/20   [provider]  polyethylene glycol (MIRALAX / GLYCOLAX) 17 g packet Take 17 g by mouth daily as needed for mild constipation. Patient not taking: Reported on 06/14/2020 01/25/20   [place], Eric J, DO  pravastatin (PRAVACHOL) 40 MG tablet Take 1 tablet (40 mg total) by mouth daily at 6 PM. Patient not taking: Reported on 06/14/2020 01/25/20   [place], Eric J, DO   DG Ankle Complete Left  Result Date: 06/14/2020 CLINICAL DATA:  Left ankle deformity. Patient states does not know what happened has been in this position for 2 weeks. EXAM: LEFT ANKLE COMPLETE - 3+ VIEW COMPARISON:  Ankle radiograph  04/24/2017 FINDINGS: Transverse mildly displaced fracture through the medial malleolus. Suspected subtalar dislocation which is better assessed on concurrent foot exam. No definite distal fibular fracture. Tibial talar alignment is grossly maintained. Bones are diffusely under mineralized. There is generalized soft tissue edema of the ankle. IMPRESSION: 1. Transverse mildly displaced fracture through the medial malleolus. 2. Suspected subtalar dislocation which is better assessed on concurrent foot exam. 3. Generalized soft tissue edema of the ankle. Electronically Signed   By: Melanie  Sanford M.D.   On: 06/14/2020 17:57   CT Ankle Left Wo Contrast  Result Date: 06/14/2020 CLINICAL DATA:  Ankle pain and fractures EXAM: CT OF THE LEFT ANKLE WITHOUT CONTRAST TECHNIQUE: Multidetector CT imaging of the left ankle was performed according to the standard protocol. Multiplanar CT image reconstructions were also generated. COMPARISON:  Radiographs 06/14/2020 FINDINGS: Despite efforts by the technologist and patient, severe motion artifact is present on today's exam and could not be eliminated. This reduces exam sensitivity and specificity. Bones/Joint/Cartilage Transverse fracture of the medial malleolus. Avulsion fracture from the distal fibula anteriorly in the vicinity of the attachment site of the anterior inferior tibiofibular ligament. The multiplanar reconstructed images suggest the possibility of a posterior malleolar fracture but this is probably mainly due to motion artifact rather than a definite true fracture. As best I can tell there is widening of the space between the talus and the fibula and displacement of the transverse medial malleolar fracture characteristic of instability. If there is an oblique Weber B type lateral malleolar fracture, it remains of cold, with the only well-defined distal fibular fracture being along the anterior head. Extensive hindfoot and midfoot spurring. Prominent talonavicular  valgus with some mild subluxation. Expected alignment at the Lisfranc joint. Vascular remnant along the calcaneal neck. Plantar and Achilles calcaneal spurs. Ligaments Suboptimally assessed by CT. Muscles and Tendons I am skeptical of flexor tendon entrapment along the medial malleolar fracture although it is difficult to completely exclude given the motion artifact and blurring of the structures. Soft tissues Subcutaneous edema along the ankle. IMPRESSION: 1. Marked motion   artifact obscures findings. 2. Transverse fracture of the medial malleolus, with suspected widening of the talofibular space suggesting instability, and a fracture of the anterior fibula likely at the attachment site of the anterior inferior tibiofibular ligament. If there are other fractures, there are obscured by the motion artifact. 3. I am skeptical of flexor tendon entrapment along the medial malleolar fracture although it is difficult to completely exclude given the motion artifact and blurring of the structures. 4. Extensive hindfoot and midfoot spurring. 5. Prominent talonavicular valgus with some mild subluxation. 6. Subcutaneous edema along the ankle. Electronically Signed   By: Walter  Liebkemann M.D.   On: 06/14/2020 20:12   DG CHEST PORT 1 VIEW  Result Date: 06/15/2020 CLINICAL DATA:  Shortness of breath EXAM: PORTABLE CHEST 1 VIEW COMPARISON:  04/06/2017 FINDINGS: Shallow inspiration. Heart size and pulmonary vascularity are normal for technique. Lungs are clear. No pleural effusions. No pneumothorax. Mediastinal contours appear intact. There appear to be anterior shoulder dislocations bilaterally. Old bilateral rib fractures. IMPRESSION: No evidence of active pulmonary disease. Bilateral anterior shoulder dislocations. Electronically Signed   By: William  Stevens M.D.   On: 06/15/2020 03:35   DG Knee Left Port  Result Date: 06/15/2020 CLINICAL DATA:  Knee pain EXAM: PORTABLE LEFT KNEE - 1-2 VIEW COMPARISON:  None. FINDINGS:  Previous left total knee arthroplasty. Patella femoral component is present. Components appear well seated. No evidence of acute fracture or dislocation. No focal bone lesion. No significant effusions. IMPRESSION: Left total knee arthroplasty. Components appear well seated. No acute bony abnormalities. Electronically Signed   By: William  Stevens M.D.   On: 06/15/2020 03:36   DG Knee Right Port  Result Date: 06/15/2020 CLINICAL DATA:  Knee pain EXAM: PORTABLE RIGHT KNEE - 1-2 VIEW COMPARISON:  01/22/2020 FINDINGS: Right total knee arthroplasty including patellar femoral component. Components appear well seated. No evidence of acute fracture or dislocation. No focal bone lesions. No significant effusions. No change since prior study. IMPRESSION: Right total knee arthroplasty. Components appear well seated. No acute bony abnormalities. Electronically Signed   By: William  Stevens M.D.   On: 06/15/2020 03:37   DG Foot Complete Left  Result Date: 06/14/2020 CLINICAL DATA:  Left ankle deformity. Patient states does not know what happened has been in this position for 2 weeks. EXAM: LEFT FOOT - COMPLETE 3+ VIEW COMPARISON:  None. FINDINGS: Suspected subtalar dislocation with lateral angulation of the majority of the foot with respect to the talus. Tibial talar alignment is maintained. There is lateral subluxation of the navicular at the talonavicular joint. No obvious acute foot fracture. Suspect remote fifth proximal phalanx fracture. There is a plantar calcaneal spur. Bones are diffusely under mineralized. Generalized soft tissue edema throughout the foot IMPRESSION: 1. Suspected subtalar dislocation with lateral angulation of the majority of the foot with respect to the talus. Lateral subluxation of the navicular at the talonavicular joint. 2. No obvious acute foot fracture. 3. Diffuse osteopenia/osteoporosis with generalized soft tissue edema. Electronically Signed   By: Melanie  Sanford M.D.   On: 06/14/2020  18:02   - pertinent xrays, CT, MRI studies were reviewed and independently interpreted  Positive ROS: All other systems have been reviewed and were otherwise negative with the exception of those mentioned in the HPI and as above.  Physical Exam: General: Alert, no acute distress Psychiatric: Patient is competent for consent with normal mood and affect Lymphatic: No axillary or cervical lymphadenopathy Cardiovascular: No pedal edema Respiratory: No cyanosis, no use of accessory musculature   GI: No organomegaly, abdomen is soft and non-tender    Images:  @ENCIMAGES@  Labs:  Lab Results  Component Value Date   HGBA1C 6.3 (H) 01/22/2020   HGBA1C 7.1 (H) 03/11/2017   CRP 5.4 (H) 01/23/2020   LABURIC 1.5 (L) 01/24/2020   LABURIC 5.0 02/09/2017   REPTSTATUS 04/11/2017 FINAL 04/06/2017   REPTSTATUS 04/08/2017 FINAL 04/06/2017   GRAMSTAIN NO WBC SEEN NO ORGANISMS SEEN  04/06/2017   CULT NO GROWTH 5 DAYS 04/06/2017   LABORGA ESCHERICHIA COLI 03/30/2017    Lab Results  Component Value Date   ALBUMIN 2.9 (L) 06/14/2020   ALBUMIN 2.5 (L) 01/24/2020   ALBUMIN 2.9 (L) 03/30/2017   LABURIC 1.5 (L) 01/24/2020   LABURIC 5.0 02/09/2017    Neurologic: Patient does not have protective sensation bilateral lower extremities.   MUSCULOSKELETAL:   Skin: Examination patient does have venous stasis swelling but no open ulcers there is brawny edema around the ankle.  Patient has a good dorsalis pedis pulse she has pronation and valgus of the hindfoot.  Review of the radiographs shows a displaced small medial malleolar fracture.  Review of the CT scan shows extensive cystic arthritis involving the tibiotalar subtalar as well as talonavicular joint.  Assessment: Assessment: Chronic ankle and subtalar arthritis with acute medial malleolar fracture.  Plan: Discussed treatment options with immobilization for nonoperative treatment versus internal fixation of the medial malleolus.  Discussed that with both of these options patient still would not be able to weight-bear through her foot secondary to the pain of her chronic arthritis. Discussed that the optimal treatment to restore function would be to proceed with a tibial talar calcaneal fusion. Risks and benefits were discussed including risk of the wound not healing need for additional surgery. Patient states she understands she would like to proceed with fusion. We will plan for surgery on Wednesday.  Thank you for the consult and the opportunity to see Jean Mcclain  Nithin Demeo, MD [facility] [phone] 8:32 AM      

## 2020-06-16 ENCOUNTER — Other Ambulatory Visit: Payer: Self-pay | Admitting: Physician Assistant

## 2020-06-16 ENCOUNTER — Encounter (HOSPITAL_COMMUNITY): Payer: Self-pay | Admitting: Internal Medicine

## 2020-06-16 ENCOUNTER — Other Ambulatory Visit: Payer: Self-pay

## 2020-06-16 LAB — BASIC METABOLIC PANEL
Anion gap: 12 (ref 5–15)
BUN: 81 mg/dL — ABNORMAL HIGH (ref 8–23)
CO2: 29 mmol/L (ref 22–32)
Calcium: 8.2 mg/dL — ABNORMAL LOW (ref 8.9–10.3)
Chloride: 99 mmol/L (ref 98–111)
Creatinine, Ser: 5.06 mg/dL — ABNORMAL HIGH (ref 0.44–1.00)
GFR calc Af Amer: 9 mL/min — ABNORMAL LOW (ref >60)
GFR calc non Af Amer: 8 mL/min — ABNORMAL LOW (ref >60)
Glucose, Bld: 204 mg/dL — ABNORMAL HIGH (ref 70–99)
Potassium: 4.3 mmol/L (ref 3.5–5.1)
Sodium: 140 mmol/L (ref 135–145)

## 2020-06-16 LAB — CBC
HCT: 33 % — ABNORMAL LOW (ref 36.0–46.0)
Hemoglobin: 9.9 g/dL — ABNORMAL LOW (ref 12.0–15.0)
MCH: 30.4 pg (ref 26.0–34.0)
MCHC: 30 g/dL (ref 30.0–36.0)
MCV: 101.2 fL — ABNORMAL HIGH (ref 80.0–100.0)
Platelets: 215 10*3/uL (ref 150–400)
RBC: 3.26 MIL/uL — ABNORMAL LOW (ref 3.87–5.11)
RDW: 15.9 % — ABNORMAL HIGH (ref 11.5–15.5)
WBC: 10 10*3/uL (ref 4.0–10.5)
nRBC: 0 % (ref 0.0–0.2)

## 2020-06-16 LAB — GLUCOSE, CAPILLARY
Glucose-Capillary: 104 mg/dL — ABNORMAL HIGH (ref 70–99)
Glucose-Capillary: 129 mg/dL — ABNORMAL HIGH (ref 70–99)
Glucose-Capillary: 191 mg/dL — ABNORMAL HIGH (ref 70–99)
Glucose-Capillary: 172 mg/dL — ABNORMAL HIGH (ref 70–99)

## 2020-06-16 LAB — HEPATITIS B SURFACE ANTIGEN: Hepatitis B Surface Ag: NONREACTIVE

## 2020-06-16 MED ORDER — POLYETHYLENE GLYCOL 3350 17 G PO PACK
17.0000 g | PACK | Freq: Every day | ORAL | Status: DC
  Administered 2020-06-16 – 2020-06-22 (×4): 17 g via ORAL
  Filled 2020-06-16 (×6): qty 1

## 2020-06-16 MED ORDER — CHLORHEXIDINE GLUCONATE CLOTH 2 % EX PADS
6.0000 | MEDICATED_PAD | Freq: Every day | CUTANEOUS | Status: DC
  Administered 2020-06-16 – 2020-06-17 (×2): 6 via TOPICAL

## 2020-06-16 MED ORDER — ONDANSETRON HCL 4 MG/2ML IJ SOLN
4.0000 mg | Freq: Four times a day (QID) | INTRAMUSCULAR | Status: DC | PRN
  Administered 2020-06-16 – 2020-06-24 (×5): 4 mg via INTRAVENOUS
  Filled 2020-06-16 (×4): qty 2

## 2020-06-16 MED ORDER — HEPARIN SODIUM (PORCINE) 1000 UNIT/ML IJ SOLN
3000.0000 [IU] | Freq: Once | INTRAMUSCULAR | Status: AC
  Administered 2020-06-16: 3000 [IU] via INTRAVENOUS

## 2020-06-16 MED ORDER — DARBEPOETIN ALFA 40 MCG/0.4ML IJ SOSY
40.0000 ug | PREFILLED_SYRINGE | INTRAMUSCULAR | Status: DC
  Filled 2020-06-16 (×2): qty 0.4

## 2020-06-16 MED ORDER — HEPARIN SODIUM (PORCINE) 1000 UNIT/ML IJ SOLN
INTRAMUSCULAR | Status: AC
  Filled 2020-06-16: qty 3

## 2020-06-16 NOTE — Discharge Instructions (Signed)

## 2020-06-16 NOTE — Plan of Care (Signed)
  Problem: Education: Goal: Knowledge of General Education information will improve Description: Including pain rating scale, medication(s)/side effects and non-pharmacologic comfort measures Outcome: Progressing   Problem: Clinical Measurements: Goal: Respiratory complications will improve Outcome: Progressing Goal: Cardiovascular complication will be avoided Outcome: Progressing   Problem: Activity: Goal: Risk for activity intolerance will decrease Outcome: Progressing   Problem: Coping: Goal: Level of anxiety will decrease Outcome: Progressing   Problem: Elimination: Goal: Will not experience complications related to urinary retention Outcome: Progressing   Problem: Pain Managment: Goal: General experience of comfort will improve Outcome: Progressing   Problem: Safety: Goal: Ability to remain free from injury will improve Outcome: Progressing   Problem: Skin Integrity: Goal: Risk for impaired skin integrity will decrease Outcome: Progressing

## 2020-06-16 NOTE — Progress Notes (Signed)
[facility] Progress Note   Subjective:   Seen at end of HD, had dialysis early this AM due to high census. Reports dialysis went well, no issues. Denies SOB, CP, palpitations, abdominal pain, N/V/D.   Objective Vitals:   06/16/20 0530 06/16/20 0537 06/16/20 0600 06/16/20 0630  BP: (!) 79/38 (!) 102/33 (!) 117/53 (!) 121/57  Pulse: 80 74 61 75  Resp:      Temp:      TempSrc:      SpO2:      Weight:      Height:       Physical Exam General: Well developed female, alert and in NAD Heart: RRR, no murmurs, rubs or gallops Lungs: CTA bilaterally without wheezing, rhonchi or rales Abdomen: Soft, non-tender, non-distended, +BS Extremities: Trace pitting edema bilateral lower extremities Dialysis Access: RUE AVF accessed  Additional Objective Labs: Basic Metabolic Panel: Recent Labs  Lab 06/14/20 1634 06/16/20 0212  NA 143 140  K 4.8 4.3  CL 99 99  CO2 28 29  GLUCOSE 132* 204*  BUN 71* 81*  CREATININE 4.81* 5.06*  CALCIUM 8.4* 8.2*   Liver Function Tests: Recent Labs  Lab 06/14/20 1634  AST 26  ALT 25  ALKPHOS 91  BILITOT 0.9  PROT 5.6*  ALBUMIN 2.9*   CBC: Recent Labs  Lab 06/14/20 1634 06/16/20 0212  WBC 10.4 10.0  NEUTROABS 9.0*  --   HGB 11.1* 9.9*  HCT 37.3 33.0*  MCV 101.6* 101.2*  PLT 239 215   Blood Culture    Component Value Date/Time   SDES FLUID LEFT PLEURAL 04/06/2017 1248   SDES FLUID LEFT PLEURAL 04/06/2017 1248   SPECREQUEST NONE 04/06/2017 1248   SPECREQUEST NONE 04/06/2017 1248   CULT NO GROWTH 5 DAYS 04/06/2017 1248   REPTSTATUS 04/11/2017 FINAL 04/06/2017 1248   REPTSTATUS 04/08/2017 FINAL 04/06/2017 1248    CBG: Recent Labs  Lab 06/15/20 0821 06/15/20 1136 06/15/20 1622 06/15/20 2018 06/15/20 2257  GLUCAP 173* 226* 148* 125* 266*    Studies/Results: DG Ankle Complete Left  Result Date: 06/14/2020 CLINICAL DATA:  Left ankle deformity. Patient states does not know what happened has been in this  position for 2 weeks. EXAM: LEFT ANKLE COMPLETE - 3+ VIEW COMPARISON:  Ankle radiograph 04/24/2017 FINDINGS: Transverse mildly displaced fracture through the medial malleolus. Suspected subtalar dislocation which is better assessed on concurrent foot exam. No definite distal fibular fracture. Tibial talar alignment is grossly maintained. Bones are diffusely under mineralized. There is generalized soft tissue edema of the ankle. IMPRESSION: 1. Transverse mildly displaced fracture through the medial malleolus. 2. Suspected subtalar dislocation which is better assessed on concurrent foot exam. 3. Generalized soft tissue edema of the ankle. Electronically Signed   By: Keith Rake M.D.   On: 06/14/2020 17:57   CT Ankle Left Wo Contrast  Result Date: 06/14/2020 CLINICAL DATA:  Ankle pain and fractures EXAM: CT OF THE LEFT ANKLE WITHOUT CONTRAST TECHNIQUE: Multidetector CT imaging of the left ankle was performed according to the standard protocol. Multiplanar CT image reconstructions were also generated. COMPARISON:  Radiographs 06/14/2020 FINDINGS: Despite efforts by the technologist and patient, severe motion artifact is present on today's exam and could not be eliminated. This reduces exam sensitivity and specificity. Bones/Joint/Cartilage Transverse fracture of the medial malleolus. Avulsion fracture from the distal fibula anteriorly in the vicinity of the attachment site of the anterior inferior tibiofibular ligament. The multiplanar reconstructed images suggest the possibility of a posterior malleolar fracture but  this is probably mainly due to motion artifact rather than a definite true fracture. As best I can tell there is widening of the space between the talus and the fibula and displacement of the transverse medial malleolar fracture characteristic of instability. If there is an oblique Weber B type lateral malleolar fracture, it remains of cold, with the only well-defined distal fibular fracture being  along the anterior head. Extensive hindfoot and midfoot spurring. Prominent talonavicular valgus with some mild subluxation. Expected alignment at the Lisfranc joint. Vascular remnant along the calcaneal neck. Plantar and Achilles calcaneal spurs. Ligaments Suboptimally assessed by CT. Muscles and Tendons I am skeptical of flexor tendon entrapment along the medial malleolar fracture although it is difficult to completely exclude given the motion artifact and blurring of the structures. Soft tissues Subcutaneous edema along the ankle. IMPRESSION: 1. Marked motion artifact obscures findings. 2. Transverse fracture of the medial malleolus, with suspected widening of the talofibular space suggesting instability, and a fracture of the anterior fibula likely at the attachment site of the anterior inferior tibiofibular ligament. If there are other fractures, there are obscured by the motion artifact. 3. I am skeptical of flexor tendon entrapment along the medial malleolar fracture although it is difficult to completely exclude given the motion artifact and blurring of the structures. 4. Extensive hindfoot and midfoot spurring. 5. Prominent talonavicular valgus with some mild subluxation. 6. Subcutaneous edema along the ankle. Electronically Signed   By: Van Clines M.D.   On: 06/14/2020 20:12   DG CHEST PORT 1 VIEW  Result Date: 06/15/2020 CLINICAL DATA:  Shortness of breath EXAM: PORTABLE CHEST 1 VIEW COMPARISON:  04/06/2017 FINDINGS: Shallow inspiration. Heart size and pulmonary vascularity are normal for technique. Lungs are clear. No pleural effusions. No pneumothorax. Mediastinal contours appear intact. There appear to be anterior shoulder dislocations bilaterally. Old bilateral rib fractures. IMPRESSION: No evidence of active pulmonary disease. Bilateral anterior shoulder dislocations. Electronically Signed   By: Lucienne Capers M.D.   On: 06/15/2020 03:35   DG Knee Left Port  Result Date:  06/15/2020 CLINICAL DATA:  Knee pain EXAM: PORTABLE LEFT KNEE - 1-2 VIEW COMPARISON:  None. FINDINGS: Previous left total knee arthroplasty. Patella femoral component is present. Components appear well seated. No evidence of acute fracture or dislocation. No focal bone lesion. No significant effusions. IMPRESSION: Left total knee arthroplasty. Components appear well seated. No acute bony abnormalities. Electronically Signed   By: Lucienne Capers M.D.   On: 06/15/2020 03:36   DG Knee Right Port  Result Date: 06/15/2020 CLINICAL DATA:  Knee pain EXAM: PORTABLE RIGHT KNEE - 1-2 VIEW COMPARISON:  01/22/2020 FINDINGS: Right total knee arthroplasty including patellar femoral component. Components appear well seated. No evidence of acute fracture or dislocation. No focal bone lesions. No significant effusions. No change since prior study. IMPRESSION: Right total knee arthroplasty. Components appear well seated. No acute bony abnormalities. Electronically Signed   By: Lucienne Capers M.D.   On: 06/15/2020 03:37   DG Foot Complete Left  Result Date: 06/14/2020 CLINICAL DATA:  Left ankle deformity. Patient states does not know what happened has been in this position for 2 weeks. EXAM: LEFT FOOT - COMPLETE 3+ VIEW COMPARISON:  None. FINDINGS: Suspected subtalar dislocation with lateral angulation of the majority of the foot with respect to the talus. Tibial talar alignment is maintained. There is lateral subluxation of the navicular at the talonavicular joint. No obvious acute foot fracture. Suspect remote fifth proximal phalanx fracture. There is a plantar  calcaneal spur. Bones are diffusely under mineralized. Generalized soft tissue edema throughout the foot IMPRESSION: 1. Suspected subtalar dislocation with lateral angulation of the majority of the foot with respect to the talus. Lateral subluxation of the navicular at the talonavicular joint. 2. No obvious acute foot fracture. 3. Diffuse osteopenia/osteoporosis with  generalized soft tissue edema. Electronically Signed   By: Keith Rake M.D.   On: 06/14/2020 18:02   Medications:  . calcium acetate  667 mg Oral TID with meals  . carbidopa-levodopa  1 tablet Oral BID  . Chlorhexidine Gluconate Cloth  6 each Topical Q0600  . colchicine  0.6 mg Oral Daily  . febuxostat  40 mg Oral Daily  . gabapentin  300 mg Oral BID  . heparin sodium (porcine)      . insulin aspart  0-9 Units Subcutaneous TID WC  . insulin NPH Human  20 Units Subcutaneous BID AC & HS  . multivitamin  1 tablet Oral Daily  . pantoprazole  40 mg Oral Daily  . predniSONE  20 mg Oral Daily  . rOPINIRole  4 mg Oral BID  . Zinc Oxide   Topical QID    Dialysis Orders: [facility] on MWF 180NRe 4 hours 15 min BFR 400 DFR 800 EDW 90.5kg 2K/2Ca, AVF 15g Hep 3000 unit bolus Hectorol 2 mcg IV q HD  Assessment/Plan: 1. 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 Sharol Given, planned for tibial talar calcaneal fusion on Wednesday.  2.  ESRD:  On MWF schedule, off schedule today due to high census. On O2 at present but denies any SOB, CXR clear. Next HD likely Wednesday but can plan around surgery. Please renally dose medications.  3.  Hypertension/volume: BP controlled, trace edema but hx of chronic lymphedema. Net UF will be about 1.5L today.  4.  Anemia: Hgb 9.9. Will plan to start aranesp next HD, anticipate post op decrease in hemoglobin.  5.  Metabolic bone disease: Corrected calcium at goal. Continue hectorol. Continue calcium acetate and follow phos level.  6.  Nutrition:  Renal/carb modified diet.  7. T2DM: On insulin, per primary team  Anice Paganini, PA-C 06/16/2020, 8:08 AM  [facility] Pager: [phone]

## 2020-06-16 NOTE — Progress Notes (Signed)
PROGRESS NOTE  Jean Mcclain BSW:[id number] DOB: July 15, 1946 DOA: 06/14/2020 PCP: Leanna Battles, MD   LOS: 2 days   Brief Narrative / Interim history: 74 year old female with history of prior DVT on Eliquis, ESRD on HD MWF, IDDM, morbid obesity with a BMI of 40, OSA not on CPAP, hypertension, hyperlipidemia, GERD, nightly oxygen at home came into the hospital with worsening left ankle and foot pain with decreased mobility. This has been going on for a while, does not really recall an injury to the foot. Imaging of the left foot in the ED showed transverse mildly displaced fracture to the medial malleolus, subtalar dislocation with lateral angulation of the majority of the foot, lateral subluxation of the navicular at the talonavicular joint. Orthopedic surgery was consulted and she was admitted to the hospital  Subjective / 24h Interval events: Seen in dialysis, no chest pain, no shortness of breath.  Assessment & Plan: Principal Problem Left ankle fracture with significant deformity-orthopedic surgery consulted, I have discussed with Dr. Sharol Given who will take her to the OR on Wednesday for fusion.  N.p.o. after midnight, monitor postop, PT.  Active Problems History of DVT-hold Eliquis in the setting of surgery  End-stage renal disease on hemodialysis-nephrology consulted, HD today  IDDM -start home NPH at a lower dose, placed on sliding scale  CBG (last 3)  Recent Labs    06/15/20 2018 06/15/20 2257 06/16/20 0900  GLUCAP 125* 266* 104*   Myoclonic jerks - following with Neurology, on Carbodipa - Levodopa   RLS - on requip  She is also on prednisone however patient is not clear as to why.  CBG (last 3)  Recent Labs    06/15/20 2018 06/15/20 2257 06/16/20 0900  GLUCAP 125* 266* 104*   Scheduled Meds: . calcium acetate  667 mg Oral TID with meals  . carbidopa-levodopa  1 tablet Oral BID  . Chlorhexidine Gluconate Cloth  6 each Topical Q0600  . Chlorhexidine Gluconate  Cloth  6 each Topical Q0600  . colchicine  0.6 mg Oral Daily  . [START ON 06/17/2020] darbepoetin (ARANESP) injection - DIALYSIS  40 mcg Intravenous Q Wed-HD  . febuxostat  40 mg Oral Daily  . gabapentin  300 mg Oral BID  . heparin sodium (porcine)      . insulin aspart  0-9 Units Subcutaneous TID WC  . insulin NPH Human  20 Units Subcutaneous BID AC & HS  . multivitamin  1 tablet Oral Daily  . pantoprazole  40 mg Oral Daily  . predniSONE  20 mg Oral Daily  . rOPINIRole  4 mg Oral BID  . Zinc Oxide   Topical QID   Continuous Infusions: PRN Meds:.acetaminophen **OR** acetaminophen, calcium acetate, levalbuterol, morphine injection  Diet Orders (From admission, onward)    Start     Ordered   06/15/20 0832  Diet Carb Modified Fluid consistency: Thin; Room service appropriate? Yes  Diet effective now       Question Answer Comment  Diet-HS Snack? Nothing   Calorie Level Medium 1600-2000   Fluid consistency: Thin   Room service appropriate? Yes      06/15/20 0831          DVT prophylaxis:      Code Status: Full Code  Family Communication: d/w patient   Status is: Inpatient  Remains inpatient appropriate because:Inpatient level of care appropriate due to severity of illness  Dispo: The patient is from: Home  Anticipated d/c is to: SNF              Anticipated d/c date is: 3 days              Patient currently is not medically stable to d/c.  Consultants:  Orthopedic surgery   Procedures:  None   Microbiology  None   Antimicrobials: None     Objective: Vitals:   06/16/20 0700 06/16/20 0730 06/16/20 0800 06/16/20 0947  BP: (!) 121/53 (!) 111/46 (!) 107/45 (!) 102/52  Pulse: 78 75 69 81  Resp:    18  Temp:    97.8 F (36.6 C)  TempSrc:    Oral  SpO2:    95%  Weight:      Height:        Intake/Output Summary (Last 24 hours) at 06/16/2020 0951 Last data filed at 06/15/2020 2200 Gross per 24 hour  Intake 490 ml  Output 551 ml  Net -61 ml    Filed Weights   06/14/20 1706 06/14/20 2209 06/16/20 0354  Weight: 99.8 kg 93.6 kg 93.6 kg    Examination:  Constitutional: No distress Eyes: n no scleral icterus ENMT: Mucous membranes are moist.  Neck: normal, supple Respiratory: Clear bilaterally, no wheezing, no crackles Cardiovascular: Regular rate and rhythm, no murmurs, no peripheral edema Abdomen: Soft, nontender, nondistended, positive bowel sounds Skin: no rashes Neurologic: No focal deficits   Data Reviewed: I have independently reviewed following labs and imaging studies   CBC: Recent Labs  Lab 06/14/20 1634 06/16/20 0212  WBC 10.4 10.0  NEUTROABS 9.0*  --   HGB 11.1* 9.9*  HCT 37.3 33.0*  MCV 101.6* 101.2*  PLT 239 161   Basic Metabolic Panel: Recent Labs  Lab 06/14/20 1634 06/16/20 0212  NA 143 140  K 4.8 4.3  CL 99 99  CO2 28 29  GLUCOSE 132* 204*  BUN 71* 81*  CREATININE 4.81* 5.06*  CALCIUM 8.4* 8.2*   Liver Function Tests: Recent Labs  Lab 06/14/20 1634  AST 26  ALT 25  ALKPHOS 91  BILITOT 0.9  PROT 5.6*  ALBUMIN 2.9*   Coagulation Profile: No results for input(s): INR, PROTIME in the last 168 hours. HbA1C: No results for input(s): HGBA1C in the last 72 hours. CBG: Recent Labs  Lab 06/15/20 1136 06/15/20 1622 06/15/20 2018 06/15/20 2257 06/16/20 0900  GLUCAP 226* 148* 125* 266* 104*    Recent Results (from the past 240 hour(s))  SARS Coronavirus 2 by RT PCR (hospital order, performed in [facility] hospital lab) Nasopharyngeal Nasopharyngeal Swab     Status: None   Collection Time: 06/14/20  4:35 PM   Specimen: Nasopharyngeal Swab  Result Value Ref Range Status   SARS Coronavirus 2 NEGATIVE NEGATIVE Final    Comment: (NOTE) SARS-CoV-2 target nucleic acids are NOT DETECTED.  The SARS-CoV-2 RNA is generally detectable in upper and lower respiratory specimens during the acute phase of infection. The lowest concentration of SARS-CoV-2 viral copies this assay can  detect is 250 copies / mL. A negative result does not preclude SARS-CoV-2 infection and should not be used as the sole basis for treatment or other patient management decisions.  A negative result may occur with improper specimen collection / handling, submission of specimen other than nasopharyngeal swab, presence of viral mutation(s) within the areas targeted by this assay, and inadequate number of viral copies (<250 copies / mL). A negative result must be combined with clinical observations, patient history, and epidemiological  information.  Fact Sheet for Patients:   StrictlyIdeas.no  Fact Sheet for Healthcare Providers: BankingDealers.co.za  This test is not yet approved or  cleared by the [place] FDA and has been authorized for detection and/or diagnosis of SARS-CoV-2 by FDA under an Emergency Use Authorization (EUA).  This EUA will remain in effect (meaning this test can be used) for the duration of the COVID-19 declaration under Section 564(b)(1) of the Act, 21 U.S.C. section 360bbb-3(b)(1), unless the authorization is terminated or revoked sooner.  Performed at [facility], [place] [address], [place], [place] [address]   Surgical pcr screen     Status: None   Collection Time: 06/14/20 10:50 PM   Specimen: Nasal Mucosa; Nasal Swab  Result Value Ref Range Status   MRSA, PCR NEGATIVE NEGATIVE Final   Staphylococcus aureus NEGATIVE NEGATIVE Final    Comment: (NOTE) The Xpert SA Assay (FDA approved for NASAL specimens in patients 63 years of age and older), is one component of a comprehensive surveillance program. It is not intended to diagnose infection nor to guide or monitor treatment. Performed at [facility], [place] [address] [place], [place] [address]      Radiology Studies: No results found.  Marzetta Board, MD, PhD [facility]  Between 7 am - 7 pm I am available, please  contact me via Amion or Securechat  Between 7 pm - 7 am I am not available, please contact night coverage MD/APP via Amion

## 2020-06-16 NOTE — Patient Outreach (Signed)
  [facility]) Care Management Chronic Special Needs Program    06/16/2020  Name: Jean Mcclain, DOB: 09/03/1946  MRN: [id number]   Ms. [name] is enrolled in a chronic special needs plan for Diabetes. Client was admitted to [facility] on 06/14/20 with dx of closed fracture of Lt ankle. Individualized care plan sent to [facility] for admission  [facility] will continue to follow post discharge. Client is active with [facility] and communicated to [facility] of client's admission. [facility] with follow post discharge for transition of care.  Peter Garter RN, Jackquline Denmark, CDE Chronic Care Management Coordinator [facility] Care Management [phone]

## 2020-06-17 ENCOUNTER — Inpatient Hospital Stay (HOSPITAL_COMMUNITY): Payer: HMO | Admitting: Anesthesiology

## 2020-06-17 ENCOUNTER — Encounter (HOSPITAL_COMMUNITY): Payer: Self-pay | Admitting: Internal Medicine

## 2020-06-17 ENCOUNTER — Encounter (HOSPITAL_COMMUNITY)
Admission: EM | Disposition: A | Discharge status: Skilled Nursing Facility | Payer: Self-pay | Source: Home / Self Care | Attending: Internal Medicine

## 2020-06-17 ENCOUNTER — Inpatient Hospital Stay (HOSPITAL_COMMUNITY): Payer: HMO

## 2020-06-17 DIAGNOSIS — J9602 Acute respiratory failure with hypercapnia: Secondary | ICD-10-CM

## 2020-06-17 LAB — POCT I-STAT, CHEM 8
BUN: 42 mg/dL — ABNORMAL HIGH (ref 8–23)
Calcium, Ion: 1.13 mmol/L — ABNORMAL LOW (ref 1.15–1.40)
Chloride: 98 mmol/L (ref 98–111)
Creatinine, Ser: 3.7 mg/dL — ABNORMAL HIGH (ref 0.44–1.00)
Glucose, Bld: 107 mg/dL — ABNORMAL HIGH (ref 70–99)
HCT: 34 % — ABNORMAL LOW (ref 36.0–46.0)
Hemoglobin: 11.6 g/dL — ABNORMAL LOW (ref 12.0–15.0)
Potassium: 4.7 mmol/L (ref 3.5–5.1)
Sodium: 136 mmol/L (ref 135–145)
TCO2: 32 mmol/L (ref 22–32)

## 2020-06-17 LAB — CBC
HCT: 34.3 % — ABNORMAL LOW (ref 36.0–46.0)
Hemoglobin: 9.9 g/dL — ABNORMAL LOW (ref 12.0–15.0)
MCH: 30 pg (ref 26.0–34.0)
MCHC: 28.9 g/dL — ABNORMAL LOW (ref 30.0–36.0)
MCV: 103.9 fL — ABNORMAL HIGH (ref 80.0–100.0)
Platelets: 204 10*3/uL (ref 150–400)
RBC: 3.3 MIL/uL — ABNORMAL LOW (ref 3.87–5.11)
RDW: 15 % (ref 11.5–15.5)
WBC: 11.2 10*3/uL — ABNORMAL HIGH (ref 4.0–10.5)
nRBC: 0 % (ref 0.0–0.2)

## 2020-06-17 LAB — BLOOD GAS, ARTERIAL
Acid-Base Excess: 5.8 mmol/L — ABNORMAL HIGH (ref 0.0–2.0)
Acid-Base Excess: 5.8 mmol/L — ABNORMAL HIGH (ref 0.0–2.0)
Acid-Base Excess: 6.2 mmol/L — ABNORMAL HIGH (ref 0.0–2.0)
Bicarbonate: 32.9 mmol/L — ABNORMAL HIGH (ref 20.0–28.0)
Bicarbonate: 32.6 mmol/L — ABNORMAL HIGH (ref 20.0–28.0)
Bicarbonate: 33.3 mmol/L — ABNORMAL HIGH (ref 20.0–28.0)
Drawn by: 30136
FIO2: 28
FIO2: 40
FIO2: 40
O2 Saturation: 85.5 %
O2 Saturation: 93 %
O2 Saturation: 94.7 %
Patient temperature: 37
Patient temperature: 37.1
Patient temperature: 37
pCO2 arterial: 80.8 mmHg (ref 32.0–48.0)
pCO2 arterial: 76.5 mmHg (ref 32.0–48.0)
pCO2 arterial: 81.6 mmHg (ref 32.0–48.0)
pH, Arterial: 7.234 — ABNORMAL LOW (ref 7.350–7.450)
pH, Arterial: 7.254 — ABNORMAL LOW (ref 7.350–7.450)
pH, Arterial: 7.235 — ABNORMAL LOW (ref 7.350–7.450)
pO2, Arterial: 53.9 mmHg — ABNORMAL LOW (ref 83.0–108.0)
pO2, Arterial: 73.7 mmHg — ABNORMAL LOW (ref 83.0–108.0)
pO2, Arterial: 77.5 mmHg — ABNORMAL LOW (ref 83.0–108.0)

## 2020-06-17 LAB — GLUCOSE, CAPILLARY
Glucose-Capillary: 126 mg/dL — ABNORMAL HIGH (ref 70–99)
Glucose-Capillary: 116 mg/dL — ABNORMAL HIGH (ref 70–99)
Glucose-Capillary: 112 mg/dL — ABNORMAL HIGH (ref 70–99)
Glucose-Capillary: 105 mg/dL — ABNORMAL HIGH (ref 70–99)
Glucose-Capillary: 98 mg/dL (ref 70–99)
Glucose-Capillary: 97 mg/dL (ref 70–99)
Glucose-Capillary: 79 mg/dL (ref 70–99)

## 2020-06-17 LAB — BASIC METABOLIC PANEL
Anion gap: 9 (ref 5–15)
BUN: 39 mg/dL — ABNORMAL HIGH (ref 8–23)
CO2: 31 mmol/L (ref 22–32)
Calcium: 8.7 mg/dL — ABNORMAL LOW (ref 8.9–10.3)
Chloride: 98 mmol/L (ref 98–111)
Creatinine, Ser: 4.01 mg/dL — ABNORMAL HIGH (ref 0.44–1.00)
GFR calc Af Amer: 12 mL/min — ABNORMAL LOW (ref >60)
GFR calc non Af Amer: 10 mL/min — ABNORMAL LOW (ref >60)
Glucose, Bld: 109 mg/dL — ABNORMAL HIGH (ref 70–99)
Potassium: 4.8 mmol/L (ref 3.5–5.1)
Sodium: 138 mmol/L (ref 135–145)

## 2020-06-17 LAB — HEPATITIS B SURFACE ANTIBODY, QUANTITATIVE: Hep B S AB Quant (Post): <3.1 m[IU]/mL — ABNORMAL LOW (ref >9.9)

## 2020-06-17 SURGERY — ANKLE FUSION
Anesthesia: Choice | Site: Ankle | Laterality: Left

## 2020-06-17 MED ORDER — CHLORHEXIDINE GLUCONATE 0.12 % MT SOLN
OROMUCOSAL | Status: AC
  Administered 2020-06-17: 15 mL via OROMUCOSAL
  Filled 2020-06-17: qty 15

## 2020-06-17 MED ORDER — BUPIVACAINE LIPOSOME 1.3 % IJ SUSP
INTRAMUSCULAR | Status: DC | PRN
  Administered 2020-06-17: 10 mL via PERINEURAL

## 2020-06-17 MED ORDER — NALOXONE HCL 0.4 MG/ML IJ SOLN
0.4000 mg | Freq: Once | INTRAMUSCULAR | Status: AC
  Administered 2020-06-17: 0.4 mg via INTRAVENOUS
  Filled 2020-06-17: qty 1

## 2020-06-17 MED ORDER — BUPIVACAINE HCL (PF) 0.25 % IJ SOLN
INTRAMUSCULAR | Status: DC | PRN
  Administered 2020-06-17 (×2): 15 mL

## 2020-06-17 MED ORDER — MORPHINE SULFATE (PF) 2 MG/ML IV SOLN: 1.0000 mg | Freq: Four times a day (QID) | INTRAVENOUS | Status: DC | PRN

## 2020-06-17 MED ORDER — ORAL CARE MOUTH RINSE: 15.0000 mL | Freq: Once | OROMUCOSAL | Status: AC

## 2020-06-17 MED ORDER — INSULIN ASPART 100 UNIT/ML ~~LOC~~ SOLN
0.0000 [IU] | Freq: Three times a day (TID) | SUBCUTANEOUS | Status: DC
  Administered 2020-06-19 – 2020-06-20 (×3): 2 [IU] via SUBCUTANEOUS
  Administered 2020-06-21: 1 [IU] via SUBCUTANEOUS
  Administered 2020-06-21: 2 [IU] via SUBCUTANEOUS
  Administered 2020-06-22: 3 [IU] via SUBCUTANEOUS
  Administered 2020-06-23 (×2): 2 [IU] via SUBCUTANEOUS
  Administered 2020-06-23: 1 [IU] via SUBCUTANEOUS
  Administered 2020-06-24: 5 [IU] via SUBCUTANEOUS
  Administered 2020-06-24 – 2020-06-25 (×2): 2 [IU] via SUBCUTANEOUS
  Administered 2020-06-25: 1 [IU] via SUBCUTANEOUS

## 2020-06-17 MED ORDER — INSULIN NPH (HUMAN) (ISOPHANE) 100 UNIT/ML ~~LOC~~ SUSP
10.0000 [IU] | Freq: Two times a day (BID) | SUBCUTANEOUS | Status: DC
  Filled 2020-06-17: qty 10

## 2020-06-17 MED ORDER — APIXABAN 2.5 MG PO TABS
2.5000 mg | ORAL_TABLET | Freq: Two times a day (BID) | ORAL | Status: DC
  Administered 2020-06-18 – 2020-06-22 (×10): 2.5 mg via ORAL
  Filled 2020-06-17 (×10): qty 1

## 2020-06-17 MED ORDER — CEFAZOLIN SODIUM-DEXTROSE 2-4 GM/100ML-% IV SOLN
2.0000 g | INTRAVENOUS | Status: DC
  Filled 2020-06-17: qty 100

## 2020-06-17 MED ORDER — SODIUM CHLORIDE 0.9 % IV SOLN: INTRAVENOUS | Status: DC

## 2020-06-17 MED ORDER — GABAPENTIN 300 MG PO CAPS
300.0000 mg | ORAL_CAPSULE | Freq: Every day | ORAL | Status: DC
  Administered 2020-06-18 – 2020-06-20 (×3): 300 mg via ORAL
  Filled 2020-06-17 (×3): qty 1

## 2020-06-17 MED ORDER — CHLORHEXIDINE GLUCONATE 0.12 % MT SOLN: 15.0000 mL | Freq: Once | OROMUCOSAL | Status: AC

## 2020-06-17 NOTE — Progress Notes (Addendum)
Patient hard  to arouse. Responds to name but falls right  back to sleep easily. Able to answer simple questions and follows simple commands. Patient has a change in condition and MD is aware. VS stable and on 2lpm via nasal cannula. Order received for ABG's.   1150:Patient back from Short stay, reported that patient surgery has been postponed. Patient status remains unchanged. ABG results received and critical values reported to MD. Order received to transfer patient. Rapid response and respiratory therapy informed.   2:00 Report given to [facility]. Patient transffering to 3E06.

## 2020-06-17 NOTE — Anesthesia Procedure Notes (Signed)
Anesthesia Regional Block: Popliteal block   Pre-Anesthetic Checklist: ,, timeout performed, Correct Patient, Correct Site, Correct Laterality, Correct Procedure, Correct Position, site marked, Risks and benefits discussed,  Surgical consent,  Pre-op evaluation,  At surgeon's request and post-op pain management  Laterality: Left  Prep: chloraprep       Needles:  Injection technique: Single-shot  Needle Type: Echogenic Stimulator Needle          Additional Needles:   Narrative:  Start time: 06/17/2020 10:53 AM End time: 06/17/2020 11:03 AM Injection made incrementally with aspirations every 5 mL.  Performed by: Personally  Anesthesiologist: Duane Boston, MD  Additional Notes: A functioning IV was confirmed and monitors were applied.  Sterile prep and drape, hand hygiene and sterile gloves were used.  Negative aspiration and test dose prior to incremental administration of local anesthetic. The patient tolerated the procedure well.Ultrasound  guidance: relevant anatomy identified, needle position confirmed, local anesthetic spread visualized around nerve(s), vascular puncture avoided.  Image printed for medical record. ACB supplement.

## 2020-06-17 NOTE — Progress Notes (Signed)
RT transported patient from 5N10 to 3E06 on bipap with rapid response RN. No complications.

## 2020-06-17 NOTE — Progress Notes (Signed)
Patient ID: Jean Mcclain, female   DOB: Dec 29, 1945, 74 y.o.   MRN: [id number] Patient had an acute change in mental status this morning she did not respond to questions she had grunting breathing.  Arterial blood gas showed elevated CO2.  Patient's surgery was canceled.  Anticipate patient will need discharge to skilled nursing.  Orders are placed for a fracture boot weightbearing as tolerated on the left lower extremity.  We will plan to follow-up in the office after discharge to arrange for surgical intervention.  If patient is still hospitalized and stable next Wednesday we could proceed with surgery next Wednesday.

## 2020-06-17 NOTE — Progress Notes (Signed)
Orthopedic Tech Progress Note Patient Details:  Jean Mcclain 09/11/1946 [id number] Left CAM walker at bedside for patient. Patient ID: Jean Mcclain, female   DOB: 1946/07/06, 74 y.o.   MRN: [id number]   Jean Mcclain 06/17/2020, 6:38 PM

## 2020-06-17 NOTE — Plan of Care (Signed)

## 2020-06-17 NOTE — Progress Notes (Addendum)
Progress Note    Jean Mcclain  ZOX:[id number] DOB: 09-05-1946  DOA: 06/14/2020 PCP: Leanna Battles, MD    Brief Narrative:    Medical records reviewed and are as summarized below: 74 year old female with history of prior DVT on Eliquis, ESRD on HD MWF, IDDM, morbid obesity with a BMI of 40, OSA not on CPAP, hypertension, hyperlipidemia, GERD, nightly oxygen (2L) came into the hospital with worsening left ankle and foot pain with decreased mobility. This has been going on for a while, does not really recall an injury to the foot. Imaging of the left foot in the ED showed transverse mildly displaced fracture to the medial malleolus, subtalar dislocation with lateral angulation of the majority of the foot, lateral subluxation of the navicular at the talonavicular joint. Orthopedic surgery was consulted and she was admitted to the hospital.  Plan for surgery on 9/8 but patient was confused and lethargic at times   Assessment/Plan:   Principal Problem:   Ankle fracture Active Problems:   Insulin-requiring or dependent type II diabetes mellitus ([place])   Essential hypertension   End stage renal disease (HCC)   HLD (hyperlipidemia)   Primary osteoarthritis, left ankle and foot   Left ankle fracture with deformity -orthopedics consulted -plan for surgery on 9/8  Acute on Chronic respiratory failure with hypercapnia due to  OSA -does not tolerate CPAP QHS -on 2L [place] at night -DOES NOT NEED TO BE 100% -check ABG to r/o CO2 retention  ADDENDUM: ABG Shows retention, will place BIPAP And tx to SDU  ADDENDUM 4:19PM: repeat ABG not improved, patient will still open eyes but falls back to sleep.  Will ask PCCM to evaluate Narcan given with little to no response (last morphine was 9/6) Xray w/o fluid -nebs ordered  Type 2 DM -SSI -resume home NPH at lower dose and adjust as needed  ESRD -on HD -nephrology consult  Chronic disease -Transfuse for hemoglobin less 7  Pressure  ulcer- POA Pressure Injury 06/14/20 Buttocks Right;Posterior Stage 2 -  Partial thickness loss of dermis presenting as a shallow open injury with a red, pink wound bed without slough. (Active)  06/14/20 2200  Location: Buttocks  Location Orientation: Right;Posterior  Staging: Stage 2 -  Partial thickness loss of dermis presenting as a shallow open injury with a red, pink wound bed without slough.  Wound Description (Comments):   Present on Admission: Yes  -WOC consult: Using no rinse spray, cleanse buttocks and bilateral posterior upper thighs. Apply triple paste. I have also entered an order for mattress with low air loss feature.   H/o DVT -resume eliquis when ok with orthopedics  RLS -on requip  Morbid obesity Body mass index is 39.87 kg/m.   From chart review it appears patient's prednisone was stopped in April   Family Communication/Anticipated D/C date and plan/Code Status   DVT prophylaxis: restart eliquis after surgery Code Status: Full Code.  Family Communication: patient Disposition Plan: Status is: Inpatient  Remains inpatient appropriate because:Inpatient level of care appropriate due to severity of illness   Dispo: The patient is from: ILF?               Anticipated d/c is to: SNF              Anticipated d/c date is: 1 day              Patient currently is not medically stable to d/c.         Medical  Consultants:    Orthopedics     Subjective:   Very sleepy this morning and difficult to wake up but would and knew she was to have surgery today  Objective:    Vitals:   06/16/20 1446 06/16/20 2005 06/17/20 0532 06/17/20 0809  BP: 133/63 132/60 (!) 137/59 134/62  Pulse: 75 84 75 87  Resp: 17 18    Temp: 98.8 F (37.1 C) 98.7 F (37.1 C) 98.1 F (36.7 C) 98.4 F (36.9 C)  TempSrc: Oral Oral Oral Oral  SpO2: 96% 94% 100% 93%  Weight:      Height:        Intake/Output Summary (Last 24 hours) at 06/17/2020 0909 Last data filed at  06/17/2020 0557 Gross per 24 hour  Intake --  Output 1200 ml  Net -1200 ml   Filed Weights   06/14/20 2209 06/16/20 0354 06/16/20 0809  Weight: 93.6 kg 93.6 kg 92.6 kg    Exam:  General: Appearance:    Obese female , sleepy, wakes up slowly     Lungs:     Diminished due to size, respirations unlabored  Heart:    Normal heart rate. Normal rhythm. No murmurs, rubs, or gallops.   MS:   All extremities are intact.   Neurologic:  Sleepy but will awaken and answer questions appropriately    Data Reviewed:   I have personally reviewed following labs and imaging studies:  Labs: Labs show the following:   Basic Metabolic Panel: Recent Labs  Lab 06/14/20 1634 06/16/20 0212  NA 143 140  K 4.8 4.3  CL 99 99  CO2 28 29  GLUCOSE 132* 204*  BUN 71* 81*  CREATININE 4.81* 5.06*  CALCIUM 8.4* 8.2*   GFR Estimated Creatinine Clearance: 9.9 mL/min (A) (by C-G formula based on SCr of 5.06 mg/dL (H)). Liver Function Tests: Recent Labs  Lab 06/14/20 1634  AST 26  ALT 25  ALKPHOS 91  BILITOT 0.9  PROT 5.6*  ALBUMIN 2.9*   No results for input(s): LIPASE, AMYLASE in the last 168 hours. No results for input(s): AMMONIA in the last 168 hours. Coagulation profile No results for input(s): INR, PROTIME in the last 168 hours.  CBC: Recent Labs  Lab 06/14/20 1634 06/16/20 0212  WBC 10.4 10.0  NEUTROABS 9.0*  --   HGB 11.1* 9.9*  HCT 37.3 33.0*  MCV 101.6* 101.2*  PLT 239 215   Cardiac Enzymes: No results for input(s): CKTOTAL, CKMB, CKMBINDEX, TROPONINI in the last 168 hours. BNP (last 3 results) No results for input(s): PROBNP in the last 8760 hours. CBG: Recent Labs  Lab 06/16/20 1157 06/16/20 1612 06/16/20 2041 06/17/20 0638 06/17/20 0808  GLUCAP 129* 191* 172* 126* 116*   D-Dimer: No results for input(s): DDIMER in the last 72 hours. Hgb A1c: No results for input(s): HGBA1C in the last 72 hours. Lipid Profile: No results for input(s): CHOL, HDL, LDLCALC,  TRIG, CHOLHDL, LDLDIRECT in the last 72 hours. Thyroid function studies: No results for input(s): TSH, T4TOTAL, T3FREE, THYROIDAB in the last 72 hours.  Invalid input(s): FREET3 Anemia work up: No results for input(s): VITAMINB12, FOLATE, FERRITIN, TIBC, IRON, RETICCTPCT in the last 72 hours. Sepsis Labs: Recent Labs  Lab 06/14/20 1634 06/16/20 0212  WBC 10.4 10.0    Microbiology Recent Results (from the past 240 hour(s))  SARS Coronavirus 2 by RT PCR (hospital order, performed in [facility] hospital lab) Nasopharyngeal Nasopharyngeal Swab     Status: None   Collection  Time: 06/14/20  4:35 PM   Specimen: Nasopharyngeal Swab  Result Value Ref Range Status   SARS Coronavirus 2 NEGATIVE NEGATIVE Final    Comment: (NOTE) SARS-CoV-2 target nucleic acids are NOT DETECTED.  The SARS-CoV-2 RNA is generally detectable in upper and lower respiratory specimens during the acute phase of infection. The lowest concentration of SARS-CoV-2 viral copies this assay can detect is 250 copies / mL. A negative result does not preclude SARS-CoV-2 infection and should not be used as the sole basis for treatment or other patient management decisions.  A negative result may occur with improper specimen collection / handling, submission of specimen other than nasopharyngeal swab, presence of viral mutation(s) within the areas targeted by this assay, and inadequate number of viral copies (<250 copies / mL). A negative result must be combined with clinical observations, patient history, and epidemiological information.  Fact Sheet for Patients:   StrictlyIdeas.no  Fact Sheet for Healthcare Providers: BankingDealers.co.za  This test is not yet approved or  cleared by the [place] FDA and has been authorized for detection and/or diagnosis of SARS-CoV-2 by FDA under an Emergency Use Authorization (EUA).  This EUA will remain in effect (meaning this  test can be used) for the duration of the COVID-19 declaration under Section 564(b)(1) of the Act, 21 U.S.C. section 360bbb-3(b)(1), unless the authorization is terminated or revoked sooner.  Performed at [facility], [place] [address] [place], [place] [address]   Surgical pcr screen     Status: None   Collection Time: 06/14/20 10:50 PM   Specimen: Nasal Mucosa; Nasal Swab  Result Value Ref Range Status   MRSA, PCR NEGATIVE NEGATIVE Final   Staphylococcus aureus NEGATIVE NEGATIVE Final    Comment: (NOTE) The Xpert SA Assay (FDA approved for NASAL specimens in patients 10 years of age and older), is one component of a comprehensive surveillance program. It is not intended to diagnose infection nor to guide or monitor treatment. Performed at [facility], [place] [address], [place], [place] [address]     Procedures and diagnostic studies:  No results found.  Medications:   . calcium acetate  667 mg Oral TID with meals  . carbidopa-levodopa  1 tablet Oral BID  . Chlorhexidine Gluconate Cloth  6 each Topical Q0600  . Chlorhexidine Gluconate Cloth  6 each Topical Q0600  . colchicine  0.6 mg Oral Daily  . darbepoetin (ARANESP) injection - DIALYSIS  40 mcg Intravenous Q Wed-HD  . febuxostat  40 mg Oral Daily  . gabapentin  300 mg Oral BID  . insulin aspart  0-9 Units Subcutaneous TID WC  . insulin NPH Human  20 Units Subcutaneous BID AC & HS  . multivitamin  1 tablet Oral Daily  . pantoprazole  40 mg Oral Daily  . polyethylene glycol  17 g Oral Daily  . predniSONE  20 mg Oral Daily  . rOPINIRole  4 mg Oral BID  . Zinc Oxide   Topical QID   Continuous Infusions:   LOS: 3 days   Geradine Girt  [facility]   How to contact the [facility] Attending or Consulting provider [place] or covering provider during after hours [place], for this patient?  1. Check the care team in [facility] and look for a) attending/consulting TR[facility] provider listed and b) the [facility] team  listed 2. Log into [email or web address] and use [place]'s universal password to access. If you do not have the password, please contact  the hospital operator. 3. Locate the [facility] provider you are looking for under [facility] and page to a number that you can be directly reached. 4. If you still have difficulty reaching the provider, please page the [facility] (Director on Call) for the Hospitalists listed on amion for assistance.  06/17/2020, 9:09 AM

## 2020-06-17 NOTE — Anesthesia Preprocedure Evaluation (Addendum)
Anesthesia Evaluation    Airway        Dental   Pulmonary former smoker,           Cardiovascular hypertension,      Neuro/Psych    GI/Hepatic   Endo/Other  diabetes  Renal/GU      Musculoskeletal   Abdominal   Peds  Hematology   Anesthesia Other Findings   Reproductive/Obstetrics                             Anesthesia Physical Anesthesia Plan  ASA:   Anesthesia Plan:    Post-op Pain Management:    Induction:   PONV Risk Score and Plan:   Airway Management Planned:   Additional Equipment:   Intra-op Plan:   Post-operative Plan:   Informed Consent:   Plan Discussed with:   Anesthesia Plan Comments: (Pt seems confused.  Able to give name and DOB and surgery.  Discussed with Dr. Sharol Given who feels pt has changed, surgery cancelled.)        Anesthesia Quick Evaluation

## 2020-06-17 NOTE — Progress Notes (Signed)
ABG results from 1815 are not crossing over to Epic.  MD has been notified of critical results.  PH-7.25 PO2-73.7 PCO2-76.5 HCO3-32.6

## 2020-06-17 NOTE — Consult Note (Signed)
NAME:  Jean Mcclain, MRN:  [id number], DOB:  July 18, 1946, LOS: 3 ADMISSION DATE:  06/14/2020, CONSULTATION DATE: 06/17/2020 REFERRING MD: Dr. Lucianne Lei, CHIEF COMPLAINT: Altered mental status  Brief History   74 year old female, morbidly obese, found to have elevated PCO2, acute hypercarbic respiratory failure placed on BiPAP.  PCO2 greater than 80.  Pulmonary was consulted for recommendations and management.  History of present illness   74 year old gentleman past medical history of DVT on Eliquis, ESRD, on IHD Monday Wednesday Friday time insulin-dependent diabetes, morbid obesity BMI 40, OSA not on CPAP, hypertension, hyperlipidemia, gastroesophageal reflux disease and 2 L nightly O2.  Patient was found to have a transverse mildly displaced fracture of the medial malleolus and subtalar dislocation with lateral angulation and lateral subluxation of the navicular at the talonavicular joint.  Orthopedic surgery was consulted.  Hospitalist service admitted for medical management.  Patient was plan to go to the OR today however this was canceled.  Patient had an arterial blood gas which revealed acute hypercarbic respiratory failure.  Patient was placed on BiPAP.  The initial BiPAP settings appeared to have a respiratory rate of 8 with a minute ventilation of only 4.8 L upon presentation to the room.  Patient was arousable to voice.  When we stimulated her she would open her eyes Daneil Dan follows some basic commands.  We did make some ventilator adjustments after few moments patient started to respond a little bit more.  I think we should give her more time on BiPAP therapy.  This was discussed with RT on the floor.   Past Medical History   Past Medical History:  Diagnosis Date  . Anxiety   . Arthritis    osteo  . Chronic kidney disease   . Diabetes mellitus, type 2 (HCC)    Type II  . DVT (deep venous thrombosis) ([place]) 03/2017   left popliteal  . Facet joint disease   . Facet joint disease    foot   . Fibula fracture 02/2017   left  . GERD (gastroesophageal reflux disease)   . Hyperlipidemia   . Hypertension   . OSA (obstructive sleep apnea)    does not use CPAP   . Osteopenia   . Shortness of breath    with exertion  . Venous stasis ulcers ([place])   . Vitamin D deficiency      Significant Hospital Events     Consults:  PCCM ortho  Procedures:    Significant Diagnostic Tests:    Micro Data:  covid neg   Antimicrobials:  None    Interim history/subjective:  Per HPI   Objective   Blood pressure (!) 123/49, pulse 73, temperature 98.8 F (37.1 C), temperature source Oral, resp. rate (!) 22, height 5' (1.524 m), weight 92.6 kg, SpO2 99 %.    FiO2 (%):  [40 %] 40 %   Intake/Output Summary (Last 24 hours) at 06/17/2020 1711 Last data filed at 06/17/2020 0557 Gross per 24 hour  Intake --  Output 1200 ml  Net -1200 ml   Filed Weights   06/14/20 2209 06/16/20 0354 06/16/20 0809  Weight: 93.6 kg 93.6 kg 92.6 kg    Examination: General: Obese female on BiPAP, responsive to voice HENT: Large neck, NCAT, tracking appropriately Lungs: Bilateral ventilated breath sounds on NIPPV Cardiovascular: Regular rhythm, S1-S2 Abdomen: Obese pannus soft nontender nondistended Extremities: No significant Neuro: Alert to voice.  Moves all 4 extremities when stimulated GU: Deferred  Resolved Hospital Problem list  Assessment & Plan:   Acute hypercarbic respiratory failure requiring NIPPV Morbid obesity Body mass index is 39.87 kg/m.  Likely obesity hypoventilation syndrome OSA not on CPAP at home Plan: Change BiPAP settings Pressure control ventilation IPAP 22 EPAP 5, respiratory rate 20 Attempt to maintain minute ventilation around 9 to 10 L Repeat arterial blood gas in 1 hour. Avoid respiratory depressing meds If not improving with changes on BiPAP settings may need to consider movement to stepdown unit or ICU.  Ankle fracture with  deformity Plan: Orthopedic surgery managed  ESRD on IHD Plan: HD per nephrology    Labs   CBC: Recent Labs  Lab 06/14/20 1634 06/16/20 0212 06/17/20 1158 06/17/20 1528  WBC 10.4 10.0  --  11.2*  NEUTROABS 9.0*  --   --   --   HGB 11.1* 9.9* 11.6* 9.9*  HCT 37.3 33.0* 34.0* 34.3*  MCV 101.6* 101.2*  --  103.9*  PLT 239 215  --  478    Basic Metabolic Panel: Recent Labs  Lab 06/14/20 1634 06/16/20 0212 06/17/20 1158 06/17/20 1528  NA 143 140 136 138  K 4.8 4.3 4.7 4.8  CL 99 99 98 98  CO2 28 29  --  31  GLUCOSE 132* 204* 107* 109*  BUN 71* 81* 42* 39*  CREATININE 4.81* 5.06* 3.70* 4.01*  CALCIUM 8.4* 8.2*  --  8.7*   GFR: Estimated Creatinine Clearance: 12.5 mL/min (A) (by C-G formula based on SCr of 4.01 mg/dL (H)). Recent Labs  Lab 06/14/20 1634 06/16/20 0212 06/17/20 1528  WBC 10.4 10.0 11.2*    Liver Function Tests: Recent Labs  Lab 06/14/20 1634  AST 26  ALT 25  ALKPHOS 91  BILITOT 0.9  PROT 5.6*  ALBUMIN 2.9*   No results for input(s): LIPASE, AMYLASE in the last 168 hours. No results for input(s): AMMONIA in the last 168 hours.  ABG    Component Value Date/Time   PHART 7.235 (L) 06/17/2020 1550   PCO2ART 81.6 (HH) 06/17/2020 1550   PO2ART 77.5 (L) 06/17/2020 1550   HCO3 33.3 (H) 06/17/2020 1550   TCO2 32 06/17/2020 1158   ACIDBASEDEF 2.6 (H) 03/31/2017 0411   O2SAT 94.7 06/17/2020 1550     Coagulation Profile: No results for input(s): INR, PROTIME in the last 168 hours.  Cardiac Enzymes: No results for input(s): CKTOTAL, CKMB, CKMBINDEX, TROPONINI in the last 168 hours.  HbA1C: Hgb A1c MFr Bld  Date/Time Value Ref Range Status  01/22/2020 01:03 PM 6.3 (H) 4.8 - 5.6 % Final    Comment:    (NOTE) Pre diabetes:          5.7%-6.4% Diabetes:              >6.4% Glycemic control for   <7.0% adults with diabetes   03/11/2017 12:28 AM 7.1 (H) 4.8 - 5.6 % Final    Comment:    (NOTE)         Pre-diabetes: 5.7 - 6.4          Diabetes: >6.4         Glycemic control for adults with diabetes: <7.0     CBG: Recent Labs  Lab 06/17/20 0808 06/17/20 0946 06/17/20 1135 06/17/20 1213 06/17/20 1612  GLUCAP 116* 112* 98 105* 97    Review of Systems:   Unable to be obtained secondary to encephalopathy   Past Medical History  She,  has a past medical history of Anxiety, Arthritis, Chronic kidney disease, Diabetes mellitus,  type 2 ([place]), DVT (deep venous thrombosis) ([place]) (03/2017), Facet joint disease, Facet joint disease, Fibula fracture (02/2017), GERD (gastroesophageal reflux disease), Hyperlipidemia, Hypertension, OSA (obstructive sleep apnea), Osteopenia, Shortness of breath, Venous stasis ulcers ([place]), and Vitamin D deficiency.   Surgical History    Past Surgical History:  Procedure Laterality Date  . AV FISTULA PLACEMENT Right 08/20/2018   Procedure: ARTERIOVENOUS (AV) FISTULA CREATION BRACHIOCEPHALIC;  Surgeon: Angelia Mould, MD;  Location: [place];  Service: Vascular;  Laterality: Right;  . BREAST SURGERY Right 03/1997   [facility]  . CARPAL TUNNEL RELEASE Left 2016  . INSERTION OF MESH N/A 01/08/2013   Procedure: INSERTION OF MESH;  Surgeon: Earnstine Regal, MD;  Location: [facility];  Service: General;  Laterality: N/A;  . IR THORACENTESIS ASP PLEURAL SPACE W/IMG GUIDE  04/06/2017  . JOINT REPLACEMENT Right 1999  . TOTAL KNEE ARTHROPLASTY Left 1999   Left  . TRIGGER FINGER RELEASE Left 11/2014  . TRIGGER FINGER RELEASE Right   . VENTRAL HERNIA REPAIR N/A 01/08/2013   Procedure: LAPAROSCOPIC VENTRAL HERNIA;  Surgeon: Earnstine Regal, MD;  Location: [facility];  Service: General;  Laterality: N/A;     Social History   reports that she quit smoking about 38 years ago. Her smoking use included cigarettes. She quit after 27.00 years of use. She has never used smokeless tobacco. She reports that she does not drink alcohol and does not use drugs.   Family History   Her family history includes Emphysema in her  father; Hypertension in her brother; Leukemia in her mother.   Allergies No Known Allergies   Home Medications  Prior to Admission medications   Medication Sig Start Date End Date Taking? Authorizing Provider  acetaminophen (TYLENOL) 500 MG tablet Take 1,000-1,500 mg by mouth 3 (three) times daily as needed for moderate pain or headache.    Yes [provider]  albuterol (VENTOLIN HFA) 108 (90 Base) MCG/ACT inhaler Inhale 2 puffs into the lungs every 6 (six) hours as needed for wheezing or shortness of breath.  03/26/20  Yes [provider]  apixaban (ELIQUIS) 2.5 MG TABS tablet Take 2.5 mg by mouth 2 (two) times daily.   Yes [provider]  calcium acetate (PHOSLO) 667 MG tablet Take 667 mg by mouth See admin instructions. Take one tablet (667 mg) by mouth up to three times daily with meals and with a nutritional shake every night 11/28/19  Yes [provider]  carbidopa-levodopa (SINEMET IR) 10-100 MG tablet Take 1 tablet by mouth in the morning and at bedtime.  11/23/19  Yes [provider]  colchicine 0.6 MG tablet Take 0.6 mg by mouth daily.  02/21/17  Yes [provider]  febuxostat (ULORIC) 40 MG tablet Take 40 mg by mouth daily.   Yes [provider]  gabapentin (NEURONTIN) 300 MG capsule Take 300 mg by mouth 2 (two) times daily.  11/14/19  Yes [provider]  insulin NPH Human (NOVOLIN N) 100 UNIT/ML injection Inject 0.2 mLs (20 Units total) into the skin 2 (two) times daily at 8 am and 10 pm. Patient taking differently: Inject 30-50 Units into the skin See admin instructions. Inject 50 units subcutaneously every morning and 30 units at night 01/25/20  Yes [place] Eric J, DO  insulin regular (NOVOLIN R RELION) 100 units/mL injection Inject 10-20 Units into the skin See admin instructions. Inject 10-25 units subcutaneously 3-4 times daily per sliding scale on chart at home   Yes  [provider]  multivitamin  (RENA-VIT) TABS tablet Take 1 tablet by mouth daily. 10/09/19  Yes [provider]  omeprazole (PRILOSEC) 20 MG capsule Take 20 mg by mouth daily before breakfast.    Yes [provider]  OXYGEN Inhale 2 L into the lungs as needed (shortness of breath).   Yes [provider]  predniSONE (DELTASONE) 20 MG tablet Take 20 mg by mouth daily. 03/26/20  Yes [provider]  rOPINIRole (REQUIP) 4 MG tablet Take 4 mg by mouth 2 (two) times daily. 05/27/20  Yes [provider]  B-D INS SYR ULTRAFINE 1CC/30G 30G X 1/2" 1 ML MISC 1 each by Other route daily.  12/23/19   [provider]  Blood Glucose Monitoring Suppl ([place]) w/Device KIT  03/23/20   [provider]  doxercalciferol (HECTOROL) 4 MCG/2ML injection Inject 1 mL (2 mcg total) into the vein every Monday, Wednesday, and Friday with hemodialysis. 01/27/20   [place] Donnamarie Poag, DO  doxycycline (MONODOX) 100 MG capsule Take 100 mg by mouth 2 (two) times daily. 05/27/20   [provider]  glucose blood test strip 1 each by Other route daily.     [provider]  hydrOXYzine (ATARAX/VISTARIL) 10 MG tablet Take 1 tablet (10 mg total) by mouth 2 (two) times daily as needed. Patient not taking: Reported on 06/14/2020 11/20/19   Dohmeier, Asencion Partridge, MD  Lancets [facility] DELICA PLUS [id number]) [place] 1 each by Other route daily.  03/05/19   [provider]  metoprolol succinate (TOPROL-XL) 25 MG 24 hr tablet Take 25 mg by mouth daily. Patient not taking: Reported on 06/14/2020 03/26/20   [provider]  polyethylene glycol (MIRALAX / GLYCOLAX) 17 g packet Take 17 g by mouth daily as needed for mild constipation. Patient not taking: Reported on 06/14/2020 01/25/20   [place] Donnamarie Poag, DO  pravastatin (PRAVACHOL) 40 MG tablet Take 1 tablet (40 mg total) by mouth daily at 6 PM. Patient not taking: Reported on 06/14/2020 01/25/20   [place] Eric J, DO    This patient is  critically ill with multiple organ system failure; which, requires frequent high complexity decision making, assessment, support, evaluation, and titration of therapies. This was completed through the application of advanced monitoring technologies and extensive interpretation of multiple databases. During this encounter critical care time was devoted to patient care services described in this note for 32 minutes.  Garner Nash, DO [facility] 06/17/2020 5:11 PM

## 2020-06-17 NOTE — Progress Notes (Signed)
Narcan given with little response.

## 2020-06-17 NOTE — Progress Notes (Signed)
RT placed patient on bipap per MD order. Patient tolerating well at this time. RT at bedside with patient at this time.

## 2020-06-17 NOTE — Interval H&P Note (Signed)
History and Physical Interval Note:  06/17/2020 6:46 AM  Jean Mcclain  has presented today for surgery, with the diagnosis of Left Ankle Fracture and Subtalar Arthritis.  The various methods of treatment have been discussed with the patient and family. After consideration of risks, benefits and other options for treatment, the patient has consented to  Procedure(s): LEFT TIBIOCALCANEAL FUSION (Left) as a surgical intervention.  The patient's history has been reviewed, patient examined, no change in status, stable for surgery.  I have reviewed the patient's chart and labs.  Questions were answered to the patient's satisfaction.     [name]

## 2020-06-17 NOTE — Progress Notes (Signed)
[facility] Progress Note   Subjective: seen in room, looks like surgery was cancelled and Bipap now on progressive unit on BiPap.  CXR just done is clear. ABG just done shows pCO2 in the 80's.  Pt is lethargic, difficult to arouse.   Objective Vitals:   06/17/20 1050 06/17/20 1214 06/17/20 1331 06/17/20 1542  BP: (!) 131/45 (!) 126/45  (!) 123/49  Pulse: 87 84 80 82  Resp: (!) 25 14 16 13   Temp:  98.8 F (37.1 C)    TempSrc:  Oral    SpO2: 100% 93% 98% 100%  Weight:      Height:       Physical Exam General: obese WF, not responding, on bipap, moans Heart: RRR, no murmurs, rubs or gallops Lungs: CTA bilaterally without wheezing, rhonchi or rales Abdomen: Soft, non-tender, non-distended, +BS Extremities: Trace pitting edema bilateral lower extremities Dialysis Access: RUE AVF accessed  Additional Objective Labs: Basic Metabolic Panel: Recent Labs  Lab 06/14/20 1634 06/16/20 0212 06/17/20 1158  NA 143 140 136  K 4.8 4.3 4.7  CL 99 99 98  CO2 28 29  --   GLUCOSE 132* 204* 107*  BUN 71* 81* 42*  CREATININE 4.81* 5.06* 3.70*  CALCIUM 8.4* 8.2*  --    Liver Function Tests: Recent Labs  Lab 06/14/20 1634  AST 26  ALT 25  ALKPHOS 91  BILITOT 0.9  PROT 5.6*  ALBUMIN 2.9*   CBC: Recent Labs  Lab 06/14/20 1634 06/16/20 0212 06/17/20 1158  WBC 10.4 10.0  --   NEUTROABS 9.0*  --   --   HGB 11.1* 9.9* 11.6*  HCT 37.3 33.0* 34.0*  MCV 101.6* 101.2*  --   PLT 239 215  --    Blood Culture    Component Value Date/Time   SDES FLUID LEFT PLEURAL 04/06/2017 1248   SDES FLUID LEFT PLEURAL 04/06/2017 1248   SPECREQUEST NONE 04/06/2017 1248   SPECREQUEST NONE 04/06/2017 1248   CULT NO GROWTH 5 DAYS 04/06/2017 1248   REPTSTATUS 04/11/2017 FINAL 04/06/2017 1248   REPTSTATUS 04/08/2017 FINAL 04/06/2017 1248    CBG: Recent Labs  Lab 06/17/20 0638 06/17/20 0808 06/17/20 0946 06/17/20 1135 06/17/20 1213  GLUCAP 126* 116* 112* 98 105*     Studies/Results: No results found. Medications:  . apixaban  2.5 mg Oral BID  . calcium acetate  667 mg Oral TID with meals  . carbidopa-levodopa  1 tablet Oral BID  . Chlorhexidine Gluconate Cloth  6 each Topical Q0600  . Chlorhexidine Gluconate Cloth  6 each Topical Q0600  . colchicine  0.6 mg Oral Daily  . darbepoetin (ARANESP) injection - DIALYSIS  40 mcg Intravenous Q Wed-HD  . febuxostat  40 mg Oral Daily  . [START ON 06/18/2020] gabapentin  300 mg Oral QHS  . insulin aspart  0-9 Units Subcutaneous TID WC  . insulin NPH Human  20 Units Subcutaneous BID AC & HS  . multivitamin  1 tablet Oral Daily  . naLOXone (NARCAN)  injection  0.4 mg Intravenous Once  . pantoprazole  40 mg Oral Daily  . polyethylene glycol  17 g Oral Daily  . rOPINIRole  4 mg Oral BID  . Zinc Oxide   Topical QID    Dialysis: [facility] MWF  4h 7min  90.5kg  2/2 bath  AVF  15g  Hep 3000 Hectorol 2 mcg IV q HD  Assessment/Plan: 1. Chronic ankle and subtalar arthritis with acute medial malleolar fracture: Seen by  Dr. Sharol Given, planned for tibial talar calcaneal fusion today but appears it was postponed due to resp issues.  2. AMS/ resp failure hypercapneic: on BiPap, CO2 retention. No edema on CXR. Improving per pmd.   3.  ESRD: usual HD is MWF.  Pt is unstable now on bipap and poorly responsive, will postpone HD until tomorrow.  Have d/w primary MD.  4.  Hypertension/volume: BP controlled, trace edema but hx of chronic lymphedema. Net UF goal 1-1.5 L.   5.  Anemia: Hgb 9.9. Will plan to start aranesp next HD, anticipate post op decrease in hemoglobin.  6.  Metabolic bone disease: Corrected calcium at goal. Continue hectorol. Continue calcium acetate and follow phos level.  7.  Nutrition:  Renal/carb modified diet.  8. T2DM: On insulin, per primary team  Kelly Splinter, MD 06/17/2020, 3:48 PM

## 2020-06-18 ENCOUNTER — Telehealth: Payer: Self-pay | Admitting: Pulmonary Disease

## 2020-06-18 LAB — BLOOD GAS, ARTERIAL
Acid-Base Excess: 4.9 mmol/L — ABNORMAL HIGH (ref 0.0–2.0)
Bicarbonate: 31.6 mmol/L — ABNORMAL HIGH (ref 20.0–28.0)
Drawn by: 270271
FIO2: 40
O2 Saturation: 95.5 %
Patient temperature: 37
pCO2 arterial: 72.7 mmHg (ref 32.0–48.0)
pH, Arterial: 7.261 — ABNORMAL LOW (ref 7.350–7.450)
pO2, Arterial: 98.2 mmHg (ref 83.0–108.0)

## 2020-06-18 LAB — GLUCOSE, CAPILLARY
Glucose-Capillary: 58 mg/dL — ABNORMAL LOW (ref 70–99)
Glucose-Capillary: 100 mg/dL — ABNORMAL HIGH (ref 70–99)
Glucose-Capillary: 103 mg/dL — ABNORMAL HIGH (ref 70–99)
Glucose-Capillary: 82 mg/dL (ref 70–99)
Glucose-Capillary: 180 mg/dL — ABNORMAL HIGH (ref 70–99)

## 2020-06-18 LAB — CBC
HCT: 33.1 % — ABNORMAL LOW (ref 36.0–46.0)
Hemoglobin: 9.7 g/dL — ABNORMAL LOW (ref 12.0–15.0)
MCH: 30.1 pg (ref 26.0–34.0)
MCHC: 29.3 g/dL — ABNORMAL LOW (ref 30.0–36.0)
MCV: 102.8 fL — ABNORMAL HIGH (ref 80.0–100.0)
Platelets: 188 10*3/uL (ref 150–400)
RBC: 3.22 MIL/uL — ABNORMAL LOW (ref 3.87–5.11)
RDW: 14.9 % (ref 11.5–15.5)
WBC: 8.1 10*3/uL (ref 4.0–10.5)
nRBC: 0 % (ref 0.0–0.2)

## 2020-06-18 LAB — RENAL FUNCTION PANEL
Albumin: 2.3 g/dL — ABNORMAL LOW (ref 3.5–5.0)
Anion gap: 11 (ref 5–15)
BUN: 51 mg/dL — ABNORMAL HIGH (ref 8–23)
CO2: 29 mmol/L (ref 22–32)
Calcium: 8.6 mg/dL — ABNORMAL LOW (ref 8.9–10.3)
Chloride: 99 mmol/L (ref 98–111)
Creatinine, Ser: 4.74 mg/dL — ABNORMAL HIGH (ref 0.44–1.00)
GFR calc Af Amer: 10 mL/min — ABNORMAL LOW (ref >60)
GFR calc non Af Amer: 8 mL/min — ABNORMAL LOW (ref >60)
Glucose, Bld: 91 mg/dL (ref 70–99)
Phosphorus: 6.3 mg/dL — ABNORMAL HIGH (ref 2.5–4.6)
Potassium: 4.7 mmol/L (ref 3.5–5.1)
Sodium: 139 mmol/L (ref 135–145)

## 2020-06-18 MED ORDER — DEXTROSE 50 % IV SOLN: 12.5000 g | INTRAVENOUS | Status: AC

## 2020-06-18 MED ORDER — DEXTROSE 50 % IV SOLN
INTRAVENOUS | Status: AC
  Administered 2020-06-18: 12.5 g via INTRAVENOUS
  Filled 2020-06-18: qty 50

## 2020-06-18 MED ORDER — HEPARIN SODIUM (PORCINE) 1000 UNIT/ML DIALYSIS: 3000.0000 [IU] | INTRAMUSCULAR | Status: DC | PRN

## 2020-06-18 MED ORDER — DARBEPOETIN ALFA 40 MCG/0.4ML IJ SOSY
40.0000 ug | PREFILLED_SYRINGE | Freq: Once | INTRAMUSCULAR | Status: AC
  Administered 2020-06-18: 40 ug via INTRAVENOUS
  Filled 2020-06-18: qty 0.4

## 2020-06-18 MED ORDER — INSULIN NPH (HUMAN) (ISOPHANE) 100 UNIT/ML ~~LOC~~ SUSP
10.0000 [IU] | Freq: Two times a day (BID) | SUBCUTANEOUS | Status: DC
  Administered 2020-06-18 – 2020-06-21 (×5): 10 [IU] via SUBCUTANEOUS
  Filled 2020-06-18: qty 10

## 2020-06-18 MED ORDER — ALBUMIN HUMAN 25 % IV SOLN: 25.0000 g | Freq: Once | INTRAVENOUS | Status: AC

## 2020-06-18 MED ORDER — ALBUMIN HUMAN 25 % IV SOLN
INTRAVENOUS | Status: AC
  Administered 2020-06-18: 25 g via INTRAVENOUS
  Filled 2020-06-18: qty 100

## 2020-06-18 MED ORDER — DARBEPOETIN ALFA 40 MCG/0.4ML IJ SOSY
PREFILLED_SYRINGE | INTRAMUSCULAR | Status: AC
  Filled 2020-06-18: qty 0.4

## 2020-06-18 NOTE — Progress Notes (Signed)
[facility] Progress Note   Subjective: seen in room, off of bipap, awake and being fed fruit cup by the tech, pt w/o c/o's.   Objective Vitals:   06/18/20 1330 06/18/20 1400 06/18/20 1430 06/18/20 1500  BP: (!) 84/44 (!) 95/31 (!) 80/28 (!) 72/22  Pulse: 75 64 68 70  Resp: (!) 23 (!) 22 20 20   Temp:      TempSrc:      SpO2: 96% 94% 97% 96%  Weight:      Height:       Physical Exam General: obese WF, awake, chron ill appearing Heart: RRR, no murmurs, rubs or gallops Lungs: CTA bilaterally without wheezing, rhonchi or rales Abdomen: Soft, non-tender, non-distended, +BS Extremities: Trace pitting edema bilateral lower extremities Dialysis Access: RUE AVF accessed  Dialysis: [facility] MWF  4h 47min  90.5kg  2/2 bath  AVF  15g  Hep 3000 Hectorol 2 mcg IV q HD  Assessment/Plan: 1. 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 Sharol Given, planned for tibial talar calcaneal fusion but pt declined so surgery is postponed.  2. AMS/ resp failure hypercapneic: sp BiPap, CXR negative. Better.  3.  ESRD: usual HD is MWF.  HD today and Sat then resume MWF.  4.  BP/ volume:  Up 3kg by wts, no vol^ on exam. BP's dropping on HD, will not get volume off w/ hd today 5.  Anemia: Hgb 9.9. Will plan to start aranesp next HD 6.  Metabolic bone disease: Corrected calcium at goal. Continue hectorol. Continue calcium acetate and follow phos level.  7.  Nutrition:  Renal/carb modified diet.  8. T2DM: On insulin, per primary team  Kelly Splinter, MD 06/18/2020, 3:26 PM  Additional Objective Labs: Basic Metabolic Panel: Recent Labs  Lab 06/16/20 0212 06/16/20 0212 06/17/20 1158 06/17/20 1528 06/18/20 0703  NA 140   < > 136 138 139  K 4.3   < > 4.7 4.8 4.7  CL 99   < > 98 98 99  CO2 29  --   --  31 29  GLUCOSE 204*   < > 107* 109* 91  BUN 81*   < > 42* 39* 51*  CREATININE 5.06*   < > 3.70* 4.01* 4.74*  CALCIUM 8.2*  --   --  8.7* 8.6*  PHOS  --    --   --   --  6.3*   < > = values in this interval not displayed.   Liver Function Tests: Recent Labs  Lab 06/14/20 1634 06/18/20 0703  AST 26  --   ALT 25  --   ALKPHOS 91  --   BILITOT 0.9  --   PROT 5.6*  --   ALBUMIN 2.9* 2.3*   CBC: Recent Labs  Lab 06/14/20 1634 06/14/20 1634 06/16/20 0212 06/16/20 0212 06/17/20 1158 06/17/20 1528 06/18/20 0703  WBC 10.4   < > 10.0  --   --  11.2* 8.1  NEUTROABS 9.0*  --   --   --   --   --   --   HGB 11.1*   < > 9.9*   < > 11.6* 9.9* 9.7*  HCT 37.3   < > 33.0*   < > 34.0* 34.3* 33.1*  MCV 101.6*  --  101.2*  --   --  103.9* 102.8*  PLT 239   < > 215  --   --  204 188   < > = values  in this interval not displayed.   Blood Culture    Component Value Date/Time   SDES FLUID LEFT PLEURAL 04/06/2017 1248   SDES FLUID LEFT PLEURAL 04/06/2017 1248   SPECREQUEST NONE 04/06/2017 1248   SPECREQUEST NONE 04/06/2017 1248   CULT NO GROWTH 5 DAYS 04/06/2017 1248   REPTSTATUS 04/11/2017 FINAL 04/06/2017 1248   REPTSTATUS 04/08/2017 FINAL 04/06/2017 1248    CBG: Recent Labs  Lab 06/17/20 1612 06/17/20 2104 06/18/20 0617 06/18/20 0649 06/18/20 1152  GLUCAP 97 79 58* 100* 103*    Studies/Results: DG CHEST PORT 1 VIEW  Result Date: 06/17/2020 CLINICAL DATA:  Respiratory distress EXAM: PORTABLE CHEST 1 VIEW COMPARISON:  06/15/2020 FINDINGS: Single frontal view of the chest demonstrates an unremarkable cardiac silhouette. Mild diffuse interstitial prominence is unchanged, compatible with scarring. No focal consolidation, effusion, or pneumothorax. Stable anterior subluxation or dislocation of the bilateral glenohumeral joints. IMPRESSION: 1. Stable exam, no acute process. Electronically Signed   By: Randa Ngo M.D.   On: 06/17/2020 15:47   Medications:  . apixaban  2.5 mg Oral BID  . calcium acetate  667 mg Oral TID with meals  . carbidopa-levodopa  1 tablet Oral BID  . Chlorhexidine Gluconate Cloth  6 each Topical Q0600  .  colchicine  0.6 mg Oral Daily  . darbepoetin (ARANESP) injection - DIALYSIS  40 mcg Intravenous Q Wed-HD  . febuxostat  40 mg Oral Daily  . gabapentin  300 mg Oral QHS  . insulin aspart  0-9 Units Subcutaneous TID WC  . insulin NPH Human  10 Units Subcutaneous BID AC & HS  . multivitamin  1 tablet Oral Daily  . pantoprazole  40 mg Oral Daily  . polyethylene glycol  17 g Oral Daily  . rOPINIRole  4 mg Oral BID  . Zinc Oxide   Topical QID

## 2020-06-18 NOTE — Progress Notes (Addendum)
NAME:  Jean Mcclain, MRN:  [id number], DOB:  1946-01-01, LOS: 4 ADMISSION DATE:  06/14/2020, CONSULTATION DATE: 06/17/2020 REFERRING MD: Dr. Lucianne Lei, CHIEF COMPLAINT: Altered mental status  Brief History   74 year old female, morbidly obese, found to have elevated PCO2, acute hypercarbic respiratory failure placed on BiPAP.  PCO2 greater than 80.  Pulmonary was consulted for recommendations and management.  History of present illness   74 year old gentleman past medical history of DVT on Eliquis, ESRD, on IHD Monday Wednesday Friday time insulin-dependent diabetes, morbid obesity BMI 40, OSA not on CPAP, hypertension, hyperlipidemia, gastroesophageal reflux disease and 2 L nightly O2.  Patient was found to have a transverse mildly displaced fracture of the medial malleolus and subtalar dislocation with lateral angulation and lateral subluxation of the navicular at the talonavicular joint.  Orthopedic surgery was consulted.  Hospitalist service admitted for medical management.  Patient was plan to go to the OR today however this was canceled.  Patient had an arterial blood gas which revealed acute hypercarbic respiratory failure.  Patient was placed on BiPAP.  The initial BiPAP settings appeared to have a respiratory rate of 8 with a minute ventilation of only 4.8 L upon presentation to the room.  Patient was arousable to voice.  When we stimulated her she would open her eyes Daneil Dan follows some basic commands.  We did make some ventilator adjustments after few moments patient started to respond a little bit more.  I think we should give her more time on BiPAP therapy.  This was discussed with RT on the floor.   Past Medical History   Past Medical History:  Diagnosis Date  . Anxiety   . Arthritis    osteo  . Chronic kidney disease   . Diabetes mellitus, type 2 (HCC)    Type II  . DVT (deep venous thrombosis) ([place]) 03/2017   left popliteal  . Facet joint disease   . Facet joint disease    foot   . Fibula fracture 02/2017   left  . GERD (gastroesophageal reflux disease)   . Hyperlipidemia   . Hypertension   . OSA (obstructive sleep apnea)    does not use CPAP   . Osteopenia   . Shortness of breath    with exertion  . Venous stasis ulcers ([place])   . Vitamin D deficiency      Significant Hospital Events     Consults:  PCCM ortho  Procedures:    Significant Diagnostic Tests:    Micro Data:  covid neg   Antimicrobials:  None    Interim history/subjective:  Awake and alert this am on bipap.  Denies SOB.   ABG this am 7.26/72.7/98/31.6     Objective   Blood pressure (!) 131/53, pulse 70, temperature 97.9 F (36.6 C), resp. rate (!) 24, height 5' (1.524 m), weight 97.1 kg, SpO2 97 %.    FiO2 (%):  [40 %] 40 %  No intake or output data in the 24 hours ending 06/18/20 1027 Filed Weights   06/16/20 0354 06/16/20 0809 06/18/20 0018  Weight: 93.6 kg 92.6 kg 97.1 kg    Examination: General: Obese female on BiPAP, awake, alert  HENT: Large neck, NCAT,mm dry on bipap  Lungs: Bilateral ventilated breath sounds on NIPPV Cardiovascular: Regular rhythm, S1-S2 Abdomen: Obese pannus soft nontender nondistended Extremities: No significant Neuro: awake, alert, appropriate, MAE  GU: Deferred  Resolved Hospital Problem list     Assessment & Plan:   Acute hypercarbic respiratory failure  requiring NIPPV Morbid obesity Body mass index is 41.79 kg/m.  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6* 9.9* 9.7*  HCT 37.3 33.0* 34.0* 34.3* 33.1*  MCV 101.6* 101.2*  --  103.9* 102.8*  PLT 239 215  --  204 194    Basic Metabolic Panel: Recent Labs  Lab 06/14/20 1634 06/16/20 0212 06/17/20 1158 06/17/20 1528 06/18/20 0703  NA 143 140 136 138 139  K 4.8 4.3 4.7 4.8 4.7  CL 99 99 98 98 99  CO2 28 29  --  31 29  GLUCOSE 132* 204* 107* 109* 91  BUN 71* 81* 42* 39* 51*  CREATININE 4.81* 5.06* 3.70* 4.01* 4.74*  CALCIUM 8.4* 8.2*  --  8.7* 8.6*  PHOS  --   --   --   --  6.3*   GFR: Estimated Creatinine Clearance: 10.9 mL/min (A) (by C-G formula based on SCr of 4.74 mg/dL (H)). Recent Labs  Lab 06/14/20 1634 06/16/20 0212 06/17/20 1528 06/18/20 0703  WBC 10.4 10.0 11.2* 8.1    Liver Function Tests: Recent Labs  Lab 06/14/20 1634 06/18/20 0703  AST 26  --   ALT 25  --   ALKPHOS 91  --   BILITOT 0.9  --   PROT 5.6*  --   ALBUMIN 2.9* 2.3*   No results for input(s): LIPASE, AMYLASE in the last 168 hours. No results for input(s): AMMONIA in the last 168 hours.  ABG    Component Value Date/Time   PHART 7.261 (L) 06/18/2020 0432   PCO2ART 72.7 (HH) 06/18/2020 0432   PO2ART 98.2 06/18/2020 0432   HCO3 31.6 (H) 06/18/2020 0432   TCO2 32 06/17/2020 1158   ACIDBASEDEF 2.6 (H) 03/31/2017 0411   O2SAT 95.5 06/18/2020 0432     Coagulation Profile: No results for input(s): INR, PROTIME in the last 168 hours.  Cardiac Enzymes: No results for input(s): CKTOTAL, CKMB, CKMBINDEX, TROPONINI in the last 168 hours.  HbA1C: Hgb A1c MFr Bld  Date/Time Value Ref Range Status  01/22/2020 01:03 PM 6.3 (H) 4.8 - 5.6 % Final    Comment:    (NOTE) Pre diabetes:          5.7%-6.4% Diabetes:              >6.4% Glycemic control for   <7.0% adults with diabetes   03/11/2017 12:28 AM 7.1 (H) 4.8 - 5.6 % Final    Comment:     (NOTE)         Pre-diabetes: 5.7 - 6.4         Diabetes: >6.4         Glycemic control for adults with diabetes: <7.0     CBG: Recent Labs  Lab 06/17/20 1213 06/17/20 1612 06/17/20 2104 06/18/20 0617  06/18/20 [place], NP Pulmonary/Critical Care Medicine  06/18/2020  10:27 AM    Pulmonary critical care attending:  This 74 year old female admitted for ankle fracture.  She is morbidly obese has a BMI of greater than 40.  Likely has obesity hypoventilation syndrome per arterial blood gas analysis.  Patient developed acute hypercarbic respiratory failure requiring NIPPV.  Pulmonary was consulted for recommendations management.  Patient tolerated changes in her BiPAP settings yesterday.  Her ABG has improved.  Her mental status is also improved from CO2 narcosis.  She is able to follow commands much more awake this morning.  Discussed case with Dr. Lucianne Lei this morning as well.  BP (!) 84/44 (BP Location: Left Arm)   Pulse 75   Temp 98.3 F (36.8 C) (Oral)   Resp (!) 23   Ht 5' (1.524 m)   Wt 95.7 kg   SpO2 96%   BMI 41.21 kg/m   General: Obese female resting in bed on BiPAP Heart: Regular rhythm S1-S2 Lungs: Bilateral mechanically ventilated breath sounds on NIPPV.  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  Plan: Patient is high risk for recurrent episodes of hypercarbic respiratory failure. I do believe that she would benefit from use of BiPAP at nighttime. At minimum would need CPAP with her history of OSA. She needs to follow-up in sleep clinic as soon as possible upon discharge.  I will help arrange this in our office.  At this time okay to come off BiPAP.  And take medications by mouth with advancement of diet sips and chips.  Continue to monitor closely. HD per  nephrology for volume removal.  Ankle fracture management per orthopedic surgery.  Pulmonary critical care was glad to help in consultation.  We will sign off at this time.  We will help arrange outpatient follow-up regarding BiPAP needs.  Would recommend discharge with BiPAP for diagnosis of obesity ventilation syndrome and chronic hypercapnia.  Garner Nash, DO [facility] 06/18/2020 2:01 PM

## 2020-06-18 NOTE — Evaluation (Signed)
Physical Therapy Evaluation Patient Details Name: Jean Mcclain MRN: [id number] DOB: January 04, 1946 Today's Date: 06/18/2020   History of Present Illness  74 year old female with history of prior DVT on Eliquis, ESRD on HD MWF, IDDM, morbid obesity with a BMI of 40, OSA not on CPAP, hypertension, hyperlipidemia, GERD, nightly oxygen (2L) came into the hospital with worsening left ankle and foot pain with decreased mobility. This has been going on for a while, does not really recall an injury to the foot. Imaging of the left foot in the ED showed transverse mildly displaced fracture to the medial malleolus, subtalar dislocation with lateral angulation of the majority of the foot, lateral subluxation of the navicular at the talonavicular joint. Orthopedic surgery was consulted and she was admitted to the hospital.  Plan for surgery on 9/8 but patient was confused and lethargic at times  Clinical Impression   Patient received in bed, pleasant and willing to attempt therapy but lethargic and falling asleep while talking to therapists at times. Unable to successfully even come to long sitting using B bed rails, also very weak and barely able to lift BLEs against gravity; left LE strength definitely pain inhibited as appropriate given current injury. Evaluation limited by patient lethargy as well as transport arriving to take patient for HD session. Left in bed ready for dialysis. In definite need of skilled rehab in the SNF setting moving forward.     Follow Up Recommendations SNF;Supervision/Assistance - 24 hour    Equipment Recommendations  Other (comment) (defer to next venue)    Recommendations for Other Services       Precautions / Restrictions Precautions Precautions: Fall Required Braces or Orthoses: Other Brace (CAM boot) Other Brace: CAM boot LLE Restrictions Weight Bearing Restrictions: Yes LLE Weight Bearing: Weight bearing as tolerated Other Position/Activity Restrictions: needs to be  in L CAM boot for WBAT      Mobility  Bed Mobility               General bed mobility comments: unsuccessfully attempted long sitting, max difficulty moving BLE. total A +2 for bed mobility  Transfers                 General transfer comment: unable to assess  Ambulation/Gait             General Gait Details: unable to assess  Stairs            Wheelchair Mobility    Modified Rankin (Stroke Patients Only)       Balance                                             Pertinent Vitals/Pain Pain Assessment: Faces Faces Pain Scale: Hurts little more Pain Location: neck and head Pain Descriptors / Indicators: Aching Pain Intervention(s): Monitored during session;Limited activity within patient's tolerance;Repositioned;Patient requesting pain meds-RN notified    Home Living Family/patient expects to be discharged to:: Other (Comment) (ILF)                 Additional Comments: uses WC and on bad days she can call CMA to help with transfers; does not use walker ot get in [facility] and usually does not need help with bathing/dressing but sponge baths    Prior Function Level of Independence: Independent with assistive device(s)         Comments:  occasional assist from [place]; has RW but does not use it. When feeling good mostly independent.     Hand Dominance   Dominant Hand: Right    Extremity/Trunk Assessment   Upper Extremity Assessment Upper Extremity Assessment: Defer to OT evaluation RUE Deficits / Details: "I've got bad shoulders" LUE Deficits / Details: "I've got bad shoulders"    Lower Extremity Assessment Lower Extremity Assessment: RLE deficits/detail;LLE deficits/detail RLE Deficits / Details: grossly 2+ to 3-/5, barely able to lift LE off of bed against gravity RLE Coordination:  (limited by weakness) LLE Deficits / Details: grossly 2+ to 3-/5 and pain inhibited due to fracture site LLE: Unable to fully assess  due to pain    Cervical / Trunk Assessment Cervical / Trunk Assessment: Kyphotic  Communication   Communication: No difficulties  Cognition Arousal/Alertness: Lethargic Behavior During Therapy: WFL for tasks assessed/performed Overall Cognitive Status: Difficult to assess                                 General Comments: lethargic for most of session. a lit more alert at end of session. Falling asleep while talking      General Comments General comments (skin integrity, edema, etc.): unable to successfully get to EOB at eval- would functionally estimate fair to poor sitting balance and poor standing balance based on strength/endurance deficits found at bed level eval    Exercises     Assessment/Plan    PT Assessment Patient needs continued PT services  PT Problem List Decreased strength;Obesity;Decreased knowledge of use of DME;Decreased activity tolerance;Decreased safety awareness;Decreased balance;Decreased knowledge of precautions;Pain;Decreased mobility;Decreased coordination       PT Treatment Interventions DME instruction;Balance training;Gait training;Stair training;Functional mobility training;Patient/family education;Therapeutic activities;Therapeutic exercise    PT Goals (Current goals can be found in the Care Plan section)  Acute Rehab PT Goals Patient Stated Goal: did not state PT Goal Formulation: With patient Time For Goal Achievement: 07/02/20 Potential to Achieve Goals: Fair    Frequency Min 2X/week   Barriers to discharge        Co-evaluation   Reason for Co-Treatment: For patient/therapist safety;Complexity of the patient's impairments (multi-system involvement)   OT goals addressed during session: ADL's and self-care;Strengthening/ROM       AM-PAC PT "6 Clicks" Mobility  Outcome Measure Help needed turning from your back to your side while in a flat bed without using bedrails?: A Lot Help needed moving from lying on your back to  sitting on the side of a flat bed without using bedrails?: Total Help needed moving to and from a bed to a chair (including a wheelchair)?: Total Help needed standing up from a chair using your arms (e.g., wheelchair or bedside chair)?: Total Help needed to walk in hospital room?: Total Help needed climbing 3-5 steps with a railing? : Total 6 Click Score: 7    End of Session Equipment Utilized During Treatment: Other (comment) (CAM boot L LE) Activity Tolerance: Patient limited by fatigue;Other (comment) (session limited by patient needing to leave for HD) Patient left: in bed;with call bell/phone within reach Nurse Communication: Mobility status PT Visit Diagnosis: Unsteadiness on feet (R26.81);Difficulty in walking, not elsewhere classified (R26.2);Muscle weakness (generalized) (M62.81);Pain Pain - Right/Left: Left Pain - part of body: Leg    Time: 6834-1962 PT Time Calculation (min) (ACUTE ONLY): 12 min   Charges:   PT Evaluation $PT Eval Moderate Complexity: 1 Mod  Windell Norfolk, DPT, PN1   Supplemental Physical Therapist [facility]    Pager [phone] Acute Rehab Office [phone]

## 2020-06-18 NOTE — Progress Notes (Signed)
   06/18/20 1525  Vital Signs  Temp 98.2 F (36.8 C)  Temp Source Oral  Pulse Rate 75  Pulse Rate Source Monitor  Resp (!) 21  BP (!) 113/49  BP Location Left Arm  BP Method Automatic  Patient Position (if appropriate) Lying  Oxygen Therapy  SpO2 97 %  O2 Device Nasal Cannula  O2 Flow Rate (L/min) 3 L/min  Dialysis Weight  Weight 96.2 kg  Type of Weight Post-Dialysis  Post-Hemodialysis Assessment  Rinseback Volume (mL) 250 mL  KECN 236 V  Dialyzer Clearance Lightly streaked  Duration of HD Treatment -hour(s) 3 hour(s)  Hemodialysis Intake (mL) 1000 mL  UF Total -Machine (mL) 738 mL  Net UF (mL) -262 mL  Tolerated HD Treatment No (Comment) (hypotensive throught tx, dr Jonnie Finner aware)  Post-Hemodialysis Comments tx complete-pt stable  AVG/AVF Arterial Site Held (minutes) 10 minutes  AVG/AVF Venous Site Held (minutes) 10 minutes  Fistula / Graft Right Upper arm Arteriovenous fistula  Placement Date/Time: 08/20/18 3428   Orientation: Right  Access Location: Upper arm  Access Type: (c) Arteriovenous fistula  Site Condition No complications  Fistula / Graft Assessment Present;Thrill;Bruit  Status Deaccessed;Flushed  Drainage Description None  UF goal not met due to persistent hypotension throughout hd tx, despite intervention per Dr.Schertz.Given albumin 25 gm, in addition to 524m NS bolus, minimal effects noted.

## 2020-06-18 NOTE — Progress Notes (Signed)
Pt is refusing the BIPAP at this moment told her to let the nurse know if she changes her mind. RT will continue to monitor.

## 2020-06-18 NOTE — Telephone Encounter (Signed)
PCCM:  Patient seen in the hospital for hypercapnic respiratory failure.  Patient of Dr. Ander Slade.  Please have follow-up arranged in clinic with him 1 to 2 weeks from now.  Likely benefit from BiPAP therapy.  Garner Nash, DO [facility] 06/18/2020 2:04 PM

## 2020-06-18 NOTE — Progress Notes (Signed)
Progress Note    Jean Mcclain  PZW:[id number] DOB: 1946-06-21  DOA: 06/14/2020 PCP: Leanna Battles, MD    Brief Narrative:    Medical records reviewed and are as summarized below: 74 year old female with history of prior DVT on Eliquis, ESRD on HD MWF, IDDM, morbid obesity with a BMI of 40, OSA not on CPAP, hypertension, hyperlipidemia, GERD, nightly oxygen (2L) came into the hospital with worsening left ankle and foot pain with decreased mobility. This has been going on for a while, does not really recall an injury to the foot. Imaging of the left foot in the ED showed transverse mildly displaced fracture to the medial malleolus, subtalar dislocation with lateral angulation of the majority of the foot, lateral subluxation of the navicular at the talonavicular joint. Orthopedic surgery was consulted and she was admitted to the hospital.  Plan for surgery on 9/8 but patient was confused and lethargic at times   Assessment/Plan:   Principal Problem:   Ankle fracture Active Problems:   Insulin-requiring or dependent type II diabetes mellitus ([place])   Essential hypertension   Pressure injury of skin   End stage renal disease (HCC)   HLD (hyperlipidemia)   Primary osteoarthritis, left ankle and foot   Left ankle fracture with deformity -orthopedics consulted -planned for surgery on 9/8 but patient had hypercapnic resp failure -now plan is for surgery next week: Anticipate patient will need discharge to skilled nursing.  Orders are placed for a fracture boot weightbearing as tolerated on the left lower extremity.  We will plan to follow-up in the office after discharge to arrange for surgical intervention.  If patient is still hospitalized and stable next Wednesday we could proceed with surgery next Wednesday.  Acute on Chronic respiratory failure with hypercapnia due to  OSA -does not tolerate CPAP QHS -on 2L [place] at night -DOES NOT NEED TO BE 100%- prefer sats between  88-90% -improved on bipap  Type 2 DM -SSI -resume NPH tonight if eating  ESRD -on HD -nephrology consult appreciated  Chronic disease -Transfuse for hemoglobin less 7  Pressure ulcer- POA Pressure Injury 06/14/20 Buttocks Right;Posterior Stage 2 -  Partial thickness loss of dermis presenting as a shallow open injury with a red, pink wound bed without slough. (Active)  06/14/20 2200  Location: Buttocks  Location Orientation: Right;Posterior  Staging: Stage 2 -  Partial thickness loss of dermis presenting as a shallow open injury with a red, pink wound bed without slough.  Wound Description (Comments):   Present on Admission: Yes  -WOC consult: Using no rinse spray, cleanse buttocks and bilateral posterior upper thighs. Apply triple paste.  mattress with low air loss feature.   H/o DVT -resume eliquis since surgery has been post-poned  RLS -on requip  Morbid obesity Body mass index is 41.79 kg/m.   From chart review it appears patient's prednisone was stopped in April    Family Communication/Anticipated D/C date and plan/Code Status   DVT prophylaxis: restart eliquis after surgery Code Status: Full Code.  Family Communication: patient Disposition Plan: Status is: Inpatient  Remains inpatient appropriate because:Inpatient level of care appropriate due to severity of illness   Dispo: The patient is from: ILF?               Anticipated d/c is to: SNF              Anticipated d/c date is: 1 day  Patient currently can go to SNF and come back for surgery- TOC notified         Medical Consultants:    Orthopedics  PCCM     Subjective:   More awake, able to give name, place and why she is here  Objective:    Vitals:   06/18/20 0841 06/18/20 0845 06/18/20 0927 06/18/20 1100  BP:  (!) 111/48 (!) 131/53   Pulse:  70    Resp:  (!) 24    Temp: 97.9 F (36.6 C)   98.4 F (36.9 C)  TempSrc:    Oral  SpO2:  97%    Weight:      Height:        No intake or output data in the 24 hours ending 06/18/20 1144 Filed Weights   06/16/20 0354 06/16/20 0809 06/18/20 0018  Weight: 93.6 kg 92.6 kg 97.1 kg    Exam:  General: Appearance:    Obese female on bipap. No distress     Lungs:      respirations unlabored, diminished due to size  Heart:    Normal heart rate. Normal rhythm. No murmurs, rubs, or gallops.   MS:   All extremities are intact.   Neurologic:  Sleepy     Data Reviewed:   I have personally reviewed following labs and imaging studies:  Labs: Labs show the following:   Basic Metabolic Panel: Recent Labs  Lab 06/14/20 1634 06/14/20 1634 06/16/20 0212 06/16/20 0212 06/17/20 1158 06/17/20 1158 06/17/20 1528 06/18/20 0703  NA 143  --  140  --  136  --  138 139  K 4.8   < > 4.3   < > 4.7   < > 4.8 4.7  CL 99  --  99  --  98  --  98 99  CO2 28  --  29  --   --   --  31 29  GLUCOSE 132*  --  204*  --  107*  --  109* 91  BUN 71*  --  81*  --  42*  --  39* 51*  CREATININE 4.81*  --  5.06*  --  3.70*  --  4.01* 4.74*  CALCIUM 8.4*  --  8.2*  --   --   --  8.7* 8.6*  PHOS  --   --   --   --   --   --   --  6.3*   < > = values in this interval not displayed.   GFR Estimated Creatinine Clearance: 10.9 mL/min (A) (by C-G formula based on SCr of 4.74 mg/dL (H)). Liver Function Tests: Recent Labs  Lab 06/14/20 1634 06/18/20 0703  AST 26  --   ALT 25  --   ALKPHOS 91  --   BILITOT 0.9  --   PROT 5.6*  --   ALBUMIN 2.9* 2.3*   No results for input(s): LIPASE, AMYLASE in the last 168 hours. No results for input(s): AMMONIA in the last 168 hours. Coagulation profile No results for input(s): INR, PROTIME in the last 168 hours.  CBC: Recent Labs  Lab 06/14/20 1634 06/16/20 0212 06/17/20 1158 06/17/20 1528 06/18/20 0703  WBC 10.4 10.0  --  11.2* 8.1  NEUTROABS 9.0*  --   --   --   --   HGB 11.1* 9.9* 11.6* 9.9* 9.7*  HCT 37.3 33.0* 34.0* 34.3* 33.1*  MCV 101.6* 101.2*  --  103.9* 102.8*  PLT 239  215  --  204 188   Cardiac Enzymes: No results for input(s): CKTOTAL, CKMB, CKMBINDEX, TROPONINI in the last 168 hours. BNP (last 3 results) No results for input(s): PROBNP in the last 8760 hours. CBG: Recent Labs  Lab 06/17/20 1213 06/17/20 1612 06/17/20 2104 06/18/20 0617 06/18/20 0649  GLUCAP 105* 97 79 58* 100*   D-Dimer: No results for input(s): DDIMER in the last 72 hours. Hgb A1c: No results for input(s): HGBA1C in the last 72 hours. Lipid Profile: No results for input(s): CHOL, HDL, LDLCALC, TRIG, CHOLHDL, LDLDIRECT in the last 72 hours. Thyroid function studies: No results for input(s): TSH, T4TOTAL, T3FREE, THYROIDAB in the last 72 hours.  Invalid input(s): FREET3 Anemia work up: No results for input(s): VITAMINB12, FOLATE, FERRITIN, TIBC, IRON, RETICCTPCT in the last 72 hours. Sepsis Labs: Recent Labs  Lab 06/14/20 1634 06/16/20 0212 06/17/20 1528 06/18/20 0703  WBC 10.4 10.0 11.2* 8.1    Microbiology Recent Results (from the past 240 hour(s))  SARS Coronavirus 2 by RT PCR (hospital order, performed in [facility] hospital lab) Nasopharyngeal Nasopharyngeal Swab     Status: None   Collection Time: 06/14/20  4:35 PM   Specimen: Nasopharyngeal Swab  Result Value Ref Range Status   SARS Coronavirus 2 NEGATIVE NEGATIVE Final    Comment: (NOTE) SARS-CoV-2 target nucleic acids are NOT DETECTED.  The SARS-CoV-2 RNA is generally detectable in upper and lower respiratory specimens during the acute phase of infection. The lowest concentration of SARS-CoV-2 viral copies this assay can detect is 250 copies / mL. A negative result does not preclude SARS-CoV-2 infection and should not be used as the sole basis for treatment or other patient management decisions.  A negative result may occur with improper specimen collection / handling, submission of specimen other than nasopharyngeal swab, presence of viral mutation(s) within the areas targeted by this assay, and  inadequate number of viral copies (<250 copies / mL). A negative result must be combined with clinical observations, patient history, and epidemiological information.  Fact Sheet for Patients:   StrictlyIdeas.no  Fact Sheet for Healthcare Providers: BankingDealers.co.za  This test is not yet approved or  cleared by the [place] FDA and has been authorized for detection and/or diagnosis of SARS-CoV-2 by FDA under an Emergency Use Authorization (EUA).  This EUA will remain in effect (meaning this test can be used) for the duration of the COVID-19 declaration under Section 564(b)(1) of the Act, 21 U.S.C. section 360bbb-3(b)(1), unless the authorization is terminated or revoked sooner.  Performed at [facility], [place] [address] [place], [place] [address]   Surgical pcr screen     Status: None   Collection Time: 06/14/20 10:50 PM   Specimen: Nasal Mucosa; Nasal Swab  Result Value Ref Range Status   MRSA, PCR NEGATIVE NEGATIVE Final   Staphylococcus aureus NEGATIVE NEGATIVE Final    Comment: (NOTE) The Xpert SA Assay (FDA approved for NASAL specimens in patients 39 years of age and older), is one component of a comprehensive surveillance program. It is not intended to diagnose infection nor to guide or monitor treatment. Performed at [facility], [place] [address], [place], [place] [address]     Procedures and diagnostic studies:  DG CHEST PORT 1 VIEW  Result Date: 06/17/2020 CLINICAL DATA:  Respiratory distress EXAM: PORTABLE CHEST 1 VIEW COMPARISON:  06/15/2020 FINDINGS: Single frontal view of the chest demonstrates an unremarkable cardiac silhouette. Mild diffuse interstitial prominence is unchanged, compatible with scarring.  No focal consolidation, effusion, or pneumothorax. Stable anterior subluxation or dislocation of the bilateral glenohumeral joints. IMPRESSION: 1. Stable exam, no acute process.  Electronically Signed   By: Randa Ngo M.D.   On: 06/17/2020 15:47    Medications:   . apixaban  2.5 mg Oral BID  . calcium acetate  667 mg Oral TID with meals  . carbidopa-levodopa  1 tablet Oral BID  . Chlorhexidine Gluconate Cloth  6 each Topical Q0600  . colchicine  0.6 mg Oral Daily  . darbepoetin (ARANESP) injection - DIALYSIS  40 mcg Intravenous Q Wed-HD  . darbepoetin (ARANESP) injection - DIALYSIS  40 mcg Intravenous Once  . febuxostat  40 mg Oral Daily  . gabapentin  300 mg Oral QHS  . insulin aspart  0-9 Units Subcutaneous TID WC  . multivitamin  1 tablet Oral Daily  . pantoprazole  40 mg Oral Daily  . polyethylene glycol  17 g Oral Daily  . rOPINIRole  4 mg Oral BID  . Zinc Oxide   Topical QID   Continuous Infusions:   LOS: 4 days   Geradine Girt  [facility]   How to contact the [facility] Attending or Consulting provider [place] or covering provider during after hours [place], for this patient?  1. Check the care team in [facility] and look for a) attending/consulting TRH provider listed and b) the [facility] team listed 2. Log into [email or web address] and use [place]'s universal password to access. If you do not have the password, please contact the hospital operator. 3. Locate the [facility] provider you are looking for under [facility] and page to a number that you can be directly reached. 4. If you still have difficulty reaching the provider, please page the [facility] (Director on Call) for the Hospitalists listed on amion for assistance.  06/18/2020, 11:44 AM

## 2020-06-18 NOTE — Progress Notes (Signed)
Pt admitted with the above diagnoses and presents with below problem list. Pt will benefit from continued acute OT to address the below listed deficits and maximize independence with basic ADLs prior to d/c to venue below. Pt reports she is from [place] where she operates at w/c level. She reports she occasional will need assist of CNA at ILF to get into and out of w/c. Session limited by lethargy and arrival of transport to HD. Pt presents with profound generalized weakness, decreased activity tolerance, and impaired balance. Pt is currently +2 total physical assist with bed mobility. Will need higher level of care for continued rehab at d/c prior to returning to [place].       06/18/20 1400  OT Visit Information  Last OT Received On 06/18/20  Assistance Needed +2  PT/OT/SLP Co-Evaluation/Treatment Yes  Reason for Co-Treatment For patient/therapist safety;Complexity of the patient's impairments (multi-system involvement)  OT goals addressed during session ADL's and self-care;Strengthening/ROM  History of Present Illness 74 year old female with history of prior DVT on Eliquis, ESRD on HD MWF, IDDM, morbid obesity with a BMI of 40, OSA not on CPAP, hypertension, hyperlipidemia, GERD, nightly oxygen (2L) came into the hospital with worsening left ankle and foot pain with decreased mobility. This has been going on for a while, does not really recall an injury to the foot. Imaging of the left foot in the ED showed transverse mildly displaced fracture to the medial malleolus, subtalar dislocation with lateral angulation of the majority of the foot, lateral subluxation of the navicular at the talonavicular joint. Orthopedic surgery was consulted and she was admitted to the hospital.  Plan for surgery on 9/8 but patient was confused and lethargic at times  Precautions  Precautions Fall  Required Braces or Orthoses Other Brace (CAM boot)  Restrictions  Weight Bearing Restrictions Yes  LLE Weight Bearing WBAT   Home Living  Family/patient expects to be discharged to: Other (Comment) (ILF)  Additional Comments uses WC and on bad days she can call CMA to help with transfers; does not use walker ot get in [facility] and usually does not need help with bathing/dressing but sponge baths  Prior Function  Level of Independence Independent with assistive device(s)  Comments occasional assist from CMA; has RW but does not use it. When feeling good mostly independent.  Communication  Communication No difficulties  Pain Assessment  Pain Assessment Faces  Faces Pain Scale 4  Pain Location neck and head  Pain Descriptors / Indicators Aching  Pain Intervention(s) Monitored during session;Limited activity within patient's tolerance;Repositioned;Patient requesting pain meds-RN notified  Cognition  Arousal/Alertness Lethargic  Behavior During Therapy WFL for tasks assessed/performed  Overall Cognitive Status Difficult to assess  General Comments lethargic for most of session. a lit more alert at end of session. Falling asleep while talking  Difficult to assess due to Level of arousal  Upper Extremity Assessment  Upper Extremity Assessment Generalized weakness;RUE deficits/detail;LUE deficits/detail  RUE Deficits / Details "I've got bad shoulders"  LUE Deficits / Details "I've got bad shoulders"  Lower Extremity Assessment  Lower Extremity Assessment Defer to PT evaluation  ADL  Overall ADL's  Needs assistance/impaired  Eating/Feeding Set up;Bed level  Grooming Maximal assistance;Bed level  Upper Body Bathing Maximal assistance;Bed level  Lower Body Bathing Total assistance;+2 for physical assistance;Bed level  Upper Body Dressing  Maximal assistance;Bed level  Lower Body Dressing Total assistance;+2 for physical assistance;Bed level  General ADL Comments Bed level eval due to arrival of transport to HD. Pt  with difficulty moving BLE. Lethargic this session. +2 total A for bed mobility tasks  Bed Mobility   General bed mobility comments unsuccessfully attempted long sitting, max difficulty moving BLE. total A +2 for bed mobility  Transfers  General transfer comment unable to assess  OT - End of Session  Equipment Utilized During Treatment Oxygen  Activity Tolerance Patient limited by lethargy  Patient left in bed;with call bell/phone within reach;with bed alarm set;Other (comment) (with transport to take to HD)  OT Assessment  OT Recommendation/Assessment Patient needs continued OT Services  OT Visit Diagnosis Unsteadiness on feet (R26.81);Other abnormalities of gait and mobility (R26.89);Muscle weakness (generalized) (M62.81);Pain  OT Problem List Decreased strength;Decreased activity tolerance;Impaired balance (sitting and/or standing);Decreased knowledge of use of DME or AE;Decreased knowledge of precautions;Obesity;Impaired UE functional use;Pain  OT Plan  OT Frequency (ACUTE ONLY) Min 2X/week  OT Treatment/Interventions (ACUTE ONLY) Self-care/ADL training;Therapeutic exercise;Energy conservation;DME and/or AE instruction;Therapeutic activities;Patient/family education;Balance training  AM-PAC OT "6 Clicks" Daily Activity Outcome Measure (Version 2)  Help from another person eating meals? 3  Help from another person taking care of personal grooming? 2  Help from another person toileting, which includes using toliet, bedpan, or urinal? 1  Help from another person bathing (including washing, rinsing, drying)? 1  Help from another person to put on and taking off regular upper body clothing? 1  Help from another person to put on and taking off regular lower body clothing? 1  6 Click Score 9  OT Recommendation  Follow Up Recommendations SNF  OT Equipment Other (comment) (defer to next venue)  Individuals Consulted  Consulted and Agree with Results and Recommendations Patient  Acute Rehab OT Goals  Patient Stated Goal did not state  OT Goal Formulation With patient  Time For Goal  Achievement 07/02/20  Potential to Achieve Goals Good  OT Time Calculation  OT Start Time (ACUTE ONLY) 1135  OT Stop Time (ACUTE ONLY) 1148  OT Time Calculation (min) 13 min  OT General Charges  $OT Visit 1 Visit  OT Evaluation  $OT Eval Low Complexity 1 Low  Written Expression  Dominant Hand Right    Tyrone Schimke, OT Acute Rehabilitation Services Pager: [phone] Office: [phone]

## 2020-06-18 NOTE — Progress Notes (Addendum)
Pt with hypoglycemic event this AM. CBG = 58. Pt NPO and requiring BIPAP. 12.5 mg IV Dextrose given. CBG = 100 upon 15 minute reassessment.

## 2020-06-19 LAB — GLUCOSE, CAPILLARY
Glucose-Capillary: 120 mg/dL — ABNORMAL HIGH (ref 70–99)
Glucose-Capillary: 157 mg/dL — ABNORMAL HIGH (ref 70–99)
Glucose-Capillary: 162 mg/dL — ABNORMAL HIGH (ref 70–99)
Glucose-Capillary: 139 mg/dL — ABNORMAL HIGH (ref 70–99)

## 2020-06-19 LAB — SARS CORONAVIRUS 2 BY RT PCR (HOSPITAL ORDER, PERFORMED IN ~~LOC~~ HOSPITAL LAB): SARS Coronavirus 2: NEGATIVE

## 2020-06-19 MED ORDER — OXYMETAZOLINE HCL 0.05 % NA SOLN
1.0000 | Freq: Two times a day (BID) | NASAL | Status: DC
  Administered 2020-06-19 – 2020-06-23 (×9): 1 via NASAL
  Filled 2020-06-19: qty 30

## 2020-06-19 NOTE — Progress Notes (Signed)
Progress Note    Jean Mcclain  FTD:[id number] DOB: 07/21/1946  DOA: 06/14/2020 PCP: Leanna Battles, MD    Brief Narrative:    Medical records reviewed and are as summarized below: 74 year old female with history of prior DVT on Eliquis, ESRD on HD MWF, IDDM, morbid obesity with a BMI of 40, OSA not on CPAP, hypertension, hyperlipidemia, GERD, nightly oxygen (2L) came into the hospital with worsening left ankle and foot pain with decreased mobility. This has been going on for a while, does not really recall an injury to the foot. Imaging of the left foot in the ED showed transverse mildly displaced fracture to the medial malleolus, subtalar dislocation with lateral angulation of the majority of the foot, lateral subluxation of the navicular at the talonavicular joint. Orthopedic surgery was consulted and she was admitted to the hospital.  Plan for surgery on 9/8 but patient was confused and lethargic at the time.  So surgery was delayed. Per CSW: she only has 14 days left to use for snf so she may need to have surgery before she leaves instead of leaving and going back .    Assessment/Plan:   Principal Problem:   Ankle fracture Active Problems:   Insulin-requiring or dependent type II diabetes mellitus ([place])   Essential hypertension   Pressure injury of skin   End stage renal disease (HCC)   HLD (hyperlipidemia)   Primary osteoarthritis, left ankle and foot   Left ankle fracture with deformity -orthopedics consulted -planned for surgery on 9/8 but patient had hypercapnic resp failure -now plan is for surgery next week: Anticipate patient will need discharge to skilled nursing.  Orders are placed for a fracture boot weightbearing as tolerated on the left lower extremity.  We will plan to follow-up in the office after discharge to arrange for surgical intervention.  If patient is still hospitalized and stable next Wednesday we could proceed with surgery next Wednesday. -per CSWshe  only has 14 days left to use for snf so she may need to have surgery before she leaves instead of leaving and going back    Acute on Chronic respiratory failure with hypercapnia due to  OSA -on 2L [place] at night -DOES NOT NEED TO BE 100%- prefer sats between 88-90% -improved on bipap- does not tolerate well but did wear for a time period last PM  Type 2 DM -SSI -resume NPH at a lower dose  ESRD -on HD -nephrology consult appreciated  Chronic disease -Transfuse for hemoglobin less 7  Pressure ulcer- POA Pressure Injury 06/14/20 Buttocks Right;Posterior Stage 2 -  Partial thickness loss of dermis presenting as a shallow open injury with a red, pink wound bed without slough. (Active)  06/14/20 2200  Location: Buttocks  Location Orientation: Right;Posterior  Staging: Stage 2 -  Partial thickness loss of dermis presenting as a shallow open injury with a red, pink wound bed without slough.  Wound Description (Comments):   Present on Admission: Yes  -WOC consult: Using no rinse spray, cleanse buttocks and bilateral posterior upper thighs. Apply triple paste.  mattress with low air loss feature.   H/o DVT -resume eliquis since surgery has been post-poned  RLS -on requip  Morbid obesity Body mass index is 42.38 kg/m.  Post nasal drip -trial of afrin   From chart review it appears patient's prednisone was stopped in April    Family Communication/Anticipated D/C date and plan/Code Status   DVT prophylaxis: restart eliquis as surgery has been postponed Code  Status: Full Code.  Family Communication: patient Disposition Plan: Status is: Inpatient  Remains inpatient appropriate because:Inpatient level of care appropriate due to severity of illness   Dispo: The patient is from: ILF?               Anticipated d/c is to: SNF              Anticipated d/c date is: 1 day             per CSW: she only has 14 days left to use for snf so she may need to have surgery before she leaves  instead of leaving and going back .          Medical Consultants:    Orthopedics  PCCM     Subjective:   C/o post nasal drip-- says clearing her throat wears her out  Objective:    Vitals:   06/19/20 0032 06/19/20 0343 06/19/20 0748 06/19/20 1110  BP: (!) 123/48 (!) 126/49    Pulse: 79 78    Resp: (!) 23 20    Temp:  98.9 F (37.2 C) 97.8 F (36.6 C) 98.8 F (37.1 C)  TempSrc:  Oral Oral Oral  SpO2: 97% 99%    Weight:  98.4 kg    Height:        Intake/Output Summary (Last 24 hours) at 06/19/2020 1533 Last data filed at 06/19/2020 1308 Gross per 24 hour  Intake 220 ml  Output 400 ml  Net -180 ml   Filed Weights   06/18/20 1200 06/18/20 1525 06/19/20 0343  Weight: 95.7 kg 96.2 kg 98.4 kg    Exam:  General: Appearance:    Severely obese female in no acute distress     Lungs:     respirations unlabored, no wheezing, diminished due to size  Heart:    Normal heart rate.   MS:   All extremities are intact.   Neurologic:   Awake and interactive today     Data Reviewed:   I have personally reviewed following labs and imaging studies:  Labs: Labs show the following:   Basic Metabolic Panel: Recent Labs  Lab 06/14/20 1634 06/14/20 1634 06/16/20 0212 06/16/20 0212 06/17/20 1158 06/17/20 1158 06/17/20 1528 06/18/20 0703  NA 143  --  140  --  136  --  138 139  K 4.8   < > 4.3   < > 4.7   < > 4.8 4.7  CL 99  --  99  --  98  --  98 99  CO2 28  --  29  --   --   --  31 29  GLUCOSE 132*  --  204*  --  107*  --  109* 91  BUN 71*  --  81*  --  42*  --  39* 51*  CREATININE 4.81*  --  5.06*  --  3.70*  --  4.01* 4.74*  CALCIUM 8.4*  --  8.2*  --   --   --  8.7* 8.6*  PHOS  --   --   --   --   --   --   --  6.3*   < > = values in this interval not displayed.   GFR Estimated Creatinine Clearance: 11 mL/min (A) (by C-G formula based on SCr of 4.74 mg/dL (H)). Liver Function Tests: Recent Labs  Lab 06/14/20 1634 06/18/20 0703  AST 26  --   ALT  25  --  ALKPHOS 91  --   BILITOT 0.9  --   PROT 5.6*  --   ALBUMIN 2.9* 2.3*   No results for input(s): LIPASE, AMYLASE in the last 168 hours. No results for input(s): AMMONIA in the last 168 hours. Coagulation profile No results for input(s): INR, PROTIME in the last 168 hours.  CBC: Recent Labs  Lab 06/14/20 1634 06/16/20 0212 06/17/20 1158 06/17/20 1528 06/18/20 0703  WBC 10.4 10.0  --  11.2* 8.1  NEUTROABS 9.0*  --   --   --   --   HGB 11.1* 9.9* 11.6* 9.9* 9.7*  HCT 37.3 33.0* 34.0* 34.3* 33.1*  MCV 101.6* 101.2*  --  103.9* 102.8*  PLT 239 215  --  204 188   Cardiac Enzymes: No results for input(s): CKTOTAL, CKMB, CKMBINDEX, TROPONINI in the last 168 hours. BNP (last 3 results) No results for input(s): PROBNP in the last 8760 hours. CBG: Recent Labs  Lab 06/18/20 1152 06/18/20 1559 06/18/20 2130 06/19/20 0608 06/19/20 1108  GLUCAP 103* 82 180* 120* 157*   D-Dimer: No results for input(s): DDIMER in the last 72 hours. Hgb A1c: No results for input(s): HGBA1C in the last 72 hours. Lipid Profile: No results for input(s): CHOL, HDL, LDLCALC, TRIG, CHOLHDL, LDLDIRECT in the last 72 hours. Thyroid function studies: No results for input(s): TSH, T4TOTAL, T3FREE, THYROIDAB in the last 72 hours.  Invalid input(s): FREET3 Anemia work up: No results for input(s): VITAMINB12, FOLATE, FERRITIN, TIBC, IRON, RETICCTPCT in the last 72 hours. Sepsis Labs: Recent Labs  Lab 06/14/20 1634 06/16/20 0212 06/17/20 1528 06/18/20 0703  WBC 10.4 10.0 11.2* 8.1    Microbiology Recent Results (from the past 240 hour(s))  SARS Coronavirus 2 by RT PCR (hospital order, performed in [facility] hospital lab) Nasopharyngeal Nasopharyngeal Swab     Status: None   Collection Time: 06/14/20  4:35 PM   Specimen: Nasopharyngeal Swab  Result Value Ref Range Status   SARS Coronavirus 2 NEGATIVE NEGATIVE Final    Comment: (NOTE) SARS-CoV-2 target nucleic acids are NOT  DETECTED.  The SARS-CoV-2 RNA is generally detectable in upper and lower respiratory specimens during the acute phase of infection. The lowest concentration of SARS-CoV-2 viral copies this assay can detect is 250 copies / mL. A negative result does not preclude SARS-CoV-2 infection and should not be used as the sole basis for treatment or other patient management decisions.  A negative result may occur with improper specimen collection / handling, submission of specimen other than nasopharyngeal swab, presence of viral mutation(s) within the areas targeted by this assay, and inadequate number of viral copies (<250 copies / mL). A negative result must be combined with clinical observations, patient history, and epidemiological information.  Fact Sheet for Patients:   StrictlyIdeas.no  Fact Sheet for Healthcare Providers: BankingDealers.co.za  This test is not yet approved or  cleared by the [place] FDA and has been authorized for detection and/or diagnosis of SARS-CoV-2 by FDA under an Emergency Use Authorization (EUA).  This EUA will remain in effect (meaning this test can be used) for the duration of the COVID-19 declaration under Section 564(b)(1) of the Act, 21 U.S.C. section 360bbb-3(b)(1), unless the authorization is terminated or revoked sooner.  Performed at [facility], [place] [address] [place], [place] [address]   Surgical pcr screen     Status: None   Collection Time: 06/14/20 10:50 PM   Specimen: Nasal Mucosa; Nasal Swab  Result Value Ref Range  Status   MRSA, PCR NEGATIVE NEGATIVE Final   Staphylococcus aureus NEGATIVE NEGATIVE Final    Comment: (NOTE) The Xpert SA Assay (FDA approved for NASAL specimens in patients 76 years of age and older), is one component of a comprehensive surveillance program. It is not intended to diagnose infection nor to guide or monitor treatment. Performed at [facility], [place] [address], [place],  [address]     Procedures and diagnostic studies:  DG CHEST PORT 1 VIEW  Result Date: 06/17/2020 CLINICAL DATA:  Respiratory distress EXAM: PORTABLE CHEST 1 VIEW COMPARISON:  06/15/2020 FINDINGS: Single frontal view of the chest demonstrates an unremarkable cardiac silhouette. Mild diffuse interstitial prominence is unchanged, compatible with scarring. No focal consolidation, effusion, or pneumothorax. Stable anterior subluxation or dislocation of the bilateral glenohumeral joints. IMPRESSION: 1. Stable exam, no acute process. Electronically Signed   By: Randa Ngo M.D.   On: 06/17/2020 15:47    Medications:   . apixaban  2.5 mg Oral BID  . calcium acetate  667 mg Oral TID with meals  . carbidopa-levodopa  1 tablet Oral BID  . Chlorhexidine Gluconate Cloth  6 each Topical Q0600  . colchicine  0.6 mg Oral Daily  . darbepoetin (ARANESP) injection - DIALYSIS  40 mcg Intravenous Q Wed-HD  . febuxostat  40 mg Oral Daily  . gabapentin  300 mg Oral QHS  . insulin aspart  0-9 Units Subcutaneous TID WC  . insulin NPH Human  10 Units Subcutaneous BID AC & HS  . multivitamin  1 tablet Oral Daily  . oxymetazoline  1 spray Each Nare BID  . pantoprazole  40 mg Oral Daily  . polyethylene glycol  17 g Oral Daily  . rOPINIRole  4 mg Oral BID  . Zinc Oxide   Topical QID   Continuous Infusions:   LOS: 5 days   Geradine Girt  [facility]   How to contact the [facility] Attending or Consulting provider [place] or covering provider during after hours [place], for this patient?  1. Check the care team in [facility] and look for a) attending/consulting [facility] provider listed and b) the [facility] team listed 2. Log into [email or web address] and use [place]'s universal password to access. If you do not have the password, please contact the hospital operator. 3. Locate the [facility] provider you are looking for under [facility] and page to a number that you can be  directly reached. 4. If you still have difficulty reaching the provider, please page the [facility] (Director on Call) for the Hospitalists listed on amion for assistance.  06/19/2020, 3:33 PM

## 2020-06-19 NOTE — Progress Notes (Signed)
Patient placed on the BIPAP she is tolerating it well at this moment. Vital signs stable.

## 2020-06-19 NOTE — Consult Note (Signed)
   [facility] Inpatient Consult   06/19/2020  Jean Mcclain [birth year] [id number]   Active Patient: [employer] CSNP  Patient has an active RN Chronic Care Coordinator in the [facility] CSNP that is following for disease management for diabetes, notes some difficulty maintaining contact, and post hospital follow up. Patient is from an [facility] and now recommended for SNF by PT and OT staff.  Patient is also active with [facility] for TOC needs.  Plan: Will alert [facility] RN Chronic Care Coordinator of any post hospital transition of care needs.  For questions, please contact:  Natividad Brood, RN BSN [place] [facility] Liaison  [phone] business mobile phone Toll free office [phone]  Fax number: [phone] [name]

## 2020-06-19 NOTE — Progress Notes (Signed)
[facility] Progress Note   Subjective: seen in room, on nasal O2  Objective Vitals:   06/19/20 0027 06/19/20 0032 06/19/20 0343 06/19/20 0748  BP: (!) 123/48 (!) 123/48 (!) 126/49   Pulse: 81 79 78   Resp: 17 (!) 23 20   Temp: 99.2 F (37.3 C)  98.9 F (37.2 C) 97.8 F (36.6 C)  TempSrc: Oral  Oral Oral  SpO2: 92% 97% 99%   Weight:   98.4 kg   Height:       Physical Exam General: obese WF, awake, chron ill appearing Heart: RRR, no murmurs, rubs or gallops Lungs: CTA bilaterally without wheezing, rhonchi or rales Abdomen: Soft, non-tender, non-distended, +BS Extremities: Trace pitting edema bilateral lower extremities Dialysis Access: RUE AVF accessed  Dialysis: [facility] MWF  4h 66min  90.5kg  2/2 bath  AVF  15g  Hep 3000 Hectorol 2 mcg IV q HD  CXR 9/08 - IMPRESSION: 1. Stable exam, no acute process.   Assessment/Plan: 1. 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 Sharol Given, planned for tibial talar calcaneal fusion but pt declined so surgery is postponed.  2. A/C resp failure w/ hypercapnia d/t OSA/ OHS: sp BiPap, CXR negative. Better. Off bipap.  3.  ESRD: usual HD is MWF.  HD Sat then resume MWF.  4.  BP/ volume:  Wt's variable, CXR neg. UF 2 L w hd sat as tol 5.  Anemia: Hgb 9.9. Will plan to start aranesp next HD 6. Hx of DVT: eliquis resumed since surgery on hold 7.  Metabolic bone disease: Corrected calcium at goal. Continue hectorol. Continue calcium acetate and follow phos level.  8.  Nutrition:  Renal/carb modified diet.  9. T2DM: On insulin, per primary team  Kelly Splinter, MD 06/18/2020, 3:26 PM  Additional Objective Labs: Basic Metabolic Panel: Recent Labs  Lab 06/16/20 0212 06/16/20 0212 06/17/20 1158 06/17/20 1528 06/18/20 0703  NA 140   < > 136 138 139  K 4.3   < > 4.7 4.8 4.7  CL 99   < > 98 98 99  CO2 29  --   --  31 29  GLUCOSE 204*   < > 107* 109* 91  BUN 81*   < > 42* 39* 51*   CREATININE 5.06*   < > 3.70* 4.01* 4.74*  CALCIUM 8.2*  --   --  8.7* 8.6*  PHOS  --   --   --   --  6.3*   < > = values in this interval not displayed.   Liver Function Tests: Recent Labs  Lab 06/14/20 1634 06/18/20 0703  AST 26  --   ALT 25  --   ALKPHOS 91  --   BILITOT 0.9  --   PROT 5.6*  --   ALBUMIN 2.9* 2.3*   CBC: Recent Labs  Lab 06/14/20 1634 06/14/20 1634 06/16/20 0212 06/16/20 0212 06/17/20 1158 06/17/20 1528 06/18/20 0703  WBC 10.4   < > 10.0  --   --  11.2* 8.1  NEUTROABS 9.0*  --   --   --   --   --   --   HGB 11.1*   < > 9.9*   < > 11.6* 9.9* 9.7*  HCT 37.3   < > 33.0*   < > 34.0* 34.3* 33.1*  MCV 101.6*  --  101.2*  --   --  103.9* 102.8*  PLT 239   < > 215  --   --  204 188   < > = values in this interval not displayed.   Blood Culture    Component Value Date/Time   SDES FLUID LEFT PLEURAL 04/06/2017 1248   SDES FLUID LEFT PLEURAL 04/06/2017 1248   SPECREQUEST NONE 04/06/2017 1248   SPECREQUEST NONE 04/06/2017 1248   CULT NO GROWTH 5 DAYS 04/06/2017 1248   REPTSTATUS 04/11/2017 FINAL 04/06/2017 1248   REPTSTATUS 04/08/2017 FINAL 04/06/2017 1248    CBG: Recent Labs  Lab 06/18/20 0649 06/18/20 1152 06/18/20 1559 06/18/20 2130 06/19/20 0608  GLUCAP 100* 103* 82 180* 120*    Studies/Results: DG CHEST PORT 1 VIEW  Result Date: 06/17/2020 CLINICAL DATA:  Respiratory distress EXAM: PORTABLE CHEST 1 VIEW COMPARISON:  06/15/2020 FINDINGS: Single frontal view of the chest demonstrates an unremarkable cardiac silhouette. Mild diffuse interstitial prominence is unchanged, compatible with scarring. No focal consolidation, effusion, or pneumothorax. Stable anterior subluxation or dislocation of the bilateral glenohumeral joints. IMPRESSION: 1. Stable exam, no acute process. Electronically Signed   By: Randa Ngo M.D.   On: 06/17/2020 15:47   Medications:  . apixaban  2.5 mg Oral BID  . calcium acetate  667 mg Oral TID with meals  .  carbidopa-levodopa  1 tablet Oral BID  . Chlorhexidine Gluconate Cloth  6 each Topical Q0600  . colchicine  0.6 mg Oral Daily  . darbepoetin (ARANESP) injection - DIALYSIS  40 mcg Intravenous Q Wed-HD  . febuxostat  40 mg Oral Daily  . gabapentin  300 mg Oral QHS  . insulin aspart  0-9 Units Subcutaneous TID WC  . insulin NPH Human  10 Units Subcutaneous BID AC & HS  . multivitamin  1 tablet Oral Daily  . oxymetazoline  1 spray Each Nare BID  . pantoprazole  40 mg Oral Daily  . polyethylene glycol  17 g Oral Daily  . rOPINIRole  4 mg Oral BID  . Zinc Oxide   Topical QID

## 2020-06-19 NOTE — Telephone Encounter (Signed)
Patient still admitted to the hospital - scheduled hospital f/u with Wyn Quaker, NP on 06/26/2020 -pr

## 2020-06-19 NOTE — Social Work (Addendum)
CSW spoke with [facility] @[place] , confirmed that pt is ok to return to ind living, will transition to SNF once surgery is complete. Pt stated she is not ready to leave as she needs help going to the bathroom and getting out of bed and she has no one to help her. After speaking with the Dr, she agreed not to dc pt at this time. Updated pt and pt's brother.

## 2020-06-19 NOTE — Care Management Important Message (Signed)
Important Message  Patient Details  Name: AKIRA PERUSSE MRN: [id number] Date of Birth: 07/25/1946   Medicare Important Message Given:  Yes     Shelda Altes 06/19/2020, 10:44 AM

## 2020-06-19 NOTE — Progress Notes (Signed)
Physical Therapy Treatment Patient Details Name: Jean Mcclain MRN: [id number] DOB: 24-Mar-1946 Today's Date: 06/19/2020    History of Present Illness 74 year old female with history of prior DVT on Eliquis, ESRD on HD MWF, IDDM, morbid obesity with a BMI of 40, OSA not on CPAP, hypertension, hyperlipidemia, GERD, nightly oxygen (2L) came into the hospital with worsening left ankle and foot pain with decreased mobility. This has been going on for a while, does not really recall an injury to the foot. Imaging of the left foot in the ED showed transverse mildly displaced fracture to the medial malleolus, subtalar dislocation with lateral angulation of the majority of the foot, lateral subluxation of the navicular at the talonavicular joint. Orthopedic surgery was consulted and she was admitted to the hospital.  Plan for surgery on 9/8 but patient was confused and lethargic at times    PT Comments    Pt admitted with above diagnosis. Pt was able to sit to eOB with mod assist of 2.  Pt did stand to RW with mod assist of 2 and pivot to the recliner.  Pt needs incr cues and assist to transfer to chair with flexed posture and poor sequencing of steps.   Pt currently with functional limitations due to balance and endurance deficits. Pt will benefit from skilled PT to increase their independence and safety with mobility to allow discharge to the venue listed below.     Follow Up Recommendations  SNF;Supervision/Assistance - 24 hour     Equipment Recommendations  Other (comment) (defer to next venue)    Recommendations for Other Services       Precautions / Restrictions Precautions Precautions: Fall Required Braces or Orthoses: Other Brace (CAM boot) Other Brace: CAM boot LLE Restrictions Weight Bearing Restrictions: Yes LLE Weight Bearing: Weight bearing as tolerated Other Position/Activity Restrictions: needs to be in L CAM boot for WBAT    Mobility  Bed Mobility Overal bed mobility: Needs  Assistance Bed Mobility: Supine to Sit     Supine to sit: Mod assist;+2 for physical assistance;HOB elevated     General bed mobility comments: Donned CAM boot left LE with total assist. Pt needed mod assist of 2 to sit up from supine as she brought LEs off of bed however could not roll and reach for rail to push up or pull up to sit to EOB unless assisted moderately.   Transfers Overall transfer level: Needs assistance Equipment used: Rolling walker (2 wheeled) Transfers: Sit to/from [facility] Sit to Stand: Min assist;Mod assist;+2 physical assistance;From elevated surface Stand pivot transfers: Min assist;+2 safety/equipment;From elevated surface       General transfer comment: Took incr time to scoot pt out to EOB and get her right LE onto floor.  Pt was able to stand up to the RW with mod assist to power up but once up needing min assist to sequence steps and RW and take pivotal steps to chair. Initially had to move pts left LE for her but she eventually was able to move it around.  Pt was fatiguing therefore brought chair to pt for her to sit down.    Ambulation/Gait             General Gait Details: unable to assess   Stairs             Wheelchair Mobility    Modified Rankin (Stroke Patients Only)       Balance Overall balance assessment: Needs assistance Sitting-balance support: No upper  extremity supported;Feet supported Sitting balance-Leahy Scale: Fair Sitting balance - Comments: can sit statically without UE support    Standing balance support: Bilateral upper extremity supported;During functional activity Standing balance-Leahy Scale: Poor Standing balance comment: relies on UE support for balance on RW and external support                            Cognition Arousal/Alertness: Awake/alert Behavior During Therapy: WFL for tasks assessed/performed Overall Cognitive Status: Impaired/Different from baseline Area of  Impairment: Safety/judgement;Problem solving;Following commands                       Following Commands: Follows one step commands inconsistently;Follows one step commands with increased time Safety/Judgement: Decreased awareness of safety;Decreased awareness of deficits   Problem Solving: Slow processing;Decreased initiation;Difficulty sequencing;Requires verbal cues;Requires tactile cues General Comments: Pt alert today but did not remember therapy coming in yesterday. Pt was able to answer some questions and is oriented.  Needs cues for safety.       Exercises General Exercises - Lower Extremity Ankle Circles/Pumps: AROM;Right;10 reps;Supine Long Arc Quad: AROM;Both;10 reps;Seated Hip Flexion/Marching: AROM;Both;10 reps;Seated    General Comments General comments (skin integrity, edema, etc.): 82-101 bpm, O2 dropped to 86% on 2L with activity, 91% at rest; 105/47 initail and 126/46 at end of treatment      Pertinent Vitals/Pain Pain Assessment: Faces Faces Pain Scale: Hurts even more Pain Location: left LE Pain Descriptors / Indicators: Aching;Grimacing;Guarding Pain Intervention(s): Limited activity within patient's tolerance;Monitored during session;Repositioned    Home Living                      Prior Function            PT Goals (current goals can now be found in the care plan section) Acute Rehab PT Goals Patient Stated Goal: did not state Progress towards PT goals: Progressing toward goals    Frequency    Min 2X/week      PT Plan Current plan remains appropriate    Co-evaluation              AM-PAC PT "6 Clicks" Mobility   Outcome Measure  Help needed turning from your back to your side while in a flat bed without using bedrails?: A Lot Help needed moving from lying on your back to sitting on the side of a flat bed without using bedrails?: A Lot Help needed moving to and from a bed to a chair (including a wheelchair)?: A  Lot Help needed standing up from a chair using your arms (e.g., wheelchair or bedside chair)?: A Lot Help needed to walk in hospital room?: Total Help needed climbing 3-5 steps with a railing? : Total 6 Click Score: 10    End of Session Equipment Utilized During Treatment: Other (comment);Gait belt (CAM boot L LE) Activity Tolerance: Patient limited by fatigue Patient left: with call bell/phone within reach;in chair;with chair alarm set Nurse Communication: Mobility status PT Visit Diagnosis: Unsteadiness on feet (R26.81);Difficulty in walking, not elsewhere classified (R26.2);Muscle weakness (generalized) (M62.81);Pain Pain - Right/Left: Left Pain - part of body: Leg     Time: 9833-8250 PT Time Calculation (min) (ACUTE ONLY): 25 min  Charges:  $Therapeutic Exercise: 8-22 mins $Therapeutic Activity: 8-22 mins                     Tera Pellicane W,PT Acute Rehabilitation Services Pager:  [phone]  Office:  [place] 06/19/2020, 12:49 PM

## 2020-06-20 ENCOUNTER — Inpatient Hospital Stay (HOSPITAL_COMMUNITY): Payer: HMO

## 2020-06-20 LAB — RENAL FUNCTION PANEL
Albumin: 2.6 g/dL — ABNORMAL LOW (ref 3.5–5.0)
Anion gap: 9 (ref 5–15)
BUN: 40 mg/dL — ABNORMAL HIGH (ref 8–23)
CO2: 30 mmol/L (ref 22–32)
Calcium: 9 mg/dL (ref 8.9–10.3)
Chloride: 101 mmol/L (ref 98–111)
Creatinine, Ser: 4.55 mg/dL — ABNORMAL HIGH (ref 0.44–1.00)
GFR calc Af Amer: 10 mL/min — ABNORMAL LOW (ref >60)
GFR calc non Af Amer: 9 mL/min — ABNORMAL LOW (ref >60)
Glucose, Bld: 113 mg/dL — ABNORMAL HIGH (ref 70–99)
Phosphorus: 4.2 mg/dL (ref 2.5–4.6)
Potassium: 4 mmol/L (ref 3.5–5.1)
Sodium: 140 mmol/L (ref 135–145)

## 2020-06-20 LAB — GLUCOSE, CAPILLARY
Glucose-Capillary: 110 mg/dL — ABNORMAL HIGH (ref 70–99)
Glucose-Capillary: 105 mg/dL — ABNORMAL HIGH (ref 70–99)
Glucose-Capillary: 192 mg/dL — ABNORMAL HIGH (ref 70–99)
Glucose-Capillary: 165 mg/dL — ABNORMAL HIGH (ref 70–99)

## 2020-06-20 LAB — CBC
HCT: 32.4 % — ABNORMAL LOW (ref 36.0–46.0)
Hemoglobin: 9.7 g/dL — ABNORMAL LOW (ref 12.0–15.0)
MCH: 30.4 pg (ref 26.0–34.0)
MCHC: 29.9 g/dL — ABNORMAL LOW (ref 30.0–36.0)
MCV: 101.6 fL — ABNORMAL HIGH (ref 80.0–100.0)
Platelets: 210 10*3/uL (ref 150–400)
RBC: 3.19 MIL/uL — ABNORMAL LOW (ref 3.87–5.11)
RDW: 14.8 % (ref 11.5–15.5)
WBC: 8.3 10*3/uL (ref 4.0–10.5)
nRBC: 0 % (ref 0.0–0.2)

## 2020-06-20 MED ORDER — HEPARIN SODIUM (PORCINE) 1000 UNIT/ML IJ SOLN
INTRAMUSCULAR | Status: AC
  Administered 2020-06-20: 3000 [IU]
  Filled 2020-06-20: qty 3

## 2020-06-20 MED ORDER — SALINE SPRAY 0.65 % NA SOLN
1.0000 | NASAL | Status: DC | PRN
  Administered 2020-06-20 – 2020-06-23 (×3): 1 via NASAL
  Filled 2020-06-20: qty 44

## 2020-06-20 MED ORDER — COLCHICINE 0.6 MG PO TABS
0.3000 mg | ORAL_TABLET | Freq: Every day | ORAL | Status: DC
  Administered 2020-06-21 – 2020-06-27 (×6): 0.3 mg via ORAL
  Filled 2020-06-20 (×8): qty 0.5

## 2020-06-20 MED ORDER — MUSCLE RUB 10-15 % EX CREA
TOPICAL_CREAM | CUTANEOUS | Status: DC | PRN
  Filled 2020-06-20: qty 85

## 2020-06-20 MED ORDER — ACETAMINOPHEN 650 MG RE SUPP
325.0000 mg | Freq: Once | RECTAL | Status: AC
  Administered 2020-06-20: 325 mg via RECTAL
  Filled 2020-06-20: qty 1

## 2020-06-20 NOTE — Progress Notes (Signed)
Pt refusing BIpap at this time

## 2020-06-20 NOTE — Progress Notes (Signed)
   06/20/20 0100  Assess: MEWS Score  Temp (!) 100.5 F (38.1 C)  BP (!) 114/52  Pulse Rate 82  ECG Heart Rate 82  Resp (!) 21  SpO2 92 %  O2 Device Bi-PAP  Assess: MEWS Score  MEWS Temp 1  MEWS Systolic 0  MEWS Pulse 0  MEWS RR 1  MEWS LOC 0  MEWS Score 2  MEWS Score Color Yellow  Assess: if the MEWS score is Yellow or Red  Were vital signs taken at a resting state? Yes  Focused Assessment No change from prior assessment  Early Detection of Sepsis Score *See Row Information* Medium  MEWS guidelines implemented *See Row Information* Yes  Treat  MEWS Interventions Administered prn meds/treatments  Pain Scale 0-10  Pain Score 0  Escalate  MEWS: Escalate Yellow: discuss with charge nurse/RN and consider discussing with provider and RRT  Notify: Charge Nurse/RN  Name of Charge Nurse/RN Notified Danae Chen, RN  Date Charge Nurse/RN Notified 06/19/20  Time Charge Nurse/RN Notified 2300  Notify: Provider  Provider Name/Title Dr. Konrad Felix  Date Provider Notified 06/20/20  Time Provider Notified 0045  Notification Type Page  Notification Reason Other (Comment) (Elevated Temp)  Response See new orders  Date of Provider Response 06/20/20  Time of Provider Response 0125  Document  Patient Outcome Stabilized after interventions

## 2020-06-20 NOTE — Progress Notes (Signed)
[facility] Progress Note   Subjective: seen on HD, BP's good during HD this am and 3 L removed.   Objective Vitals:   06/20/20 0930 06/20/20 1000 06/20/20 1030 06/20/20 1054  BP: (!) 110/56 (!) 111/59 (!) 101/54 (!) 118/55  Pulse: 64 68 74 64  Resp: 19 19 (!) 22 (!) 21  Temp:      TempSrc:      SpO2:    100%  Weight:      Height:       Physical Exam General: obese WF, awake, chron ill appearing Heart: RRR, no murmurs, rubs or gallops Lungs: CTA bilaterally without wheezing, rhonchi or rales Abdomen: Soft, non-tender, non-distended, +BS Extremities: Trace pitting edema bilateral lower extremities Dialysis Access: RUE AVF accessed  Dialysis: [facility] MWF  4h 52min  90.5kg  2/2 bath  AVF  15g  Hep 3000 Hectorol 2 mcg IV q HD  CXR 9/08 - IMPRESSION: 1. Stable exam, no acute process.   Assessment/Plan: 1. 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 Sharol Given, planned for tibial talar calcaneal fusion but pt's resp status declined so surgery was postponed.  2. A/C resp failure w/ hypercapnia d/t OSA/ OHS: sp BiPap, CXR negative. Better, off bipap.  3.  ESRD: usual HD is MWF.  HD today then resume MWF.  4.  BP/ volume:  Wt's variable, CXR neg. Should be close to dry after 3 L UF this am.  5.  Anemia: Hgb 9.9, we started esa here 40 ug on 9/09 6. Hx of DVT: eliquis resumed since surgery on hold 7.  Metabolic bone disease: Corrected calcium at goal. Continue hectorol. Continue calcium acetate and follow phos level.  8.  Nutrition:  Renal/carb modified diet.  9. T2DM: On insulin, per primary team  Kelly Splinter, MD 06/18/2020, 3:26 PM  Additional Objective Labs: Basic Metabolic Panel: Recent Labs  Lab 06/17/20 1528 06/18/20 0703 06/20/20 0744  NA 138 139 140  K 4.8 4.7 4.0  CL 98 99 101  CO2 31 29 30   GLUCOSE 109* 91 113*  BUN 39* 51* 40*  CREATININE 4.01* 4.74* 4.55*  CALCIUM 8.7* 8.6* 9.0  PHOS  --  6.3* 4.2    Liver Function Tests: Recent Labs  Lab 06/14/20 1634 06/18/20 0703 06/20/20 0744  AST 26  --   --   ALT 25  --   --   ALKPHOS 91  --   --   BILITOT 0.9  --   --   PROT 5.6*  --   --   ALBUMIN 2.9* 2.3* 2.6*   CBC: Recent Labs  Lab 06/14/20 1634 06/14/20 1634 06/16/20 0212 06/17/20 1158 06/17/20 1528 06/18/20 0703 06/20/20 0744  WBC 10.4   < > 10.0  --  11.2* 8.1 8.3  NEUTROABS 9.0*  --   --   --   --   --   --   HGB 11.1*   < > 9.9*   < > 9.9* 9.7* 9.7*  HCT 37.3   < > 33.0*   < > 34.3* 33.1* 32.4*  MCV 101.6*  --  101.2*  --  103.9* 102.8* 101.6*  PLT 239   < > 215  --  204 188 210   < > = values in this interval not displayed.   Blood Culture    Component Value Date/Time   SDES FLUID LEFT PLEURAL 04/06/2017 1248   SDES FLUID LEFT PLEURAL 04/06/2017 1248  SPECREQUEST NONE 04/06/2017 1248   SPECREQUEST NONE 04/06/2017 1248   CULT NO GROWTH 5 DAYS 04/06/2017 1248   REPTSTATUS 04/11/2017 FINAL 04/06/2017 1248   REPTSTATUS 04/08/2017 FINAL 04/06/2017 1248    CBG: Recent Labs  Lab 06/19/20 1108 06/19/20 1610 06/19/20 2139 06/20/20 0629 06/20/20 1130  GLUCAP 157* 162* 139* 110* 105*    Studies/Results: No results found. Medications:  . apixaban  2.5 mg Oral BID  . calcium acetate  667 mg Oral TID with meals  . carbidopa-levodopa  1 tablet Oral BID  . Chlorhexidine Gluconate Cloth  6 each Topical Q0600  . colchicine  0.6 mg Oral Daily  . darbepoetin (ARANESP) injection - DIALYSIS  40 mcg Intravenous Q Wed-HD  . febuxostat  40 mg Oral Daily  . gabapentin  300 mg Oral QHS  . insulin aspart  0-9 Units Subcutaneous TID WC  . insulin NPH Human  10 Units Subcutaneous BID AC & HS  . multivitamin  1 tablet Oral Daily  . oxymetazoline  1 spray Each Nare BID  . pantoprazole  40 mg Oral Daily  . polyethylene glycol  17 g Oral Daily  . rOPINIRole  4 mg Oral BID  . Zinc Oxide   Topical QID

## 2020-06-20 NOTE — Procedures (Signed)
   I was present at this dialysis session, have reviewed the session itself and made  appropriate changes Kelly Splinter MD [place] pager [phone]   06/20/2020, 12:34 PM

## 2020-06-20 NOTE — Progress Notes (Signed)
Progress Note    Jean Mcclain  YQI:[id number] DOB: 1945-11-08  DOA: 06/14/2020 PCP: Leanna Battles, MD    Brief Narrative:    Medical records reviewed and are as summarized below: 74 year old female with history of prior DVT on Eliquis, ESRD on HD MWF, IDDM, morbid obesity with a BMI of 40, OSA not on CPAP, hypertension, hyperlipidemia, GERD, nightly oxygen (2L) came into the hospital with worsening left ankle and foot pain with decreased mobility. This has been going on for a while, does not really recall an injury to the foot. Imaging of the left foot in the ED showed transverse mildly displaced fracture to the medial malleolus, subtalar dislocation with lateral angulation of the majority of the foot, lateral subluxation of the navicular at the talonavicular joint. Orthopedic surgery was consulted and she was admitted to the hospital.  Plan for surgery on 9/8 but patient was confused and lethargic at the time.  So surgery was delayed. Per CSW: she only has 14 days left to use for snf so she may need to have surgery before she leaves instead of leaving and going back .    Assessment/Plan:   Principal Problem:   Ankle fracture Active Problems:   Insulin-requiring or dependent type II diabetes mellitus ([place])   Essential hypertension   Pressure injury of skin   End stage renal disease (HCC)   HLD (hyperlipidemia)   Primary osteoarthritis, left ankle and foot   Left ankle fracture with deformity -orthopedics consulted -planned for surgery on 9/8 but patient had hypercapnic resp failure -now plan is for surgery next week: Anticipate patient will need discharge to skilled nursing.  Orders are placed for a fracture boot weightbearing as tolerated on the left lower extremity.  We will plan to follow-up in the office after discharge to arrange for surgical intervention.  If patient is still hospitalized and stable next Wednesday we could proceed with surgery next Wednesday. -per CSW:  she only has 14 days left to use for snf so she may need to have surgery before she leaves instead of leaving and going back    Acute on Chronic respiratory failure with hypercapnia due to  OSA -on 2L [place] at night -DOES NOT NEED TO BE 100%- prefer sats between 88-90% -improved on bipap- does not tolerate well but did wear for a time period last PM  Type 2 DM -SSI -resume NPH at a lower dose  ESRD -on HD -nephrology consult appreciated  Chronic disease -Transfuse for hemoglobin less 7  Pressure ulcer- POA Pressure Injury 06/14/20 Buttocks Right;Posterior Stage 2 -  Partial thickness loss of dermis presenting as a shallow open injury with a red, pink wound bed without slough. (Active)  06/14/20 2200  Location: Buttocks  Location Orientation: Right;Posterior  Staging: Stage 2 -  Partial thickness loss of dermis presenting as a shallow open injury with a red, pink wound bed without slough.  Wound Description (Comments):   Present on Admission: Yes  -WOC consult: Using no rinse spray, cleanse buttocks and bilateral posterior upper thighs. Apply triple paste.  mattress with low air loss feature.   H/o DVT -resume eliquis since surgery has been post-poned  RLS -on requip  Morbid obesity Body mass index is 40.47 kg/m.  Post nasal drip -trial of afrin  Fever -? Aspiration pna on right -await official x ray read Start abx if another fever  From chart review it appears patient's prednisone was stopped in April    Family Communication/Anticipated  D/C date and plan/Code Status   DVT prophylaxis: restart eliquis as surgery has been postponed Code Status: Full Code.  Family Communication: spoke with brother Disposition Plan: Status is: Inpatient  Remains inpatient appropriate because:Inpatient level of care appropriate due to severity of illness   Dispo: The patient is from: ILF?               Anticipated d/c is to: SNF              Anticipated d/c date is: after  surgery             per CSW: she only has 14 days left to use for snf so she may need to have surgery before she leaves instead of leaving and going back .          Medical Consultants:    Orthopedics  PCCM     Subjective:   Just back from HD  Objective:    Vitals:   06/20/20 0930 06/20/20 1000 06/20/20 1030 06/20/20 1054  BP: (!) 110/56 (!) 111/59 (!) 101/54 (!) 118/55  Pulse: 64 68 74 64  Resp: 19 19 (!) 22 (!) 21  Temp:      TempSrc:      SpO2:    100%  Weight:      Height:        Intake/Output Summary (Last 24 hours) at 06/20/2020 1509 Last data filed at 06/20/2020 1054 Gross per 24 hour  Intake --  Output 3300 ml  Net -3300 ml   Filed Weights   06/19/20 0343 06/20/20 0400 06/20/20 0709  Weight: 98.4 kg 95.7 kg 94 kg    Exam:   General: Appearance:    Severely obese female in no acute distress     Lungs:     respirations unlabored, diminished due to size, no wheezing  Heart:    Normal heart rate. Normal rhythm. No murmurs, rubs, or gallops.      Neurologic:   Awake, alert, oriented x 3. No apparent focal neurological           defect.      Data Reviewed:   I have personally reviewed following labs and imaging studies:  Labs: Labs show the following:   Basic Metabolic Panel: Recent Labs  Lab 06/14/20 1634 06/14/20 1634 06/16/20 0212 06/16/20 0212 06/17/20 1158 06/17/20 1158 06/17/20 1528 06/17/20 1528 06/18/20 0703 06/20/20 0744  NA 143   < > 140  --  136  --  138  --  139 140  K 4.8   < > 4.3   < > 4.7   < > 4.8   < > 4.7 4.0  CL 99   < > 99  --  98  --  98  --  99 101  CO2 28  --  29  --   --   --  31  --  29 30  GLUCOSE 132*   < > 204*  --  107*  --  109*  --  91 113*  BUN 71*   < > 81*  --  42*  --  39*  --  51* 40*  CREATININE 4.81*   < > 5.06*  --  3.70*  --  4.01*  --  4.74* 4.55*  CALCIUM 8.4*  --  8.2*  --   --   --  8.7*  --  8.6* 9.0  PHOS  --   --   --   --   --   --   --   --  6.3* 4.2   < > = values in this  interval not displayed.   GFR Estimated Creatinine Clearance: 11.1 mL/min (A) (by C-G formula based on SCr of 4.55 mg/dL (H)). Liver Function Tests: Recent Labs  Lab 06/14/20 1634 06/18/20 0703 06/20/20 0744  AST 26  --   --   ALT 25  --   --   ALKPHOS 91  --   --   BILITOT 0.9  --   --   PROT 5.6*  --   --   ALBUMIN 2.9* 2.3* 2.6*   No results for input(s): LIPASE, AMYLASE in the last 168 hours. No results for input(s): AMMONIA in the last 168 hours. Coagulation profile No results for input(s): INR, PROTIME in the last 168 hours.  CBC: Recent Labs  Lab 06/14/20 1634 06/14/20 1634 06/16/20 0212 06/17/20 1158 06/17/20 1528 06/18/20 0703 06/20/20 0744  WBC 10.4  --  10.0  --  11.2* 8.1 8.3  NEUTROABS 9.0*  --   --   --   --   --   --   HGB 11.1*   < > 9.9* 11.6* 9.9* 9.7* 9.7*  HCT 37.3   < > 33.0* 34.0* 34.3* 33.1* 32.4*  MCV 101.6*  --  101.2*  --  103.9* 102.8* 101.6*  PLT 239  --  215  --  204 188 210   < > = values in this interval not displayed.   Cardiac Enzymes: No results for input(s): CKTOTAL, CKMB, CKMBINDEX, TROPONINI in the last 168 hours. BNP (last 3 results) No results for input(s): PROBNP in the last 8760 hours. CBG: Recent Labs  Lab 06/19/20 1108 06/19/20 1610 06/19/20 2139 06/20/20 0629 06/20/20 1130  GLUCAP 157* 162* 139* 110* 105*   D-Dimer: No results for input(s): DDIMER in the last 72 hours. Hgb A1c: No results for input(s): HGBA1C in the last 72 hours. Lipid Profile: No results for input(s): CHOL, HDL, LDLCALC, TRIG, CHOLHDL, LDLDIRECT in the last 72 hours. Thyroid function studies: No results for input(s): TSH, T4TOTAL, T3FREE, THYROIDAB in the last 72 hours.  Invalid input(s): FREET3 Anemia work up: No results for input(s): VITAMINB12, FOLATE, FERRITIN, TIBC, IRON, RETICCTPCT in the last 72 hours. Sepsis Labs: Recent Labs  Lab 06/16/20 0212 06/17/20 1528 06/18/20 0703 06/20/20 0744  WBC 10.0 11.2* 8.1 8.3     Microbiology Recent Results (from the past 240 hour(s))  SARS Coronavirus 2 by RT PCR (hospital order, performed in [facility] hospital lab) Nasopharyngeal Nasopharyngeal Swab     Status: None   Collection Time: 06/14/20  4:35 PM   Specimen: Nasopharyngeal Swab  Result Value Ref Range Status   SARS Coronavirus 2 NEGATIVE NEGATIVE Final    Comment: (NOTE) SARS-CoV-2 target nucleic acids are NOT DETECTED.  The SARS-CoV-2 RNA is generally detectable in upper and lower respiratory specimens during the acute phase of infection. The lowest concentration of SARS-CoV-2 viral copies this assay can detect is 250 copies / mL. A negative result does not preclude SARS-CoV-2 infection and should not be used as the sole basis for treatment or other patient management decisions.  A negative result may occur with improper specimen collection / handling, submission of specimen other than nasopharyngeal swab, presence of viral mutation(s) within the areas targeted by this assay, and inadequate number of viral copies (<250 copies / mL). A negative result must be combined with clinical observations, patient history, and epidemiological information.  Fact Sheet for Patients:   StrictlyIdeas.no  Fact Sheet for  Healthcare Providers: BankingDealers.co.za  This test is not yet approved or  cleared by the [place] and has been authorized for detection and/or diagnosis of SARS-CoV-2 by FDA under an Emergency Use Authorization (EUA).  This EUA will remain in effect (meaning this test can be used) for the duration of the COVID-19 declaration under Section 564(b)(1) of the Act, 21 U.S.C. section 360bbb-3(b)(1), unless the authorization is terminated or revoked sooner.  Performed at [facility], [place] [address], [place], [place] [address]   Surgical pcr screen     Status: None   Collection Time: 06/14/20 10:50 PM   Specimen:  Nasal Mucosa; Nasal Swab  Result Value Ref Range Status   MRSA, PCR NEGATIVE NEGATIVE Final   Staphylococcus aureus NEGATIVE NEGATIVE Final    Comment: (NOTE) The Xpert SA Assay (FDA approved for NASAL specimens in patients 51 years of age and older), is one component of a comprehensive surveillance program. It is not intended to diagnose infection nor to guide or monitor treatment. Performed at [facility], [place] [address] [place], [place] [address]   SARS Coronavirus 2 by RT PCR (hospital order, performed in [facility] hospital lab) Nasopharyngeal Nasopharyngeal Swab     Status: None   Collection Time: 06/19/20  5:13 PM   Specimen: Nasopharyngeal Swab  Result Value Ref Range Status   SARS Coronavirus 2 NEGATIVE NEGATIVE Final    Comment: (NOTE) SARS-CoV-2 target nucleic acids are NOT DETECTED.  The SARS-CoV-2 RNA is generally detectable in upper and lower respiratory specimens during the acute phase of infection. The lowest concentration of SARS-CoV-2 viral copies this assay can detect is 250 copies / mL. A negative result does not preclude SARS-CoV-2 infection and should not be used as the sole basis for treatment or other patient management decisions.  A negative result may occur with improper specimen collection / handling, submission of specimen other than nasopharyngeal swab, presence of viral mutation(s) within the areas targeted by this assay, and inadequate number of viral copies (<250 copies / mL). A negative result must be combined with clinical observations, patient history, and epidemiological information.  Fact Sheet for Patients:   StrictlyIdeas.no  Fact Sheet for Healthcare Providers: BankingDealers.co.za  This test is not yet approved or  cleared by the [place] FDA and has been authorized for detection and/or diagnosis of SARS-CoV-2 by FDA under an Emergency Use Authorization (EUA).  This EUA will  remain in effect (meaning this test can be used) for the duration of the COVID-19 declaration under Section 564(b)(1) of the Act, 21 U.S.C. section 360bbb-3(b)(1), unless the authorization is terminated or revoked sooner.  Performed at [facility], [place] [address], [place], [place] [address]     Procedures and diagnostic studies:  DG CHEST PORT 1 VIEW  Result Date: 06/20/2020 CLINICAL DATA:  Generalized weakness. Follow-up of basilar atelectasis. EXAM: PORTABLE CHEST 1 VIEW COMPARISON:  06/17/2020 and earlier. FINDINGS: Cardiac silhouette mildly enlarged, unchanged. Mild pulmonary venous hypertension without overt edema. Increased streaky and patchy airspace opacities at the RIGHT lung base since the examination 3 days ago. Stable mild atelectasis at the LEFT lung base. Note is again made of the chronic BILATERAL shoulder subluxation/dislocation. IMPRESSION: 1. Worsening atelectasis and/or pneumonia at the RIGHT lung base. 2. Stable mild LEFT basilar atelectasis. 3. Stable mild cardiomegaly without pulmonary edema. Electronically Signed   By: Evangeline Dakin M.D.   On: 06/20/2020 15:02    Medications:   . apixaban  2.5 mg Oral BID  . calcium acetate  667 mg Oral TID with meals  . carbidopa-levodopa  1 tablet Oral BID  . Chlorhexidine Gluconate Cloth  6 each Topical Q0600  . [START ON 06/21/2020] colchicine  0.3 mg Oral Daily  . darbepoetin (ARANESP) injection - DIALYSIS  40 mcg Intravenous Q Wed-HD  . febuxostat  40 mg Oral Daily  . gabapentin  300 mg Oral QHS  . insulin aspart  0-9 Units Subcutaneous TID WC  . insulin NPH Human  10 Units Subcutaneous BID AC & HS  . multivitamin  1 tablet Oral Daily  . oxymetazoline  1 spray Each Nare BID  . pantoprazole  40 mg Oral Daily  . polyethylene glycol  17 g Oral Daily  . rOPINIRole  4 mg Oral BID  . Zinc Oxide   Topical QID   Continuous Infusions:   LOS: 6 days   Geradine Girt  [facility]   How to contact the [facility]  Attending or Consulting provider [place] or covering provider during after hours [place], for this patient?  1. Check the care team in [facility] and look for a) attending/consulting [facility] provider listed and b) the [facility] team listed 2. Log into [email or web address] and use [place]'s universal password to access. If you do not have the password, please contact the hospital operator. 3. Locate the [facility] provider you are looking for under [facility] and page to a number that you can be directly reached. 4. If you still have difficulty reaching the provider, please page the [facility] (Director on Call) for the Hospitalists listed on amion for assistance.  06/20/2020, 3:09 PM

## 2020-06-21 DIAGNOSIS — E662 Morbid (severe) obesity with alveolar hypoventilation: Secondary | ICD-10-CM

## 2020-06-21 LAB — BLOOD GAS, ARTERIAL
Acid-Base Excess: 5.3 mmol/L — ABNORMAL HIGH (ref 0.0–2.0)
Bicarbonate: 31.4 mmol/L — ABNORMAL HIGH (ref 20.0–28.0)
Drawn by: 12971
FIO2: 40
O2 Saturation: 95.6 %
Patient temperature: 37
pCO2 arterial: 66.6 mmHg (ref 32.0–48.0)
pH, Arterial: 7.295 — ABNORMAL LOW (ref 7.350–7.450)
pO2, Arterial: 94.5 mmHg (ref 83.0–108.0)

## 2020-06-21 LAB — GLUCOSE, CAPILLARY
Glucose-Capillary: 125 mg/dL — ABNORMAL HIGH (ref 70–99)
Glucose-Capillary: 187 mg/dL — ABNORMAL HIGH (ref 70–99)
Glucose-Capillary: 87 mg/dL (ref 70–99)
Glucose-Capillary: 91 mg/dL (ref 70–99)
Glucose-Capillary: 138 mg/dL — ABNORMAL HIGH (ref 70–99)

## 2020-06-21 MED ORDER — DEXTROSE 50 % IV SOLN
25.0000 mL | Freq: Once | INTRAVENOUS | Status: AC
  Administered 2020-06-21: 25 mL via INTRAVENOUS
  Filled 2020-06-21: qty 50

## 2020-06-21 MED ORDER — GABAPENTIN 100 MG PO CAPS
100.0000 mg | ORAL_CAPSULE | Freq: Every day | ORAL | Status: DC
  Administered 2020-06-21 – 2020-06-26 (×6): 100 mg via ORAL
  Filled 2020-06-21 (×6): qty 1

## 2020-06-21 NOTE — Progress Notes (Signed)
[facility] Progress Note   Subjective: seen in room, asleep on bipap, did not awaken  Objective Vitals:   06/21/20 0454 06/21/20 0757 06/21/20 1200 06/21/20 1305  BP:  (!) 136/50 (!) 128/49   Pulse:  84 81 74  Resp:  20 (!) 23 17  Temp:  98.4 F (36.9 C)    TempSrc:  Oral    SpO2:  91% 98% 100%  Weight: 83 kg     Height:       Physical Exam General: obese WF, awake, chron ill appearing Heart: RRR, no murmurs, rubs or gallops Lungs: CTA bilaterally without wheezing, rhonchi or rales Abdomen: Soft, non-tender, non-distended, +BS Extremities: Trace pitting edema bilateral lower extremities Dialysis Access: RUE AVF accessed  Dialysis: [facility] MWF  4h 85min  90.5kg  2/2 bath  AVF  15g  Hep 3000 Hectorol 2 mcg IV q HD  CXR 9/08 - IMPRESSION: 1. Stable exam, no acute process.   Assessment/Plan: 1. 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 Sharol Given, planned for tibial talar calcaneal fusion but pt's resp status declined so surgery was postponed. To be done this week if remains here.  2. A/C resp failure w/ hypercapnia d/t OSA/ OHS: back on BiPap but just at night now, CXR negative. 3.  ESRD: usual HD is MWF.  HD Monday.  4.  BP/ volume:  Wt's off, CXR neg. No vol^ on exam.  5.  Anemia: Hgb 9.9, we started esa here 40 ug on 9/09 6. Hx of DVT: eliquis resumed since surgery on hold 7.  Metabolic bone disease: Corrected calcium at goal. Continue hectorol. Continue calcium acetate and follow phos level.  8.  Nutrition:  Renal/carb modified diet.  9. T2DM: On insulin, per primary team  Kelly Splinter, MD 06/18/2020, 3:26 PM  Additional Objective Labs: Basic Metabolic Panel: Recent Labs  Lab 06/17/20 1528 06/18/20 0703 06/20/20 0744  NA 138 139 140  K 4.8 4.7 4.0  CL 98 99 101  CO2 31 29 30   GLUCOSE 109* 91 113*  BUN 39* 51* 40*  CREATININE 4.01* 4.74* 4.55*  CALCIUM 8.7* 8.6* 9.0  PHOS  --  6.3* 4.2   Liver Function  Tests: Recent Labs  Lab 06/18/20 0703 06/20/20 0744  ALBUMIN 2.3* 2.6*   CBC: Recent Labs  Lab 06/16/20 0212 06/17/20 1158 06/17/20 1528 06/18/20 0703 06/20/20 0744  WBC 10.0  --  11.2* 8.1 8.3  HGB 9.9*   < > 9.9* 9.7* 9.7*  HCT 33.0*   < > 34.3* 33.1* 32.4*  MCV 101.2*  --  103.9* 102.8* 101.6*  PLT 215  --  204 188 210   < > = values in this interval not displayed.   Blood Culture    Component Value Date/Time   SDES FLUID LEFT PLEURAL 04/06/2017 1248   SDES FLUID LEFT PLEURAL 04/06/2017 1248   SPECREQUEST NONE 04/06/2017 1248   SPECREQUEST NONE 04/06/2017 1248   CULT NO GROWTH 5 DAYS 04/06/2017 1248   REPTSTATUS 04/11/2017 FINAL 04/06/2017 1248   REPTSTATUS 04/08/2017 FINAL 04/06/2017 1248    CBG: Recent Labs  Lab 06/20/20 2057 06/21/20 0619 06/21/20 1120 06/21/20 1616 06/21/20 1617  GLUCAP 165* 125* 187* 91 87    Studies/Results: DG CHEST PORT 1 VIEW  Result Date: 06/20/2020 CLINICAL DATA:  Generalized weakness. Follow-up of basilar atelectasis. EXAM: PORTABLE CHEST 1 VIEW COMPARISON:  06/17/2020 and earlier. FINDINGS: Cardiac silhouette mildly enlarged, unchanged. Mild pulmonary venous hypertension without  overt edema. Increased streaky and patchy airspace opacities at the RIGHT lung base since the examination 3 days ago. Stable mild atelectasis at the LEFT lung base. Note is again made of the chronic BILATERAL shoulder subluxation/dislocation. IMPRESSION: 1. Worsening atelectasis and/or pneumonia at the RIGHT lung base. 2. Stable mild LEFT basilar atelectasis. 3. Stable mild cardiomegaly without pulmonary edema. Electronically Signed   By: Evangeline Dakin M.D.   On: 06/20/2020 15:02   Medications:  . apixaban  2.5 mg Oral BID  . calcium acetate  667 mg Oral TID with meals  . carbidopa-levodopa  1 tablet Oral BID  . Chlorhexidine Gluconate Cloth  6 each Topical Q0600  . colchicine  0.3 mg Oral Daily  . darbepoetin (ARANESP) injection - DIALYSIS  40 mcg  Intravenous Q Wed-HD  . febuxostat  40 mg Oral Daily  . gabapentin  300 mg Oral QHS  . insulin aspart  0-9 Units Subcutaneous TID WC  . insulin NPH Human  10 Units Subcutaneous BID AC & HS  . multivitamin  1 tablet Oral Daily  . oxymetazoline  1 spray Each Nare BID  . pantoprazole  40 mg Oral Daily  . polyethylene glycol  17 g Oral Daily  . rOPINIRole  4 mg Oral BID  . Zinc Oxide   Topical QID

## 2020-06-21 NOTE — Progress Notes (Signed)
Patient placed on Bipap at paused settings. RT aware and will round on patient.

## 2020-06-21 NOTE — Progress Notes (Signed)
RT note-Bipap off at this time, patient sitting on side of bed, continue to monitor.

## 2020-06-21 NOTE — Progress Notes (Addendum)
Progress Note    Jean Mcclain  WEX:[id number] DOB: 1946-03-14  DOA: 06/14/2020 PCP: Leanna Battles, MD    Brief Narrative:    Medical records reviewed and are as summarized below: 74 year old female with history of prior DVT on Eliquis, ESRD on HD MWF, IDDM, morbid obesity with a BMI of 40, OSA not on CPAP, hypertension, hyperlipidemia, GERD, nightly oxygen (2L) came into the hospital with worsening left ankle and foot pain with decreased mobility. This has been going on for a while, does not really recall an injury to the foot. Imaging of the left foot in the ED showed transverse mildly displaced fracture to the medial malleolus, subtalar dislocation with lateral angulation of the majority of the foot, lateral subluxation of the navicular at the talonavicular joint. Orthopedic surgery was consulted and she was admitted to the hospital.  Plan for surgery on 9/8 but patient was confused and lethargic at the time.  So surgery was delayed. Per CSW: she only has 14 days left to use for snf so she may need to have surgery before she leaves instead of leaving and going back .    Assessment/Plan:   Principal Problem:   Ankle fracture Active Problems:   Insulin-requiring or dependent type II diabetes mellitus ([place])   Essential hypertension   Pressure injury of skin   End stage renal disease (HCC)   HLD (hyperlipidemia)   Primary osteoarthritis, left ankle and foot   Left ankle fracture with deformity -orthopedics consulted -planned for surgery on 9/8 but patient had hypercapnic resp failure -now plan is for surgery next week: Anticipate patient will need discharge to skilled nursing.  Orders are placed for a fracture boot weightbearing as tolerated on the left lower extremity.  We will plan to follow-up in the office after discharge to arrange for surgical intervention.  If patient is still hospitalized and stable next Wednesday we could proceed with surgery next Wednesday. -per CSW:  she only has 14 days left to use for snf so she may need to have surgery before she leaves instead of leaving and going back   Acute on Chronic respiratory failure with hypercapnia due to  OSA -on 2L [place] at night -DOES NOT NEED TO BE 100%- prefer sats between 88-90% -improved on bipap- NEED TO WEAR NIGHTLY WHILE SLEEPING AND DURING NAPS -did not wear on the night of 9/11 and was very sleepy on 9/12 AM  Type 2 DM -SSI -resume NPH at a lower dose  ESRD -on HD -nephrology consult appreciated  Chronic disease -Transfuse for hemoglobin less 7  Pressure ulcer- POA Pressure Injury 06/14/20 Buttocks Right;Posterior Stage 2 -  Partial thickness loss of dermis presenting as a shallow open injury with a red, pink wound bed without slough. (Active)  06/14/20 2200  Location: Buttocks  Location Orientation: Right;Posterior  Staging: Stage 2 -  Partial thickness loss of dermis presenting as a shallow open injury with a red, pink wound bed without slough.  Wound Description (Comments):   Present on Admission: Yes  -WOC consult: Using no rinse spray, cleanse buttocks and bilateral posterior upper thighs. Apply triple paste.  mattress with low air loss feature.   H/o DVT -resume eliquis since surgery has been post-poned  RLS -on requip  Morbid obesity Body mass index is 35.74 kg/m.  Post nasal drip -trial of afrin  Fever -? Aspiration pna on right- will hold on abx unless fever   From chart review it appears patient's prednisone was stopped  in April   Will get palliative care consult as she will not wear BIPAP consistently and develops COP retention an AMS   Family Communication/Anticipated D/C date and plan/Code Status   DVT prophylaxis: restart eliquis as surgery has been postponed Code Status: Full Code.  Family Communication: spoke with brother 9/11 Disposition Plan: Status is: Inpatient  Remains inpatient appropriate because:Inpatient level of care appropriate due to  severity of illness   Dispo: The patient is from: ILF?               Anticipated d/c is to: SNF              Anticipated d/c date is: after surgery             per CSW: she only has 14 days left to use for snf so she may need to have surgery before she leaves instead of leaving and going back .          Medical Consultants:    Orthopedics  PCCM  renal     Subjective:   Very sleepy this AM and she says she is not sure why  Objective:    Vitals:   06/21/20 0454 06/21/20 0757 06/21/20 1200 06/21/20 1305  BP:  (!) 136/50 (!) 128/49   Pulse:  84 81 74  Resp:  20 (!) 23 17  Temp:  98.4 F (36.9 C)    TempSrc:  Oral    SpO2:  91% 98% 100%  Weight: 83 kg     Height:        Intake/Output Summary (Last 24 hours) at 06/21/2020 1405 Last data filed at 06/21/2020 0501 Gross per 24 hour  Intake 240 ml  Output 0 ml  Net 240 ml   Filed Weights   06/20/20 0400 06/20/20 0709 06/21/20 0454  Weight: 95.7 kg 94 kg 83 kg    Exam:   General: Appearance:    Obese female who is very sleepy this AM but will awaken     Lungs:     respirations unlabored, no wheezing  Heart:    Normal heart rate. Normal rhythm. No murmurs, rubs, or gallops.   MS:   All extremities are intact. +edema LE  Neurologic:   Sleepy, yawning-- will awaken but falls back asleep      Data Reviewed:   I have personally reviewed following labs and imaging studies:  Labs: Labs show the following:   Basic Metabolic Panel: Recent Labs  Lab 06/14/20 1634 06/14/20 1634 06/16/20 0212 06/16/20 0212 06/17/20 1158 06/17/20 1158 06/17/20 1528 06/17/20 1528 06/18/20 0703 06/20/20 0744  NA 143   < > 140  --  136  --  138  --  139 140  K 4.8   < > 4.3   < > 4.7   < > 4.8   < > 4.7 4.0  CL 99   < > 99  --  98  --  98  --  99 101  CO2 28  --  29  --   --   --  31  --  29 30  GLUCOSE 132*   < > 204*  --  107*  --  109*  --  91 113*  BUN 71*   < > 81*  --  42*  --  39*  --  51* 40*  CREATININE  4.81*   < > 5.06*  --  3.70*  --  4.01*  --  4.74* 4.55*  CALCIUM 8.4*  --  8.2*  --   --   --  8.7*  --  8.6* 9.0  PHOS  --   --   --   --   --   --   --   --  6.3* 4.2   < > = values in this interval not displayed.   GFR Estimated Creatinine Clearance: 10.4 mL/min (A) (by C-G formula based on SCr of 4.55 mg/dL (H)). Liver Function Tests: Recent Labs  Lab 06/14/20 1634 06/18/20 0703 06/20/20 0744  AST 26  --   --   ALT 25  --   --   ALKPHOS 91  --   --   BILITOT 0.9  --   --   PROT 5.6*  --   --   ALBUMIN 2.9* 2.3* 2.6*   No results for input(s): LIPASE, AMYLASE in the last 168 hours. No results for input(s): AMMONIA in the last 168 hours. Coagulation profile No results for input(s): INR, PROTIME in the last 168 hours.  CBC: Recent Labs  Lab 06/14/20 1634 06/14/20 1634 06/16/20 0212 06/17/20 1158 06/17/20 1528 06/18/20 0703 06/20/20 0744  WBC 10.4  --  10.0  --  11.2* 8.1 8.3  NEUTROABS 9.0*  --   --   --   --   --   --   HGB 11.1*   < > 9.9* 11.6* 9.9* 9.7* 9.7*  HCT 37.3   < > 33.0* 34.0* 34.3* 33.1* 32.4*  MCV 101.6*  --  101.2*  --  103.9* 102.8* 101.6*  PLT 239  --  215  --  204 188 210   < > = values in this interval not displayed.   Cardiac Enzymes: No results for input(s): CKTOTAL, CKMB, CKMBINDEX, TROPONINI in the last 168 hours. BNP (last 3 results) No results for input(s): PROBNP in the last 8760 hours. CBG: Recent Labs  Lab 06/20/20 1130 06/20/20 1621 06/20/20 2057 06/21/20 0619 06/21/20 1120  GLUCAP 105* 192* 165* 125* 187*   D-Dimer: No results for input(s): DDIMER in the last 72 hours. Hgb A1c: No results for input(s): HGBA1C in the last 72 hours. Lipid Profile: No results for input(s): CHOL, HDL, LDLCALC, TRIG, CHOLHDL, LDLDIRECT in the last 72 hours. Thyroid function studies: No results for input(s): TSH, T4TOTAL, T3FREE, THYROIDAB in the last 72 hours.  Invalid input(s): FREET3 Anemia work up: No results for input(s): VITAMINB12,  FOLATE, FERRITIN, TIBC, IRON, RETICCTPCT in the last 72 hours. Sepsis Labs: Recent Labs  Lab 06/16/20 0212 06/17/20 1528 06/18/20 0703 06/20/20 0744  WBC 10.0 11.2* 8.1 8.3    Microbiology Recent Results (from the past 240 hour(s))  SARS Coronavirus 2 by RT PCR (hospital order, performed in [facility] hospital lab) Nasopharyngeal Nasopharyngeal Swab     Status: None   Collection Time: 06/14/20  4:35 PM   Specimen: Nasopharyngeal Swab  Result Value Ref Range Status   SARS Coronavirus 2 NEGATIVE NEGATIVE Final    Comment: (NOTE) SARS-CoV-2 target nucleic acids are NOT DETECTED.  The SARS-CoV-2 RNA is generally detectable in upper and lower respiratory specimens during the acute phase of infection. The lowest concentration of SARS-CoV-2 viral copies this assay can detect is 250 copies / mL. A negative result does not preclude SARS-CoV-2 infection and should not be used as the sole basis for treatment or other patient management decisions.  A negative result may occur with improper specimen collection / handling, submission of specimen other than nasopharyngeal swab, presence  of viral mutation(s) within the areas targeted by this assay, and inadequate number of viral copies (<250 copies / mL). A negative result must be combined with clinical observations, patient history, and epidemiological information.  Fact Sheet for Patients:   StrictlyIdeas.no  Fact Sheet for Healthcare Providers: BankingDealers.co.za  This test is not yet approved or  cleared by the [place] FDA and has been authorized for detection and/or diagnosis of SARS-CoV-2 by FDA under an Emergency Use Authorization (EUA).  This EUA will remain in effect (meaning this test can be used) for the duration of the COVID-19 declaration under Section 564(b)(1) of the Act, 21 U.S.C. section 360bbb-3(b)(1), unless the authorization is terminated or revoked  sooner.  Performed at [facility], [place] [address] [place], [place] [address]   Surgical pcr screen     Status: None   Collection Time: 06/14/20 10:50 PM   Specimen: Nasal Mucosa; Nasal Swab  Result Value Ref Range Status   MRSA, PCR NEGATIVE NEGATIVE Final   Staphylococcus aureus NEGATIVE NEGATIVE Final    Comment: (NOTE) The Xpert SA Assay (FDA approved for NASAL specimens in patients 58 years of age and older), is one component of a comprehensive surveillance program. It is not intended to diagnose infection nor to guide or monitor treatment. Performed at [facility], [place] [address], [place], [place] [address]   SARS Coronavirus 2 by RT PCR (hospital order, performed in [facility] hospital lab) Nasopharyngeal Nasopharyngeal Swab     Status: None   Collection Time: 06/19/20  5:13 PM   Specimen: Nasopharyngeal Swab  Result Value Ref Range Status   SARS Coronavirus 2 NEGATIVE NEGATIVE Final    Comment: (NOTE) SARS-CoV-2 target nucleic acids are NOT DETECTED.  The SARS-CoV-2 RNA is generally detectable in upper and lower respiratory specimens during the acute phase of infection. The lowest concentration of SARS-CoV-2 viral copies this assay can detect is 250 copies / mL. A negative result does not preclude SARS-CoV-2 infection and should not be used as the sole basis for treatment or other patient management decisions.  A negative result may occur with improper specimen collection / handling, submission of specimen other than nasopharyngeal swab, presence of viral mutation(s) within the areas targeted by this assay, and inadequate number of viral copies (<250 copies / mL). A negative result must be combined with clinical observations, patient history, and epidemiological information.  Fact Sheet for Patients:   StrictlyIdeas.no  Fact Sheet for Healthcare Providers: BankingDealers.co.za  This test is  not yet approved or  cleared by the [place] FDA and has been authorized for detection and/or diagnosis of SARS-CoV-2 by FDA under an Emergency Use Authorization (EUA).  This EUA will remain in effect (meaning this test can be used) for the duration of the COVID-19 declaration under Section 564(b)(1) of the Act, 21 U.S.C. section 360bbb-3(b)(1), unless the authorization is terminated or revoked sooner.  Performed at [facility], [place] [address] [place], [place] [address]     Procedures and diagnostic studies:  DG CHEST PORT 1 VIEW  Result Date: 06/20/2020 CLINICAL DATA:  Generalized weakness. Follow-up of basilar atelectasis. EXAM: PORTABLE CHEST 1 VIEW COMPARISON:  06/17/2020 and earlier. FINDINGS: Cardiac silhouette mildly enlarged, unchanged. Mild pulmonary venous hypertension without overt edema. Increased streaky and patchy airspace opacities at the RIGHT lung base since the examination 3 days ago. Stable mild atelectasis at the LEFT lung base. Note is again made of the chronic BILATERAL shoulder subluxation/dislocation. IMPRESSION:  1. Worsening atelectasis and/or pneumonia at the RIGHT lung base. 2. Stable mild LEFT basilar atelectasis. 3. Stable mild cardiomegaly without pulmonary edema. Electronically Signed   By: Evangeline Dakin M.D.   On: 06/20/2020 15:02    Medications:   . apixaban  2.5 mg Oral BID  . calcium acetate  667 mg Oral TID with meals  . carbidopa-levodopa  1 tablet Oral BID  . Chlorhexidine Gluconate Cloth  6 each Topical Q0600  . colchicine  0.3 mg Oral Daily  . darbepoetin (ARANESP) injection - DIALYSIS  40 mcg Intravenous Q Wed-HD  . febuxostat  40 mg Oral Daily  . gabapentin  300 mg Oral QHS  . insulin aspart  0-9 Units Subcutaneous TID WC  . insulin NPH Human  10 Units Subcutaneous BID AC & HS  . multivitamin  1 tablet Oral Daily  . oxymetazoline  1 spray Each Nare BID  . pantoprazole  40 mg Oral Daily  . polyethylene glycol  17 g Oral Daily   . rOPINIRole  4 mg Oral BID  . Zinc Oxide   Topical QID   Continuous Infusions:   LOS: 7 days   Geradine Girt  [facility]   How to contact the [facility] Attending or Consulting provider [place] or covering provider during after hours [place], for this patient?  1. Check the care team in [facility] and look for a) attending/consulting TR[facility] provider listed and b) the [place] team listed 2. Log into [email or web address] and use [place]'s universal password to access. If you do not have the password, please contact the hospital operator. 3. Locate the [facility] provider you are looking for under [facility] and page to a number that you can be directly reached. 4. If you still have difficulty reaching the provider, please page the [facility] (Director on Call) for the Hospitalists listed on amion for assistance.  06/21/2020, 2:05 PM

## 2020-06-21 NOTE — Progress Notes (Signed)
   06/21/20 1200  Assess: MEWS Score  BP (!) 128/49  Pulse Rate 81  ECG Heart Rate 80  Resp (!) 23  Level of Consciousness Responds to Voice  SpO2 98 %  O2 Device Bi-PAP  Assess: MEWS Score  MEWS Temp 0  MEWS Systolic 0  MEWS Pulse 0  MEWS RR 1  MEWS LOC 1  MEWS Score 2  MEWS Score Color Yellow  Assess: if the MEWS score is Yellow or Red  Were vital signs taken at a resting state? Yes  Focused Assessment No change from prior assessment  Early Detection of Sepsis Score *See Row Information* Low  MEWS guidelines implemented *See Row Information* Yes  Treat  MEWS Interventions Consulted Respiratory Therapy  Pain Scale 0-10  Pain Score Asleep  Take Vital Signs  Increase Vital Sign Frequency  Yellow: Q 2hr X 2 then Q 4hr X 2, if remains yellow, continue Q 4hrs  Escalate  MEWS: Escalate Yellow: discuss with charge nurse/RN and consider discussing with provider and RRT  Notify: Charge Nurse/RN  Name of Charge Nurse/RN Notified Tai RN  Date Charge Nurse/RN Notified 06/21/20  Time Charge Nurse/RN Notified 1220  Document  Patient Outcome Other (Comment) (placed on BiPap)  Progress note created (see row info) Yes   Patient has been more lethargic as the day has progressed. After MD rounding this RN was asked to place patient back on bipap. Per RT note from the night before patient refused Bipap over night. Will continue to monitor. Charge RN, MD and RT all aware

## 2020-06-22 DIAGNOSIS — Z7189 Other specified counseling: Secondary | ICD-10-CM

## 2020-06-22 DIAGNOSIS — G4733 Obstructive sleep apnea (adult) (pediatric): Secondary | ICD-10-CM

## 2020-06-22 LAB — CBC
HCT: 34.3 % — ABNORMAL LOW (ref 36.0–46.0)
Hemoglobin: 10.1 g/dL — ABNORMAL LOW (ref 12.0–15.0)
MCH: 30.1 pg (ref 26.0–34.0)
MCHC: 29.4 g/dL — ABNORMAL LOW (ref 30.0–36.0)
MCV: 102.1 fL — ABNORMAL HIGH (ref 80.0–100.0)
Platelets: 237 10*3/uL (ref 150–400)
RBC: 3.36 MIL/uL — ABNORMAL LOW (ref 3.87–5.11)
RDW: 14.8 % (ref 11.5–15.5)
WBC: 10 10*3/uL (ref 4.0–10.5)
nRBC: 0 % (ref 0.0–0.2)

## 2020-06-22 LAB — RENAL FUNCTION PANEL
Albumin: 2.6 g/dL — ABNORMAL LOW (ref 3.5–5.0)
Anion gap: 10 (ref 5–15)
BUN: 38 mg/dL — ABNORMAL HIGH (ref 8–23)
CO2: 30 mmol/L (ref 22–32)
Calcium: 9.2 mg/dL (ref 8.9–10.3)
Chloride: 97 mmol/L — ABNORMAL LOW (ref 98–111)
Creatinine, Ser: 6.08 mg/dL — ABNORMAL HIGH (ref 0.44–1.00)
GFR calc Af Amer: 7 mL/min — ABNORMAL LOW (ref >60)
GFR calc non Af Amer: 6 mL/min — ABNORMAL LOW (ref >60)
Glucose, Bld: 169 mg/dL — ABNORMAL HIGH (ref 70–99)
Phosphorus: 5.3 mg/dL — ABNORMAL HIGH (ref 2.5–4.6)
Potassium: 4.1 mmol/L (ref 3.5–5.1)
Sodium: 137 mmol/L (ref 135–145)

## 2020-06-22 LAB — GLUCOSE, CAPILLARY
Glucose-Capillary: 110 mg/dL — ABNORMAL HIGH (ref 70–99)
Glucose-Capillary: 202 mg/dL — ABNORMAL HIGH (ref 70–99)
Glucose-Capillary: 212 mg/dL — ABNORMAL HIGH (ref 70–99)

## 2020-06-22 MED ORDER — HEPARIN SODIUM (PORCINE) 1000 UNIT/ML DIALYSIS: 3000.0000 [IU] | Freq: Once | INTRAMUSCULAR | Status: DC

## 2020-06-22 MED ORDER — INSULIN NPH (HUMAN) (ISOPHANE) 100 UNIT/ML ~~LOC~~ SUSP
10.0000 [IU] | Freq: Two times a day (BID) | SUBCUTANEOUS | Status: DC
  Administered 2020-06-22 – 2020-06-26 (×6): 10 [IU] via SUBCUTANEOUS
  Filled 2020-06-22: qty 10

## 2020-06-22 MED ORDER — POLYVINYL ALCOHOL 1.4 % OP SOLN
1.0000 [drp] | OPHTHALMIC | Status: DC | PRN
  Filled 2020-06-22: qty 15

## 2020-06-22 MED ORDER — EYE WASH OPHTH SOLN
1.0000 [drp] | OPHTHALMIC | Status: DC | PRN
  Filled 2020-06-22: qty 118

## 2020-06-22 MED ORDER — HEPARIN SODIUM (PORCINE) 1000 UNIT/ML IJ SOLN
INTRAMUSCULAR | Status: AC
  Administered 2020-06-22: 1000 [IU]
  Filled 2020-06-22: qty 3

## 2020-06-22 NOTE — Progress Notes (Signed)
Pt taken off bipap to receive evening medications and eat.  Pt will resume bipap tonight.

## 2020-06-22 NOTE — Progress Notes (Signed)
Nephrology Follow-Up Consult note   Assessment/Recommendations: Jean Mcclain is a/an 74 y.o. female with a past medical history significant for DVT on eliquis, ESRD, DM, obesity, OSA< HTN, HLD, admitted for ankle pain.     1. 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 Sharol Given, planned for tibial talar calcaneal fusion but pt's resp status declined so surgery was postponed. To be done this week hopefully 2. A/C resp failure w/ hypercapnia d/t OSA/ OHS: Bipap nightly. Some SOB today. Increase UF w/ HD. 3. ESRD:usual HD is MWF.  HD today 4. BP/ volume:  Wt's off, CXR neg. No obvious volume overload but will try to pull fluid on HD to help with fluid status 5. Anemia:Hgb 9.7, we started esa here 40 ug on 9/09 6. Hx of DVT: eliquis resumed since surgery on hold 7. Metabolic bone disease:Continue hecterol and phos binder with meals 8. Nutrition:Renal/carb modified diet.  9. T2DM: On insulin, per primary team   Recommendations conveyed to primary service.    [name] 06/22/2020 8:48 AM  ___________________________________________________________  CC: ESRD  Interval History/Subjective: Patient states she feels somewhat better but continues to have SOB. No other complaints. Denies pain.   Medications:  Current Facility-Administered Medications  Medication Dose Route Frequency Provider Last Rate Last Admin  . acetaminophen (TYLENOL) tablet 650 mg  650 mg Oral Q6H PRN Shela Leff, MD   650 mg at 06/21/20 2218   Or  . acetaminophen (TYLENOL) suppository 650 mg  650 mg Rectal Q6H PRN Shela Leff, MD      . apixaban Arne Cleveland) tablet 2.5 mg  2.5 mg Oral BID Eulogio Bear U, DO   2.5 mg at 06/21/20 2218  . calcium acetate (PHOSLO) capsule 667 mg  667 mg Oral TID with meals Caren Griffins, MD   667 mg at 06/21/20 0821  . calcium acetate (PHOSLO) capsule 667 mg  667 mg Oral Daily PRN Blenda Nicely, RPH      . carbidopa-levodopa (SINEMET IR) 10-100 MG per tablet immediate release 1 tablet  1 tablet Oral BID Caren Griffins, MD   1 tablet at 06/21/20 0817  . Chlorhexidine Gluconate Cloth 2 % PADS 6 each  6 each Topical Q0600 Janalee Dane, PA-C   6 each at 06/22/20 6063  . colchicine tablet 0.3 mg  0.3 mg Oral Daily Vann, Jessica U, DO   0.3 mg at 06/21/20 1046  . Darbepoetin Alfa (ARANESP) injection 40 mcg  40 mcg Intravenous Q Wed-HD Collins, Samantha G, PA-C      . febuxostat (ULORIC) tablet 40 mg  40 mg Oral Daily Caren Griffins, MD   40 mg at 06/21/20 1046  . gabapentin (NEURONTIN) capsule 100 mg  100 mg Oral QHS Vann, Jessica U, DO   100 mg at 06/21/20 2218  . insulin aspart (novoLOG) injection 0-9 Units  0-9 Units Subcutaneous TID WC Vann, Jessica U, DO   2 Units at 06/21/20 1300  . levalbuterol (XOPENEX) nebulizer solution 0.63 mg  0.63 mg Nebulization Q8H PRN Lovey Newcomer T, NP   0.63 mg at 06/15/20 0324  . multivitamin (RENA-VIT) tablet 1 tablet  1 tablet Oral Daily Caren Griffins, MD   1 tablet at 06/21/20 1046  . Muscle Rub CREA   Topical PRN Eulogio Bear U, DO      . ondansetron (ZOFRAN) injection 4 mg  4 mg Intravenous Q6H PRN Caren Griffins, MD  4 mg at 06/20/20 1428  . oxymetazoline (AFRIN) 0.05 % nasal spray 1 spray  1 spray Each Nare BID Eulogio Bear U, DO   1 spray at 06/21/20 2216  . pantoprazole (PROTONIX) EC tablet 40 mg  40 mg Oral Daily Caren Griffins, MD   40 mg at 06/21/20 1046  . polyethylene glycol (MIRALAX / GLYCOLAX) packet 17 g  17 g Oral Daily Caren Griffins, MD   17 g at 06/19/20 0850  . rOPINIRole (REQUIP) tablet 4 mg  4 mg Oral BID Caren Griffins, MD   4 mg at 06/21/20 2217  . sodium chloride (OCEAN) 0.65 % nasal spray 1 spray  1 spray Each Nare PRN Eulogio Bear U, DO   1 spray at 06/20/20 2150  . Zinc Oxide (TRIPLE PASTE) 12.8 % ointment   Topical QID Caren Griffins, MD   1 application at 99/24/26 2216    Facility-Administered Medications Ordered in Other Encounters  Medication Dose Route Frequency Provider Last Rate Last Admin  . bupivacaine (PF) (MARCAINE) 0.25 % injection   Other Anesthesia Intra-op Duane Boston, MD   15 mL at 06/17/20 1107  . bupivacaine liposome (EXPAREL) 1.3 % injection   Peri-NEURAL Anesthesia Intra-op Duane Boston, MD   10 mL at 06/17/20 1107      Review of Systems: 10 systems reviewed and negative except per interval history/subjective  Physical Exam: Vitals:   06/22/20 0440 06/22/20 0825  BP: (!) 119/50   Pulse: 64 (!) 58  Resp: 16 20  Temp: 97.7 F (36.5 C)   SpO2: 98% 95%   No intake/output data recorded.  Intake/Output Summary (Last 24 hours) at 06/22/2020 0848 Last data filed at 06/21/2020 0900 Gross per 24 hour  Intake 240 ml  Output --  Net 240 ml   Constitutional: chronically ill appearing no distress ENMT: ears and nose without scars or lesions, MMM CV: normal rate, no edema Respiratory: coarse upper airway sounds, mild iwob Gastrointestinal: soft, non-tender, no palpable masses or hernias Skin: seborrhea on nose and cheeks, otherwise no visible lesions or rashes Psych: alert, judgement/insight appropriate, appropriate mood and affect   Test Results I personally reviewed new and old clinical labs and radiology tests Lab Results  Component Value Date   NA 140 06/20/2020   K 4.0 06/20/2020   CL 101 06/20/2020   CO2 30 06/20/2020   BUN 40 (H) 06/20/2020   CREATININE 4.55 (H) 06/20/2020   CALCIUM 9.0 06/20/2020   ALBUMIN 2.6 (L) 06/20/2020   PHOS 4.2 06/20/2020

## 2020-06-22 NOTE — Progress Notes (Signed)
Progress Note    Jean Mcclain  WRU:[id number] DOB: 02/13/1946  DOA: 06/14/2020 PCP: Leanna Battles, MD    Brief Narrative:    Medical records reviewed and are as summarized below: 74 year old female with history of prior DVT on Eliquis, ESRD on HD MWF, IDDM, morbid obesity with a BMI of 40, OSA not on CPAP, hypertension, hyperlipidemia, GERD, nightly oxygen (2L) came into the hospital with worsening left ankle and foot pain with decreased mobility. This has been going on for a while, does not really recall an injury to the foot. Imaging of the left foot in the ED showed transverse mildly displaced fracture to the medial malleolus, subtalar dislocation with lateral angulation of the majority of the foot, lateral subluxation of the navicular at the talonavicular joint. Orthopedic surgery was consulted and she was admitted to the hospital.  Plan for surgery on 9/8 but patient was confused and lethargic at the time.  So surgery was delayed. Per CSW: she only has 14 days left to use for snf so she may need to have surgery before she leaves instead of leaving and going back .    Assessment/Plan:   Principal Problem:   Ankle fracture Active Problems:   Insulin-requiring or dependent type II diabetes mellitus ([place])   Essential hypertension   Pressure injury of skin   End stage renal disease (HCC)   HLD (hyperlipidemia)   Primary osteoarthritis, left ankle and foot   Left ankle fracture with deformity -orthopedics consulted -planned for surgery on 9/8 but patient had hypercapnic resp failure -now plan is for surgery next week: Anticipate patient will need discharge to skilled nursing.  Orders are placed for a fracture boot weightbearing as tolerated on the left lower extremity.  We will plan to follow-up in the office after discharge to arrange for surgical intervention.  If patient is still hospitalized and stable next Wednesday we could proceed with surgery next Wednesday. -per CSW:  she only has 14 days left to use for snf so she may need to have surgery before she leaves instead of leaving and going back   Acute on Chronic respiratory failure with hypercapnia due to  OSA -on 2L  at night at home -DOES NOT NEED TO BE 100%- prefer sats between 88-90% -improved on bipap- NEED TO WEAR NIGHTLY WHILE SLEEPING AND DURING NAPS -did not wear on the night of 9/11 and was very sleepy on 9/12 AM, improved with continued bipap  Type 2 DM -SSI -resume NPH at a lower dose  ESRD -on HD -nephrology consult appreciated  Chronic disease -Transfuse for hemoglobin less 7  Pressure ulcer- POA Pressure Injury 06/14/20 Buttocks Right;Posterior Stage 2 -  Partial thickness loss of dermis presenting as a shallow open injury with a red, pink wound bed without slough. (Active)  06/14/20 2200  Location: Buttocks  Location Orientation: Right;Posterior  Staging: Stage 2 -  Partial thickness loss of dermis presenting as a shallow open injury with a red, pink wound bed without slough.  Wound Description (Comments):   Present on Admission: Yes  -WOC consult: Using no rinse spray, cleanse buttocks and bilateral posterior upper thighs. Apply triple paste.  mattress with low air loss feature.   H/o DVT -resume eliquis since surgery has been post-poned  RLS -on requip  Morbid obesity Body mass index is 36.17 kg/m.  Post nasal drip -trial of afrin    From chart review it appears patient's prednisone was stopped in April   Will get  palliative care consult as she will not wear BIPAP consistently and develops CO2 retention and AMS  Seems to have poor insight into her multiple medical conditions  Family Communication/Anticipated D/C date and plan/Code Status   DVT prophylaxis: restart eliquis as surgery has been postponed Code Status: Full Code.  Family Communication: spoke with brother 9/11 Disposition Plan: Status is: Inpatient  Remains inpatient appropriate because:Inpatient  level of care appropriate due to severity of illness   Dispo: The patient is from: ILF?               Anticipated d/c is to: SNF              Anticipated d/c date is: after surgery             per CSW: she only has 14 days left to use for snf so she may need to have surgery before she leaves instead of leaving and going back .          Medical Consultants:    Orthopedics  PCCM  renal     Subjective:   Only c/o nutrition not getting her food orders correct  Objective:    Vitals:   06/22/20 0440 06/22/20 0825 06/22/20 0900 06/22/20 1200  BP: (!) 119/50  (!) 132/57 133/72  Pulse: 64 (!) 58  75  Resp: 16 20  20   Temp: 97.7 F (36.5 C)   98.3 F (36.8 C)  TempSrc: Axillary     SpO2: 98% 95%  95%  Weight: 84 kg     Height:        Intake/Output Summary (Last 24 hours) at 06/22/2020 1342 Last data filed at 06/22/2020 0900 Gross per 24 hour  Intake 240 ml  Output --  Net 240 ml   Filed Weights   06/20/20 0709 06/21/20 0454 06/22/20 0440  Weight: 94 kg 83 kg 84 kg    Exam:  General: Appearance:    Obese female in no acute distress     Lungs:     Appears to have mildly increased work of breathing (patient denies feeling SOB)  Heart:    Normal heart rate. Normal rhythm. No murmurs, rubs, or gallops.      Neurologic:   Awake, alert, oriented x 3. Poor insight      Data Reviewed:   I have personally reviewed following labs and imaging studies:  Labs: Labs show the following:   Basic Metabolic Panel: Recent Labs  Lab 06/16/20 0212 06/16/20 0212 06/17/20 1158 06/17/20 1158 06/17/20 1528 06/17/20 1528 06/18/20 0703 06/20/20 0744  NA 140  --  136  --  138  --  139 140  K 4.3   < > 4.7   < > 4.8   < > 4.7 4.0  CL 99  --  98  --  98  --  99 101  CO2 29  --   --   --  31  --  29 30  GLUCOSE 204*  --  107*  --  109*  --  91 113*  BUN 81*  --  42*  --  39*  --  51* 40*  CREATININE 5.06*  --  3.70*  --  4.01*  --  4.74* 4.55*  CALCIUM 8.2*  --    --   --  8.7*  --  8.6* 9.0  PHOS  --   --   --   --   --   --  6.3* 4.2   < > =  values in this interval not displayed.   GFR Estimated Creatinine Clearance: 10.4 mL/min (A) (by C-G formula based on SCr of 4.55 mg/dL (H)). Liver Function Tests: Recent Labs  Lab 06/18/20 0703 06/20/20 0744  ALBUMIN 2.3* 2.6*   No results for input(s): LIPASE, AMYLASE in the last 168 hours. No results for input(s): AMMONIA in the last 168 hours. Coagulation profile No results for input(s): INR, PROTIME in the last 168 hours.  CBC: Recent Labs  Lab 06/16/20 0212 06/17/20 1158 06/17/20 1528 06/18/20 0703 06/20/20 0744  WBC 10.0  --  11.2* 8.1 8.3  HGB 9.9* 11.6* 9.9* 9.7* 9.7*  HCT 33.0* 34.0* 34.3* 33.1* 32.4*  MCV 101.2*  --  103.9* 102.8* 101.6*  PLT 215  --  204 188 210   Cardiac Enzymes: No results for input(s): CKTOTAL, CKMB, CKMBINDEX, TROPONINI in the last 168 hours. BNP (last 3 results) No results for input(s): PROBNP in the last 8760 hours. CBG: Recent Labs  Lab 06/21/20 1616 06/21/20 1617 06/21/20 2130 06/22/20 0631 06/22/20 1108  GLUCAP 91 87 138* 110* 202*   D-Dimer: No results for input(s): DDIMER in the last 72 hours. Hgb A1c: No results for input(s): HGBA1C in the last 72 hours. Lipid Profile: No results for input(s): CHOL, HDL, LDLCALC, TRIG, CHOLHDL, LDLDIRECT in the last 72 hours. Thyroid function studies: No results for input(s): TSH, T4TOTAL, T3FREE, THYROIDAB in the last 72 hours.  Invalid input(s): FREET3 Anemia work up: No results for input(s): VITAMINB12, FOLATE, FERRITIN, TIBC, IRON, RETICCTPCT in the last 72 hours. Sepsis Labs: Recent Labs  Lab 06/16/20 0212 06/17/20 1528 06/18/20 0703 06/20/20 0744  WBC 10.0 11.2* 8.1 8.3    Microbiology Recent Results (from the past 240 hour(s))  SARS Coronavirus 2 by RT PCR (hospital order, performed in [facility] hospital lab) Nasopharyngeal Nasopharyngeal Swab     Status: None   Collection Time:  06/14/20  4:35 PM   Specimen: Nasopharyngeal Swab  Result Value Ref Range Status   SARS Coronavirus 2 NEGATIVE NEGATIVE Final    Comment: (NOTE) SARS-CoV-2 target nucleic acids are NOT DETECTED.  The SARS-CoV-2 RNA is generally detectable in upper and lower respiratory specimens during the acute phase of infection. The lowest concentration of SARS-CoV-2 viral copies this assay can detect is 250 copies / mL. A negative result does not preclude SARS-CoV-2 infection and should not be used as the sole basis for treatment or other patient management decisions.  A negative result may occur with improper specimen collection / handling, submission of specimen other than nasopharyngeal swab, presence of viral mutation(s) within the areas targeted by this assay, and inadequate number of viral copies (<250 copies / mL). A negative result must be combined with clinical observations, patient history, and epidemiological information.  Fact Sheet for Patients:   StrictlyIdeas.no  Fact Sheet for Healthcare Providers: BankingDealers.co.za  This test is not yet approved or  cleared by the [place] FDA and has been authorized for detection and/or diagnosis of SARS-CoV-2 by FDA under an Emergency Use Authorization (EUA).  This EUA will remain in effect (meaning this test can be used) for the duration of the COVID-19 declaration under Section 564(b)(1) of the Act, 21 U.S.C. section 360bbb-3(b)(1), unless the authorization is terminated or revoked sooner.  Performed at [facility], [place] [address], [place], [place] [address]   Surgical pcr screen     Status: None   Collection Time: 06/14/20 10:50 PM   Specimen: Nasal Mucosa; Nasal Swab  Result Value Ref Range Status   MRSA, PCR NEGATIVE NEGATIVE Final   Staphylococcus aureus NEGATIVE NEGATIVE Final    Comment: (NOTE) The Xpert SA Assay (FDA approved for NASAL specimens in  patients 78 years of age and older), is one component of a comprehensive surveillance program. It is not intended to diagnose infection nor to guide or monitor treatment. Performed at [facility], [place] [address], [place], [place] [address]   SARS Coronavirus 2 by RT PCR (hospital order, performed in [facility] hospital lab) Nasopharyngeal Nasopharyngeal Swab     Status: None   Collection Time: 06/19/20  5:13 PM   Specimen: Nasopharyngeal Swab  Result Value Ref Range Status   SARS Coronavirus 2 NEGATIVE NEGATIVE Final    Comment: (NOTE) SARS-CoV-2 target nucleic acids are NOT DETECTED.  The SARS-CoV-2 RNA is generally detectable in upper and lower respiratory specimens during the acute phase of infection. The lowest concentration of SARS-CoV-2 viral copies this assay can detect is 250 copies / mL. A negative result does not preclude SARS-CoV-2 infection and should not be used as the sole basis for treatment or other patient management decisions.  A negative result may occur with improper specimen collection / handling, submission of specimen other than nasopharyngeal swab, presence of viral mutation(s) within the areas targeted by this assay, and inadequate number of viral copies (<250 copies / mL). A negative result must be combined with clinical observations, patient history, and epidemiological information.  Fact Sheet for Patients:   StrictlyIdeas.no  Fact Sheet for Healthcare Providers: BankingDealers.co.za  This test is not yet approved or  cleared by the [place] FDA and has been authorized for detection and/or diagnosis of SARS-CoV-2 by FDA under an Emergency Use Authorization (EUA).  This EUA will remain in effect (meaning this test can be used) for the duration of the COVID-19 declaration under Section 564(b)(1) of the Act, 21 U.S.C. section 360bbb-3(b)(1), unless the authorization is terminated or revoked  sooner.  Performed at [facility], [place] [address] [place], [place] [address]     Procedures and diagnostic studies:  No results found.  Medications:   . apixaban  2.5 mg Oral BID  . calcium acetate  667 mg Oral TID with meals  . carbidopa-levodopa  1 tablet Oral BID  . Chlorhexidine Gluconate Cloth  6 each Topical Q0600  . colchicine  0.3 mg Oral Daily  . darbepoetin (ARANESP) injection - DIALYSIS  40 mcg Intravenous Q Wed-HD  . febuxostat  40 mg Oral Daily  . gabapentin  100 mg Oral QHS  . insulin aspart  0-9 Units Subcutaneous TID WC  . insulin NPH Human  10 Units Subcutaneous BID AC & HS  . multivitamin  1 tablet Oral Daily  . oxymetazoline  1 spray Each Nare BID  . pantoprazole  40 mg Oral Daily  . polyethylene glycol  17 g Oral Daily  . rOPINIRole  4 mg Oral BID  . Zinc Oxide   Topical QID   Continuous Infusions:   LOS: 8 days   Geradine Girt  [facility]   How to contact the [facility] Attending or Consulting provider [place] or covering provider during after hours [place], for this patient?  1. Check the care team in [facility] and look for a) attending/consulting [facility] provider listed and b) the [facility] team listed 2. Log into [email or web address] and use [place]'s universal password to access. If you do not have the password, please  contact the hospital operator. 3. Locate the [facility] provider you are looking for under [facility] and page to a number that you can be directly reached. 4. If you still have difficulty reaching the provider, please page the [facility] (Director on Call) for the Hospitalists listed on amion for assistance.  06/22/2020, 1:42 PM

## 2020-06-22 NOTE — Consult Note (Signed)
Consultation Note Date: 06/22/2020   Patient Name: Jean Mcclain  DOB: 03/31/1946  MRN: [id number]  Age / Sex: 74 y.o., female  PCP: Leanna Battles, MD Referring Physician: Geradine Girt, DO  Reason for Consultation: Establishing goals of care  HPI/Patient Profile: 74 y.o. female  with past medical history of OSA, DM2, ESRD on HD, morbid obesity with BMI 40, admitted on 06/14/2020 with ankle dislocation and medial malleolar fracture. She was planned for surgery, however, on morning of was found to have respiratory failure with increased CO2. Surgery was delayed to following Wednesday. Patient continues to remove/refuse bipap at times. Palliative medicine consulted for goals of care.   Clinical Assessment and Goals of Care: Received report from Dr. Eliseo Squires.  Evaluated patient at bedside. She was awake alert oriented and hungry.  She was able to tell me she was in the hospital and expecting surgery for her broken ankle.  We discussed her multiple comorbidities and goals of care.  We discussed the use of the bipap- she was not aware that she could possible die if she did not wear it- she is agreeable to wearing the bipap nightly.  She feels her quality of life is otherwise a quality that she would wish to maintain- wishes for continued life sustaining treatments.  We discussed advanced directives- she would not want to be kept alive artificially long term. We discussed code status- noted she has chronic life limiting illnesses and it is unlikely that full resuscitation efforts would restore her to her current state of functioning and definitely would not improve it better than where she is currently. She is agreeable to DNR- but clearly states she wants all other continued aggressive measures.   Primary Decision Maker PATIENT    SUMMARY OF RECOMMENDATIONS -DNR -Continue current level of care -PMT will check  chart intermittently and readdress goals of care if patient declines -Recommend outpatient followup with Palliative at next venue    Code Status/Advance Care Planning:  DNR  Prognosis:    Unable to determine  Discharge Planning: [facility] for rehab with Palliative care service follow-up  Primary Diagnoses: Present on Admission: . Ankle fracture . End stage renal disease ([place]) . Essential hypertension . Pressure injury of skin   I have reviewed the medical record, interviewed the patient and family, and examined the patient. The following aspects are pertinent.  Past Medical History:  Diagnosis Date  . Anxiety   . Arthritis    osteo  . Chronic kidney disease   . Diabetes mellitus, type 2 (HCC)    Type II  . DVT (deep venous thrombosis) ([place]) 03/2017   left popliteal  . Facet joint disease   . Facet joint disease    foot  . Fibula fracture 02/2017   left  . GERD (gastroesophageal reflux disease)   . Hyperlipidemia   . Hypertension   . OSA (obstructive sleep apnea)    does not use CPAP   . Osteopenia   . Shortness of breath  with exertion  . Venous stasis ulcers ([place])   . Vitamin D deficiency    Social History   Socioeconomic History  . Marital status: Divorced    Spouse name: Not on file  . Number of children: Not on file  . Years of education: Not on file  . Highest education level: Not on file  Occupational History  . Not on file  Tobacco Use  . Smoking status: Former Smoker    Years: 27.00    Types: Cigarettes    Quit date: 10/10/1981    Years since quitting: 38.7  . Smokeless tobacco: Never Used  Vaping Use  . Vaping Use: Never used  Substance and Sexual Activity  . Alcohol use: No  . Drug use: No  . Sexual activity: Not on file  Other Topics Concern  . Not on file  Social History Narrative  . Not on file   Social Determinants of Health   Financial Resource Strain:   . Difficulty of Paying Living Expenses: Not on file   Food Insecurity: No Food Insecurity  . Worried About Charity fundraiser in the Last Year: Never true  . Ran Out of Food in the Last Year: Never true  Transportation Needs: No Transportation Needs  . Lack of Transportation (Medical): No  . Lack of Transportation (Non-Medical): No  Physical Activity:   . Days of Exercise per Week: Not on file  . Minutes of Exercise per Session: Not on file  Stress:   . Feeling of Stress : Not on file  Social Connections:   . Frequency of Communication with Friends and Family: Not on file  . Frequency of Social Gatherings with Friends and Family: Not on file  . Attends Religious Services: Not on file  . Active Member of Clubs or Organizations: Not on file  . Attends Archivist Meetings: Not on file  . Marital Status: Not on file   Family History  Problem Relation Age of Onset  . Leukemia Mother   . Hypertension Brother   . Emphysema Father    Scheduled Meds: . heparin sodium (porcine)      . apixaban  2.5 mg Oral BID  . calcium acetate  667 mg Oral TID with meals  . carbidopa-levodopa  1 tablet Oral BID  . Chlorhexidine Gluconate Cloth  6 each Topical Q0600  . colchicine  0.3 mg Oral Daily  . darbepoetin (ARANESP) injection - DIALYSIS  40 mcg Intravenous Q Wed-HD  . febuxostat  40 mg Oral Daily  . gabapentin  100 mg Oral QHS  . insulin aspart  0-9 Units Subcutaneous TID WC  . insulin NPH Human  10 Units Subcutaneous BID AC & HS  . multivitamin  1 tablet Oral Daily  . oxymetazoline  1 spray Each Nare BID  . pantoprazole  40 mg Oral Daily  . polyethylene glycol  17 g Oral Daily  . rOPINIRole  4 mg Oral BID  . Zinc Oxide   Topical QID   Continuous Infusions: PRN Meds:.acetaminophen **OR** acetaminophen, calcium acetate, levalbuterol, Muscle Rub, ondansetron (ZOFRAN) IV, polyvinyl alcohol, sodium chloride Medications Prior to Admission:  Prior to Admission medications   Medication Sig Start Date End Date Taking? Authorizing  Provider  acetaminophen (TYLENOL) 500 MG tablet Take 1,000-1,500 mg by mouth 3 (three) times daily as needed for moderate pain or headache.    Yes [provider]  albuterol (VENTOLIN HFA) 108 (90 Base) MCG/ACT inhaler Inhale 2 puffs into the lungs  every 6 (six) hours as needed for wheezing or shortness of breath.  03/26/20  Yes [provider]  apixaban (ELIQUIS) 2.5 MG TABS tablet Take 2.5 mg by mouth 2 (two) times daily.   Yes [provider]  calcium acetate (PHOSLO) 667 MG tablet Take 667 mg by mouth See admin instructions. Take one tablet (667 mg) by mouth up to three times daily with meals and with a nutritional shake every night 11/28/19  Yes [provider]  carbidopa-levodopa (SINEMET IR) 10-100 MG tablet Take 1 tablet by mouth in the morning and at bedtime.  11/23/19  Yes [provider]  colchicine 0.6 MG tablet Take 0.6 mg by mouth daily.  02/21/17  Yes [provider]  febuxostat (ULORIC) 40 MG tablet Take 40 mg by mouth daily.   Yes [provider]  gabapentin (NEURONTIN) 300 MG capsule Take 300 mg by mouth 2 (two) times daily.  11/14/19  Yes [provider]  insulin NPH Human (NOVOLIN N) 100 UNIT/ML injection Inject 0.2 mLs (20 Units total) into the skin 2 (two) times daily at 8 am and 10 pm. Patient taking differently: Inject 30-50 Units into the skin See admin instructions. Inject 50 units subcutaneously every morning and 30 units at night 01/25/20  Yes [place] Eric J, DO  insulin regular (NOVOLIN R RELION) 100 units/mL injection Inject 10-20 Units into the skin See admin instructions. Inject 10-25 units subcutaneously 3-4 times daily per sliding scale on chart at home   Yes [provider]  multivitamin (RENA-VIT) TABS tablet Take 1 tablet by mouth daily. 10/09/19  Yes [provider]  omeprazole (PRILOSEC) 20 MG capsule Take 20 mg by mouth daily before breakfast.    Yes [provider]   OXYGEN Inhale 2 L into the lungs as needed (shortness of breath).   Yes [provider]  predniSONE (DELTASONE) 20 MG tablet Take 20 mg by mouth daily. 03/26/20  Yes [provider]  rOPINIRole (REQUIP) 4 MG tablet Take 4 mg by mouth 2 (two) times daily. 05/27/20  Yes [provider]  B-D INS SYR ULTRAFINE 1CC/30G 30G X 1/2" 1 ML MISC 1 each by Other route daily.  12/23/19   [provider]  Blood Glucose Monitoring Suppl ([place]) w/Device KIT  03/23/20   [provider]  doxercalciferol (HECTOROL) 4 MCG/2ML injection Inject 1 mL (2 mcg total) into the vein every Monday, Wednesday, and Friday with hemodialysis. 01/27/20   [place] Eric J, DO  glucose blood test strip 1 each by Other route daily.     [provider]  Lancets [facility] DELICA PLUS [id number]) [place] 1 each by Other route daily.  03/05/19   [provider]  metoprolol succinate (TOPROL-XL) 25 MG 24 hr tablet Take 25 mg by mouth daily. Patient not taking: Reported on 06/14/2020 03/26/20   [provider]  pravastatin (PRAVACHOL) 40 MG tablet Take 1 tablet (40 mg total) by mouth daily at 6 PM. Patient not taking: Reported on 06/14/2020 01/25/20   [place] Donnamarie Poag, DO   No Known Allergies Review of Systems  Constitutional: Positive for activity change.  Respiratory: Negative for shortness of breath.   Musculoskeletal: Positive for gait problem and joint swelling.    Physical Exam Vitals and nursing note reviewed.  Constitutional:      Appearance: She is obese.  Cardiovascular:     Rate and Rhythm: Normal rate.  Pulmonary:     Effort: Pulmonary effort is  normal.  Neurological:     Mental Status: She is alert and oriented to person, place, and time.  Psychiatric:        Behavior: Behavior normal.     Vital Signs: BP 133/72   Pulse 75   Temp 98.3 F (36.8 C)   Resp 20   Ht 5' (1.524 m)   Wt 84 kg   SpO2 95%   BMI 36.17 kg/m  Pain Scale:  0-10 POSS *See Group Information*: S-Acceptable,Sleep, easy to arouse Pain Score: Asleep   SpO2: SpO2: 95 % O2 Device:SpO2: 95 % O2 Flow Rate: .O2 Flow Rate (L/min): 2 L/min  IO: Intake/output summary:   Intake/Output Summary (Last 24 hours) at 06/22/2020 1417 Last data filed at 06/22/2020 1300 Gross per 24 hour  Intake 240 ml  Output 200 ml  Net 40 ml    LBM: Last BM Date: 06/20/20 Baseline Weight: Weight: 99.8 kg Most recent weight: Weight: 84 kg     Palliative Assessment/Data: PPS: 40%     Thank you for this consult. Palliative medicine will continue to follow and assist as needed.    Time Total: 72 minutes Greater than 50%  of this time was spent counseling and coordinating care related to the above assessment and plan.  Signed by: Mariana Kaufman, AGNP-C Palliative Medicine    Please contact Palliative Medicine Team phone at [phone] for questions and concerns.  For individual provider: See Shea Evans

## 2020-06-23 ENCOUNTER — Other Ambulatory Visit: Payer: Self-pay | Admitting: Physician Assistant

## 2020-06-23 DIAGNOSIS — G4733 Obstructive sleep apnea (adult) (pediatric): Secondary | ICD-10-CM

## 2020-06-23 LAB — GLUCOSE, CAPILLARY
Glucose-Capillary: 127 mg/dL — ABNORMAL HIGH (ref 70–99)
Glucose-Capillary: 171 mg/dL — ABNORMAL HIGH (ref 70–99)
Glucose-Capillary: 155 mg/dL — ABNORMAL HIGH (ref 70–99)
Glucose-Capillary: 154 mg/dL — ABNORMAL HIGH (ref 70–99)

## 2020-06-23 MED ORDER — CEFAZOLIN SODIUM-DEXTROSE 2-4 GM/100ML-% IV SOLN
2.0000 g | INTRAVENOUS | Status: AC
  Administered 2020-06-24: 2 g via INTRAVENOUS

## 2020-06-23 MED ORDER — DICLOFENAC SODIUM 1 % EX GEL
4.0000 g | Freq: Four times a day (QID) | CUTANEOUS | Status: DC
  Administered 2020-06-23 – 2020-06-27 (×14): 4 g via TOPICAL
  Filled 2020-06-23: qty 100

## 2020-06-23 MED ORDER — DOCUSATE SODIUM 100 MG PO CAPS
100.0000 mg | ORAL_CAPSULE | Freq: Two times a day (BID) | ORAL | Status: DC | PRN
  Administered 2020-06-23 (×2): 100 mg via ORAL
  Filled 2020-06-23 (×2): qty 1

## 2020-06-23 NOTE — Progress Notes (Signed)
Progress Note    Jean Mcclain  BTD:[id number] DOB: 1946/06/05  DOA: 06/14/2020 PCP: Leanna Battles, MD    Brief Narrative:    Medical records reviewed and are as summarized below: 74 year old female with history of prior DVT on Eliquis, ESRD on HD MWF, IDDM, morbid obesity with a BMI of 40, OSA not on CPAP, hypertension, hyperlipidemia, GERD, nightly oxygen (2L) came into the hospital with worsening left ankle and foot pain with decreased mobility. This has been going on for a while, does not really recall an injury to the foot. Imaging of the left foot in the ED showed transverse mildly displaced fracture to the medial malleolus, subtalar dislocation with lateral angulation of the majority of the foot, lateral subluxation of the navicular at the talonavicular joint. Orthopedic surgery was consulted and she was admitted to the hospital.  Plan for surgery on 9/8 but patient was confused and lethargic at the time.  So surgery was delayed. Per CSW: she only has 14 days left to use for snf so she may need to have surgery before she leaves instead of leaving and going back .    Assessment/Plan:   Principal Problem:   Ankle fracture Active Problems:   Insulin-requiring or dependent type II diabetes mellitus ([place])   Essential hypertension   Pressure injury of skin   End stage renal disease (HCC)   HLD (hyperlipidemia)   Primary osteoarthritis, left ankle and foot   Advanced care planning/counseling discussion   Goals of care, counseling/discussion   Obstructive sleep apnea   Left ankle fracture with deformity -orthopedics consulted -planned for surgery on 9/8 but patient had hypercapnic resp failure -now plan is for surgery next week: Anticipate patient will need discharge to skilled nursing.  Orders are placed for a fracture boot weightbearing as tolerated on the left lower extremity.  We will plan to follow-up in the office after discharge to arrange for surgical intervention.  If  patient is still hospitalized and stable next Wednesday we could proceed with surgery next Wednesday. -per CSW: she only has 14 days left to use for snf so she may need to have surgery before she leaves instead of leaving and going back  -eliquis on hold for surgery  Acute on Chronic respiratory failure with hypercapnia due to  OSA -on 2L [place] at night at home -DOES NOT NEED TO BE 100%- prefer sats between 88-90% -improved on bipap- NEED TO WEAR NIGHTLY WHILE SLEEPING AND DURING NAPS -did not wear on the night of 9/11 and was very sleepy on 9/12 AM, improved with continued bipap- daily re-iteration of how important Bipap is  Type 2 DM -SSI -resume NPH at a lower dose  ESRD -on HD -nephrology consult appreciated  Chronic disease -Transfuse for hemoglobin less 7  Pressure ulcer- POA Pressure Injury 06/14/20 Buttocks Right;Posterior Stage 2 -  Partial thickness loss of dermis presenting as a shallow open injury with a red, pink wound bed without slough. (Active)  06/14/20 2200  Location: Buttocks  Location Orientation: Right;Posterior  Staging: Stage 2 -  Partial thickness loss of dermis presenting as a shallow open injury with a red, pink wound bed without slough.  Wound Description (Comments):   Present on Admission: Yes  -WOC consult: Using no rinse spray, cleanse buttocks and bilateral posterior upper thighs. Apply triple paste.  mattress with low air loss feature.   H/o DVT -holding eliquis for surgery  RLS -on requip  Morbid obesity Body mass index is 38.75  kg/m.    From chart review it appears patient's prednisone was stopped in April   Palliative care consult appreciated for [place]  Family Communication/Anticipated D/C date and plan/Code Status   DVT prophylaxis: restart eliquis after surgery Code Status: Full Code.  Family Communication: spoke with brother 9/11 Disposition Plan: Status is: Inpatient  Remains inpatient appropriate because:Inpatient level of care  appropriate due to severity of illness   Dispo: The patient is from: ILF?               Anticipated d/c is to: SNF              Anticipated d/c date is: after surgery             per CSW: she only has 14 days left to use for snf so she may need to have surgery before she leaves instead of leaving and going back .          Medical Consultants:    Orthopedics  PCCM  Renal  Palliative care     Subjective:   C/o left shoulder pain-- says she knows she has a bad osteoarthritis in her shoulder  Objective:    Vitals:   06/23/20 0300 06/23/20 0400 06/23/20 0729 06/23/20 1230  BP:  (!) 143/68 (!) 124/55 (!) 146/64  Pulse:  79  80  Resp:   18 18  Temp:  97.6 F (36.4 C) 97.6 F (36.4 C) 97.8 F (36.6 C)  TempSrc:  Oral Oral Oral  SpO2: 100% 98% 97% 97%  Weight:  90 kg    Height:        Intake/Output Summary (Last 24 hours) at 06/23/2020 1258 Last data filed at 06/23/2020 0848 Gross per 24 hour  Intake 657 ml  Output 3700 ml  Net -3043 ml   Filed Weights   06/22/20 1453 06/22/20 1850 06/23/20 0400  Weight: 93 kg 89 kg 90 kg    Exam:   General: Appearance:    Obese female in no acute distress     Lungs:     On [place], respirations unlabored  Heart:    Normal heart rate. Normal rhythm. No murmurs, rubs, or gallops.   MS:   All extremities are intact.   Neurologic:   Awake, alert, poor insight into disease processes       Data Reviewed:   I have personally reviewed following labs and imaging studies:  Labs: Labs show the following:   Basic Metabolic Panel: Recent Labs  Lab 06/17/20 1158 06/17/20 1158 06/17/20 1528 06/17/20 1528 06/18/20 0703 06/18/20 0703 06/20/20 0744 06/22/20 1540  NA 136  --  138  --  139  --  140 137  K 4.7   < > 4.8   < > 4.7   < > 4.0 4.1  CL 98  --  98  --  99  --  101 97*  CO2  --   --  31  --  29  --  30 30  GLUCOSE 107*  --  109*  --  91  --  113* 169*  BUN 42*  --  39*  --  51*  --  40* 38*  CREATININE 3.70*   --  4.01*  --  4.74*  --  4.55* 6.08*  CALCIUM  --   --  8.7*  --  8.6*  --  9.0 9.2  PHOS  --   --   --   --  6.3*  --  4.2 5.3*   < > = values in this interval not displayed.   GFR Estimated Creatinine Clearance: 8.1 mL/min (A) (by C-G formula based on SCr of 6.08 mg/dL (H)). Liver Function Tests: Recent Labs  Lab 06/18/20 0703 06/20/20 0744 06/22/20 1540  ALBUMIN 2.3* 2.6* 2.6*   No results for input(s): LIPASE, AMYLASE in the last 168 hours. No results for input(s): AMMONIA in the last 168 hours. Coagulation profile No results for input(s): INR, PROTIME in the last 168 hours.  CBC: Recent Labs  Lab 06/17/20 1158 06/17/20 1528 06/18/20 0703 06/20/20 0744 06/22/20 1540  WBC  --  11.2* 8.1 8.3 10.0  HGB 11.6* 9.9* 9.7* 9.7* 10.1*  HCT 34.0* 34.3* 33.1* 32.4* 34.3*  MCV  --  103.9* 102.8* 101.6* 102.1*  PLT  --  204 188 210 237   Cardiac Enzymes: No results for input(s): CKTOTAL, CKMB, CKMBINDEX, TROPONINI in the last 168 hours. BNP (last 3 results) No results for input(s): PROBNP in the last 8760 hours. CBG: Recent Labs  Lab 06/22/20 0631 06/22/20 1108 06/22/20 2127 06/23/20 0651 06/23/20 1106  GLUCAP 110* 202* 212* 127* 171*   D-Dimer: No results for input(s): DDIMER in the last 72 hours. Hgb A1c: No results for input(s): HGBA1C in the last 72 hours. Lipid Profile: No results for input(s): CHOL, HDL, LDLCALC, TRIG, CHOLHDL, LDLDIRECT in the last 72 hours. Thyroid function studies: No results for input(s): TSH, T4TOTAL, T3FREE, THYROIDAB in the last 72 hours.  Invalid input(s): FREET3 Anemia work up: No results for input(s): VITAMINB12, FOLATE, FERRITIN, TIBC, IRON, RETICCTPCT in the last 72 hours. Sepsis Labs: Recent Labs  Lab 06/17/20 1528 06/18/20 0703 06/20/20 0744 06/22/20 1540  WBC 11.2* 8.1 8.3 10.0    Microbiology Recent Results (from the past 240 hour(s))  SARS Coronavirus 2 by RT PCR (hospital order, performed in [facility] hospital  lab) Nasopharyngeal Nasopharyngeal Swab     Status: None   Collection Time: 06/14/20  4:35 PM   Specimen: Nasopharyngeal Swab  Result Value Ref Range Status   SARS Coronavirus 2 NEGATIVE NEGATIVE Final    Comment: (NOTE) SARS-CoV-2 target nucleic acids are NOT DETECTED.  The SARS-CoV-2 RNA is generally detectable in upper and lower respiratory specimens during the acute phase of infection. The lowest concentration of SARS-CoV-2 viral copies this assay can detect is 250 copies / mL. A negative result does not preclude SARS-CoV-2 infection and should not be used as the sole basis for treatment or other patient management decisions.  A negative result may occur with improper specimen collection / handling, submission of specimen other than nasopharyngeal swab, presence of viral mutation(s) within the areas targeted by this assay, and inadequate number of viral copies (<250 copies / mL). A negative result must be combined with clinical observations, patient history, and epidemiological information.  Fact Sheet for Patients:   StrictlyIdeas.no  Fact Sheet for Healthcare Providers: BankingDealers.co.za  This test is not yet approved or  cleared by the [place] FDA and has been authorized for detection and/or diagnosis of SARS-CoV-2 by FDA under an Emergency Use Authorization (EUA).  This EUA will remain in effect (meaning this test can be used) for the duration of the COVID-19 declaration under Section 564(b)(1) of the Act, 21 U.S.C. section 360bbb-3(b)(1), unless the authorization is terminated or revoked sooner.  Performed at [facility], [place] [address], [place], [place] [address]   Surgical pcr screen     Status: None   Collection Time: 06/14/20  10:50 PM   Specimen: Nasal Mucosa; Nasal Swab  Result Value Ref Range Status   MRSA, PCR NEGATIVE NEGATIVE Final   Staphylococcus aureus NEGATIVE NEGATIVE Final     Comment: (NOTE) The Xpert SA Assay (FDA approved for NASAL specimens in patients 37 years of age and older), is one component of a comprehensive surveillance program. It is not intended to diagnose infection nor to guide or monitor treatment. Performed at [facility], [place] [address], [place], [place] [address]   SARS Coronavirus 2 by RT PCR (hospital order, performed in [facility] hospital lab) Nasopharyngeal Nasopharyngeal Swab     Status: None   Collection Time: 06/19/20  5:13 PM   Specimen: Nasopharyngeal Swab  Result Value Ref Range Status   SARS Coronavirus 2 NEGATIVE NEGATIVE Final    Comment: (NOTE) SARS-CoV-2 target nucleic acids are NOT DETECTED.  The SARS-CoV-2 RNA is generally detectable in upper and lower respiratory specimens during the acute phase of infection. The lowest concentration of SARS-CoV-2 viral copies this assay can detect is 250 copies / mL. A negative result does not preclude SARS-CoV-2 infection and should not be used as the sole basis for treatment or other patient management decisions.  A negative result may occur with improper specimen collection / handling, submission of specimen other than nasopharyngeal swab, presence of viral mutation(s) within the areas targeted by this assay, and inadequate number of viral copies (<250 copies / mL). A negative result must be combined with clinical observations, patient history, and epidemiological information.  Fact Sheet for Patients:   StrictlyIdeas.no  Fact Sheet for Healthcare Providers: BankingDealers.co.za  This test is not yet approved or  cleared by the [place] FDA and has been authorized for detection and/or diagnosis of SARS-CoV-2 by FDA under an Emergency Use Authorization (EUA).  This EUA will remain in effect (meaning this test can be used) for the duration of the COVID-19 declaration under Section 564(b)(1) of the Act, 21  U.S.C. section 360bbb-3(b)(1), unless the authorization is terminated or revoked sooner.  Performed at [facility], [place] [address], [place], [place] [address]     Procedures and diagnostic studies:  No results found.  Medications:   . calcium acetate  667 mg Oral TID with meals  . carbidopa-levodopa  1 tablet Oral BID  . Chlorhexidine Gluconate Cloth  6 each Topical Q0600  . colchicine  0.3 mg Oral Daily  . darbepoetin (ARANESP) injection - DIALYSIS  40 mcg Intravenous Q Wed-HD  . diclofenac Sodium  4 g Topical QID  . febuxostat  40 mg Oral Daily  . gabapentin  100 mg Oral QHS  . insulin aspart  0-9 Units Subcutaneous TID WC  . insulin NPH Human  10 Units Subcutaneous BID AC & HS  . multivitamin  1 tablet Oral Daily  . oxymetazoline  1 spray Each Nare BID  . pantoprazole  40 mg Oral Daily  . rOPINIRole  4 mg Oral BID  . Zinc Oxide   Topical QID   Continuous Infusions: . [START ON 06/24/2020]  ceFAZolin (ANCEF) IV       LOS: 9 days   Geradine Girt  [facility]   How to contact the [facility] Attending or Consulting provider [place] or covering provider during after hours [place], for this patient?  1. Check the care team in [facility] and look for a) attending/consulting TRH provider listed and b) the [facility] team listed 2. Log into [email or web address] and use  [place]'s universal password to access. If you do not have the password, please contact the hospital operator. 3. Locate the [facility] provider you are looking for under [facility] and page to a number that you can be directly reached. 4. If you still have difficulty reaching the provider, please page the [facility] (Director on Call) for the Hospitalists listed on amion for assistance.  06/23/2020, 12:58 PM

## 2020-06-23 NOTE — Progress Notes (Signed)
Nephrology Follow-Up Consult note   Assessment/Recommendations: Jean Mcclain is a/an 74 y.o. female with a past medical history significant for DVT on eliquis, ESRD, DM, obesity, OSA< HTN, HLD, admitted for ankle pain.     Dialysis: [facility] MWF  4h 18min  90.5kg  2/2 bath  AVF  15g  Hep 3000 Hectorol 2 mcg IV q HD  1. 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 Sharol Given, planned for tibial talar calcaneal fusion but pt's resp status declined so surgery was postponed. Plan for surgery tomorrow 2. A/C resp failure w/ hypercapnia d/t OSA/ OHS: Bipap nightly. Continue to challenge EDW. 3. ESRD:usual HD is MWF.  HD per schedule 4. BP/ volume:  BP acceptable. Challenging EDW 5. Anemia:Hgb 10.1, we started esa here 40 ug on 9/09 6. Hx of DVT: perioperative anticoagulation per priamry 7. Metabolic bone disease:Continue hecterol and phos binder with meals 8. Nutrition:Renal/carb modified diet.  9. T2DM: On insulin, per primary team   Recommendations conveyed to primary service.    [facility] 06/23/2020 9:49 AM  ___________________________________________________________  CC: ESRD  Interval History/Subjective: Patient feels well without specific complaints.  Tolerated dialysis yesterday with -3.5 L.   Medications:  Current Facility-Administered Medications  Medication Dose Route Frequency Provider Last Rate Last Admin  . acetaminophen (TYLENOL) tablet 650 mg  650 mg Oral Q6H PRN Shela Leff, MD   650 mg at 06/21/20 2218   Or  . acetaminophen (TYLENOL) suppository 650 mg  650 mg Rectal Q6H PRN Shela Leff, MD      . calcium acetate (PHOSLO) capsule 667 mg  667 mg Oral TID with meals Caren Griffins, MD   667 mg at 06/23/20 0829  . calcium acetate (PHOSLO) capsule 667 mg  667 mg Oral Daily PRN Blenda Nicely, RPH      . carbidopa-levodopa (SINEMET IR) 10-100 MG per tablet immediate release  1 tablet  1 tablet Oral BID Caren Griffins, MD   1 tablet at 06/23/20 2778  . Chlorhexidine Gluconate Cloth 2 % PADS 6 each  6 each Topical Q0600 Janalee Dane, PA-C   6 each at 06/23/20 [phone]  . colchicine tablet 0.3 mg  0.3 mg Oral Daily Eulogio Bear U, DO   0.3 mg at 06/23/20 0831  . Darbepoetin Alfa (ARANESP) injection 40 mcg  40 mcg Intravenous Q Wed-HD Collins, Samantha G, PA-C      . diclofenac Sodium (VOLTAREN) 1 % topical gel 4 g  4 g Topical QID Vann, Jessica U, DO      . febuxostat (ULORIC) tablet 40 mg  40 mg Oral Daily Caren Griffins, MD   40 mg at 06/23/20 0834  . gabapentin (NEURONTIN) capsule 100 mg  100 mg Oral QHS Vann, Jessica U, DO   100 mg at 06/22/20 2046  . insulin aspart (novoLOG) injection 0-9 Units  0-9 Units Subcutaneous TID WC Vann, Jessica U, DO   1 Units at 06/23/20 0700  . insulin NPH Human (NOVOLIN N) injection 10 Units  10 Units Subcutaneous BID AC & HS Eulogio Bear U, DO   10 Units at 06/23/20 0835  . levalbuterol (XOPENEX) nebulizer solution 0.63 mg  0.63 mg Nebulization Q8H PRN Lovey Newcomer T, NP   0.63 mg at 06/15/20 0324  . multivitamin (RENA-VIT) tablet 1 tablet  1 tablet Oral Daily Caren Griffins, MD   1 tablet at 06/23/20 0830  . Muscle Rub CREA  Topical PRN Eulogio Bear U, DO      . ondansetron [facility]) injection 4 mg  4 mg Intravenous Q6H PRN Caren Griffins, MD   4 mg at 06/20/20 1428  . oxymetazoline (AFRIN) 0.05 % nasal spray 1 spray  1 spray Each Nare BID Eulogio Bear U, DO   1 spray at 06/23/20 0838  . pantoprazole (PROTONIX) EC tablet 40 mg  40 mg Oral Daily Caren Griffins, MD   40 mg at 06/23/20 0830  . polyethylene glycol (MIRALAX / GLYCOLAX) packet 17 g  17 g Oral Daily Caren Griffins, MD   17 g at 06/22/20 0924  . polyvinyl alcohol (LIQUIFILM TEARS) 1.4 % ophthalmic solution 1 drop  1 drop Both Eyes PRN Vann, Jessica U, DO      . rOPINIRole (REQUIP) tablet 4 mg  4 mg Oral BID Caren Griffins, MD   4 mg at 06/23/20  [id number]  . sodium chloride (OCEAN) 0.65 % nasal spray 1 spray  1 spray Each Nare PRN Eulogio Bear U, DO   1 spray at 06/23/20 0848  . Zinc Oxide (TRIPLE PASTE) 12.8 % ointment   Topical QID Caren Griffins, MD   1 application at 89/38/10 2315   Facility-Administered Medications Ordered in Other Encounters  Medication Dose Route Frequency Provider Last Rate Last Admin  . bupivacaine (PF) (MARCAINE) 0.25 % injection   Other Anesthesia Intra-op Duane Boston, MD   15 mL at 06/17/20 1107  . bupivacaine liposome (EXPAREL) 1.3 % injection   Peri-NEURAL Anesthesia Intra-op Duane Boston, MD   10 mL at 06/17/20 1107      Review of Systems: 10 systems reviewed and negative except per interval history/subjective  Physical Exam: Vitals:   06/23/20 0400 06/23/20 0729  BP: (!) 143/68 (!) 124/55  Pulse: 79   Resp:  18  Temp: 97.6 F (36.4 C) 97.6 F (36.4 C)  SpO2: 98% 97%   Total I/O In: 177 [P.O.:177] Out: -   Intake/Output Summary (Last 24 hours) at 06/23/2020 0949 Last data filed at 06/23/2020 0848 Gross per 24 hour  Intake 657 ml  Output 3700 ml  Net -3043 ml   Constitutional: chronically ill appearing no distress ENMT: ears and nose without scars or lesions, MMM CV: normal rate, no edema Respiratory: Bilateral chest rise, mild iwob Gastrointestinal: soft, non-tender, no palpable masses or hernias Skin: seborrhea on nose and cheeks, otherwise no visible lesions or rashes Psych: alert, judgement/insight appropriate, appropriate mood and affect   Test Results I personally reviewed new and old clinical labs and radiology tests Lab Results  Component Value Date   NA 137 06/22/2020   K 4.1 06/22/2020   CL 97 (L) 06/22/2020   CO2 30 06/22/2020   BUN 38 (H) 06/22/2020   CREATININE 6.08 (H) 06/22/2020   CALCIUM 9.2 06/22/2020   ALBUMIN 2.6 (L) 06/22/2020   PHOS 5.3 (H) 06/22/2020

## 2020-06-23 NOTE — Anesthesia Preprocedure Evaluation (Addendum)
Anesthesia Evaluation  Patient identified by MRN, date of birth, ID band Patient awake    Reviewed: Allergy & Precautions, NPO status , Patient's Chart, lab work & pertinent test results, reviewed documented beta blocker date and time   History of Anesthesia Complications Negative for: history of anesthetic complications  Airway Mallampati: II  TM Distance: >3 FB Neck ROM: Full    Dental  (+) Poor Dentition, Partial Lower, Chipped, Caps, Dental Advisory Given   Pulmonary shortness of breath, sleep apnea (does not wear CPAP) and Continuous Positive Airway Pressure Ventilation , COPD,  COPD inhaler, former smoker,  06/19/2020 SARS coronavirus NEG   breath sounds clear to auscultation       Cardiovascular hypertension, Pt. on medications and Pt. on home beta blockers (-) angina+ DVT   Rhythm:Regular Rate:Normal  '18 ECHO: EF 60-65%, septal hypertrophy, mild MS, mean grad 6 mmHg   Neuro/Psych Anxiety parkinson's    GI/Hepatic Neg liver ROS, GERD  Controlled,  Endo/Other  diabetes (glu 298), Insulin DependentMorbid obesity  Renal/GU Dialysis and ESRFRenal disease (dialyzed yesterday)     Musculoskeletal  (+) Arthritis ,   Abdominal (+) + obese,   Peds  Hematology  (+) Blood dyscrasia (Hb 10.1), anemia , eliquis   Anesthesia Other Findings   Reproductive/Obstetrics                            Anesthesia Physical Anesthesia Plan  ASA: III  Anesthesia Plan: General   Post-op Pain Management: GA combined w/ Regional for post-op pain   Induction:   PONV Risk Score and Plan: 3 and Ondansetron, Dexamethasone and Treatment may vary due to age or medical condition  Airway Management Planned: Oral ETT  Additional Equipment: None  Intra-op Plan:   Post-operative Plan: Extubation in OR  Informed Consent: I have reviewed the patients History and Physical, chart, labs and discussed the procedure  including the risks, benefits and alternatives for the proposed anesthesia with the patient or authorized representative who has indicated his/her understanding and acceptance.   Patient has DNR.  Discussed DNR with patient and Suspend DNR.   Dental advisory given  Plan Discussed with: CRNA and Surgeon  Anesthesia Plan Comments: (Plan routine monitors, GETA with adductor canal and popliteal blocks for post op analgesia)       Anesthesia Quick Evaluation

## 2020-06-23 NOTE — Progress Notes (Signed)
Occupational Therapy Treatment Patient Details Name: Jean Mcclain MRN: [id number] DOB: [birth year] Today's Date: 06/23/2020    History of present illness 74 year old female with history of prior DVT on Eliquis, ESRD on HD MWF, IDDM, morbid obesity with a BMI of 40, OSA not on CPAP, hypertension, hyperlipidemia, GERD, nightly oxygen (2L) came into the hospital with worsening left ankle and foot pain with decreased mobility. This has been going on for a while, does not really recall an injury to the foot. Imaging of the left foot in the ED showed transverse mildly displaced fracture to the medial malleolus, subtalar dislocation with lateral angulation of the majority of the foot, lateral subluxation of the navicular at the talonavicular joint. Orthopedic surgery was consulted and she was admitted to the hospital.  Plan for surgery on 9/8 but patient was confused and lethargic at times; pt scheduled for surgery 9/15   OT comments  Focus of session on ROM/strengthening B UEs within shoulder pain tolerance, grooming and self feeding at bed level. Instructed pt to ask staff to pull her up in bed for optimal positioning and placement of pillow under her R elbow to assist with self feeding and grooming. Issued foam build ups for eating utensils, toothbrush. Updated goals. Pt continues to be appropriate for SNF level rehab.  Follow Up Recommendations  SNF    Equipment Recommendations  Other (comment) (defer to next venue)    Recommendations for Other Services      Precautions / Restrictions Precautions Precautions: Fall Required Braces or Orthoses: Other Brace Other Brace: CAM boot LLE       Mobility Bed Mobility Overal bed mobility: Needs Assistance             General bed mobility comments: +2 total assist for pulling up in bed for self feeding and grooming  Transfers                      Balance                                           ADL either  performed or assessed with clinical judgement   ADL Overall ADL's : Needs assistance/impaired Eating/Feeding: Set up;Sitting Eating/Feeding Details (indicate cue type and reason): issued foam build up for utensils, instructed pt to have staff position her for optimal self feeding Grooming: Moderate assistance;Wash/dry face;Wash/dry hands;Bed level                                 General ADL Comments: Pt declining sitting EOB.     Vision       Perception     Praxis      Cognition Arousal/Alertness: Awake/alert Behavior During Therapy: WFL for tasks assessed/performed Overall Cognitive Status: Within Functional Limits for tasks assessed                                          Exercises Exercises: General Upper Extremity General Exercises - Upper Extremity Shoulder Flexion: AAROM;Both;10 reps;Supine Shoulder ABduction: AAROM;Both;10 reps;Supine Shoulder ADduction: AAROM;Both;10 reps;Supine Shoulder Horizontal ABduction: AAROM;Both;10 reps;Supine Shoulder Horizontal ADduction: AAROM;Both;10 reps;Supine Elbow Flexion: Strengthening;Both;10 reps;Supine;Bar weights/barbell Bar Weights/Barbell (Elbow Flexion): 1 lb Elbow Extension: Strengthening;Both;10 reps;Supine;Bar Conservation officer, historic buildings (  Elbow Extension): 1 lb Wrist Flexion: AROM;Both;10 reps;Supine Wrist Extension: AROM;Both;10 reps;Supine   Shoulder Instructions       General Comments      Pertinent Vitals/ Pain       Pain Assessment: Faces Faces Pain Scale: Hurts even more Pain Location: shoulders with ROM end range Pain Descriptors / Indicators: Aching;Grimacing;Guarding Pain Intervention(s): Monitored during session;Repositioned  Home Living                                          Prior Functioning/Environment              Frequency  Min 2X/week        Progress Toward Goals  OT Goals(current goals can now be found in the care plan  section)  Progress towards OT goals: Progressing toward goals  Acute Rehab OT Goals Patient Stated Goal: "to get rid of the sores on my bottom OT Goal Formulation: With patient Time For Goal Achievement: 07/02/20 Potential to Achieve Goals: Good  Plan Discharge plan remains appropriate    Co-evaluation                 AM-PAC OT "6 Clicks" Daily Activity     Outcome Measure   Help from another person eating meals?: A Little Help from another person taking care of personal grooming?: A Lot Help from another person toileting, which includes using toliet, bedpan, or urinal?: Total Help from another person bathing (including washing, rinsing, drying)?: Total Help from another person to put on and taking off regular upper body clothing?: Total Help from another person to put on and taking off regular lower body clothing?: Total 6 Click Score: 9    End of Session    OT Visit Diagnosis: Unsteadiness on feet (R26.81);Other abnormalities of gait and mobility (R26.89);Muscle weakness (generalized) (M62.81);Pain   Activity Tolerance Patient tolerated treatment well   Patient Left in bed;with call bell/phone within reach   Nurse Communication Other (comment) (NT to help pt position to eat and use foam build up)        Time: 9507-2257 OT Time Calculation (min): 23 min  Charges: OT General Charges $OT Visit: 1 Visit OT Treatments $Self Care/Home Management : 8-22 mins $Therapeutic Exercise: 8-22 mins  Nestor Lewandowsky, OTR/L Acute Rehabilitation Services Pager: [phone] Office: [phone]   Jean Mcclain 06/23/2020, 3:34 PM

## 2020-06-23 NOTE — Progress Notes (Signed)
Physical Therapy Treatment Patient Details Name: Jean Mcclain MRN: [id number] DOB: Mar 28, 1946 Today's Date: 06/23/2020    History of Present Illness 74 year old female with history of prior DVT on Eliquis, ESRD on HD MWF, IDDM, morbid obesity with a BMI of 40, OSA not on CPAP, hypertension, hyperlipidemia, GERD, nightly oxygen (2L) came into the hospital with worsening left ankle and foot pain with decreased mobility. This has been going on for a while, does not really recall an injury to the foot. Imaging of the left foot in the ED showed transverse mildly displaced fracture to the medial malleolus, subtalar dislocation with lateral angulation of the majority of the foot, lateral subluxation of the navicular at the talonavicular joint. Orthopedic surgery was consulted and she was admitted to the hospital.  Plan for surgery on 9/8 but patient was confused and lethargic at times; pt scheduled for surgery 9/15    PT Comments    Pt demonstrating improved alertness and orientation during session; pt continues to have difficulty with all bed mobility due to overall weakness and poor endurance; pt able to tolerate sitting EOB with UE support; pt continues to demonstrate deficits in balance, strength, coordination, endurance and safety and will benefit from skilled PT to address deficits to maximize independence with functional mobility prior to discharge.     Follow Up Recommendations  SNF;Supervision/Assistance - 24 hour     Equipment Recommendations       Recommendations for Other Services       Precautions / Restrictions Precautions Precautions: Fall Required Braces or Orthoses: Other Brace Other Brace: CAM boot LLE Restrictions Weight Bearing Restrictions: Yes LLE Weight Bearing: Weight bearing as tolerated Other Position/Activity Restrictions: needs to be in L CAM boot for WBAT    Mobility  Bed Mobility Overal bed mobility: Needs Assistance Bed Mobility: Supine to Sit;Sit to  Supine     Supine to sit: Mod assist Sit to supine: Max assist   General bed mobility comments: pt requiring total assist and increased time to scoot up in bed due to poor rolling and use of UEs due to shoulder pain  Transfers                    Ambulation/Gait                 Stairs             Wheelchair Mobility    Modified Rankin (Stroke Patients Only)       Balance Overall balance assessment: Needs assistance Sitting-balance support: Bilateral upper extremity supported Sitting balance-Leahy Scale: Poor Sitting balance - Comments: pt sitting EOB x 5 minutes with verbal cueing for breathing                                    Cognition Arousal/Alertness: Awake/alert Behavior During Therapy: WFL for tasks assessed/performed Overall Cognitive Status: Within Functional Limits for tasks assessed Area of Impairment: Safety/judgement;Problem solving;Following commands                       Following Commands: Follows one step commands with increased time Safety/Judgement: Decreased awareness of deficits   Problem Solving: Slow processing;Decreased initiation;Difficulty sequencing;Requires verbal cues;Requires tactile cues General Comments: pt continues to require increased cueing for safety and sequencing; pt oriented and aware of surgery tomorrow      Exercises General Exercises - Lower Extremity Quad Sets:  Both;AROM;20 reps Gluteal Sets: Both;AROM;20 reps Heel Slides: AROM;Right;20 reps;AAROM;Left Hip ABduction/ADduction: AROM;Right;20 reps;AAROM;Left    General Comments        Pertinent Vitals/Pain Pain Assessment: 0-10 Pain Score: 4  Faces Pain Scale: Hurts a little bit Pain Location: left LE; only when moving it    Home Living Family/patient expects to be discharged to:: Skilled nursing facility               Additional Comments: pt states she has been sleeping in a chair and has someone assist her to  standing to shuffle to motorized WC/scooter; does not walk    Prior Function Level of Independence: Needs assistance  Gait / Transfers Assistance Needed: pt states she has people help her from the chair<>WC       PT Goals (current goals can now be found in the care plan section) Acute Rehab PT Goals Patient Stated Goal: "to get rid of the sores on my bottom PT Goal Formulation: With patient Time For Goal Achievement: 07/02/20 Potential to Achieve Goals: Fair Progress towards PT goals: Progressing toward goals    Frequency    Min 2X/week      PT Plan Current plan remains appropriate    Co-evaluation              AM-PAC PT "6 Clicks" Mobility   Outcome Measure  Help needed turning from your back to your side while in a flat bed without using bedrails?: A Lot Help needed moving from lying on your back to sitting on the side of a flat bed without using bedrails?: A Lot Help needed moving to and from a bed to a chair (including a wheelchair)?: A Lot Help needed standing up from a chair using your arms (e.g., wheelchair or bedside chair)?: A Lot Help needed to walk in hospital room?: Total Help needed climbing 3-5 steps with a railing? : Total 6 Click Score: 10    End of Session Equipment Utilized During Treatment: Oxygen Activity Tolerance: Patient limited by fatigue Patient left: in bed;with call bell/phone within reach Nurse Communication: Mobility status PT Visit Diagnosis: Muscle weakness (generalized) (M62.81);History of falling (Z91.81);Other abnormalities of gait and mobility (R26.89) Pain - Right/Left: Left Pain - part of body: Leg     Time: 6803-2122 PT Time Calculation (min) (ACUTE ONLY): 30 min  Charges:  $Therapeutic Exercise: 8-22 mins $Therapeutic Activity: 8-22 mins                     Lyanne Co, DPT Acute Rehabilitation Services [phone]   [place] 06/23/2020, 10:36 AM

## 2020-06-23 NOTE — Progress Notes (Signed)
Visited with patient today.  She is alert and responds to questions appropriately.  She understands why surgery was canceled last week.  I rediscussed with her the purpose of surgery she would like to still go forward with surgery tomorrow.  We will plan for left tibial calcaneal fusion on Wednesday

## 2020-06-24 ENCOUNTER — Encounter (HOSPITAL_COMMUNITY): Payer: Self-pay | Admitting: Internal Medicine

## 2020-06-24 ENCOUNTER — Encounter (HOSPITAL_COMMUNITY)
Admission: EM | Disposition: A | Discharge status: Skilled Nursing Facility | Payer: Self-pay | Source: Home / Self Care | Attending: Internal Medicine

## 2020-06-24 ENCOUNTER — Inpatient Hospital Stay (HOSPITAL_COMMUNITY): Payer: HMO | Admitting: Anesthesiology

## 2020-06-24 DIAGNOSIS — J9622 Acute and chronic respiratory failure with hypercapnia: Secondary | ICD-10-CM

## 2020-06-24 DIAGNOSIS — J9621 Acute and chronic respiratory failure with hypoxia: Secondary | ICD-10-CM

## 2020-06-24 HISTORY — PX: ANKLE FUSION: SHX5718

## 2020-06-24 LAB — GLUCOSE, CAPILLARY
Glucose-Capillary: 298 mg/dL — ABNORMAL HIGH (ref 70–99)
Glucose-Capillary: 131 mg/dL — ABNORMAL HIGH (ref 70–99)
Glucose-Capillary: 119 mg/dL — ABNORMAL HIGH (ref 70–99)
Glucose-Capillary: 173 mg/dL — ABNORMAL HIGH (ref 70–99)
Glucose-Capillary: 119 mg/dL — ABNORMAL HIGH (ref 70–99)

## 2020-06-24 SURGERY — ANKLE FUSION
Anesthesia: General | Site: Ankle | Laterality: Left

## 2020-06-24 MED ORDER — PHENYLEPHRINE 40 MCG/ML (10ML) SYRINGE FOR IV PUSH (FOR BLOOD PRESSURE SUPPORT)
PREFILLED_SYRINGE | INTRAVENOUS | Status: AC
  Filled 2020-06-24: qty 10

## 2020-06-24 MED ORDER — SODIUM CHLORIDE 0.9 % IV SOLN: INTRAVENOUS | Status: DC | PRN

## 2020-06-24 MED ORDER — ROCURONIUM BROMIDE 10 MG/ML (PF) SYRINGE
PREFILLED_SYRINGE | INTRAVENOUS | Status: AC
  Filled 2020-06-24: qty 10

## 2020-06-24 MED ORDER — 0.9 % SODIUM CHLORIDE (POUR BTL) OPTIME
TOPICAL | Status: DC | PRN
  Administered 2020-06-24: 1000 mL

## 2020-06-24 MED ORDER — ALBUMIN HUMAN 5 % IV SOLN: 12.5000 g | Freq: Once | INTRAVENOUS | Status: AC

## 2020-06-24 MED ORDER — PHENYLEPHRINE 40 MCG/ML (10ML) SYRINGE FOR IV PUSH (FOR BLOOD PRESSURE SUPPORT)
PREFILLED_SYRINGE | INTRAVENOUS | Status: DC | PRN
  Administered 2020-06-24 (×5): 80 ug via INTRAVENOUS

## 2020-06-24 MED ORDER — EPHEDRINE 5 MG/ML INJ
INTRAVENOUS | Status: AC
  Filled 2020-06-24: qty 10

## 2020-06-24 MED ORDER — FENTANYL CITRATE (PF) 100 MCG/2ML IJ SOLN
INTRAMUSCULAR | Status: AC
  Administered 2020-06-24: 25 ug via INTRAVENOUS
  Filled 2020-06-24: qty 2

## 2020-06-24 MED ORDER — SUGAMMADEX SODIUM 200 MG/2ML IV SOLN
INTRAVENOUS | Status: DC | PRN
  Administered 2020-06-24: 100 mg via INTRAVENOUS
  Administered 2020-06-24: 200 mg via INTRAVENOUS

## 2020-06-24 MED ORDER — ROCURONIUM BROMIDE 10 MG/ML (PF) SYRINGE
PREFILLED_SYRINGE | INTRAVENOUS | Status: DC | PRN
  Administered 2020-06-24: 50 mg via INTRAVENOUS

## 2020-06-24 MED ORDER — FENTANYL CITRATE (PF) 100 MCG/2ML IJ SOLN: 25.0000 ug | INTRAMUSCULAR | Status: DC | PRN

## 2020-06-24 MED ORDER — MEPERIDINE HCL 25 MG/ML IJ SOLN: 6.2500 mg | INTRAMUSCULAR | Status: DC | PRN

## 2020-06-24 MED ORDER — FENTANYL CITRATE (PF) 250 MCG/5ML IJ SOLN
INTRAMUSCULAR | Status: DC | PRN
Start: 2020-06-24 — End: 2020-06-24
  Administered 2020-06-24: 25 ug via INTRAVENOUS

## 2020-06-24 MED ORDER — PHENYLEPHRINE HCL-NACL 10-0.9 MG/250ML-% IV SOLN
INTRAVENOUS | Status: DC | PRN
  Administered 2020-06-24: 80 ug/min via INTRAVENOUS

## 2020-06-24 MED ORDER — DOCUSATE SODIUM 100 MG PO CAPS
100.0000 mg | ORAL_CAPSULE | Freq: Two times a day (BID) | ORAL | Status: DC
  Administered 2020-06-24 – 2020-06-27 (×6): 100 mg via ORAL
  Filled 2020-06-24 (×6): qty 1

## 2020-06-24 MED ORDER — LIDOCAINE 2% (20 MG/ML) 5 ML SYRINGE
INTRAMUSCULAR | Status: DC | PRN
  Administered 2020-06-24: 20 mg via INTRAVENOUS

## 2020-06-24 MED ORDER — HYDROMORPHONE HCL 1 MG/ML IJ SOLN
0.5000 mg | INTRAMUSCULAR | Status: DC | PRN
  Administered 2020-06-24 – 2020-06-27 (×4): 0.5 mg via INTRAVENOUS
  Filled 2020-06-24 (×6): qty 0.5

## 2020-06-24 MED ORDER — OXYCODONE HCL 5 MG PO TABS: 5.0000 mg | ORAL_TABLET | Freq: Once | ORAL | Status: DC | PRN

## 2020-06-24 MED ORDER — EPHEDRINE SULFATE-NACL 50-0.9 MG/10ML-% IV SOSY
PREFILLED_SYRINGE | INTRAVENOUS | Status: DC | PRN
  Administered 2020-06-24 (×2): 10 mg via INTRAVENOUS

## 2020-06-24 MED ORDER — MIDAZOLAM HCL 2 MG/2ML IJ SOLN
INTRAMUSCULAR | Status: DC | PRN
  Administered 2020-06-24: .5 mg via INTRAVENOUS
  Administered 2020-06-24: 1 mg via INTRAVENOUS

## 2020-06-24 MED ORDER — CEFAZOLIN SODIUM-DEXTROSE 2-4 GM/100ML-% IV SOLN
INTRAVENOUS | Status: AC
  Filled 2020-06-24: qty 100

## 2020-06-24 MED ORDER — OXYCODONE HCL 5 MG/5ML PO SOLN: 5.0000 mg | Freq: Once | ORAL | Status: DC | PRN

## 2020-06-24 MED ORDER — PROPOFOL 10 MG/ML IV BOLUS
INTRAVENOUS | Status: DC | PRN
  Administered 2020-06-24: 100 mg via INTRAVENOUS

## 2020-06-24 MED ORDER — PROMETHAZINE HCL 25 MG/ML IJ SOLN: 6.2500 mg | INTRAMUSCULAR | Status: DC | PRN

## 2020-06-24 MED ORDER — ALBUMIN HUMAN 5 % IV SOLN
INTRAVENOUS | Status: AC
  Administered 2020-06-24: 12.5 g
  Filled 2020-06-24: qty 250

## 2020-06-24 MED ORDER — BUPIVACAINE-EPINEPHRINE (PF) 0.5% -1:200000 IJ SOLN
INTRAMUSCULAR | Status: DC | PRN
  Administered 2020-06-24: 30 mL via PERINEURAL

## 2020-06-24 MED ORDER — MIDAZOLAM HCL 2 MG/2ML IJ SOLN: 0.5000 mg | Freq: Once | INTRAMUSCULAR | Status: DC | PRN

## 2020-06-24 MED ORDER — OXYCODONE HCL 5 MG PO TABS
5.0000 mg | ORAL_TABLET | ORAL | Status: DC | PRN
  Administered 2020-06-24 – 2020-06-27 (×8): 5 mg via ORAL
  Filled 2020-06-24 (×7): qty 1

## 2020-06-24 MED ORDER — LIDOCAINE 2% (20 MG/ML) 5 ML SYRINGE
INTRAMUSCULAR | Status: AC
  Filled 2020-06-24: qty 5

## 2020-06-24 MED ORDER — ONDANSETRON HCL 4 MG/2ML IJ SOLN
INTRAMUSCULAR | Status: AC
  Filled 2020-06-24: qty 2

## 2020-06-24 MED ORDER — MIDAZOLAM HCL 2 MG/2ML IJ SOLN
INTRAMUSCULAR | Status: AC
  Filled 2020-06-24: qty 2

## 2020-06-24 MED ORDER — CEFAZOLIN SODIUM-DEXTROSE 1-4 GM/50ML-% IV SOLN
1.0000 g | Freq: Two times a day (BID) | INTRAVENOUS | Status: AC
  Administered 2020-06-24: 1 g via INTRAVENOUS
  Filled 2020-06-24: qty 50

## 2020-06-24 MED ORDER — ROPIVACAINE HCL 7.5 MG/ML IJ SOLN
INTRAMUSCULAR | Status: DC | PRN
  Administered 2020-06-24: 20 mL via PERINEURAL

## 2020-06-24 MED ORDER — FENTANYL CITRATE (PF) 250 MCG/5ML IJ SOLN
INTRAMUSCULAR | Status: AC
  Filled 2020-06-24: qty 5

## 2020-06-24 MED ORDER — ENSURE PRE-SURGERY PO LIQD
296.0000 mL | Freq: Once | ORAL | Status: AC
  Administered 2020-06-24: 296 mL via ORAL
  Filled 2020-06-24: qty 296

## 2020-06-24 MED ORDER — PROPOFOL 10 MG/ML IV BOLUS
INTRAVENOUS | Status: AC
  Filled 2020-06-24: qty 20

## 2020-06-24 MED ORDER — SODIUM CHLORIDE 0.9 % IV SOLN: INTRAVENOUS | Status: DC

## 2020-06-24 SURGICAL SUPPLY — 54 items
BANDAGE ESMARK 6X9 LF (GAUZE/BANDAGES/DRESSINGS) ×1 IMPLANT
BIT DRILL CANNULATED 4.6 (BIT) ×1 IMPLANT
BIT DRILL LONG 3.1X160 (DRILL) IMPLANT
BIT DRILL SOLID LONG 2.8X160 (DRILL) IMPLANT
BLADE AVERAGE 25X9 (BLADE) ×2 IMPLANT
BLADE SAW SGTL MED 73X18.5 STR (BLADE) ×2 IMPLANT
BLADE SURG 10 STRL SS (BLADE) IMPLANT
BNDG CMPR 9X6 STRL LF SNTH (GAUZE/BANDAGES/DRESSINGS) ×1
BNDG COHESIVE 4X5 TAN STRL (GAUZE/BANDAGES/DRESSINGS) ×2 IMPLANT
BNDG ESMARK 6X9 LF (GAUZE/BANDAGES/DRESSINGS) ×2
BNDG GAUZE ELAST 4 BULKY (GAUZE/BANDAGES/DRESSINGS) ×4 IMPLANT
COVER MAYO STAND STRL (DRAPES) IMPLANT
COVER SURGICAL LIGHT HANDLE (MISCELLANEOUS) ×4 IMPLANT
COVER WAND RF STERILE (DRAPES) ×2 IMPLANT
DRAPE DERMATAC (DRAPES) ×2 IMPLANT
DRAPE OEC MINIVIEW 54X84 (DRAPES) IMPLANT
DRAPE U-SHAPE 47X51 STRL (DRAPES) ×2 IMPLANT
DRILL LONG 3.1X160 (DRILL) ×2
DRILL SOLID LONG 2.8X160 (DRILL) ×2
DRSG ADAPTIC 3X8 NADH LF (GAUZE/BANDAGES/DRESSINGS) ×2 IMPLANT
DURAPREP 26ML APPLICATOR (WOUND CARE) ×2 IMPLANT
ELECT REM PT RETURN 9FT ADLT (ELECTROSURGICAL) ×2
ELECTRODE REM PT RTRN 9FT ADLT (ELECTROSURGICAL) ×1 IMPLANT
GAUZE SPONGE 4X4 12PLY STRL (GAUZE/BANDAGES/DRESSINGS) ×2 IMPLANT
GLOVE BIOGEL PI IND STRL 9 (GLOVE) ×1 IMPLANT
GLOVE BIOGEL PI INDICATOR 9 (GLOVE) ×1
GLOVE SURG ORTHO 9.0 STRL STRW (GLOVE) ×2 IMPLANT
GOWN STRL REUS W/ TWL LRG LVL3 (GOWN DISPOSABLE) ×1 IMPLANT
GOWN STRL REUS W/ TWL XL LVL3 (GOWN DISPOSABLE) ×1 IMPLANT
GOWN STRL REUS W/TWL LRG LVL3 (GOWN DISPOSABLE) ×2
GOWN STRL REUS W/TWL XL LVL3 (GOWN DISPOSABLE) ×2
K-WIRE SINGLE TROCAR 2.3X230 (WIRE) ×2
KIT BASIN OR (CUSTOM PROCEDURE TRAY) ×2 IMPLANT
KIT TURNOVER KIT B (KITS) ×2 IMPLANT
KWIRE SINGLE TROCAR 2.3X230 (WIRE) IMPLANT
NS IRRIG 1000ML POUR BTL (IV SOLUTION) ×2 IMPLANT
PACK ORTHO EXTREMITY (CUSTOM PROCEDURE TRAY) ×2 IMPLANT
PAD ARMBOARD 7.5X6 YLW CONV (MISCELLANEOUS) ×4 IMPLANT
PLATE ANT TTC FLAT LT (Plate) ×1 IMPLANT
PREVENA RESTOR AXIOFORM 29X28 (GAUZE/BANDAGES/DRESSINGS) ×1 IMPLANT
SCREW CANN HDLS THRD 7X60 (Screw) ×1 IMPLANT
SCREW CANN HDLS THRD 7X70 (Screw) ×1 IMPLANT
SCREW LOCK PLATE R3 4.2X28 (Screw) ×4 IMPLANT
SCREW LOCK PLT 28X4.2XNS GRLL (Screw) IMPLANT
SCREW NLOCK PLATE SB 4.5X30 (Screw) ×1 IMPLANT
SCREW NONLOCK 4.5X28 (Screw) ×1 IMPLANT
SPONGE LAP 18X18 RF (DISPOSABLE) ×2 IMPLANT
SUCTION FRAZIER HANDLE 10FR (MISCELLANEOUS) ×2
SUCTION TUBE FRAZIER 10FR DISP (MISCELLANEOUS) ×1 IMPLANT
SUT ETHILON 2 0 PSLX (SUTURE) ×6 IMPLANT
TOWEL GREEN STERILE (TOWEL DISPOSABLE) ×2 IMPLANT
TOWEL GREEN STERILE FF (TOWEL DISPOSABLE) ×2 IMPLANT
TUBE CONNECTING 12X1/4 (SUCTIONS) ×2 IMPLANT
WATER STERILE IRR 1000ML POUR (IV SOLUTION) ×2 IMPLANT

## 2020-06-24 NOTE — Anesthesia Procedure Notes (Signed)
Anesthesia Regional Block: Adductor canal block   Pre-Anesthetic Checklist: ,, timeout performed, Correct Patient, Correct Site, Correct Laterality, Correct Procedure, Correct Position, site marked, Risks and benefits discussed,  Surgical consent,  Pre-op evaluation,  At surgeon's request and post-op pain management  Laterality: Left and Lower  Prep: chloraprep       Needles:  Injection technique: Single-shot  Needle Type: Echogenic Needle     Needle Length: 9cm  Needle Gauge: 21     Additional Needles:   Procedures:,,,, ultrasound used (permanent image in chart),,,,  Narrative:  Start time: 06/24/2020 8:08 AM End time: 06/24/2020 8:14 AM Injection made incrementally with aspirations every 5 mL.  Performed by: Personally  Anesthesiologist: Annye Asa, MD  Additional Notes: Pt identified in Holding room.  Monitors applied. Working IV access confirmed. Sterile prep L thigh.  #21ga ECHOgenic Arrow block needle into adductor canal with US guidance.  20cc 0.75% Ropivacaine injected incrementally after negative test dose.  Patient asymptomatic, VSS, no heme aspirated, tolerated well.  Jenita Seashore, MD

## 2020-06-24 NOTE — Interval H&P Note (Signed)
History and Physical Interval Note:  06/24/2020 6:56 AM  Jean Mcclain  has presented today for surgery, with the diagnosis of Left Ankle Fracture.  The various methods of treatment have been discussed with the patient and family. After consideration of risks, benefits and other options for treatment, the patient has consented to  Procedure(s): LEFT TIBIOCALCANEAL FUSION (Left) as a surgical intervention.  The patient's history has been reviewed, patient examined, no change in status, stable for surgery.  I have reviewed the patient's chart and labs.  Questions were answered to the patient's satisfaction.     [name]

## 2020-06-24 NOTE — Op Note (Signed)
06/24/2020  10:16 AM  PATIENT:  Jean Mcclain    PRE-OPERATIVE DIAGNOSIS:  Left Ankle Fracture  POST-OPERATIVE DIAGNOSIS:  Same  PROCEDURE:  LEFT TIBIOCALCANEAL FUSION  SURGEON:  [name], MD  PHYSICIAN ASSISTANT:None ANESTHESIA:   General  PREOPERATIVE INDICATIONS:  Jean Mcclain is a  74 y.o. female with a diagnosis of Left Ankle Fracture who failed conservative measures and elected for surgical management.    The risks benefits and alternatives were discussed with the patient preoperatively including but not limited to the risks of infection, bleeding, nerve injury, cardiopulmonary complications, the need for revision surgery, among others, and the patient was willing to proceed.  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  C arm fluoroscopy verifies tibiotalar calcaneal alignment.  OPERATIVE PROCEDURE: Patient was brought the operating room after undergoing a regional block she then underwent a general anesthetic.  After adequate levels anesthesia were obtained patient's left lower extremity was prepped using DuraPrep draped into a sterile field a timeout was called.  Anterior incision was made through the interval of the EHL and anterior tibial tendon this was carried sharply down to bone subperiosteal dissection was used and retractors were placed to protect the neurovascular bundle.  A oscillating saw was used to make cuts perpendicular to the long axis of the tibia for the talar dome and tibial plafond.  The ankle was reduced at 90 degrees rotation with the second toe in line with the midline of the patella and slight valgus.  The plate was then locked to the talus with 2 distal screws a compression screw was then placed in the slot and initial compression was performed an oblique screw was then placed from the talus to the calcaneus and this was compressed  further with a talar calcaneal screw proximal compression screws were placed and then an oblique screw was placed from the tibia to the calcaneus.  C-arm fluoroscopy verified alignment in both AP lateral planes.  The wound was irrigated with normal saline incision was closed using 2-0 nylon this was covered with an axial form dressing and reinforced with a derma tack dressing this was covered and Covan patient was extubated taken to the PACU in stable condition.   DISCHARGE PLANNING:  Antibiotic duration: Continue antibiotics for 24 hours.  Weightbearing: Touchdown weightbearing on the left with the fracture boot  Pain medication: Opioid pathway  Dressing care/ Wound VAC: Continue wound VAC for 1 week  Ambulatory devices: Walker  Discharge to: Anticipate discharge to skilled nursing  Follow-up: In the office 1 week post operative.

## 2020-06-24 NOTE — Anesthesia Postprocedure Evaluation (Signed)
Anesthesia Post Note  Patient: Jean Mcclain  Procedure(s) Performed: LEFT TIBIOCALCANEAL FUSION (Left Ankle)     Patient location during evaluation: PACU Anesthesia Type: General and Regional Level of consciousness: awake and alert, patient cooperative and oriented Pain management: pain level controlled Vital Signs Assessment: post-procedure vital signs reviewed and stable Respiratory status: spontaneous breathing, nonlabored ventilation, respiratory function stable and patient connected to nasal cannula oxygen Cardiovascular status: blood pressure returned to baseline and stable Postop Assessment: no apparent nausea or vomiting Anesthetic complications: no   No complications documented.  Last Vitals:  Vitals:   06/24/20 1147 06/24/20 1200  BP:  136/82  Pulse:  76  Resp:  18  Temp: 36.4 C   SpO2:  100%                  Tanina Barb,E. Daianna Vasques

## 2020-06-24 NOTE — Progress Notes (Signed)
PROGRESS NOTE  Jean Mcclain KGU:[id number] DOB: 18-Jan-1946 DOA: 06/14/2020 PCP: Leanna Battles, MD  Brief History   74 year old woman complex past medical history presented with left ankle and foot pain, found to have fracture requiring surgery.  Surgery was delayed secondary to acute on chronic hypoxic and hypercapnic respiratory failure   A & P  Left ankle fracture --Status post surgery 9/15 per orthopedics. --Management per orthopedics.  Acute on chronic hypoxic and hypercapnic respiratory failure --Appears stable.  Continue BiPAP at night.  Diabetes mellitus type 2 --Stable.  Continue sliding scale insulin and NPH  ESRD --management per nephrology  Disposition Plan:  Discussion: Continue management per orthopedics.  Anticipate transfer to SNF when cleared by orthopedics.  Status is: Inpatient  Remains inpatient appropriate because:Inpatient level of care appropriate due to severity of illness   Dispo: The patient is from: SNF              Anticipated d/c is to: SNF              Anticipated d/c date is: 2 days              Patient currently is not medically stable to d/c.  DVT prophylaxis: per orthopedics SCDs Start: 06/24/20 1227 Code Status: DNR Family Communication:   Murray Hodgkins, MD  [facility] Direct contact: see [email or web address] (further directions at bottom of note if needed) 7PM-7AM contact night coverage as at bottom of note 06/24/2020, 8:15 PM  LOS: 10 days   Significant Hospital Events   .    Consults:  .    Procedures:  .   Significant Diagnostic Tests:  Marland Kitchen    Micro Data:  .    Antimicrobials:  .   Interval History/Subjective  Complains of left leg pain s/p surgery  Objective   Vitals:  Vitals:   06/24/20 1956 06/24/20 2000  BP:  (!) 140/53  Pulse: 77 74  Resp: 16 16  Temp:    SpO2: 98% 100%    Exam:  Constitutional:   . Appears calm, uncomfortable ENMT:  . grossly normal hearing  Respiratory:  . CTA  bilaterally, no w/r/r.  . Respiratory effort normal.  Cardiovascular:  . RRR, no m/r/g . No RLE extremity edema   Psychiatric:  . Mental status o Mood, affect appropriate  I have personally reviewed the following:   Today's Data  . CBG stable  Scheduled Meds: . calcium acetate  667 mg Oral TID with meals  . carbidopa-levodopa  1 tablet Oral BID  . Chlorhexidine Gluconate Cloth  6 each Topical Q0600  . colchicine  0.3 mg Oral Daily  . darbepoetin (ARANESP) injection - DIALYSIS  40 mcg Intravenous Q Wed-HD  . diclofenac Sodium  4 g Topical QID  . docusate sodium  100 mg Oral BID  . febuxostat  40 mg Oral Daily  . gabapentin  100 mg Oral QHS  . insulin aspart  0-9 Units Subcutaneous TID WC  . insulin NPH Human  10 Units Subcutaneous BID AC & HS  . multivitamin  1 tablet Oral Daily  . pantoprazole  40 mg Oral Daily  . rOPINIRole  4 mg Oral BID  . Zinc Oxide   Topical QID   Continuous Infusions: . sodium chloride    .  ceFAZolin (ANCEF) IV      Principal Problem:   Ankle fracture Active Problems:   Insulin-requiring or dependent type II diabetes mellitus (HCC)   Essential hypertension   Pressure injury  of skin   End stage renal disease (HCC)   HLD (hyperlipidemia)   Primary osteoarthritis, left ankle and foot   Advanced care planning/counseling discussion   Goals of care, counseling/discussion   Obstructive sleep apnea   LOS: 10 days   How to contact the [facility] Attending or Consulting provider [place] or covering provider during after hours [place], for this patient?  1. Check the care team in [facility] and look for a) attending/consulting [facility] provider listed and b) the [facility] team listed 2. Log into [email or web address] and use [place]'s universal password to access. If you do not have the password, please contact the hospital operator. 3. Locate the [facility] provider you are looking for under [facility] and page to a number that you can be directly reached. 4. If you still have  difficulty reaching the provider, please page the [facility] (Director on Call) for the Hospitalists listed on amion for assistance.

## 2020-06-24 NOTE — Anesthesia Procedure Notes (Signed)
Procedure Name: Intubation Date/Time: 06/24/2020 8:53 AM Performed by: Michele Rockers, CRNA Pre-anesthesia Checklist: Patient identified, Emergency Drugs available, Suction available and Patient being monitored Patient Re-evaluated:Patient Re-evaluated prior to induction Oxygen Delivery Method: Circle system utilized Preoxygenation: Pre-oxygenation with 100% oxygen Induction Type: IV induction Ventilation: Mask ventilation without difficulty Laryngoscope Size: Miller and 2 Grade View: Grade I Tube type: Oral Tube size: 7.5 mm Number of attempts: 1 Airway Equipment and Method: Stylet and Oral airway Placement Confirmation: ETT inserted through vocal cords under direct vision,  positive ETCO2 and breath sounds checked- equal and bilateral Secured at: 21 cm Tube secured with: Tape Dental Injury: Teeth and Oropharynx as per pre-operative assessment

## 2020-06-24 NOTE — Transfer of Care (Signed)
2Immediate Anesthesia Transfer of Care Note  Patient: Jean Mcclain  Procedure(s) Performed: LEFT TIBIOCALCANEAL FUSION (Left Ankle)  Patient Location: PACU  Anesthesia Type:General  Level of Consciousness: drowsy, patient cooperative and responds to stimulation  Airway & Oxygen Therapy: Patient Spontanous Breathing and Patient connected to face mask oxygen  Post-op Assessment: Report given to RN and Post -op Vital signs reviewed and stable  Post vital signs: Reviewed and stable  Last Vitals:  Vitals Value Taken Time  BP 105/36 06/24/20 1020  Temp    Pulse 83 06/24/20 1021  Resp 14 06/24/20 1021  SpO2 99 % 06/24/20 1021  Vitals shown include unvalidated device data.  Last Pain:  Vitals:   06/24/20 0400  TempSrc: Oral  PainSc:       Patients Stated Pain Goal: 0 (70/17/79 3903)  Complications: No complications documented.

## 2020-06-24 NOTE — Anesthesia Procedure Notes (Signed)
Anesthesia Regional Block: Popliteal block   Pre-Anesthetic Checklist: ,, timeout performed, Correct Patient, Correct Site, Correct Laterality, Correct Procedure, Correct Position, site marked, Risks and benefits discussed,  Surgical consent,  Pre-op evaluation,  At surgeon's request and post-op pain management  Laterality: Lower  Prep: chloraprep       Needles:  Injection technique: Single-shot  Needle Type: Echogenic Needle     Needle Length: 9cm  Needle Gauge: 21     Additional Needles:   Procedures:,,,, ultrasound used (permanent image in chart),,,,  Narrative:  Start time: 06/24/2020 8:15 AM End time: 06/24/2020 8:18 AM Injection made incrementally with aspirations every 5 mL.  Performed by: Personally  Anesthesiologist: Annye Asa, MD  Additional Notes: Pt identified in Holding room.  Monitors applied. Working IV access confirmed. Sterile prep L lateral knee.  #21ga ECHOgenic Arrow block needle to common peroneal nerve with US guidance.  30cc 0.5% Bupivacaine with 1:200k epi injected incrementally after negative test dose.  Patient asymptomatic, VSS, no heme aspirated, tolerated well.  Jenita Seashore, MD

## 2020-06-24 NOTE — Progress Notes (Signed)
Patient has returned from PACU in NAD, VS stable and MEWS has returned to green. Patient free from pain.

## 2020-06-24 NOTE — Hospital Course (Addendum)
74 year old woman complex past medical history presented with left ankle and foot pain, found to have fracture requiring surgery.  Surgery was delayed secondary to acute on chronic hypoxic and hypercapnic respiratory failure requiring BiPAP and pulmonology consultation. Condition improved w/ BiPAP and pt underwent surgery 9/96 w/o complication. Management has been per orthopedics, now ready for SNF. Will need BiPAP nightly.

## 2020-06-24 NOTE — Progress Notes (Signed)
Nephrology Follow-Up Consult note   Assessment/Recommendations: Jean Mcclain is a/an 74 y.o. female with a past medical history significant for DVT on eliquis, ESRD, DM, obesity, OSA< HTN, HLD, admitted for ankle pain.     Dialysis: [facility] MWF  4h 66min  90.5kg  2/2 bath  AVF  15g  Hep 3000 Hectorol 2 mcg IV q HD  1. Chronic ankle and subtalar arthritis with acute medial malleolar fracture: s/p  left tibiocalcaneal fusion w/ Dr. Sharol Given on 9/15. Likely require PT 2. A/C resp failure w/ hypercapnia d/t OSA/ OHS: Bipap nightly. Continue to challenge EDW. 3. ESRD:usual HD is MWF.  HD per schedule, labs today 4. BP/ volume:  BP acceptable. Challenging EDW 5. Anemia:Hgb 10.1, we started esa here 40 ug on 9/09, cbc today 6. Hx of DVT: perioperative anticoagulation per priamry, holding heparin w/ HD today 7. Metabolic bone disease:Continue hecterol and phos binder with meals 8. Nutrition:Renal/carb modified diet.  9. T2DM: On insulin, per primary team   Recommendations conveyed to primary service.    [facility] 06/24/2020 12:24 PM  ___________________________________________________________  CC: ESRD  Interval History/Subjective: Patient slightly confused but feeling okay post surgery. Had left tibiocalcaneal fusion w/ Dr. Sharol Given today. Attempted regional block ultimately required systemic sedation. Patient with minimal pain currently.  Medications:  Current Facility-Administered Medications  Medication Dose Route Frequency Provider Last Rate Last Admin  . acetaminophen (TYLENOL) tablet 650 mg  650 mg Oral Q6H PRN Shela Leff, MD   650 mg at 06/21/20 2218   Or  . acetaminophen (TYLENOL) suppository 650 mg  650 mg Rectal Q6H PRN Shela Leff, MD      . calcium acetate (PHOSLO) capsule 667 mg  667 mg Oral TID with meals Caren Griffins, MD   667 mg at 06/23/20 1804  . calcium acetate (PHOSLO) capsule 667 mg  667 mg Oral Daily PRN  Blenda Nicely, RPH      . carbidopa-levodopa (SINEMET IR) 10-100 MG per tablet immediate release 1 tablet  1 tablet Oral BID Caren Griffins, MD   1 tablet at 06/23/20 1804  . ceFAZolin (ANCEF) IVPB 1 g/50 mL premix  1 g Intravenous Q12H Persons, Bevely Palmer, [place]      . Chlorhexidine Gluconate Cloth 2 % PADS 6 each  6 each Topical Q0600 Janalee Dane, PA-C   6 each at 06/24/20 [phone]  . colchicine tablet 0.3 mg  0.3 mg Oral Daily Eulogio Bear U, DO   0.3 mg at 06/23/20 0831  . Darbepoetin Alfa (ARANESP) injection 40 mcg  40 mcg Intravenous Q Wed-HD Collins, Samantha G, PA-C      . diclofenac Sodium (VOLTAREN) 1 % topical gel 4 g  4 g Topical QID Vann, Jessica U, DO   4 g at 06/24/20 1204  . docusate sodium (COLACE) capsule 100 mg  100 mg Oral BID PRN Eulogio Bear U, DO   100 mg at 06/23/20 2248  . febuxostat (ULORIC) tablet 40 mg  40 mg Oral Daily Caren Griffins, MD   40 mg at 06/23/20 0834  . gabapentin (NEURONTIN) capsule 100 mg  100 mg Oral QHS Vann, Jessica U, DO   100 mg at 06/23/20 2203  . insulin aspart (novoLOG) injection 0-9 Units  0-9 Units Subcutaneous TID WC Vann, Jessica U, DO   5 Units at 06/24/20 0641  . insulin NPH Human (NOVOLIN N) injection 10 Units  10 Units Subcutaneous BID AC & HS Eulogio Bear  U, DO   10 Units at 06/23/20 2204  . levalbuterol (XOPENEX) nebulizer solution 0.63 mg  0.63 mg Nebulization Q8H PRN Lovey Newcomer T, NP   0.63 mg at 06/15/20 0324  . multivitamin (RENA-VIT) tablet 1 tablet  1 tablet Oral Daily Caren Griffins, MD   1 tablet at 06/23/20 0830  . Muscle Rub CREA   Topical PRN Eulogio Bear U, DO      . ondansetron [facility]) injection 4 mg  4 mg Intravenous Q6H PRN Caren Griffins, MD   4 mg at 06/24/20 0902  . pantoprazole (PROTONIX) EC tablet 40 mg  40 mg Oral Daily Caren Griffins, MD   40 mg at 06/23/20 0830  . polyvinyl alcohol (LIQUIFILM TEARS) 1.4 % ophthalmic solution 1 drop  1 drop Both Eyes PRN Eliseo Squires, Jessica U, DO      . rOPINIRole  (REQUIP) tablet 4 mg  4 mg Oral BID Caren Griffins, MD   4 mg at 06/23/20 2204  . sodium chloride (OCEAN) 0.65 % nasal spray 1 spray  1 spray Each Nare PRN Eulogio Bear U, DO   1 spray at 06/23/20 2218  . Zinc Oxide (TRIPLE PASTE) 12.8 % ointment   Topical QID Caren Griffins, MD   Given at 06/24/20 1203   Facility-Administered Medications Ordered in Other Encounters  Medication Dose Route Frequency Provider Last Rate Last Admin  . bupivacaine (PF) (MARCAINE) 0.25 % injection   Other Anesthesia Intra-op Duane Boston, MD   15 mL at 06/17/20 1107  . bupivacaine liposome (EXPAREL) 1.3 % injection   Peri-NEURAL Anesthesia Intra-op Duane Boston, MD   10 mL at 06/17/20 1107      Review of Systems: 10 systems reviewed and negative except per interval history/subjective  Physical Exam: Vitals:   06/24/20 1147 06/24/20 1200  BP:  136/82  Pulse:  76  Resp:  18  Temp: 97.6 F (36.4 C)   SpO2:  100%   Total I/O In: 750 [I.V.:650; IV Piggyback:100] Out: 40 [Blood:40]  Intake/Output Summary (Last 24 hours) at 06/24/2020 1224 Last data filed at 06/24/2020 1006 Gross per 24 hour  Intake 1286 ml  Output 840 ml  Net 446 ml   Constitutional: chronically ill appearing no distress ENMT: ears and nose without scars or lesions, MMM CV: normal rate, no edema Respiratory: Bilateral chest rise, mild iwob Gastrointestinal: soft, non-tender, no palpable masses or hernias Skin: seborrhea on nose and cheeks, otherwise no visible lesions or rashes Psych: alert, judgement/insight appropriate, appropriate mood and affect   Test Results I personally reviewed new and old clinical labs and radiology tests Lab Results  Component Value Date   NA 137 06/22/2020   K 4.1 06/22/2020   CL 97 (L) 06/22/2020   CO2 30 06/22/2020   BUN 38 (H) 06/22/2020   CREATININE 6.08 (H) 06/22/2020   CALCIUM 9.2 06/22/2020   ALBUMIN 2.6 (L) 06/22/2020   PHOS 5.3 (H) 06/22/2020

## 2020-06-25 ENCOUNTER — Encounter (HOSPITAL_COMMUNITY): Payer: Self-pay | Admitting: Orthopedic Surgery

## 2020-06-25 DIAGNOSIS — J9622 Acute and chronic respiratory failure with hypercapnia: Secondary | ICD-10-CM

## 2020-06-25 DIAGNOSIS — J9621 Acute and chronic respiratory failure with hypoxia: Secondary | ICD-10-CM

## 2020-06-25 DIAGNOSIS — D62 Acute posthemorrhagic anemia: Secondary | ICD-10-CM

## 2020-06-25 LAB — HEPATITIS B SURFACE ANTIGEN: Hepatitis B Surface Ag: NONREACTIVE

## 2020-06-25 LAB — RENAL FUNCTION PANEL
Albumin: 2.5 g/dL — ABNORMAL LOW (ref 3.5–5.0)
Anion gap: 8 (ref 5–15)
BUN: 41 mg/dL — ABNORMAL HIGH (ref 8–23)
CO2: 27 mmol/L (ref 22–32)
Calcium: 8.7 mg/dL — ABNORMAL LOW (ref 8.9–10.3)
Chloride: 99 mmol/L (ref 98–111)
Creatinine, Ser: 6.76 mg/dL — ABNORMAL HIGH (ref 0.44–1.00)
GFR calc Af Amer: 6 mL/min — ABNORMAL LOW (ref >60)
GFR calc non Af Amer: 6 mL/min — ABNORMAL LOW (ref >60)
Glucose, Bld: 125 mg/dL — ABNORMAL HIGH (ref 70–99)
Phosphorus: 5 mg/dL — ABNORMAL HIGH (ref 2.5–4.6)
Potassium: 5.3 mmol/L — ABNORMAL HIGH (ref 3.5–5.1)
Sodium: 134 mmol/L — ABNORMAL LOW (ref 135–145)

## 2020-06-25 LAB — GLUCOSE, CAPILLARY
Glucose-Capillary: 129 mg/dL — ABNORMAL HIGH (ref 70–99)
Glucose-Capillary: 119 mg/dL — ABNORMAL HIGH (ref 70–99)
Glucose-Capillary: 167 mg/dL — ABNORMAL HIGH (ref 70–99)
Glucose-Capillary: 133 mg/dL — ABNORMAL HIGH (ref 70–99)

## 2020-06-25 LAB — CBC
HCT: 28.5 % — ABNORMAL LOW (ref 36.0–46.0)
Hemoglobin: 8.6 g/dL — ABNORMAL LOW (ref 12.0–15.0)
MCH: 30.3 pg (ref 26.0–34.0)
MCHC: 30.2 g/dL (ref 30.0–36.0)
MCV: 100.4 fL — ABNORMAL HIGH (ref 80.0–100.0)
Platelets: 227 10*3/uL (ref 150–400)
RBC: 2.84 MIL/uL — ABNORMAL LOW (ref 3.87–5.11)
RDW: 14.7 % (ref 11.5–15.5)
WBC: 10.6 10*3/uL — ABNORMAL HIGH (ref 4.0–10.5)
nRBC: 0 % (ref 0.0–0.2)

## 2020-06-25 LAB — HEPATITIS B CORE ANTIBODY, TOTAL: Hep B Core Total Ab: NONREACTIVE

## 2020-06-25 MED ORDER — DARBEPOETIN ALFA 40 MCG/0.4ML IJ SOSY
PREFILLED_SYRINGE | INTRAMUSCULAR | Status: AC
  Administered 2020-06-25: 40 ug via INTRAVENOUS
  Filled 2020-06-25: qty 0.4

## 2020-06-25 MED ORDER — HYDROMORPHONE HCL 1 MG/ML IJ SOLN
INTRAMUSCULAR | Status: AC
  Administered 2020-06-25: 0.5 mg via INTRAVENOUS
  Filled 2020-06-25: qty 0.5

## 2020-06-25 MED ORDER — OXYCODONE HCL 5 MG PO TABS
ORAL_TABLET | ORAL | Status: AC
Start: 2020-06-25 — End: 2020-06-25
  Filled 2020-06-25: qty 1

## 2020-06-25 NOTE — Progress Notes (Signed)
PT Cancellation Note  Patient Details Name: Jean Mcclain MRN: [id number] DOB: 1946/07/25   Cancelled Treatment:    Reason Eval/Treat Not Completed: (P) Patient at procedure or test/unavailable Pt is off the floor for HD. PT will follow back for Re-Evaluation this afternoon as able.   Keelan Pomerleau B. Migdalia Dk PT, DPT Acute Rehabilitation Services Pager [phone] Office [phone]    [place] 06/25/2020, 8:35 AM

## 2020-06-25 NOTE — Plan of Care (Signed)
  Problem: Education: Goal: Knowledge of General Education information will improve Description Including pain rating scale, medication(s)/side effects and non-pharmacologic comfort measures Outcome: Progressing   

## 2020-06-25 NOTE — Progress Notes (Signed)
Pt back from HD, MEWS was yellow, when coming to the floor MEWS is green.

## 2020-06-25 NOTE — Progress Notes (Signed)
Patient ID: Jean Mcclain, female   DOB: 04/26/1946, 74 y.o.   MRN: [id number] Patient is postoperative day 1 tibial calcaneal fusion on the left.  The dressing is clean and dry the wound VAC is functioning well.  Patient may be weightbearing for transfers, no gait training.  Weightbearing with the Cam boot walker on, she does not need the boot while in bed.

## 2020-06-25 NOTE — Progress Notes (Signed)
Physical Therapy Treatment Patient Details Name: Jean Mcclain MRN: [id number] DOB: 01-02-1946 Today's Date: 06/25/2020    History of Present Illness 74 year old female with history of prior DVT on Eliquis, ESRD on HD MWF, IDDM, morbid obesity with a BMI of 40, OSA not on CPAP, hypertension, hyperlipidemia, GERD, nightly oxygen (2L) came into the hospital with worsening left ankle and foot pain with decreased mobility. This has been going on for a while, does not really recall an injury to the foot. Imaging of the left foot in the ED showed transverse mildly displaced fracture to the medial malleolus, subtalar dislocation with lateral angulation of the majority of the foot, lateral subluxation of the navicular at the talonavicular joint. Orthopedic surgery was consulted and she was admitted to the hospital.  Plan for surgery on 9/8 but patient was confused and lethargic at times; s/p 9/15 tibial calcaneal fusion on the left    PT Comments    Pt recently returned from HD in 10/10 pain in L foot, received pain medication and very lethargic at beginning of therapy session. Pt continues to be limited by L foot pain with ROM, decreased bilateral shoulder movement, increased O2 demand and deconditioning. Provided supine AAROM prior to total Ax2 to come to EoB with total A for balance, with 15 minutes of sitting pt able to progress from total A for balance to min guard with therapeutic exercise at EoB. Pt requires maxAx2 for return to bed. Therapy will work with pt on transfers if pt more alert and participatory in next session.  D/c plans remain appropriate. PT will continue to follow acutely.    Follow Up Recommendations  SNF;Supervision/Assistance - 24 hour     Equipment Recommendations  Other (comment) (defer to next venue)       Precautions / Restrictions Precautions Precautions: Fall Required Braces or Orthoses: Other Brace Other Brace: CAM boot LLE Restrictions Weight Bearing  Restrictions: Yes LLE Weight Bearing: Weight bearing as tolerated Other Position/Activity Restrictions: WBAT with CAM boot and only for transfers     Mobility  Bed Mobility Overal bed mobility: Needs Assistance Bed Mobility: Supine to Sit;Sit to Supine     Supine to sit: Total assist;+2 for physical assistance Sit to supine: Max assist;+2 for physical assistance   General bed mobility comments: total A and helicopter transfer to come to EoB due to increased lethargy, max Ax2 for return to bed as pt more participatory         Balance Overall balance assessment: Needs assistance Sitting-balance support: Bilateral upper extremity supported;Feet unsupported Sitting balance-Leahy Scale: Poor Sitting balance - Comments: sitting EoB for 15 minutes with max A progressing to min guard                                     Cognition Arousal/Alertness: Lethargic Behavior During Therapy: Flat affect;Restless Overall Cognitive Status: Difficult to assess                         Following Commands: Follows multi-step commands inconsistently;Follows one step commands with increased time     Problem Solving: Slow processing;Decreased initiation;Difficulty sequencing;Requires verbal cues;Requires tactile cues General Comments: pt lethargic for most of session, with 15 min of sitting improved command follow and able to feed herself some fruit      Exercises General Exercises - Lower Extremity Ankle Circles/Pumps: AROM;Right;10 reps;Supine Long Arc Quad: AROM;Both;10  reps;Seated Heel Slides: AROM;Right;20 reps;AAROM;Left Hip ABduction/ADduction: AROM;Right;20 reps;AAROM;Left Straight Leg Raises: AROM;Right;10 reps;Supine    General Comments General comments (skin integrity, edema, etc.): VSS on 4L O2 via [place]       Pertinent Vitals/Pain Pain Assessment: Faces Faces Pain Scale: Hurts little more Pain Location: left LE; only when moving it Pain Descriptors /  Indicators: Aching;Grimacing;Guarding Pain Intervention(s): Limited activity within patient's tolerance;Monitored during session;Repositioned    Home Living Family/patient expects to be discharged to:: Skilled nursing facility               Additional Comments: pt states she has been sleeping in a chair and has someone assist her to standing to shuffle to motorized WC/scooter; does not walk    Prior Function Level of Independence: Needs assistance  Gait / Transfers Assistance Needed: pt states she has people help her from the chair<>WC       PT Goals (current goals can now be found in the care plan section) Acute Rehab PT Goals Patient Stated Goal: "to get rid of the sores on my bottom PT Goal Formulation: With patient Time For Goal Achievement: 07/02/20 Potential to Achieve Goals: Fair Progress towards PT goals: Not progressing toward goals - comment    Frequency    Min 2X/week      PT Plan Current plan remains appropriate       AM-PAC PT "6 Clicks" Mobility   Outcome Measure  Help needed turning from your back to your side while in a flat bed without using bedrails?: A Lot Help needed moving from lying on your back to sitting on the side of a flat bed without using bedrails?: A Lot Help needed moving to and from a bed to a chair (including a wheelchair)?: A Lot Help needed standing up from a chair using your arms (e.g., wheelchair or bedside chair)?: A Lot Help needed to walk in hospital room?: Total Help needed climbing 3-5 steps with a railing? : Total 6 Click Score: 10    End of Session   Activity Tolerance: Patient limited by fatigue;Patient limited by lethargy Patient left: in bed;with call bell/phone within reach Nurse Communication: Mobility status PT Visit Diagnosis: Muscle weakness (generalized) (M62.81);History of falling (Z91.81);Other abnormalities of gait and mobility (R26.89) Pain - Right/Left: Left Pain - part of body: Leg     Time:  1340-1404 PT Time Calculation (min) (ACUTE ONLY): 24 min  Charges:  $Therapeutic Exercise: 8-22 mins $Therapeutic Activity: 8-22 mins                     [place]eno Clasby B. Migdalia Dk PT, DPT Acute Rehabilitation Services Pager [phone] Office [phone]    [place] 06/25/2020, 5:04 PM

## 2020-06-25 NOTE — Progress Notes (Signed)
Nephrology Follow-Up Consult note   Assessment/Recommendations: Jean Mcclain is a/an 74 y.o. female with a past medical history significant for DVT on eliquis, ESRD, DM, obesity, OSA< HTN, HLD, admitted for ankle pain.     Dialysis: [facility] MWF  4h 33min  90.5kg  2/2 bath  AVF  15g  Hep 3000 Hectorol 2 mcg IV q HD  1. Chronic ankle and subtalar arthritis with acute medial malleolar fracture: s/p  left tibiocalcaneal fusion w/ Dr. Sharol Given on 9/15. Likely require PT. Pain control per primary team 2. A/C resp failure w/ hypercapnia d/t OSA/ OHS: Bipap nightly. Continue to challenge EDW. 3. ESRD:usual HD is MWF.  HD per schedule 4. BP/ volume:  BP acceptable. Challenging EDW 5. Anemia:Hgb 10.1, we started esa here 40 ug on 9/09, cbc today 6. Hx of DVT: perioperative anticoagulation per priamry, holding heparin w/ HD today 7. Metabolic bone disease:Continue hecterol and phos binder with meals 8. Nutrition:Renal/carb modified diet.  9. T2DM: On insulin, per primary team   Recommendations conveyed to primary service.    [facility] 06/25/2020 11:42 AM  ___________________________________________________________  CC: ESRD  Interval History/Subjective: Patient seen on dialysis tolerating well. However, having a lot of pain in her ankle where she had surgery. States the pain is not well controlled.  Medications:  Current Facility-Administered Medications  Medication Dose Route Frequency Provider Last Rate Last Admin  . 0.9 %  sodium chloride infusion   Intravenous Continuous Persons, Bevely Palmer, [place] 50 mL/hr at 06/24/20 2106 New Bag at 06/24/20 2106  . acetaminophen (TYLENOL) tablet 650 mg  650 mg Oral Q6H PRN Persons, Bevely Palmer, [place]   650 mg at 06/21/20 2218   Or  . acetaminophen (TYLENOL) suppository 650 mg  650 mg Rectal Q6H PRN Persons, Bevely Palmer, PA      . calcium acetate (PHOSLO) capsule 667 mg  667 mg Oral TID with meals Persons, Bevely Palmer, [place]    667 mg at 06/23/20 1804  . calcium acetate (PHOSLO) capsule 667 mg  667 mg Oral Daily PRN Persons, Bevely Palmer, PA      . carbidopa-levodopa (SINEMET IR) 10-100 MG per tablet immediate release 1 tablet  1 tablet Oral BID Persons, Bevely Palmer, [place]   1 tablet at 06/24/20 1632  . Chlorhexidine Gluconate Cloth 2 % PADS 6 each  6 each Topical Q0600 Persons, Bevely Palmer, [place]   6 each at 06/25/20 [phone]  . colchicine tablet 0.3 mg  0.3 mg Oral Daily Persons, Bevely Palmer, PA   0.3 mg at 06/23/20 0831  . Darbepoetin Alfa (ARANESP) injection 40 mcg  40 mcg Intravenous Q Wed-HD Persons, Bevely Palmer, PA   40 mcg at 06/25/20 1055  . diclofenac Sodium (VOLTAREN) 1 % topical gel 4 g  4 g Topical QID Persons, Bevely Palmer, [place]   4 g at 06/24/20 2112  . docusate sodium (COLACE) capsule 100 mg  100 mg Oral BID PRN Persons, Bevely Palmer, PA   100 mg at 06/23/20 2248  . docusate sodium (COLACE) capsule 100 mg  100 mg Oral BID Persons, Bevely Palmer, PA   100 mg at 06/24/20 2113  . febuxostat (ULORIC) tablet 40 mg  40 mg Oral Daily Persons, Bevely Palmer, [place]   40 mg at 06/23/20 0834  . gabapentin (NEURONTIN) capsule 100 mg  100 mg Oral QHS Persons, Bevely Palmer, PA   100 mg at 06/24/20 2113  . HYDROmorphone (DILAUDID) injection 0.5 mg  0.5 mg Intravenous  Q4H PRN Persons, Bevely Palmer, PA   0.5 mg at 06/25/20 1055  . insulin aspart (novoLOG) injection 0-9 Units  0-9 Units Subcutaneous TID WC Persons, Bevely Palmer, [place]   1 Units at 06/25/20 [phone]  . insulin NPH Human (NOVOLIN N) injection 10 Units  10 Units Subcutaneous BID AC & HS Persons, Bevely Palmer, [place]   10 Units at 06/23/20 2204  . levalbuterol (XOPENEX) nebulizer solution 0.63 mg  0.63 mg Nebulization Q8H PRN Persons, Bevely Palmer, PA   0.63 mg at 06/15/20 0324  . multivitamin (RENA-VIT) tablet 1 tablet  1 tablet Oral Daily Persons, Bevely Palmer, [place]   1 tablet at 06/23/20 0830  . Muscle Rub CREA   Topical PRN Persons, Bevely Palmer, PA      . ondansetron [facility]) injection 4 mg  4 mg Intravenous Q6H PRN Persons, Bevely Palmer, PA   4 mg at 06/24/20 0902  . oxyCODONE (Oxy IR/ROXICODONE) immediate release tablet 5 mg  5 mg Oral Q4H PRN Persons, Bevely Palmer, PA   5 mg at 06/25/20 0745  . pantoprazole (PROTONIX) EC tablet 40 mg  40 mg Oral Daily Persons, Bevely Palmer, PA   40 mg at 06/23/20 0830  . polyvinyl alcohol (LIQUIFILM TEARS) 1.4 % ophthalmic solution 1 drop  1 drop Both Eyes PRN Persons, Bevely Palmer, PA      . rOPINIRole (REQUIP) tablet 4 mg  4 mg Oral BID Persons, Bevely Palmer, [place]   4 mg at 06/24/20 2113  . sodium chloride (OCEAN) 0.65 % nasal spray 1 spray  1 spray Each Nare PRN Persons, Bevely Palmer, [place]   1 spray at 06/23/20 2218  . Zinc Oxide (TRIPLE PASTE) 12.8 % ointment   Topical QID Persons, Bevely Palmer, [place]   Given at 06/24/20 1646      Review of Systems: 10 systems reviewed and negative except per interval history/subjective  Physical Exam: Vitals:   06/25/20 1100 06/25/20 1118  BP: 107/69 (!) 94/56  Pulse: 95 87  Resp:    Temp:    SpO2:     No intake/output data recorded.  Intake/Output Summary (Last 24 hours) at 06/25/2020 1142 Last data filed at 06/24/2020 1600 Gross per 24 hour  Intake 240 ml  Output --  Net 240 ml   Constitutional: chronically ill appearing no distress ENMT: ears and nose without scars or lesions, MMM CV: normal rate, no edema Respiratory: Bilateral chest rise, mild iwob Gastrointestinal: soft, non-tender, no palpable masses or hernias Skin: seborrhea on nose and cheeks, otherwise no visible lesions or rashes Psych: alert, judgement/insight appropriate, appropriate mood and affect   Test Results I personally reviewed new and old clinical labs and radiology tests Lab Results  Component Value Date   NA 134 (L) 06/25/2020   K 5.3 (H) 06/25/2020   CL 99 06/25/2020   CO2 27 06/25/2020   BUN 41 (H) 06/25/2020   CREATININE 6.76 (H) 06/25/2020   CALCIUM 8.7 (L) 06/25/2020   ALBUMIN 2.5 (L) 06/25/2020   PHOS 5.0 (H) 06/25/2020

## 2020-06-25 NOTE — Plan of Care (Signed)
  Problem: Education: Goal: Knowledge of General Education information will improve Description: Including pain rating scale, medication(s)/side effects and non-pharmacologic comfort measures 06/25/2020 2034 by Nelia Shi, RN Outcome: Progressing 06/25/2020 2034 by Nelia Shi, RN Outcome: Progressing

## 2020-06-25 NOTE — Assessment & Plan Note (Signed)
--  s/p surgery --check CBC in AM

## 2020-06-25 NOTE — Progress Notes (Signed)
PROGRESS NOTE  Jean LAFAVE YQM:[id number] DOB: Apr 06, 1946 DOA: 06/14/2020 PCP: Leanna Battles, MD  Brief History   74 year old woman complex past medical history presented with left ankle and foot pain, found to have fracture requiring surgery.  Surgery was delayed secondary to acute on chronic hypoxic and hypercapnic respiratory failure. Now s/p surgery 5/28 w/o complication. Management per orthopedics, will go to SNF when cleared.   A & P  Left ankle fracture --Status post surgery 9/15 per orthopedics. --continue management per orthopedics.  Acute on chronic hypoxic and hypercapnic respiratory failure --remains stable.  Continue BiPAP at night and supplemental oxygen during day  Diabetes mellitus type 2 --remains stable.  Continue sliding scale insulin and NPH  ESRD --management per nephrology  Disposition Plan:  Discussion: continue management per orthopedics.  Anticipate transfer to SNF when cleared by orthopedics.  Status is: Inpatient  Remains inpatient appropriate because:Inpatient level of care appropriate due to severity of illness   Dispo: The patient is from: SNF              Anticipated d/c is to: SNF              Anticipated d/c date is: 2 days              Patient currently is not medically stable to d/c.  DVT prophylaxis: per orthopedics SCDs Start: 06/24/20 1227 can start pharmacotherapy when ortho ready Code Status: DNR Family Communication:   Murray Hodgkins, MD  [facility] Direct contact: see [email or web address] (further directions at bottom of note if needed) 7PM-7AM contact night coverage as at bottom of note 06/25/2020, 6:53 PM  LOS: 11 days   Significant Hospital Events   .    Consults:  . Orthopedics    Procedures:   LEFT TIBIOCALCANEAL FUSION  Significant Diagnostic Tests:  Marland Kitchen    Micro Data:  .    Antimicrobials:  .   Interval History/Subjective  Only complaint is left leg pain F/u left leg fx Breathing fine  Objective    Vitals:  Vitals:   06/25/20 1611 06/25/20 1700  BP:  (!) 113/40  Pulse:  90  Resp:    Temp: 98.7 F (37.1 C)   SpO2:  96%    Exam:  Constitutional:   . Appears calm and mildly uncomfortable Respiratory:  . CTA bilaterally, no w/r/r.  . Respiratory effort normal. Cardiovascular:  . RRR, no m/r/g . No RLE extremity edema   Psychiatric:  . Mental status o Mood, affect appropriate  I have personally reviewed the following:   Today's Data  . CBG remains stable . K+ 5.3 > deferred to nephrology . Hgb down to 8.6  Scheduled Meds: . calcium acetate  667 mg Oral TID with meals  . carbidopa-levodopa  1 tablet Oral BID  . Chlorhexidine Gluconate Cloth  6 each Topical Q0600  . colchicine  0.3 mg Oral Daily  . darbepoetin (ARANESP) injection - DIALYSIS  40 mcg Intravenous Q Wed-HD  . diclofenac Sodium  4 g Topical QID  . docusate sodium  100 mg Oral BID  . febuxostat  40 mg Oral Daily  . gabapentin  100 mg Oral QHS  . insulin aspart  0-9 Units Subcutaneous TID WC  . insulin NPH Human  10 Units Subcutaneous BID AC & HS  . multivitamin  1 tablet Oral Daily  . pantoprazole  40 mg Oral Daily  . rOPINIRole  4 mg Oral BID  . Zinc Oxide   Topical QID  Continuous Infusions: . sodium chloride Stopped (06/25/20 1757)    Principal Problem:   Ankle fracture Active Problems:   Acute blood loss anemia   Insulin-requiring or dependent type II diabetes mellitus (HCC)   Essential hypertension   Pressure injury of skin   End stage renal disease (HCC)   HLD (hyperlipidemia)   Primary osteoarthritis, left ankle and foot   Advanced care planning/counseling discussion   Goals of care, counseling/discussion   Obstructive sleep apnea   LOS: 11 days   How to contact the [facility] Attending or Consulting provider [place] or covering provider during after hours [place], for this patient?  1. Check the care team in [facility] and look for a) attending/consulting [facility] provider listed and b) the [facility]  team listed 2. Log into [email or web address] and use [place]'s universal password to access. If you do not have the password, please contact the hospital operator. 3. Locate the [facility] provider you are looking for under [facility] and page to a number that you can be directly reached. 4. If you still have difficulty reaching the provider, please page the [facility] (Director on Call) for the Hospitalists listed on amion for assistance.

## 2020-06-26 ENCOUNTER — Ambulatory Visit: Payer: HMO | Admitting: Pulmonary Disease

## 2020-06-26 LAB — CBC
HCT: 29.2 % — ABNORMAL LOW (ref 36.0–46.0)
Hemoglobin: 8.8 g/dL — ABNORMAL LOW (ref 12.0–15.0)
MCH: 30.8 pg (ref 26.0–34.0)
MCHC: 30.1 g/dL (ref 30.0–36.0)
MCV: 102.1 fL — ABNORMAL HIGH (ref 80.0–100.0)
Platelets: 224 10*3/uL (ref 150–400)
RBC: 2.86 MIL/uL — ABNORMAL LOW (ref 3.87–5.11)
RDW: 15.1 % (ref 11.5–15.5)
WBC: 11 10*3/uL — ABNORMAL HIGH (ref 4.0–10.5)
nRBC: 0 % (ref 0.0–0.2)

## 2020-06-26 LAB — GLUCOSE, CAPILLARY
Glucose-Capillary: 102 mg/dL — ABNORMAL HIGH (ref 70–99)
Glucose-Capillary: 111 mg/dL — ABNORMAL HIGH (ref 70–99)
Glucose-Capillary: 106 mg/dL — ABNORMAL HIGH (ref 70–99)
Glucose-Capillary: 101 mg/dL — ABNORMAL HIGH (ref 70–99)
Glucose-Capillary: 101 mg/dL — ABNORMAL HIGH (ref 70–99)

## 2020-06-26 LAB — HEPATITIS B SURFACE ANTIBODY, QUANTITATIVE: Hep B S AB Quant (Post): <3.1 m[IU]/mL — ABNORMAL LOW (ref >9.9)

## 2020-06-26 LAB — SARS CORONAVIRUS 2 BY RT PCR (HOSPITAL ORDER, PERFORMED IN ~~LOC~~ HOSPITAL LAB): SARS Coronavirus 2: NEGATIVE

## 2020-06-26 MED ORDER — INSULIN NPH (HUMAN) (ISOPHANE) 100 UNIT/ML ~~LOC~~ SUSP: 10.0000 [IU] | Freq: Two times a day (BID) | SUBCUTANEOUS | Status: DC

## 2020-06-26 MED ORDER — DARBEPOETIN ALFA 60 MCG/0.3ML IJ SOSY: 60.0000 ug | PREFILLED_SYRINGE | INTRAMUSCULAR | Status: DC

## 2020-06-26 MED ORDER — GABAPENTIN 100 MG PO CAPS: 100.0000 mg | ORAL_CAPSULE | Freq: Every day | ORAL | Status: DC

## 2020-06-26 MED ORDER — OXYCODONE HCL 5 MG PO TABS
5.0000 mg | ORAL_TABLET | Freq: Four times a day (QID) | ORAL | 0 refills | Status: DC | PRN
Start: 2020-06-26 — End: 2022-01-14

## 2020-06-26 MED ORDER — ACETAMINOPHEN 500 MG PO TABS: 500.0000 mg | ORAL_TABLET | Freq: Four times a day (QID) | ORAL | 0 refills | Status: DC | PRN

## 2020-06-26 MED ORDER — ZINC OXIDE 12.8 % EX OINT: TOPICAL_OINTMENT | Freq: Four times a day (QID) | CUTANEOUS | Status: DC

## 2020-06-26 MED ORDER — COLCHICINE 0.6 MG PO TABS: 0.3000 mg | ORAL_TABLET | Freq: Every day | ORAL | Status: DC

## 2020-06-26 MED ORDER — DIPHENHYDRAMINE HCL 25 MG PO CAPS
25.0000 mg | ORAL_CAPSULE | Freq: Four times a day (QID) | ORAL | Status: DC | PRN
  Administered 2020-06-26 (×3): 25 mg via ORAL
  Filled 2020-06-26 (×4): qty 1

## 2020-06-26 NOTE — Progress Notes (Signed)
Rt placed pt on BIPAP V60 for the night. Pt on 16/8 40% BUR 14. Pts respiratory status is stable w/no distress at this time. RT will continue to monitor.

## 2020-06-26 NOTE — Progress Notes (Signed)
Patient is postop day 2 status post ankle fusion.  She is lying in bed with her CPAP appears comfortable Praveena pump is attached working well  Dressing in place.  We will plan for follow-up in our office 1 week after discharge

## 2020-06-26 NOTE — Discharge Summary (Addendum)
Physician Discharge Summary  WALIDA CAJAS NWG:[id number] DOB: 1946/07/14 DOA: 06/14/2020  PCP: Leanna Battles, MD  Admit date: 06/14/2020 Discharge date: 06/27/2020  Recommendations for Outpatient Follow-up:  Chronic ankle and subtalar arthritis with acute medial malleolar fracture. --Status post surgery 9/15 per orthopedics. --continue managementper orthopedics including  --Patient may be weightbearing for transfers, no gait training.  Weightbearing with the Cam boot walker on, she does not need the boot while in bed. --Praveena pump, keep dressing in place, follow-up with Dr. Sharol Given in 1 week. -Incidental finding of chronic bilateral shoulder subluxation -- followup with orthopedics as an outpatient.  Acute on chronic hypoxic and hypercapnic respiratory failure, OHS --remainsstable. Continue BiPAP at night and supplemental oxygen during day --high risk for recurrent episodes of hypercarbic respiratory failure. --Continue BiPAP at nighttime. --Follow-up in sleep clinic as soon as possible upon discharge. Dr. Valeta Harms will help arrange this in office. 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      Contact information for follow-up providers    Persons, Bevely Palmer, [place] In 1 week.   Specialty: Orthopedic Surgery Contact information: [place] [address] [phone]        Garner Nash, DO. Schedule an appointment as soon as possible for a visit in 2 week(s).   Specialty: Pulmonary Disease Why: call office for sleep study appointment and followup chronic respiratory failure Contact information: [address] [place] [address] [phone]            Contact information for after-discharge care    Destination    [facility] .   Service: Skilled Nursing Contact information: [address] [place] [phone]                   Discharge  Diagnoses: Principal diagnosis is #1 Principal Problem:   Ankle fracture Active Problems:   Acute on chronic respiratory failure with hypoxia and hypercapnia (HCC)   Acute blood loss anemia   Insulin-requiring or dependent type II diabetes mellitus (HCC)   Essential hypertension   Pressure injury of skin   End stage renal disease (HCC)   HLD (hyperlipidemia)   Primary osteoarthritis, left ankle and foot   Advanced care planning/counseling discussion   Goals of care, counseling/discussion   Obstructive sleep apnea  Discharge Condition: improved Disposition: short-term rehab  Diet recommendation: heart healthy diabetic diet  Filed Weights   06/25/20 0530 06/25/20 0722 06/27/20 0626  Weight: 91 kg 92 kg 83 kg    History of present illness/Hospital Course:  74 year old woman complex past medical history presented with left ankle and foot pain, found to have fracture requiring surgery.  Surgery was delayed secondary to acute on chronic hypoxic and hypercapnic respiratory failure requiring BiPAP and pulmonology consultation. Condition improved w/ BiPAP and pt underwent surgery 0/34 w/o complication. Management has been per orthopedics, now ready for SNF. Will need BiPAP nightly.  Discussed with Dr. Sharol Given 9/18, cleared for discharge  Chronic ankle and subtalar arthritis with acute medial malleolar fracture. --Status post surgery 9/15 per orthopedics. --continue managementper orthopedics including  --Patient may be weightbearing for transfers, no gait training.  Weightbearing with the Cam boot walker on, she does not need the boot while in bed. --Praveena pump, keep dressing in place, follow-up with Dr. Sharol Given in 1 week.  Acute on chronic hypoxic and hypercapnic respiratory failure, OHS --remainsstable. Continue BiPAP at night and supplemental oxygen during day --high risk for recurrent  episodes of hypercarbic respiratory failure. --Continue BiPAP at nighttime. --Follow-up in sleep  clinic as soon as possible upon discharge. Dr. Valeta Harms will help arrange this in office. 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  Diabetes mellitus type 2 on insulin --remains stable. Continue sliding scale insulin and NPH  ESRD MWF --management per nephrology  PMH DVT, on apixaban  OSA not on CPAP, chronic hypoxic resp failure on home oxygen at night --management as above  Obesity  --f/u as oupt Myoclonic jerks, RLS --followed by neurology, continue Sinemet, Requip  Macrocytic anemia -stable, -f/u as an outpt  DNR per PMT  Pressure ulcer- POA Pressure Injury 06/14/20 Buttocks Right;Posterior Stage 2 - Partial thickness loss of dermis presenting as a shallow open injury with a red, pink wound bed without slough. (Active)  06/14/20 2200  Location: Buttocks  Location Orientation: Right;Posterior  Staging: Stage 2 - Partial thickness loss of dermis presenting as a shallow open injury with a red, pink wound bed without slough.  Wound Description (Comments):   Present on Admission: Yes  -WOC consult:Using no rinse spray, cleanse buttocks and bilateral posterior upper thighs. Apply triple paste.I have also entered an order for mattress with low air loss feature.    Consults:  . Orthopedics    Procedures:   LEFT TIBIOCALCANEAL FUSION   9/18 assessment Subjective.  Feels okay today except has significant left ankle pain. Vitals:   06/27/20 1200 06/27/20 1300  BP: (!) 98/42 (!) 98/35  Pulse: 89 89  Resp: (!) 22 15  Temp:    SpO2: 96% 96%   Constitutional:   . Appears calm and comfortable Respiratory:  . CTA bilaterally, no w/r/r.  . Respiratory effort normal.  Cardiovascular:  . RRR, no m/r/g . No RLE extremity edema   Psychiatric:  . Mental status o Mood, affect appropriate Left leg bandaged  Assessment from 9/17: S: feels ok, having left leg pain O: Vitals:  Vitals:   06/27/20 1200 06/27/20 1300  BP:  (!) 98/42 (!) 98/35  Pulse: 89 89  Resp: (!) 22 15  Temp:    SpO2: 96% 96%    Constitutional:  . Appears calm and comfortable Respiratory:  . CTA bilaterally, no w/r/r.  . Respiratory effort normal.  Cardiovascular:  . RRR, no m/r/g Musculoskeletal:  . LLE bandaged   .  Psychiatric:  . judgement and insight appear normal . Mental status o Mood, affect appropriate  CBG stable Hgb stable 8.8  Discharge Instructions  Discharge Instructions    Ambulatory referral to Pulmonology   Complete by: As directed    Sleep study and chronic resp failure   Reason for referral: Other   Call MD for:  redness, tenderness, or signs of infection (pain, swelling, redness, odor or green/yellow discharge around incision site)   Complete by: As directed    Call MD for:  severe uncontrolled pain   Complete by: As directed    Call MD for:  temperature >100.4   Complete by: As directed    Discharge wound care:   Complete by: As directed    06/24/20 1227   Reinforce dressing  Until discontinued         Negative Pressure Wound Therapy - Incisional  Until discontinued      Question Answer Comment Amount of suction? 125 mm/Hg  Suction Type? Continuous  Remove Dressing? Do not remove dressing      Negative Pressure Wound Therapy - Incisional      Comments:  Show patient how to attach prevena pump   Negative Pressure Wound Therapy - Incisional   Complete by: As directed    Show patient how to attach prevena pump     Allergies as of 06/27/2020   No Known Allergies     Medication List    STOP taking these medications   metoprolol succinate 25 MG 24 hr tablet Commonly known as: TOPROL-XL   NovoLIN R ReliOn 100 units/mL injection Generic drug: insulin regular   pravastatin 40 MG tablet Commonly known as: PRAVACHOL   predniSONE 20 MG tablet Commonly known as: DELTASONE     TAKE these medications   acetaminophen 500 MG tablet Commonly known as: TYLENOL Take 1 tablet (500 mg  total) by mouth every 6 (six) hours as needed for mild pain or headache. What changed:   how much to take  when to take this  reasons to take this   albuterol 108 (90 Base) MCG/ACT inhaler Commonly known as: VENTOLIN HFA Inhale 2 puffs into the lungs every 6 (six) hours as needed for wheezing or shortness of breath.   B-D INS SYR ULTRAFINE 1CC/30G 30G X 1/2" 1 ML Misc Generic drug: Insulin Syringe-Needle U-100 1 each by Other route daily.   calcium acetate 667 MG tablet Commonly known as: PHOSLO Take 667 mg by mouth See admin instructions. Take one tablet (667 mg) by mouth up to three times daily with meals and with a nutritional shake every night   carbidopa-levodopa 10-100 MG tablet Commonly known as: SINEMET IR Take 1 tablet by mouth in the morning and at bedtime.   colchicine 0.6 MG tablet Take 0.5 tablets (0.3 mg total) by mouth daily. What changed: how much to take   doxercalciferol 4 MCG/2ML injection Commonly known as: HECTOROL Inject 1 mL (2 mcg total) into the vein every Monday, Wednesday, and Friday with hemodialysis.   Eliquis 2.5 MG Tabs tablet Generic drug: apixaban Take 2.5 mg by mouth 2 (two) times daily.   febuxostat 40 MG tablet Commonly known as: ULORIC Take 40 mg by mouth daily.   gabapentin 100 MG capsule Commonly known as: NEURONTIN Take 1 capsule (100 mg total) by mouth at bedtime. What changed:   medication strength  how much to take  when to take this   glucose blood test strip 1 each by Other route daily.   insulin NPH Human 100 UNIT/ML injection Commonly known as: NOVOLIN N Inject 0.1 mLs (10 Units total) into the skin 2 (two) times daily at 8 am and 10 pm. What changed: how much to take   multivitamin Tabs tablet Take 1 tablet by mouth daily.   omeprazole 20 MG capsule Commonly known as: PRILOSEC Take 20 mg by mouth daily before breakfast.   OneTouch Delica Plus [id number] Misc 1 each by Other route daily.   OneTouch  Verio Flex System w/Device Kit   oxyCODONE 5 MG immediate release tablet Commonly known as: Oxy IR/ROXICODONE Take 1 tablet (5 mg total) by mouth every 6 (six) hours as needed for moderate pain or severe pain.   OXYGEN Inhale 2 L into the lungs as needed (shortness of breath).   rOPINIRole 4 MG tablet Commonly known as: REQUIP Take 4 mg by mouth 2 (two) times daily.   Zinc Oxide 12.8 % ointment Commonly known as: TRIPLE PASTE Apply topically 4 (four) times daily.            Discharge Care Instructions  (From admission, onward)  Start     Ordered   06/26/20 0000  Discharge wound care:       Comments: 06/24/20 1227   Reinforce dressing  Until discontinued         Negative Pressure Wound Therapy - Incisional  Until discontinued      Question Answer Comment Amount of suction? 125 mm/Hg  Suction Type? Continuous  Remove Dressing? Do not remove dressing      Negative Pressure Wound Therapy - Incisional      Comments: Show patient how to attach prevena pump   06/26/20 1242         No Known Allergies  The results of significant diagnostics from this hospitalization (including imaging, microbiology, ancillary and laboratory) are listed below for reference.    Significant Diagnostic Studies: DG Ankle Complete Left  Result Date: 06/14/2020 CLINICAL DATA:  Left ankle deformity. Patient states does not know what happened has been in this position for 2 weeks. EXAM: LEFT ANKLE COMPLETE - 3+ VIEW COMPARISON:  Ankle radiograph 04/24/2017 FINDINGS: Transverse mildly displaced fracture through the medial malleolus. Suspected subtalar dislocation which is better assessed on concurrent foot exam. No definite distal fibular fracture. Tibial talar alignment is grossly maintained. Bones are diffusely under mineralized. There is generalized soft tissue edema of the ankle. IMPRESSION: 1. Transverse mildly displaced fracture through the medial malleolus. 2. Suspected subtalar  dislocation which is better assessed on concurrent foot exam. 3. Generalized soft tissue edema of the ankle. Electronically Signed   By: Keith Rake M.D.   On: 06/14/2020 17:57   CT Ankle Left Wo Contrast  Result Date: 06/14/2020 CLINICAL DATA:  Ankle pain and fractures EXAM: CT OF THE LEFT ANKLE WITHOUT CONTRAST TECHNIQUE: Multidetector CT imaging of the left ankle was performed according to the standard protocol. Multiplanar CT image reconstructions were also generated. COMPARISON:  Radiographs 06/14/2020 FINDINGS: Despite efforts by the technologist and patient, severe motion artifact is present on today's exam and could not be eliminated. This reduces exam sensitivity and specificity. Bones/Joint/Cartilage Transverse fracture of the medial malleolus. Avulsion fracture from the distal fibula anteriorly in the vicinity of the attachment site of the anterior inferior tibiofibular ligament. The multiplanar reconstructed images suggest the possibility of a posterior malleolar fracture but this is probably mainly due to motion artifact rather than a definite true fracture. As best I can tell there is widening of the space between the talus and the fibula and displacement of the transverse medial malleolar fracture characteristic of instability. If there is an oblique Weber B type lateral malleolar fracture, it remains of cold, with the only well-defined distal fibular fracture being along the anterior head. Extensive hindfoot and midfoot spurring. Prominent talonavicular valgus with some mild subluxation. Expected alignment at the Lisfranc joint. Vascular remnant along the calcaneal neck. Plantar and Achilles calcaneal spurs. Ligaments Suboptimally assessed by CT. Muscles and Tendons I am skeptical of flexor tendon entrapment along the medial malleolar fracture although it is difficult to completely exclude given the motion artifact and blurring of the structures. Soft tissues Subcutaneous edema along the  ankle. IMPRESSION: 1. Marked motion artifact obscures findings. 2. Transverse fracture of the medial malleolus, with suspected widening of the talofibular space suggesting instability, and a fracture of the anterior fibula likely at the attachment site of the anterior inferior tibiofibular ligament. If there are other fractures, there are obscured by the motion artifact. 3. I am skeptical of flexor tendon entrapment along the medial malleolar fracture although it is difficult to completely  exclude given the motion artifact and blurring of the structures. 4. Extensive hindfoot and midfoot spurring. 5. Prominent talonavicular valgus with some mild subluxation. 6. Subcutaneous edema along the ankle. Electronically Signed   By: Van Clines M.D.   On: 06/14/2020 20:12   DG CHEST PORT 1 VIEW  Result Date: 06/20/2020 CLINICAL DATA:  Generalized weakness. Follow-up of basilar atelectasis. EXAM: PORTABLE CHEST 1 VIEW COMPARISON:  06/17/2020 and earlier. FINDINGS: Cardiac silhouette mildly enlarged, unchanged. Mild pulmonary venous hypertension without overt edema. Increased streaky and patchy airspace opacities at the RIGHT lung base since the examination 3 days ago. Stable mild atelectasis at the LEFT lung base. Note is again made of the chronic BILATERAL shoulder subluxation/dislocation. IMPRESSION: 1. Worsening atelectasis and/or pneumonia at the RIGHT lung base. 2. Stable mild LEFT basilar atelectasis. 3. Stable mild cardiomegaly without pulmonary edema. Electronically Signed   By: Evangeline Dakin M.D.   On: 06/20/2020 15:02   DG CHEST PORT 1 VIEW  Result Date: 06/17/2020 CLINICAL DATA:  Respiratory distress EXAM: PORTABLE CHEST 1 VIEW COMPARISON:  06/15/2020 FINDINGS: Single frontal view of the chest demonstrates an unremarkable cardiac silhouette. Mild diffuse interstitial prominence is unchanged, compatible with scarring. No focal consolidation, effusion, or pneumothorax. Stable anterior subluxation or  dislocation of the bilateral glenohumeral joints. IMPRESSION: 1. Stable exam, no acute process. Electronically Signed   By: Randa Ngo M.D.   On: 06/17/2020 15:47   DG CHEST PORT 1 VIEW  Result Date: 06/15/2020 CLINICAL DATA:  Shortness of breath EXAM: PORTABLE CHEST 1 VIEW COMPARISON:  04/06/2017 FINDINGS: Shallow inspiration. Heart size and pulmonary vascularity are normal for technique. Lungs are clear. No pleural effusions. No pneumothorax. Mediastinal contours appear intact. There appear to be anterior shoulder dislocations bilaterally. Old bilateral rib fractures. IMPRESSION: No evidence of active pulmonary disease. Bilateral anterior shoulder dislocations. Electronically Signed   By: Lucienne Capers M.D.   On: 06/15/2020 03:35   DG Knee Left Port  Result Date: 06/15/2020 CLINICAL DATA:  Knee pain EXAM: PORTABLE LEFT KNEE - 1-2 VIEW COMPARISON:  None. FINDINGS: Previous left total knee arthroplasty. Patella femoral component is present. Components appear well seated. No evidence of acute fracture or dislocation. No focal bone lesion. No significant effusions. IMPRESSION: Left total knee arthroplasty. Components appear well seated. No acute bony abnormalities. Electronically Signed   By: Lucienne Capers M.D.   On: 06/15/2020 03:36   DG Knee Right Port  Result Date: 06/15/2020 CLINICAL DATA:  Knee pain EXAM: PORTABLE RIGHT KNEE - 1-2 VIEW COMPARISON:  01/22/2020 FINDINGS: Right total knee arthroplasty including patellar femoral component. Components appear well seated. No evidence of acute fracture or dislocation. No focal bone lesions. No significant effusions. No change since prior study. IMPRESSION: Right total knee arthroplasty. Components appear well seated. No acute bony abnormalities. Electronically Signed   By: Lucienne Capers M.D.   On: 06/15/2020 03:37   DG Foot Complete Left  Result Date: 06/14/2020 CLINICAL DATA:  Left ankle deformity. Patient states does not know what happened has  been in this position for 2 weeks. EXAM: LEFT FOOT - COMPLETE 3+ VIEW COMPARISON:  None. FINDINGS: Suspected subtalar dislocation with lateral angulation of the majority of the foot with respect to the talus. Tibial talar alignment is maintained. There is lateral subluxation of the navicular at the talonavicular joint. No obvious acute foot fracture. Suspect remote fifth proximal phalanx fracture. There is a plantar calcaneal spur. Bones are diffusely under mineralized. Generalized soft tissue edema throughout the foot IMPRESSION:  1. Suspected subtalar dislocation with lateral angulation of the majority of the foot with respect to the talus. Lateral subluxation of the navicular at the talonavicular joint. 2. No obvious acute foot fracture. 3. Diffuse osteopenia/osteoporosis with generalized soft tissue edema. Electronically Signed   By: Keith Rake M.D.   On: 06/14/2020 18:02    Microbiology: Recent Results (from the past 240 hour(s))  SARS Coronavirus 2 by RT PCR (hospital order, performed in [facility] hospital lab) Nasopharyngeal Nasopharyngeal Swab     Status: None   Collection Time: 06/19/20  5:13 PM   Specimen: Nasopharyngeal Swab  Result Value Ref Range Status   SARS Coronavirus 2 NEGATIVE NEGATIVE Final    Comment: (NOTE) SARS-CoV-2 target nucleic acids are NOT DETECTED.  The SARS-CoV-2 RNA is generally detectable in upper and lower respiratory specimens during the acute phase of infection. The lowest concentration of SARS-CoV-2 viral copies this assay can detect is 250 copies / mL. A negative result does not preclude SARS-CoV-2 infection and should not be used as the sole basis for treatment or other patient management decisions.  A negative result may occur with improper specimen collection / handling, submission of specimen other than nasopharyngeal swab, presence of viral mutation(s) within the areas targeted by this assay, and inadequate number of viral copies (<250 copies /  mL). A negative result must be combined with clinical observations, patient history, and epidemiological information.  Fact Sheet for Patients:   StrictlyIdeas.no  Fact Sheet for Healthcare Providers: BankingDealers.co.za  This test is not yet approved or  cleared by the [place] FDA and has been authorized for detection and/or diagnosis of SARS-CoV-2 by FDA under an Emergency Use Authorization (EUA).  This EUA will remain in effect (meaning this test can be used) for the duration of the COVID-19 declaration under Section 564(b)(1) of the Act, 21 U.S.C. section 360bbb-3(b)(1), unless the authorization is terminated or revoked sooner.  Performed at [facility], [place] [address], [place], [place] [address]   SARS Coronavirus 2 by RT PCR (hospital order, performed in [facility] hospital lab) Nasopharyngeal Nasopharyngeal Swab     Status: None   Collection Time: 06/26/20 12:00 PM   Specimen: Nasopharyngeal Swab  Result Value Ref Range Status   SARS Coronavirus 2 NEGATIVE NEGATIVE Final    Comment: (NOTE) SARS-CoV-2 target nucleic acids are NOT DETECTED.  The SARS-CoV-2 RNA is generally detectable in upper and lower respiratory specimens during the acute phase of infection. The lowest concentration of SARS-CoV-2 viral copies this assay can detect is 250 copies / mL. A negative result does not preclude SARS-CoV-2 infection and should not be used as the sole basis for treatment or other patient management decisions.  A negative result may occur with improper specimen collection / handling, submission of specimen other than nasopharyngeal swab, presence of viral mutation(s) within the areas targeted by this assay, and inadequate number of viral copies (<250 copies / mL). A negative result must be combined with clinical observations, patient history, and epidemiological information.  Fact Sheet for Patients:    StrictlyIdeas.no  Fact Sheet for Healthcare Providers: BankingDealers.co.za  This test is not yet approved or  cleared by the [place] FDA and has been authorized for detection and/or diagnosis of SARS-CoV-2 by FDA under an Emergency Use Authorization (EUA).  This EUA will remain in effect (meaning this test can be used) for the duration of the COVID-19 declaration under Section 564(b)(1) of the Act, 21 U.S.C. section 360bbb-3(b)(1), unless the  authorization is terminated or revoked sooner.  Performed at [facility], [place] [address], [place], [place] [address]      Labs: Basic Metabolic Panel: Recent Labs  Lab 06/22/20 1540 06/25/20 0753 06/27/20 0700  NA 137 134* 135  K 4.1 5.3* 5.2*  CL 97* 99 97*  CO2 '30 27 28  ' GLUCOSE 169* 125* 81  BUN 38* 41* 36*  CREATININE 6.08* 6.76* 6.69*  CALCIUM 9.2 8.7* 9.2  PHOS 5.3* 5.0* 5.7*   Liver Function Tests: Recent Labs  Lab 06/22/20 1540 06/25/20 0753 06/27/20 0700  ALBUMIN 2.6* 2.5* 2.3*   CBC: Recent Labs  Lab 06/22/20 1540 06/25/20 0753 06/26/20 0242 06/27/20 0700  WBC 10.0 10.6* 11.0* 10.7*  HGB 10.1* 8.6* 8.8* 8.6*  HCT 34.3* 28.5* 29.2* 29.3*  MCV 102.1* 100.4* 102.1* 102.4*  PLT 237 227 224 258   CBG: Recent Labs  Lab 06/26/20 0541 06/26/20 0612 06/26/20 1128 06/26/20 1702 06/26/20 2050  GLUCAP 111* 102* 106* 101* 101*    Principal Problem:   Ankle fracture Active Problems:   Acute on chronic respiratory failure with hypoxia and hypercapnia (HCC)   Acute blood loss anemia   Insulin-requiring or dependent type II diabetes mellitus (HCC)   Essential hypertension   Pressure injury of skin   End stage renal disease (HCC)   HLD (hyperlipidemia)   Primary osteoarthritis, left ankle and foot   Advanced care planning/counseling discussion   Goals of care, counseling/discussion   Obstructive sleep apnea   Time coordinating discharge: 45  minutes  Signed:  Murray Hodgkins, MD  [facility]  06/27/2020, 2:20 PM

## 2020-06-26 NOTE — Progress Notes (Signed)
Patient's brother is at the bedside and had concerns about disposition. CSW is on the unit and was made aware.

## 2020-06-26 NOTE — Progress Notes (Addendum)
Unable to assess patient's sacral region. Patient did not want to turn to side because of ankle surgery taken place on Wednesday. Patient refused mobility all shift due to the pain from the ankle surgery. Patient's oncoming RN is aware.

## 2020-06-26 NOTE — Progress Notes (Signed)
Occupational Therapy Treatment Patient Details Name: Jean Mcclain MRN: [id number] DOB: 02/20/1946 Today's Date: 06/26/2020    History of present illness 74 year old female with history of prior DVT on Eliquis, ESRD on HD MWF, IDDM, morbid obesity with a BMI of 40, OSA not on CPAP, hypertension, hyperlipidemia, GERD, nightly oxygen (2L) came into the hospital with worsening left ankle and foot pain with decreased mobility. This has been going on for a while, does not really recall an injury to the foot. Imaging of the left foot in the ED showed transverse mildly displaced fracture to the medial malleolus, subtalar dislocation with lateral angulation of the majority of the foot, lateral subluxation of the navicular at the talonavicular joint. Orthopedic surgery was consulted and she was admitted to the hospital.  Plan for surgery on 9/8 but patient was confused and lethargic at times; s/p 9/15 tibial calcaneal fusion on the left   OT comments  Pt plans to DC to SNF this day  OT session focused on I with self feeding and educating pt to communicate to staff in regards to her needs in regards to positioning  Follow Up Recommendations  SNF    Equipment Recommendations  Other (comment) (defer to next venue)                  ADL either performed or assessed with clinical judgement   ADL   Eating/Feeding: Minimal assistance;Bed level Eating/Feeding Details (indicate cue type and reason): focused on positoning for I with self feeding.  Propped BUE on pillows to encourage BUE more in midline position.  Educated pt to communicate with staff in regards to positining needs Grooming: Moderate assistance;Wash/dry face;Bed level                                                 Cognition Arousal/Alertness: Awake/alert Behavior During Therapy: Flat affect Overall Cognitive Status: Within Functional Limits for tasks assessed                                                Frequency  Min 2X/week        Progress Toward Goals  OT Goals(current goals can now be found in the care plan section)  Progress towards OT goals: Progressing toward goals     Plan Discharge plan remains appropriate    Co-evaluation                 AM-PAC OT "6 Clicks" Daily Activity     Outcome Measure   Help from another person eating meals?: A Little Help from another person taking care of personal grooming?: A Lot Help from another person toileting, which includes using toliet, bedpan, or urinal?: Total Help from another person bathing (including washing, rinsing, drying)?: Total Help from another person to put on and taking off regular upper body clothing?: Total Help from another person to put on and taking off regular lower body clothing?: Total 6 Click Score: 9    End of Session    OT Visit Diagnosis: Unsteadiness on feet (R26.81);Other abnormalities of gait and mobility (R26.89);Muscle weakness (generalized) (M62.81);Pain   Activity Tolerance Patient tolerated treatment well   Patient Left in bed;with call bell/phone within reach  Nurse Communication Other (comment) (NT to help pt position to eat and use foam build up)        Time: 1245-1305 OT Time Calculation (min): 20 min  Charges: OT General Charges $OT Visit: 1 Visit OT Treatments $Self Care/Home Management : 8-22 mins  Jean Mcclain, Jean Mcclain [phone] Office- Jean Mcclain, Jean Mcclain 06/26/2020, 3:25 PM

## 2020-06-26 NOTE — Progress Notes (Addendum)
Nephrology Follow-Up Consult note   Assessment/Recommendations: Jean Mcclain is a/an 74 y.o. female with a past medical history significant for DVT on eliquis, ESRD, DM, obesity, OSA< HTN, HLD, admitted for ankle pain.     Dialysis: [facility] MWF  4h 50min  90.5kg  2/2 bath  AVF  15g  Hep 3000 Hectorol 2 mcg IV q HD  1. Chronic ankle and subtalar arthritis with acute medial malleolar fracture: s/p  left tibiocalcaneal fusion w/ Dr. Sharol Given on 9/15. Likely require PT. Pain control per primary team 2. A/C resp failure w/ hypercapnia d/t OSA/ OHS:  Stable at this time.  Bipap nightly. Continue to challenge EDW. 3. ESRD:usual HD is MWF. TTS schedule for now given had an off day on procedure. Would plan to dialyze Saturday then Monday if she remains inpatient 4. BP/ volume:  BP acceptable. Challenging EDW 5. Anemia:Hgb 8.8 on 9/17, we started esa here 40 ug on 9/09, will increase to 10mcg 6. Hx of DVT: perioperative anticoagulation per priamry 7. Metabolic bone disease:Continue hecterol and phos binder with meals 8. Nutrition:Renal/carb modified diet.  9. T2DM: On insulin, per primary team   Recommendations conveyed to primary service.    [facility] 06/26/2020 10:12 AM  ___________________________________________________________  CC: ESRD  Interval History/Subjective: Patient tolerated dialysis yesterday.  States that she has had persistent pain in her ankle after surgery that is now controlled.  Does not describe any other complaints.  Medications:  Current Facility-Administered Medications  Medication Dose Route Frequency Provider Last Rate Last Admin  . 0.9 %  sodium chloride infusion   Intravenous Continuous Persons, Bevely Palmer, [place]   Stopped at 06/25/20 1757  . acetaminophen (TYLENOL) tablet 650 mg  650 mg Oral Q6H PRN Persons, Bevely Palmer, [place]   650 mg at 06/21/20 2218   Or  . acetaminophen (TYLENOL) suppository 650 mg  650 mg Rectal Q6H PRN  Persons, Bevely Palmer, PA      . calcium acetate (PHOSLO) capsule 667 mg  667 mg Oral TID with meals Persons, Bevely Palmer, [place]   667 mg at 06/26/20 0825  . calcium acetate (PHOSLO) capsule 667 mg  667 mg Oral Daily PRN Persons, Bevely Palmer, PA      . carbidopa-levodopa (SINEMET IR) 10-100 MG per tablet immediate release 1 tablet  1 tablet Oral BID Persons, Bevely Palmer, [place]   1 tablet at 06/26/20 0825  . Chlorhexidine Gluconate Cloth 2 % PADS 6 each  6 each Topical Q0600 Persons, Bevely Palmer, [place]   6 each at 06/26/20 [phone]  . colchicine tablet 0.3 mg  0.3 mg Oral Daily Persons, Bevely Palmer, PA   0.3 mg at 06/26/20 0824  . Darbepoetin Alfa (ARANESP) injection 40 mcg  40 mcg Intravenous Q Wed-HD Persons, Bevely Palmer, PA   40 mcg at 06/25/20 1055  . diclofenac Sodium (VOLTAREN) 1 % topical gel 4 g  4 g Topical QID Persons, Bevely Palmer, PA   4 g at 06/26/20 0830  . diphenhydrAMINE (BENADRYL) capsule 25 mg  25 mg Oral Q6H PRN Samuella Cota, MD   25 mg at 06/26/20 7425  . docusate sodium (COLACE) capsule 100 mg  100 mg Oral BID PRN Persons, Bevely Palmer, PA   100 mg at 06/23/20 2248  . docusate sodium (COLACE) capsule 100 mg  100 mg Oral BID Persons, Bevely Palmer, PA   100 mg at 06/26/20 9563  . febuxostat (ULORIC) tablet 40 mg  40 mg Oral Daily  Persons, Bevely Palmer, [place]   40 mg at 06/26/20 0825  . gabapentin (NEURONTIN) capsule 100 mg  100 mg Oral QHS Persons, Bevely Palmer, PA   100 mg at 06/25/20 2131  . HYDROmorphone (DILAUDID) injection 0.5 mg  0.5 mg Intravenous Q4H PRN Persons, Bevely Palmer, PA   0.5 mg at 06/25/20 1055  . insulin aspart (novoLOG) injection 0-9 Units  0-9 Units Subcutaneous TID WC Persons, Bevely Palmer, [place]   2 Units at 06/25/20 1712  . insulin NPH Human (NOVOLIN N) injection 10 Units  10 Units Subcutaneous BID AC & HS Persons, Bevely Palmer, [place]   10 Units at 06/26/20 [phone]  . levalbuterol (XOPENEX) nebulizer solution 0.63 mg  0.63 mg Nebulization Q8H PRN Persons, Bevely Palmer, PA   0.63 mg at 06/15/20 0324  . multivitamin  (RENA-VIT) tablet 1 tablet  1 tablet Oral Daily Persons, Bevely Palmer, [place]   1 tablet at 06/26/20 [phone]  . Muscle Rub CREA   Topical PRN Persons, Bevely Palmer, PA      . ondansetron [facility]) injection 4 mg  4 mg Intravenous Q6H PRN Persons, Bevely Palmer, PA   4 mg at 06/24/20 0902  . oxyCODONE (Oxy IR/ROXICODONE) immediate release tablet 5 mg  5 mg Oral Q4H PRN Persons, Bevely Palmer, PA   5 mg at 06/26/20 4403  . pantoprazole (PROTONIX) EC tablet 40 mg  40 mg Oral Daily Persons, Bevely Palmer, PA   40 mg at 06/26/20 0825  . polyvinyl alcohol (LIQUIFILM TEARS) 1.4 % ophthalmic solution 1 drop  1 drop Both Eyes PRN Persons, Bevely Palmer, PA      . rOPINIRole (REQUIP) tablet 4 mg  4 mg Oral BID Persons, Bevely Palmer, PA   4 mg at 06/26/20 0825  . sodium chloride (OCEAN) 0.65 % nasal spray 1 spray  1 spray Each Nare PRN Persons, Bevely Palmer, [place]   1 spray at 06/23/20 2218  . Zinc Oxide (TRIPLE PASTE) 12.8 % ointment   Topical QID Persons, Bevely Palmer, [place]   Given at 06/26/20 0830      Review of Systems: 10 systems reviewed and negative except per interval history/subjective  Physical Exam: Vitals:   06/26/20 0023 06/26/20 0531  BP:  (!) 97/42  Pulse: 86 74  Resp: 14 17  Temp:  98.8 F (37.1 C)  SpO2:  98%   Total I/O In: 120 [P.O.:120] Out: -   Intake/Output Summary (Last 24 hours) at 06/26/2020 1012 Last data filed at 06/26/2020 0810 Gross per 24 hour  Intake 566.96 ml  Output 1812 ml  Net -1245.04 ml   Constitutional: chronically ill appearing no distress ENMT: ears and nose without scars or lesions, MMM CV: normal rate, trace edema, left ankle wrapped Respiratory: Bilateral chest rise, no increased work of breathing Gastrointestinal: soft, non-tender, no palpable masses or hernias Skin: seborrhea on nose and cheeks, otherwise no visible lesions or rashes Psych: alert, judgement/insight appropriate, appropriate mood and affect   Test Results I personally reviewed new and old clinical labs and radiology  tests Lab Results  Component Value Date   NA 134 (L) 06/25/2020   K 5.3 (H) 06/25/2020   CL 99 06/25/2020   CO2 27 06/25/2020   BUN 41 (H) 06/25/2020   CREATININE 6.76 (H) 06/25/2020   CALCIUM 8.7 (L) 06/25/2020   ALBUMIN 2.5 (L) 06/25/2020   PHOS 5.0 (H) 06/25/2020

## 2020-06-26 NOTE — Care Management Important Message (Signed)
Important Message  Patient Details  Name: Jean Mcclain MRN: [id number] Date of Birth: 09-Oct-1946   Medicare Important Message Given:  Yes     Shelda Altes 06/26/2020, 9:11 AM

## 2020-06-27 DIAGNOSIS — E1129 Type 2 diabetes mellitus with other diabetic kidney complication: Secondary | ICD-10-CM | POA: Diagnosis not present

## 2020-06-27 DIAGNOSIS — Z23 Encounter for immunization: Secondary | ICD-10-CM | POA: Diagnosis not present

## 2020-06-27 DIAGNOSIS — N261 Atrophy of kidney (terminal): Secondary | ICD-10-CM | POA: Diagnosis not present

## 2020-06-27 DIAGNOSIS — S4991XA Unspecified injury of right shoulder and upper arm, initial encounter: Secondary | ICD-10-CM | POA: Diagnosis present

## 2020-06-27 DIAGNOSIS — I10 Essential (primary) hypertension: Secondary | ICD-10-CM | POA: Diagnosis not present

## 2020-06-27 DIAGNOSIS — E119 Type 2 diabetes mellitus without complications: Secondary | ICD-10-CM | POA: Diagnosis not present

## 2020-06-27 DIAGNOSIS — I5033 Acute on chronic diastolic (congestive) heart failure: Secondary | ICD-10-CM | POA: Diagnosis not present

## 2020-06-27 DIAGNOSIS — N186 End stage renal disease: Secondary | ICD-10-CM | POA: Diagnosis not present

## 2020-06-27 DIAGNOSIS — M160 Bilateral primary osteoarthritis of hip: Secondary | ICD-10-CM | POA: Diagnosis not present

## 2020-06-27 DIAGNOSIS — N189 Chronic kidney disease, unspecified: Secondary | ICD-10-CM | POA: Diagnosis not present

## 2020-06-27 DIAGNOSIS — N281 Cyst of kidney, acquired: Secondary | ICD-10-CM | POA: Diagnosis not present

## 2020-06-27 DIAGNOSIS — Z87891 Personal history of nicotine dependence: Secondary | ICD-10-CM | POA: Diagnosis not present

## 2020-06-27 DIAGNOSIS — G4733 Obstructive sleep apnea (adult) (pediatric): Secondary | ICD-10-CM | POA: Diagnosis not present

## 2020-06-27 DIAGNOSIS — R278 Other lack of coordination: Secondary | ICD-10-CM | POA: Diagnosis not present

## 2020-06-27 DIAGNOSIS — J9621 Acute and chronic respiratory failure with hypoxia: Secondary | ICD-10-CM | POA: Diagnosis not present

## 2020-06-27 DIAGNOSIS — E1122 Type 2 diabetes mellitus with diabetic chronic kidney disease: Secondary | ICD-10-CM | POA: Diagnosis not present

## 2020-06-27 DIAGNOSIS — Z6834 Body mass index (BMI) 34.0-34.9, adult: Secondary | ICD-10-CM | POA: Diagnosis not present

## 2020-06-27 DIAGNOSIS — R2681 Unsteadiness on feet: Secondary | ICD-10-CM | POA: Diagnosis not present

## 2020-06-27 DIAGNOSIS — M25572 Pain in left ankle and joints of left foot: Secondary | ICD-10-CM | POA: Diagnosis not present

## 2020-06-27 DIAGNOSIS — J9622 Acute and chronic respiratory failure with hypercapnia: Secondary | ICD-10-CM | POA: Diagnosis not present

## 2020-06-27 DIAGNOSIS — R58 Hemorrhage, not elsewhere classified: Secondary | ICD-10-CM | POA: Diagnosis not present

## 2020-06-27 DIAGNOSIS — D509 Iron deficiency anemia, unspecified: Secondary | ICD-10-CM | POA: Diagnosis not present

## 2020-06-27 DIAGNOSIS — Z86718 Personal history of other venous thrombosis and embolism: Secondary | ICD-10-CM | POA: Diagnosis not present

## 2020-06-27 DIAGNOSIS — M542 Cervicalgia: Secondary | ICD-10-CM | POA: Diagnosis not present

## 2020-06-27 DIAGNOSIS — S82422D Displaced transverse fracture of shaft of left fibula, subsequent encounter for closed fracture with routine healing: Secondary | ICD-10-CM | POA: Diagnosis not present

## 2020-06-27 DIAGNOSIS — Z992 Dependence on renal dialysis: Secondary | ICD-10-CM | POA: Diagnosis not present

## 2020-06-27 DIAGNOSIS — S82892A Other fracture of left lower leg, initial encounter for closed fracture: Secondary | ICD-10-CM | POA: Diagnosis not present

## 2020-06-27 DIAGNOSIS — M24312 Pathological dislocation of left shoulder, not elsewhere classified: Secondary | ICD-10-CM | POA: Diagnosis not present

## 2020-06-27 DIAGNOSIS — J9602 Acute respiratory failure with hypercapnia: Secondary | ICD-10-CM | POA: Diagnosis not present

## 2020-06-27 DIAGNOSIS — S79929A Unspecified injury of unspecified thigh, initial encounter: Secondary | ICD-10-CM | POA: Diagnosis not present

## 2020-06-27 DIAGNOSIS — Z96652 Presence of left artificial knee joint: Secondary | ICD-10-CM | POA: Diagnosis not present

## 2020-06-27 DIAGNOSIS — E785 Hyperlipidemia, unspecified: Secondary | ICD-10-CM | POA: Diagnosis not present

## 2020-06-27 DIAGNOSIS — L89312 Pressure ulcer of right buttock, stage 2: Secondary | ICD-10-CM | POA: Diagnosis not present

## 2020-06-27 DIAGNOSIS — N939 Abnormal uterine and vaginal bleeding, unspecified: Secondary | ICD-10-CM | POA: Diagnosis present

## 2020-06-27 DIAGNOSIS — E662 Morbid (severe) obesity with alveolar hypoventilation: Secondary | ICD-10-CM | POA: Diagnosis not present

## 2020-06-27 DIAGNOSIS — N858 Other specified noninflammatory disorders of uterus: Secondary | ICD-10-CM | POA: Diagnosis not present

## 2020-06-27 DIAGNOSIS — M19072 Primary osteoarthritis, left ankle and foot: Secondary | ICD-10-CM | POA: Diagnosis not present

## 2020-06-27 DIAGNOSIS — S199XXA Unspecified injury of neck, initial encounter: Secondary | ICD-10-CM | POA: Diagnosis not present

## 2020-06-27 DIAGNOSIS — S322XXA Fracture of coccyx, initial encounter for closed fracture: Secondary | ICD-10-CM | POA: Diagnosis not present

## 2020-06-27 DIAGNOSIS — S0990XA Unspecified injury of head, initial encounter: Secondary | ICD-10-CM | POA: Diagnosis not present

## 2020-06-27 DIAGNOSIS — S4992XA Unspecified injury of left shoulder and upper arm, initial encounter: Secondary | ICD-10-CM | POA: Diagnosis not present

## 2020-06-27 DIAGNOSIS — E1022 Type 1 diabetes mellitus with diabetic chronic kidney disease: Secondary | ICD-10-CM | POA: Diagnosis not present

## 2020-06-27 DIAGNOSIS — R633 Feeding difficulties, unspecified: Secondary | ICD-10-CM | POA: Diagnosis not present

## 2020-06-27 DIAGNOSIS — J9612 Chronic respiratory failure with hypercapnia: Secondary | ICD-10-CM | POA: Diagnosis not present

## 2020-06-27 DIAGNOSIS — Z7401 Bed confinement status: Secondary | ICD-10-CM | POA: Diagnosis not present

## 2020-06-27 DIAGNOSIS — W050XXA Fall from non-moving wheelchair, initial encounter: Secondary | ICD-10-CM | POA: Diagnosis not present

## 2020-06-27 DIAGNOSIS — G8918 Other acute postprocedural pain: Secondary | ICD-10-CM | POA: Diagnosis not present

## 2020-06-27 DIAGNOSIS — M79672 Pain in left foot: Secondary | ICD-10-CM | POA: Diagnosis not present

## 2020-06-27 DIAGNOSIS — R6889 Other general symptoms and signs: Secondary | ICD-10-CM | POA: Diagnosis not present

## 2020-06-27 DIAGNOSIS — N950 Postmenopausal bleeding: Secondary | ICD-10-CM | POA: Diagnosis not present

## 2020-06-27 DIAGNOSIS — R5381 Other malaise: Secondary | ICD-10-CM | POA: Diagnosis not present

## 2020-06-27 DIAGNOSIS — S91302D Unspecified open wound, left foot, subsequent encounter: Secondary | ICD-10-CM | POA: Diagnosis not present

## 2020-06-27 DIAGNOSIS — N250 Renal osteodystrophy: Secondary | ICD-10-CM | POA: Diagnosis not present

## 2020-06-27 DIAGNOSIS — I120 Hypertensive chronic kidney disease with stage 5 chronic kidney disease or end stage renal disease: Secondary | ICD-10-CM | POA: Diagnosis not present

## 2020-06-27 DIAGNOSIS — E669 Obesity, unspecified: Secondary | ICD-10-CM | POA: Diagnosis not present

## 2020-06-27 DIAGNOSIS — Z4789 Encounter for other orthopedic aftercare: Secondary | ICD-10-CM | POA: Diagnosis not present

## 2020-06-27 DIAGNOSIS — R109 Unspecified abdominal pain: Secondary | ICD-10-CM | POA: Diagnosis not present

## 2020-06-27 DIAGNOSIS — R531 Weakness: Secondary | ICD-10-CM | POA: Diagnosis not present

## 2020-06-27 DIAGNOSIS — D689 Coagulation defect, unspecified: Secondary | ICD-10-CM | POA: Diagnosis not present

## 2020-06-27 DIAGNOSIS — R0902 Hypoxemia: Secondary | ICD-10-CM | POA: Diagnosis not present

## 2020-06-27 DIAGNOSIS — Z96651 Presence of right artificial knee joint: Secondary | ICD-10-CM | POA: Diagnosis not present

## 2020-06-27 DIAGNOSIS — L89321 Pressure ulcer of left buttock, stage 1: Secondary | ICD-10-CM | POA: Diagnosis not present

## 2020-06-27 DIAGNOSIS — D631 Anemia in chronic kidney disease: Secondary | ICD-10-CM | POA: Diagnosis not present

## 2020-06-27 DIAGNOSIS — M24311 Pathological dislocation of right shoulder, not elsewhere classified: Secondary | ICD-10-CM | POA: Diagnosis not present

## 2020-06-27 DIAGNOSIS — S3210XA Unspecified fracture of sacrum, initial encounter for closed fracture: Secondary | ICD-10-CM | POA: Diagnosis not present

## 2020-06-27 DIAGNOSIS — M5489 Other dorsalgia: Secondary | ICD-10-CM | POA: Diagnosis not present

## 2020-06-27 DIAGNOSIS — Z7901 Long term (current) use of anticoagulants: Secondary | ICD-10-CM | POA: Diagnosis not present

## 2020-06-27 DIAGNOSIS — W19XXXA Unspecified fall, initial encounter: Secondary | ICD-10-CM | POA: Diagnosis not present

## 2020-06-27 DIAGNOSIS — M533 Sacrococcygeal disorders, not elsewhere classified: Secondary | ICD-10-CM | POA: Diagnosis present

## 2020-06-27 DIAGNOSIS — L97521 Non-pressure chronic ulcer of other part of left foot limited to breakdown of skin: Secondary | ICD-10-CM | POA: Diagnosis not present

## 2020-06-27 DIAGNOSIS — S82899A Other fracture of unspecified lower leg, initial encounter for closed fracture: Secondary | ICD-10-CM | POA: Diagnosis not present

## 2020-06-27 DIAGNOSIS — E1165 Type 2 diabetes mellitus with hyperglycemia: Secondary | ICD-10-CM | POA: Diagnosis not present

## 2020-06-27 DIAGNOSIS — Z1151 Encounter for screening for human papillomavirus (HPV): Secondary | ICD-10-CM | POA: Diagnosis not present

## 2020-06-27 DIAGNOSIS — M2550 Pain in unspecified joint: Secondary | ICD-10-CM | POA: Diagnosis not present

## 2020-06-27 DIAGNOSIS — I739 Peripheral vascular disease, unspecified: Secondary | ICD-10-CM | POA: Diagnosis not present

## 2020-06-27 DIAGNOSIS — M6281 Muscle weakness (generalized): Secondary | ICD-10-CM | POA: Diagnosis not present

## 2020-06-27 DIAGNOSIS — E118 Type 2 diabetes mellitus with unspecified complications: Secondary | ICD-10-CM | POA: Diagnosis not present

## 2020-06-27 DIAGNOSIS — R279 Unspecified lack of coordination: Secondary | ICD-10-CM | POA: Diagnosis not present

## 2020-06-27 DIAGNOSIS — L89623 Pressure ulcer of left heel, stage 3: Secondary | ICD-10-CM | POA: Diagnosis not present

## 2020-06-27 DIAGNOSIS — S322XXD Fracture of coccyx, subsequent encounter for fracture with routine healing: Secondary | ICD-10-CM | POA: Diagnosis not present

## 2020-06-27 DIAGNOSIS — Z794 Long term (current) use of insulin: Secondary | ICD-10-CM | POA: Diagnosis not present

## 2020-06-27 DIAGNOSIS — I129 Hypertensive chronic kidney disease with stage 1 through stage 4 chronic kidney disease, or unspecified chronic kidney disease: Secondary | ICD-10-CM | POA: Diagnosis not present

## 2020-06-27 DIAGNOSIS — E876 Hypokalemia: Secondary | ICD-10-CM | POA: Diagnosis not present

## 2020-06-27 DIAGNOSIS — L989 Disorder of the skin and subcutaneous tissue, unspecified: Secondary | ICD-10-CM | POA: Diagnosis not present

## 2020-06-27 DIAGNOSIS — M47816 Spondylosis without myelopathy or radiculopathy, lumbar region: Secondary | ICD-10-CM | POA: Diagnosis not present

## 2020-06-27 DIAGNOSIS — D259 Leiomyoma of uterus, unspecified: Secondary | ICD-10-CM | POA: Diagnosis not present

## 2020-06-27 DIAGNOSIS — E1169 Type 2 diabetes mellitus with other specified complication: Secondary | ICD-10-CM | POA: Diagnosis not present

## 2020-06-27 DIAGNOSIS — Z01419 Encounter for gynecological examination (general) (routine) without abnormal findings: Secondary | ICD-10-CM | POA: Diagnosis not present

## 2020-06-27 DIAGNOSIS — G319 Degenerative disease of nervous system, unspecified: Secondary | ICD-10-CM | POA: Diagnosis not present

## 2020-06-27 DIAGNOSIS — R52 Pain, unspecified: Secondary | ICD-10-CM | POA: Diagnosis not present

## 2020-06-27 DIAGNOSIS — I959 Hypotension, unspecified: Secondary | ICD-10-CM | POA: Diagnosis not present

## 2020-06-27 DIAGNOSIS — Z743 Need for continuous supervision: Secondary | ICD-10-CM | POA: Diagnosis not present

## 2020-06-27 LAB — RENAL FUNCTION PANEL
Albumin: 2.3 g/dL — ABNORMAL LOW (ref 3.5–5.0)
Anion gap: 10 (ref 5–15)
BUN: 36 mg/dL — ABNORMAL HIGH (ref 8–23)
CO2: 28 mmol/L (ref 22–32)
Calcium: 9.2 mg/dL (ref 8.9–10.3)
Chloride: 97 mmol/L — ABNORMAL LOW (ref 98–111)
Creatinine, Ser: 6.69 mg/dL — ABNORMAL HIGH (ref 0.44–1.00)
GFR calc Af Amer: 6 mL/min — ABNORMAL LOW (ref >60)
GFR calc non Af Amer: 6 mL/min — ABNORMAL LOW (ref >60)
Glucose, Bld: 81 mg/dL (ref 70–99)
Phosphorus: 5.7 mg/dL — ABNORMAL HIGH (ref 2.5–4.6)
Potassium: 5.2 mmol/L — ABNORMAL HIGH (ref 3.5–5.1)
Sodium: 135 mmol/L (ref 135–145)

## 2020-06-27 LAB — GLUCOSE, CAPILLARY
Glucose-Capillary: 80 mg/dL (ref 70–99)
Glucose-Capillary: 87 mg/dL (ref 70–99)
Glucose-Capillary: 92 mg/dL (ref 70–99)

## 2020-06-27 LAB — CBC
HCT: 29.3 % — ABNORMAL LOW (ref 36.0–46.0)
Hemoglobin: 8.6 g/dL — ABNORMAL LOW (ref 12.0–15.0)
MCH: 30.1 pg (ref 26.0–34.0)
MCHC: 29.4 g/dL — ABNORMAL LOW (ref 30.0–36.0)
MCV: 102.4 fL — ABNORMAL HIGH (ref 80.0–100.0)
Platelets: 258 10*3/uL (ref 150–400)
RBC: 2.86 MIL/uL — ABNORMAL LOW (ref 3.87–5.11)
RDW: 14.9 % (ref 11.5–15.5)
WBC: 10.7 10*3/uL — ABNORMAL HIGH (ref 4.0–10.5)
nRBC: 0 % (ref 0.0–0.2)

## 2020-06-27 MED ORDER — OXYCODONE HCL 5 MG PO TABS
ORAL_TABLET | ORAL | Status: AC
  Filled 2020-06-27: qty 1

## 2020-06-27 MED ORDER — APIXABAN 2.5 MG PO TABS
2.5000 mg | ORAL_TABLET | Freq: Two times a day (BID) | ORAL | Status: DC
  Administered 2020-06-27: 2.5 mg via ORAL
  Filled 2020-06-27: qty 1

## 2020-06-27 NOTE — Progress Notes (Signed)
Gave SNF report to [facility] RN at [place]

## 2020-06-27 NOTE — Progress Notes (Signed)
   06/27/20 1152  Hand-Off documentation  Handoff Given Given to shift RN/LPN  Report given to (Full Name) Lamelva King,RN  Handoff Received Received from Transfer Unit/facility  Report received from (Full Name) [name],RN  Vital Signs  Temp 97.8 F (36.6 C)  Temp Source Oral  Pulse Rate 86  Pulse Rate Source Monitor  Resp (!) 25  BP (!) 106/54  BP Location Right Leg  BP Method Automatic  Patient Position (if appropriate) Lying  Oxygen Therapy  SpO2 97 %  Post-Hemodialysis Assessment  Rinseback Volume (mL) 250 mL  KECN 225 V  Dialyzer Clearance Lightly streaked  Duration of HD Treatment -hour(s) 3.5 hour(s)  Hemodialysis Intake (mL) 500 mL  UF Total -Machine (mL) 2285 mL  Net UF (mL) 1785 mL  Tolerated HD Treatment Yes  Post-Hemodialysis Comments tx completed  AVG/AVF Arterial Site Held (minutes) 10 minutes  AVG/AVF Venous Site Held (minutes) 10 minutes  Education / Care Plan  Dialysis Education Provided Yes  Documented Education in Care Plan Yes  Fistula / Graft Right Upper arm Arteriovenous fistula  Placement Date/Time: 08/20/18 4373   Orientation: Right  Access Location: Upper arm  Access Type: (c) Arteriovenous fistula  Site Condition No complications  Fistula / Graft Assessment Present;Thrill;Bruit  Status Deaccessed  Needle Size 15  Drainage Description None  HD tx complete-pt stable. UF=1.7 liters, BP running SYS-70s-80s intermittently throughout tx, UF stopped at those times and then resumed.

## 2020-06-27 NOTE — Progress Notes (Signed)
Patient ID: Jean Mcclain, female   DOB: [birth year], 74 y.o.   MRN: [id number] Patient is status post tibial calcaneal fusion she is in dialysis this morning.  Patient complains of ankle pain the dressing is clean and dry, no drainage in the wound VAC canister with VAC.

## 2020-06-27 NOTE — Progress Notes (Signed)
Nephrology Follow-Up Consult note   Assessment/Recommendations: Jean Mcclain is a/an 74 y.o. female with a past medical history significant for DVT on eliquis, ESRD, DM, obesity, OSA< HTN, HLD, admitted for ankle pain.     Dialysis: [facility] MWF  4h 76min  90.5kg  2/2 bath  AVF  15g  Hep 3000 Hectorol 2 mcg IV q HD  1. Chronic ankle and subtalar arthritis with acute medial malleolar fracture: s/p  left tibiocalcaneal fusion w/ Dr. Sharol Given on 9/15. Likely require PT. Pain control per primary team 2. A/C resp failure w/ hypercapnia d/t OSA/ OHS:  Stable at this time.  Bipap nightly. Continue to challenge EDW as tolerated 3. ESRD:usual HD is MWF. TTS schedule for now given had an off day on procedure.  Dialyzing today and then plan to resume schedule on Monday 4. BP/ volume:  BP acceptable. Challenging EDW 5. Anemia:Hgb 8.6 on 9/18, ESA increased to 60 mcg on 9/17 6. Hx of DVT: perioperative anticoagulation per priamry 7. Metabolic bone disease:Continue hecterol and phos binder with meals 8. Nutrition:Renal/carb modified diet.  Push protein 9. T2DM: On insulin, per primary team   Recommendations conveyed to primary service.    [facility] 06/27/2020 9:53 AM  ___________________________________________________________  CC: ESRD  Interval History/Subjective: Patient resting on dialysis.  Has indicated to staff that she is continued pain that is not controlled.  Otherwise no significant changes.  Medications:  Current Facility-Administered Medications  Medication Dose Route Frequency Provider Last Rate Last Admin  . 0.9 %  sodium chloride infusion   Intravenous Continuous Persons, Bevely Palmer, [place]   Stopped at 06/25/20 1757  . acetaminophen (TYLENOL) tablet 650 mg  650 mg Oral Q6H PRN Persons, Bevely Palmer, [place]   650 mg at 06/21/20 2218   Or  . acetaminophen (TYLENOL) suppository 650 mg  650 mg Rectal Q6H PRN Persons, Bevely Palmer, PA      . calcium  acetate (PHOSLO) capsule 667 mg  667 mg Oral TID with meals Persons, Bevely Palmer, [place]   667 mg at 06/26/20 1643  . calcium acetate (PHOSLO) capsule 667 mg  667 mg Oral Daily PRN Persons, Bevely Palmer, PA      . carbidopa-levodopa (SINEMET IR) 10-100 MG per tablet immediate release 1 tablet  1 tablet Oral BID Persons, Bevely Palmer, [place]   1 tablet at 06/26/20 1635  . Chlorhexidine Gluconate Cloth 2 % PADS 6 each  6 each Topical Q0600 Persons, Bevely Palmer, [place]   6 each at 06/26/20 [phone]  . colchicine tablet 0.3 mg  0.3 mg Oral Daily Persons, Bevely Palmer, PA   0.3 mg at 06/26/20 0824  . [START ON 07/01/2020] Darbepoetin Alfa (ARANESP) injection 60 mcg  60 mcg Intravenous Q Wed-HD Reesa Chew, MD      . diclofenac Sodium (VOLTAREN) 1 % topical gel 4 g  4 g Topical QID Persons, Bevely Palmer, PA   4 g at 06/26/20 2248  . diphenhydrAMINE (BENADRYL) capsule 25 mg  25 mg Oral Q6H PRN Samuella Cota, MD   25 mg at 06/26/20 2239  . docusate sodium (COLACE) capsule 100 mg  100 mg Oral BID PRN Persons, Bevely Palmer, PA   100 mg at 06/23/20 2248  . docusate sodium (COLACE) capsule 100 mg  100 mg Oral BID Persons, Bevely Palmer, PA   100 mg at 06/26/20 2240  . febuxostat (ULORIC) tablet 40 mg  40 mg Oral Daily Persons, Bevely Palmer, [place]  40 mg at 06/26/20 0825  . gabapentin (NEURONTIN) capsule 100 mg  100 mg Oral QHS Persons, Bevely Palmer, PA   100 mg at 06/26/20 2240  . HYDROmorphone (DILAUDID) injection 0.5 mg  0.5 mg Intravenous Q4H PRN Persons, Bevely Palmer, PA   0.5 mg at 06/27/20 0029  . insulin aspart (novoLOG) injection 0-9 Units  0-9 Units Subcutaneous TID WC Persons, Bevely Palmer, [place]   2 Units at 06/25/20 1712  . insulin NPH Human (NOVOLIN N) injection 10 Units  10 Units Subcutaneous BID AC & HS Persons, Bevely Palmer, [place]   10 Units at 06/26/20 2245  . levalbuterol (XOPENEX) nebulizer solution 0.63 mg  0.63 mg Nebulization Q8H PRN Persons, Bevely Palmer, PA   0.63 mg at 06/15/20 0324  . multivitamin (RENA-VIT) tablet 1 tablet  1 tablet Oral  Daily Persons, Bevely Palmer, [place]   1 tablet at 06/26/20 [phone]  . Muscle Rub CREA   Topical PRN Persons, Bevely Palmer, PA      . ondansetron [facility]) injection 4 mg  4 mg Intravenous Q6H PRN Persons, Bevely Palmer, PA   4 mg at 06/24/20 0902  . oxyCODONE (Oxy IR/ROXICODONE) 5 MG immediate release tablet           . oxyCODONE (Oxy IR/ROXICODONE) immediate release tablet 5 mg  5 mg Oral Q4H PRN Persons, Bevely Palmer, PA   5 mg at 06/27/20 0913  . pantoprazole (PROTONIX) EC tablet 40 mg  40 mg Oral Daily Persons, Bevely Palmer, PA   40 mg at 06/26/20 0825  . polyvinyl alcohol (LIQUIFILM TEARS) 1.4 % ophthalmic solution 1 drop  1 drop Both Eyes PRN Persons, Bevely Palmer, PA      . rOPINIRole (REQUIP) tablet 4 mg  4 mg Oral BID Persons, Bevely Palmer, [place]   4 mg at 06/26/20 2243  . sodium chloride (OCEAN) 0.65 % nasal spray 1 spray  1 spray Each Nare PRN Persons, Bevely Palmer, [place]   1 spray at 06/23/20 2218  . Zinc Oxide (TRIPLE PASTE) 12.8 % ointment   Topical QID Persons, Bevely Palmer, [place]   Given at 06/26/20 1638      Review of Systems: 10 systems reviewed and negative except per interval history/subjective  Physical Exam: Vitals:   06/27/20 0915 06/27/20 0930  BP: (!) 73/43 123/64  Pulse: 84 72  Resp: 17 19  Temp:    SpO2: 100% 98%   No intake/output data recorded.  Intake/Output Summary (Last 24 hours) at 06/27/2020 0953 Last data filed at 06/26/2020 2300 Gross per 24 hour  Intake 360 ml  Output -  Net 360 ml   Constitutional: chronically ill appearing no distress CV: normal rate, trace edema Respiratory: Bilateral chest rise, no increased work of breathing Gastrointestinal: soft, non-tender, no palpable masses or hernias Skin: seborrhea on nose and cheeks, otherwise no visible lesions or rashes Psych: alert, judgement/insight appropriate, appropriate mood and affect   Test Results I personally reviewed new and old clinical labs and radiology tests Lab Results  Component Value Date   NA 135 06/27/2020   K  5.2 (H) 06/27/2020   CL 97 (L) 06/27/2020   CO2 28 06/27/2020   BUN 36 (H) 06/27/2020   CREATININE 6.69 (H) 06/27/2020   CALCIUM 9.2 06/27/2020   ALBUMIN 2.3 (L) 06/27/2020   PHOS 5.7 (H) 06/27/2020

## 2020-06-27 NOTE — Progress Notes (Signed)
Patient refused mobility due to pain unrelieved by PRN medication. Unable to assess sacrum. Unable to apply scheduled zinc cream.

## 2020-06-27 NOTE — [place] FL2 (Signed)
[place] MEDICAID FL2 LEVEL OF CARE SCREENING TOOL     IDENTIFICATION  Patient Name: Jean Mcclain Birthdate: [birth year] Sex: female Admission Date (Current Location): 06/14/2020  [facility] and [place] Number:  Herbalist and Address:  [facility], [place] [address], [place], [place] [address]      Provider Number: [id number]  Attending Physician Name and Address:  Samuella Cota, MD  Relative Name and Phone Number:  Ilona Sorrel, brother [phone]    Current Level of Care: Hospital Recommended Level of Care: [place] Prior Approval Number:    Date Approved/Denied:   PASRR Number: [id number]  Discharge Plan: SNF    Current Diagnoses: Patient Active Problem List   Diagnosis Date Noted  . Acute blood loss anemia 06/25/2020  . Acute on chronic respiratory failure with hypoxia and hypercapnia ([place]) 06/25/2020  . Advanced care planning/counseling discussion   . Goals of care, counseling/discussion   . Obstructive sleep apnea   . Primary osteoarthritis, left ankle and foot   . Ankle fracture 06/14/2020  . HLD (hyperlipidemia) 06/14/2020  . Dyspnea, unspecified 02/05/2020  . Intractable pain 01/24/2020  . Bilateral shoulder pain 01/23/2020  . Right knee pain 01/23/2020  . Uncontrolled type 2 diabetes mellitus with hyperglycemia, with long-term current use of insulin ([place]) 01/23/2020  . Pressure injury of skin 01/22/2020  . Mild protein-calorie malnutrition ([place]) 12/16/2019  . Personal history of anaphylaxis 12/05/2019  . Pruritus, unspecified 12/05/2019  . ESRD on hemodialysis ([place]) 11/20/2019  . Myoclonus, segmental 11/20/2019  . Allergy, unspecified, initial encounter 07/29/2019  . Anemia in chronic kidney disease 07/15/2019  . Coagulation defect, unspecified ([place]) 07/13/2019  . Iron deficiency anemia, unspecified 07/11/2019  . Encounter for immunization 07/06/2019  . End stage renal disease ([place]) 07/02/2019  .  Gastro-esophageal reflux disease without esophagitis 07/02/2019  . Low back pain 07/02/2019  . Morbid (severe) obesity due to excess calories ([employer]) 07/02/2019  . Other specified disorders of bone density and structure, unspecified site 07/02/2019  . Secondary hyperparathyroidism of renal origin ([place]) 07/02/2019  . Unspecified osteoarthritis, unspecified site 07/02/2019  . OSA (obstructive sleep apnea) 03/13/2019  . Chronic acquired lymphedema 11/05/2018  . Acute deep vein thrombosis (DVT) of popliteal vein of left lower extremity ([place]) 03/11/2017  . Insulin-requiring or dependent type II diabetes mellitus ([place]) 03/11/2017  . Essential hypertension 03/11/2017  . Impingement syndrome of left ankle 01/31/2017  . Posterior tibial tendinitis, left leg 01/31/2017  . Incarcerated ventral hernia 01/02/2013    Orientation RESPIRATION BLADDER Height & Weight     Self, Situation, Place   ([place] 3 Liters) Continent, External catheter Weight: 182 lb 15.7 oz (83 kg) Height:  5' (152.4 cm)  BEHAVIORAL SYMPTOMS/MOOD NEUROLOGICAL BOWEL NUTRITION STATUS      Continent Diet (See DC summary)  AMBULATORY STATUS COMMUNICATION OF NEEDS Skin   Extensive Assist Verbally PU Stage and Appropriate Care (Stage 2 on buttocks)                       Personal Care Assistance Level of Assistance  Bathing, Dressing Bathing Assistance: Maximum assistance   Dressing Assistance: Maximum assistance     Functional Limitations Info  Sight, Hearing, Speech Sight Info: Impaired Hearing Info: Adequate Speech Info: Adequate    SPECIAL CARE FACTORS FREQUENCY  PT (By licensed PT), OT (By licensed OT)     PT Frequency: 5x per week OT Frequency: 5x per week  Contractures Contractures Info: Not present    Additional Factors Info  Insulin Sliding Scale, Code Status, Allergies Code Status Info: Full Allergies Info: NKA   Insulin Sliding Scale Info: See DC summary       Current Medications  (06/27/2020):  This is the current hospital active medication list Current Facility-Administered Medications  Medication Dose Route Frequency Provider Last Rate Last Admin  . 0.9 %  sodium chloride infusion   Intravenous Continuous Persons, Bevely Palmer, [place]   Stopped at 06/25/20 1757  . acetaminophen (TYLENOL) tablet 650 mg  650 mg Oral Q6H PRN Persons, Bevely Palmer, [place]   650 mg at 06/21/20 2218   Or  . acetaminophen (TYLENOL) suppository 650 mg  650 mg Rectal Q6H PRN Persons, Bevely Palmer, PA      . apixaban Arne [place]) tablet 2.5 mg  2.5 mg Oral BID Samuella Cota, MD      . calcium acetate (PHOSLO) capsule 667 mg  667 mg Oral TID with meals Persons, Bevely Palmer, [place]   667 mg at 06/26/20 1643  . calcium acetate (PHOSLO) capsule 667 mg  667 mg Oral Daily PRN Persons, Bevely Palmer, PA      . carbidopa-levodopa (SINEMET IR) 10-100 MG per tablet immediate release 1 tablet  1 tablet Oral BID Persons, Bevely Palmer, [place]   1 tablet at 06/27/20 1255  . Chlorhexidine Gluconate Cloth 2 % PADS 6 each  6 each Topical Q0600 Persons, Bevely Palmer, [place]   6 each at 06/26/20 [phone]  . colchicine tablet 0.3 mg  0.3 mg Oral Daily Persons, Bevely Palmer, PA   0.3 mg at 06/26/20 0824  . [START ON 07/01/2020] Darbepoetin Alfa (ARANESP) injection 60 mcg  60 mcg Intravenous Q Wed-HD Reesa Chew, MD      . diclofenac Sodium (VOLTAREN) 1 % topical gel 4 g  4 g Topical QID Persons, Bevely Palmer, PA   4 g at 06/27/20 1301  . diphenhydrAMINE (BENADRYL) capsule 25 mg  25 mg Oral Q6H PRN Samuella Cota, MD   25 mg at 06/26/20 2239  . docusate sodium (COLACE) capsule 100 mg  100 mg Oral BID PRN Persons, Bevely Palmer, PA   100 mg at 06/23/20 2248  . docusate sodium (COLACE) capsule 100 mg  100 mg Oral BID Persons, Bevely Palmer, PA   100 mg at 06/27/20 1255  . febuxostat (ULORIC) tablet 40 mg  40 mg Oral Daily Persons, Bevely Palmer, PA   40 mg at 06/27/20 1307  . gabapentin (NEURONTIN) capsule 100 mg  100 mg Oral QHS Persons, Bevely Palmer, PA   100 mg at 06/26/20  2240  . HYDROmorphone (DILAUDID) injection 0.5 mg  0.5 mg Intravenous Q4H PRN Persons, Bevely Palmer, PA   0.5 mg at 06/27/20 0029  . insulin aspart (novoLOG) injection 0-9 Units  0-9 Units Subcutaneous TID WC Persons, Bevely Palmer, [place]   2 Units at 06/25/20 1712  . insulin NPH Human (NOVOLIN N) injection 10 Units  10 Units Subcutaneous BID AC & HS Persons, Bevely Palmer, [place]   10 Units at 06/26/20 2245  . levalbuterol (XOPENEX) nebulizer solution 0.63 mg  0.63 mg Nebulization Q8H PRN Persons, Bevely Palmer, PA   0.63 mg at 06/15/20 0324  . multivitamin (RENA-VIT) tablet 1 tablet  1 tablet Oral Daily Persons, Bevely Palmer, [place]   1 tablet at 06/27/20 1255  . Muscle Rub CREA   Topical PRN Persons, Bevely Palmer, PA      . ondansetron [facility])  injection 4 mg  4 mg Intravenous Q6H PRN Persons, Bevely Palmer, PA   4 mg at 06/24/20 0902  . oxyCODONE (Oxy IR/ROXICODONE) immediate release tablet 5 mg  5 mg Oral Q4H PRN Persons, Bevely Palmer, PA   5 mg at 06/27/20 0913  . pantoprazole (PROTONIX) EC tablet 40 mg  40 mg Oral Daily Persons, Bevely Palmer, PA   40 mg at 06/27/20 1255  . polyvinyl alcohol (LIQUIFILM TEARS) 1.4 % ophthalmic solution 1 drop  1 drop Both Eyes PRN Persons, Bevely Palmer, PA      . rOPINIRole (REQUIP) tablet 4 mg  4 mg Oral BID Persons, Bevely Palmer, PA   4 mg at 06/27/20 1256  . sodium chloride (OCEAN) 0.65 % nasal spray 1 spray  1 spray Each Nare PRN Persons, Bevely Palmer, [place]   1 spray at 06/23/20 2218  . Zinc Oxide (TRIPLE PASTE) 12.8 % ointment   Topical QID Persons, Bevely Palmer, [place]   Given at 06/27/20 1308     Discharge Medications: Please see discharge summary for a list of discharge medications.  Relevant Imaging Results:  Relevant Lab Results:   Additional Information Pt receives dialysis at [facility] (M/W/F 10:30am arrival; SSN :[id number]  Loreta [address], LCSWA

## 2020-06-27 NOTE — Progress Notes (Signed)
PROGRESS NOTE  Jean Mcclain WUX:[id number] DOB: 1945/12/08 DOA: 06/14/2020 PCP: Leanna Battles, MD  Brief History   74 year old woman complex past medical history presented with left ankle and foot pain, found to have fracture requiring surgery.  Surgery was delayed secondary to acute on chronic hypoxic and hypercapnic respiratory failure requiring BiPAP and pulmonology consultation. Condition improved w/ BiPAP and pt underwent surgery 2/53 w/o complication. Management has been per orthopedics, now ready for SNF. Will need BiPAP nightly.   A & P  Chronic ankle and subtalar arthritis with acute medial malleolar fracture. --Patient may be weightbearing for transfers,no gait training. Weightbearing with the Cam boot walker on,she does not need the boot while in bed. --Praveena pump, keep dressing in place, follow-up with Dr. Sharol Given in 1 week.  Acute on chronic hypoxic and hypercapnic respiratory failure, OHS --remainsstable. Continue BiPAP at night and supplemental oxygen during day --high risk for recurrent episodes of hypercarbic respiratory failure. --Continue BiPAP at nighttime. --Follow-up in sleep clinic as soon as possible upon discharge. Dr. Valeta Harms will help arrange this in office. 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  Diabetes mellitus type 2 on insulin --remains stable. Continue sliding scale insulin and NPH  ESRD MWF --s/p HD today  Disposition Plan:  Discussion: SNF today, confirmed ready per Dr. Sharol Given   DVT prophylaxis:  apixaban Arne [place]) tablet 2.5 mg Start: 06/27/20 1400 SCDs Start: 06/24/20 1227 apixaban (ELIQUIS) tablet 2.5 mg  Code Status: DNR Family Communication:   Murray Hodgkins, MD  [facility] Direct contact: see [email or web address] (further directions at bottom of note if needed) 7PM-7AM contact night coverage as at bottom of note 06/27/2020, 2:22 PM  LOS: 13 days   Interval History/Subjective  CC: f/u  ankle fx  C/o left ankle pain, otherwise ok   Objective   Vitals:  Vitals:   06/27/20 1200 06/27/20 1300  BP: (!) 98/42 (!) 98/35  Pulse: 89 89  Resp: (!) 22 15  Temp:    SpO2: 96% 96%    Exam:  Physical Exam Vitals reviewed.  Cardiovascular:     Rate and Rhythm: Normal rate and regular rhythm.     Heart sounds: Normal heart sounds.  Pulmonary:     Effort: Pulmonary effort is normal.     Breath sounds: Normal breath sounds.  Musculoskeletal:     Right lower leg: No edema.     Comments: Left leg bandaged  Neurological:     Mental Status: She is alert. Mental status is at baseline.  Psychiatric:        Mood and Affect: Mood normal.        Behavior: Behavior normal.      I have personally reviewed the following:   Today's Data  . BMP and CBC noted . Hgb stable at 8.6  Scheduled Meds: . apixaban  2.5 mg Oral BID  . calcium acetate  667 mg Oral TID with meals  . carbidopa-levodopa  1 tablet Oral BID  . Chlorhexidine Gluconate Cloth  6 each Topical Q0600  . colchicine  0.3 mg Oral Daily  . [START ON 07/01/2020] darbepoetin (ARANESP) injection - DIALYSIS  60 mcg Intravenous Q Wed-HD  . diclofenac Sodium  4 g Topical QID  . docusate sodium  100 mg Oral BID  . febuxostat  40 mg Oral Daily  . gabapentin  100 mg Oral QHS  . insulin aspart  0-9 Units Subcutaneous TID WC  . insulin NPH Human  10 Units Subcutaneous BID  AC & HS  . multivitamin  1 tablet Oral Daily  . pantoprazole  40 mg Oral Daily  . rOPINIRole  4 mg Oral BID  . Zinc Oxide   Topical QID   Continuous Infusions: . sodium chloride Stopped (06/25/20 1757)    Principal Problem:   Ankle fracture Active Problems:   Acute on chronic respiratory failure with hypoxia and hypercapnia (HCC)   Acute blood loss anemia   Insulin-requiring or dependent type II diabetes mellitus (HCC)   Essential hypertension   Pressure injury of skin   End stage renal disease (HCC)   HLD (hyperlipidemia)   Primary  osteoarthritis, left ankle and foot   Advanced care planning/counseling discussion   Goals of care, counseling/discussion   Obstructive sleep apnea   LOS: 13 days   How to contact the [facility] Attending or Consulting provider 7A - 7P or covering provider during after hours [place], for this patient?  1. Check the care team in [facility] and look for a) attending/consulting TRH provider listed and b) the [facility] team listed 2. Log into [email or web address] and use [place]'s universal password to access. If you do not have the password, please contact the hospital operator. 3. Locate the [facility] provider you are looking for under [facility] and page to a number that you can be directly reached. 4. If you still have difficulty reaching the provider, please page the [facility] (Director on Call) for the Hospitalists listed on amion for assistance.

## 2020-06-29 ENCOUNTER — Other Ambulatory Visit: Payer: Self-pay

## 2020-06-29 DIAGNOSIS — Z992 Dependence on renal dialysis: Secondary | ICD-10-CM | POA: Diagnosis not present

## 2020-06-29 DIAGNOSIS — S82892A Other fracture of left lower leg, initial encounter for closed fracture: Secondary | ICD-10-CM | POA: Diagnosis not present

## 2020-06-29 DIAGNOSIS — E785 Hyperlipidemia, unspecified: Secondary | ICD-10-CM | POA: Diagnosis not present

## 2020-06-29 DIAGNOSIS — E1169 Type 2 diabetes mellitus with other specified complication: Secondary | ICD-10-CM | POA: Diagnosis not present

## 2020-06-29 DIAGNOSIS — Z86718 Personal history of other venous thrombosis and embolism: Secondary | ICD-10-CM | POA: Diagnosis not present

## 2020-06-29 DIAGNOSIS — N186 End stage renal disease: Secondary | ICD-10-CM | POA: Diagnosis not present

## 2020-06-29 NOTE — Patient Outreach (Signed)
  [facility]) Care Management Chronic Special Needs Program    06/29/2020  Name: Jean Mcclain, DOB: [birth year]  MRN: [id number]   Ms. Jean Mcclain is enrolled in a chronic special needs plan.   Per chart: Client admitted 06/14/20 with Left ankle fracture and acute on chronic respiratory failure. Surgery to ankle done on 06/24/20. Client also with Pressure ulcer stage 2 right buttocks. Attends hemodialysis three times a week. Client discharged to skilled facility, [facility], on 06/27/20.   Client is active with [facility] for complex community care management who will be following post discharge from skilled facility.   Plan: RNCM will continue to follow. Continue care coordination with [facility] as indicated. RNCM will update assigned care coordinator, [place] upon her return.  Covering RNCM: Thea Silversmith, RN, MSN, [place] [facility] [phone]

## 2020-07-02 ENCOUNTER — Ambulatory Visit: Payer: HMO | Admitting: Sports Medicine

## 2020-07-03 ENCOUNTER — Emergency Department (HOSPITAL_COMMUNITY): Payer: HMO

## 2020-07-03 ENCOUNTER — Emergency Department (HOSPITAL_COMMUNITY)
Admission: EM | Admit: 2020-07-03 | Discharge: 2020-07-03 | Disposition: A | Discharge status: Home / Self Care | Payer: HMO | Attending: Emergency Medicine | Admitting: Emergency Medicine

## 2020-07-03 ENCOUNTER — Other Ambulatory Visit: Payer: Self-pay

## 2020-07-03 ENCOUNTER — Encounter (HOSPITAL_COMMUNITY): Payer: Self-pay | Admitting: *Deleted

## 2020-07-03 DIAGNOSIS — W19XXXA Unspecified fall, initial encounter: Secondary | ICD-10-CM | POA: Insufficient documentation

## 2020-07-03 DIAGNOSIS — S322XXA Fracture of coccyx, initial encounter for closed fracture: Secondary | ICD-10-CM

## 2020-07-03 DIAGNOSIS — M542 Cervicalgia: Secondary | ICD-10-CM | POA: Diagnosis not present

## 2020-07-03 DIAGNOSIS — G319 Degenerative disease of nervous system, unspecified: Secondary | ICD-10-CM | POA: Diagnosis not present

## 2020-07-03 DIAGNOSIS — M79672 Pain in left foot: Secondary | ICD-10-CM | POA: Insufficient documentation

## 2020-07-03 DIAGNOSIS — M47816 Spondylosis without myelopathy or radiculopathy, lumbar region: Secondary | ICD-10-CM | POA: Diagnosis not present

## 2020-07-03 DIAGNOSIS — M160 Bilateral primary osteoarthritis of hip: Secondary | ICD-10-CM | POA: Diagnosis not present

## 2020-07-03 DIAGNOSIS — G8918 Other acute postprocedural pain: Secondary | ICD-10-CM | POA: Diagnosis not present

## 2020-07-03 DIAGNOSIS — I10 Essential (primary) hypertension: Secondary | ICD-10-CM | POA: Insufficient documentation

## 2020-07-03 DIAGNOSIS — S199XXA Unspecified injury of neck, initial encounter: Secondary | ICD-10-CM | POA: Diagnosis not present

## 2020-07-03 DIAGNOSIS — S3210XA Unspecified fracture of sacrum, initial encounter for closed fracture: Secondary | ICD-10-CM | POA: Insufficient documentation

## 2020-07-03 DIAGNOSIS — S0990XA Unspecified injury of head, initial encounter: Secondary | ICD-10-CM | POA: Diagnosis not present

## 2020-07-03 DIAGNOSIS — M533 Sacrococcygeal disorders, not elsewhere classified: Secondary | ICD-10-CM | POA: Diagnosis not present

## 2020-07-03 DIAGNOSIS — I739 Peripheral vascular disease, unspecified: Secondary | ICD-10-CM | POA: Diagnosis not present

## 2020-07-03 HISTORY — DX: Disorder of kidney and ureter, unspecified: N28.9

## 2020-07-03 HISTORY — DX: Acute and chronic respiratory failure, unspecified whether with hypoxia or hypercapnia: J96.20

## 2020-07-03 HISTORY — DX: Personal history of other venous thrombosis and embolism: Z86.718

## 2020-07-03 HISTORY — DX: Morbid (severe) obesity due to excess calories: E66.01

## 2020-07-03 LAB — BASIC METABOLIC PANEL
Anion gap: 12 (ref 5–15)
BUN: 22 mg/dL (ref 8–23)
CO2: 28 mmol/L (ref 22–32)
Calcium: 8.9 mg/dL (ref 8.9–10.3)
Chloride: 97 mmol/L — ABNORMAL LOW (ref 98–111)
Creatinine, Ser: 5.16 mg/dL — ABNORMAL HIGH (ref 0.44–1.00)
GFR calc Af Amer: 9 mL/min — ABNORMAL LOW (ref >60)
GFR calc non Af Amer: 8 mL/min — ABNORMAL LOW (ref >60)
Glucose, Bld: 164 mg/dL — ABNORMAL HIGH (ref 70–99)
Potassium: 4.3 mmol/L (ref 3.5–5.1)
Sodium: 137 mmol/L (ref 135–145)

## 2020-07-03 MED ORDER — HYDROCODONE-ACETAMINOPHEN 5-325 MG PO TABS
1.0000 | ORAL_TABLET | Freq: Once | ORAL | Status: AC
  Administered 2020-07-03: 1 via ORAL
  Filled 2020-07-03: qty 1

## 2020-07-03 NOTE — Progress Notes (Signed)
CSW met with Pt at bedside. Pt states that [facility] dropped her while in [place]. Pt requests transfer to another SNF.   SNF transfers cannot be done from ED and Pt will have to request transfer when she returns to facility. CSW did supply Pt with Ombudsman's name and number for follow-up. This information was added to AVS as well.  CSW provided Pt with house phone so that Pt could call her brother.

## 2020-07-03 NOTE — ED Provider Notes (Signed)
[facility] EMERGENCY DEPARTMENT Provider Note   CSN: [id number] Arrival date & time: 07/03/20  1038     History No chief complaint on file.   Jean Mcclain is a 74 y.o. female.  Patient is an 74 y./o elderly female with multiple medical problems who is currently staying at a rehab facility after an ankle surgery.  Patient is wheelchair-bound at this time and was being moved today with a Hoyer lift when she reports she was dropped approximately 3 feet to the floor.  She remembers the incident but did fall back and hit her head.  Patient does take anticoagulation.  She is reporting neck pain and significant tailbone pain which is the worst.  She is also having pain in her left foot which she reports is typical since her surgery.  Patient was supposed to be going to dialysis today which is her normal day for dialysis.  She denies any significant chest pain, shortness of breath or abdominal pain.  She has been getting her dialysis regularly.  She has had no pain medication that she would normally take for her foot today.   The history is provided by the patient and the EMS personnel.       Past Medical History:  Diagnosis Date  . Acute and chronic respiratory failure (acute-on-chronic) ([place])   . Arthritis    osteo  . H/O blood clots   . Hyperlipemia   . Hypertension   . Morbid obesity ([place])   . Renal disorder    MWF dialysis    There are no problems to display for this patient.   History reviewed. No pertinent surgical history.   OB History   No obstetric history on file.     No family history on file.  Social History   Tobacco Use  . Smoking status: Not on file  Substance Use Topics  . Alcohol use: Not on file  . Drug use: Not on file    Home Medications Prior to Admission medications   Not on File    Allergies    Patient has no known allergies.  Review of Systems   Review of Systems  All other systems reviewed and are  negative.   Physical Exam Updated Vital Signs BP 120/70   Temp (!) 97.1 F (36.2 C) (Temporal)   Resp (!) 22   Ht 5' (1.524 m)   Wt 87.5 kg   SpO2 100%   BMI 37.69 kg/m   Physical Exam Vitals and nursing note reviewed.  Constitutional:      General: She is not in acute distress.    Appearance: Normal appearance. She is well-developed. She is obese.     Comments: Appears uncomfortable  HENT:     Head: Normocephalic and atraumatic.  Eyes:     Pupils: Pupils are equal, round, and reactive to light.  Cardiovascular:     Rate and Rhythm: Normal rate and regular rhythm.     Heart sounds: Normal heart sounds. No murmur heard.  No friction rub.  Pulmonary:     Effort: Pulmonary effort is normal.     Breath sounds: Normal breath sounds. No wheezing or rales.  Abdominal:     General: Bowel sounds are normal. There is no distension.     Palpations: Abdomen is soft.     Tenderness: There is no abdominal tenderness. There is no guarding or rebound.  Musculoskeletal:        General: Tenderness present. Normal range of motion.  Cervical back: Tenderness and bony tenderness present. No swelling or deformity. Normal range of motion.     Thoracic back: Normal.     Lumbar back: No tenderness or bony tenderness. Normal range of motion.       Legs:     Comments: No edema.  Right ankle in a camwalker boot with drainage tube in place.  Right foot without acute findings.  Hips neg  Skin:    General: Skin is warm and dry.     Findings: No rash.  Neurological:     General: No focal deficit present.     Mental Status: She is alert and oriented to person, place, and time. Mental status is at baseline.     Cranial Nerves: No cranial nerve deficit.  Psychiatric:        Mood and Affect: Mood normal.        Behavior: Behavior normal.        Thought Content: Thought content normal.     ED Results / Procedures / Treatments   Labs (all labs ordered are listed, but only abnormal results  are displayed) Labs Reviewed  BASIC METABOLIC PANEL - Abnormal; Notable for the following components:      Result Value   Chloride 97 (*)    Glucose, Bld 164 (*)    Creatinine, Ser 5.16 (*)    GFR calc non Af Amer 8 (*)    GFR calc Af Amer 9 (*)    All other components within normal limits    EKG None  Radiology DG Sacrum/Coccyx  Result Date: 07/03/2020 CLINICAL DATA:  Fall, coccyx pain. EXAM: SACRUM AND COCCYX - 2+ VIEW COMPARISON:  None. FINDINGS: Angulation noted within the lower sacrum/upper coccygeal region compatible with fracture. No additional acute bony abnormality. Degenerative changes in the lower lumbar spine and hips, right greater than left. IMPRESSION: Angulation noted in the region of the lower sacrum/upper coccyx on the lateral view compatible with fracture. Electronically Signed   By: Rolm Baptise M.D.   On: 07/03/2020 11:32   CT HEAD WO CONTRAST  Result Date: 07/03/2020 CLINICAL DATA:  Head trauma EXAM: CT HEAD WITHOUT CONTRAST TECHNIQUE: Contiguous axial images were obtained from the base of the skull through the vertex without intravenous contrast. COMPARISON:  03/31/2017 FINDINGS: Brain: There is atrophy and chronic small vessel disease changes. No acute intracranial abnormality. Specifically, no hemorrhage, hydrocephalus, mass lesion, acute infarction, or significant intracranial injury. Vascular: No hyperdense vessel or unexpected calcification. Skull: No acute calvarial abnormality. Sinuses/Orbits: Visualized paranasal sinuses and mastoids clear. Orbital soft tissues unremarkable. Other: None IMPRESSION: Atrophy, chronic microvascular disease. No acute intracranial abnormality. Electronically Signed   By: Rolm Baptise M.D.   On: 07/03/2020 11:49   CT Cervical Spine Wo Contrast  Result Date: 07/03/2020 CLINICAL DATA:  Neck trauma EXAM: CT CERVICAL SPINE WITHOUT CONTRAST TECHNIQUE: Multidetector CT imaging of the cervical spine was performed without intravenous  contrast. Multiplanar CT image reconstructions were also generated. COMPARISON:  None. FINDINGS: Alignment: Normal Skull base and vertebrae: No acute fracture. No primary bone lesion or focal pathologic process. Soft tissues and spinal canal: No prevertebral fluid or swelling. No visible canal hematoma. Disc levels:  Diffuse degenerative disc disease and facet disease. Upper chest: No acute findings Other: None IMPRESSION: Diffuse degenerative disc and facet disease. No acute bony abnormality. Electronically Signed   By: Rolm Baptise M.D.   On: 07/03/2020 11:51    Procedures Procedures (including critical care time)  Medications Ordered in  ED Medications  HYDROcodone-acetaminophen (NORCO/VICODIN) 5-325 MG per tablet 1 tablet (has no administration in time range)    ED Course  I have reviewed the triage vital signs and the nursing notes.  Pertinent labs & imaging results that were available during my care of the patient were reviewed by me and considered in my medical decision making (see chart for details).    MDM Rules/Calculators/A&P                          Elderly female presenting as a level two trauma due to being on anticoagulation and when they were moving her with a Hoyer lift today at her skilled facility they dropped her approximately 3 feet where she landed on her buttocks and then hit her head.  There was no loss of consciousness.  Patient does complain of some neck pain but has no headache or change in mental status.  Her most significant pain is located over her coccyx with some associated bruising.  CT of the head, cervical spine and coccyx films are pending.  Patient appears to have no other evidence of injury.  She was supposed to go to dialysis today and if images are negative we will try to coordinate her going to dialysis from the emergency room.    12:21 PM CT of the head and neck is negative for acute injury.  Sacral images show angulation noted in the lower region of  the sacrum and upper coccyx compatible with a fracture which is where patient is experiencing discomfort.  Patient was given a donut cushion here and pain control.  Discussed with the renal coordinator and there evaluating whether she can get in for dialysis tomorrow.  Will check a BMP to ensure her potassium is okay.  She is in no respiratory distress and does not appear significantly fluid overloaded.    3:36 PM BMP with a potassium of 4.3.  Patient can dialyze tomorrow there is appointment set for 1150 and she needs to be there at 1130.  Renal coordinator Jaclyn Shaggy has already talked with patient skilled facility and they will ensure she is transported there by 1130.  Patient is very unhappy at the facility she is currently at and now is very upset because they have dropped her.  Did discuss with case management however patient has to request to transfer when she returns to the facility.  We will give her a prescription for pain medication that she can take as needed for the fracture.  MDM Number of Diagnoses or Management Options   Amount and/or Complexity of Data Reviewed Clinical lab tests: ordered and reviewed Tests in the radiology section of CPT: ordered and reviewed Decide to obtain previous medical records or to obtain history from someone other than the patient: yes Review and summarize past medical records: yes Independent visualization of images, tracings, or specimens: yes  Risk of Complications, Morbidity, and/or Mortality Presenting problems: moderate Diagnostic procedures: low Management options: low  Patient Progress Patient progress: stable     Final Clinical Impression(s) / ED Diagnoses Final diagnoses:  Sacrum and coccyx fracture, closed, initial encounter [facility])    Rx / DC Orders ED Discharge Orders    None       Blanchie Dessert, MD 07/03/20 1539

## 2020-07-03 NOTE — Progress Notes (Signed)
Renal Navigator received call from EDP/W. Maryan Rued requesting for patient to be rescheduled at OP HD clinic today since she missed her appt due to ED evaluation today. Unfortunately, it is too late in the day for her to get treatment at her clinic today. Navigator requests potassium to be drawn and evaluation completed to see if patient is safe to wait for HD until tomorrow. Navigator contacted OP HD clinic/[facility] and has tentatively rescheduled patient for Saturday, 07/04/20 at 11:50am. She will need to arrive at 11:30am for this appointment.  Navigator contacted [facility] and they have agreed to transport to this appointment.  Navigator awaits confirmation from EDP after lab results are in.   Alphonzo Cruise, [place] Renal Navigator [phone]

## 2020-07-03 NOTE — ED Notes (Signed)
Called ortho and PT to see if they have a donut pillow. Await callback from PT.

## 2020-07-03 NOTE — ED Notes (Signed)
Pain meds requested

## 2020-07-03 NOTE — Discharge Instructions (Addendum)
Call [facility] @ [phone] to follow up with report about incident.  The x-rays today showed that you broke your tailbone in the fall.  No other signs of injury.  Take the pain medication that you are already prescribed as needed and use the donut cushion to sit on.

## 2020-07-03 NOTE — Progress Notes (Signed)
   07/03/20 1548  TOC ED Mini Assessment  TOC Time spent with patient (minutes): 45  PING Used in TOC Assessment No  Admission or Readmission Diverted Yes  Interventions which prevented an admission or readmission Patient counseling  What brought you to the Emergency Department?  Fall from [employer] lift  Barriers to Discharge No Barriers Identified  Means of departure Ambulance  Key Contact [address] [place] with Receptionist  Contact Date 07/03/20  Contact time [place] Phone Number [phone]  Call outcome CSW discover the correct contact for Pt to make complaint against facility and passed contact name and number to Pt  Patient states their goals for this hospitalization and ongoing recovery are: Pt wishes to transfer SNFs, but that has to be done from SNF not from emergency deparrtment

## 2020-07-03 NOTE — ED Notes (Signed)
Donut ordered from spd.

## 2020-07-03 NOTE — Progress Notes (Signed)
Renal Navigator received confirmation from EDP that patient is cleared for discharge today and HD treatment tomorrow. Navigator notified OP HD clinic that patient will be there for treatment at reschedule appointment tomorrow.  Alphonzo Cruise, [place] Renal Navigator [phone]

## 2020-07-03 NOTE — ED Notes (Signed)
Called [facility] for transport.  

## 2020-07-03 NOTE — Progress Notes (Signed)
Orthopedic Tech Progress Note Patient Details:  Jean Mcclain [birth year] [id number] Stand By for level two trauma Patient ID: Jean Mcclain, female   DOB: Sep 29, 1946, 74 y.o.   MRN: [id number]   Jean Mcclain 07/03/2020, 10:51 AM

## 2020-07-03 NOTE — ED Notes (Signed)
PT to CT.

## 2020-07-06 ENCOUNTER — Encounter (HOSPITAL_COMMUNITY): Payer: Self-pay | Admitting: Orthopedic Surgery

## 2020-07-07 ENCOUNTER — Other Ambulatory Visit: Payer: Self-pay

## 2020-07-07 ENCOUNTER — Ambulatory Visit: Payer: Self-pay

## 2020-07-07 ENCOUNTER — Ambulatory Visit (INDEPENDENT_AMBULATORY_CARE_PROVIDER_SITE_OTHER): Payer: HMO | Admitting: Pulmonary Disease

## 2020-07-07 ENCOUNTER — Ambulatory Visit (INDEPENDENT_AMBULATORY_CARE_PROVIDER_SITE_OTHER): Payer: HMO | Admitting: Family

## 2020-07-07 ENCOUNTER — Encounter: Payer: Self-pay | Admitting: Pulmonary Disease

## 2020-07-07 ENCOUNTER — Encounter: Payer: Self-pay | Admitting: Family

## 2020-07-07 VITALS — BP 130/70 | HR 85 | Temp 97.3°F | Ht 63.0 in | Wt 193.0 lb

## 2020-07-07 VITALS — Ht 60.0 in | Wt 193.0 lb

## 2020-07-07 DIAGNOSIS — Z6834 Body mass index (BMI) 34.0-34.9, adult: Secondary | ICD-10-CM | POA: Diagnosis not present

## 2020-07-07 DIAGNOSIS — M25572 Pain in left ankle and joints of left foot: Secondary | ICD-10-CM | POA: Diagnosis not present

## 2020-07-07 DIAGNOSIS — J9621 Acute and chronic respiratory failure with hypoxia: Secondary | ICD-10-CM

## 2020-07-07 DIAGNOSIS — G4733 Obstructive sleep apnea (adult) (pediatric): Secondary | ICD-10-CM | POA: Diagnosis not present

## 2020-07-07 DIAGNOSIS — J9622 Acute and chronic respiratory failure with hypercapnia: Secondary | ICD-10-CM | POA: Diagnosis not present

## 2020-07-07 NOTE — Patient Instructions (Addendum)
You were seen today by Lauraine Rinne, NP  for:   1. OSA (obstructive sleep apnea) 2. Acute on chronic respiratory failure with hypoxia and hypercapnia (HCC)  I will discuss her case with Dr. Jenetta Downer. You likely will require a in lab sleep study or you may be able to to start you on a noninvasive ventilator given the fact that you are at high risk of retaining CO2 levels given your morbid obesity and severe obstructive sleep apnea  Continue oxygen therapy as prescribed  >>>maintain oxygen saturations greater than 88 percent  >>>if unable to maintain oxygen saturations please contact the office  >>>do not smoke with oxygen  >>>can use nasal saline gel or nasal saline rinses to moisturize nose if oxygen causes dryness  3. Morbid (severe) obesity due to excess calories ([place])  Continue to work with primary care on management of your weight   Follow Up:    Return in about 4 weeks (around 08/04/2020), or if symptoms worsen or fail to improve, for Follow up with Dr. Ander Slade.   Notification of test results are managed in the following manner: If there are  any recommendations or changes to the  plan of care discussed in office today,  we will contact you and let you know what they are. If you do not hear from [place], then your results are normal and you can view them through your  MyChart account , or a letter will be sent to you. Thank you again for trusting [place] with your care  - Thank you, [facility]    It is flu season:   >>> Best ways to protect herself from the flu: Receive the yearly flu vaccine, practice good hand hygiene washing with soap and also using hand sanitizer when available, eat a nutritious meals, get adequate rest, hydrate appropriately       Please contact the office if your symptoms worsen or you have concerns that you are not improving.   Thank you for choosing [facility] for your healthcare, and for allowing [place] to partner with you on your healthcare journey.  I am thankful to be able to provide care to you today.   Wyn Quaker FNP-C

## 2020-07-07 NOTE — Progress Notes (Signed)
'@Patient'  ID: Jean Mcclain, female    DOB: 1946/04/25, 74 y.o.   MRN: [id number]  Chief Complaint  Patient presents with  . Hospitalization Follow-up    OSA, SOB    Referring provider: Leanna Battles, MD  HPI:   PMH:  Smoker/ Smoking History:  Maintenance:   Pt of: Dr. Jenetta Downer  07/07/2020  - Visit   74 year old female former smoker completing a hospital follow-up with our office today.  Patient was last seen in our office in June/2020 by [place] NP.  That was a virtual visit.  At that point in time it was recommended that she start CPAP therapy which patient refused.  She is encouraged to remain on oxygen at night.  She was encouraged to follow-up with [place] surgery for weight management.  She was encouraged to have follow-up with our office to establish care with Dr. Jenetta Downer.  Patient completing hospital follow-up with our office today after being hospitalized on 06/14/2020 for acute on chronic hypercarbic respiratory failure.  She was discharged on 06/27/2020.  The discharge summary is listed below:  Admit date: 06/14/2020 Discharge date: 06/27/2020  Recommendations for Outpatient Follow-up:  Chronic ankle and subtalar arthritis with acute medial malleolar fracture. --Status post surgery 9/15 per orthopedics. --continue managementper orthopedics including  --Patient may be weightbearing for transfers,no gait training. Weightbearing with the Cam boot walker on,she does not need the boot while in bed. --Praveena pump, keep dressing in place, follow-up with Dr. Sharol Given in 1 week. -Incidental finding of chronic bilateral shoulder subluxation -- followup with orthopedics as an outpatient.  Acute on chronic hypoxic and hypercapnic respiratory failure, OHS --remainsstable. Continue BiPAP at night and supplemental oxygen during day --high risk for recurrent episodes of hypercarbic respiratory failure. --Continue BiPAP at nighttime. --Follow-up in sleep clinic as soon as possible upon  discharge. Dr. Valeta Harms will help arrange this in office. 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          Contact information for follow-up providers        Persons, Bevely Palmer, [place] In 1 week.   Specialty: Orthopedic Surgery Contact information: [place] [address] [phone]             Garner Nash, DO. Schedule an appointment as soon as possible for a visit in 2 week(s).   Specialty: Pulmonary Disease Why: call office for sleep study appointment and followup chronic respiratory failure Contact information: [address] [place] [address] [phone]                 Contact information for after-discharge care            Destination        [facility] .   Service: Skilled Nursing Contact information: [place] [phone]                     Discharge Diagnoses: Principal diagnosis is #1 Principal Problem:   Ankle fracture Active Problems:   Acute on chronic respiratory failure with hypoxia and hypercapnia (HCC)   Acute blood loss anemia   Insulin-requiring or dependent type II diabetes mellitus (HCC)   Essential hypertension   Pressure injury of skin   End stage renal disease (HCC)   HLD (hyperlipidemia)   Primary osteoarthritis, left ankle and foot   Advanced care planning/counseling discussion   Goals of care, counseling/discussion   Obstructive sleep  apnea  Discharge Condition: improved Disposition: short-term rehab  Diet recommendation: heart healthy diabetic diet       Filed Weights   06/25/20 0530 06/25/20 0722 06/27/20 0626  Weight: 91 kg 92 kg 83 kg    History of present illness/Hospital Course:  74 year old woman complex past medical history presented with left ankle and foot pain, found to have fracture requiring surgery.  Surgery was delayed secondary to  acute on chronic hypoxic and hypercapnic respiratory failure requiring BiPAP and pulmonology consultation. Condition improved w/ BiPAP and pt underwent surgery 7/56 w/o complication. Management has been per orthopedics, now ready for SNF. Will need BiPAP nightly.  Discussed with Dr. Sharol Given 9/18, cleared for discharge  Chronic ankle and subtalar arthritis with acute medial malleolar fracture. --Status post surgery 9/15 per orthopedics. --continue managementper orthopedics including  --Patient may be weightbearing for transfers,no gait training. Weightbearing with the Cam boot walker on,she does not need the boot while in bed. --Praveena pump, keep dressing in place, follow-up with Dr. Sharol Given in 1 week.  Acute on chronic hypoxic and hypercapnic respiratory failure, OHS --remainsstable. Continue BiPAP at night and supplemental oxygen during day --high risk for recurrent episodes of hypercarbic respiratory failure. --Continue BiPAP at nighttime. --Follow-up in sleep clinic as soon as possible upon discharge. Dr. Valeta Harms will help arrange this in office. 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  Diabetes mellitus type 2 on insulin --remains stable. Continue sliding scale insulin and NPH  ESRD MWF --management per nephrology  PMH DVT, on apixaban  OSA not on CPAP, chronic hypoxic resp failure on home oxygen at night --management as above  Obesity  --f/u as oupt Myoclonic jerks, RLS --followed by neurology, continue Sinemet, Requip  Macrocytic anemia -stable, -f/u as an outpt  DNR per PMT  Pressure ulcer- POA Pressure Injury 06/14/20 Buttocks Right;Posterior Stage 2 - Partial thickness loss of dermis presenting as a shallow open injury with a red, pink wound bed without slough. (Active)  06/14/20 2200  Location: Buttocks  Location Orientation: Right;Posterior  Staging: Stage 2 - Partial thickness loss of dermis presenting as a  shallow open injury with a red, pink wound bed without slough.  Wound Description (Comments):   Present on Admission: Yes  -WOC consult:Using no rinse spray, cleanse buttocks and bilateral posterior upper thighs. Apply triple paste.I have also entered an order for mattress with low air loss feature.   Patient was also seen in the emergency room on 07/03/2020 for sacrum and coccyx fracture from her rehab facility.  Patient reporting today she was dropped on the [facility] lift.  She is yet to follow-up with primary care regarding this.  She plans to.  She is concerned about the rehab facility she is currently established with.  She is currently right now not using a BiPAP.  Or noninvasive ventilator.  She is reports that she was previously trialed on CPAP therapy but struggled to find an appropriate mask fit.  She did not yet follow-up with our office regarding this.  We will discuss this today.    Questionaires / Pulmonary Flowsheets:   Epworth:  Results of the Epworth flowsheet 09/12/2018  Sitting and reading 3  Watching TV 3  Sitting, inactive in a public place (e.g. a theatre or a meeting) 3  As a passenger in a car for an hour without a break 3  Lying down to rest in the afternoon when circumstances permit 2  Sitting and talking to someone 3  Sitting  quietly after a lunch without alcohol 3  In a car, while stopped for a few minutes in traffic 3  Total score 23    Tests:   10/29/2018-split-night sleep study-AHI 66.2  06/17/2020-ABG-pH 7.23, PCO2 80.8, PO2 53.9, bicarb 32.9  FENO:  No results found for: NITRICOXIDE  PFT: No flowsheet data found.  WALK:  No flowsheet data found.  Imaging: DG Sacrum/Coccyx  Result Date: 07/03/2020 CLINICAL DATA:  Fall, coccyx pain. EXAM: SACRUM AND COCCYX - 2+ VIEW COMPARISON:  None. FINDINGS: Angulation noted within the lower sacrum/upper coccygeal region compatible with fracture. No additional acute bony abnormality. Degenerative changes in the  lower lumbar spine and hips, right greater than left. IMPRESSION: Angulation noted in the region of the lower sacrum/upper coccyx on the lateral view compatible with fracture. Electronically Signed   By: Rolm Baptise M.D.   On: 07/03/2020 11:32   DG Ankle Complete Left  Result Date: 06/14/2020 CLINICAL DATA:  Left ankle deformity. Patient states does not know what happened has been in this position for 2 weeks. EXAM: LEFT ANKLE COMPLETE - 3+ VIEW COMPARISON:  Ankle radiograph 04/24/2017 FINDINGS: Transverse mildly displaced fracture through the medial malleolus. Suspected subtalar dislocation which is better assessed on concurrent foot exam. No definite distal fibular fracture. Tibial talar alignment is grossly maintained. Bones are diffusely under mineralized. There is generalized soft tissue edema of the ankle. IMPRESSION: 1. Transverse mildly displaced fracture through the medial malleolus. 2. Suspected subtalar dislocation which is better assessed on concurrent foot exam. 3. Generalized soft tissue edema of the ankle. Electronically Signed   By: Keith Rake M.D.   On: 06/14/2020 17:57   CT HEAD WO CONTRAST  Result Date: 07/03/2020 CLINICAL DATA:  Head trauma EXAM: CT HEAD WITHOUT CONTRAST TECHNIQUE: Contiguous axial images were obtained from the base of the skull through the vertex without intravenous contrast. COMPARISON:  03/31/2017 FINDINGS: Brain: There is atrophy and chronic small vessel disease changes. No acute intracranial abnormality. Specifically, no hemorrhage, hydrocephalus, mass lesion, acute infarction, or significant intracranial injury. Vascular: No hyperdense vessel or unexpected calcification. Skull: No acute calvarial abnormality. Sinuses/Orbits: Visualized paranasal sinuses and mastoids clear. Orbital soft tissues unremarkable. Other: None IMPRESSION: Atrophy, chronic microvascular disease. No acute intracranial abnormality. Electronically Signed   By: Rolm Baptise M.D.   On:  07/03/2020 11:49   CT Cervical Spine Wo Contrast  Result Date: 07/03/2020 CLINICAL DATA:  Neck trauma EXAM: CT CERVICAL SPINE WITHOUT CONTRAST TECHNIQUE: Multidetector CT imaging of the cervical spine was performed without intravenous contrast. Multiplanar CT image reconstructions were also generated. COMPARISON:  None. FINDINGS: Alignment: Normal Skull base and vertebrae: No acute fracture. No primary bone lesion or focal pathologic process. Soft tissues and spinal canal: No prevertebral fluid or swelling. No visible canal hematoma. Disc levels:  Diffuse degenerative disc disease and facet disease. Upper chest: No acute findings Other: None IMPRESSION: Diffuse degenerative disc and facet disease. No acute bony abnormality. Electronically Signed   By: Rolm Baptise M.D.   On: 07/03/2020 11:51   CT Ankle Left Wo Contrast  Result Date: 06/14/2020 CLINICAL DATA:  Ankle pain and fractures EXAM: CT OF THE LEFT ANKLE WITHOUT CONTRAST TECHNIQUE: Multidetector CT imaging of the left ankle was performed according to the standard protocol. Multiplanar CT image reconstructions were also generated. COMPARISON:  Radiographs 06/14/2020 FINDINGS: Despite efforts by the technologist and patient, severe motion artifact is present on today's exam and could not be eliminated. This reduces exam sensitivity and specificity. Bones/Joint/Cartilage Transverse  fracture of the medial malleolus. Avulsion fracture from the distal fibula anteriorly in the vicinity of the attachment site of the anterior inferior tibiofibular ligament. The multiplanar reconstructed images suggest the possibility of a posterior malleolar fracture but this is probably mainly due to motion artifact rather than a definite true fracture. As best I can tell there is widening of the space between the talus and the fibula and displacement of the transverse medial malleolar fracture characteristic of instability. If there is an oblique Weber B type lateral malleolar  fracture, it remains of cold, with the only well-defined distal fibular fracture being along the anterior head. Extensive hindfoot and midfoot spurring. Prominent talonavicular valgus with some mild subluxation. Expected alignment at the Lisfranc joint. Vascular remnant along the calcaneal neck. Plantar and Achilles calcaneal spurs. Ligaments Suboptimally assessed by CT. Muscles and Tendons I am skeptical of flexor tendon entrapment along the medial malleolar fracture although it is difficult to completely exclude given the motion artifact and blurring of the structures. Soft tissues Subcutaneous edema along the ankle. IMPRESSION: 1. Marked motion artifact obscures findings. 2. Transverse fracture of the medial malleolus, with suspected widening of the talofibular space suggesting instability, and a fracture of the anterior fibula likely at the attachment site of the anterior inferior tibiofibular ligament. If there are other fractures, there are obscured by the motion artifact. 3. I am skeptical of flexor tendon entrapment along the medial malleolar fracture although it is difficult to completely exclude given the motion artifact and blurring of the structures. 4. Extensive hindfoot and midfoot spurring. 5. Prominent talonavicular valgus with some mild subluxation. 6. Subcutaneous edema along the ankle. Electronically Signed   By: Van Clines M.D.   On: 06/14/2020 20:12   DG CHEST PORT 1 VIEW  Result Date: 06/20/2020 CLINICAL DATA:  Generalized weakness. Follow-up of basilar atelectasis. EXAM: PORTABLE CHEST 1 VIEW COMPARISON:  06/17/2020 and earlier. FINDINGS: Cardiac silhouette mildly enlarged, unchanged. Mild pulmonary venous hypertension without overt edema. Increased streaky and patchy airspace opacities at the RIGHT lung base since the examination 3 days ago. Stable mild atelectasis at the LEFT lung base. Note is again made of the chronic BILATERAL shoulder subluxation/dislocation. IMPRESSION: 1.  Worsening atelectasis and/or pneumonia at the RIGHT lung base. 2. Stable mild LEFT basilar atelectasis. 3. Stable mild cardiomegaly without pulmonary edema. Electronically Signed   By: Evangeline Dakin M.D.   On: 06/20/2020 15:02   DG CHEST PORT 1 VIEW  Result Date: 06/17/2020 CLINICAL DATA:  Respiratory distress EXAM: PORTABLE CHEST 1 VIEW COMPARISON:  06/15/2020 FINDINGS: Single frontal view of the chest demonstrates an unremarkable cardiac silhouette. Mild diffuse interstitial prominence is unchanged, compatible with scarring. No focal consolidation, effusion, or pneumothorax. Stable anterior subluxation or dislocation of the bilateral glenohumeral joints. IMPRESSION: 1. Stable exam, no acute process. Electronically Signed   By: Randa Ngo M.D.   On: 06/17/2020 15:47   DG CHEST PORT 1 VIEW  Result Date: 06/15/2020 CLINICAL DATA:  Shortness of breath EXAM: PORTABLE CHEST 1 VIEW COMPARISON:  04/06/2017 FINDINGS: Shallow inspiration. Heart size and pulmonary vascularity are normal for technique. Lungs are clear. No pleural effusions. No pneumothorax. Mediastinal contours appear intact. There appear to be anterior shoulder dislocations bilaterally. Old bilateral rib fractures. IMPRESSION: No evidence of active pulmonary disease. Bilateral anterior shoulder dislocations. Electronically Signed   By: Lucienne Capers M.D.   On: 06/15/2020 03:35   DG Knee Left Port  Result Date: 06/15/2020 CLINICAL DATA:  Knee pain EXAM: PORTABLE LEFT KNEE -  1-2 VIEW COMPARISON:  None. FINDINGS: Previous left total knee arthroplasty. Patella femoral component is present. Components appear well seated. No evidence of acute fracture or dislocation. No focal bone lesion. No significant effusions. IMPRESSION: Left total knee arthroplasty. Components appear well seated. No acute bony abnormalities. Electronically Signed   By: Lucienne Capers M.D.   On: 06/15/2020 03:36   DG Knee Right Port  Result Date: 06/15/2020 CLINICAL  DATA:  Knee pain EXAM: PORTABLE RIGHT KNEE - 1-2 VIEW COMPARISON:  01/22/2020 FINDINGS: Right total knee arthroplasty including patellar femoral component. Components appear well seated. No evidence of acute fracture or dislocation. No focal bone lesions. No significant effusions. No change since prior study. IMPRESSION: Right total knee arthroplasty. Components appear well seated. No acute bony abnormalities. Electronically Signed   By: Lucienne Capers M.D.   On: 06/15/2020 03:37   DG Foot Complete Left  Result Date: 06/14/2020 CLINICAL DATA:  Left ankle deformity. Patient states does not know what happened has been in this position for 2 weeks. EXAM: LEFT FOOT - COMPLETE 3+ VIEW COMPARISON:  None. FINDINGS: Suspected subtalar dislocation with lateral angulation of the majority of the foot with respect to the talus. Tibial talar alignment is maintained. There is lateral subluxation of the navicular at the talonavicular joint. No obvious acute foot fracture. Suspect remote fifth proximal phalanx fracture. There is a plantar calcaneal spur. Bones are diffusely under mineralized. Generalized soft tissue edema throughout the foot IMPRESSION: 1. Suspected subtalar dislocation with lateral angulation of the majority of the foot with respect to the talus. Lateral subluxation of the navicular at the talonavicular joint. 2. No obvious acute foot fracture. 3. Diffuse osteopenia/osteoporosis with generalized soft tissue edema. Electronically Signed   By: Keith Rake M.D.   On: 06/14/2020 18:02   XR Ankle Complete Left  Result Date: 07/07/2020 Radiographs of left ankle show stable alignment of fusion hardware. No complicating features.    Lab Results:  CBC    Component Value Date/Time   WBC 10.7 (H) 06/27/2020 0700   RBC 2.86 (L) 06/27/2020 0700   HGB 8.6 (L) 06/27/2020 0700   HCT 29.3 (L) 06/27/2020 0700   PLT 258 06/27/2020 0700   MCV 102.4 (H) 06/27/2020 0700   MCH 30.1 06/27/2020 0700   MCHC  29.4 (L) 06/27/2020 0700   RDW 14.9 06/27/2020 0700   LYMPHSABS 0.5 (L) 06/14/2020 1634   MONOABS 0.8 06/14/2020 1634   EOSABS 0.0 06/14/2020 1634   BASOSABS 0.0 06/14/2020 1634    BMET    Component Value Date/Time   NA 137 07/03/2020 1218   K 4.3 07/03/2020 1218   CL 97 (L) 07/03/2020 1218   CO2 28 07/03/2020 1218   GLUCOSE 164 (H) 07/03/2020 1218   BUN 22 07/03/2020 1218   CREATININE 5.16 (H) 07/03/2020 1218   CALCIUM 8.9 07/03/2020 1218   GFRNONAA 8 (L) 07/03/2020 1218   GFRAA 9 (L) 07/03/2020 1218    BNP No results found for: BNP  ProBNP No results found for: PROBNP  Specialty Problems      Pulmonary Problems   OSA (obstructive sleep apnea)   Dyspnea, unspecified   Obstructive sleep apnea   Acute on chronic respiratory failure with hypoxia and hypercapnia (HCC)      No Known Allergies  Immunization History  Administered Date(s) Administered  . Hepatitis B, adult 07/25/2019, 08/22/2019, 09/19/2019  . Influenza Inj Mdck Quad Pf 06/13/2018  . Influenza, High Dose Seasonal PF 07/11/2019  . [place] SARS-COVID-2 Vaccination  12/12/2019, 01/10/2020  . Pneumococcal Conjugate-13 09/12/2018  . Pneumococcal Polysaccharide-23 04/05/2017    Past Medical History:  Diagnosis Date  . Acute and chronic respiratory failure (acute-on-chronic) ([place])   . Anxiety   . Arthritis    osteo  . Chronic kidney disease   . Diabetes mellitus, type 2 (HCC)    Type II  . DVT (deep venous thrombosis) ([place]) 03/2017   left popliteal  . Facet joint disease   . Facet joint disease    foot  . Fibula fracture 02/2017   left  . GERD (gastroesophageal reflux disease)   . H/O blood clots   . Hyperlipemia   . Hyperlipidemia   . Hypertension   . Morbid obesity ([place])   . OSA (obstructive sleep apnea)    does not use CPAP   . Osteopenia   . Renal disorder    MWF dialysis  . Shortness of breath    with exertion  . Venous stasis ulcers ([place])   . Vitamin D deficiency     Tobacco  History: Social History   Tobacco Use  Smoking Status Former Smoker  . Years: 27.00  . Types: Cigarettes  . Quit date: 10/10/1981  . Years since quitting: 38.7  Smokeless Tobacco Never Used   Counseling given: Not Answered   Continue to not smoke  Outpatient Encounter Medications as of 07/07/2020  Medication Sig  . acetaminophen (TYLENOL) 500 MG tablet Take 1 tablet (500 mg total) by mouth every 6 (six) hours as needed for mild pain or headache.  . albuterol (VENTOLIN HFA) 108 (90 Base) MCG/ACT inhaler Inhale 2 puffs into the lungs every 6 (six) hours as needed for wheezing or shortness of breath.   Marland Kitchen albuterol (VENTOLIN HFA) 108 (90 Base) MCG/ACT inhaler Inhale 2 puffs into the lungs every 6 (six) hours as needed for shortness of breath.  Marland Kitchen apixaban (ELIQUIS) 2.5 MG TABS tablet Take 2.5 mg by mouth 2 (two) times daily.  Marland Kitchen apixaban (ELIQUIS) 2.5 MG TABS tablet Take 2.5 mg by mouth 2 (two) times daily.  . B-D INS SYR ULTRAFINE 1CC/30G 30G X 1/2" 1 ML MISC 1 each by Other route daily.   . Blood Glucose Monitoring Suppl (ONETOUCH VERIO FLEX SYSTEM) w/Device KIT   . calcium acetate (PHOSLO) 667 MG capsule Take 667 mg by mouth 4 (four) times daily.  . calcium acetate (PHOSLO) 667 MG tablet Take 667 mg by mouth See admin instructions. Take one tablet (667 mg) by mouth up to three times daily with meals and with a nutritional shake every night  . carbidopa-levodopa (SINEMET IR) 10-100 MG tablet Take 1 tablet by mouth in the morning and at bedtime.   . carbidopa-levodopa (SINEMET IR) 10-100 MG tablet Take 1 tablet by mouth 2 (two) times daily.  . colchicine 0.6 MG tablet Take 0.5 tablets (0.3 mg total) by mouth daily.  . colchicine 0.6 MG tablet Take 0.3 mg by mouth daily.  Marland Kitchen doxercalciferol (HECTOROL) 4 MCG/2ML injection Inject 1 mL (2 mcg total) into the vein every Monday, Wednesday, and Friday with hemodialysis.  . febuxostat (ULORIC) 40 MG tablet Take 40 mg by mouth daily.  Marland Kitchen gabapentin  (NEURONTIN) 100 MG capsule Take 1 capsule (100 mg total) by mouth at bedtime.  . gabapentin (NEURONTIN) 100 MG capsule Take 100 mg by mouth daily.  Marland Kitchen glucose blood test strip 1 each by Other route daily.   . insulin NPH Human (NOVOLIN N) 100 UNIT/ML injection Inject 0.1 mLs (10 Units  total) into the skin 2 (two) times daily at 8 am and 10 pm.  . Insulin NPH, Human,, Isophane, (HUMULIN N KWIKPEN) 100 UNIT/ML Kiwkpen Inject 10 Units into the skin 2 (two) times daily.  . Lancets (ONETOUCH DELICA PLUS [id number]) MISC 1 each by Other route daily.   . Multiple Vitamin (MULTIVITAMIN WITH MINERALS) TABS tablet Take 1 tablet by mouth daily.  . multivitamin (RENA-VIT) TABS tablet Take 1 tablet by mouth daily.  Marland Kitchen omeprazole (PRILOSEC) 20 MG capsule Take 20 mg by mouth daily before breakfast.   . omeprazole (PRILOSEC) 20 MG capsule Take 20 mg by mouth daily.  Marland Kitchen oxyCODONE (OXY IR/ROXICODONE) 5 MG immediate release tablet Take 1 tablet (5 mg total) by mouth every 6 (six) hours as needed for moderate pain or severe pain.  Marland Kitchen oxyCODONE (OXY IR/ROXICODONE) 5 MG immediate release tablet Take 5 mg by mouth every 6 (six) hours as needed for moderate pain or severe pain.  . OXYGEN Inhale 2 L into the lungs as needed (shortness of breath).  Marland Kitchen rOPINIRole (REQUIP) 4 MG tablet Take 4 mg by mouth 2 (two) times daily.  Marland Kitchen rOPINIRole (REQUIP) 4 MG tablet Take 4 mg by mouth 2 (two) times daily.  . Zinc Oxide (TRIPLE PASTE) 12.8 % ointment Apply topically 4 (four) times daily.   No facility-administered encounter medications on file as of 07/07/2020.     Review of Systems  Review of Systems  Constitutional: Positive for fatigue. Negative for activity change and fever.  HENT: Positive for congestion. Negative for sinus pressure, sinus pain and sore throat.   Respiratory: Positive for shortness of breath. Negative for cough and wheezing.   Cardiovascular: Negative for chest pain and palpitations.  Gastrointestinal:  Negative for diarrhea, nausea and vomiting.  Musculoskeletal: Negative for arthralgias.  Neurological: Negative for dizziness.  Psychiatric/Behavioral: Positive for sleep disturbance. The patient is not nervous/anxious.      Physical Exam  BP 130/70 (BP Location: Left Arm, Cuff Size: Normal)   Pulse 85   Temp (!) 97.3 F (36.3 C) (Oral)   Ht '5\' 3"'  (1.6 m)   Wt 193 lb (87.5 kg)   SpO2 98%   BMI 34.19 kg/m   Wt Readings from Last 5 Encounters:  07/07/20 193 lb (87.5 kg)  07/07/20 193 lb (87.5 kg)  07/03/20 193 lb (87.5 kg)  06/27/20 182 lb 15.7 oz (83 kg)  01/24/20 216 lb 4.3 oz (98.1 kg)    BMI Readings from Last 5 Encounters:  07/07/20 34.19 kg/m  07/07/20 37.69 kg/m  07/03/20 37.69 kg/m  06/27/20 35.74 kg/m  01/24/20 42.24 kg/m     Physical Exam Vitals and nursing note reviewed.  Constitutional:      General: She is not in acute distress.    Appearance: Normal appearance. She is obese.  HENT:     Head: Normocephalic and atraumatic.     Right Ear: External ear normal.     Left Ear: External ear normal.  Eyes:     Pupils: Pupils are equal, round, and reactive to light.  Cardiovascular:     Rate and Rhythm: Normal rate and regular rhythm.     Pulses: Normal pulses.     Heart sounds: Normal heart sounds. No murmur heard.      Comments: Distant heart tones Pulmonary:     Effort: Pulmonary effort is normal. No respiratory distress.     Breath sounds: No decreased air movement. No decreased breath sounds, wheezing or rales.  Comments: Distant breath sounds  Musculoskeletal:     Cervical back: Normal range of motion.       Legs:  Skin:    General: Skin is warm and dry.     Capillary Refill: Capillary refill takes less than 2 seconds.  Neurological:     General: No focal deficit present.     Mental Status: She is alert and oriented to person, place, and time. Mental status is at baseline.     Motor: Weakness present.     Gait: Gait abnormal.      Comments: In wheelchair  Psychiatric:        Mood and Affect: Mood normal.        Behavior: Behavior normal.        Thought Content: Thought content normal.        Judgment: Judgment normal.       Assessment & Plan:   OSA (obstructive sleep apnea) Known severe obstructive sleep apnea Patient has struggled with mask fit in the past Recent hospitalization due to hypercarbic respiratory failure Treated with BiPAP in the hospital  Discussion: Reviewed with patient that I will discuss case with Dr. Jenetta Downer.  We will decide what would be the best option moving forward will this to be trying to start patient on BiPAP therapy or start noninvasive ventilation given her hypercarbic respiratory failure and severe obstructive sleep apnea.  Plan: Close follow-up scheduled with Dr. Jenetta Downer to further discuss especially since patient was last seen in 2019.  Acute on chronic respiratory failure with hypoxia and hypercapnia ([place]) Plan: We will continue to clinically monitor Likely patient will need to start BiPAP therapy or noninvasive ventilator  BMI 34.0-34.9,adult Plan: Continue to work with primary care on working to reduce BMI     Return in about 4 weeks (around 08/04/2020), or if symptoms worsen or fail to improve, for Follow up with Dr. Ander Slade.   Lauraine Rinne, NP 07/07/2020   This appointment required 34 minutes of patient care (this includes precharting, chart review, review of results, face-to-face care, etc.).

## 2020-07-07 NOTE — Assessment & Plan Note (Signed)
Plan: We will continue to clinically monitor Likely patient will need to start BiPAP therapy or noninvasive ventilator

## 2020-07-07 NOTE — Progress Notes (Signed)
Post-Op Visit Note   Patient: Jean Mcclain           Date of Birth: [birth year]           MRN: [id number] Visit Date: 07/07/2020 PCP: Caprice Renshaw, MD  Chief Complaint:  Chief Complaint  Patient presents with  . Left Ankle - Follow-up    Left tibiocalcaneal fusion 06/24/2020    HPI:  HPI The patient is a 74 year old woman seen status post left calcaneal fusion on September 15.  She is residing at skilled nursing.  She is nonweightbearing her wound VAC was removed today she has a cam boot  She complains of heel pain  Ortho Exam On examination of the left ankle she has mild to moderate edema the incision is well approximated with sutures there is scant bloody drainage there is no gaping no erythema no sign of infection.  On examination of her heel the skin is intact there is no discoloration  Visit Diagnoses:  1. Pain in left ankle and joints of left foot     Plan: Discussed with her the importance of offloading the heels.  Did provide orders to skilled nursing to float her heels.  She will continue the cam walker around-the-clock.  Also cleansing of her incision dry dressing changes elevation for swelling nonweightbearing  Follow-Up Instructions: Return in about 2 weeks (around 07/21/2020).   Imaging: XR Ankle Complete Left  Result Date: 07/07/2020 Radiographs of left ankle show stable alignment of fusion hardware. No complicating features.    Orders:  Orders Placed This Encounter  Procedures  . XR Ankle Complete Left   No orders of the defined types were placed in this encounter.    PMFS History: Patient Active Problem List   Diagnosis Date Noted  . Acute blood loss anemia 06/25/2020  . Acute on chronic respiratory failure with hypoxia and hypercapnia ([place]) 06/25/2020  . Advanced care planning/counseling discussion   . Goals of care, counseling/discussion   . Obstructive sleep apnea   . Primary osteoarthritis, left ankle and foot   . Ankle fracture 06/14/2020   . HLD (hyperlipidemia) 06/14/2020  . Dyspnea, unspecified 02/05/2020  . Intractable pain 01/24/2020  . Bilateral shoulder pain 01/23/2020  . Right knee pain 01/23/2020  . Uncontrolled type 2 diabetes mellitus with hyperglycemia, with long-term current use of insulin ([place]) 01/23/2020  . Pressure injury of skin 01/22/2020  . Mild protein-calorie malnutrition ([place]) 12/16/2019  . Personal history of anaphylaxis 12/05/2019  . Pruritus, unspecified 12/05/2019  . ESRD on hemodialysis ([place]) 11/20/2019  . Myoclonus, segmental 11/20/2019  . Allergy, unspecified, initial encounter 07/29/2019  . Anemia in chronic kidney disease 07/15/2019  . Coagulation defect, unspecified ([place]) 07/13/2019  . Iron deficiency anemia, unspecified 07/11/2019  . Encounter for immunization 07/06/2019  . End stage renal disease ([place]) 07/02/2019  . Gastro-esophageal reflux disease without esophagitis 07/02/2019  . Low back pain 07/02/2019  . Morbid (severe) obesity due to excess calories ([place]) 07/02/2019  . Other specified disorders of bone density and structure, unspecified site 07/02/2019  . Secondary hyperparathyroidism of renal origin ([place]) 07/02/2019  . Unspecified osteoarthritis, unspecified site 07/02/2019  . OSA (obstructive sleep apnea) 03/13/2019  . Chronic acquired lymphedema 11/05/2018  . Acute deep vein thrombosis (DVT) of popliteal vein of left lower extremity ([place]) 03/11/2017  . Insulin-requiring or dependent type II diabetes mellitus ([place]) 03/11/2017  . Essential hypertension 03/11/2017  . Impingement syndrome of left ankle 01/31/2017  . Posterior tibial tendinitis, left leg 01/31/2017  .  Incarcerated ventral hernia 01/02/2013   Past Medical History:  Diagnosis Date  . Acute and chronic respiratory failure (acute-on-chronic) ([place])   . Anxiety   . Arthritis    osteo  . Chronic kidney disease   . Diabetes mellitus, type 2 (HCC)    Type II  . DVT (deep venous thrombosis) ([place]) 03/2017   left  popliteal  . Facet joint disease   . Facet joint disease    foot  . Fibula fracture 02/2017   left  . GERD (gastroesophageal reflux disease)   . H/O blood clots   . Hyperlipemia   . Hyperlipidemia   . Hypertension   . Morbid obesity ([place])   . OSA (obstructive sleep apnea)    does not use CPAP   . Osteopenia   . Renal disorder    MWF dialysis  . Shortness of breath    with exertion  . Venous stasis ulcers ([place])   . Vitamin D deficiency     Family History  Problem Relation Age of Onset  . Leukemia Mother   . Hypertension Brother   . Emphysema Father     Past Surgical History:  Procedure Laterality Date  . ANKLE FUSION Left 06/24/2020   Procedure: LEFT TIBIOCALCANEAL FUSION;  Surgeon: [name], MD;  Location: [facility];  Service: Orthopedics;  Laterality: Left;  . AV FISTULA PLACEMENT Right 08/20/2018   Procedure: ARTERIOVENOUS (AV) FISTULA CREATION BRACHIOCEPHALIC;  Surgeon: Angelia Mould, MD;  Location: [facility];  Service: Vascular;  Laterality: Right;  . BREAST SURGERY Right 03/1997   biospy  . CARPAL TUNNEL RELEASE Left 2016  . INSERTION OF MESH N/A 01/08/2013   Procedure: INSERTION OF MESH;  Surgeon: Earnstine Regal, MD;  Location: [facility];  Service: General;  Laterality: N/A;  . IR THORACENTESIS ASP PLEURAL SPACE W/IMG GUIDE  04/06/2017  . JOINT REPLACEMENT Right 1999  . TOTAL KNEE ARTHROPLASTY Left 1999   Left  . TRIGGER FINGER RELEASE Left 11/2014  . TRIGGER FINGER RELEASE Right   . VENTRAL HERNIA REPAIR N/A 01/08/2013   Procedure: LAPAROSCOPIC VENTRAL HERNIA;  Surgeon: Earnstine Regal, MD;  Location: [facility];  Service: General;  Laterality: N/A;   Social History   Occupational History  . Not on file  Tobacco Use  . Smoking status: Former Smoker    Years: 27.00    Types: Cigarettes    Quit date: 10/10/1981    Years since quitting: 38.7  . Smokeless tobacco: Never Used  Vaping Use  . Vaping Use: Never used  Substance and Sexual Activity  . Alcohol use: No   . Drug use: No  . Sexual activity: Not on file

## 2020-07-07 NOTE — Assessment & Plan Note (Signed)
Plan: Continue to work with primary care on working to reduce BMI

## 2020-07-07 NOTE — Assessment & Plan Note (Signed)
Known severe obstructive sleep apnea Patient has struggled with mask fit in the past Recent hospitalization due to hypercarbic respiratory failure Treated with BiPAP in the hospital  Discussion: Reviewed with patient that I will discuss case with Dr. Jenetta Downer.  We will decide what would be the best option moving forward will this to be trying to start patient on BiPAP therapy or start noninvasive ventilation given her hypercarbic respiratory failure and severe obstructive sleep apnea.  Plan: Close follow-up scheduled with Dr. Jenetta Downer to further discuss especially since patient was last seen in 2019.

## 2020-07-08 ENCOUNTER — Telehealth: Payer: Self-pay | Admitting: Pulmonary Disease

## 2020-07-08 NOTE — Telephone Encounter (Signed)
07/08/2020  I have discussed the case with Dr. Jenetta Downer.  We would recommend the patient start noninvasive ventilation at night.  This should be sufficient regarding patient's known severe obstructive sleep apnea as well as hypercarbic respiratory failure.  Can we please contact [name] representatives to see if patient would be adequate candidate given hypercarbia and severe obstructive sleep apnea.  10/29/2018-split-night sleep study-AHI 66.2 06/17/2020-ABG-pH 7.23, PCO2 80.8, PO2 53.9, bicarb 32.9  Patient's last arterial blood gas from the hospitalization in September/2021 is listed below showing elevated PCO2 hypercarbic respiratory failure.  Patient's last split-night sleep study was in January/2020 which showed severe obstructive sleep apnea as listed above.  She is not currently on Pap therapy at this point time.  She was treated in the hospital with BiPAP.  Wyn Quaker, FNP

## 2020-07-08 NOTE — Telephone Encounter (Signed)
I called [facility] to speak to [facility] and she was not available and Tasia, who I spoke to, had no idea on how to help so will try [name] again tomorrow.

## 2020-07-09 ENCOUNTER — Other Ambulatory Visit: Payer: Self-pay | Admitting: Pulmonary Disease

## 2020-07-09 DIAGNOSIS — J9621 Acute and chronic respiratory failure with hypoxia: Secondary | ICD-10-CM

## 2020-07-09 DIAGNOSIS — E1022 Type 1 diabetes mellitus with diabetic chronic kidney disease: Secondary | ICD-10-CM | POA: Diagnosis not present

## 2020-07-09 DIAGNOSIS — N186 End stage renal disease: Secondary | ICD-10-CM | POA: Diagnosis not present

## 2020-07-09 DIAGNOSIS — G4733 Obstructive sleep apnea (adult) (pediatric): Secondary | ICD-10-CM

## 2020-07-09 DIAGNOSIS — Z992 Dependence on renal dialysis: Secondary | ICD-10-CM | POA: Diagnosis not present

## 2020-07-09 NOTE — Telephone Encounter (Signed)
I tried to call and speak with [name] at [facility]. Left a VM for her to call back. Will await her call.

## 2020-07-09 NOTE — Telephone Encounter (Signed)
I have not heard back from [facility] so I sent her a community message as well. Will await her response.

## 2020-07-09 NOTE — Telephone Encounter (Signed)
Jean Mcclain from [facility] returned the call and went ahead and sent in order to [facility] for new Trelegy machine start. Jean Mcclain will let me know if she needs anything further. Nothing else needed at this time.

## 2020-07-10 DIAGNOSIS — N186 End stage renal disease: Secondary | ICD-10-CM | POA: Diagnosis not present

## 2020-07-10 DIAGNOSIS — Z992 Dependence on renal dialysis: Secondary | ICD-10-CM | POA: Diagnosis not present

## 2020-07-10 DIAGNOSIS — D689 Coagulation defect, unspecified: Secondary | ICD-10-CM | POA: Diagnosis not present

## 2020-07-10 DIAGNOSIS — D631 Anemia in chronic kidney disease: Secondary | ICD-10-CM | POA: Diagnosis not present

## 2020-07-10 DIAGNOSIS — D509 Iron deficiency anemia, unspecified: Secondary | ICD-10-CM | POA: Diagnosis not present

## 2020-07-14 DIAGNOSIS — S82892A Other fracture of left lower leg, initial encounter for closed fracture: Secondary | ICD-10-CM | POA: Diagnosis not present

## 2020-07-14 DIAGNOSIS — Z86718 Personal history of other venous thrombosis and embolism: Secondary | ICD-10-CM | POA: Diagnosis not present

## 2020-07-20 DIAGNOSIS — S91302D Unspecified open wound, left foot, subsequent encounter: Secondary | ICD-10-CM | POA: Diagnosis not present

## 2020-07-20 DIAGNOSIS — Z86718 Personal history of other venous thrombosis and embolism: Secondary | ICD-10-CM | POA: Diagnosis not present

## 2020-07-20 DIAGNOSIS — E785 Hyperlipidemia, unspecified: Secondary | ICD-10-CM | POA: Diagnosis not present

## 2020-07-20 DIAGNOSIS — N186 End stage renal disease: Secondary | ICD-10-CM | POA: Diagnosis not present

## 2020-07-20 DIAGNOSIS — E1169 Type 2 diabetes mellitus with other specified complication: Secondary | ICD-10-CM | POA: Diagnosis not present

## 2020-07-20 DIAGNOSIS — Z992 Dependence on renal dialysis: Secondary | ICD-10-CM | POA: Diagnosis not present

## 2020-07-20 DIAGNOSIS — S82892A Other fracture of left lower leg, initial encounter for closed fracture: Secondary | ICD-10-CM | POA: Diagnosis not present

## 2020-07-21 ENCOUNTER — Ambulatory Visit: Payer: Self-pay

## 2020-07-21 ENCOUNTER — Other Ambulatory Visit: Payer: Self-pay

## 2020-07-21 ENCOUNTER — Emergency Department (HOSPITAL_COMMUNITY)
Admission: EM | Admit: 2020-07-21 | Discharge: 2020-07-21 | Disposition: A | Discharge status: Home / Self Care | Payer: HMO | Attending: Emergency Medicine | Admitting: Emergency Medicine

## 2020-07-21 ENCOUNTER — Ambulatory Visit (INDEPENDENT_AMBULATORY_CARE_PROVIDER_SITE_OTHER): Payer: HMO | Admitting: Family

## 2020-07-21 ENCOUNTER — Ambulatory Visit: Payer: HMO | Admitting: Pulmonary Disease

## 2020-07-21 ENCOUNTER — Encounter: Payer: Self-pay | Admitting: Family

## 2020-07-21 DIAGNOSIS — Z87891 Personal history of nicotine dependence: Secondary | ICD-10-CM | POA: Diagnosis not present

## 2020-07-21 DIAGNOSIS — S4991XA Unspecified injury of right shoulder and upper arm, initial encounter: Secondary | ICD-10-CM | POA: Diagnosis present

## 2020-07-21 DIAGNOSIS — Z7901 Long term (current) use of anticoagulants: Secondary | ICD-10-CM | POA: Insufficient documentation

## 2020-07-21 DIAGNOSIS — N189 Chronic kidney disease, unspecified: Secondary | ICD-10-CM | POA: Diagnosis not present

## 2020-07-21 DIAGNOSIS — S4992XA Unspecified injury of left shoulder and upper arm, initial encounter: Secondary | ICD-10-CM | POA: Diagnosis not present

## 2020-07-21 DIAGNOSIS — I120 Hypertensive chronic kidney disease with stage 5 chronic kidney disease or end stage renal disease: Secondary | ICD-10-CM | POA: Insufficient documentation

## 2020-07-21 DIAGNOSIS — M25572 Pain in left ankle and joints of left foot: Secondary | ICD-10-CM

## 2020-07-21 DIAGNOSIS — N186 End stage renal disease: Secondary | ICD-10-CM | POA: Diagnosis not present

## 2020-07-21 DIAGNOSIS — Z992 Dependence on renal dialysis: Secondary | ICD-10-CM | POA: Diagnosis not present

## 2020-07-21 DIAGNOSIS — W050XXA Fall from non-moving wheelchair, initial encounter: Secondary | ICD-10-CM | POA: Diagnosis not present

## 2020-07-21 DIAGNOSIS — M25511 Pain in right shoulder: Secondary | ICD-10-CM

## 2020-07-21 DIAGNOSIS — M25512 Pain in left shoulder: Secondary | ICD-10-CM

## 2020-07-21 DIAGNOSIS — Z794 Long term (current) use of insulin: Secondary | ICD-10-CM | POA: Insufficient documentation

## 2020-07-21 DIAGNOSIS — E1165 Type 2 diabetes mellitus with hyperglycemia: Secondary | ICD-10-CM | POA: Diagnosis not present

## 2020-07-21 DIAGNOSIS — M24312 Pathological dislocation of left shoulder, not elsewhere classified: Secondary | ICD-10-CM | POA: Diagnosis not present

## 2020-07-21 DIAGNOSIS — M24311 Pathological dislocation of right shoulder, not elsewhere classified: Secondary | ICD-10-CM | POA: Diagnosis not present

## 2020-07-21 DIAGNOSIS — Z96652 Presence of left artificial knee joint: Secondary | ICD-10-CM | POA: Insufficient documentation

## 2020-07-21 NOTE — ED Notes (Signed)
[facility] called @ 1840-per Deidre Ala, RN called by Levada Dy

## 2020-07-21 NOTE — ED Provider Notes (Signed)
Medical screening examination/treatment/procedure(s) were conducted as a shared visit with non-physician practitioner(s) and myself.  I personally evaluated the patient during the encounter.      Patient seen by me along with physician assistant.  Patient referred over from Ortho care.  Followed by Dr. Sharol Given had surgery on her left ankle foot.  Was follow-up for suture removal.  Patient stated that she was having trouble with both arms and difficulty feeding herself.  They obtained x-rays of both shoulders which showed anterior dislocations.  However patient was admitted on September 5 was complaining of the ankle injury at that time.  Not exactly sure when the injury occurred but the identified injury to the ankle that time patient was also in respiratory distress at that time.  Patient had 3 chest x-rays portable during that hospitalization first 1 being done on September 6 showed either significant subluxation or dislocation of both shoulders.  Did not ever receive any dedicated x-rays of the shoulders.  So clearly there was dislocation or significant for subluxation then.  Discussed with on-call physician for [facility]o [facility].  He agreed that since they have been out for over a month that they are not going to go back into the and I cannot stay back in.  Recommended a sling for one of the arms and then get her back to Dr. Sharol Given for follow-up.  They will probably obtain outpatient MRIs  Patient's radial pulses are good and strong in both upper extremities.  Manipulation of both arms they do seem as if they are very lax and seem as if they are trying to line up with a will not stay in place.  Patient with a little more discomfort in the left upper extremity so we will place a sling on that arm.  Patient knows to follow-up with Dr. Sharol Given.   Fredia Sorrow, MD 07/21/20 [phone]

## 2020-07-21 NOTE — ED Triage Notes (Signed)
To triage via EMS from ortho care.  Pt lives at [facility]  Onset 3 weeks ago pt was dropped from [employer] lift to ground.   Seen at ortho care today for left great toe numbness on foot that has a broken ankle.  Pt was found to have dislocated bilateral shoulder and broke coccyx.  EMS BP 11/060 HR 80 SpO2 99%  Had dialysis yesterday.  Is on home oxygen @ 3L via [place].

## 2020-07-21 NOTE — ED Provider Notes (Signed)
[facility] Provider Note   CSN: [id number] Arrival date & time: 07/21/20  1211     History Chief Complaint  Patient presents with  . Shoulder Injury    [facility] is a 74 y.o. female.  HPI Patient is a 74 year old female with past medical history detailed below presented today for bilateral shoulder dislocation this was confirmed by x-ray today however on review of prior x-rays it appears that it was dislocated as early as September 6.  She seems to have had a fall somewhere around 1 month ago she is a poor historian on specifics but states that she has had decreased ability to move her arm since that time.  She has been seen by orthopedics since that time and actually has also been admitted to the hospital.  Patient denies any recent falls, trauma, injury other than the injury in September. Denies any head injuries or loss of consciousness denies nausea, vomiting states that she does her dialysis Monday Wednesday Friday and had a full session on Friday and is planning on attending her seat time Monday.  No other symptoms today.  She states that her left shoulder pain is achy, constant, worse with movement and that she has had difficulty feeding herself of the left arm she has long since stopped try to feed herself with right arm because of inability to move her arm well.  Likely secondary to the dislocation.    Past Medical History:  Diagnosis Date  . Acute and chronic respiratory failure (acute-on-chronic) ([place])   . Anxiety   . Arthritis    osteo  . Chronic kidney disease   . Diabetes mellitus, type 2 (HCC)    Type II  . DVT (deep venous thrombosis) ([place]) 03/2017   left popliteal  . Facet joint disease   . Facet joint disease    foot  . Fibula fracture 02/2017   left  . GERD (gastroesophageal reflux disease)   . H/O blood clots   . Hyperlipemia   . Hyperlipidemia   . Hypertension   . Morbid obesity ([place])   . OSA  (obstructive sleep apnea)    does not use CPAP   . Osteopenia   . Renal disorder    MWF dialysis  . Shortness of breath    with exertion  . Venous stasis ulcers ([place])   . Vitamin D deficiency     Patient Active Problem List   Diagnosis Date Noted  . Acute blood loss anemia 06/25/2020  . Acute on chronic respiratory failure with hypoxia and hypercapnia ([place]) 06/25/2020  . Advanced care planning/counseling discussion   . Goals of care, counseling/discussion   . Obstructive sleep apnea   . Primary osteoarthritis, left ankle and foot   . Ankle fracture 06/14/2020  . HLD (hyperlipidemia) 06/14/2020  . Dyspnea, unspecified 02/05/2020  . Intractable pain 01/24/2020  . Bilateral shoulder pain 01/23/2020  . Right knee pain 01/23/2020  . Uncontrolled type 2 diabetes mellitus with hyperglycemia, with long-term current use of insulin ([place]) 01/23/2020  . Pressure injury of skin 01/22/2020  . Mild protein-calorie malnutrition ([place]) 12/16/2019  . Personal history of anaphylaxis 12/05/2019  . Pruritus, unspecified 12/05/2019  . ESRD on hemodialysis ([place]) 11/20/2019  . Myoclonus, segmental 11/20/2019  . Allergy, unspecified, initial encounter 07/29/2019  . Anemia in chronic kidney disease 07/15/2019  . Coagulation defect, unspecified ([place]) 07/13/2019  . Iron deficiency anemia, unspecified 07/11/2019  . Encounter for immunization 07/06/2019  . End stage  renal disease ([place]) 07/02/2019  . Gastro-esophageal reflux disease without esophagitis 07/02/2019  . Low back pain 07/02/2019  . BMI 34.0-34.9,adult 07/02/2019  . Other specified disorders of bone density and structure, unspecified site 07/02/2019  . Secondary hyperparathyroidism of renal origin ([place]) 07/02/2019  . Unspecified osteoarthritis, unspecified site 07/02/2019  . OSA (obstructive sleep apnea) 03/13/2019  . Chronic acquired lymphedema 11/05/2018  . Acute deep vein thrombosis (DVT) of popliteal vein of left lower extremity ([place])  03/11/2017  . Insulin-requiring or dependent type II diabetes mellitus ([place]) 03/11/2017  . Essential hypertension 03/11/2017  . Impingement syndrome of left ankle 01/31/2017  . Posterior tibial tendinitis, left leg 01/31/2017  . Incarcerated ventral hernia 01/02/2013    Past Surgical History:  Procedure Laterality Date  . ANKLE FUSION Left 06/24/2020   Procedure: LEFT TIBIOCALCANEAL FUSION;  Surgeon: [name], MD;  Location: [facility];  Service: Orthopedics;  Laterality: Left;  . AV FISTULA PLACEMENT Right 08/20/2018   Procedure: ARTERIOVENOUS (AV) FISTULA CREATION BRACHIOCEPHALIC;  Surgeon: Angelia Mould, MD;  Location: [facility];  Service: Vascular;  Laterality: Right;  . BREAST SURGERY Right 03/1997   biospy  . CARPAL TUNNEL RELEASE Left 2016  . INSERTION OF MESH N/A 01/08/2013   Procedure: INSERTION OF MESH;  Surgeon: Earnstine Regal, MD;  [facility];  Service: General;  Laterality: N/A;  . IR THORACENTESIS ASP PLEURAL SPACE W/IMG GUIDE  04/06/2017  . JOINT REPLACEMENT Right 1999  . TOTAL KNEE ARTHROPLASTY Left 1999   Left  . TRIGGER FINGER RELEASE Left 11/2014  . TRIGGER FINGER RELEASE Right   . VENTRAL HERNIA REPAIR N/A 01/08/2013   Procedure: LAPAROSCOPIC VENTRAL HERNIA;  Surgeon: Earnstine Regal, MD;  [facility];  Service: General;  Laterality: N/A;     OB History   No obstetric history on file.     Family History  Problem Relation Age of Onset  . Leukemia Mother   . Hypertension Brother   . Emphysema Father     Social History   Tobacco Use  . Smoking status: Former Smoker    Years: 27.00    Types: Cigarettes    Quit date: 10/10/1981    Years since quitting: 38.8  . Smokeless tobacco: Never Used  Vaping Use  . Vaping Use: Never used  Substance Use Topics  . Alcohol use: No  . Drug use: No    Home Medications Prior to Admission medications   Medication Sig Start Date End Date Taking? Authorizing Provider  acetaminophen (TYLENOL) 500 MG tablet  Take 1 tablet (500 mg total) by mouth every 6 (six) hours as needed for mild pain or headache. 06/26/20  Yes Samuella Cota, MD  albuterol (VENTOLIN HFA) 108 (90 Base) MCG/ACT inhaler Inhale 2 puffs into the lungs every 6 (six) hours as needed for wheezing or shortness of breath.  03/26/20  Yes [provider]  apixaban (ELIQUIS) 2.5 MG TABS tablet Take 2.5 mg by mouth 2 (two) times daily.   Yes [provider]  calcium acetate (PHOSLO) 667 MG tablet Take 667 mg by mouth in the morning, at noon, in the evening, and at bedtime.  11/28/19  Yes [provider]  carbidopa-levodopa (SINEMET IR) 10-100 MG tablet Take 1 tablet by mouth 2 (two) times daily.   Yes [provider]  colchicine 0.6 MG tablet Take 0.3 mg by mouth daily.   Yes [provider]  doxercalciferol (HECTOROL) 4 MCG/2ML injection Inject 1 mL (2 mcg  total) into the vein every Monday, Wednesday, and Friday with hemodialysis. 01/27/20  Yes [place] Eric J, DO  febuxostat (ULORIC) 40 MG tablet Take 40 mg by mouth daily.   Yes [provider]  gabapentin (NEURONTIN) 100 MG capsule Take 1 capsule (100 mg total) by mouth at bedtime. 06/26/20  Yes Samuella Cota, MD  insulin NPH Human (NOVOLIN N) 100 UNIT/ML injection Inject 0.1 mLs (10 Units total) into the skin 2 (two) times daily at 8 am and 10 pm. Patient taking differently: Inject 10 Units into the skin 2 (two) times daily before a meal.  06/26/20  Yes Samuella Cota, MD  Multiple Vitamin (MULTIVITAMIN WITH MINERALS) TABS tablet Take 1 tablet by mouth daily.   Yes [provider]  omeprazole (PRILOSEC) 20 MG capsule Take 20 mg by mouth daily before breakfast.    Yes [provider]  oxyCODONE (OXY IR/ROXICODONE) 5 MG immediate release tablet Take 1 tablet (5 mg total) by mouth every 6 (six) hours as needed for moderate pain or severe pain. 06/26/20  Yes Samuella Cota, MD  rOPINIRole (REQUIP) 4 MG tablet Take 4  mg by mouth 2 (two) times daily. 05/27/20  Yes [provider]  Zinc Oxide (TRIPLE PASTE) 12.8 % ointment Apply topically 4 (four) times daily. Patient taking differently: Apply 1 application topically 4 (four) times daily.  06/26/20  Yes Samuella Cota, MD  B-D INS SYR ULTRAFINE 1CC/30G 30G X 1/2" 1 ML MISC 1 each by Other route daily.  12/23/19   [provider]  Blood Glucose Monitoring Suppl ([place]) w/Device KIT  03/23/20   [provider]  glucose blood test strip 1 each by Other route daily.     [provider]  Lancets [facility] DELICA PLUS [id number]) [place] 1 each by Other route daily.  03/05/19   [provider]  OXYGEN Inhale 2 L into the lungs as needed (shortness of breath).    [provider]    Allergies    Patient has no known allergies.  Review of Systems   Review of Systems  Physical Exam Updated Vital Signs BP (!) 98/35 (BP Location: Left Arm)   Pulse 80   Temp 98.6 F (37 C) (Oral)   Resp (!) 22   Ht 5' (1.524 m)   Wt 87.1 kg   SpO2 99%   BMI 37.50 kg/m   Physical Exam Vitals and nursing note reviewed.  Constitutional:      General: She is not in acute distress.    Appearance: Normal appearance. She is not ill-appearing.  HENT:     Head: Normocephalic and atraumatic.  Eyes:     General: No scleral icterus.       Right eye: No discharge.        Left eye: No discharge.     Conjunctiva/sclera: Conjunctivae normal.  Cardiovascular:     Rate and Rhythm: Normal rate.     Comments: Pulses 3+ symmetric BL radial pulses Pulmonary:     Effort: Pulmonary effort is normal.     Breath sounds: No stridor.  Musculoskeletal:     Comments: Able to passively range BL shoulders completely.  Unable to actively range either shoulder significantly but able to move wrists/elbows well.   Palpable anterior humeral head bilaterally  Skin:    General: Skin is warm and dry.     Capillary Refill:  Capillary refill takes less than 2 seconds.     Comments:  RUE with fistula w good thrill.   Neurological:     Mental Status: She is alert and oriented to person, place, and time. Mental status is at baseline.     Comments: Sensation intact BL upper extremities  Psychiatric:        Mood and Affect: Mood normal.        Behavior: Behavior normal.     ED Results / Procedures / Treatments   Labs (all labs ordered are listed, but only abnormal results are displayed) Labs Reviewed - No data to display  EKG None  Radiology No results found.  Procedures Procedures (including critical care time)  Medications Ordered in ED Medications - No data to display  ED Course  I have reviewed the triage vital signs and the nursing notes.  Pertinent labs & imaging results that were available during my care of the patient were reviewed by me and considered in my medical decision making (see chart for details).    MDM Rules/Calculators/A&P                          Pt is 74 year old female with significant past medical history detailed in HPI presented today for bilateral shoulder dislocation sent from orthopedic clinic.  Physical exam is notable for bilateral palpable humeral head with anterior dislocations.  She does have some active range of motion and full passive range of motion.  Good pulses and good sensation.  She is not in significant discomfort as long as her left arm is not being moved.  No designated shoulder x-rays in the past 2 months however patient did have chest x-ray that she has bilateral shoulder dislocation versus subluxation September 6.  Suspect that patient had bilateral shoulder dislocation at this time.  I discussed this case with my attending physician who cosigned this note including patient's presenting symptoms, physical exam, and planned diagnostics and interventions. Attending physician stated agreement with plan or made changes to plan which were implemented.    Attending physician assessed patient at bedside.  Discussed with Dr. Lorin Mercy of orthopedics who works with Dr. Sharol Given -- who has done patient's foot surgery.   Dr. Lorin Mercy recommended sling immobilization of one arm she may use the sling for which ever arm is causing more significant pain.  Patient has Roxicodone for pain at home.  She is currently at [facility] and will be able to have assistance with eating and other activities. She will follow-up closely with Dr. Sharol Given  Patient agreeable to plan.  Discharged at this time blood pressures have ranged from 97-113. Mental status quite alert and oriented.   Final Clinical Impression(s) / ED Diagnoses Final diagnoses:  Pathological dislocation of shoulder joint, bilateral    Rx / DC Orders ED Discharge Orders    None       Tedd Sias, [place] 07/21/20 2028    Fredia Sorrow, MD 07/23/20 [phone]

## 2020-07-21 NOTE — Discharge Instructions (Addendum)
Please follow-up with Dr. Sharol Given of orthopedic surgery. In the meantime he may use the arm sling on whichever arm is causing this pain however it seems that this would be the left arm. We will go ahead and put on that arm for now. Please continue use Roxicodone for pain. Drink plenty of water.

## 2020-07-21 NOTE — Progress Notes (Addendum)
Post-Op Visit Note   Patient: Jean Mccl[facility]           Date of Birth: 1946/02/26           MRN: [id number] Visit Date: 07/21/2020 PCP: Leanna Battles, MD  Chief Complaint:  Chief Complaint  Patient presents with  . Left Ankle - Follow-up    HPI:  HPI The patient is a 74 year old woman seen status post left calcaneal fusion on September 15.  She is residing at skilled nursing.  She is nonweightbearing in a cam boot. Resides at skilled nursing.   She complains of heel pain and great toe pain.  States was dropped out of hoyer lift about 3 weeks ago. States did have ED visit at that time. States fractured her tail bone.  Today complaining of new left shoulder pain, unable to move arm. States having numbness and tingling in hand associated. Relates has bothered her since the fall.     Ortho Exam On examination of the left ankle she has mild edema the incision is healing well. there is no gaping no erythema no sign of infection.  On examination of her heel now with ischemic changes. Ischemic changes over the MTP joint medial border. No opening, erythemal or drainage.  Visit Diagnoses:  1. Pain in left ankle and joints of left foot   2. Left shoulder pain, unspecified chronicity     Plan: sutures harvested today without incident. Discussed with her the importance of offloading the heels.  Did provide orders to skilled nursing to float her heels.  Given a PRAFO boot today.  Sent to ED for reduction of L shoulder dislocation.  Prior to EMS arrival patient complaining of right shoulder pain as well. Unclear how long this has been going on. Denies numbness or tingling to RUE. No neck involvement.  Radiographs of right shoulder show dislocation of right shoulder as well.   Send to ED for B shoulder reduction.  Follow-Up Instructions: Return in about 2 weeks (around 08/04/2020).   Imaging: No results found.  Orders:  Orders Placed This Encounter  Procedures  . XR Shoulder  Left  . XR Ankle Complete Left   No orders of the defined types were placed in this encounter.    PMFS History: Patient Active Problem List   Diagnosis Date Noted  . Acute blood loss anemia 06/25/2020  . Acute on chronic respiratory failure with hypoxia and hypercapnia ([place]) 06/25/2020  . Advanced care planning/counseling discussion   . Goals of care, counseling/discussion   . Obstructive sleep apnea   . Primary osteoarthritis, left ankle and foot   . Ankle fracture 06/14/2020  . HLD (hyperlipidemia) 06/14/2020  . Dyspnea, unspecified 02/05/2020  . Intractable pain 01/24/2020  . Bilateral shoulder pain 01/23/2020  . Right knee pain 01/23/2020  . Uncontrolled type 2 diabetes mellitus with hyperglycemia, with long-term current use of insulin ([place]) 01/23/2020  . Pressure injury of skin 01/22/2020  . Mild protein-calorie malnutrition ([place]) 12/16/2019  . Personal history of anaphylaxis 12/05/2019  . Pruritus, unspecified 12/05/2019  . ESRD on hemodialysis ([place]) 11/20/2019  . Myoclonus, segmental 11/20/2019  . Allergy, unspecified, initial encounter 07/29/2019  . Anemia in chronic kidney disease 07/15/2019  . Coagulation defect, unspecified ([place]) 07/13/2019  . Iron deficiency anemia, unspecified 07/11/2019  . Encounter for immunization 07/06/2019  . End stage renal disease ([place]) 07/02/2019  . Gastro-esophageal reflux disease without esophagitis 07/02/2019  . Low back pain 07/02/2019  . BMI 34.0-34.9,adult 07/02/2019  . Other  specified disorders of bone density and structure, unspecified site 07/02/2019  . Secondary hyperparathyroidism of renal origin ([place]) 07/02/2019  . Unspecified osteoarthritis, unspecified site 07/02/2019  . OSA (obstructive sleep apnea) 03/13/2019  . Chronic acquired lymphedema 11/05/2018  . Acute deep vein thrombosis (DVT) of popliteal vein of left lower extremity ([place]) 03/11/2017  . Insulin-requiring or dependent type II diabetes mellitus ([place]) 03/11/2017   . Essential hypertension 03/11/2017  . Impingement syndrome of left ankle 01/31/2017  . Posterior tibial tendinitis, left leg 01/31/2017  . Incarcerated ventral hernia 01/02/2013   Past Medical History:  Diagnosis Date  . Acute and chronic respiratory failure (acute-on-chronic) ([place])   . Anxiety   . Arthritis    osteo  . Chronic kidney disease   . Diabetes mellitus, type 2 (HCC)    Type II  . DVT (deep venous thrombosis) ([place]) 03/2017   left popliteal  . Facet joint disease   . Facet joint disease    foot  . Fibula fracture 02/2017   left  . GERD (gastroesophageal reflux disease)   . H/O blood clots   . Hyperlipemia   . Hyperlipidemia   . Hypertension   . Morbid obesity ([place])   . OSA (obstructive sleep apnea)    does not use CPAP   . Osteopenia   . Renal disorder    MWF dialysis  . Shortness of breath    with exertion  . Venous stasis ulcers ([place])   . Vitamin D deficiency     Family History  Problem Relation Age of Onset  . Leukemia Mother   . Hypertension Brother   . Emphysema Father     Past Surgical History:  Procedure Laterality Date  . ANKLE FUSION Left 06/24/2020   Procedure: LEFT TIBIOCALCANEAL FUSION;  Surgeon: [name], MD;  Location: [facility];  Service: Orthopedics;  Laterality: Left;  . AV FISTULA PLACEMENT Right 08/20/2018   Procedure: ARTERIOVENOUS (AV) FISTULA CREATION BRACHIOCEPHALIC;  Surgeon: Angelia Mould, MD;  Location: [facility];  Service: Vascular;  Laterality: Right;  . BREAST SURGERY Right 03/1997   biospy  . CARPAL TUNNEL RELEASE Left 2016  . INSERTION OF MESH N/A 01/08/2013   Procedure: INSERTION OF MESH;  Surgeon: Earnstine Regal, MD;  [facility];  Service: General;  Laterality: N/A;  . IR THORACENTESIS ASP PLEURAL SPACE W/IMG GUIDE  04/06/2017  . JOINT REPLACEMENT Right 1999  . TOTAL KNEE ARTHROPLASTY Left 1999   Left  . TRIGGER FINGER RELEASE Left 11/2014  . TRIGGER FINGER RELEASE Right   . VENTRAL HERNIA REPAIR N/A  01/08/2013   Procedure: LAPAROSCOPIC VENTRAL HERNIA;  Surgeon: Earnstine Regal, MD;  [facility];  Service: General;  Laterality: N/A;   Social History   Occupational History  . Not on file  Tobacco Use  . Smoking status: Former Smoker    Years: 27.00    Types: Cigarettes    Quit date: 10/10/1981    Years since quitting: 38.8  . Smokeless tobacco: Never Used  Vaping Use  . Vaping Use: Never used  Substance and Sexual Activity  . Alcohol use: No  . Drug use: No  . Sexual activity: Not on file

## 2020-07-22 ENCOUNTER — Other Ambulatory Visit: Payer: Self-pay | Admitting: Family

## 2020-07-22 ENCOUNTER — Telehealth: Payer: Self-pay

## 2020-07-22 DIAGNOSIS — M24312 Pathological dislocation of left shoulder, not elsewhere classified: Secondary | ICD-10-CM

## 2020-07-22 DIAGNOSIS — M24311 Pathological dislocation of right shoulder, not elsewhere classified: Secondary | ICD-10-CM

## 2020-07-22 NOTE — Telephone Encounter (Signed)
Blake Divine, director of nursing at [facility] would like patient's last office note faxed to [phone], attn: Blake Divine.  CB# [phone].  Please advise.  Thank you.

## 2020-07-22 NOTE — Telephone Encounter (Signed)
Faxed per request to skilled facility

## 2020-07-28 ENCOUNTER — Emergency Department (HOSPITAL_COMMUNITY)
Admission: EM | Admit: 2020-07-28 | Discharge: 2020-07-29 | Disposition: A | Discharge status: Home / Self Care | Payer: HMO | Attending: Emergency Medicine | Admitting: Emergency Medicine

## 2020-07-28 ENCOUNTER — Emergency Department (HOSPITAL_COMMUNITY): Payer: HMO

## 2020-07-28 ENCOUNTER — Encounter: Payer: Self-pay | Admitting: Pulmonary Disease

## 2020-07-28 ENCOUNTER — Other Ambulatory Visit: Payer: Self-pay

## 2020-07-28 ENCOUNTER — Ambulatory Visit (INDEPENDENT_AMBULATORY_CARE_PROVIDER_SITE_OTHER): Payer: HMO | Admitting: Pulmonary Disease

## 2020-07-28 VITALS — BP 128/82 | HR 78 | Temp 96.8°F | Ht 60.0 in | Wt 192.0 lb

## 2020-07-28 DIAGNOSIS — R109 Unspecified abdominal pain: Secondary | ICD-10-CM | POA: Diagnosis not present

## 2020-07-28 DIAGNOSIS — Z87891 Personal history of nicotine dependence: Secondary | ICD-10-CM | POA: Diagnosis not present

## 2020-07-28 DIAGNOSIS — Z96652 Presence of left artificial knee joint: Secondary | ICD-10-CM | POA: Insufficient documentation

## 2020-07-28 DIAGNOSIS — E662 Morbid (severe) obesity with alveolar hypoventilation: Secondary | ICD-10-CM | POA: Diagnosis not present

## 2020-07-28 DIAGNOSIS — J9612 Chronic respiratory failure with hypercapnia: Secondary | ICD-10-CM

## 2020-07-28 DIAGNOSIS — E119 Type 2 diabetes mellitus without complications: Secondary | ICD-10-CM | POA: Diagnosis not present

## 2020-07-28 DIAGNOSIS — G4733 Obstructive sleep apnea (adult) (pediatric): Secondary | ICD-10-CM

## 2020-07-28 DIAGNOSIS — I129 Hypertensive chronic kidney disease with stage 1 through stage 4 chronic kidney disease, or unspecified chronic kidney disease: Secondary | ICD-10-CM | POA: Diagnosis not present

## 2020-07-28 DIAGNOSIS — N189 Chronic kidney disease, unspecified: Secondary | ICD-10-CM | POA: Diagnosis not present

## 2020-07-28 DIAGNOSIS — N950 Postmenopausal bleeding: Secondary | ICD-10-CM | POA: Diagnosis not present

## 2020-07-28 DIAGNOSIS — Z96651 Presence of right artificial knee joint: Secondary | ICD-10-CM | POA: Insufficient documentation

## 2020-07-28 DIAGNOSIS — N939 Abnormal uterine and vaginal bleeding, unspecified: Secondary | ICD-10-CM | POA: Diagnosis not present

## 2020-07-28 DIAGNOSIS — D259 Leiomyoma of uterus, unspecified: Secondary | ICD-10-CM | POA: Diagnosis not present

## 2020-07-28 DIAGNOSIS — N281 Cyst of kidney, acquired: Secondary | ICD-10-CM | POA: Diagnosis not present

## 2020-07-28 DIAGNOSIS — N261 Atrophy of kidney (terminal): Secondary | ICD-10-CM | POA: Diagnosis not present

## 2020-07-28 LAB — COMPREHENSIVE METABOLIC PANEL
ALT: 7 U/L (ref 0–44)
AST: 20 U/L (ref 15–41)
Albumin: 2.4 g/dL — ABNORMAL LOW (ref 3.5–5.0)
Alkaline Phosphatase: 127 U/L — ABNORMAL HIGH (ref 38–126)
Anion gap: 11 (ref 5–15)
BUN: 9 mg/dL (ref 8–23)
CO2: 31 mmol/L (ref 22–32)
Calcium: 8.4 mg/dL — ABNORMAL LOW (ref 8.9–10.3)
Chloride: 95 mmol/L — ABNORMAL LOW (ref 98–111)
Creatinine, Ser: 3.52 mg/dL — ABNORMAL HIGH (ref 0.44–1.00)
GFR, Estimated: 12 mL/min — ABNORMAL LOW (ref >60)
Glucose, Bld: 239 mg/dL — ABNORMAL HIGH (ref 70–99)
Potassium: 3.4 mmol/L — ABNORMAL LOW (ref 3.5–5.1)
Sodium: 137 mmol/L (ref 135–145)
Total Bilirubin: 0.4 mg/dL (ref 0.3–1.2)
Total Protein: 5 g/dL — ABNORMAL LOW (ref 6.5–8.1)

## 2020-07-28 LAB — CBC
HCT: 36.1 % (ref 36.0–46.0)
Hemoglobin: 10.1 g/dL — ABNORMAL LOW (ref 12.0–15.0)
MCH: 30.2 pg (ref 26.0–34.0)
MCHC: 28 g/dL — ABNORMAL LOW (ref 30.0–36.0)
MCV: 108.1 fL — ABNORMAL HIGH (ref 80.0–100.0)
Platelets: 229 10*3/uL (ref 150–400)
RBC: 3.34 MIL/uL — ABNORMAL LOW (ref 3.87–5.11)
RDW: 16.5 % — ABNORMAL HIGH (ref 11.5–15.5)
WBC: 9 10*3/uL (ref 4.0–10.5)
nRBC: 0 % (ref 0.0–0.2)

## 2020-07-28 MED ORDER — IOHEXOL 300 MG/ML  SOLN
100.0000 mL | Freq: Once | INTRAMUSCULAR | Status: AC | PRN
  Administered 2020-07-28: 100 mL via INTRAVENOUS

## 2020-07-28 NOTE — ED Notes (Signed)
Patient transported to CT 

## 2020-07-28 NOTE — Patient Instructions (Signed)
Transitioning to Trilogy ventilator use on 25th October  Issues noted in the past revolves around mask fit and leak  Needs an appropriately fitted mask to tolerate treatment and for treatment to be effective  I will see you about 4 weeks after initiation of ventilator use at night  Call with significant concerns

## 2020-07-28 NOTE — Discharge Instructions (Addendum)
You were evaluated in the Emergency Department and after careful evaluation, we did not find any emergent condition requiring admission or further testing in the hospital.  Your symptoms seem to be due to an abnormality or possibly a mass on your uterus/cervix.  This requires close follow-up with a GYN specialist.  Please call the gynecology oncology office number provided to schedule an appointment.  Please return to the Emergency Department if you experience any worsening of your condition.   Thank you for allowing [place] to be a part of your care.

## 2020-07-28 NOTE — ED Provider Notes (Signed)
[facility] Emergency Department Provider Note MRN:  [id number]  Arrival date & time: 07/28/20     Chief Complaint   Vaginal Bleeding   History of Present Illness   Jean Mcclain is a 74 y.o. year-old female with a history of diabetes, DVT presenting to the ED with chief complaint of vaginal bleeding.  Noticed some blood from the vagina today, has not had bleeding from the vagina in many years.  Endorsing intermittent abdominal pain for the past several weeks, endorsing unintentional weight loss.  Denies night sweats, no fever, no nausea, no vomiting, no diarrhea, no chest pain, no shortness of breath.  Review of Systems  A complete 10 system review of systems was obtained and all systems are negative except as noted in the HPI and PMH.   Patient's Health History    Past Medical History:  Diagnosis Date  . Acute and chronic respiratory failure (acute-on-chronic) ([place])   . Anxiety   . Arthritis    osteo  . Chronic kidney disease   . Diabetes mellitus, type 2 (HCC)    Type II  . DVT (deep venous thrombosis) ([place]) 03/2017   left popliteal  . Facet joint disease   . Facet joint disease    foot  . Fibula fracture 02/2017   left  . GERD (gastroesophageal reflux disease)   . H/O blood clots   . Hyperlipemia   . Hyperlipidemia   . Hypertension   . Morbid obesity ([place])   . OSA (obstructive sleep apnea)    does not use CPAP   . Osteopenia   . Renal disorder    MWF dialysis  . Shortness of breath    with exertion  . Venous stasis ulcers ([place])   . Vitamin D deficiency     Past Surgical History:  Procedure Laterality Date  . ANKLE FUSION Left 06/24/2020   Procedure: LEFT TIBIOCALCANEAL FUSION;  Surgeon: [name], MD;  Location: [place];  Service: Orthopedics;  Laterality: Left;  . AV FISTULA PLACEMENT Right 08/20/2018   Procedure: ARTERIOVENOUS (AV) FISTULA CREATION BRACHIOCEPHALIC;  Surgeon: Angelia Mould, MD;  Location: [place];   Service: Vascular;  Laterality: Right;  . BREAST SURGERY Right 03/1997   biospy  . CARPAL TUNNEL RELEASE Left 2016  . INSERTION OF MESH N/A 01/08/2013   Procedure: INSERTION OF MESH;  Surgeon: Earnstine Regal, MD;  [facility];  Service: General;  Laterality: N/A;  . IR THORACENTESIS ASP PLEURAL SPACE W/IMG GUIDE  04/06/2017  . JOINT REPLACEMENT Right 1999  . TOTAL KNEE ARTHROPLASTY Left 1999   Left  . TRIGGER FINGER RELEASE Left 11/2014  . TRIGGER FINGER RELEASE Right   . VENTRAL HERNIA REPAIR N/A 01/08/2013   Procedure: LAPAROSCOPIC VENTRAL HERNIA;  Surgeon: Earnstine Regal, MD;  [facility];  Service: General;  Laterality: N/A;    Family History  Problem Relation Age of Onset  . Leukemia Mother   . Hypertension Brother   . Emphysema Father     Social History   Socioeconomic History  . Marital status: Single    Spouse name: Not on file  . Number of children: Not on file  . Years of education: Not on file  . Highest education level: Not on file  Occupational History  . Not on file  Tobacco Use  . Smoking status: Former Smoker    Years: 27.00    Types: Cigarettes    Quit date: 10/10/1981  Years since quitting: 38.8  . Smokeless tobacco: Never Used  Vaping Use  . Vaping Use: Never used  Substance and Sexual Activity  . Alcohol use: No  . Drug use: No  . Sexual activity: Not on file  Other Topics Concern  . Not on file  Social History Narrative   ** Merged History Encounter **       Social Determinants of Health   Financial Resource Strain:   . Difficulty of Paying Living Expenses: Not on file  Food Insecurity: No Food Insecurity  . Worried About Charity fundraiser in the Last Year: Never true  . Ran Out of Food in the Last Year: Never true  Transportation Needs: No Transportation Needs  . Lack of Transportation (Medical): No  . Lack of Transportation (Non-Medical): No  Physical Activity:   . Days of Exercise per Week: Not on file  . Minutes of Exercise  per Session: Not on file  Stress:   . Feeling of Stress : Not on file  Social Connections:   . Frequency of Communication with Friends and Family: Not on file  . Frequency of Social Gatherings with Friends and Family: Not on file  . Attends Religious Services: Not on file  . Active Member of Clubs or Organizations: Not on file  . Attends Archivist Meetings: Not on file  . Marital Status: Not on file  Intimate Partner Violence:   . Fear of Current or Ex-Partner: Not on file  . Emotionally Abused: Not on file  . Physically Abused: Not on file  . Sexually Abused: Not on file     Physical Exam   Vitals:   07/28/20 2130 07/28/20 2311  BP: (!) 113/33   Pulse: 76 74  Resp: 18 17  Temp:    SpO2: 100% 100%    CONSTITUTIONAL: Chronically ill-appearing, NAD NEURO:  Alert and oriented x 3, no focal deficits EYES:  eyes equal and reactive ENT/NECK:  no LAD, no JVD CARDIO: Regular rate, well-perfused, normal S1 and S2 PULM:  CTAB no wheezing or rhonchi GI/GU:  normal bowel sounds, non-distended, non-tender; small amount of dark red blood in the vaginal vault discovered upon bimanual exam, no significant adnexal tenderness or masses MSK/SPINE:  No gross deformities, no edema SKIN:  no rash, atraumatic PSYCH:  Appropriate speech and behavior  *Additional and/or pertinent findings included in MDM below  Diagnostic and Interventional Summary    EKG Interpretation  Date/Time:    Ventricular Rate:    PR Interval:    QRS Duration:   QT Interval:    QTC Calculation:   R Axis:     Text Interpretation:        Labs Reviewed  CBC - Abnormal; Notable for the following components:      Result Value   RBC 3.34 (*)    Hemoglobin 10.1 (*)    MCV 108.1 (*)    MCHC 28.0 (*)    RDW 16.5 (*)    All other components within normal limits  COMPREHENSIVE METABOLIC PANEL - Abnormal; Notable for the following components:   Potassium 3.4 (*)    Chloride 95 (*)    Glucose, Bld 239  (*)    Creatinine, Ser 3.52 (*)    Calcium 8.4 (*)    Total Protein 5.0 (*)    Albumin 2.4 (*)    Alkaline Phosphatase 127 (*)    GFR, Estimated 12 (*)    All other components within normal limits  CT ABDOMEN PELVIS W CONTRAST  Final Result      Medications  iohexol (OMNIPAQUE) 300 MG/ML solution 100 mL (100 mLs Intravenous Contrast Given 07/28/20 2142)     Procedures  /  Critical Care Procedures  ED Course and Medical Decision Making  I have reviewed the triage vital signs, the nursing notes, and pertinent available records from the EMR.  Listed above are laboratory and imaging tests that I personally ordered, reviewed, and interpreted and then considered in my medical decision making (see below for details).  Postmenopausal bleeding with intermittent abdominal pain, unintentional weight loss, will CT to eval for GYN neoplasm.     CT revealing hypodense cervical mass versus area of blood products, patient continues to feel generally comfortable with normal vital signs, she is appropriate for discharge with GYN follow-up.   Barth Kirks. [place] Small, [place] [email or web address]  Final Clinical Impressions(s) / ED Diagnoses     ICD-10-CM   1. Post-menopausal bleeding  N95.0     ED Discharge Orders    None       Discharge Instructions Discussed with and Provided to Patient:     Discharge Instructions     You were evaluated in the Emergency Department and after careful evaluation, we did not find any emergent condition requiring admission or further testing in the hospital.  Your symptoms seem to be due to an abnormality or possibly a mass on your uterus/cervix.  This requires close follow-up with a GYN specialist.  Please call the gynecology oncology office number provided to schedule an appointment.  Please return to the Emergency Department if you experience any worsening of your condition.   Thank you for allowing  [place] to be a part of your care.       Maudie Flakes, MD 07/28/20 2314

## 2020-07-28 NOTE — Progress Notes (Signed)
Jean Mcclain    [id number]    Jun 06, 1946  Primary Care Physician:Paterson, Quillian Quince, MD  Referring Physician: Leanna Battles, MD [address] [place],  [place] [address]  Chief complaint:   Patient with shortness of breath Obstructive sleep apnea  HPI:  patient has obstructive sleep apnea Was on CPAP in the past Did not tolerated because she had a lot of mask leak  Was recently hospitalized for hypercapnic respiratory failure  Was on BiPAP 16/8 with oxygen piped into the system  Has multiple chronic comorbidities including diabetes, hypertension, hyperlipidemia, end-stage renal disease  Outpatient Encounter Medications as of 07/28/2020  Medication Sig  . acetaminophen (TYLENOL) 500 MG tablet Take 1 tablet (500 mg total) by mouth every 6 (six) hours as needed for mild pain or headache.  . albuterol (VENTOLIN HFA) 108 (90 Base) MCG/ACT inhaler Inhale 2 puffs into the lungs every 6 (six) hours as needed for wheezing or shortness of breath.   Marland Kitchen apixaban (ELIQUIS) 2.5 MG TABS tablet Take 2.5 mg by mouth 2 (two) times daily.  . B-D INS SYR ULTRAFINE 1CC/30G 30G X 1/2" 1 ML MISC 1 each by Other route daily.   . Blood Glucose Monitoring Suppl (ONETOUCH VERIO FLEX SYSTEM) w/Device KIT   . calcium acetate (PHOSLO) 667 MG tablet Take 667 mg by mouth in the morning, at noon, in the evening, and at bedtime.   . carbidopa-levodopa (SINEMET IR) 10-100 MG tablet Take 1 tablet by mouth 2 (two) times daily.  . colchicine 0.6 MG tablet Take 0.3 mg by mouth daily.  Marland Kitchen doxercalciferol (HECTOROL) 4 MCG/2ML injection Inject 1 mL (2 mcg total) into the vein every Monday, Wednesday, and Friday with hemodialysis.  . febuxostat (ULORIC) 40 MG tablet Take 40 mg by mouth daily.  Marland Kitchen gabapentin (NEURONTIN) 100 MG capsule Take 1 capsule (100 mg total) by mouth at bedtime.  Marland Kitchen glucose blood test strip 1 each by Other route daily.   . insulin NPH Human (NOVOLIN N) 100 UNIT/ML injection Inject 0.1  mLs (10 Units total) into the skin 2 (two) times daily at 8 am and 10 pm. (Patient taking differently: Inject 10 Units into the skin 2 (two) times daily before a meal. )  . Lancets (ONETOUCH DELICA PLUS [id number]) MISC 1 each by Other route daily.   . Multiple Vitamin (MULTIVITAMIN WITH MINERALS) TABS tablet Take 1 tablet by mouth daily.  Marland Kitchen omeprazole (PRILOSEC) 20 MG capsule Take 20 mg by mouth daily before breakfast.   . oxyCODONE (OXY IR/ROXICODONE) 5 MG immediate release tablet Take 1 tablet (5 mg total) by mouth every 6 (six) hours as needed for moderate pain or severe pain.  . OXYGEN Inhale 2 L into the lungs as needed (shortness of breath).  Marland Kitchen rOPINIRole (REQUIP) 4 MG tablet Take 4 mg by mouth 2 (two) times daily.  . Zinc Oxide (TRIPLE PASTE) 12.8 % ointment Apply topically 4 (four) times daily. (Patient taking differently: Apply 1 application topically 4 (four) times daily. )   No facility-administered encounter medications on file as of 07/28/2020.    Allergies as of 07/28/2020  . (No Known Allergies)    Past Medical History:  Diagnosis Date  . Acute and chronic respiratory failure (acute-on-chronic) ([place])   . Anxiety   . Arthritis    osteo  . Chronic kidney disease   . Diabetes mellitus, type 2 (HCC)    Type II  . DVT (deep venous thrombosis) ([place]) 03/2017  left popliteal  . Facet joint disease   . Facet joint disease    foot  . Fibula fracture 02/2017   left  . GERD (gastroesophageal reflux disease)   . H/O blood clots   . Hyperlipemia   . Hyperlipidemia   . Hypertension   . Morbid obesity ([place])   . OSA (obstructive sleep apnea)    does not use CPAP   . Osteopenia   . Renal disorder    MWF dialysis  . Shortness of breath    with exertion  . Venous stasis ulcers ([place])   . Vitamin D deficiency     Past Surgical History:  Procedure Laterality Date  . ANKLE FUSION Left 06/24/2020   Procedure: LEFT TIBIOCALCANEAL FUSION;  Surgeon: [name], MD;   Location: [place];  Service: Orthopedics;  Laterality: Left;  . AV FISTULA PLACEMENT Right 08/20/2018   Procedure: ARTERIOVENOUS (AV) FISTULA CREATION BRACHIOCEPHALIC;  Surgeon: Angelia Mould, MD;  Location: [place];  Service: Vascular;  Laterality: Right;  . BREAST SURGERY Right 03/1997   biospy  . CARPAL TUNNEL RELEASE Left 2016  . INSERTION OF MESH N/A 01/08/2013   Procedure: INSERTION OF MESH;  Surgeon: Earnstine Regal, MD;  [facility];  Service: General;  Laterality: N/A;  . IR THORACENTESIS ASP PLEURAL SPACE W/IMG GUIDE  04/06/2017  . JOINT REPLACEMENT Right 1999  . TOTAL KNEE ARTHROPLASTY Left 1999   Left  . TRIGGER FINGER RELEASE Left 11/2014  . TRIGGER FINGER RELEASE Right   . VENTRAL HERNIA REPAIR N/A 01/08/2013   Procedure: LAPAROSCOPIC VENTRAL HERNIA;  Surgeon: Earnstine Regal, MD;  [facility];  Service: General;  Laterality: N/A;    Family History  Problem Relation Age of Onset  . Leukemia Mother   . Hypertension Brother   . Emphysema Father     Social History   Socioeconomic History  . Marital status: Single    Spouse name: Not on file  . Number of children: Not on file  . Years of education: Not on file  . Highest education level: Not on file  Occupational History  . Not on file  Tobacco Use  . Smoking status: Former Smoker    Years: 27.00    Types: Cigarettes    Quit date: 10/10/1981    Years since quitting: 38.8  . Smokeless tobacco: Never Used  Vaping Use  . Vaping Use: Never used  Substance and Sexual Activity  . Alcohol use: No  . Drug use: No  . Sexual activity: Not on file  Other Topics Concern  . Not on file  Social History Narrative   ** Merged History Encounter **       Social Determinants of Health   Financial Resource Strain:   . Difficulty of Paying Living Expenses: Not on file  Food Insecurity: No Food Insecurity  . Worried About Charity fundraiser in the Last Year: Never true  . Ran Out of Food in the Last Year: Never  true  Transportation Needs: No Transportation Needs  . Lack of Transportation (Medical): No  . Lack of Transportation (Non-Medical): No  Physical Activity:   . Days of Exercise per Week: Not on file  . Minutes of Exercise per Session: Not on file  Stress:   . Feeling of Stress : Not on file  Social Connections:   . Frequency of Communication with Friends and Family: Not on file  . Frequency of Social Gatherings with Friends and  Family: Not on file  . Attends Religious Services: Not on file  . Active Member of Clubs or Organizations: Not on file  . Attends Archivist Meetings: Not on file  . Marital Status: Not on file  Intimate Partner Violence:   . Fear of Current or Ex-Partner: Not on file  . Emotionally Abused: Not on file  . Physically Abused: Not on file  . Sexually Abused: Not on file    Review of Systems  Respiratory: Positive for apnea and shortness of breath.   Psychiatric/Behavioral: Positive for sleep disturbance.    Vitals:   07/28/20 1455  BP: 128/82  Pulse: 78  Temp: (!) 96.8 F (36 C)  SpO2: 99%     Physical Exam Constitutional:      Appearance: She is obese.  HENT:     Mouth/Throat:     Mouth: Mucous membranes are moist.  Eyes:     General:        Right eye: Discharge present.        Left eye: No discharge.  Cardiovascular:     Rate and Rhythm: Normal rate and regular rhythm.     Heart sounds: No murmur heard.  No friction rub.  Pulmonary:     Effort: No respiratory distress.     Breath sounds: No stridor. No wheezing or rhonchi.     Comments: Poor air movement bilaterally Musculoskeletal:     Cervical back: No rigidity or tenderness.  Neurological:     Mental Status: She is alert.  Psychiatric:        Mood and Affect: Mood normal.    Data Reviewed: Sleep study from January 2020 reviewed by myself  Recent office visit reviewed, full sleep study reviewed prior to visit Recent radiological data reviewed prior to  visit  Assessment:  Obstructive sleep apnea Obesity hypoventilation syndrome Hypercapnic respiratory failure Chronic shortness of breath  Patient will be transitioned to a trilogy ventilator on the 25th October  Importance of compliance with ventilator use discussed with the patient  Risks with not treating sleep disordered breathing and hypercapnic failure discussed with the patient   Plan/Recommendations: Trilogy ventilator-  Follow-up in 4 weeks following initiation of treatment  Weight loss measures encouraged  Continue bronchodilator treatments  Significant limitation with previous treatment was not finding the right mask, mask was not fitted well enough that she was having a lot of leaks which kept her up at night and was not able to tolerate treatment  Sherrilyn Rist MD [facility] 07/28/2020, 3:15 PM  CC: Leanna Battles, MD

## 2020-07-28 NOTE — ED Triage Notes (Addendum)
Pt BIB [facility] from [facility] c/o vaginal bleeding.   Pt had been seen recently d/t falls. Denies pain.

## 2020-07-29 ENCOUNTER — Telehealth: Payer: Self-pay | Admitting: Pulmonary Disease

## 2020-07-29 DIAGNOSIS — M6281 Muscle weakness (generalized): Secondary | ICD-10-CM | POA: Diagnosis not present

## 2020-07-29 DIAGNOSIS — S82422D Displaced transverse fracture of shaft of left fibula, subsequent encounter for closed fracture with routine healing: Secondary | ICD-10-CM | POA: Diagnosis not present

## 2020-07-29 DIAGNOSIS — S79929A Unspecified injury of unspecified thigh, initial encounter: Secondary | ICD-10-CM | POA: Diagnosis not present

## 2020-07-29 DIAGNOSIS — Z1151 Encounter for screening for human papillomavirus (HPV): Secondary | ICD-10-CM | POA: Diagnosis not present

## 2020-07-29 DIAGNOSIS — Z4789 Encounter for other orthopedic aftercare: Secondary | ICD-10-CM | POA: Diagnosis not present

## 2020-07-29 DIAGNOSIS — E1169 Type 2 diabetes mellitus with other specified complication: Secondary | ICD-10-CM | POA: Diagnosis not present

## 2020-07-29 DIAGNOSIS — J9621 Acute and chronic respiratory failure with hypoxia: Secondary | ICD-10-CM | POA: Diagnosis not present

## 2020-07-29 DIAGNOSIS — L989 Disorder of the skin and subcutaneous tissue, unspecified: Secondary | ICD-10-CM | POA: Diagnosis not present

## 2020-07-29 DIAGNOSIS — E876 Hypokalemia: Secondary | ICD-10-CM | POA: Diagnosis not present

## 2020-07-29 DIAGNOSIS — R633 Feeding difficulties, unspecified: Secondary | ICD-10-CM | POA: Diagnosis not present

## 2020-07-29 DIAGNOSIS — I5033 Acute on chronic diastolic (congestive) heart failure: Secondary | ICD-10-CM | POA: Diagnosis not present

## 2020-07-29 DIAGNOSIS — S322XXD Fracture of coccyx, subsequent encounter for fracture with routine healing: Secondary | ICD-10-CM | POA: Diagnosis not present

## 2020-07-29 DIAGNOSIS — Z7401 Bed confinement status: Secondary | ICD-10-CM | POA: Diagnosis not present

## 2020-07-29 DIAGNOSIS — D509 Iron deficiency anemia, unspecified: Secondary | ICD-10-CM | POA: Diagnosis not present

## 2020-07-29 DIAGNOSIS — I959 Hypotension, unspecified: Secondary | ICD-10-CM | POA: Diagnosis not present

## 2020-07-29 DIAGNOSIS — R278 Other lack of coordination: Secondary | ICD-10-CM | POA: Diagnosis not present

## 2020-07-29 DIAGNOSIS — N186 End stage renal disease: Secondary | ICD-10-CM | POA: Diagnosis not present

## 2020-07-29 DIAGNOSIS — E1022 Type 1 diabetes mellitus with diabetic chronic kidney disease: Secondary | ICD-10-CM | POA: Diagnosis not present

## 2020-07-29 DIAGNOSIS — Z992 Dependence on renal dialysis: Secondary | ICD-10-CM | POA: Diagnosis not present

## 2020-07-29 DIAGNOSIS — L89312 Pressure ulcer of right buttock, stage 2: Secondary | ICD-10-CM | POA: Diagnosis not present

## 2020-07-29 DIAGNOSIS — Z743 Need for continuous supervision: Secondary | ICD-10-CM | POA: Diagnosis not present

## 2020-07-29 DIAGNOSIS — E1122 Type 2 diabetes mellitus with diabetic chronic kidney disease: Secondary | ICD-10-CM | POA: Diagnosis not present

## 2020-07-29 DIAGNOSIS — L89321 Pressure ulcer of left buttock, stage 1: Secondary | ICD-10-CM | POA: Diagnosis not present

## 2020-07-29 DIAGNOSIS — Z01419 Encounter for gynecological examination (general) (routine) without abnormal findings: Secondary | ICD-10-CM | POA: Diagnosis not present

## 2020-07-29 DIAGNOSIS — R2681 Unsteadiness on feet: Secondary | ICD-10-CM | POA: Diagnosis not present

## 2020-07-29 DIAGNOSIS — G4733 Obstructive sleep apnea (adult) (pediatric): Secondary | ICD-10-CM

## 2020-07-29 DIAGNOSIS — J9602 Acute respiratory failure with hypercapnia: Secondary | ICD-10-CM | POA: Diagnosis not present

## 2020-07-29 DIAGNOSIS — Z23 Encounter for immunization: Secondary | ICD-10-CM | POA: Diagnosis not present

## 2020-07-29 DIAGNOSIS — R58 Hemorrhage, not elsewhere classified: Secondary | ICD-10-CM | POA: Diagnosis not present

## 2020-07-29 DIAGNOSIS — M19072 Primary osteoarthritis, left ankle and foot: Secondary | ICD-10-CM | POA: Diagnosis not present

## 2020-07-29 DIAGNOSIS — S91302D Unspecified open wound, left foot, subsequent encounter: Secondary | ICD-10-CM | POA: Diagnosis not present

## 2020-07-29 DIAGNOSIS — M2550 Pain in unspecified joint: Secondary | ICD-10-CM | POA: Diagnosis not present

## 2020-07-29 DIAGNOSIS — L97521 Non-pressure chronic ulcer of other part of left foot limited to breakdown of skin: Secondary | ICD-10-CM | POA: Diagnosis not present

## 2020-07-29 DIAGNOSIS — L89623 Pressure ulcer of left heel, stage 3: Secondary | ICD-10-CM | POA: Diagnosis not present

## 2020-07-29 DIAGNOSIS — R5381 Other malaise: Secondary | ICD-10-CM | POA: Diagnosis not present

## 2020-07-29 DIAGNOSIS — E785 Hyperlipidemia, unspecified: Secondary | ICD-10-CM | POA: Diagnosis not present

## 2020-07-29 DIAGNOSIS — E669 Obesity, unspecified: Secondary | ICD-10-CM | POA: Diagnosis not present

## 2020-07-29 DIAGNOSIS — N950 Postmenopausal bleeding: Secondary | ICD-10-CM | POA: Diagnosis present

## 2020-07-29 DIAGNOSIS — E119 Type 2 diabetes mellitus without complications: Secondary | ICD-10-CM | POA: Diagnosis not present

## 2020-07-29 DIAGNOSIS — N939 Abnormal uterine and vaginal bleeding, unspecified: Secondary | ICD-10-CM | POA: Diagnosis not present

## 2020-07-29 NOTE — ED Notes (Signed)
Discharge instructions discussed with pt. Pt verbalized understanding with no questions at this time. Pt to wait for [facility] in hallway.

## 2020-07-29 NOTE — Telephone Encounter (Signed)
ATC Bridgette with [facility]. There was no answer and no option to leave a message. Will try back.

## 2020-07-29 NOTE — ED Notes (Signed)
Called [facility] for transport to [facility]--Rozelia Catapano

## 2020-07-29 NOTE — ED Notes (Signed)
Pt transported to facility via [facility] at this time.

## 2020-07-30 DIAGNOSIS — Z992 Dependence on renal dialysis: Secondary | ICD-10-CM | POA: Diagnosis not present

## 2020-07-30 DIAGNOSIS — E785 Hyperlipidemia, unspecified: Secondary | ICD-10-CM | POA: Diagnosis not present

## 2020-07-30 DIAGNOSIS — N186 End stage renal disease: Secondary | ICD-10-CM | POA: Diagnosis not present

## 2020-07-30 DIAGNOSIS — N939 Abnormal uterine and vaginal bleeding, unspecified: Secondary | ICD-10-CM | POA: Diagnosis not present

## 2020-07-30 DIAGNOSIS — S322XXD Fracture of coccyx, subsequent encounter for fracture with routine healing: Secondary | ICD-10-CM | POA: Diagnosis not present

## 2020-07-30 NOTE — Telephone Encounter (Signed)
Called and spoke to [facility] who states that the facility this patient lives in does not allow Trilogy machine per [employer]. Patient will need an alternative or she said she would have to find a different facility for the patient that does allow it. She advised that the patient has expressed she does not want to leave where she is now.    [facility]  [phone]  Melodye Ped A 1 hour ago (1:37 PM)  BG Bridgette with [facility] stating that it is facility/company policy to not use Trelegy - need to know what else to do or does she need to find somewhere else to live      Dr. Ander Slade and Aaron Edelman please advise

## 2020-07-30 NOTE — Telephone Encounter (Signed)
07/30/2020  We will await response from Dr. Jenetta Downer who last saw the patient.  I believe from what I can remember the patient was intolerant of CPAP as well as BiPAP.  She is high risk for hypercarbic respiratory failure, just recently hospitalized.  Ultimately though the patient does not want to change living situations that she may have to stop trilogy ventilator use and this will put her at high risk of readmission to the hospital.  We will await input from Dr. Nat Math, FNP

## 2020-07-31 NOTE — Telephone Encounter (Signed)
Attempted to contact Jean Mcclain with [facility] to make her aware that we are currently awaiting Dr. Judson Roch response. I was placed on hold for >71min.

## 2020-08-03 NOTE — Telephone Encounter (Signed)
BiPAP 16/8 was the last treatment  Recommend BiPAP 16/8 with a backup rate of 12 Mask refitting will be essential as patient was having a lot of mask leaks  Unfortunately, if she is not able to tolerate the BiPAP then trilogy ventilator will be the only thing that keeps safe which will mean having to move -We do not have any other tools apart from the above

## 2020-08-04 ENCOUNTER — Telehealth: Payer: Self-pay | Admitting: *Deleted

## 2020-08-04 DIAGNOSIS — N939 Abnormal uterine and vaginal bleeding, unspecified: Secondary | ICD-10-CM | POA: Diagnosis not present

## 2020-08-04 NOTE — Telephone Encounter (Signed)
Spoke with Bridgette and notified of the recommendations per Dr Jenetta Downer  She verbalized understanding and I have sent order to [facility]

## 2020-08-04 NOTE — Telephone Encounter (Signed)
lmtcb for Bridgette.

## 2020-08-04 NOTE — Telephone Encounter (Signed)
Called and left a  Message for the DON at [facility] to call the office back. Patient needs to be seen at [facility] for [facility] [phone]) and not our office

## 2020-08-04 NOTE — Telephone Encounter (Signed)
Bridgette is returning call to [facility] and can be reached @ [phone].Hillery Hunter

## 2020-08-05 ENCOUNTER — Other Ambulatory Visit: Payer: Self-pay

## 2020-08-05 NOTE — Patient Outreach (Signed)
  [facility]) Care Management Chronic Special Needs Program   08/05/2020  Name: Jean Mcclain, DOB: [birth year]  MRN: [id number]  The client was discussed in today's interdisciplinary care team meeting.  The following issues were discussed:  Client's needs, Changes in health status, Care Plan, Coordination of care, Care transitions and Issues/barriers to care  Participants present:   Bary Castilla, BSN, MS, CCM   Dr. Hollice Espy, BSN, CCM   Peter Garter, BSN, CCM, CDE   Thea Silversmith, BSN, MSN, CCM   Babs Bertin, RN-[facility]   Roney Mans, CDE, Health Coach HTA   Laurene Footman, RN, BSN, HTA   Eula Listen, RN, BSN, CCM, HTA   Karrie Meres, Pharm D, HTA   Janey Genta, RN, BSN, HTA   Emeline Gins, RN, BSN, MS-[facility]   Dr. Coralie Carpen, HTA   Recommendations:  Plan to continue to monitor client's progress at skilled nursing facility and issues with obstructive sleep apnea.  [facility] will follow post discharge from skilled nursing facility  Plan:  Plan to follow up on client's progress and follow up per tier level   [place], Jackquline Denmark, [facility] [phone]

## 2020-08-09 DIAGNOSIS — N186 End stage renal disease: Secondary | ICD-10-CM | POA: Diagnosis not present

## 2020-08-09 DIAGNOSIS — E1022 Type 1 diabetes mellitus with diabetic chronic kidney disease: Secondary | ICD-10-CM | POA: Diagnosis not present

## 2020-08-09 DIAGNOSIS — Z992 Dependence on renal dialysis: Secondary | ICD-10-CM | POA: Diagnosis not present

## 2020-08-10 DIAGNOSIS — E1169 Type 2 diabetes mellitus with other specified complication: Secondary | ICD-10-CM | POA: Diagnosis not present

## 2020-08-10 DIAGNOSIS — N186 End stage renal disease: Secondary | ICD-10-CM | POA: Diagnosis not present

## 2020-08-10 DIAGNOSIS — S322XXD Fracture of coccyx, subsequent encounter for fracture with routine healing: Secondary | ICD-10-CM | POA: Diagnosis not present

## 2020-08-10 DIAGNOSIS — Z992 Dependence on renal dialysis: Secondary | ICD-10-CM | POA: Diagnosis not present

## 2020-08-10 DIAGNOSIS — E785 Hyperlipidemia, unspecified: Secondary | ICD-10-CM | POA: Diagnosis not present

## 2020-08-10 DIAGNOSIS — Z23 Encounter for immunization: Secondary | ICD-10-CM | POA: Diagnosis not present

## 2020-08-10 DIAGNOSIS — D509 Iron deficiency anemia, unspecified: Secondary | ICD-10-CM | POA: Diagnosis not present

## 2020-08-10 DIAGNOSIS — S91302D Unspecified open wound, left foot, subsequent encounter: Secondary | ICD-10-CM | POA: Diagnosis not present

## 2020-08-11 ENCOUNTER — Telehealth: Payer: Self-pay

## 2020-08-11 NOTE — Telephone Encounter (Signed)
Told Jean Mcclain that the physicians reviewed her epic chart and she needs to see a regular gyn.  [name] the phone number to  [facility] [phone]

## 2020-08-13 ENCOUNTER — Other Ambulatory Visit: Payer: Self-pay

## 2020-08-13 NOTE — Patient Outreach (Signed)
  [facility]) Care Management Chronic Special Needs Program  08/13/2020  Name: Jean Mcclain DOB: 1945-12-23  MRN: [id number]  Ms. Jean Mcclain is enrolled in a chronic special needs plan for Diabetes. Client called with no answer No answer and HIPAA compliant message left. 1st attempt Plan for 2nd outreach call in one week Chronic care management coordinator will attempt outreach in one week.   Peter Garter RN, Jackquline [place], CDE Chronic Care Management Coordinator [facility] [phone]

## 2020-08-19 ENCOUNTER — Other Ambulatory Visit: Payer: Self-pay

## 2020-08-19 NOTE — Patient Outreach (Signed)
  [facility]) Care Management Chronic Special Needs Program  08/19/2020  Name: Jean Mcclain DOB: 1946-02-20  MRN: [id number]  Ms. Perl Folmar is enrolled in a chronic special needs plan for Diabetes. Client called with no answer No answer and HIPAA compliant message left. 2nd attempt Plan for 3rd outreach call in one week Chronic care management coordinator will attempt outreach in one week.   Peter Garter RN, Jackquline Denmark, CDE Chronic Care Management Coordinator [facility] [phone]

## 2020-08-20 ENCOUNTER — Other Ambulatory Visit (HOSPITAL_COMMUNITY)
Admission: RE | Admit: 2020-08-20 | Discharge: 2020-08-20 | Disposition: A | Discharge status: Home / Self Care | Payer: HMO | Source: Ambulatory Visit | Attending: Obstetrics and Gynecology | Admitting: Obstetrics and Gynecology

## 2020-08-20 ENCOUNTER — Ambulatory Visit (INDEPENDENT_AMBULATORY_CARE_PROVIDER_SITE_OTHER): Payer: HMO | Admitting: Obstetrics and Gynecology

## 2020-08-20 ENCOUNTER — Encounter: Payer: Self-pay | Admitting: Obstetrics and Gynecology

## 2020-08-20 ENCOUNTER — Other Ambulatory Visit: Payer: Self-pay

## 2020-08-20 VITALS — BP 127/66 | HR 78 | Wt 196.0 lb

## 2020-08-20 DIAGNOSIS — N858 Other specified noninflammatory disorders of uterus: Secondary | ICD-10-CM | POA: Diagnosis not present

## 2020-08-20 DIAGNOSIS — N950 Postmenopausal bleeding: Secondary | ICD-10-CM | POA: Diagnosis not present

## 2020-08-20 DIAGNOSIS — Z1151 Encounter for screening for human papillomavirus (HPV): Secondary | ICD-10-CM | POA: Diagnosis not present

## 2020-08-20 DIAGNOSIS — Z01419 Encounter for gynecological examination (general) (routine) without abnormal findings: Secondary | ICD-10-CM | POA: Diagnosis not present

## 2020-08-20 MED ORDER — ACETAMINOPHEN 500 MG PO TABS
1000.0000 mg | ORAL_TABLET | Freq: Once | ORAL | Status: AC
  Administered 2020-08-20: 1000 mg via ORAL

## 2020-08-20 NOTE — Progress Notes (Signed)
74 yo P0 postmenopausal for over 20 years and BMI 38 presenting today for the evaluation of a 93-month history of vaginal bleeding. Patient describes the pain as intermittent and alternating between light bleeding and heavier bleeding with passage of clots. Patient also reports some occasional abdominal pain. Patient is not sexually active. She is currently in a skilled nursing facility. Patient reports a normal mammogram last year with [facility]. She states that she has never had a gynecologic exam and cannot recall whether or not she has had a pap smear  Past Medical History:  Diagnosis Date  . Acute and chronic respiratory failure (acute-on-chronic) ([place])   . Anxiety   . Arthritis    osteo  . Chronic kidney disease   . Diabetes mellitus, type 2 (HCC)    Type II  . DVT (deep venous thrombosis) ([place]) 03/2017   left popliteal  . Facet joint disease   . Facet joint disease    foot  . Fibula fracture 02/2017   left  . GERD (gastroesophageal reflux disease)   . H/O blood clots   . Hyperlipemia   . Hyperlipidemia   . Hypertension   . Morbid obesity ([place])   . OSA (obstructive sleep apnea)    does not use CPAP   . Osteopenia   . Renal disorder    MWF dialysis  . Shortness of breath    with exertion  . Venous stasis ulcers ([place])   . Vitamin D deficiency    Past Surgical History:  Procedure Laterality Date  . ANKLE FUSION Left 06/24/2020   Procedure: LEFT TIBIOCALCANEAL FUSION;  Surgeon: [name], MD;  Location: [place];  Service: Orthopedics;  Laterality: Left;  . AV FISTULA PLACEMENT Right 08/20/2018   Procedure: ARTERIOVENOUS (AV) FISTULA CREATION BRACHIOCEPHALIC;  Surgeon: Angelia Mould, MD;  Location: [facility];  Service: Vascular;  Laterality: Right;  . BREAST SURGERY Right 03/1997   biospy  . CARPAL TUNNEL RELEASE Left 2016  . INSERTION OF MESH N/A 01/08/2013   Procedure: INSERTION OF MESH;  Surgeon: Earnstine Regal, MD;  Location: [facility];  Service: General;  Laterality:  N/A;  . IR THORACENTESIS ASP PLEURAL SPACE W/IMG GUIDE  04/06/2017  . JOINT REPLACEMENT Right 1999  . TOTAL KNEE ARTHROPLASTY Left 1999   Left  . TRIGGER FINGER RELEASE Left 11/2014  . TRIGGER FINGER RELEASE Right   . VENTRAL HERNIA REPAIR N/A 01/08/2013   Procedure: LAPAROSCOPIC VENTRAL HERNIA;  Surgeon: Earnstine Regal, MD;  Location: [facility];  Service: General;  Laterality: N/A;   Family History  Problem Relation Age of Onset  . Leukemia Mother   . Hypertension Brother   . Emphysema Father    Social History   Tobacco Use  . Smoking status: Former Smoker    Years: 27.00    Types: Cigarettes    Quit date: 10/10/1981    Years since quitting: 38.8  . Smokeless tobacco: Never Used  Vaping Use  . Vaping Use: Never used  Substance Use Topics  . Alcohol use: No  . Drug use: No   ROS See pertinent in HPI. All other systems non contributory  Blood pressure 127/66, pulse 78, weight 196 lb (88.9 kg). GENERAL: Well-developed, well-nourished female in no acute distress. Patient with oxygen on via nasal canula ABDOMEN: Soft, nontender, nondistended. No organomegaly. PELVIC: Normal external female genitalia. Vagina is pink and rugated.  Normal discharge. Normal appearing cervix with copious amount of foul smelling dark blood oozing from the  cervix. Uterus is normal in size. No adnexal mass or tenderness. EXTREMITIES: No cyanosis, clubbing, or edema, 2+ distal pulses.  CT ABDOMEN PELVIS W CONTRAST  Result Date: 07/28/2020 CLINICAL DATA:  Abdomen pain with vaginal bleeding EXAM: CT ABDOMEN AND PELVIS WITH CONTRAST TECHNIQUE: Multidetector CT imaging of the abdomen and pelvis was performed using the standard protocol following bolus administration of intravenous contrast. CONTRAST:  168mL OMNIPAQUE IOHEXOL 300 MG/ML  [facility]N COMPARISON:  CT 12/12/2012 FINDINGS: Lower chest: Lung bases demonstrate patchy peripheral probable scarring at the right base. No acute consolidation or effusion. Dense mitral  calcification. No pericardial effusion Hepatobiliary: No focal liver abnormality is seen. No gallstones, gallbladder wall thickening, or biliary dilatation. Pancreas: Unremarkable. No pancreatic ductal dilatation or surrounding inflammatory changes. Spleen: Normal in size without focal abnormality. Adrenals/Urinary Tract: Right adrenal gland is normal. Stable small left adrenal gland nodule most likely adenoma. Atrophic kidneys with cortical scarring. No hydronephrosis. Possible small enhancing lesion off the mid right kidney best seen on coronal views, series 6, image number 43, this measures 11 mm. Indeterminate hypodense lesion upper pole left kidney, also best seen on coronal views, this measures 12 mm, series 6, image number 58. The urinary bladder is unremarkable. Delayed nephrogram with no significant excretion of contrast on delayed views consistent with decreased renal function. Stomach/Bowel: Stomach is within normal limits. Appendix appears normal. No evidence of bowel wall thickening, distention, or inflammatory changes. Vascular/Lymphatic: Mild aortic atherosclerosis without aneurysm. No suspicious nodes Reproductive: 4.7 cm anterior uterine mass consistent with a fibroid. Mildly thickened endometrial stripe. Distension of the cervix with 5.7 x 4.4 cm hypodense mass or complex fluid collection. 2 cm simple appearing left adnexal cyst, no further imaging is recommended. Stable rim calcified lesion in the right ovary. Other: Negative for free air or free fluid. Musculoskeletal: Degenerative changes throughout the lumbar spine. Suspected chronic inferior endplate deformity at R51. IMPRESSION: 1. Cervical enlargement by 5.7 x 4.4 cm hypodense mass or complex fluid collection/possible hemorrhagic product (distal cervical obstruction). Recommend correlation with gynecologic exam. Uterine fibroid. 2. Delayed excretion of contrast from the kidneys consistent with decreased renal function. Atrophic kidneys 3.  Indeterminate bilateral renal lesions as described above. When the patient is clinically stable and able to follow directions and hold their breath (preferably as an outpatient) further evaluation with dedicated abdominal MRI should be considered. Aortic Atherosclerosis (ICD10-I70.0). Electronically Signed   By: Donavan Foil M.D.   On: 07/28/2020 22:21     A/P 74 yo with postmenopausal vaginal bleeding - Reviewed CT findings with patient - Pap smear collected - Discussed benefits of endometrial biopsy ENDOMETRIAL BIOPSY     The indications for endometrial biopsy were reviewed.   Risks of the biopsy including cramping, bleeding, infection, uterine perforation, inadequate specimen and need for additional procedures  were discussed. The patient states she understands and agrees to undergo procedure today. Consent was signed. Time out was performed. Urine HCG was negative. A sterile speculum was placed in the patient's vagina and the cervix was prepped with Betadine. A single-toothed tenaculum was placed on the anterior lip of the cervix to stabilize it. The uterine cavity was sounded to a depth of 8 cm using the uterine sound. The 3 mm pipelle was introduced into the endometrial cavity without difficulty, 3 passes were made.  A  moderate amount of tissue was  sent to pathology. The instruments were removed from the patient's vagina. Minimal bleeding from the cervix was noted. The patient tolerated the procedure well.  Routine post-procedure instructions were given to the patient. The patient will follow up in two weeks to review the results and for further management.

## 2020-08-20 NOTE — Addendum Note (Signed)
Addended by: Michel Harrow on: 08/20/2020 09:34 AM   Modules accepted: Orders

## 2020-08-24 LAB — SURGICAL PATHOLOGY

## 2020-08-25 ENCOUNTER — Other Ambulatory Visit: Payer: Self-pay

## 2020-08-25 NOTE — Patient Outreach (Signed)
  [facility]) Care Management Chronic Special Needs Program  08/25/2020  Name: Jean Mcclain DOB: 1946/07/18  MRN: [id number]  Jean Mcclain is enrolled in a chronic special needs plan for  Diabetes.  Client called with no answer and unable to leave HIPAA compliant message.  3rd attempt  The client's individualized care plan was developed based on available data and 2021 Health Risk Assessment Goals Addressed            This Visit's Progress   . Client will report improved coping of chronic pain in the next 6 months(continue 9/3/321)   No change    Unable to assess Plan to keep follow up appointment with provider and discuss pain control as needed    . Client will use Assistive Devices as needed and verbalize understanding of device use   On track    Reports no issues with her glucometer and other home equipment    . Client will verbalize knowledge of self management of Hypertension as evidences by BP reading of 140/90 or less; or as defined by provider   On track    B/P less than 140/90 on provider visits and at dialysis 127/66 at last provider visit Plan to check B/P regularly Take B/P medications as ordered Plan to follow a low salt renal diet     . Client will verbalize understanding of treatment plan for impaired skin integrity and follow up with provider by next 6 months(continued 06/12/20   On track    Keep skin clean and dry Plan to keep pressure off of area Plan to call provider for any redness, swelling or signs of infection    . COMPLETED: General - Client will not be readmitted within 30 days ([facility])discharged 06/27/20   On track    No readmission to hospital post 30 days discharge Discharged hospital for repair fractured ankle discharged 06/27/20 Now [facility]     . HEMOGLOBIN A1C < 7        Last result 5.2% 04/07/20 Plan to check blood sugars as directed with goals fasting or 1 1/2 hours after eating with goal of 80-130 fasting and 180  or less after meals Plan to follow a low carbohydrate, low salt diet, watch portion sizes and avoid sugar sweetened drinks    . Obtain Annual Eye (retinal)  Exam    On track    Completed by [facility] in 2020 Plan to have a dilated eye exam every year    . Visit Primary Care Provider or Endocrinologist at least 2 times per year    On track    Primary care provider 11/25/19, 02/18/20, 04/14/20 Endocrinologist in 04/16/20 Annual wellness visit 04/14/20 [facility] provider 04/14/20         Plan:  . Send unsuccessful outreach letter with a copy of individualized care plan to client . Send individualized care plan to provider  Plan to collaborate with [facility] when client discharged from skilled nursing facility through 10/09/20 [facility] will continue to provide services for this client through 10/09/2020. [facility] care management team will assume care 10/10/2020.  Peter Garter RN, Jackquline Denmark, CDE Chronic Care Management Coordinator [facility] [phone]

## 2020-08-26 ENCOUNTER — Encounter: Payer: Self-pay | Admitting: *Deleted

## 2020-08-27 ENCOUNTER — Telehealth: Payer: Self-pay | Admitting: Lactation Services

## 2020-08-27 ENCOUNTER — Other Ambulatory Visit: Payer: Self-pay | Admitting: Obstetrics and Gynecology

## 2020-08-27 LAB — CYTOLOGY - PAP
Comment: NEGATIVE
Comment: NEGATIVE
Diagnosis: NEGATIVE
HPV 16: NEGATIVE
HPV 18 / 45: NEGATIVE
High risk HPV: POSITIVE — AB

## 2020-08-27 NOTE — Telephone Encounter (Signed)
-----   Message from Mora Bellman, MD sent at 08/27/2020  1:15 PM EST ----- Please inform patient of inconclusive endometrial biospy results due to too much bleeding at the time of the biopsy.  I would like to repeat the same procedure in the operating room if she allows in order to obtain a better sample for analysis. Please let me know if the patient agrees in order for me to schedule the procedure

## 2020-08-27 NOTE — Telephone Encounter (Signed)
Called patient with results. Patient a resident at [facility] in [place]. Was transferred to patients room and able to speak to patient.   Patient given options for getting another sample while under anesthesia. Patient would like to proceed. Will message Dr. Elly Modena to schedule. Patient has Dialysis on Monday-Wednesday-Friday so has some limitations on scheduling.

## 2020-09-02 DIAGNOSIS — L89623 Pressure ulcer of left heel, stage 3: Secondary | ICD-10-CM | POA: Diagnosis not present

## 2020-09-02 DIAGNOSIS — N186 End stage renal disease: Secondary | ICD-10-CM | POA: Diagnosis not present

## 2020-09-02 DIAGNOSIS — L97521 Non-pressure chronic ulcer of other part of left foot limited to breakdown of skin: Secondary | ICD-10-CM | POA: Diagnosis not present

## 2020-09-02 DIAGNOSIS — E119 Type 2 diabetes mellitus without complications: Secondary | ICD-10-CM | POA: Diagnosis not present

## 2020-09-08 DIAGNOSIS — Z992 Dependence on renal dialysis: Secondary | ICD-10-CM | POA: Diagnosis not present

## 2020-09-08 DIAGNOSIS — E1022 Type 1 diabetes mellitus with diabetic chronic kidney disease: Secondary | ICD-10-CM | POA: Diagnosis not present

## 2020-09-08 DIAGNOSIS — N186 End stage renal disease: Secondary | ICD-10-CM | POA: Diagnosis not present

## 2020-09-09 DIAGNOSIS — N186 End stage renal disease: Secondary | ICD-10-CM | POA: Diagnosis not present

## 2020-09-09 DIAGNOSIS — E876 Hypokalemia: Secondary | ICD-10-CM | POA: Diagnosis not present

## 2020-09-09 DIAGNOSIS — Z23 Encounter for immunization: Secondary | ICD-10-CM | POA: Diagnosis not present

## 2020-09-09 DIAGNOSIS — D509 Iron deficiency anemia, unspecified: Secondary | ICD-10-CM | POA: Diagnosis not present

## 2020-09-09 DIAGNOSIS — Z992 Dependence on renal dialysis: Secondary | ICD-10-CM | POA: Diagnosis not present

## 2020-09-10 ENCOUNTER — Telehealth: Payer: Self-pay | Admitting: Obstetrics and Gynecology

## 2020-09-10 DIAGNOSIS — R633 Feeding difficulties, unspecified: Secondary | ICD-10-CM | POA: Diagnosis not present

## 2020-09-10 DIAGNOSIS — Z4789 Encounter for other orthopedic aftercare: Secondary | ICD-10-CM | POA: Diagnosis not present

## 2020-09-10 DIAGNOSIS — N186 End stage renal disease: Secondary | ICD-10-CM | POA: Diagnosis not present

## 2020-09-10 DIAGNOSIS — M6281 Muscle weakness (generalized): Secondary | ICD-10-CM | POA: Diagnosis not present

## 2020-09-10 DIAGNOSIS — R2681 Unsteadiness on feet: Secondary | ICD-10-CM | POA: Diagnosis not present

## 2020-09-10 DIAGNOSIS — S82422D Displaced transverse fracture of shaft of left fibula, subsequent encounter for closed fracture with routine healing: Secondary | ICD-10-CM | POA: Diagnosis not present

## 2020-09-10 DIAGNOSIS — R278 Other lack of coordination: Secondary | ICD-10-CM | POA: Diagnosis not present

## 2020-09-10 NOTE — Telephone Encounter (Signed)
Per chart review do not see appointment scheduled. Will send message to scheduler and MD to see if in process.  Tyshan Enderle,RN

## 2020-09-10 NOTE — Telephone Encounter (Signed)
Per Dr. Elly Modena may be having issues scheduling due to conflict with patient dialysis schedule. Need to ask patient if she is ok with other providers doing procedure- that may help free up an appointment sooner. I called [facility] home number and left a message I am calling about your appointment- please call our office. I called her brother at [phone] and he said she is in [facility] and the number we have is for [facility] [phone]. States she does not have a phone. I informed him I am not sure that I can discuss with him without her permission and that I have left a message on the number for her / them to call us.  Vonette Grosso,RN

## 2020-09-10 NOTE — Telephone Encounter (Signed)
patient Brother called wanting to know when will they hear about her getting scheduled (under anesthesia)

## 2020-09-15 DIAGNOSIS — R52 Pain, unspecified: Secondary | ICD-10-CM | POA: Diagnosis not present

## 2020-09-15 DIAGNOSIS — J449 Chronic obstructive pulmonary disease, unspecified: Secondary | ICD-10-CM | POA: Diagnosis not present

## 2020-09-15 DIAGNOSIS — Z86718 Personal history of other venous thrombosis and embolism: Secondary | ICD-10-CM | POA: Diagnosis not present

## 2020-09-16 NOTE — Telephone Encounter (Signed)
Per chart review see that surgery has been scheduled 09/29/20.  Scheduling reaches out to patient / family with needed information. Clodagh Odenthal,RN

## 2020-09-21 DIAGNOSIS — L989 Disorder of the skin and subcutaneous tissue, unspecified: Secondary | ICD-10-CM | POA: Diagnosis not present

## 2020-09-21 DIAGNOSIS — L89321 Pressure ulcer of left buttock, stage 1: Secondary | ICD-10-CM | POA: Diagnosis not present

## 2020-09-21 DIAGNOSIS — L89312 Pressure ulcer of right buttock, stage 2: Secondary | ICD-10-CM | POA: Diagnosis not present

## 2020-09-23 DIAGNOSIS — E038 Other specified hypothyroidism: Secondary | ICD-10-CM | POA: Diagnosis not present

## 2020-09-25 DIAGNOSIS — N186 End stage renal disease: Secondary | ICD-10-CM | POA: Diagnosis not present

## 2020-09-25 DIAGNOSIS — Z86718 Personal history of other venous thrombosis and embolism: Secondary | ICD-10-CM | POA: Diagnosis not present

## 2020-09-25 DIAGNOSIS — E1169 Type 2 diabetes mellitus with other specified complication: Secondary | ICD-10-CM | POA: Diagnosis not present

## 2020-09-25 DIAGNOSIS — E785 Hyperlipidemia, unspecified: Secondary | ICD-10-CM | POA: Diagnosis not present

## 2020-09-25 DIAGNOSIS — J449 Chronic obstructive pulmonary disease, unspecified: Secondary | ICD-10-CM | POA: Diagnosis not present

## 2020-09-25 NOTE — Progress Notes (Signed)
Reviewed pt chart for pre-op interview for surgery scheduled on  09-29-2020 @[facility] .   Patient is in a nursing facility ([facility]) .  Noted pt recent history of respiratory failure, severe OSA , obesity hypoventilation syndrome.  Pt is using cpap/ bipap at this time and has supplemental oxygen.  Had chart reviewed by anesthesia, Konrad Felix PA, if pt a candidate for [facility] due to medical complexity.  Per anesthesia, Janett Billow, per pulmonology noes at this time pt intolerate to BiPap/ Cpap prescribed triolgy ventilator which her facility will not allow.  Recommend case be moved to main OR due to risk of respiratory failure with recent hospitalization and intolerant/ inability to use prescribed airway support regimen.  Called and spoke w/ [place] , [place] scheduler for Dr Elly Modena, inform her of need to moved pt to main OR and to read my progress note.

## 2020-09-28 ENCOUNTER — Other Ambulatory Visit: Payer: Self-pay

## 2020-09-28 ENCOUNTER — Encounter (HOSPITAL_COMMUNITY): Payer: Self-pay | Admitting: Obstetrics and Gynecology

## 2020-09-28 NOTE — Anesthesia Preprocedure Evaluation (Addendum)
Anesthesia Evaluation  Patient identified by MRN, date of birth, ID band Patient awake    Reviewed: Allergy & Precautions, NPO status , Patient's Chart, lab work & pertinent test results, reviewed documented beta blocker date and time   Airway Mallampati: II  TM Distance: >3 FB Neck ROM: Full    Dental  (+) Partial Lower, Caps, Chipped, Dental Advisory Given   Pulmonary shortness of breath, with exertion, at rest, lying and Long-Term Oxygen Therapy, sleep apnea and Continuous Positive Airway Pressure Ventilation , former smoker,  Unable to achieve good mask fit for CPAP   breath sounds clear to auscultation + decreased breath sounds      Cardiovascular hypertension, Pt. on medications + DVT   Rhythm:Regular Rate:Normal  Venous stasis ulcers  EKG 07/03/20 NSR, LVH  Echo 03/11/17 Left ventricle: The cavity size was mildly reduced. Mild posterior wall and severe basal septal hypertrophy. Systolic function was normal. The estimated ejection fraction was in the range of 60% to 65%. Wall motion was normal; there were no regional wall motion abnormalities. Doppler parameters are consistent with abnormal left ventricular relaxation (grade 1diastolic dysfunction). Doppler parameters are consistent with high ventricular filling pressure.  - Mitral valve: Moderately to severely calcified annulus. The findings are consistent with mild stenosis. Mean gradient (D): 6 mm Hg. Valve area by continuity equation (using LVOT flow): 1.82 cm^2.    Neuro/Psych Anxiety Parkinson's disease Restless legs    GI/Hepatic GERD  Medicated and Controlled,  Endo/Other  diabetes, Poorly Controlled, Type 2, Insulin DependentMorbid obesityObesity Hyperlipidemia Secondary hyperparathyroidism  Renal/GU Dialysis and ESRFRenal diseaseLast dialysis 09/28/20  negative genitourinary   Musculoskeletal  (+) Arthritis , Osteoarthritis,    Abdominal (+) + obese,    Peds  Hematology  (+) anemia , Eliquis therapy- last dose 12/20   Anesthesia Other Findings   Reproductive/Obstetrics                          Anesthesia Physical Anesthesia Plan  ASA: IV  Anesthesia Plan: General   Post-op Pain Management:    Induction: Intravenous  PONV Risk Score and Plan: 3 and Treatment may vary due to age or medical condition and Ondansetron  Airway Management Planned: LMA  Additional Equipment:   Intra-op Plan:   Post-operative Plan: Extubation in OR  Informed Consent: I have reviewed the patients History and Physical, chart, labs and discussed the procedure including the risks, benefits and alternatives for the proposed anesthesia with the patient or authorized representative who has indicated his/her understanding and acceptance.     Dental advisory given  Plan Discussed with: CRNA and Anesthesiologist  Anesthesia Plan Comments: (See PAT note 09/28/2020, Konrad Felix, PA-C)       Anesthesia Quick Evaluation

## 2020-09-28 NOTE — Progress Notes (Signed)
COVID Vaccine Completed: x2 Date COVID Vaccine completed:  12-12-19 & 01-10-20 COVID vaccine manufacturer:    [employer]     PCP - Leanna Battles, MD Cardiologist -   Chest x-ray - 06-20-20 in Epic EKG - 07-03-20 in Epic Stress Test -  ECHO - 03-10-17 in Epic Cardiac Cath -  Pacemaker/ICD device last checked:  Sleep Study - 11-07-18 in Epic CPAP -   Fasting Blood Sugar -  Checks Blood Sugar _____ times a day  Blood Thinner Instructions: Eliquis 2.5 mg Aspirin Instructions: Last Dose:  Anesthesia review:  Hx of respiratory failure 06-2020.  Hx of CKD on dialysis, HTN, OSA,  SOB with exertion. Hx of blood clots  Patient denies shortness of breath, fever, cough and chest pain at PAT appointment   Patient verbalized understanding of instructions that were given to them at the PAT appointment. Patient was also instructed that they will need to review over the PAT instructions again at home before surgery.

## 2020-09-28 NOTE — H&P (Signed)
Jean Mcclain is an 74 y.o. female P0 with postmenopausal vaginal bleeding presenting today for scheduled dilatation and currettage. Patient with a 2- month history of postmenopausal vaginal bleeding and CT findings concerning for an endometrial mass. Patient denies any pelvic pain and reports resolution of vaginal bleeding  Pertinent Gynecological History: Last mammogram: previously ordered Last pap: normal Date: 08/2020 OB History: G0   Menstrual History: No LMP recorded. Patient is postmenopausal.    Past Medical History:  Diagnosis Date  . Acute and chronic respiratory failure (acute-on-chronic) ([place])   . Anxiety   . Arthritis    osteo  . Chronic kidney disease   . Diabetes mellitus, type 2 (HCC)    Type II  . DVT (deep venous thrombosis) ([place]) 03/2017   left popliteal  . Facet joint disease   . Facet joint disease    foot  . Fibula fracture 02/2017   left  . GERD (gastroesophageal reflux disease)   . H/O blood clots   . Hyperlipidemia   . Hypertension   . Macrocytic anemia   . Morbid obesity ([place])   . OSA (obstructive sleep apnea)    does not use CPAP   . Osteopenia   . Renal disorder    MWF dialysis  . Shortness of breath    with exertion  . Supplemental oxygen dependent   . Venous stasis ulcers ([place])   . Vitamin D deficiency     Past Surgical History:  Procedure Laterality Date  . ANKLE FUSION Left 06/24/2020   Procedure: LEFT TIBIOCALCANEAL FUSION;  Surgeon: [name], MD;  Location: [facility];  Service: Orthopedics;  Laterality: Left;  . AV FISTULA PLACEMENT Right 08/20/2018   Procedure: ARTERIOVENOUS (AV) FISTULA CREATION BRACHIOCEPHALIC;  Surgeon: Angelia Mould, MD;  Location: [facility];  Service: Vascular;  Laterality: Right;  . BREAST SURGERY Right 03/1997   biospy  . CARPAL TUNNEL RELEASE Left 2016  . INSERTION OF MESH N/A 01/08/2013   Procedure: INSERTION OF MESH;  Surgeon: Earnstine Regal, MD;  [facility];  Service: General;   Laterality: N/A;  . IR THORACENTESIS ASP PLEURAL SPACE W/IMG GUIDE  04/06/2017  . JOINT REPLACEMENT Right 1999  . TOTAL KNEE ARTHROPLASTY Left 1999   Left  . TRIGGER FINGER RELEASE Left 11/2014  . TRIGGER FINGER RELEASE Right   . VENTRAL HERNIA REPAIR N/A 01/08/2013   Procedure: LAPAROSCOPIC VENTRAL HERNIA;  Surgeon: Earnstine Regal, MD;  [facility];  Service: General;  Laterality: N/A;    Family History  Problem Relation Age of Onset  . Leukemia Mother   . Hypertension Brother   . Emphysema Father     Social History:  reports that she quit smoking about 38 years ago. Her smoking use included cigarettes. She quit after 27.00 years of use. She has never used smokeless tobacco. She reports that she does not drink alcohol and does not use drugs.  Allergies: No Known Allergies  Medications Prior to Admission  Medication Sig Dispense Refill Last Dose  . apixaban (ELIQUIS) 2.5 MG TABS tablet Take 2.5 mg by mouth 2 (two) times daily.   09/28/2020 at pm  . calcium acetate (PHOSLO) 667 MG tablet Take 667 mg by mouth in the morning, at noon, in the evening, and at bedtime.    09/28/2020 at Unknown time  . carbidopa-levodopa (SINEMET IR) 10-100 MG tablet Take 1 tablet by mouth 2 (two) times daily.   09/28/2020 at Unknown time  . colchicine 0.6  MG tablet Take 0.3 mg by mouth daily.   09/28/2020 at Unknown time  . doxercalciferol (HECTOROL) 4 MCG/2ML injection Inject 1 mL (2 mcg total) into the vein every Monday, Wednesday, and Friday with hemodialysis. 2 mL  09/28/2020 at Unknown time  . febuxostat (ULORIC) 40 MG tablet Take 40 mg by mouth daily.   09/28/2020 at Unknown time  . gabapentin (NEURONTIN) 100 MG capsule Take 1 capsule (100 mg total) by mouth at bedtime.   09/28/2020 at Unknown time  . insulin NPH Human (NOVOLIN N) 100 UNIT/ML injection Inject 0.1 mLs (10 Units total) into the skin 2 (two) times daily at 8 am and 10 pm. (Patient taking differently: Inject 10 Units into the skin 2 (two)  times daily before a meal.)   09/28/2020 at Unknown time  . Multiple Vitamin (MULTIVITAMIN WITH MINERALS) TABS tablet Take 1 tablet by mouth daily.   09/28/2020 at Unknown time  . omeprazole (PRILOSEC) 20 MG capsule Take 20 mg by mouth daily before breakfast.    09/28/2020 at Unknown time  . rOPINIRole (REQUIP) 4 MG tablet Take 4 mg by mouth 2 (two) times daily.   09/28/2020 at Unknown time  . Zinc Oxide (TRIPLE PASTE) 12.8 % ointment Apply topically 4 (four) times daily. (Patient taking differently: Apply 1 application topically 4 (four) times daily.)   09/28/2020 at Unknown time  . acetaminophen (TYLENOL) 500 MG tablet Take 1 tablet (500 mg total) by mouth every 6 (six) hours as needed for mild pain or headache. 30 tablet 0 Unknown at Unknown time  . albuterol (VENTOLIN HFA) 108 (90 Base) MCG/ACT inhaler Inhale 2 puffs into the lungs every 6 (six) hours as needed for wheezing or shortness of breath.    Unknown at Unknown time  . B-D INS SYR ULTRAFINE 1CC/30G 30G X 1/2" 1 ML MISC 1 each by Other route daily.    supply  . Blood Glucose Monitoring Suppl ([place]) w/Device KIT    supply  . glucose blood test strip 1 each by Other route daily.    supply  . Lancets (ONETOUCH DELICA PLUS [id number]) MISC 1 each by Other route daily.    supply  . oxyCODONE (OXY IR/ROXICODONE) 5 MG immediate release tablet Take 1 tablet (5 mg total) by mouth every 6 (six) hours as needed for moderate pain or severe pain. 20 tablet 0 Unknown at Unknown time  . OXYGEN Inhale 2 L into the lungs as needed (shortness of breath).   Unknown at Unknown time    Review of Systems See pertinent in HPI. All other systems reviewed and negative Blood pressure 132/63, pulse 75, temperature 99 F (37.2 C), temperature source Oral, resp. rate 18, height 5' (1.524 m), weight 89.9 kg, SpO2 97 %. Physical Exam GENERAL: Well-developed, well-nourished female in no acute distress.  LUNGS: Clear to auscultation bilaterally.   HEART: Regular rate and rhythm. ABDOMEN: Soft, nontender, nondistended. No organomegaly. PELVIC: Deferred to OR EXTREMITIES: No cyanosis, clubbing, or edema, 2+ distal pulses.  Results for orders placed or performed during the hospital encounter of 09/29/20 (from the past 24 hour(s))  Resp Panel by RT-PCR (Flu A&B, Covid) Nasopharyngeal Swab     Status: None   Collection Time: 09/29/20  7:15 AM   Specimen: Nasopharyngeal Swab; Nasopharyngeal(NP) swabs in vial transport medium  Result Value Ref Range   SARS Coronavirus 2 by RT PCR NEGATIVE NEGATIVE   Influenza A by PCR NEGATIVE NEGATIVE   Influenza B by PCR NEGATIVE  NEGATIVE  Glucose, capillary     Status: Abnormal   Collection Time: 09/29/20  7:15 AM  Result Value Ref Range   Glucose-Capillary 119 (H) 70 - 99 mg/dL  Basic metabolic panel per protocol     Status: Abnormal   Collection Time: 09/29/20  7:50 AM  Result Value Ref Range   Sodium 142 135 - 145 mmol/L   Potassium 3.3 (L) 3.5 - 5.1 mmol/L   Chloride 96 (L) 98 - 111 mmol/L   CO2 36 (H) 22 - 32 mmol/L   Glucose, Bld 122 (H) 70 - 99 mg/dL   BUN 13 8 - 23 mg/dL   Creatinine, Ser 2.21 (H) 0.44 - 1.00 mg/dL   Calcium 8.5 (L) 8.9 - 10.3 mg/dL   GFR, Estimated 23 (L) >60 mL/min   Anion gap 10 5 - 15  CBC per protocol     Status: Abnormal   Collection Time: 09/29/20  7:50 AM  Result Value Ref Range   WBC 9.2 4.0 - 10.5 K/uL   RBC 3.87 3.87 - 5.11 MIL/uL   Hemoglobin 11.6 (L) 12.0 - 15.0 g/dL   HCT 38.4 36.0 - 46.0 %   MCV 99.2 80.0 - 100.0 fL   MCH 30.0 26.0 - 34.0 pg   MCHC 30.2 30.0 - 36.0 g/dL   RDW 14.4 11.5 - 15.5 %   Platelets 196 150 - 400 K/uL   nRBC 0.0 0.0 - 0.2 %  Type and screen     Status: None   Collection Time: 09/29/20  7:50 AM  Result Value Ref Range   ABO/RH(D) O POS    Antibody Screen NEG    Sample Expiration      10/02/2020,2359 Performed at [facility] [place] [address], [place], [place] [address]   ABO/Rh     Status: None    Collection Time: 09/29/20  7:55 AM  Result Value Ref Range   ABO/RH(D)      O POS Performed at [facility], [place] [address] [place], [place] [address]     No results found. Result Date: 07/28/2020 CLINICAL DATA:  Abdomen pain with vaginal bleeding EXAM: CT ABDOMEN AND PELVIS WITH CONTRAST TECHNIQUE: Multidetector CT imaging of the abdomen and pelvis was performed using the standard protocol following bolus administration of intravenous contrast. CONTRAST:  163m OMNIPAQUE IOHEXOL 300 MG/ML  SOLN COMPARISON:  CT 12/12/2012 FINDINGS: Lower chest: Lung bases demonstrate patchy peripheral probable scarring at the right base. No acute consolidation or effusion. Dense mitral calcification. No pericardial effusion Hepatobiliary: No focal liver abnormality is seen. No gallstones, gallbladder wall thickening, or biliary dilatation. Pancreas: Unremarkable. No pancreatic ductal dilatation or surrounding inflammatory changes. Spleen: Normal in size without focal abnormality. Adrenals/Urinary Tract: Right adrenal gland is normal. Stable small left adrenal gland nodule most likely adenoma. Atrophic kidneys with cortical scarring. No hydronephrosis. Possible small enhancing lesion off the mid right kidney best seen on coronal views, series 6, image number 43, this measures 11 mm. Indeterminate hypodense lesion upper pole left kidney, also best seen on coronal views, this measures 12 mm, series 6, image number 58. The urinary bladder is unremarkable. Delayed nephrogram with no significant excretion of contrast on delayed views consistent with decreased renal function. Stomach/Bowel: Stomach is within normal limits. Appendix appears normal. No evidence of bowel wall thickening, distention, or inflammatory changes. Vascular/Lymphatic: Mild aortic atherosclerosis without aneurysm. No suspicious nodes Reproductive: 4.7 cm anterior uterine mass consistent with a fibroid. Mildly thickened endometrial stripe.  Distension of the cervix with 5.7 x 4.4 cm hypodense mass or complex fluid collection. 2 cm simple appearing left adnexal cyst, no further imaging is recommended. Stable rim calcified lesion in the right ovary. Other: Negative for free air or free fluid. Musculoskeletal: Degenerative changes throughout the lumbar spine. Suspected chronic inferior endplate deformity at C26. IMPRESSION: 1. Cervical enlargement by 5.7 x 4.4 cm hypodense mass or complex fluid collection/possible hemorrhagic product (distal cervical obstruction). Recommend correlation with gynecologic exam. Uterine fibroid. 2. Delayed excretion of contrast from the kidneys consistent with decreased renal function. Atrophic kidneys 3. Indeterminate bilateral renal lesions as described above. When the patient is clinically stable and able to follow directions and hold their breath (preferably as an outpatient) further evaluation with dedicated abdominal MRI should be considered. Aortic Atherosclerosis (ICD10-I70.0). Electronically Signed   By: Donavan Foil M.D.   On: 07/28/2020 22:21   Assessment/Plan: 74 yo P0 with postmenopausal vaginal bleeding her for D&C - Risks, benefits and alternatives were explained including but not limited to risks of bleeding, infection and damage to adjacent organs. Patient verbalized understanding and all questions were answered  Axl Rodino 09/29/2020, 9:32 AM

## 2020-09-28 NOTE — Patient Instructions (Addendum)
Preop instructions for: Jean Mcclain    Date of Birth: 08-25-1946                       Date of Procedure: 09/28/2020   Procedure:  DILATATION AND CURETTAGE /HYSTEROSCOPY    Surgeon: Dr. Mora Bellman Facility contact: [place]    Phone:  [place]: RN contact name/phone#:   [place]                      and Fax #: [phone]   Transportation contact phone#: [facility]AR    Time to arrive at [facility]: 7:00 AM   Report to: Admitting (On your left hand side)    Do not eat or drink past midnight the night before your procedure.(To include any tube feedings-must be discontinued)   Take these morning medications only with sips of water.(or give through gastrostomy or feeding tube). Carbidopa, Omeprazole, Requip, Uloric   Insulin only take half normal dose the night before surgery and morning of surgery   Please send day of procedure:current med list and meds last taken that day, confirm nothing by mouth status from what time, Patient Demographic info( to include DNR status, problem list, allergies)   Bring Insurance card and picture ID Leave all jewelry and other valuables at place where living( no metal or rings to be worn) No contact lens Women-no make-up, no lotions,perfumes,powders   Any questions day of procedure,call  SHORT STAY-[phone]     Sent from :[facility]                   Phone:[phone]                   Fax:[phone]   Sent by : Harlon Flor, BSN, RN

## 2020-09-28 NOTE — Progress Notes (Signed)
COVID Vaccine Completed: x2 Date COVID Vaccine completed:  12-12-19 & 01-10-20 COVID vaccine manufacturer:    [place]     PCP - Leanna Battles, MD Cardiologist - N/A  Chest x-ray - 06-20-20 in Epic EKG - 07-03-20 in Epic Stress Test -  N/A ECHO - 03-10-17 in Epic Cardiac Cath -  N/A Pacemaker/ICD device last checked:  N/A   Sleep Study - 11-07-18 in Epic CPAP - N/A  Fasting Blood Sugar - 180's Checks Blood Sugar __2-3___ times a day  Blood Thinner Instructions: Eliquis 2.5 mg is still currently taking Aspirin Instructions:  N/A Last Dose:N/A  Anesthesia review:  Hx of respiratory failure 06-2020.  Hx of CKD on dialysis, HTN, OSA,  SOB with exertion. Hx of blood clots  Patient denies shortness of breath, fever, cough and chest pain at PAT appointment   Patient verbalized understanding of instructions that were given to them at the PAT appointment. Patient was also instructed that they will need to review over the PAT instructions again at home before surgery.

## 2020-09-28 NOTE — Progress Notes (Signed)
Anesthesia Chart Review   Case: [id number] Date/Time: 09/29/20 0950   Procedure: DILATATION AND CURETTAGE /HYSTEROSCOPY (N/A )   Anesthesia type: Choice   Pre-op diagnosis: PMB   [facility] 04 / [facility]   Surgeons: Constant, Peggy, MD      DISCUSSION:74 y.o. former smoker with h/o GERD, DM II, HTN, ESRD on dialysis MWF, OSA, uses supplemental O2 at night, DVT (on Eliquis), PMB scheduled for above procedure 09/29/2020 with Dr. Mora Bellman.   SDW, pt resides in SNF.   Scheduled for orthopedic surgery 06/17/20. Surgery cancelled due to pt becoming confused and lethargic, due to acute hypercarbic respiratory failure.  Improved on bipap.  Pt rescheduled 06/24/2020. No anesthesia complications noted.  Discharged to [facility] 06/26/2020.   Pt has since followed up with pulmonology. Pt last seen by pulmonologist 07/28/2020. Per OV note pt previously using bipap, mask not fitting with leaks, unable to tolerate.  Trilogy ventilator prescribed.  Current SNF cannot accommodate this device.   Discussed with Dr. Lissa Hoard.  Anticipate pt can proceed with planned procedure barring acute status change and after evaluation DOS (SDW).  VS: There were no vitals taken for this visit.  PROVIDERSLeanna Battles, MD is PCP   Sherrilyn Rist, MD is Pulmonologist  LABS: SDW (all labs ordered are listed, but only abnormal results are displayed)  Labs Reviewed - No data to display   IMAGES:   EKG: 07/03/2020 Rate 76 bpm  Sinus rhythm  Low voltage, precordial leads Left ventricular hypertrophy Borderline T abnormalities, inferior leads  CV: Echo 03/11/2017 Study Conclusions   - Left ventricle: The cavity size was mildly reduced. Mild  posterior wall and severe basal septal hypertrophy. Systolic  function was normal. The estimated ejection fraction was in the  range of 60% to 65%. Wall motion was normal; there were no  regional wall motion abnormalities. Doppler parameters are   consistent with abnormal left ventricular relaxation (grade 1  diastolic dysfunction). Doppler parameters are consistent with  high ventricular filling pressure.  - Mitral valve: Moderately to severely calcified annulus. The  findings are consistent with mild stenosis. Mean gradient (D): 6  mm Hg. Valve area by continuity equation (using LVOT flow): 1.82  cm^2.  Past Medical History:  Diagnosis Date  . Acute and chronic respiratory failure (acute-on-chronic) ([place])   . Anxiety   . Arthritis    osteo  . Chronic kidney disease   . Diabetes mellitus, type 2 (HCC)    Type II  . DVT (deep venous thrombosis) ([place]) 03/2017   left popliteal  . Facet joint disease   . Facet joint disease    foot  . Fibula fracture 02/2017   left  . GERD (gastroesophageal reflux disease)   . H/O blood clots   . Hyperlipemia   . Hyperlipidemia   . Hypertension   . Morbid obesity ([place])   . OSA (obstructive sleep apnea)    does not use CPAP   . Osteopenia   . Renal disorder    MWF dialysis  . Shortness of breath    with exertion  . Venous stasis ulcers ([place])   . Vitamin D deficiency     Past Surgical History:  Procedure Laterality Date  . ANKLE FUSION Left 06/24/2020   Procedure: LEFT TIBIOCALCANEAL FUSION;  Surgeon: [name], MD;  Location: [facility];  Service: Orthopedics;  Laterality: Left;  . AV FISTULA PLACEMENT Right 08/20/2018   Procedure: ARTERIOVENOUS (AV) FISTULA CREATION BRACHIOCEPHALIC;  Surgeon: Deitra Mayo  S, MD;  Location: [facility];  Service: Vascular;  Laterality: Right;  . BREAST SURGERY Right 03/1997   biospy  . CARPAL TUNNEL RELEASE Left 2016  . INSERTION OF MESH N/A 01/08/2013   Procedure: INSERTION OF MESH;  Surgeon: Earnstine Regal, MD;  [facility];  Service: General;  Laterality: N/A;  . IR THORACENTESIS ASP PLEURAL SPACE W/IMG GUIDE  04/06/2017  . JOINT REPLACEMENT Right 1999  . TOTAL KNEE ARTHROPLASTY Left 1999   Left  . TRIGGER FINGER RELEASE  Left 11/2014  . TRIGGER FINGER RELEASE Right   . VENTRAL HERNIA REPAIR N/A 01/08/2013   Procedure: LAPAROSCOPIC VENTRAL HERNIA;  Surgeon: Earnstine Regal, MD;  [facility];  Service: General;  Laterality: N/A;    MEDICATIONS: No current facility-administered medications for this encounter.   Marland Kitchen acetaminophen (TYLENOL) 500 MG tablet  . albuterol (VENTOLIN HFA) 108 (90 Base) MCG/ACT inhaler  . apixaban (ELIQUIS) 2.5 MG TABS tablet  . B-D INS SYR ULTRAFINE 1CC/30G 30G X 1/2" 1 ML MISC  . Blood Glucose Monitoring Suppl (ONETOUCH VERIO FLEX SYSTEM) w/Device KIT  . calcium acetate (PHOSLO) 667 MG tablet  . carbidopa-levodopa (SINEMET IR) 10-100 MG tablet  . colchicine 0.6 MG tablet  . doxercalciferol (HECTOROL) 4 MCG/2ML injection  . febuxostat (ULORIC) 40 MG tablet  . gabapentin (NEURONTIN) 100 MG capsule  . glucose blood test strip  . insulin NPH Human (NOVOLIN N) 100 UNIT/ML injection  . Lancets (ONETOUCH DELICA PLUS [id number]) MISC  . Multiple Vitamin (MULTIVITAMIN WITH MINERALS) TABS tablet  . omeprazole (PRILOSEC) 20 MG capsule  . oxyCODONE (OXY IR/ROXICODONE) 5 MG immediate release tablet  . OXYGEN  . rOPINIRole (REQUIP) 4 MG tablet  . Zinc Oxide (TRIPLE PASTE) 12.8 % ointment    Konrad Felix, PA-C [facility] [phone]

## 2020-09-29 ENCOUNTER — Encounter (HOSPITAL_COMMUNITY): Payer: Self-pay | Admitting: Obstetrics and Gynecology

## 2020-09-29 ENCOUNTER — Encounter (HOSPITAL_COMMUNITY)
Admission: RE | Disposition: A | Discharge status: Skilled Nursing Facility | Payer: Self-pay | Source: Home / Self Care | Attending: Obstetrics and Gynecology

## 2020-09-29 ENCOUNTER — Ambulatory Visit (HOSPITAL_COMMUNITY)
Admission: RE | Admit: 2020-09-29 | Discharge: 2020-09-29 | Disposition: A | Discharge status: Skilled Nursing Facility | Payer: HMO | Attending: Obstetrics and Gynecology | Admitting: Obstetrics and Gynecology

## 2020-09-29 ENCOUNTER — Ambulatory Visit (HOSPITAL_COMMUNITY): Payer: HMO | Admitting: Physician Assistant

## 2020-09-29 DIAGNOSIS — I120 Hypertensive chronic kidney disease with stage 5 chronic kidney disease or end stage renal disease: Secondary | ICD-10-CM | POA: Diagnosis not present

## 2020-09-29 DIAGNOSIS — Z7901 Long term (current) use of anticoagulants: Secondary | ICD-10-CM | POA: Insufficient documentation

## 2020-09-29 DIAGNOSIS — I959 Hypotension, unspecified: Secondary | ICD-10-CM | POA: Diagnosis not present

## 2020-09-29 DIAGNOSIS — Z79899 Other long term (current) drug therapy: Secondary | ICD-10-CM | POA: Diagnosis not present

## 2020-09-29 DIAGNOSIS — N840 Polyp of corpus uteri: Secondary | ICD-10-CM | POA: Diagnosis not present

## 2020-09-29 DIAGNOSIS — Z20822 Contact with and (suspected) exposure to covid-19: Secondary | ICD-10-CM | POA: Diagnosis not present

## 2020-09-29 DIAGNOSIS — Z87891 Personal history of nicotine dependence: Secondary | ICD-10-CM | POA: Diagnosis not present

## 2020-09-29 DIAGNOSIS — E1122 Type 2 diabetes mellitus with diabetic chronic kidney disease: Secondary | ICD-10-CM | POA: Diagnosis not present

## 2020-09-29 DIAGNOSIS — N950 Postmenopausal bleeding: Secondary | ICD-10-CM

## 2020-09-29 DIAGNOSIS — J9600 Acute respiratory failure, unspecified whether with hypoxia or hypercapnia: Secondary | ICD-10-CM | POA: Diagnosis not present

## 2020-09-29 DIAGNOSIS — R279 Unspecified lack of coordination: Secondary | ICD-10-CM | POA: Diagnosis not present

## 2020-09-29 DIAGNOSIS — R58 Hemorrhage, not elsewhere classified: Secondary | ICD-10-CM | POA: Diagnosis not present

## 2020-09-29 DIAGNOSIS — Z992 Dependence on renal dialysis: Secondary | ICD-10-CM | POA: Diagnosis not present

## 2020-09-29 DIAGNOSIS — Z794 Long term (current) use of insulin: Secondary | ICD-10-CM | POA: Insufficient documentation

## 2020-09-29 DIAGNOSIS — N186 End stage renal disease: Secondary | ICD-10-CM | POA: Diagnosis not present

## 2020-09-29 DIAGNOSIS — Z743 Need for continuous supervision: Secondary | ICD-10-CM | POA: Diagnosis not present

## 2020-09-29 HISTORY — DX: Nutritional anemia, unspecified: D53.9

## 2020-09-29 HISTORY — PX: HYSTEROSCOPY WITH D & C: SHX1775

## 2020-09-29 HISTORY — DX: Dependence on supplemental oxygen: Z99.81

## 2020-09-29 LAB — TYPE AND SCREEN
ABO/RH(D): O POS
Antibody Screen: NEGATIVE

## 2020-09-29 LAB — GLUCOSE, CAPILLARY
Glucose-Capillary: 119 mg/dL — ABNORMAL HIGH (ref 70–99)
Glucose-Capillary: 107 mg/dL — ABNORMAL HIGH (ref 70–99)

## 2020-09-29 LAB — CBC
HCT: 38.4 % (ref 36.0–46.0)
Hemoglobin: 11.6 g/dL — ABNORMAL LOW (ref 12.0–15.0)
MCH: 30 pg (ref 26.0–34.0)
MCHC: 30.2 g/dL (ref 30.0–36.0)
MCV: 99.2 fL (ref 80.0–100.0)
Platelets: 196 10*3/uL (ref 150–400)
RBC: 3.87 MIL/uL (ref 3.87–5.11)
RDW: 14.4 % (ref 11.5–15.5)
WBC: 9.2 10*3/uL (ref 4.0–10.5)
nRBC: 0 % (ref 0.0–0.2)

## 2020-09-29 LAB — HEMOGLOBIN A1C
Hgb A1c MFr Bld: 6.6 % — ABNORMAL HIGH (ref 4.8–5.6)
Mean Plasma Glucose: 142.72 mg/dL

## 2020-09-29 LAB — RESP PANEL BY RT-PCR (FLU A&B, COVID) ARPGX2
Influenza A by PCR: NEGATIVE
Influenza B by PCR: NEGATIVE
SARS Coronavirus 2 by RT PCR: NEGATIVE

## 2020-09-29 LAB — BASIC METABOLIC PANEL
Anion gap: 10 (ref 5–15)
BUN: 13 mg/dL (ref 8–23)
CO2: 36 mmol/L — ABNORMAL HIGH (ref 22–32)
Calcium: 8.5 mg/dL — ABNORMAL LOW (ref 8.9–10.3)
Chloride: 96 mmol/L — ABNORMAL LOW (ref 98–111)
Creatinine, Ser: 2.21 mg/dL — ABNORMAL HIGH (ref 0.44–1.00)
GFR, Estimated: 23 mL/min — ABNORMAL LOW (ref >60)
Glucose, Bld: 122 mg/dL — ABNORMAL HIGH (ref 70–99)
Potassium: 3.3 mmol/L — ABNORMAL LOW (ref 3.5–5.1)
Sodium: 142 mmol/L (ref 135–145)

## 2020-09-29 LAB — ABO/RH: ABO/RH(D): O POS

## 2020-09-29 SURGERY — DILATATION AND CURETTAGE /HYSTEROSCOPY
Anesthesia: General | Site: Vagina

## 2020-09-29 MED ORDER — LACTATED RINGERS IV SOLN: INTRAVENOUS | Status: DC

## 2020-09-29 MED ORDER — LIDOCAINE HCL (PF) 2 % IJ SOLN
INTRAMUSCULAR | Status: AC
  Filled 2020-09-29: qty 5

## 2020-09-29 MED ORDER — EPHEDRINE SULFATE-NACL 50-0.9 MG/10ML-% IV SOSY
PREFILLED_SYRINGE | INTRAVENOUS | Status: DC | PRN
  Administered 2020-09-29: 10 mg via INTRAVENOUS
  Administered 2020-09-29: 5 mg via INTRAVENOUS

## 2020-09-29 MED ORDER — FENTANYL CITRATE (PF) 100 MCG/2ML IJ SOLN
INTRAMUSCULAR | Status: DC | PRN
  Administered 2020-09-29: 50 ug via INTRAVENOUS
  Administered 2020-09-29 (×2): 25 ug via INTRAVENOUS

## 2020-09-29 MED ORDER — ORAL CARE MOUTH RINSE: 15.0000 mL | Freq: Once | OROMUCOSAL | Status: AC

## 2020-09-29 MED ORDER — PROPOFOL 10 MG/ML IV BOLUS
INTRAVENOUS | Status: DC | PRN
  Administered 2020-09-29: 150 mg via INTRAVENOUS

## 2020-09-29 MED ORDER — OXYCODONE-ACETAMINOPHEN 5-325 MG PO TABS: 1.0000 | ORAL_TABLET | ORAL | 0 refills | Status: DC | PRN

## 2020-09-29 MED ORDER — PHENYLEPHRINE 40 MCG/ML (10ML) SYRINGE FOR IV PUSH (FOR BLOOD PRESSURE SUPPORT)
PREFILLED_SYRINGE | INTRAVENOUS | Status: AC
  Filled 2020-09-29: qty 10

## 2020-09-29 MED ORDER — LIDOCAINE 2% (20 MG/ML) 5 ML SYRINGE
INTRAMUSCULAR | Status: DC | PRN
  Administered 2020-09-29: 100 mg via INTRAVENOUS

## 2020-09-29 MED ORDER — PROPOFOL 10 MG/ML IV BOLUS
INTRAVENOUS | Status: AC
  Filled 2020-09-29: qty 20

## 2020-09-29 MED ORDER — SODIUM CHLORIDE 0.9 % IR SOLN
Status: DC | PRN
  Administered 2020-09-29: 3000 mL

## 2020-09-29 MED ORDER — ONDANSETRON HCL 4 MG/2ML IJ SOLN
INTRAMUSCULAR | Status: DC | PRN
  Administered 2020-09-29: 4 mg via INTRAVENOUS

## 2020-09-29 MED ORDER — SODIUM CHLORIDE 0.9 % IV SOLN: Freq: Once | INTRAVENOUS | Status: AC

## 2020-09-29 MED ORDER — FENTANYL CITRATE (PF) 100 MCG/2ML IJ SOLN
INTRAMUSCULAR | Status: AC
  Filled 2020-09-29: qty 2

## 2020-09-29 MED ORDER — EPHEDRINE 5 MG/ML INJ
INTRAVENOUS | Status: AC
  Filled 2020-09-29: qty 10

## 2020-09-29 MED ORDER — ONDANSETRON HCL 4 MG/2ML IJ SOLN
INTRAMUSCULAR | Status: AC
  Filled 2020-09-29: qty 2

## 2020-09-29 MED ORDER — LIDOCAINE HCL (PF) 1 % IJ SOLN
INTRAMUSCULAR | Status: DC | PRN
  Administered 2020-09-29: 10 mL

## 2020-09-29 MED ORDER — CHLORHEXIDINE GLUCONATE 0.12 % MT SOLN
15.0000 mL | Freq: Once | OROMUCOSAL | Status: AC
  Administered 2020-09-29: 15 mL via OROMUCOSAL

## 2020-09-29 MED ORDER — POVIDONE-IODINE 10 % EX SWAB
2.0000 "application " | Freq: Once | CUTANEOUS | Status: AC
  Administered 2020-09-29: 2 via TOPICAL

## 2020-09-29 MED ORDER — SODIUM CHLORIDE 0.9 % IV SOLN: INTRAVENOUS | Status: DC | PRN

## 2020-09-29 MED ORDER — PHENYLEPHRINE 40 MCG/ML (10ML) SYRINGE FOR IV PUSH (FOR BLOOD PRESSURE SUPPORT)
PREFILLED_SYRINGE | INTRAVENOUS | Status: DC | PRN
  Administered 2020-09-29 (×2): 80 ug via INTRAVENOUS

## 2020-09-29 SURGICAL SUPPLY — 18 items
BIPOLAR CUTTING LOOP 21FR (ELECTRODE)
CANISTER SUCT 3000ML PPV (MISCELLANEOUS) ×2 IMPLANT
CATH ROBINSON RED A/P 16FR (CATHETERS) IMPLANT
COVER WAND RF STERILE (DRAPES) ×2 IMPLANT
DILATOR CANAL MILEX (MISCELLANEOUS) IMPLANT
ELECT REM PT RETURN 9FT ADLT (ELECTROSURGICAL)
ELECTRODE REM PT RTRN 9FT ADLT (ELECTROSURGICAL) IMPLANT
GLOVE SURG SS PI 6.5 STRL IVOR (GLOVE) ×2 IMPLANT
GLOVE SURG SS PI 7.0 STRL IVOR (GLOVE) ×2 IMPLANT
GOWN STRL REUS W/TWL LRG LVL3 (GOWN DISPOSABLE) ×2 IMPLANT
KIT PROCEDURE FLUENT (KITS) ×2 IMPLANT
KIT TURNOVER CYSTO (KITS) ×2 IMPLANT
LOOP CUTTING BIPOLAR 21FR (ELECTRODE) IMPLANT
PACK VAGINAL MINOR WOMEN LF (CUSTOM PROCEDURE TRAY) ×2 IMPLANT
PAD OB MATERNITY 4.3X12.25 (PERSONAL CARE ITEMS) ×2 IMPLANT
PAD PREP 24X48 CUFFED NSTRL (MISCELLANEOUS) ×2 IMPLANT
TOWEL OR 17X26 10 PK STRL BLUE (TOWEL DISPOSABLE) ×2 IMPLANT
WATER STERILE IRR 500ML POUR (IV SOLUTION) ×2 IMPLANT

## 2020-09-29 NOTE — Progress Notes (Signed)
Dr. Elly Modena stated that PT may D/C without urinating. 09/29/2020 at 12:17pm via phone call.

## 2020-09-29 NOTE — Discharge Instructions (Signed)
Dilation and Curettage or Vacuum Curettage, Care After These instructions give you information about caring for yourself after your procedure. Your doctor may also give you more specific instructions. Call your doctor if you have any problems or questions after your procedure. Follow these instructions at home: Activity  Do not drive or use heavy machinery while taking prescription pain medicine.  For 24 hours after your procedure, avoid driving.  Take short walks often, followed by rest periods. Ask your doctor what activities are safe for you. After one or two days, you may be able to return to your normal activities.  Do not lift anything that is heavier than 10 lb (4.5 kg) until your doctor approves.  For at least 2 weeks, or as long as told by your doctor: ? Do not douche. ? Do not use tampons. ? Do not have sex. General instructions   Take over-the-counter and prescription medicines only as told by your doctor. This is very important if you take blood thinning medicine.  Do not take baths, swim, or use a hot tub until your doctor approves. Take showers instead of baths.  Wear compression stockings as told by your doctor.  It is up to you to get the results of your procedure. Ask your doctor when your results will be ready.  Keep all follow-up visits as told by your doctor. This is important. Contact a doctor if:  You have very bad cramps that get worse or do not get better with medicine.  You have very bad pain in your belly (abdomen).  You cannot drink fluids without throwing up (vomiting).  You get pain in a different part of the area between your belly and thighs (pelvis).  You have bad-smelling discharge from your vagina.  You have a rash. Get help right away if:  You are bleeding a lot from your vagina. A lot of bleeding means soaking more than one sanitary pad in an hour, for 2 hours in a row.  You have clumps of blood (blood clots) coming from your  vagina.  You have a fever or chills.  Your belly feels very tender or hard.  You have chest pain.  You have trouble breathing.  You cough up blood.  You feel dizzy.  You feel light-headed.  You pass out (faint).  You have pain in your neck or shoulder area. Summary  Take short walks often, followed by rest periods. Ask your doctor what activities are safe for you. After one or two days, you may be able to return to your normal activities.  Do not lift anything that is heavier than 10 lb (4.5 kg) until your doctor approves.  Do not take baths, swim, or use a hot tub until your doctor approves. Take showers instead of baths.  Contact your doctor if you have any symptoms of infection, like bad-smelling discharge from your vagina. This information is not intended to replace advice given to you by your health care provider. Make sure you discuss any questions you have with your health care provider. Document Revised: 09/08/2017 Document Reviewed: 06/13/2016 Elsevier Patient Education  2020 Elsevier Inc.  

## 2020-09-29 NOTE — Transfer of Care (Signed)
Immediate Anesthesia Transfer of Care Note  Patient: Jean Mcclain  Procedure(s) Performed: DILATATION AND CURETTAGE /HYSTEROSCOPY (N/A Vagina )  Patient Location: PACU  Anesthesia Type:General  Level of Consciousness: awake, alert , oriented and patient cooperative  Airway & Oxygen Therapy: Patient Spontanous Breathing and Patient connected to face mask  Post-op Assessment: Report given to RN and Post -op Vital signs reviewed and stable  Post vital signs: Reviewed and stable  Last Vitals:  Vitals Value Taken Time  BP    Temp    Pulse    Resp    SpO2      Last Pain:  Vitals:   09/29/20 0743  TempSrc:   PainSc: 8       Patients Stated Pain Goal: 7 (34/06/84 0335)  Complications: No complications documented.

## 2020-09-29 NOTE — Anesthesia Postprocedure Evaluation (Signed)
Anesthesia Post Note  Patient: Jean Mcclain  Procedure(s) Performed: DILATATION AND CURETTAGE /HYSTEROSCOPY (N/A Vagina )     Patient location during evaluation: PACU Anesthesia Type: General Level of consciousness: awake and alert and oriented Pain management: pain level controlled Vital Signs Assessment: post-procedure vital signs reviewed and stable Respiratory status: spontaneous breathing, nonlabored ventilation and respiratory function stable Cardiovascular status: blood pressure returned to baseline and stable Postop Assessment: no apparent nausea or vomiting Anesthetic complications: no   No complications documented.  Last Vitals:  Vitals:   09/29/20 0700 09/29/20 1047  BP: 132/63 (!) 103/49  Pulse: 75 77  Resp: 18 11  Temp: 37.2 C 36.5 C  SpO2: 97% 94%    Last Pain:  Vitals:   09/29/20 1047  TempSrc:   PainSc: 0-No pain                 Lige Lakeman A.

## 2020-09-29 NOTE — Op Note (Signed)
PREOPERATIVE DIAGNOSIS:  Postmenopausal uterine bleeding. POSTOPERATIVE DIAGNOSIS: The same PROCEDURE: Hysteroscopy, Dilation and Curettage. SURGEON:  Dr. Mora Bellman   INDICATIONS: 74 y.o. P0  here for scheduled surgery for postmenopausal vaginal bleeding.   Risks of surgery were discussed with the patient including but not limited to: bleeding which may require transfusion; infection which may require antibiotics; injury to uterus or surrounding organs; intrauterine scarring which may impair future fertility; need for additional procedures including laparotomy or laparoscopy; and other postoperative/anesthesia complications. Written informed consent was obtained.    FINDINGS:  A 10 week size uterus.  Atrophic endometrium and a single 1 centimeter central polyp visualized at near fundal region.  Normal ostia bilaterally.  ANESTHESIA:   General, paracervical block. INTRAVENOUS FLUIDS:  250 ml of LR FLUID DEFICITS:  60 ml of normal saline ESTIMATED BLOOD LOSS:  Less than 20 ml SPECIMENS: Endometrial curettings sent to pathology COMPLICATIONS:  None immediate.  PROCEDURE DETAILS:  The patient received intravenous antibiotics while in the preoperative area.  She was then taken to the operating room where general anesthesia was administered and was found to be adequate.  After an adequate timeout was performed, she was placed in the dorsal lithotomy position and examined; then prepped and draped in the sterile manner.   Her bladder was catheterized for an unmeasured amount of clear, yellow urine. A speculum was then placed in the patient's vagina and a single tooth tenaculum was applied to the anterior lip of the cervix.   A paracervical block using 10 ml of 0.5% Marcaine was administered.  The cervix was sounded to 10 cm and dilated manually with Hagar dilators to accommodate the 5 mm diagnostic hysteroscope.  Once the cervix was dilated, the hysteroscope was inserted under direct visualization  using normal saline as a suspension medium.  The uterine cavity was carefully examined, both ostia were recognized, and diffusely proliferative endometrium was noted.   After further careful visualization of the uterine cavity, the hysteroscope was removed under direct visualization.  A sharp curettage was then performed to obtain a moderate amount of endometrial curettings.  Post curettage hysteroscopy demonstrated a normal uterine cavity without the previously seen polyp. The tenaculum was removed from the anterior lip of the cervix and the vaginal speculum was removed after noting good hemostasis.  The patient tolerated the procedure well and was taken to the recovery area awake, extubated and in stable condition.

## 2020-09-29 NOTE — Anesthesia Procedure Notes (Signed)
Procedure Name: LMA Insertion Date/Time: 09/29/2020 10:15 AM Performed by: Claudia Desanctis, CRNA Pre-anesthesia Checklist: Emergency Drugs available, Patient identified, Suction available and Patient being monitored Patient Re-evaluated:Patient Re-evaluated prior to induction Oxygen Delivery Method: Circle system utilized Preoxygenation: Pre-oxygenation with 100% oxygen Induction Type: IV induction Ventilation: Mask ventilation without difficulty LMA: LMA with gastric port inserted LMA Size: 4.0 Number of attempts: 1 Placement Confirmation: positive ETCO2 and breath sounds checked- equal and bilateral Tube secured with: Tape Dental Injury: Teeth and Oropharynx as per pre-operative assessment

## 2020-09-30 ENCOUNTER — Encounter (HOSPITAL_COMMUNITY): Payer: Self-pay | Admitting: Obstetrics and Gynecology

## 2020-09-30 ENCOUNTER — Other Ambulatory Visit: Payer: Self-pay

## 2020-09-30 LAB — SURGICAL PATHOLOGY

## 2020-09-30 NOTE — Patient Outreach (Signed)
  [facility]) Care Management Chronic Special Needs Program    09/30/2020  Name: Jean Mcclain, DOB: [birth year]  MRN: [id number]   Jean Mcclain is enrolled in a chronic special needs plan for Diabetes.   [facility] [employer] Care Management Team has assumed care and services for this member. Case closed by [place] RN, [facility], [facility] [phone]

## 2020-10-05 ENCOUNTER — Telehealth: Payer: Self-pay | Admitting: *Deleted

## 2020-10-05 NOTE — Telephone Encounter (Addendum)
-----   Message from Osborne Oman, MD sent at 10/01/2020  4:21 PM EST ----- Benign endometrial polyp seen, no precancerous or cancerous cells. If bleeding continues, she may need further procedures or evaluation.   Please call to inform patient of results and recommendations.  12/27  1157  Called pt and the call was answered by her nurse @ [facility] facility who stated that pt was @ dialysis appt. She stated that pt will return today about 4 pm. I stated that I am calling with test results and a nurse will call her back at another time.

## 2020-10-06 DIAGNOSIS — I5033 Acute on chronic diastolic (congestive) heart failure: Secondary | ICD-10-CM | POA: Diagnosis not present

## 2020-10-06 DIAGNOSIS — J9602 Acute respiratory failure with hypercapnia: Secondary | ICD-10-CM | POA: Diagnosis not present

## 2020-10-06 NOTE — Telephone Encounter (Signed)
Called patient to give results. Received a voicemail. LM that office is trying to reach patient and please call the office at [phone].

## 2020-10-06 NOTE — Telephone Encounter (Signed)
Jean Mcclain, the patient scheduler called in to ask if patient has a follow up appointment with the [facility]. Returned [name]'s call that patient does not have a follow up appointment at this time.   Called the home phone listed on the chart and received a Nurse Manager who is not at work. She gave me the number of [phone] to call to reach patient. After a few transfers, was able to speak with patient to give her the results. She reports she has not been bleeding since before the surgery.   She will call the office if she has any further bleeding or concerns to come back in to see Dr. Harolyn Rutherford for evaluation.

## 2020-10-07 DIAGNOSIS — N186 End stage renal disease: Secondary | ICD-10-CM | POA: Diagnosis not present

## 2020-10-07 DIAGNOSIS — E119 Type 2 diabetes mellitus without complications: Secondary | ICD-10-CM | POA: Diagnosis not present

## 2020-10-07 DIAGNOSIS — L89623 Pressure ulcer of left heel, stage 3: Secondary | ICD-10-CM | POA: Diagnosis not present

## 2020-10-07 DIAGNOSIS — L97521 Non-pressure chronic ulcer of other part of left foot limited to breakdown of skin: Secondary | ICD-10-CM | POA: Diagnosis not present

## 2020-10-09 DIAGNOSIS — N186 End stage renal disease: Secondary | ICD-10-CM | POA: Diagnosis not present

## 2020-10-09 DIAGNOSIS — E1022 Type 1 diabetes mellitus with diabetic chronic kidney disease: Secondary | ICD-10-CM | POA: Diagnosis not present

## 2020-10-09 DIAGNOSIS — Z992 Dependence on renal dialysis: Secondary | ICD-10-CM | POA: Diagnosis not present

## 2020-10-12 DIAGNOSIS — S91302D Unspecified open wound, left foot, subsequent encounter: Secondary | ICD-10-CM | POA: Diagnosis not present

## 2020-10-12 DIAGNOSIS — S322XXD Fracture of coccyx, subsequent encounter for fracture with routine healing: Secondary | ICD-10-CM | POA: Diagnosis not present

## 2020-10-12 DIAGNOSIS — D509 Iron deficiency anemia, unspecified: Secondary | ICD-10-CM | POA: Diagnosis not present

## 2020-10-12 DIAGNOSIS — E1169 Type 2 diabetes mellitus with other specified complication: Secondary | ICD-10-CM | POA: Diagnosis not present

## 2020-10-12 DIAGNOSIS — Z992 Dependence on renal dialysis: Secondary | ICD-10-CM | POA: Diagnosis not present

## 2020-10-12 DIAGNOSIS — S82892A Other fracture of left lower leg, initial encounter for closed fracture: Secondary | ICD-10-CM | POA: Diagnosis not present

## 2020-10-12 DIAGNOSIS — E785 Hyperlipidemia, unspecified: Secondary | ICD-10-CM | POA: Diagnosis not present

## 2020-10-12 DIAGNOSIS — N186 End stage renal disease: Secondary | ICD-10-CM | POA: Diagnosis not present

## 2020-10-12 DIAGNOSIS — N939 Abnormal uterine and vaginal bleeding, unspecified: Secondary | ICD-10-CM | POA: Diagnosis not present

## 2020-10-12 DIAGNOSIS — E876 Hypokalemia: Secondary | ICD-10-CM | POA: Diagnosis not present

## 2020-10-19 DIAGNOSIS — U071 COVID-19: Secondary | ICD-10-CM | POA: Diagnosis not present

## 2020-10-19 DIAGNOSIS — Z20828 Contact with and (suspected) exposure to other viral communicable diseases: Secondary | ICD-10-CM | POA: Diagnosis not present

## 2020-10-21 DIAGNOSIS — Z86718 Personal history of other venous thrombosis and embolism: Secondary | ICD-10-CM | POA: Diagnosis not present

## 2020-10-21 DIAGNOSIS — R7989 Other specified abnormal findings of blood chemistry: Secondary | ICD-10-CM | POA: Diagnosis not present

## 2020-10-21 DIAGNOSIS — Z992 Dependence on renal dialysis: Secondary | ICD-10-CM | POA: Diagnosis not present

## 2020-10-21 DIAGNOSIS — N186 End stage renal disease: Secondary | ICD-10-CM | POA: Diagnosis not present

## 2020-10-21 DIAGNOSIS — U071 COVID-19: Secondary | ICD-10-CM | POA: Diagnosis not present

## 2020-10-21 DIAGNOSIS — E1169 Type 2 diabetes mellitus with other specified complication: Secondary | ICD-10-CM | POA: Diagnosis not present

## 2020-10-21 DIAGNOSIS — E785 Hyperlipidemia, unspecified: Secondary | ICD-10-CM | POA: Diagnosis not present

## 2020-10-21 DIAGNOSIS — J449 Chronic obstructive pulmonary disease, unspecified: Secondary | ICD-10-CM | POA: Diagnosis not present

## 2020-10-27 DIAGNOSIS — U071 COVID-19: Secondary | ICD-10-CM | POA: Diagnosis not present

## 2020-10-28 DIAGNOSIS — Z03818 Encounter for observation for suspected exposure to other biological agents ruled out: Secondary | ICD-10-CM | POA: Diagnosis not present

## 2020-10-29 DIAGNOSIS — U071 COVID-19: Secondary | ICD-10-CM | POA: Diagnosis not present

## 2020-11-02 DIAGNOSIS — E785 Hyperlipidemia, unspecified: Secondary | ICD-10-CM | POA: Diagnosis not present

## 2020-11-02 DIAGNOSIS — B373 Candidiasis of vulva and vagina: Secondary | ICD-10-CM | POA: Diagnosis not present

## 2020-11-02 DIAGNOSIS — Z992 Dependence on renal dialysis: Secondary | ICD-10-CM | POA: Diagnosis not present

## 2020-11-02 DIAGNOSIS — N186 End stage renal disease: Secondary | ICD-10-CM | POA: Diagnosis not present

## 2020-11-02 DIAGNOSIS — E1169 Type 2 diabetes mellitus with other specified complication: Secondary | ICD-10-CM | POA: Diagnosis not present

## 2020-11-06 DIAGNOSIS — I5033 Acute on chronic diastolic (congestive) heart failure: Secondary | ICD-10-CM | POA: Diagnosis not present

## 2020-11-06 DIAGNOSIS — J9602 Acute respiratory failure with hypercapnia: Secondary | ICD-10-CM | POA: Diagnosis not present

## 2020-11-09 DIAGNOSIS — E1022 Type 1 diabetes mellitus with diabetic chronic kidney disease: Secondary | ICD-10-CM | POA: Diagnosis not present

## 2020-11-09 DIAGNOSIS — Z992 Dependence on renal dialysis: Secondary | ICD-10-CM | POA: Diagnosis not present

## 2020-11-09 DIAGNOSIS — N186 End stage renal disease: Secondary | ICD-10-CM | POA: Diagnosis not present

## 2020-11-11 DIAGNOSIS — D509 Iron deficiency anemia, unspecified: Secondary | ICD-10-CM | POA: Diagnosis not present

## 2020-11-11 DIAGNOSIS — E876 Hypokalemia: Secondary | ICD-10-CM | POA: Diagnosis not present

## 2020-11-11 DIAGNOSIS — N186 End stage renal disease: Secondary | ICD-10-CM | POA: Diagnosis not present

## 2020-11-11 DIAGNOSIS — Z992 Dependence on renal dialysis: Secondary | ICD-10-CM | POA: Diagnosis not present

## 2020-11-11 DIAGNOSIS — D631 Anemia in chronic kidney disease: Secondary | ICD-10-CM | POA: Diagnosis not present

## 2020-11-12 ENCOUNTER — Ambulatory Visit: Payer: HMO | Admitting: Sports Medicine

## 2020-11-12 DIAGNOSIS — H2512 Age-related nuclear cataract, left eye: Secondary | ICD-10-CM | POA: Diagnosis not present

## 2020-11-12 DIAGNOSIS — H35033 Hypertensive retinopathy, bilateral: Secondary | ICD-10-CM | POA: Diagnosis not present

## 2020-11-12 DIAGNOSIS — Z961 Presence of intraocular lens: Secondary | ICD-10-CM | POA: Diagnosis not present

## 2020-11-12 DIAGNOSIS — E113293 Type 2 diabetes mellitus with mild nonproliferative diabetic retinopathy without macular edema, bilateral: Secondary | ICD-10-CM | POA: Diagnosis not present

## 2020-11-17 DIAGNOSIS — R110 Nausea: Secondary | ICD-10-CM | POA: Diagnosis not present

## 2020-11-17 DIAGNOSIS — F419 Anxiety disorder, unspecified: Secondary | ICD-10-CM | POA: Diagnosis not present

## 2020-11-17 DIAGNOSIS — N186 End stage renal disease: Secondary | ICD-10-CM | POA: Diagnosis not present

## 2020-12-07 DIAGNOSIS — J449 Chronic obstructive pulmonary disease, unspecified: Secondary | ICD-10-CM | POA: Diagnosis not present

## 2020-12-07 DIAGNOSIS — S82892A Other fracture of left lower leg, initial encounter for closed fracture: Secondary | ICD-10-CM | POA: Diagnosis not present

## 2020-12-07 DIAGNOSIS — E1022 Type 1 diabetes mellitus with diabetic chronic kidney disease: Secondary | ICD-10-CM | POA: Diagnosis not present

## 2020-12-07 DIAGNOSIS — N186 End stage renal disease: Secondary | ICD-10-CM | POA: Diagnosis not present

## 2020-12-07 DIAGNOSIS — E1169 Type 2 diabetes mellitus with other specified complication: Secondary | ICD-10-CM | POA: Diagnosis not present

## 2020-12-07 DIAGNOSIS — E785 Hyperlipidemia, unspecified: Secondary | ICD-10-CM | POA: Diagnosis not present

## 2020-12-07 DIAGNOSIS — Z992 Dependence on renal dialysis: Secondary | ICD-10-CM | POA: Diagnosis not present

## 2020-12-07 DIAGNOSIS — F419 Anxiety disorder, unspecified: Secondary | ICD-10-CM | POA: Diagnosis not present

## 2020-12-09 DIAGNOSIS — E876 Hypokalemia: Secondary | ICD-10-CM | POA: Diagnosis not present

## 2020-12-09 DIAGNOSIS — N186 End stage renal disease: Secondary | ICD-10-CM | POA: Diagnosis not present

## 2020-12-09 DIAGNOSIS — Z992 Dependence on renal dialysis: Secondary | ICD-10-CM | POA: Diagnosis not present

## 2020-12-10 DIAGNOSIS — Z993 Dependence on wheelchair: Secondary | ICD-10-CM | POA: Diagnosis not present

## 2020-12-10 DIAGNOSIS — E1122 Type 2 diabetes mellitus with diabetic chronic kidney disease: Secondary | ICD-10-CM | POA: Diagnosis not present

## 2020-12-10 DIAGNOSIS — M6281 Muscle weakness (generalized): Secondary | ICD-10-CM | POA: Diagnosis not present

## 2020-12-10 DIAGNOSIS — Z992 Dependence on renal dialysis: Secondary | ICD-10-CM | POA: Diagnosis not present

## 2020-12-10 DIAGNOSIS — Z7951 Long term (current) use of inhaled steroids: Secondary | ICD-10-CM | POA: Diagnosis not present

## 2020-12-10 DIAGNOSIS — Z6835 Body mass index (BMI) 35.0-35.9, adult: Secondary | ICD-10-CM | POA: Diagnosis not present

## 2020-12-10 DIAGNOSIS — Z7901 Long term (current) use of anticoagulants: Secondary | ICD-10-CM | POA: Diagnosis not present

## 2020-12-10 DIAGNOSIS — U071 COVID-19: Secondary | ICD-10-CM | POA: Diagnosis not present

## 2020-12-10 DIAGNOSIS — Z9981 Dependence on supplemental oxygen: Secondary | ICD-10-CM | POA: Diagnosis not present

## 2020-12-10 DIAGNOSIS — Z86718 Personal history of other venous thrombosis and embolism: Secondary | ICD-10-CM | POA: Diagnosis not present

## 2020-12-10 DIAGNOSIS — E785 Hyperlipidemia, unspecified: Secondary | ICD-10-CM | POA: Diagnosis not present

## 2020-12-10 DIAGNOSIS — S82422D Displaced transverse fracture of shaft of left fibula, subsequent encounter for closed fracture with routine healing: Secondary | ICD-10-CM | POA: Diagnosis not present

## 2020-12-10 DIAGNOSIS — I120 Hypertensive chronic kidney disease with stage 5 chronic kidney disease or end stage renal disease: Secondary | ICD-10-CM | POA: Diagnosis not present

## 2020-12-10 DIAGNOSIS — M19072 Primary osteoarthritis, left ankle and foot: Secondary | ICD-10-CM | POA: Diagnosis not present

## 2020-12-10 DIAGNOSIS — Z9181 History of falling: Secondary | ICD-10-CM | POA: Diagnosis not present

## 2020-12-10 DIAGNOSIS — Z794 Long term (current) use of insulin: Secondary | ICD-10-CM | POA: Diagnosis not present

## 2020-12-10 DIAGNOSIS — N186 End stage renal disease: Secondary | ICD-10-CM | POA: Diagnosis not present

## 2020-12-14 DIAGNOSIS — N186 End stage renal disease: Secondary | ICD-10-CM | POA: Diagnosis not present

## 2020-12-14 DIAGNOSIS — Z992 Dependence on renal dialysis: Secondary | ICD-10-CM | POA: Diagnosis not present

## 2020-12-14 DIAGNOSIS — D509 Iron deficiency anemia, unspecified: Secondary | ICD-10-CM | POA: Diagnosis not present

## 2020-12-15 DIAGNOSIS — G4733 Obstructive sleep apnea (adult) (pediatric): Secondary | ICD-10-CM | POA: Diagnosis not present

## 2020-12-15 DIAGNOSIS — Z7901 Long term (current) use of anticoagulants: Secondary | ICD-10-CM | POA: Diagnosis not present

## 2020-12-15 DIAGNOSIS — Z992 Dependence on renal dialysis: Secondary | ICD-10-CM | POA: Diagnosis not present

## 2020-12-15 DIAGNOSIS — Z993 Dependence on wheelchair: Secondary | ICD-10-CM | POA: Diagnosis not present

## 2020-12-15 DIAGNOSIS — E1142 Type 2 diabetes mellitus with diabetic polyneuropathy: Secondary | ICD-10-CM | POA: Diagnosis not present

## 2020-12-15 DIAGNOSIS — N186 End stage renal disease: Secondary | ICD-10-CM | POA: Diagnosis not present

## 2020-12-15 DIAGNOSIS — Z86718 Personal history of other venous thrombosis and embolism: Secondary | ICD-10-CM | POA: Diagnosis not present

## 2020-12-15 DIAGNOSIS — G20 Parkinson's disease: Secondary | ICD-10-CM | POA: Diagnosis not present

## 2020-12-15 DIAGNOSIS — Z794 Long term (current) use of insulin: Secondary | ICD-10-CM | POA: Diagnosis not present

## 2020-12-15 DIAGNOSIS — E1152 Type 2 diabetes mellitus with diabetic peripheral angiopathy with gangrene: Secondary | ICD-10-CM | POA: Diagnosis not present

## 2020-12-15 DIAGNOSIS — M1A39X0 Chronic gout due to renal impairment, multiple sites, without tophus (tophi): Secondary | ICD-10-CM | POA: Diagnosis not present

## 2020-12-17 DIAGNOSIS — Z6835 Body mass index (BMI) 35.0-35.9, adult: Secondary | ICD-10-CM | POA: Diagnosis not present

## 2020-12-17 DIAGNOSIS — U071 COVID-19: Secondary | ICD-10-CM | POA: Diagnosis not present

## 2020-12-17 DIAGNOSIS — Z992 Dependence on renal dialysis: Secondary | ICD-10-CM | POA: Diagnosis not present

## 2020-12-17 DIAGNOSIS — E785 Hyperlipidemia, unspecified: Secondary | ICD-10-CM | POA: Diagnosis not present

## 2020-12-17 DIAGNOSIS — Z7951 Long term (current) use of inhaled steroids: Secondary | ICD-10-CM | POA: Diagnosis not present

## 2020-12-17 DIAGNOSIS — Z7901 Long term (current) use of anticoagulants: Secondary | ICD-10-CM | POA: Diagnosis not present

## 2020-12-17 DIAGNOSIS — Z9981 Dependence on supplemental oxygen: Secondary | ICD-10-CM | POA: Diagnosis not present

## 2020-12-17 DIAGNOSIS — Z9181 History of falling: Secondary | ICD-10-CM | POA: Diagnosis not present

## 2020-12-17 DIAGNOSIS — Z86718 Personal history of other venous thrombosis and embolism: Secondary | ICD-10-CM | POA: Diagnosis not present

## 2020-12-17 DIAGNOSIS — Z794 Long term (current) use of insulin: Secondary | ICD-10-CM | POA: Diagnosis not present

## 2020-12-17 DIAGNOSIS — Z993 Dependence on wheelchair: Secondary | ICD-10-CM | POA: Diagnosis not present

## 2020-12-17 DIAGNOSIS — M6281 Muscle weakness (generalized): Secondary | ICD-10-CM | POA: Diagnosis not present

## 2020-12-17 DIAGNOSIS — I120 Hypertensive chronic kidney disease with stage 5 chronic kidney disease or end stage renal disease: Secondary | ICD-10-CM | POA: Diagnosis not present

## 2020-12-17 DIAGNOSIS — S82422D Displaced transverse fracture of shaft of left fibula, subsequent encounter for closed fracture with routine healing: Secondary | ICD-10-CM | POA: Diagnosis not present

## 2020-12-17 DIAGNOSIS — M19072 Primary osteoarthritis, left ankle and foot: Secondary | ICD-10-CM | POA: Diagnosis not present

## 2020-12-17 DIAGNOSIS — E1122 Type 2 diabetes mellitus with diabetic chronic kidney disease: Secondary | ICD-10-CM | POA: Diagnosis not present

## 2020-12-17 DIAGNOSIS — N186 End stage renal disease: Secondary | ICD-10-CM | POA: Diagnosis not present

## 2020-12-19 DIAGNOSIS — Z7951 Long term (current) use of inhaled steroids: Secondary | ICD-10-CM | POA: Diagnosis not present

## 2020-12-19 DIAGNOSIS — I120 Hypertensive chronic kidney disease with stage 5 chronic kidney disease or end stage renal disease: Secondary | ICD-10-CM | POA: Diagnosis not present

## 2020-12-19 DIAGNOSIS — Z993 Dependence on wheelchair: Secondary | ICD-10-CM | POA: Diagnosis not present

## 2020-12-19 DIAGNOSIS — E1122 Type 2 diabetes mellitus with diabetic chronic kidney disease: Secondary | ICD-10-CM | POA: Diagnosis not present

## 2020-12-19 DIAGNOSIS — Z9181 History of falling: Secondary | ICD-10-CM | POA: Diagnosis not present

## 2020-12-19 DIAGNOSIS — M19072 Primary osteoarthritis, left ankle and foot: Secondary | ICD-10-CM | POA: Diagnosis not present

## 2020-12-19 DIAGNOSIS — Z6835 Body mass index (BMI) 35.0-35.9, adult: Secondary | ICD-10-CM | POA: Diagnosis not present

## 2020-12-19 DIAGNOSIS — Z9981 Dependence on supplemental oxygen: Secondary | ICD-10-CM | POA: Diagnosis not present

## 2020-12-19 DIAGNOSIS — E785 Hyperlipidemia, unspecified: Secondary | ICD-10-CM | POA: Diagnosis not present

## 2020-12-19 DIAGNOSIS — Z7901 Long term (current) use of anticoagulants: Secondary | ICD-10-CM | POA: Diagnosis not present

## 2020-12-19 DIAGNOSIS — Z86718 Personal history of other venous thrombosis and embolism: Secondary | ICD-10-CM | POA: Diagnosis not present

## 2020-12-19 DIAGNOSIS — Z794 Long term (current) use of insulin: Secondary | ICD-10-CM | POA: Diagnosis not present

## 2020-12-19 DIAGNOSIS — S82422D Displaced transverse fracture of shaft of left fibula, subsequent encounter for closed fracture with routine healing: Secondary | ICD-10-CM | POA: Diagnosis not present

## 2020-12-19 DIAGNOSIS — U071 COVID-19: Secondary | ICD-10-CM | POA: Diagnosis not present

## 2020-12-19 DIAGNOSIS — M6281 Muscle weakness (generalized): Secondary | ICD-10-CM | POA: Diagnosis not present

## 2020-12-19 DIAGNOSIS — Z992 Dependence on renal dialysis: Secondary | ICD-10-CM | POA: Diagnosis not present

## 2020-12-19 DIAGNOSIS — N186 End stage renal disease: Secondary | ICD-10-CM | POA: Diagnosis not present

## 2020-12-21 DIAGNOSIS — E876 Hypokalemia: Secondary | ICD-10-CM | POA: Diagnosis not present

## 2020-12-21 DIAGNOSIS — N186 End stage renal disease: Secondary | ICD-10-CM | POA: Diagnosis not present

## 2020-12-21 DIAGNOSIS — Z992 Dependence on renal dialysis: Secondary | ICD-10-CM | POA: Diagnosis not present

## 2020-12-22 DIAGNOSIS — E1122 Type 2 diabetes mellitus with diabetic chronic kidney disease: Secondary | ICD-10-CM | POA: Diagnosis not present

## 2020-12-22 DIAGNOSIS — N186 End stage renal disease: Secondary | ICD-10-CM | POA: Diagnosis not present

## 2020-12-22 DIAGNOSIS — E785 Hyperlipidemia, unspecified: Secondary | ICD-10-CM | POA: Diagnosis not present

## 2020-12-22 DIAGNOSIS — Z992 Dependence on renal dialysis: Secondary | ICD-10-CM | POA: Diagnosis not present

## 2020-12-22 DIAGNOSIS — J9621 Acute and chronic respiratory failure with hypoxia: Secondary | ICD-10-CM | POA: Diagnosis not present

## 2020-12-22 DIAGNOSIS — I10 Essential (primary) hypertension: Secondary | ICD-10-CM | POA: Diagnosis not present

## 2020-12-28 DIAGNOSIS — Z992 Dependence on renal dialysis: Secondary | ICD-10-CM | POA: Diagnosis not present

## 2020-12-28 DIAGNOSIS — E876 Hypokalemia: Secondary | ICD-10-CM | POA: Diagnosis not present

## 2020-12-28 DIAGNOSIS — N2581 Secondary hyperparathyroidism of renal origin: Secondary | ICD-10-CM | POA: Diagnosis not present

## 2020-12-28 DIAGNOSIS — N186 End stage renal disease: Secondary | ICD-10-CM | POA: Diagnosis not present

## 2020-12-28 DIAGNOSIS — D631 Anemia in chronic kidney disease: Secondary | ICD-10-CM | POA: Diagnosis not present

## 2020-12-29 DIAGNOSIS — Z7951 Long term (current) use of inhaled steroids: Secondary | ICD-10-CM | POA: Diagnosis not present

## 2020-12-29 DIAGNOSIS — Z794 Long term (current) use of insulin: Secondary | ICD-10-CM | POA: Diagnosis not present

## 2020-12-29 DIAGNOSIS — E1122 Type 2 diabetes mellitus with diabetic chronic kidney disease: Secondary | ICD-10-CM | POA: Diagnosis not present

## 2020-12-29 DIAGNOSIS — Z86718 Personal history of other venous thrombosis and embolism: Secondary | ICD-10-CM | POA: Diagnosis not present

## 2020-12-29 DIAGNOSIS — E785 Hyperlipidemia, unspecified: Secondary | ICD-10-CM | POA: Diagnosis not present

## 2020-12-29 DIAGNOSIS — U071 COVID-19: Secondary | ICD-10-CM | POA: Diagnosis not present

## 2020-12-29 DIAGNOSIS — Z6835 Body mass index (BMI) 35.0-35.9, adult: Secondary | ICD-10-CM | POA: Diagnosis not present

## 2020-12-29 DIAGNOSIS — Z7901 Long term (current) use of anticoagulants: Secondary | ICD-10-CM | POA: Diagnosis not present

## 2020-12-29 DIAGNOSIS — Z9181 History of falling: Secondary | ICD-10-CM | POA: Diagnosis not present

## 2020-12-29 DIAGNOSIS — M19072 Primary osteoarthritis, left ankle and foot: Secondary | ICD-10-CM | POA: Diagnosis not present

## 2020-12-29 DIAGNOSIS — Z9981 Dependence on supplemental oxygen: Secondary | ICD-10-CM | POA: Diagnosis not present

## 2020-12-29 DIAGNOSIS — S82422D Displaced transverse fracture of shaft of left fibula, subsequent encounter for closed fracture with routine healing: Secondary | ICD-10-CM | POA: Diagnosis not present

## 2020-12-29 DIAGNOSIS — Z992 Dependence on renal dialysis: Secondary | ICD-10-CM | POA: Diagnosis not present

## 2020-12-29 DIAGNOSIS — N186 End stage renal disease: Secondary | ICD-10-CM | POA: Diagnosis not present

## 2020-12-29 DIAGNOSIS — Z993 Dependence on wheelchair: Secondary | ICD-10-CM | POA: Diagnosis not present

## 2020-12-29 DIAGNOSIS — I120 Hypertensive chronic kidney disease with stage 5 chronic kidney disease or end stage renal disease: Secondary | ICD-10-CM | POA: Diagnosis not present

## 2020-12-29 DIAGNOSIS — M6281 Muscle weakness (generalized): Secondary | ICD-10-CM | POA: Diagnosis not present

## 2021-01-04 DIAGNOSIS — N2581 Secondary hyperparathyroidism of renal origin: Secondary | ICD-10-CM | POA: Diagnosis not present

## 2021-01-04 DIAGNOSIS — N186 End stage renal disease: Secondary | ICD-10-CM | POA: Diagnosis not present

## 2021-01-04 DIAGNOSIS — Z992 Dependence on renal dialysis: Secondary | ICD-10-CM | POA: Diagnosis not present

## 2021-01-04 DIAGNOSIS — E1122 Type 2 diabetes mellitus with diabetic chronic kidney disease: Secondary | ICD-10-CM | POA: Diagnosis not present

## 2021-01-04 DIAGNOSIS — I120 Hypertensive chronic kidney disease with stage 5 chronic kidney disease or end stage renal disease: Secondary | ICD-10-CM | POA: Diagnosis not present

## 2021-01-05 DIAGNOSIS — G20 Parkinson's disease: Secondary | ICD-10-CM | POA: Diagnosis not present

## 2021-01-05 DIAGNOSIS — M1611 Unilateral primary osteoarthritis, right hip: Secondary | ICD-10-CM | POA: Diagnosis not present

## 2021-01-05 DIAGNOSIS — Z7901 Long term (current) use of anticoagulants: Secondary | ICD-10-CM | POA: Diagnosis not present

## 2021-01-05 DIAGNOSIS — Z993 Dependence on wheelchair: Secondary | ICD-10-CM | POA: Diagnosis not present

## 2021-01-05 DIAGNOSIS — Z794 Long term (current) use of insulin: Secondary | ICD-10-CM | POA: Diagnosis not present

## 2021-01-05 DIAGNOSIS — E1152 Type 2 diabetes mellitus with diabetic peripheral angiopathy with gangrene: Secondary | ICD-10-CM | POA: Diagnosis not present

## 2021-01-05 DIAGNOSIS — E1142 Type 2 diabetes mellitus with diabetic polyneuropathy: Secondary | ICD-10-CM | POA: Diagnosis not present

## 2021-01-06 DIAGNOSIS — U071 COVID-19: Secondary | ICD-10-CM | POA: Diagnosis not present

## 2021-01-06 DIAGNOSIS — M6281 Muscle weakness (generalized): Secondary | ICD-10-CM | POA: Diagnosis not present

## 2021-01-06 DIAGNOSIS — N186 End stage renal disease: Secondary | ICD-10-CM | POA: Diagnosis not present

## 2021-01-06 DIAGNOSIS — Z992 Dependence on renal dialysis: Secondary | ICD-10-CM | POA: Diagnosis not present

## 2021-01-06 DIAGNOSIS — Z993 Dependence on wheelchair: Secondary | ICD-10-CM | POA: Diagnosis not present

## 2021-01-06 DIAGNOSIS — Z6835 Body mass index (BMI) 35.0-35.9, adult: Secondary | ICD-10-CM | POA: Diagnosis not present

## 2021-01-06 DIAGNOSIS — Z794 Long term (current) use of insulin: Secondary | ICD-10-CM | POA: Diagnosis not present

## 2021-01-06 DIAGNOSIS — Z86718 Personal history of other venous thrombosis and embolism: Secondary | ICD-10-CM | POA: Diagnosis not present

## 2021-01-06 DIAGNOSIS — I120 Hypertensive chronic kidney disease with stage 5 chronic kidney disease or end stage renal disease: Secondary | ICD-10-CM | POA: Diagnosis not present

## 2021-01-06 DIAGNOSIS — Z9181 History of falling: Secondary | ICD-10-CM | POA: Diagnosis not present

## 2021-01-06 DIAGNOSIS — Z7901 Long term (current) use of anticoagulants: Secondary | ICD-10-CM | POA: Diagnosis not present

## 2021-01-06 DIAGNOSIS — E785 Hyperlipidemia, unspecified: Secondary | ICD-10-CM | POA: Diagnosis not present

## 2021-01-06 DIAGNOSIS — M19072 Primary osteoarthritis, left ankle and foot: Secondary | ICD-10-CM | POA: Diagnosis not present

## 2021-01-06 DIAGNOSIS — Z9981 Dependence on supplemental oxygen: Secondary | ICD-10-CM | POA: Diagnosis not present

## 2021-01-06 DIAGNOSIS — Z7951 Long term (current) use of inhaled steroids: Secondary | ICD-10-CM | POA: Diagnosis not present

## 2021-01-06 DIAGNOSIS — E1122 Type 2 diabetes mellitus with diabetic chronic kidney disease: Secondary | ICD-10-CM | POA: Diagnosis not present

## 2021-01-06 DIAGNOSIS — S82422D Displaced transverse fracture of shaft of left fibula, subsequent encounter for closed fracture with routine healing: Secondary | ICD-10-CM | POA: Diagnosis not present

## 2021-01-07 DIAGNOSIS — E1022 Type 1 diabetes mellitus with diabetic chronic kidney disease: Secondary | ICD-10-CM | POA: Diagnosis not present

## 2021-01-07 DIAGNOSIS — N186 End stage renal disease: Secondary | ICD-10-CM | POA: Diagnosis not present

## 2021-01-07 DIAGNOSIS — Z992 Dependence on renal dialysis: Secondary | ICD-10-CM | POA: Diagnosis not present

## 2021-01-08 DIAGNOSIS — N186 End stage renal disease: Secondary | ICD-10-CM | POA: Diagnosis not present

## 2021-01-08 DIAGNOSIS — N2581 Secondary hyperparathyroidism of renal origin: Secondary | ICD-10-CM | POA: Diagnosis not present

## 2021-01-08 DIAGNOSIS — Z992 Dependence on renal dialysis: Secondary | ICD-10-CM | POA: Diagnosis not present

## 2021-01-08 DIAGNOSIS — Z794 Long term (current) use of insulin: Secondary | ICD-10-CM | POA: Diagnosis not present

## 2021-01-08 DIAGNOSIS — E119 Type 2 diabetes mellitus without complications: Secondary | ICD-10-CM | POA: Diagnosis not present

## 2021-01-11 DIAGNOSIS — E876 Hypokalemia: Secondary | ICD-10-CM | POA: Diagnosis not present

## 2021-01-11 DIAGNOSIS — N2581 Secondary hyperparathyroidism of renal origin: Secondary | ICD-10-CM | POA: Diagnosis not present

## 2021-01-11 DIAGNOSIS — Z992 Dependence on renal dialysis: Secondary | ICD-10-CM | POA: Diagnosis not present

## 2021-01-11 DIAGNOSIS — N186 End stage renal disease: Secondary | ICD-10-CM | POA: Diagnosis not present

## 2021-01-14 ENCOUNTER — Ambulatory Visit: Payer: Self-pay

## 2021-01-14 ENCOUNTER — Ambulatory Visit (INDEPENDENT_AMBULATORY_CARE_PROVIDER_SITE_OTHER): Payer: HMO | Admitting: Physician Assistant

## 2021-01-14 ENCOUNTER — Encounter: Payer: Self-pay | Admitting: Orthopedic Surgery

## 2021-01-14 DIAGNOSIS — Z6835 Body mass index (BMI) 35.0-35.9, adult: Secondary | ICD-10-CM | POA: Diagnosis not present

## 2021-01-14 DIAGNOSIS — Z9981 Dependence on supplemental oxygen: Secondary | ICD-10-CM | POA: Diagnosis not present

## 2021-01-14 DIAGNOSIS — Z992 Dependence on renal dialysis: Secondary | ICD-10-CM | POA: Diagnosis not present

## 2021-01-14 DIAGNOSIS — N186 End stage renal disease: Secondary | ICD-10-CM | POA: Diagnosis not present

## 2021-01-14 DIAGNOSIS — U071 COVID-19: Secondary | ICD-10-CM | POA: Diagnosis not present

## 2021-01-14 DIAGNOSIS — S82422D Displaced transverse fracture of shaft of left fibula, subsequent encounter for closed fracture with routine healing: Secondary | ICD-10-CM | POA: Diagnosis not present

## 2021-01-14 DIAGNOSIS — I120 Hypertensive chronic kidney disease with stage 5 chronic kidney disease or end stage renal disease: Secondary | ICD-10-CM | POA: Diagnosis not present

## 2021-01-14 DIAGNOSIS — Z794 Long term (current) use of insulin: Secondary | ICD-10-CM | POA: Diagnosis not present

## 2021-01-14 DIAGNOSIS — Z9181 History of falling: Secondary | ICD-10-CM | POA: Diagnosis not present

## 2021-01-14 DIAGNOSIS — Z7901 Long term (current) use of anticoagulants: Secondary | ICD-10-CM | POA: Diagnosis not present

## 2021-01-14 DIAGNOSIS — E1122 Type 2 diabetes mellitus with diabetic chronic kidney disease: Secondary | ICD-10-CM | POA: Diagnosis not present

## 2021-01-14 DIAGNOSIS — Z993 Dependence on wheelchair: Secondary | ICD-10-CM | POA: Diagnosis not present

## 2021-01-14 DIAGNOSIS — M25572 Pain in left ankle and joints of left foot: Secondary | ICD-10-CM

## 2021-01-14 DIAGNOSIS — M19072 Primary osteoarthritis, left ankle and foot: Secondary | ICD-10-CM | POA: Diagnosis not present

## 2021-01-14 DIAGNOSIS — M6281 Muscle weakness (generalized): Secondary | ICD-10-CM | POA: Diagnosis not present

## 2021-01-14 DIAGNOSIS — Z7951 Long term (current) use of inhaled steroids: Secondary | ICD-10-CM | POA: Diagnosis not present

## 2021-01-14 DIAGNOSIS — Z86718 Personal history of other venous thrombosis and embolism: Secondary | ICD-10-CM | POA: Diagnosis not present

## 2021-01-14 DIAGNOSIS — E785 Hyperlipidemia, unspecified: Secondary | ICD-10-CM | POA: Diagnosis not present

## 2021-01-14 NOTE — Progress Notes (Signed)
Office Visit Note   Patient: Jean Mcclain           Date of Birth: [birth year]           MRN: [id number] Visit Date: 01/14/2021              Requested by: Leanna Battles, [place],  [place] [id number] PCP: Leanna Battles, MD  Chief Complaint  Patient presents with  . Left Ankle - Follow-up    06/24/20 left tibiocalcaneal fusion       HPI: Patient presents in follow-up today she is 7 months status post left tibial calcaneal fusion.  We have not seen her in follow-up since October.  At that time she was placed in a PRAFO boot on the left.  She comes in today with a postop shoe on the right PRAFO shoe on the left.  She states that she does not have any shoes to wear which is why she is wearing the postop shoe.  Her family got rid of her shoes because they did not fit and she has not gotten new ones at the assisted living.  She is complaining mostly of plantar heel pain on the left.  She has been unable to stand because of this she gets occasional physical therapy  Assessment & Plan: Visit Diagnoses:  1. Pain in left ankle and joints of left foot     Plan: I wrote her prescriptions for both [employer] for diabetic shoes and inserts.  This is medically necessary for her as she has neuropathy.  Also onychomycotic nails were trimmed x10 the screw in her heel does go quite close to the surface could be a consideration for aggravating some of her pain although she has a significant bone spur as well also given her a formal referral to physical therapy  Follow-Up Instructions: No follow-ups on file.   Ortho Exam  Patient is alert, oriented, no adenopathy, well-dressed, normal affect, normal respiratory effort. Examination of her left ankle foot no erythema mild soft tissue swelling.  She is focally tender over the plantar insertion of the plantar fascia.  No posterior heel pain or bogginess.  No ascending cellulitis.  No open ulcers.  Her skin is quite dry and  peeling.  No signs of acute infection  Imaging: No results found. No images are attached to the encounter.  Labs: Lab Results  Component Value Date   HGBA1C 6.6 (H) 09/29/2020   HGBA1C 6.3 (H) 01/22/2020   HGBA1C 7.1 (H) 03/11/2017   CRP 5.4 (H) 01/23/2020   LABURIC 1.5 (L) 01/24/2020   LABURIC 5.0 02/09/2017   REPTSTATUS 04/11/2017 FINAL 04/06/2017   REPTSTATUS 04/08/2017 FINAL 04/06/2017   GRAMSTAIN NO WBC SEEN NO ORGANISMS SEEN  04/06/2017   CULT NO GROWTH 5 DAYS 04/06/2017   LABORGA ESCHERICHIA COLI 03/30/2017     Lab Results  Component Value Date   ALBUMIN 2.4 (L) 07/28/2020   ALBUMIN 2.3 (L) 06/27/2020   ALBUMIN 2.5 (L) 06/25/2020    Lab Results  Component Value Date   MG 2.2 01/25/2020   MG 1.4 (L) 01/24/2020   MG 2.2 04/02/2017   No results found for: VD25OH  No results found for: PREALBUMIN CBC EXTENDED Latest Ref Rng & Units 09/29/2020 07/28/2020 06/27/2020  WBC 4.0 - 10.5 K/uL 9.2 9.0 10.7(H)  RBC 3.87 - 5.11 MIL/uL 3.87 3.34(L) 2.86(L)  HGB 12.0 - 15.0 g/dL 11.6(L) 10.1(L) 8.6(L)  HCT 36.0 - 46.0 % 38.4 36.1 29.3(L)  PLT 150 - 400 K/uL 196 229 258  NEUTROABS 1.7 - 7.7 K/uL - - -  LYMPHSABS 0.7 - 4.0 K/uL - - -     There is no height or weight on file to calculate BMI.  Orders:  Orders Placed This Encounter  Procedures  . XR Ankle Complete Left   No orders of the defined types were placed in this encounter.    Procedures: No procedures performed  Clinical Data: No additional findings.  ROS:  All other systems negative, except as noted in the HPI. Review of Systems  Objective: Vital Signs: There were no vitals taken for this visit.  Specialty Comments:  No specialty comments available.  PMFS History: Patient Active Problem List   Diagnosis Date Noted  . Postmenopausal vaginal bleeding   . Acute blood loss anemia 06/25/2020  . Acute on chronic respiratory failure with hypoxia and hypercapnia ([place]) 06/25/2020  . Advanced care  planning/counseling discussion   . Goals of care, counseling/discussion   . Obstructive sleep apnea   . Primary osteoarthritis, left ankle and foot   . Ankle fracture 06/14/2020  . HLD (hyperlipidemia) 06/14/2020  . Dyspnea, unspecified 02/05/2020  . Intractable pain 01/24/2020  . Bilateral shoulder pain 01/23/2020  . Right knee pain 01/23/2020  . Uncontrolled type 2 diabetes mellitus with hyperglycemia, with long-term current use of insulin ([place]) 01/23/2020  . Pressure injury of skin 01/22/2020  . Mild protein-calorie malnutrition ([place]) 12/16/2019  . Personal history of anaphylaxis 12/05/2019  . Pruritus, unspecified 12/05/2019  . ESRD on hemodialysis ([place]) 11/20/2019  . Myoclonus, segmental 11/20/2019  . Allergy, unspecified, initial encounter 07/29/2019  . Anemia in chronic kidney disease 07/15/2019  . Coagulation defect, unspecified ([place]) 07/13/2019  . Iron deficiency anemia, unspecified 07/11/2019  . Encounter for immunization 07/06/2019  . End stage renal disease ([place]) 07/02/2019  . Gastro-esophageal reflux disease without esophagitis 07/02/2019  . Low back pain 07/02/2019  . BMI 34.0-34.9,adult 07/02/2019  . Other specified disorders of bone density and structure, unspecified site 07/02/2019  . Secondary hyperparathyroidism of renal origin ([place]) 07/02/2019  . Unspecified osteoarthritis, unspecified site 07/02/2019  . OSA (obstructive sleep apnea) 03/13/2019  . Chronic acquired lymphedema 11/05/2018  . Acute deep vein thrombosis (DVT) of popliteal vein of left lower extremity ([place]) 03/11/2017  . Insulin-requiring or dependent type II diabetes mellitus ([place]) 03/11/2017  . Essential hypertension 03/11/2017  . Impingement syndrome of left ankle 01/31/2017  . Posterior tibial tendinitis, left leg 01/31/2017  . Incarcerated ventral hernia 01/02/2013   Past Medical History:  Diagnosis Date  . Acute and chronic respiratory failure (acute-on-chronic) ([place])   . Anxiety   .  Arthritis    osteo  . Chronic kidney disease   . Diabetes mellitus, type 2 (HCC)    Type II  . DVT (deep venous thrombosis) ([place]) 03/2017   left popliteal  . Facet joint disease   . Facet joint disease    foot  . Fibula fracture 02/2017   left  . GERD (gastroesophageal reflux disease)   . H/O blood clots   . Hyperlipidemia   . Hypertension   . Macrocytic anemia   . Morbid obesity ([place])   . OSA (obstructive sleep apnea)    does not use CPAP   . Osteopenia   . Renal disorder    MWF dialysis  . Shortness of breath    with exertion  . Supplemental oxygen dependent   . Venous stasis ulcers ([place])   . Vitamin D  deficiency     Family History  Problem Relation Age of Onset  . Leukemia Mother   . Hypertension Brother   . Emphysema Father     Past Surgical History:  Procedure Laterality Date  . ANKLE FUSION Left 06/24/2020   Procedure: LEFT TIBIOCALCANEAL FUSION;  Surgeon: [name], MD;  Location: [facility];  Service: Orthopedics;  Laterality: Left;  . AV FISTULA PLACEMENT Right 08/20/2018   Procedure: ARTERIOVENOUS (AV) FISTULA CREATION BRACHIOCEPHALIC;  Surgeon: Angelia Mould, MD;  Location: [facility];  Service: Vascular;  Laterality: Right;  . BREAST SURGERY Right 03/1997   biospy  . CARPAL TUNNEL RELEASE Left 2016  . HYSTEROSCOPY WITH D & C N/A 09/29/2020   Procedure: DILATATION AND CURETTAGE /HYSTEROSCOPY;  Surgeon: Mora Bellman, MD;  Location: [facility];  Service: Gynecology;  Laterality: N/A;  . INSERTION OF MESH N/A 01/08/2013   Procedure: INSERTION OF MESH;  Surgeon: Earnstine Regal, MD;  Location: [facility];  Service: General;  Laterality: N/A;  . IR THORACENTESIS ASP PLEURAL SPACE W/IMG GUIDE  04/06/2017  . JOINT REPLACEMENT Right 1999  . TOTAL KNEE ARTHROPLASTY Left 1999   Left  . TRIGGER FINGER RELEASE Left 11/2014  . TRIGGER FINGER RELEASE Right   . VENTRAL HERNIA REPAIR N/A 01/08/2013   Procedure: LAPAROSCOPIC VENTRAL HERNIA;  Surgeon: Earnstine Regal, MD;   Location: [facility];  Service: General;  Laterality: N/A;   Social History   Occupational History  . Not on file  Tobacco Use  . Smoking status: Former Smoker    Years: 27.00    Types: Cigarettes    Quit date: 10/10/1981    Years since quitting: 39.2  . Smokeless tobacco: Never Used  Vaping Use  . Vaping Use: Never used  Substance and Sexual Activity  . Alcohol use: No  . Drug use: No  . Sexual activity: Not on file

## 2021-01-18 DIAGNOSIS — N2581 Secondary hyperparathyroidism of renal origin: Secondary | ICD-10-CM | POA: Diagnosis not present

## 2021-01-18 DIAGNOSIS — Z23 Encounter for immunization: Secondary | ICD-10-CM | POA: Diagnosis not present

## 2021-01-18 DIAGNOSIS — S82422D Displaced transverse fracture of shaft of left fibula, subsequent encounter for closed fracture with routine healing: Secondary | ICD-10-CM | POA: Diagnosis not present

## 2021-01-18 DIAGNOSIS — M19072 Primary osteoarthritis, left ankle and foot: Secondary | ICD-10-CM | POA: Diagnosis not present

## 2021-01-18 DIAGNOSIS — Z992 Dependence on renal dialysis: Secondary | ICD-10-CM | POA: Diagnosis not present

## 2021-01-18 DIAGNOSIS — M6281 Muscle weakness (generalized): Secondary | ICD-10-CM | POA: Diagnosis not present

## 2021-01-18 DIAGNOSIS — Z9181 History of falling: Secondary | ICD-10-CM | POA: Diagnosis not present

## 2021-01-18 DIAGNOSIS — N186 End stage renal disease: Secondary | ICD-10-CM | POA: Diagnosis not present

## 2021-01-18 DIAGNOSIS — Z993 Dependence on wheelchair: Secondary | ICD-10-CM | POA: Diagnosis not present

## 2021-01-19 DIAGNOSIS — I120 Hypertensive chronic kidney disease with stage 5 chronic kidney disease or end stage renal disease: Secondary | ICD-10-CM | POA: Diagnosis not present

## 2021-01-19 DIAGNOSIS — Z794 Long term (current) use of insulin: Secondary | ICD-10-CM | POA: Diagnosis not present

## 2021-01-19 DIAGNOSIS — Z9181 History of falling: Secondary | ICD-10-CM | POA: Diagnosis not present

## 2021-01-19 DIAGNOSIS — U071 COVID-19: Secondary | ICD-10-CM | POA: Diagnosis not present

## 2021-01-19 DIAGNOSIS — Z7901 Long term (current) use of anticoagulants: Secondary | ICD-10-CM | POA: Diagnosis not present

## 2021-01-19 DIAGNOSIS — N186 End stage renal disease: Secondary | ICD-10-CM | POA: Diagnosis not present

## 2021-01-19 DIAGNOSIS — S82422D Displaced transverse fracture of shaft of left fibula, subsequent encounter for closed fracture with routine healing: Secondary | ICD-10-CM | POA: Diagnosis not present

## 2021-01-19 DIAGNOSIS — Z992 Dependence on renal dialysis: Secondary | ICD-10-CM | POA: Diagnosis not present

## 2021-01-19 DIAGNOSIS — E785 Hyperlipidemia, unspecified: Secondary | ICD-10-CM | POA: Diagnosis not present

## 2021-01-19 DIAGNOSIS — E1122 Type 2 diabetes mellitus with diabetic chronic kidney disease: Secondary | ICD-10-CM | POA: Diagnosis not present

## 2021-01-19 DIAGNOSIS — M19072 Primary osteoarthritis, left ankle and foot: Secondary | ICD-10-CM | POA: Diagnosis not present

## 2021-01-19 DIAGNOSIS — Z993 Dependence on wheelchair: Secondary | ICD-10-CM | POA: Diagnosis not present

## 2021-01-19 DIAGNOSIS — M6281 Muscle weakness (generalized): Secondary | ICD-10-CM | POA: Diagnosis not present

## 2021-01-19 DIAGNOSIS — Z7951 Long term (current) use of inhaled steroids: Secondary | ICD-10-CM | POA: Diagnosis not present

## 2021-01-19 DIAGNOSIS — Z9981 Dependence on supplemental oxygen: Secondary | ICD-10-CM | POA: Diagnosis not present

## 2021-01-19 DIAGNOSIS — Z6835 Body mass index (BMI) 35.0-35.9, adult: Secondary | ICD-10-CM | POA: Diagnosis not present

## 2021-01-19 DIAGNOSIS — Z86718 Personal history of other venous thrombosis and embolism: Secondary | ICD-10-CM | POA: Diagnosis not present

## 2021-01-25 DIAGNOSIS — M6281 Muscle weakness (generalized): Secondary | ICD-10-CM | POA: Diagnosis not present

## 2021-01-25 DIAGNOSIS — M19072 Primary osteoarthritis, left ankle and foot: Secondary | ICD-10-CM | POA: Diagnosis not present

## 2021-01-25 DIAGNOSIS — Z9181 History of falling: Secondary | ICD-10-CM | POA: Diagnosis not present

## 2021-01-25 DIAGNOSIS — E876 Hypokalemia: Secondary | ICD-10-CM | POA: Diagnosis not present

## 2021-01-25 DIAGNOSIS — Z992 Dependence on renal dialysis: Secondary | ICD-10-CM | POA: Diagnosis not present

## 2021-01-25 DIAGNOSIS — N2581 Secondary hyperparathyroidism of renal origin: Secondary | ICD-10-CM | POA: Diagnosis not present

## 2021-01-25 DIAGNOSIS — N186 End stage renal disease: Secondary | ICD-10-CM | POA: Diagnosis not present

## 2021-01-25 DIAGNOSIS — S82422D Displaced transverse fracture of shaft of left fibula, subsequent encounter for closed fracture with routine healing: Secondary | ICD-10-CM | POA: Diagnosis not present

## 2021-01-25 DIAGNOSIS — Z993 Dependence on wheelchair: Secondary | ICD-10-CM | POA: Diagnosis not present

## 2021-01-26 ENCOUNTER — Telehealth: Payer: Self-pay | Admitting: Pulmonary Disease

## 2021-01-26 DIAGNOSIS — E1152 Type 2 diabetes mellitus with diabetic peripheral angiopathy with gangrene: Secondary | ICD-10-CM | POA: Diagnosis not present

## 2021-01-26 DIAGNOSIS — G4733 Obstructive sleep apnea (adult) (pediatric): Secondary | ICD-10-CM

## 2021-01-26 DIAGNOSIS — L304 Erythema intertrigo: Secondary | ICD-10-CM | POA: Diagnosis not present

## 2021-01-26 DIAGNOSIS — Z79899 Other long term (current) drug therapy: Secondary | ICD-10-CM | POA: Diagnosis not present

## 2021-01-26 DIAGNOSIS — J9612 Chronic respiratory failure with hypercapnia: Secondary | ICD-10-CM

## 2021-01-26 DIAGNOSIS — Z794 Long term (current) use of insulin: Secondary | ICD-10-CM | POA: Diagnosis not present

## 2021-01-26 DIAGNOSIS — E662 Morbid (severe) obesity with alveolar hypoventilation: Secondary | ICD-10-CM

## 2021-01-26 NOTE — Telephone Encounter (Signed)
[name] with [employer] called back, states that [employer] was working on getting pt a Trilogy vent in October and not a BiPAP. Lenna Sciara is going to speak to [facility] and see if the patient will even be able to use a BiPAP at their facility, as many facilities will not allow/manage BiPAPs for certain levels of care.  Once Lenna Sciara finds out what the patient is allowed to use, she will reach back out to keep [place] updated. Will keep message open.

## 2021-01-26 NOTE — Telephone Encounter (Signed)
Spoke with Wells Guiles at [facility], states that pt has been transferred to them for care and does not have a BiPAP. Pt was hospitalized, then transferred to SNF, then has arrived to them. Pt states she was wearing BiPAP qhs throughout this time but it was provided by the facility and was not her home machine. Pt does not know where her machine is. Per Wells Guiles, the pt's brother is local and does not know where the machine is either.   Per chart, the pt was previously on a Trilogy Vent, but her previous facility did not allow these machines. Pt asked to go back on BiPAP despite previously failing on BiPAP. This was ordered for pt 07/2020 through [employer]. It is unclear if pt ever received the home machine before being hospitalized.   Called [name] at [employer] to see if this pt ever received a BiPAP, and if not what we can do to get her one. [name] will look into this and call us back. Will await call back.

## 2021-01-27 NOTE — Telephone Encounter (Signed)
Spoke with [name] with [employer] and was advised pt needs to start the process over. The new facility she is at does not have any machine for her to use nor do they allow NIV. Because pt did not qualify for bipap before, she will need a new sleep study with titration study to see if we can qualify her this time as an NIV is not an option. Per Lenna Sciara, [facility] is able to still use the OV from 07/2020 but the sleep studies are needed; split night first to see if pt can fail CPAP and be placed on a BiPAP then a titration study to get the pressure for a BiPAP. Pt is on nocturnal O2. If a new order is placed it just needs to state "BiPAP" not "bipap with back up rate" as pt did not qualify for this. Will need to expedite this if possible.    Dr. Ander Slade, please advise if ok to order split night to re-qualify for bipap.

## 2021-01-27 NOTE — Telephone Encounter (Signed)
Called [facility] and spoke with nurse Lannette Donath letting her know that we were going to order a split night sleep study for pt to have done. Lannette Donath verbalized understanding and asked if pt would have to go out of the facility to do the split night study and stated to her that the split night studies are done at the sleep disorder center.  Lannette Donath stated that we would need to contact pt's family/responsible party to get the sleep study scheduled. Order has been placed and this has been specified. Nothing further needed.

## 2021-01-27 NOTE — Telephone Encounter (Signed)
Lmtcb for Jean Mcclain with [employer].  ?

## 2021-01-27 NOTE — Telephone Encounter (Signed)
I am okay with ordering a split-night study

## 2021-02-03 DIAGNOSIS — N2581 Secondary hyperparathyroidism of renal origin: Secondary | ICD-10-CM | POA: Diagnosis not present

## 2021-02-03 DIAGNOSIS — N186 End stage renal disease: Secondary | ICD-10-CM | POA: Diagnosis not present

## 2021-02-03 DIAGNOSIS — E876 Hypokalemia: Secondary | ICD-10-CM | POA: Diagnosis not present

## 2021-02-03 DIAGNOSIS — Z992 Dependence on renal dialysis: Secondary | ICD-10-CM | POA: Diagnosis not present

## 2021-02-04 DIAGNOSIS — E785 Hyperlipidemia, unspecified: Secondary | ICD-10-CM | POA: Diagnosis not present

## 2021-02-04 DIAGNOSIS — Z7901 Long term (current) use of anticoagulants: Secondary | ICD-10-CM | POA: Diagnosis not present

## 2021-02-04 DIAGNOSIS — U071 COVID-19: Secondary | ICD-10-CM | POA: Diagnosis not present

## 2021-02-04 DIAGNOSIS — Z794 Long term (current) use of insulin: Secondary | ICD-10-CM | POA: Diagnosis not present

## 2021-02-04 DIAGNOSIS — E1122 Type 2 diabetes mellitus with diabetic chronic kidney disease: Secondary | ICD-10-CM | POA: Diagnosis not present

## 2021-02-04 DIAGNOSIS — M19072 Primary osteoarthritis, left ankle and foot: Secondary | ICD-10-CM | POA: Diagnosis not present

## 2021-02-04 DIAGNOSIS — Z993 Dependence on wheelchair: Secondary | ICD-10-CM | POA: Diagnosis not present

## 2021-02-04 DIAGNOSIS — S82422D Displaced transverse fracture of shaft of left fibula, subsequent encounter for closed fracture with routine healing: Secondary | ICD-10-CM | POA: Diagnosis not present

## 2021-02-04 DIAGNOSIS — Z992 Dependence on renal dialysis: Secondary | ICD-10-CM | POA: Diagnosis not present

## 2021-02-04 DIAGNOSIS — Z9981 Dependence on supplemental oxygen: Secondary | ICD-10-CM | POA: Diagnosis not present

## 2021-02-04 DIAGNOSIS — Z6835 Body mass index (BMI) 35.0-35.9, adult: Secondary | ICD-10-CM | POA: Diagnosis not present

## 2021-02-04 DIAGNOSIS — N186 End stage renal disease: Secondary | ICD-10-CM | POA: Diagnosis not present

## 2021-02-04 DIAGNOSIS — M6281 Muscle weakness (generalized): Secondary | ICD-10-CM | POA: Diagnosis not present

## 2021-02-04 DIAGNOSIS — Z7951 Long term (current) use of inhaled steroids: Secondary | ICD-10-CM | POA: Diagnosis not present

## 2021-02-04 DIAGNOSIS — I120 Hypertensive chronic kidney disease with stage 5 chronic kidney disease or end stage renal disease: Secondary | ICD-10-CM | POA: Diagnosis not present

## 2021-02-04 DIAGNOSIS — Z9181 History of falling: Secondary | ICD-10-CM | POA: Diagnosis not present

## 2021-02-04 DIAGNOSIS — Z86718 Personal history of other venous thrombosis and embolism: Secondary | ICD-10-CM | POA: Diagnosis not present

## 2021-02-06 DIAGNOSIS — Z992 Dependence on renal dialysis: Secondary | ICD-10-CM | POA: Diagnosis not present

## 2021-02-06 DIAGNOSIS — E1022 Type 1 diabetes mellitus with diabetic chronic kidney disease: Secondary | ICD-10-CM | POA: Diagnosis not present

## 2021-02-06 DIAGNOSIS — N186 End stage renal disease: Secondary | ICD-10-CM | POA: Diagnosis not present

## 2021-02-08 DIAGNOSIS — Z992 Dependence on renal dialysis: Secondary | ICD-10-CM | POA: Diagnosis not present

## 2021-02-08 DIAGNOSIS — E119 Type 2 diabetes mellitus without complications: Secondary | ICD-10-CM | POA: Diagnosis not present

## 2021-02-08 DIAGNOSIS — N2581 Secondary hyperparathyroidism of renal origin: Secondary | ICD-10-CM | POA: Diagnosis not present

## 2021-02-08 DIAGNOSIS — E876 Hypokalemia: Secondary | ICD-10-CM | POA: Diagnosis not present

## 2021-02-08 DIAGNOSIS — N186 End stage renal disease: Secondary | ICD-10-CM | POA: Diagnosis not present

## 2021-02-08 DIAGNOSIS — Z794 Long term (current) use of insulin: Secondary | ICD-10-CM | POA: Diagnosis not present

## 2021-02-09 DIAGNOSIS — N189 Chronic kidney disease, unspecified: Secondary | ICD-10-CM | POA: Diagnosis not present

## 2021-02-09 DIAGNOSIS — E7800 Pure hypercholesterolemia, unspecified: Secondary | ICD-10-CM | POA: Diagnosis not present

## 2021-02-09 DIAGNOSIS — E1165 Type 2 diabetes mellitus with hyperglycemia: Secondary | ICD-10-CM | POA: Diagnosis not present

## 2021-02-11 ENCOUNTER — Encounter: Payer: Self-pay | Admitting: Orthopedic Surgery

## 2021-02-11 ENCOUNTER — Ambulatory Visit (INDEPENDENT_AMBULATORY_CARE_PROVIDER_SITE_OTHER): Payer: HMO | Admitting: Physician Assistant

## 2021-02-11 VITALS — Ht 60.0 in | Wt 198.0 lb

## 2021-02-11 DIAGNOSIS — F0391 Unspecified dementia with behavioral disturbance: Secondary | ICD-10-CM | POA: Diagnosis not present

## 2021-02-11 DIAGNOSIS — M25512 Pain in left shoulder: Secondary | ICD-10-CM | POA: Diagnosis not present

## 2021-02-11 DIAGNOSIS — F5102 Adjustment insomnia: Secondary | ICD-10-CM | POA: Diagnosis not present

## 2021-02-11 DIAGNOSIS — M25511 Pain in right shoulder: Secondary | ICD-10-CM

## 2021-02-11 MED ORDER — METHYLPREDNISOLONE ACETATE 40 MG/ML IJ SUSP
40.0000 mg | INTRAMUSCULAR | Status: AC | PRN
  Administered 2021-02-11: 40 mg via INTRA_ARTICULAR

## 2021-02-11 MED ORDER — LIDOCAINE HCL 1 % IJ SOLN
5.0000 mL | INTRAMUSCULAR | Status: AC | PRN
  Administered 2021-02-11: 5 mL

## 2021-02-11 NOTE — Progress Notes (Signed)
Office Visit Note   Patient: Jean Mcclain           Date of Birth: [birth year]           MRN: [id number] Visit Date: 02/11/2021              Requested by: Leanna Battles, [place],  [place] [address] PCP: Leanna Battles, MD  Chief Complaint  Patient presents with  . Left Foot - Follow-up    35mon s/p tibial calcaneal fusion      HPI: Patient is a pleasant 75 year old woman who resides at an assisted living.  She is 8 months status post tibial calcaneal fusion.  Unfortunately she has had to have trouble obtaining shoes.  She said that all of her shoes were thrown out by her family because they would not fit her anymore she is working with her primary care doctor to get a prescription for diabetic orthopedic shoes.  For now she is wearing a PRAFO in a postop shoe.  She is also complaining of bilateral shoulder pain.  She has had problems with her shoulders in the past and is wondering if she could get an injection.  She is trying to do physical therapy but has trouble getting up because her shoulders are pain  Assessment & Plan: Visit Diagnoses: No diagnosis found.  Plan: Shoulders were injected today.  Tolerated it well.  I have told her to try and at least get a pair of roomy inexpensive shoes to use while she is waiting on her new shoes.  Follow-up in 4 weeks  Follow-Up Instructions: No follow-ups on file.   Ortho Exam  Patient is alert, oriented, no adenopathy, well-dressed, normal affect, normal respiratory effort. Examination of her bilateral shoulders.  She has no obvious deformity.  She has very little motion secondary to pain which she has difficulty abducting her arm she has cannot go behind her back.  Tenderness with palpation and impingement findings.  No findings of erythema or swelling or infection Left ankle incision is completely healed no swelling no erythema overall foot is in good position  Imaging: No results found. No images are attached  to the encounter.  Labs: Lab Results  Component Value Date   HGBA1C 6.6 (H) 09/29/2020   HGBA1C 6.3 (H) 01/22/2020   HGBA1C 7.1 (H) 03/11/2017   CRP 5.4 (H) 01/23/2020   LABURIC 1.5 (L) 01/24/2020   LABURIC 5.0 02/09/2017   REPTSTATUS 04/11/2017 FINAL 04/06/2017   REPTSTATUS 04/08/2017 FINAL 04/06/2017   GRAMSTAIN NO WBC SEEN NO ORGANISMS SEEN  04/06/2017   CULT NO GROWTH 5 DAYS 04/06/2017   LABORGA ESCHERICHIA COLI 03/30/2017     Lab Results  Component Value Date   ALBUMIN 2.4 (L) 07/28/2020   ALBUMIN 2.3 (L) 06/27/2020   ALBUMIN 2.5 (L) 06/25/2020    Lab Results  Component Value Date   MG 2.2 01/25/2020   MG 1.4 (L) 01/24/2020   MG 2.2 04/02/2017   No results found for: VD25OH  No results found for: PREALBUMIN CBC EXTENDED Latest Ref Rng & Units 09/29/2020 07/28/2020 06/27/2020  WBC 4.0 - 10.5 K/uL 9.2 9.0 10.7(H)  RBC 3.87 - 5.11 MIL/uL 3.87 3.34(L) 2.86(L)  HGB 12.0 - 15.0 g/dL 11.6(L) 10.1(L) 8.6(L)  HCT 36.0 - 46.0 % 38.4 36.1 29.3(L)  PLT 150 - 400 K/uL 196 229 258  NEUTROABS 1.7 - 7.7 K/uL - - -  LYMPHSABS 0.7 - 4.0 K/uL - - -  Body mass index is 38.67 kg/m.  Orders:  No orders of the defined types were placed in this encounter.  No orders of the defined types were placed in this encounter.    Procedures: Large Joint Inj: bilateral subacromial bursa on 02/11/2021 11:03 AM Indications: diagnostic evaluation and pain Details: 22 G 1.5 in needle, posterior approach  Arthrogram: No  Medications (Right): 5 mL lidocaine 1 %; 40 mg methylPREDNISolone acetate 40 MG/ML Medications (Left): 5 mL lidocaine 1 %; 40 mg methylPREDNISolone acetate 40 MG/ML Outcome: tolerated well, no immediate complications Procedure, treatment alternatives, risks and benefits explained, specific risks discussed. Consent was given by the patient.      Clinical Data: No additional findings.  ROS:  All other systems negative, except as noted in the HPI. Review of  Systems  Objective: Vital Signs: Ht 5' (1.524 m)   Wt 198 lb (89.8 kg)   BMI 38.67 kg/m   Specialty Comments:  No specialty comments available.  PMFS History: Patient Active Problem List   Diagnosis Date Noted  . Postmenopausal vaginal bleeding   . Acute blood loss anemia 06/25/2020  . Acute on chronic respiratory failure with hypoxia and hypercapnia ([place]) 06/25/2020  . Advanced care planning/counseling discussion   . Goals of care, counseling/discussion   . Obstructive sleep apnea   . Primary osteoarthritis, left ankle and foot   . Ankle fracture 06/14/2020  . HLD (hyperlipidemia) 06/14/2020  . Dyspnea, unspecified 02/05/2020  . Intractable pain 01/24/2020  . Bilateral shoulder pain 01/23/2020  . Right knee pain 01/23/2020  . Uncontrolled type 2 diabetes mellitus with hyperglycemia, with long-term current use of insulin ([place]) 01/23/2020  . Pressure injury of skin 01/22/2020  . Mild protein-calorie malnutrition ([place]) 12/16/2019  . Personal history of anaphylaxis 12/05/2019  . Pruritus, unspecified 12/05/2019  . ESRD on hemodialysis ([place]) 11/20/2019  . Myoclonus, segmental 11/20/2019  . Allergy, unspecified, initial encounter 07/29/2019  . Anemia in chronic kidney disease 07/15/2019  . Coagulation defect, unspecified ([place]) 07/13/2019  . Iron deficiency anemia, unspecified 07/11/2019  . Encounter for immunization 07/06/2019  . End stage renal disease ([place]) 07/02/2019  . Gastro-esophageal reflux disease without esophagitis 07/02/2019  . Low back pain 07/02/2019  . BMI 34.0-34.9,adult 07/02/2019  . Other specified disorders of bone density and structure, unspecified site 07/02/2019  . Secondary hyperparathyroidism of renal origin ([place]) 07/02/2019  . Unspecified osteoarthritis, unspecified site 07/02/2019  . OSA (obstructive sleep apnea) 03/13/2019  . Chronic acquired lymphedema 11/05/2018  . Acute deep vein thrombosis (DVT) of popliteal vein of left lower extremity ([place])  03/11/2017  . Insulin-requiring or dependent type II diabetes mellitus ([place]) 03/11/2017  . Essential hypertension 03/11/2017  . Impingement syndrome of left ankle 01/31/2017  . Posterior tibial tendinitis, left leg 01/31/2017  . Incarcerated ventral hernia 01/02/2013   Past Medical History:  Diagnosis Date  . Acute and chronic respiratory failure (acute-on-chronic) ([place])   . Anxiety   . Arthritis    osteo  . Chronic kidney disease   . Diabetes mellitus, type 2 (HCC)    Type II  . DVT (deep venous thrombosis) ([place]) 03/2017   left popliteal  . Facet joint disease   . Facet joint disease    foot  . Fibula fracture 02/2017   left  . GERD (gastroesophageal reflux disease)   . H/O blood clots   . Hyperlipidemia   . Hypertension   . Macrocytic anemia   . Morbid obesity ([place])   . OSA (obstructive sleep apnea)  does not use CPAP   . Osteopenia   . Renal disorder    MWF dialysis  . Shortness of breath    with exertion  . Supplemental oxygen dependent   . Venous stasis ulcers ([place])   . Vitamin D deficiency     Family History  Problem Relation Age of Onset  . Leukemia Mother   . Hypertension Brother   . Emphysema Father     Past Surgical History:  Procedure Laterality Date  . ANKLE FUSION Left 06/24/2020   Procedure: LEFT TIBIOCALCANEAL FUSION;  Surgeon: [name], MD;  Location: [facility];  Service: Orthopedics;  Laterality: Left;  . AV FISTULA PLACEMENT Right 08/20/2018   Procedure: ARTERIOVENOUS (AV) FISTULA CREATION BRACHIOCEPHALIC;  Surgeon: Angelia Mould, MD;  Location: [facility];  Service: Vascular;  Laterality: Right;  . BREAST SURGERY Right 03/1997   biospy  . CARPAL TUNNEL RELEASE Left 2016  . HYSTEROSCOPY WITH D & C N/A 09/29/2020   Procedure: DILATATION AND CURETTAGE /HYSTEROSCOPY;  Surgeon: Mora Bellman, MD;  Location: [facility];  Service: Gynecology;  Laterality: N/A;  . INSERTION OF MESH N/A 01/08/2013   Procedure: INSERTION OF MESH;  Surgeon: Earnstine Regal, MD;  Location: [facility];  Service: General;  Laterality: N/A;  . IR THORACENTESIS ASP PLEURAL SPACE W/IMG GUIDE  04/06/2017  . JOINT REPLACEMENT Right 1999  . TOTAL KNEE ARTHROPLASTY Left 1999   Left  . TRIGGER FINGER RELEASE Left 11/2014  . TRIGGER FINGER RELEASE Right   . VENTRAL HERNIA REPAIR N/A 01/08/2013   Procedure: LAPAROSCOPIC VENTRAL HERNIA;  Surgeon: Earnstine Regal, MD;  [facility];  Service: General;  Laterality: N/A;   Social History   Occupational History  . Not on file  Tobacco Use  . Smoking status: Former Smoker    Years: 27.00    Types: Cigarettes    Quit date: 10/10/1981    Years since quitting: 39.3  . Smokeless tobacco: Never Used  Vaping Use  . Vaping Use: Never used  Substance and Sexual Activity  . Alcohol use: No  . Drug use: No  . Sexual activity: Not on file

## 2021-02-12 ENCOUNTER — Other Ambulatory Visit: Payer: Self-pay | Admitting: *Deleted

## 2021-02-12 DIAGNOSIS — M79603 Pain in arm, unspecified: Secondary | ICD-10-CM

## 2021-02-12 DIAGNOSIS — N186 End stage renal disease: Secondary | ICD-10-CM

## 2021-02-15 DIAGNOSIS — N2581 Secondary hyperparathyroidism of renal origin: Secondary | ICD-10-CM | POA: Diagnosis not present

## 2021-02-15 DIAGNOSIS — N186 End stage renal disease: Secondary | ICD-10-CM | POA: Diagnosis not present

## 2021-02-15 DIAGNOSIS — Z992 Dependence on renal dialysis: Secondary | ICD-10-CM | POA: Diagnosis not present

## 2021-02-16 DIAGNOSIS — E1165 Type 2 diabetes mellitus with hyperglycemia: Secondary | ICD-10-CM | POA: Diagnosis not present

## 2021-02-16 DIAGNOSIS — N189 Chronic kidney disease, unspecified: Secondary | ICD-10-CM | POA: Diagnosis not present

## 2021-02-16 DIAGNOSIS — E0840 Diabetes mellitus due to underlying condition with diabetic neuropathy, unspecified: Secondary | ICD-10-CM | POA: Diagnosis not present

## 2021-02-16 DIAGNOSIS — E7800 Pure hypercholesterolemia, unspecified: Secondary | ICD-10-CM | POA: Diagnosis not present

## 2021-02-16 DIAGNOSIS — F419 Anxiety disorder, unspecified: Secondary | ICD-10-CM | POA: Diagnosis not present

## 2021-02-16 DIAGNOSIS — I10 Essential (primary) hypertension: Secondary | ICD-10-CM | POA: Diagnosis not present

## 2021-02-16 DIAGNOSIS — F331 Major depressive disorder, recurrent, moderate: Secondary | ICD-10-CM | POA: Diagnosis not present

## 2021-02-16 DIAGNOSIS — N186 End stage renal disease: Secondary | ICD-10-CM | POA: Diagnosis not present

## 2021-02-17 DIAGNOSIS — Z9181 History of falling: Secondary | ICD-10-CM | POA: Diagnosis not present

## 2021-02-17 DIAGNOSIS — Z993 Dependence on wheelchair: Secondary | ICD-10-CM | POA: Diagnosis not present

## 2021-02-17 DIAGNOSIS — S82422D Displaced transverse fracture of shaft of left fibula, subsequent encounter for closed fracture with routine healing: Secondary | ICD-10-CM | POA: Diagnosis not present

## 2021-02-17 DIAGNOSIS — M19072 Primary osteoarthritis, left ankle and foot: Secondary | ICD-10-CM | POA: Diagnosis not present

## 2021-02-17 DIAGNOSIS — M6281 Muscle weakness (generalized): Secondary | ICD-10-CM | POA: Diagnosis not present

## 2021-02-19 DIAGNOSIS — M19072 Primary osteoarthritis, left ankle and foot: Secondary | ICD-10-CM | POA: Diagnosis not present

## 2021-02-19 DIAGNOSIS — E1122 Type 2 diabetes mellitus with diabetic chronic kidney disease: Secondary | ICD-10-CM | POA: Diagnosis not present

## 2021-02-19 DIAGNOSIS — S82422D Displaced transverse fracture of shaft of left fibula, subsequent encounter for closed fracture with routine healing: Secondary | ICD-10-CM | POA: Diagnosis not present

## 2021-02-22 DIAGNOSIS — N2581 Secondary hyperparathyroidism of renal origin: Secondary | ICD-10-CM | POA: Diagnosis not present

## 2021-02-22 DIAGNOSIS — Z992 Dependence on renal dialysis: Secondary | ICD-10-CM | POA: Diagnosis not present

## 2021-02-22 DIAGNOSIS — N186 End stage renal disease: Secondary | ICD-10-CM | POA: Diagnosis not present

## 2021-02-22 DIAGNOSIS — E876 Hypokalemia: Secondary | ICD-10-CM | POA: Diagnosis not present

## 2021-02-23 DIAGNOSIS — Z7401 Bed confinement status: Secondary | ICD-10-CM | POA: Diagnosis not present

## 2021-02-23 DIAGNOSIS — N186 End stage renal disease: Secondary | ICD-10-CM | POA: Diagnosis not present

## 2021-02-23 DIAGNOSIS — E785 Hyperlipidemia, unspecified: Secondary | ICD-10-CM | POA: Diagnosis not present

## 2021-02-23 DIAGNOSIS — Z8616 Personal history of COVID-19: Secondary | ICD-10-CM | POA: Diagnosis not present

## 2021-02-23 DIAGNOSIS — Z7951 Long term (current) use of inhaled steroids: Secondary | ICD-10-CM | POA: Diagnosis not present

## 2021-02-23 DIAGNOSIS — E232 Diabetes insipidus: Secondary | ICD-10-CM | POA: Diagnosis not present

## 2021-02-23 DIAGNOSIS — Z86718 Personal history of other venous thrombosis and embolism: Secondary | ICD-10-CM | POA: Diagnosis not present

## 2021-02-23 DIAGNOSIS — M6281 Muscle weakness (generalized): Secondary | ICD-10-CM | POA: Diagnosis not present

## 2021-02-23 DIAGNOSIS — Z794 Long term (current) use of insulin: Secondary | ICD-10-CM | POA: Diagnosis not present

## 2021-02-23 DIAGNOSIS — Z7901 Long term (current) use of anticoagulants: Secondary | ICD-10-CM | POA: Diagnosis not present

## 2021-02-23 DIAGNOSIS — S82422D Displaced transverse fracture of shaft of left fibula, subsequent encounter for closed fracture with routine healing: Secondary | ICD-10-CM | POA: Diagnosis not present

## 2021-02-23 DIAGNOSIS — M19072 Primary osteoarthritis, left ankle and foot: Secondary | ICD-10-CM | POA: Diagnosis not present

## 2021-02-23 DIAGNOSIS — Z6835 Body mass index (BMI) 35.0-35.9, adult: Secondary | ICD-10-CM | POA: Diagnosis not present

## 2021-02-23 DIAGNOSIS — Z992 Dependence on renal dialysis: Secondary | ICD-10-CM | POA: Diagnosis not present

## 2021-02-23 DIAGNOSIS — I120 Hypertensive chronic kidney disease with stage 5 chronic kidney disease or end stage renal disease: Secondary | ICD-10-CM | POA: Diagnosis not present

## 2021-02-23 DIAGNOSIS — Z9181 History of falling: Secondary | ICD-10-CM | POA: Diagnosis not present

## 2021-02-23 DIAGNOSIS — Z9981 Dependence on supplemental oxygen: Secondary | ICD-10-CM | POA: Diagnosis not present

## 2021-02-23 DIAGNOSIS — E1122 Type 2 diabetes mellitus with diabetic chronic kidney disease: Secondary | ICD-10-CM | POA: Diagnosis not present

## 2021-02-24 DIAGNOSIS — Z993 Dependence on wheelchair: Secondary | ICD-10-CM | POA: Diagnosis not present

## 2021-02-24 DIAGNOSIS — M6281 Muscle weakness (generalized): Secondary | ICD-10-CM | POA: Diagnosis not present

## 2021-02-24 DIAGNOSIS — Z9181 History of falling: Secondary | ICD-10-CM | POA: Diagnosis not present

## 2021-02-24 DIAGNOSIS — S82422D Displaced transverse fracture of shaft of left fibula, subsequent encounter for closed fracture with routine healing: Secondary | ICD-10-CM | POA: Diagnosis not present

## 2021-02-24 DIAGNOSIS — M19072 Primary osteoarthritis, left ankle and foot: Secondary | ICD-10-CM | POA: Diagnosis not present

## 2021-02-26 DIAGNOSIS — F419 Anxiety disorder, unspecified: Secondary | ICD-10-CM | POA: Diagnosis not present

## 2021-02-26 DIAGNOSIS — F331 Major depressive disorder, recurrent, moderate: Secondary | ICD-10-CM | POA: Diagnosis not present

## 2021-03-01 DIAGNOSIS — N2581 Secondary hyperparathyroidism of renal origin: Secondary | ICD-10-CM | POA: Diagnosis not present

## 2021-03-01 DIAGNOSIS — N186 End stage renal disease: Secondary | ICD-10-CM | POA: Diagnosis not present

## 2021-03-01 DIAGNOSIS — Z992 Dependence on renal dialysis: Secondary | ICD-10-CM | POA: Diagnosis not present

## 2021-03-01 DIAGNOSIS — E876 Hypokalemia: Secondary | ICD-10-CM | POA: Diagnosis not present

## 2021-03-02 ENCOUNTER — Other Ambulatory Visit: Payer: Self-pay

## 2021-03-02 ENCOUNTER — Ambulatory Visit (INDEPENDENT_AMBULATORY_CARE_PROVIDER_SITE_OTHER): Payer: HMO | Admitting: Vascular Surgery

## 2021-03-02 ENCOUNTER — Ambulatory Visit (HOSPITAL_COMMUNITY)
Admission: RE | Admit: 2021-03-02 | Discharge: 2021-03-02 | Disposition: A | Discharge status: Home / Self Care | Payer: HMO | Source: Ambulatory Visit | Attending: Vascular Surgery | Admitting: Vascular Surgery

## 2021-03-02 ENCOUNTER — Encounter: Payer: Self-pay | Admitting: Vascular Surgery

## 2021-03-02 VITALS — BP 117/72 | HR 71 | Temp 98.0°F | Resp 20 | Ht 60.0 in | Wt 198.0 lb

## 2021-03-02 DIAGNOSIS — N186 End stage renal disease: Secondary | ICD-10-CM

## 2021-03-02 DIAGNOSIS — M79603 Pain in arm, unspecified: Secondary | ICD-10-CM | POA: Insufficient documentation

## 2021-03-02 NOTE — Progress Notes (Signed)
Patient name: Jean Mcclain MRN: [id number] DOB: 1946-08-20 Sex: female  REASON FOR VISIT: Evaluate for right arm steal  HPI: Jean Mcclain is a 75 y.o. female with multiple medical problems including end-stage renal disease on dialysis Monday Wednesday Friday who presents for evaluation of right arm steal.  She describes over the last several months she has had some numbness and tingling during dialysis particularly toward the end of her session in her distal fingers.  She also has similar symptoms in her left hand as well (no dialysis access in left hand). She has a right brachiocephalic AV fistula placed in 2019 by Dr. Scot Dock.  She states she has been on hemodialysis since 2019.  She has only had 1 fistula.  She has no tissue loss in the right hand.  She is normally able to complete her dialysis sessions.  Past Medical History:  Diagnosis Date  . Acute and chronic respiratory failure (acute-on-chronic) ([place])   . Anxiety   . Arthritis    osteo  . Chronic kidney disease   . Diabetes mellitus, type 2 (HCC)    Type II  . DVT (deep venous thrombosis) ([place]) 03/2017   left popliteal  . Facet joint disease   . Facet joint disease    foot  . Fibula fracture 02/2017   left  . GERD (gastroesophageal reflux disease)   . H/O blood clots   . Hyperlipidemia   . Hypertension   . Macrocytic anemia   . Morbid obesity ([place])   . OSA (obstructive sleep apnea)    does not use CPAP   . Osteopenia   . Renal disorder    MWF dialysis  . Shortness of breath    with exertion  . Supplemental oxygen dependent   . Venous stasis ulcers ([place])   . Vitamin D deficiency     Past Surgical History:  Procedure Laterality Date  . ANKLE FUSION Left 06/24/2020   Procedure: LEFT TIBIOCALCANEAL FUSION;  Surgeon: [name], MD;  Location: [facility];  Service: Orthopedics;  Laterality: Left;  . AV FISTULA PLACEMENT Right 08/20/2018   Procedure: ARTERIOVENOUS (AV) FISTULA CREATION BRACHIOCEPHALIC;   Surgeon: Angelia Mould, MD;  Location: [facility];  Service: Vascular;  Laterality: Right;  . BREAST SURGERY Right 03/1997   biospy  . CARPAL TUNNEL RELEASE Left 2016  . HYSTEROSCOPY WITH D & C N/A 09/29/2020   Procedure: DILATATION AND CURETTAGE /HYSTEROSCOPY;  Surgeon: Mora Bellman, MD;  [facility];  Service: Gynecology;  Laterality: N/A;  . INSERTION OF MESH N/A 01/08/2013   Procedure: INSERTION OF MESH;  Surgeon: Earnstine Regal, MD;  [facility];  Service: General;  Laterality: N/A;  . IR THORACENTESIS ASP PLEURAL SPACE W/IMG GUIDE  04/06/2017  . JOINT REPLACEMENT Right 1999  . TOTAL KNEE ARTHROPLASTY Left 1999   Left  . TRIGGER FINGER RELEASE Left 11/2014  . TRIGGER FINGER RELEASE Right   . VENTRAL HERNIA REPAIR N/A 01/08/2013   Procedure: LAPAROSCOPIC VENTRAL HERNIA;  Surgeon: Earnstine Regal, MD;  [facility];  Service: General;  Laterality: N/A;    Family History  Problem Relation Age of Onset  . Leukemia Mother   . Hypertension Brother   . Emphysema Father     SOCIAL HISTORY: Social History   Tobacco Use  . Smoking status: Former Smoker    Years: 27.00    Types: Cigarettes    Quit date: 10/10/1981    Years since quitting: 39.4  .  Smokeless tobacco: Never Used  Substance Use Topics  . Alcohol use: No    No Known Allergies  Current Outpatient Medications  Medication Sig Dispense Refill  . acetaminophen (TYLENOL) 500 MG tablet Take 1 tablet (500 mg total) by mouth every 6 (six) hours as needed for mild pain or headache. 30 tablet 0  . albuterol (VENTOLIN HFA) 108 (90 Base) MCG/ACT inhaler Inhale 2 puffs into the lungs every 6 (six) hours as needed for wheezing or shortness of breath.     Marland Kitchen apixaban (ELIQUIS) 2.5 MG TABS tablet Take 2.5 mg by mouth 2 (two) times daily.    . B-D INS SYR ULTRAFINE 1CC/30G 30G X 1/2" 1 ML MISC 1 each by Other route daily.     . Blood Glucose Monitoring Suppl (ONETOUCH VERIO FLEX SYSTEM) w/Device KIT     . calcium acetate  (PHOSLO) 667 MG tablet Take 667 mg by mouth in the morning, at noon, in the evening, and at bedtime.     . carbidopa-levodopa (SINEMET IR) 10-100 MG tablet Take 1 tablet by mouth 2 (two) times daily.    . colchicine 0.6 MG tablet Take 0.3 mg by mouth daily.    Marland Kitchen docusate sodium (COLACE) 100 MG capsule Take 200 mg by mouth at bedtime.    Marland Kitchen doxercalciferol (HECTOROL) 4 MCG/2ML injection Inject 1 mL (2 mcg total) into the vein every Monday, Wednesday, and Friday with hemodialysis. 2 mL   . febuxostat (ULORIC) 40 MG tablet Take 40 mg by mouth daily.    Marland Kitchen gabapentin (NEURONTIN) 100 MG capsule Take 1 capsule (100 mg total) by mouth at bedtime.    Marland Kitchen glucose blood test strip 1 each by Other route daily.     . insulin NPH Human (NOVOLIN N) 100 UNIT/ML injection Inject 0.1 mLs (10 Units total) into the skin 2 (two) times daily at 8 am and 10 pm. (Patient taking differently: Inject 10 Units into the skin 2 (two) times daily before a meal.)    . Lancets (ONETOUCH DELICA PLUS [id number]) MISC 1 each by Other route daily.     . Multiple Vitamin (MULTIVITAMIN WITH MINERALS) TABS tablet Take 1 tablet by mouth daily.    . Nutritional Supplements (ADULT NUTRITIONAL SUPPLEMENT + PO) Take 1 tablet by mouth in the morning and at bedtime. Wound Healing    . omeprazole (PRILOSEC) 20 MG capsule Take 20 mg by mouth daily before breakfast.     . oxyCODONE (OXY IR/ROXICODONE) 5 MG immediate release tablet Take 1 tablet (5 mg total) by mouth every 6 (six) hours as needed for moderate pain or severe pain. 20 tablet 0  . oxyCODONE-acetaminophen (PERCOCET/ROXICET) 5-325 MG tablet Take 1 tablet by mouth every 4 (four) hours as needed. 10 tablet 0  . OXYGEN Inhale 2 L into the lungs as needed (shortness of breath).    Marland Kitchen rOPINIRole (REQUIP) 4 MG tablet Take 4 mg by mouth 2 (two) times daily.    . Zinc Oxide (TRIPLE PASTE) 12.8 % ointment Apply topically 4 (four) times daily. (Patient taking differently: Apply 1 application topically  4 (four) times daily.)     No current facility-administered medications for this visit.    REVIEW OF SYSTEMS:  '[X]'  denotes positive finding, '[ ]'  denotes negative finding Cardiac  Comments:  Chest pain or chest pressure:    Shortness of breath upon exertion:    Short of breath when lying flat:    Irregular heart rhythm:  Vascular    Pain in calf, thigh, or hip brought on by ambulation:    Pain in feet at night that wakes you up from your sleep:     Blood clot in your veins:    Leg swelling:         Pulmonary    Oxygen at home:    Productive cough:     Wheezing:         Neurologic    Sudden weakness in arms or legs:     Sudden numbness in arms or legs:  x Right hand  Sudden onset of difficulty speaking or slurred speech:    Temporary loss of vision in one eye:     Problems with dizziness:         Gastrointestinal    Blood in stool:     Vomited blood:         Genitourinary    Burning when urinating:     Blood in urine:        Psychiatric    Major depression:         Hematologic    Bleeding problems:    Problems with blood clotting too easily:        Skin    Rashes or ulcers:        Constitutional    Fever or chills:      PHYSICAL EXAM: Vitals:   03/02/21 1510  BP: 117/72  Pulse: 71  Resp: 20  Temp: 98 F (36.7 C)  SpO2: 93%  Weight: 198 lb (89.8 kg)  Height: 5' (1.524 m)    GENERAL: The patient is a well-nourished female, in no acute distress. The vital signs are documented above. CARDIAC: There is a regular rate and rhythm.  VASCULAR:  Right brachiocephalic AV fistula with good thrill Right radial pulse palpable and brachial pulse palpable No right upper extremity tissue loss PULMONARY: No respiratory distress ABDOMEN: Soft and non-tender. MUSCULOSKELETAL: There are no major deformities or cyanosis. NEUROLOGIC: No focal weakness or paresthesias are detected. SKIN: There are no ulcers or rashes noted. PSYCHIATRIC: The patient has a  normal affect.  DATA:   Right upper extremity steal study does show right digital pressure increase from 97 to 149.  Assessment/Plan:  75 year old female presents for evaluation of right upper extremity steal in the setting of right brachiocephalic AV fistula placed in 2019.  On exam she has a palpable radial pulse at the wrist even with her fistula patent and not compressed.  The steal study today does show some augmentation of digit pressure from 97 to 149 with manual compression of the fistula.  Discussed that certainly could be some component of steal but she also likely has some underlying component of neuropathy given she has symptoms in both upper extremities.  We discussed etiology for steal in detail and options for further intervention including upper extremity arteriogram to look for any proximal lesions and ultimately requiring either ligation of the fistula versus potential bypass in the upper extremity if she feels her symptoms are not tolerable.  She agrees that her symptoms are tolerable at this time.  She will let me know if things worsen.  Fortunately at this time she is completing her dialysis sessions in full and this is usually bothersome just at the end of the session.   Marty Heck, MD [facility] Office: [phone]

## 2021-03-04 DIAGNOSIS — L298 Other pruritus: Secondary | ICD-10-CM | POA: Diagnosis not present

## 2021-03-08 DIAGNOSIS — N186 End stage renal disease: Secondary | ICD-10-CM | POA: Diagnosis not present

## 2021-03-08 DIAGNOSIS — Z992 Dependence on renal dialysis: Secondary | ICD-10-CM | POA: Diagnosis not present

## 2021-03-08 DIAGNOSIS — N2581 Secondary hyperparathyroidism of renal origin: Secondary | ICD-10-CM | POA: Diagnosis not present

## 2021-03-09 DIAGNOSIS — N186 End stage renal disease: Secondary | ICD-10-CM | POA: Diagnosis not present

## 2021-03-09 DIAGNOSIS — M79673 Pain in unspecified foot: Secondary | ICD-10-CM | POA: Diagnosis not present

## 2021-03-09 DIAGNOSIS — R1110 Vomiting, unspecified: Secondary | ICD-10-CM | POA: Diagnosis not present

## 2021-03-09 DIAGNOSIS — E1022 Type 1 diabetes mellitus with diabetic chronic kidney disease: Secondary | ICD-10-CM | POA: Diagnosis not present

## 2021-03-09 DIAGNOSIS — K219 Gastro-esophageal reflux disease without esophagitis: Secondary | ICD-10-CM | POA: Diagnosis not present

## 2021-03-09 DIAGNOSIS — Z992 Dependence on renal dialysis: Secondary | ICD-10-CM | POA: Diagnosis not present

## 2021-03-10 DIAGNOSIS — Z992 Dependence on renal dialysis: Secondary | ICD-10-CM | POA: Diagnosis not present

## 2021-03-10 DIAGNOSIS — N2581 Secondary hyperparathyroidism of renal origin: Secondary | ICD-10-CM | POA: Diagnosis not present

## 2021-03-10 DIAGNOSIS — E876 Hypokalemia: Secondary | ICD-10-CM | POA: Diagnosis not present

## 2021-03-10 DIAGNOSIS — Z794 Long term (current) use of insulin: Secondary | ICD-10-CM | POA: Diagnosis not present

## 2021-03-10 DIAGNOSIS — N186 End stage renal disease: Secondary | ICD-10-CM | POA: Diagnosis not present

## 2021-03-10 DIAGNOSIS — E119 Type 2 diabetes mellitus without complications: Secondary | ICD-10-CM | POA: Diagnosis not present

## 2021-03-11 ENCOUNTER — Ambulatory Visit: Payer: Self-pay

## 2021-03-11 ENCOUNTER — Ambulatory Visit (INDEPENDENT_AMBULATORY_CARE_PROVIDER_SITE_OTHER): Payer: HMO | Admitting: Orthopedic Surgery

## 2021-03-11 DIAGNOSIS — M25572 Pain in left ankle and joints of left foot: Secondary | ICD-10-CM | POA: Diagnosis not present

## 2021-03-11 DIAGNOSIS — M25512 Pain in left shoulder: Secondary | ICD-10-CM

## 2021-03-11 DIAGNOSIS — M25511 Pain in right shoulder: Secondary | ICD-10-CM

## 2021-03-11 DIAGNOSIS — M24311 Pathological dislocation of right shoulder, not elsewhere classified: Secondary | ICD-10-CM | POA: Diagnosis not present

## 2021-03-11 DIAGNOSIS — G8929 Other chronic pain: Secondary | ICD-10-CM

## 2021-03-11 DIAGNOSIS — M24312 Pathological dislocation of left shoulder, not elsewhere classified: Secondary | ICD-10-CM

## 2021-03-15 DIAGNOSIS — N186 End stage renal disease: Secondary | ICD-10-CM | POA: Diagnosis not present

## 2021-03-15 DIAGNOSIS — Z992 Dependence on renal dialysis: Secondary | ICD-10-CM | POA: Diagnosis not present

## 2021-03-15 DIAGNOSIS — N2581 Secondary hyperparathyroidism of renal origin: Secondary | ICD-10-CM | POA: Diagnosis not present

## 2021-03-18 DIAGNOSIS — Z9181 History of falling: Secondary | ICD-10-CM | POA: Diagnosis not present

## 2021-03-18 DIAGNOSIS — Z7901 Long term (current) use of anticoagulants: Secondary | ICD-10-CM | POA: Diagnosis not present

## 2021-03-18 DIAGNOSIS — Z7401 Bed confinement status: Secondary | ICD-10-CM | POA: Diagnosis not present

## 2021-03-18 DIAGNOSIS — Z86718 Personal history of other venous thrombosis and embolism: Secondary | ICD-10-CM | POA: Diagnosis not present

## 2021-03-18 DIAGNOSIS — Z794 Long term (current) use of insulin: Secondary | ICD-10-CM | POA: Diagnosis not present

## 2021-03-18 DIAGNOSIS — Z992 Dependence on renal dialysis: Secondary | ICD-10-CM | POA: Diagnosis not present

## 2021-03-18 DIAGNOSIS — F331 Major depressive disorder, recurrent, moderate: Secondary | ICD-10-CM | POA: Diagnosis not present

## 2021-03-18 DIAGNOSIS — Z7951 Long term (current) use of inhaled steroids: Secondary | ICD-10-CM | POA: Diagnosis not present

## 2021-03-18 DIAGNOSIS — Z9981 Dependence on supplemental oxygen: Secondary | ICD-10-CM | POA: Diagnosis not present

## 2021-03-18 DIAGNOSIS — E232 Diabetes insipidus: Secondary | ICD-10-CM | POA: Diagnosis not present

## 2021-03-18 DIAGNOSIS — E1122 Type 2 diabetes mellitus with diabetic chronic kidney disease: Secondary | ICD-10-CM | POA: Diagnosis not present

## 2021-03-18 DIAGNOSIS — Z8616 Personal history of COVID-19: Secondary | ICD-10-CM | POA: Diagnosis not present

## 2021-03-18 DIAGNOSIS — M6281 Muscle weakness (generalized): Secondary | ICD-10-CM | POA: Diagnosis not present

## 2021-03-18 DIAGNOSIS — M19072 Primary osteoarthritis, left ankle and foot: Secondary | ICD-10-CM | POA: Diagnosis not present

## 2021-03-18 DIAGNOSIS — I120 Hypertensive chronic kidney disease with stage 5 chronic kidney disease or end stage renal disease: Secondary | ICD-10-CM | POA: Diagnosis not present

## 2021-03-18 DIAGNOSIS — Z6835 Body mass index (BMI) 35.0-35.9, adult: Secondary | ICD-10-CM | POA: Diagnosis not present

## 2021-03-18 DIAGNOSIS — E785 Hyperlipidemia, unspecified: Secondary | ICD-10-CM | POA: Diagnosis not present

## 2021-03-18 DIAGNOSIS — F419 Anxiety disorder, unspecified: Secondary | ICD-10-CM | POA: Diagnosis not present

## 2021-03-18 DIAGNOSIS — N186 End stage renal disease: Secondary | ICD-10-CM | POA: Diagnosis not present

## 2021-03-18 DIAGNOSIS — S82422D Displaced transverse fracture of shaft of left fibula, subsequent encounter for closed fracture with routine healing: Secondary | ICD-10-CM | POA: Diagnosis not present

## 2021-03-21 ENCOUNTER — Encounter: Payer: Self-pay | Admitting: Orthopedic Surgery

## 2021-03-21 NOTE — Progress Notes (Signed)
Office Visit Note   Patient: Jean Mcclain           Date of Birth: 1945-10-14           MRN: [id number] Visit Date: 03/11/2021              Requested by: Leanna Battles, [place],  [place] [address] PCP: Leanna Battles, MD  Chief Complaint  Patient presents with   Right Shoulder - Pain   Left Shoulder - Pain      HPI: Patient is a 75 year old woman who was seen for evaluation for chronically dislocated bilateral shoulders.  Patient had a chest x-ray in June 18, 2010 21 which showed dislocation of both shoulders.  Patient states at that time she was dropped from a [facility] lift.  Patient subsequently had repeat radiographs on July 21, 2020.  At that time she was referred to the emergency room and the physician on-call recommended follow-up electively due to the chronic nature of the dislocation.  Repeat radiographs on 6-22's shows the persistent dislocated bilateral shoulders.  Assessment & Plan: Visit Diagnoses:  1. Chronic left shoulder pain   2. Chronic right shoulder pain   3. Pathological dislocation of shoulder joint, bilateral     Plan: I discussed the patient's case with Dr. Griffin Basil he agreed to see the patient in follow-up for the chronic dislocated shoulders to see if surgical intervention would be beneficial.  Follow-Up Instructions: Return if symptoms worsen or fail to improve, for Plan to follow-up with Dr. Griffin Basil.   Ortho Exam  Patient is alert, oriented, no adenopathy, well-dressed, normal affect, normal respiratory effort. Examination patient does have passive range of motion of both shoulders this is limited and painful.  Examination of her lower extremities she has an ankle with dorsiflexion to neutral and pain to palpation over the plantar fascia.  Patient states she is obtaining orthotics from [facility] with a shoe and brace to protect her foot.  Patient does have thickened discolored onychomycotic nails the nails were trimmed X91 without  complications.  Imaging: No results found. No images are attached to the encounter.  Labs: Lab Results  Component Value Date   HGBA1C 6.6 (H) 09/29/2020   HGBA1C 6.3 (H) 01/22/2020   HGBA1C 7.1 (H) 03/11/2017   CRP 5.4 (H) 01/23/2020   LABURIC 1.5 (L) 01/24/2020   LABURIC 5.0 02/09/2017   REPTSTATUS 04/11/2017 FINAL 04/06/2017   REPTSTATUS 04/08/2017 FINAL 04/06/2017   GRAMSTAIN NO WBC SEEN NO ORGANISMS SEEN  04/06/2017   CULT NO GROWTH 5 DAYS 04/06/2017   LABORGA ESCHERICHIA COLI 03/30/2017     Lab Results  Component Value Date   ALBUMIN 2.4 (L) 07/28/2020   ALBUMIN 2.3 (L) 06/27/2020   ALBUMIN 2.5 (L) 06/25/2020    Lab Results  Component Value Date   MG 2.2 01/25/2020   MG 1.4 (L) 01/24/2020   MG 2.2 04/02/2017   No results found for: VD25OH  No results found for: PREALBUMIN CBC EXTENDED Latest Ref Rng & Units 09/29/2020 07/28/2020 06/27/2020  WBC 4.0 - 10.5 K/uL 9.2 9.0 10.7(H)  RBC 3.87 - 5.11 MIL/uL 3.87 3.34(L) 2.86(L)  HGB 12.0 - 15.0 g/dL 11.6(L) 10.1(L) 8.6(L)  HCT 36.0 - 46.0 % 38.4 36.1 29.3(L)  PLT 150 - 400 K/uL 196 229 258  NEUTROABS 1.7 - 7.7 K/uL - - -  LYMPHSABS 0.7 - 4.0 K/uL - - -     There is no height or weight on file to calculate BMI.  Orders:  Orders Placed This Encounter  Procedures   XR Shoulder Right   XR Shoulder Left   No orders of the defined types were placed in this encounter.    Procedures: No procedures performed  Clinical Data: No additional findings.  ROS:  All other systems negative, except as noted in the HPI. Review of Systems  Objective: Vital Signs: There were no vitals taken for this visit.  Specialty Comments:  No specialty comments available.  PMFS History: Patient Active Problem List   Diagnosis Date Noted   Postmenopausal vaginal bleeding    Acute blood loss anemia 06/25/2020   Acute on chronic respiratory failure with hypoxia and hypercapnia (HCC) 06/25/2020   Advanced care  planning/counseling discussion    Goals of care, counseling/discussion    Obstructive sleep apnea    Primary osteoarthritis, left ankle and foot    Ankle fracture 06/14/2020   HLD (hyperlipidemia) 06/14/2020   Dyspnea, unspecified 02/05/2020   Intractable pain 01/24/2020   Bilateral shoulder pain 01/23/2020   Right knee pain 01/23/2020   Uncontrolled type 2 diabetes mellitus with hyperglycemia, with long-term current use of insulin ([place]) 01/23/2020   Pressure injury of skin 01/22/2020   Mild protein-calorie malnutrition ([place]) 12/16/2019   Personal history of anaphylaxis 12/05/2019   Pruritus, unspecified 12/05/2019   ESRD on hemodialysis ([place]) 11/20/2019   Myoclonus, segmental 11/20/2019   Allergy, unspecified, initial encounter 07/29/2019   Anemia in chronic kidney disease 07/15/2019   Coagulation defect, unspecified ([place]) 07/13/2019   Iron deficiency anemia, unspecified 07/11/2019   Encounter for immunization 07/06/2019   End stage renal disease ([place]) 07/02/2019   Gastro-esophageal reflux disease without esophagitis 07/02/2019   Low back pain 07/02/2019   BMI 34.0-34.9,adult 07/02/2019   Other specified disorders of bone density and structure, unspecified site 07/02/2019   Secondary hyperparathyroidism of renal origin ([place]) 07/02/2019   Unspecified osteoarthritis, unspecified site 07/02/2019   OSA (obstructive sleep apnea) 03/13/2019   Chronic acquired lymphedema 11/05/2018   Acute deep vein thrombosis (DVT) of popliteal vein of left lower extremity ([place]) 03/11/2017   Insulin-requiring or dependent type II diabetes mellitus ([place]) 03/11/2017   Essential hypertension 03/11/2017   Impingement syndrome of left ankle 01/31/2017   Posterior tibial tendinitis, left leg 01/31/2017   Incarcerated ventral hernia 01/02/2013   Past Medical History:  Diagnosis Date   Acute and chronic respiratory failure (acute-on-chronic) (HCC)    Anxiety    Arthritis    osteo   Chronic kidney  disease    Diabetes mellitus, type 2 ([place])    Type II   DVT (deep venous thrombosis) ([place]) 03/2017   left popliteal   Facet joint disease    Facet joint disease    foot   Fibula fracture 02/2017   left   GERD (gastroesophageal reflux disease)    H/O blood clots    Hyperlipidemia    Hypertension    Macrocytic anemia    Morbid obesity (HCC)    OSA (obstructive sleep apnea)    does not use CPAP    Osteopenia    Renal disorder    MWF dialysis   Shortness of breath    with exertion   Supplemental oxygen dependent    Venous stasis ulcers (HCC)    Vitamin D deficiency     Family History  Problem Relation Age of Onset   Leukemia Mother    Hypertension Brother    Emphysema Father     Past Surgical History:  Procedure Laterality Date  ANKLE FUSION Left 06/24/2020   Procedure: LEFT TIBIOCALCANEAL FUSION;  Surgeon: [name], MD;  Location: [facility];  Service: Orthopedics;  Laterality: Left;   AV FISTULA PLACEMENT Right 08/20/2018   Procedure: ARTERIOVENOUS (AV) FISTULA CREATION BRACHIOCEPHALIC;  Surgeon: Angelia Mould, MD;  Location: [facility];  Service: Vascular;  Laterality: Right;   BREAST SURGERY Right 03/1997   biospy   CARPAL TUNNEL RELEASE Left 2016   HYSTEROSCOPY WITH D & C N/A 09/29/2020   Procedure: DILATATION AND CURETTAGE /HYSTEROSCOPY;  Surgeon: Mora Bellman, MD;  Location: [facility];  Service: Gynecology;  Laterality: N/A;   INSERTION OF MESH N/A 01/08/2013   Procedure: INSERTION OF MESH;  Surgeon: Earnstine Regal, MD;  Location: [facility];  Service: General;  Laterality: N/A;   IR THORACENTESIS ASP PLEURAL SPACE W/IMG GUIDE  04/06/2017   JOINT REPLACEMENT Right 1999   TOTAL KNEE ARTHROPLASTY Left 1999   Left   TRIGGER FINGER RELEASE Left 11/2014   TRIGGER FINGER RELEASE Right    VENTRAL HERNIA REPAIR N/A 01/08/2013   Procedure: LAPAROSCOPIC VENTRAL HERNIA;  Surgeon: Earnstine Regal, MD;  Location: [facility];  Service: General;  Laterality: N/A;   Social History    Occupational History   Not on file  Tobacco Use   Smoking status: Former    Years: 27.00    Pack years: 0.00    Types: Cigarettes    Quit date: 10/10/1981    Years since quitting: 39.4   Smokeless tobacco: Never  Vaping Use   Vaping Use: Never used  Substance and Sexual Activity   Alcohol use: No   Drug use: No   Sexual activity: Not on file

## 2021-03-22 DIAGNOSIS — N2581 Secondary hyperparathyroidism of renal origin: Secondary | ICD-10-CM | POA: Diagnosis not present

## 2021-03-22 DIAGNOSIS — Z992 Dependence on renal dialysis: Secondary | ICD-10-CM | POA: Diagnosis not present

## 2021-03-22 DIAGNOSIS — E876 Hypokalemia: Secondary | ICD-10-CM | POA: Diagnosis not present

## 2021-03-22 DIAGNOSIS — Z23 Encounter for immunization: Secondary | ICD-10-CM | POA: Diagnosis not present

## 2021-03-22 DIAGNOSIS — N186 End stage renal disease: Secondary | ICD-10-CM | POA: Diagnosis not present

## 2021-03-27 DIAGNOSIS — M19072 Primary osteoarthritis, left ankle and foot: Secondary | ICD-10-CM | POA: Diagnosis not present

## 2021-03-27 DIAGNOSIS — Z993 Dependence on wheelchair: Secondary | ICD-10-CM | POA: Diagnosis not present

## 2021-03-27 DIAGNOSIS — Z9181 History of falling: Secondary | ICD-10-CM | POA: Diagnosis not present

## 2021-03-27 DIAGNOSIS — S82422D Displaced transverse fracture of shaft of left fibula, subsequent encounter for closed fracture with routine healing: Secondary | ICD-10-CM | POA: Diagnosis not present

## 2021-03-27 DIAGNOSIS — M6281 Muscle weakness (generalized): Secondary | ICD-10-CM | POA: Diagnosis not present

## 2021-03-29 DIAGNOSIS — Z992 Dependence on renal dialysis: Secondary | ICD-10-CM | POA: Diagnosis not present

## 2021-03-29 DIAGNOSIS — E876 Hypokalemia: Secondary | ICD-10-CM | POA: Diagnosis not present

## 2021-03-29 DIAGNOSIS — N186 End stage renal disease: Secondary | ICD-10-CM | POA: Diagnosis not present

## 2021-03-29 DIAGNOSIS — N2581 Secondary hyperparathyroidism of renal origin: Secondary | ICD-10-CM | POA: Diagnosis not present

## 2021-03-31 ENCOUNTER — Telehealth: Payer: Self-pay | Admitting: Pulmonary Disease

## 2021-03-31 DIAGNOSIS — G4733 Obstructive sleep apnea (adult) (pediatric): Secondary | ICD-10-CM

## 2021-03-31 NOTE — Telephone Encounter (Signed)
Called and spoke to [place] with the sleep center and was advised the pt cannot have a sleep study in the facility due to the pt needing a hoyer lift and has mobility issues. Because we are trying to get pt qualified for a bipap (see phone note from 01/26/21) we will have to qualify her another way per Lynnae Sandhoff. Lynnae Sandhoff states we can start with a HST and prove she has OSA then prescribe CPAP with auto titration. We can monitor her AHI and if it continues to be uncontrolled with pressure adjustments then we can order a bilevel. It may require additional work to get pt approved with insurance. This is because pt is not able to have a titration study done in lab.    Dr. Ander Slade, please advise if ok to order a HST first. Thanks.

## 2021-03-31 NOTE — Telephone Encounter (Signed)
Placed order for HST. Forwarding to PCCs as FYI. Nothing further needed at this time.

## 2021-03-31 NOTE — Telephone Encounter (Signed)
Can start off with the HST  thanks

## 2021-03-31 NOTE — Telephone Encounter (Signed)
03/29/21: THE PATIENTS APPT. WAS CANCEL DUE TO PHYSICAL LIMITATIONS. THE PATIENT REQUIRES A HOYER LIFT FOR TRANSFERS, THEREFORE THE [facility] CAN NOT ACCOMMODATE THE PATIENT AT THIS TIME. A HSAT STUDY WAS SUGGESTED TO THE PROVIDER FOR PATIENT TESTING...vb

## 2021-04-01 ENCOUNTER — Encounter (HOSPITAL_BASED_OUTPATIENT_CLINIC_OR_DEPARTMENT_OTHER): Payer: HMO | Admitting: Pulmonary Disease

## 2021-04-05 DIAGNOSIS — E876 Hypokalemia: Secondary | ICD-10-CM | POA: Diagnosis not present

## 2021-04-05 DIAGNOSIS — N2581 Secondary hyperparathyroidism of renal origin: Secondary | ICD-10-CM | POA: Diagnosis not present

## 2021-04-05 DIAGNOSIS — Z992 Dependence on renal dialysis: Secondary | ICD-10-CM | POA: Diagnosis not present

## 2021-04-05 DIAGNOSIS — N186 End stage renal disease: Secondary | ICD-10-CM | POA: Diagnosis not present

## 2021-04-06 DIAGNOSIS — Z0000 Encounter for general adult medical examination without abnormal findings: Secondary | ICD-10-CM | POA: Diagnosis not present

## 2021-04-06 DIAGNOSIS — G20 Parkinson's disease: Secondary | ICD-10-CM | POA: Diagnosis not present

## 2021-04-08 ENCOUNTER — Ambulatory Visit: Payer: HMO | Admitting: Orthopedic Surgery

## 2021-04-08 DIAGNOSIS — M25511 Pain in right shoulder: Secondary | ICD-10-CM | POA: Diagnosis not present

## 2021-04-08 DIAGNOSIS — M24312 Pathological dislocation of left shoulder, not elsewhere classified: Secondary | ICD-10-CM

## 2021-04-08 DIAGNOSIS — M24311 Pathological dislocation of right shoulder, not elsewhere classified: Secondary | ICD-10-CM

## 2021-04-08 DIAGNOSIS — G8929 Other chronic pain: Secondary | ICD-10-CM

## 2021-04-08 DIAGNOSIS — E1022 Type 1 diabetes mellitus with diabetic chronic kidney disease: Secondary | ICD-10-CM | POA: Diagnosis not present

## 2021-04-08 DIAGNOSIS — M25512 Pain in left shoulder: Secondary | ICD-10-CM | POA: Diagnosis not present

## 2021-04-08 DIAGNOSIS — N186 End stage renal disease: Secondary | ICD-10-CM | POA: Diagnosis not present

## 2021-04-08 DIAGNOSIS — Z992 Dependence on renal dialysis: Secondary | ICD-10-CM | POA: Diagnosis not present

## 2021-04-09 DIAGNOSIS — N2581 Secondary hyperparathyroidism of renal origin: Secondary | ICD-10-CM | POA: Diagnosis not present

## 2021-04-09 DIAGNOSIS — Z794 Long term (current) use of insulin: Secondary | ICD-10-CM | POA: Diagnosis not present

## 2021-04-09 DIAGNOSIS — Z992 Dependence on renal dialysis: Secondary | ICD-10-CM | POA: Diagnosis not present

## 2021-04-09 DIAGNOSIS — E119 Type 2 diabetes mellitus without complications: Secondary | ICD-10-CM | POA: Diagnosis not present

## 2021-04-09 DIAGNOSIS — N186 End stage renal disease: Secondary | ICD-10-CM | POA: Diagnosis not present

## 2021-04-12 DIAGNOSIS — N186 End stage renal disease: Secondary | ICD-10-CM | POA: Diagnosis not present

## 2021-04-12 DIAGNOSIS — Z992 Dependence on renal dialysis: Secondary | ICD-10-CM | POA: Diagnosis not present

## 2021-04-12 DIAGNOSIS — N2581 Secondary hyperparathyroidism of renal origin: Secondary | ICD-10-CM | POA: Diagnosis not present

## 2021-04-12 DIAGNOSIS — E876 Hypokalemia: Secondary | ICD-10-CM | POA: Diagnosis not present

## 2021-04-13 ENCOUNTER — Encounter: Payer: Self-pay | Admitting: Orthopedic Surgery

## 2021-04-13 NOTE — Progress Notes (Signed)
Office Visit Note   Patient: Jean Mcclain           Date of Birth: 1946/04/18           MRN: [id number] Visit Date: 04/08/2021              Requested by: Leanna Battles, MD [address] [place],  [place] [address] PCP: Leanna Battles, MD  Chief Complaint  Patient presents with   Left Shoulder - Follow-up   Right Shoulder - Follow-up      HPI: Patient is a 75 year old woman who is seen in follow-up for chronic dislocated shoulders essentially 1 year out from her initial dislocations.  Patient states she recently has had COVID on March 4 and April 2.  Patient has had no relief with her shoulder injections.  Patient also has extra-depth shoes and custom orthotics undergoing fabrication of that are still not ready yet.  She denies any problem with her feet.  Patient had been referred to Dr. Griffin Basil previously.  Assessment & Plan: Visit Diagnoses:  1. Chronic left shoulder pain   2. Chronic right shoulder pain   3. Pathological dislocation of shoulder joint, bilateral     Plan: Patient was given the contact information for Dr. Rich Fuchs office.  She will call to set up an appointment.  Follow-Up Instructions: Return if symptoms worsen or fail to improve.   Ortho Exam  Patient is alert, oriented, no adenopathy, well-dressed, normal affect, normal respiratory effort. Examination patient has limited range of motion of both shoulders.  She has no plantar ulcers on her feet.  Imaging: No results found. No images are attached to the encounter.  Labs: Lab Results  Component Value Date   HGBA1C 6.6 (H) 09/29/2020   HGBA1C 6.3 (H) 01/22/2020   HGBA1C 7.1 (H) 03/11/2017   CRP 5.4 (H) 01/23/2020   LABURIC 1.5 (L) 01/24/2020   LABURIC 5.0 02/09/2017   REPTSTATUS 04/11/2017 FINAL 04/06/2017   REPTSTATUS 04/08/2017 FINAL 04/06/2017   GRAMSTAIN NO WBC SEEN NO ORGANISMS SEEN  04/06/2017   CULT NO GROWTH 5 DAYS 04/06/2017   LABORGA ESCHERICHIA COLI 03/30/2017     Lab  Results  Component Value Date   ALBUMIN 2.4 (L) 07/28/2020   ALBUMIN 2.3 (L) 06/27/2020   ALBUMIN 2.5 (L) 06/25/2020    Lab Results  Component Value Date   MG 2.2 01/25/2020   MG 1.4 (L) 01/24/2020   MG 2.2 04/02/2017   No results found for: VD25OH  No results found for: PREALBUMIN CBC EXTENDED Latest Ref Rng & Units 09/29/2020 07/28/2020 06/27/2020  WBC 4.0 - 10.5 K/uL 9.2 9.0 10.7(H)  RBC 3.87 - 5.11 MIL/uL 3.87 3.34(L) 2.86(L)  HGB 12.0 - 15.0 g/dL 11.6(L) 10.1(L) 8.6(L)  HCT 36.0 - 46.0 % 38.4 36.1 29.3(L)  PLT 150 - 400 K/uL 196 229 258  NEUTROABS 1.7 - 7.7 K/uL - - -  LYMPHSABS 0.7 - 4.0 K/uL - - -     There is no height or weight on file to calculate BMI.  Orders:  No orders of the defined types were placed in this encounter.  No orders of the defined types were placed in this encounter.    Procedures: No procedures performed  Clinical Data: No additional findings.  ROS:  All other systems negative, except as noted in the HPI. Review of Systems  Objective: Vital Signs: There were no vitals taken for this visit.  Specialty Comments:  No specialty comments available.  PMFS History: Patient Active  Problem List   Diagnosis Date Noted   Postmenopausal vaginal bleeding    Acute blood loss anemia 06/25/2020   Acute on chronic respiratory failure with hypoxia and hypercapnia (HCC) 06/25/2020   Advanced care planning/counseling discussion    Goals of care, counseling/discussion    Obstructive sleep apnea    Primary osteoarthritis, left ankle and foot    Ankle fracture 06/14/2020   HLD (hyperlipidemia) 06/14/2020   Dyspnea, unspecified 02/05/2020   Intractable pain 01/24/2020   Bilateral shoulder pain 01/23/2020   Right knee pain 01/23/2020   Uncontrolled type 2 diabetes mellitus with hyperglycemia, with long-term current use of insulin ([facility]) 01/23/2020   Pressure injury of skin 01/22/2020   Mild protein-calorie malnutrition ([place]) 12/16/2019    Personal history of anaphylaxis 12/05/2019   Pruritus, unspecified 12/05/2019   ESRD on hemodialysis ([place]) 11/20/2019   Myoclonus, segmental 11/20/2019   Allergy, unspecified, initial encounter 07/29/2019   Anemia in chronic kidney disease 07/15/2019   Coagulation defect, unspecified ([place]) 07/13/2019   Iron deficiency anemia, unspecified 07/11/2019   Encounter for immunization 07/06/2019   End stage renal disease ([place]) 07/02/2019   Gastro-esophageal reflux disease without esophagitis 07/02/2019   Low back pain 07/02/2019   BMI 34.0-34.9,adult 07/02/2019   Other specified disorders of bone density and structure, unspecified site 07/02/2019   Secondary hyperparathyroidism of renal origin ([place]) 07/02/2019   Unspecified osteoarthritis, unspecified site 07/02/2019   OSA (obstructive sleep apnea) 03/13/2019   Chronic acquired lymphedema 11/05/2018   Acute deep vein thrombosis (DVT) of popliteal vein of left lower extremity ([place]) 03/11/2017   Insulin-requiring or dependent type II diabetes mellitus ([place]) 03/11/2017   Essential hypertension 03/11/2017   Impingement syndrome of left ankle 01/31/2017   Posterior tibial tendinitis, left leg 01/31/2017   Incarcerated ventral hernia 01/02/2013   Past Medical History:  Diagnosis Date   Acute and chronic respiratory failure (acute-on-chronic) (HCC)    Anxiety    Arthritis    osteo   Chronic kidney disease    Diabetes mellitus, type 2 ([place])    Type II   DVT (deep venous thrombosis) ([place]) 03/2017   left popliteal   Facet joint disease    Facet joint disease    foot   Fibula fracture 02/2017   left   GERD (gastroesophageal reflux disease)    H/O blood clots    Hyperlipidemia    Hypertension    Macrocytic anemia    Morbid obesity (HCC)    OSA (obstructive sleep apnea)    does not use CPAP    Osteopenia    Renal disorder    MWF dialysis   Shortness of breath    with exertion   Supplemental oxygen dependent    Venous stasis ulcers  (HCC)    Vitamin D deficiency     Family History  Problem Relation Age of Onset   Leukemia Mother    Hypertension Brother    Emphysema Father     Past Surgical History:  Procedure Laterality Date   ANKLE FUSION Left 06/24/2020   Procedure: LEFT TIBIOCALCANEAL FUSION;  Surgeon: [name], MD;  Location: [facility];  Service: Orthopedics;  Laterality: Left;   AV FISTULA PLACEMENT Right 08/20/2018   Procedure: ARTERIOVENOUS (AV) FISTULA CREATION BRACHIOCEPHALIC;  Surgeon: Angelia Mould, MD;  Location: [facility];  Service: Vascular;  Laterality: Right;   BREAST SURGERY Right 03/1997   biospy   CARPAL TUNNEL RELEASE Left 2016   HYSTEROSCOPY WITH D & C N/A 09/29/2020  Procedure: DILATATION AND CURETTAGE /HYSTEROSCOPY;  Surgeon: Mora Bellman, MD;  [facility];  Service: Gynecology;  Laterality: N/A;   INSERTION OF MESH N/A 01/08/2013   Procedure: INSERTION OF MESH;  Surgeon: Earnstine Regal, MD;  [facility];  Service: General;  Laterality: N/A;   IR THORACENTESIS ASP PLEURAL SPACE W/IMG GUIDE  04/06/2017   JOINT REPLACEMENT Right 1999   TOTAL KNEE ARTHROPLASTY Left 1999   Left   TRIGGER FINGER RELEASE Left 11/2014   TRIGGER FINGER RELEASE Right    VENTRAL HERNIA REPAIR N/A 01/08/2013   Procedure: LAPAROSCOPIC VENTRAL HERNIA;  Surgeon: Earnstine Regal, MD;  [facility];  Service: General;  Laterality: N/A;   Social History   Occupational History   Not on file  Tobacco Use   Smoking status: Former    Years: 27.00    Pack years: 0.00    Types: Cigarettes    Quit date: 10/10/1981    Years since quitting: 39.5   Smokeless tobacco: Never  Vaping Use   Vaping Use: Never used  Substance and Sexual Activity   Alcohol use: No   Drug use: No   Sexual activity: Not on file

## 2021-04-16 DIAGNOSIS — F419 Anxiety disorder, unspecified: Secondary | ICD-10-CM | POA: Diagnosis not present

## 2021-04-16 DIAGNOSIS — F331 Major depressive disorder, recurrent, moderate: Secondary | ICD-10-CM | POA: Diagnosis not present

## 2021-04-19 DIAGNOSIS — N2581 Secondary hyperparathyroidism of renal origin: Secondary | ICD-10-CM | POA: Diagnosis not present

## 2021-04-19 DIAGNOSIS — N186 End stage renal disease: Secondary | ICD-10-CM | POA: Diagnosis not present

## 2021-04-19 DIAGNOSIS — Z992 Dependence on renal dialysis: Secondary | ICD-10-CM | POA: Diagnosis not present

## 2021-04-26 DIAGNOSIS — M19072 Primary osteoarthritis, left ankle and foot: Secondary | ICD-10-CM | POA: Diagnosis not present

## 2021-04-26 DIAGNOSIS — M6281 Muscle weakness (generalized): Secondary | ICD-10-CM | POA: Diagnosis not present

## 2021-04-26 DIAGNOSIS — S82422D Displaced transverse fracture of shaft of left fibula, subsequent encounter for closed fracture with routine healing: Secondary | ICD-10-CM | POA: Diagnosis not present

## 2021-04-26 DIAGNOSIS — N186 End stage renal disease: Secondary | ICD-10-CM | POA: Diagnosis not present

## 2021-04-26 DIAGNOSIS — Z9181 History of falling: Secondary | ICD-10-CM | POA: Diagnosis not present

## 2021-04-26 DIAGNOSIS — Z992 Dependence on renal dialysis: Secondary | ICD-10-CM | POA: Diagnosis not present

## 2021-04-26 DIAGNOSIS — N2581 Secondary hyperparathyroidism of renal origin: Secondary | ICD-10-CM | POA: Diagnosis not present

## 2021-04-26 DIAGNOSIS — Z993 Dependence on wheelchair: Secondary | ICD-10-CM | POA: Diagnosis not present

## 2021-04-27 DIAGNOSIS — M25512 Pain in left shoulder: Secondary | ICD-10-CM | POA: Diagnosis not present

## 2021-04-27 DIAGNOSIS — M25511 Pain in right shoulder: Secondary | ICD-10-CM | POA: Diagnosis not present

## 2021-05-03 DIAGNOSIS — N2581 Secondary hyperparathyroidism of renal origin: Secondary | ICD-10-CM | POA: Diagnosis not present

## 2021-05-03 DIAGNOSIS — N186 End stage renal disease: Secondary | ICD-10-CM | POA: Diagnosis not present

## 2021-05-03 DIAGNOSIS — Z992 Dependence on renal dialysis: Secondary | ICD-10-CM | POA: Diagnosis not present

## 2021-05-03 DIAGNOSIS — E876 Hypokalemia: Secondary | ICD-10-CM | POA: Diagnosis not present

## 2021-05-04 DIAGNOSIS — Z794 Long term (current) use of insulin: Secondary | ICD-10-CM | POA: Diagnosis not present

## 2021-05-04 DIAGNOSIS — Z79899 Other long term (current) drug therapy: Secondary | ICD-10-CM | POA: Diagnosis not present

## 2021-05-04 DIAGNOSIS — Z992 Dependence on renal dialysis: Secondary | ICD-10-CM | POA: Diagnosis not present

## 2021-05-04 DIAGNOSIS — E1149 Type 2 diabetes mellitus with other diabetic neurological complication: Secondary | ICD-10-CM | POA: Diagnosis not present

## 2021-05-04 DIAGNOSIS — G20 Parkinson's disease: Secondary | ICD-10-CM | POA: Diagnosis not present

## 2021-05-04 DIAGNOSIS — N186 End stage renal disease: Secondary | ICD-10-CM | POA: Diagnosis not present

## 2021-05-04 DIAGNOSIS — E1152 Type 2 diabetes mellitus with diabetic peripheral angiopathy with gangrene: Secondary | ICD-10-CM | POA: Diagnosis not present

## 2021-05-09 DIAGNOSIS — N186 End stage renal disease: Secondary | ICD-10-CM | POA: Diagnosis not present

## 2021-05-09 DIAGNOSIS — E1022 Type 1 diabetes mellitus with diabetic chronic kidney disease: Secondary | ICD-10-CM | POA: Diagnosis not present

## 2021-05-09 DIAGNOSIS — Z992 Dependence on renal dialysis: Secondary | ICD-10-CM | POA: Diagnosis not present

## 2021-05-10 DIAGNOSIS — E876 Hypokalemia: Secondary | ICD-10-CM | POA: Diagnosis not present

## 2021-05-10 DIAGNOSIS — Z992 Dependence on renal dialysis: Secondary | ICD-10-CM | POA: Diagnosis not present

## 2021-05-10 DIAGNOSIS — N2581 Secondary hyperparathyroidism of renal origin: Secondary | ICD-10-CM | POA: Diagnosis not present

## 2021-05-10 DIAGNOSIS — N186 End stage renal disease: Secondary | ICD-10-CM | POA: Diagnosis not present

## 2021-05-10 DIAGNOSIS — E119 Type 2 diabetes mellitus without complications: Secondary | ICD-10-CM | POA: Diagnosis not present

## 2021-05-10 DIAGNOSIS — Z794 Long term (current) use of insulin: Secondary | ICD-10-CM | POA: Diagnosis not present

## 2021-05-11 DIAGNOSIS — M6281 Muscle weakness (generalized): Secondary | ICD-10-CM | POA: Diagnosis not present

## 2021-05-11 DIAGNOSIS — M25551 Pain in right hip: Secondary | ICD-10-CM | POA: Diagnosis not present

## 2021-05-11 DIAGNOSIS — Z9181 History of falling: Secondary | ICD-10-CM | POA: Diagnosis not present

## 2021-05-13 ENCOUNTER — Telehealth: Payer: Self-pay

## 2021-05-13 DIAGNOSIS — M6281 Muscle weakness (generalized): Secondary | ICD-10-CM | POA: Diagnosis not present

## 2021-05-13 DIAGNOSIS — Z9181 History of falling: Secondary | ICD-10-CM | POA: Diagnosis not present

## 2021-05-13 DIAGNOSIS — M25551 Pain in right hip: Secondary | ICD-10-CM | POA: Diagnosis not present

## 2021-05-13 NOTE — Telephone Encounter (Signed)
Merleen Nicely from PT called asking patients WB status  She said she is exclusively hoyer lift but states it would be easier if they can get her to stand for the bathroom if she is able? Cb# [phone]

## 2021-05-14 NOTE — Telephone Encounter (Signed)
Can weight bear as tolerated bilateral LEs

## 2021-05-14 NOTE — Telephone Encounter (Signed)
I called and sw PT to advise of message below. Will call with any other questions.

## 2021-05-14 NOTE — Telephone Encounter (Signed)
This pt was last seen by Dr. Sharol Given in June for bilateral shoulder dislocations. Can you please see message below and advise?

## 2021-05-17 DIAGNOSIS — Z992 Dependence on renal dialysis: Secondary | ICD-10-CM | POA: Diagnosis not present

## 2021-05-17 DIAGNOSIS — N186 End stage renal disease: Secondary | ICD-10-CM | POA: Diagnosis not present

## 2021-05-17 DIAGNOSIS — N2581 Secondary hyperparathyroidism of renal origin: Secondary | ICD-10-CM | POA: Diagnosis not present

## 2021-05-18 DIAGNOSIS — M25551 Pain in right hip: Secondary | ICD-10-CM | POA: Diagnosis not present

## 2021-05-18 DIAGNOSIS — M21372 Foot drop, left foot: Secondary | ICD-10-CM | POA: Diagnosis not present

## 2021-05-18 DIAGNOSIS — M24672 Ankylosis, left ankle: Secondary | ICD-10-CM | POA: Diagnosis not present

## 2021-05-18 DIAGNOSIS — E1142 Type 2 diabetes mellitus with diabetic polyneuropathy: Secondary | ICD-10-CM | POA: Diagnosis not present

## 2021-05-18 DIAGNOSIS — M6281 Muscle weakness (generalized): Secondary | ICD-10-CM | POA: Diagnosis not present

## 2021-05-18 DIAGNOSIS — Z9181 History of falling: Secondary | ICD-10-CM | POA: Diagnosis not present

## 2021-05-20 DIAGNOSIS — M25551 Pain in right hip: Secondary | ICD-10-CM | POA: Diagnosis not present

## 2021-05-20 DIAGNOSIS — M6281 Muscle weakness (generalized): Secondary | ICD-10-CM | POA: Diagnosis not present

## 2021-05-20 DIAGNOSIS — Z9181 History of falling: Secondary | ICD-10-CM | POA: Diagnosis not present

## 2021-05-24 DIAGNOSIS — Z992 Dependence on renal dialysis: Secondary | ICD-10-CM | POA: Diagnosis not present

## 2021-05-24 DIAGNOSIS — N2581 Secondary hyperparathyroidism of renal origin: Secondary | ICD-10-CM | POA: Diagnosis not present

## 2021-05-24 DIAGNOSIS — D631 Anemia in chronic kidney disease: Secondary | ICD-10-CM | POA: Diagnosis not present

## 2021-05-24 DIAGNOSIS — N186 End stage renal disease: Secondary | ICD-10-CM | POA: Diagnosis not present

## 2021-05-24 DIAGNOSIS — Z23 Encounter for immunization: Secondary | ICD-10-CM | POA: Diagnosis not present

## 2021-05-24 DIAGNOSIS — E876 Hypokalemia: Secondary | ICD-10-CM | POA: Diagnosis not present

## 2021-05-25 DIAGNOSIS — M6281 Muscle weakness (generalized): Secondary | ICD-10-CM | POA: Diagnosis not present

## 2021-05-25 DIAGNOSIS — Z9181 History of falling: Secondary | ICD-10-CM | POA: Diagnosis not present

## 2021-05-25 DIAGNOSIS — M25551 Pain in right hip: Secondary | ICD-10-CM | POA: Diagnosis not present

## 2021-05-27 DIAGNOSIS — Z9181 History of falling: Secondary | ICD-10-CM | POA: Diagnosis not present

## 2021-05-27 DIAGNOSIS — M19072 Primary osteoarthritis, left ankle and foot: Secondary | ICD-10-CM | POA: Diagnosis not present

## 2021-05-27 DIAGNOSIS — Z993 Dependence on wheelchair: Secondary | ICD-10-CM | POA: Diagnosis not present

## 2021-05-27 DIAGNOSIS — M6281 Muscle weakness (generalized): Secondary | ICD-10-CM | POA: Diagnosis not present

## 2021-05-27 DIAGNOSIS — S82422D Displaced transverse fracture of shaft of left fibula, subsequent encounter for closed fracture with routine healing: Secondary | ICD-10-CM | POA: Diagnosis not present

## 2021-05-31 DIAGNOSIS — N2581 Secondary hyperparathyroidism of renal origin: Secondary | ICD-10-CM | POA: Diagnosis not present

## 2021-05-31 DIAGNOSIS — Z992 Dependence on renal dialysis: Secondary | ICD-10-CM | POA: Diagnosis not present

## 2021-05-31 DIAGNOSIS — E876 Hypokalemia: Secondary | ICD-10-CM | POA: Diagnosis not present

## 2021-05-31 DIAGNOSIS — N186 End stage renal disease: Secondary | ICD-10-CM | POA: Diagnosis not present

## 2021-06-01 DIAGNOSIS — Z9181 History of falling: Secondary | ICD-10-CM | POA: Diagnosis not present

## 2021-06-01 DIAGNOSIS — M6281 Muscle weakness (generalized): Secondary | ICD-10-CM | POA: Diagnosis not present

## 2021-06-01 DIAGNOSIS — M25551 Pain in right hip: Secondary | ICD-10-CM | POA: Diagnosis not present

## 2021-06-07 DIAGNOSIS — N2581 Secondary hyperparathyroidism of renal origin: Secondary | ICD-10-CM | POA: Diagnosis not present

## 2021-06-07 DIAGNOSIS — Z992 Dependence on renal dialysis: Secondary | ICD-10-CM | POA: Diagnosis not present

## 2021-06-07 DIAGNOSIS — E876 Hypokalemia: Secondary | ICD-10-CM | POA: Diagnosis not present

## 2021-06-07 DIAGNOSIS — N186 End stage renal disease: Secondary | ICD-10-CM | POA: Diagnosis not present

## 2021-06-09 DIAGNOSIS — M25551 Pain in right hip: Secondary | ICD-10-CM | POA: Diagnosis not present

## 2021-06-09 DIAGNOSIS — Z9181 History of falling: Secondary | ICD-10-CM | POA: Diagnosis not present

## 2021-06-09 DIAGNOSIS — E1022 Type 1 diabetes mellitus with diabetic chronic kidney disease: Secondary | ICD-10-CM | POA: Diagnosis not present

## 2021-06-09 DIAGNOSIS — N186 End stage renal disease: Secondary | ICD-10-CM | POA: Diagnosis not present

## 2021-06-09 DIAGNOSIS — M6281 Muscle weakness (generalized): Secondary | ICD-10-CM | POA: Diagnosis not present

## 2021-06-09 DIAGNOSIS — Z992 Dependence on renal dialysis: Secondary | ICD-10-CM | POA: Diagnosis not present

## 2021-06-10 DIAGNOSIS — M25551 Pain in right hip: Secondary | ICD-10-CM | POA: Diagnosis not present

## 2021-06-10 DIAGNOSIS — Z9181 History of falling: Secondary | ICD-10-CM | POA: Diagnosis not present

## 2021-06-10 DIAGNOSIS — Z794 Long term (current) use of insulin: Secondary | ICD-10-CM | POA: Diagnosis not present

## 2021-06-10 DIAGNOSIS — E119 Type 2 diabetes mellitus without complications: Secondary | ICD-10-CM | POA: Diagnosis not present

## 2021-06-10 DIAGNOSIS — M6281 Muscle weakness (generalized): Secondary | ICD-10-CM | POA: Diagnosis not present

## 2021-06-11 DIAGNOSIS — N2581 Secondary hyperparathyroidism of renal origin: Secondary | ICD-10-CM | POA: Diagnosis not present

## 2021-06-11 DIAGNOSIS — N186 End stage renal disease: Secondary | ICD-10-CM | POA: Diagnosis not present

## 2021-06-11 DIAGNOSIS — Z992 Dependence on renal dialysis: Secondary | ICD-10-CM | POA: Diagnosis not present

## 2021-06-14 DIAGNOSIS — N2581 Secondary hyperparathyroidism of renal origin: Secondary | ICD-10-CM | POA: Diagnosis not present

## 2021-06-14 DIAGNOSIS — Z992 Dependence on renal dialysis: Secondary | ICD-10-CM | POA: Diagnosis not present

## 2021-06-14 DIAGNOSIS — N186 End stage renal disease: Secondary | ICD-10-CM | POA: Diagnosis not present

## 2021-06-14 DIAGNOSIS — E876 Hypokalemia: Secondary | ICD-10-CM | POA: Diagnosis not present

## 2021-06-15 DIAGNOSIS — M25551 Pain in right hip: Secondary | ICD-10-CM | POA: Diagnosis not present

## 2021-06-15 DIAGNOSIS — M6281 Muscle weakness (generalized): Secondary | ICD-10-CM | POA: Diagnosis not present

## 2021-06-15 DIAGNOSIS — Z9181 History of falling: Secondary | ICD-10-CM | POA: Diagnosis not present

## 2021-06-21 DIAGNOSIS — D631 Anemia in chronic kidney disease: Secondary | ICD-10-CM | POA: Diagnosis not present

## 2021-06-21 DIAGNOSIS — N2581 Secondary hyperparathyroidism of renal origin: Secondary | ICD-10-CM | POA: Diagnosis not present

## 2021-06-21 DIAGNOSIS — N186 End stage renal disease: Secondary | ICD-10-CM | POA: Diagnosis not present

## 2021-06-21 DIAGNOSIS — Z992 Dependence on renal dialysis: Secondary | ICD-10-CM | POA: Diagnosis not present

## 2021-06-22 DIAGNOSIS — Z9181 History of falling: Secondary | ICD-10-CM | POA: Diagnosis not present

## 2021-06-22 DIAGNOSIS — E0840 Diabetes mellitus due to underlying condition with diabetic neuropathy, unspecified: Secondary | ICD-10-CM | POA: Diagnosis not present

## 2021-06-22 DIAGNOSIS — M25551 Pain in right hip: Secondary | ICD-10-CM | POA: Diagnosis not present

## 2021-06-22 DIAGNOSIS — M6281 Muscle weakness (generalized): Secondary | ICD-10-CM | POA: Diagnosis not present

## 2021-06-22 DIAGNOSIS — N186 End stage renal disease: Secondary | ICD-10-CM | POA: Diagnosis not present

## 2021-06-22 DIAGNOSIS — I10 Essential (primary) hypertension: Secondary | ICD-10-CM | POA: Diagnosis not present

## 2021-06-22 DIAGNOSIS — E7800 Pure hypercholesterolemia, unspecified: Secondary | ICD-10-CM | POA: Diagnosis not present

## 2021-06-22 DIAGNOSIS — E1165 Type 2 diabetes mellitus with hyperglycemia: Secondary | ICD-10-CM | POA: Diagnosis not present

## 2021-06-24 DIAGNOSIS — Z01818 Encounter for other preprocedural examination: Secondary | ICD-10-CM | POA: Diagnosis not present

## 2021-06-24 DIAGNOSIS — M6281 Muscle weakness (generalized): Secondary | ICD-10-CM | POA: Diagnosis not present

## 2021-06-24 DIAGNOSIS — Z9181 History of falling: Secondary | ICD-10-CM | POA: Diagnosis not present

## 2021-06-24 DIAGNOSIS — H25812 Combined forms of age-related cataract, left eye: Secondary | ICD-10-CM | POA: Diagnosis not present

## 2021-06-24 DIAGNOSIS — M25551 Pain in right hip: Secondary | ICD-10-CM | POA: Diagnosis not present

## 2021-06-24 DIAGNOSIS — Z961 Presence of intraocular lens: Secondary | ICD-10-CM | POA: Diagnosis not present

## 2021-06-24 DIAGNOSIS — E113293 Type 2 diabetes mellitus with mild nonproliferative diabetic retinopathy without macular edema, bilateral: Secondary | ICD-10-CM | POA: Diagnosis not present

## 2021-06-27 DIAGNOSIS — S82422D Displaced transverse fracture of shaft of left fibula, subsequent encounter for closed fracture with routine healing: Secondary | ICD-10-CM | POA: Diagnosis not present

## 2021-06-27 DIAGNOSIS — Z993 Dependence on wheelchair: Secondary | ICD-10-CM | POA: Diagnosis not present

## 2021-06-27 DIAGNOSIS — Z9181 History of falling: Secondary | ICD-10-CM | POA: Diagnosis not present

## 2021-06-27 DIAGNOSIS — M19072 Primary osteoarthritis, left ankle and foot: Secondary | ICD-10-CM | POA: Diagnosis not present

## 2021-06-27 DIAGNOSIS — M6281 Muscle weakness (generalized): Secondary | ICD-10-CM | POA: Diagnosis not present

## 2021-06-28 DIAGNOSIS — N186 End stage renal disease: Secondary | ICD-10-CM | POA: Diagnosis not present

## 2021-06-28 DIAGNOSIS — E876 Hypokalemia: Secondary | ICD-10-CM | POA: Diagnosis not present

## 2021-06-28 DIAGNOSIS — N2581 Secondary hyperparathyroidism of renal origin: Secondary | ICD-10-CM | POA: Diagnosis not present

## 2021-06-28 DIAGNOSIS — Z992 Dependence on renal dialysis: Secondary | ICD-10-CM | POA: Diagnosis not present

## 2021-06-29 DIAGNOSIS — Z9181 History of falling: Secondary | ICD-10-CM | POA: Diagnosis not present

## 2021-06-29 DIAGNOSIS — M6281 Muscle weakness (generalized): Secondary | ICD-10-CM | POA: Diagnosis not present

## 2021-06-29 DIAGNOSIS — M25551 Pain in right hip: Secondary | ICD-10-CM | POA: Diagnosis not present

## 2021-07-01 DIAGNOSIS — M25551 Pain in right hip: Secondary | ICD-10-CM | POA: Diagnosis not present

## 2021-07-01 DIAGNOSIS — M6281 Muscle weakness (generalized): Secondary | ICD-10-CM | POA: Diagnosis not present

## 2021-07-01 DIAGNOSIS — Z9181 History of falling: Secondary | ICD-10-CM | POA: Diagnosis not present

## 2021-07-05 DIAGNOSIS — D509 Iron deficiency anemia, unspecified: Secondary | ICD-10-CM | POA: Diagnosis not present

## 2021-07-05 DIAGNOSIS — E876 Hypokalemia: Secondary | ICD-10-CM | POA: Diagnosis not present

## 2021-07-05 DIAGNOSIS — Z992 Dependence on renal dialysis: Secondary | ICD-10-CM | POA: Diagnosis not present

## 2021-07-05 DIAGNOSIS — N2581 Secondary hyperparathyroidism of renal origin: Secondary | ICD-10-CM | POA: Diagnosis not present

## 2021-07-05 DIAGNOSIS — N186 End stage renal disease: Secondary | ICD-10-CM | POA: Diagnosis not present

## 2021-07-06 DIAGNOSIS — M6281 Muscle weakness (generalized): Secondary | ICD-10-CM | POA: Diagnosis not present

## 2021-07-06 DIAGNOSIS — R52 Pain, unspecified: Secondary | ICD-10-CM | POA: Diagnosis not present

## 2021-07-06 DIAGNOSIS — Z9181 History of falling: Secondary | ICD-10-CM | POA: Diagnosis not present

## 2021-07-06 DIAGNOSIS — M25551 Pain in right hip: Secondary | ICD-10-CM | POA: Diagnosis not present

## 2021-07-08 DIAGNOSIS — M6281 Muscle weakness (generalized): Secondary | ICD-10-CM | POA: Diagnosis not present

## 2021-07-08 DIAGNOSIS — Z9181 History of falling: Secondary | ICD-10-CM | POA: Diagnosis not present

## 2021-07-08 DIAGNOSIS — M25551 Pain in right hip: Secondary | ICD-10-CM | POA: Diagnosis not present

## 2021-07-09 DIAGNOSIS — N186 End stage renal disease: Secondary | ICD-10-CM | POA: Diagnosis not present

## 2021-07-09 DIAGNOSIS — E1022 Type 1 diabetes mellitus with diabetic chronic kidney disease: Secondary | ICD-10-CM | POA: Diagnosis not present

## 2021-07-09 DIAGNOSIS — Z992 Dependence on renal dialysis: Secondary | ICD-10-CM | POA: Diagnosis not present

## 2021-07-12 DIAGNOSIS — Z992 Dependence on renal dialysis: Secondary | ICD-10-CM | POA: Diagnosis not present

## 2021-07-12 DIAGNOSIS — D509 Iron deficiency anemia, unspecified: Secondary | ICD-10-CM | POA: Diagnosis not present

## 2021-07-12 DIAGNOSIS — Z23 Encounter for immunization: Secondary | ICD-10-CM | POA: Diagnosis not present

## 2021-07-12 DIAGNOSIS — N2581 Secondary hyperparathyroidism of renal origin: Secondary | ICD-10-CM | POA: Diagnosis not present

## 2021-07-12 DIAGNOSIS — E119 Type 2 diabetes mellitus without complications: Secondary | ICD-10-CM | POA: Diagnosis not present

## 2021-07-12 DIAGNOSIS — E876 Hypokalemia: Secondary | ICD-10-CM | POA: Diagnosis not present

## 2021-07-12 DIAGNOSIS — N186 End stage renal disease: Secondary | ICD-10-CM | POA: Diagnosis not present

## 2021-07-12 DIAGNOSIS — Z794 Long term (current) use of insulin: Secondary | ICD-10-CM | POA: Diagnosis not present

## 2021-07-13 DIAGNOSIS — M6281 Muscle weakness (generalized): Secondary | ICD-10-CM | POA: Diagnosis not present

## 2021-07-13 DIAGNOSIS — M25551 Pain in right hip: Secondary | ICD-10-CM | POA: Diagnosis not present

## 2021-07-13 DIAGNOSIS — Z9181 History of falling: Secondary | ICD-10-CM | POA: Diagnosis not present

## 2021-07-15 DIAGNOSIS — F03918 Unspecified dementia, unspecified severity, with other behavioral disturbance: Secondary | ICD-10-CM | POA: Diagnosis not present

## 2021-07-15 DIAGNOSIS — H2512 Age-related nuclear cataract, left eye: Secondary | ICD-10-CM | POA: Diagnosis not present

## 2021-07-15 DIAGNOSIS — Z7401 Bed confinement status: Secondary | ICD-10-CM | POA: Diagnosis not present

## 2021-07-15 DIAGNOSIS — H25812 Combined forms of age-related cataract, left eye: Secondary | ICD-10-CM | POA: Diagnosis not present

## 2021-07-15 DIAGNOSIS — R5381 Other malaise: Secondary | ICD-10-CM | POA: Diagnosis not present

## 2021-07-15 DIAGNOSIS — M2550 Pain in unspecified joint: Secondary | ICD-10-CM | POA: Diagnosis not present

## 2021-07-15 DIAGNOSIS — R531 Weakness: Secondary | ICD-10-CM | POA: Diagnosis not present

## 2021-07-15 DIAGNOSIS — F5102 Adjustment insomnia: Secondary | ICD-10-CM | POA: Diagnosis not present

## 2021-07-19 DIAGNOSIS — Z992 Dependence on renal dialysis: Secondary | ICD-10-CM | POA: Diagnosis not present

## 2021-07-19 DIAGNOSIS — N186 End stage renal disease: Secondary | ICD-10-CM | POA: Diagnosis not present

## 2021-07-19 DIAGNOSIS — N2581 Secondary hyperparathyroidism of renal origin: Secondary | ICD-10-CM | POA: Diagnosis not present

## 2021-07-19 DIAGNOSIS — D509 Iron deficiency anemia, unspecified: Secondary | ICD-10-CM | POA: Diagnosis not present

## 2021-07-20 DIAGNOSIS — M6281 Muscle weakness (generalized): Secondary | ICD-10-CM | POA: Diagnosis not present

## 2021-07-20 DIAGNOSIS — Z9181 History of falling: Secondary | ICD-10-CM | POA: Diagnosis not present

## 2021-07-20 DIAGNOSIS — M25551 Pain in right hip: Secondary | ICD-10-CM | POA: Diagnosis not present

## 2021-07-22 DIAGNOSIS — M25551 Pain in right hip: Secondary | ICD-10-CM | POA: Diagnosis not present

## 2021-07-22 DIAGNOSIS — M6281 Muscle weakness (generalized): Secondary | ICD-10-CM | POA: Diagnosis not present

## 2021-07-22 DIAGNOSIS — Z9181 History of falling: Secondary | ICD-10-CM | POA: Diagnosis not present

## 2021-07-26 DIAGNOSIS — N2581 Secondary hyperparathyroidism of renal origin: Secondary | ICD-10-CM | POA: Diagnosis not present

## 2021-07-26 DIAGNOSIS — Z992 Dependence on renal dialysis: Secondary | ICD-10-CM | POA: Diagnosis not present

## 2021-07-26 DIAGNOSIS — D509 Iron deficiency anemia, unspecified: Secondary | ICD-10-CM | POA: Diagnosis not present

## 2021-07-26 DIAGNOSIS — E876 Hypokalemia: Secondary | ICD-10-CM | POA: Diagnosis not present

## 2021-07-26 DIAGNOSIS — N186 End stage renal disease: Secondary | ICD-10-CM | POA: Diagnosis not present

## 2021-07-27 DIAGNOSIS — M19072 Primary osteoarthritis, left ankle and foot: Secondary | ICD-10-CM | POA: Diagnosis not present

## 2021-07-27 DIAGNOSIS — Z9181 History of falling: Secondary | ICD-10-CM | POA: Diagnosis not present

## 2021-07-27 DIAGNOSIS — Z993 Dependence on wheelchair: Secondary | ICD-10-CM | POA: Diagnosis not present

## 2021-07-27 DIAGNOSIS — S82422D Displaced transverse fracture of shaft of left fibula, subsequent encounter for closed fracture with routine healing: Secondary | ICD-10-CM | POA: Diagnosis not present

## 2021-07-27 DIAGNOSIS — M6281 Muscle weakness (generalized): Secondary | ICD-10-CM | POA: Diagnosis not present

## 2021-07-29 DIAGNOSIS — Z9181 History of falling: Secondary | ICD-10-CM | POA: Diagnosis not present

## 2021-07-29 DIAGNOSIS — M6281 Muscle weakness (generalized): Secondary | ICD-10-CM | POA: Diagnosis not present

## 2021-07-29 DIAGNOSIS — M25551 Pain in right hip: Secondary | ICD-10-CM | POA: Diagnosis not present

## 2021-08-02 DIAGNOSIS — N2581 Secondary hyperparathyroidism of renal origin: Secondary | ICD-10-CM | POA: Diagnosis not present

## 2021-08-02 DIAGNOSIS — Z992 Dependence on renal dialysis: Secondary | ICD-10-CM | POA: Diagnosis not present

## 2021-08-02 DIAGNOSIS — N186 End stage renal disease: Secondary | ICD-10-CM | POA: Diagnosis not present

## 2021-08-03 DIAGNOSIS — Z9181 History of falling: Secondary | ICD-10-CM | POA: Diagnosis not present

## 2021-08-03 DIAGNOSIS — M6281 Muscle weakness (generalized): Secondary | ICD-10-CM | POA: Diagnosis not present

## 2021-08-03 DIAGNOSIS — M25551 Pain in right hip: Secondary | ICD-10-CM | POA: Diagnosis not present

## 2021-08-09 DIAGNOSIS — E1022 Type 1 diabetes mellitus with diabetic chronic kidney disease: Secondary | ICD-10-CM | POA: Diagnosis not present

## 2021-08-09 DIAGNOSIS — E876 Hypokalemia: Secondary | ICD-10-CM | POA: Diagnosis not present

## 2021-08-09 DIAGNOSIS — N186 End stage renal disease: Secondary | ICD-10-CM | POA: Diagnosis not present

## 2021-08-09 DIAGNOSIS — N2581 Secondary hyperparathyroidism of renal origin: Secondary | ICD-10-CM | POA: Diagnosis not present

## 2021-08-09 DIAGNOSIS — D631 Anemia in chronic kidney disease: Secondary | ICD-10-CM | POA: Diagnosis not present

## 2021-08-09 DIAGNOSIS — Z992 Dependence on renal dialysis: Secondary | ICD-10-CM | POA: Diagnosis not present

## 2021-08-10 DIAGNOSIS — Z794 Long term (current) use of insulin: Secondary | ICD-10-CM | POA: Diagnosis not present

## 2021-08-10 DIAGNOSIS — Z9181 History of falling: Secondary | ICD-10-CM | POA: Diagnosis not present

## 2021-08-10 DIAGNOSIS — M25551 Pain in right hip: Secondary | ICD-10-CM | POA: Diagnosis not present

## 2021-08-10 DIAGNOSIS — E119 Type 2 diabetes mellitus without complications: Secondary | ICD-10-CM | POA: Diagnosis not present

## 2021-08-10 DIAGNOSIS — M6281 Muscle weakness (generalized): Secondary | ICD-10-CM | POA: Diagnosis not present

## 2021-08-11 DIAGNOSIS — E876 Hypokalemia: Secondary | ICD-10-CM | POA: Diagnosis not present

## 2021-08-11 DIAGNOSIS — Z992 Dependence on renal dialysis: Secondary | ICD-10-CM | POA: Diagnosis not present

## 2021-08-11 DIAGNOSIS — N2581 Secondary hyperparathyroidism of renal origin: Secondary | ICD-10-CM | POA: Diagnosis not present

## 2021-08-11 DIAGNOSIS — N186 End stage renal disease: Secondary | ICD-10-CM | POA: Diagnosis not present

## 2021-08-11 DIAGNOSIS — D509 Iron deficiency anemia, unspecified: Secondary | ICD-10-CM | POA: Diagnosis not present

## 2021-08-16 DIAGNOSIS — N186 End stage renal disease: Secondary | ICD-10-CM | POA: Diagnosis not present

## 2021-08-16 DIAGNOSIS — Z992 Dependence on renal dialysis: Secondary | ICD-10-CM | POA: Diagnosis not present

## 2021-08-16 DIAGNOSIS — D509 Iron deficiency anemia, unspecified: Secondary | ICD-10-CM | POA: Diagnosis not present

## 2021-08-16 DIAGNOSIS — D631 Anemia in chronic kidney disease: Secondary | ICD-10-CM | POA: Diagnosis not present

## 2021-08-16 DIAGNOSIS — N2581 Secondary hyperparathyroidism of renal origin: Secondary | ICD-10-CM | POA: Diagnosis not present

## 2021-08-17 DIAGNOSIS — M6281 Muscle weakness (generalized): Secondary | ICD-10-CM | POA: Diagnosis not present

## 2021-08-17 DIAGNOSIS — Z9181 History of falling: Secondary | ICD-10-CM | POA: Diagnosis not present

## 2021-08-17 DIAGNOSIS — F331 Major depressive disorder, recurrent, moderate: Secondary | ICD-10-CM | POA: Diagnosis not present

## 2021-08-17 DIAGNOSIS — M25551 Pain in right hip: Secondary | ICD-10-CM | POA: Diagnosis not present

## 2021-08-19 DIAGNOSIS — M6281 Muscle weakness (generalized): Secondary | ICD-10-CM | POA: Diagnosis not present

## 2021-08-19 DIAGNOSIS — M25551 Pain in right hip: Secondary | ICD-10-CM | POA: Diagnosis not present

## 2021-08-19 DIAGNOSIS — Z9181 History of falling: Secondary | ICD-10-CM | POA: Diagnosis not present

## 2021-08-23 DIAGNOSIS — Z992 Dependence on renal dialysis: Secondary | ICD-10-CM | POA: Diagnosis not present

## 2021-08-23 DIAGNOSIS — D509 Iron deficiency anemia, unspecified: Secondary | ICD-10-CM | POA: Diagnosis not present

## 2021-08-23 DIAGNOSIS — N186 End stage renal disease: Secondary | ICD-10-CM | POA: Diagnosis not present

## 2021-08-23 DIAGNOSIS — N2581 Secondary hyperparathyroidism of renal origin: Secondary | ICD-10-CM | POA: Diagnosis not present

## 2021-08-24 DIAGNOSIS — Z9181 History of falling: Secondary | ICD-10-CM | POA: Diagnosis not present

## 2021-08-24 DIAGNOSIS — M25551 Pain in right hip: Secondary | ICD-10-CM | POA: Diagnosis not present

## 2021-08-24 DIAGNOSIS — M6281 Muscle weakness (generalized): Secondary | ICD-10-CM | POA: Diagnosis not present

## 2021-08-26 DIAGNOSIS — M6281 Muscle weakness (generalized): Secondary | ICD-10-CM | POA: Diagnosis not present

## 2021-08-26 DIAGNOSIS — M25551 Pain in right hip: Secondary | ICD-10-CM | POA: Diagnosis not present

## 2021-08-26 DIAGNOSIS — Z9181 History of falling: Secondary | ICD-10-CM | POA: Diagnosis not present

## 2021-08-27 DIAGNOSIS — M6281 Muscle weakness (generalized): Secondary | ICD-10-CM | POA: Diagnosis not present

## 2021-08-27 DIAGNOSIS — S82422D Displaced transverse fracture of shaft of left fibula, subsequent encounter for closed fracture with routine healing: Secondary | ICD-10-CM | POA: Diagnosis not present

## 2021-08-27 DIAGNOSIS — Z993 Dependence on wheelchair: Secondary | ICD-10-CM | POA: Diagnosis not present

## 2021-08-27 DIAGNOSIS — M19072 Primary osteoarthritis, left ankle and foot: Secondary | ICD-10-CM | POA: Diagnosis not present

## 2021-08-27 DIAGNOSIS — Z9181 History of falling: Secondary | ICD-10-CM | POA: Diagnosis not present

## 2021-08-30 DIAGNOSIS — Z992 Dependence on renal dialysis: Secondary | ICD-10-CM | POA: Diagnosis not present

## 2021-08-30 DIAGNOSIS — N186 End stage renal disease: Secondary | ICD-10-CM | POA: Diagnosis not present

## 2021-08-30 DIAGNOSIS — N2581 Secondary hyperparathyroidism of renal origin: Secondary | ICD-10-CM | POA: Diagnosis not present

## 2021-08-30 DIAGNOSIS — E876 Hypokalemia: Secondary | ICD-10-CM | POA: Diagnosis not present

## 2021-08-30 DIAGNOSIS — D509 Iron deficiency anemia, unspecified: Secondary | ICD-10-CM | POA: Diagnosis not present

## 2021-08-31 DIAGNOSIS — Z992 Dependence on renal dialysis: Secondary | ICD-10-CM | POA: Diagnosis not present

## 2021-08-31 DIAGNOSIS — N186 End stage renal disease: Secondary | ICD-10-CM | POA: Diagnosis not present

## 2021-08-31 DIAGNOSIS — G20 Parkinson's disease: Secondary | ICD-10-CM | POA: Diagnosis not present

## 2021-08-31 DIAGNOSIS — Z9181 History of falling: Secondary | ICD-10-CM | POA: Diagnosis not present

## 2021-08-31 DIAGNOSIS — K5901 Slow transit constipation: Secondary | ICD-10-CM | POA: Diagnosis not present

## 2021-08-31 DIAGNOSIS — M6281 Muscle weakness (generalized): Secondary | ICD-10-CM | POA: Diagnosis not present

## 2021-08-31 DIAGNOSIS — M1039 Gout due to renal impairment, multiple sites: Secondary | ICD-10-CM | POA: Diagnosis not present

## 2021-08-31 DIAGNOSIS — M25551 Pain in right hip: Secondary | ICD-10-CM | POA: Diagnosis not present

## 2021-09-03 DIAGNOSIS — M25551 Pain in right hip: Secondary | ICD-10-CM | POA: Diagnosis not present

## 2021-09-03 DIAGNOSIS — Z9181 History of falling: Secondary | ICD-10-CM | POA: Diagnosis not present

## 2021-09-03 DIAGNOSIS — M6281 Muscle weakness (generalized): Secondary | ICD-10-CM | POA: Diagnosis not present

## 2021-09-07 DIAGNOSIS — M25572 Pain in left ankle and joints of left foot: Secondary | ICD-10-CM | POA: Diagnosis not present

## 2021-09-07 DIAGNOSIS — Z9181 History of falling: Secondary | ICD-10-CM | POA: Diagnosis not present

## 2021-09-07 DIAGNOSIS — M6281 Muscle weakness (generalized): Secondary | ICD-10-CM | POA: Diagnosis not present

## 2021-09-07 DIAGNOSIS — M25551 Pain in right hip: Secondary | ICD-10-CM | POA: Diagnosis not present

## 2021-09-08 DIAGNOSIS — B351 Tinea unguium: Secondary | ICD-10-CM | POA: Diagnosis not present

## 2021-09-08 DIAGNOSIS — N2581 Secondary hyperparathyroidism of renal origin: Secondary | ICD-10-CM | POA: Diagnosis not present

## 2021-09-08 DIAGNOSIS — D509 Iron deficiency anemia, unspecified: Secondary | ICD-10-CM | POA: Diagnosis not present

## 2021-09-08 DIAGNOSIS — E1143 Type 2 diabetes mellitus with diabetic autonomic (poly)neuropathy: Secondary | ICD-10-CM | POA: Diagnosis not present

## 2021-09-08 DIAGNOSIS — N186 End stage renal disease: Secondary | ICD-10-CM | POA: Diagnosis not present

## 2021-09-08 DIAGNOSIS — E1022 Type 1 diabetes mellitus with diabetic chronic kidney disease: Secondary | ICD-10-CM | POA: Diagnosis not present

## 2021-09-08 DIAGNOSIS — Z992 Dependence on renal dialysis: Secondary | ICD-10-CM | POA: Diagnosis not present

## 2021-09-08 DIAGNOSIS — E876 Hypokalemia: Secondary | ICD-10-CM | POA: Diagnosis not present

## 2021-09-09 DIAGNOSIS — Z9181 History of falling: Secondary | ICD-10-CM | POA: Diagnosis not present

## 2021-09-09 DIAGNOSIS — M6281 Muscle weakness (generalized): Secondary | ICD-10-CM | POA: Diagnosis not present

## 2021-09-09 DIAGNOSIS — M25551 Pain in right hip: Secondary | ICD-10-CM | POA: Diagnosis not present

## 2021-09-10 DIAGNOSIS — N2581 Secondary hyperparathyroidism of renal origin: Secondary | ICD-10-CM | POA: Diagnosis not present

## 2021-09-10 DIAGNOSIS — N186 End stage renal disease: Secondary | ICD-10-CM | POA: Diagnosis not present

## 2021-09-10 DIAGNOSIS — Z992 Dependence on renal dialysis: Secondary | ICD-10-CM | POA: Diagnosis not present

## 2021-09-13 DIAGNOSIS — N2581 Secondary hyperparathyroidism of renal origin: Secondary | ICD-10-CM | POA: Diagnosis not present

## 2021-09-13 DIAGNOSIS — N186 End stage renal disease: Secondary | ICD-10-CM | POA: Diagnosis not present

## 2021-09-13 DIAGNOSIS — Z992 Dependence on renal dialysis: Secondary | ICD-10-CM | POA: Diagnosis not present

## 2021-09-13 DIAGNOSIS — E876 Hypokalemia: Secondary | ICD-10-CM | POA: Diagnosis not present

## 2021-09-13 DIAGNOSIS — D509 Iron deficiency anemia, unspecified: Secondary | ICD-10-CM | POA: Diagnosis not present

## 2021-09-14 DIAGNOSIS — M25551 Pain in right hip: Secondary | ICD-10-CM | POA: Diagnosis not present

## 2021-09-14 DIAGNOSIS — Z9181 History of falling: Secondary | ICD-10-CM | POA: Diagnosis not present

## 2021-09-14 DIAGNOSIS — M6281 Muscle weakness (generalized): Secondary | ICD-10-CM | POA: Diagnosis not present

## 2021-09-16 DIAGNOSIS — Z9181 History of falling: Secondary | ICD-10-CM | POA: Diagnosis not present

## 2021-09-16 DIAGNOSIS — F331 Major depressive disorder, recurrent, moderate: Secondary | ICD-10-CM | POA: Diagnosis not present

## 2021-09-16 DIAGNOSIS — M25551 Pain in right hip: Secondary | ICD-10-CM | POA: Diagnosis not present

## 2021-09-16 DIAGNOSIS — M6281 Muscle weakness (generalized): Secondary | ICD-10-CM | POA: Diagnosis not present

## 2021-09-20 DIAGNOSIS — N186 End stage renal disease: Secondary | ICD-10-CM | POA: Diagnosis not present

## 2021-09-20 DIAGNOSIS — Z992 Dependence on renal dialysis: Secondary | ICD-10-CM | POA: Diagnosis not present

## 2021-09-20 DIAGNOSIS — D509 Iron deficiency anemia, unspecified: Secondary | ICD-10-CM | POA: Diagnosis not present

## 2021-09-20 DIAGNOSIS — N2581 Secondary hyperparathyroidism of renal origin: Secondary | ICD-10-CM | POA: Diagnosis not present

## 2021-09-20 DIAGNOSIS — D631 Anemia in chronic kidney disease: Secondary | ICD-10-CM | POA: Diagnosis not present

## 2021-09-21 DIAGNOSIS — M6281 Muscle weakness (generalized): Secondary | ICD-10-CM | POA: Diagnosis not present

## 2021-09-21 DIAGNOSIS — Z9181 History of falling: Secondary | ICD-10-CM | POA: Diagnosis not present

## 2021-09-21 DIAGNOSIS — M25551 Pain in right hip: Secondary | ICD-10-CM | POA: Diagnosis not present

## 2021-09-23 DIAGNOSIS — M6281 Muscle weakness (generalized): Secondary | ICD-10-CM | POA: Diagnosis not present

## 2021-09-23 DIAGNOSIS — M25551 Pain in right hip: Secondary | ICD-10-CM | POA: Diagnosis not present

## 2021-09-23 DIAGNOSIS — Z9181 History of falling: Secondary | ICD-10-CM | POA: Diagnosis not present

## 2021-09-26 DIAGNOSIS — M19072 Primary osteoarthritis, left ankle and foot: Secondary | ICD-10-CM | POA: Diagnosis not present

## 2021-09-26 DIAGNOSIS — Z9181 History of falling: Secondary | ICD-10-CM | POA: Diagnosis not present

## 2021-09-26 DIAGNOSIS — Z993 Dependence on wheelchair: Secondary | ICD-10-CM | POA: Diagnosis not present

## 2021-09-26 DIAGNOSIS — M6281 Muscle weakness (generalized): Secondary | ICD-10-CM | POA: Diagnosis not present

## 2021-09-26 DIAGNOSIS — S82422D Displaced transverse fracture of shaft of left fibula, subsequent encounter for closed fracture with routine healing: Secondary | ICD-10-CM | POA: Diagnosis not present

## 2021-09-28 DIAGNOSIS — Z9181 History of falling: Secondary | ICD-10-CM | POA: Diagnosis not present

## 2021-09-28 DIAGNOSIS — M6281 Muscle weakness (generalized): Secondary | ICD-10-CM | POA: Diagnosis not present

## 2021-09-28 DIAGNOSIS — M25551 Pain in right hip: Secondary | ICD-10-CM | POA: Diagnosis not present

## 2021-09-30 DIAGNOSIS — M6281 Muscle weakness (generalized): Secondary | ICD-10-CM | POA: Diagnosis not present

## 2021-09-30 DIAGNOSIS — Z9181 History of falling: Secondary | ICD-10-CM | POA: Diagnosis not present

## 2021-09-30 DIAGNOSIS — M25551 Pain in right hip: Secondary | ICD-10-CM | POA: Diagnosis not present

## 2021-10-04 DIAGNOSIS — M6281 Muscle weakness (generalized): Secondary | ICD-10-CM | POA: Diagnosis not present

## 2021-10-04 DIAGNOSIS — Z9181 History of falling: Secondary | ICD-10-CM | POA: Diagnosis not present

## 2021-10-04 DIAGNOSIS — M25551 Pain in right hip: Secondary | ICD-10-CM | POA: Diagnosis not present

## 2021-10-05 DIAGNOSIS — Z992 Dependence on renal dialysis: Secondary | ICD-10-CM | POA: Diagnosis not present

## 2021-10-05 DIAGNOSIS — F331 Major depressive disorder, recurrent, moderate: Secondary | ICD-10-CM | POA: Diagnosis not present

## 2021-10-05 DIAGNOSIS — N186 End stage renal disease: Secondary | ICD-10-CM | POA: Diagnosis not present

## 2021-10-05 DIAGNOSIS — K219 Gastro-esophageal reflux disease without esophagitis: Secondary | ICD-10-CM | POA: Diagnosis not present

## 2021-10-05 DIAGNOSIS — L989 Disorder of the skin and subcutaneous tissue, unspecified: Secondary | ICD-10-CM | POA: Diagnosis not present

## 2021-10-07 DIAGNOSIS — Z9181 History of falling: Secondary | ICD-10-CM | POA: Diagnosis not present

## 2021-10-07 DIAGNOSIS — M6281 Muscle weakness (generalized): Secondary | ICD-10-CM | POA: Diagnosis not present

## 2021-10-07 DIAGNOSIS — M25551 Pain in right hip: Secondary | ICD-10-CM | POA: Diagnosis not present

## 2021-10-11 DIAGNOSIS — N186 End stage renal disease: Secondary | ICD-10-CM | POA: Diagnosis not present

## 2021-10-11 DIAGNOSIS — Z992 Dependence on renal dialysis: Secondary | ICD-10-CM | POA: Diagnosis not present

## 2021-10-11 DIAGNOSIS — N2581 Secondary hyperparathyroidism of renal origin: Secondary | ICD-10-CM | POA: Diagnosis not present

## 2021-10-11 DIAGNOSIS — E876 Hypokalemia: Secondary | ICD-10-CM | POA: Diagnosis not present

## 2021-10-12 DIAGNOSIS — R278 Other lack of coordination: Secondary | ICD-10-CM | POA: Diagnosis not present

## 2021-10-12 DIAGNOSIS — M542 Cervicalgia: Secondary | ICD-10-CM | POA: Diagnosis not present

## 2021-10-12 DIAGNOSIS — M25551 Pain in right hip: Secondary | ICD-10-CM | POA: Diagnosis not present

## 2021-10-12 DIAGNOSIS — Z9181 History of falling: Secondary | ICD-10-CM | POA: Diagnosis not present

## 2021-10-12 DIAGNOSIS — Z79899 Other long term (current) drug therapy: Secondary | ICD-10-CM | POA: Diagnosis not present

## 2021-10-12 DIAGNOSIS — M1A39X0 Chronic gout due to renal impairment, multiple sites, without tophus (tophi): Secondary | ICD-10-CM | POA: Diagnosis not present

## 2021-10-12 DIAGNOSIS — M6281 Muscle weakness (generalized): Secondary | ICD-10-CM | POA: Diagnosis not present

## 2021-10-12 DIAGNOSIS — Z992 Dependence on renal dialysis: Secondary | ICD-10-CM | POA: Diagnosis not present

## 2021-10-12 DIAGNOSIS — G894 Chronic pain syndrome: Secondary | ICD-10-CM | POA: Diagnosis not present

## 2021-10-14 DIAGNOSIS — R278 Other lack of coordination: Secondary | ICD-10-CM | POA: Diagnosis not present

## 2021-10-14 DIAGNOSIS — E119 Type 2 diabetes mellitus without complications: Secondary | ICD-10-CM | POA: Diagnosis not present

## 2021-10-14 DIAGNOSIS — Z9181 History of falling: Secondary | ICD-10-CM | POA: Diagnosis not present

## 2021-10-14 DIAGNOSIS — M6281 Muscle weakness (generalized): Secondary | ICD-10-CM | POA: Diagnosis not present

## 2021-10-14 DIAGNOSIS — Z794 Long term (current) use of insulin: Secondary | ICD-10-CM | POA: Diagnosis not present

## 2021-10-14 DIAGNOSIS — M25551 Pain in right hip: Secondary | ICD-10-CM | POA: Diagnosis not present

## 2021-10-14 DIAGNOSIS — M542 Cervicalgia: Secondary | ICD-10-CM | POA: Diagnosis not present

## 2021-10-18 DIAGNOSIS — N186 End stage renal disease: Secondary | ICD-10-CM | POA: Diagnosis not present

## 2021-10-18 DIAGNOSIS — N2581 Secondary hyperparathyroidism of renal origin: Secondary | ICD-10-CM | POA: Diagnosis not present

## 2021-10-18 DIAGNOSIS — Z992 Dependence on renal dialysis: Secondary | ICD-10-CM | POA: Diagnosis not present

## 2021-10-19 DIAGNOSIS — R278 Other lack of coordination: Secondary | ICD-10-CM | POA: Diagnosis not present

## 2021-10-19 DIAGNOSIS — M6281 Muscle weakness (generalized): Secondary | ICD-10-CM | POA: Diagnosis not present

## 2021-10-19 DIAGNOSIS — M25551 Pain in right hip: Secondary | ICD-10-CM | POA: Diagnosis not present

## 2021-10-19 DIAGNOSIS — E1142 Type 2 diabetes mellitus with diabetic polyneuropathy: Secondary | ICD-10-CM | POA: Diagnosis not present

## 2021-10-19 DIAGNOSIS — Z79899 Other long term (current) drug therapy: Secondary | ICD-10-CM | POA: Diagnosis not present

## 2021-10-19 DIAGNOSIS — L02222 Furuncle of back [any part, except buttock]: Secondary | ICD-10-CM | POA: Diagnosis not present

## 2021-10-19 DIAGNOSIS — Z9181 History of falling: Secondary | ICD-10-CM | POA: Diagnosis not present

## 2021-10-19 DIAGNOSIS — M542 Cervicalgia: Secondary | ICD-10-CM | POA: Diagnosis not present

## 2021-10-19 DIAGNOSIS — E1143 Type 2 diabetes mellitus with diabetic autonomic (poly)neuropathy: Secondary | ICD-10-CM | POA: Diagnosis not present

## 2021-10-21 DIAGNOSIS — M6281 Muscle weakness (generalized): Secondary | ICD-10-CM | POA: Diagnosis not present

## 2021-10-21 DIAGNOSIS — M542 Cervicalgia: Secondary | ICD-10-CM | POA: Diagnosis not present

## 2021-10-21 DIAGNOSIS — Z9181 History of falling: Secondary | ICD-10-CM | POA: Diagnosis not present

## 2021-10-21 DIAGNOSIS — R278 Other lack of coordination: Secondary | ICD-10-CM | POA: Diagnosis not present

## 2021-10-21 DIAGNOSIS — M25551 Pain in right hip: Secondary | ICD-10-CM | POA: Diagnosis not present

## 2021-10-25 DIAGNOSIS — Z992 Dependence on renal dialysis: Secondary | ICD-10-CM | POA: Diagnosis not present

## 2021-10-25 DIAGNOSIS — N2581 Secondary hyperparathyroidism of renal origin: Secondary | ICD-10-CM | POA: Diagnosis not present

## 2021-10-25 DIAGNOSIS — N186 End stage renal disease: Secondary | ICD-10-CM | POA: Diagnosis not present

## 2021-10-25 DIAGNOSIS — E876 Hypokalemia: Secondary | ICD-10-CM | POA: Diagnosis not present

## 2021-10-25 DIAGNOSIS — D509 Iron deficiency anemia, unspecified: Secondary | ICD-10-CM | POA: Diagnosis not present

## 2021-10-26 DIAGNOSIS — M25551 Pain in right hip: Secondary | ICD-10-CM | POA: Diagnosis not present

## 2021-10-26 DIAGNOSIS — M542 Cervicalgia: Secondary | ICD-10-CM | POA: Diagnosis not present

## 2021-10-26 DIAGNOSIS — R278 Other lack of coordination: Secondary | ICD-10-CM | POA: Diagnosis not present

## 2021-10-26 DIAGNOSIS — Z9181 History of falling: Secondary | ICD-10-CM | POA: Diagnosis not present

## 2021-10-26 DIAGNOSIS — M6281 Muscle weakness (generalized): Secondary | ICD-10-CM | POA: Diagnosis not present

## 2021-10-27 DIAGNOSIS — Z9181 History of falling: Secondary | ICD-10-CM | POA: Diagnosis not present

## 2021-10-27 DIAGNOSIS — M6281 Muscle weakness (generalized): Secondary | ICD-10-CM | POA: Diagnosis not present

## 2021-10-27 DIAGNOSIS — M19072 Primary osteoarthritis, left ankle and foot: Secondary | ICD-10-CM | POA: Diagnosis not present

## 2021-10-27 DIAGNOSIS — Z993 Dependence on wheelchair: Secondary | ICD-10-CM | POA: Diagnosis not present

## 2021-10-27 DIAGNOSIS — S82422D Displaced transverse fracture of shaft of left fibula, subsequent encounter for closed fracture with routine healing: Secondary | ICD-10-CM | POA: Diagnosis not present

## 2021-10-28 DIAGNOSIS — M6281 Muscle weakness (generalized): Secondary | ICD-10-CM | POA: Diagnosis not present

## 2021-10-28 DIAGNOSIS — M542 Cervicalgia: Secondary | ICD-10-CM | POA: Diagnosis not present

## 2021-10-28 DIAGNOSIS — R278 Other lack of coordination: Secondary | ICD-10-CM | POA: Diagnosis not present

## 2021-10-28 DIAGNOSIS — Z9181 History of falling: Secondary | ICD-10-CM | POA: Diagnosis not present

## 2021-10-28 DIAGNOSIS — M25551 Pain in right hip: Secondary | ICD-10-CM | POA: Diagnosis not present

## 2021-11-01 DIAGNOSIS — E876 Hypokalemia: Secondary | ICD-10-CM | POA: Diagnosis not present

## 2021-11-01 DIAGNOSIS — N186 End stage renal disease: Secondary | ICD-10-CM | POA: Diagnosis not present

## 2021-11-01 DIAGNOSIS — Z992 Dependence on renal dialysis: Secondary | ICD-10-CM | POA: Diagnosis not present

## 2021-11-01 DIAGNOSIS — N2581 Secondary hyperparathyroidism of renal origin: Secondary | ICD-10-CM | POA: Diagnosis not present

## 2021-11-02 DIAGNOSIS — Z992 Dependence on renal dialysis: Secondary | ICD-10-CM | POA: Diagnosis not present

## 2021-11-02 DIAGNOSIS — M25551 Pain in right hip: Secondary | ICD-10-CM | POA: Diagnosis not present

## 2021-11-02 DIAGNOSIS — K219 Gastro-esophageal reflux disease without esophagitis: Secondary | ICD-10-CM | POA: Diagnosis not present

## 2021-11-02 DIAGNOSIS — F331 Major depressive disorder, recurrent, moderate: Secondary | ICD-10-CM | POA: Diagnosis not present

## 2021-11-02 DIAGNOSIS — M542 Cervicalgia: Secondary | ICD-10-CM | POA: Diagnosis not present

## 2021-11-02 DIAGNOSIS — M6281 Muscle weakness (generalized): Secondary | ICD-10-CM | POA: Diagnosis not present

## 2021-11-02 DIAGNOSIS — N186 End stage renal disease: Secondary | ICD-10-CM | POA: Diagnosis not present

## 2021-11-02 DIAGNOSIS — R278 Other lack of coordination: Secondary | ICD-10-CM | POA: Diagnosis not present

## 2021-11-02 DIAGNOSIS — Z9181 History of falling: Secondary | ICD-10-CM | POA: Diagnosis not present

## 2021-11-02 DIAGNOSIS — M1909 Primary osteoarthritis, other specified site: Secondary | ICD-10-CM | POA: Diagnosis not present

## 2021-11-03 DIAGNOSIS — M542 Cervicalgia: Secondary | ICD-10-CM | POA: Diagnosis not present

## 2021-11-03 DIAGNOSIS — E1142 Type 2 diabetes mellitus with diabetic polyneuropathy: Secondary | ICD-10-CM | POA: Diagnosis not present

## 2021-11-03 DIAGNOSIS — M25551 Pain in right hip: Secondary | ICD-10-CM | POA: Diagnosis not present

## 2021-11-03 DIAGNOSIS — M6281 Muscle weakness (generalized): Secondary | ICD-10-CM | POA: Diagnosis not present

## 2021-11-03 DIAGNOSIS — G894 Chronic pain syndrome: Secondary | ICD-10-CM | POA: Diagnosis not present

## 2021-11-03 DIAGNOSIS — R278 Other lack of coordination: Secondary | ICD-10-CM | POA: Diagnosis not present

## 2021-11-03 DIAGNOSIS — Z9181 History of falling: Secondary | ICD-10-CM | POA: Diagnosis not present

## 2021-11-04 DIAGNOSIS — M25551 Pain in right hip: Secondary | ICD-10-CM | POA: Diagnosis not present

## 2021-11-04 DIAGNOSIS — M6281 Muscle weakness (generalized): Secondary | ICD-10-CM | POA: Diagnosis not present

## 2021-11-04 DIAGNOSIS — M542 Cervicalgia: Secondary | ICD-10-CM | POA: Diagnosis not present

## 2021-11-04 DIAGNOSIS — R278 Other lack of coordination: Secondary | ICD-10-CM | POA: Diagnosis not present

## 2021-11-04 DIAGNOSIS — Z9181 History of falling: Secondary | ICD-10-CM | POA: Diagnosis not present

## 2021-11-08 DIAGNOSIS — Z23 Encounter for immunization: Secondary | ICD-10-CM | POA: Diagnosis not present

## 2021-11-08 DIAGNOSIS — N2581 Secondary hyperparathyroidism of renal origin: Secondary | ICD-10-CM | POA: Diagnosis not present

## 2021-11-08 DIAGNOSIS — N186 End stage renal disease: Secondary | ICD-10-CM | POA: Diagnosis not present

## 2021-11-08 DIAGNOSIS — Z992 Dependence on renal dialysis: Secondary | ICD-10-CM | POA: Diagnosis not present

## 2021-11-09 DIAGNOSIS — M6281 Muscle weakness (generalized): Secondary | ICD-10-CM | POA: Diagnosis not present

## 2021-11-09 DIAGNOSIS — Z79891 Long term (current) use of opiate analgesic: Secondary | ICD-10-CM | POA: Diagnosis not present

## 2021-11-09 DIAGNOSIS — M542 Cervicalgia: Secondary | ICD-10-CM | POA: Diagnosis not present

## 2021-11-09 DIAGNOSIS — Z992 Dependence on renal dialysis: Secondary | ICD-10-CM | POA: Diagnosis not present

## 2021-11-09 DIAGNOSIS — G894 Chronic pain syndrome: Secondary | ICD-10-CM | POA: Diagnosis not present

## 2021-11-09 DIAGNOSIS — R278 Other lack of coordination: Secondary | ICD-10-CM | POA: Diagnosis not present

## 2021-11-09 DIAGNOSIS — M25551 Pain in right hip: Secondary | ICD-10-CM | POA: Diagnosis not present

## 2021-11-09 DIAGNOSIS — F331 Major depressive disorder, recurrent, moderate: Secondary | ICD-10-CM | POA: Diagnosis not present

## 2021-11-09 DIAGNOSIS — Z9181 History of falling: Secondary | ICD-10-CM | POA: Diagnosis not present

## 2021-11-10 DIAGNOSIS — N2581 Secondary hyperparathyroidism of renal origin: Secondary | ICD-10-CM | POA: Diagnosis not present

## 2021-11-10 DIAGNOSIS — F419 Anxiety disorder, unspecified: Secondary | ICD-10-CM | POA: Diagnosis not present

## 2021-11-10 DIAGNOSIS — E876 Hypokalemia: Secondary | ICD-10-CM | POA: Diagnosis not present

## 2021-11-10 DIAGNOSIS — Z992 Dependence on renal dialysis: Secondary | ICD-10-CM | POA: Diagnosis not present

## 2021-11-10 DIAGNOSIS — F331 Major depressive disorder, recurrent, moderate: Secondary | ICD-10-CM | POA: Diagnosis not present

## 2021-11-10 DIAGNOSIS — F5102 Adjustment insomnia: Secondary | ICD-10-CM | POA: Diagnosis not present

## 2021-11-10 DIAGNOSIS — Z794 Long term (current) use of insulin: Secondary | ICD-10-CM | POA: Diagnosis not present

## 2021-11-10 DIAGNOSIS — E119 Type 2 diabetes mellitus without complications: Secondary | ICD-10-CM | POA: Diagnosis not present

## 2021-11-10 DIAGNOSIS — N186 End stage renal disease: Secondary | ICD-10-CM | POA: Diagnosis not present

## 2021-11-11 DIAGNOSIS — M25551 Pain in right hip: Secondary | ICD-10-CM | POA: Diagnosis not present

## 2021-11-11 DIAGNOSIS — R278 Other lack of coordination: Secondary | ICD-10-CM | POA: Diagnosis not present

## 2021-11-11 DIAGNOSIS — M542 Cervicalgia: Secondary | ICD-10-CM | POA: Diagnosis not present

## 2021-11-11 DIAGNOSIS — M6281 Muscle weakness (generalized): Secondary | ICD-10-CM | POA: Diagnosis not present

## 2021-11-11 DIAGNOSIS — Z9181 History of falling: Secondary | ICD-10-CM | POA: Diagnosis not present

## 2021-11-15 DIAGNOSIS — Z992 Dependence on renal dialysis: Secondary | ICD-10-CM | POA: Diagnosis not present

## 2021-11-15 DIAGNOSIS — N2581 Secondary hyperparathyroidism of renal origin: Secondary | ICD-10-CM | POA: Diagnosis not present

## 2021-11-15 DIAGNOSIS — N186 End stage renal disease: Secondary | ICD-10-CM | POA: Diagnosis not present

## 2021-11-16 DIAGNOSIS — M25551 Pain in right hip: Secondary | ICD-10-CM | POA: Diagnosis not present

## 2021-11-16 DIAGNOSIS — R278 Other lack of coordination: Secondary | ICD-10-CM | POA: Diagnosis not present

## 2021-11-16 DIAGNOSIS — Z9181 History of falling: Secondary | ICD-10-CM | POA: Diagnosis not present

## 2021-11-16 DIAGNOSIS — M542 Cervicalgia: Secondary | ICD-10-CM | POA: Diagnosis not present

## 2021-11-16 DIAGNOSIS — M6281 Muscle weakness (generalized): Secondary | ICD-10-CM | POA: Diagnosis not present

## 2021-11-18 DIAGNOSIS — M542 Cervicalgia: Secondary | ICD-10-CM | POA: Diagnosis not present

## 2021-11-18 DIAGNOSIS — Z9181 History of falling: Secondary | ICD-10-CM | POA: Diagnosis not present

## 2021-11-18 DIAGNOSIS — M6281 Muscle weakness (generalized): Secondary | ICD-10-CM | POA: Diagnosis not present

## 2021-11-18 DIAGNOSIS — M25551 Pain in right hip: Secondary | ICD-10-CM | POA: Diagnosis not present

## 2021-11-18 DIAGNOSIS — R278 Other lack of coordination: Secondary | ICD-10-CM | POA: Diagnosis not present

## 2021-11-22 DIAGNOSIS — E876 Hypokalemia: Secondary | ICD-10-CM | POA: Diagnosis not present

## 2021-11-22 DIAGNOSIS — D509 Iron deficiency anemia, unspecified: Secondary | ICD-10-CM | POA: Diagnosis not present

## 2021-11-22 DIAGNOSIS — N186 End stage renal disease: Secondary | ICD-10-CM | POA: Diagnosis not present

## 2021-11-22 DIAGNOSIS — N2581 Secondary hyperparathyroidism of renal origin: Secondary | ICD-10-CM | POA: Diagnosis not present

## 2021-11-22 DIAGNOSIS — Z992 Dependence on renal dialysis: Secondary | ICD-10-CM | POA: Diagnosis not present

## 2021-11-23 DIAGNOSIS — M6281 Muscle weakness (generalized): Secondary | ICD-10-CM | POA: Diagnosis not present

## 2021-11-23 DIAGNOSIS — M25551 Pain in right hip: Secondary | ICD-10-CM | POA: Diagnosis not present

## 2021-11-23 DIAGNOSIS — M542 Cervicalgia: Secondary | ICD-10-CM | POA: Diagnosis not present

## 2021-11-23 DIAGNOSIS — Z9181 History of falling: Secondary | ICD-10-CM | POA: Diagnosis not present

## 2021-11-23 DIAGNOSIS — R278 Other lack of coordination: Secondary | ICD-10-CM | POA: Diagnosis not present

## 2021-11-25 DIAGNOSIS — R278 Other lack of coordination: Secondary | ICD-10-CM | POA: Diagnosis not present

## 2021-11-25 DIAGNOSIS — M25551 Pain in right hip: Secondary | ICD-10-CM | POA: Diagnosis not present

## 2021-11-25 DIAGNOSIS — M542 Cervicalgia: Secondary | ICD-10-CM | POA: Diagnosis not present

## 2021-11-25 DIAGNOSIS — Z9181 History of falling: Secondary | ICD-10-CM | POA: Diagnosis not present

## 2021-11-25 DIAGNOSIS — M6281 Muscle weakness (generalized): Secondary | ICD-10-CM | POA: Diagnosis not present

## 2021-11-27 DIAGNOSIS — M6281 Muscle weakness (generalized): Secondary | ICD-10-CM | POA: Diagnosis not present

## 2021-11-27 DIAGNOSIS — Z993 Dependence on wheelchair: Secondary | ICD-10-CM | POA: Diagnosis not present

## 2021-11-27 DIAGNOSIS — M19072 Primary osteoarthritis, left ankle and foot: Secondary | ICD-10-CM | POA: Diagnosis not present

## 2021-11-27 DIAGNOSIS — S82422D Displaced transverse fracture of shaft of left fibula, subsequent encounter for closed fracture with routine healing: Secondary | ICD-10-CM | POA: Diagnosis not present

## 2021-11-27 DIAGNOSIS — Z9181 History of falling: Secondary | ICD-10-CM | POA: Diagnosis not present

## 2021-11-29 DIAGNOSIS — N186 End stage renal disease: Secondary | ICD-10-CM | POA: Diagnosis not present

## 2021-11-29 DIAGNOSIS — E876 Hypokalemia: Secondary | ICD-10-CM | POA: Diagnosis not present

## 2021-11-29 DIAGNOSIS — Z992 Dependence on renal dialysis: Secondary | ICD-10-CM | POA: Diagnosis not present

## 2021-11-29 DIAGNOSIS — N2581 Secondary hyperparathyroidism of renal origin: Secondary | ICD-10-CM | POA: Diagnosis not present

## 2021-11-30 DIAGNOSIS — M6281 Muscle weakness (generalized): Secondary | ICD-10-CM | POA: Diagnosis not present

## 2021-11-30 DIAGNOSIS — Z9181 History of falling: Secondary | ICD-10-CM | POA: Diagnosis not present

## 2021-11-30 DIAGNOSIS — R278 Other lack of coordination: Secondary | ICD-10-CM | POA: Diagnosis not present

## 2021-11-30 DIAGNOSIS — M542 Cervicalgia: Secondary | ICD-10-CM | POA: Diagnosis not present

## 2021-11-30 DIAGNOSIS — M25551 Pain in right hip: Secondary | ICD-10-CM | POA: Diagnosis not present

## 2021-12-02 DIAGNOSIS — M6281 Muscle weakness (generalized): Secondary | ICD-10-CM | POA: Diagnosis not present

## 2021-12-02 DIAGNOSIS — R278 Other lack of coordination: Secondary | ICD-10-CM | POA: Diagnosis not present

## 2021-12-02 DIAGNOSIS — Z9181 History of falling: Secondary | ICD-10-CM | POA: Diagnosis not present

## 2021-12-02 DIAGNOSIS — M542 Cervicalgia: Secondary | ICD-10-CM | POA: Diagnosis not present

## 2021-12-02 DIAGNOSIS — M25551 Pain in right hip: Secondary | ICD-10-CM | POA: Diagnosis not present

## 2021-12-04 DIAGNOSIS — M25551 Pain in right hip: Secondary | ICD-10-CM | POA: Diagnosis not present

## 2021-12-04 DIAGNOSIS — M542 Cervicalgia: Secondary | ICD-10-CM | POA: Diagnosis not present

## 2021-12-04 DIAGNOSIS — R278 Other lack of coordination: Secondary | ICD-10-CM | POA: Diagnosis not present

## 2021-12-04 DIAGNOSIS — M6281 Muscle weakness (generalized): Secondary | ICD-10-CM | POA: Diagnosis not present

## 2021-12-04 DIAGNOSIS — Z9181 History of falling: Secondary | ICD-10-CM | POA: Diagnosis not present

## 2021-12-05 DIAGNOSIS — Z9181 History of falling: Secondary | ICD-10-CM | POA: Diagnosis not present

## 2021-12-05 DIAGNOSIS — R278 Other lack of coordination: Secondary | ICD-10-CM | POA: Diagnosis not present

## 2021-12-05 DIAGNOSIS — M6281 Muscle weakness (generalized): Secondary | ICD-10-CM | POA: Diagnosis not present

## 2021-12-05 DIAGNOSIS — M25551 Pain in right hip: Secondary | ICD-10-CM | POA: Diagnosis not present

## 2021-12-05 DIAGNOSIS — M542 Cervicalgia: Secondary | ICD-10-CM | POA: Diagnosis not present

## 2021-12-07 DIAGNOSIS — N186 End stage renal disease: Secondary | ICD-10-CM | POA: Diagnosis not present

## 2021-12-07 DIAGNOSIS — E1022 Type 1 diabetes mellitus with diabetic chronic kidney disease: Secondary | ICD-10-CM | POA: Diagnosis not present

## 2021-12-07 DIAGNOSIS — E1142 Type 2 diabetes mellitus with diabetic polyneuropathy: Secondary | ICD-10-CM | POA: Diagnosis not present

## 2021-12-07 DIAGNOSIS — Z992 Dependence on renal dialysis: Secondary | ICD-10-CM | POA: Diagnosis not present

## 2021-12-07 DIAGNOSIS — G894 Chronic pain syndrome: Secondary | ICD-10-CM | POA: Diagnosis not present

## 2021-12-08 DIAGNOSIS — E876 Hypokalemia: Secondary | ICD-10-CM | POA: Diagnosis not present

## 2021-12-08 DIAGNOSIS — N186 End stage renal disease: Secondary | ICD-10-CM | POA: Diagnosis not present

## 2021-12-08 DIAGNOSIS — Z992 Dependence on renal dialysis: Secondary | ICD-10-CM | POA: Diagnosis not present

## 2021-12-08 DIAGNOSIS — E119 Type 2 diabetes mellitus without complications: Secondary | ICD-10-CM | POA: Diagnosis not present

## 2021-12-08 DIAGNOSIS — Z794 Long term (current) use of insulin: Secondary | ICD-10-CM | POA: Diagnosis not present

## 2021-12-08 DIAGNOSIS — N2581 Secondary hyperparathyroidism of renal origin: Secondary | ICD-10-CM | POA: Diagnosis not present

## 2021-12-08 DIAGNOSIS — R278 Other lack of coordination: Secondary | ICD-10-CM | POA: Diagnosis not present

## 2021-12-08 DIAGNOSIS — M6281 Muscle weakness (generalized): Secondary | ICD-10-CM | POA: Diagnosis not present

## 2021-12-08 DIAGNOSIS — Z9181 History of falling: Secondary | ICD-10-CM | POA: Diagnosis not present

## 2021-12-08 DIAGNOSIS — M542 Cervicalgia: Secondary | ICD-10-CM | POA: Diagnosis not present

## 2021-12-08 DIAGNOSIS — M25551 Pain in right hip: Secondary | ICD-10-CM | POA: Diagnosis not present

## 2021-12-09 DIAGNOSIS — M6281 Muscle weakness (generalized): Secondary | ICD-10-CM | POA: Diagnosis not present

## 2021-12-09 DIAGNOSIS — M542 Cervicalgia: Secondary | ICD-10-CM | POA: Diagnosis not present

## 2021-12-09 DIAGNOSIS — R278 Other lack of coordination: Secondary | ICD-10-CM | POA: Diagnosis not present

## 2021-12-09 DIAGNOSIS — M25551 Pain in right hip: Secondary | ICD-10-CM | POA: Diagnosis not present

## 2021-12-09 DIAGNOSIS — Z9181 History of falling: Secondary | ICD-10-CM | POA: Diagnosis not present

## 2021-12-13 DIAGNOSIS — N186 End stage renal disease: Secondary | ICD-10-CM | POA: Diagnosis not present

## 2021-12-13 DIAGNOSIS — M6281 Muscle weakness (generalized): Secondary | ICD-10-CM | POA: Diagnosis not present

## 2021-12-13 DIAGNOSIS — N2581 Secondary hyperparathyroidism of renal origin: Secondary | ICD-10-CM | POA: Diagnosis not present

## 2021-12-13 DIAGNOSIS — R278 Other lack of coordination: Secondary | ICD-10-CM | POA: Diagnosis not present

## 2021-12-13 DIAGNOSIS — Z9181 History of falling: Secondary | ICD-10-CM | POA: Diagnosis not present

## 2021-12-13 DIAGNOSIS — Z992 Dependence on renal dialysis: Secondary | ICD-10-CM | POA: Diagnosis not present

## 2021-12-13 DIAGNOSIS — M25551 Pain in right hip: Secondary | ICD-10-CM | POA: Diagnosis not present

## 2021-12-13 DIAGNOSIS — M542 Cervicalgia: Secondary | ICD-10-CM | POA: Diagnosis not present

## 2021-12-14 DIAGNOSIS — M150 Primary generalized (osteo)arthritis: Secondary | ICD-10-CM | POA: Diagnosis not present

## 2021-12-14 DIAGNOSIS — F331 Major depressive disorder, recurrent, moderate: Secondary | ICD-10-CM | POA: Diagnosis not present

## 2021-12-14 DIAGNOSIS — M542 Cervicalgia: Secondary | ICD-10-CM | POA: Diagnosis not present

## 2021-12-14 DIAGNOSIS — Z9181 History of falling: Secondary | ICD-10-CM | POA: Diagnosis not present

## 2021-12-14 DIAGNOSIS — Z79891 Long term (current) use of opiate analgesic: Secondary | ICD-10-CM | POA: Diagnosis not present

## 2021-12-14 DIAGNOSIS — R278 Other lack of coordination: Secondary | ICD-10-CM | POA: Diagnosis not present

## 2021-12-14 DIAGNOSIS — M6281 Muscle weakness (generalized): Secondary | ICD-10-CM | POA: Diagnosis not present

## 2021-12-14 DIAGNOSIS — G894 Chronic pain syndrome: Secondary | ICD-10-CM | POA: Diagnosis not present

## 2021-12-14 DIAGNOSIS — M25551 Pain in right hip: Secondary | ICD-10-CM | POA: Diagnosis not present

## 2021-12-15 DIAGNOSIS — M25551 Pain in right hip: Secondary | ICD-10-CM | POA: Diagnosis not present

## 2021-12-15 DIAGNOSIS — M6281 Muscle weakness (generalized): Secondary | ICD-10-CM | POA: Diagnosis not present

## 2021-12-15 DIAGNOSIS — Z9181 History of falling: Secondary | ICD-10-CM | POA: Diagnosis not present

## 2021-12-15 DIAGNOSIS — R278 Other lack of coordination: Secondary | ICD-10-CM | POA: Diagnosis not present

## 2021-12-15 DIAGNOSIS — M542 Cervicalgia: Secondary | ICD-10-CM | POA: Diagnosis not present

## 2021-12-16 DIAGNOSIS — M25551 Pain in right hip: Secondary | ICD-10-CM | POA: Diagnosis not present

## 2021-12-16 DIAGNOSIS — R278 Other lack of coordination: Secondary | ICD-10-CM | POA: Diagnosis not present

## 2021-12-16 DIAGNOSIS — M542 Cervicalgia: Secondary | ICD-10-CM | POA: Diagnosis not present

## 2021-12-16 DIAGNOSIS — Z9181 History of falling: Secondary | ICD-10-CM | POA: Diagnosis not present

## 2021-12-16 DIAGNOSIS — M6281 Muscle weakness (generalized): Secondary | ICD-10-CM | POA: Diagnosis not present

## 2021-12-20 DIAGNOSIS — N186 End stage renal disease: Secondary | ICD-10-CM | POA: Diagnosis not present

## 2021-12-20 DIAGNOSIS — Z992 Dependence on renal dialysis: Secondary | ICD-10-CM | POA: Diagnosis not present

## 2021-12-20 DIAGNOSIS — E876 Hypokalemia: Secondary | ICD-10-CM | POA: Diagnosis not present

## 2021-12-20 DIAGNOSIS — N2581 Secondary hyperparathyroidism of renal origin: Secondary | ICD-10-CM | POA: Diagnosis not present

## 2021-12-21 DIAGNOSIS — R278 Other lack of coordination: Secondary | ICD-10-CM | POA: Diagnosis not present

## 2021-12-21 DIAGNOSIS — R0981 Nasal congestion: Secondary | ICD-10-CM | POA: Diagnosis not present

## 2021-12-21 DIAGNOSIS — M542 Cervicalgia: Secondary | ICD-10-CM | POA: Diagnosis not present

## 2021-12-21 DIAGNOSIS — E1165 Type 2 diabetes mellitus with hyperglycemia: Secondary | ICD-10-CM | POA: Diagnosis not present

## 2021-12-21 DIAGNOSIS — K219 Gastro-esophageal reflux disease without esophagitis: Secondary | ICD-10-CM | POA: Diagnosis not present

## 2021-12-21 DIAGNOSIS — M25551 Pain in right hip: Secondary | ICD-10-CM | POA: Diagnosis not present

## 2021-12-21 DIAGNOSIS — Z79899 Other long term (current) drug therapy: Secondary | ICD-10-CM | POA: Diagnosis not present

## 2021-12-21 DIAGNOSIS — I10 Essential (primary) hypertension: Secondary | ICD-10-CM | POA: Diagnosis not present

## 2021-12-21 DIAGNOSIS — M6281 Muscle weakness (generalized): Secondary | ICD-10-CM | POA: Diagnosis not present

## 2021-12-21 DIAGNOSIS — E0840 Diabetes mellitus due to underlying condition with diabetic neuropathy, unspecified: Secondary | ICD-10-CM | POA: Diagnosis not present

## 2021-12-21 DIAGNOSIS — Z992 Dependence on renal dialysis: Secondary | ICD-10-CM | POA: Diagnosis not present

## 2021-12-21 DIAGNOSIS — E7800 Pure hypercholesterolemia, unspecified: Secondary | ICD-10-CM | POA: Diagnosis not present

## 2021-12-21 DIAGNOSIS — Z9181 History of falling: Secondary | ICD-10-CM | POA: Diagnosis not present

## 2021-12-21 DIAGNOSIS — F331 Major depressive disorder, recurrent, moderate: Secondary | ICD-10-CM | POA: Diagnosis not present

## 2021-12-21 DIAGNOSIS — N186 End stage renal disease: Secondary | ICD-10-CM | POA: Diagnosis not present

## 2021-12-22 DIAGNOSIS — Z9181 History of falling: Secondary | ICD-10-CM | POA: Diagnosis not present

## 2021-12-22 DIAGNOSIS — M25551 Pain in right hip: Secondary | ICD-10-CM | POA: Diagnosis not present

## 2021-12-22 DIAGNOSIS — R278 Other lack of coordination: Secondary | ICD-10-CM | POA: Diagnosis not present

## 2021-12-22 DIAGNOSIS — M542 Cervicalgia: Secondary | ICD-10-CM | POA: Diagnosis not present

## 2021-12-22 DIAGNOSIS — M6281 Muscle weakness (generalized): Secondary | ICD-10-CM | POA: Diagnosis not present

## 2021-12-23 DIAGNOSIS — M25551 Pain in right hip: Secondary | ICD-10-CM | POA: Diagnosis not present

## 2021-12-23 DIAGNOSIS — R278 Other lack of coordination: Secondary | ICD-10-CM | POA: Diagnosis not present

## 2021-12-23 DIAGNOSIS — M6281 Muscle weakness (generalized): Secondary | ICD-10-CM | POA: Diagnosis not present

## 2021-12-23 DIAGNOSIS — M542 Cervicalgia: Secondary | ICD-10-CM | POA: Diagnosis not present

## 2021-12-23 DIAGNOSIS — Z9181 History of falling: Secondary | ICD-10-CM | POA: Diagnosis not present

## 2021-12-25 DIAGNOSIS — M6281 Muscle weakness (generalized): Secondary | ICD-10-CM | POA: Diagnosis not present

## 2021-12-25 DIAGNOSIS — M19072 Primary osteoarthritis, left ankle and foot: Secondary | ICD-10-CM | POA: Diagnosis not present

## 2021-12-25 DIAGNOSIS — Z9181 History of falling: Secondary | ICD-10-CM | POA: Diagnosis not present

## 2021-12-27 DIAGNOSIS — N2581 Secondary hyperparathyroidism of renal origin: Secondary | ICD-10-CM | POA: Diagnosis not present

## 2021-12-27 DIAGNOSIS — E876 Hypokalemia: Secondary | ICD-10-CM | POA: Diagnosis not present

## 2021-12-27 DIAGNOSIS — Z992 Dependence on renal dialysis: Secondary | ICD-10-CM | POA: Diagnosis not present

## 2021-12-27 DIAGNOSIS — N186 End stage renal disease: Secondary | ICD-10-CM | POA: Diagnosis not present

## 2021-12-28 DIAGNOSIS — M1990 Unspecified osteoarthritis, unspecified site: Secondary | ICD-10-CM | POA: Diagnosis not present

## 2021-12-28 DIAGNOSIS — E1151 Type 2 diabetes mellitus with diabetic peripheral angiopathy without gangrene: Secondary | ICD-10-CM | POA: Diagnosis not present

## 2021-12-28 DIAGNOSIS — K219 Gastro-esophageal reflux disease without esophagitis: Secondary | ICD-10-CM | POA: Diagnosis not present

## 2021-12-28 DIAGNOSIS — J302 Other seasonal allergic rhinitis: Secondary | ICD-10-CM | POA: Diagnosis not present

## 2021-12-28 DIAGNOSIS — Z992 Dependence on renal dialysis: Secondary | ICD-10-CM | POA: Diagnosis not present

## 2021-12-28 DIAGNOSIS — R0981 Nasal congestion: Secondary | ICD-10-CM | POA: Diagnosis not present

## 2021-12-29 DIAGNOSIS — R278 Other lack of coordination: Secondary | ICD-10-CM | POA: Diagnosis not present

## 2021-12-29 DIAGNOSIS — M6281 Muscle weakness (generalized): Secondary | ICD-10-CM | POA: Diagnosis not present

## 2021-12-29 DIAGNOSIS — Z9181 History of falling: Secondary | ICD-10-CM | POA: Diagnosis not present

## 2021-12-29 DIAGNOSIS — M25551 Pain in right hip: Secondary | ICD-10-CM | POA: Diagnosis not present

## 2021-12-29 DIAGNOSIS — M542 Cervicalgia: Secondary | ICD-10-CM | POA: Diagnosis not present

## 2021-12-30 DIAGNOSIS — Z9181 History of falling: Secondary | ICD-10-CM | POA: Diagnosis not present

## 2021-12-30 DIAGNOSIS — M6281 Muscle weakness (generalized): Secondary | ICD-10-CM | POA: Diagnosis not present

## 2021-12-30 DIAGNOSIS — F331 Major depressive disorder, recurrent, moderate: Secondary | ICD-10-CM | POA: Diagnosis not present

## 2021-12-30 DIAGNOSIS — R278 Other lack of coordination: Secondary | ICD-10-CM | POA: Diagnosis not present

## 2021-12-30 DIAGNOSIS — M25551 Pain in right hip: Secondary | ICD-10-CM | POA: Diagnosis not present

## 2021-12-30 DIAGNOSIS — M542 Cervicalgia: Secondary | ICD-10-CM | POA: Diagnosis not present

## 2022-01-03 DIAGNOSIS — E876 Hypokalemia: Secondary | ICD-10-CM | POA: Diagnosis not present

## 2022-01-03 DIAGNOSIS — N2581 Secondary hyperparathyroidism of renal origin: Secondary | ICD-10-CM | POA: Diagnosis not present

## 2022-01-03 DIAGNOSIS — D509 Iron deficiency anemia, unspecified: Secondary | ICD-10-CM | POA: Diagnosis not present

## 2022-01-03 DIAGNOSIS — Z992 Dependence on renal dialysis: Secondary | ICD-10-CM | POA: Diagnosis not present

## 2022-01-03 DIAGNOSIS — N186 End stage renal disease: Secondary | ICD-10-CM | POA: Diagnosis not present

## 2022-01-04 DIAGNOSIS — G894 Chronic pain syndrome: Secondary | ICD-10-CM | POA: Diagnosis not present

## 2022-01-04 DIAGNOSIS — H33313 Horseshoe tear of retina without detachment, bilateral: Secondary | ICD-10-CM | POA: Diagnosis not present

## 2022-01-04 DIAGNOSIS — F331 Major depressive disorder, recurrent, moderate: Secondary | ICD-10-CM | POA: Diagnosis not present

## 2022-01-04 DIAGNOSIS — Z992 Dependence on renal dialysis: Secondary | ICD-10-CM | POA: Diagnosis not present

## 2022-01-04 DIAGNOSIS — R0981 Nasal congestion: Secondary | ICD-10-CM | POA: Diagnosis not present

## 2022-01-04 DIAGNOSIS — E113293 Type 2 diabetes mellitus with mild nonproliferative diabetic retinopathy without macular edema, bilateral: Secondary | ICD-10-CM | POA: Diagnosis not present

## 2022-01-06 DIAGNOSIS — R278 Other lack of coordination: Secondary | ICD-10-CM | POA: Diagnosis not present

## 2022-01-06 DIAGNOSIS — M6281 Muscle weakness (generalized): Secondary | ICD-10-CM | POA: Diagnosis not present

## 2022-01-06 DIAGNOSIS — Z9181 History of falling: Secondary | ICD-10-CM | POA: Diagnosis not present

## 2022-01-06 DIAGNOSIS — M25551 Pain in right hip: Secondary | ICD-10-CM | POA: Diagnosis not present

## 2022-01-06 DIAGNOSIS — M542 Cervicalgia: Secondary | ICD-10-CM | POA: Diagnosis not present

## 2022-01-07 DIAGNOSIS — Z992 Dependence on renal dialysis: Secondary | ICD-10-CM | POA: Diagnosis not present

## 2022-01-07 DIAGNOSIS — E1022 Type 1 diabetes mellitus with diabetic chronic kidney disease: Secondary | ICD-10-CM | POA: Diagnosis not present

## 2022-01-07 DIAGNOSIS — N186 End stage renal disease: Secondary | ICD-10-CM | POA: Diagnosis not present

## 2022-01-09 ENCOUNTER — Emergency Department (HOSPITAL_COMMUNITY): Payer: HMO

## 2022-01-09 ENCOUNTER — Inpatient Hospital Stay (HOSPITAL_COMMUNITY): Payer: HMO

## 2022-01-09 ENCOUNTER — Other Ambulatory Visit: Payer: Self-pay

## 2022-01-09 ENCOUNTER — Encounter (HOSPITAL_COMMUNITY): Payer: Self-pay | Admitting: Emergency Medicine

## 2022-01-09 ENCOUNTER — Inpatient Hospital Stay (HOSPITAL_COMMUNITY)
Admission: EM | Admit: 2022-01-09 | Discharge: 2022-01-16 | DRG: 551 | Disposition: A | Discharge status: Skilled Nursing Facility | Payer: HMO | Attending: Internal Medicine | Admitting: Internal Medicine

## 2022-01-09 DIAGNOSIS — L89101 Pressure ulcer of unspecified part of back, stage 1: Secondary | ICD-10-CM | POA: Diagnosis not present

## 2022-01-09 DIAGNOSIS — K219 Gastro-esophageal reflux disease without esophagitis: Secondary | ICD-10-CM | POA: Diagnosis present

## 2022-01-09 DIAGNOSIS — S3993XA Unspecified injury of pelvis, initial encounter: Secondary | ICD-10-CM | POA: Diagnosis not present

## 2022-01-09 DIAGNOSIS — I120 Hypertensive chronic kidney disease with stage 5 chronic kidney disease or end stage renal disease: Secondary | ICD-10-CM | POA: Diagnosis present

## 2022-01-09 DIAGNOSIS — R109 Unspecified abdominal pain: Secondary | ICD-10-CM | POA: Diagnosis not present

## 2022-01-09 DIAGNOSIS — R0902 Hypoxemia: Secondary | ICD-10-CM | POA: Diagnosis not present

## 2022-01-09 DIAGNOSIS — Z86718 Personal history of other venous thrombosis and embolism: Secondary | ICD-10-CM

## 2022-01-09 DIAGNOSIS — Z7901 Long term (current) use of anticoagulants: Secondary | ICD-10-CM

## 2022-01-09 DIAGNOSIS — M25551 Pain in right hip: Secondary | ICD-10-CM | POA: Diagnosis not present

## 2022-01-09 DIAGNOSIS — E119 Type 2 diabetes mellitus without complications: Secondary | ICD-10-CM

## 2022-01-09 DIAGNOSIS — S22009A Unspecified fracture of unspecified thoracic vertebra, initial encounter for closed fracture: Secondary | ICD-10-CM | POA: Diagnosis present

## 2022-01-09 DIAGNOSIS — N289 Disorder of kidney and ureter, unspecified: Secondary | ICD-10-CM | POA: Diagnosis present

## 2022-01-09 DIAGNOSIS — D631 Anemia in chronic kidney disease: Secondary | ICD-10-CM | POA: Diagnosis not present

## 2022-01-09 DIAGNOSIS — Z794 Long term (current) use of insulin: Secondary | ICD-10-CM

## 2022-01-09 DIAGNOSIS — E559 Vitamin D deficiency, unspecified: Secondary | ICD-10-CM | POA: Diagnosis present

## 2022-01-09 DIAGNOSIS — M546 Pain in thoracic spine: Secondary | ICD-10-CM | POA: Diagnosis not present

## 2022-01-09 DIAGNOSIS — G4733 Obstructive sleep apnea (adult) (pediatric): Secondary | ICD-10-CM | POA: Diagnosis present

## 2022-01-09 DIAGNOSIS — M545 Low back pain, unspecified: Secondary | ICD-10-CM | POA: Diagnosis present

## 2022-01-09 DIAGNOSIS — Z79899 Other long term (current) drug therapy: Secondary | ICD-10-CM

## 2022-01-09 DIAGNOSIS — Z66 Do not resuscitate: Secondary | ICD-10-CM | POA: Diagnosis not present

## 2022-01-09 DIAGNOSIS — R102 Pelvic and perineal pain: Secondary | ICD-10-CM | POA: Diagnosis not present

## 2022-01-09 DIAGNOSIS — N390 Urinary tract infection, site not specified: Secondary | ICD-10-CM | POA: Diagnosis not present

## 2022-01-09 DIAGNOSIS — Z87891 Personal history of nicotine dependence: Secondary | ICD-10-CM

## 2022-01-09 DIAGNOSIS — M6281 Muscle weakness (generalized): Secondary | ICD-10-CM | POA: Diagnosis not present

## 2022-01-09 DIAGNOSIS — F419 Anxiety disorder, unspecified: Secondary | ICD-10-CM | POA: Diagnosis not present

## 2022-01-09 DIAGNOSIS — Z992 Dependence on renal dialysis: Secondary | ICD-10-CM | POA: Diagnosis not present

## 2022-01-09 DIAGNOSIS — E785 Hyperlipidemia, unspecified: Secondary | ICD-10-CM | POA: Diagnosis not present

## 2022-01-09 DIAGNOSIS — R2689 Other abnormalities of gait and mobility: Secondary | ICD-10-CM | POA: Diagnosis not present

## 2022-01-09 DIAGNOSIS — I10 Essential (primary) hypertension: Secondary | ICD-10-CM | POA: Diagnosis present

## 2022-01-09 DIAGNOSIS — Z825 Family history of asthma and other chronic lower respiratory diseases: Secondary | ICD-10-CM

## 2022-01-09 DIAGNOSIS — Z8249 Family history of ischemic heart disease and other diseases of the circulatory system: Secondary | ICD-10-CM

## 2022-01-09 DIAGNOSIS — E875 Hyperkalemia: Secondary | ICD-10-CM | POA: Diagnosis not present

## 2022-01-09 DIAGNOSIS — G2581 Restless legs syndrome: Secondary | ICD-10-CM | POA: Diagnosis not present

## 2022-01-09 DIAGNOSIS — Z981 Arthrodesis status: Secondary | ICD-10-CM

## 2022-01-09 DIAGNOSIS — M4814 Ankylosing hyperostosis [Forestier], thoracic region: Secondary | ICD-10-CM | POA: Diagnosis present

## 2022-01-09 DIAGNOSIS — M1990 Unspecified osteoarthritis, unspecified site: Secondary | ICD-10-CM | POA: Diagnosis not present

## 2022-01-09 DIAGNOSIS — R079 Chest pain, unspecified: Secondary | ICD-10-CM | POA: Diagnosis not present

## 2022-01-09 DIAGNOSIS — N2581 Secondary hyperparathyroidism of renal origin: Secondary | ICD-10-CM | POA: Diagnosis present

## 2022-01-09 DIAGNOSIS — M549 Dorsalgia, unspecified: Secondary | ICD-10-CM | POA: Diagnosis not present

## 2022-01-09 DIAGNOSIS — S3991XA Unspecified injury of abdomen, initial encounter: Secondary | ICD-10-CM | POA: Diagnosis not present

## 2022-01-09 DIAGNOSIS — R52 Pain, unspecified: Secondary | ICD-10-CM | POA: Diagnosis present

## 2022-01-09 DIAGNOSIS — Z20822 Contact with and (suspected) exposure to covid-19: Secondary | ICD-10-CM | POA: Diagnosis present

## 2022-01-09 DIAGNOSIS — M40204 Unspecified kyphosis, thoracic region: Secondary | ICD-10-CM | POA: Diagnosis not present

## 2022-01-09 DIAGNOSIS — E1122 Type 2 diabetes mellitus with diabetic chronic kidney disease: Secondary | ICD-10-CM | POA: Diagnosis not present

## 2022-01-09 DIAGNOSIS — X58XXXA Exposure to other specified factors, initial encounter: Secondary | ICD-10-CM | POA: Diagnosis present

## 2022-01-09 DIAGNOSIS — I959 Hypotension, unspecified: Secondary | ICD-10-CM | POA: Diagnosis not present

## 2022-01-09 DIAGNOSIS — Z9981 Dependence on supplemental oxygen: Secondary | ICD-10-CM

## 2022-01-09 DIAGNOSIS — N186 End stage renal disease: Secondary | ICD-10-CM | POA: Diagnosis not present

## 2022-01-09 DIAGNOSIS — E11649 Type 2 diabetes mellitus with hypoglycemia without coma: Secondary | ICD-10-CM | POA: Diagnosis not present

## 2022-01-09 DIAGNOSIS — G20 Parkinson's disease: Secondary | ICD-10-CM | POA: Diagnosis present

## 2022-01-09 DIAGNOSIS — M8588 Other specified disorders of bone density and structure, other site: Secondary | ICD-10-CM | POA: Diagnosis not present

## 2022-01-09 DIAGNOSIS — J449 Chronic obstructive pulmonary disease, unspecified: Secondary | ICD-10-CM | POA: Diagnosis not present

## 2022-01-09 DIAGNOSIS — M2578 Osteophyte, vertebrae: Secondary | ICD-10-CM | POA: Diagnosis not present

## 2022-01-09 DIAGNOSIS — S22068A Other fracture of T7-T8 thoracic vertebra, initial encounter for closed fracture: Secondary | ICD-10-CM | POA: Diagnosis not present

## 2022-01-09 DIAGNOSIS — Z7189 Other specified counseling: Secondary | ICD-10-CM | POA: Diagnosis not present

## 2022-01-09 DIAGNOSIS — Z7401 Bed confinement status: Secondary | ICD-10-CM | POA: Diagnosis not present

## 2022-01-09 DIAGNOSIS — R682 Dry mouth, unspecified: Secondary | ICD-10-CM | POA: Diagnosis not present

## 2022-01-09 DIAGNOSIS — Z515 Encounter for palliative care: Secondary | ICD-10-CM | POA: Diagnosis not present

## 2022-01-09 DIAGNOSIS — R110 Nausea: Secondary | ICD-10-CM | POA: Diagnosis not present

## 2022-01-09 DIAGNOSIS — M109 Gout, unspecified: Secondary | ICD-10-CM | POA: Diagnosis not present

## 2022-01-09 DIAGNOSIS — G8929 Other chronic pain: Secondary | ICD-10-CM | POA: Diagnosis present

## 2022-01-09 DIAGNOSIS — Z96652 Presence of left artificial knee joint: Secondary | ICD-10-CM | POA: Diagnosis present

## 2022-01-09 DIAGNOSIS — S22069A Unspecified fracture of T7-T8 vertebra, initial encounter for closed fracture: Secondary | ICD-10-CM | POA: Diagnosis not present

## 2022-01-09 DIAGNOSIS — Z9181 History of falling: Secondary | ICD-10-CM | POA: Diagnosis not present

## 2022-01-09 LAB — CBC
HCT: 38.5 % (ref 36.0–46.0)
HCT: 37.7 % (ref 36.0–46.0)
Hemoglobin: 11.9 g/dL — ABNORMAL LOW (ref 12.0–15.0)
Hemoglobin: 11.8 g/dL — ABNORMAL LOW (ref 12.0–15.0)
MCH: 29.5 pg (ref 26.0–34.0)
MCH: 29.8 pg (ref 26.0–34.0)
MCHC: 30.9 g/dL (ref 30.0–36.0)
MCHC: 31.3 g/dL (ref 30.0–36.0)
MCV: 95.5 fL (ref 80.0–100.0)
MCV: 95.2 fL (ref 80.0–100.0)
Platelets: 236 10*3/uL (ref 150–400)
Platelets: 229 10*3/uL (ref 150–400)
RBC: 4.03 MIL/uL (ref 3.87–5.11)
RBC: 3.96 MIL/uL (ref 3.87–5.11)
RDW: 15.1 % (ref 11.5–15.5)
RDW: 15.1 % (ref 11.5–15.5)
WBC: 16.3 10*3/uL — ABNORMAL HIGH (ref 4.0–10.5)
WBC: 15.7 10*3/uL — ABNORMAL HIGH (ref 4.0–10.5)
nRBC: 0 % (ref 0.0–0.2)
nRBC: 0 % (ref 0.0–0.2)

## 2022-01-09 LAB — RENAL FUNCTION PANEL
Albumin: 3 g/dL — ABNORMAL LOW (ref 3.5–5.0)
Anion gap: 10 (ref 5–15)
BUN: 59 mg/dL — ABNORMAL HIGH (ref 8–23)
CO2: 28 mmol/L (ref 22–32)
Calcium: 9.1 mg/dL (ref 8.9–10.3)
Chloride: 99 mmol/L (ref 98–111)
Creatinine, Ser: 6.61 mg/dL — ABNORMAL HIGH (ref 0.44–1.00)
GFR, Estimated: 6 mL/min — ABNORMAL LOW (ref >60)
Glucose, Bld: 95 mg/dL (ref 70–99)
Phosphorus: 5.8 mg/dL — ABNORMAL HIGH (ref 2.5–4.6)
Potassium: 5.7 mmol/L — ABNORMAL HIGH (ref 3.5–5.1)
Sodium: 137 mmol/L (ref 135–145)

## 2022-01-09 LAB — GLUCOSE, CAPILLARY: Glucose-Capillary: 166 mg/dL — ABNORMAL HIGH (ref 70–99)

## 2022-01-09 LAB — BASIC METABOLIC PANEL
Anion gap: 9 (ref 5–15)
BUN: 59 mg/dL — ABNORMAL HIGH (ref 8–23)
CO2: 29 mmol/L (ref 22–32)
Calcium: 9.1 mg/dL (ref 8.9–10.3)
Chloride: 100 mmol/L (ref 98–111)
Creatinine, Ser: 6.16 mg/dL — ABNORMAL HIGH (ref 0.44–1.00)
GFR, Estimated: 7 mL/min — ABNORMAL LOW (ref >60)
Glucose, Bld: 78 mg/dL (ref 70–99)
Potassium: 5.3 mmol/L — ABNORMAL HIGH (ref 3.5–5.1)
Sodium: 138 mmol/L (ref 135–145)

## 2022-01-09 LAB — RESP PANEL BY RT-PCR (FLU A&B, COVID) ARPGX2
Influenza A by PCR: NEGATIVE
Influenza B by PCR: NEGATIVE
SARS Coronavirus 2 by RT PCR: NEGATIVE

## 2022-01-09 LAB — PROTIME-INR
INR: 1.5 — ABNORMAL HIGH (ref 0.8–1.2)
Prothrombin Time: 17.6 seconds — ABNORMAL HIGH (ref 11.4–15.2)

## 2022-01-09 LAB — HEMOGLOBIN A1C
Hgb A1c MFr Bld: 7.2 % — ABNORMAL HIGH (ref 4.8–5.6)
Mean Plasma Glucose: 159.94 mg/dL

## 2022-01-09 LAB — HEPATITIS B SURFACE ANTIBODY,QUALITATIVE: Hep B S Ab: NONREACTIVE

## 2022-01-09 LAB — HEPATITIS B SURFACE ANTIGEN: Hepatitis B Surface Ag: NONREACTIVE

## 2022-01-09 LAB — CBG MONITORING, ED: Glucose-Capillary: 79 mg/dL (ref 70–99)

## 2022-01-09 MED ORDER — SODIUM CHLORIDE 0.9 % IV SOLN: 100.0000 mL | INTRAVENOUS | Status: DC | PRN

## 2022-01-09 MED ORDER — GABAPENTIN 300 MG PO CAPS
300.0000 mg | ORAL_CAPSULE | Freq: Three times a day (TID) | ORAL | Status: DC
  Administered 2022-01-09 – 2022-01-11 (×5): 300 mg via ORAL
  Filled 2022-01-09 (×5): qty 1

## 2022-01-09 MED ORDER — DOXERCALCIFEROL 4 MCG/2ML IV SOLN
2.0000 ug | INTRAVENOUS | Status: DC
  Administered 2022-01-10: 2 ug via INTRAVENOUS
  Filled 2022-01-09: qty 2

## 2022-01-09 MED ORDER — APIXABAN 2.5 MG PO TABS
2.5000 mg | ORAL_TABLET | Freq: Two times a day (BID) | ORAL | Status: DC
  Administered 2022-01-09 – 2022-01-16 (×14): 2.5 mg via ORAL
  Filled 2022-01-09 (×14): qty 1

## 2022-01-09 MED ORDER — LIDOCAINE-PRILOCAINE 2.5-2.5 % EX CREA: 1.0000 "application " | TOPICAL_CREAM | CUTANEOUS | Status: DC | PRN

## 2022-01-09 MED ORDER — INSULIN ASPART 100 UNIT/ML IJ SOLN
0.0000 [IU] | Freq: Three times a day (TID) | INTRAMUSCULAR | Status: DC
  Administered 2022-01-10 (×2): 2 [IU] via SUBCUTANEOUS
  Administered 2022-01-11: 3 [IU] via SUBCUTANEOUS
  Administered 2022-01-11 – 2022-01-15 (×6): 2 [IU] via SUBCUTANEOUS

## 2022-01-09 MED ORDER — OXYCODONE HCL 5 MG PO TABS
5.0000 mg | ORAL_TABLET | ORAL | Status: DC | PRN
  Administered 2022-01-09 – 2022-01-16 (×15): 5 mg via ORAL
  Filled 2022-01-09 (×15): qty 1

## 2022-01-09 MED ORDER — LORAZEPAM 2 MG/ML IJ SOLN
0.5000 mg | Freq: Once | INTRAMUSCULAR | Status: AC
  Administered 2022-01-09: 0.5 mg via INTRAVENOUS
  Filled 2022-01-09: qty 1

## 2022-01-09 MED ORDER — CALCIUM ACETATE (PHOS BINDER) 667 MG PO CAPS
667.0000 mg | ORAL_CAPSULE | Freq: Three times a day (TID) | ORAL | Status: DC
  Administered 2022-01-10 – 2022-01-16 (×16): 667 mg via ORAL
  Filled 2022-01-09 (×20): qty 1

## 2022-01-09 MED ORDER — MIDODRINE HCL 5 MG PO TABS: 10.0000 mg | ORAL_TABLET | ORAL | Status: DC

## 2022-01-09 MED ORDER — MORPHINE SULFATE (PF) 2 MG/ML IV SOLN
2.0000 mg | Freq: Once | INTRAVENOUS | Status: AC
  Administered 2022-01-09: 2 mg via INTRAVENOUS
  Filled 2022-01-09: qty 1

## 2022-01-09 MED ORDER — MIDODRINE HCL 5 MG PO TABS
10.0000 mg | ORAL_TABLET | ORAL | Status: DC
  Administered 2022-01-10 – 2022-01-14 (×3): 10 mg via ORAL
  Filled 2022-01-09 (×2): qty 2

## 2022-01-09 MED ORDER — ATORVASTATIN CALCIUM 10 MG PO TABS
20.0000 mg | ORAL_TABLET | Freq: Every day | ORAL | Status: DC
  Administered 2022-01-09 – 2022-01-16 (×8): 20 mg via ORAL
  Filled 2022-01-09 (×8): qty 2

## 2022-01-09 MED ORDER — ALTEPLASE 2 MG IJ SOLR: 2.0000 mg | Freq: Once | INTRAMUSCULAR | Status: DC | PRN

## 2022-01-09 MED ORDER — IOHEXOL 300 MG/ML  SOLN
100.0000 mL | Freq: Once | INTRAMUSCULAR | Status: AC | PRN
  Administered 2022-01-09: 100 mL via INTRAVENOUS

## 2022-01-09 MED ORDER — INSULIN ASPART 100 UNIT/ML IJ SOLN
0.0000 [IU] | Freq: Every day | INTRAMUSCULAR | Status: DC
  Administered 2022-01-11: 2 [IU] via SUBCUTANEOUS

## 2022-01-09 MED ORDER — ALBUTEROL SULFATE (2.5 MG/3ML) 0.083% IN NEBU: 3.0000 mL | INHALATION_SOLUTION | Freq: Four times a day (QID) | RESPIRATORY_TRACT | Status: DC | PRN

## 2022-01-09 MED ORDER — ALLOPURINOL 100 MG PO TABS
100.0000 mg | ORAL_TABLET | Freq: Every day | ORAL | Status: DC
  Administered 2022-01-10 – 2022-01-16 (×7): 100 mg via ORAL
  Filled 2022-01-09 (×7): qty 1

## 2022-01-09 MED ORDER — FENTANYL CITRATE PF 50 MCG/ML IJ SOSY
25.0000 ug | PREFILLED_SYRINGE | INTRAMUSCULAR | Status: DC | PRN
  Administered 2022-01-12: 25 ug via INTRAVENOUS
  Filled 2022-01-09: qty 1

## 2022-01-09 MED ORDER — LIDOCAINE HCL (PF) 1 % IJ SOLN: 5.0000 mL | INTRAMUSCULAR | Status: DC | PRN

## 2022-01-09 MED ORDER — PENTAFLUOROPROP-TETRAFLUOROETH EX AERO: 1.0000 "application " | INHALATION_SPRAY | CUTANEOUS | Status: DC | PRN

## 2022-01-09 MED ORDER — LORATADINE 10 MG PO TABS
10.0000 mg | ORAL_TABLET | Freq: Every day | ORAL | Status: DC
  Administered 2022-01-10 – 2022-01-16 (×7): 10 mg via ORAL
  Filled 2022-01-09 (×7): qty 1

## 2022-01-09 MED ORDER — DOCUSATE SODIUM 100 MG PO CAPS
200.0000 mg | ORAL_CAPSULE | Freq: Every day | ORAL | Status: DC
  Administered 2022-01-09 – 2022-01-15 (×7): 200 mg via ORAL
  Filled 2022-01-09 (×7): qty 2

## 2022-01-09 MED ORDER — CHLORHEXIDINE GLUCONATE CLOTH 2 % EX PADS
6.0000 | MEDICATED_PAD | Freq: Every day | CUTANEOUS | Status: DC
  Administered 2022-01-10 – 2022-01-16 (×6): 6 via TOPICAL

## 2022-01-09 MED ORDER — FENTANYL CITRATE PF 50 MCG/ML IJ SOSY
50.0000 ug | PREFILLED_SYRINGE | Freq: Once | INTRAMUSCULAR | Status: AC
  Administered 2022-01-09: 50 ug via INTRAVENOUS
  Filled 2022-01-09: qty 1

## 2022-01-09 MED ORDER — ROPINIROLE HCL 1 MG PO TABS
4.0000 mg | ORAL_TABLET | Freq: Three times a day (TID) | ORAL | Status: DC
  Administered 2022-01-09 – 2022-01-16 (×18): 4 mg via ORAL
  Filled 2022-01-09 (×19): qty 4

## 2022-01-09 MED ORDER — SERTRALINE HCL 50 MG PO TABS
25.0000 mg | ORAL_TABLET | Freq: Every day | ORAL | Status: DC
  Administered 2022-01-10 – 2022-01-16 (×7): 25 mg via ORAL
  Filled 2022-01-09 (×7): qty 1

## 2022-01-09 MED ORDER — CARBIDOPA-LEVODOPA 25-100 MG PO TABS
2.0000 | ORAL_TABLET | Freq: Every day | ORAL | Status: DC
  Administered 2022-01-10 – 2022-01-16 (×7): 2 via ORAL
  Filled 2022-01-09 (×7): qty 2

## 2022-01-09 MED ORDER — PREDNISONE 5 MG PO TABS
2.5000 mg | ORAL_TABLET | Freq: Every day | ORAL | Status: DC
Start: 2022-01-10 — End: 2022-01-16
  Administered 2022-01-10 – 2022-01-16 (×7): 2.5 mg via ORAL
  Filled 2022-01-09 (×7): qty 1

## 2022-01-09 MED ORDER — MIDODRINE HCL 5 MG PO TABS
5.0000 mg | ORAL_TABLET | ORAL | Status: DC
  Administered 2022-01-10 – 2022-01-12 (×2): 5 mg via ORAL
  Filled 2022-01-09 (×3): qty 1

## 2022-01-09 MED ORDER — FLUTICASONE PROPIONATE 50 MCG/ACT NA SUSP
2.0000 | Freq: Every day | NASAL | Status: DC
  Administered 2022-01-10 – 2022-01-16 (×6): 2 via NASAL
  Filled 2022-01-09: qty 16

## 2022-01-09 MED ORDER — HEPARIN SODIUM (PORCINE) 1000 UNIT/ML DIALYSIS: 1000.0000 [IU] | INTRAMUSCULAR | Status: DC | PRN

## 2022-01-09 MED ORDER — SODIUM ZIRCONIUM CYCLOSILICATE 10 G PO PACK: 10.0000 g | PACK | Freq: Once | ORAL | Status: DC

## 2022-01-09 NOTE — ED Notes (Signed)
X-ray at bedside

## 2022-01-09 NOTE — ED Notes (Signed)
Repeat blue top obtained and sent to lab.  ?

## 2022-01-09 NOTE — ED Notes (Signed)
Called [facility] in regards to handoff, they stated their charge will call back.  ?

## 2022-01-09 NOTE — Progress Notes (Signed)
Called by Dr. Jeanell Sparrow about CT T spine result. ? ?Imaging reviewed, I do not see a definitive posterior element component of the fracture, however there is anterior osteophyte fracture extending into the vertebral body. There is no malalignment or retropulson. This pattern of fracture is concerning for instability in the setting of DISH.  ? ?Per EDMD, patient is at baseline nonambulatory and moved via hoyer lift. I do not recommend any surgical intervention at this time. I recommend pain control and could attempt TLSO for comfort if tolerated. ? ?MRI would help definitively show ligamentous injury and further classify stability therefore I will order a T spine MRI. ? ?I recommend spine precautions and log roll until MRI is completed. ? ?Please call with questions or concerns. ? ? ?Jean Bacigalupi, DO ?Neurosurgery ?[phone] ?

## 2022-01-09 NOTE — ED Notes (Signed)
Pt transports to MRI via stretcher.  ?

## 2022-01-09 NOTE — ED Provider Notes (Signed)
?[facility] DEPT ?Provider Note ? ? ?CSN: [id number] ?Arrival date & time: 01/09/22  1040 ? ?  ? ?History ? ?Chief Complaint  ?Patient presents with  ? Back Pain  ? Neck Pain  ? ? ?Jean Mcclain is a 76 y.o. female. ? ?HPI ?76 year old female presents today from assisted living facility with complaints of mid to low back pain.  She states that she was lifted and had a Hoyer lift about a week ago.  They had a pad that was too large and it caused her to curl up into a fetal position.  She began having back pain at that time.  She is continue to have back pain from the mid back to low back she has been given oxycodone at the facility without relief.  She is on Eliquis.  She denies any signs or symptoms of bleeding.  She has ongoing bilateral leg weakness.  This occurred around the time of a ankle fracture and she has been nonambulatory since that time.  She does not have any new numbness, tingling, or weakness.  She is end-stage renal disease on dialysis Monday Wednesday Friday.  She reports that she still urinates approximately 2 times a day and this is unchanged from her baseline.  She has gone to her last dialysis on Friday.  She also has a history of COPD and she is on oxygen at night.  She has not had any new respiratory symptoms, specifically no shortness of breath, cough, fever, chills.  She denies any headache head injury, neck pain, numbness, tingling, chest pain, abdominal pain, or urinary tract infection symptoms ? ?  ? ?Home Medications ?Prior to Admission medications   ?Medication Sig Start Date End Date Taking? Authorizing Provider  ?acetaminophen (TYLENOL) 500 MG tablet Take 1 tablet (500 mg total) by mouth every 6 (six) hours as needed for mild pain or headache. 06/26/20   Samuella Cota, MD  ?albuterol (VENTOLIN HFA) 108 (90 Base) MCG/ACT inhaler Inhale 2 puffs into the lungs every 6 (six) hours as needed for wheezing or shortness of breath.  03/26/20   [provider]  ?apixaban (ELIQUIS) 2.5 MG TABS tablet Take 2.5 mg by mouth 2 (two) times daily.    [provider]  ?B-D INS SYR ULTRAFINE 1CC/30G 30G X 1/2" 1 ML MISC 1 each by Other route daily.  12/23/19   [provider]  ?Blood Glucose Monitoring Suppl ([place]) w/Device KIT  03/23/20   [provider]  ?calcium acetate (PHOSLO) 667 MG tablet Take 667 mg by mouth in the morning, at noon, in the evening, and at bedtime.  11/28/19   [provider]  ?carbidopa-levodopa (SINEMET IR) 10-100 MG tablet Take 1 tablet by mouth 2 (two) times daily.    [provider]  ?colchicine 0.6 MG tablet Take 0.3 mg by mouth daily.    [provider]  ?docusate sodium (COLACE) 100 MG capsule Take 200 mg by mouth at bedtime.    [provider]  ?doxercalciferol (HECTOROL) 4 MCG/2ML injection Inject 1 mL (2 mcg total) into the vein every Monday, Wednesday, and Friday with hemodialysis. 01/27/20   [place] Donnamarie Poag, DO  ?febuxostat (ULORIC) 40 MG tablet Take 40 mg by mouth daily.    [provider]  ?gabapentin (NEURONTIN) 100 MG capsule Take 1 capsule (100 mg total) by mouth at bedtime. 06/26/20   Samuella Cota, MD  ?glucose blood test strip 1 each by Other  route daily.     [provider]  ?insulin NPH Human (NOVOLIN N) 100 UNIT/ML injection Inject 0.1 mLs (10 Units total) into the skin 2 (two) times daily at 8 am and 10 pm. ?Patient taking differently: Inject 10 Units into the skin 2 (two) times daily before a meal. 06/26/20   Samuella Cota, MD  ?Lancets (ONETOUCH DELICA PLUS [id number]) [place] 1 each by Other route daily.  03/05/19   [provider]  ?Multiple Vitamin (MULTIVITAMIN WITH MINERALS) TABS tablet Take 1 tablet by mouth daily.    [provider]  ?Nutritional Supplements (ADULT NUTRITIONAL SUPPLEMENT + PO) Take 1 tablet by mouth in the morning and at bedtime. Wound Healing    [provider]   ?omeprazole (PRILOSEC) 20 MG capsule Take 20 mg by mouth daily before breakfast.     [provider]  ?oxyCODONE (OXY IR/ROXICODONE) 5 MG immediate release tablet Take 1 tablet (5 mg total) by mouth every 6 (six) hours as needed for moderate pain or severe pain. 06/26/20   Samuella Cota, MD  ?oxyCODONE-acetaminophen (PERCOCET/ROXICET) 5-325 MG tablet Take 1 tablet by mouth every 4 (four) hours as needed. 09/29/20   Constant, Peggy, MD  ?OXYGEN Inhale 2 L into the lungs as needed (shortness of breath).    [provider]  ?rOPINIRole (REQUIP) 4 MG tablet Take 4 mg by mouth 2 (two) times daily. 05/27/20   [provider]  ?Zinc Oxide (TRIPLE PASTE) 12.8 % ointment Apply topically 4 (four) times daily. ?Patient taking differently: Apply 1 application topically 4 (four) times daily. 06/26/20   Samuella Cota, MD  ?   ? ?Allergies    ?Patient has no known allergies.   ? ?Review of Systems   ?Review of Systems  ?All other systems reviewed and are negative. ? ?Physical Exam ?Updated Vital Signs ?BP (!) 117/59   Pulse 79   Temp 98.5 ?F (36.9 ?C) (Rectal)   Resp 18   Ht 1.524 m (5')   Wt 90 kg   SpO2 94%   BMI 38.75 kg/m?  ?Physical Exam ?Vitals and nursing note reviewed.  ?Constitutional:   ?   General: She is not in acute distress. ?   Appearance: Normal appearance. She is normal weight. She is not ill-appearing.  ?HENT:  ?   Head: Normocephalic.  ?   Right Ear: External ear normal.  ?   Left Ear: External ear normal.  ?   Nose: Nose normal.  ?   Mouth/Throat:  ?   Mouth: Mucous membranes are dry.  ?   Pharynx: Oropharynx is clear.  ?Eyes:  ?   Pupils: Pupils are equal, round, and reactive to light.  ?Cardiovascular:  ?   Rate and Rhythm: Normal rate and regular rhythm.  ?   Pulses: Normal pulses.  ?Pulmonary:  ?   Effort: Pulmonary effort is normal.  ?   Breath sounds: Normal breath sounds.  ?Abdominal:  ?   General: Abdomen is flat.  ?   Palpations: Abdomen is soft.   ?Musculoskeletal:     ?   General: No swelling. Normal range of motion.  ?   Cervical back: Normal range of motion.  ?   Comments: Right upper extremity with access for dialysis ?No swelling or redness noted  ?Skin: ?   General: Skin is warm.  ?   Capillary Refill: Capillary refill takes less than 2 seconds.  ?Neurological:  ?   General: No focal deficit present.  ?  Mental Status: She is alert.  ?   Cranial Nerves: No cranial nerve deficit.  ?   Sensory: No sensory deficit.  ?   Motor: No weakness.  ?   Comments: Bilateral weakness with good strength in bilateral toes with dorsal and plantarflexion ?Pulses intact ?No redness, or cords noted ?Back visually examined with no obvious external signs of trauma with a little discoloration in the left lower back area that could be bruising ?No point tenderness was appreciated on my exam ?No step-offs noted  ?Psychiatric:     ?   Mood and Affect: Mood normal.     ?   Behavior: Behavior normal.  ? ? ?ED Results / Procedures / Treatments   ?Labs ?(all labs ordered are listed, but only abnormal results are displayed) ?Labs Reviewed  ?CBC - Abnormal; Notable for the following components:  ?    Result Value  ? WBC 16.3 (*)   ? Hemoglobin 11.9 (*)   ? All other components within normal limits  ?BASIC METABOLIC PANEL - Abnormal; Notable for the following components:  ? Potassium 5.3 (*)   ? BUN 59 (*)   ? Creatinine, Ser 6.16 (*)   ? GFR, Estimated 7 (*)   ? All other components within normal limits  ?RESP PANEL BY RT-PCR (FLU A&B, COVID) [id number]  ?URINALYSIS, ROUTINE W REFLEX MICROSCOPIC  ?PROTIME-INR  ? ? ?EKG ?None ? ?Radiology ?CT THORACIC SPINE WO CONTRAST ? ?Result Date: 01/09/2022 ?CLINICAL DATA:  Trauma EXAM: CT THORACIC AND LUMBAR SPINE WITHOUT CONTRAST TECHNIQUE: Multidetector CT imaging of the thoracic and lumbar spine was performed without contrast. Multiplanar CT image reconstructions were also generated. RADIATION DOSE REDUCTION: This exam was performed according to  the departmental dose-optimization program which includes automated exposure control, adjustment of the mA and/or kV according to patient size and/or use of iterative reconstruction technique. COMPARISON:  No prior dedicated thorac

## 2022-01-09 NOTE — H&P (Signed)
?History and Physical  ? ? ?Patient: Jean Mcclain JGG:[id number] DOB: 1945-10-19 ?DOA: 01/09/2022 ?DOS: the patient was seen and examined on 01/09/2022 ?PCP: Donnajean Lopes, MD  ?Patient coming from: ALF/ILF ? ?Chief Complaint:  ?Chief Complaint  ?Patient presents with  ? Back Pain  ? Neck Pain  ? ?HPI: Jean Mcclain is a 76 y.o. female with medical history significant of DM2, HLD, ESRD on HD, COPD on chronic nightly O2. Presenting with back pain. Patient is currently drowsy s/p pain meds. Per chart review, she was at her ALF being moved by a hoyer lift. She apparently was rolled up into a fetal position. She has had back pain for the last week. She has not been able to control it with outpt meds. She chronically has bilateral leg weakness and is essentially non-ambulatory. When her symptoms did not improve this morning, she was brought to the ED for evaluation.  ? ?Review of Systems: unable to review all systems due to the inability of the patient to answer questions. ?Past Medical History:  ?Diagnosis Date  ? Acute and chronic respiratory failure (acute-on-chronic) ([place])   ? Anxiety   ? Arthritis   ? osteo  ? Chronic kidney disease   ? Diabetes mellitus, type 2 ([place])   ? Type II  ? DVT (deep venous thrombosis) ([place]) 03/2017  ? left popliteal  ? Facet joint disease   ? Facet joint disease   ? foot  ? Fibula fracture 02/2017  ? left  ? GERD (gastroesophageal reflux disease)   ? H/O blood clots   ? Hyperlipidemia   ? Hypertension   ? Macrocytic anemia   ? Morbid obesity ([place])   ? OSA (obstructive sleep apnea)   ? does not use CPAP   ? Osteopenia   ? Renal disorder   ? MWF dialysis  ? Shortness of breath   ? with exertion  ? Supplemental oxygen dependent   ? Venous stasis ulcers ([facility])   ? Vitamin D deficiency   ? ?Past Surgical History:  ?Procedure Laterality Date  ? ANKLE FUSION Left 06/24/2020  ? Procedure: LEFT TIBIOCALCANEAL FUSION;  Surgeon: [name], MD;  Location: [facility];  Service: Orthopedics;   Laterality: Left;  ? AV FISTULA PLACEMENT Right 08/20/2018  ? Procedure: ARTERIOVENOUS (AV) FISTULA CREATION BRACHIOCEPHALIC;  Surgeon: Angelia Mould, MD;  Location: [facility];  Service: Vascular;  Laterality: Right;  ? BREAST SURGERY Right 03/1997  ? biospy  ? CARPAL TUNNEL RELEASE Left 2016  ? HYSTEROSCOPY WITH D & C N/A 09/29/2020  ? Procedure: DILATATION AND CURETTAGE /HYSTEROSCOPY;  Surgeon: Mora Bellman, MD;  Location: [facility];  Service: Gynecology;  Laterality: N/A;  ? INSERTION OF MESH N/A 01/08/2013  ? Procedure: INSERTION OF MESH;  Surgeon: Earnstine Regal, MD;  Location: [facility];  Service: General;  Laterality: N/A;  ? IR THORACENTESIS ASP PLEURAL SPACE W/IMG GUIDE  04/06/2017  ? JOINT REPLACEMENT Right 1999  ? TOTAL KNEE ARTHROPLASTY Left 1999  ? Left  ? TRIGGER FINGER RELEASE Left 11/2014  ? TRIGGER FINGER RELEASE Right   ? VENTRAL HERNIA REPAIR N/A 01/08/2013  ? Procedure: LAPAROSCOPIC VENTRAL HERNIA;  Surgeon: Earnstine Regal, MD;  Location: [facility];  Service: General;  Laterality: N/A;  ? ?Social History:  reports that she quit smoking about 40 years ago. Her smoking use included cigarettes. She has never used smokeless tobacco. She reports that she does not drink alcohol and does not use drugs. ? ?  No Known Allergies ? ?Family History  ?Problem Relation Age of Onset  ? Leukemia Mother   ? Hypertension Brother   ? Emphysema Father   ? ? ?Prior to Admission medications   ?Medication Sig Start Date End Date Taking? Authorizing Provider  ?acetaminophen (TYLENOL) 500 MG tablet Take 1 tablet (500 mg total) by mouth every 6 (six) hours as needed for mild pain or headache. 06/26/20   Samuella Cota, MD  ?albuterol (VENTOLIN HFA) 108 (90 Base) MCG/ACT inhaler Inhale 2 puffs into the lungs every 6 (six) hours as needed for wheezing or shortness of breath.  03/26/20   [provider]  ?apixaban (ELIQUIS) 2.5 MG TABS tablet Take 2.5 mg by mouth 2 (two) times daily.    [provider]  ?B-D INS  SYR ULTRAFINE 1CC/30G 30G X 1/2" 1 ML MISC 1 each by Other route daily.  12/23/19   [provider]  ?Blood Glucose Monitoring Suppl ([place]) w/Device KIT  03/23/20   [provider]  ?calcium acetate (PHOSLO) 667 MG tablet Take 667 mg by mouth in the morning, at noon, in the evening, and at bedtime.  11/28/19   [provider]  ?carbidopa-levodopa (SINEMET IR) 10-100 MG tablet Take 1 tablet by mouth 2 (two) times daily.    [provider]  ?colchicine 0.6 MG tablet Take 0.3 mg by mouth daily.    [provider]  ?docusate sodium (COLACE) 100 MG capsule Take 200 mg by mouth at bedtime.    [provider]  ?doxercalciferol (HECTOROL) 4 MCG/2ML injection Inject 1 mL (2 mcg total) into the vein every Monday, Wednesday, and Friday with hemodialysis. 01/27/20   [place] Donnamarie Poag, DO  ?febuxostat (ULORIC) 40 MG tablet Take 40 mg by mouth daily.    [provider]  ?gabapentin (NEURONTIN) 100 MG capsule Take 1 capsule (100 mg total) by mouth at bedtime. 06/26/20   Samuella Cota, MD  ?glucose blood test strip 1 each by Other route daily.     [provider]  ?insulin NPH Human (NOVOLIN N) 100 UNIT/ML injection Inject 0.1 mLs (10 Units total) into the skin 2 (two) times daily at 8 am and 10 pm. ?Patient taking differently: Inject 10 Units into the skin 2 (two) times daily before a meal. 06/26/20   Samuella Cota, MD  ?Lancets (ONETOUCH DELICA PLUS [id number]) [place] 1 each by Other route daily.  03/05/19   [provider]  ?Multiple Vitamin (MULTIVITAMIN WITH MINERALS) TABS tablet Take 1 tablet by mouth daily.    [provider]  ?Nutritional Supplements (ADULT NUTRITIONAL SUPPLEMENT + PO) Take 1 tablet by mouth in the morning and at bedtime. Wound Healing    [provider]  ?omeprazole (PRILOSEC) 20 MG capsule Take 20 mg by mouth daily before breakfast.     [provider]  ?oxyCODONE (OXY  IR/ROXICODONE) 5 MG immediate release tablet Take 1 tablet (5 mg total) by mouth every 6 (six) hours as needed for moderate pain or severe pain. 06/26/20   Samuella Cota, MD  ?oxyCODONE-acetaminophen (PERCOCET/ROXICET) 5-325 MG tablet Take 1 tablet by mouth every 4 (four) hours as needed. 09/29/20   Constant, Peggy, MD  ?OXYGEN Inhale 2 L into the lungs as needed (shortness of breath).    [provider]  ?rOPINIRole (REQUIP) 4 MG tablet Take 4 mg by mouth 2 (two) times daily. 05/27/20   [provider]  ?Zinc Oxide (TRIPLE PASTE) 12.8 %  ointment Apply topically 4 (four) times daily. ?Patient taking differently: Apply 1 application topically 4 (four) times daily. 06/26/20   Samuella Cota, MD  ? ? ?Physical Exam: ?Vitals:  ? 01/09/22 1254 01/09/22 1255 01/09/22 1354 01/09/22 1400  ?BP:    (!) 117/59  ?Pulse:  85 80 79  ?Resp:  '19 16 18  ' ?Temp: 98.5 ?F (36.9 ?C)     ?TempSrc: Rectal     ?SpO2:  93% 95% 94%  ?Weight:      ?Height:      ? ?General: 76 y.o. female resting in bed in NAD ?Eyes: PERRL, normal sclera ?ENMT: Nares patent w/o discharge, orophaynx clear, dentition normal, ears w/o discharge/lesions/ulcers ?Neck: Supple, trachea midline ?Cardiovascular: RRR, +S1, S2, no m/g/r, equal pulses throughout ?Respiratory: CTABL, no w/r/r, normal WOB ?GI: BS+, NDNT, no masses noted, no organomegaly noted ?MSK: No e/c/c ?Skin: No rashes, bruises, ulcerations noted ?Neuro: A&O x name, not participating in neuro exam, drowsy ? ?Data Reviewed: ? ?K+  5.3 ?CO2  29 ?BUN  59 ?SCr  6.16 ?WBC  16.3 ? ?CT THORACIC SPINE IMPRESSION ?1. Lucency through the osteophytes at the inferior aspect of T8 ?extending into the inferior aspect of the vertebral body, concerning ?for an atypical fracture in the setting of DISH. ?2. No other acute fracture or significant listhesis. ?3. Scoliosis. ?  ?CT LUMBAR SPINE IMPRESSION ?No acute fracture or traumatic listhesis in the spine. ? ?CT ab/pelvis ?There is no evidence of  intestinal obstruction or pneumoperitoneum. ?There is no hydronephrosis. Appendix is not dilated. ?There is marked cortical thinning in the both kidneys which may be due to medical renal disease or renal artery stenosi

## 2022-01-09 NOTE — ED Triage Notes (Signed)
Arrives via EMS from [facility], report neck and back pain after was lifted with the hoyer into the bed. Per EMS no trauma, falls, pain w/ movement and rotation. Patient reports the hoyer pad went over her head, now report mid-back pain.  ?

## 2022-01-09 NOTE — ED Notes (Signed)
Patient transported to CT via x-ray.  ?

## 2022-01-09 NOTE — ED Notes (Signed)
Patient arrived back from MRI. [facility] called for transport to [facility].  ?

## 2022-01-09 NOTE — Progress Notes (Signed)
I was notified that this patient is going to be transferred to [facility] for admission due to a T8 fracture for pain control ? ?She is a MWF dialysis patient at [facility] ? ?4 hours and 15 minutes/180 dialyzer/3K/2 calc/AVF with 400 BFR 15 gauge needles/ no heparin / EDW 81.1 ? ?Iron 100 every tx, calcitriol  0.25 q treatment ? ?on 3/29 K was 4.5 and hgb 11.4 ? ?Today K is 5.3-  will give one dose of lokelma  ? ?I will put in dialysis orders for tomorrow-  full consult will be done tomorrow as well   ? ?Jean Mcclain ? ? ? ?

## 2022-01-10 DIAGNOSIS — Z515 Encounter for palliative care: Secondary | ICD-10-CM

## 2022-01-10 DIAGNOSIS — S22068A Other fracture of T7-T8 thoracic vertebra, initial encounter for closed fracture: Secondary | ICD-10-CM | POA: Diagnosis not present

## 2022-01-10 LAB — COMPREHENSIVE METABOLIC PANEL
ALT: <5 U/L (ref 0–44)
AST: 9 U/L — ABNORMAL LOW (ref 15–41)
Albumin: 2.9 g/dL — ABNORMAL LOW (ref 3.5–5.0)
Alkaline Phosphatase: 115 U/L (ref 38–126)
Anion gap: 11 (ref 5–15)
BUN: 67 mg/dL — ABNORMAL HIGH (ref 8–23)
CO2: 26 mmol/L (ref 22–32)
Calcium: 8.5 mg/dL — ABNORMAL LOW (ref 8.9–10.3)
Chloride: 98 mmol/L (ref 98–111)
Creatinine, Ser: 7.14 mg/dL — ABNORMAL HIGH (ref 0.44–1.00)
GFR, Estimated: 6 mL/min — ABNORMAL LOW (ref >60)
Glucose, Bld: 122 mg/dL — ABNORMAL HIGH (ref 70–99)
Potassium: 6.2 mmol/L — ABNORMAL HIGH (ref 3.5–5.1)
Sodium: 135 mmol/L (ref 135–145)
Total Bilirubin: 0.7 mg/dL (ref 0.3–1.2)
Total Protein: 6.2 g/dL — ABNORMAL LOW (ref 6.5–8.1)

## 2022-01-10 LAB — CBC
HCT: 36 % (ref 36.0–46.0)
Hemoglobin: 11.1 g/dL — ABNORMAL LOW (ref 12.0–15.0)
MCH: 29.4 pg (ref 26.0–34.0)
MCHC: 30.8 g/dL (ref 30.0–36.0)
MCV: 95.5 fL (ref 80.0–100.0)
Platelets: 228 10*3/uL (ref 150–400)
RBC: 3.77 MIL/uL — ABNORMAL LOW (ref 3.87–5.11)
RDW: 15.2 % (ref 11.5–15.5)
WBC: 14.1 10*3/uL — ABNORMAL HIGH (ref 4.0–10.5)
nRBC: 0 % (ref 0.0–0.2)

## 2022-01-10 LAB — GLUCOSE, CAPILLARY
Glucose-Capillary: 131 mg/dL — ABNORMAL HIGH (ref 70–99)
Glucose-Capillary: 132 mg/dL — ABNORMAL HIGH (ref 70–99)
Glucose-Capillary: 96 mg/dL (ref 70–99)
Glucose-Capillary: 78 mg/dL (ref 70–99)

## 2022-01-10 NOTE — Consult Note (Signed)
? ?  Providing Compassionate, Quality Care - Together ? ?Neurosurgery Consult  ?Referring physician: ED MD ?Reason for referral: T8 fracture ? ?Chief Complaint: Back pain ? ?History of Present Illness: This is a 76 year old female with a history of diabetes, ESRD, COPD, that complains of severe back pain after being mobilized via a Hoyer lift at her assisted living facility.  She has had back pain for last week, unable to control with medications orally.  She states she has been nonambulatory for 2 years, has been using a [employer] fast streamer in her assisted living facility.  She does not complain of any numbness or tingling in her legs, but does have chronic deconditioning.  Patient denies any groin numbness/saddle anesthesia, no bowel or bladder changes. ? ?CT thoracic spine in the emergency department revealed a T8 anterior inferior vertebral body fracture through an osteophyte.  Patient has findings consistent with DISH.  There is no definitive fracture line appreciated through posterior elements. ? ?Medications: I have reviewed the patient's current medications. ?Allergies: No Known Allergies ? ?History reviewed. No pertinent family history. ?Social History:  has no history on file for tobacco use, alcohol use, and drug use. ? ?ROS: ?All pertinent positives and negatives are listed in HPI above. ? ?Physical Exam:  ?Vital signs in last 24 hours: ?Temp:  [98 ?F (36.7 ?C)-98.3 ?F (36.8 ?C)] 98 ?F (36.7 ?C) (07/25 1814) ?Pulse Rate:  [58-128] 65 (07/26 0746) ?Resp:  [11-18] 14 (07/26 0217) ?BP: (138-182)/(65-125) 153/88 (07/26 0700) ?SpO2:  [91 %-98 %] 96 % (07/26 0746) ?PE: ?Awake alert, moderate distress due to pain ?PERRLA ?EOMI ?Bilateral upper extremity 4+/5 throughout ?Bilateral lower extremity 3-4/5 throughout ?Reflexes bilateral lower extremity 2/4 ?Severe tenderness palpation in the thoracic spine ? ? ?Impression/Assessment:  ?76 year old female with ? ?T8 anterior vertebral body fracture ? ?Plan:  ?-MRI and CT  reviewed.  MRI reveals there is no middle column/posterior column involvement on STIR imaging.  There is no spinal stenosis associated with the fracture whatsoever.  Typically in the setting of DISH this could be a slightly unstable fracture however the patient is nonambulatory whatsoever.  She does have significant pain therefore I will order a TLSO brace and assistance for pain control. ?-I do not recommend any surgical intervention at this time. ? ? ?Thank you for allowing me to participate in this patient's care.  Please do not hesitate to call with questions or concerns. ? ? ?Jamyrah Saur, DO ?Neurosurgeon ?[facility] ?Cell: [phone] ? ? ? ? ?

## 2022-01-10 NOTE — Progress Notes (Signed)
? ?  Palliative Medicine Inpatient Follow Up Note ? ?Palliative Care has acknowledged the consult for Jean Mcclain. She is at HD presently therefore has not been seen.  ? ?Ongoing coordination will take place tomorrow to complete her initial consultation for "Pain and Symptom management." Additionally, goals of care will be broached as a  topic given her chronic disease burden.  ? ?No Charge ?______________________________________________________________________________________ ?Jean Mcclain ?[facility] Team ?Team Cell Phone: [phone] ?Please utilize secure chat with additional questions, if there is no response within 30 minutes please call the above phone number ? ?Palliative Medicine Team providers are available by phone from 7am to 7pm daily and can be reached through the team cell phone.  ?Should this patient require assistance outside of these hours, please call the patient's attending physician. ? ? ? ? ?

## 2022-01-10 NOTE — Consult Note (Addendum)
[facility] ?Renal Consultation Note  ?  ?Indication for Consultation:  Management of ESRD/hemodialysis; anemia, hypertension/volume and secondary hyperparathyroidism ? ?YTK:[id number], Ermalene Searing, MD ? ?HPI: JEANINE CAVEN is a 76 y.o. female with ESRD on HD MWF at [facility]. Her past medical history significant for DM2, HLD, and COPD on chronic nightly O2.  ? ?Patient presents to the ED for ongoing back pain. Patient has chronically lower extremity weakness and immobile at baseline. She residents in an ALF. CT thoracic spine (+) T8 fracture. Patient transferred to [facility] for further evaluation and pain management. Neurosurgery following. She received full HD on 01/07/22. Seen and examined patient at bedside. C/o ongoing back pain. On 2L O2 (wears chronically at bedtime). Denies SOB, CP, and N/V. Notable labs include K+ 6.2, Ca 8.5, Albumin 2.9, Hgb 11.1. CXR unremarkable. Plan for HD today. ? ?Past Medical History:  ?Diagnosis Date  ? Acute and chronic respiratory failure (acute-on-chronic) ([place])   ? Anxiety   ? Arthritis   ? osteo  ? Chronic kidney disease   ? Diabetes mellitus, type 2 ([place])   ? Type II  ? DVT (deep venous thrombosis) ([place]) 03/2017  ? left popliteal  ? Facet joint disease   ? Facet joint disease   ? foot  ? Fibula fracture 02/2017  ? left  ? GERD (gastroesophageal reflux disease)   ? H/O blood clots   ? Hyperlipidemia   ? Hypertension   ? Macrocytic anemia   ? Morbid obesity ([place])   ? OSA (obstructive sleep apnea)   ? does not use CPAP   ? Osteopenia   ? Renal disorder   ? MWF dialysis  ? Shortness of breath   ? with exertion  ? Supplemental oxygen dependent   ? Venous stasis ulcers ([facility])   ? Vitamin D deficiency   ? ?Past Surgical History:  ?Procedure Laterality Date  ? ANKLE FUSION Left 06/24/2020  ? Procedure: LEFT TIBIOCALCANEAL FUSION;  Surgeon: [name], MD;  Location: [place];  Service: Orthopedics;  Laterality: Left;  ? AV FISTULA PLACEMENT Right 08/20/2018  ?  Procedure: ARTERIOVENOUS (AV) FISTULA CREATION BRACHIOCEPHALIC;  Surgeon: Angelia Mould, MD;  Location: [place];  Service: Vascular;  Laterality: Right;  ? BREAST SURGERY Right 03/1997  ? biospy  ? CARPAL TUNNEL RELEASE Left 2016  ? HYSTEROSCOPY WITH D & C N/A 09/29/2020  ? Procedure: DILATATION AND CURETTAGE /HYSTEROSCOPY;  Surgeon: Mora Bellman, MD;  Location: [facility];  Service: Gynecology;  Laterality: N/A;  ? INSERTION OF MESH N/A 01/08/2013  ? Procedure: INSERTION OF MESH;  Surgeon: Earnstine Regal, MD;  Location: [facility];  Service: General;  Laterality: N/A;  ? IR THORACENTESIS ASP PLEURAL SPACE W/IMG GUIDE  04/06/2017  ? JOINT REPLACEMENT Right 1999  ? TOTAL KNEE ARTHROPLASTY Left 1999  ? Left  ? TRIGGER FINGER RELEASE Left 11/2014  ? TRIGGER FINGER RELEASE Right   ? VENTRAL HERNIA REPAIR N/A 01/08/2013  ? Procedure: LAPAROSCOPIC VENTRAL HERNIA;  Surgeon: Earnstine Regal, MD;  Location: [facility];  Service: General;  Laterality: N/A;  ? ?Family History  ?Problem Relation Age of Onset  ? Leukemia Mother   ? Hypertension Brother   ? Emphysema Father   ? ?Social History: ? reports that she quit smoking about 40 years ago. Her smoking use included cigarettes. She has never used smokeless tobacco. She reports that she does not drink alcohol and does not use drugs. ?No Known Allergies ?Prior to  Admission medications   ?Medication Sig Start Date End Date Taking? Authorizing Provider  ?acetaminophen (TYLENOL) 500 MG tablet Take 1 tablet (500 mg total) by mouth every 6 (six) hours as needed for mild pain or headache. 06/26/20  Yes Samuella Cota, MD  ?albuterol (VENTOLIN HFA) 108 (90 Base) MCG/ACT inhaler Inhale 2 puffs into the lungs every 6 (six) hours as needed for wheezing or shortness of breath.  03/26/20  Yes [provider]  ?allopurinol (ZYLOPRIM) 100 MG tablet Take 100 mg by mouth daily. 08/24/21  Yes [provider]  ?apixaban (ELIQUIS) 2.5 MG TABS tablet Take 2.5 mg by mouth 2 (two) times  daily.   Yes [provider]  ?atorvastatin (LIPITOR) 20 MG tablet Take 20 mg by mouth daily. 08/24/21  Yes [provider]  ?calcium acetate (PHOSLO) 667 MG tablet Take 667 mg by mouth in the morning, at noon, in the evening, and at bedtime.  11/28/19  Yes [provider]  ?carbidopa-levodopa (SINEMET IR) 25-100 MG tablet Take 2 tablets by mouth daily.   Yes [provider]  ?docusate sodium (COLACE) 100 MG capsule Take 200 mg by mouth at bedtime.   Yes [provider]  ?fluticasone (FLONASE) 50 MCG/ACT nasal spray Place 2 sprays into both nostrils daily.   Yes [provider]  ?gabapentin (NEURONTIN) 300 MG capsule Take 300 mg by mouth 3 (three) times daily. 08/24/21  Yes [provider]  ?hydrOXYzine (ATARAX) 50 MG tablet Take 50 mg by mouth 3 (three) times daily. HOLD WHEN OUT OF COMMUNITY FOR DIALYSIS TX ON Monday, Wednesday & FRIDAY 08/24/21  Yes [provider]  ?insulin aspart (NOVOLOG FLEXPEN) 100 UNIT/ML FlexPen Inject 3-9 Units into the skin 3 (three) times daily before meals. Sliding scale: CBG = 70-100 = 3 units, 101-150=4, 151-200=5 units,201-250= 6 units, 251-300=7 units,301-350= 8 units ,351-400 = 9 units, 400 call MD 02/16/21  Yes [provider]  ?insulin NPH Human (NOVOLIN N) 100 UNIT/ML injection Inject 0.1 mLs (10 Units total) into the skin 2 (two) times daily at 8 am and 10 pm. ?Patient taking differently: Inject 12-32 Units into the skin See admin instructions. Taking 32 units in the Am and 12 units at bedtime 06/26/20  Yes Samuella Cota, MD  ?loratadine (CLARITIN) 10 MG tablet Take 10 mg by mouth daily.   Yes [provider]  ?midodrine (PROAMATINE) 10 MG tablet Take 10 mg by mouth See admin instructions. Give 10 mg on Mon, Wed, Fri prior to Dialysis treatment and 1 tablet half way through treatment 08/24/21  Yes [provider]  ?Multiple Vitamin (MULTIVITAMIN WITH MINERALS) TABS tablet Take  1 tablet by mouth daily.   Yes [provider]  ?omeprazole (PRILOSEC) 20 MG capsule Take 20 mg by mouth daily before breakfast.    Yes [provider]  ?oxyCODONE (OXY IR/ROXICODONE) 5 MG immediate release tablet Take 1 tablet (5 mg total) by mouth every 6 (six) hours as needed for moderate pain or severe pain. 06/26/20  Yes Samuella Cota, MD  ?OXYGEN Inhale 2 L into the lungs as needed (shortness of breath).   Yes [provider]  ?polyethylene glycol (MIRALAX / GLYCOLAX) 17 g packet Take 17 g by mouth daily as needed for mild constipation.   Yes [provider]  ?predniSONE (DELTASONE) 2.5 MG tablet Take 2.5 mg by mouth daily. 12/22/21  Yes [provider]  ?rOPINIRole (REQUIP) 4 MG tablet Take 4 mg by mouth in the  morning, at noon, and at bedtime. HOLD ON DIALYSIS TX DAYS : MON, WED, FRIDAY 05/27/20  Yes [provider]  ?sertraline (ZOLOFT) 25 MG tablet Take 25 mg by mouth daily.   Yes [provider]  ?simethicone (MYLICON) 846 MG chewable tablet Chew 125 mg by mouth every 12 (twelve) hours as needed for flatulence.   Yes [provider]  ?B-D INS SYR ULTRAFINE 1CC/30G 30G X 1/2" 1 ML MISC 1 each by Other route daily.  12/23/19   [provider]  ?Blood Glucose Monitoring Suppl ([place]) w/Device KIT  03/23/20   [provider]  ?doxercalciferol (HECTOROL) 4 MCG/2ML injection Inject 1 mL (2 mcg total) into the vein every Monday, Wednesday, and Friday with hemodialysis. 01/27/20   [place] Donnamarie Poag, DO  ?glucose blood test strip 1 each by Other route daily.     [provider]  ?Lancets [facility] DELICA PLUS [id number]) [place] 1 each by Other route daily.  03/05/19   [provider]  ?Nutritional Supplements (ADULT NUTRITIONAL SUPPLEMENT + PO) Take 1 tablet by mouth in the morning and at bedtime. Wound Healing    [provider]  ? ?Current Facility-Administered Medications   ?Medication Dose Route Frequency Provider Last Rate Last Admin  ? 0.9 %  sodium chloride infusion  100 mL Intravenous PRN Corliss Parish, MD      ? 0.9 %  sodium chloride infusion  100 mL Intravenous PRN [place]

## 2022-01-10 NOTE — TOC Progression Note (Signed)
Transition of Care (TOC) - Progression Note  ? ? ?Patient Details  ?Name: Jean Mcclain ?MRN: [id number] ?Date of Birth: [birth year] ? ?Transition of Care (TOC) CM/SW Contact  ?Angelita Ingles, RN ?Phone Number:[phone] ? ?01/10/2022, 10:17 AM ? ?Clinical Narrative:    ? ?Transition of Care (TOC) Screening Note ? ? ?Patient Details  ?Name: Jean Mcclain ?Date of Birth: July 08, 1946 ? ? ?Transition of Care (TOC) CM/SW Contact:    ?Angelita Ingles, RN ?Phone Number:[phone] ? ?01/10/2022, 10:17 AM ? ? ? ?Transition of Care Department [facility]) has reviewed patient and no TOC needs have been identified at this time. We will continue to monitor patient advancement through interdisciplinary progression rounds. If new patient transition needs arise, please place a TOC consult. ? ? ? ? ?  ?  ? ?Expected Discharge Plan and Services ?  ?  ?  ?  ?  ?                ?  ?  ?  ?  ?  ?  ?  ?  ?  ?  ? ? ?Social Determinants of Health (SDOH) Interventions ?  ? ?Readmission Risk Interventions ? ?  01/25/2020  ?  8:31 AM  ?Readmission Risk Prevention Plan  ?Transportation Screening Complete  ?PCP or Specialist Appt within 5-7 Days Not Complete  ?Not Complete comments d/c to SNF  ?Home Care Screening Complete  ?Medication Review (RN CM) Referral to Pharmacy  ? ? ?

## 2022-01-10 NOTE — Progress Notes (Signed)
Orthopedic Tech Progress Note ?Patient Details:  ?[name] ?04/14/1946 ?[id number] ? ?Spoke with [facility] about brace for patient, he is coming out to help with brace ? ?Patient ID: Jean Mcclain, female   DOB: 1945/12/06, 76 y.o.   MRN: [id number] ? ?Janit Pagan ?01/10/2022, 9:25 AM ? ?

## 2022-01-10 NOTE — Progress Notes (Signed)
Orthopedic Tech Progress Note ?Patient Details:  ?[place] ?29-Apr-1946 ?[id number] ? ?Called in order to [facility] for a TLSO BRACE  ? ?Patient ID: [name], female   DOB: 04-05-1946, 76 y.o.   MRN: [id number] ? ?Jean Mcclain ?01/10/2022, 9:58 AM ? ?

## 2022-01-10 NOTE — Progress Notes (Signed)
?PROGRESS NOTE ? ?[place]  ?DOB: Jan 11, 1946  ?PCP: Donnajean Lopes, MD ?YQM:[id number]  ?DOA: 01/09/2022 ? LOS: 1 day  ?Hospital Day: 2 ? ?Brief narrative: ?Jean Mcclain is a 76 y.o. female with PMH significant for ESRD-HD-MWF, DM2, HTN, HLD, obesity, OSA, COPD on chronic nightly O2, vitamin D deficiency, venous stasis ulcer ?Patient presented to the ED on 4/2 with complaint of back pain. ?Per history, patient has chronic bilateral leg weakness, essentially nonambulatory and gets moved by a [employer] lift.  1 week ago, while being moved with a [employer] lift, she apparently was rolled up into a fetal position after which she started having back pain not controlled with outpatient meds and is brought to the ED. ? ?CT thoracic spine showed lucency through the osteophytes at the inferior aspect of T8 ?extending into the inferior aspect of the vertebral body, concerning for an atypical fracture in the setting of DISH. ?CT lumbar spine did not show any acute fracture or traumatic listhesis in the spine. ?Admitted to hospitalist service ?Neurosurgery consulted  ?MRI spine was obtained which showed an acute nondisplaced fracture through the inferior endplate and overlying osteophyte at T8 with mild overlying prevertebral soft tissue edema, diffuse idiopathic skeletal hyperostosis. ? ?Subjective: ?Patient was seen and examined this morning. ?Pleasant elderly Caucasian female.  Drowsy at the time of my evaluation.  Pain controlled. ? ?Principal Problem: ?  Thoracic spine fracture ([place]) ?Active Problems: ?  Insulin-requiring or dependent type II diabetes mellitus ([place]) ?  Essential hypertension ?  Kidney lesion ?  OSA (obstructive sleep apnea) ?  ESRD on hemodialysis ([place]) ?  Intractable pain ?  GERD (gastroesophageal reflux disease) ?  HLD (hyperlipidemia) ?  Hyperkalemia ?  Anxiety ?  History of DVT (deep vein thrombosis) ?  Gout ?  ?Assessment and Plan: ?Acute nondisplaced T8 Fracture ?Intractable back pain ?Chronic  immobility ?-CT scan and MRI finding as above.  ?-No surgical intervention recommended by neurosurgery.  ?-Currently on pain meds.  ?-Palliative care consulted ?  ?ESRD on HD ?Hyperkalemia/hyperphosphatemia ?-Potassium level high at 6.2 today.  Expect improvement with dialysis. ?-Euvolemic.  On midodrine on the day of dialysis ?Recent Labs  ?Lab 01/09/22 ?1141 01/09/22 ?1739 01/10/22 ?5284  ?K 5.3* 5.7* 6.2*  ?PHOS  --  5.8*  --   ? ?Type 2 diabetes mellitus ?-A1c 7.2 on 01/09/2022 ?-Home meds include Novolin twice daily 32 units and 12 units ?-Currently on sliding scale insulin and Accu-Cheks. ?-Blood sugar level ?Recent Labs  ?Lab 01/09/22 ?1635 01/09/22 ?2116 01/10/22 ?0721 01/10/22 ?1140  ?GLUCAP 79 166* 131* 132*  ? ?COPD on nocturnal O2 supplementation ?-continue home regimen ?  ?Hx of DVT ?-continue eliquis ?  ?GERD ?-protonix ?  ?HLD ?-continue statin ?   ?Parkinson's disease ?Restless leg syndrome ?Anxiety ?-continue Sinemet, Neurontin, hydroxyzine, Zoloft, Requip ?  ?Gout ?-Allopurinol, ?  ?Right kidney lesion ?-as described on CT; consider renal US while she is inpt; let's get her back pain under control first ? ?Goals of care ?  Code Status: Partial Code  ? ? ?Mobility: Chronically immobile ? ?Nutritional status:  ?Body mass index is 37.5 kg/m?.  ?  ?  ? ? ? ? ?Diet:  ?Diet Order   ? ?       ?  Diet renal with fluid restriction Fluid restriction: 1200 mL Fluid; Room service appropriate? Yes; Fluid consistency: Thin  Diet effective now       ?  ? ?  ?  ? ?  ? ? ?  DVT prophylaxis:  ?apixaban (ELIQUIS) tablet 2.5 mg Start: 01/09/22 2200 ?apixaban (ELIQUIS) tablet 2.5 mg   ?Antimicrobials: None ?Fluid: None ?Consultants: Nephrology, neurosurgery, palliative care ?Family Communication: None at bedside ? ?Status is: Inpatient ? ?Continue in-hospital care because: Pending work-up ?Level of care: Telemetry Medical  ? ?Dispo: The patient is from: ALF ?             Anticipated d/c is to: Pending clinical course ?              Patient currently is not medically stable to d/c. ?  Difficult to place patient No ? ? ? ? ?Infusions:  ? sodium chloride    ? sodium chloride    ? ? ?Scheduled Meds: ? allopurinol  100 mg Oral Daily  ? apixaban  2.5 mg Oral BID  ? atorvastatin  20 mg Oral Daily  ? calcium acetate  667 mg Oral TID WC  ? carbidopa-levodopa  2 tablet Oral Daily  ? Chlorhexidine Gluconate Cloth  6 each Topical Q0600  ? docusate sodium  200 mg Oral QHS  ? doxercalciferol  2 mcg Intravenous Q M,W,F-HD  ? fluticasone  2 spray Each Nare Daily  ? gabapentin  300 mg Oral TID  ? insulin aspart  0-15 Units Subcutaneous TID WC  ? insulin aspart  0-5 Units Subcutaneous QHS  ? loratadine  10 mg Oral Daily  ? midodrine  10 mg Oral Once per day on Mon Wed Fri  ? And  ? midodrine  5 mg Oral Once per day on Mon Wed Fri  ? predniSONE  2.5 mg Oral Daily  ? rOPINIRole  4 mg Oral TID  ? sertraline  25 mg Oral Daily  ? sodium zirconium cyclosilicate  10 g Oral Once  ? ? ?PRN meds: ?sodium chloride, sodium chloride, albuterol, alteplase, fentaNYL (SUBLIMAZE) injection, heparin, lidocaine (PF), lidocaine-prilocaine, oxyCODONE, pentafluoroprop-tetrafluoroeth  ? ?Antimicrobials: ?Anti-infectives (From admission, onward)  ? ? None  ? ?  ? ? ?Objective: ?Vitals:  ? 01/10/22 1330 01/10/22 1400  ?BP: (!) 92/57 (!) 84/38  ?Pulse: 83 (!) 58  ?Resp:  (!) 24  ?Temp:    ?SpO2:    ? ?No intake or output data in the 24 hours ending 01/10/22 1418 ?Filed Weights  ? 01/09/22 1050 01/10/22 1228  ?Weight: 90 kg 87.1 kg  ? ?Weight change:  ?Body mass index is 37.5 kg/m?.  ? ?Physical Exam: ?General exam: Pleasant, elderly Caucasian female.  Pain controlled at rest ?Skin: No rashes, lesions or ulcers. ?HEENT: Atraumatic, normocephalic, no obvious bleeding ?Lungs: Clear to auscultate bilaterally ?CVS: Regular rate and rhythm, no murmur ?GI/Abd soft, nontender, nondistended, positive ?CNS: Opens eyes on verbal command, muffled speech, oriented to place and  person ?Psychiatry: Sad affect ?Extremities: No pedal edema, no calf tenderness ? ?Data Review: I have personally reviewed the laboratory data and studies available. ? ?F/u labs ordered ?Unresulted Labs (From admission, onward)  ? ?  Start     Ordered  ? 01/11/22 0500  CBC with Differential/Platelet  Tomorrow morning,   R       ?Question:  Specimen collection method  Answer:  Lab=Lab collect  ? 01/10/22 1418  ? 01/11/22 0865  Basic metabolic panel  Tomorrow morning,   R       ?Question:  Specimen collection method  Answer:  Lab=Lab collect  ? 01/10/22 1418  ? 01/09/22 1705  Hepatitis B surface antibody,quantitative  (New Admission Hemo Labs (Hepatitis B))  Once,  R       ? 01/09/22 1704  ? 01/09/22 1108  Urinalysis, Routine w reflex microscopic Urine, Clean Catch  Once,   R       ? 01/09/22 1107  ? ?  ?  ? ?  ? ? ?Signed, ?Terrilee Croak, MD ?[facility] ?01/10/2022 ? ? ? ? ? ? ? ? ? ? ? ? ?

## 2022-01-11 DIAGNOSIS — Z515 Encounter for palliative care: Secondary | ICD-10-CM | POA: Diagnosis not present

## 2022-01-11 DIAGNOSIS — Z66 Do not resuscitate: Secondary | ICD-10-CM

## 2022-01-11 DIAGNOSIS — N186 End stage renal disease: Secondary | ICD-10-CM

## 2022-01-11 DIAGNOSIS — Z7189 Other specified counseling: Secondary | ICD-10-CM

## 2022-01-11 DIAGNOSIS — Z992 Dependence on renal dialysis: Secondary | ICD-10-CM

## 2022-01-11 DIAGNOSIS — S22068A Other fracture of T7-T8 thoracic vertebra, initial encounter for closed fracture: Secondary | ICD-10-CM | POA: Diagnosis not present

## 2022-01-11 LAB — CBC WITH DIFFERENTIAL/PLATELET
Abs Immature Granulocytes: 0.11 10*3/uL — ABNORMAL HIGH (ref 0.00–0.07)
Basophils Absolute: 0 10*3/uL (ref 0.0–0.1)
Basophils Relative: 0 %
Eosinophils Absolute: 0.2 10*3/uL (ref 0.0–0.5)
Eosinophils Relative: 1 %
HCT: 35.4 % — ABNORMAL LOW (ref 36.0–46.0)
Hemoglobin: 10.9 g/dL — ABNORMAL LOW (ref 12.0–15.0)
Immature Granulocytes: 1 %
Lymphocytes Relative: 8 %
Lymphs Abs: 1.1 10*3/uL (ref 0.7–4.0)
MCH: 29.7 pg (ref 26.0–34.0)
MCHC: 30.8 g/dL (ref 30.0–36.0)
MCV: 96.5 fL (ref 80.0–100.0)
Monocytes Absolute: 1 10*3/uL (ref 0.1–1.0)
Monocytes Relative: 7 %
Neutro Abs: 11.6 10*3/uL — ABNORMAL HIGH (ref 1.7–7.7)
Neutrophils Relative %: 83 %
Platelets: 227 10*3/uL (ref 150–400)
RBC: 3.67 MIL/uL — ABNORMAL LOW (ref 3.87–5.11)
RDW: 15 % (ref 11.5–15.5)
WBC: 14 10*3/uL — ABNORMAL HIGH (ref 4.0–10.5)
nRBC: 0 % (ref 0.0–0.2)

## 2022-01-11 LAB — URINALYSIS, ROUTINE W REFLEX MICROSCOPIC
Bilirubin Urine: NEGATIVE
Glucose, UA: NEGATIVE mg/dL
Ketones, ur: 5 mg/dL — AB
Nitrite: NEGATIVE
Protein, ur: 100 mg/dL — AB
RBC / HPF: >50 RBC/hpf — ABNORMAL HIGH (ref 0–5)
Specific Gravity, Urine: 1.024 (ref 1.005–1.030)
WBC, UA: >50 WBC/hpf — ABNORMAL HIGH (ref 0–5)
pH: 5 (ref 5.0–8.0)

## 2022-01-11 LAB — BASIC METABOLIC PANEL
Anion gap: 12 (ref 5–15)
BUN: 28 mg/dL — ABNORMAL HIGH (ref 8–23)
CO2: 26 mmol/L (ref 22–32)
Calcium: 8.8 mg/dL — ABNORMAL LOW (ref 8.9–10.3)
Chloride: 98 mmol/L (ref 98–111)
Creatinine, Ser: 4.22 mg/dL — ABNORMAL HIGH (ref 0.44–1.00)
GFR, Estimated: 10 mL/min — ABNORMAL LOW (ref >60)
Glucose, Bld: 71 mg/dL (ref 70–99)
Potassium: 5.4 mmol/L — ABNORMAL HIGH (ref 3.5–5.1)
Sodium: 136 mmol/L (ref 135–145)

## 2022-01-11 LAB — GLUCOSE, CAPILLARY
Glucose-Capillary: 68 mg/dL — ABNORMAL LOW (ref 70–99)
Glucose-Capillary: 181 mg/dL — ABNORMAL HIGH (ref 70–99)
Glucose-Capillary: 134 mg/dL — ABNORMAL HIGH (ref 70–99)
Glucose-Capillary: 240 mg/dL — ABNORMAL HIGH (ref 70–99)

## 2022-01-11 LAB — HEPATITIS B SURFACE ANTIBODY, QUANTITATIVE: Hep B S AB Quant (Post): 12.2 m[IU]/mL (ref >9.9)

## 2022-01-11 LAB — PHOSPHORUS: Phosphorus: 5.6 mg/dL — ABNORMAL HIGH (ref 2.5–4.6)

## 2022-01-11 MED ORDER — LIDOCAINE HCL (PF) 1 % IJ SOLN: 5.0000 mL | INTRAMUSCULAR | Status: DC | PRN

## 2022-01-11 MED ORDER — HEPARIN SODIUM (PORCINE) 1000 UNIT/ML DIALYSIS: 1000.0000 [IU] | INTRAMUSCULAR | Status: DC | PRN

## 2022-01-11 MED ORDER — LIP MEDEX EX OINT
TOPICAL_OINTMENT | Freq: Two times a day (BID) | CUTANEOUS | Status: DC
  Administered 2022-01-11 – 2022-01-16 (×7): 75 via TOPICAL
  Filled 2022-01-11: qty 7

## 2022-01-11 MED ORDER — SODIUM CHLORIDE 0.9 % IV SOLN: 100.0000 mL | INTRAVENOUS | Status: DC | PRN

## 2022-01-11 MED ORDER — BIOTENE DRY MOUTH MT LIQD
15.0000 mL | OROMUCOSAL | Status: DC
  Administered 2022-01-12: 15 mL via OROMUCOSAL

## 2022-01-11 MED ORDER — SODIUM ZIRCONIUM CYCLOSILICATE 10 G PO PACK
10.0000 g | PACK | ORAL | Status: AC
  Administered 2022-01-11: 10 g via ORAL
  Filled 2022-01-11: qty 1

## 2022-01-11 MED ORDER — GABAPENTIN 300 MG PO CAPS: 300.0000 mg | ORAL_CAPSULE | Freq: Every day | ORAL | Status: DC

## 2022-01-11 MED ORDER — ALTEPLASE 2 MG IJ SOLR: 2.0000 mg | Freq: Once | INTRAMUSCULAR | Status: DC | PRN

## 2022-01-11 MED ORDER — SENNOSIDES-DOCUSATE SODIUM 8.6-50 MG PO TABS
2.0000 | ORAL_TABLET | Freq: Every day | ORAL | Status: DC
  Administered 2022-01-11 – 2022-01-15 (×5): 2 via ORAL
  Filled 2022-01-11 (×5): qty 2

## 2022-01-11 MED ORDER — ACETAMINOPHEN 500 MG PO TABS
1000.0000 mg | ORAL_TABLET | Freq: Three times a day (TID) | ORAL | Status: DC
  Administered 2022-01-11 – 2022-01-16 (×14): 1000 mg via ORAL
  Filled 2022-01-11 (×15): qty 2

## 2022-01-11 MED ORDER — LIDOCAINE-PRILOCAINE 2.5-2.5 % EX CREA: 1.0000 "application " | TOPICAL_CREAM | CUTANEOUS | Status: DC | PRN

## 2022-01-11 MED ORDER — PENTAFLUOROPROP-TETRAFLUOROETH EX AERO: 1.0000 "application " | INHALATION_SPRAY | CUTANEOUS | Status: DC | PRN

## 2022-01-11 NOTE — Progress Notes (Addendum)
?[facility] ?Progress Note  ? ?Subjective:    ?Seen and examined patient at bedside. Noted minimal UF from yesterday's HD d/t low Bps. K+ now 5.4 after HD. Ordered Lokelma 10g X 1. Appears to be having jerking movements today. Denies SOB, CP, and N/V. Plan for HD 01/12/22. ? ?Objective ?Vitals:  ? 01/10/22 2323 01/11/22 0353 01/11/22 0809 01/11/22 1130  ?BP: 127/71 108/62 121/62 109/66  ?Pulse:  89 84 77  ?Resp:  '14 16 19  '$ ?Temp: 98.4 ?F (36.9 ?C) 98 ?F (36.7 ?C) 98.3 ?F (36.8 ?C) 98.7 ?F (37.1 ?C)  ?TempSrc: Oral Oral Oral Oral  ?SpO2:  95% 95% 94%  ?Weight:      ?Height:      ? ?Physical Exam ?General: Chronically ill-appearing; on 2L O2 [place]; NAD ?Heart: S1 an S2; No murmurs, rubs, or gallops ?Lungs: Clear anteriorly and laterally. No wheezing, rales, or rhonchi. Breathing unlabored ?Abdomen: Soft and non-tender ?Extremities: Noted jerking movements BL upper extremities. No edema BLLE ?Dialysis Access: R AVF (+) B/T  ? ?Filed Weights  ? 01/09/22 1050 01/10/22 1228 01/10/22 1640  ?Weight: 90 kg 87.1 kg 86.5 kg  ? ? ?Intake/Output Summary (Last 24 hours) at 01/11/2022 1220 ?Last data filed at 01/10/2022 1640 ?Gross per 24 hour  ?Intake --  ?Output 600 ml  ?Net -600 ml  ? ? ?Additional Objective ?Labs: ?Basic Metabolic Panel: ?Recent Labs  ?Lab 01/09/22 ?1739 01/10/22 ?0034 01/11/22 ?0258  ?NA 137 135 136  ?K 5.7* 6.2* 5.4*  ?CL 99 98 98  ?CO2 '28 26 26  '$ ?GLUCOSE 95 122* 71  ?BUN 59* 67* 28*  ?CREATININE 6.61* 7.14* 4.22*  ?CALCIUM 9.1 8.5* 8.8*  ?PHOS 5.8*  --  5.6*  ? ?Liver Function Tests: ?Recent Labs  ?Lab 01/09/22 ?1739 01/10/22 ?9179  ?AST  --  9*  ?ALT  --  <5  ?ALKPHOS  --  115  ?BILITOT  --  0.7  ?PROT  --  6.2*  ?ALBUMIN 3.0* 2.9*  ? ?No results for input(s): LIPASE, AMYLASE in the last 168 hours. ?CBC: ?Recent Labs  ?Lab 01/09/22 ?1141 01/09/22 ?1739 01/10/22 ?1505 01/11/22 ?0258  ?WBC 16.3* 15.7* 14.1* 14.0*  ?NEUTROABS  --   --   --  11.6*  ?HGB 11.9* 11.8* 11.1* 10.9*  ?HCT 38.5 37.7 36.0  35.4*  ?MCV 95.5 95.2 95.5 96.5  ?PLT 236 229 228 227  ? ?Blood Culture ?   ?Component Value Date/Time  ? SDES FLUID LEFT PLEURAL 04/06/2017 1248  ? SDES FLUID LEFT PLEURAL 04/06/2017 1248  ? SPECREQUEST NONE 04/06/2017 1248  ? SPECREQUEST NONE 04/06/2017 1248  ? CULT NO GROWTH 5 DAYS 04/06/2017 1248  ? REPTSTATUS 04/11/2017 FINAL 04/06/2017 1248  ? REPTSTATUS 04/08/2017 FINAL 04/06/2017 1248  ? ? ?Cardiac Enzymes: ?No results for input(s): CKTOTAL, CKMB, CKMBINDEX, TROPONINI in the last 168 hours. ?CBG: ?Recent Labs  ?Lab 01/10/22 ?1140 01/10/22 ?1719 01/10/22 ?2122 01/11/22 ?6979 01/11/22 ?1129  ?GLUCAP 132* 96 78 68* 181*  ? ?Iron Studies: No results for input(s): IRON, TIBC, TRANSFERRIN, FERRITIN in the last 72 hours. ?Lab Results  ?Component Value Date  ? INR 1.5 (H) 01/09/2022  ? INR 1.65 03/30/2017  ? INR 1.26 03/13/2017  ? ?Studies/Results: ?CT THORACIC SPINE WO CONTRAST ? ?Result Date: 01/09/2022 ?CLINICAL DATA:  Trauma EXAM: CT THORACIC AND LUMBAR SPINE WITHOUT CONTRAST TECHNIQUE: Multidetector CT imaging of the thoracic and lumbar spine was performed without contrast. Multiplanar CT image reconstructions were also generated. RADIATION DOSE REDUCTION:  This exam was performed according to the departmental dose-optimization program which includes automated exposure control, adjustment of the mA and/or kV according to patient size and/or use of iterative reconstruction technique. COMPARISON:  No prior dedicated thoracic or lumbar CT. Correlation is made with 07/28/2020 CT abdomen pelvis FINDINGS: Evaluation is somewhat limited by osteopenia and photon starvation. CT THORACIC SPINE FINDINGS Alignment: Dextrocurvature of the thoracic spine. Trace anterolisthesis of C6 on C7. No listhesis in the lumbar spine. Vertebrae: No acute fracture or suspicious osseous lesion. Flowing osteophytes extending from T3 through L1, most likely diffuse idiopathic skeletal hyperostosis (DISH). Disruption of the osteophytes at the  inferior aspect of T8, with lucency extending into the vertebral body of T8 (series 11, image 29), concerning for acute fracture. This does not appear to extend through the posterior cortex or involves significant retropulsion. Redemonstrated chronic endplate deformity at the inferior aspect of T12, unchanged compared to 07/28/2020. Paraspinal and other soft tissues: Please see same-day CT chest abdomen pelvis. Disc levels: No high-grade spinal canal stenosis or neural foraminal narrowing. CT LUMBAR SPINE FINDINGS Segmentation: 5 lumbar type vertebrae. Alignment: Levocurvature.  No significant listhesis. Vertebrae: No acute fracture or suspicious osseous lesion. Redemonstrated endplate deformity at the inferior aspect of T12 and degenerative changes at the L2-L3 disc space, which appears similar to 07/28/2020. Paraspinal and other soft tissues: Please see same-day CT chest abdomen pelvis. Disc levels: T12-L1: No significant disc bulge. No spinal canal stenosis or neural foraminal narrowing. L1-L2: Broad-based disc bulge. Mild spinal canal stenosis. Facet arthropathy. Mild right neural foraminal narrowing. L2-L3: Moderate disc bulge. Facet arthropathy. Mild spinal canal stenosis. No neural foraminal narrowing. L3-L4: No significant disc bulge. No spinal canal stenosis. Mild left neural foraminal narrowing. L4-L5: Mild disc bulge. Moderate to severe facet arthropathy. Ligamentum flavum hypertrophy. Mild spinal canal stenosis. Mild left neural foraminal narrowing. L5-S1: No significant disc bulge. Severe left and moderate right facet arthropathy. No spinal canal stenosis moderate left neural foraminal narrowing. IMPRESSION: CT THORACIC SPINE IMPRESSION 1. Lucency through the osteophytes at the inferior aspect of T8 extending into the inferior aspect of the vertebral body, concerning for an atypical fracture in the setting of DISH. 2. No other acute fracture or significant listhesis. 3. Scoliosis. CT LUMBAR SPINE  IMPRESSION No acute fracture or traumatic listhesis in the spine. Please see same day CT chest abdomen pelvis for soft tissue and visceral findings. These results were called by telephone at the time of interpretation on 01/09/2022 at 1:38 pm to provider [facility] RAY , who verbally acknowledged these results. Electronically Signed   By: Merilyn Baba M.D.   On: 01/09/2022 13:38  ? ?MR THORACIC SPINE WO CONTRAST ? ?Result Date: 01/09/2022 ?CLINICAL DATA:  Back trauma, abnormal neuro exam, CT or xray positive (Age >= 16y) EXAM: MRI THORACIC SPINE WITHOUT CONTRAST TECHNIQUE: Multiplanar, multisequence MR imaging of the thoracic spine was performed. No intravenous contrast was administered. COMPARISON:  CT 01/09/2022 FINDINGS: Technical Note: Despite efforts by the technologist and patient, motion artifact is present on today's exam and could not be eliminated. This reduces exam sensitivity and specificity. Alignment: Mildly exaggerated thoracic kyphosis. Slight thoracic dextrocurvature. Mild grade 1 anterolisthesis of T1 on T2 and T2 on T3. Vertebrae: Flowing anterior endplate osteophytes throughout the thoracic spine extending from T3 through L1 compatible with diffuse idiopathic skeletal hyperostosis. Nondisplaced fracture through the inferior endplate and overlying osteophyte at T8 with trace fluid in the fracture cleft and overlying prevertebral soft tissue edema (series 17, image 11). Fracture  line does not appear to reach the posterior wall of the vertebral body. No retropulsion. The no additional fractures. No evidence of discitis. Discogenic endplate marrow changes within the visualized lumbar spine. No marrow replacing bone lesion. Cord:  Normal signal and morphology. Paraspinal and other soft tissues: Negative. Disc levels: Mild facet-predominant degenerative changes within the thoracic spine. No foraminal or canal stenosis at any level. IMPRESSION: 1. Acute nondisplaced fracture through the inferior endplate and  overlying osteophyte at T8 with mild overlying prevertebral soft tissue edema. 2. Diffuse idiopathic skeletal hyperostosis. 3. Mild degenerative changes of the thoracic spine without foraminal or canal stenosis

## 2022-01-11 NOTE — Progress Notes (Signed)
?PROGRESS NOTE ? ?[place]  ?DOB: [birth year]  ?PCP: Donnajean Lopes, MD ?PJS:[id number]  ?DOA: 01/09/2022 ? LOS: 2 days  ?Hospital Day: 3 ? ?Brief narrative: ?Jean Mcclain is a 76 y.o. female with PMH significant for ESRD-HD-MWF, DM2, HTN, HLD, obesity, OSA, COPD on chronic nightly O2, vitamin D deficiency, venous stasis ulcer ?Patient presented to the ED on 4/2 with complaint of back pain. ?Per history, patient has chronic bilateral leg weakness, essentially nonambulatory and gets moved by a [employer] lift.  1 week ago, while being moved with a [employer] lift, she apparently was rolled up into a fetal position after which she started having back pain not controlled with outpatient meds and is brought to the ED. ? ?CT thoracic spine showed lucency through the osteophytes at the inferior aspect of T8 ?extending into the inferior aspect of the vertebral body, concerning for an atypical fracture in the setting of DISH. ?CT lumbar spine did not show any acute fracture or traumatic listhesis in the spine. ?Admitted to hospitalist service ?Neurosurgery consulted  ?MRI spine was obtained which showed an acute nondisplaced fracture through the inferior endplate and overlying osteophyte at T8 with mild overlying prevertebral soft tissue edema, diffuse idiopathic skeletal hyperostosis. ? ?Subjective: ?Patient was seen and examined this morning. ?Propped up in bed.  More awake today.  But feels jittery.  She states she is not usually like this and is able to use her hands.  Today she feels like she is feeling everything. ? ?Principal Problem: ?  Thoracic spine fracture ([place]) ?Active Problems: ?  Insulin-requiring or dependent type II diabetes mellitus ([place]) ?  Essential hypertension ?  Kidney lesion ?  OSA (obstructive sleep apnea) ?  ESRD on hemodialysis ([place]) ?  Intractable pain ?  GERD (gastroesophageal reflux disease) ?  HLD (hyperlipidemia) ?  Hyperkalemia ?  Anxiety ?  History of DVT (deep vein thrombosis) ?  Gout ?   ?Assessment and Plan: ?Acute nondisplaced T8 Fracture ?Intractable back pain ?Chronic immobility ?-CT scan and MRI finding as above.  ?-No surgical intervention recommended by neurosurgery.  ?-Currently on pain meds.  PT eval ordered. ?-Palliative care consulted. ?  ?ESRD on HD ?Hypertension ?Hyperkalemia/hyperphosphatemia ?-Potassium level and magnesium level remains high.  ESRD patient. ?-Euvolemic.  Remains on midodrine. ?Recent Labs  ?Lab 01/09/22 ?1141 01/09/22 ?1739 01/10/22 ?2924 01/11/22 ?0258  ?K 5.3* 5.7* 6.2* 5.4*  ?PHOS  --  5.8*  --  5.6*  ? ? ?Muscle jitteriness ?-It seems PTA, patient was on gabapentin 300 mg at bedtime.  Currently getting 300 mg 3 times daily.  ?-It probably is contributing to her jitteriness.  Nephrology reduced the dose to 300 mg at bedtime for  ?-Continue to monitor symptoms. ? ?Foul-smelling urine ?-Obtain UA and urine culture. ? ?Type 2 diabetes mellitus ?Hypoglycemia ?-A1c 7.2 on 01/09/2022 ?-Home meds include Novolin twice daily 32 units and 12 units ?-Currently on sliding scale insulin and Accu-Cheks.  Blood sugar level low at 68 this morning. ?-continue to monitor ?Recent Labs  ?Lab 01/10/22 ?1140 01/10/22 ?1719 01/10/22 ?2122 01/11/22 ?4628 01/11/22 ?1129  ?GLUCAP 132* 96 78 68* 181*  ? ? ?COPD on nocturnal O2 supplementation ?-continue home regimen ?  ?Hx of DVT ?-continue eliquis ?  ?GERD ?-protonix ?  ?HLD ?-continue statin ?   ?Parkinson's disease ?Restless leg syndrome ?Anxiety ?-continue Sinemet, Neurontin, hydroxyzine, Zoloft, Requip ?  ?Gout ?-Allopurinol, ?  ?Right kidney lesion ?-as described on CT; consider renal US while she is inpt;  let's get her back pain under control first ? ?Goals of care ?  Code Status: DNR  ? ? ?Mobility: Chronically immobile ? ?Nutritional status:  ?Body mass index is 37.24 kg/m?.  ?  ?  ? ? ? ? ?Diet:  ?Diet Order   ? ?       ?  Diet renal with fluid restriction Fluid restriction: 1200 mL Fluid; Room service appropriate? Yes with Assist;  Fluid consistency: Thin  Diet effective now       ?  ? ?  ?  ? ?  ? ? ?DVT prophylaxis:  ?apixaban (ELIQUIS) tablet 2.5 mg Start: 01/09/22 2200 ?apixaban (ELIQUIS) tablet 2.5 mg   ?Antimicrobials: None ?Fluid: None ?Consultants: Nephrology, neurosurgery, palliative care ?Family Communication: None at bedside ? ?Status is: Inpatient ? ?Continue in-hospital care because: Pending placement. ?Level of care: Telemetry Medical  ? ?Dispo: The patient is from: ALF ?             Anticipated d/c is to: Pending clinical course ?             Patient currently is not medically stable to d/c. ?Difficult to place patient No ? ? ? ? ?Infusions:  ? ? ? ?Scheduled Meds: ? acetaminophen  1,000 mg Oral TID  ? allopurinol  100 mg Oral Daily  ? antiseptic oral rinse  15 mL Mouth Rinse Q2H while awake  ? apixaban  2.5 mg Oral BID  ? atorvastatin  20 mg Oral Daily  ? calcium acetate  667 mg Oral TID WC  ? carbidopa-levodopa  2 tablet Oral Daily  ? Chlorhexidine Gluconate Cloth  6 each Topical Q0600  ? docusate sodium  200 mg Oral QHS  ? fluticasone  2 spray Each Nare Daily  ? [START ON 01/12/2022] gabapentin  300 mg Oral QHS  ? insulin aspart  0-15 Units Subcutaneous TID WC  ? insulin aspart  0-5 Units Subcutaneous QHS  ? lip balm   Topical BID  ? loratadine  10 mg Oral Daily  ? midodrine  10 mg Oral Once per day on Mon Wed Fri  ? And  ? midodrine  5 mg Oral Once per day on Mon Wed Fri  ? predniSONE  2.5 mg Oral Daily  ? rOPINIRole  4 mg Oral TID  ? senna-docusate  2 tablet Oral QHS  ? sertraline  25 mg Oral Daily  ? sodium zirconium cyclosilicate  10 g Oral STAT  ? ? ?PRN meds: ?albuterol, fentaNYL (SUBLIMAZE) injection, oxyCODONE  ? ?Antimicrobials: ?Anti-infectives (From admission, onward)  ? ? None  ? ?  ? ? ?Objective: ?Vitals:  ? 01/11/22 0809 01/11/22 1130  ?BP: 121/62 109/66  ?Pulse: 84 77  ?Resp: 16 19  ?Temp: 98.3 ?F (36.8 ?C) 98.7 ?F (37.1 ?C)  ?SpO2: 95% 94%  ? ? ?Intake/Output Summary (Last 24 hours) at 01/11/2022 1403 ?Last data  filed at 01/10/2022 1640 ?Gross per 24 hour  ?Intake --  ?Output 600 ml  ?Net -600 ml  ? ?Filed Weights  ? 01/09/22 1050 01/10/22 1228 01/10/22 1640  ?Weight: 90 kg 87.1 kg 86.5 kg  ? ?Weight change: -2.9 kg ?Body mass index is 37.24 kg/m?.  ? ?Physical Exam: ?General exam: Pleasant, elderly Caucasian female.  Pain controlled at rest ?Skin: No rashes, lesions or ulcers. ?HEENT: Atraumatic, normocephalic, no obvious bleeding ?Lungs: Clear to auscultate bilaterally ?CVS: Regular rate and rhythm, no murmur ?GI/Abd soft, nontender, nondistended, positive ?CNS: Alert, awake, oriented to place and person,  feels jittery ?Psychiatry: Sad affect ?Extremities: No pedal edema, no calf tenderness ? ?Data Review: I have personally reviewed the laboratory data and studies available. ? ?F/u labs ordered ?Unresulted Labs (From admission, onward)  ? ?  Start     Ordered  ? 01/11/22 1216  Urinalysis, Routine w reflex microscopic  Once,   R       ? 01/11/22 1215  ? 01/11/22 1216  Urine Culture  Once,   R       ?Question:  Indication  Answer:  Dysuria  ? 01/11/22 1215  ? 01/09/22 1108  Urinalysis, Routine w reflex microscopic Urine, Clean Catch  Once,   R       ? 01/09/22 1107  ? ?  ?  ? ?  ? ? ?Signed, ?Terrilee Croak, MD ?[facility] ?01/11/2022 ? ? ? ? ? ? ? ? ? ? ? ? ?

## 2022-01-11 NOTE — Consult Note (Signed)
?Consultation Note ?Date: 01/11/2022  ? ?Patient Name: Jean Mcclain  ?DOB: Jul 01, 1946  MRN: [id number]  Age / Sex: 76 y.o., female  ?PCP: Donnajean Lopes, MD ?Referring Physician: Terrilee Croak, MD ? ?Reason for Consultation: Establishing goals of care ? ?HPI/Patient Profile: 76 y.o. female  with past medical history of type 2 diabetes, hyperlipidemia, end-stage renal disease on hemodialysis, COPD on nightly O2, venous stasis ulcer, and Parkinson's admitted on 01/09/2022 with back pain.  Patient is nonambulatory at baseline and is moved by [facility] lift-1 week ago while being moved with left her back pain started. CT thoracic spine showed lucency through the osteophytes at the inferior aspect of T8 extending into the inferior aspect of the vertebral body, concerning for an atypical fracture in the setting of DISH.  Patient diagnosed with acute nondisplaced T8 fracture.  PMT consulted to discuss goals of care. ? ?Clinical Assessment and Goals of Care: ?I have reviewed medical records including EPIC notes, labs and imaging, received report from RN, assessed the patient and then met with patient to discuss diagnosis prognosis, GOC, EOL wishes, disposition and options. ? ?I introduced Palliative Medicine as specialized medical care for people living with serious illness. It focuses on providing relief from the symptoms and stress of a serious illness. The goal is to improve quality of life for both the patient and the family. ? ?Patient tells me she has been living at an assisted living facility.  She tells me she does not ambulate and is either in the bed or wheelchair.  She tells me her appetite was okay prior to the start of this pain.  Tells me she is usually cognitively intact however dealing with this pain has affected her mind. ? ? We discussed patient's current illness and what it means in the larger context of patient's on-going co-morbidities.  We discussed that at  baseline she goes to hemodialysis regularly and she tells me she tolerates it very well.  We discussed attempts to better control her pain.  Patient tells me she is quite sensitive to narcotics and they make her drowsy.  She tells me that other pain meds were tried earlier in hospitalization but made her too drowsy.  She tells me as an outpatient she usually takes Tylenol for pain with an occasional oxycodone.  She tells me her most troublesome symptom is her dry mouth.  She also complains of some constipation.  No shortness of breath at this time. ? ?I attempted to elicit values and goals of care important to the patient.  Patient is hopeful for improvement in pain and return to assisted living facility. ? ?Encouraged patient to consider DNR/DNI status understanding evidenced based poor outcomes in similar hospitalized patients, as the cause of the arrest is likely associated with chronic/terminal disease rather than a reversible acute cardio-pulmonary event.  Patient tells me she would never want to be on the ventilator.  We discussed that if she required CPR she would likely require ventilator support.  She tells me she has had this discussion with her brother in the past and they have come to the conclusion that she would likely not benefit from something like CPR.  We discussed changing her CODE STATUS to DNR to allow a natural death and she agrees this is appropriate and in line with her goals. ? ?We discussed who would make decisions for her if she became unable and she tells me her next of kin is her brother and she would like him to  be her decision-maker. ? ?Discussed with patient the importance of continued conversation with family and the medical providers regarding overall plan of care and treatment options, ensuring decisions are within the context of the patient?s values and GOCs.   ? ?Questions and concerns were addressed. The family was encouraged to call with questions or concerns. ? ?Primary  Decision Maker ?PATIENT ?Next of kin, brother Ilona Sorrel, if patient unable ? ?SUMMARY OF RECOMMENDATIONS   ?-CODE STATUS changed to DNR per patient request ?-Patient tells me she does not tolerate narcotics well and does not want to trial having been scheduled for her; will schedule Tylenol and continue as needed oxycodone and fentanyl ?-Schedule Biotene for dry mouth ?-Add senna every night ?-Next of kin brother is decision-maker if patient is unable ? ?Code Status/Advance Care Planning: ?DNR ? ? ?Discharge Planning:  likely back to previous environment   ? ?  ? ?Primary Diagnoses: ?Present on Admission: ? Thoracic spine fracture ([place]) ? Essential hypertension ? OSA (obstructive sleep apnea) ? Intractable pain ? HLD (hyperlipidemia) ? ? ?I have reviewed the medical record, interviewed the patient and family, and examined the patient. The following aspects are pertinent. ? ?Past Medical History:  ?Diagnosis Date  ? Acute and chronic respiratory failure (acute-on-chronic) ([place])   ? Anxiety   ? Arthritis   ? osteo  ? Chronic kidney disease   ? Diabetes mellitus, type 2 ([place])   ? Type II  ? DVT (deep venous thrombosis) ([place]) 03/2017  ? left popliteal  ? Facet joint disease   ? Facet joint disease   ? foot  ? Fibula fracture 02/2017  ? left  ? GERD (gastroesophageal reflux disease)   ? H/O blood clots   ? Hyperlipidemia   ? Hypertension   ? Macrocytic anemia   ? Morbid obesity ([place])   ? OSA (obstructive sleep apnea)   ? does not use CPAP   ? Osteopenia   ? Renal disorder   ? MWF dialysis  ? Shortness of breath   ? with exertion  ? Supplemental oxygen dependent   ? Venous stasis ulcers ([facility])   ? Vitamin D deficiency   ? ?Social History  ? ?Socioeconomic History  ? Marital status: Single  ?  Spouse name: Not on file  ? Number of children: Not on file  ? Years of education: Not on file  ? Highest education level: Not on file  ?Occupational History  ? Not on file  ?Tobacco Use  ? Smoking status: Former  ?  Years: 27.00  ?   Types: Cigarettes  ?  Quit date: 10/10/1981  ?  Years since quitting: 40.2  ? Smokeless tobacco: Never  ?Vaping Use  ? Vaping Use: Never used  ?Substance and Sexual Activity  ? Alcohol use: No  ? Drug use: No  ? Sexual activity: Not on file  ?Other Topics Concern  ? Not on file  ?Social History Narrative  ? ** Merged History Encounter **  ?    ? ?Social Determinants of Health  ? ?Financial Resource Strain: Not on file  ?Food Insecurity: Not on file  ?Transportation Needs: Not on file  ?Physical Activity: Not on file  ?Stress: Not on file  ?Social Connections: Not on file  ? ?Family History  ?Problem Relation Age of Onset  ? Leukemia Mother   ? Hypertension Brother   ? Emphysema Father   ? ?Scheduled Meds: ? acetaminophen  1,000 mg Oral TID  ? allopurinol  100 mg  Oral Daily  ? antiseptic oral rinse  15 mL Mouth Rinse Q2H while awake  ? apixaban  2.5 mg Oral BID  ? atorvastatin  20 mg Oral Daily  ? calcium acetate  667 mg Oral TID WC  ? carbidopa-levodopa  2 tablet Oral Daily  ? Chlorhexidine Gluconate Cloth  6 each Topical Q0600  ? docusate sodium  200 mg Oral QHS  ? fluticasone  2 spray Each Nare Daily  ? gabapentin  300 mg Oral TID  ? insulin aspart  0-15 Units Subcutaneous TID WC  ? insulin aspart  0-5 Units Subcutaneous QHS  ? loratadine  10 mg Oral Daily  ? midodrine  10 mg Oral Once per day on Mon Wed Fri  ? And  ? midodrine  5 mg Oral Once per day on Mon Wed Fri  ? predniSONE  2.5 mg Oral Daily  ? rOPINIRole  4 mg Oral TID  ? senna-docusate  2 tablet Oral QHS  ? sertraline  25 mg Oral Daily  ? sodium zirconium cyclosilicate  10 g Oral STAT  ? ?Continuous Infusions: ?PRN Meds:.albuterol, fentaNYL (SUBLIMAZE) injection, oxyCODONE ?No Known Allergies ?Review of Systems  ?Constitutional:  Positive for appetite change and fatigue.  ?HENT:    ?     Dry mouth  ?Respiratory:  Negative for shortness of breath.   ?Musculoskeletal:  Positive for back pain.  ? ?Physical Exam ?Pulmonary:  ?   Effort: Pulmonary effort is  normal.  ?Skin: ?   General: Skin is warm and dry.  ?Neurological:  ?   Mental Status: She is alert and oriented to person, place, and time.  ? ? ?Vital Signs: BP 121/62 (BP Location: Right Arm)   Pulse 84   Temp

## 2022-01-11 NOTE — Progress Notes (Signed)
Pt receives out-pt HD at [facility] on MWF. Pt arrives at 9:20 for 9:40 chair time. Will assist as needed.  ? ?Jean Mcclain ?Renal Navigator ?[phone] ?

## 2022-01-12 DIAGNOSIS — Z7189 Other specified counseling: Secondary | ICD-10-CM | POA: Diagnosis not present

## 2022-01-12 DIAGNOSIS — S22068A Other fracture of T7-T8 thoracic vertebra, initial encounter for closed fracture: Secondary | ICD-10-CM | POA: Diagnosis not present

## 2022-01-12 DIAGNOSIS — N186 End stage renal disease: Secondary | ICD-10-CM | POA: Diagnosis not present

## 2022-01-12 DIAGNOSIS — Z515 Encounter for palliative care: Secondary | ICD-10-CM | POA: Diagnosis not present

## 2022-01-12 LAB — CBC WITH DIFFERENTIAL/PLATELET
Abs Immature Granulocytes: 0.08 10*3/uL — ABNORMAL HIGH (ref 0.00–0.07)
Basophils Absolute: 0 10*3/uL (ref 0.0–0.1)
Basophils Relative: 0 %
Eosinophils Absolute: 0.4 10*3/uL (ref 0.0–0.5)
Eosinophils Relative: 3 %
HCT: 34.4 % — ABNORMAL LOW (ref 36.0–46.0)
Hemoglobin: 10.7 g/dL — ABNORMAL LOW (ref 12.0–15.0)
Immature Granulocytes: 1 %
Lymphocytes Relative: 12 %
Lymphs Abs: 1.4 10*3/uL (ref 0.7–4.0)
MCH: 29.9 pg (ref 26.0–34.0)
MCHC: 31.1 g/dL (ref 30.0–36.0)
MCV: 96.1 fL (ref 80.0–100.0)
Monocytes Absolute: 1.1 10*3/uL — ABNORMAL HIGH (ref 0.1–1.0)
Monocytes Relative: 9 %
Neutro Abs: 8.6 10*3/uL — ABNORMAL HIGH (ref 1.7–7.7)
Neutrophils Relative %: 75 %
Platelets: 231 10*3/uL (ref 150–400)
RBC: 3.58 MIL/uL — ABNORMAL LOW (ref 3.87–5.11)
RDW: 14.6 % (ref 11.5–15.5)
WBC: 11.5 10*3/uL — ABNORMAL HIGH (ref 4.0–10.5)
nRBC: 0 % (ref 0.0–0.2)

## 2022-01-12 LAB — URINE CULTURE

## 2022-01-12 LAB — GLUCOSE, CAPILLARY
Glucose-Capillary: 184 mg/dL — ABNORMAL HIGH (ref 70–99)
Glucose-Capillary: 148 mg/dL — ABNORMAL HIGH (ref 70–99)
Glucose-Capillary: 122 mg/dL — ABNORMAL HIGH (ref 70–99)

## 2022-01-12 LAB — RENAL FUNCTION PANEL
Albumin: 2.8 g/dL — ABNORMAL LOW (ref 3.5–5.0)
Anion gap: 13 (ref 5–15)
BUN: 52 mg/dL — ABNORMAL HIGH (ref 8–23)
CO2: 27 mmol/L (ref 22–32)
Calcium: 8.8 mg/dL — ABNORMAL LOW (ref 8.9–10.3)
Chloride: 93 mmol/L — ABNORMAL LOW (ref 98–111)
Creatinine, Ser: 6.4 mg/dL — ABNORMAL HIGH (ref 0.44–1.00)
GFR, Estimated: 6 mL/min — ABNORMAL LOW (ref >60)
Glucose, Bld: 119 mg/dL — ABNORMAL HIGH (ref 70–99)
Phosphorus: 6.8 mg/dL — ABNORMAL HIGH (ref 2.5–4.6)
Potassium: 4.8 mmol/L (ref 3.5–5.1)
Sodium: 133 mmol/L — ABNORMAL LOW (ref 135–145)

## 2022-01-12 MED ORDER — ORAL CARE MOUTH RINSE
15.0000 mL | OROMUCOSAL | Status: DC
  Administered 2022-01-12 – 2022-01-16 (×15): 15 mL via OROMUCOSAL

## 2022-01-12 MED ORDER — SODIUM CHLORIDE 0.9 % IV SOLN
1.0000 g | INTRAVENOUS | Status: DC
  Administered 2022-01-12 – 2022-01-15 (×4): 1 g via INTRAVENOUS
  Filled 2022-01-12 (×5): qty 10

## 2022-01-12 MED ORDER — INSULIN NPH (HUMAN) (ISOPHANE) 100 UNIT/ML ~~LOC~~ SUSP
5.0000 [IU] | Freq: Two times a day (BID) | SUBCUTANEOUS | Status: DC
  Administered 2022-01-12 – 2022-01-16 (×7): 5 [IU] via SUBCUTANEOUS
  Filled 2022-01-12: qty 10

## 2022-01-12 NOTE — Progress Notes (Signed)
?                                                   ?Daily Progress Note  ? ?Patient Name: Jean Mcclain       Date: 01/12/2022 ?DOB: 08-18-1946  Age: 76 y.o. MRN#: [id number] ?Attending Physician: Terrilee Croak, MD ?Primary Care Physician: Donnajean Lopes, MD ?Admit Date: 01/09/2022 ? ?Reason for Consultation/Follow-up: Establishing goals of care ? ?Subjective: ?Continues to complain of same symptoms as yesterday including a very dry mouth, pain, constipation. ? ?Length of Stay: 3 ? ?Current Medications: ?Scheduled Meds:  ? acetaminophen  1,000 mg Oral TID  ? allopurinol  100 mg Oral Daily  ? apixaban  2.5 mg Oral BID  ? atorvastatin  20 mg Oral Daily  ? calcium acetate  667 mg Oral TID WC  ? carbidopa-levodopa  2 tablet Oral Daily  ? Chlorhexidine Gluconate Cloth  6 each Topical Q0600  ? docusate sodium  200 mg Oral QHS  ? fluticasone  2 spray Each Nare Daily  ? insulin aspart  0-15 Units Subcutaneous TID WC  ? insulin aspart  0-5 Units Subcutaneous QHS  ? insulin NPH Human  5 Units Subcutaneous BID AC & HS  ? lip balm   Topical BID  ? loratadine  10 mg Oral Daily  ? mouth rinse  15 mL Mouth Rinse Q2H while awake  ? midodrine  10 mg Oral Once per day on Mon Wed Fri  ? And  ? midodrine  5 mg Oral Once per day on Mon Wed Fri  ? predniSONE  2.5 mg Oral Daily  ? rOPINIRole  4 mg Oral TID  ? senna-docusate  2 tablet Oral QHS  ? sertraline  25 mg Oral Daily  ? ? ?Continuous Infusions: ? cefTRIAXone (ROCEPHIN)  IV    ? ? ?PRN Meds: ?albuterol, oxyCODONE ? ?Physical Exam ?Constitutional:   ?   General: She is not in acute distress. ?   Appearance: She is ill-appearing.  ?Pulmonary:  ?   Effort: Pulmonary effort is normal.  ?Skin: ?   General: Skin is warm and dry.  ?Neurological:  ?   Mental Status: She is alert.  ?         ? ?Vital Signs: BP 124/73   Pulse 80   Temp (!) 97.3 ?F  (36.3 ?C) (Temporal)   Resp 20   Ht 5' (1.524 m)   Wt 80.3 kg   SpO2 97%   BMI 34.57 kg/m?  ?SpO2: SpO2: 97 % ?O2 Device: O2 Device: Nasal Cannula ?O2 Flow Rate: O2 Flow Rate (L/min): 2 L/min ? ?Intake/output summary:  ?Intake/Output Summary (Last 24 hours) at 01/12/2022 1446 ?Last data filed at 01/12/2022 1228 ?Gross per 24 hour  ?Intake --  ?Output 1070 ml  ?Net -1070 ml  ? ?LBM: Last BM Date : 01/07/22 ?Baseline Weight: Weight: 90 kg ?Most recent weight: Weight: 80.3 kg ? ?     ?Palliative Assessment/Data: PPS 30% ? ? ? ?Flowsheet Rows   ? ?Flowsheet Row Most Recent Value  ?Intake Tab   ?Referral Department Hospitalist  ?Unit at Time of Referral Cardiac/Telemetry Unit  ?Palliative Care Primary Diagnosis Nephrology  ?Date Notified 01/10/22  ?Palliative Care Type New Palliative care  ?Reason for referral Clarify Goals of Care  ?Date of Admission 01/09/22  ?Date first  seen by Palliative Care 01/11/22  ?# of days Palliative referral response time 1 Day(s)  ?# of days IP prior to Palliative referral 1  ?Clinical Assessment   ?Psychosocial & Spiritual Assessment   ?Palliative Care Outcomes   ? ?  ? ? ?Patient Active Problem List  ? Diagnosis Date Noted  ? Thoracic spine fracture ([place]) 01/09/2022  ? Hyperkalemia 01/09/2022  ? Anxiety 01/09/2022  ? History of DVT (deep vein thrombosis) 01/09/2022  ? Gout 01/09/2022  ? Postmenopausal vaginal bleeding   ? Acute blood loss anemia 06/25/2020  ? Acute on chronic respiratory failure with hypoxia and hypercapnia ([place]) 06/25/2020  ? Advanced care planning/counseling discussion   ? Goals of care, counseling/discussion   ? Obstructive sleep apnea   ? Primary osteoarthritis, left ankle and foot   ? Ankle fracture 06/14/2020  ? HLD (hyperlipidemia) 06/14/2020  ? Dyspnea, unspecified 02/05/2020  ? Intractable pain 01/24/2020  ? Bilateral shoulder pain 01/23/2020  ? Right knee pain 01/23/2020  ? Uncontrolled type 2 diabetes mellitus with hyperglycemia, with long-term current use of  insulin ([place]) 01/23/2020  ? Pressure injury of skin 01/22/2020  ? Mild protein-calorie malnutrition ([place]) 12/16/2019  ? Personal history of anaphylaxis 12/05/2019  ? Pruritus, unspecified 12/05/2019  ? ESRD on hemodialysis ([place]) 11/20/2019  ? Myoclonus, segmental 11/20/2019  ? Allergy, unspecified, initial encounter 07/29/2019  ? Anemia in chronic kidney disease 07/15/2019  ? Coagulation defect, unspecified ([place]) 07/13/2019  ? Iron deficiency anemia, unspecified 07/11/2019  ? Encounter for immunization 07/06/2019  ? End stage renal disease ([place]) 07/02/2019  ? GERD (gastroesophageal reflux disease) 07/02/2019  ? Low back pain 07/02/2019  ? BMI 34.0-34.9,adult 07/02/2019  ? Other specified disorders of bone density and structure, unspecified site 07/02/2019  ? Secondary hyperparathyroidism of renal origin ([place]) 07/02/2019  ? Unspecified osteoarthritis, unspecified site 07/02/2019  ? OSA (obstructive sleep apnea) 03/13/2019  ? Chronic acquired lymphedema 11/05/2018  ? Kidney lesion 03/30/2017  ? Acute deep vein thrombosis (DVT) of popliteal vein of left lower extremity ([place]) 03/11/2017  ? Insulin-requiring or dependent type II diabetes mellitus ([place]) 03/11/2017  ? Essential hypertension 03/11/2017  ? Impingement syndrome of left ankle 01/31/2017  ? Posterior tibial tendinitis, left leg 01/31/2017  ? Incarcerated ventral hernia 01/02/2013  ? ? ?Palliative Care Assessment & Plan  ? ?HPI: ?76 y.o. female  with past medical history of type 2 diabetes, hyperlipidemia, end-stage renal disease on hemodialysis, COPD on nightly O2, venous stasis ulcer, and Parkinson's admitted on 01/09/2022 with back pain.  Patient is nonambulatory at baseline and is moved by [facility] lift-1 week ago while being moved with left her back pain started. CT thoracic spine showed lucency through the osteophytes at the inferior aspect of T8 extending into the inferior aspect of the vertebral body, concerning for an atypical fracture in the setting of  DISH.  Patient diagnosed with acute nondisplaced T8 fracture.  PMT consulted to discuss goals of care. ? ?Assessment: ?Reviewed symptom management with patient.  She tells me her mouth remains very dry.  On further review it is noted that patient has not been receiving Biotene.  Nurse shares they do not have access to [place].  Orders adjusted so that nurses can provide oral care scheduled while patient is awake.  Patient also tells me she has yet to have a bowel movement however she does feel like she may have one soon and does not want to adjust her meds yet.  Lastly we discussed her ongoing pain, she  continues to insist she does not want narcotics.  Nurse shares patient has not received scheduled Tylenol today as she was in dialysis this morning and she will give it soon hopeful for better control of pain.  Otherwise continue current course of care. ? ?Recommendations/Plan: ?-CODE STATUS changed to DNR per patient request ?-Patient tells me she does not tolerate narcotics well and does not want to trial having them scheduled for her; will schedule Tylenol and continue as needed oxycodone -has not received this a.m. and waiting to receive ?Mouth care every 2 hours while awake ?Continue senna ?-Next of kin brother is decision-maker if patient is unable ? ? ? ?Code Status: ?DNR ? ? ?Care plan was discussed with patient and RN ? ?Thank you for allowing the Palliative Medicine Team to assist in the care of this patient. ? ? ?*Please note that this is a verbal dictation therefore any spelling or grammatical errors are due to the "[place] [employer]" system interpretation. ? ?Juel Burrow, DNP, AGNP-C ?Palliative Medicine Team ?Team Phone # [phone]  ?Pager [phone] ? ?

## 2022-01-12 NOTE — Progress Notes (Addendum)
? [facility] ?Progress Note  ? ?Subjective:    ?Seen and examined on HD. No jerking movements noted today. Gabapentin now stopped by Primary. Currently c/o back pain. Currently on low-dose Oxycodone PRN. She reports Oxycodone has made her feel "loopy" in the past. Also noted low BP causing UF to be held. Midodrine given during HD.  ? ?Objective ?Vitals:  ? 01/12/22 1000 01/12/22 1030 01/12/22 1100 01/12/22 1130  ?BP: 125/72 116/80 139/64 (!) 96/56  ?Pulse: 76 82 90 (!) 54  ?Resp: (!) 21     ?Temp:      ?TempSrc:      ?SpO2:      ?Weight:      ?Height:      ? ?Physical Exam ?General: Chronically ill-appearing; on 2L O2 [place]; NAD ?Heart: S1 an S2; No murmurs, rubs, or gallops ?Lungs: Clear anteriorly and laterally. No wheezing, rales, or rhonchi. Breathing unlabored ?Abdomen: Soft and non-tender ?Extremities: Noted jerking movements BL upper extremities improved. No edema BLLE ?Dialysis Access: R AVF (+) B/T  ? ?Filed Weights  ? 01/10/22 1228 01/10/22 1640 01/12/22 0804  ?Weight: 87.1 kg 86.5 kg 80.3 kg  ? ?No intake or output data in the 24 hours ending 01/12/22 1204 ? ?Additional Objective ?Labs: ?Basic Metabolic Panel: ?Recent Labs  ?Lab 01/09/22 ?1739 01/10/22 ?4315 01/11/22 ?4008 01/12/22 ?0341  ?NA 137 135 136 133*  ?K 5.7* 6.2* 5.4* 4.8  ?CL 99 98 98 93*  ?CO2 '28 26 26 27  '$ ?GLUCOSE 95 122* 71 119*  ?BUN 59* 67* 28* 52*  ?CREATININE 6.61* 7.14* 4.22* 6.40*  ?CALCIUM 9.1 8.5* 8.8* 8.8*  ?PHOS 5.8*  --  5.6* 6.8*  ? ?Liver Function Tests: ?Recent Labs  ?Lab 01/09/22 ?1739 01/10/22 ?6761 01/12/22 ?0341  ?AST  --  9*  --   ?ALT  --  <5  --   ?ALKPHOS  --  115  --   ?BILITOT  --  0.7  --   ?PROT  --  6.2*  --   ?ALBUMIN 3.0* 2.9* 2.8*  ? ?No results for input(s): LIPASE, AMYLASE in the last 168 hours. ?CBC: ?Recent Labs  ?Lab 01/09/22 ?1141 01/09/22 ?1739 01/10/22 ?9509 01/11/22 ?3267 01/12/22 ?0341  ?WBC 16.3* 15.7* 14.1* 14.0* 11.5*  ?NEUTROABS  --   --   --  11.6* 8.6*  ?HGB 11.9* 11.8* 11.1* 10.9*  10.7*  ?HCT 38.5 37.7 36.0 35.4* 34.4*  ?MCV 95.5 95.2 95.5 96.5 96.1  ?PLT 236 229 228 227 231  ? ?Blood Culture ?   ?Component Value Date/Time  ? SDES FLUID LEFT PLEURAL 04/06/2017 1248  ? SDES FLUID LEFT PLEURAL 04/06/2017 1248  ? SPECREQUEST NONE 04/06/2017 1248  ? SPECREQUEST NONE 04/06/2017 1248  ? CULT NO GROWTH 5 DAYS 04/06/2017 1248  ? REPTSTATUS 04/11/2017 FINAL 04/06/2017 1248  ? REPTSTATUS 04/08/2017 FINAL 04/06/2017 1248  ? ? ?Cardiac Enzymes: ?No results for input(s): CKTOTAL, CKMB, CKMBINDEX, TROPONINI in the last 168 hours. ?CBG: ?Recent Labs  ?Lab 01/11/22 ?0808 01/11/22 ?1129 01/11/22 ?1624 01/11/22 ?2114 01/12/22 ?0743  ?GLUCAP 68* 181* 134* 240* 184*  ? ?Iron Studies: No results for input(s): IRON, TIBC, TRANSFERRIN, FERRITIN in the last 72 hours. ?Lab Results  ?Component Value Date  ? INR 1.5 (H) 01/09/2022  ? INR 1.65 03/30/2017  ? INR 1.26 03/13/2017  ? ?Studies/Results: ?No results found. ? ?Medications: ? sodium chloride    ? sodium chloride    ? cefTRIAXone (ROCEPHIN)  IV    ? ? acetaminophen  1,000 mg Oral TID  ? allopurinol  100 mg Oral Daily  ? antiseptic oral rinse  15 mL Mouth Rinse Q2H while awake  ? apixaban  2.5 mg Oral BID  ? atorvastatin  20 mg Oral Daily  ? calcium acetate  667 mg Oral TID WC  ? carbidopa-levodopa  2 tablet Oral Daily  ? Chlorhexidine Gluconate Cloth  6 each Topical Q0600  ? docusate sodium  200 mg Oral QHS  ? fluticasone  2 spray Each Nare Daily  ? insulin aspart  0-15 Units Subcutaneous TID WC  ? insulin aspart  0-5 Units Subcutaneous QHS  ? insulin NPH Human  5 Units Subcutaneous BID AC & HS  ? lip balm   Topical BID  ? loratadine  10 mg Oral Daily  ? midodrine  10 mg Oral Once per day on Mon Wed Fri  ? And  ? midodrine  5 mg Oral Once per day on Mon Wed Fri  ? predniSONE  2.5 mg Oral Daily  ? rOPINIRole  4 mg Oral TID  ? senna-docusate  2 tablet Oral QHS  ? sertraline  25 mg Oral Daily  ? ? ?Dialysis Orders: ?MWF- [facility] ?4hrs87mn, BFR 400,  DFR 500,  EDW 81.1kg, 3K/ 2Ca ?Heparin None ordered ?Venofer '100mg'$  IV X 5 doses-3rd dose given 01/07/22 ?Calcitriol 0.258mmcg PO qHD-last dose 01/07/22 ?Home meds: Midodrine '10mg'$  3x/weekly-give 1557mpre-HD ? ?Assessment/Plan: ?T8 Vertebral Fracture-CT and MRI completed: Reviewed Neurosurgery's  note: no column/posterior involvement or stenosis. No surgical intervention at this time. TLSO brace ordered. Continue pain management. Preferred agents for pain control are Fentanyl and Dilaudid. Morphine should not be used. No jerking movements noted today. Gabapentin now stopped. Spoke with Primary: continue low-dose Oxycodone for now. Monitor symptoms closely. ?Possible urinary tract infection-spoke with bedside RN who inform me straight cath was performed and noted brown/cloudy urine. U/A with urine culture pending-managed by primary. ?ESRD -  On HD MWF; Lokelma given yesterday. K+ now 4.8. Monitor K+ trends. Unclear if these weights are accurate. No evidence of volume overload on exam. HD today. UF as tolerated. Patient will need to tolerate HD in chair upon discharge.  ?Hypertension/volume  - BP soft/stable, continue Midodrine. No evidence of volume overload, UF as tolerated. ?Anemia of CKD - Hgb 10.7, no Fe/ESA at this time. Recently received Venofer in outpatient ?Secondary Hyperparathyroidism - Continue Hectorol and binders ?Nutrition - Now on renal diet with fluid restriction ?Dispo: Inpatient. Palliative care consulted to establish goals of care ? ?CouTobie PoetP ?[facility] ?01/12/2022,12:04 PM ? LOS: 3 days  ?  ?

## 2022-01-12 NOTE — Evaluation (Signed)
Physical Therapy Evaluation ?Patient Details ?Name: Jean Mcclain ?MRN: [id number] ?DOB: August 14, 1946 ?Today's Date: 01/12/2022 ? ?History of Present Illness ? 76 y.o. female presents to [facility] hospital on 01/09/2022 with complaints of back pain and chronic LE weakness. Pt found to have acute nondisplaced T8 fx. PMH includes DMII, HLD, ESRD, COPD, parkinson's disease.  ?Clinical Impression ? Pt presents to PT with deficits in functional mobility, balance, power, activity tolerance. Pt with chronic deficits in mobility, typically utilizing a power wheelchair and unable to ambulate. Pt reports she has been practicing squat pivot transfers with PT at SNF, and was transferring into the shower with some staff assistance prior to increase in back pain ~2 weeks ago, pt otherwise utilizes a hoyer lift to transfer out of bed. Pt currently requires physical assistance to roll and is dependent for bed mobility. Pt tolerates hoyer transfer well this session. Pt will benefit from further acute PT services in an effort to reduce dependence in bed mobility and to progress to further transfer training. PT recommends return to SNF.   ?   ? ?Recommendations for follow up therapy are one component of a multi-disciplinary discharge planning process, led by the attending physician.  Recommendations may be updated based on patient status, additional functional criteria and insurance authorization. ? ?Follow Up Recommendations Skilled nursing-short term rehab (<3 hours/day) ? ?  ?Assistance Recommended at Discharge Intermittent Supervision/Assistance  ?Patient can return home with the following ? Two people to help with walking and/or transfers;Two people to help with bathing/dressing/bathroom;Assistance with cooking/housework;Assistance with feeding;Direct supervision/assist for medications management;Direct supervision/assist for financial management;Assist for transportation;Help with stairs or ramp for entrance ? ?  ?Equipment Recommendations  None recommended by PT (facility owns necessary DME)  ?Recommendations for Other Services ?    ?  ?Functional Status Assessment Patient has had a recent decline in their functional status and demonstrates the ability to make significant improvements in function in a reasonable and predictable amount of time.  ? ?  ?Precautions / Restrictions Precautions ?Precautions: Fall;Back ?Precaution Booklet Issued: No ?Precaution Comments: verbally reviewed back precautions ?Required Braces or Orthoses: Spinal Brace ?Spinal Brace: Thoracolumbosacral orthotic;Applied in supine position ?Restrictions ?Weight Bearing Restrictions: No  ? ?  ? ?Mobility ? Bed Mobility ?Overal bed mobility: Needs Assistance ?Bed Mobility: Rolling ?Rolling: Max assist ?  ?  ?  ?  ?General bed mobility comments: pt rolls left and right twice to assist in donning TLSO and for placement of hoyer lift sling ?  ? ?Transfers ?Overall transfer level: Needs assistance ?  ?Transfers: Bed to chair/wheelchair/BSC ?  ?  ?  ?  ?  ?  ?  ?Transfer via Lift Equipment: [place] ? ?Ambulation/Gait ?  ?  ?  ?  ?  ?  ?  ?  ? ?Stairs ?  ?  ?  ?  ?  ? ?Wheelchair Mobility ?  ? ?Modified Rankin (Stroke Patients Only) ?  ? ?  ? ?Balance   ?  ?  ?  ?  ?  ?  ?  ?  ?  ?  ?  ?  ?  ?  ?  ?  ?  ?  ?   ? ? ? ?Pertinent Vitals/Pain Pain Assessment ?Pain Assessment: 0-10 ?Pain Score: 10-Worst pain ever ?Pain Location: low back ?Pain Descriptors / Indicators: Grimacing ?Pain Intervention(s): Monitored during session  ? ? ?Home Living Family/patient expects to be discharged to:: Skilled nursing facility ?  ?  ?  ?  ?  ?  ?  ?  ?  ?   ?  ?  Prior Function Prior Level of Function : Needs assist ?  ?  ?  ?  ?  ?  ?Mobility Comments: pt required assistance for all bed mobility and transfers, utilizing a hoyer lift for all transfers the last 2 weeks. Pt reports she had been working on squat pivot transfers and standing with PT prior to increase in back pain 2 weeks prior to this  admission ?ADLs Comments: pt requires assistance with bathing, dressing, toileting ?  ? ? ?Hand Dominance  ?   ? ?  ?Extremity/Trunk Assessment  ? Upper Extremity Assessment ?Upper Extremity Assessment: Generalized weakness;RUE deficits/detail;LUE deficits/detail ?RUE Deficits / Details: elbow ROM WFL, grossly impaired shoulder ROM ?LUE Deficits / Details: elbow ROM WFL, grossly impaired shoulder ROM ?  ? ?Lower Extremity Assessment ?Lower Extremity Assessment: Generalized weakness ?  ? ?Cervical / Trunk Assessment ?Cervical / Trunk Assessment: Kyphotic  ?Communication  ? Communication: No difficulties  ?Cognition Arousal/Alertness: Awake/alert ?Behavior During Therapy: [facility] for tasks assessed/performed ?Overall Cognitive Status: Within Functional Limits for tasks assessed ?  ?  ?  ?  ?  ?  ?  ?  ?  ?  ?  ?  ?  ?  ?  ?  ?  ?  ?  ? ?  ?General Comments General comments (skin integrity, edema, etc.): VSS, pt on 2L [place] ? ?  ?Exercises    ? ?Assessment/Plan  ?  ?PT Assessment Patient needs continued PT services  ?PT Problem List Decreased strength;Decreased range of motion;Decreased activity tolerance;Decreased balance;Decreased mobility;Decreased knowledge of precautions;Pain ? ?   ?  ?PT Treatment Interventions DME instruction;Functional mobility training;Therapeutic activities;Therapeutic exercise;Balance training;Neuromuscular re-education;Cognitive remediation;Patient/family education;Wheelchair mobility training   ? ?PT Goals (Current goals can be found in the Care Plan section)  ?Acute Rehab PT Goals ?Patient Stated Goal: to reduce pain with transfers ?PT Goal Formulation: With patient ?Time For Goal Achievement: 01/26/22 ?Potential to Achieve Goals: Fair ? ?  ?Frequency Min 2X/week ?  ? ? ?Co-evaluation   ?  ?  ?  ?  ? ? ?  ?AM-PAC PT "6 Clicks" Mobility  ?Outcome Measure Help needed turning from your back to your side while in a flat bed without using bedrails?: A Lot ?Help needed moving from lying on your back  to sitting on the side of a flat bed without using bedrails?: Total ?Help needed moving to and from a bed to a chair (including a wheelchair)?: Total ?Help needed standing up from a chair using your arms (e.g., wheelchair or bedside chair)?: Total ?Help needed to walk in hospital room?: Total ?Help needed climbing 3-5 steps with a railing? : Total ?6 Click Score: 7 ? ?  ?End of Session Equipment Utilized During Treatment: Back brace ?Activity Tolerance: Patient tolerated treatment well ?Patient left: in chair;with call bell/phone within reach;with chair alarm set ?Nurse Communication: Mobility status;Need for lift equipment ?PT Visit Diagnosis: Other abnormalities of gait and mobility (R26.89);Muscle weakness (generalized) (M62.81);Pain ?Pain - part of body:  (back) ?  ? ?Time: 5993-5701 ?PT Time Calculation (min) (ACUTE ONLY): 49 min ? ? ?Charges:   PT Evaluation ?$PT Eval Low Complexity: 1 Low ?PT Treatments ?$Therapeutic Activity: 8-22 mins ?  ?   ? ? ?Zenaida Niece, PT, DPT ?Acute Rehabilitation ?Pager: [phone] ?Office [phone] ? ? ?Zenaida Niece ?01/12/2022, 5:44 PM ? ?

## 2022-01-12 NOTE — Progress Notes (Signed)
?PROGRESS NOTE ? ?[place]  ?DOB: [birth year]  ?PCP: Donnajean Lopes, MD ?SEG:[id number]  ?DOA: 01/09/2022 ? LOS: 3 days  ?Hospital Day: 4 ? ?Brief narrative: ?Jean Mcclain is a 76 y.o. female with PMH significant for ESRD-HD-MWF, DM2, HTN, HLD, obesity, OSA, COPD on chronic nightly O2, vitamin D deficiency, venous stasis ulcer ?Patient presented to the ED on 4/2 with complaint of back pain. ?Per history, patient has chronic bilateral leg weakness, essentially nonambulatory and gets moved by a [employer] lift.  1 week ago, while being moved with a [employer] lift, she apparently was rolled up into a fetal position after which she started having back pain not controlled with outpatient meds and is brought to the ED. ? ?CT thoracic spine showed lucency through the osteophytes at the inferior aspect of T8 ?extending into the inferior aspect of the vertebral body, concerning for an atypical fracture in the setting of DISH. ?CT lumbar spine did not show any acute fracture or traumatic listhesis in the spine. ?Admitted to hospitalist service ?Neurosurgery consulted  ?MRI spine was obtained which showed an acute nondisplaced fracture through the inferior endplate and overlying osteophyte at T8 with mild overlying prevertebral soft tissue edema, diffuse idiopathic skeletal hyperostosis. ? ?Subjective: ?Patient was seen and examined this morning. ?Elderly Caucasian female.  In dialysis.  Seems anxious.  States that she is not able to think through.  She states that at the ALF, she was taking gabapentin at bedtime only occasionally. ?For pain medicine, currently she is getting IV fentanyl as needed but wants to avoid oxycodone because of the side effect profile.  We discussed about the comparison of the 2.  He agrees to stop fentanyl and try oxycodone. ? ? ?Principal Problem: ?  Thoracic spine fracture ([place]) ?Active Problems: ?  Insulin-requiring or dependent type II diabetes mellitus ([place]) ?  Essential hypertension ?  Kidney  lesion ?  OSA (obstructive sleep apnea) ?  ESRD on hemodialysis ([place]) ?  Intractable pain ?  GERD (gastroesophageal reflux disease) ?  HLD (hyperlipidemia) ?  Hyperkalemia ?  Anxiety ?  History of DVT (deep vein thrombosis) ?  Gout ?  ?Assessment and Plan: ?Acute nondisplaced T8 Fracture ?Intractable back pain ?Chronic immobility ?-CT scan and MRI finding as above.  ?-No surgical intervention recommended by neurosurgery.  ?-Currently on pain meds.  Stop IV fentanyl.  Encourage minimal use of oral pain meds.   ?-Pending PT eval  ?-Palliative care consulted. ?  ?ESRD on HD ?Hypertension ?Hyperkalemia/hyperphosphatemia ?-Potassium level and magnesium level remains high.  ESRD patient. ?-Euvolemic.  Remains on midodrine. ?Recent Labs  ?Lab 01/09/22 ?1141 01/09/22 ?1739 01/10/22 ?7371 01/11/22 ?0626 01/12/22 ?0341  ?K 5.3* 5.7* 6.2* 5.4* 4.8  ?PHOS  --  5.8*  --  5.6* 6.8*  ? ?Muscle jitteriness ?-It seems PTA, patient was on gabapentin 300 mg at bedtime but only as needed.  She seems to be getting more than usual dose of gabapentin in the hospital.  I will stop gabapentin order. ?-Continue to monitor symptoms. ? ?Foul-smelling urine ?-4/4, urinalysis showed turbid yellow urine with moderate amount of hemoglobin, large leukocytes, many bacteria.  Pending urine culture.  I will start empirically on IV Rocephin. ? ?Type 2 diabetes mellitus ?Hypoglycemia ?-A1c 7.2 on 01/09/2022 ?-Home meds include Novolin twice daily 32 units and 12 units ?-Currently on sliding scale insulin and Accu-Cheks.  ?-Blood sugars running elevated close to 200.  I will resume her on Novolin 5 units twice daily. ?Recent Labs  ?  Lab 01/11/22 ?0808 01/11/22 ?1129 01/11/22 ?1624 01/11/22 ?2114 01/12/22 ?0743  ?GLUCAP 68* 181* 134* 240* 184*  ? ?COPD on nocturnal O2 supplementation ?-continue home regimen ?  ?Hx of DVT ?-continue eliquis ?  ?GERD ?-protonix ?  ?HLD ?-continue statin ?   ?Parkinson's disease ?Restless leg syndrome ?Anxiety ?-continue  Sinemet, Neurontin, hydroxyzine, Zoloft, Requip ?  ?Gout ?-Allopurinol, ?  ?Right kidney lesion ?-as described on CT. Consider renal US while she is inpt; let's get her back pain under control first ? ?Goals of care ?  Code Status: DNR  ? ? ?Mobility: Chronically immobile ? ?Nutritional status:  ?Body mass index is 34.57 kg/m?.  ?  ?  ? ? ? ? ?Diet:  ?Diet Order   ? ?       ?  Diet renal with fluid restriction Fluid restriction: 1200 mL Fluid; Room service appropriate? Yes with Assist; Fluid consistency: Thin  Diet effective now       ?  ? ?  ?  ? ?  ? ? ?DVT prophylaxis:  ?apixaban (ELIQUIS) tablet 2.5 mg Start: 01/09/22 2200 ?apixaban (ELIQUIS) tablet 2.5 mg   ?Antimicrobials: None ?Fluid: None ?Consultants: Nephrology, neurosurgery, palliative care ?Family Communication: None at bedside ? ?Status is: Inpatient ? ?Continue in-hospital care because: Pending placement. ?Level of care: Telemetry Medical  ? ?Dispo: The patient is from: ALF ?             Anticipated d/c is to: Pending clinical course ?             Patient currently is not medically stable to d/c. ?Difficult to place patient No ? ? ? ? ?Infusions:  ? sodium chloride    ? sodium chloride    ? cefTRIAXone (ROCEPHIN)  IV    ? ? ? ?Scheduled Meds: ? acetaminophen  1,000 mg Oral TID  ? allopurinol  100 mg Oral Daily  ? antiseptic oral rinse  15 mL Mouth Rinse Q2H while awake  ? apixaban  2.5 mg Oral BID  ? atorvastatin  20 mg Oral Daily  ? calcium acetate  667 mg Oral TID WC  ? carbidopa-levodopa  2 tablet Oral Daily  ? Chlorhexidine Gluconate Cloth  6 each Topical Q0600  ? docusate sodium  200 mg Oral QHS  ? fluticasone  2 spray Each Nare Daily  ? insulin aspart  0-15 Units Subcutaneous TID WC  ? insulin aspart  0-5 Units Subcutaneous QHS  ? insulin NPH Human  5 Units Subcutaneous BID AC & HS  ? lip balm   Topical BID  ? loratadine  10 mg Oral Daily  ? midodrine  10 mg Oral Once per day on Mon Wed Fri  ? And  ? midodrine  5 mg Oral Once per day on Mon Wed  Fri  ? predniSONE  2.5 mg Oral Daily  ? rOPINIRole  4 mg Oral TID  ? senna-docusate  2 tablet Oral QHS  ? sertraline  25 mg Oral Daily  ? ? ?PRN meds: ?sodium chloride, sodium chloride, albuterol, alteplase, heparin, lidocaine (PF), lidocaine-prilocaine, oxyCODONE, pentafluoroprop-tetrafluoroeth  ? ?Antimicrobials: ?Anti-infectives (From admission, onward)  ? ? Start     Dose/Rate Route Frequency Ordered Stop  ? 01/12/22 1115  cefTRIAXone (ROCEPHIN) 1 g in sodium chloride 0.9 % 100 mL IVPB       ? 1 g ?200 mL/hr over 30 Minutes Intravenous Every 24 hours 01/12/22 1111    ? ?  ? ? ?Objective: ?Vitals:  ?  01/12/22 1030 01/12/22 1100  ?BP: 116/80 139/64  ?Pulse: 82 90  ?Resp:    ?Temp:    ?SpO2:    ? ?No intake or output data in the 24 hours ending 01/12/22 1116 ? ?Filed Weights  ? 01/10/22 1228 01/10/22 1640 01/12/22 0804  ?Weight: 87.1 kg 86.5 kg 80.3 kg  ? ?Weight change:  ?Body mass index is 34.57 kg/m?.  ? ?Physical Exam: ?General exam: Pleasant, elderly Caucasian female.  Pain controlled at rest ?Skin: No rashes, lesions or ulcers. ?HEENT: Atraumatic, normocephalic, no obvious bleeding ?Lungs: Clear to auscultate bilaterally ?CVS: Regular rate and rhythm, no murmur ?GI/Abd soft, nontender, nondistended, positive ?CNS: Alert, awake, oriented to place and person, feels jittery ?Psychiatry: Anxious ?Extremities: No pedal edema, no calf tenderness ? ?Data Review: I have personally reviewed the laboratory data and studies available. ? ?F/u labs ordered ?Unresulted Labs (From admission, onward)  ? ?  Start     Ordered  ? 01/11/22 1216  Urine Culture  Once,   R       ?Question:  Indication  Answer:  Dysuria  ? 01/11/22 1215  ? 01/09/22 1108  Urinalysis, Routine w reflex microscopic In/Out Cath Urine  Once,   R       ? 01/09/22 1107  ? ?  ?  ? ?  ? ? ?Signed, ?Terrilee Croak, MD ?[facility] ?01/12/2022 ? ? ? ? ? ? ? ? ? ? ? ? ?

## 2022-01-13 ENCOUNTER — Inpatient Hospital Stay (HOSPITAL_COMMUNITY): Payer: HMO

## 2022-01-13 DIAGNOSIS — S22068A Other fracture of T7-T8 thoracic vertebra, initial encounter for closed fracture: Secondary | ICD-10-CM | POA: Diagnosis not present

## 2022-01-13 LAB — GLUCOSE, CAPILLARY
Glucose-Capillary: 106 mg/dL — ABNORMAL HIGH (ref 70–99)
Glucose-Capillary: 128 mg/dL — ABNORMAL HIGH (ref 70–99)
Glucose-Capillary: 139 mg/dL — ABNORMAL HIGH (ref 70–99)
Glucose-Capillary: 130 mg/dL — ABNORMAL HIGH (ref 70–99)

## 2022-01-13 MED ORDER — SODIUM CHLORIDE 0.9 % IV SOLN: 100.0000 mL | INTRAVENOUS | Status: DC | PRN

## 2022-01-13 MED ORDER — HEPARIN SODIUM (PORCINE) 1000 UNIT/ML DIALYSIS: 1000.0000 [IU] | INTRAMUSCULAR | Status: DC | PRN

## 2022-01-13 MED ORDER — LIDOCAINE HCL (PF) 1 % IJ SOLN: 5.0000 mL | INTRAMUSCULAR | Status: DC | PRN

## 2022-01-13 MED ORDER — ALTEPLASE 2 MG IJ SOLR: 2.0000 mg | Freq: Once | INTRAMUSCULAR | Status: DC | PRN

## 2022-01-13 MED ORDER — POLYETHYLENE GLYCOL 3350 17 G PO PACK
17.0000 g | PACK | Freq: Every day | ORAL | Status: DC
  Administered 2022-01-13 – 2022-01-16 (×4): 17 g via ORAL
  Filled 2022-01-13 (×4): qty 1

## 2022-01-13 MED ORDER — LIDOCAINE-PRILOCAINE 2.5-2.5 % EX CREA: 1.0000 "application " | TOPICAL_CREAM | CUTANEOUS | Status: DC | PRN

## 2022-01-13 MED ORDER — PENTAFLUOROPROP-TETRAFLUOROETH EX AERO: 1.0000 "application " | INHALATION_SPRAY | CUTANEOUS | Status: DC | PRN

## 2022-01-13 NOTE — TOC Initial Note (Signed)
Transition of Care (TOC) - Initial/Assessment Note  ? ? ?Patient Details  ?Name: Jean Mcclain ?MRN: [id number] ?Date of Birth: 12-10-1945 ? ?Transition of Care (TOC) CM/SW Contact:    ?Vinie Sill, LCSW ?Phone Number: ?01/13/2022, 12:15 PM ? ?Clinical Narrative:                 ? ?CSW met with patient at bedside. CSW introduced self and explained role. Patient confirmed she is from [facility]. CSW informed per MD she is stable for return today. ? ?CSW called [facility]  left message with Network engineer and voice mail with admitting Nurse Wells Guiles, to return call back. Waiting on call back ? ?Called [employer] insurance - requested authorization for PTAR- waiting on approval ? ?Expected Discharge Plan: Assisted Living ?Barriers to Discharge:  (waiting on return call from ALF) ? ? ?Patient Goals and CMS Choice ?  ?  ?  ? ?Expected Discharge Plan and Services ?Expected Discharge Plan: Assisted Living ?In-house Referral: Clinical Social Work ?  ?  ?Living arrangements for the past 2 months: [place] ?                ?  ?  ?  ?  ?  ?  ?  ?  ?  ?  ? ?Prior Living Arrangements/Services ?Living arrangements for the past 2 months: [place] ?Lives with:: Self, Facility Resident ?Patient language and need for interpreter reviewed:: No ?       ?  ?  ?  ?  ? ?Activities of Daily Living ?  ?  ? ?Permission Sought/Granted ?Permission sought to share information with : Family Supports ?Permission granted to share information with : Yes, Verbal Permission Granted ? Share Information with NAME: Annett Gula ?   ? Permission granted to share info w Relationship: brother ?   ? ?Emotional Assessment ?  ?  ?  ?Orientation: : Oriented to Self, Oriented to Place, Oriented to  Time, Oriented to Situation ?Alcohol / Substance Use: Not Applicable ?Psych Involvement: No (comment) ? ?Admission diagnosis:  Thoracic spine fracture ([place]) [S22.009A] ?Other closed fracture of eighth thoracic vertebra, initial  encounter ([place]) [S22.068A] ?Patient Active Problem List  ? Diagnosis Date Noted  ? Thoracic spine fracture ([place]) 01/09/2022  ? Hyperkalemia 01/09/2022  ? Anxiety 01/09/2022  ? History of DVT (deep vein thrombosis) 01/09/2022  ? Gout 01/09/2022  ? Postmenopausal vaginal bleeding   ? Acute blood loss anemia 06/25/2020  ? Acute on chronic respiratory failure with hypoxia and hypercapnia ([place]) 06/25/2020  ? Advanced care planning/counseling discussion   ? Goals of care, counseling/discussion   ? Obstructive sleep apnea   ? Primary osteoarthritis, left ankle and foot   ? Ankle fracture 06/14/2020  ? HLD (hyperlipidemia) 06/14/2020  ? Dyspnea, unspecified 02/05/2020  ? Intractable pain 01/24/2020  ? Bilateral shoulder pain 01/23/2020  ? Right knee pain 01/23/2020  ? Uncontrolled type 2 diabetes mellitus with hyperglycemia, with long-term current use of insulin ([place]) 01/23/2020  ? Pressure injury of skin 01/22/2020  ? Mild protein-calorie malnutrition ([place]) 12/16/2019  ? Personal history of anaphylaxis 12/05/2019  ? Pruritus, unspecified 12/05/2019  ? ESRD on hemodialysis ([place]) 11/20/2019  ? Myoclonus, segmental 11/20/2019  ? Allergy, unspecified, initial encounter 07/29/2019  ? Anemia in chronic kidney disease 07/15/2019  ? Coagulation defect, unspecified ([place]) 07/13/2019  ? Iron deficiency anemia, unspecified 07/11/2019  ? Encounter for immunization 07/06/2019  ? End stage renal disease ([place]) 07/02/2019  ? GERD (  gastroesophageal reflux disease) 07/02/2019  ? Low back pain 07/02/2019  ? BMI 34.0-34.9,adult 07/02/2019  ? Other specified disorders of bone density and structure, unspecified site 07/02/2019  ? Secondary hyperparathyroidism of renal origin ([place]) 07/02/2019  ? Unspecified osteoarthritis, unspecified site 07/02/2019  ? OSA (obstructive sleep apnea) 03/13/2019  ? Chronic acquired lymphedema 11/05/2018  ? Kidney lesion 03/30/2017  ? Acute deep vein thrombosis (DVT) of popliteal vein of left lower extremity ([place])  03/11/2017  ? Insulin-requiring or dependent type II diabetes mellitus ([place]) 03/11/2017  ? Essential hypertension 03/11/2017  ? Impingement syndrome of left ankle 01/31/2017  ? Posterior tibial tendinitis, left leg 01/31/2017  ? Incarcerated ventral hernia 01/02/2013  ? ?PCP:  Donnajean Lopes, MD ?Pharmacy:   ?[facility] [place] [address] ?[place] [address] ?[address] ?[place] [address] ?Phone: [phone] Fax: [phone] ? ?[facility] [id number] ?[place] ?[place] [address] ?Phone: [phone] Fax: [phone] ? ?[facility] [place], [place] - [address] ?[address] ?[address], [address] ?[place] [address] ?Phone: [phone] Fax: [phone] ? ?[facility] [place] ?[address] ?[address] ?[place] [address] ?Phone: [phone] Fax: [phone] ? ? ? ? ?Social Determinants of Health (SDOH) Interventions ?  ? ?Readmission Risk Interventions ? ?  01/25/2020  ?  8:31 AM  ?Readmission Risk Prevention Plan  ?Transportation Screening Complete  ?PCP or Specialist Appt within 5-7 Days Not Complete  ?Not Complete comments d/c to SNF  ?Home Care Screening Complete  ?Medication Review (RN CM) Referral to Pharmacy  ? ? ? ?

## 2022-01-13 NOTE — TOC Progression Note (Signed)
Transition of Care (TOC) - Progression Note  ? ? ?Patient Details  ?Name: LAIA WILEY ?MRN: [id number] ?Date of Birth: Jan 16, 1946 ? ?Transition of Care (TOC) CM/SW Contact  ?Vinie Sill, LCSW ?Phone Number: ?01/13/2022, 2:47 PM ? ?Clinical Narrative:    ? ?Called left voice message with [facility]- to return call, patient is medically stable for discharge. ? ?Approval for PTAR remains pending  ? ?Thurmond Butts, MSW, LCSW ?Clinical Social Worker ? ? ? ?Expected Discharge Plan: Assisted Living ?Barriers to Discharge:  (waiting on return call from ALF) ? ?Expected Discharge Plan and Services ?Expected Discharge Plan: Assisted Living ?In-house Referral: Clinical Social Work ?  ?  ?Living arrangements for the past 2 months: [place] ?                ?  ?  ?  ?  ?  ?  ?  ?  ?  ?  ? ? ?Social Determinants of Health (SDOH) Interventions ?  ? ?Readmission Risk Interventions ? ?  01/25/2020  ?  8:31 AM  ?Readmission Risk Prevention Plan  ?Transportation Screening Complete  ?PCP or Specialist Appt within 5-7 Days Not Complete  ?Not Complete comments d/c to SNF  ?Home Care Screening Complete  ?Medication Review (RN CM) Referral to Pharmacy  ? ? ?

## 2022-01-13 NOTE — TOC Progression Note (Signed)
Transition of Care (TOC) - Progression Note  ? ? ?Patient Details  ?Name: BELIA FEBO ?MRN: [id number] ?Date of Birth: 07-29-1946 ? ?Transition of Care (TOC) CM/SW Contact  ?Vinie Sill, LCSW ?Phone Number: ?01/13/2022, 4:29 PM ? ?Clinical Narrative:    ? ?[facility], spoke with Network engineer and again requested a call back.  ? ? ? ?Expected Discharge Plan: Assisted Living ?Barriers to Discharge:  (waiting on return call from ALF) ? ?Expected Discharge Plan and Services ?Expected Discharge Plan: Assisted Living ?In-house Referral: Clinical Social Work ?  ?  ?Living arrangements for the past 2 months: [place] ?                ?  ?  ?  ?  ?  ?  ?  ?  ?  ?  ? ? ?Social Determinants of Health (SDOH) Interventions ?  ? ?Readmission Risk Interventions ? ?  01/25/2020  ?  8:31 AM  ?Readmission Risk Prevention Plan  ?Transportation Screening Complete  ?PCP or Specialist Appt within 5-7 Days Not Complete  ?Not Complete comments d/c to SNF  ?Home Care Screening Complete  ?Medication Review (RN CM) Referral to Pharmacy  ? ? ?

## 2022-01-13 NOTE — Progress Notes (Signed)
On assessment Rocephin that was hung at 1651 was not unclamped so pt did not receive abx  ?

## 2022-01-13 NOTE — TOC Progression Note (Addendum)
Transition of Care (TOC) - Progression Note  ? ? ?Patient Details  ?Name: MALAYJA FREUND ?MRN: [id number] ?Date of Birth: 25-Oct-1945 ? ?Transition of Care (TOC) CM/SW Contact  ?Vinie Sill, LCSW ?Phone Number: ?01/13/2022, 4:43 PM ? ?Clinical Narrative:    ? ?Spoke with [facility]- they can accept patient tomorrow- requested to fax d/c summary and FL2 to [phone] ? ?PTAR approved # [id number] for 90 days  ? ? ? ?Expected Discharge Plan: Assisted Living ?Barriers to Discharge:  (waiting on return call from ALF) ? ?Expected Discharge Plan and Services ?Expected Discharge Plan: Assisted Living ?In-house Referral: Clinical Social Work ?  ?  ?Living arrangements for the past 2 months: [place] ?                ?  ?  ?  ?  ?  ?  ?  ?  ?  ?  ? ? ?Social Determinants of Health (SDOH) Interventions ?  ? ?Readmission Risk Interventions ? ?  01/25/2020  ?  8:31 AM  ?Readmission Risk Prevention Plan  ?Transportation Screening Complete  ?PCP or Specialist Appt within 5-7 Days Not Complete  ?Not Complete comments d/c to SNF  ?Home Care Screening Complete  ?Medication Review (RN CM) Referral to Pharmacy  ? ? ?

## 2022-01-13 NOTE — Progress Notes (Signed)
?PROGRESS NOTE ? ?[place]  ?DOB: [birth year]  ?PCP: Donnajean Lopes, MD ?GYF:[id number]  ?DOA: 01/09/2022 ? LOS: 4 days  ?Hospital Day: 5 ? ?Brief narrative: ?Jean Mcclain is a 76 y.o. female with PMH significant for ESRD-HD-MWF, DM2, HTN, HLD, obesity, OSA, COPD on chronic nightly O2, vitamin D deficiency, venous stasis ulcer ?Patient presented to the ED on 4/2 with complaint of back pain. ?Per history, patient has chronic bilateral leg weakness, essentially nonambulatory and gets moved by a [employer] lift.  1 week ago, while being moved with a [employer] lift, she apparently was rolled up into a fetal position after which she started having back pain not controlled with outpatient meds and is brought to the ED. ? ?CT thoracic spine showed lucency through the osteophytes at the inferior aspect of T8 ?extending into the inferior aspect of the vertebral body, concerning for an atypical fracture in the setting of DISH. ?CT lumbar spine did not show any acute fracture or traumatic listhesis in the spine. ?Admitted to hospitalist service ?Neurosurgery consulted  ?MRI spine was obtained which showed an acute nondisplaced fracture through the inferior endplate and overlying osteophyte at T8 with mild overlying prevertebral soft tissue edema, diffuse idiopathic skeletal hyperostosis. ? ?Subjective: ?Patient was seen and examined this morning. ?She feels anxious most of the times.  She is too scared to try any pain medicine because it makes her 'loopy'.  I had a long conversation with her.  She understands she needs some help for pain.  Wants to try low-dose oxycodone. ? ?Principal Problem: ?  Thoracic spine fracture ([place]) ?Active Problems: ?  Insulin-requiring or dependent type II diabetes mellitus ([place]) ?  Essential hypertension ?  Kidney lesion ?  OSA (obstructive sleep apnea) ?  ESRD on hemodialysis ([place]) ?  Intractable pain ?  GERD (gastroesophageal reflux disease) ?  HLD (hyperlipidemia) ?  Hyperkalemia ?  Anxiety ?   History of DVT (deep vein thrombosis) ?  Gout ?  ?Assessment and Plan: ?Acute nondisplaced T8 Fracture ?Intractable back pain ?Chronic immobility ?-CT scan and MRI finding as above.  ?-No surgical intervention recommended by neurosurgery.  ?-Continue scheduled Tylenol and as needed oxycodone. ?-PT eval and palliative care consult appreciated. ?  ?ESRD on HD ?Hypertension ?Hyperkalemia/hyperphosphatemia ?-Potassium level and magnesium level remains high.  ESRD patient. ?-Euvolemic.  Remains on midodrine. ?Recent Labs  ?Lab 01/09/22 ?1141 01/09/22 ?1739 01/10/22 ?9163 01/11/22 ?8466 01/12/22 ?0341  ?K 5.3* 5.7* 6.2* 5.4* 4.8  ?PHOS  --  5.8*  --  5.6* 6.8*  ? ?Muscle jitteriness ?-It seems PTA, patient was on gabapentin 300 mg at bedtime but only as needed.  She seems to be getting more than usual dose of gabapentin in the hospital.  Improving jitteriness after gabapentin was stopped. ?-Continue to monitor symptoms. ? ?Foul-smelling urine ?-4/4, urinalysis showed turbid yellow urine with moderate amount of hemoglobin, large leukocytes, many bacteria.  Multiple species seen in urine culture.  Currently on empiric IV Rocephin day 2 ? ?Type 2 diabetes mellitus ?Hypoglycemia ?-A1c 7.2 on 01/09/2022 ?-Home meds include Novolin twice daily 32 units and 12 units ?-Currently on Novolin 5 units twice daily and sliding scale insulin and Accu-Cheks.  ?-Blood sugars improving. ?Recent Labs  ?Lab 01/12/22 ?5993 01/12/22 ?1600 01/12/22 ?2112 01/13/22 ?0820 01/13/22 ?1152  ?GLUCAP 184* 148* 122* 106* 128*  ? ?COPD on nocturnal O2 supplementation ?-continue home regimen ?  ?Hx of DVT ?-continue eliquis ?  ?GERD ?-protonix ?  ?HLD ?-continue statin ?   ?  Parkinson's disease ?Restless leg syndrome ?Anxiety ?-continue Sinemet, Neurontin, hydroxyzine, Zoloft, Requip ?  ?Gout ?-Allopurinol, ?  ?Right kidney lesion ?-as described on CT. ordered renal ultrasound. ? ?Goals of care ?  Code Status: DNR  ? ? ?Mobility: Chronically  immobile ? ?Nutritional status:  ?Body mass index is 34.57 kg/m?.  ?  ?  ? ? ? ? ?Diet:  ?Diet Order   ? ?       ?  Diet renal with fluid restriction Fluid restriction: 1200 mL Fluid; Room service appropriate? Yes with Assist; Fluid consistency: Thin  Diet effective now       ?  ? ?  ?  ? ?  ? ? ?DVT prophylaxis:  ?apixaban (ELIQUIS) tablet 2.5 mg Start: 01/09/22 2200 ?apixaban (ELIQUIS) tablet 2.5 mg   ?Antimicrobials: None ?Fluid: None ?Consultants: Nephrology, neurosurgery, palliative care ?Family Communication: None at bedside ? ?Status is: Inpatient ? ?Continue in-hospital care because: Pending placement. ?Level of care: Telemetry Medical  ? ?Dispo: The patient is from: ALF ?             Anticipated d/c is to: ALF versus SNF.  Case worker following up with ALF ?             Patient currently is medically stable to d/c. ?Difficult to place patient No ? ? ? ? ?Infusions:  ? cefTRIAXone (ROCEPHIN)  IV 1 g (01/12/22 1651)  ? ? ? ?Scheduled Meds: ? acetaminophen  1,000 mg Oral TID  ? allopurinol  100 mg Oral Daily  ? apixaban  2.5 mg Oral BID  ? atorvastatin  20 mg Oral Daily  ? calcium acetate  667 mg Oral TID WC  ? carbidopa-levodopa  2 tablet Oral Daily  ? Chlorhexidine Gluconate Cloth  6 each Topical Q0600  ? docusate sodium  200 mg Oral QHS  ? fluticasone  2 spray Each Nare Daily  ? insulin aspart  0-15 Units Subcutaneous TID WC  ? insulin aspart  0-5 Units Subcutaneous QHS  ? insulin NPH Human  5 Units Subcutaneous BID AC & HS  ? lip balm   Topical BID  ? loratadine  10 mg Oral Daily  ? mouth rinse  15 mL Mouth Rinse Q2H while awake  ? midodrine  10 mg Oral Once per day on Mon Wed Fri  ? And  ? midodrine  5 mg Oral Once per day on Mon Wed Fri  ? polyethylene glycol  17 g Oral Daily  ? predniSONE  2.5 mg Oral Daily  ? rOPINIRole  4 mg Oral TID  ? senna-docusate  2 tablet Oral QHS  ? sertraline  25 mg Oral Daily  ? ? ?PRN meds: ?albuterol, oxyCODONE  ? ?Antimicrobials: ?Anti-infectives (From admission, onward)   ? ? Start     Dose/Rate Route Frequency Ordered Stop  ? 01/12/22 1400  cefTRIAXone (ROCEPHIN) 1 g in sodium chloride 0.9 % 100 mL IVPB       ? 1 g ?200 mL/hr over 30 Minutes Intravenous Every 24 hours 01/12/22 1111    ? ?  ? ? ?Objective: ?Vitals:  ? 01/13/22 0303 01/13/22 0825  ?BP: 98/63 (!) 103/51  ?Pulse: 63 72  ?Resp: 14 19  ?Temp: 97.6 ?F (36.4 ?C) 98.1 ?F (36.7 ?C)  ?SpO2: 99% 100%  ? ? ?Intake/Output Summary (Last 24 hours) at 01/13/2022 1423 ?Last data filed at 01/13/2022 0930 ?Gross per 24 hour  ?Intake 480 ml  ?Output --  ?Net 480 ml  ? ? ?  Filed Weights  ? 01/10/22 1228 01/10/22 1640 01/12/22 0804  ?Weight: 87.1 kg 86.5 kg 80.3 kg  ? ?Weight change:  ?Body mass index is 34.57 kg/m?.  ? ?Physical Exam: ?General exam: Pleasant, elderly Caucasian female.  Pain controlled at rest ?Skin: No rashes, lesions or ulcers. ?HEENT: Atraumatic, normocephalic, no obvious bleeding ?Lungs: Clear to auscultation bilaterally ?CVS: Regular rate and rhythm, no murmur ?GI/Abd soft, nontender, nondistended, positive ?CNS: Alert, awake, oriented to place and person, feels jittery ?Psychiatry: Always anxious  ?Extremities: No pedal edema, no calf tenderness ? ?Data Review: I have personally reviewed the laboratory data and studies available. ? ?F/u labs ordered ?Unresulted Labs (From admission, onward)  ? ?  Start     Ordered  ? 01/09/22 1108  Urinalysis, Routine w reflex microscopic In/Out Cath Urine  Once,   R       ? 01/09/22 1107  ? ?  ?  ? ?  ? ? ?Signed, ?Terrilee Croak, MD ?[facility] ?01/13/2022 ? ? ? ? ? ? ? ? ? ? ? ? ?

## 2022-01-13 NOTE — Progress Notes (Signed)
?[facility] ?Progress Note  ? ?Subjective:    ?Seen and examined patient at bedside. Tolerated yesterday's HD with net UF 1.7L. She reports she may be going home today. Plan for HD 01/14/22 if patient is still here. ? ?Objective ?Vitals:  ? 01/12/22 1937 01/12/22 2300 01/13/22 0303 01/13/22 0825  ?BP: 115/68 (!) 143/107 98/63 (!) 103/51  ?Pulse: 63 74 63 72  ?Resp: '16 18 14 19  '$ ?Temp: 98.4 ?F (36.9 ?C) 98.1 ?F (36.7 ?C) 97.6 ?F (36.4 ?C) 98.1 ?F (36.7 ?C)  ?TempSrc: Oral Oral Oral Oral  ?SpO2: 98% 91% 99% 100%  ?Weight:      ?Height:      ? ?Physical Exam ?General: Chronically ill-appearing; on 2L O2 [place]; NAD ?Heart: S1 an S2; No murmurs, rubs, or gallops ?Lungs: Clear anteriorly and laterally. No wheezing, rales, or rhonchi. Breathing unlabored ?Abdomen: Soft and non-tender ?Extremities: No edema BLLE ?Dialysis Access: R AVF (+) B/T  ?  ?Filed Weights  ? 01/10/22 1228 01/10/22 1640 01/12/22 0804  ?Weight: 87.1 kg 86.5 kg 80.3 kg  ? ? ?Intake/Output Summary (Last 24 hours) at 01/13/2022 1253 ?Last data filed at 01/13/2022 0325 ?Gross per 24 hour  ?Intake 240 ml  ?Output --  ?Net 240 ml  ? ? ?Additional Objective ?Labs: ?Basic Metabolic Panel: ?Recent Labs  ?Lab 01/09/22 ?1739 01/10/22 ?7829 01/11/22 ?5621 01/12/22 ?0341  ?NA 137 135 136 133*  ?K 5.7* 6.2* 5.4* 4.8  ?CL 99 98 98 93*  ?CO2 '28 26 26 27  '$ ?GLUCOSE 95 122* 71 119*  ?BUN 59* 67* 28* 52*  ?CREATININE 6.61* 7.14* 4.22* 6.40*  ?CALCIUM 9.1 8.5* 8.8* 8.8*  ?PHOS 5.8*  --  5.6* 6.8*  ? ?Liver Function Tests: ?Recent Labs  ?Lab 01/09/22 ?1739 01/10/22 ?3086 01/12/22 ?0341  ?AST  --  9*  --   ?ALT  --  <5  --   ?ALKPHOS  --  115  --   ?BILITOT  --  0.7  --   ?PROT  --  6.2*  --   ?ALBUMIN 3.0* 2.9* 2.8*  ? ?No results for input(s): LIPASE, AMYLASE in the last 168 hours. ?CBC: ?Recent Labs  ?Lab 01/09/22 ?1141 01/09/22 ?1739 01/10/22 ?5784 01/11/22 ?6962 01/12/22 ?0341  ?WBC 16.3* 15.7* 14.1* 14.0* 11.5*  ?NEUTROABS  --   --   --  11.6* 8.6*  ?HGB 11.9*  11.8* 11.1* 10.9* 10.7*  ?HCT 38.5 37.7 36.0 35.4* 34.4*  ?MCV 95.5 95.2 95.5 96.5 96.1  ?PLT 236 229 228 227 231  ? ?Blood Culture ?   ?Component Value Date/Time  ? SDES URINE, CLEAN CATCH 01/11/2022 1216  ? SPECREQUEST  01/11/2022 1216  ?  NONE ?Performed at [facility], [place] [address], [place], [place] [address] ?  ? CULT MULTIPLE SPECIES PRESENT, SUGGEST RECOLLECTION (A) 01/11/2022 1216  ? REPTSTATUS 01/12/2022 FINAL 01/11/2022 1216  ? ? ?Cardiac Enzymes: ?No results for input(s): CKTOTAL, CKMB, CKMBINDEX, TROPONINI in the last 168 hours. ?CBG: ?Recent Labs  ?Lab 01/12/22 ?1324 01/12/22 ?1600 01/12/22 ?2112 01/13/22 ?0820 01/13/22 ?1152  ?GLUCAP 184* 148* 122* 106* 128*  ? ?Iron Studies: No results for input(s): IRON, TIBC, TRANSFERRIN, FERRITIN in the last 72 hours. ?Lab Results  ?Component Value Date  ? INR 1.5 (H) 01/09/2022  ? INR 1.65 03/30/2017  ? INR 1.26 03/13/2017  ? ?Studies/Results: ?No results found. ? ?Medications: ? cefTRIAXone (ROCEPHIN)  IV 1 g (01/12/22 1651)  ? ? acetaminophen  1,000 mg Oral TID  ?  allopurinol  100 mg Oral Daily  ? apixaban  2.5 mg Oral BID  ? atorvastatin  20 mg Oral Daily  ? calcium acetate  667 mg Oral TID WC  ? carbidopa-levodopa  2 tablet Oral Daily  ? Chlorhexidine Gluconate Cloth  6 each Topical Q0600  ? docusate sodium  200 mg Oral QHS  ? fluticasone  2 spray Each Nare Daily  ? insulin aspart  0-15 Units Subcutaneous TID WC  ? insulin aspart  0-5 Units Subcutaneous QHS  ? insulin NPH Human  5 Units Subcutaneous BID AC & HS  ? lip balm   Topical BID  ? loratadine  10 mg Oral Daily  ? mouth rinse  15 mL Mouth Rinse Q2H while awake  ? midodrine  10 mg Oral Once per day on Mon Wed Fri  ? And  ? midodrine  5 mg Oral Once per day on Mon Wed Fri  ? polyethylene glycol  17 g Oral Daily  ? predniSONE  2.5 mg Oral Daily  ? rOPINIRole  4 mg Oral TID  ? senna-docusate  2 tablet Oral QHS  ? sertraline  25 mg Oral Daily  ? ? ?Dialysis Orders: ?MWF- [facility] ?4hrs5mn, BFR 400, DFR 500,  EDW 81.1kg, 3K/ 2Ca ?Heparin None ordered ?Venofer '100mg'$  IV X 5 doses-3rd dose given 01/07/22 ?Calcitriol 0.239mmcg PO qHD-last dose 01/07/22 ?Home meds: Midodrine '10mg'$  3x/weekly-give 1523mpre-HD ? ?Assessment/Plan: ?T8 Vertebral Fracture-CT and MRI completed: Reviewed Neurosurgery's  note: no column/posterior involvement or stenosis. No surgical intervention at this time. TLSO brace ordered. Continue pain management: Preferred agents for pain control are Fentanyl and Dilaudid. Morphine should not be used. Patient reports concern taking Oxycodone d/t medication causes her to feel "out of it". She reports only taking this medication occasionally at ALF because of this issue. Reviewed last palliative care note: she doesn't want to trial scheduled narcotics here. Continue scheduled Tylenol and PRN Oxycodone. ?Possible urinary tract infection-spoke with bedside RN who inform me straight cath was performed and noted brown/cloudy urine. U/A with urine culture showed multiple specimens and recommend repeat. She remains afebrile, WBC trending down-managed by primary. ?ESRD -  On HD MWF; Hyperkalemic at admit-now resolved. Unclear if these weights are accurate. No evidence of volume overload on exam. HD 01/14/22 if patient is still here. Will also place patient in the chair for HD. UF as tolerated.  ?Hypertension/volume  - BP soft/stable, continue Midodrine. No evidence of volume overload, UF as tolerated. ?Anemia of CKD - Hgb 10.7, no Fe/ESA at this time. Recently received Venofer in outpatient ?Secondary Hyperparathyroidism - Continue Hectorol and binders ?Nutrition - Now on renal diet with fluid restriction ?Dispo: Inpatient. Palliative care consulted to establish goals of care: Patient is now a DNR ?  ? ?CouTobie PoetP ?[facility] ?01/13/2022,12:53 PM ? LOS: 4 days  ?  ?

## 2022-01-14 ENCOUNTER — Encounter (HOSPITAL_COMMUNITY): Payer: Self-pay | Admitting: Internal Medicine

## 2022-01-14 LAB — BASIC METABOLIC PANEL
Anion gap: 13 (ref 5–15)
BUN: 57 mg/dL — ABNORMAL HIGH (ref 8–23)
CO2: 25 mmol/L (ref 22–32)
Calcium: 8.7 mg/dL — ABNORMAL LOW (ref 8.9–10.3)
Chloride: 97 mmol/L — ABNORMAL LOW (ref 98–111)
Creatinine, Ser: 7.3 mg/dL — ABNORMAL HIGH (ref 0.44–1.00)
GFR, Estimated: 5 mL/min — ABNORMAL LOW (ref >60)
Glucose, Bld: 77 mg/dL (ref 70–99)
Potassium: 5 mmol/L (ref 3.5–5.1)
Sodium: 135 mmol/L (ref 135–145)

## 2022-01-14 LAB — GLUCOSE, CAPILLARY
Glucose-Capillary: 72 mg/dL (ref 70–99)
Glucose-Capillary: 128 mg/dL — ABNORMAL HIGH (ref 70–99)
Glucose-Capillary: 121 mg/dL — ABNORMAL HIGH (ref 70–99)

## 2022-01-14 LAB — CBC
HCT: 34.8 % — ABNORMAL LOW (ref 36.0–46.0)
Hemoglobin: 10.5 g/dL — ABNORMAL LOW (ref 12.0–15.0)
MCH: 29.2 pg (ref 26.0–34.0)
MCHC: 30.2 g/dL (ref 30.0–36.0)
MCV: 96.7 fL (ref 80.0–100.0)
Platelets: 249 10*3/uL (ref 150–400)
RBC: 3.6 MIL/uL — ABNORMAL LOW (ref 3.87–5.11)
RDW: 14.4 % (ref 11.5–15.5)
WBC: 13.3 10*3/uL — ABNORMAL HIGH (ref 4.0–10.5)
nRBC: 0 % (ref 0.0–0.2)

## 2022-01-14 MED ORDER — OXYCODONE HCL 5 MG PO TABS: 5.0000 mg | ORAL_TABLET | Freq: Four times a day (QID) | ORAL | 0 refills | Status: DC | PRN

## 2022-01-14 MED ORDER — INSULIN NPH (HUMAN) (ISOPHANE) 100 UNIT/ML ~~LOC~~ SUSP: 5.0000 [IU] | Freq: Two times a day (BID) | SUBCUTANEOUS | 11 refills | Status: DC

## 2022-01-14 MED ORDER — SODIUM CHLORIDE 0.9 % IV SOLN
0.5000 mg/h | Freq: Once | INTRAVENOUS | Status: DC
  Filled 2022-01-14: qty 5

## 2022-01-14 MED ORDER — ACETAMINOPHEN 500 MG PO TABS: 1000.0000 mg | ORAL_TABLET | Freq: Three times a day (TID) | ORAL | 0 refills | Status: DC

## 2022-01-14 MED ORDER — SENNOSIDES-DOCUSATE SODIUM 8.6-50 MG PO TABS: 2.0000 | ORAL_TABLET | Freq: Every day | ORAL | Status: DC

## 2022-01-14 MED ORDER — HYDROMORPHONE HCL 1 MG/ML IJ SOLN
INTRAMUSCULAR | Status: AC
  Administered 2022-01-14: 0.5 mg via INTRAVENOUS
  Filled 2022-01-14: qty 0.5

## 2022-01-14 MED ORDER — INSULIN ASPART 100 UNIT/ML IJ SOLN: 0.0000 [IU] | Freq: Three times a day (TID) | INTRAMUSCULAR | 11 refills | Status: DC

## 2022-01-14 MED ORDER — OXYCODONE HCL 5 MG PO TABS: 5.0000 mg | ORAL_TABLET | Freq: Four times a day (QID) | ORAL | 0 refills | Status: AC | PRN

## 2022-01-14 MED ORDER — LIDOCAINE 4 % EX PTCH: 1.0000 | MEDICATED_PATCH | Freq: Every day | CUTANEOUS | 0 refills | Status: AC | PRN

## 2022-01-14 MED ORDER — INSULIN ASPART 100 UNIT/ML IJ SOLN: 0.0000 [IU] | Freq: Every day | INTRAMUSCULAR | 11 refills | Status: DC

## 2022-01-14 MED ORDER — HYDROMORPHONE HCL 1 MG/ML IJ SOLN
0.5000 mg | INTRAMUSCULAR | Status: DC | PRN
  Administered 2022-01-14 – 2022-01-16 (×2): 0.5 mg via INTRAVENOUS
  Filled 2022-01-14 (×2): qty 0.5

## 2022-01-14 NOTE — TOC Progression Note (Addendum)
Transition of Care (TOC) - Progression Note  ? ? ?Patient Details  ?Name: Jean Mcclain ?MRN: [id number] ?Date of Birth: 08-09-1946 ? ?Transition of Care (TOC) CM/SW Contact  ?Vinie Sill, LCSW ?Phone Number: ?01/14/2022, 3:22 PM ? ?Clinical Narrative:    ? ?Patient will Discharge to: [facility] ?Discharge Date: 01/14/2022 ?Family Notified: brother ?Transport [id number] @ 6:30pm ? ?CSW informed RN of Nephrology recommendations before discharge. ? ?Per MD patient is ready for discharge. RN, patient, and facility notified of discharge. Discharge Summary sent to facility. RN given number for [phone]. Ambulance transport requested for patient.  ? ?Clinical Social Worker signing off. ? ?Thurmond Butts, MSW, LCSW ?Clinical Social Worker ? ? ? ? ? ?Expected Discharge Plan: Assisted Living ?Barriers to Discharge: Barriers Resolved ? ?Expected Discharge Plan and Services ?Expected Discharge Plan: Assisted Living ?In-house Referral: Clinical Social Work ?  ?  ?Living arrangements for the past 2 months: [place] ?Expected Discharge Date: 01/14/22               ?  ?  ?  ?  ?  ?  ?  ?  ?  ?  ? ? ?Social Determinants of Health (SDOH) Interventions ?  ? ?Readmission Risk Interventions ? ?  01/25/2020  ?  8:31 AM  ?Readmission Risk Prevention Plan  ?Transportation Screening Complete  ?PCP or Specialist Appt within 5-7 Days Not Complete  ?Not Complete comments d/c to SNF  ?Home Care Screening Complete  ?Medication Review (RN CM) Referral to Pharmacy  ? ? ?

## 2022-01-14 NOTE — Progress Notes (Signed)
Contacted [facility] and spoke to [place]. Clinic advised of pt's d/c today and that pt should resume on Monday. Communicated with treatment team via secure chat regarding pt's d/c today. Nephrologist recommended that pt sit in a chair for a few hrs after HD prior to d/c to make sure pt is able to tolerate sitting in chair.  ? ?Melven Sartorius ?Renal Navigator ?[phone] ?

## 2022-01-14 NOTE — [place] FL2 (Addendum)
?[place] MEDICA[place] FL2 LEVEL OF CARE SCREENING TOOL  ?  ? ?IDENTIFICATION  ?Patient Name: ?Jean Mcclain Birthdate: 1946-07-12 Sex: female Admission Date (Current Location): ?01/09/2022  ?[place] and [place] Number: ? [place] ?  Facility and Address:  ?[facility], [place] [address] [place], [place] [address] ?     Provider Number: ?[id number]  ?Attending Physician Name and Address:  ?Terrilee Croak, MD ? Relative Name and Phone Number:  ?  ?   ?Current Level of Care: ?Other (Comment) (ALF) Recommended Level of Care: ?Assisted Living Facility Prior Approval Number: ?  ? ?Date Approved/Denied: ?  PASRR Number: ?  ? ?Discharge Plan: ?Domiciliary (Rest home) (ALF) ?  ? ?Current Diagnoses: ?Patient Active Problem List  ? Diagnosis Date Noted  ? Thoracic spine fracture ([place]) 01/09/2022  ? Hyperkalemia 01/09/2022  ? Anxiety 01/09/2022  ? History of DVT (deep vein thrombosis) 01/09/2022  ? Gout 01/09/2022  ? Postmenopausal vaginal bleeding   ? Acute blood loss anemia 06/25/2020  ? Acute on chronic respiratory failure with hypoxia and hypercapnia ([place]) 06/25/2020  ? Advanced care planning/counseling discussion   ? Goals of care, counseling/discussion   ? Obstructive sleep apnea   ? Primary osteoarthritis, left ankle and foot   ? Ankle fracture 06/14/2020  ? HLD (hyperlipidemia) 06/14/2020  ? Dyspnea, unspecified 02/05/2020  ? Intractable pain 01/24/2020  ? Bilateral shoulder pain 01/23/2020  ? Right knee pain 01/23/2020  ? Uncontrolled type 2 diabetes mellitus with hyperglycemia, with long-term current use of insulin ([place]) 01/23/2020  ? Pressure injury of skin 01/22/2020  ? Mild protein-calorie malnutrition ([place]) 12/16/2019  ? Personal history of anaphylaxis 12/05/2019  ? Pruritus, unspecified 12/05/2019  ? ESRD on hemodialysis ([place]) 11/20/2019  ? Myoclonus, segmental 11/20/2019  ? Allergy, unspecified, initial encounter 07/29/2019  ? Anemia in chronic kidney disease 07/15/2019  ? Coagulation defect,  unspecified ([place]) 07/13/2019  ? Iron deficiency anemia, unspecified 07/11/2019  ? Encounter for immunization 07/06/2019  ? End stage renal disease ([place]) 07/02/2019  ? GERD (gastroesophageal reflux disease) 07/02/2019  ? Low back pain 07/02/2019  ? BMI 34.0-34.9,adult 07/02/2019  ? Other specified disorders of bone density and structure, unspecified site 07/02/2019  ? Secondary hyperparathyroidism of renal origin ([place]) 07/02/2019  ? Unspecified osteoarthritis, unspecified site 07/02/2019  ? OSA (obstructive sleep apnea) 03/13/2019  ? Chronic acquired lymphedema 11/05/2018  ? Kidney lesion 03/30/2017  ? Acute deep vein thrombosis (DVT) of popliteal vein of left lower extremity ([place]) 03/11/2017  ? Insulin-requiring or dependent type II diabetes mellitus ([place]) 03/11/2017  ? Essential hypertension 03/11/2017  ? Impingement syndrome of left ankle 01/31/2017  ? Posterior tibial tendinitis, left leg 01/31/2017  ? Incarcerated ventral hernia 01/02/2013  ? ? ?Orientation RESPIRATION BLADDER Height & Weight   ?  ?Self, Time, Situation, Place ? O2 Incontinent Weight: 177 lb 0.5 oz (80.3 kg) ?Height:  5' (152.4 cm)  ?BEHAVIORAL SYMPTOMS/MOOD NEUROLOGICAL BOWEL NUTRITION STATUS  ?    Incontinent Diet (carb control diet)  ?AMBULATORY STATUS COMMUNICATION OF NEEDS Skin   ?  Verbally Normal ?  ?  ?  ?    ?     ?     ? ? ?Personal Care Assistance Level of Assistance  ?Bathing, Feeding, Dressing Bathing Assistance: Maximum assistance ?Feeding assistance: Independent ?Dressing Assistance: Maximum assistance ?   ? ?Functional Limitations Info  ?Sight, Hearing, Speech Sight Info: Adequate ?Hearing Info: Adequate ?Speech Info: Adequate  ? ? ?SPECIAL CARE FACTORS FREQUENCY  ?  PT (By licensed PT), OT (By licensed OT)   ?  ?PT Frequency: 5x per week ?OT Frequency: 54x per week ?  ?  ?  ?   ? ? ?Contractures Contractures Info: Not present  ? ? ?Additional Factors Info  ?Code Status, Allergies Code Status Info: DNR ?Allergies Info: NKA ?  ?  ?   ?   ? ?Current Medications (01/14/2022):  This is the current hospital active medication list ?Current Facility-Administered Medications  ?Medication Dose Route Frequency Provider Last Rate Last Admin  ? acetaminophen (TYLENOL) tablet 1,000 mg  1,000 mg Oral TID Philis Pique, NP   1,000 mg at 01/14/22 1233  ? albuterol (PROVENTIL) (2.5 MG/3ML) 0.083% nebulizer solution 3 mL  3 mL Inhalation Q6H PRN Marylyn Ishihara, Tyrone A, DO      ? allopurinol (ZYLOPRIM) tablet 100 mg  100 mg Oral Daily Kyle, Tyrone A, DO   100 mg at 01/14/22 1233  ? apixaban (ELIQUIS) tablet 2.5 mg  2.5 mg Oral BID Marylyn Ishihara, Tyrone A, DO   2.5 mg at 01/14/22 1234  ? atorvastatin (LIPITOR) tablet 20 mg  20 mg Oral Daily Kyle, Tyrone A, DO   20 mg at 01/14/22 1234  ? calcium acetate (PHOSLO) capsule 667 mg  667 mg Oral TID WC Kyle, Tyrone A, DO   667 mg at 01/14/22 1237  ? carbidopa-levodopa (SINEMET IR) 25-100 MG per tablet immediate release 2 tablet  2 tablet Oral Daily Kyle, Tyrone A, DO   2 tablet at 01/14/22 1236  ? cefTRIAXone (ROCEPHIN) 1 g in sodium chloride 0.9 % 100 mL IVPB  1 g Intravenous Q24H Dahal, Binaya, MD 200 mL/hr at 01/14/22 1352 1 g at 01/14/22 1352  ? Chlorhexidine Gluconate Cloth 2 % PADS 6 each  6 each Topical Q0600 Kyle, Tyrone A, DO   6 each at 01/14/22 0600  ? docusate sodium (COLACE) capsule 200 mg  200 mg Oral QHS Kyle, Tyrone A, DO   200 mg at 01/13/22 2110  ? fluticasone (FLONASE) 50 MCG/ACT nasal spray 2 spray  2 spray Each Nare Daily Kyle, Tyrone A, DO   2 spray at 01/14/22 1232  ? HYDROmorphone (DILAUDID) injection 0.5 mg  0.5 mg Intravenous Q2H PRN Roney Jaffe, MD   0.5 mg at 01/14/22 0846  ? insulin aspart (novoLOG) injection 0-15 Units  0-15 Units Subcutaneous TID WC Kyle, Tyrone A, DO   2 Units at 01/13/22 1832  ? insulin aspart (novoLOG) injection 0-5 Units  0-5 Units Subcutaneous QHS Kyle, Tyrone A, DO   2 Units at 01/11/22 2128  ? insulin NPH Human (NOVOLIN N) injection 5 Units  5 Units Subcutaneous BID AC & HS  Dahal, Marlowe Aschoff, MD   5 Units at 01/13/22 2111  ? lip balm (CARMEX) ointment   Topical BID Philis Pique, NP   Given at 01/14/22 1238  ? loratadine (CLARITIN) tablet 10 mg  10 mg Oral Daily Kyle, Tyrone A, DO   10 mg at 01/14/22 1234  ? MEDLINE mouth rinse  15 mL Mouth Rinse Q2H while awake Shaffer, Johnell Comings, NP   15 mL at 01/14/22 1238  ? midodrine (PROAMATINE) tablet 10 mg  10 mg Oral Once per day on Mon Wed Fri Domenic Polite, MD   10 mg at 01/14/22 1233  ? And  ? midodrine (PROAMATINE) tablet 5 mg  5 mg Oral Once per day on Mon Wed Fri Domenic Polite, MD   5 mg at 01/12/22  1652  ? oxyCODONE (Oxy IR/ROXICODONE) immediate release tablet 5 mg  5 mg Oral Q4H PRN Marylyn Ishihara, Tyrone A, DO   5 mg at 01/14/22 1233  ? polyethylene glycol (MIRALAX / GLYCOLAX) packet 17 g  17 g Oral Daily Dahal, Marlowe Aschoff, MD   17 g at 01/14/22 1238  ? predniSONE (DELTASONE) tablet 2.5 mg  2.5 mg Oral Daily Kyle, Tyrone A, DO   2.5 mg at 01/14/22 1233  ? rOPINIRole (REQUIP) tablet 4 mg  4 mg Oral TID Cherylann Ratel A, DO   4 mg at 01/14/22 1232  ? senna-docusate (Senokot-S) tablet 2 tablet  2 tablet Oral QHS Philis Pique, NP   2 tablet at 01/13/22 2110  ? sertraline (ZOLOFT) tablet 25 mg  25 mg Oral Daily Kyle, Tyrone A, DO   25 mg at 01/14/22 1234  ? ? ? ?Discharge Medications: ?acetaminophen 500 MG tablet ?Commonly known as: TYLENOL ?Take 2 tablets (1,000 mg total) by mouth 3 (three) times daily. ?What changed:  ?how much to take ?when to take this ?reasons to take this ?   ?ADULT NUTRITIONAL SUPPLEMENT + PO ?Take 1 tablet by mouth in the morning and at bedtime. Wound Healing ?   ?albuterol 108 (90 Base) MCG/ACT inhaler ?Commonly known as: VENTOLIN HFA ?Inhale 2 puffs into the lungs every 6 (six) hours as needed for wheezing or shortness of breath. ?   ?allopurinol 100 MG tablet ?Commonly known as: ZYLOPRIM ?Take 100 mg by mouth daily. ?   ?apixaban 2.5 MG Tabs tablet ?Commonly known as: ELIQUIS ?Take 2.5 mg by mouth 2 (two) times  daily. ?   ?atorvastatin 20 MG tablet ?Commonly known as: LIPITOR ?Take 20 mg by mouth daily. ?   ?B-D INS SYR ULTRAFINE 1CC/30G 30G X 1/2" 1 ML Misc ?Generic drug: Insulin Syringe-Needle U-100 ?1 each

## 2022-01-14 NOTE — Discharge Summary (Addendum)
? ?Physician Discharge Summary  ?Jean Mcclain TIW:[id number] DOB: 12-03-1945 DOA: 01/09/2022 ? ?PCP: Jean Lopes, MD ? ?Admit date: 01/09/2022 ?Discharge date: 01/15/2022 ? ?Admitted From: ALF ?Discharge disposition: Back to ALF ? ?Brief narrative: ?Jean Mcclain is a 76 y.o. female with PMH significant for ESRD-HD-MWF, DM2, HTN, HLD, obesity, OSA, COPD on chronic nightly O2, vitamin D deficiency, venous stasis ulcer ?Patient presented to the ED on 4/2 with complaint of back pain. ?Per history, patient has chronic bilateral leg weakness, essentially nonambulatory and gets moved by a [employer] lift.  1 week ago, while being moved with a [employer] lift, she apparently was rolled up into a fetal position after which she started having back pain not controlled with outpatient meds and is brought to the ED. ? ?CT thoracic spine showed lucency through the osteophytes at the inferior aspect of T8 ?extending into the inferior aspect of the vertebral body, concerning for an atypical fracture in the setting of DISH. ?CT lumbar spine did not show any acute fracture or traumatic listhesis in the spine. ?Admitted to hospitalist service ?Neurosurgery consulted  ?MRI spine was obtained which showed an acute nondisplaced fracture through the inferior endplate and overlying osteophyte at T8 with mild overlying prevertebral soft tissue edema, diffuse idiopathic skeletal hyperostosis. ? ?Subjective: ?Patient was seen and examined this morning in dialysis unit..  Always anxious.  Started accepting oxycodone.  But does not want to try stronger pain medicines. ? ?Principal Problem: ?  Thoracic spine fracture ([place]) ?Active Problems: ?  Insulin-requiring or dependent type II diabetes mellitus (Jean Mcclain) ?  Essential hypertension ?  Kidney lesion ?  OSA (obstructive sleep apnea) ?  ESRD on hemodialysis ([place]) ?  Intractable pain ?  GERD (gastroesophageal reflux disease) ?  HLD (hyperlipidemia) ?  Hyperkalemia ?  Anxiety ?  History of DVT (deep vein  thrombosis) ?  Gout ?  ?Assessment and Plan: ?Acute nondisplaced T8 Fracture ?Intractable back pain ?Chronic immobility ?-CT scan and MRI finding as above.  ?-No surgical intervention recommended by neurosurgery.  ?-Continue scheduled Tylenol and as needed oxycodone. ?-PT eval and palliative care consult appreciated.   ?-Okay to discharge back to ALF today ?  ?ESRD on HD ?Hypertension ?Hyperkalemia/hyperphosphatemia ?-Potassium level and magnesium levels managed with dialysis. ?-Euvolemic.  Remains on midodrine. ?Recent Labs  ?Lab 01/09/22 ?1739 01/10/22 ?2505 01/11/22 ?0258 01/12/22 ?0341 01/14/22 ?0945  ?K 5.7* 6.2* 5.4* 4.8 5.0  ?PHOS 5.8*  --  5.6* 6.8*  --   ? ?Muscle jitteriness ?-Patient stated that she was on gabapentin 300 mg at bedtime but only as needed.  She states she gets jittery with the gabapentin.  Gabapentin has been stopped.  Symptoms improved. ? ?UTI ?Acute urinary retention ?-Patient was noted to have foul-smelling purulent urine.  4/4, urinalysis showed turbid yellow urine with moderate amount of hemoglobin, large leukocytes, many bacteria.  Multiple species seen in urine culture.  Given 3 days of empiric IV Rocephin. ?-Required in and out catheterization twice.  Started on Flomax.  Not retaining urine at this time. ? ?Type 2 diabetes mellitus ?Hypoglycemia ?-A1c 7.2 on 01/09/2022 ?-Currently on Novolin 5 units twice daily and sliding scale insulin and Accu-Cheks.  ?-Blood sugar level adequately controlled.  I will discharge her on the current regimen. ?Recent Labs  ?Lab 01/14/22 ?1243 01/14/22 ?1645 01/14/22 ?2108 01/15/22 ?0759 01/15/22 ?1122  ?GLUCAP 72 128* 121* 61* 138*  ? ?COPD on nocturnal O2 supplementation ?-continue home regimen ?  ?Hx of DVT ?-continue eliquis ?  ?  GERD ?-protonix ?  ?HLD ?-continue statin ?   ?Parkinson's disease ?Restless leg syndrome ?Anxiety ?-continue Sinemet, hydroxyzine, Zoloft, Requip ?  ?Gout ?-Allopurinol, ?  ?Right kidney lesion ?-as described on CT. ordered  renal ultrasound. ? ?Goals of care ?  Code Status: DNR  ? ? ?Mobility: Chronically immobile ? ? ? ?Wounds:  ?- ?Pressure Injury 01/22/20 Thigh Right;Left;Posterior Stage 2 -  Partial thickness loss of dermis presenting as a shallow open injury with a red, pink wound bed without slough. two large pressure/friction wounds noted to the posterior right and left thighs (Active)  ?Date First Assessed/Time First Assessed: 01/22/20 1848   Location: Thigh  Location Orientation: Right;Left;Posterior  Staging: Stage 2 -  Partial thickness loss of dermis presenting as a shallow open injury with a red, pink wound bed without slough.  ...  ?  ?Assessments 01/23/2020  5:15 AM 01/25/2020 10:10 AM  ?Dressing Type Foam - Lift dressing to assess site every shift Foam - Lift dressing to assess site every shift  ?Dressing Changed Clean;Dry;Intact  ?Dressing Change Frequency -- Daily  ?Site / Wound Assessment Bleeding --  ?Treatment Cleansed --  ?   ?No Linked orders to display  ?   ?Pressure Injury 06/14/20 Buttocks Right;Posterior Stage 2 -  Partial thickness loss of dermis presenting as a shallow open injury with a red, pink wound bed without slough. (Active)  ?Date First Assessed/Time First Assessed: 06/14/20 2200   Location: Buttocks  Location Orientation: Right;Posterior  Staging: Stage 2 -  Partial thickness loss of dermis presenting as a shallow open injury with a red, pink wound bed without slough.  Pr...  ?  ?Assessments 06/14/2020 10:00 PM 06/25/2020  7:48 PM  ?Dressing Type Foam - Lift dressing to assess site every shift Foam - Lift dressing to assess site every shift  ?Dressing Changed Reinforced  ?Dressing Change Frequency -- PRN  ?State of Healing Non-healing Non-healing  ?Site / Wound Assessment Dusky;Painful Clean;Dry;Pink  ?% Wound base Red or Granulating 25% 25%  ?% Wound base Other/Granulation Tissue (Comment) 75% 75%  ?Peri-wound Assessment Edema;Excoriated;Denuded Intact  ?Wound Length (cm) 5.5 cm 5.5 cm  ?Wound Width  (cm) 2 cm 2 cm  ?Wound Depth (cm) 0.13 cm 0.13 cm  ?Wound Surface Area (cm^2) 11 cm^2 11 cm^2  ?Wound Volume (cm^3) 1.43 cm^3 1.31 cm^3  ?Tunneling (cm) 0 0  ?Undermining (cm) 0 0  ?Margins Attached edges (approximated) Unattached edges (unapproximated)  ?Drainage Amount Minimal None  ?Drainage Description Serosanguineous Serosanguineous  ?Treatment Cleansed;Other (Comment) --  ?   ?No Linked orders to display  ?   ?Incision (Closed) 06/24/20 Leg Left (Active)  ?Date First Assessed/Time First Assessed: 06/24/20 1004   Location: Leg  Location Orientation: Left  ?  ?Assessments 06/24/2020 10:20 AM 06/25/2020  7:48 PM  ?Dressing Type Compression wrap Compression wrap  ?Dressing Clean;Dry;Intact Clean;Dry;Intact  ?Site / Wound Assessment Dressing in place / Unable to assess Dressing in place / Unable to assess  ?Margins Unattached edges (unapproximated) Unattached edges (unapproximated)  ?Drainage Amount None None  ?Drainage Description No odor No odor  ?Treatment Negative pressure wound therapy Negative pressure wound therapy  ?   ?No Linked orders to display  ?   ?Negative Pressure Wound Therapy Ankle Left (Active)  ?Placement Date/Time: 06/24/20 1007   Wound Type: Surgical (Open wound)  Location: Ankle  Location Orientation: Left  ?  ?Assessments 06/24/2020 10:20 AM 06/25/2020  7:48 PM  ?Site / Wound Assessment -- Dressing in place /  Unable to assess  ?Cycle Continuous Continuous  ?Target Pressure (mmHg) -- 125  ?Drainage Amount None None  ?Output (mL) -- 0 mL  ?   ?No Linked orders to display  ?   ?Incision (Closed) 09/29/20 Vagina (Active)  ?Date First Assessed/Time First Assessed: 09/29/20 0957   Location: Vagina  ?  ?Assessments 09/29/2020 10:47 AM 09/29/2020 11:15 AM  ?Dressing Type Peripads Peripads  ?Dressing Clean;Dry;Intact Clean;Dry;Intact  ?   ?No Linked orders to display  ? ? ?Discharge Exam:  ? ?Vitals:  ? 01/15/22 0317 01/15/22 0727 01/15/22 1100 01/15/22 1544  ?BP: (!) 143/67 (!) 123/48 (!) 98/52 (!)  105/57  ?Pulse: 68 67 70 64  ?Resp: '17 15 19 18  '$ ?Temp: 97.9 ?F (36.6 ?C) 98.3 ?F (36.8 ?C) 97.8 ?F (36.6 ?C) 98.1 ?F (36.7 ?C)  ?TempSrc: Oral Oral Oral Oral  ?SpO2: 97% 97% 97% 95%  ?Weight:      ?Height:

## 2022-01-14 NOTE — Progress Notes (Signed)
?[facility] ?Progress Note  ? ?Subjective:    ?Seen in HD unit. No UF with HD today. Endorsing neck and back pain.  ? ?Objective ?Vitals:  ? 01/14/22 0900 01/14/22 0930 01/14/22 1000 01/14/22 1030  ?BP: (!) 112/54 (!) 128/44 (!) 128/44 95/80  ?Pulse: 76 75 (!) 55 81  ?Resp: 18 20 (!) 21 20  ?Temp:      ?TempSrc:      ?SpO2:      ?Weight:      ?Height:      ? ?Physical Exam ?General: Chronically ill-appearing; on 2L O2 [place]; NAD ?Heart: S1 an S2; No murmurs, rubs, or gallops ?Lungs: Clear anteriorly and laterally. No wheezing, rales, or rhonchi. Breathing unlabored ?Abdomen: Soft and non-tender ?Extremities: No edema BLLE ?Dialysis Access: R AVF (+) B/T  ?  ?Filed Weights  ? 01/10/22 1640 01/12/22 0804 01/14/22 0706  ?Weight: 86.5 kg 80.3 kg 80.3 kg  ? ?No intake or output data in the 24 hours ending 01/14/22 1139 ? ? ?Additional Objective ?Labs: ?Basic Metabolic Panel: ?Recent Labs  ?Lab 01/09/22 ?1739 01/10/22 ?6384 01/11/22 ?6659 01/12/22 ?0341  ?NA 137 135 136 133*  ?K 5.7* 6.2* 5.4* 4.8  ?CL 99 98 98 93*  ?CO2 '28 26 26 27  '$ ?GLUCOSE 95 122* 71 119*  ?BUN 59* 67* 28* 52*  ?CREATININE 6.61* 7.14* 4.22* 6.40*  ?CALCIUM 9.1 8.5* 8.8* 8.8*  ?PHOS 5.8*  --  5.6* 6.8*  ? ? ?Liver Function Tests: ?Recent Labs  ?Lab 01/09/22 ?1739 01/10/22 ?9357 01/12/22 ?0341  ?AST  --  9*  --   ?ALT  --  <5  --   ?ALKPHOS  --  115  --   ?BILITOT  --  0.7  --   ?PROT  --  6.2*  --   ?ALBUMIN 3.0* 2.9* 2.8*  ? ? ?No results for input(s): LIPASE, AMYLASE in the last 168 hours. ?CBC: ?Recent Labs  ?Lab 01/09/22 ?1141 01/09/22 ?1739 01/10/22 ?0177 01/11/22 ?9390 01/12/22 ?0341  ?WBC 16.3* 15.7* 14.1* 14.0* 11.5*  ?NEUTROABS  --   --   --  11.6* 8.6*  ?HGB 11.9* 11.8* 11.1* 10.9* 10.7*  ?HCT 38.5 37.7 36.0 35.4* 34.4*  ?MCV 95.5 95.2 95.5 96.5 96.1  ?PLT 236 229 228 227 231  ? ? ?Blood Culture ?   ?Component Value Date/Time  ? SDES URINE, CLEAN CATCH 01/11/2022 1216  ? SPECREQUEST  01/11/2022 1216  ?  NONE ?Performed at [facility], [place] [address] [place], [place] [address] ?  ? CULT MULTIPLE SPECIES PRESENT, SUGGEST RECOLLECTION (A) 01/11/2022 1216  ? REPTSTATUS 01/12/2022 FINAL 01/11/2022 1216  ? ? ?Cardiac Enzymes: ?No results for input(s): CKTOTAL, CKMB, CKMBINDEX, TROPONINI in the last 168 hours. ?CBG: ?Recent Labs  ?Lab 01/12/22 ?2112 01/13/22 ?0820 01/13/22 ?1152 01/13/22 ?1632 01/13/22 ?2103  ?GLUCAP 122* 106* 128* 139* 130*  ? ? ?Iron Studies: No results for input(s): IRON, TIBC, TRANSFERRIN, FERRITIN in the last 72 hours. ?Lab Results  ?Component Value Date  ? INR 1.5 (H) 01/09/2022  ? INR 1.65 03/30/2017  ? INR 1.26 03/13/2017  ? ?Studies/Results: ?US RENAL ? ?Result Date: 01/13/2022 ?CLINICAL DATA:  Right kidney mass. EXAM: RENAL / URINARY TRACT ULTRASOUND COMPLETE COMPARISON:  CT abdomen and pelvis 01/09/2022. FINDINGS: Right Kidney: Renal measurements: 9.8 x 6.3 x 5.0 cm = volume: 162 mL. Echogenicity is increased. There is renal cortical thinning. There is no definite hydronephrosis. Renal lesion in question not well seen on  this study. Left Kidney: Renal measurements: 9.1 x 5.1 x 4.6 cm = volume: 110 mL. Echogenicity is increased. There is renal cortical thinning. No focal lesion identified. Bladder: Appears normal for degree of bladder distention. Other: None. IMPRESSION: 1. Renal lesion in question on prior CT is not well identified on this ultrasound. Consider further characterization with MRI. 2. Bilateral renal cortical thinning with increased echogenicity likely related to medical renal disease. Electronically Signed   By: Ronney Asters M.D.   On: 01/13/2022 21:06   ? ?Medications: ? sodium chloride    ? sodium chloride    ? cefTRIAXone (ROCEPHIN)  IV 1 g (01/13/22 1514)  ? ? acetaminophen  1,000 mg Oral TID  ? allopurinol  100 mg Oral Daily  ? apixaban  2.5 mg Oral BID  ? atorvastatin  20 mg Oral Daily  ? calcium acetate  667 mg Oral TID WC  ? carbidopa-levodopa  2 tablet Oral Daily  ? Chlorhexidine Gluconate  Cloth  6 each Topical Q0600  ? docusate sodium  200 mg Oral QHS  ? fluticasone  2 spray Each Nare Daily  ? insulin aspart  0-15 Units Subcutaneous TID WC  ? insulin aspart  0-5 Units Subcutaneous QHS  ? insulin NPH Human  5 Units Subcutaneous BID AC & HS  ? lip balm   Topical BID  ? loratadine  10 mg Oral Daily  ? mouth rinse  15 mL Mouth Rinse Q2H while awake  ? midodrine  10 mg Oral Once per day on Mon Wed Fri  ? And  ? midodrine  5 mg Oral Once per day on Mon Wed Fri  ? polyethylene glycol  17 g Oral Daily  ? predniSONE  2.5 mg Oral Daily  ? rOPINIRole  4 mg Oral TID  ? senna-docusate  2 tablet Oral QHS  ? sertraline  25 mg Oral Daily  ? ? ?Dialysis Orders: ?MWF- [facility] ?4hrs31mn, BFR 400, DFR 500,  EDW 81.1kg, 3K/ 2Ca ?Heparin None ordered ?Venofer '100mg'$  IV X 5 doses-3rd dose given 01/07/22 ?Calcitriol 0.268mmcg PO qHD-last dose 01/07/22 ?Home meds: Midodrine '10mg'$  3x/weekly-give 1592mpre-HD ? ?Assessment/Plan: ?T8 Vertebral Fracture-CT and MRI completed: Per neurosurgery's  note: no column/posterior involvement or stenosis. No surgical intervention at this time. TLSO brace ordered. Continue pain management: Preferred agents for pain control are Fentanyl and Dilaudid. Morphine should not be used. Patient reports concern taking Oxycodone d/t medication causes her to feel "out of it". She reports only taking this medication occasionally at ALF because of this issue. Reviewed last palliative care note: she doesn't want to trial scheduled narcotics here. Continue scheduled Tylenol and PRN Oxycodone. ?Possible urinary tract infection- Cx- multiple specimens. IV Rocephin per primary.  ?ESRD -  On HD MWF; Hyperkalemic at admit-now resolved. Unclear if these weights are accurate. No evidence of volume overload on exam. HD today.  ?Hypertension/volume  - BP soft/stable, continue Midodrine. No evidence of volume overload, UF as tolerated. Below her OP dry weight.  ?Anemia of CKD - Hgb 10.7, no Fe/ESA at  this time. Recently received Venofer in outpatient ?Secondary Hyperparathyroidism - Continue Hectorol and binders ?Nutrition - On renal diet with fluid restriction ?Dispo: Palliated care consulted. Now DNR.  ? ?OgeLynnda Child-C ?[facility] ?01/14/2022,11:40 AM ? ?  ?

## 2022-01-14 NOTE — Care Management Important Message (Signed)
Important Message ? ?Patient Details  ?Name: Jean Mcclain ?MRN: [id number] ?Date of Birth: July 14, 1946 ? ? ?Medicare Important Message Given:  Yes ? ? ? ? ?Agam Davenport ?01/14/2022, 12:12 PM ?

## 2022-01-15 LAB — GLUCOSE, CAPILLARY
Glucose-Capillary: 61 mg/dL — ABNORMAL LOW (ref 70–99)
Glucose-Capillary: 138 mg/dL — ABNORMAL HIGH (ref 70–99)
Glucose-Capillary: 107 mg/dL — ABNORMAL HIGH (ref 70–99)
Glucose-Capillary: 154 mg/dL — ABNORMAL HIGH (ref 70–99)

## 2022-01-15 MED ORDER — TAMSULOSIN HCL 0.4 MG PO CAPS: 0.4000 mg | ORAL_CAPSULE | Freq: Every day | ORAL | 3 refills | Status: DC

## 2022-01-15 MED ORDER — TAMSULOSIN HCL 0.4 MG PO CAPS
0.4000 mg | ORAL_CAPSULE | Freq: Every day | ORAL | Status: DC
  Administered 2022-01-15 – 2022-01-16 (×2): 0.4 mg via ORAL
  Filled 2022-01-15 (×2): qty 1

## 2022-01-15 MED ORDER — RENA-VITE PO TABS
1.0000 | ORAL_TABLET | Freq: Every day | ORAL | Status: DC
  Administered 2022-01-15: 1 via ORAL
  Filled 2022-01-15: qty 1

## 2022-01-15 MED ORDER — NEPRO/CARBSTEADY PO LIQD: 237.0000 mL | Freq: Two times a day (BID) | ORAL | 0 refills | Status: DC

## 2022-01-15 MED ORDER — NEPRO/CARBSTEADY PO LIQD
237.0000 mL | Freq: Two times a day (BID) | ORAL | Status: DC
Start: 2022-01-15 — End: 2022-01-16
  Administered 2022-01-15 – 2022-01-16 (×2): 237 mL via ORAL

## 2022-01-15 MED ORDER — BISACODYL 10 MG RE SUPP
10.0000 mg | Freq: Every day | RECTAL | Status: DC | PRN
  Administered 2022-01-15: 10 mg via RECTAL
  Filled 2022-01-15: qty 1

## 2022-01-15 NOTE — Progress Notes (Signed)
?[facility] ?Progress Note  ? ?Subjective:    ?Seen in room. Eating breakfast. Denies chest pain, dyspnea. ?Up in chair yesterday -tolerated well  ? ?Objective ?Vitals:  ? 01/14/22 1948 01/14/22 2309 01/15/22 6503 01/15/22 0727  ?BP: (!) 112/50 (!) 102/55 (!) 143/67 (!) 123/48  ?Pulse: 69 71 68 67  ?Resp: 17 (!) '21 17 15  '$ ?Temp: 98.2 ?F (36.8 ?C) 98.5 ?F (36.9 ?C) 97.9 ?F (36.6 ?C) 98.3 ?F (36.8 ?C)  ?TempSrc: Oral Oral Oral Oral  ?SpO2: 91% 97% 97% 97%  ?Weight:      ?Height:      ? ?Physical Exam ?General: Chronically ill-appearing; on 2L O2 [place]; NAD ?Heart: S1 an S2; No murmurs, rubs, or gallops ?Lungs: Clear anteriorly and laterally. No wheezing, rales, or rhonchi. Breathing unlabored ?Abdomen: Soft and non-tender ?Extremities: No edema BLLE ?Dialysis Access: R AVF (+) B/T  ?  ?Filed Weights  ? 01/10/22 1640 01/12/22 0804 01/14/22 0706  ?Weight: 86.5 kg 80.3 kg 80.3 kg  ? ? ?Intake/Output Summary (Last 24 hours) at 01/15/2022 1024 ?Last data filed at 01/14/2022 1634 ?Gross per 24 hour  ?Intake 240 ml  ?Output 925 ml  ?Net -685 ml  ? ? ? ?Additional Objective ?Labs: ?Basic Metabolic Panel: ?Recent Labs  ?Lab 01/09/22 ?1739 01/10/22 ?5465 01/11/22 ?0258 01/12/22 ?0341 01/14/22 ?0945  ?NA 137   < > 136 133* 135  ?K 5.7*   < > 5.4* 4.8 5.0  ?CL 99   < > 98 93* 97*  ?CO2 28   < > '26 27 25  '$ ?GLUCOSE 95   < > 71 119* 77  ?BUN 59*   < > 28* 52* 57*  ?CREATININE 6.61*   < > 4.22* 6.40* 7.30*  ?CALCIUM 9.1   < > 8.8* 8.8* 8.7*  ?PHOS 5.8*  --  5.6* 6.8*  --   ? < > = values in this interval not displayed.  ? ? ?Liver Function Tests: ?Recent Labs  ?Lab 01/09/22 ?1739 01/10/22 ?6812 01/12/22 ?0341  ?AST  --  9*  --   ?ALT  --  <5  --   ?ALKPHOS  --  115  --   ?BILITOT  --  0.7  --   ?PROT  --  6.2*  --   ?ALBUMIN 3.0* 2.9* 2.8*  ? ? ?No results for input(s): LIPASE, AMYLASE in the last 168 hours. ?CBC: ?Recent Labs  ?Lab 01/09/22 ?1739 01/10/22 ?7517 01/11/22 ?0258 01/12/22 ?0341 01/14/22 ?0945  ?WBC 15.7* 14.1*  14.0* 11.5* 13.3*  ?NEUTROABS  --   --  11.6* 8.6*  --   ?HGB 11.8* 11.1* 10.9* 10.7* 10.5*  ?HCT 37.7 36.0 35.4* 34.4* 34.8*  ?MCV 95.2 95.5 96.5 96.1 96.7  ?PLT 229 228 227 231 249  ? ? ?Blood Culture ?   ?Component Value Date/Time  ? SDES URINE, CLEAN CATCH 01/11/2022 1216  ? SPECREQUEST  01/11/2022 1216  ?  NONE ?Performed at [facility], [place] [address] [place],  [address] ?  ? CULT MULTIPLE SPECIES PRESENT, SUGGEST RECOLLECTION (A) 01/11/2022 1216  ? REPTSTATUS 01/12/2022 FINAL 01/11/2022 1216  ? ? ?Cardiac Enzymes: ?No results for input(s): CKTOTAL, CKMB, CKMBINDEX, TROPONINI in the last 168 hours. ?CBG: ?Recent Labs  ?Lab 01/13/22 ?2103 01/14/22 ?1243 01/14/22 ?1645 01/14/22 ?2108 01/15/22 ?0759  ?GLUCAP 130* 72 128* 121* 61*  ? ? ?Iron Studies: No results for input(s): IRON, TIBC, TRANSFERRIN, FERRITIN in the last 72 hours. ?Lab Results  ?Component Value  Date  ? INR 1.5 (H) 01/09/2022  ? INR 1.65 03/30/2017  ? INR 1.26 03/13/2017  ? ?Studies/Results: ?US RENAL ? ?Result Date: 01/13/2022 ?CLINICAL DATA:  Right kidney mass. EXAM: RENAL / URINARY TRACT ULTRASOUND COMPLETE COMPARISON:  CT abdomen and pelvis 01/09/2022. FINDINGS: Right Kidney: Renal measurements: 9.8 x 6.3 x 5.0 cm = volume: 162 mL. Echogenicity is increased. There is renal cortical thinning. There is no definite hydronephrosis. Renal lesion in question not well seen on this study. Left Kidney: Renal measurements: 9.1 x 5.1 x 4.6 cm = volume: 110 mL. Echogenicity is increased. There is renal cortical thinning. No focal lesion identified. Bladder: Appears normal for degree of bladder distention. Other: None. IMPRESSION: 1. Renal lesion in question on prior CT is not well identified on this ultrasound. Consider further characterization with MRI. 2. Bilateral renal cortical thinning with increased echogenicity likely related to medical renal disease. Electronically Signed   By: Ronney Asters M.D.   On: 01/13/2022 21:06    ? ?Medications: ? cefTRIAXone (ROCEPHIN)  IV 1 g (01/14/22 1352)  ? ? acetaminophen  1,000 mg Oral TID  ? allopurinol  100 mg Oral Daily  ? apixaban  2.5 mg Oral BID  ? atorvastatin  20 mg Oral Daily  ? calcium acetate  667 mg Oral TID WC  ? carbidopa-levodopa  2 tablet Oral Daily  ? Chlorhexidine Gluconate Cloth  6 each Topical Q0600  ? docusate sodium  200 mg Oral QHS  ? fluticasone  2 spray Each Nare Daily  ? insulin aspart  0-15 Units Subcutaneous TID WC  ? insulin aspart  0-5 Units Subcutaneous QHS  ? insulin NPH Human  5 Units Subcutaneous BID AC & HS  ? lip balm   Topical BID  ? loratadine  10 mg Oral Daily  ? mouth rinse  15 mL Mouth Rinse Q2H while awake  ? midodrine  10 mg Oral Once per day on Mon Wed Fri  ? And  ? midodrine  5 mg Oral Once per day on Mon Wed Fri  ? polyethylene glycol  17 g Oral Daily  ? predniSONE  2.5 mg Oral Daily  ? rOPINIRole  4 mg Oral TID  ? senna-docusate  2 tablet Oral QHS  ? sertraline  25 mg Oral Daily  ? tamsulosin  0.4 mg Oral Daily  ? ? ?Dialysis Orders: ?MWF- [facility] ?4hrs82mn, BFR 400, DFR 500,  EDW 81.1kg, 3K/ 2Ca ?Heparin None ordered ?Venofer '100mg'$  IV X 5 doses-3rd dose given 01/07/22 ?Calcitriol 0.296mmcg PO qHD-last dose 01/07/22 ?Home meds: Midodrine '10mg'$  3x/weekly-give 1543mpre-HD ? ?Assessment/Plan: ?T8 Vertebral Fracture-CT and MRI completed: Per neurosurgery's  note: no column/posterior involvement or stenosis. No surgical intervention at this time. TLSO brace ordered. Continue pain management: Preferred agents for pain control are Fentanyl and Dilaudid. Morphine should not be used. Patient reports concern taking Oxycodone d/t medication causes her to feel "out of it". She reports only taking this medication occasionally at ALF because of this issue. Reviewed last palliative care note: she doesn't want to trial scheduled narcotics here. Continue scheduled Tylenol and PRN Oxycodone. ?Possible urinary tract infection- Cx- multiple specimens. IV  Rocephin per primary.  ?ESRD -  On HD MWF; Hyperkalemic at admit-now resolved. Unclear if these weights are accurate. No evidence of volume overload on exam. Next HD 4/10 ?Hypertension/volume  - BP soft/stable, continue Midodrine. No evidence of volume overload, UF as tolerated. Below her OP dry weight.  ?Anemia of CKD - Hgb  10.5, no Fe/ESA at this time. Recently received Venofer in outpatient ?Secondary Hyperparathyroidism - Continue Hectorol and binders ?Nutrition - On renal diet with fluid restriction ?Dispo: Palliated care consulted. Now DNR.  ? ?Lynnda Child PA-C ?[facility] ?01/15/2022,10:24 AM ? ?  ?

## 2022-01-15 NOTE — Progress Notes (Signed)
Hoyer lifted pt to a recliner chair to sit upright. Patient tolerated sitting in her chair for 3 hrs. Pt was medicated with pain med for moderate discomfort of her back prior assisted up to the chair. ?

## 2022-01-15 NOTE — Progress Notes (Signed)
Initial Nutrition Assessment ? ?DOCUMENTATION CODES:  ? ?Obesity unspecified ? ?INTERVENTION:  ? ?-Liberalize diet to 2 gram sodium ?-Nepro Shake po BID, each supplement provides 425 kcal and 19 grams protein  ?-Renal MVI daily ? ?NUTRITION DIAGNOSIS:  ? ?Increased nutrient needs related to chronic illness (ESRD on HD) as evidenced by estimated needs. ? ?GOAL:  ? ?Patient will meet greater than or equal to 90% of their needs ? ?MONITOR:  ? ?PO intake, Supplement acceptance, Diet advancement, Labs, Weight trends, Skin, I & O's ? ?REASON FOR ASSESSMENT:  ? ?Consult ?Assessment of nutrition requirement/status ? ?ASSESSMENT:  ? ?  ? ?Pt admitted with intractable back pain secondary to T8 fracture.  ? ?Reviewed I/O's: -685 ml x 24 hours and -1.9 L since admission ? ?UOP: 425 ml x 24 hours  ? ?Pt unavailable at time of visit. Attempted to speak with pt via call to hospital room phone, however, unable to reach. RD unable to obtain further nutrition-related history or complete nutrition-focused physical exam at this time.   ? ?Per neurosurgery notes, no plans for surgical interventions at this time.  ? ?Pt currently on a renal diet with 1.2 L fluid restriction. Pt with poor oral intake. Noted meal completions 25-50%.Per RN, pt has not been happy with her meals trays. Also noted pt with hypoglycemia.  ? ?RD liberalized diet for wider variety of meal selections. Also added manager's check for tray accuracy and notified Food and Nutrition Services of pt complaints.  ? ?Reviewed wt hx; pt has experienced a 10.6% wt loss over the past 11 months, which is not significant for time frame.  ? ?Medications reviewed and include phoslo, sinemet, colace, miralax, prednisone, and senokot. ? ?Plan to d/c back to [facility] per [facility] notes.  ? ?Lab Results  ?Component Value Date  ? HGBA1C 7.2 (H) 01/09/2022  ? PTA DM medications are 3-9 units insulin aspart TID with meals and, 32 units insulin NPH every morning, and 12 units  insulin NPH at bedtime.  ? ?Labs reviewed: Phos: 6.8, CBGS: 61-138 (inpatient orders for glycemic control are 0-15 units insulin aspart TID with meals, 0-5 units insulin aspart daily at bedtime, and 5 units insulin NPH BID).   ? ?Diet Order:   ?Diet Order   ? ?       ?  Diet general       ?  ?  Diet renal with fluid restriction Fluid restriction: 1200 mL Fluid; Room service appropriate? Yes with Assist; Fluid consistency: Thin  Diet effective now       ?  ? ?  ?  ? ?  ? ? ?EDUCATION NEEDS:  ? ?No education needs have been identified at this time ? ?Skin:  Skin Assessment: Reviewed RN Assessment ? ?Last BM:  01/14/22 ? ?Height:  ? ?Ht Readings from Last 1 Encounters:  ?01/09/22 5' (1.524 m)  ? ? ?Weight:  ? ?Wt Readings from Last 1 Encounters:  ?01/14/22 80.3 kg  ? ? ?Ideal Body Weight:  45.5 kg ? ?BMI:  Body mass index is 34.57 kg/m?. ? ?Estimated Nutritional Needs:  ? ?Kcal:  1600-1800 ? ?Protein:  80-95 grams ? ?Fluid:  1000 ml + UOP ? ? ? ?Loistine Chance, RD, LDN, CDCES ?Registered Dietitian II ?Certified Diabetes Care and Education Specialist ?Please refer to Operating Room Services for RD and/or RD on-call/weekend/after hours pager  ?

## 2022-01-15 NOTE — Progress Notes (Signed)
?PROGRESS NOTE ? ?[place]  ?DOB: [birth year]  ?PCP: Donnajean Lopes, MD ?MPN:[id number]  ?DOA: 01/09/2022 ? LOS: 6 days  ?Hospital Day: 7 ? ?Brief narrative: ?Jean Mcclain is a 76 y.o. female with PMH significant for ESRD-HD-MWF, DM2, HTN, HLD, obesity, OSA, COPD on chronic nightly O2, vitamin D deficiency, venous stasis ulcer ?Patient presented to the ED on 4/2 with complaint of back pain. ?Per history, patient has chronic bilateral leg weakness, essentially nonambulatory and gets moved by a [employer] lift.  1 week ago, while being moved with a [employer] lift, she apparently was rolled up into a fetal position after which she started having back pain not controlled with outpatient meds and is brought to the ED. ? ?CT thoracic spine showed lucency through the osteophytes at the inferior aspect of T8 ?extending into the inferior aspect of the vertebral body, concerning for an atypical fracture in the setting of DISH. ?CT lumbar spine did not show any acute fracture or traumatic listhesis in the spine. ?Admitted to hospitalist service ?Neurosurgery consulted  ?MRI spine was obtained which showed an acute nondisplaced fracture through the inferior endplate and overlying osteophyte at T8 with mild overlying prevertebral soft tissue edema, diffuse idiopathic skeletal hyperostosis. ? ?Subjective: ?Patient was seen and examined this morning. ?Feels okay this morning, little anxious about her not being able to pee.  She has required in and out catheter twice last 24 hours.  She had purulent urine yesterday.  Remains on IV Rocephin. ?Complains about diet being too restrictive.  Nutrition consulted.  Liberalized diet. ? ?Principal Problem: ?  Thoracic spine fracture ([place]) ?Active Problems: ?  Insulin-requiring or dependent type II diabetes mellitus ([place]) ?  Essential hypertension ?  Kidney lesion ?  OSA (obstructive sleep apnea) ?  ESRD on hemodialysis ([place]) ?  Intractable pain ?  GERD (gastroesophageal reflux disease) ?   HLD (hyperlipidemia) ?  Hyperkalemia ?  Anxiety ?  History of DVT (deep vein thrombosis) ?  Gout ?  ?Assessment and Plan: ?Acute nondisplaced T8 Fracture ?Intractable back pain ?Chronic immobility ?-CT scan and MRI finding as above.  ?-No surgical intervention recommended by neurosurgery.  ?-Continue scheduled Tylenol and as needed oxycodone. ?-PT eval and palliative care consult appreciated. ?  ?ESRD on HD ?Hypertension ?Hyperkalemia/hyperphosphatemia ?-Potassium level and magnesium level remains high.  ESRD patient. ?-Euvolemic.  Remains on midodrine. ?Recent Labs  ?Lab 01/09/22 ?1739 01/10/22 ?0086 01/11/22 ?0258 01/12/22 ?0341 01/14/22 ?0945  ?K 5.7* 6.2* 5.4* 4.8 5.0  ?PHOS 5.8*  --  5.6* 6.8*  --   ? ?Muscle jitteriness ?-It seems PTA, patient was on gabapentin 300 mg at bedtime but only as needed.  She seems to be getting more than usual dose of gabapentin in the hospital.  Improving jitteriness after gabapentin was stopped. ?-Continue to monitor symptoms. ? ?UTI ?Acute urinary retention ?-Patient was noted to have purulent urine.  Urinalysis showed turbid yellow urine with moderate amount of hemoglobin, large leukocytes, many bacteria.  Multiple species seen in urine culture. Currently on empiric IV Rocephin day 3 ?-Required in and out catheterization twice.  Started on Flomax. ? ?Type 2 diabetes mellitus ?Hypoglycemia ?-A1c 7.2 on 01/09/2022 ?-Home meds include Novolin twice daily 32 units and 12 units ?-Currently on Novolin 5 units twice daily and sliding scale insulin and Accu-Cheks.  ?-Blood sugars improving. ?Recent Labs  ?Lab 01/14/22 ?1243 01/14/22 ?1645 01/14/22 ?2108 01/15/22 ?0759 01/15/22 ?1122  ?GLUCAP 72 128* 121* 61* 138*  ? ?COPD on nocturnal O2  supplementation ?-continue home regimen ?  ?Hx of DVT ?-continue eliquis ?  ?GERD ?-protonix ?  ?HLD ?-continue statin ?   ?Parkinson's disease ?Restless leg syndrome ?Anxiety ?-continue Sinemet, Neurontin, hydroxyzine, Zoloft, Requip ?   ?Gout ?-Allopurinol, ?  ?Right kidney lesion ?-as described on CT. ordered renal ultrasound. ? ?Goals of care ?  Code Status: DNR  ? ? ?Mobility: Chronically immobile ? ?Nutritional status:  ?Body mass index is 34.57 kg/m?Marland Kitchen  ?Nutrition Problem: Increased nutrient needs ?Etiology: chronic illness (ESRD on HD) ?Signs/Symptoms: estimated needs ? ? ? ? ?Diet:  ?Diet Order   ? ?       ?  Diet 2 gram sodium Room service appropriate? Yes; Fluid consistency: Thin  Diet effective now       ?  ?  Diet general       ?  ? ?  ?  ? ?  ? ? ?DVT prophylaxis:  ?apixaban (ELIQUIS) tablet 2.5 mg Start: 01/09/22 2200 ?apixaban (ELIQUIS) tablet 2.5 mg   ?Antimicrobials: None ?Fluid: None ?Consultants: Nephrology, neurosurgery, palliative care ?Family Communication: None at bedside ? ?Status is: Inpatient ? ?Continue in-hospital care because: Continue on IV Rocephin.  Voiding trial. ?Level of care: Telemetry Medical  ? ?Dispo: The patient is from: ALF ?             Anticipated d/c is to: Hopefully ALF tomorrow. ?             Patient currently is medically stable to d/c. ?Difficult to place patient No ? ? ? ? ?Infusions:  ? cefTRIAXone (ROCEPHIN)  IV 1 g (01/14/22 1352)  ? ? ? ?Scheduled Meds: ? acetaminophen  1,000 mg Oral TID  ? allopurinol  100 mg Oral Daily  ? apixaban  2.5 mg Oral BID  ? atorvastatin  20 mg Oral Daily  ? calcium acetate  667 mg Oral TID WC  ? carbidopa-levodopa  2 tablet Oral Daily  ? Chlorhexidine Gluconate Cloth  6 each Topical Q0600  ? docusate sodium  200 mg Oral QHS  ? feeding supplement (NEPRO CARB STEADY)  237 mL Oral BID BM  ? fluticasone  2 spray Each Nare Daily  ? insulin aspart  0-15 Units Subcutaneous TID WC  ? insulin aspart  0-5 Units Subcutaneous QHS  ? insulin NPH Human  5 Units Subcutaneous BID AC & HS  ? lip balm   Topical BID  ? loratadine  10 mg Oral Daily  ? mouth rinse  15 mL Mouth Rinse Q2H while awake  ? midodrine  10 mg Oral Once per day on Mon Wed Fri  ? And  ? midodrine  5 mg Oral Once per  day on Mon Wed Fri  ? multivitamin  1 tablet Oral QHS  ? polyethylene glycol  17 g Oral Daily  ? predniSONE  2.5 mg Oral Daily  ? rOPINIRole  4 mg Oral TID  ? senna-docusate  2 tablet Oral QHS  ? sertraline  25 mg Oral Daily  ? tamsulosin  0.4 mg Oral Daily  ? ? ?PRN meds: ?albuterol, bisacodyl, HYDROmorphone (DILAUDID) injection, oxyCODONE  ? ?Antimicrobials: ?Anti-infectives (From admission, onward)  ? ? Start     Dose/Rate Route Frequency Ordered Stop  ? 01/12/22 1400  cefTRIAXone (ROCEPHIN) 1 g in sodium chloride 0.9 % 100 mL IVPB       ? 1 g ?200 mL/hr over 30 Minutes Intravenous Every 24 hours 01/12/22 1111    ? ?  ? ? ?Objective: ?  Vitals:  ? 01/15/22 0727 01/15/22 1100  ?BP: (!) 123/48 (!) 98/52  ?Pulse: 67 70  ?Resp: 15 19  ?Temp: 98.3 ?F (36.8 ?C) 97.8 ?F (36.6 ?C)  ?SpO2: 97% 97%  ? ? ?Intake/Output Summary (Last 24 hours) at 01/15/2022 1516 ?Last data filed at 01/14/2022 1634 ?Gross per 24 hour  ?Intake --  ?Output 425 ml  ?Net -425 ml  ? ? ?Filed Weights  ? 01/10/22 1640 01/12/22 0804 01/14/22 0706  ?Weight: 86.5 kg 80.3 kg 80.3 kg  ? ?Weight change:  ?Body mass index is 34.57 kg/m?.  ? ?Physical Exam: ?General exam: Pleasant, elderly Caucasian female.  Pain controlled at rest ?Skin: No rashes, lesions or ulcers. ?HEENT: Atraumatic, normocephalic, no obvious bleeding ?Lungs: Clear to auscultation bilaterally ?CVS: Regular rate and rhythm, no murmur ?GI/Abd soft, nontender, nondistended, positive ?CNS: Alert, awake, oriented to place and person, ?Psychiatry: Always anxious  ?Extremities: No pedal edema, no calf tenderness ? ?Data Review: I have personally reviewed the laboratory data and studies available. ? ?F/u labs ordered ?Unresulted Labs (From admission, onward)  ? ?  Start     Ordered  ? 01/09/22 1108  Urinalysis, Routine w reflex microscopic In/Out Cath Urine  Once,   R       ? 01/09/22 1107  ? ?  ?  ? ?  ? ? ?Signed, ?Terrilee Croak, MD ?[facility] ?01/15/2022 ? ? ? ? ? ? ? ? ? ? ? ? ?

## 2022-01-16 DIAGNOSIS — L89101 Pressure ulcer of unspecified part of back, stage 1: Secondary | ICD-10-CM

## 2022-01-16 LAB — GLUCOSE, CAPILLARY
Glucose-Capillary: 83 mg/dL (ref 70–99)
Glucose-Capillary: 151 mg/dL — ABNORMAL HIGH (ref 70–99)

## 2022-01-16 NOTE — Progress Notes (Signed)
?[facility] ?Progress Note  ? ?Subjective:    ?Seen in room. Had a bad night, customer service complaints.  ?No new complaints this am. May discharge to ALF today  ? ?Objective ?Vitals:  ? 01/15/22 2326 01/16/22 0355 01/16/22 0705 01/16/22 1034  ?BP: 139/73 (!) 129/56 (!) 114/58 (!) 111/52  ?Pulse: 96 61 65 71  ?Resp: '16 16 18 15  '$ ?Temp: 98.3 ?F (36.8 ?C) 97.8 ?F (36.6 ?C) 97.7 ?F (36.5 ?C) 98.2 ?F (36.8 ?C)  ?TempSrc: Oral Oral Oral Oral  ?SpO2:  97% 97% 95%  ?Weight:      ?Height:      ? ?Physical Exam ?General: Chronically ill-appearing; on 2L O2 [place]; NAD ?Heart: S1 an S2; No murmurs, rubs, or gallops ?Lungs: Clear anteriorly and laterally. No wheezing, rales, or rhonchi. Breathing unlabored ?Abdomen: Soft and non-tender ?Extremities: No edema BLLE ?Dialysis Access: R AVF (+) B/T  ?  ?Filed Weights  ? 01/10/22 1640 01/12/22 0804 01/14/22 0706  ?Weight: 86.5 kg 80.3 kg 80.3 kg  ? ?No intake or output data in the 24 hours ending 01/16/22 1257 ? ? ?Additional Objective ?Labs: ?Basic Metabolic Panel: ?Recent Labs  ?Lab 01/09/22 ?1739 01/10/22 ?6045 01/11/22 ?0258 01/12/22 ?0341 01/14/22 ?0945  ?NA 137   < > 136 133* 135  ?K 5.7*   < > 5.4* 4.8 5.0  ?CL 99   < > 98 93* 97*  ?CO2 28   < > '26 27 25  '$ ?GLUCOSE 95   < > 71 119* 77  ?BUN 59*   < > 28* 52* 57*  ?CREATININE 6.61*   < > 4.22* 6.40* 7.30*  ?CALCIUM 9.1   < > 8.8* 8.8* 8.7*  ?PHOS 5.8*  --  5.6* 6.8*  --   ? < > = values in this interval not displayed.  ? ? ?Liver Function Tests: ?Recent Labs  ?Lab 01/09/22 ?1739 01/10/22 ?4098 01/12/22 ?0341  ?AST  --  9*  --   ?ALT  --  <5  --   ?ALKPHOS  --  115  --   ?BILITOT  --  0.7  --   ?PROT  --  6.2*  --   ?ALBUMIN 3.0* 2.9* 2.8*  ? ? ?No results for input(s): LIPASE, AMYLASE in the last 168 hours. ?CBC: ?Recent Labs  ?Lab 01/09/22 ?1739 01/10/22 ?1191 01/11/22 ?0258 01/12/22 ?0341 01/14/22 ?0945  ?WBC 15.7* 14.1* 14.0* 11.5* 13.3*  ?NEUTROABS  --   --  11.6* 8.6*  --   ?HGB 11.8* 11.1* 10.9* 10.7*  10.5*  ?HCT 37.7 36.0 35.4* 34.4* 34.8*  ?MCV 95.2 95.5 96.5 96.1 96.7  ?PLT 229 228 227 231 249  ? ? ?Blood Culture ?   ?Component Value Date/Time  ? SDES URINE, CLEAN CATCH 01/11/2022 1216  ? SPECREQUEST  01/11/2022 1216  ?  NONE ?Performed at [facility], [place] [address], [place],  [address] ?  ? CULT MULTIPLE SPECIES PRESENT, SUGGEST RECOLLECTION (A) 01/11/2022 1216  ? REPTSTATUS 01/12/2022 FINAL 01/11/2022 1216  ? ? ?Cardiac Enzymes: ?No results for input(s): CKTOTAL, CKMB, CKMBINDEX, TROPONINI in the last 168 hours. ?CBG: ?Recent Labs  ?Lab 01/15/22 ?1122 01/15/22 ?1634 01/15/22 ?2107 01/16/22 ?0720 01/16/22 ?1155  ?GLUCAP 138* 107* 154* 83 151*  ? ? ?Iron Studies: No results for input(s): IRON, TIBC, TRANSFERRIN, FERRITIN in the last 72 hours. ?Lab Results  ?Component Value Date  ? INR 1.5 (H) 01/09/2022  ? INR 1.65 03/30/2017  ? INR 1.26 03/13/2017  ? ?  Studies/Results: ?No results found. ? ?Medications: ? cefTRIAXone (ROCEPHIN)  IV 1 g (01/15/22 1500)  ? ? acetaminophen  1,000 mg Oral TID  ? allopurinol  100 mg Oral Daily  ? apixaban  2.5 mg Oral BID  ? atorvastatin  20 mg Oral Daily  ? calcium acetate  667 mg Oral TID WC  ? carbidopa-levodopa  2 tablet Oral Daily  ? Chlorhexidine Gluconate Cloth  6 each Topical Q0600  ? docusate sodium  200 mg Oral QHS  ? feeding supplement (NEPRO CARB STEADY)  237 mL Oral BID BM  ? fluticasone  2 spray Each Nare Daily  ? insulin aspart  0-15 Units Subcutaneous TID WC  ? insulin aspart  0-5 Units Subcutaneous QHS  ? insulin NPH Human  5 Units Subcutaneous BID AC & HS  ? lip balm   Topical BID  ? loratadine  10 mg Oral Daily  ? mouth rinse  15 mL Mouth Rinse Q2H while awake  ? midodrine  10 mg Oral Once per day on Mon Wed Fri  ? And  ? midodrine  5 mg Oral Once per day on Mon Wed Fri  ? multivitamin  1 tablet Oral QHS  ? polyethylene glycol  17 g Oral Daily  ? predniSONE  2.5 mg Oral Daily  ? rOPINIRole  4 mg Oral TID  ? senna-docusate  2 tablet Oral QHS  ?  sertraline  25 mg Oral Daily  ? tamsulosin  0.4 mg Oral Daily  ? ? ?Dialysis Orders: ?MWF- [facility] ?4hrs90mn, BFR 400, DFR 500,  EDW 81.1kg, 3K/ 2Ca ?Heparin None ordered ?Venofer '100mg'$  IV X 5 doses-3rd dose given 01/07/22 ?Calcitriol 0.29mmcg PO qHD-last dose 01/07/22 ?Home meds: Midodrine '10mg'$  3x/weekly-give 1526mpre-HD ? ?Assessment/Plan: ?T8 Vertebral Fracture-CT and MRI completed: Per neurosurgery's  note: no column/posterior involvement or stenosis. No surgical intervention at this time. TLSO brace ordered. Continue pain management: Preferred agents for pain control are Fentanyl and Dilaudid. Morphine should not be used. Patient reports concern taking Oxycodone d/t medication causes her to feel "out of it". She reports only taking this medication occasionally at ALF because of this issue. Reviewed last palliative care note: she doesn't want to trial scheduled narcotics here. Continue scheduled Tylenol and PRN Oxycodone. ?Possible urinary tract infection- Cx- multiple specimens. IV Rocephin per primary.  ?ESRD -  On HD MWF; Next HD 4/10 ?Hypertension/volume  - BP soft/stable, continue Midodrine. No evidence of volume overload, UF as tolerated. Below her OP dry weight.  ?Anemia of CKD - Hgb 10.5, no Fe/ESA at this time. Recently received Venofer in outpatient ?Secondary Hyperparathyroidism - Continue Hectorol and binders ?Nutrition - On renal diet with fluid restriction ?Dispo: Palliative care consulted. Now DNR.  ? ?OgeLynnda Child-C ?[facility] ?01/16/2022,12:57 PM ? ?  ?

## 2022-01-16 NOTE — Discharge Summary (Signed)
? ?Physician Discharge Summary  ?Jean Mcclain NFA:[id number] DOB: Nov 05, 1945 DOA: 01/09/2022 ? ?PCP: Donnajean Lopes, MD ? ?Admit date: 01/09/2022 ?Discharge date: 01/16/2022 ? ?Admitted From: ALF ?Discharge disposition: Back to ALF ? ?Brief narrative: ?Jean Mcclain is a 76 y.o. female with PMH significant for ESRD-HD-MWF, DM2, HTN, HLD, obesity, OSA, COPD on chronic nightly O2, vitamin D deficiency, venous stasis ulcer ?Patient presented to the ED on 4/2 with complaint of back pain. ?Per history, patient has chronic bilateral leg weakness, essentially nonambulatory and gets moved by a [employer] lift.  1 week ago, while being moved with a [employer] lift, she apparently was rolled up into a fetal position after which she started having back pain not controlled with outpatient meds and is brought to the ED. ? ?CT thoracic spine showed lucency through the osteophytes at the inferior aspect of T8 ?extending into the inferior aspect of the vertebral body, concerning for an atypical fracture in the setting of DISH. ?CT lumbar spine did not show any acute fracture or traumatic listhesis in the spine. ? ?MRI spine was obtained which showed an acute nondisplaced fracture through the inferior endplate and overlying osteophyte at T8 with mild overlying prevertebral soft tissue edema, diffuse idiopathic skeletal hyperostosis. ?Admitted to hospitalist service ?Neurosurgery consulted  ? ?Pain management with medications. ? ?Subjective: ?Patient was seen and examined this morning.  Lying on bed.  Not in distress.  No new symptoms.  No longer retaining urine. ? ?Principal Problem: ?  Thoracic spine fracture ([place]) ?Active Problems: ?  Insulin-requiring or dependent type II diabetes mellitus ([place]) ?  Essential hypertension ?  Kidney lesion ?  OSA (obstructive sleep apnea) ?  ESRD on hemodialysis ([place]) ?  Intractable pain ?  GERD (gastroesophageal reflux disease) ?  HLD (hyperlipidemia) ?  Hyperkalemia ?  Anxiety ?  History of DVT (deep  vein thrombosis) ?  Gout ?  Pressure injury of back, stage 1 ?  ?Assessment and Plan: ?Acute nondisplaced T8 Fracture ?Intractable back pain ?Chronic immobility ?-CT scan and MRI finding as above.  ?-No surgical intervention recommended by neurosurgery.  ?-Continue scheduled Tylenol and as needed oxycodone. ?-PT eval and palliative care consult appreciated.   ?-Okay to discharge back to ALF today ?  ?ESRD on HD ?Hypertension ?Hyperkalemia/hyperphosphatemia ?-Potassium level and magnesium levels managed with dialysis. ?-Euvolemic.  Remains on midodrine. ? ?Muscle jitteriness ?-Patient stated that she was on gabapentin 300 mg at bedtime but only as needed.  She states she gets jittery with the gabapentin.  Gabapentin was stopped.  Symptoms improved. ? ?UTI ?Acute urinary retention ?-Patient was noted to have foul-smelling purulent urine.  4/4, urinalysis showed turbid yellow urine with moderate amount of hemoglobin, large leukocytes, many bacteria.  Multiple species seen in urine culture.  Given 3 days of empiric IV Rocephin. ?-Required in and out catheterization twice.  Started on Flomax.  Not retaining urine at this time. ? ?Type 2 diabetes mellitus ?Hypoglycemia ?-A1c 7.2 on 01/09/2022 ?-Currently on Novolin 5 units twice daily and sliding scale insulin and Accu-Cheks.  ?-Blood sugar level adequately controlled.  I will discharge her on the current regimen. ?Recent Labs  ?Lab 01/15/22 ?0759 01/15/22 ?1122 01/15/22 ?1634 01/15/22 ?2107 01/16/22 ?0720  ?GLUCAP 61* 138* 107* 154* 83  ? ?COPD on nocturnal O2 supplementation ?-continue home regimen ?  ?Hx of DVT ?-continue eliquis ?  ?GERD ?-protonix ?  ?HLD ?-continue statin ?   ?Parkinson's disease ?Restless leg syndrome ?Anxiety ?-continue Sinemet, hydroxyzine, Zoloft, Requip ?  ?  Gout ?-Allopurinol, ?  ?Right kidney lesion ?-as described on CT. renal ultrasound obtained. ?-MRI as an outpatient ? ?Goals of care ?  Code Status: DNR  ? ? ?Mobility: Chronically  immobile ? ? ? ?Wounds:  ?- ?Pressure Injury 01/22/20 Thigh Right;Left;Posterior Stage 2 -  Partial thickness loss of dermis presenting as a shallow open injury with a red, pink wound bed without slough. two large pressure/friction wounds noted to the posterior right and left thighs (Active)  ?Date First Assessed/Time First Assessed: 01/22/20 1848   Location: Thigh  Location Orientation: Right;Left;Posterior  Staging: Stage 2 -  Partial thickness loss of dermis presenting as a shallow open injury with a red, pink wound bed without slough.  ...  ?  ?Assessments 01/23/2020  5:15 AM 01/25/2020 10:10 AM  ?Dressing Type Foam - Lift dressing to assess site every shift Foam - Lift dressing to assess site every shift  ?Dressing Changed Clean;Dry;Intact  ?Dressing Change Frequency -- Daily  ?Site / Wound Assessment Bleeding --  ?Treatment Cleansed --  ?   ?No Linked orders to display  ?   ?Pressure Injury 06/14/20 Buttocks Right;Posterior Stage 2 -  Partial thickness loss of dermis presenting as a shallow open injury with a red, pink wound bed without slough. (Active)  ?Date First Assessed/Time First Assessed: 06/14/20 2200   Location: Buttocks  Location Orientation: Right;Posterior  Staging: Stage 2 -  Partial thickness loss of dermis presenting as a shallow open injury with a red, pink wound bed without slough.  Pr...  ?  ?Assessments 06/14/2020 10:00 PM 06/25/2020  7:48 PM  ?Dressing Type Foam - Lift dressing to assess site every shift Foam - Lift dressing to assess site every shift  ?Dressing Changed Reinforced  ?Dressing Change Frequency -- PRN  ?State of Healing Non-healing Non-healing  ?Site / Wound Assessment Dusky;Painful Clean;Dry;Pink  ?% Wound base Red or Granulating 25% 25%  ?% Wound base Other/Granulation Tissue (Comment) 75% 75%  ?Peri-wound Assessment Edema;Excoriated;Denuded Intact  ?Wound Length (cm) 5.5 cm 5.5 cm  ?Wound Width (cm) 2 cm 2 cm  ?Wound Depth (cm) 0.13 cm 0.13 cm  ?Wound Surface Area (cm^2) 11 cm^2  11 cm^2  ?Wound Volume (cm^3) 1.43 cm^3 1.66 cm^3  ?Tunneling (cm) 0 0  ?Undermining (cm) 0 0  ?Margins Attached edges (approximated) Unattached edges (unapproximated)  ?Drainage Amount Minimal None  ?Drainage Description Serosanguineous Serosanguineous  ?Treatment Cleansed;Other (Comment) --  ?   ?No Linked orders to display  ?   ?Incision (Closed) 06/24/20 Leg Left (Active)  ?Date First Assessed/Time First Assessed: 06/24/20 1004   Location: Leg  Location Orientation: Left  ?  ?Assessments 06/24/2020 10:20 AM 06/25/2020  7:48 PM  ?Dressing Type Compression wrap Compression wrap  ?Dressing Clean;Dry;Intact Clean;Dry;Intact  ?Site / Wound Assessment Dressing in place / Unable to assess Dressing in place / Unable to assess  ?Margins Unattached edges (unapproximated) Unattached edges (unapproximated)  ?Drainage Amount None None  ?Drainage Description No odor No odor  ?Treatment Negative pressure wound therapy Negative pressure wound therapy  ?   ?No Linked orders to display  ?   ?Negative Pressure Wound Therapy Ankle Left (Active)  ?Placement Date/Time: 06/24/20 1007   Wound Type: Surgical (Open wound)  Location: Ankle  Location Orientation: Left  ?  ?Assessments 06/24/2020 10:20 AM 06/25/2020  7:48 PM  ?Site / Wound Assessment -- Dressing in place / Unable to assess  ?Cycle Continuous Continuous  ?Target Pressure (mmHg) -- 125  ?Drainage Amount None None  ?  Output (mL) -- 0 mL  ?   ?No Linked orders to display  ?   ?Incision (Closed) 09/29/20 Vagina (Active)  ?Date First Assessed/Time First Assessed: 09/29/20 0957   Location: Vagina  ?  ?Assessments 09/29/2020 10:47 AM 09/29/2020 11:15 AM  ?Dressing Type Peripads Peripads  ?Dressing Clean;Dry;Intact Clean;Dry;Intact  ?   ?No Linked orders to display  ? ? ?Discharge Exam:  ? ?Vitals:  ? 01/15/22 1931 01/15/22 2326 01/16/22 0355 01/16/22 0705  ?BP: (!) 111/44 139/73 (!) 129/56 (!) 114/58  ?Pulse: 86 96 61 65  ?Resp: '20 16 16 18  '$ ?Temp: (!) 97.5 ?F (36.4 ?C) 98.3 ?F (36.8  ?C) 97.8 ?F (36.6 ?C) 97.7 ?F (36.5 ?C)  ?TempSrc: Oral Oral Oral Oral  ?SpO2:   97% 97%  ?Weight:      ?Height:      ? ? ?Body mass index is 34.57 kg/m?.  ?General exam: Pleasant, elderly Caucasian female.  Pain c

## 2022-01-16 NOTE — Progress Notes (Addendum)
Report called to [facility]. Nurse to nurse report called to RN [phone], RN stated she just needed a discharge packet and no need to give nurse to nurse report. [facility] present for transport. ? ?

## 2022-01-16 NOTE — TOC Progression Note (Signed)
Transition of Care (TOC) - Progression Note  ? ? ?Patient Details  ?Name: Jean Mcclain ?MRN: [id number] ?Date of Birth: 17-Dec-1945 ? ?Transition of Care (TOC) CM/SW Contact  ?Alfredia Ferguson, LCSW ?Phone Number: ?01/16/2022, 10:05 AM ? ?Clinical Narrative:    ?CSW continues to reach facility. CSW left hippa compliant voicemails with Chrys Racer and the admissions director today. CSW will update team as needed.  ? ? ?Expected Discharge Plan: Assisted Living ?Barriers to Discharge: Barriers Resolved ? ?Expected Discharge Plan and Services ?Expected Discharge Plan: Assisted Living ?In-house Referral: Clinical Social Work ?  ?  ?Living arrangements for the past 2 months: [place] ?Expected Discharge Date: 01/15/22               ?  ?  ?  ?  ?  ?  ?  ?  ?  ?  ? ? ?Social Determinants of Health (SDOH) Interventions ?  ? ?Readmission Risk Interventions ? ?  01/25/2020  ?  8:31 AM  ?Readmission Risk Prevention Plan  ?Transportation Screening Complete  ?PCP or Specialist Appt within 5-7 Days Not Complete  ?Not Complete comments d/c to SNF  ?Home Care Screening Complete  ?Medication Review (RN CM) Referral to Pharmacy  ? ? ?

## 2022-01-16 NOTE — TOC Transition Note (Signed)
Transition of Care (TOC) - CM/SW Discharge Note ? ? ?Patient Details  ?Name: Jean Mcclain ?MRN: [id number] ?Date of Birth: 1945-12-17 ? ?Transition of Care (TOC) CM/SW Contact:  ?Alfredia Ferguson, LCSW ?Phone Number: ?01/16/2022, 11:14 AM ? ? ?Clinical Narrative:    ?CSW spoke with facility and [place]. Patient is able to return today. CSW notified attending physician and RN, contacted [facility] transport and provided discharge packet with [facility] paperwork on the paper chart. RN Number for report is [phone] ? ? ?Final next level of care: Assisted Living ?Barriers to Discharge: Barriers Resolved ? ? ?Patient Goals and CMS Choice ?  ?  ?  ? ?Discharge Placement ?  ?           ?Patient chooses bed at:  [facility]) ?Patient to be transferred to facility by: [facility] ?Name of family member notified: brother ?Patient and family notified of of transfer: 01/14/22 ? ?Discharge Plan and Services ?In-house Referral: Clinical Social Work ?  ?           ?  ?  ?  ?  ?  ?  ?  ?  ?  ?  ? ?Social Determinants of Health (SDOH) Interventions ?  ? ? ?Readmission Risk Interventions ? ?  01/25/2020  ?  8:31 AM  ?Readmission Risk Prevention Plan  ?Transportation Screening Complete  ?PCP or Specialist Appt within 5-7 Days Not Complete  ?Not Complete comments d/c to SNF  ?Home Care Screening Complete  ?Medication Review (RN CM) Referral to Pharmacy  ? ? ? ? ? ?

## 2022-01-17 DIAGNOSIS — Z992 Dependence on renal dialysis: Secondary | ICD-10-CM | POA: Diagnosis not present

## 2022-01-17 DIAGNOSIS — N2581 Secondary hyperparathyroidism of renal origin: Secondary | ICD-10-CM | POA: Diagnosis not present

## 2022-01-17 DIAGNOSIS — N186 End stage renal disease: Secondary | ICD-10-CM | POA: Diagnosis not present

## 2022-01-18 DIAGNOSIS — M6281 Muscle weakness (generalized): Secondary | ICD-10-CM | POA: Diagnosis not present

## 2022-01-18 DIAGNOSIS — S22068G Other fracture of T7-T8 thoracic vertebra, subsequent encounter for fracture with delayed healing: Secondary | ICD-10-CM | POA: Diagnosis not present

## 2022-01-18 DIAGNOSIS — Z794 Long term (current) use of insulin: Secondary | ICD-10-CM | POA: Diagnosis not present

## 2022-01-18 DIAGNOSIS — E1151 Type 2 diabetes mellitus with diabetic peripheral angiopathy without gangrene: Secondary | ICD-10-CM | POA: Diagnosis not present

## 2022-01-18 DIAGNOSIS — R278 Other lack of coordination: Secondary | ICD-10-CM | POA: Diagnosis not present

## 2022-01-18 DIAGNOSIS — M545 Low back pain, unspecified: Secondary | ICD-10-CM | POA: Diagnosis not present

## 2022-01-18 DIAGNOSIS — G8929 Other chronic pain: Secondary | ICD-10-CM | POA: Diagnosis not present

## 2022-01-18 DIAGNOSIS — S22068A Other fracture of T7-T8 thoracic vertebra, initial encounter for closed fracture: Secondary | ICD-10-CM | POA: Diagnosis not present

## 2022-01-18 DIAGNOSIS — Z992 Dependence on renal dialysis: Secondary | ICD-10-CM | POA: Diagnosis not present

## 2022-01-18 DIAGNOSIS — Z515 Encounter for palliative care: Secondary | ICD-10-CM | POA: Diagnosis not present

## 2022-01-18 DIAGNOSIS — R293 Abnormal posture: Secondary | ICD-10-CM | POA: Diagnosis not present

## 2022-01-18 DIAGNOSIS — N3000 Acute cystitis without hematuria: Secondary | ICD-10-CM | POA: Diagnosis not present

## 2022-01-18 DIAGNOSIS — Z79899 Other long term (current) drug therapy: Secondary | ICD-10-CM | POA: Diagnosis not present

## 2022-01-20 DIAGNOSIS — M546 Pain in thoracic spine: Secondary | ICD-10-CM | POA: Diagnosis not present

## 2022-01-20 DIAGNOSIS — R2689 Other abnormalities of gait and mobility: Secondary | ICD-10-CM | POA: Diagnosis not present

## 2022-01-20 DIAGNOSIS — M6281 Muscle weakness (generalized): Secondary | ICD-10-CM | POA: Diagnosis not present

## 2022-01-21 DIAGNOSIS — M6281 Muscle weakness (generalized): Secondary | ICD-10-CM | POA: Diagnosis not present

## 2022-01-21 DIAGNOSIS — R293 Abnormal posture: Secondary | ICD-10-CM | POA: Diagnosis not present

## 2022-01-21 DIAGNOSIS — R278 Other lack of coordination: Secondary | ICD-10-CM | POA: Diagnosis not present

## 2022-01-21 DIAGNOSIS — S22068G Other fracture of T7-T8 thoracic vertebra, subsequent encounter for fracture with delayed healing: Secondary | ICD-10-CM | POA: Diagnosis not present

## 2022-01-24 DIAGNOSIS — N2581 Secondary hyperparathyroidism of renal origin: Secondary | ICD-10-CM | POA: Diagnosis not present

## 2022-01-24 DIAGNOSIS — Z992 Dependence on renal dialysis: Secondary | ICD-10-CM | POA: Diagnosis not present

## 2022-01-24 DIAGNOSIS — E876 Hypokalemia: Secondary | ICD-10-CM | POA: Diagnosis not present

## 2022-01-24 DIAGNOSIS — N186 End stage renal disease: Secondary | ICD-10-CM | POA: Diagnosis not present

## 2022-01-25 DIAGNOSIS — Z79899 Other long term (current) drug therapy: Secondary | ICD-10-CM | POA: Diagnosis not present

## 2022-01-25 DIAGNOSIS — M19072 Primary osteoarthritis, left ankle and foot: Secondary | ICD-10-CM | POA: Diagnosis not present

## 2022-01-25 DIAGNOSIS — R278 Other lack of coordination: Secondary | ICD-10-CM | POA: Diagnosis not present

## 2022-01-25 DIAGNOSIS — M25551 Pain in right hip: Secondary | ICD-10-CM | POA: Diagnosis not present

## 2022-01-25 DIAGNOSIS — R2689 Other abnormalities of gait and mobility: Secondary | ICD-10-CM | POA: Diagnosis not present

## 2022-01-25 DIAGNOSIS — S22068G Other fracture of T7-T8 thoracic vertebra, subsequent encounter for fracture with delayed healing: Secondary | ICD-10-CM | POA: Diagnosis not present

## 2022-01-25 DIAGNOSIS — R293 Abnormal posture: Secondary | ICD-10-CM | POA: Diagnosis not present

## 2022-01-25 DIAGNOSIS — Z992 Dependence on renal dialysis: Secondary | ICD-10-CM | POA: Diagnosis not present

## 2022-01-25 DIAGNOSIS — Z9181 History of falling: Secondary | ICD-10-CM | POA: Diagnosis not present

## 2022-01-25 DIAGNOSIS — M546 Pain in thoracic spine: Secondary | ICD-10-CM | POA: Diagnosis not present

## 2022-01-25 DIAGNOSIS — M6281 Muscle weakness (generalized): Secondary | ICD-10-CM | POA: Diagnosis not present

## 2022-01-25 DIAGNOSIS — S22068D Other fracture of T7-T8 thoracic vertebra, subsequent encounter for fracture with routine healing: Secondary | ICD-10-CM | POA: Diagnosis not present

## 2022-01-27 DIAGNOSIS — S22068G Other fracture of T7-T8 thoracic vertebra, subsequent encounter for fracture with delayed healing: Secondary | ICD-10-CM | POA: Diagnosis not present

## 2022-01-27 DIAGNOSIS — R2689 Other abnormalities of gait and mobility: Secondary | ICD-10-CM | POA: Diagnosis not present

## 2022-01-27 DIAGNOSIS — M546 Pain in thoracic spine: Secondary | ICD-10-CM | POA: Diagnosis not present

## 2022-01-27 DIAGNOSIS — M25551 Pain in right hip: Secondary | ICD-10-CM | POA: Diagnosis not present

## 2022-01-27 DIAGNOSIS — R293 Abnormal posture: Secondary | ICD-10-CM | POA: Diagnosis not present

## 2022-01-27 DIAGNOSIS — Z9181 History of falling: Secondary | ICD-10-CM | POA: Diagnosis not present

## 2022-01-27 DIAGNOSIS — R278 Other lack of coordination: Secondary | ICD-10-CM | POA: Diagnosis not present

## 2022-01-27 DIAGNOSIS — M6281 Muscle weakness (generalized): Secondary | ICD-10-CM | POA: Diagnosis not present

## 2022-02-01 DIAGNOSIS — Z992 Dependence on renal dialysis: Secondary | ICD-10-CM | POA: Diagnosis not present

## 2022-02-01 DIAGNOSIS — G894 Chronic pain syndrome: Secondary | ICD-10-CM | POA: Diagnosis not present

## 2022-02-01 DIAGNOSIS — S22068G Other fracture of T7-T8 thoracic vertebra, subsequent encounter for fracture with delayed healing: Secondary | ICD-10-CM | POA: Diagnosis not present

## 2022-02-01 DIAGNOSIS — M6281 Muscle weakness (generalized): Secondary | ICD-10-CM | POA: Diagnosis not present

## 2022-02-01 DIAGNOSIS — Z9181 History of falling: Secondary | ICD-10-CM | POA: Diagnosis not present

## 2022-02-01 DIAGNOSIS — F330 Major depressive disorder, recurrent, mild: Secondary | ICD-10-CM | POA: Diagnosis not present

## 2022-02-01 DIAGNOSIS — M25551 Pain in right hip: Secondary | ICD-10-CM | POA: Diagnosis not present

## 2022-02-01 DIAGNOSIS — R278 Other lack of coordination: Secondary | ICD-10-CM | POA: Diagnosis not present

## 2022-02-01 DIAGNOSIS — R293 Abnormal posture: Secondary | ICD-10-CM | POA: Diagnosis not present

## 2022-02-01 DIAGNOSIS — M546 Pain in thoracic spine: Secondary | ICD-10-CM | POA: Diagnosis not present

## 2022-02-01 DIAGNOSIS — M150 Primary generalized (osteo)arthritis: Secondary | ICD-10-CM | POA: Diagnosis not present

## 2022-02-01 DIAGNOSIS — R2689 Other abnormalities of gait and mobility: Secondary | ICD-10-CM | POA: Diagnosis not present

## 2022-02-02 DIAGNOSIS — E876 Hypokalemia: Secondary | ICD-10-CM | POA: Diagnosis not present

## 2022-02-02 DIAGNOSIS — N2581 Secondary hyperparathyroidism of renal origin: Secondary | ICD-10-CM | POA: Diagnosis not present

## 2022-02-02 DIAGNOSIS — Z992 Dependence on renal dialysis: Secondary | ICD-10-CM | POA: Diagnosis not present

## 2022-02-02 DIAGNOSIS — N186 End stage renal disease: Secondary | ICD-10-CM | POA: Diagnosis not present

## 2022-02-03 DIAGNOSIS — M1990 Unspecified osteoarthritis, unspecified site: Secondary | ICD-10-CM | POA: Diagnosis not present

## 2022-02-03 DIAGNOSIS — M6281 Muscle weakness (generalized): Secondary | ICD-10-CM | POA: Diagnosis not present

## 2022-02-03 DIAGNOSIS — R278 Other lack of coordination: Secondary | ICD-10-CM | POA: Diagnosis not present

## 2022-02-03 DIAGNOSIS — S22068G Other fracture of T7-T8 thoracic vertebra, subsequent encounter for fracture with delayed healing: Secondary | ICD-10-CM | POA: Diagnosis not present

## 2022-02-03 DIAGNOSIS — R293 Abnormal posture: Secondary | ICD-10-CM | POA: Diagnosis not present

## 2022-02-03 DIAGNOSIS — F330 Major depressive disorder, recurrent, mild: Secondary | ICD-10-CM | POA: Diagnosis not present

## 2022-02-06 DIAGNOSIS — E1022 Type 1 diabetes mellitus with diabetic chronic kidney disease: Secondary | ICD-10-CM | POA: Diagnosis not present

## 2022-02-06 DIAGNOSIS — Z992 Dependence on renal dialysis: Secondary | ICD-10-CM | POA: Diagnosis not present

## 2022-02-06 DIAGNOSIS — N186 End stage renal disease: Secondary | ICD-10-CM | POA: Diagnosis not present

## 2022-02-07 DIAGNOSIS — N2581 Secondary hyperparathyroidism of renal origin: Secondary | ICD-10-CM | POA: Diagnosis not present

## 2022-02-07 DIAGNOSIS — E119 Type 2 diabetes mellitus without complications: Secondary | ICD-10-CM | POA: Diagnosis not present

## 2022-02-07 DIAGNOSIS — Z992 Dependence on renal dialysis: Secondary | ICD-10-CM | POA: Diagnosis not present

## 2022-02-07 DIAGNOSIS — N186 End stage renal disease: Secondary | ICD-10-CM | POA: Diagnosis not present

## 2022-02-07 DIAGNOSIS — E876 Hypokalemia: Secondary | ICD-10-CM | POA: Diagnosis not present

## 2022-02-07 DIAGNOSIS — Z794 Long term (current) use of insulin: Secondary | ICD-10-CM | POA: Diagnosis not present

## 2022-02-08 DIAGNOSIS — Z992 Dependence on renal dialysis: Secondary | ICD-10-CM | POA: Diagnosis not present

## 2022-02-08 DIAGNOSIS — R2689 Other abnormalities of gait and mobility: Secondary | ICD-10-CM | POA: Diagnosis not present

## 2022-02-08 DIAGNOSIS — R630 Anorexia: Secondary | ICD-10-CM | POA: Diagnosis not present

## 2022-02-08 DIAGNOSIS — R278 Other lack of coordination: Secondary | ICD-10-CM | POA: Diagnosis not present

## 2022-02-08 DIAGNOSIS — M150 Primary generalized (osteo)arthritis: Secondary | ICD-10-CM | POA: Diagnosis not present

## 2022-02-08 DIAGNOSIS — M6281 Muscle weakness (generalized): Secondary | ICD-10-CM | POA: Diagnosis not present

## 2022-02-08 DIAGNOSIS — R293 Abnormal posture: Secondary | ICD-10-CM | POA: Diagnosis not present

## 2022-02-08 DIAGNOSIS — R110 Nausea: Secondary | ICD-10-CM | POA: Diagnosis not present

## 2022-02-08 DIAGNOSIS — M546 Pain in thoracic spine: Secondary | ICD-10-CM | POA: Diagnosis not present

## 2022-02-08 DIAGNOSIS — S22068G Other fracture of T7-T8 thoracic vertebra, subsequent encounter for fracture with delayed healing: Secondary | ICD-10-CM | POA: Diagnosis not present

## 2022-02-10 DIAGNOSIS — R2689 Other abnormalities of gait and mobility: Secondary | ICD-10-CM | POA: Diagnosis not present

## 2022-02-10 DIAGNOSIS — R278 Other lack of coordination: Secondary | ICD-10-CM | POA: Diagnosis not present

## 2022-02-10 DIAGNOSIS — R293 Abnormal posture: Secondary | ICD-10-CM | POA: Diagnosis not present

## 2022-02-10 DIAGNOSIS — M546 Pain in thoracic spine: Secondary | ICD-10-CM | POA: Diagnosis not present

## 2022-02-10 DIAGNOSIS — S22068G Other fracture of T7-T8 thoracic vertebra, subsequent encounter for fracture with delayed healing: Secondary | ICD-10-CM | POA: Diagnosis not present

## 2022-02-10 DIAGNOSIS — M6281 Muscle weakness (generalized): Secondary | ICD-10-CM | POA: Diagnosis not present

## 2022-02-11 ENCOUNTER — Non-Acute Institutional Stay: Payer: PPO | Admitting: Family Medicine

## 2022-02-11 VITALS — BP 150/90 | HR 72 | Resp 20 | Wt 177.3 lb

## 2022-02-11 DIAGNOSIS — E441 Mild protein-calorie malnutrition: Secondary | ICD-10-CM

## 2022-02-11 DIAGNOSIS — K5909 Other constipation: Secondary | ICD-10-CM

## 2022-02-11 DIAGNOSIS — K5900 Constipation, unspecified: Secondary | ICD-10-CM

## 2022-02-11 NOTE — Progress Notes (Signed)
? ? ?Manufacturing engineer ?Community Palliative Care Consult Note ?Telephone: [phone]  ?Fax: [phone]  ? ?Date of encounter: 02/11/22 ?5:04 PM ?PATIENT NAME: Jean Mcclain ?[address] ?[place] [address]   ?[phone] (home)  ?DOB: Sep 28, 1946 ?MRN: [id number] ?PRIMARY CARE PROVIDER:    ?Donnajean Lopes, MD,  ?[address] ?[place] [address] ?[phone] ? ?REFERRING PROVIDER:   ?Donnajean Lopes, MD ?[address] ?[place],  [place] [address] ?[phone] ? ?RESPONSIBLE PARTY:    ?Contact Information   ? ? Name Relation Home Work Mobile  ? Gold,Bernard Brother [phone]    ? Kandice Hams Friend   [phone]  ? ?  ? ? ? ?I met face to face with patient in her assisted living facility. Palliative Care was asked to follow this patient by consultation request of  Donnajean Lopes, MD to address advance care planning and complex medical decision making. This is the initial visit.  ? ? ?      ASSESSMENT, SYMPTOM MANAGEMENT AND PLAN / RECOMMENDATIONS:  ? Constipation/obstipation ?Recommend aggressive bowel regimen on weekends to include Senokot QHS, daily Miralax and would recommend Dulcolax suppository to stimulate bowel on this Saturday. ?Also recommend holding Miralax on MWF (dialysis days) but giving daily on Tues, Thurs, Sat/Sun ? ? Protein Calorie malnutrition ?Question if appetite will improve after successful bowel regimen ?Weight loss 22 lbs 10 ounces in 6 weeks ?Last Albumin 2.8, Phosphorous elevated at 6.8. ?Recommend continue renal shakes ? ? ? ?Follow up Palliative Care Visit: Palliative care will continue to follow for complex medical decision making, advance care planning, and clarification of goals. Return 4 weeks or prn. ? ? ?This visit was coded based on medical decision making (MDM). ? ?PPS: 50% ? ?HOSPICE ELIGIBILITY/DIAGNOSIS: TBD ? ?Chief Complaint:  ?[facility] received a referral to follow up with patient for  chronic disease management of acute on chronic respiratory failure and ESRD and hemodialysis.  Follow up is also to address advance directive and defining/refining goals of care. ? ?HISTORY OF PRESENT ILLNESS:  Jean Mcclain is a 76 y.o. year old female with acute on chronic respiratory failure and ESRD on hemodialysis MWF. She also has OSA, GERD, HTN, IDDM, secondary hyperparathyroidism, multi-joint OA, thoracic spine fracture, anemia of CKD, gout, hyperkalemia and hx of left popliteal DVT.  [name] current main c/o surround having a "bad taste" in her mouth when trying to eat so she "gags" which has been present since most recent hospitalization. She went out with family for her birthday this week and ordered something she generally enjoys but couldn't eat much of it or finish it.  She states they did provide her with protein "renal shakes" from dialysis since her protein was low. ?She reports a need to have a BM usually during or towards the end of dialysis with no ability to get to the bathroom because she requires a [employer] lift for transfers.  She reports having to hold it until she gets back to the facility then often cannot go once she gets back.  Expressed concern that the constipation may be affecting her by decreasing her appetite and causing her nausea ?Denies dysuria but was treated for UTI during recent hospitalization.   ?She also c/o right sided headache 3 times in the last week which is unusual for her but states she has significant arthritis in her cervical spine. States she has not seen Podiatrist in months to have toenails trimmed.  Has significant  itching and states they stopped a lot of her meds to see if that would help.  She says she has some parkinsons and her feet "feel like rocks".  No recent falls. Sometimes sleeping ok.  Brother Annett Gula is her spokesperson. ? ?History obtained from review of EMR, discussion with facility staff and/or Ms. Tomey.  ?I reviewed available labs,  medications, imaging, studies and related documents from the EMR.  Records reviewed and summarized above.  ? ?ROS ?General: NAD ?EYES: denies vision changes ?ENMT: denies dysphagia ?Cardiovascular: denies chest pain, endorses DOE ?Pulmonary: denies cough, denies increased SOB ?Abdomen: endorses poor appetite and constipation, endorses continence of bowel ?GU: denies dysuria, endorses continence of urine ?MSK:  denies increased weakness, no falls reported ?Skin: denies rashes or wounds but significant c/o pruritus ?Neurological: endorses pain "all over" neck, back and recent headaches, endorses difficulty getting comfortable in bed ?Psych: Endorses positive mood ?Heme/lymph/immuno: denies bruises, abnormal bleeding ? ?Physical Exam: ?Current and past weights: 177 lbs 5 ounces as of 01/14/22, 12/21/21 weight was 200 lbs ?Constitutional: NAD ?General: WD, obese  ?EYES: anicteric sclera, lids intact, no discharge  ?ENMT: intact hearing, oral mucous membranes moist, dentition intact ?CV: S1S2, RRR, 1+ non-pitting RLE edema, 2+ non-pitting LLE edema ?Pulmonary: CTAB, no increased work of breathing, no cough, room air ?Abdomen: normo-active BS + 4 quadrants, soft and non tender, no ascites ?GU: deferred ?MSK: no sarcopenia, moves all extremities, uses electric WC for mobility ?Skin: warm and dry, no rashes or wounds on visible skin ?Neuro:  noted generalized BLE weakness, no cognitive impairment ?Psych: non-anxious affect, A and O x 3 ?Hem/lymph/immuno: no widespread bruising ? ?CURRENT PROBLEM LIST:  ?Patient Active Problem List  ? Diagnosis Date Noted  ? Pressure injury of back, stage 1 01/16/2022  ? Thoracic spine fracture ([place]) 01/09/2022  ? Hyperkalemia 01/09/2022  ? Anxiety 01/09/2022  ? History of DVT (deep vein thrombosis) 01/09/2022  ? Gout 01/09/2022  ? Postmenopausal vaginal bleeding   ? Acute blood loss anemia 06/25/2020  ? Acute on chronic respiratory failure with hypoxia and hypercapnia ([place]) 06/25/2020  ?  Advanced care planning/counseling discussion   ? Goals of care, counseling/discussion   ? Obstructive sleep apnea   ? Primary osteoarthritis, left ankle and foot   ? Ankle fracture 06/14/2020  ? HLD (hyperlipidemia) 06/14/2020  ? Dyspnea, unspecified 02/05/2020  ? Intractable pain 01/24/2020  ? Bilateral shoulder pain 01/23/2020  ? Right knee pain 01/23/2020  ? Uncontrolled type 2 diabetes mellitus with hyperglycemia, with long-term current use of insulin ([place]) 01/23/2020  ? Pressure injury of skin 01/22/2020  ? Mild protein-calorie malnutrition ([place]) 12/16/2019  ? Personal history of anaphylaxis 12/05/2019  ? Pruritus, unspecified 12/05/2019  ? ESRD on hemodialysis ([place]) 11/20/2019  ? Myoclonus, segmental 11/20/2019  ? Allergy, unspecified, initial encounter 07/29/2019  ? Anemia in chronic kidney disease 07/15/2019  ? Coagulation defect, unspecified ([place]) 07/13/2019  ? Iron deficiency anemia, unspecified 07/11/2019  ? Encounter for immunization 07/06/2019  ? End stage renal disease ([place]) 07/02/2019  ? GERD (gastroesophageal reflux disease) 07/02/2019  ? Low back pain 07/02/2019  ? BMI 34.0-34.9,adult 07/02/2019  ? Other specified disorders of bone density and structure, unspecified site 07/02/2019  ? Secondary hyperparathyroidism of renal origin ([place]) 07/02/2019  ? Unspecified osteoarthritis, unspecified site 07/02/2019  ? OSA (obstructive sleep apnea) 03/13/2019  ? Chronic acquired lymphedema 11/05/2018  ? Kidney lesion 03/30/2017  ? Acute deep vein thrombosis (DVT) of popliteal vein of left lower extremity ([place]) 03/11/2017  ?  Insulin-requiring or dependent type II diabetes mellitus ([place]) 03/11/2017  ? Essential hypertension 03/11/2017  ? Impingement syndrome of left ankle 01/31/2017  ? Posterior tibial tendinitis, left leg 01/31/2017  ? Incarcerated ventral hernia 01/02/2013  ? ?PAST MEDICAL HISTORY:  ?Active Ambulatory Problems  ?  Diagnosis Date Noted  ? Incarcerated ventral hernia 01/02/2013  ? Impingement  syndrome of left ankle 01/31/2017  ? Posterior tibial tendinitis, left leg 01/31/2017  ? Acute deep vein thrombosis (DVT) of popliteal vein of left lower extremity ([place]) 03/11/2017  ? Insulin-requiring or dependent type II di

## 2022-02-12 ENCOUNTER — Encounter: Payer: Self-pay | Admitting: Family Medicine

## 2022-02-12 DIAGNOSIS — K5909 Other constipation: Secondary | ICD-10-CM | POA: Insufficient documentation

## 2022-02-12 DIAGNOSIS — K5900 Constipation, unspecified: Secondary | ICD-10-CM | POA: Insufficient documentation

## 2022-02-13 DIAGNOSIS — M546 Pain in thoracic spine: Secondary | ICD-10-CM | POA: Diagnosis not present

## 2022-02-13 DIAGNOSIS — R2689 Other abnormalities of gait and mobility: Secondary | ICD-10-CM | POA: Diagnosis not present

## 2022-02-13 DIAGNOSIS — M6281 Muscle weakness (generalized): Secondary | ICD-10-CM | POA: Diagnosis not present

## 2022-02-14 DIAGNOSIS — Z992 Dependence on renal dialysis: Secondary | ICD-10-CM | POA: Diagnosis not present

## 2022-02-14 DIAGNOSIS — N2581 Secondary hyperparathyroidism of renal origin: Secondary | ICD-10-CM | POA: Diagnosis not present

## 2022-02-14 DIAGNOSIS — N186 End stage renal disease: Secondary | ICD-10-CM | POA: Diagnosis not present

## 2022-02-15 DIAGNOSIS — Z992 Dependence on renal dialysis: Secondary | ICD-10-CM | POA: Diagnosis not present

## 2022-02-15 DIAGNOSIS — M150 Primary generalized (osteo)arthritis: Secondary | ICD-10-CM | POA: Diagnosis not present

## 2022-02-15 DIAGNOSIS — R293 Abnormal posture: Secondary | ICD-10-CM | POA: Diagnosis not present

## 2022-02-15 DIAGNOSIS — M546 Pain in thoracic spine: Secondary | ICD-10-CM | POA: Diagnosis not present

## 2022-02-15 DIAGNOSIS — L304 Erythema intertrigo: Secondary | ICD-10-CM | POA: Diagnosis not present

## 2022-02-15 DIAGNOSIS — N186 End stage renal disease: Secondary | ICD-10-CM | POA: Diagnosis not present

## 2022-02-15 DIAGNOSIS — R110 Nausea: Secondary | ICD-10-CM | POA: Diagnosis not present

## 2022-02-15 DIAGNOSIS — Z9981 Dependence on supplemental oxygen: Secondary | ICD-10-CM | POA: Diagnosis not present

## 2022-02-15 DIAGNOSIS — M6281 Muscle weakness (generalized): Secondary | ICD-10-CM | POA: Diagnosis not present

## 2022-02-15 DIAGNOSIS — Z515 Encounter for palliative care: Secondary | ICD-10-CM | POA: Diagnosis not present

## 2022-02-15 DIAGNOSIS — S22068G Other fracture of T7-T8 thoracic vertebra, subsequent encounter for fracture with delayed healing: Secondary | ICD-10-CM | POA: Diagnosis not present

## 2022-02-15 DIAGNOSIS — J449 Chronic obstructive pulmonary disease, unspecified: Secondary | ICD-10-CM | POA: Diagnosis not present

## 2022-02-15 DIAGNOSIS — R278 Other lack of coordination: Secondary | ICD-10-CM | POA: Diagnosis not present

## 2022-02-15 DIAGNOSIS — E1122 Type 2 diabetes mellitus with diabetic chronic kidney disease: Secondary | ICD-10-CM | POA: Diagnosis not present

## 2022-02-15 DIAGNOSIS — S22069D Unspecified fracture of T7-T8 vertebra, subsequent encounter for fracture with routine healing: Secondary | ICD-10-CM | POA: Diagnosis not present

## 2022-02-15 DIAGNOSIS — R2689 Other abnormalities of gait and mobility: Secondary | ICD-10-CM | POA: Diagnosis not present

## 2022-02-17 DIAGNOSIS — R293 Abnormal posture: Secondary | ICD-10-CM | POA: Diagnosis not present

## 2022-02-17 DIAGNOSIS — S22068G Other fracture of T7-T8 thoracic vertebra, subsequent encounter for fracture with delayed healing: Secondary | ICD-10-CM | POA: Diagnosis not present

## 2022-02-17 DIAGNOSIS — R278 Other lack of coordination: Secondary | ICD-10-CM | POA: Diagnosis not present

## 2022-02-17 DIAGNOSIS — M6281 Muscle weakness (generalized): Secondary | ICD-10-CM | POA: Diagnosis not present

## 2022-02-21 DIAGNOSIS — Z992 Dependence on renal dialysis: Secondary | ICD-10-CM | POA: Diagnosis not present

## 2022-02-21 DIAGNOSIS — N2581 Secondary hyperparathyroidism of renal origin: Secondary | ICD-10-CM | POA: Diagnosis not present

## 2022-02-21 DIAGNOSIS — N186 End stage renal disease: Secondary | ICD-10-CM | POA: Diagnosis not present

## 2022-02-21 DIAGNOSIS — E876 Hypokalemia: Secondary | ICD-10-CM | POA: Diagnosis not present

## 2022-02-22 DIAGNOSIS — M546 Pain in thoracic spine: Secondary | ICD-10-CM | POA: Diagnosis not present

## 2022-02-22 DIAGNOSIS — R278 Other lack of coordination: Secondary | ICD-10-CM | POA: Diagnosis not present

## 2022-02-22 DIAGNOSIS — M6281 Muscle weakness (generalized): Secondary | ICD-10-CM | POA: Diagnosis not present

## 2022-02-22 DIAGNOSIS — R2689 Other abnormalities of gait and mobility: Secondary | ICD-10-CM | POA: Diagnosis not present

## 2022-02-22 DIAGNOSIS — R293 Abnormal posture: Secondary | ICD-10-CM | POA: Diagnosis not present

## 2022-02-22 DIAGNOSIS — S22068G Other fracture of T7-T8 thoracic vertebra, subsequent encounter for fracture with delayed healing: Secondary | ICD-10-CM | POA: Diagnosis not present

## 2022-02-24 DIAGNOSIS — R293 Abnormal posture: Secondary | ICD-10-CM | POA: Diagnosis not present

## 2022-02-24 DIAGNOSIS — M546 Pain in thoracic spine: Secondary | ICD-10-CM | POA: Diagnosis not present

## 2022-02-24 DIAGNOSIS — F331 Major depressive disorder, recurrent, moderate: Secondary | ICD-10-CM | POA: Diagnosis not present

## 2022-02-24 DIAGNOSIS — S22068G Other fracture of T7-T8 thoracic vertebra, subsequent encounter for fracture with delayed healing: Secondary | ICD-10-CM | POA: Diagnosis not present

## 2022-02-24 DIAGNOSIS — M6281 Muscle weakness (generalized): Secondary | ICD-10-CM | POA: Diagnosis not present

## 2022-02-24 DIAGNOSIS — F419 Anxiety disorder, unspecified: Secondary | ICD-10-CM | POA: Diagnosis not present

## 2022-02-24 DIAGNOSIS — R2689 Other abnormalities of gait and mobility: Secondary | ICD-10-CM | POA: Diagnosis not present

## 2022-02-24 DIAGNOSIS — F5102 Adjustment insomnia: Secondary | ICD-10-CM | POA: Diagnosis not present

## 2022-02-24 DIAGNOSIS — R278 Other lack of coordination: Secondary | ICD-10-CM | POA: Diagnosis not present

## 2022-02-28 DIAGNOSIS — F5102 Adjustment insomnia: Secondary | ICD-10-CM | POA: Diagnosis not present

## 2022-02-28 DIAGNOSIS — F419 Anxiety disorder, unspecified: Secondary | ICD-10-CM | POA: Diagnosis not present

## 2022-02-28 DIAGNOSIS — E876 Hypokalemia: Secondary | ICD-10-CM | POA: Diagnosis not present

## 2022-02-28 DIAGNOSIS — Z992 Dependence on renal dialysis: Secondary | ICD-10-CM | POA: Diagnosis not present

## 2022-02-28 DIAGNOSIS — D631 Anemia in chronic kidney disease: Secondary | ICD-10-CM | POA: Diagnosis not present

## 2022-02-28 DIAGNOSIS — N186 End stage renal disease: Secondary | ICD-10-CM | POA: Diagnosis not present

## 2022-02-28 DIAGNOSIS — N2581 Secondary hyperparathyroidism of renal origin: Secondary | ICD-10-CM | POA: Diagnosis not present

## 2022-02-28 DIAGNOSIS — F331 Major depressive disorder, recurrent, moderate: Secondary | ICD-10-CM | POA: Diagnosis not present

## 2022-03-01 DIAGNOSIS — M6281 Muscle weakness (generalized): Secondary | ICD-10-CM | POA: Diagnosis not present

## 2022-03-01 DIAGNOSIS — S22068G Other fracture of T7-T8 thoracic vertebra, subsequent encounter for fracture with delayed healing: Secondary | ICD-10-CM | POA: Diagnosis not present

## 2022-03-01 DIAGNOSIS — R278 Other lack of coordination: Secondary | ICD-10-CM | POA: Diagnosis not present

## 2022-03-01 DIAGNOSIS — M546 Pain in thoracic spine: Secondary | ICD-10-CM | POA: Diagnosis not present

## 2022-03-01 DIAGNOSIS — R293 Abnormal posture: Secondary | ICD-10-CM | POA: Diagnosis not present

## 2022-03-01 DIAGNOSIS — R2689 Other abnormalities of gait and mobility: Secondary | ICD-10-CM | POA: Diagnosis not present

## 2022-03-03 DIAGNOSIS — M546 Pain in thoracic spine: Secondary | ICD-10-CM | POA: Diagnosis not present

## 2022-03-03 DIAGNOSIS — R278 Other lack of coordination: Secondary | ICD-10-CM | POA: Diagnosis not present

## 2022-03-03 DIAGNOSIS — R2689 Other abnormalities of gait and mobility: Secondary | ICD-10-CM | POA: Diagnosis not present

## 2022-03-03 DIAGNOSIS — R293 Abnormal posture: Secondary | ICD-10-CM | POA: Diagnosis not present

## 2022-03-03 DIAGNOSIS — S22068G Other fracture of T7-T8 thoracic vertebra, subsequent encounter for fracture with delayed healing: Secondary | ICD-10-CM | POA: Diagnosis not present

## 2022-03-03 DIAGNOSIS — M6281 Muscle weakness (generalized): Secondary | ICD-10-CM | POA: Diagnosis not present

## 2022-03-04 DIAGNOSIS — F5102 Adjustment insomnia: Secondary | ICD-10-CM | POA: Diagnosis not present

## 2022-03-04 DIAGNOSIS — F419 Anxiety disorder, unspecified: Secondary | ICD-10-CM | POA: Diagnosis not present

## 2022-03-04 DIAGNOSIS — F331 Major depressive disorder, recurrent, moderate: Secondary | ICD-10-CM | POA: Diagnosis not present

## 2022-03-04 DIAGNOSIS — Z79899 Other long term (current) drug therapy: Secondary | ICD-10-CM | POA: Diagnosis not present

## 2022-03-07 DIAGNOSIS — N186 End stage renal disease: Secondary | ICD-10-CM | POA: Diagnosis not present

## 2022-03-07 DIAGNOSIS — Z992 Dependence on renal dialysis: Secondary | ICD-10-CM | POA: Diagnosis not present

## 2022-03-07 DIAGNOSIS — E876 Hypokalemia: Secondary | ICD-10-CM | POA: Diagnosis not present

## 2022-03-07 DIAGNOSIS — N2581 Secondary hyperparathyroidism of renal origin: Secondary | ICD-10-CM | POA: Diagnosis not present

## 2022-03-08 DIAGNOSIS — M159 Polyosteoarthritis, unspecified: Secondary | ICD-10-CM | POA: Diagnosis not present

## 2022-03-08 DIAGNOSIS — M546 Pain in thoracic spine: Secondary | ICD-10-CM | POA: Diagnosis not present

## 2022-03-08 DIAGNOSIS — R110 Nausea: Secondary | ICD-10-CM | POA: Diagnosis not present

## 2022-03-08 DIAGNOSIS — R278 Other lack of coordination: Secondary | ICD-10-CM | POA: Diagnosis not present

## 2022-03-08 DIAGNOSIS — S22069D Unspecified fracture of T7-T8 vertebra, subsequent encounter for fracture with routine healing: Secondary | ICD-10-CM | POA: Diagnosis not present

## 2022-03-08 DIAGNOSIS — S22068G Other fracture of T7-T8 thoracic vertebra, subsequent encounter for fracture with delayed healing: Secondary | ICD-10-CM | POA: Diagnosis not present

## 2022-03-08 DIAGNOSIS — M6281 Muscle weakness (generalized): Secondary | ICD-10-CM | POA: Diagnosis not present

## 2022-03-08 DIAGNOSIS — Z992 Dependence on renal dialysis: Secondary | ICD-10-CM | POA: Diagnosis not present

## 2022-03-08 DIAGNOSIS — R2689 Other abnormalities of gait and mobility: Secondary | ICD-10-CM | POA: Diagnosis not present

## 2022-03-08 DIAGNOSIS — M150 Primary generalized (osteo)arthritis: Secondary | ICD-10-CM | POA: Diagnosis not present

## 2022-03-08 DIAGNOSIS — F330 Major depressive disorder, recurrent, mild: Secondary | ICD-10-CM | POA: Diagnosis not present

## 2022-03-08 DIAGNOSIS — R293 Abnormal posture: Secondary | ICD-10-CM | POA: Diagnosis not present

## 2022-03-10 DIAGNOSIS — R278 Other lack of coordination: Secondary | ICD-10-CM | POA: Diagnosis not present

## 2022-03-10 DIAGNOSIS — R293 Abnormal posture: Secondary | ICD-10-CM | POA: Diagnosis not present

## 2022-03-10 DIAGNOSIS — S22068G Other fracture of T7-T8 thoracic vertebra, subsequent encounter for fracture with delayed healing: Secondary | ICD-10-CM | POA: Diagnosis not present

## 2022-03-10 DIAGNOSIS — Z794 Long term (current) use of insulin: Secondary | ICD-10-CM | POA: Diagnosis not present

## 2022-03-10 DIAGNOSIS — E119 Type 2 diabetes mellitus without complications: Secondary | ICD-10-CM | POA: Diagnosis not present

## 2022-03-10 DIAGNOSIS — M6281 Muscle weakness (generalized): Secondary | ICD-10-CM | POA: Diagnosis not present

## 2022-03-11 DIAGNOSIS — N2581 Secondary hyperparathyroidism of renal origin: Secondary | ICD-10-CM | POA: Diagnosis not present

## 2022-03-11 DIAGNOSIS — N186 End stage renal disease: Secondary | ICD-10-CM | POA: Diagnosis not present

## 2022-03-11 DIAGNOSIS — Z992 Dependence on renal dialysis: Secondary | ICD-10-CM | POA: Diagnosis not present

## 2022-03-13 DIAGNOSIS — M6281 Muscle weakness (generalized): Secondary | ICD-10-CM | POA: Diagnosis not present

## 2022-03-13 DIAGNOSIS — M546 Pain in thoracic spine: Secondary | ICD-10-CM | POA: Diagnosis not present

## 2022-03-13 DIAGNOSIS — R2689 Other abnormalities of gait and mobility: Secondary | ICD-10-CM | POA: Diagnosis not present

## 2022-03-14 DIAGNOSIS — Z992 Dependence on renal dialysis: Secondary | ICD-10-CM | POA: Diagnosis not present

## 2022-03-14 DIAGNOSIS — D631 Anemia in chronic kidney disease: Secondary | ICD-10-CM | POA: Diagnosis not present

## 2022-03-14 DIAGNOSIS — N186 End stage renal disease: Secondary | ICD-10-CM | POA: Diagnosis not present

## 2022-03-14 DIAGNOSIS — E876 Hypokalemia: Secondary | ICD-10-CM | POA: Diagnosis not present

## 2022-03-14 DIAGNOSIS — N2581 Secondary hyperparathyroidism of renal origin: Secondary | ICD-10-CM | POA: Diagnosis not present

## 2022-03-15 DIAGNOSIS — M546 Pain in thoracic spine: Secondary | ICD-10-CM | POA: Diagnosis not present

## 2022-03-15 DIAGNOSIS — M6281 Muscle weakness (generalized): Secondary | ICD-10-CM | POA: Diagnosis not present

## 2022-03-15 DIAGNOSIS — R293 Abnormal posture: Secondary | ICD-10-CM | POA: Diagnosis not present

## 2022-03-15 DIAGNOSIS — S22068G Other fracture of T7-T8 thoracic vertebra, subsequent encounter for fracture with delayed healing: Secondary | ICD-10-CM | POA: Diagnosis not present

## 2022-03-15 DIAGNOSIS — R2689 Other abnormalities of gait and mobility: Secondary | ICD-10-CM | POA: Diagnosis not present

## 2022-03-15 DIAGNOSIS — R278 Other lack of coordination: Secondary | ICD-10-CM | POA: Diagnosis not present

## 2022-03-17 DIAGNOSIS — S22068G Other fracture of T7-T8 thoracic vertebra, subsequent encounter for fracture with delayed healing: Secondary | ICD-10-CM | POA: Diagnosis not present

## 2022-03-17 DIAGNOSIS — M6281 Muscle weakness (generalized): Secondary | ICD-10-CM | POA: Diagnosis not present

## 2022-03-17 DIAGNOSIS — R293 Abnormal posture: Secondary | ICD-10-CM | POA: Diagnosis not present

## 2022-03-17 DIAGNOSIS — R278 Other lack of coordination: Secondary | ICD-10-CM | POA: Diagnosis not present

## 2022-03-17 DIAGNOSIS — M545 Low back pain, unspecified: Secondary | ICD-10-CM | POA: Diagnosis not present

## 2022-03-17 DIAGNOSIS — R110 Nausea: Secondary | ICD-10-CM | POA: Diagnosis not present

## 2022-03-17 DIAGNOSIS — G8929 Other chronic pain: Secondary | ICD-10-CM | POA: Diagnosis not present

## 2022-03-17 DIAGNOSIS — Z992 Dependence on renal dialysis: Secondary | ICD-10-CM | POA: Diagnosis not present

## 2022-03-21 DIAGNOSIS — Z992 Dependence on renal dialysis: Secondary | ICD-10-CM | POA: Diagnosis not present

## 2022-03-21 DIAGNOSIS — N186 End stage renal disease: Secondary | ICD-10-CM | POA: Diagnosis not present

## 2022-03-21 DIAGNOSIS — N2581 Secondary hyperparathyroidism of renal origin: Secondary | ICD-10-CM | POA: Diagnosis not present

## 2022-03-22 DIAGNOSIS — Z992 Dependence on renal dialysis: Secondary | ICD-10-CM | POA: Diagnosis not present

## 2022-03-22 DIAGNOSIS — R278 Other lack of coordination: Secondary | ICD-10-CM | POA: Diagnosis not present

## 2022-03-22 DIAGNOSIS — R2689 Other abnormalities of gait and mobility: Secondary | ICD-10-CM | POA: Diagnosis not present

## 2022-03-22 DIAGNOSIS — R293 Abnormal posture: Secondary | ICD-10-CM | POA: Diagnosis not present

## 2022-03-22 DIAGNOSIS — G894 Chronic pain syndrome: Secondary | ICD-10-CM | POA: Diagnosis not present

## 2022-03-22 DIAGNOSIS — S22069D Unspecified fracture of T7-T8 vertebra, subsequent encounter for fracture with routine healing: Secondary | ICD-10-CM | POA: Diagnosis not present

## 2022-03-22 DIAGNOSIS — S22068G Other fracture of T7-T8 thoracic vertebra, subsequent encounter for fracture with delayed healing: Secondary | ICD-10-CM | POA: Diagnosis not present

## 2022-03-22 DIAGNOSIS — M159 Polyosteoarthritis, unspecified: Secondary | ICD-10-CM | POA: Diagnosis not present

## 2022-03-22 DIAGNOSIS — G20 Parkinson's disease: Secondary | ICD-10-CM | POA: Diagnosis not present

## 2022-03-22 DIAGNOSIS — R110 Nausea: Secondary | ICD-10-CM | POA: Diagnosis not present

## 2022-03-22 DIAGNOSIS — M6281 Muscle weakness (generalized): Secondary | ICD-10-CM | POA: Diagnosis not present

## 2022-03-22 DIAGNOSIS — M546 Pain in thoracic spine: Secondary | ICD-10-CM | POA: Diagnosis not present

## 2022-03-22 DIAGNOSIS — G2581 Restless legs syndrome: Secondary | ICD-10-CM | POA: Diagnosis not present

## 2022-03-24 DIAGNOSIS — M546 Pain in thoracic spine: Secondary | ICD-10-CM | POA: Diagnosis not present

## 2022-03-24 DIAGNOSIS — R2689 Other abnormalities of gait and mobility: Secondary | ICD-10-CM | POA: Diagnosis not present

## 2022-03-24 DIAGNOSIS — M159 Polyosteoarthritis, unspecified: Secondary | ICD-10-CM | POA: Diagnosis not present

## 2022-03-24 DIAGNOSIS — M6281 Muscle weakness (generalized): Secondary | ICD-10-CM | POA: Diagnosis not present

## 2022-03-24 DIAGNOSIS — F330 Major depressive disorder, recurrent, mild: Secondary | ICD-10-CM | POA: Diagnosis not present

## 2022-03-28 DIAGNOSIS — Z992 Dependence on renal dialysis: Secondary | ICD-10-CM | POA: Diagnosis not present

## 2022-03-28 DIAGNOSIS — E876 Hypokalemia: Secondary | ICD-10-CM | POA: Diagnosis not present

## 2022-03-28 DIAGNOSIS — D631 Anemia in chronic kidney disease: Secondary | ICD-10-CM | POA: Diagnosis not present

## 2022-03-28 DIAGNOSIS — N186 End stage renal disease: Secondary | ICD-10-CM | POA: Diagnosis not present

## 2022-03-28 DIAGNOSIS — N2581 Secondary hyperparathyroidism of renal origin: Secondary | ICD-10-CM | POA: Diagnosis not present

## 2022-03-29 ENCOUNTER — Encounter: Payer: Self-pay | Admitting: Podiatry

## 2022-03-29 ENCOUNTER — Ambulatory Visit (INDEPENDENT_AMBULATORY_CARE_PROVIDER_SITE_OTHER): Payer: HMO | Admitting: Podiatry

## 2022-03-29 DIAGNOSIS — R293 Abnormal posture: Secondary | ICD-10-CM | POA: Diagnosis not present

## 2022-03-29 DIAGNOSIS — B351 Tinea unguium: Secondary | ICD-10-CM | POA: Diagnosis not present

## 2022-03-29 DIAGNOSIS — E0842 Diabetes mellitus due to underlying condition with diabetic polyneuropathy: Secondary | ICD-10-CM

## 2022-03-29 DIAGNOSIS — R278 Other lack of coordination: Secondary | ICD-10-CM | POA: Diagnosis not present

## 2022-03-29 DIAGNOSIS — M79676 Pain in unspecified toe(s): Secondary | ICD-10-CM | POA: Diagnosis not present

## 2022-03-29 DIAGNOSIS — R2689 Other abnormalities of gait and mobility: Secondary | ICD-10-CM | POA: Diagnosis not present

## 2022-03-29 DIAGNOSIS — M546 Pain in thoracic spine: Secondary | ICD-10-CM | POA: Diagnosis not present

## 2022-03-29 DIAGNOSIS — S22068G Other fracture of T7-T8 thoracic vertebra, subsequent encounter for fracture with delayed healing: Secondary | ICD-10-CM | POA: Diagnosis not present

## 2022-03-29 DIAGNOSIS — I890 Lymphedema, not elsewhere classified: Secondary | ICD-10-CM

## 2022-03-29 DIAGNOSIS — M6281 Muscle weakness (generalized): Secondary | ICD-10-CM | POA: Diagnosis not present

## 2022-03-29 NOTE — Progress Notes (Signed)
  Subjective:  Patient ID: Jean Mcclain, female    DOB: 1946-01-18,   MRN: [id number]  Chief Complaint  Patient presents with   Nail Problem     Routine foot care    76 y.o. female presents for concern of thickened elongated and painful nails that are difficult to trim. Requesting to have them trimmed today. Relates burning and tingling in their feet. Patient is diabetic and last A1c was  Lab Results  Component Value Date   HGBA1C 7.2 (H) 01/09/2022   .   PCP:  Pcp, No    . Denies any other pedal complaints. Denies n/v/f/c.   Past Medical History:  Diagnosis Date   Acute and chronic respiratory failure (acute-on-chronic) (HCC)    Acute deep vein thrombosis (DVT) of popliteal vein of left lower extremity ([place]) 03/11/2017   Anxiety    Arthritis    osteo   Chronic kidney disease    Diabetes mellitus, type 2 (HCC)    Type II   DVT (deep venous thrombosis) ([place]) 03/2017   left popliteal   Facet joint disease    Facet joint disease    foot   Fibula fracture 02/2017   left   GERD (gastroesophageal reflux disease)    H/O blood clots    Hyperlipidemia    Hypertension    Macrocytic anemia    Morbid obesity (HCC)    OSA (obstructive sleep apnea)    does not use CPAP    Osteopenia    Renal disorder    MWF dialysis   Shortness of breath    with exertion   Supplemental oxygen dependent    Venous stasis ulcers (HCC)    Vitamin D deficiency     Objective:  Physical Exam: Vascular: DP/PT pulses 2/4 bilateral. CFT <3 seconds. Absent hair growth on digits. Edema noted to bilateral lower extremities. Xerosis noted bilaterally.  Skin. No lacerations or abrasions bilateral feet. Nails 1-5 bilateral  are thickened discolored and elongated with subungual debris.  Musculoskeletal: MMT 5/5 bilateral lower extremities in DF, PF, Inversion and Eversion. Deceased ROM in DF of ankle joint.  Neurological: Sensation intact to light touch. Protective sensation diminished bilateral.     Assessment:   1. Pain due to onychomycosis of toenail   2. Diabetes mellitus due to underlying condition with diabetic polyneuropathy, unspecified whether long term insulin use (HCC)   3. Lymphedema      Plan:  Patient was evaluated and treated and all questions answered. -Discussed and educated patient on diabetic foot care, especially with  regards to the vascular, neurological and musculoskeletal systems.  -Stressed the importance of good glycemic control and the detriment of not  controlling glucose levels in relation to the foot. -Discussed supportive shoes at all times and checking feet regularly.  -Mechanically debrided all nails 1-5 bilateral using sterile nail nipper and filed with dremel without incident  -Answered all patient questions -Patient to return  in 3 months for at risk foot care -Patient advised to call the office if any problems or questions arise in the meantime.   Lorenda Peck, DPM

## 2022-03-31 DIAGNOSIS — M6281 Muscle weakness (generalized): Secondary | ICD-10-CM | POA: Diagnosis not present

## 2022-03-31 DIAGNOSIS — R278 Other lack of coordination: Secondary | ICD-10-CM | POA: Diagnosis not present

## 2022-03-31 DIAGNOSIS — S22068G Other fracture of T7-T8 thoracic vertebra, subsequent encounter for fracture with delayed healing: Secondary | ICD-10-CM | POA: Diagnosis not present

## 2022-03-31 DIAGNOSIS — S22060D Wedge compression fracture of T7-T8 vertebra, subsequent encounter for fracture with routine healing: Secondary | ICD-10-CM | POA: Diagnosis not present

## 2022-03-31 DIAGNOSIS — M542 Cervicalgia: Secondary | ICD-10-CM | POA: Diagnosis not present

## 2022-03-31 DIAGNOSIS — G894 Chronic pain syndrome: Secondary | ICD-10-CM | POA: Diagnosis not present

## 2022-03-31 DIAGNOSIS — M47812 Spondylosis without myelopathy or radiculopathy, cervical region: Secondary | ICD-10-CM | POA: Diagnosis not present

## 2022-03-31 DIAGNOSIS — R2689 Other abnormalities of gait and mobility: Secondary | ICD-10-CM | POA: Diagnosis not present

## 2022-03-31 DIAGNOSIS — R293 Abnormal posture: Secondary | ICD-10-CM | POA: Diagnosis not present

## 2022-03-31 DIAGNOSIS — M546 Pain in thoracic spine: Secondary | ICD-10-CM | POA: Diagnosis not present

## 2022-04-04 DIAGNOSIS — E876 Hypokalemia: Secondary | ICD-10-CM | POA: Diagnosis not present

## 2022-04-04 DIAGNOSIS — M546 Pain in thoracic spine: Secondary | ICD-10-CM | POA: Diagnosis not present

## 2022-04-04 DIAGNOSIS — N2581 Secondary hyperparathyroidism of renal origin: Secondary | ICD-10-CM | POA: Diagnosis not present

## 2022-04-04 DIAGNOSIS — Z992 Dependence on renal dialysis: Secondary | ICD-10-CM | POA: Diagnosis not present

## 2022-04-04 DIAGNOSIS — N186 End stage renal disease: Secondary | ICD-10-CM | POA: Diagnosis not present

## 2022-04-04 DIAGNOSIS — R2689 Other abnormalities of gait and mobility: Secondary | ICD-10-CM | POA: Diagnosis not present

## 2022-04-04 DIAGNOSIS — M6281 Muscle weakness (generalized): Secondary | ICD-10-CM | POA: Diagnosis not present

## 2022-04-05 DIAGNOSIS — R278 Other lack of coordination: Secondary | ICD-10-CM | POA: Diagnosis not present

## 2022-04-05 DIAGNOSIS — R293 Abnormal posture: Secondary | ICD-10-CM | POA: Diagnosis not present

## 2022-04-05 DIAGNOSIS — M6281 Muscle weakness (generalized): Secondary | ICD-10-CM | POA: Diagnosis not present

## 2022-04-05 DIAGNOSIS — S22068G Other fracture of T7-T8 thoracic vertebra, subsequent encounter for fracture with delayed healing: Secondary | ICD-10-CM | POA: Diagnosis not present

## 2022-04-05 DIAGNOSIS — M546 Pain in thoracic spine: Secondary | ICD-10-CM | POA: Diagnosis not present

## 2022-04-05 DIAGNOSIS — R2689 Other abnormalities of gait and mobility: Secondary | ICD-10-CM | POA: Diagnosis not present

## 2022-04-07 DIAGNOSIS — M6281 Muscle weakness (generalized): Secondary | ICD-10-CM | POA: Diagnosis not present

## 2022-04-07 DIAGNOSIS — R278 Other lack of coordination: Secondary | ICD-10-CM | POA: Diagnosis not present

## 2022-04-07 DIAGNOSIS — S22068G Other fracture of T7-T8 thoracic vertebra, subsequent encounter for fracture with delayed healing: Secondary | ICD-10-CM | POA: Diagnosis not present

## 2022-04-07 DIAGNOSIS — R293 Abnormal posture: Secondary | ICD-10-CM | POA: Diagnosis not present

## 2022-04-07 DIAGNOSIS — M546 Pain in thoracic spine: Secondary | ICD-10-CM | POA: Diagnosis not present

## 2022-04-07 DIAGNOSIS — R2689 Other abnormalities of gait and mobility: Secondary | ICD-10-CM | POA: Diagnosis not present

## 2022-04-08 DIAGNOSIS — Z992 Dependence on renal dialysis: Secondary | ICD-10-CM | POA: Diagnosis not present

## 2022-04-08 DIAGNOSIS — E1022 Type 1 diabetes mellitus with diabetic chronic kidney disease: Secondary | ICD-10-CM | POA: Diagnosis not present

## 2022-04-08 DIAGNOSIS — N186 End stage renal disease: Secondary | ICD-10-CM | POA: Diagnosis not present

## 2022-04-11 DIAGNOSIS — D631 Anemia in chronic kidney disease: Secondary | ICD-10-CM | POA: Diagnosis not present

## 2022-04-11 DIAGNOSIS — N186 End stage renal disease: Secondary | ICD-10-CM | POA: Diagnosis not present

## 2022-04-11 DIAGNOSIS — Z992 Dependence on renal dialysis: Secondary | ICD-10-CM | POA: Diagnosis not present

## 2022-04-11 DIAGNOSIS — N2581 Secondary hyperparathyroidism of renal origin: Secondary | ICD-10-CM | POA: Diagnosis not present

## 2022-04-11 DIAGNOSIS — M6281 Muscle weakness (generalized): Secondary | ICD-10-CM | POA: Diagnosis not present

## 2022-04-11 DIAGNOSIS — E876 Hypokalemia: Secondary | ICD-10-CM | POA: Diagnosis not present

## 2022-04-11 DIAGNOSIS — R2689 Other abnormalities of gait and mobility: Secondary | ICD-10-CM | POA: Diagnosis not present

## 2022-04-11 DIAGNOSIS — M546 Pain in thoracic spine: Secondary | ICD-10-CM | POA: Diagnosis not present

## 2022-04-12 DIAGNOSIS — M6281 Muscle weakness (generalized): Secondary | ICD-10-CM | POA: Diagnosis not present

## 2022-04-12 DIAGNOSIS — R2689 Other abnormalities of gait and mobility: Secondary | ICD-10-CM | POA: Diagnosis not present

## 2022-04-12 DIAGNOSIS — M546 Pain in thoracic spine: Secondary | ICD-10-CM | POA: Diagnosis not present

## 2022-04-14 DIAGNOSIS — M546 Pain in thoracic spine: Secondary | ICD-10-CM | POA: Diagnosis not present

## 2022-04-14 DIAGNOSIS — R293 Abnormal posture: Secondary | ICD-10-CM | POA: Diagnosis not present

## 2022-04-14 DIAGNOSIS — R278 Other lack of coordination: Secondary | ICD-10-CM | POA: Diagnosis not present

## 2022-04-14 DIAGNOSIS — R2689 Other abnormalities of gait and mobility: Secondary | ICD-10-CM | POA: Diagnosis not present

## 2022-04-14 DIAGNOSIS — M6281 Muscle weakness (generalized): Secondary | ICD-10-CM | POA: Diagnosis not present

## 2022-04-14 DIAGNOSIS — S22068G Other fracture of T7-T8 thoracic vertebra, subsequent encounter for fracture with delayed healing: Secondary | ICD-10-CM | POA: Diagnosis not present

## 2022-04-18 DIAGNOSIS — N186 End stage renal disease: Secondary | ICD-10-CM | POA: Diagnosis not present

## 2022-04-18 DIAGNOSIS — N2581 Secondary hyperparathyroidism of renal origin: Secondary | ICD-10-CM | POA: Diagnosis not present

## 2022-04-18 DIAGNOSIS — Z992 Dependence on renal dialysis: Secondary | ICD-10-CM | POA: Diagnosis not present

## 2022-04-19 DIAGNOSIS — R2689 Other abnormalities of gait and mobility: Secondary | ICD-10-CM | POA: Diagnosis not present

## 2022-04-19 DIAGNOSIS — G2581 Restless legs syndrome: Secondary | ICD-10-CM | POA: Diagnosis not present

## 2022-04-19 DIAGNOSIS — S22068G Other fracture of T7-T8 thoracic vertebra, subsequent encounter for fracture with delayed healing: Secondary | ICD-10-CM | POA: Diagnosis not present

## 2022-04-19 DIAGNOSIS — M546 Pain in thoracic spine: Secondary | ICD-10-CM | POA: Diagnosis not present

## 2022-04-19 DIAGNOSIS — G20 Parkinson's disease: Secondary | ICD-10-CM | POA: Diagnosis not present

## 2022-04-19 DIAGNOSIS — M159 Polyosteoarthritis, unspecified: Secondary | ICD-10-CM | POA: Diagnosis not present

## 2022-04-19 DIAGNOSIS — R293 Abnormal posture: Secondary | ICD-10-CM | POA: Diagnosis not present

## 2022-04-19 DIAGNOSIS — M6281 Muscle weakness (generalized): Secondary | ICD-10-CM | POA: Diagnosis not present

## 2022-04-19 DIAGNOSIS — R278 Other lack of coordination: Secondary | ICD-10-CM | POA: Diagnosis not present

## 2022-04-21 DIAGNOSIS — M6281 Muscle weakness (generalized): Secondary | ICD-10-CM | POA: Diagnosis not present

## 2022-04-21 DIAGNOSIS — M546 Pain in thoracic spine: Secondary | ICD-10-CM | POA: Diagnosis not present

## 2022-04-21 DIAGNOSIS — R2689 Other abnormalities of gait and mobility: Secondary | ICD-10-CM | POA: Diagnosis not present

## 2022-04-25 DIAGNOSIS — D509 Iron deficiency anemia, unspecified: Secondary | ICD-10-CM | POA: Diagnosis not present

## 2022-04-25 DIAGNOSIS — R2689 Other abnormalities of gait and mobility: Secondary | ICD-10-CM | POA: Diagnosis not present

## 2022-04-25 DIAGNOSIS — N186 End stage renal disease: Secondary | ICD-10-CM | POA: Diagnosis not present

## 2022-04-25 DIAGNOSIS — Z992 Dependence on renal dialysis: Secondary | ICD-10-CM | POA: Diagnosis not present

## 2022-04-25 DIAGNOSIS — M6281 Muscle weakness (generalized): Secondary | ICD-10-CM | POA: Diagnosis not present

## 2022-04-25 DIAGNOSIS — N2581 Secondary hyperparathyroidism of renal origin: Secondary | ICD-10-CM | POA: Diagnosis not present

## 2022-04-25 DIAGNOSIS — M546 Pain in thoracic spine: Secondary | ICD-10-CM | POA: Diagnosis not present

## 2022-04-25 DIAGNOSIS — E876 Hypokalemia: Secondary | ICD-10-CM | POA: Diagnosis not present

## 2022-04-26 DIAGNOSIS — G20 Parkinson's disease: Secondary | ICD-10-CM | POA: Diagnosis not present

## 2022-04-26 DIAGNOSIS — R2689 Other abnormalities of gait and mobility: Secondary | ICD-10-CM | POA: Diagnosis not present

## 2022-04-26 DIAGNOSIS — S22068G Other fracture of T7-T8 thoracic vertebra, subsequent encounter for fracture with delayed healing: Secondary | ICD-10-CM | POA: Diagnosis not present

## 2022-04-26 DIAGNOSIS — R293 Abnormal posture: Secondary | ICD-10-CM | POA: Diagnosis not present

## 2022-04-26 DIAGNOSIS — M546 Pain in thoracic spine: Secondary | ICD-10-CM | POA: Diagnosis not present

## 2022-04-26 DIAGNOSIS — L299 Pruritus, unspecified: Secondary | ICD-10-CM | POA: Diagnosis not present

## 2022-04-26 DIAGNOSIS — G2581 Restless legs syndrome: Secondary | ICD-10-CM | POA: Diagnosis not present

## 2022-04-26 DIAGNOSIS — R278 Other lack of coordination: Secondary | ICD-10-CM | POA: Diagnosis not present

## 2022-04-26 DIAGNOSIS — M6281 Muscle weakness (generalized): Secondary | ICD-10-CM | POA: Diagnosis not present

## 2022-04-28 DIAGNOSIS — S22068G Other fracture of T7-T8 thoracic vertebra, subsequent encounter for fracture with delayed healing: Secondary | ICD-10-CM | POA: Diagnosis not present

## 2022-04-28 DIAGNOSIS — R293 Abnormal posture: Secondary | ICD-10-CM | POA: Diagnosis not present

## 2022-04-28 DIAGNOSIS — R278 Other lack of coordination: Secondary | ICD-10-CM | POA: Diagnosis not present

## 2022-04-28 DIAGNOSIS — R2689 Other abnormalities of gait and mobility: Secondary | ICD-10-CM | POA: Diagnosis not present

## 2022-04-28 DIAGNOSIS — M546 Pain in thoracic spine: Secondary | ICD-10-CM | POA: Diagnosis not present

## 2022-04-28 DIAGNOSIS — M6281 Muscle weakness (generalized): Secondary | ICD-10-CM | POA: Diagnosis not present

## 2022-05-02 DIAGNOSIS — N186 End stage renal disease: Secondary | ICD-10-CM | POA: Diagnosis not present

## 2022-05-02 DIAGNOSIS — D509 Iron deficiency anemia, unspecified: Secondary | ICD-10-CM | POA: Diagnosis not present

## 2022-05-02 DIAGNOSIS — N2581 Secondary hyperparathyroidism of renal origin: Secondary | ICD-10-CM | POA: Diagnosis not present

## 2022-05-02 DIAGNOSIS — Z992 Dependence on renal dialysis: Secondary | ICD-10-CM | POA: Diagnosis not present

## 2022-05-02 DIAGNOSIS — E876 Hypokalemia: Secondary | ICD-10-CM | POA: Diagnosis not present

## 2022-05-03 DIAGNOSIS — R278 Other lack of coordination: Secondary | ICD-10-CM | POA: Diagnosis not present

## 2022-05-03 DIAGNOSIS — R293 Abnormal posture: Secondary | ICD-10-CM | POA: Diagnosis not present

## 2022-05-03 DIAGNOSIS — M6281 Muscle weakness (generalized): Secondary | ICD-10-CM | POA: Diagnosis not present

## 2022-05-03 DIAGNOSIS — S22068G Other fracture of T7-T8 thoracic vertebra, subsequent encounter for fracture with delayed healing: Secondary | ICD-10-CM | POA: Diagnosis not present

## 2022-05-04 DIAGNOSIS — M159 Polyosteoarthritis, unspecified: Secondary | ICD-10-CM | POA: Diagnosis not present

## 2022-05-04 DIAGNOSIS — F330 Major depressive disorder, recurrent, mild: Secondary | ICD-10-CM | POA: Diagnosis not present

## 2022-05-05 DIAGNOSIS — R293 Abnormal posture: Secondary | ICD-10-CM | POA: Diagnosis not present

## 2022-05-05 DIAGNOSIS — R278 Other lack of coordination: Secondary | ICD-10-CM | POA: Diagnosis not present

## 2022-05-05 DIAGNOSIS — M6281 Muscle weakness (generalized): Secondary | ICD-10-CM | POA: Diagnosis not present

## 2022-05-05 DIAGNOSIS — S22068G Other fracture of T7-T8 thoracic vertebra, subsequent encounter for fracture with delayed healing: Secondary | ICD-10-CM | POA: Diagnosis not present

## 2022-05-05 DIAGNOSIS — M546 Pain in thoracic spine: Secondary | ICD-10-CM | POA: Diagnosis not present

## 2022-05-05 DIAGNOSIS — R2689 Other abnormalities of gait and mobility: Secondary | ICD-10-CM | POA: Diagnosis not present

## 2022-05-09 DIAGNOSIS — E1022 Type 1 diabetes mellitus with diabetic chronic kidney disease: Secondary | ICD-10-CM | POA: Diagnosis not present

## 2022-05-09 DIAGNOSIS — N186 End stage renal disease: Secondary | ICD-10-CM | POA: Diagnosis not present

## 2022-05-09 DIAGNOSIS — Z992 Dependence on renal dialysis: Secondary | ICD-10-CM | POA: Diagnosis not present

## 2022-05-09 DIAGNOSIS — D509 Iron deficiency anemia, unspecified: Secondary | ICD-10-CM | POA: Diagnosis not present

## 2022-05-09 DIAGNOSIS — N2581 Secondary hyperparathyroidism of renal origin: Secondary | ICD-10-CM | POA: Diagnosis not present

## 2022-05-10 DIAGNOSIS — R278 Other lack of coordination: Secondary | ICD-10-CM | POA: Diagnosis not present

## 2022-05-10 DIAGNOSIS — M6281 Muscle weakness (generalized): Secondary | ICD-10-CM | POA: Diagnosis not present

## 2022-05-10 DIAGNOSIS — R293 Abnormal posture: Secondary | ICD-10-CM | POA: Diagnosis not present

## 2022-05-10 DIAGNOSIS — R2689 Other abnormalities of gait and mobility: Secondary | ICD-10-CM | POA: Diagnosis not present

## 2022-05-10 DIAGNOSIS — M546 Pain in thoracic spine: Secondary | ICD-10-CM | POA: Diagnosis not present

## 2022-05-10 DIAGNOSIS — S22068G Other fracture of T7-T8 thoracic vertebra, subsequent encounter for fracture with delayed healing: Secondary | ICD-10-CM | POA: Diagnosis not present

## 2022-05-11 DIAGNOSIS — N2581 Secondary hyperparathyroidism of renal origin: Secondary | ICD-10-CM | POA: Diagnosis not present

## 2022-05-11 DIAGNOSIS — Z992 Dependence on renal dialysis: Secondary | ICD-10-CM | POA: Diagnosis not present

## 2022-05-11 DIAGNOSIS — N186 End stage renal disease: Secondary | ICD-10-CM | POA: Diagnosis not present

## 2022-05-11 DIAGNOSIS — E876 Hypokalemia: Secondary | ICD-10-CM | POA: Diagnosis not present

## 2022-05-11 DIAGNOSIS — D509 Iron deficiency anemia, unspecified: Secondary | ICD-10-CM | POA: Diagnosis not present

## 2022-05-12 DIAGNOSIS — S22068G Other fracture of T7-T8 thoracic vertebra, subsequent encounter for fracture with delayed healing: Secondary | ICD-10-CM | POA: Diagnosis not present

## 2022-05-12 DIAGNOSIS — M546 Pain in thoracic spine: Secondary | ICD-10-CM | POA: Diagnosis not present

## 2022-05-12 DIAGNOSIS — R278 Other lack of coordination: Secondary | ICD-10-CM | POA: Diagnosis not present

## 2022-05-12 DIAGNOSIS — R2689 Other abnormalities of gait and mobility: Secondary | ICD-10-CM | POA: Diagnosis not present

## 2022-05-12 DIAGNOSIS — M6281 Muscle weakness (generalized): Secondary | ICD-10-CM | POA: Diagnosis not present

## 2022-05-12 DIAGNOSIS — R293 Abnormal posture: Secondary | ICD-10-CM | POA: Diagnosis not present

## 2022-05-13 ENCOUNTER — Encounter: Payer: PPO | Admitting: Family Medicine

## 2022-05-16 DIAGNOSIS — Z992 Dependence on renal dialysis: Secondary | ICD-10-CM | POA: Diagnosis not present

## 2022-05-16 DIAGNOSIS — N186 End stage renal disease: Secondary | ICD-10-CM | POA: Diagnosis not present

## 2022-05-16 DIAGNOSIS — N2581 Secondary hyperparathyroidism of renal origin: Secondary | ICD-10-CM | POA: Diagnosis not present

## 2022-05-16 DIAGNOSIS — D509 Iron deficiency anemia, unspecified: Secondary | ICD-10-CM | POA: Diagnosis not present

## 2022-05-17 DIAGNOSIS — M546 Pain in thoracic spine: Secondary | ICD-10-CM | POA: Diagnosis not present

## 2022-05-17 DIAGNOSIS — S22068G Other fracture of T7-T8 thoracic vertebra, subsequent encounter for fracture with delayed healing: Secondary | ICD-10-CM | POA: Diagnosis not present

## 2022-05-17 DIAGNOSIS — R2689 Other abnormalities of gait and mobility: Secondary | ICD-10-CM | POA: Diagnosis not present

## 2022-05-17 DIAGNOSIS — R293 Abnormal posture: Secondary | ICD-10-CM | POA: Diagnosis not present

## 2022-05-17 DIAGNOSIS — R278 Other lack of coordination: Secondary | ICD-10-CM | POA: Diagnosis not present

## 2022-05-17 DIAGNOSIS — M6281 Muscle weakness (generalized): Secondary | ICD-10-CM | POA: Diagnosis not present

## 2022-05-19 DIAGNOSIS — M546 Pain in thoracic spine: Secondary | ICD-10-CM | POA: Diagnosis not present

## 2022-05-19 DIAGNOSIS — R2689 Other abnormalities of gait and mobility: Secondary | ICD-10-CM | POA: Diagnosis not present

## 2022-05-19 DIAGNOSIS — M6281 Muscle weakness (generalized): Secondary | ICD-10-CM | POA: Diagnosis not present

## 2022-05-19 DIAGNOSIS — S22068G Other fracture of T7-T8 thoracic vertebra, subsequent encounter for fracture with delayed healing: Secondary | ICD-10-CM | POA: Diagnosis not present

## 2022-05-19 DIAGNOSIS — R293 Abnormal posture: Secondary | ICD-10-CM | POA: Diagnosis not present

## 2022-05-19 DIAGNOSIS — R278 Other lack of coordination: Secondary | ICD-10-CM | POA: Diagnosis not present

## 2022-05-23 DIAGNOSIS — N2581 Secondary hyperparathyroidism of renal origin: Secondary | ICD-10-CM | POA: Diagnosis not present

## 2022-05-23 DIAGNOSIS — E876 Hypokalemia: Secondary | ICD-10-CM | POA: Diagnosis not present

## 2022-05-23 DIAGNOSIS — Z992 Dependence on renal dialysis: Secondary | ICD-10-CM | POA: Diagnosis not present

## 2022-05-23 DIAGNOSIS — N186 End stage renal disease: Secondary | ICD-10-CM | POA: Diagnosis not present

## 2022-05-23 DIAGNOSIS — D509 Iron deficiency anemia, unspecified: Secondary | ICD-10-CM | POA: Diagnosis not present

## 2022-05-23 DIAGNOSIS — D631 Anemia in chronic kidney disease: Secondary | ICD-10-CM | POA: Diagnosis not present

## 2022-05-24 DIAGNOSIS — R2689 Other abnormalities of gait and mobility: Secondary | ICD-10-CM | POA: Diagnosis not present

## 2022-05-24 DIAGNOSIS — M546 Pain in thoracic spine: Secondary | ICD-10-CM | POA: Diagnosis not present

## 2022-05-24 DIAGNOSIS — M47812 Spondylosis without myelopathy or radiculopathy, cervical region: Secondary | ICD-10-CM | POA: Diagnosis not present

## 2022-05-24 DIAGNOSIS — M542 Cervicalgia: Secondary | ICD-10-CM | POA: Diagnosis not present

## 2022-05-24 DIAGNOSIS — F1120 Opioid dependence, uncomplicated: Secondary | ICD-10-CM | POA: Diagnosis not present

## 2022-05-24 DIAGNOSIS — M6281 Muscle weakness (generalized): Secondary | ICD-10-CM | POA: Diagnosis not present

## 2022-05-24 DIAGNOSIS — G894 Chronic pain syndrome: Secondary | ICD-10-CM | POA: Diagnosis not present

## 2022-05-26 DIAGNOSIS — E1122 Type 2 diabetes mellitus with diabetic chronic kidney disease: Secondary | ICD-10-CM | POA: Diagnosis not present

## 2022-05-26 DIAGNOSIS — R2689 Other abnormalities of gait and mobility: Secondary | ICD-10-CM | POA: Diagnosis not present

## 2022-05-26 DIAGNOSIS — M546 Pain in thoracic spine: Secondary | ICD-10-CM | POA: Diagnosis not present

## 2022-05-26 DIAGNOSIS — N186 End stage renal disease: Secondary | ICD-10-CM | POA: Diagnosis not present

## 2022-05-26 DIAGNOSIS — J301 Allergic rhinitis due to pollen: Secondary | ICD-10-CM | POA: Diagnosis not present

## 2022-05-26 DIAGNOSIS — M6281 Muscle weakness (generalized): Secondary | ICD-10-CM | POA: Diagnosis not present

## 2022-05-26 DIAGNOSIS — B372 Candidiasis of skin and nail: Secondary | ICD-10-CM | POA: Diagnosis not present

## 2022-05-26 DIAGNOSIS — Z992 Dependence on renal dialysis: Secondary | ICD-10-CM | POA: Diagnosis not present

## 2022-05-30 DIAGNOSIS — E876 Hypokalemia: Secondary | ICD-10-CM | POA: Diagnosis not present

## 2022-05-30 DIAGNOSIS — D509 Iron deficiency anemia, unspecified: Secondary | ICD-10-CM | POA: Diagnosis not present

## 2022-05-30 DIAGNOSIS — Z992 Dependence on renal dialysis: Secondary | ICD-10-CM | POA: Diagnosis not present

## 2022-05-30 DIAGNOSIS — N186 End stage renal disease: Secondary | ICD-10-CM | POA: Diagnosis not present

## 2022-05-30 DIAGNOSIS — N2581 Secondary hyperparathyroidism of renal origin: Secondary | ICD-10-CM | POA: Diagnosis not present

## 2022-05-31 DIAGNOSIS — S22068G Other fracture of T7-T8 thoracic vertebra, subsequent encounter for fracture with delayed healing: Secondary | ICD-10-CM | POA: Diagnosis not present

## 2022-05-31 DIAGNOSIS — M546 Pain in thoracic spine: Secondary | ICD-10-CM | POA: Diagnosis not present

## 2022-05-31 DIAGNOSIS — G894 Chronic pain syndrome: Secondary | ICD-10-CM | POA: Diagnosis not present

## 2022-05-31 DIAGNOSIS — M6281 Muscle weakness (generalized): Secondary | ICD-10-CM | POA: Diagnosis not present

## 2022-05-31 DIAGNOSIS — R293 Abnormal posture: Secondary | ICD-10-CM | POA: Diagnosis not present

## 2022-05-31 DIAGNOSIS — K2100 Gastro-esophageal reflux disease with esophagitis, without bleeding: Secondary | ICD-10-CM | POA: Diagnosis not present

## 2022-05-31 DIAGNOSIS — M542 Cervicalgia: Secondary | ICD-10-CM | POA: Diagnosis not present

## 2022-05-31 DIAGNOSIS — R2689 Other abnormalities of gait and mobility: Secondary | ICD-10-CM | POA: Diagnosis not present

## 2022-05-31 DIAGNOSIS — G2581 Restless legs syndrome: Secondary | ICD-10-CM | POA: Diagnosis not present

## 2022-05-31 DIAGNOSIS — R278 Other lack of coordination: Secondary | ICD-10-CM | POA: Diagnosis not present

## 2022-06-02 DIAGNOSIS — M546 Pain in thoracic spine: Secondary | ICD-10-CM | POA: Diagnosis not present

## 2022-06-02 DIAGNOSIS — R2689 Other abnormalities of gait and mobility: Secondary | ICD-10-CM | POA: Diagnosis not present

## 2022-06-02 DIAGNOSIS — R278 Other lack of coordination: Secondary | ICD-10-CM | POA: Diagnosis not present

## 2022-06-02 DIAGNOSIS — R293 Abnormal posture: Secondary | ICD-10-CM | POA: Diagnosis not present

## 2022-06-02 DIAGNOSIS — S22068G Other fracture of T7-T8 thoracic vertebra, subsequent encounter for fracture with delayed healing: Secondary | ICD-10-CM | POA: Diagnosis not present

## 2022-06-02 DIAGNOSIS — M6281 Muscle weakness (generalized): Secondary | ICD-10-CM | POA: Diagnosis not present

## 2022-06-06 DIAGNOSIS — N186 End stage renal disease: Secondary | ICD-10-CM | POA: Diagnosis not present

## 2022-06-06 DIAGNOSIS — E876 Hypokalemia: Secondary | ICD-10-CM | POA: Diagnosis not present

## 2022-06-06 DIAGNOSIS — N2581 Secondary hyperparathyroidism of renal origin: Secondary | ICD-10-CM | POA: Diagnosis not present

## 2022-06-06 DIAGNOSIS — Z992 Dependence on renal dialysis: Secondary | ICD-10-CM | POA: Diagnosis not present

## 2022-06-06 DIAGNOSIS — D631 Anemia in chronic kidney disease: Secondary | ICD-10-CM | POA: Diagnosis not present

## 2022-06-07 DIAGNOSIS — M6281 Muscle weakness (generalized): Secondary | ICD-10-CM | POA: Diagnosis not present

## 2022-06-07 DIAGNOSIS — R2689 Other abnormalities of gait and mobility: Secondary | ICD-10-CM | POA: Diagnosis not present

## 2022-06-07 DIAGNOSIS — R278 Other lack of coordination: Secondary | ICD-10-CM | POA: Diagnosis not present

## 2022-06-07 DIAGNOSIS — S22068G Other fracture of T7-T8 thoracic vertebra, subsequent encounter for fracture with delayed healing: Secondary | ICD-10-CM | POA: Diagnosis not present

## 2022-06-07 DIAGNOSIS — R293 Abnormal posture: Secondary | ICD-10-CM | POA: Diagnosis not present

## 2022-06-07 DIAGNOSIS — M546 Pain in thoracic spine: Secondary | ICD-10-CM | POA: Diagnosis not present

## 2022-06-09 DIAGNOSIS — R278 Other lack of coordination: Secondary | ICD-10-CM | POA: Diagnosis not present

## 2022-06-09 DIAGNOSIS — Z992 Dependence on renal dialysis: Secondary | ICD-10-CM | POA: Diagnosis not present

## 2022-06-09 DIAGNOSIS — G8929 Other chronic pain: Secondary | ICD-10-CM | POA: Diagnosis not present

## 2022-06-09 DIAGNOSIS — M542 Cervicalgia: Secondary | ICD-10-CM | POA: Diagnosis not present

## 2022-06-09 DIAGNOSIS — R293 Abnormal posture: Secondary | ICD-10-CM | POA: Diagnosis not present

## 2022-06-09 DIAGNOSIS — M6281 Muscle weakness (generalized): Secondary | ICD-10-CM | POA: Diagnosis not present

## 2022-06-09 DIAGNOSIS — S22068G Other fracture of T7-T8 thoracic vertebra, subsequent encounter for fracture with delayed healing: Secondary | ICD-10-CM | POA: Diagnosis not present

## 2022-06-09 DIAGNOSIS — E1022 Type 1 diabetes mellitus with diabetic chronic kidney disease: Secondary | ICD-10-CM | POA: Diagnosis not present

## 2022-06-09 DIAGNOSIS — M545 Low back pain, unspecified: Secondary | ICD-10-CM | POA: Diagnosis not present

## 2022-06-09 DIAGNOSIS — N186 End stage renal disease: Secondary | ICD-10-CM | POA: Diagnosis not present

## 2022-06-09 DIAGNOSIS — R2689 Other abnormalities of gait and mobility: Secondary | ICD-10-CM | POA: Diagnosis not present

## 2022-06-09 DIAGNOSIS — M546 Pain in thoracic spine: Secondary | ICD-10-CM | POA: Diagnosis not present

## 2022-06-10 DIAGNOSIS — Z992 Dependence on renal dialysis: Secondary | ICD-10-CM | POA: Diagnosis not present

## 2022-06-10 DIAGNOSIS — N186 End stage renal disease: Secondary | ICD-10-CM | POA: Diagnosis not present

## 2022-06-10 DIAGNOSIS — N2581 Secondary hyperparathyroidism of renal origin: Secondary | ICD-10-CM | POA: Diagnosis not present

## 2022-06-13 DIAGNOSIS — Z992 Dependence on renal dialysis: Secondary | ICD-10-CM | POA: Diagnosis not present

## 2022-06-13 DIAGNOSIS — N186 End stage renal disease: Secondary | ICD-10-CM | POA: Diagnosis not present

## 2022-06-13 DIAGNOSIS — E876 Hypokalemia: Secondary | ICD-10-CM | POA: Diagnosis not present

## 2022-06-13 DIAGNOSIS — N2581 Secondary hyperparathyroidism of renal origin: Secondary | ICD-10-CM | POA: Diagnosis not present

## 2022-06-14 DIAGNOSIS — G894 Chronic pain syndrome: Secondary | ICD-10-CM | POA: Diagnosis not present

## 2022-06-14 DIAGNOSIS — G542 Cervical root disorders, not elsewhere classified: Secondary | ICD-10-CM | POA: Diagnosis not present

## 2022-06-14 DIAGNOSIS — G2581 Restless legs syndrome: Secondary | ICD-10-CM | POA: Diagnosis not present

## 2022-06-20 DIAGNOSIS — Z992 Dependence on renal dialysis: Secondary | ICD-10-CM | POA: Diagnosis not present

## 2022-06-20 DIAGNOSIS — D631 Anemia in chronic kidney disease: Secondary | ICD-10-CM | POA: Diagnosis not present

## 2022-06-20 DIAGNOSIS — N186 End stage renal disease: Secondary | ICD-10-CM | POA: Diagnosis not present

## 2022-06-20 DIAGNOSIS — N2581 Secondary hyperparathyroidism of renal origin: Secondary | ICD-10-CM | POA: Diagnosis not present

## 2022-06-21 ENCOUNTER — Encounter: Payer: Self-pay | Admitting: Family Medicine

## 2022-06-21 ENCOUNTER — Non-Acute Institutional Stay: Payer: HMO | Admitting: Family Medicine

## 2022-06-21 VITALS — HR 61 | Resp 20 | Wt 163.0 lb

## 2022-06-21 DIAGNOSIS — R682 Dry mouth, unspecified: Secondary | ICD-10-CM

## 2022-06-21 DIAGNOSIS — E0840 Diabetes mellitus due to underlying condition with diabetic neuropathy, unspecified: Secondary | ICD-10-CM | POA: Diagnosis not present

## 2022-06-21 DIAGNOSIS — E1165 Type 2 diabetes mellitus with hyperglycemia: Secondary | ICD-10-CM | POA: Diagnosis not present

## 2022-06-21 DIAGNOSIS — I10 Essential (primary) hypertension: Secondary | ICD-10-CM | POA: Diagnosis not present

## 2022-06-21 DIAGNOSIS — N186 End stage renal disease: Secondary | ICD-10-CM | POA: Diagnosis not present

## 2022-06-21 DIAGNOSIS — L89309 Pressure ulcer of unspecified buttock, unspecified stage: Secondary | ICD-10-CM

## 2022-06-21 DIAGNOSIS — E441 Mild protein-calorie malnutrition: Secondary | ICD-10-CM

## 2022-06-21 DIAGNOSIS — E7800 Pure hypercholesterolemia, unspecified: Secondary | ICD-10-CM | POA: Diagnosis not present

## 2022-06-21 NOTE — Progress Notes (Unsigned)
Advance Consult Note Telephone: [phone]  Fax: [phone]    Date of encounter: 06/21/22 10:55 AM PATIENT NAME: Jean Mcclain [address] [place] [address]   [id number] (home)  DOB: 27-Apr-1946 MRN: [id number] PRIMARY CARE PROVIDER:    Pcp, No,  No address on file None  REFERRING PROVIDER:   No referring provider defined for this encounter. N/A  RESPONSIBLE PARTY:    Contact Information     Name Relation Home Work Mobile   Gold,Bernard Brother [phone]     Kandice Hams Friend   [phone]        I met face to face with patient in her assisted living facility. Palliative Care was asked to follow this patient by consultation request of  No ref. provider found to address advance care planning and complex medical decision making. This is a follow up visit   ASSESSMENT , SYMPTOM MANAGEMENT AND PLAN / RECOMMENDATIONS:   Mild protein calorie malnutrition Weight loss 8 lbs in last month despite taking Remeron Encourage increase of her nephro carb steady supplements to TID if unable to eat.   Pressure injury of buttocks, unspecified Need to maximize nutrition to improve albumin for healing. Recommend use of barrier protective cream and padded dressing over bony prominence, change Q 3 days or prn if soiled. Recommend addition of pressure reduction surface to any chair where pt sits and having her sit for no more than 2 hours at a time before offloading area.  3.  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 Anticholinergic meds or meds causing dry mouth:  Butrans, Sinemet IR (1), Gabapentin, hydroxyzine (3), Claritin (2), Promethazine (3). Anticholinergic burden score (ACB score) = 9  Recommendations (would decrease ACB score to 1 unless prn hydroxyzine needed and then would  be 4) :  D/C Claritin, keep flonase and add saline nasal spray prn. Keep Butrans, Sinemet IR and Gabapentin. D/C scheduled hydroxyzine and give low dose prn (25 mg no more than BID prn) D/C promethazine and add Ondansetron ODT prn   Advance Care Planning/Goals of Care: Goals include to maximize quality of life and symptom management. Patient/health care surrogate gave his/her permission to discuss. Our advance care planning conversation included a discussion about:    Exploration of goals of care in the event of a sudden injury or illness-not interested in feeding tube but wants to continue hemodialysis Identification of a healthcare agent-brother Review of an advance directive document-MOST. CODE STATUS: MOST as of 76/07/2022: Attempt CPR/DNI with limited additional intervention Antibiotics and IV fluids as indicated      Follow up Palliative Care Visit: Palliative care will continue to follow for complex medical decision making, advance care planning, and clarification of goals. Return 4 weeks or prn.    This visit was coded based on medical decision making (MDM).  PPS: 50%  HOSPICE ELIGIBILITY/DIAGNOSIS: TBD  Chief Complaint:  Palliative Care is following for chronic management in setting of ESRD on hemodialysis.  Palliative Care is also following to assist with review/refining goals of care.  HISTORY OF PRESENT ILLNESS:  [name] is a 76 y.o. year old female with chronic respiratory failure with hypoxia and hypercapnia, OSA, HTN, GERD, obstipation, DM type 2 insulin dependent, secondary hyperparathyroidism of renal origin, OA of left leg, thoracic spine fracture, pressure injury of buttocks, ESRD on hemodialysis,  anemia of CKD, lymphedema, bilateral shoulder and knee pain, gout, hx of DVT in LLE.  Pt c/o not being able to eat, dry mouth.  Has gagging, nausea and spitting up clear fluid despite drinking lots of fluids and sucking on sugar free candy.  Pt c/o postnasal drip  and started on medicine but doesn't seem to be helping.  Able to eat ice cream, creamcicle, drink fluids and eat an orange.  She does have some labile Bps during dialysis but states her labs have been stable and she has been offered to come off the machine early once per month.  Takes Midodrine to boost BP for HD.  She c/o some lightheadedness during treatments but wants to continue with dialysis. Pt c/o pain in buttocks from [place] seat and redness.  States renal function stable, continues to make some urine, denies fever, chills, dysuria.  Reports weight loss.  Denies CP, SOB and falls.  Has some constipation.  States she has tried artificial saliva previously but wasn't helpful. States meds for nausea are somewhat helpful at times.    History obtained from review of EMR, discussion with Jean Mcclain.   I reviewed EMR for available labs, medications, imaging, studies and related documents.  There are no new records since last visit.   ROS General: NAD ENMT: denies dysphagia Cardiovascular: denies chest pain, denies DOE Pulmonary: denies cough, denies increased SOB Abdomen: endorses poor appetite and inability to eat more than "bites for a while now", denies constipation, endorses continence of bowel GU: denies dysuria, endorses continence of urine MSK:  endorses increased weakness, no falls reported Skin: wound of buttocks and sacrum per pt and states she has been told there is no skin breakdown it is just red Neurological: endorses pain in buttocks, denies insomnia Psych: Endorses depressed mood Heme/lymph/immuno: denies bruises, abnormal bleeding  Physical Exam: Current and past weights: 163 lbs last week at dialysis, 171 lbs 05/24/22 at [facility] office Constitutional: NAD General: frail appearing,/obese  ENMT: intact hearing, oral mucous membranes tacky CV: S1S2, RRR, 2+ bilateral pedal edema/1+ BLE Pulmonary: CTAB, no increased work of breathing, no cough, room air Abdomen:   normo-active BS + 4 quadrants, soft and non tender MSK: no sarcopenia, moves all extremities, uses power WC for mobility Skin: warm and dry, no rashes or wounds on visible skin.  Deferred exam of buttocks as pt had MD appt with Endocrinologist and was already dressed/seated in power chair.  Has freestyle libre transmitted on upper left arm Neuro:  no generalized weakness,  no cognitive impairment Psych: non-anxious affect, A and O x 3 Hem/lymph/immuno: no widespread bruising   Thank you for the opportunity to participate in the care of Jean Mcclain.  The palliative care team will continue to follow. Please call our office at [phone] if we can be of additional assistance.   Marijo Conception, FNP -C  COVID-19 PATIENT SCREENING TOOL Asked and negative response unless otherwise noted:   Have you had symptoms of covid, tested positive or been in contact with someone with symptoms/positive test in the past 5-10 days?  No

## 2022-06-22 ENCOUNTER — Encounter: Payer: Self-pay | Admitting: Family Medicine

## 2022-06-22 DIAGNOSIS — R682 Dry mouth, unspecified: Secondary | ICD-10-CM | POA: Insufficient documentation

## 2022-06-23 DIAGNOSIS — G20 Parkinson's disease: Secondary | ICD-10-CM | POA: Diagnosis not present

## 2022-06-23 DIAGNOSIS — E119 Type 2 diabetes mellitus without complications: Secondary | ICD-10-CM | POA: Diagnosis not present

## 2022-06-23 DIAGNOSIS — M47812 Spondylosis without myelopathy or radiculopathy, cervical region: Secondary | ICD-10-CM | POA: Diagnosis not present

## 2022-06-23 DIAGNOSIS — I10 Essential (primary) hypertension: Secondary | ICD-10-CM | POA: Diagnosis not present

## 2022-06-23 DIAGNOSIS — Z96653 Presence of artificial knee joint, bilateral: Secondary | ICD-10-CM | POA: Diagnosis not present

## 2022-06-23 DIAGNOSIS — G4733 Obstructive sleep apnea (adult) (pediatric): Secondary | ICD-10-CM | POA: Diagnosis not present

## 2022-06-23 DIAGNOSIS — K219 Gastro-esophageal reflux disease without esophagitis: Secondary | ICD-10-CM | POA: Diagnosis not present

## 2022-06-27 DIAGNOSIS — N2581 Secondary hyperparathyroidism of renal origin: Secondary | ICD-10-CM | POA: Diagnosis not present

## 2022-06-27 DIAGNOSIS — Z992 Dependence on renal dialysis: Secondary | ICD-10-CM | POA: Diagnosis not present

## 2022-06-27 DIAGNOSIS — E876 Hypokalemia: Secondary | ICD-10-CM | POA: Diagnosis not present

## 2022-06-27 DIAGNOSIS — N186 End stage renal disease: Secondary | ICD-10-CM | POA: Diagnosis not present

## 2022-06-28 DIAGNOSIS — R682 Dry mouth, unspecified: Secondary | ICD-10-CM | POA: Diagnosis not present

## 2022-06-28 DIAGNOSIS — K219 Gastro-esophageal reflux disease without esophagitis: Secondary | ICD-10-CM | POA: Diagnosis not present

## 2022-06-28 DIAGNOSIS — R110 Nausea: Secondary | ICD-10-CM | POA: Diagnosis not present

## 2022-07-01 DIAGNOSIS — R0982 Postnasal drip: Secondary | ICD-10-CM | POA: Diagnosis not present

## 2022-07-04 DIAGNOSIS — D631 Anemia in chronic kidney disease: Secondary | ICD-10-CM | POA: Diagnosis not present

## 2022-07-04 DIAGNOSIS — N186 End stage renal disease: Secondary | ICD-10-CM | POA: Diagnosis not present

## 2022-07-04 DIAGNOSIS — N2581 Secondary hyperparathyroidism of renal origin: Secondary | ICD-10-CM | POA: Diagnosis not present

## 2022-07-04 DIAGNOSIS — E876 Hypokalemia: Secondary | ICD-10-CM | POA: Diagnosis not present

## 2022-07-04 DIAGNOSIS — Z992 Dependence on renal dialysis: Secondary | ICD-10-CM | POA: Diagnosis not present

## 2022-07-05 ENCOUNTER — Encounter: Payer: Self-pay | Admitting: Podiatry

## 2022-07-05 ENCOUNTER — Ambulatory Visit (INDEPENDENT_AMBULATORY_CARE_PROVIDER_SITE_OTHER): Payer: HMO | Admitting: Podiatry

## 2022-07-05 DIAGNOSIS — B351 Tinea unguium: Secondary | ICD-10-CM | POA: Diagnosis not present

## 2022-07-05 DIAGNOSIS — E0842 Diabetes mellitus due to underlying condition with diabetic polyneuropathy: Secondary | ICD-10-CM | POA: Diagnosis not present

## 2022-07-05 DIAGNOSIS — M79676 Pain in unspecified toe(s): Secondary | ICD-10-CM

## 2022-07-05 NOTE — Progress Notes (Signed)
  Subjective:  Patient ID: Jean Mcclain, female    DOB: [birth year],   MRN: [id number]  Chief Complaint  Patient presents with   routine diabetic foot care    76 y.o. female presents for concern of thickened elongated and painful nails that are difficult to trim. Requesting to have them trimmed today. Relates burning and tingling in their feet. Patient is diabetic and last A1c was  Lab Results  Component Value Date   HGBA1C 7.2 (H) 01/09/2022   .   PCP:  Pcp, No    . Denies any other pedal complaints. Denies n/v/f/c.   Past Medical History:  Diagnosis Date   Acute and chronic respiratory failure (acute-on-chronic) (HCC)    Acute deep vein thrombosis (DVT) of popliteal vein of left lower extremity ([place]) 03/11/2017   Anxiety    Arthritis    osteo   Chronic kidney disease    Diabetes mellitus, type 2 (HCC)    Type II   DVT (deep venous thrombosis) ([place]) 03/2017   left popliteal   Facet joint disease    Facet joint disease    foot   Fibula fracture 02/2017   left   GERD (gastroesophageal reflux disease)    H/O blood clots    Hyperlipidemia    Hypertension    Macrocytic anemia    Morbid obesity (HCC)    OSA (obstructive sleep apnea)    does not use CPAP    Osteopenia    Renal disorder    MWF dialysis   Shortness of breath    with exertion   Supplemental oxygen dependent    Venous stasis ulcers (HCC)    Vitamin D deficiency     Objective:  Physical Exam: Vascular: DP/PT pulses 2/4 bilateral. CFT <3 seconds. Absent hair growth on digits. Edema noted to bilateral lower extremities. Xerosis noted bilaterally.  Skin. No lacerations or abrasions bilateral feet. Nails 1-5 bilateral  are thickened discolored and elongated with subungual debris.  Musculoskeletal: MMT 5/5 bilateral lower extremities in DF, PF, Inversion and Eversion. Deceased ROM in DF of ankle joint.  Neurological: Sensation intact to light touch. Protective sensation diminished bilateral.    Assessment:    1. Pain due to onychomycosis of toenail   2. 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 -Discussed and educated patient on diabetic foot care, especially with  regards to the vascular, neurological and musculoskeletal systems.  -Stressed the importance of good glycemic control and the detriment of not  controlling glucose levels in relation to the foot. -Discussed supportive shoes at all times and checking feet regularly.  -Mechanically debrided all nails 1-5 bilateral using sterile nail nipper and filed with dremel without incident  -Answered all patient questions -Patient to return  in 3 months for at risk foot care -Patient advised to call the office if any problems or questions arise in the meantime.   Lorenda Peck, DPM

## 2022-07-09 DIAGNOSIS — Z992 Dependence on renal dialysis: Secondary | ICD-10-CM | POA: Diagnosis not present

## 2022-07-09 DIAGNOSIS — E1022 Type 1 diabetes mellitus with diabetic chronic kidney disease: Secondary | ICD-10-CM | POA: Diagnosis not present

## 2022-07-09 DIAGNOSIS — N186 End stage renal disease: Secondary | ICD-10-CM | POA: Diagnosis not present

## 2022-07-21 ENCOUNTER — Emergency Department (HOSPITAL_COMMUNITY): Payer: HMO

## 2022-07-21 ENCOUNTER — Encounter (HOSPITAL_COMMUNITY): Payer: Self-pay | Admitting: Emergency Medicine

## 2022-07-21 ENCOUNTER — Other Ambulatory Visit: Payer: Self-pay

## 2022-07-21 ENCOUNTER — Inpatient Hospital Stay (HOSPITAL_COMMUNITY)
Admission: EM | Admit: 2022-07-21 | Discharge: 2022-07-28 | DRG: 391 | Disposition: A | Discharge status: Nursing Home (Includes ICF) | Payer: HMO | Source: Skilled Nursing Facility | Attending: Internal Medicine | Admitting: Internal Medicine

## 2022-07-21 DIAGNOSIS — E0781 Sick-euthyroid syndrome: Secondary | ICD-10-CM | POA: Diagnosis present

## 2022-07-21 DIAGNOSIS — H538 Other visual disturbances: Secondary | ICD-10-CM | POA: Diagnosis present

## 2022-07-21 DIAGNOSIS — R55 Syncope and collapse: Principal | ICD-10-CM

## 2022-07-21 DIAGNOSIS — M8580 Other specified disorders of bone density and structure, unspecified site: Secondary | ICD-10-CM | POA: Diagnosis present

## 2022-07-21 DIAGNOSIS — E119 Type 2 diabetes mellitus without complications: Secondary | ICD-10-CM

## 2022-07-21 DIAGNOSIS — R110 Nausea: Secondary | ICD-10-CM

## 2022-07-21 DIAGNOSIS — Z86718 Personal history of other venous thrombosis and embolism: Secondary | ICD-10-CM

## 2022-07-21 DIAGNOSIS — K219 Gastro-esophageal reflux disease without esophagitis: Secondary | ICD-10-CM | POA: Diagnosis present

## 2022-07-21 DIAGNOSIS — Z66 Do not resuscitate: Secondary | ICD-10-CM | POA: Diagnosis present

## 2022-07-21 DIAGNOSIS — Z992 Dependence on renal dialysis: Secondary | ICD-10-CM

## 2022-07-21 DIAGNOSIS — Z981 Arthrodesis status: Secondary | ICD-10-CM

## 2022-07-21 DIAGNOSIS — J9610 Chronic respiratory failure, unspecified whether with hypoxia or hypercapnia: Secondary | ICD-10-CM | POA: Diagnosis present

## 2022-07-21 DIAGNOSIS — R6339 Other feeding difficulties: Secondary | ICD-10-CM

## 2022-07-21 DIAGNOSIS — R112 Nausea with vomiting, unspecified: Secondary | ICD-10-CM

## 2022-07-21 DIAGNOSIS — Z79899 Other long term (current) drug therapy: Secondary | ICD-10-CM

## 2022-07-21 DIAGNOSIS — Z6834 Body mass index (BMI) 34.0-34.9, adult: Secondary | ICD-10-CM

## 2022-07-21 DIAGNOSIS — F419 Anxiety disorder, unspecified: Secondary | ICD-10-CM | POA: Diagnosis present

## 2022-07-21 DIAGNOSIS — Z9981 Dependence on supplemental oxygen: Secondary | ICD-10-CM

## 2022-07-21 DIAGNOSIS — K5792 Diverticulitis of intestine, part unspecified, without perforation or abscess without bleeding: Secondary | ICD-10-CM

## 2022-07-21 DIAGNOSIS — I953 Hypotension of hemodialysis: Secondary | ICD-10-CM | POA: Diagnosis present

## 2022-07-21 DIAGNOSIS — Z8249 Family history of ischemic heart disease and other diseases of the circulatory system: Secondary | ICD-10-CM

## 2022-07-21 DIAGNOSIS — Z885 Allergy status to narcotic agent status: Secondary | ICD-10-CM

## 2022-07-21 DIAGNOSIS — M47812 Spondylosis without myelopathy or radiculopathy, cervical region: Secondary | ICD-10-CM | POA: Diagnosis present

## 2022-07-21 DIAGNOSIS — Z7901 Long term (current) use of anticoagulants: Secondary | ICD-10-CM

## 2022-07-21 DIAGNOSIS — N186 End stage renal disease: Secondary | ICD-10-CM | POA: Diagnosis present

## 2022-07-21 DIAGNOSIS — K5780 Diverticulitis of intestine, part unspecified, with perforation and abscess without bleeding: Secondary | ICD-10-CM | POA: Diagnosis present

## 2022-07-21 DIAGNOSIS — E669 Obesity, unspecified: Secondary | ICD-10-CM | POA: Diagnosis present

## 2022-07-21 DIAGNOSIS — M542 Cervicalgia: Secondary | ICD-10-CM | POA: Diagnosis present

## 2022-07-21 DIAGNOSIS — G4733 Obstructive sleep apnea (adult) (pediatric): Secondary | ICD-10-CM | POA: Diagnosis present

## 2022-07-21 DIAGNOSIS — I120 Hypertensive chronic kidney disease with stage 5 chronic kidney disease or end stage renal disease: Secondary | ICD-10-CM | POA: Diagnosis present

## 2022-07-21 DIAGNOSIS — E559 Vitamin D deficiency, unspecified: Secondary | ICD-10-CM | POA: Diagnosis present

## 2022-07-21 DIAGNOSIS — D631 Anemia in chronic kidney disease: Secondary | ICD-10-CM | POA: Diagnosis present

## 2022-07-21 DIAGNOSIS — K5720 Diverticulitis of large intestine with perforation and abscess without bleeding: Secondary | ICD-10-CM | POA: Diagnosis not present

## 2022-07-21 DIAGNOSIS — G20A1 Parkinson's disease without dyskinesia, without mention of fluctuations: Secondary | ICD-10-CM | POA: Diagnosis present

## 2022-07-21 DIAGNOSIS — E1122 Type 2 diabetes mellitus with diabetic chronic kidney disease: Secondary | ICD-10-CM | POA: Diagnosis present

## 2022-07-21 DIAGNOSIS — E785 Hyperlipidemia, unspecified: Secondary | ICD-10-CM | POA: Diagnosis present

## 2022-07-21 DIAGNOSIS — Z96652 Presence of left artificial knee joint: Secondary | ICD-10-CM | POA: Diagnosis present

## 2022-07-21 DIAGNOSIS — N189 Chronic kidney disease, unspecified: Secondary | ICD-10-CM | POA: Diagnosis present

## 2022-07-21 DIAGNOSIS — K5900 Constipation, unspecified: Secondary | ICD-10-CM | POA: Diagnosis present

## 2022-07-21 DIAGNOSIS — R682 Dry mouth, unspecified: Secondary | ICD-10-CM | POA: Diagnosis not present

## 2022-07-21 DIAGNOSIS — H532 Diplopia: Secondary | ICD-10-CM | POA: Diagnosis present

## 2022-07-21 DIAGNOSIS — N289 Disorder of kidney and ureter, unspecified: Secondary | ICD-10-CM

## 2022-07-21 DIAGNOSIS — Z87891 Personal history of nicotine dependence: Secondary | ICD-10-CM

## 2022-07-21 DIAGNOSIS — Z794 Long term (current) use of insulin: Secondary | ICD-10-CM

## 2022-07-21 DIAGNOSIS — E876 Hypokalemia: Secondary | ICD-10-CM | POA: Diagnosis not present

## 2022-07-21 HISTORY — DX: Dependence on renal dialysis: N18.6

## 2022-07-21 HISTORY — DX: End stage renal disease: Z99.2

## 2022-07-21 LAB — CBC WITH DIFFERENTIAL/PLATELET
Abs Immature Granulocytes: 0.13 10*3/uL — ABNORMAL HIGH (ref 0.00–0.07)
Basophils Absolute: 0 10*3/uL (ref 0.0–0.1)
Basophils Relative: 0 %
Eosinophils Absolute: 0.1 10*3/uL (ref 0.0–0.5)
Eosinophils Relative: 1 %
HCT: 26.3 % — ABNORMAL LOW (ref 36.0–46.0)
Hemoglobin: 7.8 g/dL — ABNORMAL LOW (ref 12.0–15.0)
Immature Granulocytes: 1 %
Lymphocytes Relative: 6 %
Lymphs Abs: 0.7 10*3/uL (ref 0.7–4.0)
MCH: 25.9 pg — ABNORMAL LOW (ref 26.0–34.0)
MCHC: 29.7 g/dL — ABNORMAL LOW (ref 30.0–36.0)
MCV: 87.4 fL (ref 80.0–100.0)
Monocytes Absolute: 0.5 10*3/uL (ref 0.1–1.0)
Monocytes Relative: 5 %
Neutro Abs: 9.7 10*3/uL — ABNORMAL HIGH (ref 1.7–7.7)
Neutrophils Relative %: 87 %
Platelets: 241 10*3/uL (ref 150–400)
RBC: 3.01 MIL/uL — ABNORMAL LOW (ref 3.87–5.11)
RDW: 24 % — ABNORMAL HIGH (ref 11.5–15.5)
WBC: 11.1 10*3/uL — ABNORMAL HIGH (ref 4.0–10.5)
nRBC: 0.2 % (ref 0.0–0.2)

## 2022-07-21 LAB — COMPREHENSIVE METABOLIC PANEL
ALT: <5 U/L (ref 0–44)
AST: 9 U/L — ABNORMAL LOW (ref 15–41)
Albumin: 2.9 g/dL — ABNORMAL LOW (ref 3.5–5.0)
Alkaline Phosphatase: 102 U/L (ref 38–126)
Anion gap: 11 (ref 5–15)
BUN: 9 mg/dL (ref 8–23)
CO2: 30 mmol/L (ref 22–32)
Calcium: 8.4 mg/dL — ABNORMAL LOW (ref 8.9–10.3)
Chloride: 97 mmol/L — ABNORMAL LOW (ref 98–111)
Creatinine, Ser: 3.43 mg/dL — ABNORMAL HIGH (ref 0.44–1.00)
GFR, Estimated: 13 mL/min — ABNORMAL LOW (ref >60)
Glucose, Bld: 135 mg/dL — ABNORMAL HIGH (ref 70–99)
Potassium: 3.7 mmol/L (ref 3.5–5.1)
Sodium: 138 mmol/L (ref 135–145)
Total Bilirubin: 0.5 mg/dL (ref 0.3–1.2)
Total Protein: 5.6 g/dL — ABNORMAL LOW (ref 6.5–8.1)

## 2022-07-21 LAB — TSH: TSH: 0.241 u[IU]/mL — ABNORMAL LOW (ref 0.350–4.500)

## 2022-07-21 LAB — POC OCCULT BLOOD, ED: Fecal Occult Bld: NEGATIVE

## 2022-07-21 MED ORDER — IOHEXOL 350 MG/ML SOLN
75.0000 mL | Freq: Once | INTRAVENOUS | Status: AC | PRN
  Administered 2022-07-21: 75 mL via INTRAVENOUS

## 2022-07-21 MED ORDER — PIPERACILLIN-TAZOBACTAM 3.375 G IVPB 30 MIN: 3.3750 g | Freq: Once | INTRAVENOUS | Status: DC

## 2022-07-21 NOTE — ED Provider Triage Note (Signed)
Emergency Medicine Provider Triage Evaluation Note  Jean Mcclain , a 76 y.o. female  was evaluated in triage brought in by EMS.  Pt complains of intermittent blurry vision with overall increased weakness and low blood pressure.  Was noted at dialysis yesterday to have blood pressure systolically around 80, became hypoxic around 80% on room air.  Patient reportedly temporarily lost consciousness, though does not remember this.  Improved with 2 L O2.  Without shortness of breath or chest pain.  Denies recent fevers, though endorses some recent N/V.  Vomiting described as "clear spit".  Attends dialysis MWF  Review of Systems  Positive:  Negative: See above  Physical Exam  BP 107/60 (BP Location: Right Arm)   Pulse 61   Temp 98.2 F (36.8 C) (Oral)   Resp 16   SpO2 97%  Gen:   Awake, no distress   Resp:  Normal effort, equal chest rise MSK:   Moves extremities without difficulty  Other:  Abdomen soft, nontender.  BP appreciated on room of 88/62.  Medical Decision Making  Medically screening exam initiated at 3:32 PM.  Appropriate orders placed.  [name] was informed that the remainder of the evaluation will be completed by another provider, this initial triage assessment does not replace that evaluation, and the importance of remaining in the ED until their evaluation is complete.  Discussed patient with triage nurse, patient to be brought back to next available room.   Prince Rome, PA-C 53/61/44 [id number]

## 2022-07-21 NOTE — ED Provider Notes (Incomplete)
[facility] EMERGENCY DEPARTMENT Provider Note   CSN: [id number] Arrival date & time: 07/21/22  1439     History {Add pertinent medical, surgical, social history, OB history to HPI:1} Chief Complaint  Patient presents with  . Hypotension  . Blurred Vision    Jean Mcclain is a 76 y.o. female.  The history is provided by the patient and medical records. No language interpreter was used.  Loss of Consciousness Episode history:  Single Most recent episode:  Yesterday Timing:  Unable to specify Progression:  Unable to specify Witnessed: yes   Relieved by:  Nothing Worsened by:  Nothing Ineffective treatments:  None tried Associated symptoms: malaise/fatigue, nausea, visual change and vomiting   Associated symptoms: no chest pain, no confusion, no diaphoresis, no difficulty breathing, no fever, no focal weakness, no headaches, no palpitations, no recent fall, no recent injury, no seizures, no shortness of breath and no weakness        Home Medications Prior to Admission medications   Medication Sig Start Date End Date Taking? Authorizing Provider  acetaminophen (TYLENOL) 500 MG tablet Take 2 tablets (1,000 mg total) by mouth 3 (three) times daily. 01/14/22   Dahal, Binaya, MD  albuterol (VENTOLIN HFA) 108 (90 Base) MCG/ACT inhaler Inhale 2 puffs into the lungs every 6 (six) hours as needed for wheezing or shortness of breath.  03/26/20   [provider]  allopurinol (ZYLOPRIM) 100 MG tablet Take 100 mg by mouth daily. 08/24/21   [provider]  apixaban (ELIQUIS) 2.5 MG TABS tablet Take 2.5 mg by mouth 2 (two) times daily.    [provider]  atorvastatin (LIPITOR) 20 MG tablet Take 20 mg by mouth daily. 08/24/21   [provider]  B-D INS SYR ULTRAFINE 1CC/30G 30G X 1/2" 1 ML MISC 1 each by Other route daily.  12/23/19   [provider]  Blood Glucose Monitoring Suppl (ONETOUCH VERIO FLEX SYSTEM) w/Device KIT   03/23/20   [provider]  buprenorphine (BUTRANS) 7.5 MCG/HR Place 1 patch onto the skin once a week.    [provider]  calcium acetate (PHOSLO) 667 MG tablet Take 667 mg by mouth in the morning, at noon, in the evening, and at bedtime.  11/28/19   [provider]  carbidopa-levodopa (SINEMET IR) 25-100 MG tablet Take 2 tablets by mouth daily.    [provider]  docusate sodium (COLACE) 100 MG capsule Take 200 mg by mouth at bedtime.    [provider]  doxercalciferol (HECTOROL) 4 MCG/2ML injection Inject 1 mL (2 mcg total) into the vein every Monday, Wednesday, and Friday with hemodialysis. 01/27/20   [place], Eric J, DO  fluticasone (FLONASE) 50 MCG/ACT nasal spray Place 2 sprays into both nostrils daily.    [provider]  gabapentin (NEURONTIN) 300 MG capsule Take 300 mg by mouth at bedtime.    [provider]  glucose blood test strip 1 each by Other route daily.     [provider]  hydrOXYzine (ATARAX) 50 MG tablet Take 50 mg by mouth 2 (two) times daily. HOLD WHEN OUT OF COMMUNITY FOR DIALYSIS TX ON Monday, Wednesday & FRIDAY 08/24/21   [provider]  insulin aspart (NOVOLOG) 100 UNIT/ML injection Inject 0-15 Units into the skin 3 (three) times daily with meals. Patient taking differently: Inject 0-9 Units into the skin 3 (three) times daily with meals. 01/14/22   Dahal, Binaya, MD  insulin aspart (NOVOLOG) 100 UNIT/ML injection   Inject 0-5 Units into the skin at bedtime. 01/14/22   Dahal, Binaya, MD  insulin NPH Human (NOVOLIN N) 100 UNIT/ML injection Inject 0.05 mLs (5 Units total) into the skin 2 (two) times daily at 8 am and 10 pm. 01/14/22   Dahal, Binaya, MD  Lancets (ONETOUCH DELICA PLUS LANCET33G) MISC 1 each by Other route daily.  03/05/19   [provider]  loratadine (CLARITIN) 10 MG tablet Take 10 mg by mouth daily.    [provider]  midodrine (PROAMATINE) 10 MG tablet Take 10 mg by  mouth See admin instructions. Give 10 mg on Mon, Wed, Fri prior to Dialysis treatment and 1 tablet half way through treatment 08/24/21   [provider]  mirtazapine (REMERON) 15 MG tablet Take 15 mg by mouth at bedtime.    [provider]  Multiple Vitamin (MULTIVITAMIN WITH MINERALS) TABS tablet Take 1 tablet by mouth daily.    [provider]  Nutritional Supplements (ADULT NUTRITIONAL SUPPLEMENT + PO) Take 1 tablet by mouth in the morning and at bedtime. Wound Healing    [provider]  Nutritional Supplements (FEEDING SUPPLEMENT, NEPRO CARB STEADY,) LIQD Take 237 mLs by mouth 2 (two) times daily between meals. 01/16/22   Dahal, Binaya, MD  OXYGEN Inhale 2 L into the lungs as needed (shortness of breath).    [provider]  pantoprazole (PROTONIX) 40 MG tablet Take 40 mg by mouth daily.    [provider]  polyethylene glycol (MIRALAX / GLYCOLAX) 17 g packet Take 17 g by mouth daily as needed for mild constipation.    [provider]  predniSONE (DELTASONE) 2.5 MG tablet Take 2.5 mg by mouth daily. 12/22/21   [provider]  promethazine (PHENERGAN) 12.5 MG tablet Take 12.5 mg by mouth 3 (three) times daily.    [provider]  Ropinirole HCl 12 MG TB24 Take 12 mg by mouth daily.    [provider]  senna-docusate (SENOKOT-S) 8.6-50 MG tablet Take 2 tablets by mouth at bedtime. 01/14/22   Dahal, Binaya, MD  sertraline (ZOLOFT) 25 MG tablet Take 25 mg by mouth daily.    [provider]  simethicone (MYLICON) 125 MG chewable tablet Chew 125 mg by mouth every 12 (twelve) hours as needed for flatulence.    [provider]  tamsulosin (FLOMAX) 0.4 MG CAPS capsule Take 1 capsule (0.4 mg total) by mouth daily. 01/16/22   Dahal, Binaya, MD      Allergies    Morphine    Review of Systems   Review of Systems  Constitutional:  Positive for chills, fatigue and malaise/fatigue. Negative for  diaphoresis and fever.  HENT:  Negative for congestion.   Eyes:  Positive for visual disturbance. Negative for pain.  Respiratory:  Positive for cough (mild). Negative for chest tightness, shortness of breath and wheezing.   Cardiovascular:  Positive for syncope. Negative for chest pain, palpitations and leg swelling.  Gastrointestinal:  Positive for abdominal pain, nausea and vomiting. Negative for constipation and diarrhea.  Genitourinary:  Positive for flank pain. Negative for dysuria.  Musculoskeletal:  Negative for back pain, neck pain and neck stiffness.  Skin:  Negative for rash and wound.  Neurological:  Positive for syncope and light-headedness. Negative for focal weakness, seizures, weakness and headaches.  Psychiatric/Behavioral:  Negative for agitation and confusion.   All other systems reviewed and are negative.   Physical Exam Updated Vital Signs BP 105/65 (BP Location: Left Arm)   Pulse   60   Temp 98.3 F (36.8 C) (Oral)   Resp 17   SpO2 99%  Physical Exam Vitals and nursing note reviewed.  Constitutional:      General: She is not in acute distress.    Appearance: She is well-developed. She is not ill-appearing, toxic-appearing or diaphoretic.  HENT:     Head: Normocephalic and atraumatic.     Nose: No congestion or rhinorrhea.     Mouth/Throat:     Mouth: Mucous membranes are moist.     Pharynx: No oropharyngeal exudate or posterior oropharyngeal erythema.  Eyes:     Extraocular Movements: Extraocular movements intact.     Conjunctiva/sclera: Conjunctivae normal.     Pupils: Pupils are equal, round, and reactive to light.  Cardiovascular:     Rate and Rhythm: Normal rate and regular rhythm.     Heart sounds: No murmur heard. Pulmonary:     Effort: Pulmonary effort is normal. No respiratory distress.     Breath sounds: Normal breath sounds. No wheezing, rhonchi or rales.  Chest:     Chest wall: No tenderness.  Abdominal:     General: Abdomen is flat.      Palpations: Abdomen is soft.     Tenderness: There is no abdominal tenderness. There is no right CVA tenderness, left CVA tenderness, guarding or rebound.  Musculoskeletal:        General: No swelling or tenderness.     Cervical back: Neck supple. No tenderness.     Right lower leg: No edema.     Left lower leg: No edema.  Skin:    General: Skin is warm and dry.     Capillary Refill: Capillary refill takes less than 2 seconds.     Findings: No erythema or rash.  Neurological:     Mental Status: She is alert.     Sensory: No sensory deficit.     Motor: No weakness.     Coordination: Coordination normal.  Psychiatric:        Mood and Affect: Mood normal.     ED Results / Procedures / Treatments   Labs (all labs ordered are listed, but only abnormal results are displayed) Labs Reviewed  COMPREHENSIVE METABOLIC PANEL - Abnormal; Notable for the following components:      Result Value   Chloride 97 (*)    Glucose, Bld 135 (*)    Creatinine, Ser 3.43 (*)    Calcium 8.4 (*)    Total Protein 5.6 (*)    Albumin 2.9 (*)    AST 9 (*)    GFR, Estimated 13 (*)    All other components within normal limits  CBC WITH DIFFERENTIAL/PLATELET - Abnormal; Notable for the following components:   WBC 11.1 (*)    RBC 3.01 (*)    Hemoglobin 7.8 (*)    HCT 26.3 (*)    MCH 25.9 (*)    MCHC 29.7 (*)    RDW 24.0 (*)    Neutro Abs 9.7 (*)    Abs Immature Granulocytes 0.13 (*)    All other components within normal limits  URINALYSIS, ROUTINE W REFLEX MICROSCOPIC    EKG EKG Interpretation  Date/Time:  Thursday July 21 2022 14:59:36 EDT Ventricular Rate:  71 PR Interval:  170 QRS Duration: 90 QT Interval:  454 QTC Calculation: 493 R Axis:   -35 Text Interpretation: Sinus rhythm with Premature supraventricular complexes Left axis deviation Moderate voltage criteria for LVH, may be normal variant ( R in aVL ,  [place] product ) Prolonged QT Abnormal ECG When compared with ECG of  03-Jul-2020 10:42, PREVIOUS ECG IS PRESENT when compared to prior, slightly longer QTc. No STEMI Confirmed by , Chris ([id number]) on 07/21/2022 5:50:07 PM  Radiology No results found.  Procedures Procedures  {Document cardiac monitor, telemetry assessment procedure when appropriate:1}  Medications Ordered in ED Medications - No data to display  ED Course/ Medical Decision Making/ A&P                           Medical Decision Making Amount and/or Complexity of Data Reviewed Labs: ordered. Radiology: ordered.  Risk Prescription drug management.    Jean Mcclain is a 76 y.o. female with a past medical history significant for ESRD on dialysis MWF, previous abdominal hernia status postrepair, previous DVT on Eliquis therapy, GERD, sleep apnea, hypertension, hyperlipidemia, diabetes, verbal report of some parkinsonism, and history of low blood pressures with dialysis presents with a constellation of worsening symptoms with worsening blurry vision with diplopia, hearing loss, intermittent dizziness, nausea, vomiting, abdominal pain, hypoxia intermittently, and a syncopal episode at dialysis yesterday.  According to patient, she does not know what happened yesterday but they told her that she passed out.  Patient said that she was having some lightheadedness and dizziness and EMS found her to have blood pressure of 72/62.  She also had oxygen saturation in the 80s and she was placed on 2 L nasal cannula with improvement.  She was also given 150 mils of fluid and blood pressure improved into the 80s.  She had her full treatment dialysis yesterday by report.  Patient however is less concerned about the lightheadedness and syncope as she says she had lightheadedness in the setting of dialysis in the past with low blood pressures but she is more concerned about her symptoms have been progressing for the last few months and even the last few weeks worsening.  She says she has had some blurry  vision bilaterally and now is having diplopia.  She said that she also having some hearing loss that feels like it is worse in the left ear and is having intermittent room spinning dizziness.  She is never had this before.  She says that she is also had some intermittent nausea and vomiting and has had some pain in her right abdomen and flank that feels new but she cannot say when it started.  She reports she is not having anterior abdominal pain where she has had her ventral hernia surgery before.  She reports no constipation or diarrhea at this time.  She denies any urinary changes does report she makes some urine.  Patient has had CT scans with contrast in the past and agrees to get contrast if she needs it.  Patient previous CT did show some abnormality of the kidney that was concerning for possible neoplasm versus atypical cyst with hemorrhage.  Patient on exam has clear breath sounds.  Chest was nontender.  Abdomen was nontender on my exam but she is hurting in her right flank and right upper abdomen.  She had intact sensation in extremities and has edema both legs.  She reports that she does not ambulate or walk chronically.  Her left leg is in a brace she reports.  She had pupils were metric and reactive with normal interactive movements.  No eye pain reported and no erythema seen.  Back nontender.  Neck nontender.  Clinically concerned about this patient.    I am concerned about 2 separate problems.  I am concerned about the episode of hypotension, hypoxia, and a syncopal episode yesterday but I am also concerned about her progressive neurologic complaints of the blurry vision with diplopia, hearing loss, and dizziness in the setting of possible renal abnormality that could be cancer.  Had a shared decision conversation patient we will do MRI of the brain with and without contrast, CT scan with contrast the abdomen pelvis to rule out obstruction with nausea and vomiting and pain, will get chest x-ray  with the hypoxia and other labs.  She does not have any chest pain or shortness of breath and is not tachycardic or tachypneic.  She is afebrile.  Denies urinary complaints.  Patient had some work-up done in triage initially and she does have a leukocytosis, and she has a hemoglobin of 7.8.  This appears to be downtrending and is new.  She denies any dark tarry stools but reports she does not look at her bowel movements.  Her creatinine is improved from prior as she just had dialysis and her other electrolytes did not show significant potassium abnormality.  Will get TSH, get a type and screen, and will do a fecal occult test.  We will get urinalysis as she says she makes some urine with the abdominal pain and flank pain.  Due to the hypotension, syncope, downtrending hemoglobin, and her neurologic complaints, anticipate admission after work-up was completed.  MRI of the brain was overall reassuring with no evidence of acute stroke, tumor, or other abnormality.  There was some chronic ischemic changes seen which we discussed with the patient.  Still unclear etiology of the patient's diplopia and progressive vision changes over months.  Patient was reassessed and she is still having intermittent pain in her right flank/right abdomen when she moves.  It is not constant.  Her TSH was low, doubt this is the cause of her symptoms although she does have some mild appearance of exophthalmos.  Patient is waiting on the CT abdomen pelvis to rule out diverticulitis or other significant cause of the abdominal pain and the fecal occult test and then will call for admission for hypotension, syncope in the setting of downtrending hemoglobin.  11:55 PM CT scan returned showing acute diverticulitis with an intramural abscess.  We will call general surgery and will order antibiotics.  I suspect this may have contributed to the patient's intermittent hypotension and syncopal episode although does not explain some of her  other complaints.  Those may be related to symptomatic anemia.  Her fecal occult was negative however with the new bowel abnormality she could have intermittent bleeding not seen on the occult test.  Anticipate admission to medicine after speaking with general surgery.  Patient will likely also need dialysis during her admission.  {Document critical care time when appropriate:1} {Document review of labs and clinical decision tools ie heart score, Chads2Vasc2 etc:1}  {Document your independent review of radiology images, and any outside records:1} {Document your discussion with family members, caretakers, and with consultants:1} {Document social determinants of health affecting pt's care:1} {Document your decision making why or why not admission, treatments were needed:1} Final Clinical Impression(s) / ED Diagnoses Final diagnoses:  None    Rx / DC Orders ED Discharge Orders     None

## 2022-07-21 NOTE — ED Triage Notes (Signed)
Pt arrives via EMS for blurred vision for two weeks. Has had fluctuating BP- 72/62 initially. O2 upper 80's room air- improved to 98% on 2L [place]. EMS gave 158m and BP improved to 82 systolic. Pt received dialysis yesterday- full treatment.

## 2022-07-21 NOTE — ED Provider Notes (Signed)
[facility] EMERGENCY DEPARTMENT Provider Note   CSN: [id number] Arrival date & time: 07/21/22  1439     History  Chief Complaint  Patient presents with   Hypotension   Blurred Vision    JAZZMA NEIDHARDT is a 76 y.o. female.  The history is provided by the patient and medical records. No language interpreter was used.  Loss of Consciousness Episode history:  Single Most recent episode:  Yesterday Timing:  Unable to specify Progression:  Unable to specify Witnessed: yes   Relieved by:  Nothing Worsened by:  Nothing Ineffective treatments:  None tried Associated symptoms: malaise/fatigue, nausea, visual change and vomiting   Associated symptoms: no chest pain, no confusion, no diaphoresis, no difficulty breathing, no fever, no focal weakness, no headaches, no palpitations, no recent fall, no recent injury, no seizures, no shortness of breath and no weakness        Home Medications Prior to Admission medications   Medication Sig Start Date End Date Taking? Authorizing Provider  acetaminophen (TYLENOL) 500 MG tablet Take 2 tablets (1,000 mg total) by mouth 3 (three) times daily. 01/14/22   Dahal, Marlowe Aschoff, MD  albuterol (VENTOLIN HFA) 108 (90 Base) MCG/ACT inhaler Inhale 2 puffs into the lungs every 6 (six) hours as needed for wheezing or shortness of breath.  03/26/20   [provider]  allopurinol (ZYLOPRIM) 100 MG tablet Take 100 mg by mouth daily. 08/24/21   [provider]  apixaban (ELIQUIS) 2.5 MG TABS tablet Take 2.5 mg by mouth 2 (two) times daily.    [provider]  atorvastatin (LIPITOR) 20 MG tablet Take 20 mg by mouth daily. 08/24/21   [provider]  B-D INS SYR ULTRAFINE 1CC/30G 30G X 1/2" 1 ML MISC 1 each by Other route daily.  12/23/19   [provider]  Blood Glucose Monitoring Suppl (March ARB) w/Device KIT  03/23/20   [provider]  buprenorphine (BUTRANS) 7.5 MCG/HR  Place 1 patch onto the skin once a week.    [provider]  calcium acetate (PHOSLO) 667 MG tablet Take 667 mg by mouth in the morning, at noon, in the evening, and at bedtime.  11/28/19   [provider]  carbidopa-levodopa (SINEMET IR) 25-100 MG tablet Take 2 tablets by mouth daily.    [provider]  docusate sodium (COLACE) 100 MG capsule Take 200 mg by mouth at bedtime.    [provider]  doxercalciferol (HECTOROL) 4 MCG/2ML injection Inject 1 mL (2 mcg total) into the vein every Monday, Wednesday, and Friday with hemodialysis. 01/27/20   [place] Eric J, DO  fluticasone (FLONASE) 50 MCG/ACT nasal spray Place 2 sprays into both nostrils daily.    [provider]  gabapentin (NEURONTIN) 300 MG capsule Take 300 mg by mouth at bedtime.    [provider]  glucose blood test strip 1 each by Other route daily.     [provider]  hydrOXYzine (ATARAX) 50 MG tablet Take 50 mg by mouth 2 (two) times daily. HOLD WHEN OUT OF COMMUNITY FOR DIALYSIS TX ON Monday, Wednesday & FRIDAY 08/24/21   [provider]  insulin aspart (NOVOLOG) 100 UNIT/ML injection Inject 0-15 Units into the skin 3 (three) times daily with meals. Patient taking differently: Inject 0-9 Units into the skin 3 (three) times daily with meals. 01/14/22   Dahal, Marlowe Aschoff, MD  insulin aspart (NOVOLOG) 100 UNIT/ML injection Inject 0-5 Units into the skin at bedtime. 01/14/22  Terrilee Croak, MD  insulin NPH Human (NOVOLIN N) 100 UNIT/ML injection Inject 0.05 mLs (5 Units total) into the skin 2 (two) times daily at 8 am and 10 pm. 01/14/22   Dahal, Marlowe Aschoff, MD  Lancets [facility] DELICA PLUS [id number]) MISC 1 each by Other route daily.  03/05/19   [provider]  loratadine (CLARITIN) 10 MG tablet Take 10 mg by mouth daily.    [provider]  midodrine (PROAMATINE) 10 MG tablet Take 10 mg by mouth See admin instructions. Give 10 mg on Mon, Wed, Fri prior to  Dialysis treatment and 1 tablet half way through treatment 08/24/21   [provider]  mirtazapine (REMERON) 15 MG tablet Take 15 mg by mouth at bedtime.    [provider]  Multiple Vitamin (MULTIVITAMIN WITH MINERALS) TABS tablet Take 1 tablet by mouth daily.    [provider]  Nutritional Supplements (ADULT NUTRITIONAL SUPPLEMENT + PO) Take 1 tablet by mouth in the morning and at bedtime. Wound Healing    [provider]  Nutritional Supplements (FEEDING SUPPLEMENT, NEPRO CARB STEADY,) LIQD Take 237 mLs by mouth 2 (two) times daily between meals. 01/16/22   Terrilee Croak, MD  OXYGEN Inhale 2 L into the lungs as needed (shortness of breath).    [provider]  pantoprazole (PROTONIX) 40 MG tablet Take 40 mg by mouth daily.    [provider]  polyethylene glycol (MIRALAX / GLYCOLAX) 17 g packet Take 17 g by mouth daily as needed for mild constipation.    [provider]  predniSONE (DELTASONE) 2.5 MG tablet Take 2.5 mg by mouth daily. 12/22/21   [provider]  promethazine (PHENERGAN) 12.5 MG tablet Take 12.5 mg by mouth 3 (three) times daily.    [provider]  Ropinirole HCl 12 MG TB24 Take 12 mg by mouth daily.    [provider]  senna-docusate (SENOKOT-S) 8.6-50 MG tablet Take 2 tablets by mouth at bedtime. 01/14/22   Terrilee Croak, MD  sertraline (ZOLOFT) 25 MG tablet Take 25 mg by mouth daily.    [provider]  simethicone (MYLICON) 384 MG chewable tablet Chew 125 mg by mouth every 12 (twelve) hours as needed for flatulence.    [provider]  tamsulosin (FLOMAX) 0.4 MG CAPS capsule Take 1 capsule (0.4 mg total) by mouth daily. 01/16/22   Terrilee Croak, MD      Allergies    Morphine    Review of Systems   Review of Systems  Constitutional:  Positive for chills, fatigue and malaise/fatigue. Negative for diaphoresis and fever.  HENT:  Negative for congestion.   Eyes:  Positive  for visual disturbance. Negative for pain.  Respiratory:  Positive for cough (mild). Negative for chest tightness, shortness of breath and wheezing.   Cardiovascular:  Positive for syncope. Negative for chest pain, palpitations and leg swelling.  Gastrointestinal:  Positive for abdominal pain, nausea and vomiting. Negative for constipation and diarrhea.  Genitourinary:  Positive for flank pain. Negative for dysuria.  Musculoskeletal:  Negative for back pain, neck pain and neck stiffness.  Skin:  Negative for rash and wound.  Neurological:  Positive for syncope and light-headedness. Negative for focal weakness, seizures, weakness and headaches.  Psychiatric/Behavioral:  Negative for agitation and confusion.   All other systems reviewed and are negative.   Physical Exam Updated Vital Signs BP 105/65 (BP Location: Left Arm)   Pulse 60   Temp 98.3 F (36.8 C) (Oral)  Resp 17   SpO2 99%  Physical Exam Vitals and nursing note reviewed.  Constitutional:      General: She is not in acute distress.    Appearance: She is well-developed. She is not ill-appearing, toxic-appearing or diaphoretic.  HENT:     Head: Normocephalic and atraumatic.     Nose: No congestion or rhinorrhea.     Mouth/Throat:     Mouth: Mucous membranes are moist.     Pharynx: No oropharyngeal exudate or posterior oropharyngeal erythema.  Eyes:     Extraocular Movements: Extraocular movements intact.     Conjunctiva/sclera: Conjunctivae normal.     Pupils: Pupils are equal, round, and reactive to light.  Cardiovascular:     Rate and Rhythm: Normal rate and regular rhythm.     Heart sounds: No murmur heard. Pulmonary:     Effort: Pulmonary effort is normal. No respiratory distress.     Breath sounds: Normal breath sounds. No wheezing, rhonchi or rales.  Chest:     Chest wall: No tenderness.  Abdominal:     General: Abdomen is flat.     Palpations: Abdomen is soft.     Tenderness: There is no abdominal  tenderness. There is no right CVA tenderness, left CVA tenderness, guarding or rebound.  Musculoskeletal:        General: No swelling or tenderness.     Cervical back: Neck supple. No tenderness.     Right lower leg: No edema.     Left lower leg: No edema.  Skin:    General: Skin is warm and dry.     Capillary Refill: Capillary refill takes less than 2 seconds.     Findings: No erythema or rash.  Neurological:     Mental Status: She is alert.     Sensory: No sensory deficit.     Motor: No weakness.     Coordination: Coordination normal.  Psychiatric:        Mood and Affect: Mood normal.     ED Results / Procedures / Treatments   Labs (all labs ordered are listed, but only abnormal results are displayed) Labs Reviewed  COMPREHENSIVE METABOLIC PANEL - Abnormal; Notable for the following components:      Result Value   Chloride 97 (*)    Glucose, Bld 135 (*)    Creatinine, Ser 3.43 (*)    Calcium 8.4 (*)    Total Protein 5.6 (*)    Albumin 2.9 (*)    AST 9 (*)    GFR, Estimated 13 (*)    All other components within normal limits  CBC WITH DIFFERENTIAL/PLATELET - Abnormal; Notable for the following components:   WBC 11.1 (*)    RBC 3.01 (*)    Hemoglobin 7.8 (*)    HCT 26.3 (*)    MCH 25.9 (*)    MCHC 29.7 (*)    RDW 24.0 (*)    Neutro Abs 9.7 (*)    Abs Immature Granulocytes 0.13 (*)    All other components within normal limits  TSH - Abnormal; Notable for the following components:   TSH 0.241 (*)    All other components within normal limits  URINALYSIS, ROUTINE W REFLEX MICROSCOPIC  POC OCCULT BLOOD, ED  TYPE AND SCREEN    EKG EKG Interpretation  Date/Time:  Thursday July 21 2022 14:59:36 EDT Ventricular Rate:  71 PR Interval:  170 QRS Duration: 90 QT Interval:  454 QTC Calculation: 493 R Axis:   -35 Text Interpretation: Sinus  rhythm with Premature supraventricular complexes Left axis deviation Moderate voltage criteria for LVH, may be normal variant (  R in aVL , Cornell product ) Prolonged QT Abnormal ECG When compared with ECG of 03-Jul-2020 10:42, PREVIOUS ECG IS PRESENT when compared to prior, slightly longer QTc. No STEMI Confirmed by Antony Blackbird [phone]) on 07/21/2022 5:50:07 PM  Radiology CT ABDOMEN PELVIS W CONTRAST  Result Date: 07/21/2022 CLINICAL DATA:  Abdominal pain, nausea/vomiting EXAM: CT ABDOMEN AND PELVIS WITH CONTRAST TECHNIQUE: Multidetector CT imaging of the abdomen and pelvis was performed using the standard protocol following bolus administration of intravenous contrast. RADIATION DOSE REDUCTION: This exam was performed according to the departmental dose-optimization program which includes automated exposure control, adjustment of the mA and/or kV according to patient size and/or use of iterative reconstruction technique. CONTRAST:  62m OMNIPAQUE IOHEXOL 350 MG/ML SOLN COMPARISON:  01/09/2022 and 07/28/2020 FINDINGS: Lower chest: Lung bases are clear. Hepatobiliary: Liver is within normal limits. Gallbladder is unremarkable. No intrahepatic or extrahepatic duct dilatation. Pancreas: Within normal limits. Spleen: Within normal limits. Adrenals/Urinary Tract: Right adrenal gland is within normal limits. At least 2 left adrenal nodules measuring up to 10 mm (series 3/image 22), unchanged from 2021, favoring benign adrenal adenomas. Left renal cortical atrophy. 10 mm right lower pole simple cyst (series 3/image 38), benign (Bosniak I). However, there is a 16 mm hyperdense lesion in the anterior right upper kidney (series 3/image 32), similar to the most recent prior but previously measuring 9 mm in 2021. Differential considerations include benign hemorrhagic cyst versus small papillary renal cell carcinoma. No hydronephrosis. Mildly thick-walled bladder, although underdistended. Stomach/Bowel: Stomach is within normal limits. No evidence of bowel obstruction. Normal appendix (series 3/image 60). Sigmoid diverticulosis with mild  pericolonic inflammatory changes in the left lower quadrant, suggesting sigmoid diverticulitis, and a suspected 1.5 x 2.5 cm intramural abscess (series 3/image 61). No free air. Vascular/Lymphatic: No evidence of abdominal aortic aneurysm. Atherosclerotic calcifications of the abdominal aorta and branch vessels. No suspicious abdominopelvic lymphadenopathy. Reproductive: Uterus is within normal limits. Bilateral ovaries are grossly unremarkable. Other: No abdominopelvic ascites. Musculoskeletal: Degenerative changes of the visualized thoracolumbar spine. IMPRESSION: Acute sigmoid diverticulitis with suspected 2.5 cm intramural abscess. No free air. 16 mm hyperdense lesion in the anterior right upper kidney, mildly increased from 2021. Differential considerations include benign hemorrhagic cyst versus small papillary renal cell carcinoma. Consider follow-up MRI abdomen with/without contrast in 6 months. Additional ancillary findings as above. Electronically Signed   By: SJulian HyM.D.   On: 07/21/2022 23:43   MR BRAIN WO CONTRAST  Result Date: 07/21/2022 CLINICAL DATA:  Diplopia EXAM: MRI HEAD WITHOUT CONTRAST TECHNIQUE: Multiplanar, multiecho pulse sequences of the brain and surrounding structures were obtained without intravenous contrast. COMPARISON:  None Available. FINDINGS: Brain: No acute infarct, mass effect or extra-axial collection. No acute or chronic hemorrhage. There is multifocal hyperintense T2-weighted signal within the white matter. Parenchymal volume and CSF spaces are normal. The midline structures are normal. Vascular: Major flow voids are preserved. Skull and upper cervical spine: Normal calvarium and skull base. Visualized upper cervical spine and soft tissues are normal. Sinuses/Orbits:No paranasal sinus fluid levels or advanced mucosal thickening. No mastoid or middle ear effusion. Normal orbits. IMPRESSION: 1. No acute intracranial abnormality. 2. Findings of chronic small vessel  ischemia. Electronically Signed   By: KUlyses JarredM.D.   On: 07/21/2022 22:13   DG Chest Portable 1 View  Result Date: 07/21/2022 CLINICAL DATA:  Hypoxia, hypertension EXAM: PORTABLE CHEST 1  VIEW COMPARISON:  Chest 01/09/2022 FINDINGS: Heart size upper normal. Vascularity normal. Lungs clear without infiltrate or effusion. Bilateral shoulder subluxation appears chronic and unchanged. IMPRESSION: No active disease. Electronically Signed   By: Franchot Gallo M.D.   On: 07/21/2022 19:07    Procedures Procedures    Medications Ordered in ED Medications  cefTRIAXone (ROCEPHIN) 2 g in sodium chloride 0.9 % 100 mL IVPB (has no administration in time range)  metroNIDAZOLE (FLAGYL) IVPB 500 mg (has no administration in time range)  iohexol (OMNIPAQUE) 350 MG/ML injection 75 mL (75 mLs Intravenous Contrast Given 07/21/22 2317)    ED Course/ Medical Decision Making/ A&P                           Medical Decision Making Amount and/or Complexity of Data Reviewed Labs: ordered. Radiology: ordered.  Risk Prescription drug management.    OLIANA GOWENS is a 76 y.o. female with a past medical history significant for ESRD on dialysis MWF, previous abdominal hernia status postrepair, previous DVT on Eliquis therapy, GERD, sleep apnea, hypertension, hyperlipidemia, diabetes, verbal report of some parkinsonism, and history of low blood pressures with dialysis presents with a constellation of worsening symptoms with worsening blurry vision with diplopia, hearing loss, intermittent dizziness, nausea, vomiting, abdominal pain, hypoxia intermittently, and a syncopal episode at dialysis yesterday.  According to patient, she does not know what happened yesterday but they told her that she passed out.  Patient said that she was having some lightheadedness and dizziness and EMS found her to have blood pressure of 72/62.  She also had oxygen saturation in the 80s and she was placed on 2 L nasal cannula with  improvement.  She was also given 150 mils of fluid and blood pressure improved into the 80s.  She had her full treatment dialysis yesterday by report.  Patient however is less concerned about the lightheadedness and syncope as she says she had lightheadedness in the setting of dialysis in the past with low blood pressures but she is more concerned about her symptoms have been progressing for the last few months and even the last few weeks worsening.  She says she has had some blurry vision bilaterally and now is having diplopia.  She said that she also having some hearing loss that feels like it is worse in the left ear and is having intermittent room spinning dizziness.  She is never had this before.  She says that she is also had some intermittent nausea and vomiting and has had some pain in her right abdomen and flank that feels new but she cannot say when it started.  She reports she is not having anterior abdominal pain where she has had her ventral hernia surgery before.  She reports no constipation or diarrhea at this time.  She denies any urinary changes does report she makes some urine.  Patient has had CT scans with contrast in the past and agrees to get contrast if she needs it.  Patient previous CT did show some abnormality of the kidney that was concerning for possible neoplasm versus atypical cyst with hemorrhage.  Patient on exam has clear breath sounds.  Chest was nontender.  Abdomen was nontender on my exam but she is hurting in her right flank and right upper abdomen.  She had intact sensation in extremities and has edema both legs.  She reports that she does not ambulate or walk chronically.  Her left leg is  in a brace she reports.  She had pupils were metric and reactive with normal interactive movements.  No eye pain reported and no erythema seen.  Back nontender.  Neck nontender.  Clinically concerned about this patient.  I am concerned about 2 separate problems.  I am concerned about the  episode of hypotension, hypoxia, and a syncopal episode yesterday but I am also concerned about her progressive neurologic complaints of the blurry vision with diplopia, hearing loss, and dizziness in the setting of possible renal abnormality that could be cancer.  Had a shared decision conversation patient we will do MRI of the brain with and without contrast, CT scan with contrast the abdomen pelvis to rule out obstruction with nausea and vomiting and pain, will get chest x-ray with the hypoxia and other labs.  She does not have any chest pain or shortness of breath and is not tachycardic or tachypneic.  She is afebrile.  Denies urinary complaints.  Patient had some work-up done in triage initially and she does have a leukocytosis, and she has a hemoglobin of 7.8.  This appears to be downtrending and is new.  She denies any dark tarry stools but reports she does not look at her bowel movements.  Her creatinine is improved from prior as she just had dialysis and her other electrolytes did not show significant potassium abnormality.  Will get TSH, get a type and screen, and will do a fecal occult test.  We will get urinalysis as she says she makes some urine with the abdominal pain and flank pain.  Due to the hypotension, syncope, downtrending hemoglobin, and her neurologic complaints, anticipate admission after work-up was completed.  MRI of the brain was overall reassuring with no evidence of acute stroke, tumor, or other abnormality.  There was some chronic ischemic changes seen which we discussed with the patient.  Still unclear etiology of the patient's diplopia and progressive vision changes over months.  Patient was reassessed and she is still having intermittent pain in her right flank/right abdomen when she moves.  It is not constant.  Her TSH was low, doubt this is the cause of her symptoms although she does have some mild appearance of exophthalmos.  Patient is waiting on the CT abdomen pelvis  to rule out diverticulitis or other significant cause of the abdominal pain and the fecal occult test and then will call for admission for hypotension, syncope in the setting of downtrending hemoglobin.  11:55 PM CT scan returned showing acute diverticulitis with an intramural abscess.  We will call general surgery and will order antibiotics.  I suspect this may have contributed to the patient's intermittent hypotension and syncopal episode although does not explain some of her other complaints.  Those may be related to symptomatic anemia.  Her fecal occult was negative however with the new bowel abnormality she could have intermittent bleeding not seen on the occult test.  Anticipate admission to medicine after speaking with general surgery.  Patient will likely also need dialysis during her admission.  General surgery with no IR management at this time and they will see the patient in consultation.  Medicine admit for further management.        Final Clinical Impression(s) / ED Diagnoses Final diagnoses:  Syncope, unspecified syncope type  Acute diverticulitis     Clinical Impression: 1. Syncope, unspecified syncope type   2. Acute diverticulitis     Disposition: Admit  This note was prepared with assistance of Dragon voice recognition software.  Occasional wrong-word or sound-a-like substitutions may have occurred due to the inherent limitations of voice recognition software.      Ethlyn Alto, Gwenyth Allegra, MD 07/22/22 [phone]

## 2022-07-22 ENCOUNTER — Encounter (HOSPITAL_COMMUNITY): Payer: Self-pay | Admitting: Internal Medicine

## 2022-07-22 ENCOUNTER — Observation Stay (HOSPITAL_COMMUNITY): Payer: HMO

## 2022-07-22 ENCOUNTER — Inpatient Hospital Stay (HOSPITAL_COMMUNITY): Payer: HMO

## 2022-07-22 DIAGNOSIS — H532 Diplopia: Secondary | ICD-10-CM | POA: Diagnosis present

## 2022-07-22 DIAGNOSIS — N186 End stage renal disease: Secondary | ICD-10-CM | POA: Diagnosis not present

## 2022-07-22 DIAGNOSIS — R55 Syncope and collapse: Secondary | ICD-10-CM | POA: Diagnosis not present

## 2022-07-22 DIAGNOSIS — Z992 Dependence on renal dialysis: Secondary | ICD-10-CM | POA: Diagnosis not present

## 2022-07-22 DIAGNOSIS — G20A1 Parkinson's disease without dyskinesia, without mention of fluctuations: Secondary | ICD-10-CM | POA: Diagnosis present

## 2022-07-22 DIAGNOSIS — I953 Hypotension of hemodialysis: Secondary | ICD-10-CM | POA: Diagnosis present

## 2022-07-22 DIAGNOSIS — Z87891 Personal history of nicotine dependence: Secondary | ICD-10-CM | POA: Diagnosis not present

## 2022-07-22 DIAGNOSIS — M7989 Other specified soft tissue disorders: Secondary | ICD-10-CM | POA: Diagnosis not present

## 2022-07-22 DIAGNOSIS — K5792 Diverticulitis of intestine, part unspecified, without perforation or abscess without bleeding: Secondary | ICD-10-CM | POA: Diagnosis not present

## 2022-07-22 DIAGNOSIS — K5780 Diverticulitis of intestine, part unspecified, with perforation and abscess without bleeding: Secondary | ICD-10-CM | POA: Diagnosis present

## 2022-07-22 DIAGNOSIS — K5720 Diverticulitis of large intestine with perforation and abscess without bleeding: Secondary | ICD-10-CM

## 2022-07-22 DIAGNOSIS — D631 Anemia in chronic kidney disease: Secondary | ICD-10-CM

## 2022-07-22 DIAGNOSIS — H538 Other visual disturbances: Secondary | ICD-10-CM | POA: Diagnosis not present

## 2022-07-22 DIAGNOSIS — Z96652 Presence of left artificial knee joint: Secondary | ICD-10-CM | POA: Diagnosis present

## 2022-07-22 DIAGNOSIS — Z66 Do not resuscitate: Secondary | ICD-10-CM | POA: Diagnosis present

## 2022-07-22 DIAGNOSIS — E0781 Sick-euthyroid syndrome: Secondary | ICD-10-CM | POA: Diagnosis present

## 2022-07-22 DIAGNOSIS — Z794 Long term (current) use of insulin: Secondary | ICD-10-CM | POA: Diagnosis not present

## 2022-07-22 DIAGNOSIS — N289 Disorder of kidney and ureter, unspecified: Secondary | ICD-10-CM

## 2022-07-22 DIAGNOSIS — E785 Hyperlipidemia, unspecified: Secondary | ICD-10-CM | POA: Diagnosis present

## 2022-07-22 DIAGNOSIS — E119 Type 2 diabetes mellitus without complications: Secondary | ICD-10-CM

## 2022-07-22 DIAGNOSIS — Z79899 Other long term (current) drug therapy: Secondary | ICD-10-CM | POA: Diagnosis not present

## 2022-07-22 DIAGNOSIS — Z9981 Dependence on supplemental oxygen: Secondary | ICD-10-CM | POA: Diagnosis not present

## 2022-07-22 DIAGNOSIS — Z981 Arthrodesis status: Secondary | ICD-10-CM | POA: Diagnosis not present

## 2022-07-22 DIAGNOSIS — E669 Obesity, unspecified: Secondary | ICD-10-CM | POA: Diagnosis present

## 2022-07-22 DIAGNOSIS — E1122 Type 2 diabetes mellitus with diabetic chronic kidney disease: Secondary | ICD-10-CM | POA: Diagnosis present

## 2022-07-22 DIAGNOSIS — I120 Hypertensive chronic kidney disease with stage 5 chronic kidney disease or end stage renal disease: Secondary | ICD-10-CM | POA: Diagnosis present

## 2022-07-22 DIAGNOSIS — J9610 Chronic respiratory failure, unspecified whether with hypoxia or hypercapnia: Secondary | ICD-10-CM | POA: Diagnosis present

## 2022-07-22 DIAGNOSIS — Z86718 Personal history of other venous thrombosis and embolism: Secondary | ICD-10-CM | POA: Diagnosis not present

## 2022-07-22 DIAGNOSIS — R112 Nausea with vomiting, unspecified: Secondary | ICD-10-CM | POA: Diagnosis not present

## 2022-07-22 DIAGNOSIS — Z6834 Body mass index (BMI) 34.0-34.9, adult: Secondary | ICD-10-CM | POA: Diagnosis not present

## 2022-07-22 DIAGNOSIS — R6339 Other feeding difficulties: Secondary | ICD-10-CM | POA: Diagnosis not present

## 2022-07-22 DIAGNOSIS — K5900 Constipation, unspecified: Secondary | ICD-10-CM | POA: Diagnosis present

## 2022-07-22 DIAGNOSIS — M47812 Spondylosis without myelopathy or radiculopathy, cervical region: Secondary | ICD-10-CM | POA: Diagnosis present

## 2022-07-22 LAB — ECHOCARDIOGRAM COMPLETE
AR max vel: 1.85 cm2
AV Area VTI: 2.14 cm2
AV Area mean vel: 1.99 cm2
AV Mean grad: 11 mmHg
AV Peak grad: 18.5 mmHg
Ao pk vel: 2.15 m/s
Area-P 1/2: 2.73 cm2
MV M vel: 3.99 m/s
MV Peak grad: 63.5 mmHg
MV VTI: 2.06 cm2
S' Lateral: 2.6 cm

## 2022-07-22 LAB — BASIC METABOLIC PANEL
Anion gap: 7 (ref 5–15)
BUN: 11 mg/dL (ref 8–23)
CO2: 30 mmol/L (ref 22–32)
Calcium: 8 mg/dL — ABNORMAL LOW (ref 8.9–10.3)
Chloride: 101 mmol/L (ref 98–111)
Creatinine, Ser: 3.71 mg/dL — ABNORMAL HIGH (ref 0.44–1.00)
GFR, Estimated: 12 mL/min — ABNORMAL LOW (ref >60)
Glucose, Bld: 105 mg/dL — ABNORMAL HIGH (ref 70–99)
Potassium: 3.7 mmol/L (ref 3.5–5.1)
Sodium: 138 mmol/L (ref 135–145)

## 2022-07-22 LAB — CBC
HCT: 24 % — ABNORMAL LOW (ref 36.0–46.0)
Hemoglobin: 7.1 g/dL — ABNORMAL LOW (ref 12.0–15.0)
MCH: 25.7 pg — ABNORMAL LOW (ref 26.0–34.0)
MCHC: 29.6 g/dL — ABNORMAL LOW (ref 30.0–36.0)
MCV: 87 fL (ref 80.0–100.0)
Platelets: 231 10*3/uL (ref 150–400)
RBC: 2.76 MIL/uL — ABNORMAL LOW (ref 3.87–5.11)
RDW: 24 % — ABNORMAL HIGH (ref 11.5–15.5)
WBC: 9.2 10*3/uL (ref 4.0–10.5)
nRBC: 0 % (ref 0.0–0.2)

## 2022-07-22 LAB — LACTATE DEHYDROGENASE: LDH: 136 U/L (ref 98–192)

## 2022-07-22 LAB — GLUCOSE, CAPILLARY: Glucose-Capillary: 113 mg/dL — ABNORMAL HIGH (ref 70–99)

## 2022-07-22 LAB — HEPATITIS B SURFACE ANTIGEN: Hepatitis B Surface Ag: NONREACTIVE

## 2022-07-22 LAB — CORTISOL: Cortisol, Plasma: 12.3 ug/dL

## 2022-07-22 LAB — PHOSPHORUS: Phosphorus: 2.4 mg/dL — ABNORMAL LOW (ref 2.5–4.6)

## 2022-07-22 LAB — CBG MONITORING, ED
Glucose-Capillary: 104 mg/dL — ABNORMAL HIGH (ref 70–99)
Glucose-Capillary: 89 mg/dL (ref 70–99)
Glucose-Capillary: 96 mg/dL (ref 70–99)
Glucose-Capillary: 97 mg/dL (ref 70–99)

## 2022-07-22 LAB — VITAMIN B12: Vitamin B-12: 264 pg/mL (ref 180–914)

## 2022-07-22 LAB — IRON AND TIBC
Iron: 50 ug/dL (ref 28–170)
Saturation Ratios: 38 % — ABNORMAL HIGH (ref 10.4–31.8)
TIBC: 130 ug/dL — ABNORMAL LOW (ref 250–450)
UIBC: 80 ug/dL

## 2022-07-22 LAB — HEPATITIS C ANTIBODY: HCV Ab: NONREACTIVE

## 2022-07-22 LAB — HEPATITIS B SURFACE ANTIBODY,QUALITATIVE: Hep B S Ab: NONREACTIVE

## 2022-07-22 LAB — HEMOGLOBIN A1C
Hgb A1c MFr Bld: 6.9 % — ABNORMAL HIGH (ref 4.8–5.6)
Mean Plasma Glucose: 151.33 mg/dL

## 2022-07-22 LAB — HEPATITIS B CORE ANTIBODY, TOTAL: Hep B Core Total Ab: NONREACTIVE

## 2022-07-22 LAB — FERRITIN: Ferritin: 883 ng/mL — ABNORMAL HIGH (ref 11–307)

## 2022-07-22 LAB — TSH: TSH: 0.212 u[IU]/mL — ABNORMAL LOW (ref 0.350–4.500)

## 2022-07-22 LAB — FOLATE: Folate: 3.4 ng/mL — ABNORMAL LOW (ref >5.9)

## 2022-07-22 MED ORDER — GABAPENTIN 300 MG PO CAPS
300.0000 mg | ORAL_CAPSULE | Freq: Every day | ORAL | Status: DC
  Administered 2022-07-22 – 2022-07-27 (×5): 300 mg via ORAL
  Filled 2022-07-22 (×5): qty 1

## 2022-07-22 MED ORDER — DIPHENHYDRAMINE HCL 50 MG/ML IJ SOLN: 12.5000 mg | Freq: Three times a day (TID) | INTRAMUSCULAR | Status: DC | PRN

## 2022-07-22 MED ORDER — MIDODRINE HCL 5 MG PO TABS
20.0000 mg | ORAL_TABLET | ORAL | Status: DC
  Administered 2022-07-22 – 2022-07-27 (×3): 20 mg via ORAL
  Filled 2022-07-22 (×2): qty 4

## 2022-07-22 MED ORDER — DOCUSATE SODIUM 100 MG PO CAPS
200.0000 mg | ORAL_CAPSULE | Freq: Every day | ORAL | Status: DC
  Administered 2022-07-22 – 2022-07-23 (×2): 200 mg via ORAL
  Filled 2022-07-22 (×2): qty 2

## 2022-07-22 MED ORDER — APIXABAN 2.5 MG PO TABS
2.5000 mg | ORAL_TABLET | Freq: Two times a day (BID) | ORAL | Status: DC
  Administered 2022-07-22: 2.5 mg via ORAL
  Filled 2022-07-22: qty 1

## 2022-07-22 MED ORDER — CAMPHOR-MENTHOL 0.5-0.5 % EX LOTN: 1.0000 | TOPICAL_LOTION | Freq: Every day | CUTANEOUS | Status: DC | PRN

## 2022-07-22 MED ORDER — ACETAMINOPHEN 650 MG RE SUPP: 650.0000 mg | Freq: Four times a day (QID) | RECTAL | Status: DC | PRN

## 2022-07-22 MED ORDER — MIRTAZAPINE 15 MG PO TABS
15.0000 mg | ORAL_TABLET | Freq: Every day | ORAL | Status: DC
  Administered 2022-07-22 – 2022-07-27 (×5): 15 mg via ORAL
  Filled 2022-07-22 (×5): qty 1

## 2022-07-22 MED ORDER — ROPINIROLE HCL ER 4 MG PO TB24
12.0000 mg | ORAL_TABLET | Freq: Every day | ORAL | Status: DC
  Administered 2022-07-23 – 2022-07-28 (×6): 12 mg via ORAL
  Filled 2022-07-22 (×7): qty 3

## 2022-07-22 MED ORDER — ATORVASTATIN CALCIUM 10 MG PO TABS
20.0000 mg | ORAL_TABLET | Freq: Every day | ORAL | Status: DC
  Administered 2022-07-23 – 2022-07-28 (×6): 20 mg via ORAL
  Filled 2022-07-22 (×6): qty 2

## 2022-07-22 MED ORDER — PANTOPRAZOLE SODIUM 40 MG PO TBEC: 40.0000 mg | DELAYED_RELEASE_TABLET | Freq: Every day | ORAL | Status: DC

## 2022-07-22 MED ORDER — BUPRENORPHINE 7.5 MCG/HR TD PTWK
1.0000 | MEDICATED_PATCH | TRANSDERMAL | Status: DC
  Filled 2022-07-22 (×2): qty 1

## 2022-07-22 MED ORDER — CHLORHEXIDINE GLUCONATE CLOTH 2 % EX PADS
6.0000 | MEDICATED_PAD | Freq: Every day | CUTANEOUS | Status: DC
  Administered 2022-07-23 – 2022-07-28 (×6): 6 via TOPICAL

## 2022-07-22 MED ORDER — DOXERCALCIFEROL 4 MCG/2ML IV SOLN
2.0000 ug | INTRAVENOUS | Status: DC
  Administered 2022-07-22 – 2022-07-27 (×2): 2 ug via INTRAVENOUS
  Filled 2022-07-22 (×4): qty 2

## 2022-07-22 MED ORDER — MIDODRINE HCL 5 MG PO TABS: 10.0000 mg | ORAL_TABLET | Freq: Three times a day (TID) | ORAL | Status: DC

## 2022-07-22 MED ORDER — CARBIDOPA-LEVODOPA 25-100 MG PO TABS
2.0000 | ORAL_TABLET | Freq: Every morning | ORAL | Status: DC
  Administered 2022-07-23 – 2022-07-25 (×3): 2 via ORAL
  Filled 2022-07-22 (×5): qty 2

## 2022-07-22 MED ORDER — TAMSULOSIN HCL 0.4 MG PO CAPS
0.4000 mg | ORAL_CAPSULE | Freq: Every day | ORAL | Status: DC
  Administered 2022-07-22 – 2022-07-26 (×5): 0.4 mg via ORAL
  Filled 2022-07-22 (×5): qty 1

## 2022-07-22 MED ORDER — SODIUM CHLORIDE 0.9% IV SOLUTION: Freq: Once | INTRAVENOUS | Status: DC

## 2022-07-22 MED ORDER — CARBIDOPA-LEVODOPA 25-100 MG PO TABS: 1.0000 | ORAL_TABLET | ORAL | Status: DC

## 2022-07-22 MED ORDER — NYSTATIN 100000 UNIT/GM EX POWD
1.0000 | Freq: Every day | CUTANEOUS | Status: DC
  Administered 2022-07-22 – 2022-07-28 (×7): 1 via TOPICAL
  Filled 2022-07-22: qty 15

## 2022-07-22 MED ORDER — ACETAMINOPHEN 325 MG PO TABS
650.0000 mg | ORAL_TABLET | Freq: Four times a day (QID) | ORAL | Status: DC | PRN
  Administered 2022-07-23: 650 mg via ORAL
  Filled 2022-07-22: qty 2

## 2022-07-22 MED ORDER — SIMETHICONE 80 MG PO CHEW
125.0000 mg | CHEWABLE_TABLET | Freq: Two times a day (BID) | ORAL | Status: DC | PRN
  Administered 2022-07-24 – 2022-07-26 (×2): 120 mg via ORAL
  Filled 2022-07-22 (×2): qty 2

## 2022-07-22 MED ORDER — INSULIN ASPART 100 UNIT/ML IJ SOLN
0.0000 [IU] | INTRAMUSCULAR | Status: DC
  Administered 2022-07-23 – 2022-07-26 (×2): 2 [IU] via SUBCUTANEOUS
  Administered 2022-07-27: 1 [IU] via SUBCUTANEOUS
  Administered 2022-07-28: 2 [IU] via SUBCUTANEOUS
  Administered 2022-07-28: 1 [IU] via SUBCUTANEOUS

## 2022-07-22 MED ORDER — MIDODRINE HCL 5 MG PO TABS: 10.0000 mg | ORAL_TABLET | Freq: Three times a day (TID) | ORAL | Status: DC | PRN

## 2022-07-22 MED ORDER — ONDANSETRON HCL 4 MG PO TABS: 4.0000 mg | ORAL_TABLET | Freq: Four times a day (QID) | ORAL | Status: DC | PRN

## 2022-07-22 MED ORDER — CALCIUM ACETATE (PHOS BINDER) 667 MG PO CAPS
667.0000 mg | ORAL_CAPSULE | Freq: Three times a day (TID) | ORAL | Status: DC
  Administered 2022-07-22 – 2022-07-23 (×3): 667 mg via ORAL
  Filled 2022-07-22 (×3): qty 1

## 2022-07-22 MED ORDER — ONDANSETRON HCL 4 MG/2ML IJ SOLN
4.0000 mg | Freq: Four times a day (QID) | INTRAMUSCULAR | Status: DC | PRN
  Administered 2022-07-22 – 2022-07-26 (×6): 4 mg via INTRAVENOUS
  Filled 2022-07-22 (×6): qty 2

## 2022-07-22 MED ORDER — POLYETHYLENE GLYCOL 3350 17 G PO PACK
17.0000 g | PACK | Freq: Every day | ORAL | Status: DC | PRN
  Administered 2022-07-23: 17 g via ORAL
  Filled 2022-07-22: qty 1

## 2022-07-22 MED ORDER — CEFTRIAXONE SODIUM 2 G IJ SOLR
2.0000 g | Freq: Every day | INTRAMUSCULAR | Status: DC
  Administered 2022-07-22 – 2022-07-26 (×5): 2 g via INTRAVENOUS
  Filled 2022-07-22 (×5): qty 20

## 2022-07-22 MED ORDER — FLUTICASONE PROPIONATE 50 MCG/ACT NA SUSP
1.0000 | Freq: Two times a day (BID) | NASAL | Status: DC
  Administered 2022-07-23 – 2022-07-28 (×8): 1 via NASAL
  Filled 2022-07-22: qty 16

## 2022-07-22 MED ORDER — ALBUTEROL SULFATE (2.5 MG/3ML) 0.083% IN NEBU: 3.0000 mL | INHALATION_SOLUTION | Freq: Four times a day (QID) | RESPIRATORY_TRACT | Status: DC | PRN

## 2022-07-22 MED ORDER — METRONIDAZOLE 500 MG/100ML IV SOLN
500.0000 mg | Freq: Two times a day (BID) | INTRAVENOUS | Status: DC
  Administered 2022-07-22 – 2022-07-26 (×9): 500 mg via INTRAVENOUS
  Filled 2022-07-22 (×9): qty 100

## 2022-07-22 MED ORDER — PANTOPRAZOLE SODIUM 40 MG IV SOLR
40.0000 mg | Freq: Two times a day (BID) | INTRAVENOUS | Status: DC
  Administered 2022-07-22 – 2022-07-28 (×12): 40 mg via INTRAVENOUS
  Filled 2022-07-22 (×11): qty 10

## 2022-07-22 MED ORDER — HYDROXYZINE HCL 25 MG PO TABS: 50.0000 mg | ORAL_TABLET | Freq: Two times a day (BID) | ORAL | Status: DC | PRN

## 2022-07-22 MED ORDER — MELATONIN 5 MG PO TABS
5.0000 mg | ORAL_TABLET | Freq: Every day | ORAL | Status: DC
  Administered 2022-07-22 – 2022-07-27 (×5): 5 mg via ORAL
  Filled 2022-07-22 (×5): qty 1

## 2022-07-22 MED ORDER — CARBIDOPA-LEVODOPA 25-100 MG PO TABS
1.0000 | ORAL_TABLET | Freq: Two times a day (BID) | ORAL | Status: DC
  Administered 2022-07-22 – 2022-07-23 (×3): 1 via ORAL
  Filled 2022-07-22 (×6): qty 1

## 2022-07-22 NOTE — ED Notes (Signed)
I had an extremely difficult time trying to pull these drugs and was unable to do so. I spoke with the RN Lysbeth Galas regarding this situation when pt care was transferred over to her.

## 2022-07-22 NOTE — Progress Notes (Signed)
BLE venous duplex has been completed.   Results can be found under chart review under CV PROC. 07/22/2022 1:38 PM Devyn Sheerin RVT, RDMS

## 2022-07-22 NOTE — Consult Note (Signed)
Jean Mcclain 1946/01/17  [id number].    Requesting MD: Dr. Sherry Ruffing Chief Complaint/Reason for Consult: diverticulitis  HPI:  Jean Mcclain is a 76 yo female with a history of ESRD on HD, Parkinson's, DVT, and DM who presented to the ED yesterday evening after a syncopal event. She has been having intermittent nausea, dizziness and hypotension after dialysis, and yesterday had a syncopal event after HD. She was found to be hypotensive and brought to the ED by EMS. Hypotension improved with fluids. The patient also reported blurry vision and double vision. Vision symptoms started in the last few weeks. She has not complained of abdominal pain since arrival and denies any pain now, but when asked about abdominal symptoms reports occasional right flank pain with certain movements. She had diarrhea about 2 weeks but says otherwise her stools have been normal. She denies blood in her stools.   CT scan in the ED showed mild sigmoid inflammation with a small intramural abscess, concerning for diverticulitis. WBC is 11. The patient has never had a colonoscopy. She has no known family history of colorectal cancer. Prior abdominal surgeries include a ventral hernia repair in 2014.  ROS: Review of Systems  Constitutional:  Negative for chills and fever.  Eyes:  Positive for blurred vision and double vision.  Respiratory:  Negative for shortness of breath.   Cardiovascular:  Negative for chest pain.  Gastrointestinal:  Positive for diarrhea. Negative for abdominal pain, blood in stool and vomiting.  Neurological:  Positive for loss of consciousness. Negative for seizures.    Family History  Problem Relation Age of Onset   Leukemia Mother    Hypertension Brother    Emphysema Father     Past Medical History:  Diagnosis Date   Acute and chronic respiratory failure (acute-on-chronic) (HCC)    Acute deep vein thrombosis (DVT) of popliteal vein of left lower extremity ([place]) 03/11/2017   Anxiety     Arthritis    osteo   Chronic kidney disease    Diabetes mellitus, type 2 ([place])    Type II   DVT (deep venous thrombosis) ([place]) 03/2017   left popliteal   Facet joint disease    Facet joint disease    foot   Fibula fracture 02/2017   left   GERD (gastroesophageal reflux disease)    H/O blood clots    Hyperlipidemia    Hypertension    Macrocytic anemia    Morbid obesity (HCC)    OSA (obstructive sleep apnea)    does not use CPAP    Osteopenia    Renal disorder    MWF dialysis   Shortness of breath    with exertion   Supplemental oxygen dependent    Venous stasis ulcers ([place])    Vitamin D deficiency     Past Surgical History:  Procedure Laterality Date   ANKLE FUSION Left 06/24/2020   Procedure: LEFT TIBIOCALCANEAL FUSION;  Surgeon: [name], MD;  Location: [place] [facility];  Service: Orthopedics;  Laterality: Left;   AV FISTULA PLACEMENT Right 08/20/2018   Procedure: ARTERIOVENOUS (AV) FISTULA CREATION BRACHIOCEPHALIC;  Surgeon: Angelia Mould, MD;  Location: [facility];  Service: Vascular;  Laterality: Right;   BREAST SURGERY Right 03/1997   biospy   CARPAL TUNNEL RELEASE Left 2016   HYSTEROSCOPY WITH D & C N/A 09/29/2020   Procedure: DILATATION AND CURETTAGE /HYSTEROSCOPY;  Surgeon: Mora Bellman, MD;  Location: [facility];  Service: Gynecology;  Laterality: N/A;   INSERTION OF MESH  N/A 01/08/2013   Procedure: INSERTION OF MESH;  Surgeon: Earnstine Regal, MD;  Location: [facility];  Service: General;  Laterality: N/A;   IR THORACENTESIS ASP PLEURAL SPACE W/IMG GUIDE  04/06/2017   JOINT REPLACEMENT Right 1999   TOTAL KNEE ARTHROPLASTY Left 1999   Left   TRIGGER FINGER RELEASE Left 11/2014   TRIGGER FINGER RELEASE Right    VENTRAL HERNIA REPAIR N/A 01/08/2013   Procedure: LAPAROSCOPIC VENTRAL HERNIA;  Surgeon: Earnstine Regal, MD;  Location: [facility];  Service: General;  Laterality: N/A;    Social History:  reports that she quit smoking about 40 years ago. Her smoking use included  cigarettes. She has never used smokeless tobacco. She reports that she does not drink alcohol and does not use drugs.  Allergies:  Allergies  Allergen Reactions   Morphine Nausea Only    (Not in a hospital admission)    Physical Exam: Blood pressure (!) 114/42, pulse 62, temperature 98.6 F (37 C), temperature source Oral, resp. rate 13, SpO2 99 %. General: resting comfortably, no apparent distress Neurological: alert and oriented, no focal deficits, cranial nerves grossly in tact HEENT: normocephalic, atraumatic CV: regular rate and rhythm Respiratory: normal work of breathing, symmetric chest wall expansion Abdomen: soft, nondistended, nontender to deep palpation throughout the abdomen. No masses or organomegaly. Extremities: warm and well-perfused, no deformities, moving all extremities spontaneously Psychiatric: normal mood and affect Skin: warm and dry, no jaundice, no rashes or lesions   Results for orders placed or performed during the hospital encounter of 07/21/22 (from the past 48 hour(s))  Comprehensive metabolic panel     Status: Abnormal   Collection Time: 07/21/22  3:38 PM  Result Value Ref Range   Sodium 138 135 - 145 mmol/L   Potassium 3.7 3.5 - 5.1 mmol/L   Chloride 97 (L) 98 - 111 mmol/L   CO2 30 22 - 32 mmol/L   Glucose, Bld 135 (H) 70 - 99 mg/dL    Comment: Glucose reference range applies only to samples taken after fasting for at least 8 hours.   BUN 9 8 - 23 mg/dL   Creatinine, Ser 3.43 (H) 0.44 - 1.00 mg/dL   Calcium 8.4 (L) 8.9 - 10.3 mg/dL   Total Protein 5.6 (L) 6.5 - 8.1 g/dL   Albumin 2.9 (L) 3.5 - 5.0 g/dL   AST 9 (L) 15 - 41 U/L   ALT <5 0 - 44 U/L   Alkaline Phosphatase 102 38 - 126 U/L   Total Bilirubin 0.5 0.3 - 1.2 mg/dL   GFR, Estimated 13 (L) >60 mL/min    Comment: (NOTE) Calculated using the CKD-EPI Creatinine Equation (2021)    Anion gap 11 5 - 15    Comment: Performed at [facility], [place] [address], [place], [place]  [address]  CBC with Differential     Status: Abnormal   Collection Time: 07/21/22  3:38 PM  Result Value Ref Range   WBC 11.1 (H) 4.0 - 10.5 K/uL   RBC 3.01 (L) 3.87 - 5.11 MIL/uL   Hemoglobin 7.8 (L) 12.0 - 15.0 g/dL   HCT 26.3 (L) 36.0 - 46.0 %   MCV 87.4 80.0 - 100.0 fL   MCH 25.9 (L) 26.0 - 34.0 pg   MCHC 29.7 (L) 30.0 - 36.0 g/dL   RDW 24.0 (H) 11.5 - 15.5 %   Platelets 241 150 - 400 K/uL   nRBC 0.2 0.0 - 0.2 %   Neutrophils Relative %  87 %   Neutro Abs 9.7 (H) 1.7 - 7.7 K/uL   Lymphocytes Relative 6 %   Lymphs Abs 0.7 0.7 - 4.0 K/uL   Monocytes Relative 5 %   Monocytes Absolute 0.5 0.1 - 1.0 K/uL   Eosinophils Relative 1 %   Eosinophils Absolute 0.1 0.0 - 0.5 K/uL   Basophils Relative 0 %   Basophils Absolute 0.0 0.0 - 0.1 K/uL   WBC Morphology WHITE COUNT CONFIRMED ON SMEAR    RBC Morphology MORPHOLOGY UNREMARKABLE    Immature Granulocytes 1 %   Abs Immature Granulocytes 0.13 (H) 0.00 - 0.07 K/uL    Comment: Performed at [facility] [address], [place], [place] [address]  Type and screen [place]     Status: None   Collection Time: 07/21/22  6:38 PM  Result Value Ref Range   ABO/RH(D) O POS    Antibody Screen NEG    Sample Expiration      07/24/2022,2359 Performed at [facility], [place] [address], [place], [place] [address]   TSH     Status: Abnormal   Collection Time: 07/21/22  6:38 PM  Result Value Ref Range   TSH 0.241 (L) 0.350 - 4.500 uIU/mL    Comment: Performed by a 3rd Generation assay with a functional sensitivity of <=0.01 uIU/mL. Performed at [facility], [place] [address] [place], [place] [address]   POC occult blood, ED     Status: None   Collection Time: 07/21/22 10:49 PM  Result Value Ref Range   Fecal Occult Bld NEGATIVE NEGATIVE  CBG monitoring, ED     Status: None   Collection Time: 07/22/22 12:39 AM  Result Value Ref Range   Glucose-Capillary 97 70 - 99 mg/dL    Comment: Glucose reference range  applies only to samples taken after fasting for at least 8 hours.   Comment 1 Notify RN    Comment 2 Document in Chart   CBG monitoring, ED     Status: Abnormal   Collection Time: 07/22/22  4:34 AM  Result Value Ref Range   Glucose-Capillary 104 (H) 70 - 99 mg/dL    Comment: Glucose reference range applies only to samples taken after fasting for at least 8 hours.  CBC     Status: Abnormal   Collection Time: 07/22/22  4:38 AM  Result Value Ref Range   WBC 9.2 4.0 - 10.5 K/uL   RBC 2.76 (L) 3.87 - 5.11 MIL/uL   Hemoglobin 7.1 (L) 12.0 - 15.0 g/dL   HCT 24.0 (L) 36.0 - 46.0 %   MCV 87.0 80.0 - 100.0 fL   MCH 25.7 (L) 26.0 - 34.0 pg   MCHC 29.6 (L) 30.0 - 36.0 g/dL   RDW 24.0 (H) 11.5 - 15.5 %   Platelets 231 150 - 400 K/uL   nRBC 0.0 0.0 - 0.2 %    Comment: Performed at [facility] [address] [place], [place] [address]  Basic metabolic panel     Status: Abnormal   Collection Time: 07/22/22  4:38 AM  Result Value Ref Range   Sodium 138 135 - 145 mmol/L   Potassium 3.7 3.5 - 5.1 mmol/L   Chloride 101 98 - 111 mmol/L   CO2 30 22 - 32 mmol/L   Glucose, Bld 105 (H) 70 - 99 mg/dL    Comment: Glucose reference range applies only to samples taken after fasting for at least 8  hours.   BUN 11 8 - 23 mg/dL   Creatinine, Ser 3.71 (H) 0.44 - 1.00 mg/dL   Calcium 8.0 (L) 8.9 - 10.3 mg/dL   GFR, Estimated 12 (L) >60 mL/min    Comment: (NOTE) Calculated using the CKD-EPI Creatinine Equation (2021)    Anion gap 7 5 - 15    Comment: Performed at [facility] [address], [place], [place] [address]  Hemoglobin A1c     Status: Abnormal   Collection Time: 07/22/22  4:38 AM  Result Value Ref Range   Hgb A1c MFr Bld 6.9 (H) 4.8 - 5.6 %    Comment: (NOTE) Pre diabetes:          5.7%-6.4%  Diabetes:              >6.4%  Glycemic control for   <7.0% adults with diabetes    Mean Plasma Glucose 151.33 mg/dL    Comment: Performed at [facility] [address]  [place], [place] [address]   CT ABDOMEN PELVIS W CONTRAST  Result Date: 07/21/2022 CLINICAL DATA:  Abdominal pain, nausea/vomiting EXAM: CT ABDOMEN AND PELVIS WITH CONTRAST TECHNIQUE: Multidetector CT imaging of the abdomen and pelvis was performed using the standard protocol following bolus administration of intravenous contrast. RADIATION DOSE REDUCTION: This exam was performed according to the departmental dose-optimization program which includes automated exposure control, adjustment of the mA and/or kV according to patient size and/or use of iterative reconstruction technique. CONTRAST:  41m OMNIPAQUE IOHEXOL 350 MG/ML SOLN COMPARISON:  01/09/2022 and 07/28/2020 FINDINGS: Lower chest: Lung bases are clear. Hepatobiliary: Liver is within normal limits. Gallbladder is unremarkable. No intrahepatic or extrahepatic duct dilatation. Pancreas: Within normal limits. Spleen: Within normal limits. Adrenals/Urinary Tract: Right adrenal gland is within normal limits. At least 2 left adrenal nodules measuring up to 10 mm (series 3/image 22), unchanged from 2021, favoring benign adrenal adenomas. Left renal cortical atrophy. 10 mm right lower pole simple cyst (series 3/image 38), benign (Bosniak I). However, there is a 16 mm hyperdense lesion in the anterior right upper kidney (series 3/image 32), similar to the most recent prior but previously measuring 9 mm in 2021. Differential considerations include benign hemorrhagic cyst versus small papillary renal cell carcinoma. No hydronephrosis. Mildly thick-walled bladder, although underdistended. Stomach/Bowel: Stomach is within normal limits. No evidence of bowel obstruction. Normal appendix (series 3/image 60). Sigmoid diverticulosis with mild pericolonic inflammatory changes in the left lower quadrant, suggesting sigmoid diverticulitis, and a suspected 1.5 x 2.5 cm intramural abscess (series 3/image 61). No free air. Vascular/Lymphatic: No evidence of abdominal aortic  aneurysm. Atherosclerotic calcifications of the abdominal aorta and branch vessels. No suspicious abdominopelvic lymphadenopathy. Reproductive: Uterus is within normal limits. Bilateral ovaries are grossly unremarkable. Other: No abdominopelvic ascites. Musculoskeletal: Degenerative changes of the visualized thoracolumbar spine. IMPRESSION: Acute sigmoid diverticulitis with suspected 2.5 cm intramural abscess. No free air. 16 mm hyperdense lesion in the anterior right upper kidney, mildly increased from 2021. Differential considerations include benign hemorrhagic cyst versus small papillary renal cell carcinoma. Consider follow-up MRI abdomen with/without contrast in 6 months. Additional ancillary findings as above. Electronically Signed   By: SJulian HyM.D.   On: 07/21/2022 23:43   MR BRAIN WO CONTRAST  Result Date: 07/21/2022 CLINICAL DATA:  [place] EXAM: MRI HEAD WITHOUT CONTRAST TECHNIQUE: Multiplanar, multiecho pulse sequences of the brain and surrounding structures were obtained without intravenous contrast. COMPARISON:  None Available. FINDINGS: Brain: No acute infarct, mass effect or extra-axial  collection. No acute or chronic hemorrhage. There is multifocal hyperintense T2-weighted signal within the white matter. Parenchymal volume and CSF spaces are normal. The midline structures are normal. Vascular: Major flow voids are preserved. Skull and upper cervical spine: Normal calvarium and skull base. Visualized upper cervical spine and soft tissues are normal. Sinuses/Orbits:No paranasal sinus fluid levels or advanced mucosal thickening. No mastoid or middle ear effusion. Normal orbits. IMPRESSION: 1. No acute intracranial abnormality. 2. Findings of chronic small vessel ischemia. Electronically Signed   By: Ulyses Jarred M.D.   On: 07/21/2022 22:13   DG Chest Portable 1 View  Result Date: 07/21/2022 CLINICAL DATA:  Hypoxia, hypertension EXAM: PORTABLE CHEST 1 VIEW COMPARISON:  Chest  01/09/2022 FINDINGS: Heart size upper normal. Vascularity normal. Lungs clear without infiltrate or effusion. Bilateral shoulder subluxation appears chronic and unchanged. IMPRESSION: No active disease. Electronically Signed   By: Franchot Gallo M.D.   On: 07/21/2022 19:07      Assessment/Plan This is a 76 yo female presenting with complaints of vision changes and syncope, with reported nausea and abdominal pain during dialysis. Abdominal CT shows mild sigmoid stranding with a small intramural abscess, consistent with diverticulitis. There is no free intraperitoneal air. She does not have any abdominal tenderness on exam and denies pain. As this abscess is very small and intramural I do not recommend drainage. Anticipate this will resolve with antibiotics alone.  - Continue ceftriaxone/flagyl - Ok for full liquid diet - No indication for acute surgical intervention - Recommend outpatient colonoscopy in 6-8 weeks - General surgery will follow  Level of medical decision-making: Moderate  Michaelle Birks, [facility], Hepatobiliary and Pancreatic Surgery 07/22/22 6:34 AM

## 2022-07-22 NOTE — Progress Notes (Signed)
PROGRESS NOTE                                                                                                                                                                                                             Patient Demographics:    Jean Mcclain, is a 76 y.o. female, DOB - [birth year], WJX:[id number]  Outpatient Primary MD for the patient is Pcp, No    LOS - 0  Admit date - 07/21/2022    Chief Complaint  Patient presents with   Hypotension   Blurred Vision       Brief Narrative (HPI from H&P)   76 y.o. female with medical history significant of ESRD on HD, full treatment yesterday, DM2, prior DVT on eliquis, Parkinson's, chronic hypotension on midodrine on her dialysis days, who lives at an SNF and has been having blurry vision for the last several months was brought in after she became severely hypotensive with blurry vision, systolic blood pressure was in 70s she was brought to the ER where work-up showed asymptomatic diverticular abscess however she had significant drop in her hemoglobin from a baseline of about 12-8, no known blood in stool or melena, initial Hemoccult negative.   Subjective:    Jean Mcclain today has, No headache, No chest pain, No abdominal pain - No Nausea, No new weakness tingling or numbness, no shortness of breath, generalized weakness and ongoing mild blurry vision in both eyes for the last several months.   Assessment  & Plan :    Symptomatic hypotension and anemia in a patient with incidental finding of diverticular abscess, underlying history of anemia of chronic disease and ESRD. She is surprisingly pain and fever free, question the diagnosis of diverticular abscess, seen by general surgery and currently on clear liquid diet and IV antibiotics.  Her main issue seems to be a significant drop in her hemoglobin, no known blood in stool or melena.  Will transfuse 1 unit of packed RBC on  07/22/2022, hold Eliquis, check anemia panel and DIC panel, GI input.  Monitor CBC and clinically closely.  Acute anemia of unclear etiology in a patient with anemia of chronic disease.  Currently see #1 above.  Ongoing blurry vision for several months.  MRI brain negative, probably got worsened due to hypotension, now close to baseline, outpatient ophthalmology follow-up.  ESRD.  On MWF schedule.  Nephrology consulted.  Hypotension.  Scheduled midodrine on the days of dialysis, as needed otherwise.  Check cortisol and TSH, check echo.  Kidney lesion - Mildly increased in size since 2021. Consider follow up MRI abd in 6 months arranged by PCP.  HX of Parkinson's.  Continue home medications.    History of DVT.  Patient thinks it several months ago.  Last venous duplex in our system from 2020 is negative.  Hold anticoagulation for now due to severe anemia.  Repeat venous duplex here to look for any acute clots.  Insulin-requiring or dependent type II diabetes mellitus ([place]) - Very sensitive SSI Q4H for the moment.  Lab Results  Component Value Date   HGBA1C 6.9 (H) 07/22/2022   CBG (last 3)  Recent Labs    07/22/22 0039 07/22/22 0434  GLUCAP 97 104*        Condition - Extremely Guarded  Family Communication  :  None  Code Status :  DNR  Consults  :  CCS, Renal, GI  PUD Prophylaxis : PPI   Procedures  :     Leg [place] -  TTE -  MRI - 1. No acute intracranial abnormality. 2. Findings of chronic small vessel ischemia  CT -  Acute sigmoid diverticulitis with suspected 2.5 cm intramural abscess. No free air. 16 mm hyperdense lesion in the anterior right upper kidney, mildly increased from 2021. Differential considerations include benign hemorrhagic cyst versus small papillary renal cell carcinoma. Consider follow-up MRI abdomen with/without contrast in 6 months. Additional ancillary findings as above      Disposition Plan  :    Status is: Observation  DVT Prophylaxis  :     Place and maintain sequential compression device Start: 07/22/22 0553   Lab Results  Component Value Date   PLT 231 07/22/2022    Diet :  Diet Order             Diet clear liquid Room service appropriate? Yes; Fluid consistency: Thin  Diet effective now                    Inpatient Medications  Scheduled Meds:  sodium chloride   Intravenous Once   atorvastatin  20 mg Oral Daily   [START ON 07/27/2022] buprenorphine  1 patch Transdermal Weekly   calcium acetate  667 mg Oral TID WC   carbidopa-levodopa  1 tablet Oral BID   carbidopa-levodopa  2 tablet Oral q AM   docusate sodium  200 mg Oral QHS   doxercalciferol  2 mcg Intravenous Q M,W,F-HD   fluticasone  1 spray Each Nare BID   gabapentin  300 mg Oral QHS   insulin aspart  0-6 Units Subcutaneous Q4H   melatonin  5 mg Oral QHS   midodrine  20 mg Oral Q M,W,F-HD   mirtazapine  15 mg Oral QHS   nystatin  1 Application Topical B8675   pantoprazole (PROTONIX) IV  40 mg Intravenous Q12H   rOPINIRole  12 mg Oral Daily   tamsulosin  0.4 mg Oral QPC supper   Continuous Infusions:  cefTRIAXone (ROCEPHIN)  IV Stopped (07/22/22 0109)   metronidazole Stopped (07/22/22 0242)   PRN Meds:.acetaminophen **OR** acetaminophen, albuterol, camphor-menthol, diphenhydrAMINE, hydrOXYzine, midodrine, [DISCONTINUED] ondansetron **OR** ondansetron (ZOFRAN) IV, polyethylene glycol, simethicone  Antibiotics  :    Anti-infectives (From admission, onward)    Start     Dose/Rate Route Frequency Ordered Stop   07/22/22 0015  cefTRIAXone (  ROCEPHIN) 2 g in sodium chloride 0.9 % 100 mL IVPB        2 g 200 mL/hr over 30 Minutes Intravenous Daily at 10 pm 07/22/22 0006     07/22/22 0015  metroNIDAZOLE (FLAGYL) IVPB 500 mg        500 mg 100 mL/hr over 60 Minutes Intravenous 2 times daily 07/22/22 0006     07/22/22 0000  piperacillin-tazobactam (ZOSYN) IVPB 3.375 g  Status:  Discontinued        3.375 g 100 mL/hr over 30 Minutes  Intravenous  Once 07/21/22 2349 07/22/22 0006         Objective:   Vitals:   07/22/22 0400 07/22/22 0448 07/22/22 0545 07/22/22 0700  BP: (!) 114/42  120/68 (!) 120/49  Pulse: 62  72 67  Resp: '13  11 15  '$ Temp:  98.6 F (37 C)    TempSrc:  Oral    SpO2: 99%  97% 97%    Wt Readings from Last 3 Encounters:  06/21/22 73.9 kg  02/11/22 80.4 kg  01/14/22 80.3 kg     Intake/Output Summary (Last 24 hours) at 07/22/2022 0830 Last data filed at 07/22/2022 0109 Gross per 24 hour  Intake 100 ml  Output 500 ml  Net -400 ml     Physical Exam  Awake Alert, No new F.N deficits, Normal affect [place].AT,PERRAL Supple Neck, No JVD,   Symmetrical Chest wall movement, Good air movement bilaterally, CTAB RRR,No Gallops,Rubs or new Murmurs,  +ve B.Sounds, Abd Soft, No tenderness,   1+ edema       Data Review:    CBC Recent Labs  Lab 07/21/22 1538 07/22/22 0438  WBC 11.1* 9.2  HGB 7.8* 7.1*  HCT 26.3* 24.0*  PLT 241 231  MCV 87.4 87.0  MCH 25.9* 25.7*  MCHC 29.7* 29.6*  RDW 24.0* 24.0*  LYMPHSABS 0.7  --   MONOABS 0.5  --   EOSABS 0.1  --   BASOSABS 0.0  --     Electrolytes Recent Labs  Lab 07/21/22 1538 07/21/22 1838 07/22/22 0438  NA 138  --  138  K 3.7  --  3.7  CL 97*  --  101  CO2 30  --  30  GLUCOSE 135*  --  105*  BUN 9  --  11  CREATININE 3.43*  --  3.71*  CALCIUM 8.4*  --  8.0*  AST 9*  --   --   ALT <5  --   --   ALKPHOS 102  --   --   BILITOT 0.5  --   --   ALBUMIN 2.9*  --   --   TSH  --  0.241*  --   HGBA1C  --   --  6.9*    ------------------------------------------------------------------------------------------------------------------ No results for input(s): "CHOL", "HDL", "LDLCALC", "TRIG", "CHOLHDL", "LDLDIRECT" in the last 72 hours.  Lab Results  Component Value Date   HGBA1C 6.9 (H) 07/22/2022    Recent Labs    07/21/22 1838  TSH 0.241*    ------------------------------------------------------------------------------------------------------------------ ID Labs Recent Labs  Lab 07/21/22 1538 07/22/22 0438  WBC 11.1* 9.2  PLT 241 231  CREATININE 3.43* 3.71*   Cardiac Enzymes No results for input(s): "CKMB", "TROPONINI", "MYOGLOBIN" in the last 168 hours.  Invalid input(s): "CK"    Micro Results No results found for this or any previous visit (from the past 240 hour(s)).  Radiology Reports CT ABDOMEN PELVIS W CONTRAST  Result Date: 07/21/2022 CLINICAL DATA:  Abdominal pain, nausea/vomiting EXAM: CT ABDOMEN AND PELVIS WITH CONTRAST TECHNIQUE: Multidetector CT imaging of the abdomen and pelvis was performed using the standard protocol following bolus administration of intravenous contrast. RADIATION DOSE REDUCTION: This exam was performed according to the departmental dose-optimization program which includes automated exposure control, adjustment of the mA and/or kV according to patient size and/or use of iterative reconstruction technique. CONTRAST:  61m OMNIPAQUE IOHEXOL 350 MG/ML SOLN COMPARISON:  01/09/2022 and 07/28/2020 FINDINGS: Lower chest: Lung bases are clear. Hepatobiliary: Liver is within normal limits. Gallbladder is unremarkable. No intrahepatic or extrahepatic duct dilatation. Pancreas: Within normal limits. Spleen: Within normal limits. Adrenals/Urinary Tract: Right adrenal gland is within normal limits. At least 2 left adrenal nodules measuring up to 10 mm (series 3/image 22), unchanged from 2021, favoring benign adrenal adenomas. Left renal cortical atrophy. 10 mm right lower pole simple cyst (series 3/image 38), benign (Bosniak I). However, there is a 16 mm hyperdense lesion in the anterior right upper kidney (series 3/image 32), similar to the most recent prior but previously measuring 9 mm in 2021. Differential considerations include benign hemorrhagic cyst versus small papillary renal cell carcinoma. No  hydronephrosis. Mildly thick-walled bladder, although underdistended. Stomach/Bowel: Stomach is within normal limits. No evidence of bowel obstruction. Normal appendix (series 3/image 60). Sigmoid diverticulosis with mild pericolonic inflammatory changes in the left lower quadrant, suggesting sigmoid diverticulitis, and a suspected 1.5 x 2.5 cm intramural abscess (series 3/image 61). No free air. Vascular/Lymphatic: No evidence of abdominal aortic aneurysm. Atherosclerotic calcifications of the abdominal aorta and branch vessels. No suspicious abdominopelvic lymphadenopathy. Reproductive: Uterus is within normal limits. Bilateral ovaries are grossly unremarkable. Other: No abdominopelvic ascites. Musculoskeletal: Degenerative changes of the visualized thoracolumbar spine. IMPRESSION: Acute sigmoid diverticulitis with suspected 2.5 cm intramural abscess. No free air. 16 mm hyperdense lesion in the anterior right upper kidney, mildly increased from 2021. Differential considerations include benign hemorrhagic cyst versus small papillary renal cell carcinoma. Consider follow-up MRI abdomen with/without contrast in 6 months. Additional ancillary findings as above. Electronically Signed   By: SJulian HyM.D.   On: 07/21/2022 23:43   MR BRAIN WO CONTRAST  Result Date: 07/21/2022 CLINICAL DATA:  Diplopia EXAM: MRI HEAD WITHOUT CONTRAST TECHNIQUE: Multiplanar, multiecho pulse sequences of the brain and surrounding structures were obtained without intravenous contrast. COMPARISON:  None Available. FINDINGS: Brain: No acute infarct, mass effect or extra-axial collection. No acute or chronic hemorrhage. There is multifocal hyperintense T2-weighted signal within the white matter. Parenchymal volume and CSF spaces are normal. The midline structures are normal. Vascular: Major flow voids are preserved. Skull and upper cervical spine: Normal calvarium and skull base. Visualized upper cervical spine and soft tissues are  normal. Sinuses/Orbits:No paranasal sinus fluid levels or advanced mucosal thickening. No mastoid or middle ear effusion. Normal orbits. IMPRESSION: 1. No acute intracranial abnormality. 2. Findings of chronic small vessel ischemia. Electronically Signed   By: KUlyses JarredM.D.   On: 07/21/2022 22:13   DG Chest Portable 1 View  Result Date: 07/21/2022 CLINICAL DATA:  Hypoxia, hypertension EXAM: PORTABLE CHEST 1 VIEW COMPARISON:  Chest 01/09/2022 FINDINGS: Heart size upper normal. Vascularity normal. Lungs clear without infiltrate or effusion. Bilateral shoulder subluxation appears chronic and unchanged. IMPRESSION: No active disease. Electronically Signed   By: CFranchot GalloM.D.   On: 07/21/2022 19:07      Signature  PLala LundM.D on 07/22/2022 at 8:30 AM   -  To page go to [email or web address]

## 2022-07-22 NOTE — ED Notes (Signed)
Pt is with a provider. BGL will be obtained when she leaves.

## 2022-07-22 NOTE — Assessment & Plan Note (Addendum)
Full treatment of HD yesterday. Call nephro in AM for HD during stay. Cont home midodrine for dialysis associated hypotension.

## 2022-07-22 NOTE — Assessment & Plan Note (Addendum)
1. Rocephin + Flagyl 2. gen surg will see in consult 3. NPO pending gen surg consult 4. 2.5cm intramural abscess, so likely not candidate for IR drainage. 5. Suspect gen surg will want to try non-invasive management in this chronically ill patient with multiple medical conditions including ESRD. 6. Will hold off on ordering maint IVF given ESRD. 7. Suspect the diverticulitis is responsible for her symptoms of nausea, vomiting, abd pain, and hypotension post dialysis that is worse than her baseline and syncope today. 1. Will keep on tele monitor and order a 2d echo for syncope as well though.

## 2022-07-22 NOTE — Procedures (Signed)
   I was present at this dialysis session, have reviewed the session itself and made  appropriate changes Kelly Splinter MD [place] pager [phone]   07/22/2022, 3:17 PM

## 2022-07-22 NOTE — Progress Notes (Signed)
Received patient in bed to unit.  Alert and oriented.  Informed consent signed and in chart.   Treatment initiated: 1554 Treatment completed: 1947  Patient tolerated well.  Transported back to the room  Alert, without acute distress.  Hand-off given to patient's nurse.   Access used: fistula Access issues: none  Total UF removed: 2000 ml Medication(s) given: none Post HD VS: 97.9 100/57 66 18 100% [place] 2L Post HD weight: 66.7kg   [facility]

## 2022-07-22 NOTE — Assessment & Plan Note (Addendum)
Mildly increased in size since 2021. 1. Consider follow up MRI abd in 6 months 2. Though I'm not sure shes a great candidate for surgical intervention anyhow.

## 2022-07-22 NOTE — ED Notes (Signed)
Vascular to do an ultrasound. Mari.

## 2022-07-22 NOTE — Consult Note (Signed)
Renal Service Consult Note [facility]  Jean Mcclain 07/22/2022 Sol Blazing, MD Requesting Physician: Dr Mamie Nick. Singh  Reason for Consult: ESRD pt w/ diverticulitis HPI: The patient is a 76 y.o. year-old w/ hx of anxiety, esrd on HD, DM2, DVT, HL,  HTN, parkinson's , lives at [facility] who presented to ED for blurred vision, abd pain and N/V and hypotension at SNF w/ BP in the 70s. In ED w/u showed diverticular abscess and low Hb.  FOB negative. Pt admitted and gen surgery consulted. We are asked to see for ESRD.   Pt seen in ED.  No c/o's at this time. Has not missed HD.  ROS - denies CP, no joint pain, no HA, no blurry vision, no rash, no diarrhea,no dysuria, no difficulty voiding   Past Medical History  Past Medical History:  Diagnosis Date   Acute and chronic respiratory failure (acute-on-chronic) (HCC)    Acute deep vein thrombosis (DVT) of popliteal vein of left lower extremity ([place]) 03/11/2017   Anxiety    Arthritis    osteo   Chronic kidney disease    Diabetes mellitus, type 2 ([place])    Type II   DVT (deep venous thrombosis) ([place]) 03/2017   left popliteal   Facet joint disease    Facet joint disease    foot   Fibula fracture 02/2017   left   GERD (gastroesophageal reflux disease)    H/O blood clots    Hyperlipidemia    Hypertension    Macrocytic anemia    Morbid obesity (HCC)    OSA (obstructive sleep apnea)    does not use CPAP    Osteopenia    Renal disorder    MWF dialysis   Shortness of breath    with exertion   Supplemental oxygen dependent    Venous stasis ulcers ([place])    Vitamin D deficiency    Past Surgical History  Past Surgical History:  Procedure Laterality Date   ANKLE FUSION Left 06/24/2020   Procedure: LEFT TIBIOCALCANEAL FUSION;  Surgeon: [name], MD;  Location: [place];  Service: Orthopedics;  Laterality: Left;   AV FISTULA PLACEMENT Right 08/20/2018   Procedure: ARTERIOVENOUS (AV) FISTULA CREATION BRACHIOCEPHALIC;   Surgeon: Angelia Mould, MD;  Location: [place];  Service: Vascular;  Laterality: Right;   BREAST SURGERY Right 03/1997   biospy   CARPAL TUNNEL RELEASE Left 2016   HYSTEROSCOPY WITH D & C N/A 09/29/2020   Procedure: DILATATION AND CURETTAGE /HYSTEROSCOPY;  Surgeon: Mora Bellman, MD;  Location: [facility];  Service: Gynecology;  Laterality: N/A;   INSERTION OF MESH N/A 01/08/2013   Procedure: INSERTION OF MESH;  Surgeon: Earnstine Regal, MD;  Location: [facility];  Service: General;  Laterality: N/A;   IR THORACENTESIS ASP PLEURAL SPACE W/IMG GUIDE  04/06/2017   JOINT REPLACEMENT Right 1999   TOTAL KNEE ARTHROPLASTY Left 1999   Left   TRIGGER FINGER RELEASE Left 11/2014   TRIGGER FINGER RELEASE Right    VENTRAL HERNIA REPAIR N/A 01/08/2013   Procedure: LAPAROSCOPIC VENTRAL HERNIA;  Surgeon: Earnstine Regal, MD;  Location: [facility];  Service: General;  Laterality: N/A;   Family History  Family History  Problem Relation Age of Onset   Leukemia Mother    Hypertension Brother    Emphysema Father    Social History  reports that she quit smoking about 40 years ago. Her smoking use included cigarettes. She has never used smokeless tobacco. She reports  that she does not drink alcohol and does not use drugs. Allergies  Allergies  Allergen Reactions   Morphine Nausea Only   Home medications Prior to Admission medications   Medication Sig Start Date End Date Taking? Authorizing Provider  acetaminophen (TYLENOL) 500 MG tablet Take 2 tablets (1,000 mg total) by mouth 3 (three) times daily. 01/14/22  Yes Dahal, Marlowe Aschoff, MD  albuterol (VENTOLIN HFA) 108 (90 Base) MCG/ACT inhaler Inhale 2 puffs into the lungs every 6 (six) hours as needed for wheezing or shortness of breath.  03/26/20  Yes [provider]  apixaban (ELIQUIS) 2.5 MG TABS tablet Take 2.5 mg by mouth 2 (two) times daily.   Yes [provider]  atorvastatin (LIPITOR) 20 MG tablet Take 20 mg by mouth daily. 08/24/21  Yes [provider]  buprenorphine (BUTRANS) 7.5 MCG/HR Place 1 patch onto the skin once a week. Wednesday   Yes [provider]  camphor-menthol Timoteo Ace) lotion Apply 1 Application topically daily as needed for itching. Apply topically to trunk of body, arms legs once daily for itching   Yes [provider]  carbidopa-levodopa (SINEMET IR) 25-100 MG tablet Take 1-2 tablets by mouth See admin instructions. Take 2 tablets by mouth in the morning and 1 tablet twice a day   Yes [provider]  docusate sodium (COLACE) 100 MG capsule Take 200 mg by mouth at bedtime.   Yes [provider]  fluticasone (FLONASE) 50 MCG/ACT nasal spray Place 1 spray into both nostrils 2 (two) times daily.   Yes [provider]  gabapentin (NEURONTIN) 300 MG capsule Take 300 mg by mouth at bedtime.   Yes [provider]  hydrOXYzine (ATARAX) 50 MG tablet Take 50 mg by mouth 2 (two) times daily as needed for anxiety or itching. HOLD WHEN OUT OF COMMUNITY FOR DIALYSIS TX ON Monday, Wednesday & FRIDAY 08/24/21  Yes [provider]  insulin aspart (NOVOLOG) 100 UNIT/ML injection Inject 0-5 Units into the skin at bedtime. Patient taking differently: Inject 3-9 Units into the skin 4 (four) times daily -  before meals and at bedtime. Per sliding scale: 70-100= 3 units, 101-150= 4 units, 151-200= 5 units, 201-250= 6 units, 251-300= 7 units, 310-350= 8 units, 351-400= 9 units, if greater than 400 units call 911 01/14/22  Yes Dahal, Marlowe Aschoff, MD  insulin NPH Human (NOVOLIN N) 100 UNIT/ML injection Inject 0.05 mLs (5 Units total) into the skin 2 (two) times daily at 8 am and 10 pm. 01/14/22  Yes Dahal, Marlowe Aschoff, MD  melatonin 5 MG TABS Take 5 mg by mouth at bedtime.   Yes [provider]  midodrine (PROAMATINE) 10 MG tablet Take 20 mg by mouth See admin instructions. Give 20 mg on Mon, Wed, Fri prior to Dialysis treatment and 1 tablet half way through treatment 08/24/21  Yes  [provider]  mirtazapine (REMERON) 15 MG tablet Take 15 mg by mouth at bedtime.   Yes [provider]  nystatin [facility]) powder Apply 1 Application topically daily at 12 noon. Apply topically to groin and under breast once daily   Yes [provider]  ondansetron (ZOFRAN) 8 MG tablet Take 8 mg by mouth every 8 (eight) hours as needed for nausea or vomiting.   Yes [provider]  OXYGEN Inhale 2 L into the lungs as needed (shortness of breath).   Yes [provider]  pantoprazole (PROTONIX) 40 MG tablet Take 40 mg by mouth daily.   Yes [provider]  polyethylene glycol (MIRALAX / GLYCOLAX) 17 g packet Take 17 g by mouth daily as needed for mild constipation.   Yes [provider]  Ropinirole HCl 12 MG TB24 Take 12 mg by mouth daily.   Yes [provider]  simethicone (MYLICON) 161 MG chewable tablet Chew 125 mg by mouth every 12 (twelve) hours as needed for flatulence.   Yes [provider]  tamsulosin (FLOMAX) 0.4 MG CAPS capsule Take 1 capsule (0.4 mg total) by mouth daily. Patient taking differently: Take 0.4 mg by mouth at bedtime. 01/16/22  Yes Dahal, Marlowe Aschoff, MD  B-D INS SYR ULTRAFINE 1CC/30G 30G X 1/2" 1 ML MISC 1 each by Other route daily.  12/23/19   [provider]  Blood Glucose Monitoring Suppl ([place]) w/Device KIT  03/23/20   [provider]  calcium acetate (PHOSLO) 667 MG tablet Take 667 mg by mouth in the morning, at noon, in the evening, and at bedtime.  11/28/19   [provider]  doxercalciferol (HECTOROL) 4 MCG/2ML injection Inject 1 mL (2 mcg total) into the vein every Monday, Wednesday, and Friday with hemodialysis. 01/27/20   [place] Eric J, DO  glucose blood test strip 1 each by Other route daily.     [provider]  Lancets [facility] DELICA PLUS [id number]) [place] 1 each by Other route daily.  03/05/19   [provider]      Vitals:   07/22/22 0448 07/22/22 0545 07/22/22 0700 07/22/22 0857  BP:  120/68 (!) 120/49 (!) 118/43  Pulse:  72 67 70  Resp:  '11 15 16  ' Temp: 98.6 F (37 C)   98.2 F (36.8 C)  TempSrc: Oral   Oral  SpO2:  97% 97% 97%   Exam Gen alert, no distress, [place] O2 No rash, cyanosis or gangrene Sclera anicteric, throat clear  No jvd or bruits Chest clear bilat to bases, no rales/ wheezing RRR no MRG Abd soft ntnd no mass or ascites +bs MS no joint effusions or deformity Ext 1-2+ mild ankle/ pretib edema, no wounds or ulcers Neuro is alert, Ox 3 , nf    RUA AVF+bruit   Home meds include - albuterol, apixaban, atorvastatin, buprenorphine, carbidopa-levodopa, gabapentin, insulin aspart/ NPH, midodrine 20 pre hd mwf, mirtazapine, pantoprazole, ropinirole, tamsulosin, calcium acetate 1 ac tid, prns/ vits/ supps   OP HD: MWF [facility]  3.5h  400/1.5  70kg   2/2 bath  Hep none  RUA AVF - last HD 10/11 post wt 71.1kg - last Hb 7.3 - mircera 225 ug q 2, last 10/9 - calcitriol 0.5 ug po tiw mwf    CXR - no active disease   Assessment/ Plan: Diverticulitis - w/ small abscess, getting IV abx, gen surg consulting.  ESRD - on HD MWF. No missed HD. HD today.  Chronic hypotension - cont home midodrine pre HD 62m.  IDDM - per pmd Volume - mild edema LE's, get wts pre/ post HD if can stand. UF 2.5 L as tolerated.  Anemia esrd - Hb 7- 8 range, next esa due 10/23. Tranfuse prn.  MBD ckd - CCa in range, add on phos. Cont po vdra and phoslo as binder.    RKelly Splinter MD 07/22/2022, 10:50 AM Recent Labs  Lab 07/21/22 1538 07/22/22 0438  HGB 7.8* 7.1*  ALBUMIN 2.9*  --   CALCIUM 8.4* 8.0*  CREATININE 3.43* 3.71*  K 3.7 3.7   Inpatient medications:  sodium chloride  Intravenous Once   atorvastatin  20 mg Oral Daily   [START ON 07/27/2022] buprenorphine  1 patch Transdermal Weekly   calcium acetate  667 mg Oral TID WC   carbidopa-levodopa  1 tablet Oral BID   carbidopa-levodopa  2  tablet Oral q AM   docusate sodium  200 mg Oral QHS   doxercalciferol  2 mcg Intravenous Q M,W,F-HD   fluticasone  1 spray Each Nare BID   gabapentin  300 mg Oral QHS   insulin aspart  0-6 Units Subcutaneous Q4H   melatonin  5 mg Oral QHS   midodrine  20 mg Oral Q M,W,F-HD   mirtazapine  15 mg Oral QHS   nystatin  1 Application Topical [id number]   pantoprazole (PROTONIX) IV  40 mg Intravenous Q12H   rOPINIRole  12 mg Oral Daily   tamsulosin  0.4 mg Oral QPC supper    cefTRIAXone (ROCEPHIN)  IV Stopped (07/22/22 0109)   metronidazole 500 mg (07/22/22 1005)   acetaminophen **OR** acetaminophen, albuterol, camphor-menthol, diphenhydrAMINE, hydrOXYzine, midodrine, [DISCONTINUED] ondansetron **OR** ondansetron (ZOFRAN) IV, polyethylene glycol, simethicone

## 2022-07-22 NOTE — Assessment & Plan Note (Signed)
Very sensitive SSI Q4H for the moment.

## 2022-07-22 NOTE — Assessment & Plan Note (Signed)
HGB down since April. Hemoccult neg today Repeat HGB in AM.

## 2022-07-22 NOTE — H&P (Addendum)
History and Physical    Patient: Jean Mcclain DOB: [birth year] DOA: 07/21/2022 DOS: the patient was seen and examined on 07/22/2022 PCP: Pcp, No  Patient coming from: Home  Chief Complaint:  Chief Complaint  Patient presents with   Hypotension   Blurred Vision   HPI: Jean Mcclain is a 76 y.o. female with medical history significant of ESRD on HD, full treatment yesterday, DM2, prior DVT on eliquis.  Some Parkinson's dz on sinemet.  Low BPs, looks like shes on midodrine.  Pt in to ED with progressively worsening blurry vision over past couple of months.  More recently having hypotension post dialysis with intermittent dizziness, nausea, vomiting, and abd pain.  Yesterday after  dialysis had syncope episode, EMS called, BP 72/62, and pt brought in to ED.  BP improved with IVF.  Patient however is less concerned about the lightheadedness and syncope as she says she had lightheadedness in the setting of dialysis in the past with low blood pressures but she is more concerned about her symptoms have been progressing for the last few months and even the last few weeks worsening.  She says she has had some blurry vision bilaterally and now is having diplopia.  She said that she also having some hearing loss that feels like it is worse in the left ear and is having intermittent room spinning dizziness.  She is never had this before.  She says that she is also had some intermittent nausea and vomiting and has had some pain in her right abdomen and flank that feels new but she cannot say when it started.  She reports she is not having anterior abdominal pain where she has had her ventral hernia surgery before.  She reports no constipation or diarrhea at this time.  She denies any urinary changes does report she makes some urine.  Patient has had CT scans with contrast in the past and agrees to get contrast if she needs it.  Patient previous CT did show some abnormality of the  kidney that was concerning for possible neoplasm versus atypical cyst with hemorrhage.   Review of Systems: As mentioned in the history of present illness. All other systems reviewed and are negative. Past Medical History:  Diagnosis Date   Acute and chronic respiratory failure (acute-on-chronic) (HCC)    Acute deep vein thrombosis (DVT) of popliteal vein of left lower extremity ([place]) 03/11/2017   Anxiety    Arthritis    osteo   Chronic kidney disease    Diabetes mellitus, type 2 (HCC)    Type II   DVT (deep venous thrombosis) ([place]) 03/2017   left popliteal   Facet joint disease    Facet joint disease    foot   Fibula fracture 02/2017   left   GERD (gastroesophageal reflux disease)    H/O blood clots    Hyperlipidemia    Hypertension    Macrocytic anemia    Morbid obesity (HCC)    OSA (obstructive sleep apnea)    does not use CPAP    Osteopenia    Renal disorder    MWF dialysis   Shortness of breath    with exertion   Supplemental oxygen dependent    Venous stasis ulcers ([place])    Vitamin D deficiency    Past Surgical History:  Procedure Laterality Date   ANKLE FUSION Left 06/24/2020   Procedure: LEFT TIBIOCALCANEAL FUSION;  Surgeon: [name], MD;  Location: [facility];  Service: Orthopedics;  Laterality: Left;   AV FISTULA PLACEMENT Right 08/20/2018   Procedure: ARTERIOVENOUS (AV) FISTULA CREATION BRACHIOCEPHALIC;  Surgeon: Angelia Mould, MD;  Location: [facility];  Service: Vascular;  Laterality: Right;   BREAST SURGERY Right 03/1997   biospy   CARPAL TUNNEL RELEASE Left 2016   HYSTEROSCOPY WITH D & C N/A 09/29/2020   Procedure: DILATATION AND CURETTAGE /HYSTEROSCOPY;  Surgeon: Mora Bellman, MD;  Location: [facility];  Service: Gynecology;  Laterality: N/A;   INSERTION OF MESH N/A 01/08/2013   Procedure: INSERTION OF MESH;  Surgeon: Earnstine Regal, MD;  Location: [facility];  Service: General;  Laterality: N/A;   IR THORACENTESIS ASP PLEURAL SPACE W/IMG GUIDE  04/06/2017    JOINT REPLACEMENT Right 1999   TOTAL KNEE ARTHROPLASTY Left 1999   Left   TRIGGER FINGER RELEASE Left 11/2014   TRIGGER FINGER RELEASE Right    VENTRAL HERNIA REPAIR N/A 01/08/2013   Procedure: LAPAROSCOPIC VENTRAL HERNIA;  Surgeon: Earnstine Regal, MD;  Location: [facility];  Service: General;  Laterality: N/A;   Social History:  reports that she quit smoking about 40 years ago. Her smoking use included cigarettes. She has never used smokeless tobacco. She reports that she does not drink alcohol and does not use drugs.  Allergies  Allergen Reactions   Morphine Nausea And Vomiting    Family History  Problem Relation Age of Onset   Leukemia Mother    Hypertension Brother    Emphysema Father     Prior to Admission medications   Medication Sig Start Date End Date Taking? Authorizing Provider  acetaminophen (TYLENOL) 500 MG tablet Take 2 tablets (1,000 mg total) by mouth 3 (three) times daily. 01/14/22   Dahal, Marlowe Aschoff, MD  albuterol (VENTOLIN HFA) 108 (90 Base) MCG/ACT inhaler Inhale 2 puffs into the lungs every 6 (six) hours as needed for wheezing or shortness of breath.  03/26/20   [provider]  allopurinol (ZYLOPRIM) 100 MG tablet Take 100 mg by mouth daily. 08/24/21   [provider]  apixaban (ELIQUIS) 2.5 MG TABS tablet Take 2.5 mg by mouth 2 (two) times daily.    [provider]  atorvastatin (LIPITOR) 20 MG tablet Take 20 mg by mouth daily. 08/24/21   [provider]  B-D INS SYR ULTRAFINE 1CC/30G 30G X 1/2" 1 ML MISC 1 each by Other route daily.  12/23/19   [provider]  Blood Glucose Monitoring Suppl ([place]) w/Device KIT  03/23/20   [provider]  buprenorphine (BUTRANS) 7.5 MCG/HR Place 1 patch onto the skin once a week.    [provider]  calcium acetate (PHOSLO) 667 MG tablet Take 667 mg by mouth in the morning, at noon, in the evening, and at bedtime.  11/28/19   [provider]   carbidopa-levodopa (SINEMET IR) 25-100 MG tablet Take 2 tablets by mouth daily.    [provider]  docusate sodium (COLACE) 100 MG capsule Take 200 mg by mouth at bedtime.    [provider]  doxercalciferol (HECTOROL) 4 MCG/2ML injection Inject 1 mL (2 mcg total) into the vein every Monday, Wednesday, and Friday with hemodialysis. 01/27/20   [place] Eric J, DO  fluticasone (FLONASE) 50 MCG/ACT nasal spray Place 2 sprays into both nostrils daily.    [provider]  gabapentin (NEURONTIN) 300 MG capsule Take 300 mg by mouth at bedtime.    [provider]  glucose blood test strip 1 each by Other route  daily.     [provider]  hydrOXYzine (ATARAX) 50 MG tablet Take 50 mg by mouth 2 (two) times daily. HOLD WHEN OUT OF COMMUNITY FOR DIALYSIS TX ON Monday, Wednesday & FRIDAY 08/24/21   [provider]  insulin aspart (NOVOLOG) 100 UNIT/ML injection Inject 0-15 Units into the skin 3 (three) times daily with meals. Patient taking differently: Inject 0-9 Units into the skin 3 (three) times daily with meals. 01/14/22   Dahal, Marlowe Aschoff, MD  insulin aspart (NOVOLOG) 100 UNIT/ML injection Inject 0-5 Units into the skin at bedtime. 01/14/22   Dahal, Marlowe Aschoff, MD  insulin NPH Human (NOVOLIN N) 100 UNIT/ML injection Inject 0.05 mLs (5 Units total) into the skin 2 (two) times daily at 8 am and 10 pm. 01/14/22   Dahal, Marlowe Aschoff, MD  Lancets [facility] DELICA PLUS [id number]) MISC 1 each by Other route daily.  03/05/19   [provider]  loratadine (CLARITIN) 10 MG tablet Take 10 mg by mouth daily.    [provider]  midodrine (PROAMATINE) 10 MG tablet Take 10 mg by mouth See admin instructions. Give 10 mg on Mon, Wed, Fri prior to Dialysis treatment and 1 tablet half way through treatment 08/24/21   [provider]  mirtazapine (REMERON) 15 MG tablet Take 15 mg by mouth at bedtime.    [provider]  Multiple Vitamin (MULTIVITAMIN WITH  MINERALS) TABS tablet Take 1 tablet by mouth daily.    [provider]  Nutritional Supplements (ADULT NUTRITIONAL SUPPLEMENT + PO) Take 1 tablet by mouth in the morning and at bedtime. Wound Healing    [provider]  Nutritional Supplements (FEEDING SUPPLEMENT, NEPRO CARB STEADY,) LIQD Take 237 mLs by mouth 2 (two) times daily between meals. 01/16/22   Terrilee Croak, MD  OXYGEN Inhale 2 L into the lungs as needed (shortness of breath).    [provider]  pantoprazole (PROTONIX) 40 MG tablet Take 40 mg by mouth daily.    [provider]  polyethylene glycol (MIRALAX / GLYCOLAX) 17 g packet Take 17 g by mouth daily as needed for mild constipation.    [provider]  predniSONE (DELTASONE) 2.5 MG tablet Take 2.5 mg by mouth daily. 12/22/21   [provider]  promethazine (PHENERGAN) 12.5 MG tablet Take 12.5 mg by mouth 3 (three) times daily.    [provider]  Ropinirole HCl 12 MG TB24 Take 12 mg by mouth daily.    [provider]  senna-docusate (SENOKOT-S) 8.6-50 MG tablet Take 2 tablets by mouth at bedtime. 01/14/22   Terrilee Croak, MD  sertraline (ZOLOFT) 25 MG tablet Take 25 mg by mouth daily.    [provider]  simethicone (MYLICON) 568 MG chewable tablet Chew 125 mg by mouth every 12 (twelve) hours as needed for flatulence.    [provider]  tamsulosin (FLOMAX) 0.4 MG CAPS capsule Take 1 capsule (0.4 mg total) by mouth daily. 01/16/22   Terrilee Croak, MD    Physical Exam: Vitals:   07/21/22 2220 07/21/22 2230 07/21/22 2245 07/21/22 2248  BP: (!) 129/48 (!) 111/50 109/67   Pulse: 71 65 64   Resp: 17 15 (!) 24   Temp:    98.2 F (36.8 C)  TempSrc:    Oral  SpO2: 99% 93% 92%    Constitutional: NAD, calm, comfortable Eyes: PERRL, lids and conjunctivae normal ENMT: Mucous membranes are moist. Posterior pharynx clear of any exudate or lesions.Normal dentition.  Neck: normal, supple, no  masses, no  thyromegaly Respiratory: clear to auscultation bilaterally, no wheezing, no crackles. Normal respiratory effort. No accessory muscle use.  Cardiovascular: Regular rate and rhythm, no murmurs / rubs / gallops. No extremity edema. 2+ pedal pulses. No carotid bruits.  Abdomen: LLQ TTP Musculoskeletal: no clubbing / cyanosis. No joint deformity upper and lower extremities. Good ROM, no contractures. Normal muscle tone.  Skin: no rashes, lesions, ulcers. No induration Neurologic: CN 2-12 grossly intact. Sensation intact, DTR normal. Strength 5/5 in all 4.  Psychiatric: Normal judgment and insight. Alert and oriented x 3. Normal mood.   Data Reviewed:       Latest Ref Rng & Units 07/21/2022    3:38 PM 01/14/2022    9:45 AM 01/12/2022    3:41 AM  CBC  WBC 4.0 - 10.5 K/uL 11.1  13.3  11.5   Hemoglobin 12.0 - 15.0 g/dL 7.8  10.5  10.7   Hematocrit 36.0 - 46.0 % 26.3  34.8  34.4   Platelets 150 - 400 K/uL 241  249  231       Latest Ref Rng & Units 07/21/2022    3:38 PM 01/14/2022    9:45 AM 01/12/2022    3:41 AM  CMP  Glucose 70 - 99 mg/dL 135  77  119   BUN 8 - 23 mg/dL 9  57  52   Creatinine 0.44 - 1.00 mg/dL 3.43  7.30  6.40   Sodium 135 - 145 mmol/L 138  135  133   Potassium 3.5 - 5.1 mmol/L 3.7  5.0  4.8   Chloride 98 - 111 mmol/L 97  97  93   CO2 22 - 32 mmol/L '30  25  27   ' Calcium 8.9 - 10.3 mg/dL 8.4  8.7  8.8   Total Protein 6.5 - 8.1 g/dL 5.6     Total Bilirubin 0.3 - 1.2 mg/dL 0.5     Alkaline Phos 38 - 126 U/L 102     AST 15 - 41 U/L 9     ALT 0 - 44 U/L <5       CXR neg  CT AP = IMPRESSION: Acute sigmoid diverticulitis with suspected 2.5 cm intramural abscess. No free air.   16 mm hyperdense lesion in the anterior right upper kidney, mildly increased from 2021. Differential considerations include benign hemorrhagic cyst versus small papillary renal cell carcinoma. Consider follow-up MRI abdomen with/without contrast in 6 months.   Additional ancillary findings as  above.  FOBT neg  MRI brain: IMPRESSION: 1. No acute intracranial abnormality. 2. Findings of chronic small vessel ischemia.  Assessment and Plan: * Diverticulitis of intestine with abscess without bleeding Rocephin + Flagyl gen surg will see in consult NPO pending gen surg consult 2.5cm intramural abscess, so likely not candidate for IR drainage. Suspect gen surg will want to try non-invasive management in this chronically ill patient with multiple medical conditions including ESRD. Will hold off on ordering maint IVF given ESRD. Suspect the diverticulitis is responsible for her symptoms of nausea, vomiting, abd pain, and hypotension post dialysis that is worse than her baseline and syncope today. Will keep on tele monitor and order a 2d echo for syncope as well though.  Blurry vision, bilateral MRI brain neg. Progressively worsening for months. Neuro exam otherwise neg. Diabetic retinopathy?  No history of this though, last saw eye doc earlier this year for cataract removal. DM management as below Needs to see ophthalmology, probably f/u as outpt I suspect.  Anemia in chronic kidney disease HGB down since April. Hemoccult neg today Repeat HGB in AM.  ESRD on hemodialysis ([place]) Full treatment of HD yesterday. Call nephro in AM for HD during stay. Cont home midodrine for dialysis associated hypotension.  Kidney lesion Mildly increased in size since 2021. Consider follow up MRI abd in 6 months Though I'm not sure shes a great candidate for surgical intervention anyhow.  Insulin-requiring or dependent type II diabetes mellitus ([place]) Very sensitive SSI Q4H for the moment.      Advance Care Planning:   Code Status: DNR DNI -- MOST FORM; discussed with patient, yellow form from SNF, note that this IS different than what pal care note had last month.  Pt agreed to DNR after I explained that I thought wed would just be breaking her ribs if we got to that stage.  Consults:  EDP spoke with Dr. Zenia Resides  Family Communication: No family in room  Severity of Illness: The appropriate patient status for this patient is OBSERVATION. Observation status is judged to be reasonable and necessary in order to provide the required intensity of service to ensure the patient's safety. The patient's presenting symptoms, physical exam findings, and initial radiographic and laboratory data in the context of their medical condition is felt to place them at decreased risk for further clinical deterioration. Furthermore, it is anticipated that the patient will be medically stable for discharge from the hospital within 2 midnights of admission.   Author: Etta Quill., DO 07/22/2022 12:22 AM  For on call review [email or web address].

## 2022-07-22 NOTE — Assessment & Plan Note (Addendum)
MRI brain neg. Progressively worsening for months. Neuro exam otherwise neg. Diabetic retinopathy?  No history of this though, last saw eye doc earlier this year for cataract removal. 1. DM management as below 2. Needs to see ophthalmology, probably f/u as outpt I suspect.

## 2022-07-22 NOTE — Progress Notes (Signed)
OT Cancellation Note  Patient Details Name: Jean Mcclain MRN: [id number] DOB: 1946/05/01   Cancelled Treatment:    Reason Eval/Treat Not Completed: Patient at procedure or test/ unavailable  Estephan Gallardo D Chani Ghanem 07/22/2022, 3:21 PM 07/22/2022  RP, OTR/L  Acute Rehabilitation Services  Office:  [phone]

## 2022-07-23 DIAGNOSIS — D631 Anemia in chronic kidney disease: Secondary | ICD-10-CM | POA: Diagnosis not present

## 2022-07-23 DIAGNOSIS — N186 End stage renal disease: Secondary | ICD-10-CM | POA: Diagnosis not present

## 2022-07-23 DIAGNOSIS — E119 Type 2 diabetes mellitus without complications: Secondary | ICD-10-CM | POA: Diagnosis not present

## 2022-07-23 DIAGNOSIS — K5792 Diverticulitis of intestine, part unspecified, without perforation or abscess without bleeding: Secondary | ICD-10-CM | POA: Diagnosis not present

## 2022-07-23 LAB — BASIC METABOLIC PANEL
Anion gap: 7 (ref 5–15)
BUN: 6 mg/dL — ABNORMAL LOW (ref 8–23)
CO2: 27 mmol/L (ref 22–32)
Calcium: 7.8 mg/dL — ABNORMAL LOW (ref 8.9–10.3)
Chloride: 99 mmol/L (ref 98–111)
Creatinine, Ser: 2.37 mg/dL — ABNORMAL HIGH (ref 0.44–1.00)
GFR, Estimated: 21 mL/min — ABNORMAL LOW (ref >60)
Glucose, Bld: 121 mg/dL — ABNORMAL HIGH (ref 70–99)
Potassium: 3.3 mmol/L — ABNORMAL LOW (ref 3.5–5.1)
Sodium: 133 mmol/L — ABNORMAL LOW (ref 135–145)

## 2022-07-23 LAB — T4, FREE: Free T4: 0.95 ng/dL (ref 0.61–1.12)

## 2022-07-23 LAB — CBC WITH DIFFERENTIAL/PLATELET
Abs Immature Granulocytes: 0.1 10*3/uL — ABNORMAL HIGH (ref 0.00–0.07)
Basophils Absolute: 0 10*3/uL (ref 0.0–0.1)
Basophils Relative: 0 %
Eosinophils Absolute: 0.2 10*3/uL (ref 0.0–0.5)
Eosinophils Relative: 2 %
HCT: 22.8 % — ABNORMAL LOW (ref 36.0–46.0)
Hemoglobin: 6.8 g/dL — CL (ref 12.0–15.0)
Immature Granulocytes: 1 %
Lymphocytes Relative: 8 %
Lymphs Abs: 0.8 10*3/uL (ref 0.7–4.0)
MCH: 25.6 pg — ABNORMAL LOW (ref 26.0–34.0)
MCHC: 29.8 g/dL — ABNORMAL LOW (ref 30.0–36.0)
MCV: 85.7 fL (ref 80.0–100.0)
Monocytes Absolute: 0.5 10*3/uL (ref 0.1–1.0)
Monocytes Relative: 5 %
Neutro Abs: 8.1 10*3/uL — ABNORMAL HIGH (ref 1.7–7.7)
Neutrophils Relative %: 84 %
Platelets: 200 10*3/uL (ref 150–400)
RBC: 2.66 MIL/uL — ABNORMAL LOW (ref 3.87–5.11)
RDW: 23.9 % — ABNORMAL HIGH (ref 11.5–15.5)
WBC: 9.7 10*3/uL (ref 4.0–10.5)
nRBC: 0 % (ref 0.0–0.2)

## 2022-07-23 LAB — GLUCOSE, CAPILLARY
Glucose-Capillary: 116 mg/dL — ABNORMAL HIGH (ref 70–99)
Glucose-Capillary: 120 mg/dL — ABNORMAL HIGH (ref 70–99)
Glucose-Capillary: 130 mg/dL — ABNORMAL HIGH (ref 70–99)
Glucose-Capillary: 135 mg/dL — ABNORMAL HIGH (ref 70–99)
Glucose-Capillary: 225 mg/dL — ABNORMAL HIGH (ref 70–99)
Glucose-Capillary: 75 mg/dL (ref 70–99)

## 2022-07-23 LAB — HEMOGLOBIN AND HEMATOCRIT, BLOOD
HCT: 27.8 % — ABNORMAL LOW (ref 36.0–46.0)
Hemoglobin: 8.9 g/dL — ABNORMAL LOW (ref 12.0–15.0)

## 2022-07-23 LAB — PREPARE RBC (CROSSMATCH)

## 2022-07-23 LAB — BRAIN NATRIURETIC PEPTIDE: B Natriuretic Peptide: 401.3 pg/mL — ABNORMAL HIGH (ref 0.0–100.0)

## 2022-07-23 LAB — CORTISOL: Cortisol, Plasma: 13 ug/dL

## 2022-07-23 LAB — TSH: TSH: 0.291 u[IU]/mL — ABNORMAL LOW (ref 0.350–4.500)

## 2022-07-23 LAB — DIC (DISSEMINATED INTRAVASCULAR COAGULATION)PANEL
D-Dimer, Quant: 0.59 ug/mL-FEU — ABNORMAL HIGH (ref 0.00–0.50)
Fibrinogen: 416 mg/dL (ref 210–475)
INR: 1.3 — ABNORMAL HIGH (ref 0.8–1.2)
Platelets: 170 10*3/uL (ref 150–400)
Prothrombin Time: 15.8 seconds — ABNORMAL HIGH (ref 11.4–15.2)
Smear Review: NONE SEEN
aPTT: 34 seconds (ref 24–36)

## 2022-07-23 LAB — MAGNESIUM: Magnesium: 1.4 mg/dL — ABNORMAL LOW (ref 1.7–2.4)

## 2022-07-23 LAB — HEPATITIS B SURFACE ANTIBODY, QUANTITATIVE: Hep B S AB Quant (Post): <3.1 m[IU]/mL — ABNORMAL LOW (ref >9.9)

## 2022-07-23 LAB — HAPTOGLOBIN: Haptoglobin: 142 mg/dL (ref 42–346)

## 2022-07-23 MED ORDER — SODIUM CHLORIDE 0.9% IV SOLUTION: Freq: Once | INTRAVENOUS | Status: AC

## 2022-07-23 MED ORDER — MAGNESIUM SULFATE 2 GM/50ML IV SOLN
2.0000 g | Freq: Once | INTRAVENOUS | Status: AC
  Administered 2022-07-23: 2 g via INTRAVENOUS
  Filled 2022-07-23: qty 50

## 2022-07-23 NOTE — Progress Notes (Signed)
Patient ID: Jean Mcclain, female   DOB: May 11, 1946, 76 y.o.   MRN: [id number] [facility] Surgery Progress Note:   * No surgery found *   THE PLAN  MWF hemodialysis Diverticulitis with intramural abscess is responding to IV abx and liquid diet-continue  Subjective: Mental status is clear.  Complaints none except dry mouth. Objective: Vital signs in last 24 hours: Temp:  [97.8 F (36.6 C)-99.2 F (37.3 C)] 98.1 F (36.7 C) (10/14 0737) Pulse Rate:  [50-82] 55 (10/14 0737) Resp:  [11-23] 16 (10/14 0737) BP: (96-118)/(41-100) 107/57 (10/14 0737) SpO2:  [97 %-100 %] 100 % (10/14 0737) Weight:  [66.6 kg-70.2 kg] 70.2 kg (10/13 2242)  Intake/Output from previous day: 10/13 0701 - 10/14 0700 In: 382.2 [IV Piggyback:382.2] Out: 2000  Intake/Output this shift: Total I/O In: 450 [Blood:450] Out: -   Physical Exam: Work of breathing is normal.  No localizing lower abdominal tenderness3  Lab Results:  Results for orders placed or performed during the hospital encounter of 07/21/22 (from the past 48 hour(s))  Comprehensive metabolic panel     Status: Abnormal   Collection Time: 07/21/22  3:38 PM  Result Value Ref Range   Sodium 138 135 - 145 mmol/L   Potassium 3.7 3.5 - 5.1 mmol/L   Chloride 97 (L) 98 - 111 mmol/L   CO2 30 22 - 32 mmol/L   Glucose, Bld 135 (H) 70 - 99 mg/dL    Comment: Glucose reference range applies only to samples taken after fasting for at least 8 hours.   BUN 9 8 - 23 mg/dL   Creatinine, Ser 3.43 (H) 0.44 - 1.00 mg/dL   Calcium 8.4 (L) 8.9 - 10.3 mg/dL   Total Protein 5.6 (L) 6.5 - 8.1 g/dL   Albumin 2.9 (L) 3.5 - 5.0 g/dL   AST 9 (L) 15 - 41 U/L   ALT <5 0 - 44 U/L   Alkaline Phosphatase 102 38 - 126 U/L   Total Bilirubin 0.5 0.3 - 1.2 mg/dL   GFR, Estimated 13 (L) >60 mL/min    Comment: (NOTE) Calculated using the CKD-EPI Creatinine Equation (2021)    Anion gap 11 5 - 15    Comment: Performed at [facility], [place] [address]  [place], [place] [address]  CBC with Differential     Status: Abnormal   Collection Time: 07/21/22  3:38 PM  Result Value Ref Range   WBC 11.1 (H) 4.0 - 10.5 K/uL   RBC 3.01 (L) 3.87 - 5.11 MIL/uL   Hemoglobin 7.8 (L) 12.0 - 15.0 g/dL   HCT 26.3 (L) 36.0 - 46.0 %   MCV 87.4 80.0 - 100.0 fL   MCH 25.9 (L) 26.0 - 34.0 pg   MCHC 29.7 (L) 30.0 - 36.0 g/dL   RDW 24.0 (H) 11.5 - 15.5 %   Platelets 241 150 - 400 K/uL   nRBC 0.2 0.0 - 0.2 %   Neutrophils Relative % 87 %   Neutro Abs 9.7 (H) 1.7 - 7.7 K/uL   Lymphocytes Relative 6 %   Lymphs Abs 0.7 0.7 - 4.0 K/uL   Monocytes Relative 5 %   Monocytes Absolute 0.5 0.1 - 1.0 K/uL   Eosinophils Relative 1 %   Eosinophils Absolute 0.1 0.0 - 0.5 K/uL   Basophils Relative 0 %   Basophils Absolute 0.0 0.0 - 0.1 K/uL   WBC Morphology WHITE COUNT CONFIRMED ON SMEAR    RBC Morphology MORPHOLOGY UNREMARKABLE    Immature  Granulocytes 1 %   Abs Immature Granulocytes 0.13 (H) 0.00 - 0.07 K/uL    Comment: Performed at [facility], [place] [address], [place], [place] [address]  Type and screen [place]     Status: None (Preliminary result)   Collection Time: 07/21/22  6:38 PM  Result Value Ref Range   ABO/RH(D) O POS    Antibody Screen NEG    Sample Expiration 07/24/2022,2359    Unit Number [id number]    Blood Component Type RED CELLS,LR    Unit division 00    Status of Unit ISSUED    Transfusion Status OK TO TRANSFUSE    Crossmatch Result      Compatible Performed at [facility], [place] [address], [place], [place] [address]   TSH     Status: Abnormal   Collection Time: 07/21/22  6:38 PM  Result Value Ref Range   TSH 0.241 (L) 0.350 - 4.500 uIU/mL    Comment: Performed by a 3rd Generation assay with a functional sensitivity of <=0.01 uIU/mL. Performed at [facility], [place] [address] [place], [place] [address]   POC occult blood, ED     Status: None   Collection Time: 07/21/22 10:49 PM  Result Value Ref  Range   Fecal Occult Bld NEGATIVE NEGATIVE  CBG monitoring, ED     Status: None   Collection Time: 07/22/22 12:39 AM  Result Value Ref Range   Glucose-Capillary 97 70 - 99 mg/dL    Comment: Glucose reference range applies only to samples taken after fasting for at least 8 hours.   Comment 1 Notify RN    Comment 2 Document in Chart   CBG monitoring, ED     Status: Abnormal   Collection Time: 07/22/22  4:34 AM  Result Value Ref Range   Glucose-Capillary 104 (H) 70 - 99 mg/dL    Comment: Glucose reference range applies only to samples taken after fasting for at least 8 hours.  CBC     Status: Abnormal   Collection Time: 07/22/22  4:38 AM  Result Value Ref Range   WBC 9.2 4.0 - 10.5 K/uL   RBC 2.76 (L) 3.87 - 5.11 MIL/uL   Hemoglobin 7.1 (L) 12.0 - 15.0 g/dL   HCT 24.0 (L) 36.0 - 46.0 %   MCV 87.0 80.0 - 100.0 fL   MCH 25.7 (L) 26.0 - 34.0 pg   MCHC 29.6 (L) 30.0 - 36.0 g/dL   RDW 24.0 (H) 11.5 - 15.5 %   Platelets 231 150 - 400 K/uL   nRBC 0.0 0.0 - 0.2 %    Comment: Performed at [facility] [address], [place], [place] [address]  Basic metabolic panel     Status: Abnormal   Collection Time: 07/22/22  4:38 AM  Result Value Ref Range   Sodium 138 135 - 145 mmol/L   Potassium 3.7 3.5 - 5.1 mmol/L   Chloride 101 98 - 111 mmol/L   CO2 30 22 - 32 mmol/L   Glucose, Bld 105 (H) 70 - 99 mg/dL    Comment: Glucose reference range applies only to samples taken after fasting for at least 8 hours.   BUN 11 8 - 23 mg/dL   Creatinine, Ser 3.71 (H) 0.44 - 1.00 mg/dL   Calcium 8.0 (L) 8.9 - 10.3 mg/dL   GFR, Estimated 12 (L) >60 mL/min    Comment: (NOTE) Calculated using the CKD-EPI Creatinine Equation (2021)  Anion gap 7 5 - 15    Comment: Performed at [facility] [address] [place], [place] [address]  Hemoglobin A1c     Status: Abnormal   Collection Time: 07/22/22  4:38 AM  Result Value Ref Range   Hgb A1c MFr Bld 6.9 (H) 4.8 - 5.6 %    Comment: (NOTE) Pre  diabetes:          5.7%-6.4%  Diabetes:              >6.4%  Glycemic control for   <7.0% adults with diabetes    Mean Plasma Glucose 151.33 mg/dL    Comment: Performed at [facility] [address] [place], [place] [address]  CBG monitoring, ED     Status: None   Collection Time: 07/22/22  9:00 AM  Result Value Ref Range   Glucose-Capillary 96 70 - 99 mg/dL    Comment: Glucose reference range applies only to samples taken after fasting for at least 8 hours.  CBG monitoring, ED     Status: None   Collection Time: 07/22/22 12:44 PM  Result Value Ref Range   Glucose-Capillary 89 70 - 99 mg/dL    Comment: Glucose reference range applies only to samples taken after fasting for at least 8 hours.  Vitamin B12     Status: None   Collection Time: 07/22/22  5:16 PM  Result Value Ref Range   Vitamin B-12 264 180 - 914 pg/mL    Comment: (NOTE) This assay is not validated for testing neonatal or myeloproliferative syndrome specimens for Vitamin B12 levels. Performed at [facility], [place] [address], [place], [place] [address]   Folate     Status: Abnormal   Collection Time: 07/22/22  5:16 PM  Result Value Ref Range   Folate 3.4 (L) >5.9 ng/mL    Comment: Performed at [facility], [place] [address] [place], [place] [address]  Iron and TIBC     Status: Abnormal   Collection Time: 07/22/22  5:16 PM  Result Value Ref Range   Iron 50 28 - 170 ug/dL   TIBC 130 (L) 250 - 450 ug/dL   Saturation Ratios 38 (H) 10.4 - 31.8 %   UIBC 80 ug/dL    Comment: Performed at [facility] [address], [place], [place] [address]  Ferritin     Status: Abnormal   Collection Time: 07/22/22  5:16 PM  Result Value Ref Range   Ferritin 883 (H) 11 - 307 ng/mL    Comment: Performed at [facility], [place] [address], [place], [place] [address]  Lactate dehydrogenase     Status: None   Collection Time: 07/22/22  5:16 PM  Result Value Ref Range   LDH 136 98 - 192 U/L    Comment:  Performed at [facility], [place] [address] [place], [place] [address]  Haptoglobin     Status: None   Collection Time: 07/22/22  5:16 PM  Result Value Ref Range   Haptoglobin 142 42 - 346 mg/dL    Comment: (NOTE) Performed At: [facility] [place], [place] [id number] Rush Farmer MD [id number]   TSH     Status: Abnormal   Collection Time: 07/22/22  5:16 PM  Result Value Ref Range   TSH 0.212 (L) 0.350 - 4.500 uIU/mL    Comment: Performed by a 3rd Generation assay with a functional sensitivity of <=0.01 uIU/mL.  Performed at [facility], [place] [address], [place], [place] [address]   Cortisol     Status: None   Collection Time: 07/22/22  5:16 PM  Result Value Ref Range   Cortisol, Plasma 12.3 ug/dL    Comment: (NOTE) AM    6.7 - 22.6 ug/dL PM   <10.0       ug/dL Performed at [facility] [address] [place], [place] [address]   Phosphorus     Status: Abnormal   Collection Time: 07/22/22  5:16 PM  Result Value Ref Range   Phosphorus 2.4 (L) 2.5 - 4.6 mg/dL    Comment: Performed at [facility] [address] [place], [place] [address]  Hepatitis B surface antigen     Status: None   Collection Time: 07/22/22  5:16 PM  Result Value Ref Range   Hepatitis B Surface Ag NON REACTIVE NON REACTIVE    Comment: Performed at [facility] [address], [place], [place] [address]  Hepatitis B surface antibody     Status: None   Collection Time: 07/22/22  5:16 PM  Result Value Ref Range   Hep B S Ab NON REACTIVE NON REACTIVE    Comment: (NOTE) Inconsistent with immunity, less than 10 mIU/mL.  Performed at [facility], [place] [address], [place], [place] [address]   Hepatitis B surface antibody,quantitative     Status: Abnormal   Collection Time: 07/22/22  5:16 PM  Result Value Ref Range   Hep B S AB Quant (Post) <3.1 (L) Immunity>9.9 mIU/mL    Comment: (NOTE)  Status of Immunity                     Anti-HBs  Level  ------------------                     -------------- Inconsistent with Immunity                   0.0 - 9.9 Consistent with Immunity                          >9.9 Performed At: [facility] [address] [place], [place] [id number] Rush Farmer MD [id number]   Hepatitis B core antibody, total     Status: None   Collection Time: 07/22/22  5:16 PM  Result Value Ref Range   Hep B Core Total Ab NON REACTIVE NON REACTIVE    Comment: Performed at [facility] [address], [place], [place] [address]  Hepatitis C antibody     Status: None   Collection Time: 07/22/22  5:16 PM  Result Value Ref Range   HCV Ab NON REACTIVE NON REACTIVE    Comment: (NOTE) Nonreactive HCV antibody screen is consistent with no HCV infections,  unless recent infection is suspected or other evidence exists to indicate HCV infection.  Performed at [facility], [place] [address], [place], [place] [address]   Glucose, capillary     Status: Abnormal   Collection Time: 07/22/22  9:10 PM  Result Value Ref Range   Glucose-Capillary 113 (H) 70 - 99 mg/dL    Comment: Glucose reference range applies only to samples taken after fasting for at least 8 hours.  Glucose, capillary     Status: Abnormal   Collection Time: 07/23/22 12:43 AM  Result Value Ref Range   Glucose-Capillary  130 (H) 70 - 99 mg/dL    Comment: Glucose reference range applies only to samples taken after fasting for at least 8 hours.  Magnesium     Status: Abnormal   Collection Time: 07/23/22  2:25 AM  Result Value Ref Range   Magnesium 1.4 (L) 1.7 - 2.4 mg/dL    Comment: Performed at [facility] [address] [place], [place] [address]  CBC with Differential/Platelet     Status: Abnormal   Collection Time: 07/23/22  2:25 AM  Result Value Ref Range   WBC 9.7 4.0 - 10.5 K/uL   RBC 2.66 (L) 3.87 - 5.11 MIL/uL   Hemoglobin 6.8 (LL) 12.0 - 15.0 g/dL    Comment: REPEATED TO VERIFY THIS CRITICAL RESULT HAS  VERIFIED AND BEEN CALLED TO JOSELINE CERENO RN BY BERTRAM TAYLOR ON 10 14 2023 AT 0320, AND HAS BEEN READ BACK.     HCT 22.8 (L) 36.0 - 46.0 %   MCV 85.7 80.0 - 100.0 fL   MCH 25.6 (L) 26.0 - 34.0 pg   MCHC 29.8 (L) 30.0 - 36.0 g/dL   RDW 23.9 (H) 11.5 - 15.5 %   Platelets 200 150 - 400 K/uL   nRBC 0.0 0.0 - 0.2 %   Neutrophils Relative % 84 %   Neutro Abs 8.1 (H) 1.7 - 7.7 K/uL   Lymphocytes Relative 8 %   Lymphs Abs 0.8 0.7 - 4.0 K/uL   Monocytes Relative 5 %   Monocytes Absolute 0.5 0.1 - 1.0 K/uL   Eosinophils Relative 2 %   Eosinophils Absolute 0.2 0.0 - 0.5 K/uL   Basophils Relative 0 %   Basophils Absolute 0.0 0.0 - 0.1 K/uL   Immature Granulocytes 1 %   Abs Immature Granulocytes 0.10 (H) 0.00 - 0.07 K/uL    Comment: Performed at [facility] [address] [place], [place] [address]  Brain natriuretic peptide     Status: Abnormal   Collection Time: 07/23/22  2:25 AM  Result Value Ref Range   B Natriuretic Peptide 401.3 (H) 0.0 - 100.0 pg/mL    Comment: Performed at [facility] [address], [place], [place] [address]  Basic metabolic panel     Status: Abnormal   Collection Time: 07/23/22  2:25 AM  Result Value Ref Range   Sodium 133 (L) 135 - 145 mmol/L   Potassium 3.3 (L) 3.5 - 5.1 mmol/L   Chloride 99 98 - 111 mmol/L   CO2 27 22 - 32 mmol/L   Glucose, Bld 121 (H) 70 - 99 mg/dL    Comment: Glucose reference range applies only to samples taken after fasting for at least 8 hours.   BUN 6 (L) 8 - 23 mg/dL   Creatinine, Ser 2.37 (H) 0.44 - 1.00 mg/dL   Calcium 7.8 (L) 8.9 - 10.3 mg/dL   GFR, Estimated 21 (L) >60 mL/min    Comment: (NOTE) Calculated using the CKD-EPI Creatinine Equation (2021)    Anion gap 7 5 - 15    Comment: Performed at [facility] [address] [place], [place] [address]  Cortisol     Status: None   Collection Time: 07/23/22  2:25 AM  Result Value Ref Range   Cortisol, Plasma 13.0 ug/dL    Comment: (NOTE) AM    6.7  - 22.6 ug/dL PM   <10.0       ug/dL Performed at [facility]  [address] [place], [place] [address]   Prepare RBC (crossmatch)     Status: None   Collection Time: 07/23/22  3:30 AM  Result Value Ref Range   Order Confirmation      ORDER PROCESSED BY BLOOD BANK Performed at [facility], [place] [address], [place], [place] [address]   Glucose, capillary     Status: Abnormal   Collection Time: 07/23/22  4:05 AM  Result Value Ref Range   Glucose-Capillary 120 (H) 70 - 99 mg/dL    Comment: Glucose reference range applies only to samples taken after fasting for at least 8 hours.  Glucose, capillary     Status: Abnormal   Collection Time: 07/23/22  7:57 AM  Result Value Ref Range   Glucose-Capillary 116 (H) 70 - 99 mg/dL    Comment: Glucose reference range applies only to samples taken after fasting for at least 8 hours.   Comment 1 Notify RN     Radiology/Results: VAS [place] LOWER EXTREMITY VENOUS (DVT)  Result Date: 07/22/2022  Lower Venous DVT Study Patient Name:  Jean Mcclain  Date of Exam:   07/22/2022 Medical Rec #: [id number]         Accession #:    [id number] Date of Birth: [birth year]          Patient Gender: F Patient Age:   60 years Exam Location:  [facility] Procedure:      VAS [place] LOWER EXTREMITY VENOUS (DVT) Referring Phys: Deno Etienne [facility] --------------------------------------------------------------------------------  Indications: Swelling.  Risk Factors: LLE DVT 2018. Anticoagulation: Eliquis. Limitations: Poor ultrasound/tissue interface. Comparison Study: Previous exam on 10/10/19 was negative for DVT. Performing Technologist: Rogelia Rohrer RVT, RDMS  Examination Guidelines: A complete evaluation includes B-mode imaging, spectral Doppler, color Doppler, and power Doppler as needed of all accessible portions of each vessel. Bilateral testing is considered an integral part of a complete examination. Limited examinations for reoccurring indications may be  performed as noted. The reflux portion of the exam is performed with the patient in reverse Trendelenburg.  +---------+---------------+---------+-----------+----------+-------------------+ RIGHT    CompressibilityPhasicitySpontaneityPropertiesThrombus Aging      +---------+---------------+---------+-----------+----------+-------------------+ CFV      Full           Yes      Yes                                      +---------+---------------+---------+-----------+----------+-------------------+ SFJ      Full                                                             +---------+---------------+---------+-----------+----------+-------------------+ FV Prox  Full           Yes      Yes                                      +---------+---------------+---------+-----------+----------+-------------------+ FV Mid   Full           Yes      Yes                                      +---------+---------------+---------+-----------+----------+-------------------+  FV DistalFull           Yes      Yes                                      +---------+---------------+---------+-----------+----------+-------------------+ PFV      Full                                                             +---------+---------------+---------+-----------+----------+-------------------+ POP      Full           Yes      Yes                                      +---------+---------------+---------+-----------+----------+-------------------+ PTV                                                   Not well visualized +---------+---------------+---------+-----------+----------+-------------------+ PERO                                                  Not well visualized +---------+---------------+---------+-----------+----------+-------------------+   +---------+---------------+---------+-----------+----------+-------------------+ LEFT      CompressibilityPhasicitySpontaneityPropertiesThrombus Aging      +---------+---------------+---------+-----------+----------+-------------------+ CFV      Full           Yes      Yes                                      +---------+---------------+---------+-----------+----------+-------------------+ SFJ      Full                                                             +---------+---------------+---------+-----------+----------+-------------------+ FV Prox  Full           Yes      Yes                                      +---------+---------------+---------+-----------+----------+-------------------+ FV Mid   Full           Yes      Yes                                      +---------+---------------+---------+-----------+----------+-------------------+ FV DistalFull           Yes      Yes                                      +---------+---------------+---------+-----------+----------+-------------------+  PFV      Full                                                             +---------+---------------+---------+-----------+----------+-------------------+ POP      Full           Yes      Yes                                      +---------+---------------+---------+-----------+----------+-------------------+ PTV      Full                                         Not well visualized +---------+---------------+---------+-----------+----------+-------------------+ PERO     Full                                         Not well visualized +---------+---------------+---------+-----------+----------+-------------------+     Summary: BILATERAL: - No evidence of deep vein thrombosis seen in the lower extremities, bilaterally. -No evidence of popliteal cyst, bilaterally.   *See table(s) above for measurements and observations. Electronically signed by Monica Martinez MD on 07/22/2022 at 1:40:37 PM.    Final    ECHOCARDIOGRAM COMPLETE  Result Date:  07/22/2022    ECHOCARDIOGRAM REPORT   Patient Name:   Jean Mcclain Date of Exam: 07/22/2022 Medical Rec #:  [id number]        Height:       60.0 in Accession #:    [id number]       Weight:       163.0 lb Date of Birth:  May 26, 1946         BSA:          35.711 m Patient Age:    40 years         BP:           120/49 mmHg Patient Gender: F                HR:           66 bpm. Exam Location:  Inpatient Procedure: 2D Echo, Cardiac Doppler and Color Doppler Indications:    Syncope R55  History:        Patient has prior history of Echocardiogram examinations, most                 recent 03/11/2017. Risk Factors:Hypertension, Sleep Apnea,                 Diabetes and Dyslipidemia.  Sonographer:    Ronny Flurry Referring Phys: [place]  1. Left ventricular ejection fraction, by estimation, is 60 to 65%. The left ventricle has normal function. The left ventricle has no regional wall motion abnormalities. There is mild left ventricular hypertrophy. Left ventricular diastolic parameters are consistent with Grade I diastolic dysfunction (impaired relaxation).  2. Right ventricular systolic function is normal. The right ventricular size is normal. Tricuspid regurgitation signal is inadequate for assessing PA pressure.  3. Left atrial size was moderately  dilated.  4. Right atrial size was mildly dilated.  5. Trivial mitral valve regurgitation.  6. There is moderate calcification of the aortic valve. Aortic valve regurgitation is not visualized. Mild aortic valve stenosis.  7. The inferior vena cava is normal in size with greater than 50% respiratory variability, suggesting right atrial pressure of 3 mmHg. FINDINGS  Left Ventricle: Left ventricular ejection fraction, by estimation, is 60 to 65%. The left ventricle has normal function. The left ventricle has no regional wall motion abnormalities. The left ventricular internal cavity size was normal in size. There is  mild left ventricular hypertrophy.  Left ventricular diastolic parameters are consistent with Grade I diastolic dysfunction (impaired relaxation). Right Ventricle: The right ventricular size is normal. No increase in right ventricular wall thickness. Right ventricular systolic function is normal. Tricuspid regurgitation signal is inadequate for assessing PA pressure. Left Atrium: Left atrial size was moderately dilated. Right Atrium: Right atrial size was mildly dilated. Pericardium: There is no evidence of pericardial effusion. Mitral Valve: There is severe calcification of the mitral valve leaflet(s). Trivial mitral valve regurgitation. MV peak gradient, 9.7 mmHg. The mean mitral valve gradient is 4.5 mmHg. Tricuspid Valve: The tricuspid valve is normal in structure. Tricuspid valve regurgitation is not demonstrated. Aortic Valve: There is moderate calcification of the aortic valve. Aortic valve regurgitation is not visualized. Mild aortic stenosis is present. Aortic valve mean gradient measures 11.0 mmHg. Aortic valve peak gradient measures 18.5 mmHg. Aortic valve area, by VTI measures 2.14 cm. Pulmonic Valve: The pulmonic valve was grossly normal. Pulmonic valve regurgitation is not visualized. Aorta: The aortic root and ascending aorta are structurally normal, with no evidence of dilitation. Venous: The inferior vena cava is normal in size with greater than 50% respiratory variability, suggesting right atrial pressure of 3 mmHg. IAS/Shunts: No atrial level shunt detected by color flow Doppler.  LEFT VENTRICLE PLAX 2D LVIDd:         3.80 cm   Diastology LVIDs:         2.60 cm   LV e' medial:    6.09 cm/s LV PW:         1.10 cm   LV E/e' medial:  20.2 LV IVS:        1.20 cm   LV e' lateral:   6.64 cm/s LVOT diam:     2.10 cm   LV E/e' lateral: 18.5 LV SV:         113 LV SV Index:   66 LVOT Area:     3.46 cm  RIGHT VENTRICLE             IVC RV S prime:     11.00 cm/s  IVC diam: 1.60 cm TAPSE (M-mode): 2.4 cm LEFT ATRIUM             Index         RIGHT ATRIUM           Index LA diam:        4.00 cm 2.34 cm/m   RA Area:     10.70 cm LA Vol (A2C):   84.2 ml 49.21 ml/m  RA Volume:   20.60 ml  12.04 ml/m LA Vol (A4C):   51.9 ml 30.33 ml/m LA Biplane Vol: 72.5 ml 42.37 ml/m  AORTIC VALVE AV Area (Vmax):    1.85 cm AV Area (Vmean):   1.99 cm AV Area (VTI):     2.14 cm AV Vmax:  215.00 cm/s AV Vmean:          160.000 cm/s AV VTI:            0.527 m AV Peak Grad:      18.5 mmHg AV Mean Grad:      11.0 mmHg LVOT Vmax:         115.00 cm/s LVOT Vmean:        91.900 cm/s LVOT VTI:          0.326 m LVOT/AV VTI ratio: 0.62  AORTA Ao Root diam: 3.20 cm Ao Asc diam:  3.30 cm MITRAL VALVE MV Area (PHT): 2.73 cm     SHUNTS MV Area VTI:   2.06 cm     Systemic VTI:  0.33 m MV Peak grad:  9.7 mmHg     Systemic Diam: 2.10 cm MV Mean grad:  4.5 mmHg MV Vmax:       1.56 m/s MV Vmean:      99.9 cm/s MV Decel Time: 278 msec MR Peak grad: 63.5 mmHg MR Vmax:      398.50 cm/s MV E velocity: 123.00 cm/s MV A velocity: 143.00 cm/s MV E/A ratio:  0.86 Landscape architect signed by Phineas Inches Signature Date/Time: 07/22/2022/10:32:06 AM    Final    CT ABDOMEN PELVIS W CONTRAST  Result Date: 07/21/2022 CLINICAL DATA:  Abdominal pain, nausea/vomiting EXAM: CT ABDOMEN AND PELVIS WITH CONTRAST TECHNIQUE: Multidetector CT imaging of the abdomen and pelvis was performed using the standard protocol following bolus administration of intravenous contrast. RADIATION DOSE REDUCTION: This exam was performed according to the departmental dose-optimization program which includes automated exposure control, adjustment of the mA and/or kV according to patient size and/or use of iterative reconstruction technique. CONTRAST:  51m OMNIPAQUE IOHEXOL 350 MG/ML SOLN COMPARISON:  01/09/2022 and 07/28/2020 FINDINGS: Lower chest: Lung bases are clear. Hepatobiliary: Liver is within normal limits. Gallbladder is unremarkable. No intrahepatic or extrahepatic duct dilatation. Pancreas:  Within normal limits. Spleen: Within normal limits. Adrenals/Urinary Tract: Right adrenal gland is within normal limits. At least 2 left adrenal nodules measuring up to 10 mm (series 3/image 22), unchanged from 2021, favoring benign adrenal adenomas. Left renal cortical atrophy. 10 mm right lower pole simple cyst (series 3/image 38), benign (Bosniak I). However, there is a 16 mm hyperdense lesion in the anterior right upper kidney (series 3/image 32), similar to the most recent prior but previously measuring 9 mm in 2021. Differential considerations include benign hemorrhagic cyst versus small papillary renal cell carcinoma. No hydronephrosis. Mildly thick-walled bladder, although underdistended. Stomach/Bowel: Stomach is within normal limits. No evidence of bowel obstruction. Normal appendix (series 3/image 60). Sigmoid diverticulosis with mild pericolonic inflammatory changes in the left lower quadrant, suggesting sigmoid diverticulitis, and a suspected 1.5 x 2.5 cm intramural abscess (series 3/image 61). No free air. Vascular/Lymphatic: No evidence of abdominal aortic aneurysm. Atherosclerotic calcifications of the abdominal aorta and branch vessels. No suspicious abdominopelvic lymphadenopathy. Reproductive: Uterus is within normal limits. Bilateral ovaries are grossly unremarkable. Other: No abdominopelvic ascites. Musculoskeletal: Degenerative changes of the visualized thoracolumbar spine. IMPRESSION: Acute sigmoid diverticulitis with suspected 2.5 cm intramural abscess. No free air. 16 mm hyperdense lesion in the anterior right upper kidney, mildly increased from 2021. Differential considerations include benign hemorrhagic cyst versus small papillary renal cell carcinoma. Consider follow-up MRI abdomen with/without contrast in 6 months. Additional ancillary findings as above. Electronically Signed   By: SJulian HyM.D.   On: 07/21/2022 23:43   MR BRAIN WO  CONTRAST  Result Date: 07/21/2022 CLINICAL  DATA:  [place] EXAM: MRI HEAD WITHOUT CONTRAST TECHNIQUE: Multiplanar, multiecho pulse sequences of the brain and surrounding structures were obtained without intravenous contrast. COMPARISON:  None Available. FINDINGS: Brain: No acute infarct, mass effect or extra-axial collection. No acute or chronic hemorrhage. There is multifocal hyperintense T2-weighted signal within the white matter. Parenchymal volume and CSF spaces are normal. The midline structures are normal. Vascular: Major flow voids are preserved. Skull and upper cervical spine: Normal calvarium and skull base. Visualized upper cervical spine and soft tissues are normal. Sinuses/Orbits:No paranasal sinus fluid levels or advanced mucosal thickening. No mastoid or middle ear effusion. Normal orbits. IMPRESSION: 1. No acute intracranial abnormality. 2. Findings of chronic small vessel ischemia. Electronically Signed   By: Ulyses Jarred M.D.   On: 07/21/2022 22:13   DG Chest Portable 1 View  Result Date: 07/21/2022 CLINICAL DATA:  Hypoxia, hypertension EXAM: PORTABLE CHEST 1 VIEW COMPARISON:  Chest 01/09/2022 FINDINGS: Heart size upper normal. Vascularity normal. Lungs clear without infiltrate or effusion. Bilateral shoulder subluxation appears chronic and unchanged. IMPRESSION: No active disease. Electronically Signed   By: Franchot Gallo M.D.   On: 07/21/2022 19:07    Anti-infectives: Anti-infectives (From admission, onward)    Start     Dose/Rate Route Frequency Ordered Stop   07/22/22 0015  cefTRIAXone (ROCEPHIN) 2 g in sodium chloride 0.9 % 100 mL IVPB        2 g 200 mL/hr over 30 Minutes Intravenous Daily at 10 pm 07/22/22 0006     07/22/22 0015  metroNIDAZOLE (FLAGYL) IVPB 500 mg        500 mg 100 mL/hr over 60 Minutes Intravenous 2 times daily 07/22/22 0006     07/22/22 0000  piperacillin-tazobactam (ZOSYN) IVPB 3.375 g  Status:  Discontinued        3.375 g 100 mL/hr over 30 Minutes Intravenous  Once 07/21/22 2349 07/22/22 0006        Assessment/Plan: Problem List: Patient Active Problem List   Diagnosis Date Noted   Blurry vision, bilateral 07/22/2022   Diverticulitis of intestine with abscess without bleeding 07/22/2022   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 06/22/2022   Other constipation 02/12/2022   Obstipation 02/12/2022   Pressure injury of back, stage 1 01/16/2022   Thoracic spine fracture ([place]) 01/09/2022   Hyperkalemia 01/09/2022   Anxiety 01/09/2022   History of DVT (deep vein thrombosis) 01/09/2022   Gout 01/09/2022   Postmenopausal vaginal bleeding    Acute blood loss anemia 06/25/2020   Acute on chronic respiratory failure with hypoxia and hypercapnia ([place]) 06/25/2020   Advanced care planning/counseling discussion    Goals of care, counseling/discussion    Obstructive sleep apnea    Primary osteoarthritis, left ankle and foot    Ankle fracture 06/14/2020   HLD (hyperlipidemia) 06/14/2020   Dyspnea, unspecified 02/05/2020   Intractable pain 01/24/2020   Bilateral shoulder pain 01/23/2020   Right knee pain 01/23/2020   Uncontrolled type 2 diabetes mellitus with hyperglycemia, with long-term current use of insulin ([place]) 01/23/2020   Pressure injury of skin 01/22/2020   Mild protein-calorie malnutrition ([place]) 12/16/2019   Personal history of anaphylaxis 12/05/2019   Pruritus, unspecified 12/05/2019   ESRD on hemodialysis ([place]) 11/20/2019   Myoclonus, segmental 11/20/2019   Allergy, unspecified, initial encounter 07/29/2019   Anemia in chronic kidney disease 07/15/2019   Coagulation defect, unspecified ([place]) 07/13/2019   Iron deficiency anemia, unspecified 07/11/2019   Encounter for immunization 07/06/2019  End stage renal disease ([place]) 07/02/2019   GERD (gastroesophageal reflux disease) 07/02/2019   Low back pain 07/02/2019   BMI 34.0-34.9,adult 07/02/2019   Other specified disorders of bone density and structure, unspecified site 07/02/2019   Secondary hyperparathyroidism of renal  origin ([place]) 07/02/2019   Unspecified osteoarthritis, unspecified site 07/02/2019   OSA (obstructive sleep apnea) 03/13/2019   Chronic acquired lymphedema 11/05/2018   Kidney lesion 03/30/2017   Insulin-requiring or dependent type II diabetes mellitus ([place]) 03/11/2017   Essential hypertension 03/11/2017   Impingement syndrome of left ankle 01/31/2017   Posterior tibial tendinitis, left leg 01/31/2017   Incarcerated ventral hernia 01/02/2013    Improving with conservative management.   * No surgery found *    LOS: 1 day   Matt B. Hassell Done, MD, [facility] [phone] to reach the surgeon on call.    07/23/2022 9:58 AM

## 2022-07-23 NOTE — TOC Initial Note (Signed)
Transition of Care [facility]) - Initial/Assessment Note    Patient Details  Name: Jean Mcclain MRN: [id number] Date of Birth: [birth year]  [facility]) CM/SW Contact:    Milinda Antis, Brenda Phone Number: 07/23/2022, 2:43 PM  Clinical Narrative:                 CSW received consult for possible SNF placement at time of discharge. CSW spoke with patient. Patient informed CSW that patient is from [facility].   Patient expressed understanding of PT recommendation and is agreeable to SNF placement at time of discharge only if her ALF is unable to provide the rehabilitation and level of care needed. Patient did not report a preference, but would like a list of facilities who an accept should she not be able to return to the ALF. CSW discussed insurance authorization process and will provide Medicare SNF ratings list. CSW will send out referrals for review and provide bed offers as available.   14:40-  CSW contacted [facility] to inquire about the possibility of the patient returning.  There was no answer.  CSW was unable to leave a VM.    Skilled Nursing Rehab Facilities-   [email or web address]   Ratings out of 5 stars (the highest)   Name Address  Phone # [place] Inspection Overall  [facility] [address] [place] '5 5 2 4  '$ [facility]  [address] [place], [place] [phone] '4 2 5 5  '$ [facility] [place], [place] '1 1 1 1  '$ [place], [place] '2 2 4 4  '$ [facility] [address], [place] '1 1 2 1  '$ [place]. [place] '3 2 4 4  '$ [facility] [address] [place] '5 1 3 3  '$ [facility] [address], [place] '5 2 3 4  '$ [address] ([place]) [place], [place] [phone] '4 1 2 1   '$ [facility] [address], [place] [phone] '4 1 1 1  '$ [facility] [address], [facility] [phone] '3 1 2 1  '$ [facility] ([place]) [place]. [place], [place] [phone] '4 1 1 1  '$ [facility] [address] [place] [phone] '4 2 4 4          '$ [place]      [facility] [place] '4 1 3 2  '$ [facility] [place] [address] [place] '3 1 5 4  '$ [facility], [address], [place] [phone] '1 1 1 1  '$ [facility] [address], [address] [phone] '2 2 3 3          '$ [address] (no [facility]) [place] [address], [place] [phone] '5 5 5 5  '$ [facility] (No Humana) [address] [phone] [address], [place] '4 1 4 3  '$ [facility] (No [employer]HC) [place], [place] [phone] '5 5 5 5  '$ [facility] [address], [address] [phone] '2 3 5 5  '$ [place] [address], [place] '1 1 2 1  '$ [facility] [address], [address] [place] '3 1 1 1  '$ [phone] [place], [place] '5 2 4 5  '$ [facility]  [address], [place] '2 2 1 1  '$ [place] [address] [place] '3 2 1 1  '$ [facility] [place], [place] '2 2 2 2          '$ [facility] [address] [place] '1 1 1 1  '$ [place] [address], [place]  [phone] '2 4 2 2  '$ [facility] [address], [place] [phone] '4 2 3 3  '$ [place] [address], [place] '2 1 1 1  '$ [place] (No Humana) [address], [place] '2 2 3 3  '$ [facility] [address] [place] [phone] '2 1 1 1          '$ [facility] [place], [place] '5 4 5 5  '$ [facility])  [address], [place] '2 1 2 1  '$ [facility]) [place], [place] '3 1 4 3  '$ [facility] [address] [place] '3 3 4 4  '$ [address] [place], [place] '2 3 1 1  '$ [facility]) [address] [place] [phone] '2 1 4 3     '$ Expected Discharge Plan: [place] Barriers to Discharge: Continued Medical Work up   Patient Goals and CMS Choice Patient states their goals for this hospitalization and ongoing recovery are:: To return to ALF CMS Medicare.gov Compare Post Acute Care list provided to:: Patient Choice offered to / list presented to : Patient  Expected Discharge Plan and Services Expected Discharge Plan: [place]       Living arrangements for the past 2 months: [place]                                      Prior Living Arrangements/Services Living arrangements for the past 2 months: [place] Lives with:: Facility Resident Patient language and need for interpreter reviewed:: Yes Do you feel safe going back to the place where you live?: Yes      Need for Family Participation in Patient Care: No (Comment) Care giver support system in place?: No (comment)   Criminal Activity/Legal Involvement Pertinent to Current Situation/Hospitalization: No - Comment as needed  Activities of Daily Living Home Assistive Devices/Equipment: Bedside commode/3-in-1 ADL Screening (condition at time of admission) Patient's cognitive ability adequate to safely complete daily activities?: Yes Is the patient deaf or have difficulty hearing?: Yes Does the patient have difficulty seeing, even when wearing glasses/contacts?: No Does the patient have difficulty concentrating, remembering, or making decisions?: No Patient able to express need for assistance with ADLs?: Yes Does the patient have difficulty dressing or bathing?:  Yes Independently performs ADLs?: No Communication: Independent Dressing (OT): Needs assistance Is this a change from baseline?: Pre-admission baseline Grooming: Needs assistance Is this a change from baseline?: Pre-admission baseline Feeding: Needs assistance Is this a change from baseline?: Pre-admission baseline Bathing: Needs assistance Is this a change from baseline?: Pre-admission baseline Toileting: Needs assistance Is this a change from baseline?: Pre-admission baseline In/Out Bed: Needs assistance Is this a change from baseline?: Pre-admission baseline Walks in Home: Needs assistance Is this a change from baseline?: Pre-admission baseline Does the patient have difficulty walking or climbing stairs?: Yes Weakness of Legs: Both Weakness of Arms/Hands: Both  Permission Sought/Granted Permission sought to share information with : Other (comment) (CSW) Permission granted to  share information with : Yes, Verbal Permission Granted     Permission granted to share info w AGENCY: SNFs and [facility]        Emotional Assessment   Attitude/Demeanor/Rapport: Engaged Affect (typically observed): Accepting Orientation: : Oriented to Situation, Oriented to  Time, Oriented to Place, Oriented to Self Alcohol / Substance Use: Not Applicable Psych Involvement: No (comment)  Admission diagnosis:  Acute diverticulitis [K57.92] Diverticulitis of intestine with abscess without bleeding [K57.80] Syncope, unspecified syncope type [R55] Patient Active Problem List   Diagnosis Date Noted   Blurry vision, bilateral 07/22/2022   Diverticulitis of intestine with abscess without bleeding 07/22/2022   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 06/22/2022   Other constipation 02/12/2022   Obstipation 02/12/2022   Pressure injury of back, stage 1 01/16/2022   Thoracic spine fracture ([place]) 01/09/2022   Hyperkalemia 01/09/2022   Anxiety 01/09/2022   History of DVT (deep vein thrombosis)  01/09/2022   Gout 01/09/2022   Postmenopausal vaginal bleeding    Acute blood loss anemia 06/25/2020   Acute on chronic respiratory failure with hypoxia and hypercapnia (HCC) 06/25/2020   Advanced care planning/counseling discussion    Goals of care, counseling/discussion    Obstructive sleep apnea    Primary osteoarthritis, left ankle and foot    Ankle fracture 06/14/2020   HLD (hyperlipidemia) 06/14/2020   Dyspnea, unspecified 02/05/2020   Intractable pain 01/24/2020   Bilateral shoulder pain 01/23/2020   Right knee pain 01/23/2020   Uncontrolled type 2 diabetes mellitus with hyperglycemia, with long-term current use of insulin ([place]) 01/23/2020   Pressure injury of skin 01/22/2020   Mild protein-calorie malnutrition ([employer]) 12/16/2019   Personal history of anaphylaxis 12/05/2019   Pruritus, unspecified 12/05/2019   ESRD on hemodialysis ([place]) 11/20/2019   Myoclonus, segmental 11/20/2019   Allergy, unspecified, initial encounter 07/29/2019   Anemia in chronic kidney disease 07/15/2019   Coagulation defect, unspecified ([place]) 07/13/2019   Iron deficiency anemia, unspecified 07/11/2019   Encounter for immunization 07/06/2019   End stage renal disease ([place]) 07/02/2019   GERD (gastroesophageal reflux disease) 07/02/2019   Low back pain 07/02/2019   BMI 34.0-34.9,adult 07/02/2019   Other specified disorders of bone density and structure, unspecified site 07/02/2019   Secondary hyperparathyroidism of renal origin ([place]) 07/02/2019   Unspecified osteoarthritis, unspecified site 07/02/2019   OSA (obstructive sleep apnea) 03/13/2019   Chronic acquired lymphedema 11/05/2018   Kidney lesion 03/30/2017   Insulin-requiring or dependent type II diabetes mellitus ([place]) 03/11/2017   Essential hypertension 03/11/2017   Impingement syndrome of left ankle 01/31/2017   Posterior tibial tendinitis, left leg 01/31/2017   Incarcerated ventral hernia 01/02/2013   PCP:  Pcp, No Pharmacy:   [place], [place] - [address] [place] [address] Phone: [phone] Fax: [place] [phone] [place], [place] - [address] [place] [address] Phone: [phone] Fax: [phone]  [facility] [place], [place] - [address] [place], [place] [address] Phone: [phone] Fax: [phone]  [place], [place] [address] [place] [address] Phone: [phone] Fax: [phone]     Social Determinants of Health ([place]) Interventions    Readmission Risk Interventions    01/25/2020    8:31 AM  Readmission Risk Prevention Plan  Transportation Screening Complete  PCP or Specialist  Appt within 5-7 Days Not Complete  Not Complete comments d/c to SNF  Home Care Screening Complete  Medication Review (RN CM) Referral to Pharmacy

## 2022-07-23 NOTE — Progress Notes (Signed)
Came from HD, alert and oriented, no complained of pain. She said at the SNF she can do pivot transfer or sometime they used hoyer lift for her. Used left ankle brace due to her fracture. Having dry heaving, zofran IV given. She was sleep apnea she don't use CPAP, she prefer oxygen.

## 2022-07-23 NOTE — Evaluation (Signed)
Occupational Therapy Evaluation Patient Details Name: Jean Mcclain MRN: [id number] DOB: 1945-10-25 Today's Date: 07/23/2022   History of Present Illness 76 y/o female presented to ED on 07/21/22 for blurry vision x 2 weeks and hypotension. Found to have diverticulitis with abscess. PMH: ESRD on HD, T2DM, hx of DVT, Parkinson's   Clinical Impression   Patient admitted for the diagnosis above on 10/12.  Resides at a local ALF, and requires assist for bathing and dressing, currently working with [facility] PT on transfers and walking.  No OT needed in the acute setting as she is close to baseline.  Recommend up for meals and return to PLOF with supports as before.        Recommendations for follow up therapy are one component of a multi-disciplinary discharge planning process, led by the attending physician.  Recommendations may be updated based on patient status, additional functional criteria and insurance authorization.   Follow Up Recommendations  Other (comment) (Return to Prior Level, ALF)    Assistance Recommended at Discharge Frequent or constant Supervision/Assistance  Patient can return home with the following A lot of help with bathing/dressing/bathroom;A lot of help with walking and/or transfers;Assist for transportation;Assistance with cooking/housework;Direct supervision/assist for medications management    Functional Status Assessment  Patient has not had a recent decline in their functional status  Equipment Recommendations  None recommended by OT    Recommendations for Other Services       Precautions / Restrictions Precautions Precautions: Fall Precaution Comments: L LE brace required for mobility Restrictions Weight Bearing Restrictions: No      Mobility Bed Mobility Overal bed mobility: Needs Assistance Bed Mobility: Supine to Sit     Supine to sit: Mod assist     General bed mobility comments: assist for trunk elevation and repositioning hips towards EOB     Transfers Overall transfer level: Needs assistance Equipment used: Rolling walker (2 wheels) Transfers: Sit to/from Stand, Bed to chair/wheelchair/BSC Sit to Stand: Min assist     Step pivot transfers: Min assist            Balance Overall balance assessment: Needs assistance Sitting-balance support: No upper extremity supported Sitting balance-Leahy Scale: Fair     Standing balance support: Bilateral upper extremity supported, Reliant on assistive device for balance Standing balance-Leahy Scale: Poor                             ADL either performed or assessed with clinical judgement   ADL Overall ADL's : At baseline                                       General ADL Comments: assist as needed     Vision Patient Visual Report: No change from baseline       Perception Perception Perception: Within Functional Limits   Praxis Praxis Praxis: Intact    Pertinent Vitals/Pain Pain Assessment Pain Assessment: No/denies pain Pain Intervention(s): Monitored during session     Hand Dominance Right   Extremity/Trunk Assessment Upper Extremity Assessment Upper Extremity Assessment: LUE deficits/detail LUE Deficits / Details: Shoulder dysfunction LUE Sensation: WNL LUE Coordination: WNL   Lower Extremity Assessment Lower Extremity Assessment: Defer to PT evaluation   Cervical / Trunk Assessment Cervical / Trunk Assessment: Kyphotic   Communication Communication Communication: No difficulties   Cognition Arousal/Alertness: Awake/alert Behavior During Therapy:  WFL for tasks assessed/performed Overall Cognitive Status: No family/caregiver present to determine baseline cognitive functioning                                 General Comments: seems at baseline cognitively. Overall WFL     General Comments  VSS on RA    Exercises     Shoulder Instructions      Home Living Family/patient expects to be discharged  to:: Assisted living                             Home Equipment: Rolling Walker (2 wheels);Wheelchair - power          Prior Functioning/Environment Prior Level of Function : Needs assist             Mobility Comments: performing step pivot transfer with assistance from staff ADLs Comments: pt requires assistance with bathing, dressing, toileting        OT Problem List: Decreased activity tolerance;Impaired balance (sitting and/or standing)      OT Treatment/Interventions:      OT Goals(Current goals can be found in the care plan section) Acute Rehab OT Goals Patient Stated Goal: Return to ALF OT Goal Formulation: With patient Time For Goal Achievement: 07/29/22 Potential to Achieve Goals: Good  OT Frequency:      Co-evaluation              AM-PAC OT "6 Clicks" Daily Activity     Outcome Measure Help from another person eating meals?: None Help from another person taking care of personal grooming?: A Little Help from another person toileting, which includes using toliet, bedpan, or urinal?: A Lot Help from another person bathing (including washing, rinsing, drying)?: A Lot Help from another person to put on and taking off regular upper body clothing?: A Little Help from another person to put on and taking off regular lower body clothing?: A Lot 6 Click Score: 16   End of Session Equipment Utilized During Treatment: Rolling walker (2 wheels) Nurse Communication: Mobility status  Activity Tolerance: Patient tolerated treatment well Patient left: in chair;with call bell/phone within reach  OT Visit Diagnosis: Unsteadiness on feet (R26.81);Muscle weakness (generalized) (M62.81)                Time: 9179-1505 OT Time Calculation (min): 25 min Charges:  OT General Charges $OT Visit: 1 Visit OT Evaluation $OT Eval Moderate Complexity: 1 Mod  07/23/2022  RP, OTR/L  Acute Rehabilitation Services  Office:  [phone]   Metta Clines 07/23/2022, 11:17 AM

## 2022-07-23 NOTE — Progress Notes (Signed)
Subjective: Sitting up in recliner, pain improving, said tolerated dialysis yesterday  Objective Vital signs in last 24 hours: Vitals:   07/22/22 2242 07/23/22 0400 07/23/22 0415 07/23/22 0737  BP:  (!) 96/54 (!) 104/41 (!) 107/57  Pulse:  (!) 51 (!) 50 (!) 55  Resp:  '18 16 16  '$ Temp:  97.8 F (36.6 C) 98.2 F (36.8 C) 98.1 F (36.7 C)  TempSrc:  Oral  Oral  SpO2:  100%  100%  Weight: 70.2 kg     Height: 5' (1.524 m)      Weight change:   Physical Exam: General: Pleasant, appropriate elderly female NAD Heart: RRR, no MRG Lungs: CTA bilaterally nonlabored breathing Abdomen: NABS, soft NTND no ascites Extremities: Trace bipedal edema Dialysis Access: RUA AVF positive bruit  Home meds include - albuterol, apixaban, atorvastatin, buprenorphine, carbidopa-levodopa, gabapentin, insulin aspart/ NPH, midodrine 20 pre hd mwf, mirtazapine, pantoprazole, ropinirole, tamsulosin, calcium acetate 1 ac tid, prns/ vits/ supps    OP HD: MWF [facility]  3.5h  400/1.5  70kg   2/2 bath  Hep none  RUA AVF - last HD 10/11 post wt 71.1kg - last Hb 7.3 - mircera 225 ug q 2, last 10/9 - calcitriol 0.5 ug po tiw mwf     CXR - no active disease    Problem/Plan: Diverticulitis with small abscess= surgery consulted noted this a.m. she is responding to IV antibiotics and liquid diet ESRD -HD M WF on schedule yesterday HTN/volume = history of chronic hypotension on midodrine 20 mg predialysis , x-ray no active disease  Anemia of chronic disease/acute illness= Hgb-6.8, saturation right 38%, for 1 unit P RBC today continue high-dose ESA next due 10/23/transfuse  prn Hgb less than 7 Secondary hyperparathyroidism - CCa  in range, phosphorus 2.4 hold binder (calcium acetate ) follow-up trend IDDM= per admit team   Jean Haber, PA-C [facility] Beeper [phone] 07/23/2022,12:35 PM  LOS: 1 day   Labs: Basic Metabolic Panel: Recent Labs  Lab 07/21/22 1538 07/22/22 0438 07/22/22 1716  07/23/22 0225  NA 138 138  --  133*  K 3.7 3.7  --  3.3*  CL 97* 101  --  99  CO2 30 30  --  27  GLUCOSE 135* 105*  --  121*  BUN 9 11  --  6*  CREATININE 3.43* 3.71*  --  2.37*  CALCIUM 8.4* 8.0*  --  7.8*  PHOS  --   --  2.4*  --    Liver Function Tests: Recent Labs  Lab 07/21/22 1538  AST 9*  ALT <5  ALKPHOS 102  BILITOT 0.5  PROT 5.6*  ALBUMIN 2.9*   No results for input(s): "LIPASE", "AMYLASE" in the last 168 hours. No results for input(s): "AMMONIA" in the last 168 hours. CBC: Recent Labs  Lab 07/21/22 1538 07/22/22 0438 07/23/22 0225 07/23/22 0938  WBC 11.1* 9.2 9.7  --   NEUTROABS 9.7*  --  8.1*  --   HGB 7.8* 7.1* 6.8*  --   HCT 26.3* 24.0* 22.8*  --   MCV 87.4 87.0 85.7  --   PLT 241 231 200 170   Cardiac Enzymes: No results for input(s): "CKTOTAL", "CKMB", "CKMBINDEX", "TROPONINI" in the last 168 hours. CBG: Recent Labs  Lab 07/22/22 2110 07/23/22 0043 07/23/22 0405 07/23/22 0757 07/23/22 1209  GLUCAP 113* 130* 120* 116* 225*    Studies/Results: VAS [place] LOWER EXTREMITY VENOUS (DVT)  Result Date: 07/22/2022  Lower Venous DVT Study Patient  Name:  Jean Mcclain  Date of Exam:   07/22/2022 Medical Rec #: [id number]         Accession #:    [id number] Date of Birth: [birth year]          Patient Gender: F Patient Age:   76 years Exam Location:  [facility] Procedure:      VAS [place] LOWER EXTREMITY VENOUS (DVT) Referring Phys: Deno Etienne [facility] --------------------------------------------------------------------------------  Indications: Swelling.  Risk Factors: LLE DVT 2018. Anticoagulation: Eliquis. Limitations: Poor ultrasound/tissue interface. Comparison Study: Previous exam on 10/10/19 was negative for DVT. Performing Technologist: Rogelia Rohrer RVT, RDMS  Examination Guidelines: A complete evaluation includes B-mode imaging, spectral Doppler, color Doppler, and power Doppler as needed of all accessible portions of each vessel. Bilateral testing is  considered an integral part of a complete examination. Limited examinations for reoccurring indications may be performed as noted. The reflux portion of the exam is performed with the patient in reverse Trendelenburg.  +---------+---------------+---------+-----------+----------+-------------------+ RIGHT    CompressibilityPhasicitySpontaneityPropertiesThrombus Aging      +---------+---------------+---------+-----------+----------+-------------------+ CFV      Full           Yes      Yes                                      +---------+---------------+---------+-----------+----------+-------------------+ SFJ      Full                                                             +---------+---------------+---------+-----------+----------+-------------------+ FV Prox  Full           Yes      Yes                                      +---------+---------------+---------+-----------+----------+-------------------+ FV Mid   Full           Yes      Yes                                      +---------+---------------+---------+-----------+----------+-------------------+ FV DistalFull           Yes      Yes                                      +---------+---------------+---------+-----------+----------+-------------------+ PFV      Full                                                             +---------+---------------+---------+-----------+----------+-------------------+ POP      Full           Yes      Yes                                      +---------+---------------+---------+-----------+----------+-------------------+  PTV                                                   Not well visualized +---------+---------------+---------+-----------+----------+-------------------+ PERO                                                  Not well visualized +---------+---------------+---------+-----------+----------+-------------------+    +---------+---------------+---------+-----------+----------+-------------------+ LEFT     CompressibilityPhasicitySpontaneityPropertiesThrombus Aging      +---------+---------------+---------+-----------+----------+-------------------+ CFV      Full           Yes      Yes                                      +---------+---------------+---------+-----------+----------+-------------------+ SFJ      Full                                                             +---------+---------------+---------+-----------+----------+-------------------+ FV Prox  Full           Yes      Yes                                      +---------+---------------+---------+-----------+----------+-------------------+ FV Mid   Full           Yes      Yes                                      +---------+---------------+---------+-----------+----------+-------------------+ FV DistalFull           Yes      Yes                                      +---------+---------------+---------+-----------+----------+-------------------+ PFV      Full                                                             +---------+---------------+---------+-----------+----------+-------------------+ POP      Full           Yes      Yes                                      +---------+---------------+---------+-----------+----------+-------------------+ PTV      Full  Not well visualized +---------+---------------+---------+-----------+----------+-------------------+ PERO     Full                                         Not well visualized +---------+---------------+---------+-----------+----------+-------------------+     Summary: BILATERAL: - No evidence of deep vein thrombosis seen in the lower extremities, bilaterally. -No evidence of popliteal cyst, bilaterally.   *See table(s) above for measurements and observations. Electronically signed by Monica Martinez MD on 07/22/2022 at 1:40:37 PM.    Final    ECHOCARDIOGRAM COMPLETE  Result Date: 07/22/2022    ECHOCARDIOGRAM REPORT   Patient Name:   Jean Mcclain Date of Exam: 07/22/2022 Medical Rec #:  [id number]        Height:       60.0 in Accession #:    [id number]       Weight:       163.0 lb Date of Birth:  December 28, 1945         BSA:          31.711 m Patient Age:    76 years         BP:           120/49 mmHg Patient Gender: F                HR:           66 bpm. Exam Location:  Inpatient Procedure: 2D Echo, Cardiac Doppler and Color Doppler Indications:    Syncope R55  History:        Patient has prior history of Echocardiogram examinations, most                 recent 03/11/2017. Risk Factors:Hypertension, Sleep Apnea,                 Diabetes and Dyslipidemia.  Sonographer:    Ronny Flurry Referring Phys: [place]  1. Left ventricular ejection fraction, by estimation, is 60 to 65%. The left ventricle has normal function. The left ventricle has no regional wall motion abnormalities. There is mild left ventricular hypertrophy. Left ventricular diastolic parameters are consistent with Grade I diastolic dysfunction (impaired relaxation).  2. Right ventricular systolic function is normal. The right ventricular size is normal. Tricuspid regurgitation signal is inadequate for assessing PA pressure.  3. Left atrial size was moderately dilated.  4. Right atrial size was mildly dilated.  5. Trivial mitral valve regurgitation.  6. There is moderate calcification of the aortic valve. Aortic valve regurgitation is not visualized. Mild aortic valve stenosis.  7. The inferior vena cava is normal in size with greater than 50% respiratory variability, suggesting right atrial pressure of 3 mmHg. FINDINGS  Left Ventricle: Left ventricular ejection fraction, by estimation, is 60 to 65%. The left ventricle has normal function. The left ventricle has no regional wall motion abnormalities. The left  ventricular internal cavity size was normal in size. There is  mild left ventricular hypertrophy. Left ventricular diastolic parameters are consistent with Grade I diastolic dysfunction (impaired relaxation). Right Ventricle: The right ventricular size is normal. No increase in right ventricular wall thickness. Right ventricular systolic function is normal. Tricuspid regurgitation signal is inadequate for assessing PA pressure. Left Atrium: Left atrial size was moderately dilated. Right Atrium: Right atrial size was mildly dilated. Pericardium: There is no evidence of pericardial effusion. Mitral Valve: There is severe  calcification of the mitral valve leaflet(s). Trivial mitral valve regurgitation. MV peak gradient, 9.7 mmHg. The mean mitral valve gradient is 4.5 mmHg. Tricuspid Valve: The tricuspid valve is normal in structure. Tricuspid valve regurgitation is not demonstrated. Aortic Valve: There is moderate calcification of the aortic valve. Aortic valve regurgitation is not visualized. Mild aortic stenosis is present. Aortic valve mean gradient measures 11.0 mmHg. Aortic valve peak gradient measures 18.5 mmHg. Aortic valve area, by VTI measures 2.14 cm. Pulmonic Valve: The pulmonic valve was grossly normal. Pulmonic valve regurgitation is not visualized. Aorta: The aortic root and ascending aorta are structurally normal, with no evidence of dilitation. Venous: The inferior vena cava is normal in size with greater than 50% respiratory variability, suggesting right atrial pressure of 3 mmHg. IAS/Shunts: No atrial level shunt detected by color flow Doppler.  LEFT VENTRICLE PLAX 2D LVIDd:         3.80 cm   Diastology LVIDs:         2.60 cm   LV e' medial:    6.09 cm/s LV PW:         1.10 cm   LV E/e' medial:  20.2 LV IVS:        1.20 cm   LV e' lateral:   6.64 cm/s LVOT diam:     2.10 cm   LV E/e' lateral: 18.5 LV SV:         113 LV SV Index:   66 LVOT Area:     3.46 cm  RIGHT VENTRICLE             IVC RV S  prime:     11.00 cm/s  IVC diam: 1.60 cm TAPSE (M-mode): 2.4 cm LEFT ATRIUM             Index        RIGHT ATRIUM           Index LA diam:        4.00 cm 2.34 cm/m   RA Area:     10.70 cm LA Vol (A2C):   84.2 ml 49.21 ml/m  RA Volume:   20.60 ml  12.04 ml/m LA Vol (A4C):   51.9 ml 30.33 ml/m LA Biplane Vol: 72.5 ml 42.37 ml/m  AORTIC VALVE AV Area (Vmax):    1.85 cm AV Area (Vmean):   1.99 cm AV Area (VTI):     2.14 cm AV Vmax:           215.00 cm/s AV Vmean:          160.000 cm/s AV VTI:            0.527 m AV Peak Grad:      18.5 mmHg AV Mean Grad:      11.0 mmHg LVOT Vmax:         115.00 cm/s LVOT Vmean:        91.900 cm/s LVOT VTI:          0.326 m LVOT/AV VTI ratio: 0.62  AORTA Ao Root diam: 3.20 cm Ao Asc diam:  3.30 cm MITRAL VALVE MV Area (PHT): 2.73 cm     SHUNTS MV Area VTI:   2.06 cm     Systemic VTI:  0.33 m MV Peak grad:  9.7 mmHg     Systemic Diam: 2.10 cm MV Mean grad:  4.5 mmHg MV Vmax:       1.56 m/s MV Vmean:      99.9 cm/s MV Decel Time:  278 msec MR Peak grad: 63.5 mmHg MR Vmax:      398.50 cm/s MV E velocity: 123.00 cm/s MV A velocity: 143.00 cm/s MV E/A ratio:  0.86 Landscape architect signed by Phineas Inches Signature Date/Time: 07/22/2022/10:32:06 AM    Final    CT ABDOMEN PELVIS W CONTRAST  Result Date: 07/21/2022 CLINICAL DATA:  Abdominal pain, nausea/vomiting EXAM: CT ABDOMEN AND PELVIS WITH CONTRAST TECHNIQUE: Multidetector CT imaging of the abdomen and pelvis was performed using the standard protocol following bolus administration of intravenous contrast. RADIATION DOSE REDUCTION: This exam was performed according to the departmental dose-optimization program which includes automated exposure control, adjustment of the mA and/or kV according to patient size and/or use of iterative reconstruction technique. CONTRAST:  24m OMNIPAQUE IOHEXOL 350 MG/ML SOLN COMPARISON:  01/09/2022 and 07/28/2020 FINDINGS: Lower chest: Lung bases are clear. Hepatobiliary: Liver is within  normal limits. Gallbladder is unremarkable. No intrahepatic or extrahepatic duct dilatation. Pancreas: Within normal limits. Spleen: Within normal limits. Adrenals/Urinary Tract: Right adrenal gland is within normal limits. At least 2 left adrenal nodules measuring up to 10 mm (series 3/image 22), unchanged from 2021, favoring benign adrenal adenomas. Left renal cortical atrophy. 10 mm right lower pole simple cyst (series 3/image 38), benign (Bosniak I). However, there is a 16 mm hyperdense lesion in the anterior right upper kidney (series 3/image 32), similar to the most recent prior but previously measuring 9 mm in 2021. Differential considerations include benign hemorrhagic cyst versus small papillary renal cell carcinoma. No hydronephrosis. Mildly thick-walled bladder, although underdistended. Stomach/Bowel: Stomach is within normal limits. No evidence of bowel obstruction. Normal appendix (series 3/image 60). Sigmoid diverticulosis with mild pericolonic inflammatory changes in the left lower quadrant, suggesting sigmoid diverticulitis, and a suspected 1.5 x 2.5 cm intramural abscess (series 3/image 61). No free air. Vascular/Lymphatic: No evidence of abdominal aortic aneurysm. Atherosclerotic calcifications of the abdominal aorta and branch vessels. No suspicious abdominopelvic lymphadenopathy. Reproductive: Uterus is within normal limits. Bilateral ovaries are grossly unremarkable. Other: No abdominopelvic ascites. Musculoskeletal: Degenerative changes of the visualized thoracolumbar spine. IMPRESSION: Acute sigmoid diverticulitis with suspected 2.5 cm intramural abscess. No free air. 16 mm hyperdense lesion in the anterior right upper kidney, mildly increased from 2021. Differential considerations include benign hemorrhagic cyst versus small papillary renal cell carcinoma. Consider follow-up MRI abdomen with/without contrast in 6 months. Additional ancillary findings as above. Electronically Signed   By:  SJulian HyM.D.   On: 07/21/2022 23:43   MR BRAIN WO CONTRAST  Result Date: 07/21/2022 CLINICAL DATA:  Diplopia EXAM: MRI HEAD WITHOUT CONTRAST TECHNIQUE: Multiplanar, multiecho pulse sequences of the brain and surrounding structures were obtained without intravenous contrast. COMPARISON:  None Available. FINDINGS: Brain: No acute infarct, mass effect or extra-axial collection. No acute or chronic hemorrhage. There is multifocal hyperintense T2-weighted signal within the white matter. Parenchymal volume and CSF spaces are normal. The midline structures are normal. Vascular: Major flow voids are preserved. Skull and upper cervical spine: Normal calvarium and skull base. Visualized upper cervical spine and soft tissues are normal. Sinuses/Orbits:No paranasal sinus fluid levels or advanced mucosal thickening. No mastoid or middle ear effusion. Normal orbits. IMPRESSION: 1. No acute intracranial abnormality. 2. Findings of chronic small vessel ischemia. Electronically Signed   By: KUlyses JarredM.D.   On: 07/21/2022 22:13   DG Chest Portable 1 View  Result Date: 07/21/2022 CLINICAL DATA:  Hypoxia, hypertension EXAM: PORTABLE CHEST 1 VIEW COMPARISON:  Chest 01/09/2022 FINDINGS: Heart size upper normal. Vascularity normal.  Lungs clear without infiltrate or effusion. Bilateral shoulder subluxation appears chronic and unchanged. IMPRESSION: No active disease. Electronically Signed   By: Franchot Gallo M.D.   On: 07/21/2022 19:07   Medications:  cefTRIAXone (ROCEPHIN)  IV 2 g (07/22/22 2215)   magnesium sulfate bolus IVPB     metronidazole 500 mg (07/23/22 1110)    sodium chloride   Intravenous Once   atorvastatin  20 mg Oral Daily   [START ON 07/27/2022] buprenorphine  1 patch Transdermal Weekly   calcium acetate  667 mg Oral TID WC   carbidopa-levodopa  1 tablet Oral BID   carbidopa-levodopa  2 tablet Oral q AM   Chlorhexidine Gluconate Cloth  6 each Topical Q0600   docusate sodium  200 mg  Oral QHS   doxercalciferol  2 mcg Intravenous Q M,W,F-HD   fluticasone  1 spray Each Nare BID   gabapentin  300 mg Oral QHS   insulin aspart  0-6 Units Subcutaneous Q4H   melatonin  5 mg Oral QHS   midodrine  20 mg Oral Q M,W,F-HD   mirtazapine  15 mg Oral QHS   nystatin  1 Application Topical [id number]   pantoprazole (PROTONIX) IV  40 mg Intravenous Q12H   rOPINIRole  12 mg Oral Daily   tamsulosin  0.4 mg Oral QPC supper

## 2022-07-23 NOTE — Progress Notes (Signed)
PROGRESS NOTE                                                                                                                                                                                                             Patient Demographics:    Jean Mcclain, is a 76 y.o. female, DOB - 1945/11/16, ONG:[id number]  Outpatient Primary MD for the patient is Pcp, No    LOS - 1  Admit date - 07/21/2022    Chief Complaint  Patient presents with   Hypotension   Blurred Vision       Brief Narrative (HPI from H&P)   76 y.o. female with medical history significant of ESRD on HD, full treatment yesterday, DM2, prior DVT on eliquis, Parkinson's, chronic hypotension on midodrine on her dialysis days, who lives at an SNF and has been having blurry vision for the last several months was brought in after she became severely hypotensive with blurry vision, systolic blood pressure was in 70s she was brought to the ER where work-up showed asymptomatic diverticular abscess however she had significant drop in her hemoglobin from a baseline of about 12-8, no known blood in stool or melena, initial Hemoccult negative.   Subjective:   Patient in bed, appears comfortable, denies any headache, no fever, no chest pain or pressure, no shortness of breath , no abdominal pain. No focal weakness.   Assessment  & Plan :    Symptomatic hypotension and anemia in a patient with incidental finding of diverticular abscess, underlying history of anemia of chronic disease and ESRD. She is surprisingly pain and fever free, question the diagnosis of diverticular abscess, seen by general surgery and currently on clear liquid diet and IV antibiotics.  Her main issue seems to be a significant drop in her hemoglobin, no known blood in stool or melena.  Leave the unit of blood on 07/23/2022, anemia panel inconclusive, stable LDH and haptoglobin levels, DIC panel  pending.  Acute anemia of unclear etiology in a patient with anemia of chronic disease.  Currently see #1 above.  Ongoing blurry vision for several months.  MRI brain negative, probably got worsened due to hypotension, now close to baseline, outpatient ophthalmology follow-up.  ESRD.  On MWF schedule.  Nephrology consulted.  Hypotension.  Scheduled midodrine on the days of dialysis, as needed otherwise.  Stable cortisol,  slightly suppressed TSH stable echocardiogram with preserved EF of 60%, no wall motion abnormalities or significant valvular problems.  Mildly suppressed TSH.  Could be sick euthyroid, check free T4 and T3.  Kidney lesion - Mildly increased in size since 2021. Consider follow up MRI abd in 6 months arranged by PCP.  HX of Parkinson's.  Continue home medications.    History of DVT.  Patient thinks it several months ago.  Negative venous duplex now this admission, hold anticoagulation for now due to severe anemia.  We will have her address with PCP for long-term anticoagulation.  Insulin-requiring or dependent type II diabetes mellitus ([place]) - Very sensitive SSI Q4H for the moment.  Lab Results  Component Value Date   HGBA1C 6.9 (H) 07/22/2022   CBG (last 3)  Recent Labs    07/23/22 0043 07/23/22 0405 07/23/22 0757  GLUCAP 130* 120* 116*        Condition - Extremely Guarded  Family Communication  :  None  Code Status :  DNR  Consults  :  CCS, Renal, GI  PUD Prophylaxis : PPI   Procedures  :     Leg [place] - No evidence of deep vein thrombosis seen in the lower extremities, bilaterally. -No evidence of popliteal cyst, bilaterally.    TTE - 1. Left ventricular ejection fraction, by estimation, is 60 to 65%. The left ventricle has normal function. The left ventricle has no regional wall motion abnormalities. There is mild left ventricular hypertrophy. Left ventricular diastolic parameters are consistent with Grade I diastolic dysfunction (impaired relaxation).   2. Right ventricular systolic function is normal. The right ventricular size is normal. Tricuspid regurgitation signal is inadequate for assessing PA pressure.  3. Left atrial size was moderately dilated.  4. Right atrial size was mildly dilated.  5. Trivial mitral valve regurgitation.  6. There is moderate calcification of the aortic valve. Aortic valve regurgitation is not visualized. Mild aortic valve stenosis.  7. The inferior vena cava is normal in size with greater than 50% respiratory variability, suggesting right atrial pressure of 3 mmHg.  MRI - 1. No acute intracranial abnormality. 2. Findings of chronic small vessel ischemia  CT -  Acute sigmoid diverticulitis with suspected 2.5 cm intramural abscess. No free air. 16 mm hyperdense lesion in the anterior right upper kidney, mildly increased from 2021. Differential considerations include benign hemorrhagic cyst versus small papillary renal cell carcinoma. Consider follow-up MRI abdomen with/without contrast in 6 months. Additional ancillary findings as above      Disposition Plan  :    Status is: Observation  DVT Prophylaxis  :    Place and maintain sequential compression device Start: 07/22/22 0553   Lab Results  Component Value Date   PLT 200 07/23/2022    Diet :  Diet Order             Diet clear liquid Room service appropriate? Yes; Fluid consistency: Thin  Diet effective now                    Inpatient Medications  Scheduled Meds:  sodium chloride   Intravenous Once   atorvastatin  20 mg Oral Daily   [START ON 07/27/2022] buprenorphine  1 patch Transdermal Weekly   calcium acetate  667 mg Oral TID WC   carbidopa-levodopa  1 tablet Oral BID   carbidopa-levodopa  2 tablet Oral q AM   Chlorhexidine Gluconate Cloth  6 each Topical Q0600   docusate sodium  200 mg Oral QHS   doxercalciferol  2 mcg Intravenous Q M,W,F-HD   fluticasone  1 spray Each Nare BID   gabapentin  300 mg Oral QHS   insulin aspart  0-6  Units Subcutaneous Q4H   melatonin  5 mg Oral QHS   midodrine  20 mg Oral Q M,W,F-HD   mirtazapine  15 mg Oral QHS   nystatin  1 Application Topical [id number]   pantoprazole (PROTONIX) IV  40 mg Intravenous Q12H   rOPINIRole  12 mg Oral Daily   tamsulosin  0.4 mg Oral QPC supper   Continuous Infusions:  cefTRIAXone (ROCEPHIN)  IV 2 g (07/22/22 2215)   magnesium sulfate bolus IVPB     metronidazole 500 mg (07/22/22 2250)   PRN Meds:.acetaminophen **OR** acetaminophen, albuterol, camphor-menthol, hydrOXYzine, midodrine, [DISCONTINUED] ondansetron **OR** ondansetron (ZOFRAN) IV, polyethylene glycol, simethicone  Antibiotics  :    Anti-infectives (From admission, onward)    Start     Dose/Rate Route Frequency Ordered Stop   07/22/22 0015  cefTRIAXone (ROCEPHIN) 2 g in sodium chloride 0.9 % 100 mL IVPB        2 g 200 mL/hr over 30 Minutes Intravenous Daily at 10 pm 07/22/22 0006     07/22/22 0015  metroNIDAZOLE (FLAGYL) IVPB 500 mg        500 mg 100 mL/hr over 60 Minutes Intravenous 2 times daily 07/22/22 0006     07/22/22 0000  piperacillin-tazobactam (ZOSYN) IVPB 3.375 g  Status:  Discontinued        3.375 g 100 mL/hr over 30 Minutes Intravenous  Once 07/21/22 2349 07/22/22 0006         Objective:   Vitals:   07/22/22 2242 07/23/22 0400 07/23/22 0415 07/23/22 0737  BP:  (!) 96/54 (!) 104/41 (!) 107/57  Pulse:  (!) 51 (!) 50 (!) 55  Resp:  '18 16 16  '$ Temp:  97.8 F (36.6 C) 98.2 F (36.8 C) 98.1 F (36.7 C)  TempSrc:  Oral  Oral  SpO2:  100%  100%  Weight: 70.2 kg     Height: 5' (1.524 m)       Wt Readings from Last 3 Encounters:  07/22/22 70.2 kg  06/21/22 73.9 kg  02/11/22 80.4 kg     Intake/Output Summary (Last 24 hours) at 07/23/2022 0900 Last data filed at 07/23/2022 0745 Gross per 24 hour  Intake 832.16 ml  Output 2000 ml  Net -1167.84 ml     Physical Exam  Awake Alert, No new F.N deficits, Normal affect [place].AT,PERRAL Supple Neck, No JVD,    Symmetrical Chest wall movement, Good air movement bilaterally, CTAB RRR,No Gallops, Rubs or new Murmurs,  +ve B.Sounds, Abd Soft, No tenderness,   1+ edema       Data Review:    CBC Recent Labs  Lab 07/21/22 1538 07/22/22 0438 07/23/22 0225  WBC 11.1* 9.2 9.7  HGB 7.8* 7.1* 6.8*  HCT 26.3* 24.0* 22.8*  PLT 241 231 200  MCV 87.4 87.0 85.7  MCH 25.9* 25.7* 25.6*  MCHC 29.7* 29.6* 29.8*  RDW 24.0* 24.0* 23.9*  LYMPHSABS 0.7  --  0.8  MONOABS 0.5  --  0.5  EOSABS 0.1  --  0.2  BASOSABS 0.0  --  0.0    Electrolytes Recent Labs  Lab 07/21/22 1538 07/21/22 1838 07/22/22 0438 07/22/22 1716 07/23/22 0225  NA 138  --  138  --  133*  K 3.7  --  3.7  --  3.3*  CL 97*  --  101  --  99  CO2 30  --  30  --  27  GLUCOSE 135*  --  105*  --  121*  BUN 9  --  11  --  6*  CREATININE 3.43*  --  3.71*  --  2.37*  CALCIUM 8.4*  --  8.0*  --  7.8*  AST 9*  --   --   --   --   ALT <5  --   --   --   --   ALKPHOS 102  --   --   --   --   BILITOT 0.5  --   --   --   --   ALBUMIN 2.9*  --   --   --   --   MG  --   --   --   --  1.4*  TSH  --  0.241*  --  0.212*  --   HGBA1C  --   --  6.9*  --   --   BNP  --   --   --   --  401.3*    ------------------------------------------------------------------------------------------------------------------ No results for input(s): "CHOL", "HDL", "LDLCALC", "TRIG", "CHOLHDL", "LDLDIRECT" in the last 72 hours.  Lab Results  Component Value Date   HGBA1C 6.9 (H) 07/22/2022    Recent Labs    07/22/22 1716  TSH 0.212*   ------------------------------------------------------------------------------------------------------------------ ID Labs Recent Labs  Lab 07/21/22 1538 07/22/22 0438 07/23/22 0225  WBC 11.1* 9.2 9.7  PLT 241 231 200  CREATININE 3.43* 3.71* 2.37*   Cardiac Enzymes No results for input(s): "CKMB", "TROPONINI", "MYOGLOBIN" in the last 168 hours.  Invalid input(s): "CK"    Micro Results No results found  for this or any previous visit (from the past 240 hour(s)).  Radiology Reports VAS [place] LOWER EXTREMITY VENOUS (DVT)  Result Date: 07/22/2022  Lower Venous DVT Study Patient Name:  Jean Mcclain  Date of Exam:   07/22/2022 Medical Rec #: [id number]         Accession #:    [id number] Date of Birth: 1945/12/30          Patient Gender: F Patient Age:   79 years Exam Location:  [facility] Procedure:      VAS [place] LOWER EXTREMITY VENOUS (DVT) Referring Phys: Deno Etienne [facility] --------------------------------------------------------------------------------  Indications: Swelling.  Risk Factors: LLE DVT 2018. Anticoagulation: Eliquis. Limitations: Poor ultrasound/tissue interface. Comparison Study: Previous exam on 10/10/19 was negative for DVT. Performing Technologist: Rogelia Rohrer RVT, RDMS  Examination Guidelines: A complete evaluation includes B-mode imaging, spectral Doppler, color Doppler, and power Doppler as needed of all accessible portions of each vessel. Bilateral testing is considered an integral part of a complete examination. Limited examinations for reoccurring indications may be performed as noted. The reflux portion of the exam is performed with the patient in reverse Trendelenburg.  +---------+---------------+---------+-----------+----------+-------------------+ RIGHT    CompressibilityPhasicitySpontaneityPropertiesThrombus Aging      +---------+---------------+---------+-----------+----------+-------------------+ CFV      Full           Yes      Yes                                      +---------+---------------+---------+-----------+----------+-------------------+ SFJ      Full                                                             +---------+---------------+---------+-----------+----------+-------------------+  FV Prox  Full           Yes      Yes                                      +---------+---------------+---------+-----------+----------+-------------------+  FV Mid   Full           Yes      Yes                                      +---------+---------------+---------+-----------+----------+-------------------+ FV DistalFull           Yes      Yes                                      +---------+---------------+---------+-----------+----------+-------------------+ PFV      Full                                                             +---------+---------------+---------+-----------+----------+-------------------+ POP      Full           Yes      Yes                                      +---------+---------------+---------+-----------+----------+-------------------+ PTV                                                   Not well visualized +---------+---------------+---------+-----------+----------+-------------------+ PERO                                                  Not well visualized +---------+---------------+---------+-----------+----------+-------------------+   +---------+---------------+---------+-----------+----------+-------------------+ LEFT     CompressibilityPhasicitySpontaneityPropertiesThrombus Aging      +---------+---------------+---------+-----------+----------+-------------------+ CFV      Full           Yes      Yes                                      +---------+---------------+---------+-----------+----------+-------------------+ SFJ      Full                                                             +---------+---------------+---------+-----------+----------+-------------------+ FV Prox  Full           Yes      Yes                                      +---------+---------------+---------+-----------+----------+-------------------+  FV Mid   Full           Yes      Yes                                      +---------+---------------+---------+-----------+----------+-------------------+ FV DistalFull           Yes      Yes                                       +---------+---------------+---------+-----------+----------+-------------------+ PFV      Full                                                             +---------+---------------+---------+-----------+----------+-------------------+ POP      Full           Yes      Yes                                      +---------+---------------+---------+-----------+----------+-------------------+ PTV      Full                                         Not well visualized +---------+---------------+---------+-----------+----------+-------------------+ PERO     Full                                         Not well visualized +---------+---------------+---------+-----------+----------+-------------------+     Summary: BILATERAL: - No evidence of deep vein thrombosis seen in the lower extremities, bilaterally. -No evidence of popliteal cyst, bilaterally.   *See table(s) above for measurements and observations. Electronically signed by Monica Martinez MD on 07/22/2022 at 1:40:37 PM.    Final    ECHOCARDIOGRAM COMPLETE  Result Date: 07/22/2022    ECHOCARDIOGRAM REPORT   Patient Name:   Jean Mcclain Date of Exam: 07/22/2022 Medical Rec #:  [id number]        Height:       60.0 in Accession #:    [id number]       Weight:       163.0 lb Date of Birth:  1946/09/08         BSA:          42.711 m Patient Age:    56 years         BP:           120/49 mmHg Patient Gender: F                HR:           66 bpm. Exam Location:  Inpatient Procedure: 2D Echo, Cardiac Doppler and Color Doppler Indications:    Syncope R55  History:        Patient has prior history of Echocardiogram examinations, most  recent 03/11/2017. Risk Factors:Hypertension, Sleep Apnea,                 Diabetes and Dyslipidemia.  Sonographer:    Ronny Flurry Referring Phys: Bode  1. Left ventricular ejection fraction, by estimation, is 60 to 65%. The left ventricle has normal function. The left  ventricle has no regional wall motion abnormalities. There is mild left ventricular hypertrophy. Left ventricular diastolic parameters are consistent with Grade I diastolic dysfunction (impaired relaxation).  2. Right ventricular systolic function is normal. The right ventricular size is normal. Tricuspid regurgitation signal is inadequate for assessing PA pressure.  3. Left atrial size was moderately dilated.  4. Right atrial size was mildly dilated.  5. Trivial mitral valve regurgitation.  6. There is moderate calcification of the aortic valve. Aortic valve regurgitation is not visualized. Mild aortic valve stenosis.  7. The inferior vena cava is normal in size with greater than 50% respiratory variability, suggesting right atrial pressure of 3 mmHg. FINDINGS  Left Ventricle: Left ventricular ejection fraction, by estimation, is 60 to 65%. The left ventricle has normal function. The left ventricle has no regional wall motion abnormalities. The left ventricular internal cavity size was normal in size. There is  mild left ventricular hypertrophy. Left ventricular diastolic parameters are consistent with Grade I diastolic dysfunction (impaired relaxation). Right Ventricle: The right ventricular size is normal. No increase in right ventricular wall thickness. Right ventricular systolic function is normal. Tricuspid regurgitation signal is inadequate for assessing PA pressure. Left Atrium: Left atrial size was moderately dilated. Right Atrium: Right atrial size was mildly dilated. Pericardium: There is no evidence of pericardial effusion. Mitral Valve: There is severe calcification of the mitral valve leaflet(s). Trivial mitral valve regurgitation. MV peak gradient, 9.7 mmHg. The mean mitral valve gradient is 4.5 mmHg. Tricuspid Valve: The tricuspid valve is normal in structure. Tricuspid valve regurgitation is not demonstrated. Aortic Valve: There is moderate calcification of the aortic valve. Aortic valve  regurgitation is not visualized. Mild aortic stenosis is present. Aortic valve mean gradient measures 11.0 mmHg. Aortic valve peak gradient measures 18.5 mmHg. Aortic valve area, by VTI measures 2.14 cm. Pulmonic Valve: The pulmonic valve was grossly normal. Pulmonic valve regurgitation is not visualized. Aorta: The aortic root and ascending aorta are structurally normal, with no evidence of dilitation. Venous: The inferior vena cava is normal in size with greater than 50% respiratory variability, suggesting right atrial pressure of 3 mmHg. IAS/Shunts: No atrial level shunt detected by color flow Doppler.  LEFT VENTRICLE PLAX 2D LVIDd:         3.80 cm   Diastology LVIDs:         2.60 cm   LV e' medial:    6.09 cm/s LV PW:         1.10 cm   LV E/e' medial:  20.2 LV IVS:        1.20 cm   LV e' lateral:   6.64 cm/s LVOT diam:     2.10 cm   LV E/e' lateral: 18.5 LV SV:         113 LV SV Index:   66 LVOT Area:     3.46 cm  RIGHT VENTRICLE             IVC RV S prime:     11.00 cm/s  IVC diam: 1.60 cm TAPSE (M-mode): 2.4 cm LEFT ATRIUM  Index        RIGHT ATRIUM           Index LA diam:        4.00 cm 2.34 cm/m   RA Area:     10.70 cm LA Vol (A2C):   84.2 ml 49.21 ml/m  RA Volume:   20.60 ml  12.04 ml/m LA Vol (A4C):   51.9 ml 30.33 ml/m LA Biplane Vol: 72.5 ml 42.37 ml/m  AORTIC VALVE AV Area (Vmax):    1.85 cm AV Area (Vmean):   1.99 cm AV Area (VTI):     2.14 cm AV Vmax:           215.00 cm/s AV Vmean:          160.000 cm/s AV VTI:            0.527 m AV Peak Grad:      18.5 mmHg AV Mean Grad:      11.0 mmHg LVOT Vmax:         115.00 cm/s LVOT Vmean:        91.900 cm/s LVOT VTI:          0.326 m LVOT/AV VTI ratio: 0.62  AORTA Ao Root diam: 3.20 cm Ao Asc diam:  3.30 cm MITRAL VALVE MV Area (PHT): 2.73 cm     SHUNTS MV Area VTI:   2.06 cm     Systemic VTI:  0.33 m MV Peak grad:  9.7 mmHg     Systemic Diam: 2.10 cm MV Mean grad:  4.5 mmHg MV Vmax:       1.56 m/s MV Vmean:      99.9 cm/s MV Decel  Time: 278 msec MR Peak grad: 63.5 mmHg MR Vmax:      398.50 cm/s MV E velocity: 123.00 cm/s MV A velocity: 143.00 cm/s MV E/A ratio:  0.86 Landscape architect signed by Phineas Inches Signature Date/Time: 07/22/2022/10:32:06 AM    Final    CT ABDOMEN PELVIS W CONTRAST  Result Date: 07/21/2022 CLINICAL DATA:  Abdominal pain, nausea/vomiting EXAM: CT ABDOMEN AND PELVIS WITH CONTRAST TECHNIQUE: Multidetector CT imaging of the abdomen and pelvis was performed using the standard protocol following bolus administration of intravenous contrast. RADIATION DOSE REDUCTION: This exam was performed according to the departmental dose-optimization program which includes automated exposure control, adjustment of the mA and/or kV according to patient size and/or use of iterative reconstruction technique. CONTRAST:  30m OMNIPAQUE IOHEXOL 350 MG/ML SOLN COMPARISON:  01/09/2022 and 07/28/2020 FINDINGS: Lower chest: Lung bases are clear. Hepatobiliary: Liver is within normal limits. Gallbladder is unremarkable. No intrahepatic or extrahepatic duct dilatation. Pancreas: Within normal limits. Spleen: Within normal limits. Adrenals/Urinary Tract: Right adrenal gland is within normal limits. At least 2 left adrenal nodules measuring up to 10 mm (series 3/image 22), unchanged from 2021, favoring benign adrenal adenomas. Left renal cortical atrophy. 10 mm right lower pole simple cyst (series 3/image 38), benign (Bosniak I). However, there is a 16 mm hyperdense lesion in the anterior right upper kidney (series 3/image 32), similar to the most recent prior but previously measuring 9 mm in 2021. Differential considerations include benign hemorrhagic cyst versus small papillary renal cell carcinoma. No hydronephrosis. Mildly thick-walled bladder, although underdistended. Stomach/Bowel: Stomach is within normal limits. No evidence of bowel obstruction. Normal appendix (series 3/image 60). Sigmoid diverticulosis with mild pericolonic  inflammatory changes in the left lower quadrant, suggesting sigmoid diverticulitis, and a suspected 1.5 x 2.5 cm intramural abscess (series 3/image  61). No free air. Vascular/Lymphatic: No evidence of abdominal aortic aneurysm. Atherosclerotic calcifications of the abdominal aorta and branch vessels. No suspicious abdominopelvic lymphadenopathy. Reproductive: Uterus is within normal limits. Bilateral ovaries are grossly unremarkable. Other: No abdominopelvic ascites. Musculoskeletal: Degenerative changes of the visualized thoracolumbar spine. IMPRESSION: Acute sigmoid diverticulitis with suspected 2.5 cm intramural abscess. No free air. 16 mm hyperdense lesion in the anterior right upper kidney, mildly increased from 2021. Differential considerations include benign hemorrhagic cyst versus small papillary renal cell carcinoma. Consider follow-up MRI abdomen with/without contrast in 6 months. Additional ancillary findings as above. Electronically Signed   By: Julian Hy M.D.   On: 07/21/2022 23:43   MR BRAIN WO CONTRAST  Result Date: 07/21/2022 CLINICAL DATA:  [place] EXAM: MRI HEAD WITHOUT CONTRAST TECHNIQUE: Multiplanar, multiecho pulse sequences of the brain and surrounding structures were obtained without intravenous contrast. COMPARISON:  None Available. FINDINGS: Brain: No acute infarct, mass effect or extra-axial collection. No acute or chronic hemorrhage. There is multifocal hyperintense T2-weighted signal within the white matter. Parenchymal volume and CSF spaces are normal. The midline structures are normal. Vascular: Major flow voids are preserved. Skull and upper cervical spine: Normal calvarium and skull base. Visualized upper cervical spine and soft tissues are normal. Sinuses/Orbits:No paranasal sinus fluid levels or advanced mucosal thickening. No mastoid or middle ear effusion. Normal orbits. IMPRESSION: 1. No acute intracranial abnormality. 2. Findings of chronic small vessel ischemia.  Electronically Signed   By: Ulyses Jarred M.D.   On: 07/21/2022 22:13   DG Chest Portable 1 View  Result Date: 07/21/2022 CLINICAL DATA:  Hypoxia, hypertension EXAM: PORTABLE CHEST 1 VIEW COMPARISON:  Chest 01/09/2022 FINDINGS: Heart size upper normal. Vascularity normal. Lungs clear without infiltrate or effusion. Bilateral shoulder subluxation appears chronic and unchanged. IMPRESSION: No active disease. Electronically Signed   By: Franchot Gallo M.D.   On: 07/21/2022 19:07      Signature  Lala Lund M.D on 07/23/2022 at 9:00 AM   -  To page go to [email or web address]

## 2022-07-23 NOTE — Progress Notes (Signed)
Tele called, HR drop to 48, patient is sleeping. Wake her up and Hr went up to 50-60's, asymptomatic. NP Abigail notified, no new order.

## 2022-07-23 NOTE — Progress Notes (Signed)
Critical result of Hb 6.8, NP Abigail notified. 1 unit of blood transfused.

## 2022-07-23 NOTE — Evaluation (Signed)
Physical Therapy Evaluation Patient Details Name: Jean Mcclain MRN: [id number] DOB: 11/25/1945 Today's Date: 07/23/2022  History of Present Illness  76 y/o female presented to ED on 07/21/22 for blurry vision x 2 weeks and hypotension. Found to have diverticulitis with abscess. PMH: ESRD on HD, T2DM, hx of DVT, Parkinson's  Clinical Impression  Patient admitted with the above. PTA, patient living at [facility] and requires assistance for transfers and all ADLs. Patient has a goal of being able to walk and is currently working with therapy. Patient presents with weakness, impaired balance, and decreased activity tolerance. Patient required modA for bed mobility and minA for sit to stand and step pivot transfer with RW. Requires brace on L LE for all mobility. VSS on RA. Patient will benefit from skilled PT services during acute stay to address listed deficits. Recommend return to SNF at discharge to maximize functional mobility and safety.      Recommendations for follow up therapy are one component of a multi-disciplinary discharge planning process, led by the attending physician.  Recommendations may be updated based on patient status, additional functional criteria and insurance authorization.  Follow Up Recommendations Skilled nursing-short term rehab (<3 hours/day) Can patient physically be transported by private vehicle: No    Assistance Recommended at Discharge Frequent or constant Supervision/Assistance  Patient can return home with the following       Equipment Recommendations None recommended by PT  Recommendations for Other Services       Functional Status Assessment Patient has had a recent decline in their functional status and demonstrates the ability to make significant improvements in function in a reasonable and predictable amount of time.     Precautions / Restrictions Precautions Precautions: Fall Precaution Comments: L LE brace required for mobility Restrictions Weight  Bearing Restrictions: No      Mobility  Bed Mobility Overal bed mobility: Needs Assistance Bed Mobility: Supine to Sit     Supine to sit: Mod assist     General bed mobility comments: assist for trunk elevation and repositioning hips towards EOB    Transfers Overall transfer level: Needs assistance Equipment used: Rolling [place] [employer] (2 wheels) Transfers: Sit to/from Stand, Bed to chair/wheelchair/BSC Sit to Stand: Min assist   Step pivot transfers: Min assist       General transfer comment: assist for boost into standing and able to take steps to [facility] then to recliner with assist for balance    Ambulation/Gait                  Stairs            Wheelchair Mobility    Modified Rankin (Stroke Patients Only)       Balance Overall balance assessment: Needs assistance Sitting-balance support: No upper extremity supported Sitting balance-Leahy Scale: Fair     Standing balance support: Bilateral upper extremity supported, Reliant on assistive device for balance Standing balance-Leahy Scale: Poor Standing balance comment: reliant on UE support                             Pertinent Vitals/Pain Pain Assessment Pain Assessment: Faces Faces Pain Scale: Hurts a little bit Pain Location: generalized Pain Descriptors / Indicators: Discomfort Pain Intervention(s): Monitored during session    Home Living Family/patient expects to be discharged to:: Skilled nursing facility  Prior Function Prior Level of Function : Needs assist             Mobility Comments: performing step pivot transfer with assistance from staff ADLs Comments: pt requires assistance with bathing, dressing, toileting     Hand Dominance        Extremity/Trunk Assessment   Upper Extremity Assessment Upper Extremity Assessment: Defer to OT evaluation    Lower Extremity Assessment Lower Extremity Assessment: Generalized weakness     Cervical / Trunk Assessment Cervical / Trunk Assessment: Kyphotic  Communication   Communication: No difficulties  Cognition Arousal/Alertness: Awake/alert Behavior During Therapy: WFL for tasks assessed/performed Overall Cognitive Status: No family/caregiver present to determine baseline cognitive functioning                                 General Comments: seems at baseline cognitively. Overall [facility]        General Comments General comments (skin integrity, edema, etc.): VSS on RA    Exercises     Assessment/Plan    PT Assessment Patient needs continued PT services  PT Problem List Decreased strength;Decreased activity tolerance;Decreased balance;Decreased mobility       PT Treatment Interventions DME instruction;Gait training;Functional mobility training;Therapeutic activities;Therapeutic exercise;Balance training;Patient/family education    PT Goals (Current goals can be found in the Care Plan section)  Acute Rehab PT Goals Patient Stated Goal: to back to rehab PT Goal Formulation: With patient Time For Goal Achievement: 08/06/22 Potential to Achieve Goals: Fair    Frequency Min 2X/week     Co-evaluation               AM-PAC PT "6 Clicks" Mobility  Outcome Measure Help needed turning from your back to your side while in a flat bed without using bedrails?: A Lot Help needed moving from lying on your back to sitting on the side of a flat bed without using bedrails?: A Lot Help needed moving to and from a bed to a chair (including a wheelchair)?: A Little Help needed standing up from a chair using your arms (e.g., wheelchair or bedside chair)?: A Little Help needed to walk in hospital room?: A Lot Help needed climbing 3-5 steps with a railing? : Total 6 Click Score: 13    End of Session   Activity Tolerance: Patient tolerated treatment well Patient left: in chair;with call bell/phone within reach;with chair alarm set Nurse Communication:  Mobility status PT Visit Diagnosis: Unsteadiness on feet (R26.81);Muscle weakness (generalized) (M62.81);Difficulty in walking, not elsewhere classified (R26.2)    Time: 7741-2878 PT Time Calculation (min) (ACUTE ONLY): 27 min   Charges:   PT Evaluation $PT Eval Moderate Complexity: 1 Mod          Icelyn Navarrete A. Gilford Rile PT, DPT Acute Rehabilitation Services Office [phone]   Linna Hoff 07/23/2022, 10:31 AM

## 2022-07-24 ENCOUNTER — Inpatient Hospital Stay (HOSPITAL_COMMUNITY): Payer: HMO

## 2022-07-24 DIAGNOSIS — N186 End stage renal disease: Secondary | ICD-10-CM | POA: Diagnosis not present

## 2022-07-24 DIAGNOSIS — D631 Anemia in chronic kidney disease: Secondary | ICD-10-CM | POA: Diagnosis not present

## 2022-07-24 DIAGNOSIS — K5792 Diverticulitis of intestine, part unspecified, without perforation or abscess without bleeding: Secondary | ICD-10-CM | POA: Diagnosis not present

## 2022-07-24 DIAGNOSIS — E119 Type 2 diabetes mellitus without complications: Secondary | ICD-10-CM | POA: Diagnosis not present

## 2022-07-24 LAB — BASIC METABOLIC PANEL
Anion gap: 11 (ref 5–15)
BUN: 11 mg/dL (ref 8–23)
CO2: 28 mmol/L (ref 22–32)
Calcium: 8.4 mg/dL — ABNORMAL LOW (ref 8.9–10.3)
Chloride: 96 mmol/L — ABNORMAL LOW (ref 98–111)
Creatinine, Ser: 3.43 mg/dL — ABNORMAL HIGH (ref 0.44–1.00)
GFR, Estimated: 13 mL/min — ABNORMAL LOW (ref >60)
Glucose, Bld: 96 mg/dL (ref 70–99)
Potassium: 3 mmol/L — ABNORMAL LOW (ref 3.5–5.1)
Sodium: 135 mmol/L (ref 135–145)

## 2022-07-24 LAB — CBC WITH DIFFERENTIAL/PLATELET
Abs Immature Granulocytes: 0.07 10*3/uL (ref 0.00–0.07)
Basophils Absolute: 0 10*3/uL (ref 0.0–0.1)
Basophils Relative: 0 %
Eosinophils Absolute: 0.3 10*3/uL (ref 0.0–0.5)
Eosinophils Relative: 3 %
HCT: 28 % — ABNORMAL LOW (ref 36.0–46.0)
Hemoglobin: 8.9 g/dL — ABNORMAL LOW (ref 12.0–15.0)
Immature Granulocytes: 1 %
Lymphocytes Relative: 11 %
Lymphs Abs: 1 10*3/uL (ref 0.7–4.0)
MCH: 27.1 pg (ref 26.0–34.0)
MCHC: 31.8 g/dL (ref 30.0–36.0)
MCV: 85.4 fL (ref 80.0–100.0)
Monocytes Absolute: 0.5 10*3/uL (ref 0.1–1.0)
Monocytes Relative: 6 %
Neutro Abs: 6.5 10*3/uL (ref 1.7–7.7)
Neutrophils Relative %: 79 %
Platelets: 184 10*3/uL (ref 150–400)
RBC: 3.28 MIL/uL — ABNORMAL LOW (ref 3.87–5.11)
RDW: 20.8 % — ABNORMAL HIGH (ref 11.5–15.5)
WBC: 8.3 10*3/uL (ref 4.0–10.5)
nRBC: 0 % (ref 0.0–0.2)

## 2022-07-24 LAB — TYPE AND SCREEN
ABO/RH(D): O POS
Antibody Screen: NEGATIVE
Unit division: 0

## 2022-07-24 LAB — BPAM RBC
Blood Product Expiration Date: 202311132359
ISSUE DATE / TIME: 202310140350
Unit Type and Rh: 5100

## 2022-07-24 LAB — MAGNESIUM: Magnesium: 2.1 mg/dL (ref 1.7–2.4)

## 2022-07-24 LAB — T3: T3, Total: 78 ng/dL (ref 71–180)

## 2022-07-24 LAB — GLUCOSE, CAPILLARY
Glucose-Capillary: 102 mg/dL — ABNORMAL HIGH (ref 70–99)
Glucose-Capillary: 109 mg/dL — ABNORMAL HIGH (ref 70–99)
Glucose-Capillary: 114 mg/dL — ABNORMAL HIGH (ref 70–99)
Glucose-Capillary: 117 mg/dL — ABNORMAL HIGH (ref 70–99)
Glucose-Capillary: 125 mg/dL — ABNORMAL HIGH (ref 70–99)
Glucose-Capillary: 91 mg/dL (ref 70–99)
Glucose-Capillary: 98 mg/dL (ref 70–99)

## 2022-07-24 LAB — BRAIN NATRIURETIC PEPTIDE: B Natriuretic Peptide: 515.4 pg/mL — ABNORMAL HIGH (ref 0.0–100.0)

## 2022-07-24 MED ORDER — MAGNESIUM HYDROXIDE 400 MG/5ML PO SUSP
30.0000 mL | Freq: Once | ORAL | Status: DC
  Filled 2022-07-24: qty 30

## 2022-07-24 MED ORDER — CALCIUM CARBONATE ANTACID 500 MG PO CHEW
1.0000 | CHEWABLE_TABLET | Freq: Once | ORAL | Status: AC
  Administered 2022-07-24: 200 mg via ORAL
  Filled 2022-07-24: qty 1

## 2022-07-24 MED ORDER — POTASSIUM CHLORIDE CRYS ER 20 MEQ PO TBCR: 40.0000 meq | EXTENDED_RELEASE_TABLET | Freq: Two times a day (BID) | ORAL | Status: DC

## 2022-07-24 MED ORDER — POTASSIUM CHLORIDE CRYS ER 20 MEQ PO TBCR
20.0000 meq | EXTENDED_RELEASE_TABLET | Freq: Once | ORAL | Status: AC
  Administered 2022-07-24: 20 meq via ORAL
  Filled 2022-07-24: qty 1

## 2022-07-24 MED ORDER — DOCUSATE SODIUM 100 MG PO CAPS
200.0000 mg | ORAL_CAPSULE | Freq: Two times a day (BID) | ORAL | Status: DC
  Administered 2022-07-24 – 2022-07-27 (×6): 200 mg via ORAL
  Filled 2022-07-24 (×7): qty 2

## 2022-07-24 MED ORDER — POLYETHYLENE GLYCOL 3350 17 G PO PACK
17.0000 g | PACK | Freq: Two times a day (BID) | ORAL | Status: DC
  Administered 2022-07-24 – 2022-07-27 (×3): 17 g via ORAL
  Filled 2022-07-24 (×7): qty 1

## 2022-07-24 MED ORDER — CHLORHEXIDINE GLUCONATE CLOTH 2 % EX PADS
6.0000 | MEDICATED_PAD | Freq: Every day | CUTANEOUS | Status: DC
  Administered 2022-07-25 – 2022-07-28 (×4): 6 via TOPICAL

## 2022-07-24 MED ORDER — GLYCERIN (LAXATIVE) 2 G RE SUPP
1.0000 | Freq: Every day | RECTAL | Status: DC | PRN
  Filled 2022-07-24: qty 1

## 2022-07-24 NOTE — Progress Notes (Signed)
PROGRESS NOTE                                                                                                                                                                                                             Patient Demographics:    Jean Mcclain, is a 76 y.o. female, DOB - 11/08/1945, MPN:[id number]  Outpatient Primary MD for the patient is Pcp, No    LOS - 2  Admit date - 07/21/2022    Chief Complaint  Patient presents with   Hypotension   Blurred Vision       Brief Narrative (HPI from H&P)   76 y.o. female with medical history significant of ESRD on HD, full treatment yesterday, DM2, prior DVT on eliquis, Parkinson's, chronic hypotension on midodrine on her dialysis days, who lives at an SNF and has been having blurry vision for the last several months was brought in after she became severely hypotensive with blurry vision, systolic blood pressure was in 70s she was brought to the ER where work-up showed asymptomatic diverticular abscess however she had significant drop in her hemoglobin from a baseline of about 12-8, no known blood in stool or melena, initial Hemoccult negative.   Subjective:   Patient in bed denies any headache or chest pain, no abdominal pain but does have some nausea and feels constipated.  No focal weakness.   Assessment  & Plan :    Symptomatic hypotension and anemia in a patient with incidental finding of diverticular abscess, underlying history of anemia of chronic disease and ESRD. She is surprisingly pain and fever free, question the diagnosis of diverticular abscess, seen by general surgery and currently on clear liquid diet and IV antibiotics.  Due to her anemia she received 1 unit of packed RBC transfusion on 07/23/2022, stable posttransfusion H&H, LDH haptoglobin stable.  No known GI bleed however due to her diverticular abscess could have had some intermittent occult blood loss.   Continue to monitor.  General surgery on board.  Acute anemia of unclear etiology in a patient with anemia of chronic disease.  Currently see #1 above.  Ongoing blurry vision for several months.  MRI brain negative, probably got worsened due to hypotension, now close to baseline, outpatient ophthalmology follow-up.  ESRD.  On MWF schedule.  Nephrology consulted.  For mild hypokalemia given 20 mEq of  potassium on 07/24/2022.  Hypotension.  Scheduled midodrine on the days of dialysis, as needed otherwise.  Stable cortisol, slightly suppressed TSH stable echocardiogram with preserved EF of 60%, no wall motion abnormalities or significant valvular problems.  Mildly suppressed TSH.  Could be sick euthyroid, check free T4 and T3.  Kidney lesion - Mildly increased in size since 2021. Consider follow up MRI abd in 6 months arranged by PCP.  HX of Parkinson's.  Continue home medications.    History of DVT.  Patient thinks it several months ago.  Negative venous duplex now this admission, hold anticoagulation for now due to severe anemia.  We will have her address with PCP for long-term anticoagulation.  Constipation.  Placed on bowel regimen.    Insulin-requiring or dependent type II diabetes mellitus ([place]) - Very sensitive SSI Q4H for the moment.  Lab Results  Component Value Date   HGBA1C 6.9 (H) 07/22/2022   CBG (last 3)  Recent Labs    07/24/22 0000 07/24/22 0417 07/24/22 0738  GLUCAP 102* 98 109*        Condition - Extremely Guarded  Family Communication  :  None  Code Status :  DNR  Consults  :  CCS, Renal, GI  PUD Prophylaxis : PPI   Procedures  :     Leg [place] - No evidence of deep vein thrombosis seen in the lower extremities, bilaterally. -No evidence of popliteal cyst, bilaterally.    TTE - 1. Left ventricular ejection fraction, by estimation, is 60 to 65%. The left ventricle has normal function. The left ventricle has no regional wall motion abnormalities. There is  mild left ventricular hypertrophy. Left ventricular diastolic parameters are consistent with Grade I diastolic dysfunction (impaired relaxation).  2. Right ventricular systolic function is normal. The right ventricular size is normal. Tricuspid regurgitation signal is inadequate for assessing PA pressure.  3. Left atrial size was moderately dilated.  4. Right atrial size was mildly dilated.  5. Trivial mitral valve regurgitation.  6. There is moderate calcification of the aortic valve. Aortic valve regurgitation is not visualized. Mild aortic valve stenosis.  7. The inferior vena cava is normal in size with greater than 50% respiratory variability, suggesting right atrial pressure of 3 mmHg.  MRI - 1. No acute intracranial abnormality. 2. Findings of chronic small vessel ischemia  CT -  Acute sigmoid diverticulitis with suspected 2.5 cm intramural abscess. No free air. 16 mm hyperdense lesion in the anterior right upper kidney, mildly increased from 2021. Differential considerations include benign hemorrhagic cyst versus small papillary renal cell carcinoma. Consider follow-up MRI abdomen with/without contrast in 6 months. Additional ancillary findings as above      Disposition Plan  :    Status is: Observation  DVT Prophylaxis  :    Place and maintain sequential compression device Start: 07/22/22 0553   Lab Results  Component Value Date   PLT 184 07/24/2022    Diet :  Diet Order             Diet clear liquid Room service appropriate? Yes; Fluid consistency: Thin  Diet effective now                    Inpatient Medications  Scheduled Meds:  sodium chloride   Intravenous Once   atorvastatin  20 mg Oral Daily   [START ON 07/27/2022] buprenorphine  1 patch Transdermal Weekly   carbidopa-levodopa  1 tablet Oral BID   carbidopa-levodopa  2 tablet  Oral q AM   Chlorhexidine Gluconate Cloth  6 each Topical Q0600   docusate sodium  200 mg Oral BID   doxercalciferol  2 mcg  Intravenous Q M,W,F-HD   fluticasone  1 spray Each Nare BID   gabapentin  300 mg Oral QHS   insulin aspart  0-6 Units Subcutaneous Q4H   magnesium hydroxide  30 mL Oral Once   melatonin  5 mg Oral QHS   midodrine  20 mg Oral Q M,W,F-HD   mirtazapine  15 mg Oral QHS   nystatin  1 Application Topical G8676   pantoprazole (PROTONIX) IV  40 mg Intravenous Q12H   polyethylene glycol  17 g Oral BID   rOPINIRole  12 mg Oral Daily   tamsulosin  0.4 mg Oral QPC supper   Continuous Infusions:  cefTRIAXone (ROCEPHIN)  IV 2 g (07/23/22 2017)   metronidazole 500 mg (07/24/22 0917)   PRN Meds:.acetaminophen **OR** acetaminophen, albuterol, camphor-menthol, Glycerin (Adult), hydrOXYzine, midodrine, [DISCONTINUED] ondansetron **OR** ondansetron (ZOFRAN) IV, simethicone  Antibiotics  :    Anti-infectives (From admission, onward)    Start     Dose/Rate Route Frequency Ordered Stop   07/22/22 0015  cefTRIAXone (ROCEPHIN) 2 g in sodium chloride 0.9 % 100 mL IVPB        2 g 200 mL/hr over 30 Minutes Intravenous Daily at 10 pm 07/22/22 0006     07/22/22 0015  metroNIDAZOLE (FLAGYL) IVPB 500 mg        500 mg 100 mL/hr over 60 Minutes Intravenous 2 times daily 07/22/22 0006     07/22/22 0000  piperacillin-tazobactam (ZOSYN) IVPB 3.375 g  Status:  Discontinued        3.375 g 100 mL/hr over 30 Minutes Intravenous  Once 07/21/22 2349 07/22/22 0006         Objective:   Vitals:   07/23/22 1600 07/23/22 1942 07/24/22 0420 07/24/22 0748  BP: (!) 101/50 (!) 140/58 (!) 139/59 (!) 131/51  Pulse: (!) 49 61 (!) 53 60  Resp: '16 18 17 18  '$ Temp: 97.9 F (36.6 C) 99.1 F (37.3 C) 98.2 F (36.8 C) 98 F (36.7 C)  TempSrc: Oral Oral Oral Oral  SpO2: 100% 97% 99% 98%  Weight:      Height:        Wt Readings from Last 3 Encounters:  07/22/22 70.2 kg  06/21/22 73.9 kg  02/11/22 80.4 kg     Intake/Output Summary (Last 24 hours) at 07/24/2022 1004 Last data filed at 07/23/2022 2017 Gross per 24  hour  Intake 250 ml  Output --  Net 250 ml     Physical Exam  Awake Alert, No new F.N deficits, Normal affect .AT,PERRAL Supple Neck, No JVD,   Symmetrical Chest wall movement, Good air movement bilaterally, CTAB RRR,No Gallops, Rubs or new Murmurs,  +ve B.Sounds, Abd Soft, mild left lower quadrant tenderness today 1 + edema         Data Review:    CBC Recent Labs  Lab 07/21/22 1538 07/22/22 0438 07/23/22 0225 07/23/22 0938 07/23/22 0950 07/24/22 0337  WBC 11.1* 9.2 9.7  --   --  8.3  HGB 7.8* 7.1* 6.8*  --  8.9* 8.9*  HCT 26.3* 24.0* 22.8*  --  27.8* 28.0*  PLT 241 231 200 170  --  184  MCV 87.4 87.0 85.7  --   --  85.4  MCH 25.9* 25.7* 25.6*  --   --  27.1  MCHC 29.7* 29.6*  29.8*  --   --  31.8  RDW 24.0* 24.0* 23.9*  --   --  20.8*  LYMPHSABS 0.7  --  0.8  --   --  1.0  MONOABS 0.5  --  0.5  --   --  0.5  EOSABS 0.1  --  0.2  --   --  0.3  BASOSABS 0.0  --  0.0  --   --  0.0    Electrolytes Recent Labs  Lab 07/21/22 1538 07/21/22 1838 07/22/22 0438 07/22/22 1716 07/23/22 0225 07/23/22 0938 07/24/22 0337  NA 138  --  138  --  133*  --  135  K 3.7  --  3.7  --  3.3*  --  3.0*  CL 97*  --  101  --  99  --  96*  CO2 30  --  30  --  27  --  28  GLUCOSE 135*  --  105*  --  121*  --  96  BUN 9  --  11  --  6*  --  11  CREATININE 3.43*  --  3.71*  --  2.37*  --  3.43*  CALCIUM 8.4*  --  8.0*  --  7.8*  --  8.4*  AST 9*  --   --   --   --   --   --   ALT <5  --   --   --   --   --   --   ALKPHOS 102  --   --   --   --   --   --   BILITOT 0.5  --   --   --   --   --   --   ALBUMIN 2.9*  --   --   --   --   --   --   MG  --   --   --   --  1.4*  --  2.1  DDIMER  --   --   --   --   --  0.59*  --   INR  --   --   --   --   --  1.3*  --   TSH  --  0.241*  --  0.212*  --  0.291*  --   HGBA1C  --   --  6.9*  --   --   --   --   BNP  --   --   --   --  401.3*  --  515.4*     ------------------------------------------------------------------------------------------------------------------ No results for input(s): "CHOL", "HDL", "LDLCALC", "TRIG", "CHOLHDL", "LDLDIRECT" in the last 72 hours.  Lab Results  Component Value Date   HGBA1C 6.9 (H) 07/22/2022    Recent Labs    07/23/22 0938  TSH 0.291*   ------------------------------------------------------------------------------------------------------------------ ID Labs Recent Labs  Lab 07/21/22 1538 07/22/22 0438 07/23/22 0225 07/23/22 0938 07/24/22 0337  WBC 11.1* 9.2 9.7  --  8.3  PLT 241 231 200 170 184  DDIMER  --   --   --  0.59*  --   CREATININE 3.43* 3.71* 2.37*  --  3.43*   Cardiac Enzymes No results for input(s): "CKMB", "TROPONINI", "MYOGLOBIN" in the last 168 hours.  Invalid input(s): "CK"    Micro Results No results found for this or any previous visit (from the past 240 hour(s)).  Radiology Reports DG Abd Portable 1V  Result Date: 07/24/2022 CLINICAL DATA:  76 year old  female with nausea. Sigmoid diverticulitis with suspected abscess. EXAM: PORTABLE ABDOMEN - 1 VIEW COMPARISON:  Recent CT Abdomen and Pelvis 07/21/2022. FINDINGS: Portable AP supine views at 0820 hours. Negative lung bases. Nonobstructed bowel gas pattern, with a greater paucity of bowel gas compared to 07/21/2022. No pneumoperitoneum is evident on these supine views. Dystrophic pelvic calcifications redemonstrated. Severe right hip joint degeneration. No acute osseous abnormality identified. IMPRESSION: Nonobstructed bowel-gas pattern with no pneumoperitoneum evident on these supine images. Electronically Signed   By: Genevie Ann M.D.   On: 07/24/2022 08:50   VAS [place] LOWER EXTREMITY VENOUS (DVT)  Result Date: 07/22/2022  Lower Venous DVT Study Patient Name:  SHATONIA HOOTS  Date of Exam:   07/22/2022 Medical Rec #: [id number]         Accession #:    [id number] Date of Birth: Jun 11, 1946          Patient Gender: F  Patient Age:   51 years Exam Location:  [facility] Procedure:      VAS [place] LOWER EXTREMITY VENOUS (DVT) Referring Phys: Deno Etienne [facility] --------------------------------------------------------------------------------  Indications: Swelling.  Risk Factors: LLE DVT 2018. Anticoagulation: Eliquis. Limitations: Poor ultrasound/tissue interface. Comparison Study: Previous exam on 10/10/19 was negative for DVT. Performing Technologist: Rogelia Rohrer RVT, RDMS  Examination Guidelines: A complete evaluation includes B-mode imaging, spectral Doppler, color Doppler, and power Doppler as needed of all accessible portions of each vessel. Bilateral testing is considered an integral part of a complete examination. Limited examinations for reoccurring indications may be performed as noted. The reflux portion of the exam is performed with the patient in reverse Trendelenburg.  +---------+---------------+---------+-----------+----------+-------------------+ RIGHT    CompressibilityPhasicitySpontaneityPropertiesThrombus Aging      +---------+---------------+---------+-----------+----------+-------------------+ CFV      Full           Yes      Yes                                      +---------+---------------+---------+-----------+----------+-------------------+ SFJ      Full                                                             +---------+---------------+---------+-----------+----------+-------------------+ FV Prox  Full           Yes      Yes                                      +---------+---------------+---------+-----------+----------+-------------------+ FV Mid   Full           Yes      Yes                                      +---------+---------------+---------+-----------+----------+-------------------+ FV DistalFull           Yes      Yes                                      +---------+---------------+---------+-----------+----------+-------------------+  PFV      Full                                                              +---------+---------------+---------+-----------+----------+-------------------+ POP      Full           Yes      Yes                                      +---------+---------------+---------+-----------+----------+-------------------+ PTV                                                   Not well visualized +---------+---------------+---------+-----------+----------+-------------------+ PERO                                                  Not well visualized +---------+---------------+---------+-----------+----------+-------------------+   +---------+---------------+---------+-----------+----------+-------------------+ LEFT     CompressibilityPhasicitySpontaneityPropertiesThrombus Aging      +---------+---------------+---------+-----------+----------+-------------------+ CFV      Full           Yes      Yes                                      +---------+---------------+---------+-----------+----------+-------------------+ SFJ      Full                                                             +---------+---------------+---------+-----------+----------+-------------------+ FV Prox  Full           Yes      Yes                                      +---------+---------------+---------+-----------+----------+-------------------+ FV Mid   Full           Yes      Yes                                      +---------+---------------+---------+-----------+----------+-------------------+ FV DistalFull           Yes      Yes                                      +---------+---------------+---------+-----------+----------+-------------------+ PFV      Full                                                             +---------+---------------+---------+-----------+----------+-------------------+  POP      Full           Yes      Yes                                       +---------+---------------+---------+-----------+----------+-------------------+ PTV      Full                                         Not well visualized +---------+---------------+---------+-----------+----------+-------------------+ PERO     Full                                         Not well visualized +---------+---------------+---------+-----------+----------+-------------------+     Summary: BILATERAL: - No evidence of deep vein thrombosis seen in the lower extremities, bilaterally. -No evidence of popliteal cyst, bilaterally.   *See table(s) above for measurements and observations. Electronically signed by Monica Martinez MD on 07/22/2022 at 1:40:37 PM.    Final    ECHOCARDIOGRAM COMPLETE  Result Date: 07/22/2022    ECHOCARDIOGRAM REPORT   Patient Name:   ALEXSIS KATHMAN Date of Exam: 07/22/2022 Medical Rec #:  [id number]        Height:       60.0 in Accession #:    [id number]       Weight:       163.0 lb Date of Birth:  [birth year]         BSA:          2.711 m Patient Age:    22 years         BP:           120/49 mmHg Patient Gender: F                HR:           66 bpm. Exam Location:  Inpatient Procedure: 2D Echo, Cardiac Doppler and Color Doppler Indications:    Syncope R55  History:        Patient has prior history of Echocardiogram examinations, most                 recent 03/11/2017. Risk Factors:Hypertension, Sleep Apnea,                 Diabetes and Dyslipidemia.  Sonographer:    Ronny Flurry Referring Phys: [place]  1. Left ventricular ejection fraction, by estimation, is 60 to 65%. The left ventricle has normal function. The left ventricle has no regional wall motion abnormalities. There is mild left ventricular hypertrophy. Left ventricular diastolic parameters are consistent with Grade I diastolic dysfunction (impaired relaxation).  2. Right ventricular systolic function is normal. The right ventricular size is normal. Tricuspid regurgitation  signal is inadequate for assessing PA pressure.  3. Left atrial size was moderately dilated.  4. Right atrial size was mildly dilated.  5. Trivial mitral valve regurgitation.  6. There is moderate calcification of the aortic valve. Aortic valve regurgitation is not visualized. Mild aortic valve stenosis.  7. The inferior vena cava is normal in size with greater than 50% respiratory variability, suggesting right atrial pressure of 3 mmHg. FINDINGS  Left Ventricle: Left ventricular ejection fraction, by  estimation, is 60 to 65%. The left ventricle has normal function. The left ventricle has no regional wall motion abnormalities. The left ventricular internal cavity size was normal in size. There is  mild left ventricular hypertrophy. Left ventricular diastolic parameters are consistent with Grade I diastolic dysfunction (impaired relaxation). Right Ventricle: The right ventricular size is normal. No increase in right ventricular wall thickness. Right ventricular systolic function is normal. Tricuspid regurgitation signal is inadequate for assessing PA pressure. Left Atrium: Left atrial size was moderately dilated. Right Atrium: Right atrial size was mildly dilated. Pericardium: There is no evidence of pericardial effusion. Mitral Valve: There is severe calcification of the mitral valve leaflet(s). Trivial mitral valve regurgitation. MV peak gradient, 9.7 mmHg. The mean mitral valve gradient is 4.5 mmHg. Tricuspid Valve: The tricuspid valve is normal in structure. Tricuspid valve regurgitation is not demonstrated. Aortic Valve: There is moderate calcification of the aortic valve. Aortic valve regurgitation is not visualized. Mild aortic stenosis is present. Aortic valve mean gradient measures 11.0 mmHg. Aortic valve peak gradient measures 18.5 mmHg. Aortic valve area, by VTI measures 2.14 cm. Pulmonic Valve: The pulmonic valve was grossly normal. Pulmonic valve regurgitation is not visualized. Aorta: The aortic root  and ascending aorta are structurally normal, with no evidence of dilitation. Venous: The inferior vena cava is normal in size with greater than 50% respiratory variability, suggesting right atrial pressure of 3 mmHg. IAS/Shunts: No atrial level shunt detected by color flow Doppler.  LEFT VENTRICLE PLAX 2D LVIDd:         3.80 cm   Diastology LVIDs:         2.60 cm   LV e' medial:    6.09 cm/s LV PW:         1.10 cm   LV E/e' medial:  20.2 LV IVS:        1.20 cm   LV e' lateral:   6.64 cm/s LVOT diam:     2.10 cm   LV E/e' lateral: 18.5 LV SV:         113 LV SV Index:   66 LVOT Area:     3.46 cm  RIGHT VENTRICLE             IVC RV S prime:     11.00 cm/s  IVC diam: 1.60 cm TAPSE (M-mode): 2.4 cm LEFT ATRIUM             Index        RIGHT ATRIUM           Index LA diam:        4.00 cm 2.34 cm/m   RA Area:     10.70 cm LA Vol (A2C):   84.2 ml 49.21 ml/m  RA Volume:   20.60 ml  12.04 ml/m LA Vol (A4C):   51.9 ml 30.33 ml/m LA Biplane Vol: 72.5 ml 42.37 ml/m  AORTIC VALVE AV Area (Vmax):    1.85 cm AV Area (Vmean):   1.99 cm AV Area (VTI):     2.14 cm AV Vmax:           215.00 cm/s AV Vmean:          160.000 cm/s AV VTI:            0.527 m AV Peak Grad:      18.5 mmHg AV Mean Grad:      11.0 mmHg LVOT Vmax:         115.00 cm/s LVOT Vmean:  91.900 cm/s LVOT VTI:          0.326 m LVOT/AV VTI ratio: 0.62  AORTA Ao Root diam: 3.20 cm Ao Asc diam:  3.30 cm MITRAL VALVE MV Area (PHT): 2.73 cm     SHUNTS MV Area VTI:   2.06 cm     Systemic VTI:  0.33 m MV Peak grad:  9.7 mmHg     Systemic Diam: 2.10 cm MV Mean grad:  4.5 mmHg MV Vmax:       1.56 m/s MV Vmean:      99.9 cm/s MV Decel Time: 278 msec MR Peak grad: 63.5 mmHg MR Vmax:      398.50 cm/s MV E velocity: 123.00 cm/s MV A velocity: 143.00 cm/s MV E/A ratio:  0.86 Landscape architect signed by Phineas Inches Signature Date/Time: 07/22/2022/10:32:06 AM    Final    CT ABDOMEN PELVIS W CONTRAST  Result Date: 07/21/2022 CLINICAL DATA:  Abdominal pain,  nausea/vomiting EXAM: CT ABDOMEN AND PELVIS WITH CONTRAST TECHNIQUE: Multidetector CT imaging of the abdomen and pelvis was performed using the standard protocol following bolus administration of intravenous contrast. RADIATION DOSE REDUCTION: This exam was performed according to the departmental dose-optimization program which includes automated exposure control, adjustment of the mA and/or kV according to patient size and/or use of iterative reconstruction technique. CONTRAST:  67m OMNIPAQUE IOHEXOL 350 MG/ML SOLN COMPARISON:  01/09/2022 and 07/28/2020 FINDINGS: Lower chest: Lung bases are clear. Hepatobiliary: Liver is within normal limits. Gallbladder is unremarkable. No intrahepatic or extrahepatic duct dilatation. Pancreas: Within normal limits. Spleen: Within normal limits. Adrenals/Urinary Tract: Right adrenal gland is within normal limits. At least 2 left adrenal nodules measuring up to 10 mm (series 3/image 22), unchanged from 2021, favoring benign adrenal adenomas. Left renal cortical atrophy. 10 mm right lower pole simple cyst (series 3/image 38), benign (Bosniak I). However, there is a 16 mm hyperdense lesion in the anterior right upper kidney (series 3/image 32), similar to the most recent prior but previously measuring 9 mm in 2021. Differential considerations include benign hemorrhagic cyst versus small papillary renal cell carcinoma. No hydronephrosis. Mildly thick-walled bladder, although underdistended. Stomach/Bowel: Stomach is within normal limits. No evidence of bowel obstruction. Normal appendix (series 3/image 60). Sigmoid diverticulosis with mild pericolonic inflammatory changes in the left lower quadrant, suggesting sigmoid diverticulitis, and a suspected 1.5 x 2.5 cm intramural abscess (series 3/image 61). No free air. Vascular/Lymphatic: No evidence of abdominal aortic aneurysm. Atherosclerotic calcifications of the abdominal aorta and branch vessels. No suspicious abdominopelvic  lymphadenopathy. Reproductive: Uterus is within normal limits. Bilateral ovaries are grossly unremarkable. Other: No abdominopelvic ascites. Musculoskeletal: Degenerative changes of the visualized thoracolumbar spine. IMPRESSION: Acute sigmoid diverticulitis with suspected 2.5 cm intramural abscess. No free air. 16 mm hyperdense lesion in the anterior right upper kidney, mildly increased from 2021. Differential considerations include benign hemorrhagic cyst versus small papillary renal cell carcinoma. Consider follow-up MRI abdomen with/without contrast in 6 months. Additional ancillary findings as above. Electronically Signed   By: SJulian HyM.D.   On: 07/21/2022 23:43   MR BRAIN WO CONTRAST  Result Date: 07/21/2022 CLINICAL DATA:  [place] EXAM: MRI HEAD WITHOUT CONTRAST TECHNIQUE: Multiplanar, multiecho pulse sequences of the brain and surrounding structures were obtained without intravenous contrast. COMPARISON:  None Available. FINDINGS: Brain: No acute infarct, mass effect or extra-axial collection. No acute or chronic hemorrhage. There is multifocal hyperintense T2-weighted signal within the white matter. Parenchymal volume and CSF spaces are normal. The midline structures are  normal. Vascular: Major flow voids are preserved. Skull and upper cervical spine: Normal calvarium and skull base. Visualized upper cervical spine and soft tissues are normal. Sinuses/Orbits:No paranasal sinus fluid levels or advanced mucosal thickening. No mastoid or middle ear effusion. Normal orbits. IMPRESSION: 1. No acute intracranial abnormality. 2. Findings of chronic small vessel ischemia. Electronically Signed   By: Ulyses Jarred M.D.   On: 07/21/2022 22:13   DG Chest Portable 1 View  Result Date: 07/21/2022 CLINICAL DATA:  Hypoxia, hypertension EXAM: PORTABLE CHEST 1 VIEW COMPARISON:  Chest 01/09/2022 FINDINGS: Heart size upper normal. Vascularity normal. Lungs clear without infiltrate or effusion. Bilateral  shoulder subluxation appears chronic and unchanged. IMPRESSION: No active disease. Electronically Signed   By: Franchot Gallo M.D.   On: 07/21/2022 19:07      Signature  Lala Lund M.D on 07/24/2022 at 10:04 AM   -  To page go to [email or web address]

## 2022-07-24 NOTE — Plan of Care (Signed)
  Problem: Education: Goal: Ability to describe self-care measures that may prevent or decrease complications (Diabetes Survival Skills Education) will improve Outcome: Progressing   Problem: Coping: Goal: Ability to adjust to condition or change in health will improve Outcome: Progressing   

## 2022-07-24 NOTE — Progress Notes (Signed)
Patient ID: Jean Mcclain, female   DOB: 19-Dec-1945, 76 y.o.   MRN: [id number] [facility] Progress Note:   * No surgery found *   THE PLAN  MWF dialysis Constipation distal to diverticulitis-will give glycerine suppository  Subjective: Mental status is clear.  Complaints hard BMs. Objective: Vital signs in last 24 hours: Temp:  [97.9 F (36.6 C)-99.1 F (37.3 C)] 98 F (36.7 C) (10/15 0748) Pulse Rate:  [49-61] 60 (10/15 0748) Resp:  [16-18] 18 (10/15 0748) BP: (101-140)/(50-59) 131/51 (10/15 0748) SpO2:  [97 %-100 %] 98 % (10/15 0748)  Intake/Output from previous day: 10/14 0701 - 10/15 0700 In: 700 [Blood:450; IV Piggyback:250] Out: -  Intake/Output this shift: No intake/output data recorded.  Physical Exam: Work of breathing is normal.  No new lower abdominal pain.  Flatus-on clears  Lab Results:  Results for orders placed or performed during the hospital encounter of 07/21/22 (from the past 48 hour(s))  CBG monitoring, ED     Status: None   Collection Time: 07/22/22  9:00 AM  Result Value Ref Range   Glucose-Capillary 96 70 - 99 mg/dL    Comment: Glucose reference range applies only to samples taken after fasting for at least 8 hours.  CBG monitoring, ED     Status: None   Collection Time: 07/22/22 12:44 PM  Result Value Ref Range   Glucose-Capillary 89 70 - 99 mg/dL    Comment: Glucose reference range applies only to samples taken after fasting for at least 8 hours.  Vitamin B12     Status: None   Collection Time: 07/22/22  5:16 PM  Result Value Ref Range   Vitamin B-12 264 180 - 914 pg/mL    Comment: (NOTE) This assay is not validated for testing neonatal or myeloproliferative syndrome specimens for Vitamin B12 levels. Performed at [facility], [place] [address] [place], [place] [address]   Folate     Status: Abnormal   Collection Time: 07/22/22  5:16 PM  Result Value Ref Range   Folate 3.4 (L) >5.9 ng/mL    Comment: Performed at [facility], [place] [address] [place], [place] [address]  Iron and TIBC     Status: Abnormal   Collection Time: 07/22/22  5:16 PM  Result Value Ref Range   Iron 50 28 - 170 ug/dL   TIBC 130 (L) 250 - 450 ug/dL   Saturation Ratios 38 (H) 10.4 - 31.8 %   UIBC 80 ug/dL    Comment: Performed at [facility] [address], [place], [place] [address]  Ferritin     Status: Abnormal   Collection Time: 07/22/22  5:16 PM  Result Value Ref Range   Ferritin 883 (H) 11 - 307 ng/mL    Comment: Performed at [facility], [place] [address], [place], [place] [address]  Lactate dehydrogenase     Status: None   Collection Time: 07/22/22  5:16 PM  Result Value Ref Range   LDH 136 98 - 192 U/L    Comment: Performed at [facility], [place] [address] [place], [place] [address]  Haptoglobin     Status: None   Collection Time: 07/22/22  5:16 PM  Result Value Ref Range   Haptoglobin 142 42 - 346 mg/dL    Comment: (NOTE) Performed At: [facility] [place], [place] [id number] Rush Farmer MD [id number]   TSH     Status: Abnormal  Collection Time: 07/22/22  5:16 PM  Result Value Ref Range   TSH 0.212 (L) 0.350 - 4.500 uIU/mL    Comment: Performed by a 3rd Generation assay with a functional sensitivity of <=0.01 uIU/mL. Performed at [facility], [place] [address], [place], [place] [address]   Cortisol     Status: None   Collection Time: 07/22/22  5:16 PM  Result Value Ref Range   Cortisol, Plasma 12.3 ug/dL    Comment: (NOTE) AM    6.7 - 22.6 ug/dL PM   <10.0       ug/dL Performed at [facility] [address], [place], [place] [address]   Phosphorus     Status: Abnormal   Collection Time: 07/22/22  5:16 PM  Result Value Ref Range   Phosphorus 2.4 (L) 2.5 - 4.6 mg/dL    Comment: Performed at [facility] [address] [place], [place] [address]  Hepatitis B surface antigen     Status: None   Collection Time: 07/22/22  5:16 PM   Result Value Ref Range   Hepatitis B Surface Ag NON REACTIVE NON REACTIVE    Comment: Performed at [facility] [address], [place], [place] [address]  Hepatitis B surface antibody     Status: None   Collection Time: 07/22/22  5:16 PM  Result Value Ref Range   Hep B S Ab NON REACTIVE NON REACTIVE    Comment: (NOTE) Inconsistent with immunity, less than 10 mIU/mL.  Performed at [facility], [place] [address], [place], [place] [address]   Hepatitis B surface antibody,quantitative     Status: Abnormal   Collection Time: 07/22/22  5:16 PM  Result Value Ref Range   Hep B S AB Quant (Post) <3.1 (L) Immunity>9.9 mIU/mL    Comment: (NOTE)  Status of Immunity                     Anti-HBs Level  ------------------                     -------------- Inconsistent with Immunity                   0.0 - 9.9 Consistent with Immunity                          >9.9 Performed At: [facility] [address] [place], [place] [id number] Rush Farmer MD [id number]   Hepatitis B core antibody, total     Status: None   Collection Time: 07/22/22  5:16 PM  Result Value Ref Range   Hep B Core Total Ab NON REACTIVE NON REACTIVE    Comment: Performed at [facility] [place] [address] [place],  [address]  Hepatitis C antibody     Status: None   Collection Time: 07/22/22  5:16 PM  Result Value Ref Range   HCV Ab NON REACTIVE NON REACTIVE    Comment: (NOTE) Nonreactive HCV antibody screen is consistent with no HCV infections,  unless recent infection is suspected or other evidence exists to indicate HCV infection.  Performed at [facility], [place] [address] [place], [place] [address]   Glucose, capillary     Status: Abnormal   Collection Time: 07/22/22  9:10 PM  Result Value Ref Range   Glucose-Capillary 113 (H) 70 - 99 mg/dL    Comment: Glucose reference  range applies only to samples taken after fasting for at least 8 hours.  Glucose, capillary      Status: Abnormal   Collection Time: 07/23/22 12:43 AM  Result Value Ref Range   Glucose-Capillary 130 (H) 70 - 99 mg/dL    Comment: Glucose reference range applies only to samples taken after fasting for at least 8 hours.  Magnesium     Status: Abnormal   Collection Time: 07/23/22  2:25 AM  Result Value Ref Range   Magnesium 1.4 (L) 1.7 - 2.4 mg/dL    Comment: Performed at [facility] [address] [place], [place] [address]  CBC with Differential/Platelet     Status: Abnormal   Collection Time: 07/23/22  2:25 AM  Result Value Ref Range   WBC 9.7 4.0 - 10.5 K/uL   RBC 2.66 (L) 3.87 - 5.11 MIL/uL   Hemoglobin 6.8 (LL) 12.0 - 15.0 g/dL    Comment: REPEATED TO VERIFY THIS CRITICAL RESULT HAS VERIFIED AND BEEN CALLED TO JOSELINE CERENO RN BY BERTRAM TAYLOR ON 10 14 2023 AT 0320, AND HAS BEEN READ BACK.     HCT 22.8 (L) 36.0 - 46.0 %   MCV 85.7 80.0 - 100.0 fL   MCH 25.6 (L) 26.0 - 34.0 pg   MCHC 29.8 (L) 30.0 - 36.0 g/dL   RDW 23.9 (H) 11.5 - 15.5 %   Platelets 200 150 - 400 K/uL   nRBC 0.0 0.0 - 0.2 %   Neutrophils Relative % 84 %   Neutro Abs 8.1 (H) 1.7 - 7.7 K/uL   Lymphocytes Relative 8 %   Lymphs Abs 0.8 0.7 - 4.0 K/uL   Monocytes Relative 5 %   Monocytes Absolute 0.5 0.1 - 1.0 K/uL   Eosinophils Relative 2 %   Eosinophils Absolute 0.2 0.0 - 0.5 K/uL   Basophils Relative 0 %   Basophils Absolute 0.0 0.0 - 0.1 K/uL   Immature Granulocytes 1 %   Abs Immature Granulocytes 0.10 (H) 0.00 - 0.07 K/uL    Comment: Performed at [facility] [address], [place], [place] [address]  Brain natriuretic peptide     Status: Abnormal   Collection Time: 07/23/22  2:25 AM  Result Value Ref Range   B Natriuretic Peptide 401.3 (H) 0.0 - 100.0 pg/mL    Comment: Performed at [facility] [address] [place], [place] [address]  Basic metabolic panel     Status: Abnormal   Collection Time: 07/23/22  2:25 AM  Result Value Ref Range   Sodium 133 (L) 135 - 145  mmol/L   Potassium 3.3 (L) 3.5 - 5.1 mmol/L   Chloride 99 98 - 111 mmol/L   CO2 27 22 - 32 mmol/L   Glucose, Bld 121 (H) 70 - 99 mg/dL    Comment: Glucose reference range applies only to samples taken after fasting for at least 8 hours.   BUN 6 (L) 8 - 23 mg/dL   Creatinine, Ser 2.37 (H) 0.44 - 1.00 mg/dL   Calcium 7.8 (L) 8.9 - 10.3 mg/dL   GFR, Estimated 21 (L) >60 mL/min    Comment: (NOTE) Calculated using the CKD-EPI Creatinine Equation (2021)    Anion gap 7 5 - 15    Comment: Performed at [facility] [address] [place], [place] [address]  Cortisol     Status: None   Collection Time: 07/23/22  2:25 AM  Result Value Ref Range  Cortisol, Plasma 13.0 ug/dL    Comment: (NOTE) AM    6.7 - 22.6 ug/dL PM   <10.0       ug/dL Performed at [facility], [place] [address] [place], [place] [address]   Prepare RBC (crossmatch)     Status: None   Collection Time: 07/23/22  3:30 AM  Result Value Ref Range   Order Confirmation      ORDER PROCESSED BY BLOOD BANK Performed at [facility], [place] [address] [place], [place] [address]   Glucose, capillary     Status: Abnormal   Collection Time: 07/23/22  4:05 AM  Result Value Ref Range   Glucose-Capillary 120 (H) 70 - 99 mg/dL    Comment: Glucose reference range applies only to samples taken after fasting for at least 8 hours.  Glucose, capillary     Status: Abnormal   Collection Time: 07/23/22  7:57 AM  Result Value Ref Range   Glucose-Capillary 116 (H) 70 - 99 mg/dL    Comment: Glucose reference range applies only to samples taken after fasting for at least 8 hours.   Comment 1 Notify RN   DIC Panel ONCE - STAT     Status: Abnormal   Collection Time: 07/23/22  9:38 AM  Result Value Ref Range   Prothrombin Time 15.8 (H) 11.4 - 15.2 seconds   INR 1.3 (H) 0.8 - 1.2    Comment: (NOTE) INR goal varies based on device and disease states.    aPTT 34 24 - 36 seconds   Fibrinogen 416 210 - 475 mg/dL    Comment:  (NOTE) Fibrinogen results may be underestimated in patients receiving thrombolytic therapy.    D-Dimer, Quant 0.59 (H) 0.00 - 0.50 ug/mL-FEU    Comment: (NOTE) At the manufacturer cut-off value of 0.5 g/mL FEU, this assay has a negative predictive value of 95-100%.This assay is intended for use in conjunction with a clinical pretest probability (PTP) assessment model to exclude pulmonary embolism (PE) and deep venous thrombosis (DVT) in outpatients suspected of PE or DVT. Results should be correlated with clinical presentation.    Platelets 170 150 - 400 K/uL   Smear Review NO SCHISTOCYTES SEEN     Comment: Performed at [facility], [place] [address] [place], [place] [address]  TSH     Status: Abnormal   Collection Time: 07/23/22  9:38 AM  Result Value Ref Range   TSH 0.291 (L) 0.350 - 4.500 uIU/mL    Comment: Performed by a 3rd Generation assay with a functional sensitivity of <=0.01 uIU/mL. Performed at [facility], [place] [address], [place], [place] [address]   T4, free     Status: None   Collection Time: 07/23/22  9:38 AM  Result Value Ref Range   Free T4 0.95 0.61 - 1.12 ng/dL    Comment: (NOTE) Biotin ingestion may interfere with free T4 tests. If the results are inconsistent with the TSH level, previous test results, or the clinical presentation, then consider biotin interference. If needed, order repeat testing after stopping biotin. Performed at [facility], [place] [address] [place], [place] [address]   Hemoglobin and hematocrit, blood     Status: Abnormal   Collection Time: 07/23/22  9:50 AM  Result Value Ref Range   Hemoglobin 8.9 (L) 12.0 - 15.0 g/dL    Comment: REPEATED TO VERIFY POST TRANSFUSION SPECIMEN    HCT 27.8 (L) 36.0 - 46.0 %  Comment: Performed at [facility], [place] [address], [place], [place] [address]  Glucose, capillary     Status: Abnormal   Collection Time: 07/23/22 12:09 PM  Result Value Ref Range   Glucose-Capillary  225 (H) 70 - 99 mg/dL    Comment: Glucose reference range applies only to samples taken after fasting for at least 8 hours.   Comment 1 Notify RN   Glucose, capillary     Status: None   Collection Time: 07/23/22  5:33 PM  Result Value Ref Range   Glucose-Capillary 75 70 - 99 mg/dL    Comment: Glucose reference range applies only to samples taken after fasting for at least 8 hours.  Glucose, capillary     Status: Abnormal   Collection Time: 07/23/22  7:38 PM  Result Value Ref Range   Glucose-Capillary 135 (H) 70 - 99 mg/dL    Comment: Glucose reference range applies only to samples taken after fasting for at least 8 hours.  Glucose, capillary     Status: Abnormal   Collection Time: 07/24/22 12:00 AM  Result Value Ref Range   Glucose-Capillary 102 (H) 70 - 99 mg/dL    Comment: Glucose reference range applies only to samples taken after fasting for at least 8 hours.  Magnesium     Status: None   Collection Time: 07/24/22  3:37 AM  Result Value Ref Range   Magnesium 2.1 1.7 - 2.4 mg/dL    Comment: Performed at [facility] [address] [place], [place] [address]  CBC with Differential/Platelet     Status: Abnormal   Collection Time: 07/24/22  3:37 AM  Result Value Ref Range   WBC 8.3 4.0 - 10.5 K/uL   RBC 3.28 (L) 3.87 - 5.11 MIL/uL   Hemoglobin 8.9 (L) 12.0 - 15.0 g/dL   HCT 28.0 (L) 36.0 - 46.0 %   MCV 85.4 80.0 - 100.0 fL   MCH 27.1 26.0 - 34.0 pg   MCHC 31.8 30.0 - 36.0 g/dL   RDW 20.8 (H) 11.5 - 15.5 %   Platelets 184 150 - 400 K/uL    Comment: REPEATED TO VERIFY   nRBC 0.0 0.0 - 0.2 %   Neutrophils Relative % 79 %   Neutro Abs 6.5 1.7 - 7.7 K/uL   Lymphocytes Relative 11 %   Lymphs Abs 1.0 0.7 - 4.0 K/uL   Monocytes Relative 6 %   Monocytes Absolute 0.5 0.1 - 1.0 K/uL   Eosinophils Relative 3 %   Eosinophils Absolute 0.3 0.0 - 0.5 K/uL   Basophils Relative 0 %   Basophils Absolute 0.0 0.0 - 0.1 K/uL   Immature Granulocytes 1 %   Abs Immature Granulocytes  0.07 0.00 - 0.07 K/uL    Comment: Performed at [facility], [place] [address], [place], [place] [address]  Brain natriuretic peptide     Status: Abnormal   Collection Time: 07/24/22  3:37 AM  Result Value Ref Range   B Natriuretic Peptide 515.4 (H) 0.0 - 100.0 pg/mL    Comment: Performed at [facility] [address], [place],  [address]  Basic metabolic panel     Status: Abnormal   Collection Time: 07/24/22  3:37 AM  Result Value Ref Range   Sodium 135 135 - 145 mmol/L   Potassium 3.0 (L) 3.5 - 5.1 mmol/L   Chloride 96 (L) 98 - 111 mmol/L   CO2 28 22 - 32 mmol/L   Glucose,  Bld 96 70 - 99 mg/dL    Comment: Glucose reference range applies only to samples taken after fasting for at least 8 hours.   BUN 11 8 - 23 mg/dL   Creatinine, Ser 3.43 (H) 0.44 - 1.00 mg/dL    Comment: DELTA CHECK NOTED   Calcium 8.4 (L) 8.9 - 10.3 mg/dL   GFR, Estimated 13 (L) >60 mL/min    Comment: (NOTE) Calculated using the CKD-EPI Creatinine Equation (2021)    Anion gap 11 5 - 15    Comment: Performed at [facility] [address], [place], [place] [address]  Glucose, capillary     Status: None   Collection Time: 07/24/22  4:17 AM  Result Value Ref Range   Glucose-Capillary 98 70 - 99 mg/dL    Comment: Glucose reference range applies only to samples taken after fasting for at least 8 hours.  Glucose, capillary     Status: Abnormal   Collection Time: 07/24/22  7:38 AM  Result Value Ref Range   Glucose-Capillary 109 (H) 70 - 99 mg/dL    Comment: Glucose reference range applies only to samples taken after fasting for at least 8 hours.    Radiology/Results: VAS [place] LOWER EXTREMITY VENOUS (DVT)  Result Date: 07/22/2022  Lower Venous DVT Study Patient Name:  TIFFANCY MOGER  Date of Exam:   07/22/2022 Medical Rec #: [id number]         Accession #:    [id number] Date of Birth: [birth year]          Patient Gender: F Patient Age:   55 years Exam Location:  [facility] Procedure:       VAS [place] LOWER EXTREMITY VENOUS (DVT) Referring Phys: Deno Etienne Procedure [facility] --------------------------------------------------------------------------------  Indications: Swelling.  Risk Factors: LLE DVT 2018. Anticoagulation: Eliquis. Limitations: Poor ultrasound/tissue interface. Comparison Study: Previous exam on 10/10/19 was negative for DVT. Performing Technologist: Rogelia Rohrer RVT, RDMS  Examination Guidelines: A complete evaluation includes B-mode imaging, spectral Doppler, color Doppler, and power Doppler as needed of all accessible portions of each vessel. Bilateral testing is considered an integral part of a complete examination. Limited examinations for reoccurring indications may be performed as noted. The reflux portion of the exam is performed with the patient in reverse Trendelenburg.  +---------+---------------+---------+-----------+----------+-------------------+ RIGHT    CompressibilityPhasicitySpontaneityPropertiesThrombus Aging      +---------+---------------+---------+-----------+----------+-------------------+ CFV      Full           Yes      Yes                                      +---------+---------------+---------+-----------+----------+-------------------+ SFJ      Full                                                             +---------+---------------+---------+-----------+----------+-------------------+ FV Prox  Full           Yes      Yes                                      +---------+---------------+---------+-----------+----------+-------------------+ FV Mid   Full  Yes      Yes                                      +---------+---------------+---------+-----------+----------+-------------------+ FV DistalFull           Yes      Yes                                      +---------+---------------+---------+-----------+----------+-------------------+ PFV      Full                                                              +---------+---------------+---------+-----------+----------+-------------------+ POP      Full           Yes      Yes                                      +---------+---------------+---------+-----------+----------+-------------------+ PTV                                                   Not well visualized +---------+---------------+---------+-----------+----------+-------------------+ PERO                                                  Not well visualized +---------+---------------+---------+-----------+----------+-------------------+   +---------+---------------+---------+-----------+----------+-------------------+ LEFT     CompressibilityPhasicitySpontaneityPropertiesThrombus Aging      +---------+---------------+---------+-----------+----------+-------------------+ CFV      Full           Yes      Yes                                      +---------+---------------+---------+-----------+----------+-------------------+ SFJ      Full                                                             +---------+---------------+---------+-----------+----------+-------------------+ FV Prox  Full           Yes      Yes                                      +---------+---------------+---------+-----------+----------+-------------------+ FV Mid   Full           Yes      Yes                                      +---------+---------------+---------+-----------+----------+-------------------+  FV DistalFull           Yes      Yes                                      +---------+---------------+---------+-----------+----------+-------------------+ PFV      Full                                                             +---------+---------------+---------+-----------+----------+-------------------+ POP      Full           Yes      Yes                                      +---------+---------------+---------+-----------+----------+-------------------+  PTV      Full                                         Not well visualized +---------+---------------+---------+-----------+----------+-------------------+ PERO     Full                                         Not well visualized +---------+---------------+---------+-----------+----------+-------------------+     Summary: BILATERAL: - No evidence of deep vein thrombosis seen in the lower extremities, bilaterally. -No evidence of popliteal cyst, bilaterally.   *See table(s) above for measurements and observations. Electronically signed by Monica Martinez MD on 07/22/2022 at 1:40:37 PM.    Final    ECHOCARDIOGRAM COMPLETE  Result Date: 07/22/2022    ECHOCARDIOGRAM REPORT   Patient Name:   WESTLYN GLAZA Date of Exam: 07/22/2022 Medical Rec #:  [id number]        Height:       60.0 in Accession #:    [id number]       Weight:       163.0 lb Date of Birth:  August 01, 1946         BSA:          35.711 m Patient Age:    80 years         BP:           120/49 mmHg Patient Gender: F                HR:           66 bpm. Exam Location:  Inpatient Procedure: 2D Echo, Cardiac Doppler and Color Doppler Indications:    Syncope R55  History:        Patient has prior history of Echocardiogram examinations, most                 recent 03/11/2017. Risk Factors:Hypertension, Sleep Apnea,                 Diabetes and Dyslipidemia.  Sonographer:    Ronny Flurry Referring Phys: [place]  1. Left ventricular ejection fraction, by estimation, is 60 to 65%. The left ventricle has normal function. The  left ventricle has no regional wall motion abnormalities. There is mild left ventricular hypertrophy. Left ventricular diastolic parameters are consistent with Grade I diastolic dysfunction (impaired relaxation).  2. Right ventricular systolic function is normal. The right ventricular size is normal. Tricuspid regurgitation signal is inadequate for assessing PA pressure.  3. Left atrial size was moderately  dilated.  4. Right atrial size was mildly dilated.  5. Trivial mitral valve regurgitation.  6. There is moderate calcification of the aortic valve. Aortic valve regurgitation is not visualized. Mild aortic valve stenosis.  7. The inferior vena cava is normal in size with greater than 50% respiratory variability, suggesting right atrial pressure of 3 mmHg. FINDINGS  Left Ventricle: Left ventricular ejection fraction, by estimation, is 60 to 65%. The left ventricle has normal function. The left ventricle has no regional wall motion abnormalities. The left ventricular internal cavity size was normal in size. There is  mild left ventricular hypertrophy. Left ventricular diastolic parameters are consistent with Grade I diastolic dysfunction (impaired relaxation). Right Ventricle: The right ventricular size is normal. No increase in right ventricular wall thickness. Right ventricular systolic function is normal. Tricuspid regurgitation signal is inadequate for assessing PA pressure. Left Atrium: Left atrial size was moderately dilated. Right Atrium: Right atrial size was mildly dilated. Pericardium: There is no evidence of pericardial effusion. Mitral Valve: There is severe calcification of the mitral valve leaflet(s). Trivial mitral valve regurgitation. MV peak gradient, 9.7 mmHg. The mean mitral valve gradient is 4.5 mmHg. Tricuspid Valve: The tricuspid valve is normal in structure. Tricuspid valve regurgitation is not demonstrated. Aortic Valve: There is moderate calcification of the aortic valve. Aortic valve regurgitation is not visualized. Mild aortic stenosis is present. Aortic valve mean gradient measures 11.0 mmHg. Aortic valve peak gradient measures 18.5 mmHg. Aortic valve area, by VTI measures 2.14 cm. Pulmonic Valve: The pulmonic valve was grossly normal. Pulmonic valve regurgitation is not visualized. Aorta: The aortic root and ascending aorta are structurally normal, with no evidence of dilitation. Venous:  The inferior vena cava is normal in size with greater than 50% respiratory variability, suggesting right atrial pressure of 3 mmHg. IAS/Shunts: No atrial level shunt detected by color flow Doppler.  LEFT VENTRICLE PLAX 2D LVIDd:         3.80 cm   Diastology LVIDs:         2.60 cm   LV e' medial:    6.09 cm/s LV PW:         1.10 cm   LV E/e' medial:  20.2 LV IVS:        1.20 cm   LV e' lateral:   6.64 cm/s LVOT diam:     2.10 cm   LV E/e' lateral: 18.5 LV SV:         113 LV SV Index:   66 LVOT Area:     3.46 cm  RIGHT VENTRICLE             IVC RV S prime:     11.00 cm/s  IVC diam: 1.60 cm TAPSE (M-mode): 2.4 cm LEFT ATRIUM             Index        RIGHT ATRIUM           Index LA diam:        4.00 cm 2.34 cm/m   RA Area:     10.70 cm LA Vol (A2C):   84.2 ml 49.21 ml/m  RA Volume:  20.60 ml  12.04 ml/m LA Vol (A4C):   51.9 ml 30.33 ml/m LA Biplane Vol: 72.5 ml 42.37 ml/m  AORTIC VALVE AV Area (Vmax):    1.85 cm AV Area (Vmean):   1.99 cm AV Area (VTI):     2.14 cm AV Vmax:           215.00 cm/s AV Vmean:          160.000 cm/s AV VTI:            0.527 m AV Peak Grad:      18.5 mmHg AV Mean Grad:      11.0 mmHg LVOT Vmax:         115.00 cm/s LVOT Vmean:        91.900 cm/s LVOT VTI:          0.326 m LVOT/AV VTI ratio: 0.62  AORTA Ao Root diam: 3.20 cm Ao Asc diam:  3.30 cm MITRAL VALVE MV Area (PHT): 2.73 cm     SHUNTS MV Area VTI:   2.06 cm     Systemic VTI:  0.33 m MV Peak grad:  9.7 mmHg     Systemic Diam: 2.10 cm MV Mean grad:  4.5 mmHg MV Vmax:       1.56 m/s MV Vmean:      99.9 cm/s MV Decel Time: 278 msec MR Peak grad: 63.5 mmHg MR Vmax:      398.50 cm/s MV E velocity: 123.00 cm/s MV A velocity: 143.00 cm/s MV E/A ratio:  0.86 Mary Scientist, physiological signed by Phineas Inches Signature Date/Time: 07/22/2022/10:32:06 AM    Final     Anti-infectives: Anti-infectives (From admission, onward)    Start     Dose/Rate Route Frequency Ordered Stop   07/22/22 0015  cefTRIAXone (ROCEPHIN) 2 g in sodium  chloride 0.9 % 100 mL IVPB        2 g 200 mL/hr over 30 Minutes Intravenous Daily at 10 pm 07/22/22 0006     07/22/22 0015  metroNIDAZOLE (FLAGYL) IVPB 500 mg        500 mg 100 mL/hr over 60 Minutes Intravenous 2 times daily 07/22/22 0006     07/22/22 0000  piperacillin-tazobactam (ZOSYN) IVPB 3.375 g  Status:  Discontinued        3.375 g 100 mL/hr over 30 Minutes Intravenous  Once 07/21/22 2349 07/22/22 0006       Assessment/Plan: Problem List: Patient Active Problem List   Diagnosis Date Noted   Blurry vision, bilateral 07/22/2022   Diverticulitis of intestine with abscess without bleeding 07/22/2022   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 06/22/2022   Other constipation 02/12/2022   Obstipation 02/12/2022   Pressure injury of back, stage 1 01/16/2022   Thoracic spine fracture ([place]) 01/09/2022   Hyperkalemia 01/09/2022   Anxiety 01/09/2022   History of DVT (deep vein thrombosis) 01/09/2022   Gout 01/09/2022   Postmenopausal vaginal bleeding    Acute blood loss anemia 06/25/2020   Acute on chronic respiratory failure with hypoxia and hypercapnia ([place]) 06/25/2020   Advanced care planning/counseling discussion    Goals of care, counseling/discussion    Obstructive sleep apnea    Primary osteoarthritis, left ankle and foot    Ankle fracture 06/14/2020   HLD (hyperlipidemia) 06/14/2020   Dyspnea, unspecified 02/05/2020   Intractable pain 01/24/2020   Bilateral shoulder pain 01/23/2020   Right knee pain 01/23/2020   Uncontrolled type 2 diabetes mellitus with hyperglycemia, with long-term current use of  insulin ([place]) 01/23/2020   Pressure injury of skin 01/22/2020   Mild protein-calorie malnutrition ([place]) 12/16/2019   Personal history of anaphylaxis 12/05/2019   Pruritus, unspecified 12/05/2019   ESRD on hemodialysis ([place]) 11/20/2019   Myoclonus, segmental 11/20/2019   Allergy, unspecified, initial encounter 07/29/2019   Anemia in chronic kidney disease 07/15/2019    Coagulation defect, unspecified ([place]) 07/13/2019   Iron deficiency anemia, unspecified 07/11/2019   Encounter for immunization 07/06/2019   End stage renal disease ([place]) 07/02/2019   GERD (gastroesophageal reflux disease) 07/02/2019   Low back pain 07/02/2019   BMI 34.0-34.9,adult 07/02/2019   Other specified disorders of bone density and structure, unspecified site 07/02/2019   Secondary hyperparathyroidism of renal origin ([place]) 07/02/2019   Unspecified osteoarthritis, unspecified site 07/02/2019   OSA (obstructive sleep apnea) 03/13/2019   Chronic acquired lymphedema 11/05/2018   Kidney lesion 03/30/2017   Insulin-requiring or dependent type II diabetes mellitus ([place]) 03/11/2017   Essential hypertension 03/11/2017   Impingement syndrome of left ankle 01/31/2017   Posterior tibial tendinitis, left leg 01/31/2017   Incarcerated ventral hernia 01/02/2013    May need manual disimpaction after glycerine suppository for hard stool * No surgery found *    LOS: 2 days   Matt B. Hassell Done, MD, [facility] [phone] to reach the surgeon on call.    07/24/2022 8:13 AM

## 2022-07-24 NOTE — Progress Notes (Signed)
Subjective: Complains of constipation, no abdominal pain, no SOB ,HD tomorrow on schedule  Objective Vital signs in last 24 hours: Vitals:   07/23/22 1600 07/23/22 1942 07/24/22 0420 07/24/22 0748  BP: (!) 101/50 (!) 140/58 (!) 139/59 (!) 131/51  Pulse: (!) 49 61 (!) 53 60  Resp: '16 18 17 18  '$ Temp: 97.9 F (36.6 C) 99.1 F (37.3 C) 98.2 F (36.8 C) 98 F (36.7 C)  TempSrc: Oral Oral Oral Oral  SpO2: 100% 97% 99% 98%  Weight:      Height:       Weight change:    Physical Exam: General: Pleasant, appropriate elderly female NAD Heart: RRR, no MRG Lungs: CTA bilaterally nonlabored breathing Abdomen: NABS, soft NTND no ascites Extremities: Trace bipedal edema Dialysis Access: RUA AVF positive bruit   Home meds include - albuterol, apixaban, atorvastatin, buprenorphine, carbidopa-levodopa, gabapentin, insulin aspart/ NPH, midodrine 20 pre hd mwf, mirtazapine, pantoprazole, ropinirole, tamsulosin, calcium acetate 1 ac tid, prns/ vits/ supps    OP HD: MWF NW  3.5h  400/1.5  70kg   2/2 bath  Hep none  RUA AVF - last HD 10/11 post wt 71.1kg - last Hb 7.3 - mircera 225 ug q 2, last 10/9 - calcitriol 0.5 ug po tiw mwf     CXR - no active disease     Problem/Plan: Diverticulitis with small abscess= surgery consulted no need for surgery currently IV antibiotics (Rocephin and metronidazole )and liquid diet ESRD -HD M WF on schedule K3.0, p.o. supplement per admit team, follow-up lab in a.m. use 4K bath HTN/volume = history of chronic hypotension on midodrine 20 mg predialysis , x-ray no active disease  Anemia of chronic disease/acute illness= Hgb-8.9 < 6.8, status post 1 unit RBC , TSAT 38%,  continue high-dose ESA next due 10/23/transfuse  prn Hgb less than 7 Secondary hyperparathyroidism - CCa  in range, phosphorus 2.4 hold binder (calcium acetate ) follow-up trend IDDM= per admit team  Ernest Haber, PA-C [facility] [phone] 07/24/2022,11:27 AM  LOS: 2  days   Labs: Basic Metabolic Panel: Recent Labs  Lab 07/22/22 0438 07/22/22 1716 07/23/22 0225 07/24/22 0337  NA 138  --  133* 135  K 3.7  --  3.3* 3.0*  CL 101  --  99 96*  CO2 30  --  27 28  GLUCOSE 105*  --  121* 96  BUN 11  --  6* 11  CREATININE 3.71*  --  2.37* 3.43*  CALCIUM 8.0*  --  7.8* 8.4*  PHOS  --  2.4*  --   --    Liver Function Tests: Recent Labs  Lab 07/21/22 1538  AST 9*  ALT <5  ALKPHOS 102  BILITOT 0.5  PROT 5.6*  ALBUMIN 2.9*   No results for input(s): "LIPASE", "AMYLASE" in the last 168 hours. No results for input(s): "AMMONIA" in the last 168 hours. CBC: Recent Labs  Lab 07/21/22 1538 07/22/22 0438 07/23/22 0225 07/23/22 0938 07/23/22 0950 07/24/22 0337  WBC 11.1* 9.2 9.7  --   --  8.3  NEUTROABS 9.7*  --  8.1*  --   --  6.5  HGB 7.8* 7.1* 6.8*  --  8.9* 8.9*  HCT 26.3* 24.0* 22.8*  --  27.8* 28.0*  MCV 87.4 87.0 85.7  --   --  85.4  PLT 241 231 200 170  --  184   Cardiac Enzymes: No results for input(s): "CKTOTAL", "CKMB", "CKMBINDEX", "TROPONINI" in the last 168  hours. CBG: Recent Labs  Lab 07/23/22 1733 07/23/22 1938 07/24/22 0000 07/24/22 0417 07/24/22 0738  GLUCAP 75 135* 102* 98 109*    Studies/Results: DG Abd Portable 1V  Result Date: 07/24/2022 CLINICAL DATA:  76 year old female with nausea. Sigmoid diverticulitis with suspected abscess. EXAM: PORTABLE ABDOMEN - 1 VIEW COMPARISON:  Recent CT Abdomen and Pelvis 07/21/2022. FINDINGS: Portable AP supine views at 0820 hours. Negative lung bases. Nonobstructed bowel gas pattern, with a greater paucity of bowel gas compared to 07/21/2022. No pneumoperitoneum is evident on these supine views. Dystrophic pelvic calcifications redemonstrated. Severe right hip joint degeneration. No acute osseous abnormality identified. IMPRESSION: Nonobstructed bowel-gas pattern with no pneumoperitoneum evident on these supine images. Electronically Signed   By: Genevie Ann M.D.   On: 07/24/2022 08:50    VAS [place] LOWER EXTREMITY VENOUS (DVT)  Result Date: 07/22/2022  Lower Venous DVT Study Patient Name:  Jean Mcclain  Date of Exam:   07/22/2022 Medical Rec #: [id number]         Accession #:    [id number] Date of Birth: 09-23-1946          Patient Gender: F Patient Age:   25 years Exam Location:  [facility] Procedure:      VAS [place] LOWER EXTREMITY VENOUS (DVT) [facility] --------------------------------------------------------------------------------  Indications: Swelling.  Risk Factors: LLE DVT 2018. Anticoagulation: Eliquis. Limitations: Poor ultrasound/tissue interface. Comparison Study: Previous exam on 10/10/19 was negative for DVT. Performing Technologist: Rogelia Rohrer RVT, RDMS  Examination Guidelines: A complete evaluation includes B-mode imaging, spectral Doppler, color Doppler, and power Doppler as needed of all accessible portions of each vessel. Bilateral testing is considered an integral part of a complete examination. Limited examinations for reoccurring indications may be performed as noted. The reflux portion of the exam is performed with the patient in reverse Trendelenburg.  +---------+---------------+---------+-----------+----------+-------------------+ RIGHT    CompressibilityPhasicitySpontaneityPropertiesThrombus Aging      +---------+---------------+---------+-----------+----------+-------------------+ CFV      Full           Yes      Yes                                      +---------+---------------+---------+-----------+----------+-------------------+ SFJ      Full                                                             +---------+---------------+---------+-----------+----------+-------------------+ FV Prox  Full           Yes      Yes                                      +---------+---------------+---------+-----------+----------+-------------------+ FV Mid   Full           Yes      Yes                                       +---------+---------------+---------+-----------+----------+-------------------+ FV DistalFull           Yes  Yes                                      +---------+---------------+---------+-----------+----------+-------------------+ PFV      Full                                                             +---------+---------------+---------+-----------+----------+-------------------+ POP      Full           Yes      Yes                                      +---------+---------------+---------+-----------+----------+-------------------+ PTV                                                   Not well visualized +---------+---------------+---------+-----------+----------+-------------------+ PERO                                                  Not well visualized +---------+---------------+---------+-----------+----------+-------------------+   +---------+---------------+---------+-----------+----------+-------------------+ LEFT     CompressibilityPhasicitySpontaneityPropertiesThrombus Aging      +---------+---------------+---------+-----------+----------+-------------------+ CFV      Full           Yes      Yes                                      +---------+---------------+---------+-----------+----------+-------------------+ SFJ      Full                                                             +---------+---------------+---------+-----------+----------+-------------------+ FV Prox  Full           Yes      Yes                                      +---------+---------------+---------+-----------+----------+-------------------+ FV Mid   Full           Yes      Yes                                      +---------+---------------+---------+-----------+----------+-------------------+ FV DistalFull           Yes      Yes                                      +---------+---------------+---------+-----------+----------+-------------------+  PFV      Full                                                             +---------+---------------+---------+-----------+----------+-------------------+ POP      Full           Yes      Yes                                      +---------+---------------+---------+-----------+----------+-------------------+ PTV      Full                                         Not well visualized +---------+---------------+---------+-----------+----------+-------------------+ PERO     Full                                         Not well visualized +---------+---------------+---------+-----------+----------+-------------------+     Summary: BILATERAL: - No evidence of deep vein thrombosis seen in the lower extremities, bilaterally. -No evidence of popliteal cyst, bilaterally.   *See table(s) above for measurements and observations. Electronically signed by Monica Martinez MD on 07/22/2022 at 1:40:37 PM.    Final    Medications:  cefTRIAXone (ROCEPHIN)  IV 2 g (07/23/22 2017)   metronidazole 500 mg (07/24/22 0917)    sodium chloride   Intravenous Once   atorvastatin  20 mg Oral Daily   [START ON 07/27/2022] buprenorphine  1 patch Transdermal Weekly   carbidopa-levodopa  1 tablet Oral BID   carbidopa-levodopa  2 tablet Oral q AM   Chlorhexidine Gluconate Cloth  6 each Topical Q0600   docusate sodium  200 mg Oral BID   doxercalciferol  2 mcg Intravenous Q M,W,F-HD   fluticasone  1 spray Each Nare BID   gabapentin  300 mg Oral QHS   insulin aspart  0-6 Units Subcutaneous Q4H   magnesium hydroxide  30 mL Oral Once   melatonin  5 mg Oral QHS   midodrine  20 mg Oral Q M,W,F-HD   mirtazapine  15 mg Oral QHS   nystatin  1 Application Topical H9622   pantoprazole (PROTONIX) IV  40 mg Intravenous Q12H   polyethylene glycol  17 g Oral BID   rOPINIRole  12 mg Oral Daily   tamsulosin  0.4 mg Oral QPC supper

## 2022-07-24 NOTE — NC FL2 (Signed)
[place] MEDICAID FL2 LEVEL OF CARE SCREENING TOOL     IDENTIFICATION  Patient Name: Jean Mcclain Birthdate: 1946-07-20 Sex: female Admission Date (Current Location): 07/21/2022  [facility] and [place] Number:  Herbalist and Address:  [facility] 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 06/22/2022   Other constipation 02/12/2022   Obstipation 02/12/2022   Pressure injury of back, stage 1 01/16/2022   Thoracic spine fracture ([place]) 01/09/2022   Hyperkalemia 01/09/2022   Anxiety 01/09/2022   History of DVT (deep vein thrombosis) 01/09/2022   Gout 01/09/2022   Postmenopausal vaginal bleeding    Acute blood loss anemia 06/25/2020   Acute on chronic respiratory failure with hypoxia and hypercapnia ([place]) 06/25/2020   Advanced care planning/counseling discussion    Goals of care, counseling/discussion    Obstructive sleep apnea    Primary osteoarthritis, left ankle and foot    Ankle fracture 06/14/2020   HLD (hyperlipidemia) 06/14/2020   Dyspnea, unspecified 02/05/2020   Intractable pain 01/24/2020   Bilateral shoulder pain 01/23/2020   Right knee pain 01/23/2020   Uncontrolled type 2 diabetes mellitus with hyperglycemia, with long-term current use of insulin ([place]) 01/23/2020   Pressure injury of skin 01/22/2020   Mild protein-calorie malnutrition ([place]) 12/16/2019   Personal history of anaphylaxis  12/05/2019   Pruritus, unspecified 12/05/2019   ESRD on hemodialysis ([place]) 11/20/2019   Myoclonus, segmental 11/20/2019   Allergy, unspecified, initial encounter 07/29/2019   Anemia in chronic kidney disease 07/15/2019   Coagulation defect, unspecified ([place]) 07/13/2019   Iron deficiency anemia, unspecified 07/11/2019   Encounter for immunization 07/06/2019   End stage renal disease ([place]) 07/02/2019   GERD (gastroesophageal reflux disease) 07/02/2019   Low back pain 07/02/2019   BMI 34.0-34.9,adult 07/02/2019   Other specified disorders of bone density and structure, unspecified site 07/02/2019   Secondary hyperparathyroidism of renal origin ([place]) 07/02/2019   Unspecified osteoarthritis, unspecified site 07/02/2019   OSA (obstructive sleep apnea) 03/13/2019   Chronic acquired lymphedema 11/05/2018   Kidney lesion 03/30/2017   Insulin-requiring or dependent type II diabetes mellitus ([place]) 03/11/2017   Essential hypertension 03/11/2017   Impingement syndrome of left ankle 01/31/2017   Posterior tibial tendinitis, left leg 01/31/2017   Incarcerated ventral hernia 01/02/2013    Orientation RESPIRATION BLADDER Height & Weight     Time, Self, Situation, Place  Normal Continent Weight: 154 lb 12.2 oz (70.2 kg) Height:  5' (152.4 cm)  BEHAVIORAL SYMPTOMS/MOOD NEUROLOGICAL BOWEL NUTRITION STATUS      Continent Diet  AMBULATORY STATUS COMMUNICATION OF NEEDS Skin   Extensive Assist Verbally Normal                       Personal Care Assistance Level of Assistance  Bathing, Feeding, Dressing Bathing Assistance: Limited assistance   Dressing Assistance: Limited assistance     Functional Limitations Info  Sight,  Hearing, Speech Sight Info: Adequate Hearing Info: Adequate Speech Info: Adequate    SPECIAL CARE FACTORS FREQUENCY  PT (By licensed PT), OT (By licensed OT)     PT Frequency: 5x a week OT Frequency: 5x a week            Contractures Contractures Info: Not  present    Additional Factors Info  Code Status, Allergies Code Status Info: DNR Allergies Info: Morphine           Current Medications (07/24/2022):  This is the current hospital active medication list Current Facility-Administered Medications  Medication Dose Route Frequency Provider Last Rate Last Admin   0.9 %  sodium chloride infusion (Manually program via Guardrails IV Fluids)   Intravenous Once Thurnell Lose, MD       acetaminophen (TYLENOL) tablet 650 mg  650 mg Oral Q6H PRN Etta Quill, DO   650 mg at 07/23/22 1520   Or   acetaminophen (TYLENOL) suppository 650 mg  650 mg Rectal Q6H PRN Etta Quill, DO       albuterol (PROVENTIL) (2.5 MG/3ML) 0.083% nebulizer solution 3 mL  3 mL Inhalation Q6H PRN Etta Quill, DO       atorvastatin (LIPITOR) tablet 20 mg  20 mg Oral Daily Jennette Kettle M, DO   20 mg at 07/24/22 0914   [START ON 07/27/2022] buprenorphine (BUTRANS) 7.5 MCG/HR 1 patch  1 patch Transdermal Weekly Jennette Kettle M, DO       camphor-menthol [facility]) lotion 1 Application  1 Application Topical Daily PRN Etta Quill, DO       carbidopa-levodopa (SINEMET IR) 25-100 MG per tablet immediate release 1 tablet  1 tablet Oral BID Etta Quill, DO   1 tablet at 07/23/22 2209   carbidopa-levodopa (SINEMET IR) 25-100 MG per tablet immediate release 2 tablet  2 tablet Oral q AM Etta Quill, DO   2 tablet at 07/24/22 0916   cefTRIAXone (ROCEPHIN) 2 g in sodium chloride 0.9 % 100 mL IVPB  2 g Intravenous Q2200 Jennette Kettle M, DO 200 mL/hr at 07/23/22 2017 2 g at 07/23/22 2017   Chlorhexidine Gluconate Cloth 2 % PADS 6 each  6 each Topical Q0600 Roney Jaffe, MD   6 each at 07/24/22 0916   docusate sodium (COLACE) capsule 200 mg  200 mg Oral BID Thurnell Lose, MD   200 mg at 07/24/22 0913   doxercalciferol (HECTOROL) injection 2 mcg  2 mcg Intravenous Q M,W,F-HD Jennette Kettle M, DO   2 mcg at 07/22/22 1409   fluticasone (FLONASE) 50 MCG/ACT  nasal spray 1 spray  1 spray Each Nare BID Etta Quill, DO   1 spray at 07/24/22 0915   gabapentin (NEURONTIN) capsule 300 mg  300 mg Oral QHS Jennette Kettle M, DO   300 mg at 07/23/22 2209   Glycerin (Adult) 2 g suppository 1 suppository  1 suppository Rectal Daily PRN Johnathan Hausen, MD       hydrOXYzine (ATARAX) tablet 50 mg  50 mg Oral BID PRN Etta Quill, DO       insulin aspart (novoLOG) injection 0-6 Units  0-6 Units Subcutaneous Q4H Jennette Kettle M, DO   2 Units at 07/23/22 1226   magnesium hydroxide (MILK OF MAGNESIA) suspension 30 mL  30 mL Oral Once Thurnell Lose, MD       melatonin tablet 5 mg  5 mg Oral QHS Etta Quill, DO  5 mg at 07/23/22 2209   metroNIDAZOLE (FLAGYL) IVPB 500 mg  500 mg Intravenous BID Etta Quill, DO 100 mL/hr at 07/24/22 0917 500 mg at 07/24/22 0917   midodrine (PROAMATINE) tablet 10 mg  10 mg Oral TID PRN Thurnell Lose, MD       midodrine (PROAMATINE) tablet 20 mg  20 mg Oral Q M,W,F-HD Jennette Kettle M, DO   20 mg at 07/22/22 1552   mirtazapine (REMERON) tablet 15 mg  15 mg Oral QHS Jennette Kettle M, DO   15 mg at 07/23/22 2209   nystatin (MYCOSTATIN/NYSTOP) topical powder 1 Application  1 Application Topical G9021 Etta Quill, DO   1 Application at 11/55/20 1202   ondansetron (ZOFRAN) injection 4 mg  4 mg Intravenous Q6H PRN Etta Quill, DO   4 mg at 07/24/22 0934   pantoprazole (PROTONIX) injection 40 mg  40 mg Intravenous Q12H Thurnell Lose, MD   40 mg at 07/24/22 0915   polyethylene glycol (MIRALAX / GLYCOLAX) packet 17 g  17 g Oral BID Thurnell Lose, MD   17 g at 07/24/22 0914   rOPINIRole (REQUIP XL) 24 hr tablet 12 mg  12 mg Oral Daily Jennette Kettle M, DO   12 mg at 07/24/22 0915   simethicone (MYLICON) chewable tablet 120 mg  120 mg Oral Q12H PRN Etta Quill, DO       tamsulosin [facility]) capsule 0.4 mg  0.4 mg Oral QPC supper Etta Quill, DO   0.4 mg at 07/23/22 8022     Discharge  Medications: Please see discharge summary for a list of discharge medications.  Relevant Imaging Results:  Relevant Lab Results:   Additional Information [id number]  Emeterio Reeve, [place]

## 2022-07-24 NOTE — Progress Notes (Signed)
Mobility Specialist Progress Note   07/24/22 1455  Mobility  Activity Refused mobility   Patient has diarrhea and declined to ambulate at this time. Will f/u as time permits  Jean Mcclain, [name], [name]  EXMDY:[phone] Office: [phone]

## 2022-07-25 DIAGNOSIS — N186 End stage renal disease: Secondary | ICD-10-CM | POA: Diagnosis not present

## 2022-07-25 DIAGNOSIS — D631 Anemia in chronic kidney disease: Secondary | ICD-10-CM | POA: Diagnosis not present

## 2022-07-25 DIAGNOSIS — K5792 Diverticulitis of intestine, part unspecified, without perforation or abscess without bleeding: Secondary | ICD-10-CM | POA: Diagnosis not present

## 2022-07-25 DIAGNOSIS — E119 Type 2 diabetes mellitus without complications: Secondary | ICD-10-CM | POA: Diagnosis not present

## 2022-07-25 LAB — CBC WITH DIFFERENTIAL/PLATELET
Abs Immature Granulocytes: 0.06 10*3/uL (ref 0.00–0.07)
Basophils Absolute: 0 10*3/uL (ref 0.0–0.1)
Basophils Relative: 0 %
Eosinophils Absolute: 0.2 10*3/uL (ref 0.0–0.5)
Eosinophils Relative: 2 %
HCT: 29.1 % — ABNORMAL LOW (ref 36.0–46.0)
Hemoglobin: 9 g/dL — ABNORMAL LOW (ref 12.0–15.0)
Immature Granulocytes: 1 %
Lymphocytes Relative: 10 %
Lymphs Abs: 0.9 10*3/uL (ref 0.7–4.0)
MCH: 26.8 pg (ref 26.0–34.0)
MCHC: 30.9 g/dL (ref 30.0–36.0)
MCV: 86.6 fL (ref 80.0–100.0)
Monocytes Absolute: 0.5 10*3/uL (ref 0.1–1.0)
Monocytes Relative: 6 %
Neutro Abs: 7.1 10*3/uL (ref 1.7–7.7)
Neutrophils Relative %: 81 %
Platelets: 199 10*3/uL (ref 150–400)
RBC: 3.36 MIL/uL — ABNORMAL LOW (ref 3.87–5.11)
RDW: 21.4 % — ABNORMAL HIGH (ref 11.5–15.5)
WBC: 8.8 10*3/uL (ref 4.0–10.5)
nRBC: 0.3 % — ABNORMAL HIGH (ref 0.0–0.2)

## 2022-07-25 LAB — BASIC METABOLIC PANEL
Anion gap: 9 (ref 5–15)
BUN: 14 mg/dL (ref 8–23)
CO2: 29 mmol/L (ref 22–32)
Calcium: 8.3 mg/dL — ABNORMAL LOW (ref 8.9–10.3)
Chloride: 97 mmol/L — ABNORMAL LOW (ref 98–111)
Creatinine, Ser: 4.11 mg/dL — ABNORMAL HIGH (ref 0.44–1.00)
GFR, Estimated: 11 mL/min — ABNORMAL LOW (ref >60)
Glucose, Bld: 99 mg/dL (ref 70–99)
Potassium: 3.5 mmol/L (ref 3.5–5.1)
Sodium: 135 mmol/L (ref 135–145)

## 2022-07-25 LAB — GLUCOSE, CAPILLARY
Glucose-Capillary: 112 mg/dL — ABNORMAL HIGH (ref 70–99)
Glucose-Capillary: 121 mg/dL — ABNORMAL HIGH (ref 70–99)
Glucose-Capillary: 125 mg/dL — ABNORMAL HIGH (ref 70–99)
Glucose-Capillary: 136 mg/dL — ABNORMAL HIGH (ref 70–99)
Glucose-Capillary: 93 mg/dL (ref 70–99)
Glucose-Capillary: 98 mg/dL (ref 70–99)

## 2022-07-25 LAB — MAGNESIUM: Magnesium: 2.3 mg/dL (ref 1.7–2.4)

## 2022-07-25 LAB — BRAIN NATRIURETIC PEPTIDE: B Natriuretic Peptide: 532.8 pg/mL — ABNORMAL HIGH (ref 0.0–100.0)

## 2022-07-25 MED ORDER — NEPRO/CARBSTEADY PO LIQD
237.0000 mL | Freq: Two times a day (BID) | ORAL | Status: DC
  Administered 2022-07-25 – 2022-07-26 (×2): 237 mL via ORAL

## 2022-07-25 MED ORDER — RENA-VITE PO TABS
1.0000 | ORAL_TABLET | Freq: Every day | ORAL | Status: DC
  Administered 2022-07-26 – 2022-07-27 (×2): 1 via ORAL
  Filled 2022-07-25 (×2): qty 1

## 2022-07-25 MED ORDER — MAGNESIUM HYDROXIDE 400 MG/5ML PO SUSP: 30.0000 mL | Freq: Once | ORAL | Status: DC

## 2022-07-25 NOTE — Progress Notes (Signed)
Initial Nutrition Assessment  DOCUMENTATION CODES:  Not applicable  INTERVENTION:  Continue soft diet, change to ordering assist as pt is having difficult seeing RN to assist with tray set-up Will attach diet education for pt's reference to AVS Renavite daily for micronutrient losses with HD Nepro Shake po 2x/d, each supplement provides 425 kcal and 19 grams protein  NUTRITION DIAGNOSIS:  Increased nutrient needs related to chronic illness (ESRD on HD) as evidenced by estimated needs.  GOAL:  Patient will meet greater than or equal to 90% of their needs  MONITOR:   PO intake, Weight trends, Labs, I & O's, Diet advancement  REASON FOR ASSESSMENT:   Malnutrition Screening Tool    ASSESSMENT:   Pt with hx of ESRD on HD, GERD, HTN, HLD, DM type 2, presented to ED with progressive symptoms of blurry vision, dizziness, N/V, and a syncopal episode at HD.  Pt resting in bed at the time of assessment, states all she has had today is jello, unaware that diet had been advanced. Assisted with placing her lunch order and will add ordering assist as pt is having a hard time reading her menu.   Talked with pt about the reason for a soft diet and encouraged her to eat slowly to avoid GI distress. Discussed that typically this diet was recommend for ~2 weeks after discharge and then she could slowly start adding in fibrous foods. Will add education to pt's AVS. Pt also concerned about eating "non-diabetic" food and also foods that are not allowed on her HD diet. Encouraged pt to focus on increasing her intake and then going back to these restrictions when she is eating at her baseline.  Pt reports that she was eating poorly at her facility PTA and that she has lost weight. Significant muscle wasting seen in the lower extremities, but pt reports that she uses a power-wheelchair at baseline. Excess adipose tissue and edema likely obscuring other signs of depletions. 12.5% weight loss noted over the  last 6 months which is severe during this timeframe.   OP HD: MWF, 3.5 hours EDW: 70kg Access: RUA AVF  Last HD 10/13  post wt 66.7kg Total UF 2L  Nutritionally Relevant Medications: Scheduled Meds:  atorvastatin  20 mg Oral Daily   docusate sodium  200 mg Oral BID   doxercalciferol  2 mcg Intravenous Q M,W,F-HD   insulin aspart  0-6 Units Subcutaneous Q4H   magnesium hydroxide  30 mL Oral Once   mirtazapine  15 mg Oral QHS   pantoprazole IV  40 mg Intravenous Q12H   polyethylene glycol  17 g Oral BID   Continuous Infusions:  cefTRIAXone (ROCEPHIN)  IV 2 g (07/24/22 2133)   metronidazole 500 mg (07/24/22 2231)   PRN Meds: ondansetron, simethicone  Labs Reviewed: Chloride 97 Creatinine 4.11 Phosphorus 2.4 (10/13)  Micronutrient Labs: Folate 3.4 (low) Vitamin B12 264 (WNL)  NUTRITION - FOCUSED PHYSICAL EXAM: Flowsheet Row Most Recent Value  Orbital Region No depletion  Upper Arm Region No depletion  Thoracic and Lumbar Region No depletion  Buccal Region No depletion  Temple Region No depletion  Clavicle Bone Region No depletion  Clavicle and Acromion Bone Region No depletion  Scapular Bone Region No depletion  Dorsal Hand Mild depletion  Patellar Region Severe depletion  Anterior Thigh Region Severe depletion  Posterior Calf Region Severe depletion  Edema (RD Assessment) Moderate  [generalized]  Hair Reviewed  Eyes Reviewed  Mouth Reviewed  Skin Reviewed  Nails Reviewed  Diet Order:   Diet Order             DIET SOFT Room service appropriate? Yes with Assist; Fluid consistency: Thin  Diet effective now                   EDUCATION NEEDS:  Education needs have been addressed  Skin:  Skin Assessment: Reviewed RN Assessment  Last BM:  10/15  Height:  Ht Readings from Last 1 Encounters:  07/22/22 5' (1.524 m)    Weight:  Wt Readings from Last 1 Encounters:  07/22/22 70.2 kg    Ideal Body Weight:  45.5 kg  BMI:  Body mass index is  30.23 kg/m.  Estimated Nutritional Needs:  Kcal:  1700-1900 kcal/d Protein:  85-100g/d Fluid:  1L+UOP    Ranell Patrick, RD, LDN Clinical Dietitian RD pager # available in AMION  After hours/weekend pager # available in [facility]

## 2022-07-25 NOTE — Progress Notes (Signed)
[facility] Progress Note   Subjective:   Patient seen and examined at bedside.  Denies CP, SOB, abdominal and n/v.  States when she looks down it make her feel lightheaded and a little nauseated.  Unsure if diarrhea has improved.  Feels like she needs to go to the bathroom now - nurse notified. Hoping to have diet advanced today.   Objective Vitals:   07/24/22 1606 07/24/22 2035 07/25/22 0440 07/25/22 0721  BP: 129/75 (!) 133/53 (!) 147/60 (!) 145/59  Pulse: 61 67 61 65  Resp: '18 18 18 16  '$ Temp: (!) 97.2 F (36.2 C) 98.3 F (36.8 C) 98.1 F (36.7 C) 98.1 F (36.7 C)  TempSrc:  Oral Oral Oral  SpO2: 95% 92% 96% 100%  Weight:      Height:       Physical Exam General:chronically ill appearing female in NAD Heart:RRR, no mrg Lungs:mostly CTAB, +scattered rhonchi, nml WOB on RA Abdomen:soft, NTND Extremities:trace LE edema Dialysis Access: RU AVF +b/t   Filed Weights   07/22/22 2013 07/22/22 2040 07/22/22 2242  Weight: 66.6 kg 66.6 kg 70.2 kg    Intake/Output Summary (Last 24 hours) at 07/25/2022 0939 Last data filed at 07/25/2022 0537 Gross per 24 hour  Intake 460 ml  Output 300 ml  Net 160 ml    Additional Objective Labs: Basic Metabolic Panel: Recent Labs  Lab 07/22/22 1716 07/23/22 0225 07/24/22 0337 07/25/22 0240  NA  --  133* 135 135  K  --  3.3* 3.0* 3.5  CL  --  99 96* 97*  CO2  --  '27 28 29  '$ GLUCOSE  --  121* 96 99  BUN  --  6* 11 14  CREATININE  --  2.37* 3.43* 4.11*  CALCIUM  --  7.8* 8.4* 8.3*  PHOS 2.4*  --   --   --    Liver Function Tests: Recent Labs  Lab 07/21/22 1538  AST 9*  ALT <5  ALKPHOS 102  BILITOT 0.5  PROT 5.6*  ALBUMIN 2.9*   CBC: Recent Labs  Lab 07/21/22 1538 07/22/22 0438 07/23/22 0225 07/23/22 0938 07/23/22 0950 07/24/22 0337 07/25/22 0240  WBC 11.1* 9.2 9.7  --   --  8.3 8.8  NEUTROABS 9.7*  --  8.1*  --   --  6.5 7.1  HGB 7.8* 7.1* 6.8*  --  8.9* 8.9* 9.0*  HCT 26.3* 24.0* 22.8*  --   27.8* 28.0* 29.1*  MCV 87.4 87.0 85.7  --   --  85.4 86.6  PLT 241 231 200 170  --  184 199   CBG: Recent Labs  Lab 07/24/22 2037 07/24/22 2259 07/25/22 0044 07/25/22 0441 07/25/22 0814  GLUCAP 114* 91 93 98 112*   Iron Studies:  Recent Labs    07/22/22 1716  IRON 50  TIBC 130*  FERRITIN 883*   Lab Results  Component Value Date   INR 1.3 (H) 07/23/2022   INR 1.5 (H) 01/09/2022   INR 1.65 03/30/2017   Studies/Results: DG Abd Portable 1V  Result Date: 07/24/2022 CLINICAL DATA:  76 year old female with nausea. Sigmoid diverticulitis with suspected abscess. EXAM: PORTABLE ABDOMEN - 1 VIEW COMPARISON:  Recent CT Abdomen and Pelvis 07/21/2022. FINDINGS: Portable AP supine views at 0820 hours. Negative lung bases. Nonobstructed bowel gas pattern, with a greater paucity of bowel gas compared to 07/21/2022. No pneumoperitoneum is evident on these supine views. Dystrophic pelvic calcifications redemonstrated. Severe right hip joint degeneration. No acute osseous  abnormality identified. IMPRESSION: Nonobstructed bowel-gas pattern with no pneumoperitoneum evident on these supine images. Electronically Signed   By: Genevie Ann M.D.   On: 07/24/2022 08:50    Medications:  cefTRIAXone (ROCEPHIN)  IV 2 g (07/24/22 2133)   metronidazole 500 mg (07/24/22 2231)    sodium chloride   Intravenous Once   atorvastatin  20 mg Oral Daily   [START ON 07/27/2022] buprenorphine  1 patch Transdermal Weekly   carbidopa-levodopa  1 tablet Oral BID   carbidopa-levodopa  2 tablet Oral q AM   Chlorhexidine Gluconate Cloth  6 each Topical Q0600   Chlorhexidine Gluconate Cloth  6 each Topical Q0600   docusate sodium  200 mg Oral BID   doxercalciferol  2 mcg Intravenous Q M,W,F-HD   fluticasone  1 spray Each Nare BID   gabapentin  300 mg Oral QHS   insulin aspart  0-6 Units Subcutaneous Q4H   magnesium hydroxide  30 mL Oral Once   melatonin  5 mg Oral QHS   midodrine  20 mg Oral Q M,W,F-HD   mirtazapine   15 mg Oral QHS   nystatin  1 Application Topical J2820   pantoprazole (PROTONIX) IV  40 mg Intravenous Q12H   polyethylene glycol  17 g Oral BID   rOPINIRole  12 mg Oral Daily   tamsulosin  0.4 mg Oral QPC supper    Dialysis Orders: MWF [facility]  3.5h  400/1.5  70kg   2/2 bath  Hep none  RUA AVF - last HD 10/11 post wt 71.1kg - last Hb 7.3 - mircera 225 ug q 2, last 10/9 - calcitriol 0.5 ug po tiw mwf     CXR - no active disease     Problem/Plan: Diverticulitis with small abscess: surgery consulted no need for surgery currently IV antibiotics (Rocephin and metronidazole )and liquid diet ESRD -HD MWF on schedule K 3.0, p.o. supplement per admit team, Plan for HD today using 4K bath.  HTN/volume: history of chronic hypotension on midodrine 20 mg predialysis .  Does not appear volume overloaded, plan for UF as tolerated.  Anemia of chronic disease/acute illness:Hgb^8.9, improved from 6.8, status post 1 unit RBC on 10/14, TSAT 38%,  continue high-dose ESA next due 10/23, transfuse  prn Hgb< 7 Secondary hyperparathyroidism - CCa  in range, phosphorus 2.4 holding binder (calcium acetate ). Restart once on regular diet.  IDDM= per admit team Nutrition - on clear liquid diet. Alb 2.9. Add protein supplement when diet advanced.  Renal diet w/fluid restrictions once advanced.  Hx DVT - anticoagulation on hold.   Jen Mow, PA-C [facility] 07/25/2022,9:39 AM  LOS: 3 days

## 2022-07-25 NOTE — Discharge Instructions (Signed)
Low Fiber Nutrition Therapy   You may need a low-fiber diet if you have diverticulitis.  A low-fiber diet reduces the frequency and volume of your stools. This lessens irritation to the gastrointestinal (GI) tract and can help you heal. The goal of this diet is to get less than 13 grams of fiber daily. It's also important to eat enough protein foods while you are on a low-fiber diet. Usually this diet is recommend for about 2 weeks, ask your provider when it is recommended that you begin adding more fiber into your diet.  Drink nutrition supplements that have 1 gram of fiber or less in each serving. If your stricture is severe or if your inflammation is severe, drink more liquids to reduce symptoms and to get enough calories and protein.  Tips Eat about 5 to 6 small meals daily or about every 3 to 4 hours. Do not skip meals.  Every time you eat, include a small amount of protein (1 to 2 ounces) plus an additional food. Low fiber starch foods are the best choice to eat with protein.  Limit acidic, spicy and high-fat or fried and greasy foods to reduce GI symptoms.  Do not eat raw fruits and vegetables while on this diet. All fruits and vegetables need to be cooked and without peels or skins.  Drink a lot of fluids, at least 8 cups of fluid each day. Limit drinks with caffeine, sugar, and sugar substitutes.  Plain water is the best choice. Avoid mixing drink packets or flavor drops into water. .  Take a chewable multivitamin with minerals. Gummy vitamins do not have enough minerals and can block an ostomy and non-chewable supplements are not easily digested. Chewable supplements must be used if you have a stricture or ostomy.  If you are lactose intolerant, you may need to eat low-lactose dairy products. If you can't tolerate dairy, ask your RDN about how you can get enough calcium from other foods.  Do not add more fiber to your diet until your health care provider or registered dietitian  nutritionist (RDN) tells you it's OK. Fiber is part of whole grains, fruits and vegetables (foods from plants) and needs to be slowly added back in to your diet when your body is healed.  Choose foods that have been safely handled and prepared to lower your risk of foodborne illness. Talk to your RDN or see the Food Safety Nutrition Therapy handout for more information.   Foods Recommended These foods are low in fat and fiber and will help with your GI symptoms. Food Group Foods Recommended  Grains  Choose grain foods with less than 2 grams of fiber per serving. Refined white flour products--for example, enriched white bread without seeds, crackers or pasta Cream of wheat or rice Grits (fine ground) Tortillas: white flour or corn White rice, well-cooked (do not rinse, or soak before cooking) Cold and hot cereals made from white or refined flour such as puffed rice or corn flakes  Protein Foods  Lean, very tender, well-cooked poultry or fish; red meats: beef, pork or lamb (slow cook until soft; chop meats if you have stricture or ostomy) Eggs, well-cooked Smooth nut butters such as almond, peanut, or sunflower Tofu  Dairy  If you have lactose intolerance, drinking milk products from cows or goats may make diarrhea worse. Foods marked with an asterisk (*) have lactose. Milk: fat-free, 1% or 2% * (choose best tolerated) Lactose-free milk Buttermilk* Fortified non-dairy milks: almond, cashew, coconut, or rice (be  aware that these options are not good sources of protein so you will need to eat an additional protein food) Kefir* (Don't include kefir in the diet until approved by your health care provider) Yogurt*/lactose-free yogurt (without nuts, fruit, granola or chocolate) Mild cheese* (hard and aged cheeses tend to be lower in lactose such as cheddar, swiss or parmesan) Cottage cheese* or lactose-free cottage cheese Low-fat ice cream* or lactose-free ice cream Sherbet* (usually lower  lactose)  Vegetables  Canned and well-cooked vegetables without seeds, skins, or hulls  Carrots or green beans, cooked White, red or yellow potatoes without skins Strained vegetable juice  Fruit Soft, and well-cooked fruits without skins, seeds, or membranes Canned fruit in juice: peaches, pears, or applesauce Fruit juice without pulp diluted by half with water may be tolerated better Fruit drinks fortified with vitamin C may be tolerated better than 100% fruit juice  Oils  When possible, choose healthy oils and fats, such as olive and canola oils, plant oils rather than solid fats.  Other  Broth and strained soups made from allowed foods Desserts (small portions) without whole grains, seeds, nuts, raisins, or coconut Jelly (clear)   Foods Not Recommended These foods are higher in fat and fiber and may make your GI symptoms worse.  Food Group Foods Not Recommended  Grains  Bread, whole wheat or with whole grain flour or seeds or nuts Brown rice, quinoa, kasha, barley Tortillas: whole grain Whole wheat pasta Whole grain and high-fiber cereals, including oatmeal, bran flakes or shredded wheat Popcorn  Protein Foods  Steak, pork chops, or other meats that are fatty or have gristle Fried meat, poultry, or fish Seafood with a tough or rubbery texture, such as shrimp Luncheon meats such as bologna and salami Sausage, bacon, or hot dogs Dried beans, peas, or lentils Hummus Sushi Nuts and chunky nut butters  Dairy  Whole milk Pea milk and soymilk (may cause diarrhea, gas, bloating, and abdominal pain) Cream Half-and-half Sour cream Yogurt with added fruit, nuts, or granola or chocolate  Vegetables  Alfalfa or bean sprouts (high fiber and risk for bacteria) Raw or undercooked vegetables: beets; broccoli; brussels sprouts; cabbage; cauliflower; collard, mustard, or turnip greens; corn; cucumber; green peas or any kind of peas; kale; lima beans; mushrooms; okra; olives; pickles and  relish; onions; parsnips; peppers; potato skins; sauerkraut; spinach; tomatoes  Fruit Raw fruit Dried fruit Avocado, berries, coconut Canned fruit in syrup Canned fruit with mandarin oranges, papaya or pineapple Fruit juice with pulp Prune juice Fruit skin  Oils  Pork rinds   Low-Fiber (8 grams) Sample 1-Day Menu  Breakfast  cup cream of wheat (0.5 gram fiber)  1 slice white toast (1 gram fiber)  1 teaspoon margarine, soft tub  2 scrambled eggs   Morning Snack 1 cup lactose-free nutrition supplement  Lunch 2 slices white bread (2 grams fiber)  3 tablespoons tuna  1 tablespoon mayonnaise  1 cup chicken noodle soup (1 gram fiber)   cup apple juice   Afternoon Snack 6 saltine crackers (0.5 gram fiber)  2 ounces low-fat cheddar cheese  Evening Meal 3 ounces tender chicken breast  1 cup white rice (0.5 gram fiber)   cup cooked canned green beans (2 grams fiber)   cup cranberry juice   Evening Snack 1 cup lactose-free nutrition supplement   Tips for Adding Fiber back into Your Eating Plan - Wait for your provider to advise this Slowly increase the amount of fiber you eat to 25 to 35  grams per day. Eat whole grain breads and cereals. Look for choices with 100% whole wheat, rye, oats, or bran as the first or second ingredient. Have brown or wild rice instead of white rice or potatoes. Enjoy a variety of grains. Good choices include barley, oats, farro, kamut, and quinoa. Bake with whole wheat flour. You can use it to replace some white or all-purpose flour in recipes. Enjoy baked beans more often! Add dried beans and peas to casseroles or soups. Choose fresh fruit and vegetables instead of juices. Eat fruits and vegetables with peels or skins on. Compare food labels of similar foods to find higher fiber choices. On packaged foods, the amount of fiber per serving is listed on the Nutrition Facts label. Check the Nutrition Facts labels and try to choose products with at least 4 g  dietary fiber per serving. Foods Recommended Foods With at Least 4 g Fiber per Serving Food Group Choose  Grains ?- cup high-fiber cereal  Dried beans and peas  cup cooked red beans, kidney beans, large lima beans, navy beans, pinto beans, white beans, lentils, or black-eyed peas  Vegetables 1 artichoke (cooked)  Fruits  cup blackberries or raspberries 4 dried prunes   Foods With 1 to 3 g Fiber per Serving Food Group Choose  Grains 1 bagel (0.2-RKYH diameter) 1 slice whole wheat, cracked wheat, pumpernickel, or rye bread 2-inch square cornbread 4 whole wheat crackers 1 bran, blueberry, cornmeal, or English muffin  cup cereal with 1-3 g fiber per serving (check dietary fiber on the product's Nutrition Facts label) 2 tablespoons wheat germ or whole wheat flour  Fruits 1 apple (3-inch diameter) or  cup applesauce  cup apricots (canned) 1 banana  cup cherries (canned or fresh)  cup cranberries (fresh) 3 dates 2 medium figs (fresh)  cup fruit cocktail (canned)  grapefruit 1 kiwi fruit 1 orange (2-inch diameter) 1 peach (fresh) or  cup peaches (canned) 1 pear (fresh) or  cup pears (canned) 1 plum (2-inch diameter)  cup raisins  cup strawberries (fresh) 1 tangerine  Vegetables  cup bean sprouts (raw)  cup beets (diced, canned)  cup broccoli, brussels sprouts, or cabbage  (cooked)  cup carrots  cup cauliflower  cup corn  cup eggplant  cup okra (boiled)  cup potatoes (baked or mashed)  cup spinach, kale, or turnip greens (cooked)  cup squash--winter, summer, or zucchini (cooked)  cup sweet potatoes or yams  cup tomatoes (canned)  Other 2 tablespoons almonds or peanuts 1 cup popcorn (popped)    High Fiber Sample 1-Day Menu  Breakfast 1/2 cup orange juice, with pulp  1/2 cup raisin bran 1 cup fat-free milk 1 cup coffee  Morning Snack 1 cup plain yogurt  2 cups water  Lunch 1 1/2 cups chili  1/2 cup kidney beans 1/2 cup soy crumble 2  tablespoons shredded cheese 8 whole wheat crackers 1 apple (with skin)   Evening Meal 2 ounces sliced chicken  1/4 cup tofu 2 cups mixed fresh vegetables 1 cup brown rice 1/2 cup strawberries 1 cup hot tea  Evening Snack 2 tablespoons almonds  1 cup hot chocolate   Copyright 2020  Academy of Nutrition and Dietetics

## 2022-07-25 NOTE — Progress Notes (Signed)
[facility] Progress Note     Subjective: CC:  Denies abd pain. Reports 2 months of nausea that is worse with laying flat and bending over. Tolerating liquid diet without emesis. +flatus and BMs.   Objective: Vital signs in last 24 hours: Temp:  [97.2 F (36.2 C)-98.3 F (36.8 C)] 98.1 F (36.7 C) (10/16 0721) Pulse Rate:  [61-67] 65 (10/16 0721) Resp:  [16-18] 16 (10/16 0721) BP: (129-147)/(53-75) 145/59 (10/16 0721) SpO2:  [92 %-100 %] 100 % (10/16 0721) Last BM Date : 07/24/22  Intake/Output from previous day: 10/15 0701 - 10/16 0700 In: 460 [P.O.:460] Out: 300 [Urine:300] Intake/Output this shift: No intake/output data recorded.  PE: Gen:  Alert, NAD, pleasant Card:  Regular rate and rhythm, pedal pulses 2+ BL Pulm:  Normal effort, clear to auscultation bilaterally Abd: Soft, non-tender, non-distended, no hernias Skin: warm and dry, no rashes  Psych: A&Ox3   Lab Results:  Recent Labs    07/24/22 0337 07/25/22 0240  WBC 8.3 8.8  HGB 8.9* 9.0*  HCT 28.0* 29.1*  PLT 184 199   BMET Recent Labs    07/24/22 0337 07/25/22 0240  NA 135 135  K 3.0* 3.5  CL 96* 97*  CO2 28 29  GLUCOSE 96 99  BUN 11 14  CREATININE 3.43* 4.11*  CALCIUM 8.4* 8.3*   PT/INR Recent Labs    07/23/22 0938  LABPROT 15.8*  INR 1.3*   CMP     Component Value Date/Time   NA 135 07/25/2022 0240   K 3.5 07/25/2022 0240   CL 97 (L) 07/25/2022 0240   CO2 29 07/25/2022 0240   GLUCOSE 99 07/25/2022 0240   BUN 14 07/25/2022 0240   CREATININE 4.11 (H) 07/25/2022 0240   CALCIUM 8.3 (L) 07/25/2022 0240   PROT 5.6 (L) 07/21/2022 1538   ALBUMIN 2.9 (L) 07/21/2022 1538   AST 9 (L) 07/21/2022 1538   ALT <5 07/21/2022 1538   ALKPHOS 102 07/21/2022 1538   BILITOT 0.5 07/21/2022 1538   GFRNONAA 11 (L) 07/25/2022 0240   GFRAA 9 (L) 07/03/2020 1218   Lipase     Component Value Date/Time   LIPASE 65 (H) 08/13/2007 1946       Studies/Results: DG Abd Portable  1V  Result Date: 07/24/2022 CLINICAL DATA:  76 year old female with nausea. Sigmoid diverticulitis with suspected abscess. EXAM: PORTABLE ABDOMEN - 1 VIEW COMPARISON:  Recent CT Abdomen and Pelvis 07/21/2022. FINDINGS: Portable AP supine views at 0820 hours. Negative lung bases. Nonobstructed bowel gas pattern, with a greater paucity of bowel gas compared to 07/21/2022. No pneumoperitoneum is evident on these supine views. Dystrophic pelvic calcifications redemonstrated. Severe right hip joint degeneration. No acute osseous abnormality identified. IMPRESSION: Nonobstructed bowel-gas pattern with no pneumoperitoneum evident on these supine images. Electronically Signed   By: Genevie Ann M.D.   On: 07/24/2022 08:50    Anti-infectives: Anti-infectives (From admission, onward)    Start     Dose/Rate Route Frequency Ordered Stop   07/22/22 0015  cefTRIAXone (ROCEPHIN) 2 g in sodium chloride 0.9 % 100 mL IVPB        2 g 200 mL/hr over 30 Minutes Intravenous Daily at 10 pm 07/22/22 0006     07/22/22 0015  metroNIDAZOLE (FLAGYL) IVPB 500 mg        500 mg 100 mL/hr over 60 Minutes Intravenous 2 times daily 07/22/22 0006     07/22/22 0000  piperacillin-tazobactam (ZOSYN) IVPB 3.375 g  Status:  Discontinued  3.375 g 100 mL/hr over 30 Minutes Intravenous  Once 07/21/22 2349 07/22/22 0006        Assessment/Plan  Diverticulitis with small intramural abscess, without perforation - non-tender, afebrile, WBC 8.8  - advance to solid diet - recommend total for 14 days abx - recommend referral to GI for outpatient colonoscopy in 6 weeks - ok to resume her anticoagulation for hx DVT if recommended by primary team - CCS will sign off, no acute surgical needs. noted plans for discharge tomorrow, please call us back as needed.     LOS: 3 days   I reviewed nursing notes, hospitalist notes, last 24 h vitals and pain scores, last 48 h intake and output, last 24 h labs and trends, and last 24 h imaging  results.   Obie Dredge, PA-C [facility] Please see Amion for pager number during day hours 7:00am-4:30pm

## 2022-07-25 NOTE — Progress Notes (Signed)
Mobility Specialist - Progress Note   07/25/22 1000  Mobility  Activity Transferred to/from [facility]  Level of Assistance Contact guard assist, steadying assist  Assistive Device Front wheel walker  Distance Ambulated (ft) 2 ft  Activity Response Tolerated well  $Mobility charge 1 Mobility    Pt received in bed requesting to use BSC. Left on [facility] w/ NT notified and all needs met.   Paulla Dolly Mobility Specialist

## 2022-07-25 NOTE — TOC Progression Note (Signed)
Transition of Care [facility]) - Progression Note    Patient Details  Name: [name] MRN: [id number] Date of Birth: [birth year]  Transition of Care [facility]) CM/SW Contact  Reece Agar, [place] Phone Number: 07/25/2022, 11:24 AM  Clinical Narrative:    CSW spoke with pt at bedside she does not want to go to SNF due to a bad experience in the past. Pt stated that she would like to go back to [place] and continue PT there with [facility]. Pt stated she worked with [employer] twice a week and sometimes on the weekend bc of HD.   CSW contacted Rolena Infante to provide update on pt decision to return with PT/OT, no answer CSW left a message.   CSW will continue to follow.   Expected Discharge Plan: [place] Barriers to Discharge: Continued Medical Work up  Expected Discharge Plan and Services Expected Discharge Plan: [place] arrangements for the past 2 months: [place]                                       Social Determinants of Health (SDOH) Interventions    Readmission Risk Interventions    01/25/2020    8:31 AM  Readmission Risk Prevention Plan  Transportation Screening Complete  PCP or Specialist Appt within 5-7 Days Not Complete  Not Complete comments d/c to SNF  Home Care Screening Complete  Medication Review (RN CM) Referral to Pharmacy

## 2022-07-25 NOTE — Progress Notes (Signed)
PROGRESS NOTE                                                                                                                                                                                                             Patient Demographics:    Oney Tatlock, is a 76 y.o. female, DOB - 1945-10-20, YBO:[id number]  Outpatient Primary MD for the patient is Pcp, No    LOS - 3  Admit date - 07/21/2022    Chief Complaint  Patient presents with   Hypotension   Blurred Vision       Brief Narrative (HPI from H&P)   76 y.o. female with medical history significant of ESRD on HD, full treatment yesterday, DM2, prior DVT on eliquis, Parkinson's, chronic hypotension on midodrine on her dialysis days, who lives at an SNF and has been having blurry vision for the last several months was brought in after she became severely hypotensive with blurry vision, systolic blood pressure was in 70s she was brought to the ER where work-up showed asymptomatic diverticular abscess however she had significant drop in her hemoglobin from a baseline of about 12-8, no known blood in stool or melena, initial Hemoccult negative.   Subjective:   Patient in bed, appears comfortable, denies any headache, no fever, no chest pain or pressure, no shortness of breath , no abdominal pain. No focal weakness.  Had multiple bowel movements since 07/24/2022 and feels much better.   Assessment  & Plan :    Symptomatic hypotension and anemia in a patient with incidental finding of diverticular abscess, underlying history of anemia of chronic disease and ESRD. She is surprisingly pain and fever free, question the diagnosis of diverticular abscess, seen by general surgery and currently on clear liquid diet and IV antibiotics.  Due to her anemia she received 1 unit of packed RBC transfusion on 07/23/2022, stable posttransfusion H&H, LDH haptoglobin stable.  No known GI bleed  however due to her diverticular abscess could have had some intermittent occult blood loss.  Continue to monitor.  General surgery on board.  We will request general surgery to provide their opinion on oral antibiotic transition and commencement of anticoagulation if needed for history of DVT.  Likely discharge on 07/26/2022.  Acute anemia of unclear etiology in a patient with anemia of chronic disease.  Currently see #1  above.  Ongoing blurry vision for several months.  MRI brain negative, probably got worsened due to hypotension, now close to baseline, outpatient ophthalmology follow-up.  ESRD.  On MWF schedule.  Nephrology consulted.  For mild hypokalemia given 20 mEq of potassium on 07/24/2022.  Hypotension.  Scheduled midodrine on the days of dialysis, as needed otherwise.  Stable cortisol, slightly suppressed TSH stable echocardiogram with preserved EF of 60%, no wall motion abnormalities or significant valvular problems.  Mildly suppressed TSH.  Could be sick euthyroid, check free T4 and T3.  Kidney lesion - Mildly increased in size since 2021. Consider follow up MRI abd in 6 months arranged by PCP.  HX of Parkinson's.  Continue home medications.    History of DVT.  Patient thinks it several months ago.  Negative venous duplex now this admission, hold anticoagulation for now due to severe anemia.  We will have her address with PCP for long-term anticoagulation.  Constipation.  Placed on bowel regimen.    Insulin-requiring or dependent type II diabetes mellitus ([place]) - Very sensitive SSI Q4H for the moment.  Lab Results  Component Value Date   HGBA1C 6.9 (H) 07/22/2022   CBG (last 3)  Recent Labs    07/25/22 0044 07/25/22 0441 07/25/22 0814  GLUCAP 93 98 112*        Condition - Extremely Guarded  Family Communication  :  None  Code Status :  DNR  Consults  :  CCS, Renal, GI  PUD Prophylaxis : PPI   Procedures  :     Leg [place] - No evidence of deep vein thrombosis  seen in the lower extremities, bilaterally. -No evidence of popliteal cyst, bilaterally.    TTE - 1. Left ventricular ejection fraction, by estimation, is 60 to 65%. The left ventricle has normal function. The left ventricle has no regional wall motion abnormalities. There is mild left ventricular hypertrophy. Left ventricular diastolic parameters are consistent with Grade I diastolic dysfunction (impaired relaxation).  2. Right ventricular systolic function is normal. The right ventricular size is normal. Tricuspid regurgitation signal is inadequate for assessing PA pressure.  3. Left atrial size was moderately dilated.  4. Right atrial size was mildly dilated.  5. Trivial mitral valve regurgitation.  6. There is moderate calcification of the aortic valve. Aortic valve regurgitation is not visualized. Mild aortic valve stenosis.  7. The inferior vena cava is normal in size with greater than 50% respiratory variability, suggesting right atrial pressure of 3 mmHg.  MRI - 1. No acute intracranial abnormality. 2. Findings of chronic small vessel ischemia  CT -  Acute sigmoid diverticulitis with suspected 2.5 cm intramural abscess. No free air. 16 mm hyperdense lesion in the anterior right upper kidney, mildly increased from 2021. Differential considerations include benign hemorrhagic cyst versus small papillary renal cell carcinoma. Consider follow-up MRI abdomen with/without contrast in 6 months. Additional ancillary findings as above      Disposition Plan  :    Status is: Observation  DVT Prophylaxis  :    Place and maintain sequential compression device Start: 07/22/22 0553   Lab Results  Component Value Date   PLT 199 07/25/2022    Diet :  Diet Order             DIET SOFT Room service appropriate? Yes; Fluid consistency: Thin  Diet effective now                    Inpatient Medications  Scheduled  Meds:  sodium chloride   Intravenous Once   atorvastatin  20 mg Oral Daily    [START ON 07/27/2022] buprenorphine  1 patch Transdermal Weekly   carbidopa-levodopa  1 tablet Oral BID   carbidopa-levodopa  2 tablet Oral q AM   Chlorhexidine Gluconate Cloth  6 each Topical Q0600   Chlorhexidine Gluconate Cloth  6 each Topical Q0600   docusate sodium  200 mg Oral BID   doxercalciferol  2 mcg Intravenous Q M,W,F-HD   fluticasone  1 spray Each Nare BID   gabapentin  300 mg Oral QHS   insulin aspart  0-6 Units Subcutaneous Q4H   magnesium hydroxide  30 mL Oral Once   melatonin  5 mg Oral QHS   midodrine  20 mg Oral Q M,W,F-HD   mirtazapine  15 mg Oral QHS   nystatin  1 Application Topical V8938   pantoprazole (PROTONIX) IV  40 mg Intravenous Q12H   polyethylene glycol  17 g Oral BID   rOPINIRole  12 mg Oral Daily   tamsulosin  0.4 mg Oral QPC supper   Continuous Infusions:  cefTRIAXone (ROCEPHIN)  IV 2 g (07/24/22 2133)   metronidazole 500 mg (07/25/22 1026)   PRN Meds:.acetaminophen **OR** acetaminophen, albuterol, camphor-menthol, Glycerin (Adult), hydrOXYzine, midodrine, [DISCONTINUED] ondansetron **OR** ondansetron (ZOFRAN) IV, simethicone     Objective:   Vitals:   07/24/22 1606 07/24/22 2035 07/25/22 0440 07/25/22 0721  BP: 129/75 (!) 133/53 (!) 147/60 (!) 145/59  Pulse: 61 67 61 65  Resp: '18 18 18 16  '$ Temp: (!) 97.2 F (36.2 C) 98.3 F (36.8 C) 98.1 F (36.7 C) 98.1 F (36.7 C)  TempSrc:  Oral Oral Oral  SpO2: 95% 92% 96% 100%  Weight:      Height:        Wt Readings from Last 3 Encounters:  07/22/22 70.2 kg  06/21/22 73.9 kg  02/11/22 80.4 kg     Intake/Output Summary (Last 24 hours) at 07/25/2022 1031 Last data filed at 07/25/2022 0537 Gross per 24 hour  Intake 460 ml  Output 300 ml  Net 160 ml     Physical Exam  Awake Alert, No new F.N deficits, Normal affect [place].AT,PERRAL Supple Neck, No JVD,   Symmetrical Chest wall movement, Good air movement bilaterally, CTAB RRR,No Gallops, Rubs or new Murmurs,  +ve B.Sounds, Abd  Soft, No tenderness,   No Cyanosis, Clubbing or edema      Data Review:    CBC Recent Labs  Lab 07/21/22 1538 07/22/22 0438 07/23/22 0225 07/23/22 0938 07/23/22 0950 07/24/22 0337 07/25/22 0240  WBC 11.1* 9.2 9.7  --   --  8.3 8.8  HGB 7.8* 7.1* 6.8*  --  8.9* 8.9* 9.0*  HCT 26.3* 24.0* 22.8*  --  27.8* 28.0* 29.1*  PLT 241 231 200 170  --  184 199  MCV 87.4 87.0 85.7  --   --  85.4 86.6  MCH 25.9* 25.7* 25.6*  --   --  27.1 26.8  MCHC 29.7* 29.6* 29.8*  --   --  31.8 30.9  RDW 24.0* 24.0* 23.9*  --   --  20.8* 21.4*  LYMPHSABS 0.7  --  0.8  --   --  1.0 0.9  MONOABS 0.5  --  0.5  --   --  0.5 0.5  EOSABS 0.1  --  0.2  --   --  0.3 0.2  BASOSABS 0.0  --  0.0  --   --  0.0 0.0    Electrolytes Recent Labs  Lab 07/21/22 1538 07/21/22 1838 07/22/22 0438 07/22/22 1716 07/23/22 0225 07/23/22 0938 07/24/22 0337 07/25/22 0240  NA 138  --  138  --  133*  --  135 135  K 3.7  --  3.7  --  3.3*  --  3.0* 3.5  CL 97*  --  101  --  99  --  96* 97*  CO2 30  --  30  --  27  --  28 29  GLUCOSE 135*  --  105*  --  121*  --  96 99  BUN 9  --  11  --  6*  --  11 14  CREATININE 3.43*  --  3.71*  --  2.37*  --  3.43* 4.11*  CALCIUM 8.4*  --  8.0*  --  7.8*  --  8.4* 8.3*  AST 9*  --   --   --   --   --   --   --   ALT <5  --   --   --   --   --   --   --   ALKPHOS 102  --   --   --   --   --   --   --   BILITOT 0.5  --   --   --   --   --   --   --   ALBUMIN 2.9*  --   --   --   --   --   --   --   MG  --   --   --   --  1.4*  --  2.1 2.3  DDIMER  --   --   --   --   --  0.59*  --   --   INR  --   --   --   --   --  1.3*  --   --   TSH  --  0.241*  --  0.212*  --  0.291*  --   --   HGBA1C  --   --  6.9*  --   --   --   --   --   BNP  --   --   --   --  401.3*  --  515.4* 532.8*    ------------------------------------------------------------------------------------------------------------------ No results for input(s): "CHOL", "HDL", "LDLCALC", "TRIG", "CHOLHDL", "LDLDIRECT"  in the last 72 hours.  Lab Results  Component Value Date   HGBA1C 6.9 (H) 07/22/2022    Recent Labs    07/23/22 0938  TSH 0.291*   ------------------------------------------------------------------------------------------------------------------ ID Labs Recent Labs  Lab 07/21/22 1538 07/22/22 0438 07/23/22 0225 07/23/22 8341 07/24/22 0337 07/25/22 0240  WBC 11.1* 9.2 9.7  --  8.3 8.8  PLT 241 231 200 170 184 199  DDIMER  --   --   --  0.59*  --   --   CREATININE 3.43* 3.71* 2.37*  --  3.43* 4.11*   Cardiac Enzymes No results for input(s): "CKMB", "TROPONINI", "MYOGLOBIN" in the last 168 hours.  Invalid input(s): "CK"    Micro Results No results found for this or any previous visit (from the past 240 hour(s)).  Radiology Reports DG Abd Portable 1V  Result Date: 07/24/2022 CLINICAL DATA:  76 year old female with nausea. Sigmoid diverticulitis with suspected abscess. EXAM: PORTABLE ABDOMEN - 1 VIEW COMPARISON:  Recent CT Abdomen and Pelvis 07/21/2022. FINDINGS: Portable AP supine views  at 0820 hours. Negative lung bases. Nonobstructed bowel gas pattern, with a greater paucity of bowel gas compared to 07/21/2022. No pneumoperitoneum is evident on these supine views. Dystrophic pelvic calcifications redemonstrated. Severe right hip joint degeneration. No acute osseous abnormality identified. IMPRESSION: Nonobstructed bowel-gas pattern with no pneumoperitoneum evident on these supine images. Electronically Signed   By: Genevie Ann M.D.   On: 07/24/2022 08:50   VAS [place] LOWER EXTREMITY VENOUS (DVT)  Result Date: 07/22/2022  Lower Venous DVT Study Patient Name:  DOROTHYMAE MACIVER  Date of Exam:   07/22/2022 Medical Rec #: [id number]         Accession #:    [id number] Date of Birth: [birth year]          Patient Gender: F Patient Age:   56 years Exam Location:  [facility] Procedure:      VAS [place] LOWER EXTREMITY VENOUS (DVT) Referring Phys: Deno Etienne [facility]  --------------------------------------------------------------------------------  Indications: Swelling.  Risk Factors: LLE DVT 2018. Anticoagulation: Eliquis. Limitations: Poor ultrasound/tissue interface. Comparison Study: Previous exam on 10/10/19 was negative for DVT. Performing Technologist: Rogelia Rohrer RVT, RDMS  Examination Guidelines: A complete evaluation includes B-mode imaging, spectral Doppler, color Doppler, and power Doppler as needed of all accessible portions of each vessel. Bilateral testing is considered an integral part of a complete examination. Limited examinations for reoccurring indications may be performed as noted. The reflux portion of the exam is performed with the patient in reverse Trendelenburg.  +---------+---------------+---------+-----------+----------+-------------------+ RIGHT    CompressibilityPhasicitySpontaneityPropertiesThrombus Aging      +---------+---------------+---------+-----------+----------+-------------------+ CFV      Full           Yes      Yes                                      +---------+---------------+---------+-----------+----------+-------------------+ SFJ      Full                                                             +---------+---------------+---------+-----------+----------+-------------------+ FV Prox  Full           Yes      Yes                                      +---------+---------------+---------+-----------+----------+-------------------+ FV Mid   Full           Yes      Yes                                      +---------+---------------+---------+-----------+----------+-------------------+ FV DistalFull           Yes      Yes                                      +---------+---------------+---------+-----------+----------+-------------------+ PFV      Full                                                             +---------+---------------+---------+-----------+----------+-------------------+  POP      Full           Yes      Yes                                      +---------+---------------+---------+-----------+----------+-------------------+ PTV                                                   Not well visualized +---------+---------------+---------+-----------+----------+-------------------+ PERO                                                  Not well visualized +---------+---------------+---------+-----------+----------+-------------------+   +---------+---------------+---------+-----------+----------+-------------------+ LEFT     CompressibilityPhasicitySpontaneityPropertiesThrombus Aging      +---------+---------------+---------+-----------+----------+-------------------+ CFV      Full           Yes      Yes                                      +---------+---------------+---------+-----------+----------+-------------------+ SFJ      Full                                                             +---------+---------------+---------+-----------+----------+-------------------+ FV Prox  Full           Yes      Yes                                      +---------+---------------+---------+-----------+----------+-------------------+ FV Mid   Full           Yes      Yes                                      +---------+---------------+---------+-----------+----------+-------------------+ FV DistalFull           Yes      Yes                                      +---------+---------------+---------+-----------+----------+-------------------+ PFV      Full                                                             +---------+---------------+---------+-----------+----------+-------------------+ POP      Full           Yes      Yes                                      +---------+---------------+---------+-----------+----------+-------------------+  PTV      Full                                         Not well visualized  +---------+---------------+---------+-----------+----------+-------------------+ PERO     Full                                         Not well visualized +---------+---------------+---------+-----------+----------+-------------------+     Summary: BILATERAL: - No evidence of deep vein thrombosis seen in the lower extremities, bilaterally. -No evidence of popliteal cyst, bilaterally.   *See table(s) above for measurements and observations. Electronically signed by Monica Martinez MD on 07/22/2022 at 1:40:37 PM.    Final    ECHOCARDIOGRAM COMPLETE  Result Date: 07/22/2022    ECHOCARDIOGRAM REPORT   Patient Name:   CRYSTALEE VENTRESS Date of Exam: 07/22/2022 Medical Rec #:  [id number]        Height:       60.0 in Accession #:    [id number]       Weight:       163.0 lb Date of Birth:  1945-11-26         BSA:          2.711 m Patient Age:    34 years         BP:           120/49 mmHg Patient Gender: F                HR:           66 bpm. Exam Location:  Inpatient Procedure: 2D Echo, Cardiac Doppler and Color Doppler Indications:    Syncope R55  History:        Patient has prior history of Echocardiogram examinations, most                 recent 03/11/2017. Risk Factors:Hypertension, Sleep Apnea,                 Diabetes and Dyslipidemia.  Sonographer:    Ronny Flurry Referring Phys: [place]  1. Left ventricular ejection fraction, by estimation, is 60 to 65%. The left ventricle has normal function. The left ventricle has no regional wall motion abnormalities. There is mild left ventricular hypertrophy. Left ventricular diastolic parameters are consistent with Grade I diastolic dysfunction (impaired relaxation).  2. Right ventricular systolic function is normal. The right ventricular size is normal. Tricuspid regurgitation signal is inadequate for assessing PA pressure.  3. Left atrial size was moderately dilated.  4. Right atrial size was mildly dilated.  5. Trivial mitral valve  regurgitation.  6. There is moderate calcification of the aortic valve. Aortic valve regurgitation is not visualized. Mild aortic valve stenosis.  7. The inferior vena cava is normal in size with greater than 50% respiratory variability, suggesting right atrial pressure of 3 mmHg. FINDINGS  Left Ventricle: Left ventricular ejection fraction, by estimation, is 60 to 65%. The left ventricle has normal function. The left ventricle has no regional wall motion abnormalities. The left ventricular internal cavity size was normal in size. There is  mild left ventricular hypertrophy. Left ventricular diastolic parameters are consistent with Grade I diastolic dysfunction (impaired relaxation). Right Ventricle: The right ventricular size is normal. No increase in right  ventricular wall thickness. Right ventricular systolic function is normal. Tricuspid regurgitation signal is inadequate for assessing PA pressure. Left Atrium: Left atrial size was moderately dilated. Right Atrium: Right atrial size was mildly dilated. Pericardium: There is no evidence of pericardial effusion. Mitral Valve: There is severe calcification of the mitral valve leaflet(s). Trivial mitral valve regurgitation. MV peak gradient, 9.7 mmHg. The mean mitral valve gradient is 4.5 mmHg. Tricuspid Valve: The tricuspid valve is normal in structure. Tricuspid valve regurgitation is not demonstrated. Aortic Valve: There is moderate calcification of the aortic valve. Aortic valve regurgitation is not visualized. Mild aortic stenosis is present. Aortic valve mean gradient measures 11.0 mmHg. Aortic valve peak gradient measures 18.5 mmHg. Aortic valve area, by VTI measures 2.14 cm. Pulmonic Valve: The pulmonic valve was grossly normal. Pulmonic valve regurgitation is not visualized. Aorta: The aortic root and ascending aorta are structurally normal, with no evidence of dilitation. Venous: The inferior vena cava is normal in size with greater than 50% respiratory  variability, suggesting right atrial pressure of 3 mmHg. IAS/Shunts: No atrial level shunt detected by color flow Doppler.  LEFT VENTRICLE PLAX 2D LVIDd:         3.80 cm   Diastology LVIDs:         2.60 cm   LV e' medial:    6.09 cm/s LV PW:         1.10 cm   LV E/e' medial:  20.2 LV IVS:        1.20 cm   LV e' lateral:   6.64 cm/s LVOT diam:     2.10 cm   LV E/e' lateral: 18.5 LV SV:         113 LV SV Index:   66 LVOT Area:     3.46 cm  RIGHT VENTRICLE             IVC RV S prime:     11.00 cm/s  IVC diam: 1.60 cm TAPSE (M-mode): 2.4 cm LEFT ATRIUM             Index        RIGHT ATRIUM           Index LA diam:        4.00 cm 2.34 cm/m   RA Area:     10.70 cm LA Vol (A2C):   84.2 ml 49.21 ml/m  RA Volume:   20.60 ml  12.04 ml/m LA Vol (A4C):   51.9 ml 30.33 ml/m LA Biplane Vol: 72.5 ml 42.37 ml/m  AORTIC VALVE AV Area (Vmax):    1.85 cm AV Area (Vmean):   1.99 cm AV Area (VTI):     2.14 cm AV Vmax:           215.00 cm/s AV Vmean:          160.000 cm/s AV VTI:            0.527 m AV Peak Grad:      18.5 mmHg AV Mean Grad:      11.0 mmHg LVOT Vmax:         115.00 cm/s LVOT Vmean:        91.900 cm/s LVOT VTI:          0.326 m LVOT/AV VTI ratio: 0.62  AORTA Ao Root diam: 3.20 cm Ao Asc diam:  3.30 cm MITRAL VALVE MV Area (PHT): 2.73 cm     SHUNTS MV Area VTI:   2.06 cm  Systemic VTI:  0.33 m MV Peak grad:  9.7 mmHg     Systemic Diam: 2.10 cm MV Mean grad:  4.5 mmHg MV Vmax:       1.56 m/s MV Vmean:      99.9 cm/s MV Decel Time: 278 msec MR Peak grad: 63.5 mmHg MR Vmax:      398.50 cm/s MV E velocity: 123.00 cm/s MV A velocity: 143.00 cm/s MV E/A ratio:  0.86 Landscape architect signed by Phineas Inches Signature Date/Time: 07/22/2022/10:32:06 AM    Final    CT ABDOMEN PELVIS W CONTRAST  Result Date: 07/21/2022 CLINICAL DATA:  Abdominal pain, nausea/vomiting EXAM: CT ABDOMEN AND PELVIS WITH CONTRAST TECHNIQUE: Multidetector CT imaging of the abdomen and pelvis was performed using the standard  protocol following bolus administration of intravenous contrast. RADIATION DOSE REDUCTION: This exam was performed according to the departmental dose-optimization program which includes automated exposure control, adjustment of the mA and/or kV according to patient size and/or use of iterative reconstruction technique. CONTRAST:  55m OMNIPAQUE IOHEXOL 350 MG/ML SOLN COMPARISON:  01/09/2022 and 07/28/2020 FINDINGS: Lower chest: Lung bases are clear. Hepatobiliary: Liver is within normal limits. Gallbladder is unremarkable. No intrahepatic or extrahepatic duct dilatation. Pancreas: Within normal limits. Spleen: Within normal limits. Adrenals/Urinary Tract: Right adrenal gland is within normal limits. At least 2 left adrenal nodules measuring up to 10 mm (series 3/image 22), unchanged from 2021, favoring benign adrenal adenomas. Left renal cortical atrophy. 10 mm right lower pole simple cyst (series 3/image 38), benign (Bosniak I). However, there is a 16 mm hyperdense lesion in the anterior right upper kidney (series 3/image 32), similar to the most recent prior but previously measuring 9 mm in 2021. Differential considerations include benign hemorrhagic cyst versus small papillary renal cell carcinoma. No hydronephrosis. Mildly thick-walled bladder, although underdistended. Stomach/Bowel: Stomach is within normal limits. No evidence of bowel obstruction. Normal appendix (series 3/image 60). Sigmoid diverticulosis with mild pericolonic inflammatory changes in the left lower quadrant, suggesting sigmoid diverticulitis, and a suspected 1.5 x 2.5 cm intramural abscess (series 3/image 61). No free air. Vascular/Lymphatic: No evidence of abdominal aortic aneurysm. Atherosclerotic calcifications of the abdominal aorta and branch vessels. No suspicious abdominopelvic lymphadenopathy. Reproductive: Uterus is within normal limits. Bilateral ovaries are grossly unremarkable. Other: No abdominopelvic ascites. Musculoskeletal:  Degenerative changes of the visualized thoracolumbar spine. IMPRESSION: Acute sigmoid diverticulitis with suspected 2.5 cm intramural abscess. No free air. 16 mm hyperdense lesion in the anterior right upper kidney, mildly increased from 2021. Differential considerations include benign hemorrhagic cyst versus small papillary renal cell carcinoma. Consider follow-up MRI abdomen with/without contrast in 6 months. Additional ancillary findings as above. Electronically Signed   By: SJulian HyM.D.   On: 07/21/2022 23:43   MR BRAIN WO CONTRAST  Result Date: 07/21/2022 CLINICAL DATA:  [place] EXAM: MRI HEAD WITHOUT CONTRAST TECHNIQUE: Multiplanar, multiecho pulse sequences of the brain and surrounding structures were obtained without intravenous contrast. COMPARISON:  None Available. FINDINGS: Brain: No acute infarct, mass effect or extra-axial collection. No acute or chronic hemorrhage. There is multifocal hyperintense T2-weighted signal within the white matter. Parenchymal volume and CSF spaces are normal. The midline structures are normal. Vascular: Major flow voids are preserved. Skull and upper cervical spine: Normal calvarium and skull base. Visualized upper cervical spine and soft tissues are normal. Sinuses/Orbits:No paranasal sinus fluid levels or advanced mucosal thickening. No mastoid or middle ear effusion. Normal orbits. IMPRESSION: 1. No acute intracranial abnormality. 2. Findings of chronic small vessel  ischemia. Electronically Signed   By: Ulyses Jarred M.D.   On: 07/21/2022 22:13   DG Chest Portable 1 View  Result Date: 07/21/2022 CLINICAL DATA:  Hypoxia, hypertension EXAM: PORTABLE CHEST 1 VIEW COMPARISON:  Chest 01/09/2022 FINDINGS: Heart size upper normal. Vascularity normal. Lungs clear without infiltrate or effusion. Bilateral shoulder subluxation appears chronic and unchanged. IMPRESSION: No active disease. Electronically Signed   By: Franchot Gallo M.D.   On: 07/21/2022 19:07       Signature  Lala Lund M.D on 07/25/2022 at 10:31 AM   -  To page go to [email or web address]

## 2022-07-26 ENCOUNTER — Ambulatory Visit: Payer: HMO | Admitting: Physician Assistant

## 2022-07-26 DIAGNOSIS — R6339 Other feeding difficulties: Secondary | ICD-10-CM

## 2022-07-26 DIAGNOSIS — D631 Anemia in chronic kidney disease: Secondary | ICD-10-CM | POA: Diagnosis not present

## 2022-07-26 DIAGNOSIS — E119 Type 2 diabetes mellitus without complications: Secondary | ICD-10-CM | POA: Diagnosis not present

## 2022-07-26 DIAGNOSIS — R112 Nausea with vomiting, unspecified: Secondary | ICD-10-CM

## 2022-07-26 DIAGNOSIS — N186 End stage renal disease: Secondary | ICD-10-CM | POA: Diagnosis not present

## 2022-07-26 DIAGNOSIS — K5792 Diverticulitis of intestine, part unspecified, without perforation or abscess without bleeding: Secondary | ICD-10-CM | POA: Diagnosis not present

## 2022-07-26 LAB — CBC WITH DIFFERENTIAL/PLATELET
Abs Immature Granulocytes: 0.07 10*3/uL (ref 0.00–0.07)
Basophils Absolute: 0 10*3/uL (ref 0.0–0.1)
Basophils Relative: 0 %
Eosinophils Absolute: 0.3 10*3/uL (ref 0.0–0.5)
Eosinophils Relative: 4 %
HCT: 28.8 % — ABNORMAL LOW (ref 36.0–46.0)
Hemoglobin: 9.1 g/dL — ABNORMAL LOW (ref 12.0–15.0)
Immature Granulocytes: 1 %
Lymphocytes Relative: 8 %
Lymphs Abs: 0.6 10*3/uL — ABNORMAL LOW (ref 0.7–4.0)
MCH: 26.7 pg (ref 26.0–34.0)
MCHC: 31.6 g/dL (ref 30.0–36.0)
MCV: 84.5 fL (ref 80.0–100.0)
Monocytes Absolute: 0.5 10*3/uL (ref 0.1–1.0)
Monocytes Relative: 6 %
Neutro Abs: 5.9 10*3/uL (ref 1.7–7.7)
Neutrophils Relative %: 81 %
Platelets: 176 10*3/uL (ref 150–400)
RBC: 3.41 MIL/uL — ABNORMAL LOW (ref 3.87–5.11)
RDW: 21.9 % — ABNORMAL HIGH (ref 11.5–15.5)
WBC: 7.3 10*3/uL (ref 4.0–10.5)
nRBC: 0.3 % — ABNORMAL HIGH (ref 0.0–0.2)

## 2022-07-26 LAB — BASIC METABOLIC PANEL
Anion gap: 11 (ref 5–15)
BUN: 7 mg/dL — ABNORMAL LOW (ref 8–23)
CO2: 29 mmol/L (ref 22–32)
Calcium: 8.1 mg/dL — ABNORMAL LOW (ref 8.9–10.3)
Chloride: 99 mmol/L (ref 98–111)
Creatinine, Ser: 2.38 mg/dL — ABNORMAL HIGH (ref 0.44–1.00)
GFR, Estimated: 21 mL/min — ABNORMAL LOW (ref >60)
Glucose, Bld: 103 mg/dL — ABNORMAL HIGH (ref 70–99)
Potassium: 3.8 mmol/L (ref 3.5–5.1)
Sodium: 139 mmol/L (ref 135–145)

## 2022-07-26 LAB — GLUCOSE, CAPILLARY
Glucose-Capillary: 117 mg/dL — ABNORMAL HIGH (ref 70–99)
Glucose-Capillary: 117 mg/dL — ABNORMAL HIGH (ref 70–99)
Glucose-Capillary: 132 mg/dL — ABNORMAL HIGH (ref 70–99)
Glucose-Capillary: 222 mg/dL — ABNORMAL HIGH (ref 70–99)
Glucose-Capillary: 95 mg/dL (ref 70–99)
Glucose-Capillary: 97 mg/dL (ref 70–99)

## 2022-07-26 LAB — MAGNESIUM: Magnesium: 2 mg/dL (ref 1.7–2.4)

## 2022-07-26 LAB — BRAIN NATRIURETIC PEPTIDE: B Natriuretic Peptide: 389.9 pg/mL — ABNORMAL HIGH (ref 0.0–100.0)

## 2022-07-26 MED ORDER — PROSOURCE TF20 ENFIT COMPATIBL EN LIQD
60.0000 mL | Freq: Every day | ENTERAL | Status: DC
  Administered 2022-07-28: 60 mL
  Filled 2022-07-26 (×2): qty 60

## 2022-07-26 MED ORDER — AMOXICILLIN-POT CLAVULANATE 500-125 MG PO TABS
1.0000 | ORAL_TABLET | Freq: Two times a day (BID) | ORAL | Status: DC
  Administered 2022-07-26 – 2022-07-28 (×5): 1 via ORAL
  Filled 2022-07-26 (×8): qty 1

## 2022-07-26 MED ORDER — ONDANSETRON HCL 4 MG/2ML IJ SOLN
4.0000 mg | Freq: Four times a day (QID) | INTRAMUSCULAR | Status: DC
  Administered 2022-07-27 – 2022-07-28 (×5): 4 mg via INTRAVENOUS
  Filled 2022-07-26 (×4): qty 2

## 2022-07-26 MED ORDER — SODIUM CHLORIDE 0.9 % IV SOLN
12.5000 mg | Freq: Three times a day (TID) | INTRAVENOUS | Status: DC | PRN
  Administered 2022-07-26 – 2022-07-27 (×2): 12.5 mg via INTRAVENOUS
  Filled 2022-07-26: qty 12.5
  Filled 2022-07-26: qty 0.5

## 2022-07-26 NOTE — Progress Notes (Signed)
Mobility Specialist - Progress Note   07/26/22 1612  Mobility  Activity Transferred from chair to bed  Level of Assistance Minimal assist, patient does 75% or more  Assistive Device Front wheel walker  Distance Ambulated (ft) 2 ft  Activity Response Tolerated fair  $Mobility charge 1 Mobility    Pt received in chair requesting transfer to bed. Left in bed w/ MD in room and all needs met.   Paulla Dolly Mobility Specialist

## 2022-07-26 NOTE — Progress Notes (Signed)
[facility] Progress Note   Subjective: Patient seen and examined at bedside.  Reports nausea this AM followed by vomiting after being repositioned in bed. Complains of nasal congestion and post nasal drip making her nausea worse.  States she can not eat or even smell food. Was scheduled to see GI today as outpatient but called and cancelled due to hospitalization.    Objective Vitals:   07/26/22 0132 07/26/22 0144 07/26/22 0345 07/26/22 0751  BP: (!) 132/58  134/66 (!) 145/69  Pulse: 61  63 60  Resp: 15   18  Temp:   98.2 F (36.8 C) 98.8 F (37.1 C)  TempSrc:   Oral Oral  SpO2: 100%  100% 96%  Weight:  68.3 kg    Height:       Physical Exam General:chronically ill appearing female in NAD Heart:RRR, no mrg Lungs:CTAB, nml WOB on RA Abdomen:soft, NTND Extremities:trace LE edema Dialysis Access: RU AVF +b/t   Filed Weights   07/22/22 2040 07/22/22 2242 07/26/22 0144  Weight: 66.6 kg 70.2 kg 68.3 kg    Intake/Output Summary (Last 24 hours) at 07/26/2022 0845 Last data filed at 07/26/2022 0132 Gross per 24 hour  Intake --  Output 400 ml  Net -400 ml    Additional Objective Labs: Basic Metabolic Panel: Recent Labs  Lab 07/22/22 1716 07/23/22 0225 07/24/22 0337 07/25/22 0240 07/26/22 0435  NA  --    < > 135 135 139  K  --    < > 3.0* 3.5 3.8  CL  --    < > 96* 97* 99  CO2  --    < > '28 29 29  '$ GLUCOSE  --    < > 96 99 103*  BUN  --    < > 11 14 7*  CREATININE  --    < > 3.43* 4.11* 2.38*  CALCIUM  --    < > 8.4* 8.3* 8.1*  PHOS 2.4*  --   --   --   --    < > = values in this interval not displayed.   Liver Function Tests: Recent Labs  Lab 07/21/22 1538  AST 9*  ALT <5  ALKPHOS 102  BILITOT 0.5  PROT 5.6*  ALBUMIN 2.9*   CBC: Recent Labs  Lab 07/22/22 0438 07/23/22 0225 07/23/22 0938 07/24/22 0337 07/25/22 0240 07/26/22 0435  WBC 9.2 9.7  --  8.3 8.8 7.3  NEUTROABS  --  8.1*  --  6.5 7.1 5.9  HGB 7.1* 6.8*   < > 8.9* 9.0*  9.1*  HCT 24.0* 22.8*   < > 28.0* 29.1* 28.8*  MCV 87.0 85.7  --  85.4 86.6 84.5  PLT 231 200   < > 184 199 176   < > = values in this interval not displayed.    CBG: Recent Labs  Lab 07/25/22 0814 07/25/22 1156 07/25/22 1645 07/25/22 2043 07/26/22 0345  GLUCAP 112* 121* 125* 136* 95    Medications:   sodium chloride   Intravenous Once   amoxicillin-clavulanate  1 tablet Oral BID   atorvastatin  20 mg Oral Daily   [START ON 07/27/2022] buprenorphine  1 patch Transdermal Weekly   carbidopa-levodopa  1 tablet Oral BID   carbidopa-levodopa  2 tablet Oral q AM   Chlorhexidine Gluconate Cloth  6 each Topical Q0600   Chlorhexidine Gluconate Cloth  6 each Topical Q0600   docusate sodium  200 mg Oral BID   doxercalciferol  2 mcg Intravenous Q M,W,F-HD   feeding supplement (NEPRO CARB STEADY)  237 mL Oral BID BM   fluticasone  1 spray Each Nare BID   gabapentin  300 mg Oral QHS   insulin aspart  0-6 Units Subcutaneous Q4H   magnesium hydroxide  30 mL Oral Once   melatonin  5 mg Oral QHS   midodrine  20 mg Oral Q M,W,F-HD   mirtazapine  15 mg Oral QHS   multivitamin  1 tablet Oral QHS   nystatin  1 Application Topical Q2595   pantoprazole (PROTONIX) IV  40 mg Intravenous Q12H   polyethylene glycol  17 g Oral BID   rOPINIRole  12 mg Oral Daily   tamsulosin  0.4 mg Oral QPC supper    Dialysis Orders: MWF [facility]  3.5h  400/1.5  70kg   2/2 bath  Hep none  RUA AVF - last HD 10/11 post wt 71.1kg - last Hb 7.3 - mircera 225 ug q 2, last 10/9 - calcitriol 0.5 ug po tiw mwf     CXR - no active disease     Problem/Plan: Diverticulitis with small abscess: surgery consulted no need for surgery currently IV antibiotics (Rocephin and metronidazole )and soft diet. Surgery signed off.  ESRD -HD MWF on schedule K 3.8. p.o. supplement per admit team.  HD tomorrow per regular schedule.  If discharged can be completed as outpatient.  HTN/volume: history of chronic hypotension on midodrine  20 mg predialysis .  Net UF 453m yesterday.  Does not appear volume overloaded, continue for UF as tolerated.  Anemia of chronic disease/acute illness:Hgb^9.1, improved from 6.8, status post 1 unit RBC on 10/14, TSAT 38%,  continue high-dose ESA next due 10/23, transfuse  prn Hgb< 7 Secondary hyperparathyroidism - CCa  in range, phosphorus 2.4 holding binder (calcium acetate ). Restart when diet advanced. IDDM -per admit team Nutrition - on soft diet.  Not tolerating this AM. Alb 2.9. Add protein supplement.  Renal diet w/fluid restrictions once advanced.  Hx DVT - anticoagulation on hold.  LJen Mow PA-C [facility] 07/26/2022,8:45 AM  LOS: 4 days

## 2022-07-26 NOTE — Consult Note (Addendum)
[facility] Consult: 4:00 PM 07/26/2022  LOS: 4 days    Referring Provider: Dr. Ronnie Derby. Primary Care Physician: Previously a patient of Bevelyn Buckles, MD but now uses the physician assistant primary care provider at her nursing facility. Primary Gastroenterologist: None.    Reason for Consultation: Vomiting, nausea, anemia   HPI: Jean Mcclain is a 76 y.o. female.   See list of problems below.  She takes Eliquis for history DVT.Marland Kitchen  Has IDDM.  Parkinsons.  ESRD with hemodialysis MWF. No prior upper endoscopy or colonoscopy  For about 2 months patient's been having episodes with nausea and vomiting of mostly clear material that looks like sputum.  Triggers for this include just looking at or smelling food.  She is gotten to the point where she feels sick to her stomach most of the time.  There is no abdominal pain.  She has never vomited blood or coffee-ground material.  Antiemetics are not very effective since the symptoms are so sudden in onset but if she does use them they may or may not be effective.  Her bowels move generally every day though last week she had a bout of constipation that resolved but since then she has had mushy, nonbloody, brown stools.  Because she has not been eating, she is lost weight.  She says that a year ago her weight was around 235 and currently it is about 150 pounds. She takes Protonix 40 mg daily. Patient also describes copious nasal secretions that are also clear and look pretty much the same as the vomit.  She blows her nose constantly and also ends up swallowing a lot of these secretions.   LFTs not elevated.  Albumin low at 2.9. WBCs normal, down from 11.1 5 days ago..   Hgb 7.8... 6.8.  It was 10.56 months ago.  MCV 85, was 96 6 months ago.  Platelets  normal. Ferritin 883.  Normal iron at 50.  Low iron binding capacity.  Elevated iron sats. Low folate at 3.4.  B12 normal. Morning cortisol level normal 12.3 TSH low at 0.2. A1c 6.9.  10//09/2022 CTAP with contrast showed acute sigmoid diverticulitis with a suspected 2.5 cm intramural abscess.  Mild increase in right upper kidney lesion which measures 16 mm.  That differential includes hemorrhagic cyst versus small papillary renal cell carcinoma.  Incidental findings of degenerative thoraco lumbar spine changes.  Liver, gallbladder, pancreas, spleen normal. 07/24/2022 single view abdomen films show nonobstructive bowel gas pattern, no pneumoperitoneum on supine images.  Patient was not aware she had diverticulitis until the CT scan was performed.  She did not have much in the way of abdominal pain.  And no fevers or chills.  Lives at [facility].  Quit smoking in the 1980s.  Does not consume alcoholic beverages. Family history negative for colorectal cancer, ulcer disease, swallowing issues, esophageal cancer.  No history pancreatic or liver problems.  Past Medical History:  Diagnosis Date   Acute and chronic respiratory failure (acute-on-chronic) (HCC)    Acute deep vein thrombosis (DVT) of popliteal vein  of left lower extremity ([place]) 03/11/2017   Anxiety    Arthritis    osteo   Diabetes mellitus, type 2 ([place])    Type II   DVT (deep venous thrombosis) ([place]) 03/2017   left popliteal   ESRD on hemodialysis (HCC)    Facet joint disease    Facet joint disease    foot   Fibula fracture 02/2017   left   GERD (gastroesophageal reflux disease)    H/O blood clots    Hyperlipidemia    Hypertension    Macrocytic anemia    Morbid obesity (HCC)    OSA (obstructive sleep apnea)    does not use CPAP    Osteopenia    Renal disorder    MWF dialysis   Shortness of breath    with exertion   Supplemental oxygen dependent    Venous stasis ulcers ([place])    Vitamin D deficiency      Past Surgical History:  Procedure Laterality Date   ANKLE FUSION Left 06/24/2020   Procedure: LEFT TIBIOCALCANEAL FUSION;  Surgeon: [name], MD;  Location: [facility];  Service: Orthopedics;  Laterality: Left;   AV FISTULA PLACEMENT Right 08/20/2018   Procedure: ARTERIOVENOUS (AV) FISTULA CREATION BRACHIOCEPHALIC;  Surgeon: Angelia Mould, MD;  Location: [facility];  Service: Vascular;  Laterality: Right;   BREAST SURGERY Right 03/1997   biospy   CARPAL TUNNEL RELEASE Left 2016   HYSTEROSCOPY WITH D & C N/A 09/29/2020   Procedure: DILATATION AND CURETTAGE /HYSTEROSCOPY;  Surgeon: Mora Bellman, MD;  Location: [facility];  Service: Gynecology;  Laterality: N/A;   INSERTION OF MESH N/A 01/08/2013   Procedure: INSERTION OF MESH;  Surgeon: Earnstine Regal, MD;  Location: [facility];  Service: General;  Laterality: N/A;   IR THORACENTESIS ASP PLEURAL SPACE W/IMG GUIDE  04/06/2017   JOINT REPLACEMENT Right 1999   TOTAL KNEE ARTHROPLASTY Left 1999   Left   TRIGGER FINGER RELEASE Left 11/2014   TRIGGER FINGER RELEASE Right    VENTRAL HERNIA REPAIR N/A 01/08/2013   Procedure: LAPAROSCOPIC VENTRAL HERNIA;  Surgeon: Earnstine Regal, MD;  Location: [facility];  Service: General;  Laterality: N/A;    Prior to Admission medications   Medication Sig Start Date End Date Taking? Authorizing Provider  acetaminophen (TYLENOL) 500 MG tablet Take 2 tablets (1,000 mg total) by mouth 3 (three) times daily. 01/14/22  Yes Dahal, Marlowe Aschoff, MD  albuterol (VENTOLIN HFA) 108 (90 Base) MCG/ACT inhaler Inhale 2 puffs into the lungs every 6 (six) hours as needed for wheezing or shortness of breath.  03/26/20  Yes [provider]  apixaban (ELIQUIS) 2.5 MG TABS tablet Take 2.5 mg by mouth 2 (two) times daily.   Yes [provider]  atorvastatin (LIPITOR) 20 MG tablet Take 20 mg by mouth daily. 08/24/21  Yes [provider]  buprenorphine (BUTRANS) 7.5 MCG/HR Place 1 patch onto the skin once a week.  Wednesday   Yes [provider]  camphor-menthol Timoteo Ace) lotion Apply 1 Application topically daily as needed for itching. Apply topically to trunk of body, arms legs once daily for itching   Yes [provider]  carbidopa-levodopa (SINEMET IR) 25-100 MG tablet Take 1-2 tablets by mouth See admin instructions. Take 2 tablets by mouth in the morning and 1 tablet twice a day   Yes [provider]  docusate sodium (COLACE) 100 MG capsule Take 200 mg by mouth at bedtime.   Yes [provider]  fluticasone (FLONASE) 50 MCG/ACT nasal spray Place 1 spray into both nostrils 2 (two) times daily.   Yes [provider]  gabapentin (NEURONTIN) 300 MG capsule Take 300 mg by mouth at bedtime.   Yes [provider]  hydrOXYzine (ATARAX) 50 MG tablet Take 50 mg by mouth 2 (two) times daily as needed for anxiety or itching. HOLD WHEN OUT OF COMMUNITY FOR DIALYSIS TX ON Monday, Wednesday & FRIDAY 08/24/21  Yes [provider]  insulin aspart (NOVOLOG) 100 UNIT/ML injection Inject 0-5 Units into the skin at bedtime. Patient taking differently: Inject 3-9 Units into the skin 4 (four) times daily -  before meals and at bedtime. Per sliding scale: 70-100= 3 units, 101-150= 4 units, 151-200= 5 units, 201-250= 6 units, 251-300= 7 units, 310-350= 8 units, 351-400= 9 units, if greater than 400 units call 911 01/14/22  Yes Dahal, Marlowe Aschoff, MD  insulin NPH Human (NOVOLIN N) 100 UNIT/ML injection Inject 0.05 mLs (5 Units total) into the skin 2 (two) times daily at 8 am and 10 pm. 01/14/22  Yes Dahal, Marlowe Aschoff, MD  melatonin 5 MG TABS Take 5 mg by mouth at bedtime.   Yes [provider]  midodrine (PROAMATINE) 10 MG tablet Take 20 mg by mouth See admin instructions. Give 20 mg on Mon, Wed, Fri prior to Dialysis treatment and 1 tablet half way through treatment 08/24/21  Yes [provider]  mirtazapine (REMERON) 15 MG tablet Take 15 mg by mouth at bedtime.   Yes  [provider]  nystatin [facility]) powder Apply 1 Application topically daily at 12 noon. Apply topically to groin and under breast once daily   Yes [provider]  ondansetron (ZOFRAN) 8 MG tablet Take 8 mg by mouth every 8 (eight) hours as needed for nausea or vomiting.   Yes [provider]  OXYGEN Inhale 2 L into the lungs as needed (shortness of breath).   Yes [provider]  pantoprazole (PROTONIX) 40 MG tablet Take 40 mg by mouth daily.   Yes [provider]  polyethylene glycol (MIRALAX / GLYCOLAX) 17 g packet Take 17 g by mouth daily as needed for mild constipation.   Yes [provider]  Ropinirole HCl 12 MG TB24 Take 12 mg by mouth daily.   Yes [provider]  simethicone (MYLICON) 782 MG chewable tablet Chew 125 mg by mouth every 12 (twelve) hours as needed for flatulence.   Yes [provider]  tamsulosin (FLOMAX) 0.4 MG CAPS capsule Take 1 capsule (0.4 mg total) by mouth daily. Patient taking differently: Take 0.4 mg by mouth at bedtime. 01/16/22  Yes Dahal, Marlowe Aschoff, MD  B-D INS SYR ULTRAFINE 1CC/30G 30G X 1/2" 1 ML MISC 1 each by Other route daily.  12/23/19   [provider]  Blood Glucose Monitoring Suppl ([place]) w/Device KIT  03/23/20   [provider]  calcium acetate (PHOSLO) 667 MG tablet Take 667 mg by mouth in the morning, at noon, in the evening, and at bedtime.  11/28/19   [provider]  doxercalciferol (HECTOROL) 4 MCG/2ML injection Inject 1 mL (2 mcg total) into the vein every Monday, Wednesday, and Friday with hemodialysis. 01/27/20   [place] Eric J, DO  glucose blood test strip 1 each by Other route daily.     [provider]  Lancets [facility] DELICA PLUS [id number]) [place] 1 each by Other route daily.  03/05/19   [provider]    Scheduled  Meds:  sodium chloride   Intravenous Once   amoxicillin-clavulanate  1 tablet Oral BID    atorvastatin  20 mg Oral Daily   [START ON 07/27/2022] buprenorphine  1 patch Transdermal Weekly   carbidopa-levodopa  1 tablet Oral BID   carbidopa-levodopa  2 tablet Oral q AM   Chlorhexidine Gluconate Cloth  6 each Topical Q0600   Chlorhexidine Gluconate Cloth  6 each Topical Q0600   docusate sodium  200 mg Oral BID   doxercalciferol  2 mcg Intravenous Q M,W,F-HD   feeding supplement (NEPRO CARB STEADY)  237 mL Oral BID BM   feeding supplement (PROSource TF20)  60 mL Per Tube Daily   fluticasone  1 spray Each Nare BID   gabapentin  300 mg Oral QHS   insulin aspart  0-6 Units Subcutaneous Q4H   magnesium hydroxide  30 mL Oral Once   melatonin  5 mg Oral QHS   midodrine  20 mg Oral Q M,W,F-HD   mirtazapine  15 mg Oral QHS   multivitamin  1 tablet Oral QHS   nystatin  1 Application Topical R4431   pantoprazole (PROTONIX) IV  40 mg Intravenous Q12H   polyethylene glycol  17 g Oral BID   rOPINIRole  12 mg Oral Daily   tamsulosin  0.4 mg Oral QPC supper   Infusions:  promethazine (PHENERGAN) injection (IM or IVPB)     PRN Meds: acetaminophen **OR** acetaminophen, albuterol, camphor-menthol, Glycerin (Adult), hydrOXYzine, midodrine, [DISCONTINUED] ondansetron **OR** ondansetron (ZOFRAN) IV, promethazine (PHENERGAN) injection (IM or IVPB), simethicone   Allergies as of 07/21/2022 - Review Complete 07/21/2022  Allergen Reaction Noted   Morphine Nausea And Vomiting 05/24/2022    Family History  Problem Relation Age of Onset   Leukemia Mother    Hypertension Brother    Emphysema Father     Social History   Socioeconomic History   Marital status: Single    Spouse name: Not on file   Number of children: Not on file   Years of education: Not on file   Highest education level: Not on file  Occupational History   Not on file  Tobacco Use   Smoking status: Former    Years: 27.00    Types: Cigarettes    Quit date: 10/10/1981    Years since quitting: 40.8   Smokeless tobacco:  Never  Vaping Use   Vaping Use: Never used  Substance and Sexual Activity   Alcohol use: No   Drug use: No   Sexual activity: Not on file  Other Topics Concern   Not on file  Social History Narrative   ** Merged History Encounter **       Social Determinants of Health   Financial Resource Strain: Not on file  Food Insecurity: Food Insecurity Present (07/22/2022)   Hunger Vital Sign    Worried About Estate manager/land agent of Food in the Last Year: Often true    Ran Out of Food in the Last Year: Often true  Transportation Needs: No Transportation Needs (07/22/2022)   PRAPARE - Hydrologist (Medical): No    Lack of Transportation (Non-Medical): No  Physical Activity: Not on file  Stress: Not on file  Social Connections: Not on file  Intimate Partner Violence: Not At Risk (07/22/2022)   Humiliation, Afraid, Rape, and Kick questionnaire    Fear of Current or Ex-Partner: No    Emotionally Abused: No    Physically Abused: No    Sexually Abused:  No    REVIEW OF SYSTEMS: Constitutional: Fatigue in general. ENT:  No nose bleeds Pulm: Shortness of breath stable. CV:  No palpitations, no LE edema.  No angina. GU:  No hematuria, no frequency GI: See HPI. Heme: No unusual bleeding or bruising. Transfusions: None. Neuro:  No headaches, no peripheral tingling or numbness.  No syncope, no seizures. Derm:  No itching, no rash or sores.  Endocrine:  No sweats or chills.  No polyuria or dysuria Immunization: Reviewed. Travel: Not queried.   PHYSICAL EXAM: Vital signs in last 24 hours: Vitals:   07/26/22 0751 07/26/22 1443  BP: (!) 145/69 (!) 145/70  Pulse: 60 (!) 59  Resp: 18 18  Temp: 98.8 F (37.1 C) 98.3 F (36.8 C)  SpO2: 96% 96%   Wt Readings from Last 3 Encounters:  07/26/22 68.3 kg  06/21/22 73.9 kg  02/11/22 80.4 kg    General: Patient looks somewhat chronically ill.  She is comfortable and in no distress. Head: No facial asymmetry or  swelling.  No signs of head trauma. Eyes: Conjunctiva pink.  No scleral icterus.  Slight exophthalmos. Ears: Not hard of hearing Nose: No congestion or discharge Mouth: Oral mucosa is moist, pink, clear.  Tongue is midline. Neck: No JVD, no masses, no thyromegaly Lungs: No labored breathing or cough.  Vocal quality a bit hoarse.  Lungs clear bilaterally Heart: RRR.  No MRG.  S1, S2 present Abdomen: Obese, soft.  Slight tenderness without guarding or rebound in the right lower quadrant.  Midline upper ventral hernia moving into the umbilicus present..   Rectal: Deferred Musc/Skeltl: No obvious joint deformities other than some spinal kyphosis. Extremities: No CCE.  Dialysis access catheter in the upper right chest. Neurologic: Oriented x3.  Appropriate.  No tremors.  Moves all 4 limbs, strength not tested. Skin: No telangiectasia.  No rash.  No significant purpura or bruising. Nodes: No cervical adenopathy Psych: Pleasant, cooperative.  Fluid speech.  Intake/Output from previous day: 10/16 0701 - 10/17 0700 In: -  Out: 400  Intake/Output this shift: No intake/output data recorded.  LAB RESULTS: Recent Labs    07/24/22 0337 07/25/22 0240 07/26/22 0435  WBC 8.3 8.8 7.3  HGB 8.9* 9.0* 9.1*  HCT 28.0* 29.1* 28.8*  PLT 184 199 176   BMET Lab Results  Component Value Date   NA 139 07/26/2022   NA 135 07/25/2022   NA 135 07/24/2022   K 3.8 07/26/2022   K 3.5 07/25/2022   K 3.0 (L) 07/24/2022   CL 99 07/26/2022   CL 97 (L) 07/25/2022   CL 96 (L) 07/24/2022   CO2 29 07/26/2022   CO2 29 07/25/2022   CO2 28 07/24/2022   GLUCOSE 103 (H) 07/26/2022   GLUCOSE 99 07/25/2022   GLUCOSE 96 07/24/2022   BUN 7 (L) 07/26/2022   BUN 14 07/25/2022   BUN 11 07/24/2022   CREATININE 2.38 (H) 07/26/2022   CREATININE 4.11 (H) 07/25/2022   CREATININE 3.43 (H) 07/24/2022   CALCIUM 8.1 (L) 07/26/2022   CALCIUM 8.3 (L) 07/25/2022   CALCIUM 8.4 (L) 07/24/2022   LFT No results for  input(s): "PROT", "ALBUMIN", "AST", "ALT", "ALKPHOS", "BILITOT", "BILIDIR", "IBILI" in the last 72 hours. PT/INR Lab Results  Component Value Date   INR 1.3 (H) 07/23/2022   INR 1.5 (H) 01/09/2022   INR 1.65 03/30/2017   Hepatitis Panel No results for input(s): "HEPBSAG", "HCVAB", "HEPAIGM", "HEPBIGM" in the last 72 hours. C-Diff No components found for: "CDIFF"  Lipase     Component Value Date/Time   LIPASE 65 (H) 08/13/2007 1946    Drugs of Abuse  No results found for: "LABOPIA", "COCAINSCRNUR", "LABBENZ", "AMPHETMU", "THCU", "LABBARB"   RADIOLOGY STUDIES: No results found.    IMPRESSION:   Nausea and vomiting.  The triggers are somewhat strange being the smell and visualization of food trigger the symptoms.  Her vomit consists mostly of clear to foamy discharge similar to to her copious nasal discharge.  She is diabetic so she could possibly have gastroparesis.  Because of her symptoms, she is not eating well and has lost weight.  Sigmoid diverticulitis.  This was actually an incidental finding and she completed a course of IV metronidazole, Rocephin and now on Augmentin.  Still has some abdominal tenderness in the right lower quadrant.  Anemia.  Has received 1 PRBC this admission.  IDDM.  ESRD.  Hemodialysis MWF.  Hypotension.  Receives midodrine on dialysis days.  Mild TSH suppression.  Sick euthyroid with stable T3 and free T4.  Kidney lesion, mildly increased since 2021.  Plan 62-monthinterval MRI.  Parkinson's disease.  Continues on Sinemet    PLAN:      Pursue upper endoscopy. Tentatively plan for EGD on Thursday if Dr. SFuller Planapproves.       SAzucena Freed 07/26/2022, 4:00 PM Phone [phone]     Attending Physician Note   I have taken a history, reviewed the chart and examined the patient. I performed a substantive portion of this encounter, including complete performance of at least one of the key components, in conjunction with the APP. I  agree with the APP's note, impression and recommendations with my edits. My additional impressions and recommendations are as follows.   Nausea, vomiting. Food aversion, smell/visualization, and sinus drainage causing N/V. This pattern is not typical for a GI etiology. Brain MRI was negative. Medication side effects, neurologic or ENT etiologies should be further pursued by primary service. Sinemet, Requip and Neurontin could be causing her symptoms. Defer to primary service to consider reducing dosages or discontinuing. Changed Zofran to IV q6h RTC for now. EGD to further evaluate for an unlikely GI cause on Thursday pending Endo unit availability.   Sigmoid colon diverticulitis with a suspected 2.5 cm intramural abscess currently on Augmentin, completing a course of antibiotics.  Anemia, likely chronic disease related   ESRD on HD MWF  MLucio Edward MD [facility] SystemSee AM[facility], [facility], for our on call provider

## 2022-07-26 NOTE — TOC Progression Note (Signed)
Transition of Care [facility]) - Progression Note    Patient Details  Name: Jean Mcclain MRN: [id number] Date of Birth: Jan 29, 1946  Transition of Care [facility]) CM/SW Contact  Reece Agar, [place] Phone Number: 07/26/2022, 2:23 PM  Clinical Narrative:    CSW spoke with Rollene Fare at [facility] who shared that she and/or a nurse at [facility] will come visist pt in room to do an assessment to see if pt is able to return with [facility] and speak with her about going to a SNF since they are aware she has been refusing. CSW will continue to follow for discharge planning, [facility] will follow up with CSW after assessment.    Expected Discharge Plan: [place] Barriers to Discharge: Continued Medical Work up  Expected Discharge Plan and Services Expected Discharge Plan: [place] arrangements for the past 2 months: [place]                                       Social Determinants of Health (SDOH) Interventions    Readmission Risk Interventions    01/25/2020    8:31 AM  Readmission Risk Prevention Plan  Transportation Screening Complete  PCP or Specialist Appt within 5-7 Days Not Complete  Not Complete comments d/c to SNF  Home Care Screening Complete  Medication Review (RN CM) Referral to Pharmacy

## 2022-07-26 NOTE — Progress Notes (Addendum)
Physical Therapy Treatment Patient Details Name: Jean Mcclain MRN: [id number] DOB: [birth year] Today's Date: 07/26/2022   History of Present Illness 76 y/o female presented to ED on 07/21/22 for blurry vision x 2 weeks and hypotension. Found to have diverticulitis with abscess. PMH: ESRD on HD, T2DM, hx of DVT, Parkinson's    PT Comments    Pt progressing towards her physical therapy goals. Reports generalized discomfort in neck, right hip and feet. Pt able to perform bed level exercises to warm up BLE's and then requiring minimal assist for bed mobility and stand pivot transfers; uses RW and LLE AFO. Pt reports goal of returning to ALF with HHPT. ALF would need to be able to provide minimal assist for functional mobility.    Recommendations for follow up therapy are one component of a multi-disciplinary discharge planning process, led by the attending physician.  Recommendations may be updated based on patient status, additional functional criteria and insurance authorization.  Follow Up Recommendations  Skilled nursing-short term rehab (<3 hours/day) (v ALF (if can provide minimal assist for mobility))  Can patient physically be transported by private vehicle: No   Assistance Recommended at Discharge Frequent or constant Supervision/Assistance  Patient can return home with the following A little help with walking and/or transfers;A little help with bathing/dressing/bathroom;Assistance with cooking/housework;Assist for transportation   Equipment Recommendations  None recommended by PT    Recommendations for Other Services       Precautions / Restrictions Precautions Precautions: Fall Required Braces or Orthoses: Other Brace Other Brace: L AFO Restrictions Weight Bearing Restrictions: No     Mobility  Bed Mobility Overal bed mobility: Needs Assistance Bed Mobility: Supine to Sit     Supine to sit: Min assist     General bed mobility comments: Cues for initiation, minA  at trunk to elevate to sitting position    Transfers Overall transfer level: Needs assistance Equipment used: Rolling walker (2 wheels) Transfers: Sit to/from Stand, Bed to chair/wheelchair/BSC Sit to Stand: Min assist Stand pivot transfers: Min assist         General transfer comment: MinA to boost to stand from edge of bed and take pivotal steps from bed to chair. L AFO donned    Ambulation/Gait                   Stairs             Wheelchair Mobility    Modified Rankin (Stroke Patients Only)       Balance Overall balance assessment: Needs assistance Sitting-balance support: No upper extremity supported Sitting balance-Leahy Scale: Fair     Standing balance support: Bilateral upper extremity supported, Reliant on assistive device for balance Standing balance-Leahy Scale: Poor Standing balance comment: reliant on UE support                            Cognition Arousal/Alertness: Awake/alert Behavior During Therapy: WFL for tasks assessed/performed Overall Cognitive Status: No family/caregiver present to determine baseline cognitive functioning                                 General Comments: tangential, needs redirection to task        Exercises General Exercises - Lower Extremity Heel Slides: Both, 5 reps, Supine Hip ABduction/ADduction: Both, 5 reps, Supine    General Comments        Pertinent  Vitals/Pain Pain Assessment Pain Assessment: Faces Faces Pain Scale: Hurts even more Pain Location: neck, R hip, feet Pain Descriptors / Indicators: Discomfort Pain Intervention(s): Limited activity within patient's tolerance, Monitored during session    Home Living                          Prior Function            PT Goals (current goals can now be found in the care plan section) Acute Rehab PT Goals Patient Stated Goal: to be able to get out of bed by herself Potential to Achieve Goals:  Fair Progress towards PT goals: Progressing toward goals    Frequency    Min 2X/week      PT Plan Current plan remains appropriate    Co-evaluation              AM-PAC PT "6 Clicks" Mobility   Outcome Measure  Help needed turning from your back to your side while in a flat bed without using bedrails?: A Little Help needed moving from lying on your back to sitting on the side of a flat bed without using bedrails?: A Little Help needed moving to and from a bed to a chair (including a wheelchair)?: A Little Help needed standing up from a chair using your arms (e.g., wheelchair or bedside chair)?: A Little Help needed to walk in hospital room?: A Lot Help needed climbing 3-5 steps with a railing? : Total 6 Click Score: 15    End of Session   Activity Tolerance: Patient tolerated treatment well Patient left: in chair;with call bell/phone within reach;with chair alarm set Nurse Communication: Mobility status PT Visit Diagnosis: Unsteadiness on feet (R26.81);Muscle weakness (generalized) (M62.81);Difficulty in walking, not elsewhere classified (R26.2)     Time: 6384-6659 PT Time Calculation (min) (ACUTE ONLY): 23 min  Charges:  $Therapeutic Activity: 23-37 mins                     Wyona Almas, PT, DPT Acute Rehabilitation Services Office [phone]    Deno Etienne 07/26/2022, 12:57 PM

## 2022-07-26 NOTE — Progress Notes (Signed)
PROGRESS NOTE                                                                                                                                                                                                             Patient Demographics:    Jean Mcclain, is a 76 y.o. female, DOB - 12/10/1945, ZHY:[id number]  Outpatient Primary MD for the patient is Pcp, No    LOS - 4  Admit date - 07/21/2022    Chief Complaint  Patient presents with   Hypotension   Blurred Vision       Brief Narrative (HPI from H&P)   76 y.o. female with medical history significant of ESRD on HD, full treatment yesterday, DM2, prior DVT on eliquis, Parkinson's, chronic hypotension on midodrine on her dialysis days, who lives at an SNF and has been having blurry vision for the last several months was brought in after she became severely hypotensive with blurry vision, systolic blood pressure was in 70s she was brought to the ER where work-up showed asymptomatic diverticular abscess however she had significant drop in her hemoglobin from a baseline of about 12-8, no known blood in stool or melena, initial Hemoccult negative.   Subjective:   In bed denies any headache, no chest or abdominal pain, constipation is much better, she is persistently nauseated and threw up this morning   Assessment  & Plan :    Symptomatic hypotension and anemia in a patient with incidental finding of diverticular abscess, underlying history of anemia of chronic disease and ESRD. She is surprisingly pain and fever free, diverticular abscess was relatively symptom-free except possibly for intermittent blood loss.  She is s/p 1 unit of packed RBC transfusion on 07/23/2022, posttransfusion H&H is stable.  No signs of ongoing bleeding.  Is been seen by general surgery from a diverticular abscess aspect, okay to transition to oral antibiotics and complete a total of 14-day course, did  switch her to oral antibiotics morning of 07/26/2022 but she is having nausea vomiting, hence switched back to Unasyn and continue IV antibiotics for now.  From the diverticular abscess standpoint she will follow outpatient with general surgery and GI postdischarge and complete total of 14-day antibiotic course, can be transition to oral antibiotics once nausea vomiting is better.   Intermittent nausea vomiting ongoing for several months.  Do  not think this is related to diverticular abscess, she is relatively pain and symptom-free as above from the standpoint.  Have kept her on IV PPI twice daily, bowel regimen to avoid constipation, still getting intermittent episodes of nausea vomiting, she had a pending appointment with [facility] today in the outpatient setting, have requested [facility] to evaluate her while she is in the hospital.  May require EGD.  Acute anemia of unclear etiology in a patient with anemia of chronic disease.  Currently see #1 above.  S/p 1 unit of transfusion on 07/23/2022.  Ongoing blurry vision for several months.  MRI brain negative, probably got worsened due to hypotension, now close to baseline, outpatient ophthalmology follow-up.  ESRD.  On MWF schedule.  Nephrology consulted.  For mild hypokalemia given 20 mEq of potassium on 07/24/2022.  Hypotension.  Scheduled midodrine on the days of dialysis, as needed otherwise.  Stable cortisol, slightly suppressed TSH stable echocardiogram with preserved EF of 60%, no wall motion abnormalities or significant valvular problems.  Mildly suppressed TSH.  Sick euthyroid, stable T3 and free T4.  Pete TSH in 4 to 6 weeks.  Kidney lesion - Mildly increased in size since 2021. Consider follow up MRI abd in 6 months arranged by PCP.  HX of Parkinson's.  Continue home medications.    History of DVT.  Patient had DVT was several years ago and she was started on Eliquis since then, I do not think we need to resume it in the inpatient  setting, will defer this to PCP, question if she requires long-term anticoagulation anyways.   Constipation.  Placed on bowel regimen.  Much improved.  Insulin-requiring or dependent type II diabetes mellitus ([place]) - Very sensitive SSI Q4H for the moment.  Lab Results  Component Value Date   HGBA1C 6.9 (H) 07/22/2022   CBG (last 3)  Recent Labs    07/25/22 1645 07/25/22 2043 07/26/22 0345  GLUCAP 125* 136* 95        Condition - Extremely Guarded  Family Communication  :  None  Code Status :  DNR  Consults  :  CCS, Renal, GI  PUD Prophylaxis : PPI   Procedures  :     Leg [place] - No evidence of deep vein thrombosis seen in the lower extremities, bilaterally. -No evidence of popliteal cyst, bilaterally.    TTE - 1. Left ventricular ejection fraction, by estimation, is 60 to 65%. The left ventricle has normal function. The left ventricle has no regional wall motion abnormalities. There is mild left ventricular hypertrophy. Left ventricular diastolic parameters are consistent with Grade I diastolic dysfunction (impaired relaxation).  2. Right ventricular systolic function is normal. The right ventricular size is normal. Tricuspid regurgitation signal is inadequate for assessing PA pressure.  3. Left atrial size was moderately dilated.  4. Right atrial size was mildly dilated.  5. Trivial mitral valve regurgitation.  6. There is moderate calcification of the aortic valve. Aortic valve regurgitation is not visualized. Mild aortic valve stenosis.  7. The inferior vena cava is normal in size with greater than 50% respiratory variability, suggesting right atrial pressure of 3 mmHg.  MRI - 1. No acute intracranial abnormality. 2. Findings of chronic small vessel ischemia  CT -  Acute sigmoid diverticulitis with suspected 2.5 cm intramural abscess. No free air. 16 mm hyperdense lesion in the anterior right upper kidney, mildly increased from 2021. Differential considerations include benign  hemorrhagic cyst versus small papillary renal cell carcinoma. Consider follow-up  MRI abdomen with/without contrast in 6 months. Additional ancillary findings as above      Disposition Plan  :    Status is: Observation  DVT Prophylaxis  :    Place and maintain sequential compression device Start: 07/22/22 0553   Lab Results  Component Value Date   PLT 176 07/26/2022    Diet :  Diet Order             DIET SOFT Room service appropriate? Yes with Assist; Fluid consistency: Thin  Diet effective now                    Inpatient Medications  Scheduled Meds:  sodium chloride   Intravenous Once   amoxicillin-clavulanate  1 tablet Oral BID   atorvastatin  20 mg Oral Daily   [START ON 07/27/2022] buprenorphine  1 patch Transdermal Weekly   carbidopa-levodopa  1 tablet Oral BID   carbidopa-levodopa  2 tablet Oral q AM   Chlorhexidine Gluconate Cloth  6 each Topical Q0600   Chlorhexidine Gluconate Cloth  6 each Topical Q0600   docusate sodium  200 mg Oral BID   doxercalciferol  2 mcg Intravenous Q M,W,F-HD   feeding supplement (NEPRO CARB STEADY)  237 mL Oral BID BM   fluticasone  1 spray Each Nare BID   gabapentin  300 mg Oral QHS   insulin aspart  0-6 Units Subcutaneous Q4H   magnesium hydroxide  30 mL Oral Once   melatonin  5 mg Oral QHS   midodrine  20 mg Oral Q M,W,F-HD   mirtazapine  15 mg Oral QHS   multivitamin  1 tablet Oral QHS   nystatin  1 Application Topical [id number]   pantoprazole (PROTONIX) IV  40 mg Intravenous Q12H   polyethylene glycol  17 g Oral BID   rOPINIRole  12 mg Oral Daily   tamsulosin  0.4 mg Oral QPC supper   Continuous Infusions:   PRN Meds:.acetaminophen **OR** acetaminophen, albuterol, camphor-menthol, Glycerin (Adult), hydrOXYzine, midodrine, [DISCONTINUED] ondansetron **OR** ondansetron (ZOFRAN) IV, simethicone     Objective:   Vitals:   07/26/22 0132 07/26/22 0144 07/26/22 0345 07/26/22 0751  BP: (!) 132/58  134/66 (!) 145/69   Pulse: 61  63 60  Resp: 15   18  Temp:   98.2 F (36.8 C) 98.8 F (37.1 C)  TempSrc:   Oral Oral  SpO2: 100%  100% 96%  Weight:  68.3 kg    Height:        Wt Readings from Last 3 Encounters:  07/26/22 68.3 kg  06/21/22 73.9 kg  02/11/22 80.4 kg     Intake/Output Summary (Last 24 hours) at 07/26/2022 0811 Last data filed at 07/26/2022 0132 Gross per 24 hour  Intake --  Output 400 ml  Net -400 ml     Physical Exam  Awake Alert, No new F.N deficits, Normal affect [place].AT,PERRAL Supple Neck, No JVD,   Symmetrical Chest wall movement, Good air movement bilaterally, CTAB RRR,No Gallops, Rubs or new Murmurs,  +ve B.Sounds, Abd Soft, No tenderness,   No Cyanosis, Clubbing or edema     Data Review:    CBC Recent Labs  Lab 07/21/22 1538 07/22/22 0438 07/23/22 0225 07/23/22 3500 07/23/22 0950 07/24/22 0337 07/25/22 0240 07/26/22 0435  WBC 11.1* 9.2 9.7  --   --  8.3 8.8 7.3  HGB 7.8* 7.1* 6.8*  --  8.9* 8.9* 9.0* 9.1*  HCT 26.3* 24.0* 22.8*  --  27.8* 28.0*  29.1* 28.8*  PLT 241 231 200 170  --  184 199 176  MCV 87.4 87.0 85.7  --   --  85.4 86.6 84.5  MCH 25.9* 25.7* 25.6*  --   --  27.1 26.8 26.7  MCHC 29.7* 29.6* 29.8*  --   --  31.8 30.9 31.6  RDW 24.0* 24.0* 23.9*  --   --  20.8* 21.4* 21.9*  LYMPHSABS 0.7  --  0.8  --   --  1.0 0.9 0.6*  MONOABS 0.5  --  0.5  --   --  0.5 0.5 0.5  EOSABS 0.1  --  0.2  --   --  0.3 0.2 0.3  BASOSABS 0.0  --  0.0  --   --  0.0 0.0 0.0    Electrolytes Recent Labs  Lab 07/21/22 1538 07/21/22 1838 07/22/22 0438 07/22/22 1716 07/23/22 0225 07/23/22 0938 07/24/22 0337 07/25/22 0240 07/26/22 0435  NA 138  --  138  --  133*  --  135 135 139  K 3.7  --  3.7  --  3.3*  --  3.0* 3.5 3.8  CL 97*  --  101  --  99  --  96* 97* 99  CO2 30  --  30  --  27  --  '28 29 29  '$ GLUCOSE 135*  --  105*  --  121*  --  96 99 103*  BUN 9  --  11  --  6*  --  11 14 7*  CREATININE 3.43*  --  3.71*  --  2.37*  --  3.43* 4.11* 2.38*   CALCIUM 8.4*  --  8.0*  --  7.8*  --  8.4* 8.3* 8.1*  AST 9*  --   --   --   --   --   --   --   --   ALT <5  --   --   --   --   --   --   --   --   ALKPHOS 102  --   --   --   --   --   --   --   --   BILITOT 0.5  --   --   --   --   --   --   --   --   ALBUMIN 2.9*  --   --   --   --   --   --   --   --   MG  --   --   --   --  1.4*  --  2.1 2.3 2.0  DDIMER  --   --   --   --   --  0.59*  --   --   --   INR  --   --   --   --   --  1.3*  --   --   --   TSH  --  0.241*  --  0.212*  --  0.291*  --   --   --   HGBA1C  --   --  6.9*  --   --   --   --   --   --   BNP  --   --   --   --  401.3*  --  515.4* 532.8* 389.9*    ------------------------------------------------------------------------------------------------------------------ No results for input(s): "CHOL", "HDL", "LDLCALC", "TRIG", "CHOLHDL", "LDLDIRECT" in the last 72 hours.  Lab Results  Component Value Date   HGBA1C 6.9 (H) 07/22/2022    Recent Labs    07/23/22 0938  TSH 0.291*       Micro Results No results found for this or any previous visit (from the past 240 hour(s)).  Radiology Reports DG Abd Portable 1V  Result Date: 07/24/2022 CLINICAL DATA:  76 year old female with nausea. Sigmoid diverticulitis with suspected abscess. EXAM: PORTABLE ABDOMEN - 1 VIEW COMPARISON:  Recent CT Abdomen and Pelvis 07/21/2022. FINDINGS: Portable AP supine views at 0820 hours. Negative lung bases. Nonobstructed bowel gas pattern, with a greater paucity of bowel gas compared to 07/21/2022. No pneumoperitoneum is evident on these supine views. Dystrophic pelvic calcifications redemonstrated. Severe right hip joint degeneration. No acute osseous abnormality identified. IMPRESSION: Nonobstructed bowel-gas pattern with no pneumoperitoneum evident on these supine images. Electronically Signed   By: Genevie Ann M.D.   On: 07/24/2022 08:50   VAS [place] LOWER EXTREMITY VENOUS (DVT)  Result Date: 07/22/2022  Lower Venous DVT Study Patient  Name:  CLIFTON KOVACIC  Date of Exam:   07/22/2022 Medical Rec #: [id number]         Accession #:    [id number] Date of Birth: [birth year]          Patient Gender: F Patient Age:   45 years Exam Location:  [facility] Procedure:      VAS [place] LOWER EXTREMITY VENOUS (DVT) Referring Phys: Deno Etienne [facility] --------------------------------------------------------------------------------  Indications: Swelling.  Risk Factors: LLE DVT 2018. Anticoagulation: Eliquis. Limitations: Poor ultrasound/tissue interface. Comparison Study: Previous exam on 10/10/19 was negative for DVT. Performing Technologist: Rogelia Rohrer RVT, RDMS  Examination Guidelines: A complete evaluation includes B-mode imaging, spectral Doppler, color Doppler, and power Doppler as needed of all accessible portions of each vessel. Bilateral testing is considered an integral part of a complete examination. Limited examinations for reoccurring indications may be performed as noted. The reflux portion of the exam is performed with the patient in reverse Trendelenburg.  +---------+---------------+---------+-----------+----------+-------------------+ RIGHT    CompressibilityPhasicitySpontaneityPropertiesThrombus Aging      +---------+---------------+---------+-----------+----------+-------------------+ CFV      Full           Yes      Yes                                      +---------+---------------+---------+-----------+----------+-------------------+ SFJ      Full                                                             +---------+---------------+---------+-----------+----------+-------------------+ FV Prox  Full           Yes      Yes                                      +---------+---------------+---------+-----------+----------+-------------------+ FV Mid   Full           Yes      Yes                                      +---------+---------------+---------+-----------+----------+-------------------+  FV DistalFull            Yes      Yes                                      +---------+---------------+---------+-----------+----------+-------------------+ PFV      Full                                                             +---------+---------------+---------+-----------+----------+-------------------+ POP      Full           Yes      Yes                                      +---------+---------------+---------+-----------+----------+-------------------+ PTV                                                   Not well visualized +---------+---------------+---------+-----------+----------+-------------------+ PERO                                                  Not well visualized +---------+---------------+---------+-----------+----------+-------------------+   +---------+---------------+---------+-----------+----------+-------------------+ LEFT     CompressibilityPhasicitySpontaneityPropertiesThrombus Aging      +---------+---------------+---------+-----------+----------+-------------------+ CFV      Full           Yes      Yes                                      +---------+---------------+---------+-----------+----------+-------------------+ SFJ      Full                                                             +---------+---------------+---------+-----------+----------+-------------------+ FV Prox  Full           Yes      Yes                                      +---------+---------------+---------+-----------+----------+-------------------+ FV Mid   Full           Yes      Yes                                      +---------+---------------+---------+-----------+----------+-------------------+ FV DistalFull           Yes      Yes                                      +---------+---------------+---------+-----------+----------+-------------------+  PFV      Full                                                              +---------+---------------+---------+-----------+----------+-------------------+ POP      Full           Yes      Yes                                      +---------+---------------+---------+-----------+----------+-------------------+ PTV      Full                                         Not well visualized +---------+---------------+---------+-----------+----------+-------------------+ PERO     Full                                         Not well visualized +---------+---------------+---------+-----------+----------+-------------------+     Summary: BILATERAL: - No evidence of deep vein thrombosis seen in the lower extremities, bilaterally. -No evidence of popliteal cyst, bilaterally.   *See table(s) above for measurements and observations. Electronically signed by Monica Martinez MD on 07/22/2022 at 1:40:37 PM.    Final    ECHOCARDIOGRAM COMPLETE  Result Date: 07/22/2022    ECHOCARDIOGRAM REPORT   Patient Name:   ADELEIGH BARLETTA Date of Exam: 07/22/2022 Medical Rec #:  [id number]        Height:       60.0 in Accession #:    [id number]       Weight:       163.0 lb Date of Birth:  14-Oct-1945         BSA:          87.711 m Patient Age:    20 years         BP:           120/49 mmHg Patient Gender: F                HR:           66 bpm. Exam Location:  Inpatient Procedure: 2D Echo, Cardiac Doppler and Color Doppler Indications:    Syncope R55  History:        Patient has prior history of Echocardiogram examinations, most                 recent 03/11/2017. Risk Factors:Hypertension, Sleep Apnea,                 Diabetes and Dyslipidemia.  Sonographer:    Ronny Flurry Referring Phys: [place]  1. Left ventricular ejection fraction, by estimation, is 60 to 65%. The left ventricle has normal function. The left ventricle has no regional wall motion abnormalities. There is mild left ventricular hypertrophy. Left ventricular diastolic parameters are consistent with Grade I  diastolic dysfunction (impaired relaxation).  2. Right ventricular systolic function is normal. The right ventricular size is normal. Tricuspid regurgitation signal is inadequate for assessing PA pressure.  3. Left atrial size was  moderately dilated.  4. Right atrial size was mildly dilated.  5. Trivial mitral valve regurgitation.  6. There is moderate calcification of the aortic valve. Aortic valve regurgitation is not visualized. Mild aortic valve stenosis.  7. The inferior vena cava is normal in size with greater than 50% respiratory variability, suggesting right atrial pressure of 3 mmHg. FINDINGS  Left Ventricle: Left ventricular ejection fraction, by estimation, is 60 to 65%. The left ventricle has normal function. The left ventricle has no regional wall motion abnormalities. The left ventricular internal cavity size was normal in size. There is  mild left ventricular hypertrophy. Left ventricular diastolic parameters are consistent with Grade I diastolic dysfunction (impaired relaxation). Right Ventricle: The right ventricular size is normal. No increase in right ventricular wall thickness. Right ventricular systolic function is normal. Tricuspid regurgitation signal is inadequate for assessing PA pressure. Left Atrium: Left atrial size was moderately dilated. Right Atrium: Right atrial size was mildly dilated. Pericardium: There is no evidence of pericardial effusion. Mitral Valve: There is severe calcification of the mitral valve leaflet(s). Trivial mitral valve regurgitation. MV peak gradient, 9.7 mmHg. The mean mitral valve gradient is 4.5 mmHg. Tricuspid Valve: The tricuspid valve is normal in structure. Tricuspid valve regurgitation is not demonstrated. Aortic Valve: There is moderate calcification of the aortic valve. Aortic valve regurgitation is not visualized. Mild aortic stenosis is present. Aortic valve mean gradient measures 11.0 mmHg. Aortic valve peak gradient measures 18.5 mmHg. Aortic valve  area, by VTI measures 2.14 cm. Pulmonic Valve: The pulmonic valve was grossly normal. Pulmonic valve regurgitation is not visualized. Aorta: The aortic root and ascending aorta are structurally normal, with no evidence of dilitation. Venous: The inferior vena cava is normal in size with greater than 50% respiratory variability, suggesting right atrial pressure of 3 mmHg. IAS/Shunts: No atrial level shunt detected by color flow Doppler.  LEFT VENTRICLE PLAX 2D LVIDd:         3.80 cm   Diastology LVIDs:         2.60 cm   LV e' medial:    6.09 cm/s LV PW:         1.10 cm   LV E/e' medial:  20.2 LV IVS:        1.20 cm   LV e' lateral:   6.64 cm/s LVOT diam:     2.10 cm   LV E/e' lateral: 18.5 LV SV:         113 LV SV Index:   66 LVOT Area:     3.46 cm  RIGHT VENTRICLE             IVC RV S prime:     11.00 cm/s  IVC diam: 1.60 cm TAPSE (M-mode): 2.4 cm LEFT ATRIUM             Index        RIGHT ATRIUM           Index LA diam:        4.00 cm 2.34 cm/m   RA Area:     10.70 cm LA Vol (A2C):   84.2 ml 49.21 ml/m  RA Volume:   20.60 ml  12.04 ml/m LA Vol (A4C):   51.9 ml 30.33 ml/m LA Biplane Vol: 72.5 ml 42.37 ml/m  AORTIC VALVE AV Area (Vmax):    1.85 cm AV Area (Vmean):   1.99 cm AV Area (VTI):     2.14 cm AV Vmax:  215.00 cm/s AV Vmean:          160.000 cm/s AV VTI:            0.527 m AV Peak Grad:      18.5 mmHg AV Mean Grad:      11.0 mmHg LVOT Vmax:         115.00 cm/s LVOT Vmean:        91.900 cm/s LVOT VTI:          0.326 m LVOT/AV VTI ratio: 0.62  AORTA Ao Root diam: 3.20 cm Ao Asc diam:  3.30 cm MITRAL VALVE MV Area (PHT): 2.73 cm     SHUNTS MV Area VTI:   2.06 cm     Systemic VTI:  0.33 m MV Peak grad:  9.7 mmHg     Systemic Diam: 2.10 cm MV Mean grad:  4.5 mmHg MV Vmax:       1.56 m/s MV Vmean:      99.9 cm/s MV Decel Time: 278 msec MR Peak grad: 63.5 mmHg MR Vmax:      398.50 cm/s MV E velocity: 123.00 cm/s MV A velocity: 143.00 cm/s MV E/A ratio:  0.86 Mary Scientist, physiological signed by  Phineas Inches Signature Date/Time: 07/22/2022/10:32:06 AM    Final       Signature  Lala Lund M.D on 07/26/2022 at 8:11 AM   -  To page go to [email or web address]

## 2022-07-27 DIAGNOSIS — R112 Nausea with vomiting, unspecified: Secondary | ICD-10-CM

## 2022-07-27 DIAGNOSIS — R110 Nausea: Secondary | ICD-10-CM

## 2022-07-27 DIAGNOSIS — R6339 Other feeding difficulties: Secondary | ICD-10-CM

## 2022-07-27 DIAGNOSIS — K5792 Diverticulitis of intestine, part unspecified, without perforation or abscess without bleeding: Secondary | ICD-10-CM | POA: Diagnosis not present

## 2022-07-27 LAB — BASIC METABOLIC PANEL
Anion gap: 12 (ref 5–15)
BUN: 14 mg/dL (ref 8–23)
CO2: 23 mmol/L (ref 22–32)
Calcium: 8.2 mg/dL — ABNORMAL LOW (ref 8.9–10.3)
Chloride: 101 mmol/L (ref 98–111)
Creatinine, Ser: 3.18 mg/dL — ABNORMAL HIGH (ref 0.44–1.00)
GFR, Estimated: 15 mL/min — ABNORMAL LOW (ref >60)
Glucose, Bld: 92 mg/dL (ref 70–99)
Potassium: 3.6 mmol/L (ref 3.5–5.1)
Sodium: 136 mmol/L (ref 135–145)

## 2022-07-27 LAB — CBC WITH DIFFERENTIAL/PLATELET
Abs Immature Granulocytes: 0 10*3/uL (ref 0.00–0.07)
Basophils Absolute: 0 10*3/uL (ref 0.0–0.1)
Basophils Relative: 0 %
Eosinophils Absolute: 0.1 10*3/uL (ref 0.0–0.5)
Eosinophils Relative: 1 %
HCT: 26 % — ABNORMAL LOW (ref 36.0–46.0)
Hemoglobin: 8.2 g/dL — ABNORMAL LOW (ref 12.0–15.0)
Lymphocytes Relative: 10 %
Lymphs Abs: 0.5 10*3/uL — ABNORMAL LOW (ref 0.7–4.0)
MCH: 27 pg (ref 26.0–34.0)
MCHC: 31.5 g/dL (ref 30.0–36.0)
MCV: 85.5 fL (ref 80.0–100.0)
Monocytes Absolute: 0.2 10*3/uL (ref 0.1–1.0)
Monocytes Relative: 4 %
Neutro Abs: 4.3 10*3/uL (ref 1.7–7.7)
Neutrophils Relative %: 85 %
Platelets: 139 10*3/uL — ABNORMAL LOW (ref 150–400)
RBC: 3.04 MIL/uL — ABNORMAL LOW (ref 3.87–5.11)
RDW: 21.6 % — ABNORMAL HIGH (ref 11.5–15.5)
WBC: 5.1 10*3/uL (ref 4.0–10.5)
nRBC: 0 % (ref 0.0–0.2)
nRBC: 0 /100 WBC

## 2022-07-27 LAB — GLUCOSE, CAPILLARY
Glucose-Capillary: 124 mg/dL — ABNORMAL HIGH (ref 70–99)
Glucose-Capillary: 95 mg/dL (ref 70–99)
Glucose-Capillary: 88 mg/dL (ref 70–99)
Glucose-Capillary: 197 mg/dL — ABNORMAL HIGH (ref 70–99)

## 2022-07-27 NOTE — Progress Notes (Addendum)
          Daily Rounding Note  07/27/2022, 1:08 PM  LOS: 5 days   SUBJECTIVE:   Chief complaint:    nausea, vomiting, anemia.    No n/v w 2 rice krispy treats yesterday PM and icecream  near midnight.  Ate solid lunch within last hour and so far no issues.  Skipped breakfast "slept in".   Good sized soft BM this AM   OBJECTIVE:         Vital signs in last 24 hours:    Temp:  [97.7 F (36.5 C)-98.3 F (36.8 C)] 97.7 F (36.5 C) (10/18 0801) Pulse Rate:  [51-59] 51 (10/18 0801) Resp:  [16-18] 16 (10/18 0801) BP: (122-152)/(56-70) 131/56 (10/18 0801) SpO2:  [96 %-98 %] 96 % (10/18 0801) Last BM Date : 07/27/22 Filed Weights   07/22/22 2040 07/22/22 2242 07/26/22 0144  Weight: 66.6 kg 70.2 kg 68.3 kg   General: Patient looks chronically unwell.  Comfortable and alert. Heart: RRR. Chest: No labored breathing or cough Abdomen: Soft.  Active bowel sounds.  Obese.  Still lingering tenderness in right mid to lower abdomen but no guarding or rebound. Extremities: No CCE Neuro/Psych: Pleasant, calm, alert.  Fluid speech.  Intake/Output from previous day: 10/17 0701 - 10/18 0700 In: 100 [P.O.:100] Out: -   Intake/Output this shift: No intake/output data recorded.  Lab Results: Recent Labs    07/25/22 0240 07/26/22 0435 07/27/22 0630  WBC 8.8 7.3 5.1  HGB 9.0* 9.1* 8.2*  HCT 29.1* 28.8* 26.0*  PLT 199 176 139*   BMET Recent Labs    07/25/22 0240 07/26/22 0435 07/27/22 0301  NA 135 139 136  K 3.5 3.8 3.6  CL 97* 99 101  CO2 '29 29 23  '$ GLUCOSE 99 103* 92  BUN 14 7* 14  CREATININE 4.11* 2.38* 3.18*  CALCIUM 8.3* 8.1* 8.2*    Studies/Results: No results found.  ASSESMENT:     Atypical N/V, food aversion.  As of last night, sxs much improved on scheduled Zofram    Sigmoid diverticulitis w small abscess.  On Augmentin.      ESRD.  HD on MWF.      Anemia, likely due to CKD.      PLAN     Follow sxs or  lack therof for remainder of day, Dr Fuller Plan will round in a few hours.  If sxs recurrent, can schedule EGD, though may be Friday before slot available.     Jean Mcclain  07/27/2022, 1:08 PM Phone [phone]    Attending Physician Note   I have taken an interval history, reviewed the chart and examined the patient. I performed a substantive portion of this encounter, including complete performance of at least one of the key components, in conjunction with the APP. I agree with the APP's note, impression and recommendations with my edits. My additional impressions and recommendations are as follows.   N/V, food aversion are much improved on scheduled Zofran. Possible antibiotic side effects. Continue scheduled Zofran at least until she has completes antibiotics. Continue pantoprazole 40 mg bid for 2 weeks then reduce to qd. As her symptoms have significantly improved will cancel EGD and will sign off.   Diverticulitis with small abscess. Complete course of antibiotics. Outpatient GI follow up.   Anemia of chronic disease  Jean Edward, MD [facility] See AM[facility], [facility], for our on call provider

## 2022-07-27 NOTE — TOC Progression Note (Signed)
Transition of Care [facility]) - Progression Note    Patient Details  Name: Jean Mcclain MRN: [id number] Date of Birth: 1946/05/29  Transition of Care [facility]) CM/SW Contact  Coralee [place], [place] Phone Number: 07/27/2022, 2:10 PM  Clinical Narrative:    CSW followed up with Jean Mcclain and spoke with DON Jean Mcclain. She states they were not able to get out to do the assessment, but they were updated by the son that pt's level of care is essentially the same. CSW reviewed PT note and DON states they can accept pt back when ready. Jean Mcclain's # is [phone] Fax # [phone]   Expected Discharge Plan: [place] Barriers to Discharge: Continued Medical Work up  Expected Discharge Plan and Services Expected Discharge Plan: [place]       Living arrangements for the past 2 months: [place]                                       Social Determinants of Health (SDOH) Interventions    Readmission Risk Interventions    01/25/2020    8:31 AM  Readmission Risk Prevention Plan  Transportation Screening Complete  PCP or Specialist Appt within 5-7 Days Not Complete  Not Complete comments d/c to SNF  Home Care Screening Complete  Medication Review (RN CM) Referral to Pharmacy

## 2022-07-27 NOTE — Progress Notes (Addendum)
Received patient in bed to unit.  Alert and oriented.  Informed consent signed and in chart.   Treatment initiated: 1500 Treatment completed: 2376  Patient tolerated well. Decreased BFR to 300 d/t elevated venous pressures. Clots noted in dialyzer, dialyzer and system at risk for complete clotting, pt defers staff priming new HD set and prefers ending tx 1 hour early, blood returned to pt and tx ended. Pt signed ama form to end tx early  Transported back to the room  Alert, without acute distress.  Hand-off given to patient's nurse.   Access used: fistula right arm  Access issues: yes. Elevated venous pressures, BFR decreased to 300 d/t elevated venous pressures  Total UF removed: 0.5 liters Medication(s) given: hectorol  Post HD VS: 108/51 HR 68 RR 17 Sat 98% on room air Temp oral 98.9 Post HD weight:    [facility]

## 2022-07-27 NOTE — Plan of Care (Signed)
  Problem: Education: Goal: Ability to describe self-care measures that may prevent or decrease complications (Diabetes Survival Skills Education) will improve Outcome: Progressing   Problem: Coping: Goal: Ability to adjust to condition or change in health will improve Outcome: Progressing   Problem: Education: Goal: Knowledge of General Education information will improve Description: Including pain rating scale, medication(s)/side effects and non-pharmacologic comfort measures Outcome: Progressing   Problem: Health Behavior/Discharge Planning: Goal: Ability to manage health-related needs will improve Outcome: Progressing   

## 2022-07-27 NOTE — Progress Notes (Signed)
PROGRESS NOTE                                                                                                                                                                                                             Patient Demographics:    Jean Mcclain, is a 76 y.o. female, DOB - 1945-10-14, PYK:[id number]  Outpatient Primary MD for the patient is Pcp, No    LOS - 5  Admit date - 07/21/2022    Chief Complaint  Patient presents with   Hypotension   Blurred Vision       Brief Narrative (HPI from H&P)   76 y.o. female with medical history significant of ESRD on HD, full treatment yesterday, DM2, prior DVT on eliquis, Parkinson's, chronic hypotension on midodrine on her dialysis days, who lives at an SNF and has been having blurry vision for the last several months was brought in after she became severely hypotensive with blurry vision, systolic blood pressure was in 70s she was brought to the ER where work-up showed asymptomatic diverticular abscess however she had significant drop in her hemoglobin from a baseline of about 12-8, no known blood in stool or melena, initial Hemoccult negative.   Subjective:   This AM no nausea (also has not eaten)   Assessment  & Plan :    Symptomatic hypotension and anemia in a patient with incidental finding of diverticular abscess, underlying history of anemia of chronic disease and ESRD. She is surprisingly pain and fever free, diverticular abscess was relatively symptom-free except possibly for intermittent blood loss.  She is s/p 1 unit of packed RBC transfusion on 07/23/2022, posttransfusion H&H is stable.  No signs of ongoing bleeding.  Has been seen by general surgery from a diverticular abscess aspect, okay to transition to oral antibiotics and complete a total of 14-day course, did switch her to oral antibiotics morning of 07/26/2022 but she is having nausea vomiting, hence  switched back to Unasyn and continue IV antibiotics for now.  From the diverticular abscess standpoint she will follow outpatient with general surgery and GI postdischarge and complete total of 14-day antibiotic course, can be transition to oral antibiotics once nausea vomiting is better.   Intermittent nausea vomiting ongoing for several months.  Do not think this is related to diverticular abscess, she is relatively pain and symptom-free as  above from the standpoint.  Have kept her on IV PPI twice daily, bowel regimen to avoid constipation, still getting intermittent episodes of nausea vomiting,  -GI consult-- seems improved on schedule zofran -EGD planned for AM   Acute anemia of unclear etiology in a patient with anemia of chronic disease.  Currently see #1 above.  S/p 1 unit of transfusion on 07/23/2022.  Ongoing blurry vision for several months.  MRI brain negative, probably got worsened due to hypotension, now close to baseline, outpatient ophthalmology follow-up.  ESRD.  On MWF schedule.  Nephrology consulted.  For mild hypokalemia given 20 mEq of potassium on 07/24/2022.  Hypotension.  Scheduled midodrine on the days of dialysis, as needed otherwise.  Stable cortisol, slightly suppressed TSH stable echocardiogram with preserved EF of 60%, no wall motion abnormalities or significant valvular problems.  Mildly suppressed TSH.  Sick euthyroid, stable T3 and free T4.  -repeat TSH in 4 to 6 weeks.  Kidney lesion - Mildly increased in size since 2021. Consider follow up MRI abd in 6 months arranged by PCP.  HX of Parkinson's.  Continue home medications.    History of DVT.  Patient had DVT was several years ago and she was started on Eliquis since then, I do not think we need to resume it in the inpatient setting, will defer this to PCP, question if she requires long-term anticoagulation anyways.   Constipation.  Placed on bowel regimen.  Much improved.  Insulin-requiring or dependent  type II diabetes mellitus ([place]) - Very sensitive SSI Q4H for the moment.  Lab Results  Component Value Date   HGBA1C 6.9 (H) 07/22/2022   CBG (last 3)  Recent Labs    07/26/22 2343 07/27/22 0511 07/27/22 0837  GLUCAP 222* 124* 95     Home vs back to ALF-- to be evaluated by ALF       Condition - Extremely Guarded  Family Communication  :  None  Code Status :  DNR  Consults  :  CCS, Renal, GI  PUD Prophylaxis : PPI   Procedures  :     Leg [place] - No evidence of deep vein thrombosis seen in the lower extremities, bilaterally. -No evidence of popliteal cyst, bilaterally.    TTE - 1. Left ventricular ejection fraction, by estimation, is 60 to 65%. The left ventricle has normal function. The left ventricle has no regional wall motion abnormalities. There is mild left ventricular hypertrophy. Left ventricular diastolic parameters are consistent with Grade I diastolic dysfunction (impaired relaxation).  2. Right ventricular systolic function is normal. The right ventricular size is normal. Tricuspid regurgitation signal is inadequate for assessing PA pressure.  3. Left atrial size was moderately dilated.  4. Right atrial size was mildly dilated.  5. Trivial mitral valve regurgitation.  6. There is moderate calcification of the aortic valve. Aortic valve regurgitation is not visualized. Mild aortic valve stenosis.  7. The inferior vena cava is normal in size with greater than 50% respiratory variability, suggesting right atrial pressure of 3 mmHg.  MRI - 1. No acute intracranial abnormality. 2. Findings of chronic small vessel ischemia  CT -  Acute sigmoid diverticulitis with suspected 2.5 cm intramural abscess. No free air. 16 mm hyperdense lesion in the anterior right upper kidney, mildly increased from 2021. Differential considerations include benign hemorrhagic cyst versus small papillary renal cell carcinoma. Consider follow-up MRI abdomen with/without contrast in 6 months.  Additional ancillary findings as above  Lab Results  Component Value Date   PLT 139 (L) 07/27/2022    Diet :  Diet Order             Diet NPO time specified  Diet effective midnight           Diet renal with fluid restriction Fluid restriction: 1200 mL Fluid; Room service appropriate? Yes; Fluid consistency: Thin  Diet effective now                    Inpatient Medications  Scheduled Meds:  amoxicillin-clavulanate  1 tablet Oral BID   atorvastatin  20 mg Oral Daily   buprenorphine  1 patch Transdermal Weekly   carbidopa-levodopa  1 tablet Oral BID   carbidopa-levodopa  2 tablet Oral q AM   Chlorhexidine Gluconate Cloth  6 each Topical Q0600   Chlorhexidine Gluconate Cloth  6 each Topical Q0600   docusate sodium  200 mg Oral BID   doxercalciferol  2 mcg Intravenous Q M,W,F-HD   feeding supplement (NEPRO CARB STEADY)  237 mL Oral BID BM   feeding supplement (PROSource TF20)  60 mL Per Tube Daily   fluticasone  1 spray Each Nare BID   gabapentin  300 mg Oral QHS   insulin aspart  0-6 Units Subcutaneous Q4H   melatonin  5 mg Oral QHS   midodrine  20 mg Oral Q M,W,F-HD   mirtazapine  15 mg Oral QHS   multivitamin  1 tablet Oral QHS   nystatin  1 Application Topical F0263   ondansetron (ZOFRAN) IV  4 mg Intravenous Q6H   pantoprazole (PROTONIX) IV  40 mg Intravenous Q12H   polyethylene glycol  17 g Oral BID   rOPINIRole  12 mg Oral Daily   tamsulosin  0.4 mg Oral QPC supper   Continuous Infusions:  promethazine (PHENERGAN) injection (IM or IVPB) 12.5 mg (07/26/22 2145)    PRN Meds:.acetaminophen **OR** acetaminophen, albuterol, camphor-menthol, Glycerin (Adult), hydrOXYzine, midodrine, promethazine (PHENERGAN) injection (IM or IVPB), simethicone     Objective:   Vitals:   07/26/22 1443 07/26/22 2027 07/27/22 0510 07/27/22 0801  BP: (!) 145/70 (!) 152/61 122/65 (!) 131/56  Pulse: (!) 59 (!) 53 (!) 58 (!) 51  Resp: 18   16  Temp: 98.3 F (36.8 C)  97.7 F (36.5 C) 98 F (36.7 C) 97.7 F (36.5 C)  TempSrc: Oral Oral Oral Oral  SpO2: 96% 98% 96% 96%  Weight:      Height:        Wt Readings from Last 3 Encounters:  07/26/22 68.3 kg  06/21/22 73.9 kg  02/11/22 80.4 kg     Intake/Output Summary (Last 24 hours) at 07/27/2022 1107 Last data filed at 07/26/2022 1600 Gross per 24 hour  Intake 100 ml  Output --  Net 100 ml     Physical Exam  In bed, NAD Rrr Sleepy but will awaken and answer questions   Data Review:    CBC Recent Labs  Lab 07/23/22 0225 07/23/22 0938 07/23/22 0950 07/24/22 0337 07/25/22 0240 07/26/22 0435 07/27/22 0630  WBC 9.7  --   --  8.3 8.8 7.3 5.1  HGB 6.8*  --  8.9* 8.9* 9.0* 9.1* 8.2*  HCT 22.8*  --  27.8* 28.0* 29.1* 28.8* 26.0*  PLT 200 170  --  184 199 176 139*  MCV 85.7  --   --  85.4 86.6 84.5 85.5  MCH 25.6*  --   --  27.1 26.8  26.7 27.0  MCHC 29.8*  --   --  31.8 30.9 31.6 31.5  RDW 23.9*  --   --  20.8* 21.4* 21.9* 21.6*  LYMPHSABS 0.8  --   --  1.0 0.9 0.6* 0.5*  MONOABS 0.5  --   --  0.5 0.5 0.5 0.2  EOSABS 0.2  --   --  0.3 0.2 0.3 0.1  BASOSABS 0.0  --   --  0.0 0.0 0.0 0.0    Electrolytes Recent Labs  Lab 07/21/22 1538 07/21/22 1838 07/22/22 0438 07/22/22 1716 07/23/22 0225 07/23/22 0938 07/24/22 0337 07/25/22 0240 07/26/22 0435 07/27/22 0301  NA 138  --  138  --  133*  --  135 135 139 136  K 3.7  --  3.7  --  3.3*  --  3.0* 3.5 3.8 3.6  CL 97*  --  101  --  99  --  96* 97* 99 101  CO2 30  --  30  --  27  --  '28 29 29 23  '$ GLUCOSE 135*  --  105*  --  121*  --  96 99 103* 92  BUN 9  --  11  --  6*  --  11 14 7* 14  CREATININE 3.43*  --  3.71*  --  2.37*  --  3.43* 4.11* 2.38* 3.18*  CALCIUM 8.4*  --  8.0*  --  7.8*  --  8.4* 8.3* 8.1* 8.2*  AST 9*  --   --   --   --   --   --   --   --   --   ALT <5  --   --   --   --   --   --   --   --   --   ALKPHOS 102  --   --   --   --   --   --   --   --   --   BILITOT 0.5  --   --   --   --   --   --   --   --    --   ALBUMIN 2.9*  --   --   --   --   --   --   --   --   --   MG  --   --   --   --  1.4*  --  2.1 2.3 2.0  --   DDIMER  --   --   --   --   --  0.59*  --   --   --   --   INR  --   --   --   --   --  1.3*  --   --   --   --   TSH  --  0.241*  --  0.212*  --  0.291*  --   --   --   --   HGBA1C  --   --  6.9*  --   --   --   --   --   --   --   BNP  --   --   --   --  401.3*  --  515.4* 532.8* 389.9*  --     ------------------------------------------------------------------------------------------------------------------ No results for input(s): "CHOL", "HDL", "LDLCALC", "TRIG", "CHOLHDL", "LDLDIRECT" in the last 72 hours.  Lab Results  Component Value Date   HGBA1C 6.9 (H) 07/22/2022  No results for input(s): "TSH", "T4TOTAL", "T3FREE", "THYROIDAB" in the last 72 hours.  Invalid input(s): "FREET3"      Micro Results No results found for this or any previous visit (from the past 240 hour(s)).  Radiology Reports DG Abd Portable 1V  Result Date: 07/24/2022 CLINICAL DATA:  76 year old female with nausea. Sigmoid diverticulitis with suspected abscess. EXAM: PORTABLE ABDOMEN - 1 VIEW COMPARISON:  Recent CT Abdomen and Pelvis 07/21/2022. FINDINGS: Portable AP supine views at 0820 hours. Negative lung bases. Nonobstructed bowel gas pattern, with a greater paucity of bowel gas compared to 07/21/2022. No pneumoperitoneum is evident on these supine views. Dystrophic pelvic calcifications redemonstrated. Severe right hip joint degeneration. No acute osseous abnormality identified. IMPRESSION: Nonobstructed bowel-gas pattern with no pneumoperitoneum evident on these supine images. Electronically Signed   By: Genevie Ann M.D.   On: 07/24/2022 08:50      Signature Eulogio Bear DO  on 07/27/2022 at 11:07 AM   -  To page go to [email or web address]

## 2022-07-27 NOTE — Progress Notes (Signed)
Pt receives out-pt HD at [facility] on MWF. Will assist as needed.   Melven Sartorius Renal Navigator [phone]

## 2022-07-27 NOTE — Progress Notes (Signed)
[facility] Progress Note   Subjective:   Patient seen and examined at bedside.  Reports pain in neck from arthritis.  Denies CP, SOB, orthopnea and abdominal pain.   Objective Vitals:   07/26/22 1443 07/26/22 2027 07/27/22 0510 07/27/22 0801  BP: (!) 145/70 (!) 152/61 122/65 (!) 131/56  Pulse: (!) 59 (!) 53 (!) 58 (!) 51  Resp: 18   16  Temp: 98.3 F (36.8 C) 97.7 F (36.5 C) 98 F (36.7 C) 97.7 F (36.5 C)  TempSrc: Oral Oral Oral Oral  SpO2: 96% 98% 96% 96%  Weight:      Height:       Physical Exam General:chronically ill appearing female in NAD Heart:RRR, no mrg Lungs:CTAB anteriorly, nml WOB on RA Abdomen:soft, NTND Extremities:no LE edema Dialysis Access: RU AVF +b/t  Filed Weights   07/22/22 2040 07/22/22 2242 07/26/22 0144  Weight: 66.6 kg 70.2 kg 68.3 kg    Intake/Output Summary (Last 24 hours) at 07/27/2022 1009 Last data filed at 07/26/2022 1600 Gross per 24 hour  Intake 100 ml  Output --  Net 100 ml    Additional Objective Labs: Basic Metabolic Panel: Recent Labs  Lab 07/22/22 1716 07/23/22 0225 07/25/22 0240 07/26/22 0435 07/27/22 0301  NA  --    < > 135 139 136  K  --    < > 3.5 3.8 3.6  CL  --    < > 97* 99 101  CO2  --    < > '29 29 23  '$ GLUCOSE  --    < > 99 103* 92  BUN  --    < > 14 7* 14  CREATININE  --    < > 4.11* 2.38* 3.18*  CALCIUM  --    < > 8.3* 8.1* 8.2*  PHOS 2.4*  --   --   --   --    < > = values in this interval not displayed.   Liver Function Tests: Recent Labs  Lab 07/21/22 1538  AST 9*  ALT <5  ALKPHOS 102  BILITOT 0.5  PROT 5.6*  ALBUMIN 2.9*    CBC: Recent Labs  Lab 07/23/22 0225 07/23/22 0938 07/24/22 0337 07/25/22 0240 07/26/22 0435 07/27/22 0630  WBC 9.7  --  8.3 8.8 7.3 5.1  NEUTROABS 8.1*  --  6.5 7.1 5.9 4.3  HGB 6.8*   < > 8.9* 9.0* 9.1* 8.2*  HCT 22.8*   < > 28.0* 29.1* 28.8* 26.0*  MCV 85.7  --  85.4 86.6 84.5 85.5  PLT 200   < > 184 199 176 139*   < > = values in  this interval not displayed.    CBG: Recent Labs  Lab 07/26/22 1559 07/26/22 2026 07/26/22 2343 07/27/22 0511 07/27/22 0837  GLUCAP 132* 117* 222* 124* 95   Medications:  promethazine (PHENERGAN) injection (IM or IVPB) 12.5 mg (07/26/22 2145)    sodium chloride   Intravenous Once   amoxicillin-clavulanate  1 tablet Oral BID   atorvastatin  20 mg Oral Daily   buprenorphine  1 patch Transdermal Weekly   carbidopa-levodopa  1 tablet Oral BID   carbidopa-levodopa  2 tablet Oral q AM   Chlorhexidine Gluconate Cloth  6 each Topical Q0600   Chlorhexidine Gluconate Cloth  6 each Topical Q0600   docusate sodium  200 mg Oral BID   doxercalciferol  2 mcg Intravenous Q M,W,F-HD   feeding supplement (NEPRO CARB STEADY)  237 mL Oral BID  BM   feeding supplement (PROSource TF20)  60 mL Per Tube Daily   fluticasone  1 spray Each Nare BID   gabapentin  300 mg Oral QHS   insulin aspart  0-6 Units Subcutaneous Q4H   magnesium hydroxide  30 mL Oral Once   melatonin  5 mg Oral QHS   midodrine  20 mg Oral Q M,W,F-HD   mirtazapine  15 mg Oral QHS   multivitamin  1 tablet Oral QHS   nystatin  1 Application Topical [id number]   ondansetron (ZOFRAN) IV  4 mg Intravenous Q6H   pantoprazole (PROTONIX) IV  40 mg Intravenous Q12H   polyethylene glycol  17 g Oral BID   rOPINIRole  12 mg Oral Daily   tamsulosin  0.4 mg Oral QPC supper    Dialysis Orders: MWF [facility]  3.5h  400/1.5  70kg   2/2 bath  Hep none  RUA AVF - last HD 10/11 post wt 71.1kg - last Hb 7.3 - mircera 225 ug q 2, last 10/9 - calcitriol 0.5 ug po tiw mwf     CXR - no active disease     Problem/Plan: Diverticulitis with small abscess: surgery consulted no need for surgery currently IV antibiotics (Rocephin and metronidazole )and soft diet. Surgery signed off.  ESRD -HD MWF. K 3.6.  HD today per regular schedule.  N/V - GI consulted.  Plan for EGD tomorrow.  HTN/volume: history of chronic hypotension on midodrine 20 mg predialysis.   Does not appear volume overloaded, continue for UF as tolerated.  Anemia of chronic disease/acute illness:Hgb 8.2, s/p 1 unit RBC on 10/14, TSAT 38%,  continue high-dose ESA next due 10/23, transfuse  prn Hgb< 7 Secondary hyperparathyroidism - CCa  in range, phosphorus 2.4 holding binder (calcium acetate ). Restart when diet advanced. IDDM -per admit team Nutrition - on soft diet.  Alb 2.9. Add protein supplement.  Renal diet w/fluid restrictions once advanced.  Hx DVT - anticoagulation on hold.  Jen Mow, PA-C [facility] 07/27/2022,10:09 AM  LOS: 5 days

## 2022-07-28 DIAGNOSIS — R55 Syncope and collapse: Secondary | ICD-10-CM

## 2022-07-28 DIAGNOSIS — K5792 Diverticulitis of intestine, part unspecified, without perforation or abscess without bleeding: Secondary | ICD-10-CM | POA: Diagnosis not present

## 2022-07-28 LAB — GLUCOSE, CAPILLARY
Glucose-Capillary: 121 mg/dL — ABNORMAL HIGH (ref 70–99)
Glucose-Capillary: 129 mg/dL — ABNORMAL HIGH (ref 70–99)
Glucose-Capillary: 149 mg/dL — ABNORMAL HIGH (ref 70–99)
Glucose-Capillary: 162 mg/dL — ABNORMAL HIGH (ref 70–99)
Glucose-Capillary: 245 mg/dL — ABNORMAL HIGH (ref 70–99)

## 2022-07-28 SURGERY — ESOPHAGOGASTRODUODENOSCOPY (EGD) WITH PROPOFOL
Anesthesia: Monitor Anesthesia Care

## 2022-07-28 MED ORDER — ONDANSETRON 8 MG PO TBDP: 8.0000 mg | ORAL_TABLET | Freq: Four times a day (QID) | ORAL | 0 refills | Status: DC

## 2022-07-28 MED ORDER — AMOXICILLIN-POT CLAVULANATE 500-125 MG PO TABS: 1.0000 | ORAL_TABLET | Freq: Two times a day (BID) | ORAL | 0 refills | Status: DC

## 2022-07-28 MED ORDER — PANTOPRAZOLE SODIUM 40 MG PO TBEC: 40.0000 mg | DELAYED_RELEASE_TABLET | Freq: Two times a day (BID) | ORAL | 0 refills | Status: DC

## 2022-07-28 MED ORDER — ONDANSETRON 4 MG PO TBDP
4.0000 mg | ORAL_TABLET | Freq: Four times a day (QID) | ORAL | Status: DC
  Administered 2022-07-28 (×2): 4 mg via ORAL
  Filled 2022-07-28 (×2): qty 1

## 2022-07-28 MED ORDER — GLYCERIN (LAXATIVE) 2 G RE SUPP: 1.0000 | Freq: Every day | RECTAL | 0 refills | Status: DC | PRN

## 2022-07-28 MED ORDER — PROMETHAZINE HCL 12.5 MG RE SUPP: 12.5000 mg | Freq: Four times a day (QID) | RECTAL | 0 refills | Status: DC | PRN

## 2022-07-28 MED ORDER — SODIUM CHLORIDE 0.9 % IV SOLN: 12.5000 mg | Freq: Three times a day (TID) | INTRAVENOUS | Status: DC | PRN

## 2022-07-28 MED ORDER — PHENOL 1.4 % MT LIQD: 1.0000 | OROMUCOSAL | Status: DC | PRN

## 2022-07-28 NOTE — Discharge Summary (Signed)
Physician Discharge Summary  Jean Mcclain ZOX:[id number] DOB: [birth year] DOA: 07/21/2022  PCP: Jean Mcclain  Admit date: 07/21/2022 Discharge date: 07/28/2022  Admitted From: alf Discharge disposition: alf   Recommendations for Outpatient Follow-Up:   Eliquis stopped upon admission-- defer to PCP to resume if needed Cbc, bmp 1 week TSH 4-6 weeks Possible antibiotic side effects. Continue scheduled Zofran at least until she has completes antibiotics. Continue pantoprazole 40 mg bid for 2 weeks then reduce to qd.    Discharge Diagnosis:   Principal Problem:   Diverticulitis of intestine with abscess without bleeding Active Problems:   Blurry vision, bilateral   ESRD on hemodialysis ([place])   Anemia in chronic kidney disease   Insulin-requiring or dependent type II diabetes mellitus ([place])   Kidney lesion   Nausea and vomiting   Food aversion    Discharge Condition: Improved.  Diet recommendation: renal  Wound care: None.  Code status: Full.   History of Present Illness:   Jean Mcclain is a 76 y.o. female with medical history significant of ESRD on HD, full treatment yesterday, DM2, prior DVT on eliquis.  Some Parkinson's dz on sinemet.  Low BPs, looks like shes on midodrine.   Pt in to ED with progressively worsening blurry vision over past couple of months.   More recently having hypotension post dialysis with intermittent dizziness, nausea, vomiting, and abd pain.   Yesterday after  dialysis had syncope episode, EMS called, BP 72/62, and pt brought in to ED.   BP improved with IVF.   Patient however is less concerned about the lightheadedness and syncope as she says she had lightheadedness in the setting of dialysis in the past with low blood pressures but she is more concerned about her symptoms have been progressing for the last few months and even the last few weeks worsening.  She says she has had some blurry vision bilaterally and now is having  diplopia.  She said that she also having some hearing loss that feels like it is worse in the left ear and is having intermittent room spinning dizziness.  She is never had this before.  She says that she is also had some intermittent nausea and vomiting and has had some pain in her right abdomen and flank that feels new but she cannot say when it started.  She reports she is not having anterior abdominal pain where she has had her ventral hernia surgery before.  She reports Mcclain constipation or diarrhea at this time.  She denies any urinary changes does report she makes some urine.  Patient has had CT scans with contrast in the past and agrees to get contrast if she needs it.  Patient previous CT did show some abnormality of the kidney that was concerning for possible neoplasm versus atypical cyst with hemorrhage.   Hospital Course by Problem:   Symptomatic hypotension and anemia in a patient with incidental finding of diverticular abscess, underlying history of anemia of chronic disease and ESRD. She is surprisingly pain and fever free, diverticular abscess was relatively symptom-free except possibly for intermittent blood loss.  She is s/p 1 unit of packed RBC transfusion on 07/23/2022, posttransfusion H&H is stable.  Mcclain signs of ongoing bleeding.  .  From the diverticular abscess standpoint she will follow outpatient with general surgery and GI postdischarge and complete total of 14-day antibiotic course   Intermittent nausea vomiting ongoing for several months.  Do not think this is related to  diverticular abscess, she is relatively pain and symptom-free as above from the standpoint.  Have kept her on IV PPI twice daily, bowel regimen to avoid constipation, still getting intermittent episodes of nausea vomiting,  -GI consult-- seems improved on schedule zofran -EGD planned for AM     Acute anemia of unclear etiology in a patient with anemia of chronic disease.  Currently see #1 above.  S/p 1 unit of  transfusion on 07/23/2022.   Ongoing blurry vision for several months.  MRI brain negative, probably got worsened due to hypotension, now close to baseline, outpatient ophthalmology follow-up.   ESRD.  On MWF schedule.  Nephrology consulted.  For mild hypokalemia given 20 mEq of potassium on 07/24/2022.   Hypotension.  Scheduled midodrine on the days of dialysis, as needed otherwise.  Stable cortisol, slightly suppressed TSH stable echocardiogram with preserved EF of 60%, Mcclain wall motion abnormalities or significant valvular problems.   Mildly suppressed TSH.  Sick euthyroid, stable T3 and free T4.  -repeat TSH in 4 to 6 weeks.   Kidney lesion - Mildly increased in size since 2021. Consider follow up MRI abd in 6 months arranged by PCP.   HX of Parkinson's.  Continue home medications.     History of DVT.  Patient had DVT was several years ago and she was started on Eliquis since then, I do not think we need to resume it in the inpatient setting, will defer this to PCP, question if she requires long-term anticoagulation anyways.     Constipation.  Placed on bowel regimen.  Much improved.    Home health  Medical Consultants:   [facility]   Discharge Exam:   Vitals:   07/28/22 0557 07/28/22 0745  BP: (!) 134/56 (!) 128/52  Pulse: 67 65  Resp:  16  Temp: 98.3 F (36.8 C) 97.7 F (36.5 C)  SpO2: 97% 100%   Vitals:   07/27/22 1730 07/27/22 1901 07/28/22 0557 07/28/22 0745  BP: 102/66 (!) 112/53 (!) 134/56 (!) 128/52  Pulse: (!) 54 74 67 65  Resp: (!) '25 16  16  ' Temp:  98 F (36.7 C) 98.3 F (36.8 C) 97.7 F (36.5 C)  TempSrc:  Oral Oral Oral  SpO2:  99% 97% 100%  Weight:      Height:        General exam: Appears calm and comfortable.    The results of significant diagnostics from this hospitalization (including imaging, microbiology, ancillary and laboratory) are listed below for reference.     Procedures and Diagnostic Studies:   VAS [place] LOWER EXTREMITY  VENOUS (DVT)  Result Date: 07/22/2022  Lower Venous DVT Study Patient Name:  Jean Mcclain  Date of Exam:   07/22/2022 Medical Rec #: [id number]         Accession #:    [id number] Date of Birth: 25-Oct-1945          Patient Gender: F Patient Age:   49 years Exam Location:  [facility] Procedure:      VAS [place] LOWER EXTREMITY VENOUS (DVT) Referring Phys: Deno Etienne [facility] --------------------------------------------------------------------------------  Indications: Swelling.  Risk Factors: LLE DVT 2018. Anticoagulation: Eliquis. Limitations: Poor ultrasound/tissue interface. Comparison Study: Previous exam on 10/10/19 was negative for DVT. Performing Technologist: Rogelia Rohrer RVT, RDMS  Examination Guidelines: A complete evaluation includes B-mode imaging, spectral Doppler, color Doppler, and power Doppler as needed of all accessible portions of each vessel. Bilateral testing is considered an integral part of a complete examination.  Limited examinations for reoccurring indications may be performed as noted. The reflux portion of the exam is performed with the patient in reverse Trendelenburg.  +---------+---------------+---------+-----------+----------+-------------------+ RIGHT    CompressibilityPhasicitySpontaneityPropertiesThrombus Aging      +---------+---------------+---------+-----------+----------+-------------------+ CFV      Full           Yes      Yes                                      +---------+---------------+---------+-----------+----------+-------------------+ SFJ      Full                                                             +---------+---------------+---------+-----------+----------+-------------------+ FV Prox  Full           Yes      Yes                                      +---------+---------------+---------+-----------+----------+-------------------+ FV Mid   Full           Yes      Yes                                       +---------+---------------+---------+-----------+----------+-------------------+ FV DistalFull           Yes      Yes                                      +---------+---------------+---------+-----------+----------+-------------------+ PFV      Full                                                             +---------+---------------+---------+-----------+----------+-------------------+ POP      Full           Yes      Yes                                      +---------+---------------+---------+-----------+----------+-------------------+ PTV                                                   Not well visualized +---------+---------------+---------+-----------+----------+-------------------+ PERO                                                  Not well visualized +---------+---------------+---------+-----------+----------+-------------------+   +---------+---------------+---------+-----------+----------+-------------------+ LEFT     CompressibilityPhasicitySpontaneityPropertiesThrombus Aging      +---------+---------------+---------+-----------+----------+-------------------+  CFV      Full           Yes      Yes                                      +---------+---------------+---------+-----------+----------+-------------------+ SFJ      Full                                                             +---------+---------------+---------+-----------+----------+-------------------+ FV Prox  Full           Yes      Yes                                      +---------+---------------+---------+-----------+----------+-------------------+ FV Mid   Full           Yes      Yes                                      +---------+---------------+---------+-----------+----------+-------------------+ FV DistalFull           Yes      Yes                                      +---------+---------------+---------+-----------+----------+-------------------+  PFV      Full                                                             +---------+---------------+---------+-----------+----------+-------------------+ POP      Full           Yes      Yes                                      +---------+---------------+---------+-----------+----------+-------------------+ PTV      Full                                         Not well visualized +---------+---------------+---------+-----------+----------+-------------------+ PERO     Full                                         Not well visualized +---------+---------------+---------+-----------+----------+-------------------+     Summary: BILATERAL: - Mcclain evidence of deep vein thrombosis seen in the lower extremities, bilaterally. -Mcclain evidence of popliteal cyst, bilaterally.   *See table(s) above for measurements and observations. Electronically signed by Monica Martinez MD on 07/22/2022 at 1:40:37 PM.    Final    ECHOCARDIOGRAM COMPLETE  Result Date: 07/22/2022    ECHOCARDIOGRAM  REPORT   Patient Name:   Jean Mcclain Date of Exam: 07/22/2022 Medical Rec #:  [id number]        Height:       60.0 in Accession #:    [id number]       Weight:       163.0 lb Date of Birth:  04-07-1946         BSA:          5.711 m Patient Age:    46 years         BP:           120/49 mmHg Patient Gender: F                HR:           66 bpm. Exam Location:  Inpatient Procedure: 2D Echo, Cardiac Doppler and Color Doppler Indications:    Syncope R55  History:        Patient has prior history of Echocardiogram examinations, most                 recent 03/11/2017. Risk Factors:Hypertension, Sleep Apnea,                 Diabetes and Dyslipidemia.  Sonographer:    Ronny Flurry Referring Phys: [place]  1. Left ventricular ejection fraction, by estimation, is 60 to 65%. The left ventricle has normal function. The left ventricle has Mcclain regional wall motion abnormalities. There is mild left  ventricular hypertrophy. Left ventricular diastolic parameters are consistent with Grade I diastolic dysfunction (impaired relaxation).  2. Right ventricular systolic function is normal. The right ventricular size is normal. Tricuspid regurgitation signal is inadequate for assessing PA pressure.  3. Left atrial size was moderately dilated.  4. Right atrial size was mildly dilated.  5. Trivial mitral valve regurgitation.  6. There is moderate calcification of the aortic valve. Aortic valve regurgitation is not visualized. Mild aortic valve stenosis.  7. The inferior vena cava is normal in size with greater than 50% respiratory variability, suggesting right atrial pressure of 3 mmHg. FINDINGS  Left Ventricle: Left ventricular ejection fraction, by estimation, is 60 to 65%. The left ventricle has normal function. The left ventricle has Mcclain regional wall motion abnormalities. The left ventricular internal cavity size was normal in size. There is  mild left ventricular hypertrophy. Left ventricular diastolic parameters are consistent with Grade I diastolic dysfunction (impaired relaxation). Right Ventricle: The right ventricular size is normal. Mcclain increase in right ventricular wall thickness. Right ventricular systolic function is normal. Tricuspid regurgitation signal is inadequate for assessing PA pressure. Left Atrium: Left atrial size was moderately dilated. Right Atrium: Right atrial size was mildly dilated. Pericardium: There is Mcclain evidence of pericardial effusion. Mitral Valve: There is severe calcification of the mitral valve leaflet(s). Trivial mitral valve regurgitation. MV peak gradient, 9.7 mmHg. The mean mitral valve gradient is 4.5 mmHg. Tricuspid Valve: The tricuspid valve is normal in structure. Tricuspid valve regurgitation is not demonstrated. Aortic Valve: There is moderate calcification of the aortic valve. Aortic valve regurgitation is not visualized. Mild aortic stenosis is present. Aortic valve mean  gradient measures 11.0 mmHg. Aortic valve peak gradient measures 18.5 mmHg. Aortic valve area, by VTI measures 2.14 cm. Pulmonic Valve: The pulmonic valve was grossly normal. Pulmonic valve regurgitation is not visualized. Aorta: The aortic root and ascending aorta are structurally normal, with Mcclain evidence of dilitation. Venous: The inferior vena cava is  normal in size with greater than 50% respiratory variability, suggesting right atrial pressure of 3 mmHg. IAS/Shunts: Mcclain atrial level shunt detected by color flow Doppler.  LEFT VENTRICLE PLAX 2D LVIDd:         3.80 cm   Diastology LVIDs:         2.60 cm   LV e' medial:    6.09 cm/s LV PW:         1.10 cm   LV E/e' medial:  20.2 LV IVS:        1.20 cm   LV e' lateral:   6.64 cm/s LVOT diam:     2.10 cm   LV E/e' lateral: 18.5 LV SV:         113 LV SV Index:   66 LVOT Area:     3.46 cm  RIGHT VENTRICLE             IVC RV S prime:     11.00 cm/s  IVC diam: 1.60 cm TAPSE (M-mode): 2.4 cm LEFT ATRIUM             Index        RIGHT ATRIUM           Index LA diam:        4.00 cm 2.34 cm/m   RA Area:     10.70 cm LA Vol (A2C):   84.2 ml 49.21 ml/m  RA Volume:   20.60 ml  12.04 ml/m LA Vol (A4C):   51.9 ml 30.33 ml/m LA Biplane Vol: 72.5 ml 42.37 ml/m  AORTIC VALVE AV Area (Vmax):    1.85 cm AV Area (Vmean):   1.99 cm AV Area (VTI):     2.14 cm AV Vmax:           215.00 cm/s AV Vmean:          160.000 cm/s AV VTI:            0.527 m AV Peak Grad:      18.5 mmHg AV Mean Grad:      11.0 mmHg LVOT Vmax:         115.00 cm/s LVOT Vmean:        91.900 cm/s LVOT VTI:          0.326 m LVOT/AV VTI ratio: 0.62  AORTA Ao Root diam: 3.20 cm Ao Asc diam:  3.30 cm MITRAL VALVE MV Area (PHT): 2.73 cm     SHUNTS MV Area VTI:   2.06 cm     Systemic VTI:  0.33 m MV Peak grad:  9.7 mmHg     Systemic Diam: 2.10 cm MV Mean grad:  4.5 mmHg MV Vmax:       1.56 m/s MV Vmean:      99.9 cm/s MV Decel Time: 278 msec MR Peak grad: 63.5 mmHg MR Vmax:      398.50 cm/s MV E velocity: 123.00  cm/s MV A velocity: 143.00 cm/s MV E/A ratio:  0.86 Landscape architect signed by Phineas Inches Signature Date/Time: 07/22/2022/10:32:06 AM    Final    CT ABDOMEN PELVIS W CONTRAST  Result Date: 07/21/2022 CLINICAL DATA:  Abdominal pain, nausea/vomiting EXAM: CT ABDOMEN AND PELVIS WITH CONTRAST TECHNIQUE: Multidetector CT imaging of the abdomen and pelvis was performed using the standard protocol following bolus administration of intravenous contrast. RADIATION DOSE REDUCTION: This exam was performed according to the departmental dose-optimization program which includes automated exposure control, adjustment of the mA and/or kV  according to patient size and/or use of iterative reconstruction technique. CONTRAST:  20m OMNIPAQUE IOHEXOL 350 MG/ML SOLN COMPARISON:  01/09/2022 and 07/28/2020 FINDINGS: Lower chest: Lung bases are clear. Hepatobiliary: Liver is within normal limits. Gallbladder is unremarkable. Mcclain intrahepatic or extrahepatic duct dilatation. Pancreas: Within normal limits. Spleen: Within normal limits. Adrenals/Urinary Tract: Right adrenal gland is within normal limits. At least 2 left adrenal nodules measuring up to 10 mm (series 3/image 22), unchanged from 2021, favoring benign adrenal adenomas. Left renal cortical atrophy. 10 mm right lower pole simple cyst (series 3/image 38), benign (Bosniak I). However, there is a 16 mm hyperdense lesion in the anterior right upper kidney (series 3/image 32), similar to the most recent prior but previously measuring 9 mm in 2021. Differential considerations include benign hemorrhagic cyst versus small papillary renal cell carcinoma. Mcclain hydronephrosis. Mildly thick-walled bladder, although underdistended. Stomach/Bowel: Stomach is within normal limits. Mcclain evidence of bowel obstruction. Normal appendix (series 3/image 60). Sigmoid diverticulosis with mild pericolonic inflammatory changes in the left lower quadrant, suggesting sigmoid diverticulitis, and a  suspected 1.5 x 2.5 cm intramural abscess (series 3/image 61). Mcclain free air. Vascular/Lymphatic: Mcclain evidence of abdominal aortic aneurysm. Atherosclerotic calcifications of the abdominal aorta and branch vessels. Mcclain suspicious abdominopelvic lymphadenopathy. Reproductive: Uterus is within normal limits. Bilateral ovaries are grossly unremarkable. Other: Mcclain abdominopelvic ascites. Musculoskeletal: Degenerative changes of the visualized thoracolumbar spine. IMPRESSION: Acute sigmoid diverticulitis with suspected 2.5 cm intramural abscess. Mcclain free air. 16 mm hyperdense lesion in the anterior right upper kidney, mildly increased from 2021. Differential considerations include benign hemorrhagic cyst versus small papillary renal cell carcinoma. Consider follow-up MRI abdomen with/without contrast in 6 months. Additional ancillary findings as above. Electronically Signed   By: SJulian HyM.D.   On: 07/21/2022 23:43   MR BRAIN WO CONTRAST  Result Date: 07/21/2022 CLINICAL DATA:  Diplopia EXAM: MRI HEAD WITHOUT CONTRAST TECHNIQUE: Multiplanar, multiecho pulse sequences of the brain and surrounding structures were obtained without intravenous contrast. COMPARISON:  None Available. FINDINGS: Brain: Mcclain acute infarct, mass effect or extra-axial collection. Mcclain acute or chronic hemorrhage. There is multifocal hyperintense T2-weighted signal within the white matter. Parenchymal volume and CSF spaces are normal. The midline structures are normal. Vascular: Major flow voids are preserved. Skull and upper cervical spine: Normal calvarium and skull base. Visualized upper cervical spine and soft tissues are normal. Sinuses/Orbits:Mcclain paranasal sinus fluid levels or advanced mucosal thickening. Mcclain mastoid or middle ear effusion. Normal orbits. IMPRESSION: 1. Mcclain acute intracranial abnormality. 2. Findings of chronic small vessel ischemia. Electronically Signed   By: KUlyses JarredM.D.   On: 07/21/2022 22:13   DG Chest Portable  1 View  Result Date: 07/21/2022 CLINICAL DATA:  Hypoxia, hypertension EXAM: PORTABLE CHEST 1 VIEW COMPARISON:  Chest 01/09/2022 FINDINGS: Heart size upper normal. Vascularity normal. Lungs clear without infiltrate or effusion. Bilateral shoulder subluxation appears chronic and unchanged. IMPRESSION: Mcclain active disease. Electronically Signed   By: CFranchot GalloM.D.   On: 07/21/2022 19:07     Labs:   Basic Metabolic Panel: Recent Labs  Lab 07/22/22 1716 07/23/22 0225 07/24/22 0337 07/25/22 0240 07/26/22 0435 07/27/22 0301  NA  --  133* 135 135 139 136  K  --  3.3* 3.0* 3.5 3.8 3.6  CL  --  99 96* 97* 99 101  CO2  --  '27 28 29 29 23  ' GLUCOSE  --  121* 96 99 103* 92  BUN  --  6* 11 14 7*  14  CREATININE  --  2.37* 3.43* 4.11* 2.38* 3.18*  CALCIUM  --  7.8* 8.4* 8.3* 8.1* 8.2*  MG  --  1.4* 2.1 2.3 2.0  --   PHOS 2.4*  --   --   --   --   --    GFR Estimated Creatinine Clearance: 13 mL/min (A) (by C-G formula based on SCr of 3.18 mg/dL (H)). Liver Function Tests: Recent Labs  Lab 07/21/22 1538  AST 9*  ALT <5  ALKPHOS 102  BILITOT 0.5  PROT 5.6*  ALBUMIN 2.9*   Mcclain results for input(s): "LIPASE", "AMYLASE" in the last 168 hours. Mcclain results for input(s): "AMMONIA" in the last 168 hours. Coagulation profile Recent Labs  Lab 07/23/22 0938  INR 1.3*    CBC: Recent Labs  Lab 07/23/22 0225 07/23/22 0938 07/23/22 0950 07/24/22 0337 07/25/22 0240 07/26/22 0435 07/27/22 0630  WBC 9.7  --   --  8.3 8.8 7.3 5.1  NEUTROABS 8.1*  --   --  6.5 7.1 5.9 4.3  HGB 6.8*  --  8.9* 8.9* 9.0* 9.1* 8.2*  HCT 22.8*  --  27.8* 28.0* 29.1* 28.8* 26.0*  MCV 85.7  --   --  85.4 86.6 84.5 85.5  PLT 200 170  --  184 199 176 139*   Cardiac Enzymes: Mcclain results for input(s): "CKTOTAL", "CKMB", "CKMBINDEX", "TROPONINI" in the last 168 hours. BNP: Invalid input(s): "POCBNP" CBG: Recent Labs  Lab 07/27/22 1153 07/27/22 2131 07/28/22 0012 07/28/22 0544 07/28/22 0729  GLUCAP 88  197* 162* 129* 121*   D-Dimer Mcclain results for input(s): "DDIMER" in the last 72 hours. Hgb A1c Mcclain results for input(s): "HGBA1C" in the last 72 hours. Lipid Profile Mcclain results for input(s): "CHOL", "HDL", "LDLCALC", "TRIG", "CHOLHDL", "LDLDIRECT" in the last 72 hours. Thyroid function studies Mcclain results for input(s): "TSH", "T4TOTAL", "T3FREE", "THYROIDAB" in the last 72 hours.  Invalid input(s): "FREET3" Anemia work up Mcclain results for input(s): "VITAMINB12", "FOLATE", "FERRITIN", "TIBC", "IRON", "RETICCTPCT" in the last 72 hours. Microbiology Mcclain results found for this or any previous visit (from the past 240 hour(s)).   Discharge Instructions:   Discharge Instructions     Discharge instructions   Complete by: As directed    Renal diet Per GI: Continue scheduled Zofran at least until she has completes antibiotics. Continue pantoprazole 40 mg bid for 2 weeks then reduce to qd.   Increase activity slowly   Complete by: As directed       Allergies as of 07/28/2022       Reactions   Morphine Nausea Only        Medication List     STOP taking these medications    apixaban 2.5 MG Tabs tablet Commonly known as: ELIQUIS   insulin NPH Human 100 UNIT/ML injection Commonly known as: NOVOLIN N   ondansetron 8 MG tablet Commonly known as: ZOFRAN       TAKE these medications    acetaminophen 500 MG tablet Commonly known as: TYLENOL Take 2 tablets (1,000 mg total) by mouth 3 (three) times daily.   albuterol 108 (90 Base) MCG/ACT inhaler Commonly known as: VENTOLIN HFA Inhale 2 puffs into the lungs every 6 (six) hours as needed for wheezing or shortness of breath.   amoxicillin-clavulanate 500-125 MG tablet Commonly known as: AUGMENTIN Take 1 tablet by mouth 2 (two) times daily.   atorvastatin 20 MG tablet Commonly known as: LIPITOR Take 20 mg by mouth daily.   B-D INS  SYR ULTRAFINE 1CC/30G 30G X 1/2" 1 ML Misc Generic drug: Insulin Syringe-Needle U-100 1  each by Other route daily.   buprenorphine 7.5 MCG/HR Commonly known as: BUTRANS Place 1 patch onto the skin once a week. Wednesday   calcium acetate 667 MG tablet Commonly known as: PHOSLO Take 667 mg by mouth in the morning, at noon, in the evening, and at bedtime.   camphor-menthol lotion Commonly known as: SARNA Apply 1 Application topically daily as needed for itching. Apply topically to trunk of body, arms legs once daily for itching   carbidopa-levodopa 25-100 MG tablet Commonly known as: SINEMET IR Take 1-2 tablets by mouth See admin instructions. Take 2 tablets by mouth in the morning and 1 tablet twice a day   docusate sodium 100 MG capsule Commonly known as: COLACE Take 200 mg by mouth at bedtime.   doxercalciferol 4 MCG/2ML injection Commonly known as: HECTOROL Inject 1 mL (2 mcg total) into the vein every Monday, Wednesday, and Friday with hemodialysis.   fluticasone 50 MCG/ACT nasal spray Commonly known as: FLONASE Place 1 spray into both nostrils 2 (two) times daily.   gabapentin 300 MG capsule Commonly known as: NEURONTIN Take 300 mg by mouth at bedtime.   glucose blood test strip 1 each by Other route daily.   Glycerin (Adult) 2 g Supp Place 1 suppository rectally daily as needed for moderate constipation.   hydrOXYzine 50 MG tablet Commonly known as: ATARAX Take 50 mg by mouth 2 (two) times daily as needed for anxiety or itching. HOLD WHEN OUT OF COMMUNITY FOR DIALYSIS TX ON Monday, Wednesday & FRIDAY   insulin aspart 100 UNIT/ML injection Commonly known as: novoLOG Inject 0-5 Units into the skin at bedtime. What changed:  how much to take when to take this additional instructions   melatonin 5 MG Tabs Take 5 mg by mouth at bedtime.   midodrine 10 MG tablet Commonly known as: PROAMATINE Take 20 mg by mouth See admin instructions. Give 20 mg on Mon, Wed, Fri prior to Dialysis treatment and 1 tablet half way through treatment   mirtazapine 15  MG tablet Commonly known as: REMERON Take 15 mg by mouth at bedtime.   Nyamyc powder Generic drug: nystatin Apply 1 Application topically daily at 12 noon. Apply topically to groin and under breast once daily   ondansetron 8 MG disintegrating tablet Commonly known as: ZOFRAN-ODT Take 1 tablet (8 mg total) by mouth every 6 (six) hours. Scheduled while on abx and then PRN after abx complete   OneTouch Delica Plus [id number] Misc 1 each by Other route daily.   OneTouch Verio Flex System w/Device Kit   OXYGEN Inhale 2 L into the lungs as needed (shortness of breath).   pantoprazole 40 MG tablet Commonly known as: PROTONIX Take 1 tablet (40 mg total) by mouth 2 (two) times daily. BID for 14 days and then daily What changed:  when to take this additional instructions   polyethylene glycol 17 g packet Commonly known as: MIRALAX / GLYCOLAX Take 17 g by mouth daily as needed for mild constipation.   promethazine 12.5 MG suppository Commonly known as: PHENERGAN Place 1 suppository (12.5 mg total) rectally every 6 (six) hours as needed for nausea or vomiting.   Ropinirole HCl 12 MG Tb24 Take 12 mg by mouth daily.   simethicone 125 MG chewable tablet Commonly known as: MYLICON Chew 062 mg by mouth every 12 (twelve) hours as needed for flatulence.   tamsulosin 0.4 MG  Caps capsule Commonly known as: FLOMAX Take 1 capsule (0.4 mg total) by mouth daily. What changed: when to take this          Time coordinating discharge: 45 min  Signed:  Geradine Girt DO  [facility] 07/28/2022, 8:12 AM

## 2022-07-28 NOTE — Progress Notes (Signed)
Pt receives out-pt HD at [facility] on MWF. Pt arrives at 9:40 for 10:00 am chair time. Contacted clinic to make staff aware of pt's d/c today and that pt will resume care tomorrow.   Melven Sartorius Renal Navigator [phone]

## 2022-07-28 NOTE — Progress Notes (Signed)
Mobility Specialist - Progress Note   07/28/22 0900  Mobility  Activity Moved into chair position in bed  Level of Assistance Moderate assist, patient does 50-74% (+2)  Assistive Device None  Distance Ambulated (ft) 0 ft  Activity Response Tolerated well  $Mobility charge 1 Mobility    Pt received in bed requesting to be pulled up in bed. Rearranged in bed and moved into chair position in bed. Left in bed w/ RN in room.   Paulla Dolly Mobility Specialist

## 2022-07-28 NOTE — Progress Notes (Signed)
Nsg Discharge Note  Admit Date:  07/21/2022 Discharge date: 07/28/2022   Rexene Edison to be D/C'd Home per MD order.  AVS completed.   Removed IV-CDI. Reviewed d/c paperwork with patient. Wheeled stable patient to main entrance and she was placed in a wheelchair [place] for transport to facility. Patient/caregiver able to verbalize understanding.  Discharge Medication: Allergies as of 07/28/2022       Reactions   Morphine Nausea Only        Medication List     STOP taking these medications    apixaban 2.5 MG Tabs tablet Commonly known as: ELIQUIS   insulin NPH Human 100 UNIT/ML injection Commonly known as: NOVOLIN N   ondansetron 8 MG tablet Commonly known as: ZOFRAN       TAKE these medications    acetaminophen 500 MG tablet Commonly known as: TYLENOL Take 2 tablets (1,000 mg total) by mouth 3 (three) times daily.   albuterol 108 (90 Base) MCG/ACT inhaler Commonly known as: VENTOLIN HFA Inhale 2 puffs into the lungs every 6 (six) hours as needed for wheezing or shortness of breath.   amoxicillin-clavulanate 500-125 MG tablet Commonly known as: AUGMENTIN Take 1 tablet by mouth 2 (two) times daily.   atorvastatin 20 MG tablet Commonly known as: LIPITOR Take 20 mg by mouth daily.   B-D INS SYR ULTRAFINE 1CC/30G 30G X 1/2" 1 ML Misc Generic drug: Insulin Syringe-Needle U-100 1 each by Other route daily.   buprenorphine 7.5 MCG/HR Commonly known as: BUTRANS Place 1 patch onto the skin once a week. Wednesday   calcium acetate 667 MG tablet Commonly known as: PHOSLO Take 667 mg by mouth in the morning, at noon, in the evening, and at bedtime.   camphor-menthol lotion Commonly known as: SARNA Apply 1 Application topically daily as needed for itching. Apply topically to trunk of body, arms legs once daily for itching   carbidopa-levodopa 25-100 MG tablet Commonly known as: SINEMET IR Take 1-2 tablets by mouth See admin instructions. Take 2 tablets by  mouth in the morning and 1 tablet twice a day   docusate sodium 100 MG capsule Commonly known as: COLACE Take 200 mg by mouth at bedtime.   doxercalciferol 4 MCG/2ML injection Commonly known as: HECTOROL Inject 1 mL (2 mcg total) into the vein every Monday, Wednesday, and Friday with hemodialysis.   fluticasone 50 MCG/ACT nasal spray Commonly known as: FLONASE Place 1 spray into both nostrils 2 (two) times daily.   gabapentin 300 MG capsule Commonly known as: NEURONTIN Take 300 mg by mouth at bedtime.   glucose blood test strip 1 each by Other route daily.   Glycerin (Adult) 2 g Supp Place 1 suppository rectally daily as needed for moderate constipation.   hydrOXYzine 50 MG tablet Commonly known as: ATARAX Take 50 mg by mouth 2 (two) times daily as needed for anxiety or itching. HOLD WHEN OUT OF COMMUNITY FOR DIALYSIS TX ON Monday, Wednesday & FRIDAY   insulin aspart 100 UNIT/ML injection Commonly known as: novoLOG Inject 0-5 Units into the skin at bedtime. What changed:  how much to take when to take this additional instructions   melatonin 5 MG Tabs Take 5 mg by mouth at bedtime.   midodrine 10 MG tablet Commonly known as: PROAMATINE Take 20 mg by mouth See admin instructions. Give 20 mg on Mon, Wed, Fri prior to Dialysis treatment and 1 tablet half way through treatment   mirtazapine 15 MG tablet Commonly known as: REMERON  Take 15 mg by mouth at bedtime.   Nyamyc powder Generic drug: nystatin Apply 1 Application topically daily at 12 noon. Apply topically to groin and under breast once daily   ondansetron 8 MG disintegrating tablet Commonly known as: ZOFRAN-ODT Take 1 tablet (8 mg total) by mouth every 6 (six) hours. Scheduled while on abx and then PRN after abx complete   OneTouch Delica Plus [id number] Misc 1 each by Other route daily.   OneTouch Verio Flex System w/Device Kit   OXYGEN Inhale 2 L into the lungs as needed (shortness of breath).    pantoprazole 40 MG tablet Commonly known as: PROTONIX Take 1 tablet (40 mg total) by mouth 2 (two) times daily. BID for 14 days and then daily What changed:  when to take this additional instructions   polyethylene glycol 17 g packet Commonly known as: MIRALAX / GLYCOLAX Take 17 g by mouth daily as needed for mild constipation.   promethazine 12.5 MG suppository Commonly known as: PHENERGAN Place 1 suppository (12.5 mg total) rectally every 6 (six) hours as needed for nausea or vomiting.   Ropinirole HCl 12 MG Tb24 Take 12 mg by mouth daily.   simethicone 125 MG chewable tablet Commonly known as: MYLICON Chew 212 mg by mouth every 12 (twelve) hours as needed for flatulence.   tamsulosin 0.4 MG Caps capsule Commonly known as: FLOMAX Take 1 capsule (0.4 mg total) by mouth daily. What changed: when to take this        Discharge Assessment: Vitals:   07/28/22 0745 07/28/22 1638  BP: (!) 128/52 (!) 116/38  Pulse: 65 77  Resp: 16 16  Temp: 97.7 F (36.5 C) 98.4 F (36.9 C)  SpO2: 100% 98%   Skin clean, dry and intact without evidence of skin break down, no evidence of skin tears noted. IV catheter discontinued intact. Site without signs and symptoms of complications - no redness or edema noted at insertion site, patient denies c/o pain - only slight tenderness at site.  Dressing with slight pressure applied.  D/c Instructions-Education: Discharge instructions given to patient/family with verbalized understanding. D/c education completed with patient/family including follow up instructions, medication list, d/c activities limitations if indicated, with other d/c instructions as indicated by MD - patient able to verbalize understanding, all questions fully answered. Patient instructed to return to ED, call 911, or call MD for any changes in condition.  Patient escorted via [place], and D/C home via private auto.  Santa Lighter, RN 07/28/2022 5:55 PM

## 2022-07-28 NOTE — TOC Progression Note (Signed)
Transition of Care [facility]) - Progression Note    Patient Details  Name: Jean Mcclain MRN: [id number] Date of Birth: November 01, 1945  [facility] CM/SW Contact  Reece Agar, [place] Phone Number: 07/28/2022, 9:54 AM  Clinical Narrative:    CSW contacted [facility] to confirm that pt still has transportation set up to and from HD. The representative confirmed that she is still scheduled for pick up form her ALF.    Expected Discharge Plan: [place] Barriers to Discharge: Continued Medical Work up  Expected Discharge Plan and Services Expected Discharge Plan: [place] arrangements for the past 2 months: [place] Expected Discharge Date: 07/28/22                                     Social Determinants of Health (SDOH) Interventions    Readmission Risk Interventions    01/25/2020    8:31 AM  Readmission Risk Prevention Plan  Transportation Screening Complete  PCP or Specialist Appt within 5-7 Days Not Complete  Not Complete comments d/c to SNF  Home Care Screening Complete  Medication Review (RN CM) Referral to Pharmacy

## 2022-07-28 NOTE — TOC Transition Note (Addendum)
Transition of Care [facility]) - CM/SW Discharge Note   Patient Details  Name: Jean Mcclain MRN: [id number] Date of Birth: Mar 12, 1946  Transition of [facility]) CM/SW Contact:  [name] Phone Number: 07/28/2022, 2:37 PM   Clinical Narrative:    [facility] to[facility] Anticipated DC date: 07/28/2022 Family notified: Brother Transport by: Safe Transport   Per MD patient ready for DC to [place]. RN to call report prior to discharge [phone]). RN, patient, patient's family, and facility notified of DC. Discharge Summary and FL2 sent to facility. DC packet on chart. Safe transport requested for patient.   CSW will sign off for now as social work intervention is no longer needed. Please consult [place] again if new needs arise.       Barriers to Discharge: Continued Medical Work up   Patient Goals and CMS Choice Patient states their goals for this hospitalization and ongoing recovery are:: To return to ALF CMS Medicare.gov Compare Post Acute Care list provided to:: Patient Choice offered to / list presented to : Patient  Discharge Placement                       Discharge Plan and Services                                     Social Determinants of Health (SDOH) Interventions     Readmission Risk Interventions    01/25/2020    8:31 AM  Readmission Risk Prevention Plan  Transportation Screening Complete  PCP or Specialist Appt within 5-7 Days Not Complete  Not Complete comments d/c to SNF  Home Care Screening Complete  Medication Review (RN CM) Referral to Pharmacy

## 2022-07-28 NOTE — TOC CM/SW Note (Signed)
Spoke to [place] with [employer]. [facility] can provide HHPT/OT orders faxed to [facility]

## 2022-07-28 NOTE — Progress Notes (Addendum)
[facility] Progress Note   Subjective:   Patient sleeping upon entering the room.  Easily awoken.  Reports tolerating diet yesterday.  Signed off dialysis 1 hour early d/t clotting machine.  Denies CP, SOB, abdominal pain, edema, orthopnea, and n/v/d. To d/c home today.  Objective Vitals:   07/27/22 1730 07/27/22 1901 07/28/22 0557 07/28/22 0745  BP: 102/66 (!) 112/53 (!) 134/56 (!) 128/52  Pulse: (!) 54 74 67 65  Resp: (!) '25 16  16  '$ Temp:  98 F (36.7 C) 98.3 F (36.8 C) 97.7 F (36.5 C)  TempSrc:  Oral Oral Oral  SpO2:  99% 97% 100%  Weight:      Height:       Physical Exam General:chronically ill appearing female in NAD Heart:RRR, nomrg Lungs:CTAB, nml WOB on RA Abdomen:soft, NTND Extremities:no LE edema Dialysis Access: RU AVF +b/t   Filed Weights   07/22/22 2040 07/22/22 2242 07/26/22 0144  Weight: 66.6 kg 70.2 kg 68.3 kg    Intake/Output Summary (Last 24 hours) at 07/28/2022 0942 Last data filed at 07/28/2022 0600 Gross per 24 hour  Intake 290 ml  Output 0 ml  Net 290 ml    Additional Objective Labs: Basic Metabolic Panel: Recent Labs  Lab 07/22/22 1716 07/23/22 0225 07/25/22 0240 07/26/22 0435 07/27/22 0301  NA  --    < > 135 139 136  K  --    < > 3.5 3.8 3.6  CL  --    < > 97* 99 101  CO2  --    < > '29 29 23  '$ GLUCOSE  --    < > 99 103* 92  BUN  --    < > 14 7* 14  CREATININE  --    < > 4.11* 2.38* 3.18*  CALCIUM  --    < > 8.3* 8.1* 8.2*  PHOS 2.4*  --   --   --   --    < > = values in this interval not displayed.   Liver Function Tests: Recent Labs  Lab 07/21/22 1538  AST 9*  ALT <5  ALKPHOS 102  BILITOT 0.5  PROT 5.6*  ALBUMIN 2.9*   CBC: Recent Labs  Lab 07/23/22 0225 07/23/22 0938 07/24/22 0337 07/25/22 0240 07/26/22 0435 07/27/22 0630  WBC 9.7  --  8.3 8.8 7.3 5.1  NEUTROABS 8.1*  --  6.5 7.1 5.9 4.3  HGB 6.8*   < > 8.9* 9.0* 9.1* 8.2*  HCT 22.8*   < > 28.0* 29.1* 28.8* 26.0*  MCV 85.7  --  85.4 86.6  84.5 85.5  PLT 200   < > 184 199 176 139*   < > = values in this interval not displayed.   CBG: Recent Labs  Lab 07/27/22 1153 07/27/22 2131 07/28/22 0012 07/28/22 0544 07/28/22 0729  GLUCAP 88 197* 162* 129* 121*   Medications:  promethazine (PHENERGAN) injection (IM or IVPB)      amoxicillin-clavulanate  1 tablet Oral BID   atorvastatin  20 mg Oral Daily   buprenorphine  1 patch Transdermal Weekly   carbidopa-levodopa  1 tablet Oral BID   carbidopa-levodopa  2 tablet Oral q AM   Chlorhexidine Gluconate Cloth  6 each Topical Q0600   Chlorhexidine Gluconate Cloth  6 each Topical Q0600   docusate sodium  200 mg Oral BID   doxercalciferol  2 mcg Intravenous Q M,W,F-HD   feeding supplement (NEPRO CARB STEADY)  237 mL Oral BID BM  feeding supplement (PROSource TF20)  60 mL Per Tube Daily   fluticasone  1 spray Each Nare BID   gabapentin  300 mg Oral QHS   insulin aspart  0-6 Units Subcutaneous Q4H   melatonin  5 mg Oral QHS   midodrine  20 mg Oral Q M,W,F-HD   mirtazapine  15 mg Oral QHS   multivitamin  1 tablet Oral QHS   nystatin  1 Application Topical V4259   ondansetron  4 mg Oral Q6H WA   pantoprazole (PROTONIX) IV  40 mg Intravenous Q12H   polyethylene glycol  17 g Oral BID   rOPINIRole  12 mg Oral Daily   tamsulosin  0.4 mg Oral QPC supper    Dialysis Orders: MWF [facility]  3.5h  400/1.5  70kg   2/2 bath  Hep none  RUA AVF - last HD 10/11 post wt 71.1kg - last Hb 7.3 - mircera 225 ug q 2, last 10/9 - calcitriol 0.5 ug po tiw mwf     CXR - no active disease     Problem/Plan: Diverticulitis with small abscess: surgery consulted no need for surgery currently IV antibiotics (Rocephin and metronidazole ) Surgery signed off.  ESRD -HD MWF. K 3.6.  HD tomorrow per regular schedule. Can be completed as outpatient.  N/V - GI consulted.  Improved, possibly medication related.  GI signed off. No plans for EGD at this time.  HTN/volume: history of chronic hypotension on  midodrine 20 mg predialysis.  Does not appear volume overloaded, continue for UF as tolerated.  Anemia of chronic disease/acute illness:Hgb 8.2, s/p 1 unit RBC on 10/14, TSAT 38%,  continue high-dose ESA next due 10/23, transfuse  prn Hgb< 7 Secondary hyperparathyroidism - CCa in range, phosphorus 2.4 holding binder (calcium acetate ). Restart when diet advanced. IDDM -per admit team Nutrition - on soft diet.  Alb 2.9. Add protein supplement.  Renal diet w/fluid restrictions once advanced.  Hx DVT - anticoagulation on hold.  Jen Mow, PA-C [facility] 07/28/2022,9:42 AM  LOS: 6 days

## 2022-07-28 NOTE — NC FL2 (Signed)
[place] MEDICAID FL2 LEVEL OF CARE SCREENING TOOL     IDENTIFICATION  Patient Name: Jean Mcclain Birthdate: [birth year] Sex: female Admission Date (Current Location): 07/21/2022  [facility] and [place] Number:  Herbalist and Address:  [facility] [place] [address] [place], [place] [address]      Provider Number: [id number]  Attending Physician Name and Address:  Geradine Girt, DO  Relative Name and Phone Number:       Current Level of Care: Hospital Recommended Level of Care: [place] Prior Approval Number:    Date Approved/Denied:   PASRR Number: [id number]  Discharge Plan: Other (Comment) [facility])    Current Diagnoses: Patient Active Problem List   Diagnosis Date Noted   Syncope 07/28/2022   Nausea and vomiting    Food aversion    Blurry vision, bilateral 07/22/2022   Acute diverticulitis 07/22/2022   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 06/22/2022   Other constipation 02/12/2022   Obstipation 02/12/2022   Pressure injury of back, stage 1 01/16/2022   Thoracic spine fracture ([place]) 01/09/2022   Hyperkalemia 01/09/2022   Anxiety 01/09/2022   History of DVT (deep vein thrombosis) 01/09/2022   Gout 01/09/2022   Postmenopausal vaginal bleeding    Acute blood loss anemia 06/25/2020   Acute on chronic respiratory failure with hypoxia and hypercapnia ([facility]) 06/25/2020   Advanced care planning/counseling discussion    Goals of care, counseling/discussion    Obstructive sleep apnea    Primary osteoarthritis, left ankle and foot    Ankle fracture 06/14/2020   HLD (hyperlipidemia) 06/14/2020   Dyspnea, unspecified 02/05/2020   Intractable pain 01/24/2020   Bilateral shoulder pain 01/23/2020   Right knee pain 01/23/2020   Uncontrolled type 2 diabetes mellitus with hyperglycemia, with long-term current use of insulin ([place]) 01/23/2020   Pressure injury of skin 01/22/2020   Mild protein-calorie malnutrition  ([place]) 12/16/2019   Personal history of anaphylaxis 12/05/2019   Pruritus, unspecified 12/05/2019   ESRD on hemodialysis ([facility]) 11/20/2019   Myoclonus, segmental 11/20/2019   Allergy, unspecified, initial encounter 07/29/2019   Anemia in chronic kidney disease 07/15/2019   Coagulation defect, unspecified ([place]) 07/13/2019   Iron deficiency anemia, unspecified 07/11/2019   Encounter for immunization 07/06/2019   End stage renal disease ([place]) 07/02/2019   GERD (gastroesophageal reflux disease) 07/02/2019   Low back pain 07/02/2019   BMI 34.0-34.9,adult 07/02/2019   Other specified disorders of bone density and structure, unspecified site 07/02/2019   Secondary hyperparathyroidism of renal origin ([place]) 07/02/2019   Unspecified osteoarthritis, unspecified site 07/02/2019   OSA (obstructive sleep apnea) 03/13/2019   Chronic acquired lymphedema 11/05/2018   Kidney lesion 03/30/2017   Insulin-requiring or dependent type II diabetes mellitus ([place]) 03/11/2017   Essential hypertension 03/11/2017   Impingement syndrome of left ankle 01/31/2017   Posterior tibial tendinitis, left leg 01/31/2017   Incarcerated ventral hernia 01/02/2013    Orientation RESPIRATION BLADDER Height & Weight     Time, Self, Situation, Place  Normal Continent Weight: 150 lb 9.2 oz (68.3 kg) Height:  5' (152.4 cm)  BEHAVIORAL SYMPTOMS/MOOD NEUROLOGICAL BOWEL NUTRITION STATUS      Continent Diet  AMBULATORY STATUS COMMUNICATION OF NEEDS Skin   Limited Assist Verbally Normal                       Personal Care Assistance Level of Assistance  Bathing, Feeding, Dressing Bathing Assistance: Limited assistance Feeding  assistance: Independent Dressing Assistance: Limited assistance     Functional Limitations Info  Sight, Hearing, Speech Sight Info: Adequate Hearing Info: Adequate Speech Info: Adequate    SPECIAL CARE FACTORS FREQUENCY  PT (By licensed PT), OT (By licensed OT)     PT Frequency: 3x a  week OT Frequency: 3x a week            Contractures Contractures Info: Not present    Additional Factors Info  Code Status, Allergies Code Status Info: DNR Allergies Info: Morphine           Current Medications (07/28/2022):  This is the current hospital active medication list Current Facility-Administered Medications  Medication Dose Route Frequency Provider Last Rate Last Admin   acetaminophen (TYLENOL) tablet 650 mg  650 mg Oral Q6H PRN Etta Quill, DO   650 mg at 07/23/22 1520   Or   acetaminophen (TYLENOL) suppository 650 mg  650 mg Rectal Q6H PRN Etta Quill, DO       albuterol (PROVENTIL) (2.5 MG/3ML) 0.083% nebulizer solution 3 mL  3 mL Inhalation Q6H PRN Etta Quill, DO       amoxicillin-clavulanate (AUGMENTIN) 500-125 MG per tablet 1 tablet  1 tablet Oral BID Thurnell Lose, MD   1 tablet at 07/28/22 1029   atorvastatin (LIPITOR) tablet 20 mg  20 mg Oral Daily Jennette Kettle M, DO   20 mg at 07/28/22 7846   buprenorphine (BUTRANS) 7.5 MCG/HR 1 patch  1 patch Transdermal Weekly Jennette Kettle M, DO       camphor-menthol [facility]) lotion 1 Application  1 Application Topical Daily PRN Etta Quill, DO       carbidopa-levodopa (SINEMET IR) 25-100 MG per tablet immediate release 1 tablet  1 tablet Oral BID Etta Quill, DO   1 tablet at 07/23/22 2209   carbidopa-levodopa (SINEMET IR) 25-100 MG per tablet immediate release 2 tablet  2 tablet Oral q AM Etta Quill, DO   2 tablet at 07/25/22 1026   Chlorhexidine Gluconate Cloth 2 % PADS 6 each  6 each Topical Q0600 Roney Jaffe, MD   6 each at 07/28/22 0605   Chlorhexidine Gluconate Cloth 2 % PADS 6 each  6 each Topical Q0600 Ernest Haber, PA-C   6 each at 07/28/22 9629   docusate sodium (COLACE) capsule 200 mg  200 mg Oral BID Thurnell Lose, MD   200 mg at 07/27/22 2156   doxercalciferol (HECTOROL) injection 2 mcg  2 mcg Intravenous Q M,W,F-HD Jennette Kettle M, DO   2 mcg at 07/27/22 1838    feeding supplement (NEPRO CARB STEADY) liquid 237 mL  237 mL Oral BID BM Thurnell Lose, MD   237 mL at 07/26/22 0859   feeding supplement (PROSource TF20) liquid 60 mL  60 mL Per Tube Daily Penninger, Lindsay, PA   60 mL at 07/28/22 0914   fluticasone (FLONASE) 50 MCG/ACT nasal spray 1 spray  1 spray Each Nare BID Etta Quill, DO   1 spray at 07/28/22 0914   gabapentin (NEURONTIN) capsule 300 mg  300 mg Oral QHS Jennette Kettle M, DO   300 mg at 07/27/22 2157   Glycerin (Adult) 2 g suppository 1 suppository  1 suppository Rectal Daily PRN Johnathan Hausen, MD       hydrOXYzine (ATARAX) tablet 50 mg  50 mg Oral BID PRN Etta Quill, DO       insulin aspart (novoLOG) injection 0-6 Units  0-6 Units Subcutaneous Q4H Jennette Kettle M, DO   2 Units at 07/28/22 1218   melatonin tablet 5 mg  5 mg Oral QHS Jennette Kettle M, DO   5 mg at 07/27/22 2157   midodrine (PROAMATINE) tablet 10 mg  10 mg Oral TID PRN Thurnell Lose, MD       midodrine (PROAMATINE) tablet 20 mg  20 mg Oral Q M,W,F-HD Etta Quill, DO   20 mg at 07/27/22 1143   mirtazapine (REMERON) tablet 15 mg  15 mg Oral QHS Etta Quill, DO   15 mg at 07/27/22 2157   multivitamin (RENA-VIT) tablet 1 tablet  1 tablet Oral QHS Thurnell Lose, MD   1 tablet at 07/27/22 2157   nystatin (MYCOSTATIN/NYSTOP) topical powder 1 Application  1 Application Topical [id number] Etta Quill, DO   1 Application at 51/10/21 1219   ondansetron (ZOFRAN-ODT) disintegrating tablet 4 mg  4 mg Oral Q6H WA Vann, Jessica U, DO   4 mg at 07/28/22 0917   pantoprazole (PROTONIX) injection 40 mg  40 mg Intravenous Q12H Thurnell Lose, MD   40 mg at 07/28/22 0918   phenol (CHLORASEPTIC) mouth spray 1 spray  1 spray Mouth/Throat PRN Eulogio Bear U, DO       polyethylene glycol (MIRALAX / GLYCOLAX) packet 17 g  17 g Oral BID Thurnell Lose, MD   17 g at 07/27/22 2156   promethazine (PHENERGAN) 12.5 mg in sodium chloride 0.9 % 50 mL IVPB  12.5 mg  Intravenous Q8H PRN Vann, Jessica U, DO       rOPINIRole (REQUIP XL) 24 hr tablet 12 mg  12 mg Oral Daily Jennette Kettle M, DO   12 mg at 07/28/22 1173   simethicone (MYLICON) chewable tablet 120 mg  120 mg Oral Q12H PRN Etta Quill, DO   120 mg at 07/26/22 1134   tamsulosin (FLOMAX) capsule 0.4 mg  0.4 mg Oral QPC supper Etta Quill, DO   0.4 mg at 07/26/22 1759     Discharge Medications: Please see discharge summary for a list of discharge medications.  Relevant Imaging Results:  Relevant Lab Results:   Additional Information [id number]  Reece Agar, [place]

## 2022-07-28 NOTE — Plan of Care (Signed)
  Problem: Coping: Goal: Ability to adjust to condition or change in health will improve Outcome: Progressing   

## 2022-07-28 NOTE — Plan of Care (Signed)
  Problem: Education: Goal: Ability to describe self-care measures that may prevent or decrease complications (Diabetes Survival Skills Education) will improve Outcome: Adequate for Discharge Goal: Individualized Educational Video(s) Outcome: Adequate for Discharge   Problem: Coping: Goal: Ability to adjust to condition or change in health will improve 07/28/2022 1132 by Santa Lighter, RN Outcome: Adequate for Discharge 07/28/2022 0947 by Santa Lighter, RN Outcome: Progressing   Problem: Fluid Volume: Goal: Ability to maintain a balanced intake and output will improve Outcome: Adequate for Discharge   Problem: Health Behavior/Discharge Planning: Goal: Ability to identify and utilize available resources and services will improve Outcome: Adequate for Discharge Goal: Ability to manage health-related needs will improve Outcome: Adequate for Discharge   Problem: Metabolic: Goal: Ability to maintain appropriate glucose levels will improve Outcome: Adequate for Discharge   Problem: Nutritional: Goal: Maintenance of adequate nutrition will improve Outcome: Adequate for Discharge Goal: Progress toward achieving an optimal weight will improve Outcome: Adequate for Discharge   Problem: Skin Integrity: Goal: Risk for impaired skin integrity will decrease Outcome: Adequate for Discharge   Problem: Tissue Perfusion: Goal: Adequacy of tissue perfusion will improve Outcome: Adequate for Discharge   Problem: Education: Goal: Knowledge of General Education information will improve Description: Including pain rating scale, medication(s)/side effects and non-pharmacologic comfort measures Outcome: Adequate for Discharge   Problem: Health Behavior/Discharge Planning: Goal: Ability to manage health-related needs will improve Outcome: Adequate for Discharge   Problem: Clinical Measurements: Goal: Ability to maintain clinical measurements within normal limits will improve Outcome:  Adequate for Discharge Goal: Will remain free from infection Outcome: Adequate for Discharge Goal: Diagnostic test results will improve Outcome: Adequate for Discharge Goal: Respiratory complications will improve Outcome: Adequate for Discharge Goal: Cardiovascular complication will be avoided Outcome: Adequate for Discharge   Problem: Activity: Goal: Risk for activity intolerance will decrease Outcome: Adequate for Discharge   Problem: Nutrition: Goal: Adequate nutrition will be maintained Outcome: Adequate for Discharge   Problem: Coping: Goal: Level of anxiety will decrease Outcome: Adequate for Discharge   Problem: Elimination: Goal: Will not experience complications related to bowel motility Outcome: Adequate for Discharge Goal: Will not experience complications related to urinary retention Outcome: Adequate for Discharge   Problem: Pain Managment: Goal: General experience of comfort will improve Outcome: Adequate for Discharge   Problem: Safety: Goal: Ability to remain free from injury will improve Outcome: Adequate for Discharge   Problem: Skin Integrity: Goal: Risk for impaired skin integrity will decrease Outcome: Adequate for Discharge   Problem: Acute Rehab PT Goals(only PT should resolve) Goal: Pt Will Go Supine/Side To Sit Outcome: Adequate for Discharge Goal: Patient Will Transfer Sit To/From Stand Outcome: Adequate for Discharge Goal: Pt Will Transfer Bed To Chair/Chair To Bed Outcome: Adequate for Discharge Goal: Pt Will Ambulate Outcome: Adequate for Discharge

## 2022-08-08 ENCOUNTER — Ambulatory Visit: Payer: HMO | Admitting: Physician Assistant

## 2022-08-30 ENCOUNTER — Ambulatory Visit (INDEPENDENT_AMBULATORY_CARE_PROVIDER_SITE_OTHER): Payer: HMO | Admitting: Nurse Practitioner

## 2022-08-30 ENCOUNTER — Encounter: Payer: Self-pay | Admitting: Nurse Practitioner

## 2022-08-30 VITALS — BP 118/62 | HR 75 | Ht 60.0 in | Wt 156.0 lb

## 2022-08-30 DIAGNOSIS — N186 End stage renal disease: Secondary | ICD-10-CM | POA: Diagnosis not present

## 2022-08-30 DIAGNOSIS — R112 Nausea with vomiting, unspecified: Secondary | ICD-10-CM | POA: Diagnosis not present

## 2022-08-30 DIAGNOSIS — Z992 Dependence on renal dialysis: Secondary | ICD-10-CM

## 2022-08-30 DIAGNOSIS — K5792 Diverticulitis of intestine, part unspecified, without perforation or abscess without bleeding: Secondary | ICD-10-CM | POA: Diagnosis not present

## 2022-08-30 NOTE — Patient Instructions (Signed)
Our office will contact your to schedule a follow up abdominal/Pelvic CT scan.  Thank you for trusting me with your gastrointestinal care!   Carl Best, CRNP

## 2022-08-30 NOTE — Progress Notes (Signed)
08/30/2022 Jean Mcclain [id number] [birth year]   CHIEF COMPLAINT: Hospital follow up, nausea/vomiting   HISTORY OF PRESENT ILLNESS:  Jean Mcclain is a 76 year old female with a past medical history of anxiety, arthritis, hypertension, hyperlipidemia, diabetes mellitus type 2, ESRD on hemodialysis, DVT (no longer on Eliquis), chronic anemia, obstructive sleep apnea, Parkinson's disease, GERD and diverticulitis with an abscess.   She was seen by Dr. Fuller Plan during her hospital admission 07/26/2022 due to having nausea, vomiting and food aversion. CTAP showed diverticulitis with a small abscess treated with antibiotics. An EGD was scheduled but was subsequently canceled as she ate prior to the procedure. Her nausea and vomiting stabilized and she was discharged to [facility] on 07/28/2022.   She presents today for further GI follow up and to determined if an upper endoscopy is warranted. She denies having any further nausea, vomiting or significant abdominal pain. She had mild central abdominal pain yesterday which lasted for a few hours yesterday which abated. No further abdominal pain since then. Her N/V was likely due to her acute illness with diverticulitis with an abscess. She is passing a normal formed brown stool daily. No rectal bleeding or black stools. No GERD symptoms of Pantoprazole 32m po bid. She has dialysis every Mon-Wed-Friday.  She reported having labs done during her dialysis earlier this week. She denied ever having a screening colonoscopy.       Latest Ref Rng & Units 07/27/2022    6:30 AM 07/26/2022    4:35 AM 07/25/2022    2:40 AM  CBC  WBC 4.0 - 10.5 K/uL 5.1  7.3  8.8   Hemoglobin 12.0 - 15.0 g/dL 8.2  9.1  9.0   Hematocrit 36.0 - 46.0 % 26.0  28.8  29.1   Platelets 150 - 400 K/uL 139  176  199         Latest Ref Rng & Units 07/27/2022    3:01 AM 07/26/2022    4:35 AM 07/25/2022    2:40 AM  CMP  Glucose 70 - 99 mg/dL 92  103  99   BUN 8 - 23  mg/dL _0 Creatinine 0.44 - 1.00 mg/dL 3.18  2.38  4.11   Sodium 135 - 145 mmol/L 136  139  135   Potassium 3.5 - 5.1 mmol/L 3.6  3.8  3.5   Chloride 98 - 111 mmol/L 101  99  97   CO2 22 - 32 mmol/L _1 Calcium 8.9 - 10.3 mg/dL 8.2  8.1  8.3      Past Medical History:  Diagnosis Date   Acute and chronic respiratory failure (acute-on-chronic) (HCC)    Acute deep vein thrombosis (DVT) of popliteal vein of left lower extremity ([place] 03/11/2017   Anxiety    Arthritis    osteo   Diabetes mellitus, type 2 (HCC)    Type II   DVT (deep venous thrombosis) ([facility] 03/2017   left popliteal   ESRD on hemodialysis (HCC)    Facet joint disease    Facet joint disease    foot   Fibula fracture 02/2017   left   GERD (gastroesophageal reflux disease)    H/O blood clots    Hyperlipidemia    Hypertension    Macrocytic anemia    Morbid obesity (HCC)    OSA (obstructive sleep apnea)    does not use CPAP    Osteopenia  Renal disorder    MWF dialysis   Shortness of breath    with exertion   Supplemental oxygen dependent    Venous stasis ulcers (HCC)    Vitamin D deficiency    Past Surgical History:  Procedure Laterality Date   ANKLE FUSION Left 06/24/2020   Procedure: LEFT TIBIOCALCANEAL FUSION;  Surgeon: [name], MD;  Location: [place];  Service: Orthopedics;  Laterality: Left;   AV FISTULA PLACEMENT Right 08/20/2018   Procedure: ARTERIOVENOUS (AV) FISTULA CREATION BRACHIOCEPHALIC;  Surgeon: Angelia Mould, MD;  Location: [place];  Service: Vascular;  Laterality: Right;   BREAST SURGERY Right 03/1997   biospy   CARPAL TUNNEL RELEASE Left 2016   HYSTEROSCOPY WITH D & C N/A 09/29/2020   Procedure: DILATATION AND CURETTAGE /HYSTEROSCOPY;  Surgeon: Mora Bellman, MD;  Location: [facility];  Service: Gynecology;  Laterality: N/A;   INSERTION OF MESH N/A 01/08/2013   Procedure: INSERTION OF MESH;  Surgeon: Earnstine Regal, MD;  Location: [facility];  Service: General;   Laterality: N/A;   IR THORACENTESIS ASP PLEURAL SPACE W/IMG GUIDE  04/06/2017   JOINT REPLACEMENT Right 1999   TOTAL KNEE ARTHROPLASTY Left 1999   Left   TRIGGER FINGER RELEASE Left 11/2014   TRIGGER FINGER RELEASE Right    VENTRAL HERNIA REPAIR N/A 01/08/2013   Procedure: LAPAROSCOPIC VENTRAL HERNIA;  Surgeon: Earnstine Regal, MD;  Location: [facility];  Service: General;  Laterality: N/A;   Social History:  She reports that she quit smoking about 40 years ago. Her smoking use included cigarettes. She has never used smokeless tobacco. She reports that she does not drink alcohol and does not use drugs.  Family History: family history includes Emphysema in her father; Hypertension in her brother; Leukemia in her mother.  Allergies  Allergen Reactions   Morphine Nausea Only    Pt states not allergic       Outpatient Encounter Medications as of 08/30/2022  Medication Sig   acetaminophen (TYLENOL) 500 MG tablet Take 2 tablets (1,000 mg total) by mouth 3 (three) times daily.   albuterol (VENTOLIN HFA) 108 (90 Base) MCG/ACT inhaler Inhale 2 puffs into the lungs every 6 (six) hours as needed for wheezing or shortness of breath.    amoxicillin-clavulanate (AUGMENTIN) 500-125 MG tablet Take 1 tablet by mouth 2 (two) times daily.   atorvastatin (LIPITOR) 20 MG tablet Take 20 mg by mouth daily.   B-D INS SYR ULTRAFINE 1CC/30G 30G X 1/2" 1 ML MISC 1 each by Other route daily.    Blood Glucose Monitoring Suppl (ONETOUCH VERIO FLEX SYSTEM) w/Device KIT    buprenorphine (BUTRANS) 7.5 MCG/HR Place 1 patch onto the skin once a week. Wednesday   calcium acetate (PHOSLO) 667 MG tablet Take 667 mg by mouth in the morning, at noon, in the evening, and at bedtime.    camphor-menthol (SARNA) lotion Apply 1 Application topically daily as needed for itching. Apply topically to trunk of body, arms legs once daily for itching   carbidopa-levodopa (SINEMET IR) 25-100 MG tablet Take 1-2 tablets by mouth See admin  instructions. Take 2 tablets by mouth in the morning and 1 tablet twice a day   docusate sodium (COLACE) 100 MG capsule Take 200 mg by mouth at bedtime.   doxercalciferol (HECTOROL) 4 MCG/2ML injection Inject 1 mL (2 mcg total) into the vein every Monday, Wednesday, and Friday with hemodialysis.   fluticasone (FLONASE) 50 MCG/ACT nasal spray Place 1 spray into both nostrils  2 (two) times daily.   gabapentin (NEURONTIN) 300 MG capsule Take 300 mg by mouth at bedtime.   glucose blood test strip 1 each by Other route daily.    Glycerin, Laxative, (GLYCERIN, ADULT,) 2 g SUPP Place 1 suppository rectally daily as needed for moderate constipation.   hydrOXYzine (ATARAX) 50 MG tablet Take 50 mg by mouth 2 (two) times daily as needed for anxiety or itching. HOLD WHEN OUT OF COMMUNITY FOR DIALYSIS TX ON Monday, Wednesday & FRIDAY   insulin aspart (NOVOLOG) 100 UNIT/ML injection Inject 0-5 Units into the skin at bedtime. (Patient taking differently: Inject 3-9 Units into the skin 4 (four) times daily -  before meals and at bedtime. Per sliding scale: 70-100= 3 units, 101-150= 4 units, 151-200= 5 units, 201-250= 6 units, 251-300= 7 units, 310-350= 8 units, 351-400= 9 units, if greater than 400 units call 911)   Lancets (ONETOUCH DELICA PLUS [id number]) MISC 1 each by Other route daily.    melatonin 5 MG TABS Take 5 mg by mouth at bedtime.   midodrine (PROAMATINE) 10 MG tablet Take 20 mg by mouth See admin instructions. Give 20 mg on Mon, Wed, Fri prior to Dialysis treatment and 1 tablet half way through treatment   mirtazapine (REMERON) 15 MG tablet Take 15 mg by mouth at bedtime.   nystatin [facility]) powder Apply 1 Application topically daily at 12 noon. Apply topically to groin and under breast once daily   ondansetron (ZOFRAN-ODT) 8 MG disintegrating tablet Take 1 tablet (8 mg total) by mouth every 6 (six) hours. Scheduled while on abx and then PRN after abx complete   OXYGEN Inhale 2 L into the lungs as needed  (shortness of breath).   pantoprazole (PROTONIX) 40 MG tablet Take 1 tablet (40 mg total) by mouth 2 (two) times daily. BID for 14 days and then daily   polyethylene glycol (MIRALAX / GLYCOLAX) 17 g packet Take 17 g by mouth daily as needed for mild constipation.   promethazine (PHENERGAN) 12.5 MG suppository Place 1 suppository (12.5 mg total) rectally every 6 (six) hours as needed for nausea or vomiting.   Ropinirole HCl 12 MG TB24 Take 12 mg by mouth daily.   simethicone (MYLICON) 700 MG chewable tablet Chew 125 mg by mouth every 12 (twelve) hours as needed for flatulence.   tamsulosin (FLOMAX) 0.4 MG CAPS capsule Take 1 capsule (0.4 mg total) by mouth daily. (Patient taking differently: Take 0.4 mg by mouth at bedtime.)   No facility-administered encounter medications on file as of 08/30/2022.    REVIEW OF SYSTEMS:  Gen: Denies fever, sweats or chills. No weight loss.  CV: Denies chest pain, palpitations or edema. Resp: Denies cough, shortness of breath of hemoptysis.  GI: See HPI.  GU : Denies urinary burning, blood in urine, increased urinary frequency or incontinence. MS: Denies joint pain, muscles aches or weakness. Derm: Denies rash, itchiness, skin lesions or unhealing ulcers. Psych: + Anxiety and depression.  Heme: Denies bruising, easy bleeding. Neuro:  Denies headaches, dizziness or paresthesias. Endo:  + DM type II.   PHYSICAL EXAM: Ht 5' (1.524 m)   Wt 156 lb (70.8 kg)   BMI 30.47 kg/m  Wt Readings from Last 3 Encounters:  08/30/22 156 lb (70.8 kg)  07/26/22 150 lb 9.2 oz (68.3 kg)  06/21/22 163 lb (73.9 kg)    General: Pleasant 76 year old female in NAD.  Head: Normocephalic and atraumatic. Eyes:  Sclerae non-icteric, conjunctive pink. Ears: Normal auditory  acuity. Mouth: Dentition intact. No ulcers or lesions.  Neck: Supple, no lymphadenopathy or thyromegaly.  Lungs: Clear bilaterally to auscultation without wheezes, crackles or rhonchi. Heart: Regular rate  and rhythm. Murmur/bruit from fistula, no rub or gallop appreciated.  Abdomen: Soft, nontender, non distended. No masses. No hepatosplenomegaly. Normoactive bowel sounds x 4 quadrants.  Rectal: Deferred. RUE fistula with + bruit and thrill.  Musculoskeletal: Symmetrical with no gross deformities. Skin: Warm and dry. No rash or lesions on visible extremities. Extremities: No edema. Neurological: Alert oriented x 4, no focal deficits.  Psychological:  Alert and cooperative. Normal mood and affect.  ASSESSMENT AND PLAN:  15) 76 year old female who was admitted to the hospital 10/12 - 07/28/2022 with N/V and food aversion without abdominal pain who was diagnosed with diverticulitis with an abscess per CT treated with antibiotics. EGD was considered during this hospitalization but was cancelled as patient ate solid food prior to procedure time. Her N/V symptoms improved on Zofran and after she received antibiotics for diverticulitis/abscess. Never had a screening colonoscopy.  -Repeat CTAP on non dialysis day to assess for resolution of diverticulitis/abscess.  Continue Pantoprazole 61m po bid -Patient to contact office if N/V recurs  -Request copy of most recent labs from dialysis center  -Await CTAP results prior to pursuing any endoscopic evaluation.  -Dr. SFuller Planto further verify if a future EGD warranted and if a colonoscopy should be pursued 2 months post diverticulitis.  2) ESRD on HD every Mon-Wed-Fri   3) Chronic anemia secondary to ESRD, chronic disease  Request most recent CBC from dialysis center    CC:  Thigpen, JDelman Cheadle *

## 2022-09-04 NOTE — Progress Notes (Signed)
Remo Lipps, pls contact patient and schedule her for CTAP with oral and IV contrast to be scheduled on a Tues or Thursday which are nondialysis days. CTAP should be scheduled in the next week or two. Pls let me know when scheduled. THX  Also, schedule her for a colonoscopy at [facility] with Dr. Fuller Plan. She has ESRD on hemodialysis therefore her colonoscopy needs to be done at [facility] and schedule no earlier than the end of December 2023.

## 2022-09-04 NOTE — Progress Notes (Signed)
Agree with a follow up CT AP and colonoscopy. Since her UGI symptoms resolved we do not need to pursue an EGD at this time.

## 2022-09-05 ENCOUNTER — Other Ambulatory Visit: Payer: Self-pay

## 2022-09-05 ENCOUNTER — Telehealth: Payer: Self-pay

## 2022-09-05 DIAGNOSIS — K5792 Diverticulitis of intestine, part unspecified, without perforation or abscess without bleeding: Secondary | ICD-10-CM

## 2022-09-05 DIAGNOSIS — R112 Nausea with vomiting, unspecified: Secondary | ICD-10-CM

## 2022-09-05 NOTE — Telephone Encounter (Unsigned)
Pt has been ordered and scheduled for CTAP with oral and IV contrast on 09/15/2022 at 3:30 PM: Pt to arrive at 1:30 PM to drink oral contrast. At [facility]: NOP 4 hours prior:   Pt needs labs prior to CT due to pt being Diabetic and Hypertension: Order for lab placed in Epic  Left message for pt to call back

## 2022-09-05 NOTE — Telephone Encounter (Unsigned)
Remo Lipps, pls contact patient and schedule her for CTAP with oral and IV contrast to be scheduled on a Tues or Thursday which are nondialysis days. CTAP should be scheduled in the next week or two. Pls let me know when scheduled. THX   Also, schedule her for a colonoscopy at [facility] with Dr. Fuller Plan. She has ESRD on hemodialysis therefore her colonoscopy needs to be done at [facility] and schedule no earlier than the end of December 2023.

## 2022-09-06 NOTE — Telephone Encounter (Signed)
Pt made aware of Carl Best NP recommendations:  Pt has been ordered and scheduled for CTAP with oral and IV contrast on 09/15/2022 at 3:30 PM: Pt to arrive at 1:30 PM to drink oral contrast. At [facility]: Nothing to eat or drink  4 hours prior: Pt made aware  Pt needs labs prior to CT due to pt being Diabetic and Hypertension: Order for lab placed in Epic: Pt made aware. Pt notified to come on 09/13/2022 to our lab: Location provided:  Pt verbalized understanding with all questions answered.

## 2022-09-08 NOTE — Telephone Encounter (Signed)
Jean Mcclain, the patient has end-stage renal disease and is on hemodialysis therefore it is  typically okay for her to have CTAP with contrast.  However, I would appreciate if you would contact her nephrologist to verify if they are okay if she receives IV contrast. Thank you

## 2022-09-09 NOTE — Telephone Encounter (Signed)
Pt nephrologist office contacted: Dr. Carolin Sicks [phone] and received fax number [phone]: Fax was sent to Dr Carolin Sicks to   verify if they are okay if she receives IV contrast.

## 2022-09-13 ENCOUNTER — Other Ambulatory Visit (INDEPENDENT_AMBULATORY_CARE_PROVIDER_SITE_OTHER): Payer: PPO

## 2022-09-13 DIAGNOSIS — R112 Nausea with vomiting, unspecified: Secondary | ICD-10-CM

## 2022-09-13 DIAGNOSIS — K5792 Diverticulitis of intestine, part unspecified, without perforation or abscess without bleeding: Secondary | ICD-10-CM

## 2022-09-13 LAB — BASIC METABOLIC PANEL
BUN: 12 mg/dL (ref 6–23)
CO2: 40 mEq/L — ABNORMAL HIGH (ref 19–32)
Calcium: 8.5 mg/dL (ref 8.4–10.5)
Chloride: 94 mEq/L — ABNORMAL LOW (ref 96–112)
Creatinine, Ser: 2.93 mg/dL — ABNORMAL HIGH (ref 0.40–1.20)
GFR: 15.05 mL/min — ABNORMAL LOW (ref >60.00)
Glucose, Bld: 111 mg/dL — ABNORMAL HIGH (ref 70–99)
Potassium: 3.9 mEq/L (ref 3.5–5.1)
Sodium: 140 mEq/L (ref 135–145)

## 2022-09-15 ENCOUNTER — Ambulatory Visit (HOSPITAL_COMMUNITY)
Admission: RE | Admit: 2022-09-15 | Discharge: 2022-09-15 | Disposition: A | Discharge status: Home / Self Care | Payer: PPO | Source: Ambulatory Visit | Attending: Student | Admitting: Student

## 2022-09-15 DIAGNOSIS — R112 Nausea with vomiting, unspecified: Secondary | ICD-10-CM | POA: Insufficient documentation

## 2022-09-15 DIAGNOSIS — K5792 Diverticulitis of intestine, part unspecified, without perforation or abscess without bleeding: Secondary | ICD-10-CM | POA: Insufficient documentation

## 2022-09-15 MED ORDER — IOHEXOL 9 MG/ML PO SOLN: 500.0000 mL | ORAL | Status: AC

## 2022-09-15 MED ORDER — IOHEXOL 300 MG/ML  SOLN
75.0000 mL | Freq: Once | INTRAMUSCULAR | Status: AC | PRN
  Administered 2022-09-15: 75 mL via INTRAVENOUS

## 2022-09-15 MED ORDER — IOHEXOL 9 MG/ML PO SOLN
ORAL | Status: AC
  Filled 2022-09-15: qty 1000

## 2022-09-15 NOTE — Telephone Encounter (Signed)
Dr. Carolin Sicks office contacted: Dr. Carolin Sicks stated that as long as the pt does not have an allergy to the IV contrast then it is safe to proceed:

## 2022-09-18 ENCOUNTER — Other Ambulatory Visit: Payer: Self-pay | Admitting: Nurse Practitioner

## 2022-09-18 MED ORDER — AMOXICILLIN-POT CLAVULANATE 500-125 MG PO TABS: 1.0000 | ORAL_TABLET | Freq: Two times a day (BID) | ORAL | 0 refills | Status: DC

## 2022-09-19 ENCOUNTER — Other Ambulatory Visit: Payer: Self-pay

## 2022-09-19 DIAGNOSIS — K5792 Diverticulitis of intestine, part unspecified, without perforation or abscess without bleeding: Secondary | ICD-10-CM

## 2022-09-19 DIAGNOSIS — R935 Abnormal findings on diagnostic imaging of other abdominal regions, including retroperitoneum: Secondary | ICD-10-CM

## 2022-09-19 DIAGNOSIS — R112 Nausea with vomiting, unspecified: Secondary | ICD-10-CM

## 2022-09-19 DIAGNOSIS — R109 Unspecified abdominal pain: Secondary | ICD-10-CM

## 2022-09-19 MED ORDER — AMOXICILLIN-POT CLAVULANATE 500-125 MG PO TABS: 1.0000 | ORAL_TABLET | Freq: Two times a day (BID) | ORAL | 0 refills | Status: DC

## 2022-09-19 MED ORDER — NA SULFATE-K SULFATE-MG SULF 17.5-3.13-1.6 GM/177ML PO SOLN: ORAL | 0 refills | Status: DC

## 2022-09-29 ENCOUNTER — Telehealth: Payer: Self-pay | Admitting: Gastroenterology

## 2022-09-29 ENCOUNTER — Other Ambulatory Visit: Payer: Self-pay

## 2022-09-29 ENCOUNTER — Emergency Department (HOSPITAL_COMMUNITY)
Admission: EM | Admit: 2022-09-29 | Discharge: 2022-09-30 | Disposition: A | Discharge status: Skilled Nursing Facility | Payer: PPO | Attending: Emergency Medicine | Admitting: Emergency Medicine

## 2022-09-29 ENCOUNTER — Emergency Department (HOSPITAL_COMMUNITY): Payer: PPO

## 2022-09-29 ENCOUNTER — Encounter (HOSPITAL_COMMUNITY): Payer: Self-pay | Admitting: Emergency Medicine

## 2022-09-29 DIAGNOSIS — R110 Nausea: Secondary | ICD-10-CM

## 2022-09-29 DIAGNOSIS — N186 End stage renal disease: Secondary | ICD-10-CM | POA: Diagnosis not present

## 2022-09-29 DIAGNOSIS — I120 Hypertensive chronic kidney disease with stage 5 chronic kidney disease or end stage renal disease: Secondary | ICD-10-CM | POA: Diagnosis not present

## 2022-09-29 DIAGNOSIS — Z992 Dependence on renal dialysis: Secondary | ICD-10-CM | POA: Diagnosis not present

## 2022-09-29 DIAGNOSIS — E1122 Type 2 diabetes mellitus with diabetic chronic kidney disease: Secondary | ICD-10-CM | POA: Diagnosis not present

## 2022-09-29 DIAGNOSIS — K5732 Diverticulitis of large intestine without perforation or abscess without bleeding: Secondary | ICD-10-CM | POA: Diagnosis not present

## 2022-09-29 DIAGNOSIS — Z794 Long term (current) use of insulin: Secondary | ICD-10-CM | POA: Diagnosis not present

## 2022-09-29 DIAGNOSIS — K5792 Diverticulitis of intestine, part unspecified, without perforation or abscess without bleeding: Secondary | ICD-10-CM

## 2022-09-29 LAB — CBC WITH DIFFERENTIAL/PLATELET
Abs Immature Granulocytes: 0.03 10*3/uL (ref 0.00–0.07)
Basophils Absolute: 0 10*3/uL (ref 0.0–0.1)
Basophils Relative: 1 %
Eosinophils Absolute: 0 10*3/uL (ref 0.0–0.5)
Eosinophils Relative: 1 %
HCT: 33.7 % — ABNORMAL LOW (ref 36.0–46.0)
Hemoglobin: 10.1 g/dL — ABNORMAL LOW (ref 12.0–15.0)
Immature Granulocytes: 1 %
Lymphocytes Relative: 13 %
Lymphs Abs: 0.7 10*3/uL (ref 0.7–4.0)
MCH: 28 pg (ref 26.0–34.0)
MCHC: 30 g/dL (ref 30.0–36.0)
MCV: 93.4 fL (ref 80.0–100.0)
Monocytes Absolute: 0.3 10*3/uL (ref 0.1–1.0)
Monocytes Relative: 6 %
Neutro Abs: 4.4 10*3/uL (ref 1.7–7.7)
Neutrophils Relative %: 78 %
Platelets: 190 10*3/uL (ref 150–400)
RBC: 3.61 MIL/uL — ABNORMAL LOW (ref 3.87–5.11)
RDW: 19.1 % — ABNORMAL HIGH (ref 11.5–15.5)
WBC: 5.5 10*3/uL (ref 4.0–10.5)
nRBC: 0 % (ref 0.0–0.2)

## 2022-09-29 LAB — TROPONIN I (HIGH SENSITIVITY)
Troponin I (High Sensitivity): 11 ng/L (ref <18)
Troponin I (High Sensitivity): 12 ng/L (ref <18)

## 2022-09-29 LAB — COMPREHENSIVE METABOLIC PANEL
ALT: <5 U/L (ref 0–44)
AST: 7 U/L — ABNORMAL LOW (ref 15–41)
Albumin: 2.9 g/dL — ABNORMAL LOW (ref 3.5–5.0)
Alkaline Phosphatase: 119 U/L (ref 38–126)
Anion gap: 10 (ref 5–15)
BUN: 5 mg/dL — ABNORMAL LOW (ref 8–23)
CO2: 32 mmol/L (ref 22–32)
Calcium: 8.5 mg/dL — ABNORMAL LOW (ref 8.9–10.3)
Chloride: 95 mmol/L — ABNORMAL LOW (ref 98–111)
Creatinine, Ser: 3.18 mg/dL — ABNORMAL HIGH (ref 0.44–1.00)
GFR, Estimated: 15 mL/min — ABNORMAL LOW (ref >60)
Glucose, Bld: 91 mg/dL (ref 70–99)
Potassium: 3.4 mmol/L — ABNORMAL LOW (ref 3.5–5.1)
Sodium: 137 mmol/L (ref 135–145)
Total Bilirubin: 0.7 mg/dL (ref 0.3–1.2)
Total Protein: 5.6 g/dL — ABNORMAL LOW (ref 6.5–8.1)

## 2022-09-29 LAB — LACTIC ACID, PLASMA: Lactic Acid, Venous: 1 mmol/L (ref 0.5–1.9)

## 2022-09-29 LAB — LIPASE, BLOOD: Lipase: 24 U/L (ref 11–51)

## 2022-09-29 MED ORDER — METOCLOPRAMIDE HCL 10 MG PO TABS
10.0000 mg | ORAL_TABLET | Freq: Once | ORAL | Status: AC
  Administered 2022-09-29: 10 mg via ORAL
  Filled 2022-09-29: qty 1

## 2022-09-29 MED ORDER — FENTANYL CITRATE PF 50 MCG/ML IJ SOSY
50.0000 ug | PREFILLED_SYRINGE | Freq: Once | INTRAMUSCULAR | Status: AC
  Administered 2022-09-29: 50 ug via INTRAVENOUS
  Filled 2022-09-29: qty 1

## 2022-09-29 MED ORDER — ROPINIROLE HCL ER 12 MG PO TB24: 12.0000 mg | ORAL_TABLET | Freq: Once | ORAL | Status: DC

## 2022-09-29 MED ORDER — PIPERACILLIN-TAZOBACTAM 3.375 G IVPB 30 MIN
3.3750 g | Freq: Once | INTRAVENOUS | Status: AC
  Administered 2022-09-29: 3.375 g via INTRAVENOUS
  Filled 2022-09-29: qty 50

## 2022-09-29 MED ORDER — METOCLOPRAMIDE HCL 10 MG PO TABS: 10.0000 mg | ORAL_TABLET | Freq: Four times a day (QID) | ORAL | 0 refills | Status: DC

## 2022-09-29 MED ORDER — DIPHENHYDRAMINE HCL 50 MG/ML IJ SOLN
12.5000 mg | Freq: Once | INTRAMUSCULAR | Status: AC
  Administered 2022-09-29: 12.5 mg via INTRAVENOUS
  Filled 2022-09-29: qty 1

## 2022-09-29 MED ORDER — IOHEXOL 350 MG/ML SOLN
75.0000 mL | Freq: Once | INTRAVENOUS | Status: AC | PRN
  Administered 2022-09-29: 75 mL via INTRAVENOUS

## 2022-09-29 MED ORDER — ROPINIROLE HCL ER 4 MG PO TB24
12.0000 mg | ORAL_TABLET | Freq: Once | ORAL | Status: AC
  Administered 2022-09-29: 12 mg via ORAL
  Filled 2022-09-29: qty 3

## 2022-09-29 MED ORDER — ONDANSETRON HCL 4 MG/2ML IJ SOLN: 4.0000 mg | Freq: Once | INTRAMUSCULAR | Status: DC

## 2022-09-29 MED ORDER — METOCLOPRAMIDE HCL 5 MG/ML IJ SOLN
10.0000 mg | Freq: Once | INTRAMUSCULAR | Status: AC
  Administered 2022-09-29: 10 mg via INTRAVENOUS
  Filled 2022-09-29: qty 2

## 2022-09-29 MED ORDER — SODIUM CHLORIDE 0.9 % IV BOLUS
500.0000 mL | Freq: Once | INTRAVENOUS | Status: AC
  Administered 2022-09-29: 500 mL via INTRAVENOUS

## 2022-09-29 NOTE — ED Notes (Signed)
Patient states she would actually like to try to wait until she gets to her facility to take her requip. I let her know if she changes her mind I have it to give to her.

## 2022-09-29 NOTE — ED Notes (Signed)
Patient transported to CT in [facility].

## 2022-09-29 NOTE — Telephone Encounter (Signed)
Patient called states she is having a lot of nausea and diarrhea seeking advise as soon as possible.

## 2022-09-29 NOTE — Telephone Encounter (Signed)
Spoke with patient & she states she has not been able to eat in a few days, and has been experiencing nausea and diarrhea. Currently being treated for diverticulitis & is almost done with her 10 day course of Augmentin. She feels that her symptoms have not improved. Per MD recommendations at the time of her CT 09/15/22, patient was advised to go to ED if no improvements. Patient has been advised to go to ED for further evaluation. She plans to go to [facility]. Will make MD aware.

## 2022-09-29 NOTE — ED Notes (Signed)
Attempted to call report to [place] at this time with no response.

## 2022-09-29 NOTE — ED Triage Notes (Signed)
Pt bib gcems from [place] at [facility] for nausea, dry heaves, and diarrhea for approx 1 week. Pt with hx of diverticulitis - currently on course of antibiotics with colonoscopy scheduled in January. Denies abdominal pain. MWF dialysis - no missed sessions.   BP 118/66, HR 68, Spo2 95% , CBG 106

## 2022-09-29 NOTE — ED Provider Notes (Signed)
[facility] EMERGENCY DEPARTMENT Provider Note   CSN: [id number] Arrival date & time: 09/29/22  1614     History  Chief Complaint  Patient presents with   Nausea    Jean Mcclain is a 76 y.o. female.  She has a past medical history of insulin-dependent type 2 diabetes, ESRD on HD MWF, hypertension, OSA, chronic lymphedema, arthritis, DVT, diverticulitis.  Diagnosed with diverticulitis for the first time a couple of months ago, had outpatient antibiotic therapy and this resolved.  Symptoms have returned over the past couple of weeks, she had a CT done a couple weeks ago and was put on Augmentin and given Zofran.  This time it is not resolving as it was before, she continues to have lots of nausea, food a version.  Notes that she has not anything to eat in about a week.  She has been throwing up a lot especially today, not keeping anything down.  Even in spite of taking Zofran every 6 hours.  She does not have a whole lot of abdominal pain, but notes that there is some in the lower aspect of her abdomen.  She denies any fevers, chills, night sweats.  No chest pain or shortness of breath.  No dysuria.  HPI     Home Medications Prior to Admission medications   Medication Sig Start Date End Date Taking? Authorizing Provider  acetaminophen (TYLENOL) 500 MG tablet Take 2 tablets (1,000 mg total) by mouth 3 (three) times daily. Patient taking differently: Take 1,000 mg by mouth 3 (three) times daily as needed (pain). 01/14/22  Yes Dahal, Marlowe Aschoff, MD  albuterol (VENTOLIN HFA) 108 (90 Base) MCG/ACT inhaler Inhale 2 puffs into the lungs every 6 (six) hours as needed for wheezing or shortness of breath.   Yes [provider]  amoxicillin-clavulanate (AUGMENTIN) 500-125 MG tablet Take 1 tablet by mouth 2 (two) times daily. Patient taking differently: Take 1 tablet by mouth 2 (two) times daily. (09/20/22-09/29/22) 09/19/22  Yes Noralyn Pick, NP  atorvastatin  (LIPITOR) 20 MG tablet Take 20 mg by mouth in the morning.   Yes [provider]  buprenorphine (BUTRANS) 10 MCG/HR Harvey 1 patch onto the skin once a week.   Yes [provider]  carbidopa-levodopa (SINEMET IR) 25-100 MG tablet Take 1-2 tablets by mouth See admin instructions. 2 entries on MAR: 2 tablets every morning (from 0600-1000) + 1 tablet twice daily (from 1200-1400 & 1800-2300)   Yes [provider]  docusate sodium (COLACE) 100 MG capsule Take 200 mg by mouth at bedtime. Hold for loose stools   Yes [provider]  fluticasone (FLONASE) 50 MCG/ACT nasal spray Place 1 spray into both nostrils 2 (two) times daily as needed for allergies.   Yes [provider]  gabapentin (NEURONTIN) 300 MG capsule Take 300 mg by mouth at bedtime.   Yes [provider]  Glycerin, Laxative, (GLYCERIN, ADULT,) 2 g SUPP Place 1 suppository rectally daily as needed for moderate constipation. Patient taking differently: Place 1 suppository rectally daily as needed for moderate constipation or severe constipation. 07/28/22  Yes Vann, Jessica U, DO  hydrOXYzine (ATARAX) 50 MG tablet Take 50 mg by mouth 2 (two) times daily as needed (no indication given on MAR).   Yes [provider]  insulin aspart (NOVOLOG FLEXPEN) 100 UNIT/ML FlexPen Inject 3-9 Units into the skin See admin instructions. Inject 3-9 units before meals and at bedtime per sliding scale: BS 70-100 : 3 units  BS 101-150: 4 units BS 151-200: 5 units BS 201-250: 6 units BS 251-300: 7 units BS 301-350: 8 units BS 351-400: 9 units BS > 400 : call 911 HOLD on M-W-F when at dialysis   Yes [provider]  loperamide (IMODIUM A-D) 2 MG tablet Take 2 mg by mouth 2 (two) times daily as needed for diarrhea or loose stools.   Yes [provider]  melatonin 5 MG TABS Take 5 mg by mouth at bedtime.   Yes [provider]  metoCLOPramide (REGLAN) 10 MG tablet Take 1  tablet (10 mg total) by mouth every 6 (six) hours. 09/29/22  Yes Phyllis Ginger, MD  midodrine (PROAMATINE) 10 MG tablet Take 10-20 mg by mouth See admin instructions. 20 mg every Monday, Wednesday, Friday prior to Hemodialysis and 10 mg half-way through hemodialysis.   Yes [provider]  mirtazapine (REMERON) 15 MG tablet Take 15 mg by mouth at bedtime.   Yes [provider]  Na Sulfate-K Sulfate-Mg Sulf 17.5-3.13-1.6 GM/177ML SOLN Refer to Instruction sent to you for your colonoscopy 09/19/22  Yes Ladene Artist, MD  nystatin [facility]) powder Apply 1 Application topically daily. To groin and under breasts Kept in room, pt self applies   Yes [provider]  ondansetron (ZOFRAN-ODT) 8 MG disintegrating tablet Take 1 tablet (8 mg total) by mouth every 6 (six) hours. Scheduled while on abx and then PRN after abx complete Patient taking differently: Take 8 mg by mouth every 6 (six) hours as needed for nausea or vomiting. 07/28/22  Yes Vann, Jessica U, DO  pantoprazole (PROTONIX) 40 MG tablet Take 1 tablet (40 mg total) by mouth 2 (two) times daily. BID for 14 days and then daily Patient taking differently: Take 40 mg by mouth in the morning. 07/28/22  Yes Vann, Jessica U, DO  polyethylene glycol powder (GLYCOLAX/MIRALAX) 17 GM/SCOOP powder Take 17 g by mouth daily as needed (constipation).   Yes [provider]  pramoxine (SARNA SENSITIVE) 1 % LOTN Apply 1 Application topically daily. Kept in room, pt self applies   Yes [provider]  promethazine (PHENERGAN) 12.5 MG suppository Place 1 suppository (12.5 mg total) rectally every 6 (six) hours as needed for nausea or vomiting. 07/28/22  Yes Eulogio Bear U, DO  Ropinirole HCl 12 MG TB24 Take 12 mg by mouth in the morning.   Yes [provider]  Simethicone (GAS-X EXTRA STRENGTH PO) Take 1 tablet by mouth every 12 (twelve) hours as needed (heartburn, indigestion).   Yes [provider]   tamsulosin (FLOMAX) 0.4 MG CAPS capsule Take 1 capsule (0.4 mg total) by mouth daily. Patient taking differently: Take 0.4 mg by mouth at bedtime. 01/16/22  Yes Dahal, Marlowe Aschoff, MD  doxercalciferol (HECTOROL) 4 MCG/2ML injection Inject 1 mL (2 mcg total) into the vein every Monday, Wednesday, and Friday with hemodialysis. Patient not taking: Reported on 09/29/2022 01/27/20   [place] Donnamarie Poag, DO  insulin aspart (NOVOLOG) 100 UNIT/ML injection Inject 0-5 Units into the skin at bedtime. Patient not taking: Reported on 09/29/2022 01/14/22   Terrilee Croak, MD      Allergies    Ms contin [morphine]    Review of Systems   Review of Systems  Physical Exam Updated Vital Signs BP (!) 127/57 (BP Location: Left Arm)   Pulse 65   Temp 98.7 F (37.1 C) (Oral)   Resp 18   Ht 5' (1.524 m)   Wt 70.8 kg   SpO2 93%  BMI 30.48 kg/m  Physical Exam Vitals and nursing note reviewed.  Constitutional:      General: She is not in acute distress.    Appearance: She is well-developed. She is not ill-appearing or diaphoretic.  HENT:     Head: Normocephalic and atraumatic.     Right Ear: External ear normal.     Left Ear: External ear normal.     Nose: Nose normal. No congestion or rhinorrhea.     Mouth/Throat:     Mouth: Mucous membranes are moist.     Pharynx: Oropharynx is clear.  Eyes:     Conjunctiva/sclera: Conjunctivae normal.  Cardiovascular:     Rate and Rhythm: Normal rate and regular rhythm.     Heart sounds: No murmur heard. Pulmonary:     Effort: Pulmonary effort is normal. No respiratory distress.     Breath sounds: Normal breath sounds.  Abdominal:     General: Abdomen is flat. There is no distension.     Palpations: Abdomen is soft.     Tenderness: There is abdominal tenderness (Diffuse lower abdominal tenderness palpation). There is no guarding or rebound.  Musculoskeletal:        General: No swelling or tenderness.     Cervical back: Neck supple.     Right lower leg: No edema.      Left lower leg: No edema.     Comments: Left lower extremity in brace from prior ankle fracture  Skin:    General: Skin is warm and dry.     Capillary Refill: Capillary refill takes less than 2 seconds.  Neurological:     Mental Status: She is alert.  Psychiatric:        Mood and Affect: Mood normal.     ED Results / Procedures / Treatments   Labs (all labs ordered are listed, but only abnormal results are displayed) Labs Reviewed  CBC WITH DIFFERENTIAL/PLATELET - Abnormal; Notable for the following components:      Result Value   RBC 3.61 (*)    Hemoglobin 10.1 (*)    HCT 33.7 (*)    RDW 19.1 (*)    All other components within normal limits  COMPREHENSIVE METABOLIC PANEL - Abnormal; Notable for the following components:   Potassium 3.4 (*)    Chloride 95 (*)    BUN 5 (*)    Creatinine, Ser 3.18 (*)    Calcium 8.5 (*)    Total Protein 5.6 (*)    Albumin 2.9 (*)    AST 7 (*)    GFR, Estimated 15 (*)    All other components within normal limits  CULTURE, BLOOD (ROUTINE X 2)  CULTURE, BLOOD (ROUTINE X 2)  LIPASE, BLOOD  LACTIC ACID, PLASMA  TROPONIN I (HIGH SENSITIVITY)  TROPONIN I (HIGH SENSITIVITY)    EKG EKG Interpretation  Date/Time:  Thursday September 29 2022 17:17:22 EST Ventricular Rate:  61 PR Interval:    QRS Duration: 94 QT Interval:  482 QTC Calculation: 485 R Axis:   -38 Text Interpretation: Atrial fibrillation Left axis deviation Nonspecific ST and T wave abnormality Prolonged QT Abnormal ECG When compared with ECG of 21-Jul-2022 14:59, No significant change since last tracing Confirmed by Wandra Arthurs [id number]) on 09/29/2022 5:27:16 PM  Radiology CT ABDOMEN PELVIS W CONTRAST  Result Date: 09/29/2022 CLINICAL DATA:  Abdominal pain, recent diverticulitis EXAM: CT ABDOMEN AND PELVIS WITH CONTRAST TECHNIQUE: Multidetector CT imaging of the abdomen and pelvis was performed using the standard protocol following  bolus administration of intravenous  contrast. RADIATION DOSE REDUCTION: This exam was performed according to the departmental dose-optimization program which includes automated exposure control, adjustment of the mA and/or kV according to patient size and/or use of iterative reconstruction technique. CONTRAST:  29m OMNIPAQUE IOHEXOL 350 MG/ML SOLN COMPARISON:  09/15/2022 FINDINGS: Lower chest: Moderate left and small right pleural effusions, mildly progressive. Associated bilateral lower lobe atelectasis. Hepatobiliary: Liver is within normal limits. Gallbladder is unremarkable. No intrahepatic or extrahepatic ductal dilatation. Pancreas: Within normal limits. Spleen: Within normal limits. Adrenals/Urinary Tract: Adrenal glands are within normal limits. Bilateral renal parenchymal atrophy. 2.0 cm anterior interpolar right renal cyst (series 3/image 38), measuring simple fluid density, benign (Bosniak I). No follow-up is recommended. No hydronephrosis. Bladder is within normal limits. Stomach/Bowel: Stomach is within normal limits. No evidence of bowel obstruction. Normal appendix (series 3/image 82). Mild sigmoid diverticulitis (series 3/image 84), unchanged. No drainable fluid collection/abscess. No free air. Vascular/Lymphatic: No evidence of abdominal aortic aneurysm. Atherosclerotic calcifications of the abdominal aorta and branch vessels. No suspicious abdominopelvic lymphadenopathy. Reproductive: Uterus is within normal limits. Stable 3.6 cm simple left ovarian cyst. Chronic 2.2 cm rim calcified lesion along the right ovary/adnexa (series 3/image 61). Other: No abdominopelvic ascites. Musculoskeletal: Degenerative changes of the visualized thoracolumbar spine. IMPRESSION: Mild sigmoid diverticulitis, unchanged. No drainable fluid collection/abscess. No free air. Moderate left and small right pleural effusions, mildly progressive. Associated bilateral lower lobe atelectasis. Additional stable ancillary findings as above. Electronically Signed    By: SJulian HyM.D.   On: 09/29/2022 19:33    Procedures Procedures    Medications Ordered in ED Medications  rOPINIRole (REQUIP XL) 24 hr tablet 12 mg (has no administration in time range)  metoCLOPramide (REGLAN) tablet 10 mg (has no administration in time range)  sodium chloride 0.9 % bolus 500 mL (0 mLs Intravenous Stopped 09/29/22 1904)  piperacillin-tazobactam (ZOSYN) IVPB 3.375 g (0 g Intravenous Stopped 09/29/22 1904)  metoCLOPramide (REGLAN) injection 10 mg (10 mg Intravenous Given 09/29/22 1720)  diphenhydrAMINE (BENADRYL) injection 12.5 mg (12.5 mg Intravenous Given 09/29/22 1718)  fentaNYL (SUBLIMAZE) injection 50 mcg (50 mcg Intravenous Given 09/29/22 1925)  iohexol (OMNIPAQUE) 350 MG/ML injection 75 mL (75 mLs Intravenous Contrast Given 09/29/22 1928)    ED Course/ Medical Decision Making/ A&P                           Medical Decision Making Problems Addressed: Diverticulitis: acute illness or injury that poses a threat to life or bodily functions Nausea: acute illness or injury with systemic symptoms  Amount and/or Complexity of Data Reviewed Independent Historian: EMS Labs: ordered. Decision-making details documented in ED Course. Radiology: ordered and independent interpretation performed. Decision-making details documented in ED Course. ECG/medicine tests: ordered and independent interpretation performed. Decision-making details documented in ED Course.  Risk Prescription drug management. Decision regarding hospitalization.   Patient is a 76year old female with past medical history of insulin-dependent type 2 diabetes, ESRD on HD MWF, hypertension, OSA, chronic lymphedema, arthritis, DVT, diverticulitis.  She has had diverticulitis off and on for the past couple of months, first time she had it antibiotics resolved.  She had return of symptoms about 2 weeks ago when she had a CT which showed sigmoid diverticulitis with gas tracking on the posterior wall  concerning for microperforation or small pericolic abscess.  This was obtained in the outpatient setting.  At that time she was prescribed Augmentin.  She has been taking the  Augmentin over the past 7 days, over the past 7 days she has had significant nausea and vomiting and inability to tolerate p.o.  States that she has not had anything to eat over the past week and feels weak.  On presentation today, vital signs are stable and relatively within normal limits.  On exam she has tenderness to the lower abdomen, but not significant and no signs of rebound or guarding.  Initial differential concerning for diverticulitis, intra-abdominal abscess, microperforation, pancreatitis, pain is noticed, ACS/MI, electrolyte abnormality.  Initial workup to include CBC, CMP, blood culture, lipase, troponin, lactic acid.  Imaging evaluation of CT of the abdomen pelvis with contrast, due to concerns for perforation or intra-abdominal abscess will use IV contrast, acknowledging the risk that this may pose to her kidneys given that she is ESRD on dialysis.  From a symptomatic management standpoint, will give 10 mg IV Reglan as she has been using Zofran at home without any effect, additionally 12.5 mg of IV Benadryl, 500 mL bolus of normal saline, 3.375 g of IV Zosyn, and 50 mcg of fentanyl for pain control.  On reassessment, the patient is doing much better, she is tolerating p.o., feels like the Reglan helped a lot.  Labs per my review showed no leukocytosis with a white blood cell count of 5500, stable anemia, normal lipase, normal troponin, normal lactic acid.  CT per my review shows actually improved findings from her prior diverticulitis, without any evidence of abscess or perforation.  Discussed this with the patient, encouraging that she has improved, and do wonder if some of the symptoms that she is having is related to the antibiotic that she is on.  Additionally, she had significant improvement after administration  of Reglan, will prescribe Reglan to discharge and use instead of Zofran as it seems to be having more positive effect on her.  She started to have some symptoms of restless leg syndrome which she has at home, will give her ropinirole prior to discharge.  But at this time based on her labs and imaging studies, normal lactic acid, believe that she is stable for discharge with outpatient workup.  Encouraged her to follow-up with her primary care provider as well as her nephrology doctors for continued dialysis as her CT did show a little bit of evidence of pulmonary edema and pleural effusions.  She is scheduled for dialysis tomorrow.  Final Clinical Impression(s) / ED Diagnoses Final diagnoses:  Nausea  Diverticulitis    Rx / DC Orders ED Discharge Orders          Ordered    metoCLOPramide (REGLAN) 10 MG tablet  Every 6 hours        09/29/22 2030              Phyllis Ginger, MD 09/29/22 2102    Drenda Freeze, MD 09/29/22 2320

## 2022-09-29 NOTE — Telephone Encounter (Signed)
Since she hasn't improved and she is unable to eat recommend ED evaluation.

## 2022-09-29 NOTE — ED Notes (Signed)
Patient wants

## 2022-09-30 MED ORDER — OXYCODONE-ACETAMINOPHEN 5-325 MG PO TABS
1.0000 | ORAL_TABLET | Freq: Once | ORAL | Status: DC
  Filled 2022-09-30: qty 1

## 2022-09-30 NOTE — ED Notes (Signed)
Trying to make patient as comfortable as possible while waiting for PTAR. Repositioned patient again at this time.

## 2022-10-04 LAB — CULTURE, BLOOD (ROUTINE X 2)
Culture: NO GROWTH
Culture: NO GROWTH
Special Requests: ADEQUATE
Special Requests: ADEQUATE

## 2022-10-11 ENCOUNTER — Encounter: Payer: Self-pay | Admitting: Podiatry

## 2022-10-11 ENCOUNTER — Ambulatory Visit (INDEPENDENT_AMBULATORY_CARE_PROVIDER_SITE_OTHER): Payer: PPO | Admitting: Podiatry

## 2022-10-11 DIAGNOSIS — B351 Tinea unguium: Secondary | ICD-10-CM | POA: Diagnosis not present

## 2022-10-11 DIAGNOSIS — M79676 Pain in unspecified toe(s): Secondary | ICD-10-CM

## 2022-10-11 DIAGNOSIS — E0842 Diabetes mellitus due to underlying condition with diabetic polyneuropathy: Secondary | ICD-10-CM | POA: Diagnosis not present

## 2022-10-11 NOTE — Progress Notes (Signed)
  Subjective:  Patient ID: Jean Mcclain, female    DOB: 12-19-1945,   MRN: [id number]  Chief Complaint  Patient presents with   Nail Problem    [facility] BS-116 A1C-7.0    77 y.o. female presents for concern of thickened elongated and painful nails that are difficult to trim. Requesting to have them trimmed today. Relates burning and tingling in their feet. Patient is diabetic and last A1c was  Lab Results  Component Value Date   HGBA1C 6.9 (H) 07/22/2022   .   PCP:  Pcp, No    . Denies any other pedal complaints. Denies n/v/f/c.   Past Medical History:  Diagnosis Date   Acute and chronic respiratory failure (acute-on-chronic) (HCC)    Acute deep vein thrombosis (DVT) of popliteal vein of left lower extremity ([place]) 03/11/2017   Anxiety    Arthritis    osteo   Diabetes mellitus, type 2 ([place])    Type II   DVT (deep venous thrombosis) ([place]) 03/2017   left popliteal   ESRD on hemodialysis (HCC)    Facet joint disease    Facet joint disease    foot   Fibula fracture 02/2017   left   GERD (gastroesophageal reflux disease)    H/O blood clots    Hyperlipidemia    Hypertension    Macrocytic anemia    Morbid obesity (HCC)    OSA (obstructive sleep apnea)    does not use CPAP    Osteopenia    Renal disorder    MWF dialysis   Shortness of breath    with exertion   Supplemental oxygen dependent    Venous stasis ulcers (HCC)    Vitamin D deficiency     Objective:  Physical Exam: Vascular: DP/PT pulses 2/4 bilateral. CFT <3 seconds. Absent hair growth on digits. Edema noted to bilateral lower extremities. Xerosis noted bilaterally.  Skin. No lacerations or abrasions bilateral feet. Nails 1-5 bilateral  are thickened discolored and elongated with subungual debris.  Musculoskeletal: MMT 5/5 bilateral lower extremities in DF, PF, Inversion and Eversion. Deceased ROM in DF of ankle joint.  Neurological: Sensation intact to light touch. Protective sensation diminished bilateral.     Assessment:   1. Pain due to onychomycosis of toenail   2. Diabetes mellitus due to underlying condition with diabetic polyneuropathy, unspecified whether long term insulin use ([place])      Plan:  Patient was evaluated and treated and all questions answered. -Discussed and educated patient on diabetic foot care, especially with  regards to the vascular, neurological and musculoskeletal systems.  -Stressed the importance of good glycemic control and the detriment of not  controlling glucose levels in relation to the foot. -Discussed supportive shoes at all times and checking feet regularly.  -Mechanically debrided all nails 1-5 bilateral using sterile nail nipper and filed with dremel without incident  -Answered all patient questions -Patient to return  in 3 months for at risk foot care -Patient advised to call the office if any problems or questions arise in the meantime.   Lorenda Peck, DPM

## 2022-10-18 ENCOUNTER — Non-Acute Institutional Stay: Payer: HMO | Admitting: Family Medicine

## 2022-10-18 ENCOUNTER — Encounter: Payer: Self-pay | Admitting: Family Medicine

## 2022-10-18 VITALS — BP 124/60 | HR 72 | Temp 98.2°F | Resp 20

## 2022-10-18 DIAGNOSIS — R112 Nausea with vomiting, unspecified: Secondary | ICD-10-CM

## 2022-10-18 DIAGNOSIS — E441 Mild protein-calorie malnutrition: Secondary | ICD-10-CM

## 2022-10-18 DIAGNOSIS — Z515 Encounter for palliative care: Secondary | ICD-10-CM | POA: Insufficient documentation

## 2022-10-18 DIAGNOSIS — J9621 Acute and chronic respiratory failure with hypoxia: Secondary | ICD-10-CM

## 2022-10-18 NOTE — Progress Notes (Signed)
[facility] Consult Note Telephone: [phone]  Fax: [phone]    Date of encounter: 10/18/22 2:17 PM PATIENT NAME: Jean Mcclain [address] [place] [id number]   [phone] (home)  DOB: 1946-01-15 MRN: [id number] PRIMARY CARE PROVIDER:    Pcp, No,  No address on file None  REFERRING PROVIDER:   No referring provider defined for this encounter. N/A  RESPONSIBLE PARTY:    Contact Information     Name Relation Home Work Mobile   Gold,Bernard Brother [phone]     Kandice Hams Friend   [phone]        I met face to face with patient in her assisted living facility. Palliative Care was asked to follow this patient by consultation request of Rolena Infante Provider to address advance care planning and complex medical decision making. This is a follow up visit   ASSESSMENT , SYMPTOM MANAGEMENT AND PLAN / RECOMMENDATIONS:  Nausea and vomiting Improved results with use of Reglan and has been receiving TID. May help with nausea if coming from Sinemet. Try for small frequent protein intake Q 2-3 hours during the day.  Acute on chronic respiratory failure Recommend titration of O2 to maintain O2 sats between 90-92%. Likely viral illness, monitor for worsening with development of fever, tachycardia and tachypne  Mild protein calorie malnutrition Weight loss 20 lbs since June 2023 Albumin has been low but stable at 2.8-2.9 for 2-3 months. Continue to encourage renal supplements as pt able to continue  Palliative Care Encounter  Follow up in next 1-2 weeks with pt and brother who is HC POA to clarify new MOST given DNR status.     Advance Care Planning/Goals of Care: Goals include to maximize quality of life and symptom management. Identification of a healthcare agent-brother CODE STATUS: DNR as of 01/14/22      Follow up Palliative Care Visit: Palliative care will continue to follow for  complex medical decision making, advance care planning, and clarification of goals. Return 4 weeks or prn.    This visit was coded based on medical decision making (MDM).  PPS: 40%  HOSPICE ELIGIBILITY/DIAGNOSIS: TBD  Chief Complaint:  Palliative Care is following for chronic management in setting of ESRD on hemodialysis.  Palliative Care is also following to assist with review/refining goals of care.  HISTORY OF PRESENT ILLNESS:  CHANEKA TREFZ is a 77 y.o. year old female with chronic respiratory failure with hypoxia and hypercapnia, OSA, HTN, GERD, obstipation, DM type 2 insulin dependent, secondary hyperparathyroidism of renal origin, OA of left leg, thoracic spine fracture, pressure injury of buttocks, ESRD on hemodialysis, anemia of CKD, lymphedema, bilateral shoulder and knee pain, gout, hx of DVT in LLE and recent hospitalization/ED visit for diverticulitis with possible microperforation and abscess per CT. However most recent CT abdomen done in December 2023 showed improvement with normal lactic acid and WBC.  Pt c/o not being able to eat, gagging, nausea and postnasal drip.  Has had 2 episodes of diverticulitis. Takes Midodrine to boost BP for HD. Facility nurse checked Covid 19 and was negative today, blood sugar 102 and did not receive am insulin.  She didn't eat or take meds today.  Reports 20 lb weight loss with recent diverticulitis and abscess.  Denies generalized pain/abd pain or blood in emesis that she had yesterday.  States feeling bad since yesterday or the day prior.  Does not want to go to ER currently, O2 sats improved  with addition of O2.  Is supposed to follow up for colonoscopy towards the end of the month.  She states just "feels bad".  Facility performed rapid Covid test which was negative per Carolyn/Rebekah.    History obtained from review of EMR, speaking with facility nurse, Eugene Garnet and resident director Hoyle Sauer and discussion with Ms. Maye.      Latest Ref Rng &  Units 09/29/2022    4:55 PM 07/27/2022    6:30 AM 07/26/2022    4:35 AM  CBC  WBC 4.0 - 10.5 K/uL 5.5  5.1  7.3   Hemoglobin 12.0 - 15.0 g/dL 10.1  8.2  9.1   Hematocrit 36.0 - 46.0 % 33.7  26.0  28.8   Platelets 150 - 400 K/uL 190  139  176       Latest Ref Rng & Units 09/29/2022    4:55 PM 09/13/2022   12:24 PM 07/27/2022    3:01 AM  CMP  Glucose 70 - 99 mg/dL 91  111  92   BUN 8 - 23 mg/dL '5  12  14   '$ Creatinine 0.44 - 1.00 mg/dL 3.18  2.93  3.18   Sodium 135 - 145 mmol/L 137  140  136   Potassium 3.5 - 5.1 mmol/L 3.4  3.9  3.6   Chloride 98 - 111 mmol/L 95  94  101   CO2 22 - 32 mmol/L 32  40  23   Calcium 8.9 - 10.3 mg/dL 8.5  8.5  8.2   Total Protein 6.5 - 8.1 g/dL 5.6     Total Bilirubin 0.3 - 1.2 mg/dL 0.7     Alkaline Phos 38 - 126 U/L 119     AST 15 - 41 U/L 7     ALT 0 - 44 U/L <5         Latest Ref Rng & Units 09/29/2022    4:55 PM 07/21/2022    3:38 PM 01/12/2022    3:41 AM  Hepatic Function  Total Protein 6.5 - 8.1 g/dL 5.6  5.6    Albumin 3.5 - 5.0 g/dL 2.9  2.9  2.8   AST 15 - 41 U/L 7  9    ALT 0 - 44 U/L <5  <5    Alk Phosphatase 38 - 126 U/L 119  102    Total Bilirubin 0.3 - 1.2 mg/dL 0.7  0.5      I reviewed EMR for available labs, medications, imaging, studies and related documents.     ROS General: NAD, "feel bad" Cardiovascular: denies chest pain Pulmonary: denies cough, denies  SOB Abdomen: endorses food aversion and inability to eat for the last day or so, denies constipation, endorses continence of bowel GU: ESRD on hemodialysis MSK:  endorses increased weakness, no falls reported Skin:  No visible wounds Neurological: endorses pain in buttocks, denies insomnia Psych: Endorses depressed mood Heme/lymph/immuno: denies abnormal bleeding  Physical Exam: Current and past weights: 163 lbs week of 06/16/22 at hemodialysis, 156 lbs 1.4 oz 09/29/22, weight 02/11/22 was 177 lbs 5 oz Constitutional: Appears pale and chronically ill General: frail  appearing, ENMT: intact hearing, nasal congestion noted CV: S1S2, RRR with LUSB holosystolic murmur, 1 + ankle edema of left foot Pulmonary: CTAB, no increased work of breathing, no cough, placed on 2.5 L [place] Abdomen:  normo-active BS + 4 quadrants, soft and non tender MSK: no sarcopenia, moves all extremities, uses power WC for mobility Skin: warm and dry, no rashes  or wounds on visible skin. Has freestyle libre transmitted on upper left arm Neuro:   no cognitive impairment Psych: mildly-anxious affect, A and O x 3 Hem/lymph/immuno: no widespread bruising   Thank you for the opportunity to participate in the care of Ms. Twitty.  The palliative care team will continue to follow. Please call our office at [phone] if we can be of additional assistance.   Marijo Conception, FNP -C  COVID-19 PATIENT SCREENING TOOL Asked and negative response unless otherwise noted:   Have you had symptoms of covid, tested positive or been in contact with someone with symptoms/positive test in the past 5-10 days?  No

## 2022-10-19 ENCOUNTER — Encounter (HOSPITAL_COMMUNITY): Payer: Self-pay

## 2022-10-19 ENCOUNTER — Other Ambulatory Visit: Payer: Self-pay

## 2022-10-19 ENCOUNTER — Emergency Department (HOSPITAL_COMMUNITY): Payer: PPO

## 2022-10-19 ENCOUNTER — Emergency Department (HOSPITAL_COMMUNITY)
Admission: EM | Admit: 2022-10-19 | Discharge: 2022-10-20 | Disposition: A | Discharge status: Home / Self Care | Payer: PPO | Attending: Emergency Medicine | Admitting: Emergency Medicine

## 2022-10-19 DIAGNOSIS — R0609 Other forms of dyspnea: Secondary | ICD-10-CM | POA: Insufficient documentation

## 2022-10-19 DIAGNOSIS — K5732 Diverticulitis of large intestine without perforation or abscess without bleeding: Secondary | ICD-10-CM | POA: Insufficient documentation

## 2022-10-19 DIAGNOSIS — Z794 Long term (current) use of insulin: Secondary | ICD-10-CM | POA: Diagnosis not present

## 2022-10-19 DIAGNOSIS — Z992 Dependence on renal dialysis: Secondary | ICD-10-CM | POA: Diagnosis not present

## 2022-10-19 DIAGNOSIS — Z20822 Contact with and (suspected) exposure to covid-19: Secondary | ICD-10-CM | POA: Insufficient documentation

## 2022-10-19 DIAGNOSIS — R110 Nausea: Secondary | ICD-10-CM

## 2022-10-19 DIAGNOSIS — R112 Nausea with vomiting, unspecified: Secondary | ICD-10-CM | POA: Diagnosis present

## 2022-10-19 DIAGNOSIS — N186 End stage renal disease: Secondary | ICD-10-CM

## 2022-10-19 DIAGNOSIS — I120 Hypertensive chronic kidney disease with stage 5 chronic kidney disease or end stage renal disease: Secondary | ICD-10-CM | POA: Diagnosis not present

## 2022-10-19 DIAGNOSIS — Z79899 Other long term (current) drug therapy: Secondary | ICD-10-CM | POA: Insufficient documentation

## 2022-10-19 DIAGNOSIS — E1122 Type 2 diabetes mellitus with diabetic chronic kidney disease: Secondary | ICD-10-CM | POA: Insufficient documentation

## 2022-10-19 DIAGNOSIS — G20C Parkinsonism, unspecified: Secondary | ICD-10-CM | POA: Insufficient documentation

## 2022-10-19 DIAGNOSIS — K5792 Diverticulitis of intestine, part unspecified, without perforation or abscess without bleeding: Secondary | ICD-10-CM

## 2022-10-19 LAB — CBC WITH DIFFERENTIAL/PLATELET
Abs Immature Granulocytes: 0.04 10*3/uL (ref 0.00–0.07)
Basophils Absolute: 0 10*3/uL (ref 0.0–0.1)
Basophils Relative: 0 %
Eosinophils Absolute: 0 10*3/uL (ref 0.0–0.5)
Eosinophils Relative: 0 %
HCT: 29.7 % — ABNORMAL LOW (ref 36.0–46.0)
Hemoglobin: 9 g/dL — ABNORMAL LOW (ref 12.0–15.0)
Immature Granulocytes: 1 %
Lymphocytes Relative: 4 %
Lymphs Abs: 0.3 10*3/uL — ABNORMAL LOW (ref 0.7–4.0)
MCH: 27.7 pg (ref 26.0–34.0)
MCHC: 30.3 g/dL (ref 30.0–36.0)
MCV: 91.4 fL (ref 80.0–100.0)
Monocytes Absolute: 0.2 10*3/uL (ref 0.1–1.0)
Monocytes Relative: 3 %
Neutro Abs: 7.5 10*3/uL (ref 1.7–7.7)
Neutrophils Relative %: 92 %
Platelets: 165 10*3/uL (ref 150–400)
RBC: 3.25 MIL/uL — ABNORMAL LOW (ref 3.87–5.11)
RDW: 19.4 % — ABNORMAL HIGH (ref 11.5–15.5)
WBC: 8.1 10*3/uL (ref 4.0–10.5)
nRBC: 0 % (ref 0.0–0.2)

## 2022-10-19 LAB — COMPREHENSIVE METABOLIC PANEL
ALT: <5 U/L (ref 0–44)
AST: 6 U/L — ABNORMAL LOW (ref 15–41)
Albumin: 3.2 g/dL — ABNORMAL LOW (ref 3.5–5.0)
Alkaline Phosphatase: 120 U/L (ref 38–126)
Anion gap: 10 (ref 5–15)
BUN: 10 mg/dL (ref 8–23)
CO2: 30 mmol/L (ref 22–32)
Calcium: 8.5 mg/dL — ABNORMAL LOW (ref 8.9–10.3)
Chloride: 97 mmol/L — ABNORMAL LOW (ref 98–111)
Creatinine, Ser: 2.03 mg/dL — ABNORMAL HIGH (ref 0.44–1.00)
GFR, Estimated: 25 mL/min — ABNORMAL LOW (ref >60)
Glucose, Bld: 96 mg/dL (ref 70–99)
Potassium: 4.1 mmol/L (ref 3.5–5.1)
Sodium: 137 mmol/L (ref 135–145)
Total Bilirubin: 0.7 mg/dL (ref 0.3–1.2)
Total Protein: 6 g/dL — ABNORMAL LOW (ref 6.5–8.1)

## 2022-10-19 LAB — RESP PANEL BY RT-PCR (RSV, FLU A&B, COVID)  RVPGX2
Influenza A by PCR: NEGATIVE
Influenza B by PCR: NEGATIVE
Resp Syncytial Virus by PCR: NEGATIVE
SARS Coronavirus 2 by RT PCR: NEGATIVE

## 2022-10-19 LAB — LIPASE, BLOOD: Lipase: 30 U/L (ref 11–51)

## 2022-10-19 LAB — TROPONIN I (HIGH SENSITIVITY): Troponin I (High Sensitivity): 14 ng/L (ref <18)

## 2022-10-19 LAB — BRAIN NATRIURETIC PEPTIDE: B Natriuretic Peptide: 929.4 pg/mL — ABNORMAL HIGH (ref 0.0–100.0)

## 2022-10-19 MED ORDER — ONDANSETRON 4 MG PO TBDP
4.0000 mg | ORAL_TABLET | Freq: Once | ORAL | Status: AC
  Administered 2022-10-19: 4 mg via ORAL
  Filled 2022-10-19: qty 1

## 2022-10-19 NOTE — ED Provider Triage Note (Signed)
Emergency Medicine Provider Triage Evaluation Note  Jean Mcclain , a 77 y.o. female  was evaluated in triage.  Pt complains of nausea and vomiting that has been persistent for the past few weeks.  Associated with abdominal pain.  Recently diagnosed with diverticulitis.  Last CT scan was 12/21 which demonstrated mild sigmoid diverticulitis.  No abscess.  No free air.  Patient also admits to shortness of breath over the past few days.  She has been having to use her oxygen at home during the day.  Review of Systems  Positive: SOB Negative: fever  Physical Exam  BP (!) 149/58 (BP Location: Right Arm)   Pulse 63   Temp 98.2 F (36.8 C)   Resp 14   Ht 5' (1.524 m)   Wt 70.8 kg   SpO2 98%   BMI 30.48 kg/m  Gen:   Awake, no distress   Resp:  Normal effort  MSK:   Moves extremities without difficulty  Other:    Medical Decision Making  Medically screening exam initiated at 9:21 PM.  Appropriate orders placed.  [name] was informed that the remainder of the evaluation will be completed by another provider, this initial triage assessment does not replace that evaluation, and the importance of remaining in the ED until their evaluation is complete.  Labs CXR EKG   Karie Kirks 10/19/22 2123

## 2022-10-19 NOTE — ED Triage Notes (Signed)
EMS report: Nausea, dry heaves, and decreased PO intake ongoing for several weeks. Has follow up appt with GI tomorrow. EMS VS: BP 160/66, 52 HR, 99% on 3 L baseline, and CBG 105.

## 2022-10-20 ENCOUNTER — Emergency Department (HOSPITAL_COMMUNITY): Payer: PPO

## 2022-10-20 ENCOUNTER — Ambulatory Visit (INDEPENDENT_AMBULATORY_CARE_PROVIDER_SITE_OTHER): Payer: PPO | Admitting: Nurse Practitioner

## 2022-10-20 VITALS — BP 167/78 | HR 65 | Ht 60.0 in

## 2022-10-20 DIAGNOSIS — K5792 Diverticulitis of intestine, part unspecified, without perforation or abscess without bleeding: Secondary | ICD-10-CM

## 2022-10-20 DIAGNOSIS — R112 Nausea with vomiting, unspecified: Secondary | ICD-10-CM | POA: Diagnosis not present

## 2022-10-20 LAB — TROPONIN I (HIGH SENSITIVITY): Troponin I (High Sensitivity): 13 ng/L (ref <18)

## 2022-10-20 MED ORDER — IOHEXOL 350 MG/ML SOLN
75.0000 mL | Freq: Once | INTRAVENOUS | Status: AC | PRN
  Administered 2022-10-20: 75 mL via INTRAVENOUS

## 2022-10-20 MED ORDER — METOCLOPRAMIDE HCL 5 MG/ML IJ SOLN
5.0000 mg | Freq: Once | INTRAMUSCULAR | Status: AC
  Administered 2022-10-20: 5 mg via INTRAVENOUS
  Filled 2022-10-20: qty 2

## 2022-10-20 MED ORDER — SUTAB 1479-225-188 MG PO TABS: ORAL_TABLET | ORAL | 0 refills | Status: DC

## 2022-10-20 NOTE — Patient Instructions (Addendum)
1) Metoclopramide '10mg'$  one tab by mouth scheduled every 6 hours  2) Ondansetron '8mg'$  ODT one tab dissolve on tongue Q 8 hours as needed   3) Go to the emergency room if you are unable to keep liquids or solid foods down   4) Ensure or Boost 1 to 2 cans daily if ok with your nephrologist   Thank you for trusting me with your gastrointestinal care!   Carl Best, CRNP

## 2022-10-20 NOTE — ED Provider Notes (Signed)
[facility] EMERGENCY DEPARTMENT Provider Note   CSN: [id number] Arrival date & time: 10/19/22  1951     History  Chief Complaint  Patient presents with   Nausea    Jean Mcclain is a 77 y.o. female.  Patient with h/o anxiety, arthritis, hypertension, hyperlipidemia, diabetes mellitus type 2, ESRD on hemodialysis, DVT (no longer on Eliquis), chronic anemia, obstructive sleep apnea, Parkinson's disease, GERD and diverticulitis with an abscess, chronic hypoxic respiratory failure on chronic O2 therapy --presents to the emergency department for evaluation of nausea, vomiting described as spitting up, decreased oral intake.  This is been a chronic problem for the patient over the past several months.  She was admitted to the hospital in October and has been following up with GI for the same.  She is scheduled for a colonoscopy in a couple of weeks.  At time of last admission, she was found to have a diverticular abscess and associated diverticulitis.  She has been taking Reglan at times.  She does not report much improvement at all at home.  She states that she takes "sips" of water at times.  No diarrhea.  No chest pain.  She is supposed to be on chronic oxygen but does not sound like she uses it all the time.  She complains of feeling generally well.  Was supposed to see GI today.  Also due for dialysis.       Home Medications Prior to Admission medications   Medication Sig Start Date End Date Taking? Authorizing Provider  acetaminophen (TYLENOL) 500 MG tablet Take 2 tablets (1,000 mg total) by mouth 3 (three) times daily. Patient taking differently: Take 1,000 mg by mouth 3 (three) times daily as needed (pain). 01/14/22   Dahal, Marlowe Aschoff, MD  albuterol (VENTOLIN HFA) 108 (90 Base) MCG/ACT inhaler Inhale 2 puffs into the lungs every 6 (six) hours as needed for wheezing or shortness of breath.    [provider]  atorvastatin (LIPITOR) 20 MG tablet Take 20 mg by  mouth in the morning.    [provider]  buprenorphine (BUTRANS) 10 MCG/HR Clarinda 1 patch onto the skin once a week.    [provider]  carbidopa-levodopa (SINEMET IR) 25-100 MG tablet Take 1-2 tablets by mouth See admin instructions. 2 entries on MAR: 2 tablets every morning (from 0600-1000) + 1 tablet twice daily (from 1200-1400 & 1800-2300)    [provider]  docusate sodium (COLACE) 100 MG capsule Take 200 mg by mouth at bedtime. Hold for loose stools    [provider]  doxercalciferol (HECTOROL) 4 MCG/2ML injection Inject 1 mL (2 mcg total) into the vein every Monday, Wednesday, and Friday with hemodialysis. Patient not taking: Reported on 09/29/2022 01/27/20   [place] Donnamarie Poag, DO  fluticasone [facility]) 50 MCG/ACT nasal spray Place 1 spray into both nostrils 2 (two) times daily as needed for allergies. Patient not taking: Reported on 10/18/2022    [provider]  gabapentin (NEURONTIN) 300 MG capsule Take 300 mg by mouth at bedtime.    [provider]  Glycerin, Laxative, (GLYCERIN, ADULT,) 2 g SUPP Place 1 suppository rectally daily as needed for moderate constipation. Patient not taking: Reported on 10/18/2022 07/28/22   Geradine Girt, DO  hydrOXYzine (ATARAX) 50 MG tablet Take 50 mg by mouth 2 (two) times daily as needed (no indication given on MAR).    [provider]  insulin aspart (NOVOLOG FLEXPEN) 100 UNIT/ML FlexPen Inject 3-9 Units  into the skin See admin instructions. Inject 3-9 units before meals and at bedtime per sliding scale: BS 70-100 : 3 units BS 101-150: 4 units BS 151-200: 5 units BS 201-250: 6 units BS 251-300: 7 units BS 301-350: 8 units BS 351-400: 9 units BS > 400 : call 911 HOLD on M-W-F when at dialysis    [provider]  insulin aspart (NOVOLOG) 100 UNIT/ML injection Inject 0-5 Units into the skin at bedtime. 01/14/22   Terrilee Croak, MD  loperamide (IMODIUM A-D) 2 MG tablet Take 2 mg  by mouth 2 (two) times daily as needed for diarrhea or loose stools.    [provider]  melatonin 5 MG TABS Take 5 mg by mouth at bedtime.    [provider]  metoCLOPramide (REGLAN) 10 MG tablet Take 1 tablet (10 mg total) by mouth every 6 (six) hours. 09/29/22   Phyllis Ginger, MD  midodrine (PROAMATINE) 10 MG tablet Take 10-20 mg by mouth See admin instructions. 20 mg every Monday, Wednesday, Friday prior to Hemodialysis and 10 mg half-way through hemodialysis.    [provider]  mirtazapine (REMERON) 15 MG tablet Take 15 mg by mouth at bedtime.    [provider]  Na Sulfate-K Sulfate-Mg Sulf 17.5-3.13-1.6 GM/177ML SOLN Refer to Instruction sent to you for your colonoscopy Patient not taking: Reported on 10/18/2022 09/19/22   Ladene Artist, MD  nystatin [facility]) powder Apply 1 Application topically daily. To groin and under breasts Kept in room, pt self applies    [provider]  ondansetron (ZOFRAN-ODT) 8 MG disintegrating tablet Take 1 tablet (8 mg total) by mouth every 6 (six) hours. Scheduled while on abx and then PRN after abx complete Patient not taking: Reported on 10/18/2022 07/28/22   Geradine Girt, DO  pantoprazole (PROTONIX) 40 MG tablet Take 1 tablet (40 mg total) by mouth 2 (two) times daily. BID for 14 days and then daily Patient taking differently: Take 40 mg by mouth in the morning. 07/28/22   Geradine Girt, DO  polyethylene glycol powder (GLYCOLAX/MIRALAX) 17 GM/SCOOP powder Take 17 g by mouth daily as needed (constipation). Patient not taking: Reported on 10/18/2022    [provider]  pramoxine (SARNA SENSITIVE) 1 % LOTN Apply 1 Application topically daily. Kept in room, pt self applies    [provider]  promethazine (PHENERGAN) 12.5 MG suppository Place 1 suppository (12.5 mg total) rectally every 6 (six) hours as needed for nausea or vomiting. 07/28/22   Geradine Girt, DO  Ropinirole HCl 12 MG TB24 Take 12  mg by mouth in the morning.    [provider]  Simethicone (GAS-X EXTRA STRENGTH PO) Take 1 tablet by mouth every 12 (twelve) hours as needed (heartburn, indigestion).    [provider]  tamsulosin (FLOMAX) 0.4 MG CAPS capsule Take 1 capsule (0.4 mg total) by mouth daily. Patient taking differently: Take 0.4 mg by mouth at bedtime. 01/16/22   Terrilee Croak, MD      Allergies    Ms contin [morphine]    Review of Systems   Review of Systems  Physical Exam Updated Vital Signs BP (!) 143/49 (BP Location: Left Wrist)   Pulse (!) 52   Temp 98.3 F (36.8 C)   Resp 19   Ht 5' (1.524 m)   Wt 70.8 kg   SpO2 (!) 87%   BMI 30.48 kg/m  Physical Exam Vitals and nursing note reviewed.  Constitutional:  General: She is not in acute distress.    Appearance: She is well-developed.  HENT:     Head: Normocephalic and atraumatic.     Right Ear: External ear normal.     Left Ear: External ear normal.     Nose: Nose normal.  Eyes:     Conjunctiva/sclera: Conjunctivae normal.  Cardiovascular:     Rate and Rhythm: Normal rate and regular rhythm.     Heart sounds: No murmur heard. Pulmonary:     Effort: No respiratory distress.     Breath sounds: No wheezing, rhonchi or rales.  Abdominal:     Palpations: Abdomen is soft.     Tenderness: There is abdominal tenderness. There is no guarding or rebound.     Comments: Mild generalized abdominal tenderness  Musculoskeletal:     Cervical back: Normal range of motion and neck supple.     Right lower leg: No edema.     Left lower leg: No edema.     Comments: RUE dialysis access  Skin:    General: Skin is warm and dry.     Findings: No rash.  Neurological:     General: No focal deficit present.     Mental Status: She is alert. Mental status is at baseline.     Motor: No weakness.  Psychiatric:        Mood and Affect: Mood normal.     ED Results / Procedures / Treatments   Labs (all labs ordered are listed, but only  abnormal results are displayed) Labs Reviewed  CBC WITH DIFFERENTIAL/PLATELET - Abnormal; Notable for the following components:      Result Value   RBC 3.25 (*)    Hemoglobin 9.0 (*)    HCT 29.7 (*)    RDW 19.4 (*)    Lymphs Abs 0.3 (*)    All other components within normal limits  COMPREHENSIVE METABOLIC PANEL - Abnormal; Notable for the following components:   Chloride 97 (*)    Creatinine, Ser 2.03 (*)    Calcium 8.5 (*)    Total Protein 6.0 (*)    Albumin 3.2 (*)    AST 6 (*)    GFR, Estimated 25 (*)    All other components within normal limits  BRAIN NATRIURETIC PEPTIDE - Abnormal; Notable for the following components:   B Natriuretic Peptide 929.4 (*)    All other components within normal limits  RESP PANEL BY RT-PCR (RSV, FLU A&B, COVID)  RVPGX2  LIPASE, BLOOD  URINALYSIS, ROUTINE W REFLEX MICROSCOPIC  TROPONIN I (HIGH SENSITIVITY)  TROPONIN I (HIGH SENSITIVITY)    EKG None  Radiology DG Chest 1 View  Result Date: 10/19/2022 CLINICAL DATA:  Shortness of breath and decreased appetite. EXAM: CHEST  1 VIEW COMPARISON:  July 21, 2022 FINDINGS: The heart size and mediastinal contours are within normal limits. Mild atelectasis and/or infiltrate is seen within the left lung base. There is a small left pleural effusion. No pneumothorax is identified. Multiple chronic bilateral rib fractures are seen. IMPRESSION: 1. Mild left basilar atelectasis and/or infiltrate. 2. Small left pleural effusion. Electronically Signed   By: Virgina Norfolk M.D.   On: 10/19/2022 21:53    Procedures Procedures    Medications Ordered in ED Medications  metoCLOPramide (REGLAN) injection 5 mg (has no administration in time range)  ondansetron (ZOFRAN-ODT) disintegrating tablet 4 mg (4 mg Oral Given 10/19/22 2254)    ED Course/ Medical Decision Making/ A&P    Patient seen and examined.  History obtained directly from patient. Work-up including labs, imaging, EKG ordered in triage, if  performed, were reviewed.    Labs/EKG: Independently reviewed and interpreted.  This included: CBC with differential with hemoglobin 9.0 at baseline, normal white blood cell count at 8.1 otherwise unremarkable; CMP normal sodium and potassium, creatinine 2.03 close to baseline with ESRD, BUN only 10, normal liver function tests; lipase normal; BNP elevated at 930 and patient with ESRD; troponin 14; respiratory viral panel negative.   Imaging: Independently visualized and interpreted.  This included: Increased small left pleural effusion, likely new from previous.  Medications/Fluids: Ordered: IV Reglan   Most recent vital signs reviewed and are as follows: BP (!) 122/93   Pulse 74   Temp 98.4 F (36.9 C) (Oral)   Resp 20   Ht 5' (1.524 m)   Wt 70.8 kg   SpO2 100% Comment: pt on 3LNS  BMI 30.48 kg/m   Initial impression: chronic N/V, ESRD, h/o diverticulitis.   Will discuss with Dr. Johnney Killian.     Reassessment performed. Patient appears stable. Will proceed with CT to eval for worsening diverticulitis as trigger for current symptoms.  Patient discussed with Dr. Johnney Killian who will see.    1:35 PM Reassessment performed. Patient appears stable.   Imaging personally visualized and interpreted including: CT abd/pelvis which shows slightly larger volume pleural effusion, especially on the right side, stable diverticulitis suspected.  Most current vital signs reviewed and are as follows: BP (!) 166/75 (BP Location: Right Arm)   Pulse 66   Temp 98.2 F (36.8 C) (Oral)   Resp 18   Ht 5' (1.524 m)   Wt 70.8 kg   SpO2 100%   BMI 30.48 kg/m   Plan: Discharge to home.  Patient has a gastroenterology appointment at 3 PM which she is encouraged to make.  Her facility is coming to pick her up and take her there and then to home.  Other home care instructions discussed: Continued use of home medications and antiemetics  ED return instructions discussed: The patient was urged to return  to the Emergency Department immediately with worsening of current symptoms, worsening abdominal pain, persistent vomiting, blood noted in stools, fever, or any other concerns. The patient verbalized understanding.   Follow-up instructions discussed: Patient encouraged to follow-up with GI as planned.                          Medical Decision Making Amount and/or Complexity of Data Reviewed Radiology: ordered.  Risk Prescription drug management.   Patient with chronic nausea and decreased appetite.  She has been evaluated in the past for the same.  She has had diverticulitis with complication however this appears to be stable today.  Her white blood cell count is normal and low concern for new infection.  No signs of bowel obstruction or other acute intra-abdominal process.  She continues to have some signs of chronic diverticulitis without acute complications.  She has already been on several courses of antibiotics for this.  Will defer additional treatment decision to GI.  She has upcoming colonoscopy.  From a dialysis standpoint, potassium is normal, slightly enlarged bilateral pleural effusions but no hypoxia or difficulty breathing.  Patient's current symptoms seem to be slight decompensation of her chronic problems.  She would benefit from GI follow-up as planned.  Unfortunately she has had extended ED wait time and visit today.  She should be able to make her GI appointment which  will be beneficial.  For this patient's complaint of abdominal pain, the following conditions were considered on the differential diagnosis: gastritis/PUD, enteritis/duodenitis, appendicitis, cholelithiasis/cholecystitis, cholangitis, pancreatitis, ruptured viscus, colitis, diverticulitis, small/large bowel obstruction, proctitis, cystitis, pyelonephritis, ureteral colic, aortic dissection, aortic aneurysm. In women, ectopic pregnancy, pelvic inflammatory disease, ovarian cysts, and tubo-ovarian abscess were also  considered. Atypical chest etiologies were also considered including ACS, PE, and pneumonia.         Final Clinical Impression(s) / ED Diagnoses Final diagnoses:  Chronic nausea  ESRD (end stage renal disease) ([place])  Diverticulitis    Rx / DC Orders ED Discharge Orders     None         Carlisle Cater, PA-C 10/20/22 1339    Charlesetta Shanks, MD 10/20/22 1411

## 2022-10-20 NOTE — Discharge Instructions (Signed)
Your lab work today shows that you have slightly low red blood cell count, but this appears to be stable.  You do not have any significant problems related to dialysis.  Your CAT scan continues to show a possible area of diverticulitis, and a little more fluid in the lungs today which can be controlled with dialysis.  We do not see any other signs of severe infection or pneumonia.  Please go to your gastroenterologist appointment as planned.  They are best equipped to help you with your chronic problems.  Return if you have any worsening, persistent vomiting, fevers.

## 2022-10-20 NOTE — Progress Notes (Signed)
10/20/2022 Jean Mcclain [id number] 11/16/1945   Chief Complaint: N/V and dry heaves   History of Present Illness: Jean Mcclain is a 77 year old female with a past medical history of anxiety, arthritis, hypertension, hyperlipidemia, diabetes mellitus type 2, ESRD on hemodialysis, DVT (no longer on Eliquis), chronic anemia, obstructive sleep apnea, Parkinson's disease, GERD and diverticulitis with an abscess.    I last saw Jean Mcclain in office 08/30/2022 follow-up after she was discharged from the hospital with nausea/vomiting and CTAP showed diverticulitis with a small abscess which was treated with antibiotics.  As previously noted,  An EGD was scheduled but was subsequently canceled as she ate prior to the procedure. Her nausea and vomiting stabilized and she was discharged to [facility] on 07/28/2022.  The time of her office visit 08/30/2022, her nausea and vomiting resolved and was thought to be likely due to her acute illness with diverticulitis with an abscess therefore an EGD was not pursued.  She was scheduled for an outpatient colonoscopy with Dr. Fuller Plan 11/03/2022.    Follow-up CTAP with contrast was completed 09/15/2022 which showed acute diverticulitis to the sigmoid colon extending along a 6 cm region of substantial posterior wall thickening with gas tracking along the posterior wall of the colon suggestive of a mild local contained micro perforation or small paracolic abscess which was similar in position and appearance compared to 07/21/2022. Given the similar appearance in this region on prior exam, the possibility of malignancy involving the sigmoid colon was not excluded.  She was prescribed Augmentin 875 mg 1 p.o. twice daily for 10 days for possible smoldering diverticulitis.  The CT also showed a 3.6 cm right ovarian lesion and a 1.6 cm right kidney lesion and she was instructed to follow-up with her PCP regarding these results.  She contacted our office 09/29/2022  with complaints of nausea and diarrhea.  She was advised by Dr. Fuller Plan to go to the emergency room for further evaluation.  Labs in the ED showed WBC count of 5.5.  Hemoglobin 10.1 up from 8.2.  BUN 5.  Creatinine 3.18 up from 2.93.  Sodium 137.  Potassium 3.4.  Glucose 91.  Bili 0.7.  Alk phos 119.  AST 7.  ALT 5.  Lipase 24.  Blood cultures no growth after 5 days.  CTAP without contrast showed unchanged mild sigmoid diverticulitis without evidence of perforation or abscess.  She received Reglan IV and Zosyn IV in the ED.  Clinical status stabilized and she was discharged back to [facility].  She continued to have N/V and dry heaves with poor po intake so she presented to the ED 10/19/2022.  WBC 8.1.  Hemoglobin 9.0 down from 10.1.  2.03.  Normal/low LFTs.  Lipase 30.  CTAP with contrast showed mild acute diverticulitis at the junction of the descending colon and sigmoid colon without perforation or abscess, no significant change from CT completed 09/29/2022.  Bilateral moderate pleural effusions with passive atelectasis was noted.  BNP 930.  EKG without acute ischemia.  She received IV Reglan and her nausea and dry heaves abated.  She was discharged from the ED at 1 PM today with instructions to keep her prescheduled office appointment in our office at 3 PM.  She continued to have dry heaves then developed generalized body shakes that she was transported from the ED to our office.  Currently, she is not demonstrating any tremors, rigors or body shakes.  She briefly had nausea without active dry heaves or  vomiting out her appointment time.  She denies having any chest pain, palpitations or shortness of breath.  She denies having any diarrhea.  She reported passing a solid stool yesterday, she does not look at the color of her stool.  He is unable to confirm if she is taking Reglan 10 mg 1 p.o. every 6 hours or ondansetron 8 mg ODT 1 p.o. every 8 hours as needed.  I attempted to contact the [facility]  nursing staff but was unsuccessful after I was transferred 3 times to nonclinical staff members who could verify which medications she is taking and when.  Remains in a wheelchair with a LLE secondary to having a fractured left ankle.   Current Outpatient Medications on File Prior to Visit  Medication Sig Dispense Refill   acetaminophen (TYLENOL) 500 MG tablet Take 2 tablets (1,000 mg total) by mouth 3 (three) times daily. (Patient taking differently: Take 1,000 mg by mouth 3 (three) times daily as needed (pain).) 30 tablet 0   albuterol (VENTOLIN HFA) 108 (90 Base) MCG/ACT inhaler Inhale 2 puffs into the lungs every 6 (six) hours as needed for wheezing or shortness of breath.     atorvastatin (LIPITOR) 20 MG tablet Take 20 mg by mouth in the morning.     buprenorphine (BUTRANS) 10 MCG/HR PTWK Place 1 patch onto the skin once a week.     carbidopa-levodopa (SINEMET IR) 25-100 MG tablet Take 1-2 tablets by mouth See admin instructions. 2 entries on MAR: 2 tablets every morning (from 0600-1000) + 1 tablet twice daily (from 1200-1400 & 1800-2300)     docusate sodium (COLACE) 100 MG capsule Take 200 mg by mouth at bedtime. Hold for loose stools     doxercalciferol (HECTOROL) 4 MCG/2ML injection Inject 1 mL (2 mcg total) into the vein every Monday, Wednesday, and Friday with hemodialysis. 2 mL    fluticasone (FLONASE) 50 MCG/ACT nasal spray Place 1 spray into both nostrils 2 (two) times daily as needed for allergies.     gabapentin (NEURONTIN) 300 MG capsule Take 300 mg by mouth at bedtime.     Glycerin, Laxative, (GLYCERIN, ADULT,) 2 g SUPP Place 1 suppository rectally daily as needed for moderate constipation. 20 suppository 0   hydrOXYzine (ATARAX) 50 MG tablet Take 50 mg by mouth 2 (two) times daily as needed (no indication given on MAR).     insulin aspart (NOVOLOG FLEXPEN) 100 UNIT/ML FlexPen Inject 3-9 Units into the skin See admin instructions. Inject 3-9 units before meals and at bedtime per  sliding scale: BS 70-100 : 3 units BS 101-150: 4 units BS 151-200: 5 units BS 201-250: 6 units BS 251-300: 7 units BS 301-350: 8 units BS 351-400: 9 units BS > 400 : call 911 HOLD on M-W-F when at dialysis     insulin aspart (NOVOLOG) 100 UNIT/ML injection Inject 0-5 Units into the skin at bedtime. 10 mL 11   loperamide (IMODIUM A-D) 2 MG tablet Take 2 mg by mouth 2 (two) times daily as needed for diarrhea or loose stools.     melatonin 5 MG TABS Take 5 mg by mouth at bedtime.     metoCLOPramide (REGLAN) 10 MG tablet Take 1 tablet (10 mg total) by mouth every 6 (six) hours. 30 tablet 0   midodrine (PROAMATINE) 10 MG tablet Take 10-20 mg by mouth See admin instructions. 20 mg every Monday, Wednesday, Friday prior to Hemodialysis and 10 mg half-way through hemodialysis.     mirtazapine (REMERON)  15 MG tablet Take 15 mg by mouth at bedtime.     Na Sulfate-K Sulfate-Mg Sulf 17.5-3.13-1.6 GM/177ML SOLN Refer to Instruction sent to you for your colonoscopy 354 mL 0   nystatin [facility]) powder Apply 1 Application topically daily. To groin and under breasts Kept in room, pt self applies     ondansetron (ZOFRAN-ODT) 8 MG disintegrating tablet Take 1 tablet (8 mg total) by mouth every 6 (six) hours. Scheduled while on abx and then PRN after abx complete 60 tablet 0   pantoprazole (PROTONIX) 40 MG tablet Take 1 tablet (40 mg total) by mouth 2 (two) times daily. BID for 14 days and then daily (Patient taking differently: Take 40 mg by mouth in the morning.) 60 tablet 0   polyethylene glycol powder (GLYCOLAX/MIRALAX) 17 GM/SCOOP powder Take 17 g by mouth daily as needed (constipation).     pramoxine (SARNA SENSITIVE) 1 % LOTN Apply 1 Application topically daily. Kept in room, pt self applies     promethazine (PHENERGAN) 12.5 MG suppository Place 1 suppository (12.5 mg total) rectally every 6 (six) hours as needed for nausea or vomiting. 12 each 0   Ropinirole HCl 12 MG TB24 Take 12 mg by mouth in the  morning.     Simethicone (GAS-X EXTRA STRENGTH PO) Take 1 tablet by mouth every 12 (twelve) hours as needed (heartburn, indigestion).     tamsulosin (FLOMAX) 0.4 MG CAPS capsule Take 1 capsule (0.4 mg total) by mouth daily. (Patient taking differently: Take 0.4 mg by mouth at bedtime.) 30 capsule 3   No current facility-administered medications on file prior to visit.   Allergies  Allergen Reactions   Ms Contin [Morphine] Nausea Only   Current Medications, Allergies, Past Medical History, Past Surgical History, Family History and Social History were reviewed in [employer] record.   Review of Systems:   Constitutional: ? Weight loss.  Respiratory: Negative for shortness of breath.   Cardiovascular: Negative for chest pain or palpitations.  Gastrointestinal: See HPI.  Musculoskeletal: + Back pain.  Neurological: Negative for dizziness, headaches or paresthesias.    Physical Exam: BP (!) 167/78 (BP Location: Left Arm, Patient Position: Sitting, Cuff Size: Normal)   Pulse 65   Ht 5' (1.524 m)   SpO2 (!) 85%   BMI 30.48 kg/m .  Patient unable to bear weight therefore weight was not obtained.   General: Chronically ill appearing 77 year old female in no acute distress. Head: Normocephalic and atraumatic. Eyes: No scleral icterus. Conjunctiva pink . Ears: Normal auditory acuity. Mouth: Dentition intact. No ulcers or lesions.  Lungs: Clear throughout to auscultation. Heart: Regular rate and rhythm. Murmur/bruit from fistula, no rub or gallop appreciated  Abdomen: Soft, nontender and nondistended. No masses or hepatomegaly. Normal bowel sounds x 4 quadrants.  Rectal: Deferred.  Musculoskeletal: Symmetrical with no gross deformities. Extremities: LLE in boot/brace.  Neurological: Alert oriented x 4. No focal deficits.  Psychological: Alert and cooperative. Normal mood and affect  Assessment and Recommendations:  47) 77 year old female with recurrent N/V and  dry heaves with poor oral intake.  -EGD at [facility], cannot add to colonoscopy date 11/03/2022. Await further recommendations per Dr. Fuller Plan. -Reglan 10 mg p.o. every 6 hours scheduled. Ondansetron '8mg'$  ODT one tab po Q 8hrs PRN.  I attempted to contact the SNF nursing staff to verify the above prescribed antiemetic regimen, however, I was unable to speak to any nursing or medical staff at this time. -Pantoprazole 40 mg p.o. twice  daily -Soft renal diet as tolerated -Ensure or Boost 2 cans daily if ok with nephrology  2) Sigmoid diverticulitis with a small abscess which required hospital admission 07/2022 treated with antibiotics. Follow-up CT 09/15/2022 showed acute diverticulitis to the sigmoid colon extending along a 6 cm region of substantial posterior wall thickening with gas tracking along the posterior wall of the colon suggestive of a mild local contained micro perforation or small paracolic abscess which was similar in position and appearance compared to 07/21/2022.  Malignancy was not excluded. She was prescribed Augmentin 875 mg 1 p.o. twice daily for 10 days for possible smoldering diverticulitis.  She was seen in the ED 09/29/2022 with nausea, diarrhea and CTAP without contrast showed unchanged mild sigmoid diverticulitis without evidence of perforation or abscess Zosyn IV x 1. CTAP in the ED 10/19/2022 showed mild acute diverticulitis at the junction of the descending colon and sigmoid colon without perforation or abscess.  She denies having any abdominal pain at this time.  CT findings likely show chronic inflammation to the sigmoid colon and less likely acute diverticulitis. -Antibiotic therapy deferred -Dr. Fuller Plan to review CTAPs -Patient to proceed with colonoscopy at [facility] as previously scheduled 11/03/2022.   3) ESRD on HD every Mon-Wed-Fri    4) Chronic anemia secondary to ESRD, chronic disease. No overt GI bleeding.   5) CTAP 09/29/2022 showed bilateral moderate pleural  effusions with passive atelectasis.  Patient reported utilizing oxygen nasal cannula for the past week.   Today's encounter was 40 minutes which included precharting, chart/result review, history/exam, face-to-face time used for counseling, formulating a treatment plan with follow-up, phone calls to the SNF and documentation.

## 2022-10-20 NOTE — ED Notes (Signed)
Pt wears three liters of oxygen at baseline, pt did not have oxygen on when came into room. Oxygen applied and patient is now at 100%.

## 2022-10-20 NOTE — ED Provider Notes (Signed)
I provided a substantive portion of the care of this patient.  I personally performed the entirety of the medical decision making for this encounter.  EKG Interpretation  Date/Time:  Wednesday October 19 2022 22:15:47 EST Ventricular Rate:  59 PR Interval:  166 QRS Duration: 82 QT Interval:  434 QTC Calculation: 429 R Axis:   -20 Text Interpretation: Sinus bradycardia Left ventricular hypertrophy with repolarization abnormality ( R in aVL ) Abnormal ECG When compared with ECG of 29-Sep-2022 17:17, PREVIOUS ECG IS PRESENT rythm appaers sinus as compared to previous afib, otherwise similar to older tracing Confirmed by Charlesetta Shanks [phone]) on 10/20/2022 9:37:16 AM   Patient reports that she has had ongoing problems with belching and feeling of congestion that goes down into her stomach and makes her nauseated.  She reports she has had inability to eat due to loss of interest in food.  Nothing smells good or appeals.  She reports she feels nauseated if she eats.  She reports she has had weight loss.  Patient is alert and nontoxic.  Her abdominal examination is soft without guarding.  CT scan was done and shows a equivocal area of diverticulitis that has unchanged since 12\21.  Patient has a GI appointment scheduled today per her report with Dr. Fuller Plan at 3 PM.  We reviewed all of her current complaints and diagnostic findings.  At this time I feel that her best medical management will be to go to her GI appointment for further assessment of symptoms that are suggestive of reflux or dyspepsia and a stable.  Area of inflammation on the CT scan.  I agree with plan of management.   Charlesetta Shanks, MD 10/20/22 [phone]

## 2022-10-21 ENCOUNTER — Telehealth: Payer: Self-pay | Admitting: Nurse Practitioner

## 2022-10-21 NOTE — Telephone Encounter (Unsigned)
Left message with Nursing staff at [facility]  to call back: Direct number provided:

## 2022-10-21 NOTE — Telephone Encounter (Signed)
Jean Mcclain, refer to office visit 10/20/2022.  Dr. Lynne Leader input as follows:  "The cause of her N/V isn't clear. If her N/V doesn't significantly improved it is unlikely she'll tolerate a bowel prep. We can try however if she doesn't tolerate the bowel prep we can proceed with just an EGD".   Pls contact the patient and let her know Dr. Lynne Leader input.   Pls verify with the nursing staff at the SNF if patient is receiving Regland Q 6 hrs as scheduled and Ondansetron QQ 8hrs PRN. THX.

## 2022-10-24 ENCOUNTER — Encounter (HOSPITAL_COMMUNITY): Payer: Self-pay

## 2022-10-24 ENCOUNTER — Inpatient Hospital Stay (HOSPITAL_COMMUNITY)
Admission: EM | Admit: 2022-10-24 | Discharge: 2022-11-09 | DRG: 377 | Disposition: A | Discharge status: Hospice | Payer: PPO | Source: Skilled Nursing Facility | Attending: Internal Medicine | Admitting: Internal Medicine

## 2022-10-24 ENCOUNTER — Emergency Department (HOSPITAL_COMMUNITY): Payer: PPO

## 2022-10-24 ENCOUNTER — Observation Stay (HOSPITAL_BASED_OUTPATIENT_CLINIC_OR_DEPARTMENT_OTHER): Payer: PPO

## 2022-10-24 ENCOUNTER — Other Ambulatory Visit: Payer: Self-pay

## 2022-10-24 DIAGNOSIS — R259 Unspecified abnormal involuntary movements: Secondary | ICD-10-CM

## 2022-10-24 DIAGNOSIS — D62 Acute posthemorrhagic anemia: Secondary | ICD-10-CM | POA: Diagnosis present

## 2022-10-24 DIAGNOSIS — M545 Low back pain, unspecified: Secondary | ICD-10-CM | POA: Diagnosis not present

## 2022-10-24 DIAGNOSIS — R110 Nausea: Secondary | ICD-10-CM | POA: Diagnosis present

## 2022-10-24 DIAGNOSIS — D631 Anemia in chronic kidney disease: Secondary | ICD-10-CM | POA: Diagnosis present

## 2022-10-24 DIAGNOSIS — I953 Hypotension of hemodialysis: Secondary | ICD-10-CM | POA: Diagnosis not present

## 2022-10-24 DIAGNOSIS — J90 Pleural effusion, not elsewhere classified: Secondary | ICD-10-CM | POA: Diagnosis present

## 2022-10-24 DIAGNOSIS — Z96652 Presence of left artificial knee joint: Secondary | ICD-10-CM | POA: Diagnosis present

## 2022-10-24 DIAGNOSIS — L89151 Pressure ulcer of sacral region, stage 1: Secondary | ICD-10-CM | POA: Diagnosis present

## 2022-10-24 DIAGNOSIS — R933 Abnormal findings on diagnostic imaging of other parts of digestive tract: Secondary | ICD-10-CM

## 2022-10-24 DIAGNOSIS — M7989 Other specified soft tissue disorders: Secondary | ICD-10-CM

## 2022-10-24 DIAGNOSIS — R001 Bradycardia, unspecified: Secondary | ICD-10-CM | POA: Diagnosis not present

## 2022-10-24 DIAGNOSIS — G9341 Metabolic encephalopathy: Secondary | ICD-10-CM | POA: Diagnosis present

## 2022-10-24 DIAGNOSIS — R253 Fasciculation: Secondary | ICD-10-CM | POA: Diagnosis not present

## 2022-10-24 DIAGNOSIS — N186 End stage renal disease: Secondary | ICD-10-CM | POA: Diagnosis not present

## 2022-10-24 DIAGNOSIS — D179 Benign lipomatous neoplasm, unspecified: Secondary | ICD-10-CM | POA: Diagnosis present

## 2022-10-24 DIAGNOSIS — Z825 Family history of asthma and other chronic lower respiratory diseases: Secondary | ICD-10-CM

## 2022-10-24 DIAGNOSIS — K5733 Diverticulitis of large intestine without perforation or abscess with bleeding: Principal | ICD-10-CM | POA: Diagnosis present

## 2022-10-24 DIAGNOSIS — I120 Hypertensive chronic kidney disease with stage 5 chronic kidney disease or end stage renal disease: Secondary | ICD-10-CM | POA: Diagnosis present

## 2022-10-24 DIAGNOSIS — J9811 Atelectasis: Secondary | ICD-10-CM | POA: Diagnosis present

## 2022-10-24 DIAGNOSIS — Z79890 Hormone replacement therapy (postmenopausal): Secondary | ICD-10-CM

## 2022-10-24 DIAGNOSIS — S40012A Contusion of left shoulder, initial encounter: Secondary | ICD-10-CM | POA: Diagnosis present

## 2022-10-24 DIAGNOSIS — Z66 Do not resuscitate: Secondary | ICD-10-CM | POA: Diagnosis present

## 2022-10-24 DIAGNOSIS — E785 Hyperlipidemia, unspecified: Secondary | ICD-10-CM | POA: Diagnosis present

## 2022-10-24 DIAGNOSIS — G20A1 Parkinson's disease without dyskinesia, without mention of fluctuations: Secondary | ICD-10-CM | POA: Diagnosis present

## 2022-10-24 DIAGNOSIS — G2581 Restless legs syndrome: Secondary | ICD-10-CM | POA: Diagnosis present

## 2022-10-24 DIAGNOSIS — E43 Unspecified severe protein-calorie malnutrition: Secondary | ICD-10-CM | POA: Diagnosis present

## 2022-10-24 DIAGNOSIS — Z91158 Patient's noncompliance with renal dialysis for other reason: Secondary | ICD-10-CM

## 2022-10-24 DIAGNOSIS — Z794 Long term (current) use of insulin: Secondary | ICD-10-CM

## 2022-10-24 DIAGNOSIS — R54 Age-related physical debility: Secondary | ICD-10-CM | POA: Diagnosis present

## 2022-10-24 DIAGNOSIS — G40909 Epilepsy, unspecified, not intractable, without status epilepticus: Secondary | ICD-10-CM

## 2022-10-24 DIAGNOSIS — E1122 Type 2 diabetes mellitus with diabetic chronic kidney disease: Secondary | ICD-10-CM | POA: Diagnosis present

## 2022-10-24 DIAGNOSIS — D649 Anemia, unspecified: Secondary | ICD-10-CM | POA: Diagnosis not present

## 2022-10-24 DIAGNOSIS — F419 Anxiety disorder, unspecified: Secondary | ICD-10-CM | POA: Diagnosis present

## 2022-10-24 DIAGNOSIS — K5792 Diverticulitis of intestine, part unspecified, without perforation or abscess without bleeding: Secondary | ICD-10-CM | POA: Diagnosis present

## 2022-10-24 DIAGNOSIS — Z79899 Other long term (current) drug therapy: Secondary | ICD-10-CM

## 2022-10-24 DIAGNOSIS — Z87891 Personal history of nicotine dependence: Secondary | ICD-10-CM

## 2022-10-24 DIAGNOSIS — Z6830 Body mass index (BMI) 30.0-30.9, adult: Secondary | ICD-10-CM

## 2022-10-24 DIAGNOSIS — E669 Obesity, unspecified: Secondary | ICD-10-CM | POA: Diagnosis present

## 2022-10-24 DIAGNOSIS — Z981 Arthrodesis status: Secondary | ICD-10-CM

## 2022-10-24 DIAGNOSIS — R7989 Other specified abnormal findings of blood chemistry: Secondary | ICD-10-CM

## 2022-10-24 DIAGNOSIS — R221 Localized swelling, mass and lump, neck: Secondary | ICD-10-CM | POA: Diagnosis present

## 2022-10-24 DIAGNOSIS — Z539 Procedure and treatment not carried out, unspecified reason: Secondary | ICD-10-CM | POA: Diagnosis present

## 2022-10-24 DIAGNOSIS — Z992 Dependence on renal dialysis: Secondary | ICD-10-CM | POA: Diagnosis not present

## 2022-10-24 DIAGNOSIS — S20212A Contusion of left front wall of thorax, initial encounter: Secondary | ICD-10-CM | POA: Diagnosis present

## 2022-10-24 DIAGNOSIS — R1310 Dysphagia, unspecified: Secondary | ICD-10-CM | POA: Diagnosis not present

## 2022-10-24 DIAGNOSIS — R4182 Altered mental status, unspecified: Secondary | ICD-10-CM

## 2022-10-24 DIAGNOSIS — Z885 Allergy status to narcotic agent status: Secondary | ICD-10-CM

## 2022-10-24 DIAGNOSIS — G40109 Localization-related (focal) (partial) symptomatic epilepsy and epileptic syndromes with simple partial seizures, not intractable, without status epilepticus: Secondary | ICD-10-CM | POA: Diagnosis present

## 2022-10-24 DIAGNOSIS — Z7901 Long term (current) use of anticoagulants: Secondary | ICD-10-CM

## 2022-10-24 DIAGNOSIS — E11649 Type 2 diabetes mellitus with hypoglycemia without coma: Secondary | ICD-10-CM | POA: Diagnosis not present

## 2022-10-24 DIAGNOSIS — Z993 Dependence on wheelchair: Secondary | ICD-10-CM

## 2022-10-24 DIAGNOSIS — S2002XA Contusion of left breast, initial encounter: Secondary | ICD-10-CM | POA: Diagnosis present

## 2022-10-24 DIAGNOSIS — X58XXXA Exposure to other specified factors, initial encounter: Secondary | ICD-10-CM | POA: Diagnosis present

## 2022-10-24 DIAGNOSIS — E0822 Diabetes mellitus due to underlying condition with diabetic chronic kidney disease: Secondary | ICD-10-CM

## 2022-10-24 DIAGNOSIS — Z515 Encounter for palliative care: Secondary | ICD-10-CM

## 2022-10-24 DIAGNOSIS — D696 Thrombocytopenia, unspecified: Secondary | ICD-10-CM | POA: Diagnosis present

## 2022-10-24 DIAGNOSIS — Z806 Family history of leukemia: Secondary | ICD-10-CM

## 2022-10-24 DIAGNOSIS — R627 Adult failure to thrive: Secondary | ICD-10-CM | POA: Diagnosis present

## 2022-10-24 DIAGNOSIS — Z8249 Family history of ischemic heart disease and other diseases of the circulatory system: Secondary | ICD-10-CM

## 2022-10-24 DIAGNOSIS — I69398 Other sequelae of cerebral infarction: Secondary | ICD-10-CM

## 2022-10-24 DIAGNOSIS — N182 Chronic kidney disease, stage 2 (mild): Secondary | ICD-10-CM

## 2022-10-24 DIAGNOSIS — R112 Nausea with vomiting, unspecified: Secondary | ICD-10-CM | POA: Diagnosis present

## 2022-10-24 DIAGNOSIS — I708 Atherosclerosis of other arteries: Secondary | ICD-10-CM | POA: Diagnosis present

## 2022-10-24 DIAGNOSIS — E871 Hypo-osmolality and hyponatremia: Secondary | ICD-10-CM | POA: Diagnosis present

## 2022-10-24 DIAGNOSIS — D3502 Benign neoplasm of left adrenal gland: Secondary | ICD-10-CM | POA: Diagnosis present

## 2022-10-24 DIAGNOSIS — R32 Unspecified urinary incontinence: Secondary | ICD-10-CM | POA: Diagnosis present

## 2022-10-24 DIAGNOSIS — Z9981 Dependence on supplemental oxygen: Secondary | ICD-10-CM

## 2022-10-24 DIAGNOSIS — J9621 Acute and chronic respiratory failure with hypoxia: Secondary | ICD-10-CM | POA: Diagnosis not present

## 2022-10-24 DIAGNOSIS — N289 Disorder of kidney and ureter, unspecified: Secondary | ICD-10-CM

## 2022-10-24 DIAGNOSIS — I700 Atherosclerosis of aorta: Secondary | ICD-10-CM | POA: Diagnosis present

## 2022-10-24 DIAGNOSIS — Z86718 Personal history of other venous thrombosis and embolism: Secondary | ICD-10-CM

## 2022-10-24 DIAGNOSIS — G4733 Obstructive sleep apnea (adult) (pediatric): Secondary | ICD-10-CM | POA: Diagnosis present

## 2022-10-24 DIAGNOSIS — Z7984 Long term (current) use of oral hypoglycemic drugs: Secondary | ICD-10-CM

## 2022-10-24 DIAGNOSIS — R946 Abnormal results of thyroid function studies: Secondary | ICD-10-CM | POA: Diagnosis present

## 2022-10-24 LAB — URINALYSIS, ROUTINE W REFLEX MICROSCOPIC
Bilirubin Urine: NEGATIVE
Glucose, UA: NEGATIVE mg/dL
Ketones, ur: NEGATIVE mg/dL
Nitrite: NEGATIVE
Protein, ur: 100 mg/dL — AB
Specific Gravity, Urine: 1.018 (ref 1.005–1.030)
WBC, UA: >50 WBC/hpf — ABNORMAL HIGH (ref 0–5)
pH: 6 (ref 5.0–8.0)

## 2022-10-24 LAB — COMPREHENSIVE METABOLIC PANEL
ALT: 5 U/L (ref 0–44)
AST: 7 U/L — ABNORMAL LOW (ref 15–41)
Albumin: 2.7 g/dL — ABNORMAL LOW (ref 3.5–5.0)
Alkaline Phosphatase: 96 U/L (ref 38–126)
Anion gap: 12 (ref 5–15)
BUN: 34 mg/dL — ABNORMAL HIGH (ref 8–23)
CO2: 27 mmol/L (ref 22–32)
Calcium: 7.8 mg/dL — ABNORMAL LOW (ref 8.9–10.3)
Chloride: 96 mmol/L — ABNORMAL LOW (ref 98–111)
Creatinine, Ser: 4.74 mg/dL — ABNORMAL HIGH (ref 0.44–1.00)
GFR, Estimated: 9 mL/min — ABNORMAL LOW (ref >60)
Glucose, Bld: 82 mg/dL (ref 70–99)
Potassium: 3.8 mmol/L (ref 3.5–5.1)
Sodium: 135 mmol/L (ref 135–145)
Total Bilirubin: 0.8 mg/dL (ref 0.3–1.2)
Total Protein: 5.1 g/dL — ABNORMAL LOW (ref 6.5–8.1)

## 2022-10-24 LAB — CBC WITH DIFFERENTIAL/PLATELET
Abs Immature Granulocytes: 0.03 10*3/uL (ref 0.00–0.07)
Basophils Absolute: 0 10*3/uL (ref 0.0–0.1)
Basophils Relative: 0 %
Eosinophils Absolute: 0.5 10*3/uL (ref 0.0–0.5)
Eosinophils Relative: 8 %
HCT: 24.7 % — ABNORMAL LOW (ref 36.0–46.0)
Hemoglobin: 7.3 g/dL — ABNORMAL LOW (ref 12.0–15.0)
Immature Granulocytes: 0 %
Lymphocytes Relative: 8 %
Lymphs Abs: 0.5 10*3/uL — ABNORMAL LOW (ref 0.7–4.0)
MCH: 27.2 pg (ref 26.0–34.0)
MCHC: 29.6 g/dL — ABNORMAL LOW (ref 30.0–36.0)
MCV: 92.2 fL (ref 80.0–100.0)
Monocytes Absolute: 0.3 10*3/uL (ref 0.1–1.0)
Monocytes Relative: 5 %
Neutro Abs: 5.3 10*3/uL (ref 1.7–7.7)
Neutrophils Relative %: 79 %
Platelets: 118 10*3/uL — ABNORMAL LOW (ref 150–400)
RBC: 2.68 MIL/uL — ABNORMAL LOW (ref 3.87–5.11)
RDW: 19.7 % — ABNORMAL HIGH (ref 11.5–15.5)
WBC: 6.7 10*3/uL (ref 4.0–10.5)
nRBC: 0 % (ref 0.0–0.2)

## 2022-10-24 LAB — CBG MONITORING, ED
Glucose-Capillary: 95 mg/dL (ref 70–99)
Glucose-Capillary: 102 mg/dL — ABNORMAL HIGH (ref 70–99)
Glucose-Capillary: 89 mg/dL (ref 70–99)

## 2022-10-24 LAB — MAGNESIUM: Magnesium: 1.7 mg/dL (ref 1.7–2.4)

## 2022-10-24 LAB — T4, FREE: Free T4: 0.95 ng/dL (ref 0.61–1.12)

## 2022-10-24 LAB — RENAL FUNCTION PANEL
Albumin: 2.7 g/dL — ABNORMAL LOW (ref 3.5–5.0)
Anion gap: 11 (ref 5–15)
BUN: 35 mg/dL — ABNORMAL HIGH (ref 8–23)
CO2: 26 mmol/L (ref 22–32)
Calcium: 7.7 mg/dL — ABNORMAL LOW (ref 8.9–10.3)
Chloride: 96 mmol/L — ABNORMAL LOW (ref 98–111)
Creatinine, Ser: 4.87 mg/dL — ABNORMAL HIGH (ref 0.44–1.00)
GFR, Estimated: 9 mL/min — ABNORMAL LOW (ref >60)
Glucose, Bld: 88 mg/dL (ref 70–99)
Phosphorus: 3.7 mg/dL (ref 2.5–4.6)
Potassium: 3.8 mmol/L (ref 3.5–5.1)
Sodium: 133 mmol/L — ABNORMAL LOW (ref 135–145)

## 2022-10-24 LAB — PREPARE RBC (CROSSMATCH)

## 2022-10-24 LAB — SEDIMENTATION RATE: Sed Rate: 45 mm/hr — ABNORMAL HIGH (ref 0–22)

## 2022-10-24 LAB — PHOSPHORUS: Phosphorus: 3.8 mg/dL (ref 2.5–4.6)

## 2022-10-24 LAB — HEPATITIS B SURFACE ANTIGEN: Hepatitis B Surface Ag: NONREACTIVE

## 2022-10-24 LAB — TSH: TSH: 2.455 u[IU]/mL (ref 0.350–4.500)

## 2022-10-24 MED ORDER — SODIUM CHLORIDE 0.9 % IV SOLN
12.5000 mg | Freq: Once | INTRAVENOUS | Status: AC | PRN
  Administered 2022-10-29: 12.5 mg via INTRAVENOUS
  Filled 2022-10-24: qty 12.5
  Filled 2022-10-24: qty 0.5

## 2022-10-24 MED ORDER — MIRTAZAPINE 15 MG PO TABS
15.0000 mg | ORAL_TABLET | Freq: Every day | ORAL | Status: DC
  Administered 2022-10-24 – 2022-11-05 (×7): 15 mg via ORAL
  Filled 2022-10-24 (×9): qty 1

## 2022-10-24 MED ORDER — CARBIDOPA-LEVODOPA 25-100 MG PO TABS
2.0000 | ORAL_TABLET | Freq: Every morning | ORAL | Status: DC
  Administered 2022-10-26 – 2022-11-06 (×7): 2 via ORAL
  Filled 2022-10-24 (×11): qty 2

## 2022-10-24 MED ORDER — MIDODRINE HCL 5 MG PO TABS: 20.0000 mg | ORAL_TABLET | ORAL | Status: DC

## 2022-10-24 MED ORDER — LIDOCAINE HCL (PF) 1 % IJ SOLN: 5.0000 mL | INTRAMUSCULAR | Status: DC | PRN

## 2022-10-24 MED ORDER — LIDOCAINE-PRILOCAINE 2.5-2.5 % EX CREA: 1.0000 | TOPICAL_CREAM | CUTANEOUS | Status: DC | PRN

## 2022-10-24 MED ORDER — PENTAFLUOROPROP-TETRAFLUOROETH EX AERO
INHALATION_SPRAY | CUTANEOUS | Status: AC
  Filled 2022-10-24: qty 30

## 2022-10-24 MED ORDER — HEPARIN SODIUM (PORCINE) 1000 UNIT/ML DIALYSIS
1000.0000 [IU] | INTRAMUSCULAR | Status: DC | PRN
  Administered 2022-10-29: 3000 [IU] via INTRAVENOUS_CENTRAL
  Filled 2022-10-24: qty 1

## 2022-10-24 MED ORDER — BUPRENORPHINE 10 MCG/HR TD PTWK
1.0000 | MEDICATED_PATCH | TRANSDERMAL | Status: DC
  Administered 2022-10-31 – 2022-11-07 (×2): 1 via TRANSDERMAL
  Filled 2022-10-24 (×2): qty 1

## 2022-10-24 MED ORDER — SODIUM CHLORIDE 0.9% IV SOLUTION: Freq: Once | INTRAVENOUS | Status: DC

## 2022-10-24 MED ORDER — HEPARIN SODIUM (PORCINE) 1000 UNIT/ML IJ SOLN
2500.0000 [IU] | Freq: Once | INTRAMUSCULAR | Status: AC
  Administered 2022-10-24: 2500 [IU] via INTRAVENOUS
  Filled 2022-10-24: qty 2.5
  Filled 2022-10-24: qty 3

## 2022-10-24 MED ORDER — CHLORHEXIDINE GLUCONATE CLOTH 2 % EX PADS
6.0000 | MEDICATED_PAD | Freq: Every day | CUTANEOUS | Status: DC
  Administered 2022-10-26 – 2022-11-06 (×6): 6 via TOPICAL

## 2022-10-24 MED ORDER — HYDROXYZINE HCL 25 MG PO TABS
50.0000 mg | ORAL_TABLET | Freq: Two times a day (BID) | ORAL | Status: DC | PRN
  Filled 2022-10-24: qty 2

## 2022-10-24 MED ORDER — ONDANSETRON HCL 4 MG/2ML IJ SOLN
4.0000 mg | Freq: Four times a day (QID) | INTRAMUSCULAR | Status: DC | PRN
  Administered 2022-10-24 – 2022-11-06 (×16): 4 mg via INTRAVENOUS
  Filled 2022-10-24 (×16): qty 2

## 2022-10-24 MED ORDER — CARBIDOPA-LEVODOPA 25-100 MG PO TABS: 1.0000 | ORAL_TABLET | ORAL | Status: DC

## 2022-10-24 MED ORDER — PENTAFLUOROPROP-TETRAFLUOROETH EX AERO: 1.0000 | INHALATION_SPRAY | CUTANEOUS | Status: DC | PRN

## 2022-10-24 MED ORDER — CARBIDOPA-LEVODOPA 25-100 MG PO TABS
1.0000 | ORAL_TABLET | Freq: Two times a day (BID) | ORAL | Status: DC
  Administered 2022-10-24 – 2022-11-06 (×14): 1 via ORAL
  Filled 2022-10-24 (×15): qty 1

## 2022-10-24 MED ORDER — TAMSULOSIN HCL 0.4 MG PO CAPS
0.4000 mg | ORAL_CAPSULE | Freq: Every day | ORAL | Status: DC
  Administered 2022-10-24 – 2022-11-01 (×7): 0.4 mg via ORAL
  Filled 2022-10-24 (×7): qty 1

## 2022-10-24 MED ORDER — MIDODRINE HCL 5 MG PO TABS
20.0000 mg | ORAL_TABLET | ORAL | Status: DC
  Administered 2022-10-24 – 2022-11-01 (×3): 20 mg via ORAL
  Filled 2022-10-24 (×4): qty 4

## 2022-10-24 MED ORDER — INSULIN ASPART 100 UNIT/ML IJ SOLN: 0.0000 [IU] | Freq: Every day | INTRAMUSCULAR | Status: DC

## 2022-10-24 MED ORDER — MELATONIN 5 MG PO TABS
5.0000 mg | ORAL_TABLET | Freq: Every day | ORAL | Status: DC
  Administered 2022-10-24 – 2022-11-06 (×7): 5 mg via ORAL
  Filled 2022-10-24 (×9): qty 1

## 2022-10-24 MED ORDER — ACETAMINOPHEN 325 MG PO TABS
650.0000 mg | ORAL_TABLET | Freq: Four times a day (QID) | ORAL | Status: DC | PRN
  Administered 2022-11-01: 650 mg via ORAL
  Filled 2022-10-24: qty 2

## 2022-10-24 MED ORDER — ANTICOAGULANT SODIUM CITRATE 4% (200MG/5ML) IV SOLN: 5.0000 mL | Status: DC | PRN

## 2022-10-24 MED ORDER — ATORVASTATIN CALCIUM 10 MG PO TABS
20.0000 mg | ORAL_TABLET | Freq: Every day | ORAL | Status: DC
  Administered 2022-10-24 – 2022-10-27 (×3): 20 mg via ORAL
  Filled 2022-10-24 (×3): qty 2

## 2022-10-24 MED ORDER — ALBUTEROL SULFATE (2.5 MG/3ML) 0.083% IN NEBU: 2.5000 mg | INHALATION_SOLUTION | Freq: Four times a day (QID) | RESPIRATORY_TRACT | Status: DC | PRN

## 2022-10-24 MED ORDER — NYSTATIN 100000 UNIT/GM EX POWD
1.0000 | Freq: Every day | CUTANEOUS | Status: DC
  Administered 2022-10-25 – 2022-11-06 (×10): 1 via TOPICAL
  Filled 2022-10-24 (×2): qty 15

## 2022-10-24 MED ORDER — HEPARIN SODIUM (PORCINE) 1000 UNIT/ML IJ SOLN
3000.0000 [IU] | Freq: Once | INTRAMUSCULAR | Status: DC
  Filled 2022-10-24 (×2): qty 3

## 2022-10-24 MED ORDER — ACETAMINOPHEN 650 MG RE SUPP: 650.0000 mg | Freq: Four times a day (QID) | RECTAL | Status: DC | PRN

## 2022-10-24 MED ORDER — INSULIN ASPART 100 UNIT/ML IJ SOLN: 0.0000 [IU] | Freq: Three times a day (TID) | INTRAMUSCULAR | Status: DC

## 2022-10-24 MED ORDER — PANTOPRAZOLE SODIUM 40 MG PO TBEC
40.0000 mg | DELAYED_RELEASE_TABLET | Freq: Two times a day (BID) | ORAL | Status: DC
  Administered 2022-10-24 – 2022-10-28 (×8): 40 mg via ORAL
  Filled 2022-10-24 (×9): qty 1

## 2022-10-24 MED ORDER — FLUTICASONE PROPIONATE 50 MCG/ACT NA SUSP
1.0000 | Freq: Two times a day (BID) | NASAL | Status: DC | PRN
  Administered 2022-11-02: 1 via NASAL
  Filled 2022-10-24: qty 16

## 2022-10-24 MED ORDER — ONDANSETRON HCL 4 MG PO TABS
4.0000 mg | ORAL_TABLET | Freq: Four times a day (QID) | ORAL | Status: DC | PRN
  Administered 2022-10-29: 4 mg via ORAL
  Filled 2022-10-24: qty 1

## 2022-10-24 MED ORDER — GABAPENTIN 300 MG PO CAPS
300.0000 mg | ORAL_CAPSULE | Freq: Every day | ORAL | Status: DC
  Administered 2022-10-24 – 2022-10-29 (×4): 300 mg via ORAL
  Filled 2022-10-24 (×5): qty 1

## 2022-10-24 MED ORDER — DEXTROSE 50 % IV SOLN
1.0000 | INTRAVENOUS | Status: DC | PRN
  Administered 2022-10-29 – 2022-11-03 (×4): 50 mL via INTRAVENOUS
  Filled 2022-10-24 (×4): qty 50

## 2022-10-24 MED ORDER — SODIUM CHLORIDE 0.9% FLUSH
3.0000 mL | Freq: Two times a day (BID) | INTRAVENOUS | Status: DC
  Administered 2022-10-24 – 2022-11-06 (×25): 3 mL via INTRAVENOUS

## 2022-10-24 MED ORDER — CAMPHOR-MENTHOL 0.5-0.5 % EX LOTN
1.0000 | TOPICAL_LOTION | Freq: Every day | CUTANEOUS | Status: DC
  Administered 2022-10-26 – 2022-11-09 (×13): 1 via TOPICAL
  Filled 2022-10-24 (×2): qty 222

## 2022-10-24 MED ORDER — ROPINIROLE HCL ER 4 MG PO TB24
12.0000 mg | ORAL_TABLET | Freq: Every day | ORAL | Status: DC
  Administered 2022-10-24 – 2022-11-06 (×8): 12 mg via ORAL
  Filled 2022-10-24 (×18): qty 3

## 2022-10-24 MED ORDER — SIMETHICONE 80 MG PO CHEW
125.0000 mg | CHEWABLE_TABLET | Freq: Four times a day (QID) | ORAL | Status: DC | PRN
  Administered 2022-11-02: 120 mg via ORAL
  Filled 2022-10-24: qty 2

## 2022-10-24 MED ORDER — CALCITRIOL 0.25 MCG PO CAPS
1.2500 ug | ORAL_CAPSULE | ORAL | Status: DC
  Administered 2022-10-24: 1.25 ug via ORAL
  Filled 2022-10-24: qty 1
  Filled 2022-10-24: qty 5

## 2022-10-24 MED ORDER — ALTEPLASE 2 MG IJ SOLR: 2.0000 mg | Freq: Once | INTRAMUSCULAR | Status: DC | PRN

## 2022-10-24 MED ORDER — HEPARIN SODIUM (PORCINE) 1000 UNIT/ML DIALYSIS: 1000.0000 [IU] | INTRAMUSCULAR | Status: DC | PRN

## 2022-10-24 NOTE — Progress Notes (Signed)
VASCULAR LAB    Bilateral lower extremity venous duplex has been performed.  See CV proc for preliminary results.   Lakita Sahlin, RVT 10/24/2022, 10:27 AM

## 2022-10-24 NOTE — Progress Notes (Signed)
Correct however she is now admitted to [facility] and likely will have EGD and colonoscopy or flex sig with SA as an inpatient.

## 2022-10-24 NOTE — Telephone Encounter (Signed)
Pt currently at [facility] to reach anyone at Facility or leave message:

## 2022-10-24 NOTE — Consult Note (Addendum)
[facility] Consult: 11:30 AM 10/24/2022  LOS: 0 days    Referring Provider: Dr Harvest Forest  Primary Care Physician: APP at her nursing facility. Primary Gastroenterologist:  Dr. Fuller Plan.     Reason for Consultation: Nausea, vomiting and ongoing diverticulitis.   HPI: Jean Mcclain is a 77 y.o. female.  Multiple medical problems listed below.  On Eliquis for hx DVT 2018.  IDDM, A1c 6.9 in 07/2022.  Obesity.  Parkinson's.  ESRD on HD MWF. No previous EGD or colonoscopy.    Initial inpt GI evaluation w Dr. Fuller Plan in October 2023 for episodic nausea, vomiting, food aversion, weight loss from 235 to 150 pounds over a year.  07/21/2022 CTAP w contrast confirmed sigmoid diverticulitis with 2.5 cm intramural abscess.  Treated with Rocephin/Flagyl and then completed course of Augmentin.  Endorsed copious clear nasal secretions a lot of which she would swallow.  Dr. Fuller Plan canceled planned EGD after symptoms resolved with scheduled Zofran, Protonix 40 mg daily. Pt received 1 prbc for Hgb 6.8, MCV 85 (10.5 and 96 six m prior).   At 08/30/2022 GI NP office follow-up the upper GI symptoms continued resolved.  Occasional mild mid abdominal discomfort, nothing significant. However upper GI symptoms recurred in early January, despite scheduled Reglan 10 mg po every 6 hours and Zofran prn, Protonix.  She also has a buprenorphine patch weekly for pain management.  Colonoscopy set for 11/03/2022 at [facility] though not clear she can tolerate prep and may just pursue EGD (not officially  scheduled).  Nausea w some non-bloody emesis has continued leading to repeat CT imaging x 3.  Treated w another 10d Augmentin after 12/7 CT, this led to diarrhea.  No additional abx since then. Persistent postnasal drip, nasal congestion.  She says Flonase worsened  the nasal drip.     09/15/2022 CTAP w contrast: Still showing sigmoid diverticulitis with substantial posterior wall thickening, gas tracking along posterior wall of colon, possibly representing microperforation or small pericolic abscess.  However appearance was similar to the October CT.  Given the appearance of the changes in the colon were persistent, possibility of malignancy was not excluded.  Lesions in the ascending colon suspicious for fatty polyps 1 of which measured 4.1 cm in length.  Non GI findings included small pleural effusions, atelectasis, mitral and aortic valve calcifications, cardiomegaly.  Cyst versus mass in the right kidney, renal atrophy and bilateral cysts.  Enlarging left adnexal cyst.  Arthropathy in the hips.  Spondylosis, degenerative disc disease multilevel.  Benign appearing exophytic, right adnexal cyst.  Aortic atherosclerosis. 09/29/22 CTAP w contrast: Mild sigmoid diverticulitis, stable.  No drainable fluid collection/abscess.  No free air.  Bilateral pleural effusions and lower lobe atelectasis. 10/20/22 CTAP w contrast: Mild acute diverticulitis at junction descending/sigmoid.  No abscess, no perforation.  Appearance stable compared with recent CT imaging.  Persistent pleural effusions and atelectasis.  Indeterminate right kidney lesion, MRI recommended for further eval.  Benign left adrenal adenoma.  Simple appearing cyst at left adnexa.  Aortic atherosclerosis  Transported from SNF to ED with  reports of shaking, jerking which had been progressive since Thursday vs Friday, 4 to 5 days ago. Ongoing GI and ENT issues, loose/non-bloody/non melenic stool on 1/15.   Last dialysis session was on Wednesday, she missed Friday session because she was in the ED.  10/24/22 CT head:   Stable, normal for age cerebral volume and gray-white matter differentiation.  Small congenital intracranial lipoma.  Calcified atherosclerosis at base of skull. LFTS normal.  Lipase 30 on 10/19/22.   Albumin 2.7.  GFR 9.   Hgb 9... 7.3, MCV 92.  Platelets 118.    Plan for HD today along w 1 PRBC.    Lives at [facility], [place].  Quit smoking in the 1980s.  Does not consume alcoholic beverages.  Her brother Annett Gula is her [facility] POA.  Currently he is recovering from some sort of surgery at home. Family history negative for colorectal cancer, ulcer disease, swallowing issues, esophageal cancer.  No history pancreatic or liver problems.    Past Medical History:  Diagnosis Date   Acute and chronic respiratory failure (acute-on-chronic) (HCC)    Acute deep vein thrombosis (DVT) of popliteal vein of left lower extremity ([place]) 03/11/2017   Anxiety    Arthritis    osteo   Diabetes mellitus, type 2 (HCC)    Type II   DVT (deep venous thrombosis) ([place]) 03/2017   left popliteal   ESRD on hemodialysis (HCC)    Facet joint disease    foot   Fibula fracture 02/2017   left   GERD (gastroesophageal reflux disease)    H/O blood clots    Hyperlipidemia    Hypertension    Macrocytic anemia    Morbid obesity (HCC)    OSA (obstructive sleep apnea)    does not use CPAP    Osteopenia    Renal disorder    MWF dialysis   Supplemental oxygen dependent    Venous stasis ulcers ([place])    Vitamin D deficiency     Past Surgical History:  Procedure Laterality Date   ANKLE FUSION Left 06/24/2020   Procedure: LEFT TIBIOCALCANEAL FUSION;  Surgeon: [name], MD;  Location: [facility];  Service: Orthopedics;  Laterality: Left;   AV FISTULA PLACEMENT Right 08/20/2018   Procedure: ARTERIOVENOUS (AV) FISTULA CREATION BRACHIOCEPHALIC;  Surgeon: Angelia Mould, MD;  Location: [facility];  Service: Vascular;  Laterality: Right;   BREAST SURGERY Right 03/1997   biospy   CARPAL TUNNEL RELEASE Left 2016   HYSTEROSCOPY WITH D & C N/A 09/29/2020   Procedure: DILATATION AND CURETTAGE /HYSTEROSCOPY;  Surgeon: Mora Bellman, MD;  Location: [facility];  Service: Gynecology;  Laterality: N/A;   INSERTION OF MESH  N/A 01/08/2013   Procedure: INSERTION OF MESH;  Surgeon: Earnstine Regal, MD;  Location: [facility];  Service: General;  Laterality: N/A;   IR THORACENTESIS ASP PLEURAL SPACE W/IMG GUIDE  04/06/2017   JOINT REPLACEMENT Right 1999   TOTAL KNEE ARTHROPLASTY Left 1999   Left   TRIGGER FINGER RELEASE Left 11/2014   TRIGGER FINGER RELEASE Right    VENTRAL HERNIA REPAIR N/A 01/08/2013   Procedure: LAPAROSCOPIC VENTRAL HERNIA;  Surgeon: Earnstine Regal, MD;  Location: [facility];  Service: General;  Laterality: N/A;    Prior to Admission medications   Medication Sig Start Date End Date Taking? Authorizing Provider  acetaminophen (TYLENOL) 500 MG tablet Take 2 tablets (1,000 mg total) by mouth 3 (three) times daily. Patient taking differently: Take 1,000 mg by mouth  3 (three) times daily as needed (pain). 01/14/22  Yes Dahal, Marlowe Aschoff, MD  albuterol (VENTOLIN HFA) 108 (90 Base) MCG/ACT inhaler Inhale 2 puffs into the lungs every 6 (six) hours as needed for wheezing or shortness of breath.   Yes [provider]  atorvastatin (LIPITOR) 20 MG tablet Take 20 mg by mouth daily.   Yes [provider]  buprenorphine (BUTRANS) 10 MCG/HR [place] 1 patch onto the skin once a week.   Yes [provider]  carbidopa-levodopa (SINEMET IR) 25-100 MG tablet Take 1-2 tablets by mouth See admin instructions. 2 entries on MAR: Take 2 tablets by mouth every morning. Take 1 tablet by mouth twice daily.   Yes [provider]  docusate sodium (COLACE) 100 MG capsule Take 200 mg by mouth at bedtime. Hold for loose stools   Yes [provider]  fluticasone (FLONASE) 50 MCG/ACT nasal spray Place 1 spray into both nostrils 2 (two) times daily as needed for allergies.   Yes [provider]  gabapentin (NEURONTIN) 300 MG capsule Take 300 mg by mouth at bedtime.   Yes [provider]  Glycerin, Laxative, (GLYCERIN, ADULT,) 2 g SUPP Place 1 suppository rectally daily as needed for  moderate constipation. 07/28/22  Yes Vann, Jessica U, DO  hydrOXYzine (ATARAX) 50 MG tablet Take 50 mg by mouth 2 (two) times daily as needed (no indication given on MAR).   Yes [provider]  insulin aspart (NOVOLOG FLEXPEN) 100 UNIT/ML FlexPen Inject 3-9 Units into the skin See admin instructions. Inject 3-9 units before meals and at bedtime per sliding scale: BS 70-100 : 3 units BS 101-150: 4 units BS 151-200: 5 units BS 201-250: 6 units BS 251-300: 7 units BS 301-350: 8 units BS 351-400: 9 units BS > 400 : call 911 HOLD on M-W-F when at dialysis   Yes [provider]  loperamide (IMODIUM A-D) 2 MG tablet Take 2 mg by mouth 2 (two) times daily as needed for diarrhea or loose stools.   Yes [provider]  melatonin 5 MG TABS Take 5 mg by mouth at bedtime.   Yes [provider]  metoCLOPramide (REGLAN) 10 MG tablet Take 1 tablet (10 mg total) by mouth every 6 (six) hours. 09/29/22  Yes Phyllis Ginger, MD  midodrine (PROAMATINE) 10 MG tablet Take 10-20 mg by mouth See admin instructions. 20 mg every Monday, Wednesday, Friday prior to Hemodialysis and 10 mg half-way through hemodialysis.   Yes [provider]  mirtazapine (REMERON) 15 MG tablet Take 15 mg by mouth at bedtime.   Yes [provider]  nystatin [facility]) powder Apply 1 Application topically daily. To groin and under breasts Kept in room, pt self applies   Yes [provider]  ondansetron (ZOFRAN-ODT) 8 MG disintegrating tablet Take 1 tablet (8 mg total) by mouth every 6 (six) hours. Scheduled while on abx and then PRN after abx complete Patient taking differently: Take 8 mg by mouth every 6 (six) hours as needed for nausea or vomiting. 07/28/22  Yes Vann, Jessica U, DO  OXYGEN Inhale 3 L into the lungs at bedtime as needed (SOB).   Yes [provider]  pantoprazole (PROTONIX) 40 MG tablet Take 1 tablet (40 mg total) by mouth 2 (two) times daily. BID for 14 days  and then daily Patient taking differently: Take 40 mg by mouth daily. 07/28/22  Yes Vann, Jessica U, DO  polyethylene glycol powder (GLYCOLAX/MIRALAX) 17 GM/SCOOP powder Take 17 g  by mouth daily as needed (constipation).   Yes [provider]  pramoxine (SARNA SENSITIVE) 1 % LOTN Apply 1 Application topically daily. Kept in room, pt self applies   Yes [provider]  promethazine (PHENERGAN) 12.5 MG suppository Place 1 suppository (12.5 mg total) rectally every 6 (six) hours as needed for nausea or vomiting. 07/28/22  Yes Eulogio Bear U, DO  Ropinirole HCl 12 MG TB24 Take 12 mg by mouth daily.   Yes [provider]  Simethicone (GAS-X EXTRA STRENGTH PO) Take 1 tablet by mouth every 12 (twelve) hours as needed (heartburn, indigestion).   Yes [provider]  tamsulosin (FLOMAX) 0.4 MG CAPS capsule Take 1 capsule (0.4 mg total) by mouth daily. Patient taking differently: Take 0.4 mg by mouth at bedtime. 01/16/22  Yes Dahal, Marlowe Aschoff, MD  doxercalciferol (HECTOROL) 4 MCG/2ML injection Inject 1 mL (2 mcg total) into the vein every Monday, Wednesday, and Friday with hemodialysis. Patient not taking: Reported on 10/24/2022 01/27/20   [place] Donnamarie Poag, DO  insulin aspart (NOVOLOG) 100 UNIT/ML injection Inject 0-5 Units into the skin at bedtime. Patient not taking: Reported on 10/24/2022 01/14/22   Terrilee Croak, MD  Na Sulfate-K Sulfate-Mg Sulf 17.5-3.13-1.6 GM/177ML SOLN Refer to Instruction sent to you for your colonoscopy Patient not taking: Reported on 10/24/2022 09/19/22   Ladene Artist, MD    Scheduled Meds:  sodium chloride   Intravenous Once   calcitRIOL  1.25 mcg Oral Q M,W,F-HD   Chlorhexidine Gluconate Cloth  6 each Topical Q0600   insulin aspart  0-5 Units Subcutaneous QHS   insulin aspart  0-6 Units Subcutaneous TID WC   pantoprazole  40 mg Oral BID   sodium chloride flush  3 mL Intravenous Q12H   Infusions:  anticoagulant sodium citrate     PRN  Meds: acetaminophen **OR** acetaminophen, albuterol, alteplase, anticoagulant sodium citrate, dextrose, heparin, heparin, lidocaine (PF), lidocaine-prilocaine, ondansetron **OR** ondansetron (ZOFRAN) IV, pentafluoroprop-tetrafluoroeth   Allergies as of 10/24/2022 - Review Complete 10/24/2022  Allergen Reaction Noted   Ms contin [morphine] Nausea Only 05/24/2022    Family History  Problem Relation Age of Onset   Leukemia Mother    Hypertension Brother    Emphysema Father     Social History   Socioeconomic History   Marital status: Single    Spouse name: Not on file   Number of children: Not on file   Years of education: Not on file   Highest education level: Not on file  Occupational History   Not on file  Tobacco Use   Smoking status: Former    Years: 27.00    Types: Cigarettes    Quit date: 10/10/1981    Years since quitting: 41.0   Smokeless tobacco: Never  Vaping Use   Vaping Use: Never used  Substance and Sexual Activity   Alcohol use: No   Drug use: No   Sexual activity: Not on file  Other Topics Concern   Not on file  Social History Narrative   ** Merged History Encounter **       Social Determinants of Health   Financial Resource Strain: Not on file  Food Insecurity: Food Insecurity Present (07/22/2022)   Hunger Vital Sign    Worried About Estate manager/land agent of Food in the Last Year: Often true    Ran Out of Food in the Last Year: Often true  Transportation Needs: No Transportation Needs (07/22/2022)   PRAPARE - Hydrologist (  Medical): No    Lack of Transportation (Non-Medical): No  Physical Activity: Not on file  Stress: Not on file  Social Connections: Not on file  Intimate Partner Violence: Not At Risk (07/22/2022)   Humiliation, Afraid, Rape, and Kick questionnaire    Fear of Current or Ex-Partner: No    Emotionally Abused: No    Physically Abused: No    Sexually Abused: No    REVIEW OF SYSTEMS: Constitutional: Some  weakness, mild fatigue ENT:  No nose bleeds Pulm: For a few weeks has been needing to use oxygen continually, previously it was as needed. CV:  No palpitations, no LE edema.  No angina GU:  No hematuria, no frequency.  No oliguria. GI: See HPI. Heme: Denies unusual or excessive bleeding or bruising. Transfusions: As per HPI. Neuro: Limb and trunk jerking in recent days.  No headaches, no peripheral tingling or numbness.  No seizures, no syncope. Derm:  No itching, no rash or sores.  Endocrine:  No sweats or chills.  No polyuria or dysuria Immunization: Reviewed Travel:  None     PHYSICAL EXAM: Vital signs in last 24 hours: Vitals:   10/24/22 1030 10/24/22 1100  BP: (!) 123/55 (!) 126/59  Pulse: 67 69  Resp:  18  Temp:    SpO2: 96% 100%   Wt Readings from Last 3 Encounters:  10/19/22 70.8 kg  09/29/22 70.8 kg  08/30/22 70.8 kg    General: Patient is pleasant, gregarious, looks chronically ill and pale. Head: No signs of head trauma.  No facial asymmetry or swelling. Eyes: No scleral icterus or conjunctival pallor. Ears: Not hard of hearing Nose: No sniffling or signs of congestion. Mouth: Mucosa is pink, clear, somewhat dry.  Tongue is midline. Neck: No JVD, no masses, no thyromegaly Lungs: Clear bilaterally with mildly reduced breath sounds throughout.  No adventitious sounds.  No cough or labored breathing. Heart: RRR.  No MRG.  S1, S2 present. Abdomen: Soft, not tender or distended.  No HSM, masses, bruits, hernias.  Active bowel sounds.   Rectal: Deferred Musc/Skeltl: No joint redness, swelling or gross deformity. Extremities: No CCE.  Dialysis access graft RUE. Neurologic: Oriented x 3.  Moves all 4 limbs, strength not tested.  No tremors, jerking during the 20-minute encounter. Skin: No rash, no sores, no telangiectasia.  No significant purpura or bruising. Nodes: No cervical adenopathy Psych: Gregarious, pleasant, slightly anxious.  Fluid speech.  Intake/Output  from previous day: No intake/output data recorded. Intake/Output this shift: No intake/output data recorded.  LAB RESULTS: Recent Labs    10/24/22 0404  WBC 6.7  HGB 7.3*  HCT 24.7*  PLT 118*   BMET Lab Results  Component Value Date   NA 135 10/24/2022   NA 137 10/19/2022   NA 137 09/29/2022   K 3.8 10/24/2022   K 4.1 10/19/2022   K 3.4 (L) 09/29/2022   CL 96 (L) 10/24/2022   CL 97 (L) 10/19/2022   CL 95 (L) 09/29/2022   CO2 27 10/24/2022   CO2 30 10/19/2022   CO2 32 09/29/2022   GLUCOSE 82 10/24/2022   GLUCOSE 96 10/19/2022   GLUCOSE 91 09/29/2022   BUN 34 (H) 10/24/2022   BUN 10 10/19/2022   BUN 5 (L) 09/29/2022   CREATININE 4.74 (H) 10/24/2022   CREATININE 2.03 (H) 10/19/2022   CREATININE 3.18 (H) 09/29/2022   CALCIUM 7.8 (L) 10/24/2022   CALCIUM 8.5 (L) 10/19/2022   CALCIUM 8.5 (L) 09/29/2022   LFT Recent  Labs    10/24/22 0404  PROT 5.1*  ALBUMIN 2.7*  AST 7*  ALT 5  ALKPHOS 96  BILITOT 0.8   PT/INR Lab Results  Component Value Date   INR 1.3 (H) 07/23/2022   INR 1.5 (H) 01/09/2022   INR 1.65 03/30/2017   Hepatitis Panel No results for input(s): "HEPBSAG", "HCVAB", "HEPAIGM", "HEPBIGM" in the last 72 hours. C-Diff No components found for: "CDIFF" Lipase     Component Value Date/Time   LIPASE 30 10/19/2022 2127    Drugs of Abuse  No results found for: "LABOPIA", "COCAINSCRNUR", "LABBENZ", "AMPHETMU", "THCU", "LABBARB"   RADIOLOGY STUDIES: CT Head Wo Contrast  Result Date: 10/24/2022 CLINICAL DATA:  77 year old female with jerking, shaking. EXAM: CT HEAD WITHOUT CONTRAST TECHNIQUE: Contiguous axial images were obtained from the base of the skull through the vertex without intravenous contrast. RADIATION DOSE REDUCTION: This exam was performed according to the departmental dose-optimization program which includes automated exposure control, adjustment of the mA and/or kV according to patient size and/or use of iterative reconstruction  technique. COMPARISON:  Brain MRI 07/21/2022.  Head CT 07/03/2020. FINDINGS: Brain: Cerebral volume is not significantly changed since 2021, within normal limits for age. 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 No midline shift, ventriculomegaly, mass effect, evidence of mass lesion, intracranial hemorrhage or evidence of cortically based acute infarction. Gray-white matter differentiation is stable and within normal limits for age. Vascular: Calcified atherosclerosis at the skull base. No suspicious intracranial vascular hyperdensity. Skull: No acute osseous abnormality identified. Sinuses/Orbits: Visualized paranasal sinuses and mastoids are stable and well aerated. Other: No acute orbit or scalp soft tissue finding. IMPRESSION: Stable and normal for age non contrast CT appearance of the brain. Electronically Signed   By: Genevie Ann M.D.   On: 10/24/2022 05:18   DG Chest Port 1 View  Result Date: 10/24/2022 CLINICAL DATA:  77 year old female missed dialysis. EXAM: PORTABLE CHEST 1 VIEW COMPARISON:  Portable chest 10/19/2022 and earlier. FINDINGS: Portable AP semi upright view at 0400 hours. Stable lung volumes. Stable cardiac size and mediastinal contours. On going left lung base hypo ventilation which appears in part related to small pleural effusion. Increased right lung and lung base veiling opacity and blunting of the right costophrenic angle. No superimposed pneumothorax, pulmonary edema, or consolidation. Paucity of bowel gas in the visible abdomen. Stable visualized osseous structures. Both glenohumeral joints are chronically dislocated since at least 07/21/2022. IMPRESSION: 1. Small bilateral pleural effusions, increased on the right since 10/19/2022. 2. No other acute cardiopulmonary abnormality. 3. Chronic bilateral glenohumeral joint dislocations. Electronically Signed   By: Genevie Ann M.D.   On: 10/24/2022 04:31     IMPRESSION:   Chronic nausea.   Food aversion, anorexia.  Weight loss.  Symptoms persist despite metoclopramide, PPI, antiemetics.  Could have diabetic gastroparesis or slow motility due to chronic topical opioid (Butrans patch).  Ulcer disease, gastritis less likely given chronic PPI.  ?  If related to excessive postnasal drip, would ENT evaluation be helpful?  Persistent sigmoid diverticulitis dating back to October 2023, 4 months ago.  At presentation in October and since then, she is not experiencing abdominal pain.  No previous colonoscopy.  Colonoscopy set up for later this month on January 25 though not clear she is going to be able to prep for this procedure.  Parkinson's disease.  Several days of increased jerking, is this side effect of Reglan versus due to missing dialysis session on Friday?  Head CT nonacute.  ESRD.  Missed 1 HD session on 1/12.  HD planned for this afternoon.  Chronic anemia.  Hb now 7.3.  No overt bleeding.  PRBC x 1 planned during HD.    IDDM with good recent hemoglobin A1c's.  Patient reports recent blood sugars in the 60s and 70s at SNF    Episode of diarrhea, not chronic or prolonged. Hx antiobiotic associated diarrhea that resolved after finishing abx.  Stool C diff, FOBT and PCR path panel ordered by attending MD   PLAN:     EGD tomorrow, patient agreeable.  Needs colonoscopy but she will be able to tolerate prep at present so defer this to the future.  Changed clear liquid diet to full liquids.  She was strongly advocating for pot roast which she recalls fondly from her admission in October.  However I said we needed to wait before advancing her to solids.     Azucena Freed  10/24/2022, 11:30 AM Phone [phone]

## 2022-10-24 NOTE — ED Notes (Signed)
Per Dr Tamala Julian the blood can be given when pt goes the dialysis

## 2022-10-24 NOTE — H&P (Signed)
History and Physical    Patient: Jean Mcclain GBT:[id number] DOB: 1946/08/21 DOA: 10/24/2022 DOS: the patient was seen and examined on 10/24/2022 PCP: Pcp, No  Patient coming from: [facility] via EMS  Chief Complaint:  Chief Complaint  Patient presents with   Shaking   HPI: Jean Mcclain is a 77 y.o. female with medical history significant of hypertension, hyperlipidemia, diabetes mellitus type II, ESRD on HD(M/W/F), chronic respiratory failure with hypoxia on 3 L of oxygen at night, Parkinson's disease, history of DVT not on anticoagulation, and wheelchair-bound due to left ankle fracture who presents for worsening shaking and jerking over the last 4 days or so.  She reports that she has been having nausea with dry heaves only able to bring up spit for which she has been possibly taking Reglan scheduled and Zofran as needed.  Despite this she states her symptoms have been persisting.  Denies having any specific abdominal pain.  Appears her symptoms initially started back possibly even before 07/2022 when she had been hospitalized after having a syncopal episode.  At that time CT scan of the abdomen pelvis which noted concern for diverticulitis with abscess and she had been treated with IV antibiotics.  She was also noted to be significantly anemic and required blood transfusion.  Records note anticoagulation had been stopped for her to follow-up with her PCP in outpatient setting.  She had a follow-up CT 09/16/2031 which showed acute diverticulitis in the sigmoid colon with concern for a mild local contained perforation or pericolic abscess similar in location to prior abscess noted from 07/2022.  Patient has been treated with Augmentin 875 mg p.o. twice daily for 10 days due to concern of a smoldering diverticulitis.  She reported back to the ED on 09/29/2022 with nausea an diarrhea where CT scan at that time showed mild sigmoid diverticulitis without signs of perforation or abscess treated  with Zosyn IV x 1 dose.  She had been discharged back to the nursing facility with Reglan.  She reports intermittent periods where she has felt better and therefore symptoms had been felt secondary to infection.  She has had poor p.o. intake and with reported weight loss of approximately 50 pounds.  There was also concern for the possibility of underlying malignancy, and she is scheduled to have a colonoscopy on 11/03/22 with Dr. Fuller Plan of gastroenterology.  She chronically reports having mucus in her throat with nasal congestion for which she has to cough in order to try and clear her throat.  Normally she is only on 3 L of oxygen at night, but have been requiring it continuously for the last couple of days.  Other associated symptoms include loose stools, but does not know if there has been blood present.  She normally asked staff if it stools look normal, and reports they have not told her that they had looked abnormal.  She had been seen in the ED on 10/19/2022 where CT scan noted concerning for diverticulitis without perforation or abscess and moderate pleural effusions with passive atelectasis.  She had been seen by gastroenterology on 10/20/2022 with recommendations to treat symptomatically.  Patient incidentally notes that she had an episode of incontinence recently with dark urine.   In the emergency department patient was noted to be tachypneic with O2 saturations 94 to 100% on 4 L nasal cannula oxygen.  Labs significant for hemoglobin 7.3, platelets 118, potassium 3.8, BUN 34, creatinine 4.74, calcium 7.8, and albumin 2.7.  Chest x-ray noted small bilateral  pleural effusions increased on the right since 1/10.  CT scan of the brain did not note any acute abnormality and appears to be from priors.   Review of Systems: As mentioned in the history of present illness. All other systems reviewed and are negative. Past Medical History:  Diagnosis Date   Acute and chronic respiratory failure  (acute-on-chronic) (HCC)    Acute deep vein thrombosis (DVT) of popliteal vein of left lower extremity ([place]) 03/11/2017   Anxiety    Arthritis    osteo   Diabetes mellitus, type 2 (HCC)    Type II   DVT (deep venous thrombosis) ([place]) 03/2017   left popliteal   ESRD on hemodialysis (HCC)    Facet joint disease    Facet joint disease    foot   Fibula fracture 02/2017   left   GERD (gastroesophageal reflux disease)    H/O blood clots    Hyperlipidemia    Hypertension    Macrocytic anemia    Morbid obesity (HCC)    OSA (obstructive sleep apnea)    does not use CPAP    Osteopenia    Renal disorder    MWF dialysis   Shortness of breath    with exertion   Supplemental oxygen dependent    Venous stasis ulcers ([place])    Vitamin D deficiency    Past Surgical History:  Procedure Laterality Date   ANKLE FUSION Left 06/24/2020   Procedure: LEFT TIBIOCALCANEAL FUSION;  Surgeon: [name], MD;  Location: [facility];  Service: Orthopedics;  Laterality: Left;   AV FISTULA PLACEMENT Right 08/20/2018   Procedure: ARTERIOVENOUS (AV) FISTULA CREATION BRACHIOCEPHALIC;  Surgeon: Angelia Mould, MD;  Location: [facility];  Service: Vascular;  Laterality: Right;   BREAST SURGERY Right 03/1997   biospy   CARPAL TUNNEL RELEASE Left 2016   HYSTEROSCOPY WITH D & C N/A 09/29/2020   Procedure: DILATATION AND CURETTAGE /HYSTEROSCOPY;  Surgeon: Mora Bellman, MD;  Location: [facility];  Service: Gynecology;  Laterality: N/A;   INSERTION OF MESH N/A 01/08/2013   Procedure: INSERTION OF MESH;  Surgeon: Earnstine Regal, MD;  Location: [facility];  Service: General;  Laterality: N/A;   IR THORACENTESIS ASP PLEURAL SPACE W/IMG GUIDE  04/06/2017   JOINT REPLACEMENT Right 1999   TOTAL KNEE ARTHROPLASTY Left 1999   Left   TRIGGER FINGER RELEASE Left 11/2014   TRIGGER FINGER RELEASE Right    VENTRAL HERNIA REPAIR N/A 01/08/2013   Procedure: LAPAROSCOPIC VENTRAL HERNIA;  Surgeon: Earnstine Regal, MD;  Location: [facility];   Service: General;  Laterality: N/A;   Social History:  reports that she quit smoking about 41 years ago. Her smoking use included cigarettes. She has never used smokeless tobacco. She reports that she does not drink alcohol and does not use drugs.  Allergies  Allergen Reactions   Ms Contin [Morphine] Nausea Only    Family History  Problem Relation Age of Onset   Leukemia Mother    Hypertension Brother    Emphysema Father     Prior to Admission medications   Medication Sig Start Date End Date Taking? Authorizing Provider  acetaminophen (TYLENOL) 500 MG tablet Take 2 tablets (1,000 mg total) by mouth 3 (three) times daily. Patient taking differently: Take 1,000 mg by mouth 3 (three) times daily as needed (pain). 01/14/22   Dahal, Marlowe Aschoff, MD  albuterol (VENTOLIN HFA) 108 (90 Base) MCG/ACT inhaler Inhale 2 puffs into the lungs every 6 (six) hours as  needed for wheezing or shortness of breath.    [provider]  atorvastatin (LIPITOR) 20 MG tablet Take 20 mg by mouth in the morning.    [provider]  buprenorphine (BUTRANS) 10 MCG/HR [place] 1 patch onto the skin once a week.    [provider]  carbidopa-levodopa (SINEMET IR) 25-100 MG tablet Take 1-2 tablets by mouth See admin instructions. 2 entries on MAR: 2 tablets every morning (from 0600-1000) + 1 tablet twice daily (from 1200-1400 & 1800-2300)    [provider]  docusate sodium (COLACE) 100 MG capsule Take 200 mg by mouth at bedtime. Hold for loose stools    [provider]  doxercalciferol (HECTOROL) 4 MCG/2ML injection Inject 1 mL (2 mcg total) into the vein every Monday, Wednesday, and Friday with hemodialysis. 01/27/20   [place] Eric J, DO  fluticasone (FLONASE) 50 MCG/ACT nasal spray Place 1 spray into both nostrils 2 (two) times daily as needed for allergies.    [provider]  gabapentin (NEURONTIN) 300 MG capsule Take 300 mg by mouth at bedtime.    [provider]  Glycerin, Laxative, (GLYCERIN, ADULT,) 2 g SUPP Place 1 suppository rectally daily as needed for moderate constipation. 07/28/22   Geradine Girt, DO  hydrOXYzine (ATARAX) 50 MG tablet Take 50 mg by mouth 2 (two) times daily as needed (no indication given on MAR).    [provider]  insulin aspart (NOVOLOG FLEXPEN) 100 UNIT/ML FlexPen Inject 3-9 Units into the skin See admin instructions. Inject 3-9 units before meals and at bedtime per sliding scale: BS 70-100 : 3 units BS 101-150: 4 units BS 151-200: 5 units BS 201-250: 6 units BS 251-300: 7 units BS 301-350: 8 units BS 351-400: 9 units BS > 400 : call 911 HOLD on M-W-F when at dialysis    [provider]  insulin aspart (NOVOLOG) 100 UNIT/ML injection Inject 0-5 Units into the skin at bedtime. 01/14/22   Terrilee Croak, MD  loperamide (IMODIUM A-D) 2 MG tablet Take 2 mg by mouth 2 (two) times daily as needed for diarrhea or loose stools.    [provider]  melatonin 5 MG TABS Take 5 mg by mouth at bedtime.    [provider]  metoCLOPramide (REGLAN) 10 MG tablet Take 1 tablet (10 mg total) by mouth every 6 (six) hours. 09/29/22   Phyllis Ginger, MD  midodrine (PROAMATINE) 10 MG tablet Take 10-20 mg by mouth See admin instructions. 20 mg every Monday, Wednesday, Friday prior to Hemodialysis and 10 mg half-way through hemodialysis.    [provider]  mirtazapine (REMERON) 15 MG tablet Take 15 mg by mouth at bedtime.    [provider]  Na Sulfate-K Sulfate-Mg Sulf 17.5-3.13-1.6 GM/177ML SOLN Refer to Instruction sent to you for your colonoscopy 09/19/22   Ladene Artist, MD  nystatin [facility]) powder Apply 1 Application topically daily. To groin and under breasts Kept in room, pt self applies    [provider]  ondansetron (ZOFRAN-ODT) 8 MG disintegrating tablet Take 1 tablet (8 mg total) by mouth every 6 (six) hours. Scheduled while on abx and then PRN after abx  complete 07/28/22   Geradine Girt, DO  pantoprazole (PROTONIX) 40 MG tablet Take 1 tablet (40 mg total) by mouth 2 (two) times daily. BID for 14 days and then daily Patient taking differently: Take 40 mg by mouth in the morning. 07/28/22   Geradine Girt, DO  polyethylene  glycol powder (GLYCOLAX/MIRALAX) 17 GM/SCOOP powder Take 17 g by mouth daily as needed (constipation).    [provider]  pramoxine (SARNA SENSITIVE) 1 % LOTN Apply 1 Application topically daily. Kept in room, pt self applies    [provider]  promethazine (PHENERGAN) 12.5 MG suppository Place 1 suppository (12.5 mg total) rectally every 6 (six) hours as needed for nausea or vomiting. 07/28/22   Geradine Girt, DO  Ropinirole HCl 12 MG TB24 Take 12 mg by mouth in the morning.    [provider]  Simethicone (GAS-X EXTRA STRENGTH PO) Take 1 tablet by mouth every 12 (twelve) hours as needed (heartburn, indigestion).    [provider]  tamsulosin (FLOMAX) 0.4 MG CAPS capsule Take 1 capsule (0.4 mg total) by mouth daily. Patient taking differently: Take 0.4 mg by mouth at bedtime. 01/16/22   Terrilee Croak, MD    Physical Exam: Vitals:   10/24/22 0530 10/24/22 0600 10/24/22 0613 10/24/22 0645  BP: 122/60 (!) 117/59  106/80  Pulse: 70 68  72  Resp: '15 19  16  '$ Temp:   98.2 F (36.8 C)   TempSrc:   Oral   SpO2: 96% 96%  97%   Exam  Constitutional: Elderly female who appears to be in some discomfort Eyes: PERRL, lids and conjunctivae normal ENMT: Mucous membranes are moist. Posterior pharynx clear of any exudate or lesions.  Neck: normal, supple Respiratory: Normal respiratory effort with some crackles heard in the lower lung fields.  Patient on 4 L nasal cannula oxygen with O2 saturations currently maintained. Cardiovascular: Regular rate and rhythm, with positive 2/6 systolic murmur present.  Trace lower extremity edema with left leg 2 times the size of the right leg.  Right upper  extremity fistula with palpable thrill. Abdomen: no tenderness, no masses palpated.   Bowel sounds positive.  Musculoskeletal: no clubbing / cyanosis.  Deformity present of the left ankle. Skin: no rashes, lesions, ulcers.  Pallor present Neurologic: CN 2-12 grossly intact.  Intermittent jerking movements of the upper and lower extremities. Psychiatric: Normal judgment and insight. Alert and oriented x 3. Normal mood.   Data Reviewed:  EKG reveals sinus rhythm at 68 bpm with LVH.  Reviewed labs, imaging and pertinent records as noted above in HPI.  Assessment and Plan: Symptomatic anemia Acute.  On admission hemoglobin 7.3 g/dL, but had been 9 g/dL just 5 days ago.  Patient is not sure if she has had any blood present in her stools.  Stool was noted to showed no signs of blood present by ED provider.  She was typed and screened and ordered 1 unit of PRBCs. -Admit to a medical telemetry bed -Continue with transfusion of 1 unit of PRBCs -Recheck H&H post transfusion -GI consulted, we will follow-up for any further recommendations  Shaking and jerking episodes Patient reports having worsening shaking and jerking episodes.  CT scan of the head did not note any acute abnormality.  Unclear at this time the cause of symptoms but question the possibility of Reglan and being the cause.   -Neurochecks -Hold Reglan  Diverticulitis Acute on chronic.  Patient had initially been found to have concern for diverticulitis with concern for diverticular abscess back in 07/2022.  Despite treatment with IV antibiotics and p.o. antibiotics most recent CT scan of the abdomen pelvis from 10/20/2022 noted diverticulitis without concern for abscess or perforation.  Patient denies having any abdominal pain, but has persistent nausea with reports of loose stools.  Plan  previously been for colonoscopy in 1/24 with Dr. Fuller Plan. -Add-on ESR and CRP -Check GI panel and C. difficile studies given recent antibiotics  given -GI consulted, for possible need of colonoscopy in the inpatient setting  Acute on chronic respiratory failure with hypoxia OSA Patient reports normally only being on 3 L of oxygen at night, but for the last couple of days has been requiring oxygen 24/7 due to feeling short of breath.  No significant wheezing or rhonchi appreciated on physical exam.  Chest x-ray noted small bilateral pleural effusions with increased effusion noted on the right since prior check on 1/11. -Continuous pulse oximetry with nasal cannula oxygen maintain O2 saturations greater than 90%.  Nausea  Weight loss Acute on chronic.  Patient reports having persistent nausea for which she has had poor p.o. intake.  Patient reports weight loss of approximately 50 pounds over these last several months. -Clear liquid diet as tolerated -Continue Protonix 40 mg p.o. twice daily  ESRD on HD Patient normally on a Monday, Wednesday, Friday hemodialysis schedule.  Clinically she appears to have some excess fluid on her.  Labs noted potassium 3.8, BUN 34, and creatinine 4.7 -Dr. Hollie Salk of nephrology consulted for need of dialysis  Controlled diabetes mellitus type 2, with long-term use of insulin On admission glucose 82.  Last available hemoglobin A1c 6.9 on 07/2022 -Hypoglycemic protocols -CBGs before every meal and nightly with sensitive SSI -Adjust regimen as needed  Thrombocytopenia Acute.  Platelet count 118 on admission.  Review of records note platelet count had been transiently low at 139 previously back in October 2023. -Continue to monitor  History of DVT Patient with prior history of left popliteal DVT back in 2018.  Review of records note patient was taken off of anticoagulation during hospitalization 07/2022 due to severe anemia.  Patient had a Doppler ultrasound lower extremities that did not note any signs of a DVT.  Patient again with low hemoglobin for which anticoagulation is not recommended at this time.   On physical exam patient's left lower extremity twice the size of the right. -Recheck Doppler ultrasound of lower extremities  Parkinson's disease -Continue home medication regimen once able  Abnormal TSH Patient noted to have TSH low at 0.291 with free T4 0.95 on 07/23/2022 -Check TSH and free T4  Kidney lesion Noted to be increased in size on CT scan from 07/2022 for which MRI of the abdomen recommended in 6 months at that time.  DVT prophylaxis: TED hose, but might consider SCDs once Doppler ultrasound rules out possibility of DVT Advance Care Planning:   Code Status: DNR   Consults: Nephrology  Family Communication: Called patient's brother to update over the phone Severity of Illness: The appropriate patient status for this patient is OBSERVATION. Observation status is judged to be reasonable and necessary in order to provide the required intensity of service to ensure the patient's safety. The patient's presenting symptoms, physical exam findings, and initial radiographic and laboratory data in the context of their medical condition is felt to place them at decreased risk for further clinical deterioration. Furthermore, it is anticipated that the patient will be medically stable for discharge from the hospital within 2 midnights of admission.   Author: Norval Morton, MD 10/24/2022 7:27 AM  For on call review [email or web address].

## 2022-10-24 NOTE — ED Triage Notes (Signed)
Pt was sent over from SN[facility]. Staff reports pt has been jerking or shaking. Pt reports she has Parkinson and they jerking started Thursday.

## 2022-10-24 NOTE — Consult Note (Signed)
Reason for Consult: To manage dialysis and dialysis related needs Referring Physician: Dr Trinda Pascal is an 77 y.o. female.  HPI: pt is a 11F with a PMH sig for HNTN, HLD, ESRD on HD MWF at [facility], h/o DVT, who is now seen in consultation at the request of Dr Tamala Julian for provision of HD and management of ESRD.    PT is at a SNF- noted to be having myoclonic jerking movements for the past several days.  Takes gabapentin 300 QHS per home med list.  Also noted ot have a Hgb of 7.3.  Hasn't missed HD.  Has a 3 hr and 15 min run time.    In ED, CT head negative. No history of movement disorders or this happening before.  She remains AAO x3   Dialyzes at [facility] [place] 3 hr 15 min 3K  Past Medical History:  Diagnosis Date   Acute and chronic respiratory failure (acute-on-chronic) (HCC)    Acute deep vein thrombosis (DVT) of popliteal vein of left lower extremity ([place]) 03/11/2017   Anxiety    Arthritis    osteo   Diabetes mellitus, type 2 (HCC)    Type II   DVT (deep venous thrombosis) ([place]) 03/2017   left popliteal   ESRD on hemodialysis (HCC)    Facet joint disease    foot   Fibula fracture 02/2017   left   GERD (gastroesophageal reflux disease)    H/O blood clots    Hyperlipidemia    Hypertension    Macrocytic anemia    Morbid obesity (HCC)    OSA (obstructive sleep apnea)    does not use CPAP    Osteopenia    Renal disorder    MWF dialysis   Supplemental oxygen dependent    Venous stasis ulcers ([place])    Vitamin D deficiency     Past Surgical History:  Procedure Laterality Date   ANKLE FUSION Left 06/24/2020   Procedure: LEFT TIBIOCALCANEAL FUSION;  Surgeon: [name], MD;  Location: [facility];  Service: Orthopedics;  Laterality: Left;   AV FISTULA PLACEMENT Right 08/20/2018   Procedure: ARTERIOVENOUS (AV) FISTULA CREATION BRACHIOCEPHALIC;  Surgeon: Angelia Mould, MD;  Location: [facility];  Service: Vascular;  Laterality: Right;   BREAST SURGERY Right  03/1997   biospy   CARPAL TUNNEL RELEASE Left 2016   HYSTEROSCOPY WITH D & C N/A 09/29/2020   Procedure: DILATATION AND CURETTAGE /HYSTEROSCOPY;  Surgeon: Mora Bellman, MD;  Location: [facility];  Service: Gynecology;  Laterality: N/A;   INSERTION OF MESH N/A 01/08/2013   Procedure: INSERTION OF MESH;  Surgeon: Earnstine Regal, MD;  Location: [facility];  Service: General;  Laterality: N/A;   IR THORACENTESIS ASP PLEURAL SPACE W/IMG GUIDE  04/06/2017   JOINT REPLACEMENT Right 1999   TOTAL KNEE ARTHROPLASTY Left 1999   Left   TRIGGER FINGER RELEASE Left 11/2014   TRIGGER FINGER RELEASE Right    VENTRAL HERNIA REPAIR N/A 01/08/2013   Procedure: LAPAROSCOPIC VENTRAL HERNIA;  Surgeon: Earnstine Regal, MD;  Location: [facility];  Service: General;  Laterality: N/A;    Family History  Problem Relation Age of Onset   Leukemia Mother    Hypertension Brother    Emphysema Father     Social History:  reports that she quit smoking about 41 years ago. Her smoking use included cigarettes. She has never used smokeless tobacco. She reports that she does not drink alcohol and does  not use drugs.  Allergies:  Allergies  Allergen Reactions   Ms Contin [Morphine] Nausea Only    Medications: Scheduled:  sodium chloride   Intravenous Once   calcitRIOL  1.25 mcg Oral Q M,W,F-HD   Chlorhexidine Gluconate Cloth  6 each Topical Q0600   insulin aspart  0-5 Units Subcutaneous QHS   insulin aspart  0-6 Units Subcutaneous TID WC   pantoprazole  40 mg Oral BID   sodium chloride flush  3 mL Intravenous Q12H     Results for orders placed or performed during the hospital encounter of 10/24/22 (from the past 48 hour(s))  Comprehensive metabolic panel     Status: Abnormal   Collection Time: 10/24/22  4:04 AM  Result Value Ref Range   Sodium 135 135 - 145 mmol/L   Potassium 3.8 3.5 - 5.1 mmol/L   Chloride 96 (L) 98 - 111 mmol/L   CO2 27 22 - 32 mmol/L   Glucose, Bld 82 70 - 99 mg/dL    Comment: Glucose reference range  applies only to samples taken after fasting for at least 8 hours.   BUN 34 (H) 8 - 23 mg/dL   Creatinine, Ser 4.74 (H) 0.44 - 1.00 mg/dL   Calcium 7.8 (L) 8.9 - 10.3 mg/dL   Total Protein 5.1 (L) 6.5 - 8.1 g/dL   Albumin 2.7 (L) 3.5 - 5.0 g/dL   AST 7 (L) 15 - 41 U/L   ALT 5 0 - 44 U/L   Alkaline Phosphatase 96 38 - 126 U/L   Total Bilirubin 0.8 0.3 - 1.2 mg/dL   GFR, Estimated 9 (L) >60 mL/min    Comment: (NOTE) Calculated using the CKD-EPI Creatinine Equation (2021)    Anion gap 12 5 - 15    Comment: Performed at [facility], [place] [address], [place], [place] [address]  CBC with Differential     Status: Abnormal   Collection Time: 10/24/22  4:04 AM  Result Value Ref Range   WBC 6.7 4.0 - 10.5 K/uL   RBC 2.68 (L) 3.87 - 5.11 MIL/uL   Hemoglobin 7.3 (L) 12.0 - 15.0 g/dL   HCT 24.7 (L) 36.0 - 46.0 %   MCV 92.2 80.0 - 100.0 fL   MCH 27.2 26.0 - 34.0 pg   MCHC 29.6 (L) 30.0 - 36.0 g/dL   RDW 19.7 (H) 11.5 - 15.5 %   Platelets 118 (L) 150 - 400 K/uL    Comment: REPEATED TO VERIFY   nRBC 0.0 0.0 - 0.2 %   Neutrophils Relative % 79 %   Neutro Abs 5.3 1.7 - 7.7 K/uL   Lymphocytes Relative 8 %   Lymphs Abs 0.5 (L) 0.7 - 4.0 K/uL   Monocytes Relative 5 %   Monocytes Absolute 0.3 0.1 - 1.0 K/uL   Eosinophils Relative 8 %   Eosinophils Absolute 0.5 0.0 - 0.5 K/uL   Basophils Relative 0 %   Basophils Absolute 0.0 0.0 - 0.1 K/uL   Immature Granulocytes 0 %   Abs Immature Granulocytes 0.03 0.00 - 0.07 K/uL    Comment: Performed at [facility], [place] [address], [place], [place] [address]  Magnesium     Status: None   Collection Time: 10/24/22  4:04 AM  Result Value Ref Range   Magnesium 1.7 1.7 - 2.4 mg/dL    Comment: Performed at [facility] [address] [place], [place] [address]  Phosphorus     Status: None  Collection Time: 10/24/22  4:04 AM  Result Value Ref Range   Phosphorus 3.8 2.5 - 4.6 mg/dL    Comment: Performed at [facility], [place] [address], [place], [place] [address]  Type and screen [place]     Status: None (Preliminary result)   Collection Time: 10/24/22  7:36 AM  Result Value Ref Range   ABO/RH(D) O POS    Antibody Screen NEG    Sample Expiration      10/27/2022,2359 Performed at [facility], [place] [address] [place], [place] [address]    Unit Number [id number]    Blood Component Type RED CELLS,LR    Unit division 00    Status of Unit ALLOCATED    Transfusion Status OK TO TRANSFUSE    Crossmatch Result Compatible   Prepare RBC (crossmatch)     Status: None   Collection Time: 10/24/22  8:00 AM  Result Value Ref Range   Order Confirmation      ORDER PROCESSED BY BLOOD BANK Performed at [facility], [place] [address] [place], [place] [address]   TSH     Status: None   Collection Time: 10/24/22 10:42 AM  Result Value Ref Range   TSH 2.455 0.350 - 4.500 uIU/mL    Comment: Performed by a 3rd Generation assay with a functional sensitivity of <=0.01 uIU/mL. Performed at [facility], [place] [address] [place], [place] [address]   T4, free     Status: None   Collection Time: 10/24/22 10:42 AM  Result Value Ref Range   Free T4 0.95 0.61 - 1.12 ng/dL    Comment: (NOTE) Biotin ingestion may interfere with free T4 tests. If the results are inconsistent with the TSH level, previous test results, or the clinical presentation, then consider biotin interference. If needed, order repeat testing after stopping biotin. Performed at [facility], [place] [address], [place], [place] [address]   Renal function panel     Status: Abnormal   Collection Time: 10/24/22 10:42 AM  Result Value Ref Range   Sodium 133 (L) 135 - 145 mmol/L   Potassium 3.8 3.5 - 5.1 mmol/L   Chloride 96 (L) 98 - 111 mmol/L   CO2 26 22 - 32 mmol/L   Glucose, Bld 88 70 - 99 mg/dL    Comment: Glucose reference range applies only to samples taken after fasting for at least 8 hours.   BUN 35 (H) 8 - 23  mg/dL   Creatinine, Ser 4.87 (H) 0.44 - 1.00 mg/dL   Calcium 7.7 (L) 8.9 - 10.3 mg/dL   Phosphorus 3.7 2.5 - 4.6 mg/dL   Albumin 2.7 (L) 3.5 - 5.0 g/dL   GFR, Estimated 9 (L) >60 mL/min    Comment: (NOTE) Calculated using the CKD-EPI Creatinine Equation (2021)    Anion gap 11 5 - 15    Comment: Performed at [facility] [address] [place], [place] [address]  CBG monitoring, ED     Status: None   Collection Time: 10/24/22 11:40 AM  Result Value Ref Range   Glucose-Capillary 95 70 - 99 mg/dL    Comment: Glucose reference range applies only to samples taken after fasting for at least 8 hours.    VAS [place] LOWER EXTREMITY VENOUS (DVT)  Result Date: 10/24/2022  Lower Venous DVT Study Patient Name:  KALEESI GUYTON  Date of Exam:   10/24/2022 Medical Rec #: [id number]  Accession #:    [id number] Date of Birth: [birth year]          Patient Gender: F Patient Age:   42 years Exam Location:  [facility] Procedure:      VAS [place] LOWER EXTREMITY VENOUS (DVT) Referring Phys: Fuller Plan --------------------------------------------------------------------------------  Indications: Swelling.  Comparison Study: Prior negative bilateral LEV done 07/22/22 Performing Technologist: Sharion Dove RVS  Examination Guidelines: A complete evaluation includes B-mode imaging, spectral Doppler, color Doppler, and power Doppler as needed of all accessible portions of each vessel. Bilateral testing is considered an integral part of a complete examination. Limited examinations for reoccurring indications may be performed as noted. The reflux portion of the exam is performed with the patient in reverse Trendelenburg.  +---------+---------------+---------+-----------+----------+-------------------+ RIGHT    CompressibilityPhasicitySpontaneityPropertiesThrombus Aging      +---------+---------------+---------+-----------+----------+-------------------+ CFV      Full           Yes      Yes                                       +---------+---------------+---------+-----------+----------+-------------------+ SFJ      Full                                                             +---------+---------------+---------+-----------+----------+-------------------+ FV Prox  Full                                                             +---------+---------------+---------+-----------+----------+-------------------+ FV Mid   Full                                                             +---------+---------------+---------+-----------+----------+-------------------+ FV DistalFull                                                             +---------+---------------+---------+-----------+----------+-------------------+ PFV      Full                                                             +---------+---------------+---------+-----------+----------+-------------------+ POP      Full           Yes      Yes                                      +---------+---------------+---------+-----------+----------+-------------------+ PTV  Not well visualized +---------+---------------+---------+-----------+----------+-------------------+ PERO                                                  Not well visualized +---------+---------------+---------+-----------+----------+-------------------+   +---------+---------------+---------+-----------+----------+--------------+ LEFT     CompressibilityPhasicitySpontaneityPropertiesThrombus Aging +---------+---------------+---------+-----------+----------+--------------+ CFV      Full           Yes      Yes                                 +---------+---------------+---------+-----------+----------+--------------+ SFJ      Full                                                        +---------+---------------+---------+-----------+----------+--------------+ FV Prox   Full                                                        +---------+---------------+---------+-----------+----------+--------------+ FV Mid   Full                                                        +---------+---------------+---------+-----------+----------+--------------+ FV DistalFull                                                        +---------+---------------+---------+-----------+----------+--------------+ PFV      Full                                                        +---------+---------------+---------+-----------+----------+--------------+ POP      Full           Yes      Yes                                 +---------+---------------+---------+-----------+----------+--------------+ PTV      Full                                                        +---------+---------------+---------+-----------+----------+--------------+ PERO     Full                                                        +---------+---------------+---------+-----------+----------+--------------+  Summary: BILATERAL: - No evidence of deep vein thrombosis seen in the lower extremities, bilaterally. - RIGHT: - No cystic structure found in the popliteal fossa.  LEFT: - No cystic structure found in the popliteal fossa.  *See table(s) above for measurements and observations.    Preliminary    CT Head Wo Contrast  Result Date: 10/24/2022 CLINICAL DATA:  77 year old female with jerking, shaking. EXAM: CT HEAD WITHOUT CONTRAST TECHNIQUE: Contiguous axial images were obtained from the base of the skull through the vertex without intravenous contrast. RADIATION DOSE REDUCTION: This exam was performed according to the departmental dose-optimization program which includes automated exposure control, adjustment of the mA and/or kV according to patient size and/or use of iterative reconstruction technique. COMPARISON:  Brain MRI 07/21/2022.  Head CT 07/03/2020. FINDINGS: Brain:  Cerebral volume is not significantly changed since 2021, within normal limits for age. 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 No midline shift, ventriculomegaly, mass effect, evidence of mass lesion, intracranial hemorrhage or evidence of cortically based acute infarction. Gray-white matter differentiation is stable and within normal limits for age. Vascular: Calcified atherosclerosis at the skull base. No suspicious intracranial vascular hyperdensity. Skull: No acute osseous abnormality identified. Sinuses/Orbits: Visualized paranasal sinuses and mastoids are stable and well aerated. Other: No acute orbit or scalp soft tissue finding. IMPRESSION: Stable and normal for age non contrast CT appearance of the brain. Electronically Signed   By: Genevie Ann M.D.   On: 10/24/2022 05:18   DG Chest Port 1 View  Result Date: 10/24/2022 CLINICAL DATA:  77 year old female missed dialysis. EXAM: PORTABLE CHEST 1 VIEW COMPARISON:  Portable chest 10/19/2022 and earlier. FINDINGS: Portable AP semi upright view at 0400 hours. Stable lung volumes. Stable cardiac size and mediastinal contours. On going left lung base hypo ventilation which appears in part related to small pleural effusion. Increased right lung and lung base veiling opacity and blunting of the right costophrenic angle. No superimposed pneumothorax, pulmonary edema, or consolidation. Paucity of bowel gas in the visible abdomen. Stable visualized osseous structures. Both glenohumeral joints are chronically dislocated since at least 07/21/2022. IMPRESSION: 1. Small bilateral pleural effusions, increased on the right since 10/19/2022. 2. No other acute cardiopulmonary abnormality. 3. Chronic bilateral glenohumeral joint dislocations. Electronically Signed   By: Genevie Ann M.D.   On: 10/24/2022 04:31    ROS: all other systems reviewed and negative except as per HPI Blood pressure 138/73, pulse 75, temperature 98.5 F  (36.9 C), temperature source Oral, resp. rate 18, SpO2 96 %. GEN  nad, sitting in bed HEENT EOMI PERRL, + facial twitching NECK no JVD PULM clear CV RRR ABD soft EXT no LE edema NEURO AAO x 3, myoclonic jerking SKIN warm and dry MSK no effusions  Assessment/Plan: 1 Myoclonus/ jerking: possibly gabapentin toxicity- CT head negative.  [place] OK.  Stop gabapentin.  Consider MRI- per primary.  HD today 2 ESRD: MWF [facility], HD on schedule today 3 Hypertension: BP meds at home 4. Anemia of ESRD:  getting 1 u pRBCs today, will give ESA as appropriate 5. 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  Dispo: admitted  Madelon Lips 10/24/2022, 1:47 PM

## 2022-10-24 NOTE — H&P (View-Only) (Signed)
[facility] Consult: 11:30 AM 10/24/2022  LOS: 0 days    Referring Provider: Dr Harvest Forest  Primary Care Physician: APP at her nursing facility. Primary Gastroenterologist:  Dr. Fuller Plan.     Reason for Consultation: Nausea, vomiting and ongoing diverticulitis.   HPI: Jean Mcclain is a 77 y.o. female.  Multiple medical problems listed below.  On Eliquis for hx DVT 2018.  IDDM, A1c 6.9 in 07/2022.  Obesity.  Parkinson's.  ESRD on HD MWF. No previous EGD or colonoscopy.    Initial inpt GI evaluation w Dr. Fuller Plan in October 2023 for episodic nausea, vomiting, food aversion, weight loss from 235 to 150 pounds over a year.  07/21/2022 CTAP w contrast confirmed sigmoid diverticulitis with 2.5 cm intramural abscess.  Treated with Rocephin/Flagyl and then completed course of Augmentin.  Endorsed copious clear nasal secretions a lot of which she would swallow.  Dr. Fuller Plan canceled planned EGD after symptoms resolved with scheduled Zofran, Protonix 40 mg daily. Pt received 1 prbc for Hgb 6.8, MCV 85 (10.5 and 96 six m prior).   At 08/30/2022 GI NP office follow-up the upper GI symptoms continued resolved.  Occasional mild mid abdominal discomfort, nothing significant. However upper GI symptoms recurred in early January, despite scheduled Reglan 10 mg po every 6 hours and Zofran prn, Protonix.  She also has a buprenorphine patch weekly for pain management.  Colonoscopy set for 11/03/2022 at [facility] though not clear she can tolerate prep and may just pursue EGD (not officially  scheduled).  Nausea w some non-bloody emesis has continued leading to repeat CT imaging x 3.  Treated w another 10d Augmentin after 12/7 CT, this led to diarrhea.  No additional abx since then. Persistent postnasal drip, nasal congestion.  She says Flonase worsened  the nasal drip.     09/15/2022 CTAP w contrast: Still showing sigmoid diverticulitis with substantial posterior wall thickening, gas tracking along posterior wall of colon, possibly representing microperforation or small pericolic abscess.  However appearance was similar to the October CT.  Given the appearance of the changes in the colon were persistent, possibility of malignancy was not excluded.  Lesions in the ascending colon suspicious for fatty polyps 1 of which measured 4.1 cm in length.  Non GI findings included small pleural effusions, atelectasis, mitral and aortic valve calcifications, cardiomegaly.  Cyst versus mass in the right kidney, renal atrophy and bilateral cysts.  Enlarging left adnexal cyst.  Arthropathy in the hips.  Spondylosis, degenerative disc disease multilevel.  Benign appearing exophytic, right adnexal cyst.  Aortic atherosclerosis. 09/29/22 CTAP w contrast: Mild sigmoid diverticulitis, stable.  No drainable fluid collection/abscess.  No free air.  Bilateral pleural effusions and lower lobe atelectasis. 10/20/22 CTAP w contrast: Mild acute diverticulitis at junction descending/sigmoid.  No abscess, no perforation.  Appearance stable compared with recent CT imaging.  Persistent pleural effusions and atelectasis.  Indeterminate right kidney lesion, MRI recommended for further eval.  Benign left adrenal adenoma.  Simple appearing cyst at left adnexa.  Aortic atherosclerosis  Transported from SNF to ED with  reports of shaking, jerking which had been progressive since Thursday vs Friday, 4 to 5 days ago. Ongoing GI and ENT issues, loose/non-bloody/non melenic stool on 1/15.   Last dialysis session was on Wednesday, she missed Friday session because she was in the ED.  10/24/22 CT head:   Stable, normal for age cerebral volume and gray-white matter differentiation.  Small congenital intracranial lipoma.  Calcified atherosclerosis at base of skull. LFTS normal.  Lipase 30 on 10/19/22.   Albumin 2.7.  GFR 9.   Hgb 9... 7.3, MCV 92.  Platelets 118.    Plan for HD today along w 1 PRBC.    Lives at [facility], [place].  Quit smoking in the 1980s.  Does not consume alcoholic beverages.  Her brother Annett Gula is her [facility] POA.  Currently he is recovering from some sort of surgery at home. Family history negative for colorectal cancer, ulcer disease, swallowing issues, esophageal cancer.  No history pancreatic or liver problems.    Past Medical History:  Diagnosis Date   Acute and chronic respiratory failure (acute-on-chronic) (HCC)    Acute deep vein thrombosis (DVT) of popliteal vein of left lower extremity ([place]) 03/11/2017   Anxiety    Arthritis    osteo   Diabetes mellitus, type 2 (HCC)    Type II   DVT (deep venous thrombosis) ([place]) 03/2017   left popliteal   ESRD on hemodialysis (HCC)    Facet joint disease    foot   Fibula fracture 02/2017   left   GERD (gastroesophageal reflux disease)    H/O blood clots    Hyperlipidemia    Hypertension    Macrocytic anemia    Morbid obesity (HCC)    OSA (obstructive sleep apnea)    does not use CPAP    Osteopenia    Renal disorder    MWF dialysis   Supplemental oxygen dependent    Venous stasis ulcers ([place])    Vitamin D deficiency     Past Surgical History:  Procedure Laterality Date   ANKLE FUSION Left 06/24/2020   Procedure: LEFT TIBIOCALCANEAL FUSION;  Surgeon: [name], MD;  Location: [facility];  Service: Orthopedics;  Laterality: Left;   AV FISTULA PLACEMENT Right 08/20/2018   Procedure: ARTERIOVENOUS (AV) FISTULA CREATION BRACHIOCEPHALIC;  Surgeon: Angelia Mould, MD;  Location: [facility];  Service: Vascular;  Laterality: Right;   BREAST SURGERY Right 03/1997   biospy   CARPAL TUNNEL RELEASE Left 2016   HYSTEROSCOPY WITH D & C N/A 09/29/2020   Procedure: DILATATION AND CURETTAGE /HYSTEROSCOPY;  Surgeon: Mora Bellman, MD;  Location: [facility];  Service: Gynecology;  Laterality: N/A;   INSERTION OF MESH  N/A 01/08/2013   Procedure: INSERTION OF MESH;  Surgeon: Earnstine Regal, MD;  Location: [facility];  Service: General;  Laterality: N/A;   IR THORACENTESIS ASP PLEURAL SPACE W/IMG GUIDE  04/06/2017   JOINT REPLACEMENT Right 1999   TOTAL KNEE ARTHROPLASTY Left 1999   Left   TRIGGER FINGER RELEASE Left 11/2014   TRIGGER FINGER RELEASE Right    VENTRAL HERNIA REPAIR N/A 01/08/2013   Procedure: LAPAROSCOPIC VENTRAL HERNIA;  Surgeon: Earnstine Regal, MD;  Location: [facility];  Service: General;  Laterality: N/A;    Prior to Admission medications   Medication Sig Start Date End Date Taking? Authorizing Provider  acetaminophen (TYLENOL) 500 MG tablet Take 2 tablets (1,000 mg total) by mouth 3 (three) times daily. Patient taking differently: Take 1,000 mg by mouth  3 (three) times daily as needed (pain). 01/14/22  Yes Dahal, Marlowe Aschoff, MD  albuterol (VENTOLIN HFA) 108 (90 Base) MCG/ACT inhaler Inhale 2 puffs into the lungs every 6 (six) hours as needed for wheezing or shortness of breath.   Yes [provider]  atorvastatin (LIPITOR) 20 MG tablet Take 20 mg by mouth daily.   Yes [provider]  buprenorphine (BUTRANS) 10 MCG/HR [place] 1 patch onto the skin once a week.   Yes [provider]  carbidopa-levodopa (SINEMET IR) 25-100 MG tablet Take 1-2 tablets by mouth See admin instructions. 2 entries on MAR: Take 2 tablets by mouth every morning. Take 1 tablet by mouth twice daily.   Yes [provider]  docusate sodium (COLACE) 100 MG capsule Take 200 mg by mouth at bedtime. Hold for loose stools   Yes [provider]  fluticasone (FLONASE) 50 MCG/ACT nasal spray Place 1 spray into both nostrils 2 (two) times daily as needed for allergies.   Yes [provider]  gabapentin (NEURONTIN) 300 MG capsule Take 300 mg by mouth at bedtime.   Yes [provider]  Glycerin, Laxative, (GLYCERIN, ADULT,) 2 g SUPP Place 1 suppository rectally daily as needed for  moderate constipation. 07/28/22  Yes Vann, Jessica U, DO  hydrOXYzine (ATARAX) 50 MG tablet Take 50 mg by mouth 2 (two) times daily as needed (no indication given on MAR).   Yes [provider]  insulin aspart (NOVOLOG FLEXPEN) 100 UNIT/ML FlexPen Inject 3-9 Units into the skin See admin instructions. Inject 3-9 units before meals and at bedtime per sliding scale: BS 70-100 : 3 units BS 101-150: 4 units BS 151-200: 5 units BS 201-250: 6 units BS 251-300: 7 units BS 301-350: 8 units BS 351-400: 9 units BS > 400 : call 911 HOLD on M-W-F when at dialysis   Yes [provider]  loperamide (IMODIUM A-D) 2 MG tablet Take 2 mg by mouth 2 (two) times daily as needed for diarrhea or loose stools.   Yes [provider]  melatonin 5 MG TABS Take 5 mg by mouth at bedtime.   Yes [provider]  metoCLOPramide (REGLAN) 10 MG tablet Take 1 tablet (10 mg total) by mouth every 6 (six) hours. 09/29/22  Yes Phyllis Ginger, MD  midodrine (PROAMATINE) 10 MG tablet Take 10-20 mg by mouth See admin instructions. 20 mg every Monday, Wednesday, Friday prior to Hemodialysis and 10 mg half-way through hemodialysis.   Yes [provider]  mirtazapine (REMERON) 15 MG tablet Take 15 mg by mouth at bedtime.   Yes [provider]  nystatin [facility]) powder Apply 1 Application topically daily. To groin and under breasts Kept in room, pt self applies   Yes [provider]  ondansetron (ZOFRAN-ODT) 8 MG disintegrating tablet Take 1 tablet (8 mg total) by mouth every 6 (six) hours. Scheduled while on abx and then PRN after abx complete Patient taking differently: Take 8 mg by mouth every 6 (six) hours as needed for nausea or vomiting. 07/28/22  Yes Vann, Jessica U, DO  OXYGEN Inhale 3 L into the lungs at bedtime as needed (SOB).   Yes [provider]  pantoprazole (PROTONIX) 40 MG tablet Take 1 tablet (40 mg total) by mouth 2 (two) times daily. BID for 14 days  and then daily Patient taking differently: Take 40 mg by mouth daily. 07/28/22  Yes Vann, Jessica U, DO  polyethylene glycol powder (GLYCOLAX/MIRALAX) 17 GM/SCOOP powder Take 17 g  by mouth daily as needed (constipation).   Yes [provider]  pramoxine (SARNA SENSITIVE) 1 % LOTN Apply 1 Application topically daily. Kept in room, pt self applies   Yes [provider]  promethazine (PHENERGAN) 12.5 MG suppository Place 1 suppository (12.5 mg total) rectally every 6 (six) hours as needed for nausea or vomiting. 07/28/22  Yes Eulogio Bear U, DO  Ropinirole HCl 12 MG TB24 Take 12 mg by mouth daily.   Yes [provider]  Simethicone (GAS-X EXTRA STRENGTH PO) Take 1 tablet by mouth every 12 (twelve) hours as needed (heartburn, indigestion).   Yes [provider]  tamsulosin (FLOMAX) 0.4 MG CAPS capsule Take 1 capsule (0.4 mg total) by mouth daily. Patient taking differently: Take 0.4 mg by mouth at bedtime. 01/16/22  Yes Dahal, Marlowe Aschoff, MD  doxercalciferol (HECTOROL) 4 MCG/2ML injection Inject 1 mL (2 mcg total) into the vein every Monday, Wednesday, and Friday with hemodialysis. Patient not taking: Reported on 10/24/2022 01/27/20   [place] Donnamarie Poag, DO  insulin aspart (NOVOLOG) 100 UNIT/ML injection Inject 0-5 Units into the skin at bedtime. Patient not taking: Reported on 10/24/2022 01/14/22   Terrilee Croak, MD  Na Sulfate-K Sulfate-Mg Sulf 17.5-3.13-1.6 GM/177ML SOLN Refer to Instruction sent to you for your colonoscopy Patient not taking: Reported on 10/24/2022 09/19/22   Ladene Artist, MD    Scheduled Meds:  sodium chloride   Intravenous Once   calcitRIOL  1.25 mcg Oral Q M,W,F-HD   Chlorhexidine Gluconate Cloth  6 each Topical Q0600   insulin aspart  0-5 Units Subcutaneous QHS   insulin aspart  0-6 Units Subcutaneous TID WC   pantoprazole  40 mg Oral BID   sodium chloride flush  3 mL Intravenous Q12H   Infusions:  anticoagulant sodium citrate     PRN  Meds: acetaminophen **OR** acetaminophen, albuterol, alteplase, anticoagulant sodium citrate, dextrose, heparin, heparin, lidocaine (PF), lidocaine-prilocaine, ondansetron **OR** ondansetron (ZOFRAN) IV, pentafluoroprop-tetrafluoroeth   Allergies as of 10/24/2022 - Review Complete 10/24/2022  Allergen Reaction Noted   Ms contin [morphine] Nausea Only 05/24/2022    Family History  Problem Relation Age of Onset   Leukemia Mother    Hypertension Brother    Emphysema Father     Social History   Socioeconomic History   Marital status: Single    Spouse name: Not on file   Number of children: Not on file   Years of education: Not on file   Highest education level: Not on file  Occupational History   Not on file  Tobacco Use   Smoking status: Former    Years: 27.00    Types: Cigarettes    Quit date: 10/10/1981    Years since quitting: 41.0   Smokeless tobacco: Never  Vaping Use   Vaping Use: Never used  Substance and Sexual Activity   Alcohol use: No   Drug use: No   Sexual activity: Not on file  Other Topics Concern   Not on file  Social History Narrative   ** Merged History Encounter **       Social Determinants of Health   Financial Resource Strain: Not on file  Food Insecurity: Food Insecurity Present (07/22/2022)   Hunger Vital Sign    Worried About Estate manager/land agent of Food in the Last Year: Often true    Ran Out of Food in the Last Year: Often true  Transportation Needs: No Transportation Needs (07/22/2022)   PRAPARE - Hydrologist (  Medical): No    Lack of Transportation (Non-Medical): No  Physical Activity: Not on file  Stress: Not on file  Social Connections: Not on file  Intimate Partner Violence: Not At Risk (07/22/2022)   Humiliation, Afraid, Rape, and Kick questionnaire    Fear of Current or Ex-Partner: No    Emotionally Abused: No    Physically Abused: No    Sexually Abused: No    REVIEW OF SYSTEMS: Constitutional: Some  weakness, mild fatigue ENT:  No nose bleeds Pulm: For a few weeks has been needing to use oxygen continually, previously it was as needed. CV:  No palpitations, no LE edema.  No angina GU:  No hematuria, no frequency.  No oliguria. GI: See HPI. Heme: Denies unusual or excessive bleeding or bruising. Transfusions: As per HPI. Neuro: Limb and trunk jerking in recent days.  No headaches, no peripheral tingling or numbness.  No seizures, no syncope. Derm:  No itching, no rash or sores.  Endocrine:  No sweats or chills.  No polyuria or dysuria Immunization: Reviewed Travel:  None     PHYSICAL EXAM: Vital signs in last 24 hours: Vitals:   10/24/22 1030 10/24/22 1100  BP: (!) 123/55 (!) 126/59  Pulse: 67 69  Resp:  18  Temp:    SpO2: 96% 100%   Wt Readings from Last 3 Encounters:  10/19/22 70.8 kg  09/29/22 70.8 kg  08/30/22 70.8 kg    General: Patient is pleasant, gregarious, looks chronically ill and pale. Head: No signs of head trauma.  No facial asymmetry or swelling. Eyes: No scleral icterus or conjunctival pallor. Ears: Not hard of hearing Nose: No sniffling or signs of congestion. Mouth: Mucosa is pink, clear, somewhat dry.  Tongue is midline. Neck: No JVD, no masses, no thyromegaly Lungs: Clear bilaterally with mildly reduced breath sounds throughout.  No adventitious sounds.  No cough or labored breathing. Heart: RRR.  No MRG.  S1, S2 present. Abdomen: Soft, not tender or distended.  No HSM, masses, bruits, hernias.  Active bowel sounds.   Rectal: Deferred Musc/Skeltl: No joint redness, swelling or gross deformity. Extremities: No CCE.  Dialysis access graft RUE. Neurologic: Oriented x 3.  Moves all 4 limbs, strength not tested.  No tremors, jerking during the 20-minute encounter. Skin: No rash, no sores, no telangiectasia.  No significant purpura or bruising. Nodes: No cervical adenopathy Psych: Gregarious, pleasant, slightly anxious.  Fluid speech.  Intake/Output  from previous day: No intake/output data recorded. Intake/Output this shift: No intake/output data recorded.  LAB RESULTS: Recent Labs    10/24/22 0404  WBC 6.7  HGB 7.3*  HCT 24.7*  PLT 118*   BMET Lab Results  Component Value Date   NA 135 10/24/2022   NA 137 10/19/2022   NA 137 09/29/2022   K 3.8 10/24/2022   K 4.1 10/19/2022   K 3.4 (L) 09/29/2022   CL 96 (L) 10/24/2022   CL 97 (L) 10/19/2022   CL 95 (L) 09/29/2022   CO2 27 10/24/2022   CO2 30 10/19/2022   CO2 32 09/29/2022   GLUCOSE 82 10/24/2022   GLUCOSE 96 10/19/2022   GLUCOSE 91 09/29/2022   BUN 34 (H) 10/24/2022   BUN 10 10/19/2022   BUN 5 (L) 09/29/2022   CREATININE 4.74 (H) 10/24/2022   CREATININE 2.03 (H) 10/19/2022   CREATININE 3.18 (H) 09/29/2022   CALCIUM 7.8 (L) 10/24/2022   CALCIUM 8.5 (L) 10/19/2022   CALCIUM 8.5 (L) 09/29/2022   LFT Recent  Labs    10/24/22 0404  PROT 5.1*  ALBUMIN 2.7*  AST 7*  ALT 5  ALKPHOS 96  BILITOT 0.8   PT/INR Lab Results  Component Value Date   INR 1.3 (H) 07/23/2022   INR 1.5 (H) 01/09/2022   INR 1.65 03/30/2017   Hepatitis Panel No results for input(s): "HEPBSAG", "HCVAB", "HEPAIGM", "HEPBIGM" in the last 72 hours. C-Diff No components found for: "CDIFF" Lipase     Component Value Date/Time   LIPASE 30 10/19/2022 2127    Drugs of Abuse  No results found for: "LABOPIA", "COCAINSCRNUR", "LABBENZ", "AMPHETMU", "THCU", "LABBARB"   RADIOLOGY STUDIES: CT Head Wo Contrast  Result Date: 10/24/2022 CLINICAL DATA:  77 year old female with jerking, shaking. EXAM: CT HEAD WITHOUT CONTRAST TECHNIQUE: Contiguous axial images were obtained from the base of the skull through the vertex without intravenous contrast. RADIATION DOSE REDUCTION: This exam was performed according to the departmental dose-optimization program which includes automated exposure control, adjustment of the mA and/or kV according to patient size and/or use of iterative reconstruction  technique. COMPARISON:  Brain MRI 07/21/2022.  Head CT 07/03/2020. FINDINGS: Brain: Cerebral volume is not significantly changed since 2021, within normal limits for age. 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 No midline shift, ventriculomegaly, mass effect, evidence of mass lesion, intracranial hemorrhage or evidence of cortically based acute infarction. Gray-white matter differentiation is stable and within normal limits for age. Vascular: Calcified atherosclerosis at the skull base. No suspicious intracranial vascular hyperdensity. Skull: No acute osseous abnormality identified. Sinuses/Orbits: Visualized paranasal sinuses and mastoids are stable and well aerated. Other: No acute orbit or scalp soft tissue finding. IMPRESSION: Stable and normal for age non contrast CT appearance of the brain. Electronically Signed   By: Genevie Ann M.D.   On: 10/24/2022 05:18   DG Chest Port 1 View  Result Date: 10/24/2022 CLINICAL DATA:  77 year old female missed dialysis. EXAM: PORTABLE CHEST 1 VIEW COMPARISON:  Portable chest 10/19/2022 and earlier. FINDINGS: Portable AP semi upright view at 0400 hours. Stable lung volumes. Stable cardiac size and mediastinal contours. On going left lung base hypo ventilation which appears in part related to small pleural effusion. Increased right lung and lung base veiling opacity and blunting of the right costophrenic angle. No superimposed pneumothorax, pulmonary edema, or consolidation. Paucity of bowel gas in the visible abdomen. Stable visualized osseous structures. Both glenohumeral joints are chronically dislocated since at least 07/21/2022. IMPRESSION: 1. Small bilateral pleural effusions, increased on the right since 10/19/2022. 2. No other acute cardiopulmonary abnormality. 3. Chronic bilateral glenohumeral joint dislocations. Electronically Signed   By: Genevie Ann M.D.   On: 10/24/2022 04:31     IMPRESSION:   Chronic nausea.   Food aversion, anorexia.  Weight loss.  Symptoms persist despite metoclopramide, PPI, antiemetics.  Could have diabetic gastroparesis or slow motility due to chronic topical opioid (Butrans patch).  Ulcer disease, gastritis less likely given chronic PPI.  ?  If related to excessive postnasal drip, would ENT evaluation be helpful?  Persistent sigmoid diverticulitis dating back to October 2023, 4 months ago.  At presentation in October and since then, she is not experiencing abdominal pain.  No previous colonoscopy.  Colonoscopy set up for later this month on January 25 though not clear she is going to be able to prep for this procedure.  Parkinson's disease.  Several days of increased jerking, is this side effect of Reglan versus due to missing dialysis session on Friday?  Head CT nonacute.  ESRD.  Missed 1 HD session on 1/12.  HD planned for this afternoon.  Chronic anemia.  Hb now 7.3.  No overt bleeding.  PRBC x 1 planned during HD.    IDDM with good recent hemoglobin A1c's.  Patient reports recent blood sugars in the 60s and 70s at SNF    Episode of diarrhea, not chronic or prolonged. Hx antiobiotic associated diarrhea that resolved after finishing abx.  Stool C diff, FOBT and PCR path panel ordered by attending MD   PLAN:     EGD tomorrow, patient agreeable.  Needs colonoscopy but she will be able to tolerate prep at present so defer this to the future.  Changed clear liquid diet to full liquids.  She was strongly advocating for pot roast which she recalls fondly from her admission in October.  However I said we needed to wait before advancing her to solids.     Azucena Freed  10/24/2022, 11:30 AM Phone [phone]

## 2022-10-24 NOTE — Progress Notes (Signed)
   10/24/22 2215  Vitals  Temp 98.7 F (37.1 C)  BP (!) 147/56  Pulse Rate (!) 47  ECG Heart Rate (!) 56  Resp 18  Oxygen Therapy  SpO2 100 %  O2 Device Nasal Cannula  O2 Flow Rate (L/min) 3 L/min  Post Treatment  Dialyzer Clearance Heavily streaked  Duration of HD Treatment -hour(s) 3.5 hour(s)  Liters Processed 83.9  Fluid Removed (mL) 2000 mL  Tolerated HD Treatment Yes  AVG/AVF Arterial Site Held (minutes) 10 minutes  AVG/AVF Venous Site Held (minutes) 10 minutes   RBC given, TX fin. W/o difficulty.

## 2022-10-24 NOTE — ED Provider Notes (Signed)
Jean Mcclain   CSN: [id number] Arrival date & time: 10/24/22  0222     History  Chief Complaint  Patient presents with   Shaking    Jean Mcclain is a 77 y.o. female.  The history is provided by the patient and medical records.  Jean Mcclain is a 77 y.o. female who presents to the Emergency Department complaining of shaking.  She presents to the emergency department for evaluation of shaking and jerking episodes that have been ongoing since her recent discharge from the emergency department.  She states that she has restless leg and oftentimes gets movement in her legs.  She feels like she is having increased jerking and shaking mostly in her right leg but this does involve her entire body and including her mouth when she eats ice.  She has been seen multiple times for recurrent postnasal drip, congestion and nausea/vomiting.  No current vomiting.  She has been recently seen by both the emergency department and GI.  She is taking metoclopramide and ondansetron for her symptoms.  She reports feeling cold for months.  She did have some diarrhea today.  No chest pain, difficulty breathing, abdominal pain.  She has chronic sacral pain from a fall 2 years ago as well as chronic ankle pain from her fracture that she sustained 2 years ago.  She does have ESRD and dialyzes Monday, Wednesday, Friday.  Last dialysis session was on Wednesday.  She states that she missed her Friday session due to receiving evaluation in the emergency department.  She is on as needed oxygen.  She is a resident at [facility].  She also has type 2 diabetes, anxiety, Parkinson's, hypertension.     Home Medications Prior to Admission medications   Medication Sig Start Date End Date Taking? Authorizing Provider  acetaminophen (TYLENOL) 500 MG tablet Take 2 tablets (1,000 mg total) by mouth 3 (three) times daily. Patient taking differently: Take 1,000 mg  by mouth 3 (three) times daily as needed (pain). 01/14/22   Dahal, Marlowe Aschoff, MD  albuterol (VENTOLIN HFA) 108 (90 Base) MCG/ACT inhaler Inhale 2 puffs into the lungs every 6 (six) hours as needed for wheezing or shortness of breath.    [provider]  atorvastatin (LIPITOR) 20 MG tablet Take 20 mg by mouth in the morning.    [provider]  buprenorphine (BUTRANS) 10 MCG/HR [place] 1 patch onto the skin once a week.    [provider]  carbidopa-levodopa (SINEMET IR) 25-100 MG tablet Take 1-2 tablets by mouth See admin instructions. 2 entries on MAR: 2 tablets every morning (from 0600-1000) + 1 tablet twice daily (from 1200-1400 & 1800-2300)    [provider]  docusate sodium (COLACE) 100 MG capsule Take 200 mg by mouth at bedtime. Hold for loose stools    [provider]  doxercalciferol (HECTOROL) 4 MCG/2ML injection Inject 1 mL (2 mcg total) into the vein every Monday, Wednesday, and Friday with hemodialysis. 01/27/20   [place] Eric J, DO  fluticasone (FLONASE) 50 MCG/ACT nasal spray Place 1 spray into both nostrils 2 (two) times daily as needed for allergies.    [provider]  gabapentin (NEURONTIN) 300 MG capsule Take 300 mg by mouth at bedtime.    [provider]  Glycerin, Laxative, (GLYCERIN, ADULT,) 2 g SUPP Place 1 suppository rectally daily as needed for moderate constipation. 07/28/22   Geradine Girt, DO  hydrOXYzine (ATARAX) 50 MG  tablet Take 50 mg by mouth 2 (two) times daily as needed (no indication given on MAR).    [provider]  insulin aspart (NOVOLOG FLEXPEN) 100 UNIT/ML FlexPen Inject 3-9 Units into the skin See admin instructions. Inject 3-9 units before meals and at bedtime per sliding scale: BS 70-100 : 3 units BS 101-150: 4 units BS 151-200: 5 units BS 201-250: 6 units BS 251-300: 7 units BS 301-350: 8 units BS 351-400: 9 units BS > 400 : call 911 HOLD on M-W-F when at dialysis    [provider]  insulin aspart (NOVOLOG) 100 UNIT/ML injection Inject 0-5 Units into the skin at bedtime. 01/14/22   Terrilee Croak, MD  loperamide (IMODIUM A-D) 2 MG tablet Take 2 mg by mouth 2 (two) times daily as needed for diarrhea or loose stools.    [provider]  melatonin 5 MG TABS Take 5 mg by mouth at bedtime.    [provider]  metoCLOPramide (REGLAN) 10 MG tablet Take 1 tablet (10 mg total) by mouth every 6 (six) hours. 09/29/22   Phyllis Ginger, MD  midodrine (PROAMATINE) 10 MG tablet Take 10-20 mg by mouth See admin instructions. 20 mg every Monday, Wednesday, Friday prior to Hemodialysis and 10 mg half-way through hemodialysis.    [provider]  mirtazapine (REMERON) 15 MG tablet Take 15 mg by mouth at bedtime.    [provider]  Na Sulfate-K Sulfate-Mg Sulf 17.5-3.13-1.6 GM/177ML SOLN Refer to Instruction sent to you for your colonoscopy 09/19/22   Ladene Artist, MD  nystatin [facility]) powder Apply 1 Application topically daily. To groin and under breasts Kept in room, pt self applies    [provider]  ondansetron (ZOFRAN-ODT) 8 MG disintegrating tablet Take 1 tablet (8 mg total) by mouth every 6 (six) hours. Scheduled while on abx and then PRN after abx complete 07/28/22   Geradine Girt, DO  pantoprazole (PROTONIX) 40 MG tablet Take 1 tablet (40 mg total) by mouth 2 (two) times daily. BID for 14 days and then daily Patient taking differently: Take 40 mg by mouth in the morning. 07/28/22   Geradine Girt, DO  polyethylene glycol powder (GLYCOLAX/MIRALAX) 17 GM/SCOOP powder Take 17 g by mouth daily as needed (constipation).    [provider]  pramoxine (SARNA SENSITIVE) 1 % LOTN Apply 1 Application topically daily. Kept in room, pt self applies    [provider]  promethazine (PHENERGAN) 12.5 MG suppository Place 1 suppository (12.5 mg total) rectally every 6 (six) hours as needed for nausea or vomiting. 07/28/22    Geradine Girt, DO  Ropinirole HCl 12 MG TB24 Take 12 mg by mouth in the morning.    [provider]  Simethicone (GAS-X EXTRA STRENGTH PO) Take 1 tablet by mouth every 12 (twelve) hours as needed (heartburn, indigestion).    [provider]  tamsulosin (FLOMAX) 0.4 MG CAPS capsule Take 1 capsule (0.4 mg total) by mouth daily. Patient taking differently: Take 0.4 mg by mouth at bedtime. 01/16/22   Terrilee Croak, MD      Allergies    Ms contin [morphine]    Review of Systems   Review of Systems  All other systems reviewed and are negative.   Physical Exam Updated Vital Signs BP 106/80   Pulse 72   Temp 98.2 F (36.8 C) (Oral)   Resp 16   SpO2 97%  Physical Exam Vitals and nursing Mcclain reviewed.  Constitutional:  Appearance: She is well-developed.  HENT:     Head: Normocephalic and atraumatic.  Cardiovascular:     Rate and Rhythm: Normal rate and regular rhythm.     Heart sounds: No murmur heard. Pulmonary:     Effort: Pulmonary effort is normal. No respiratory distress.     Breath sounds: Normal breath sounds.  Abdominal:     Palpations: Abdomen is soft.     Tenderness: There is no abdominal tenderness. There is no guarding or rebound.  Genitourinary:    Comments: Pale brown stool in the rectal vault, no gross blood Musculoskeletal:        General: No tenderness.     Comments: Fistula in the right upper extremity.  Skin:    General: Skin is warm and dry.  Neurological:     Mental Status: She is alert and oriented to person, place, and time.     Comments: Mild generalized weakness.  Unable to elicit patellar reflexes bilaterally but she does have a history of bilateral knee surgeries.  No ankle clonus.  Sensation to light touch intact in all 4 extremities.  No asymmetry of facial movements.  Visual fields grossly intact.  Psychiatric:        Behavior: Behavior normal.     ED Results / Procedures / Treatments   Labs (all labs ordered are  listed, but only abnormal results are displayed) Labs Reviewed  COMPREHENSIVE METABOLIC PANEL - Abnormal; Notable for the following components:      Result Value   Chloride 96 (*)    BUN 34 (*)    Creatinine, Ser 4.74 (*)    Calcium 7.8 (*)    Total Protein 5.1 (*)    Albumin 2.7 (*)    AST 7 (*)    GFR, Estimated 9 (*)    All other components within normal limits  CBC WITH DIFFERENTIAL/PLATELET - Abnormal; Notable for the following components:   RBC 2.68 (*)    Hemoglobin 7.3 (*)    HCT 24.7 (*)    MCHC 29.6 (*)    RDW 19.7 (*)    Platelets 118 (*)    Lymphs Abs 0.5 (*)    All other components within normal limits  MAGNESIUM  PHOSPHORUS  TYPE AND SCREEN  PREPARE RBC (CROSSMATCH)    EKG EKG Interpretation  Date/Time:  Monday October 24 2022 04:20:59 EST Ventricular Rate:  68 PR Interval:  174 QRS Duration: 94 QT Interval:  429 QTC Calculation: 457 R Axis:   -36 Text Interpretation: Sinus or ectopic atrial rhythm Left ventricular hypertrophy Confirmed by Quintella Reichert [phone]) on 10/24/2022 4:44:03 AM  Radiology CT Head Wo Contrast  Result Date: 10/24/2022 CLINICAL DATA:  77 year old female with jerking, shaking. EXAM: CT HEAD WITHOUT CONTRAST TECHNIQUE: Contiguous axial images were obtained from the base of the skull through the vertex without intravenous contrast. RADIATION DOSE REDUCTION: This exam was performed according to the departmental dose-optimization program which includes automated exposure control, adjustment of the mA and/or kV according to patient size and/or use of iterative reconstruction technique. COMPARISON:  Brain MRI 07/21/2022.  Head CT 07/03/2020. FINDINGS: Brain: Cerebral volume is not significantly changed since 2021, within normal limits for age. 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 No midline shift, ventriculomegaly, mass effect, evidence of mass lesion, intracranial hemorrhage or evidence  of cortically based acute infarction. Gray-white matter differentiation is stable and within normal limits for age. Vascular: Calcified atherosclerosis at the skull base. No suspicious  intracranial vascular hyperdensity. Skull: No acute osseous abnormality identified. Sinuses/Orbits: Visualized paranasal sinuses and mastoids are stable and well aerated. Other: No acute orbit or scalp soft tissue finding. IMPRESSION: Stable and normal for age non contrast CT appearance of the brain. Electronically Signed   By: Genevie Ann M.D.   On: 10/24/2022 05:18   DG Chest Port 1 View  Result Date: 10/24/2022 CLINICAL DATA:  77 year old female missed dialysis. EXAM: PORTABLE CHEST 1 VIEW COMPARISON:  Portable chest 10/19/2022 and earlier. FINDINGS: Portable AP semi upright view at 0400 hours. Stable lung volumes. Stable cardiac size and mediastinal contours. On going left lung base hypo ventilation which appears in part related to small pleural effusion. Increased right lung and lung base veiling opacity and blunting of the right costophrenic angle. No superimposed pneumothorax, pulmonary edema, or consolidation. Paucity of bowel gas in the visible abdomen. Stable visualized osseous structures. Both glenohumeral joints are chronically dislocated since at least 07/21/2022. IMPRESSION: 1. Small bilateral pleural effusions, increased on the right since 10/19/2022. 2. No other acute cardiopulmonary abnormality. 3. Chronic bilateral glenohumeral joint dislocations. Electronically Signed   By: Genevie Ann M.D.   On: 10/24/2022 04:31    Procedures Procedures    Medications Ordered in ED Medications  0.9 %  sodium chloride infusion (Manually program via Guardrails IV Fluids) (has no administration in time range)  sodium chloride flush (NS) 0.9 % injection 3 mL (has no administration in time range)  acetaminophen (TYLENOL) tablet 650 mg (has no administration in time range)    Or  acetaminophen (TYLENOL) suppository 650 mg (has no  administration in time range)  albuterol (PROVENTIL) (2.5 MG/3ML) 0.083% nebulizer solution 2.5 mg (has no administration in time range)  ondansetron (ZOFRAN) tablet 4 mg (has no administration in time range)    Or  ondansetron (ZOFRAN) injection 4 mg (has no administration in time range)    ED Course/ Medical Decision Making/ A&P                             Medical Decision Making Amount and/or Complexity of Data Reviewed Labs: ordered. Radiology: ordered.  Risk Prescription drug management. Decision regarding hospitalization.   Patient with history of ESRD on hemodialysis, Parkinson's, recurrent nausea and vomiting here for evaluation of increased shaking and jerking episodes.  No demonstrable episodes during her ED stay.  She is globally weak.  CT head without acute abnormality.  Chest x-ray does demonstrate progressive pleural effusions-images personally reviewed and interpreted, agree with radiologist interpretation.  Patient's renal function is consistent with her ESRD.  She has missed dialysis but she does not have any life-threatening electrolyte derangements.  In terms of her shaking and jerking, suspect this may be related to her medications and underlying Parkinson's.  She is on metoclopramide, 10 mg 3-4 times daily as well as ondansetron, carbidopa levodopa.  She is also found to be significantly anemic today.  Her hemoglobins have been downtrending on her recent ED visits.  She does not look at her stool, unclear if she is having hematochezia or melena at home but there is no clear blood on her rectal examination today.  Suspect the anemia is multifactorial.  Discussed risk and benefit of blood transfusion, suspect this may be contributing to her symptoms.  Patient is agreeable with blood transfusion.  Given that she missed dialysis on Friday she will need to receive dialysis after blood transfusion.  Medicine consulted for observation admission for  symptomatic anemia and ESRD with  missed dialysis.        Final Clinical Impression(s) / ED Diagnoses Final diagnoses:  Symptomatic anemia  ESRD (end stage renal disease) on dialysis ([place])  Abnormal movements    Rx / DC Orders ED Discharge Orders     None         Quintella Reichert, MD 10/24/22 [phone]

## 2022-10-24 NOTE — Progress Notes (Signed)
Recent CT AP reports reviewed. Her most recent CT AP likely shows changes from resolving diverticulitis in the sigmoid colon and less likely acute diverticulitis. Proceed with colonoscopy as planned.

## 2022-10-25 ENCOUNTER — Inpatient Hospital Stay (HOSPITAL_COMMUNITY): Payer: PPO | Admitting: Certified Registered"

## 2022-10-25 ENCOUNTER — Inpatient Hospital Stay (HOSPITAL_COMMUNITY): Payer: PPO

## 2022-10-25 ENCOUNTER — Encounter (HOSPITAL_COMMUNITY)
Admission: EM | Disposition: A | Discharge status: Hospice | Payer: Self-pay | Source: Skilled Nursing Facility | Attending: Internal Medicine

## 2022-10-25 ENCOUNTER — Encounter (HOSPITAL_COMMUNITY): Payer: Self-pay | Admitting: Internal Medicine

## 2022-10-25 DIAGNOSIS — D631 Anemia in chronic kidney disease: Secondary | ICD-10-CM | POA: Diagnosis present

## 2022-10-25 DIAGNOSIS — I120 Hypertensive chronic kidney disease with stage 5 chronic kidney disease or end stage renal disease: Secondary | ICD-10-CM | POA: Diagnosis present

## 2022-10-25 DIAGNOSIS — J9621 Acute and chronic respiratory failure with hypoxia: Secondary | ICD-10-CM | POA: Diagnosis present

## 2022-10-25 DIAGNOSIS — Z992 Dependence on renal dialysis: Secondary | ICD-10-CM | POA: Diagnosis not present

## 2022-10-25 DIAGNOSIS — G9341 Metabolic encephalopathy: Secondary | ICD-10-CM | POA: Diagnosis present

## 2022-10-25 DIAGNOSIS — Z66 Do not resuscitate: Secondary | ICD-10-CM | POA: Diagnosis present

## 2022-10-25 DIAGNOSIS — E43 Unspecified severe protein-calorie malnutrition: Secondary | ICD-10-CM | POA: Diagnosis present

## 2022-10-25 DIAGNOSIS — N186 End stage renal disease: Secondary | ICD-10-CM | POA: Diagnosis present

## 2022-10-25 DIAGNOSIS — J90 Pleural effusion, not elsewhere classified: Secondary | ICD-10-CM | POA: Diagnosis present

## 2022-10-25 DIAGNOSIS — I708 Atherosclerosis of other arteries: Secondary | ICD-10-CM | POA: Diagnosis present

## 2022-10-25 DIAGNOSIS — Z6830 Body mass index (BMI) 30.0-30.9, adult: Secondary | ICD-10-CM | POA: Diagnosis not present

## 2022-10-25 DIAGNOSIS — G40109 Localization-related (focal) (partial) symptomatic epilepsy and epileptic syndromes with simple partial seizures, not intractable, without status epilepticus: Secondary | ICD-10-CM | POA: Diagnosis present

## 2022-10-25 DIAGNOSIS — G253 Myoclonus: Secondary | ICD-10-CM

## 2022-10-25 DIAGNOSIS — Z8673 Personal history of transient ischemic attack (TIA), and cerebral infarction without residual deficits: Secondary | ICD-10-CM | POA: Diagnosis not present

## 2022-10-25 DIAGNOSIS — E1122 Type 2 diabetes mellitus with diabetic chronic kidney disease: Secondary | ICD-10-CM | POA: Diagnosis present

## 2022-10-25 DIAGNOSIS — E871 Hypo-osmolality and hyponatremia: Secondary | ICD-10-CM | POA: Diagnosis present

## 2022-10-25 DIAGNOSIS — J9811 Atelectasis: Secondary | ICD-10-CM | POA: Diagnosis present

## 2022-10-25 DIAGNOSIS — R112 Nausea with vomiting, unspecified: Secondary | ICD-10-CM | POA: Diagnosis not present

## 2022-10-25 DIAGNOSIS — I69398 Other sequelae of cerebral infarction: Secondary | ICD-10-CM | POA: Diagnosis not present

## 2022-10-25 DIAGNOSIS — R933 Abnormal findings on diagnostic imaging of other parts of digestive tract: Secondary | ICD-10-CM

## 2022-10-25 DIAGNOSIS — X58XXXA Exposure to other specified factors, initial encounter: Secondary | ICD-10-CM | POA: Diagnosis present

## 2022-10-25 DIAGNOSIS — R569 Unspecified convulsions: Secondary | ICD-10-CM | POA: Diagnosis not present

## 2022-10-25 DIAGNOSIS — L89151 Pressure ulcer of sacral region, stage 1: Secondary | ICD-10-CM | POA: Diagnosis present

## 2022-10-25 DIAGNOSIS — R110 Nausea: Secondary | ICD-10-CM

## 2022-10-25 DIAGNOSIS — N189 Chronic kidney disease, unspecified: Secondary | ICD-10-CM | POA: Diagnosis not present

## 2022-10-25 DIAGNOSIS — G2581 Restless legs syndrome: Secondary | ICD-10-CM | POA: Diagnosis present

## 2022-10-25 DIAGNOSIS — Z7189 Other specified counseling: Secondary | ICD-10-CM | POA: Diagnosis not present

## 2022-10-25 DIAGNOSIS — R259 Unspecified abnormal involuntary movements: Secondary | ICD-10-CM

## 2022-10-25 DIAGNOSIS — Z515 Encounter for palliative care: Secondary | ICD-10-CM | POA: Diagnosis not present

## 2022-10-25 DIAGNOSIS — Z794 Long term (current) use of insulin: Secondary | ICD-10-CM | POA: Diagnosis not present

## 2022-10-25 DIAGNOSIS — R4182 Altered mental status, unspecified: Secondary | ICD-10-CM | POA: Diagnosis not present

## 2022-10-25 DIAGNOSIS — R55 Syncope and collapse: Secondary | ICD-10-CM | POA: Diagnosis not present

## 2022-10-25 DIAGNOSIS — D62 Acute posthemorrhagic anemia: Secondary | ICD-10-CM | POA: Diagnosis present

## 2022-10-25 DIAGNOSIS — K5733 Diverticulitis of large intestine without perforation or abscess with bleeding: Secondary | ICD-10-CM | POA: Diagnosis present

## 2022-10-25 DIAGNOSIS — D649 Anemia, unspecified: Secondary | ICD-10-CM | POA: Diagnosis present

## 2022-10-25 DIAGNOSIS — E11649 Type 2 diabetes mellitus with hypoglycemia without coma: Secondary | ICD-10-CM | POA: Diagnosis not present

## 2022-10-25 DIAGNOSIS — D696 Thrombocytopenia, unspecified: Secondary | ICD-10-CM | POA: Diagnosis not present

## 2022-10-25 DIAGNOSIS — G40909 Epilepsy, unspecified, not intractable, without status epilepticus: Secondary | ICD-10-CM | POA: Diagnosis not present

## 2022-10-25 LAB — TYPE AND SCREEN
ABO/RH(D): O POS
Antibody Screen: NEGATIVE
Unit division: 0

## 2022-10-25 LAB — GLUCOSE, CAPILLARY
Glucose-Capillary: 95 mg/dL (ref 70–99)
Glucose-Capillary: 88 mg/dL (ref 70–99)

## 2022-10-25 LAB — BPAM RBC
Blood Product Expiration Date: 202401192359
ISSUE DATE / TIME: 202401151844
Unit Type and Rh: 5100

## 2022-10-25 LAB — RENAL FUNCTION PANEL
Albumin: 2.8 g/dL — ABNORMAL LOW (ref 3.5–5.0)
Anion gap: 12 (ref 5–15)
BUN: 14 mg/dL (ref 8–23)
CO2: 25 mmol/L (ref 22–32)
Calcium: 7.6 mg/dL — ABNORMAL LOW (ref 8.9–10.3)
Chloride: 97 mmol/L — ABNORMAL LOW (ref 98–111)
Creatinine, Ser: 2.49 mg/dL — ABNORMAL HIGH (ref 0.44–1.00)
GFR, Estimated: 20 mL/min — ABNORMAL LOW (ref >60)
Glucose, Bld: 95 mg/dL (ref 70–99)
Phosphorus: 2.3 mg/dL — ABNORMAL LOW (ref 2.5–4.6)
Potassium: 3.6 mmol/L (ref 3.5–5.1)
Sodium: 134 mmol/L — ABNORMAL LOW (ref 135–145)

## 2022-10-25 LAB — CBC
HCT: 29.3 % — ABNORMAL LOW (ref 36.0–46.0)
Hemoglobin: 9.1 g/dL — ABNORMAL LOW (ref 12.0–15.0)
MCH: 28.3 pg (ref 26.0–34.0)
MCHC: 31.1 g/dL (ref 30.0–36.0)
MCV: 91 fL (ref 80.0–100.0)
Platelets: 98 10*3/uL — ABNORMAL LOW (ref 150–400)
RBC: 3.22 MIL/uL — ABNORMAL LOW (ref 3.87–5.11)
RDW: 18.3 % — ABNORMAL HIGH (ref 11.5–15.5)
WBC: 6.2 10*3/uL (ref 4.0–10.5)
nRBC: 0 % (ref 0.0–0.2)

## 2022-10-25 LAB — DIC (DISSEMINATED INTRAVASCULAR COAGULATION)PANEL
D-Dimer, Quant: 2.17 ug/mL-FEU — ABNORMAL HIGH (ref 0.00–0.50)
Fibrinogen: 362 mg/dL (ref 210–475)
INR: 1.1 (ref 0.8–1.2)
Platelets: 93 10*3/uL — ABNORMAL LOW (ref 150–400)
Prothrombin Time: 14.2 seconds (ref 11.4–15.2)
Smear Review: NONE SEEN
aPTT: 29 seconds (ref 24–36)

## 2022-10-25 LAB — CBG MONITORING, ED
Glucose-Capillary: 107 mg/dL — ABNORMAL HIGH (ref 70–99)
Glucose-Capillary: 109 mg/dL — ABNORMAL HIGH (ref 70–99)
Glucose-Capillary: 104 mg/dL — ABNORMAL HIGH (ref 70–99)

## 2022-10-25 LAB — LACTATE DEHYDROGENASE: LDH: 177 U/L (ref 98–192)

## 2022-10-25 LAB — C-REACTIVE PROTEIN: CRP: 5.2 mg/dL — ABNORMAL HIGH (ref <1.0)

## 2022-10-25 SURGERY — CANCELLED PROCEDURE

## 2022-10-25 MED ORDER — LACTATED RINGERS IV SOLN: INTRAVENOUS | Status: DC

## 2022-10-25 MED ORDER — LORAZEPAM 2 MG/ML IJ SOLN: 2.0000 mg | Freq: Four times a day (QID) | INTRAMUSCULAR | Status: AC | PRN

## 2022-10-25 MED ORDER — ORAL CARE MOUTH RINSE: 15.0000 mL | OROMUCOSAL | Status: DC | PRN

## 2022-10-25 MED ORDER — HEPARIN SODIUM (PORCINE) 1000 UNIT/ML DIALYSIS: 1000.0000 [IU] | INTRAMUSCULAR | Status: DC | PRN

## 2022-10-25 MED ORDER — LEVETIRACETAM IN NACL 1000 MG/100ML IV SOLN
1000.0000 mg | Freq: Once | INTRAVENOUS | Status: AC
  Administered 2022-10-25: 1000 mg via INTRAVENOUS
  Filled 2022-10-25 (×2): qty 100

## 2022-10-25 MED ORDER — ONDANSETRON HCL 4 MG/2ML IJ SOLN
INTRAMUSCULAR | Status: AC
  Filled 2022-10-25: qty 2

## 2022-10-25 MED ORDER — LEVETIRACETAM IN NACL 500 MG/100ML IV SOLN: 500.0000 mg | Freq: Once | INTRAVENOUS | Status: DC

## 2022-10-25 MED ORDER — LEVETIRACETAM IN NACL 500 MG/100ML IV SOLN
500.0000 mg | Freq: Two times a day (BID) | INTRAVENOUS | Status: DC
  Administered 2022-10-25 – 2022-10-26 (×3): 500 mg via INTRAVENOUS
  Filled 2022-10-25 (×3): qty 100

## 2022-10-25 MED ORDER — HYDRALAZINE HCL 20 MG/ML IJ SOLN: 10.0000 mg | Freq: Four times a day (QID) | INTRAMUSCULAR | Status: DC | PRN

## 2022-10-25 MED ORDER — MIDAZOLAM HCL (PF) 5 MG/ML IJ SOLN
INTRAMUSCULAR | Status: AC
  Filled 2022-10-25: qty 1

## 2022-10-25 MED ORDER — MIDAZOLAM HCL (PF) 5 MG/ML IJ SOLN
INTRAMUSCULAR | Status: AC | PRN
  Administered 2022-10-25: 2.5 mg via INTRAVENOUS

## 2022-10-25 SURGICAL SUPPLY — 15 items

## 2022-10-25 NOTE — ED Notes (Signed)
Pt here from endo, reports of seizure witnessed by staff and given versed

## 2022-10-25 NOTE — Progress Notes (Signed)
EEG complete - results pending 

## 2022-10-25 NOTE — Telephone Encounter (Signed)
Pt currently in ED.

## 2022-10-25 NOTE — ED Notes (Signed)
ED TO INPATIENT HANDOFF REPORT  ED Nurse Name and Phone #:  Vanetta Shawl [phone]  S Name/Age/Gender Rexene Edison 77 y.o. female Room/Bed: 040C/040C  Code Status   Code Status: DNR  Home/SNF/Other Skilled nursing facility Patient oriented to: self and place Is this baseline? No   Triage Complete: Triage complete  Chief Complaint Symptomatic anemia [D64.9]  Triage Note Pt was sent over from [facility]. Staff reports pt has been jerking or shaking. Pt reports she has Parkinson and they jerking started Thursday.     Allergies Allergies  Allergen Reactions   Ms Contin [Morphine] Nausea Only    Level of Care/Admitting Diagnosis ED Disposition     ED Disposition  Admit   Condition  --   [place]: [facility] [[id number]]  Level of Care: Telemetry Medical [104]  May admit patient to [facility] or [facility] if equivalent level of care is available:: No  Covid Evaluation: Asymptomatic - no recent exposure (last 10 days) testing not required  Diagnosis: Symptomatic anemia [9702637]  Admitting Physician: Octavio Graves  Attending Physician: Thurnell Lose [[id number]]  Certification:: I certify this patient will need inpatient services for at least 2 midnights  Estimated Length of Stay: 2          B Medical/Surgery History Past Medical History:  Diagnosis Date   Acute and chronic respiratory failure (acute-on-chronic) ([place])    Acute deep vein thrombosis (DVT) of popliteal vein of left lower extremity ([place]) 03/11/2017   Anxiety    Arthritis    osteo   Diabetes mellitus, type 2 ([place])    Type II   DVT (deep venous thrombosis) ([place]) 03/2017   left popliteal   ESRD on hemodialysis ([place])    Facet joint disease    foot   Fibula fracture 02/2017   left   GERD (gastroesophageal reflux disease)    H/O blood clots    Hyperlipidemia    Hypertension    Macrocytic anemia    Morbid obesity (H[facility])    OSA  (obstructive sleep apnea)    does not use CPAP    Osteopenia    Renal disorder    MWF dialysis   Supplemental oxygen dependent    Venous stasis ulcers ([place])    Vitamin D deficiency    Past Surgical History:  Procedure Laterality Date   ANKLE FUSION Left 06/24/2020   Procedure: LEFT TIBIOCALCANEAL FUSION;  Surgeon: [name], MD;  Location: [facility];  Service: Orthopedics;  Laterality: Left;   AV FISTULA PLACEMENT Right 08/20/2018   Procedure: ARTERIOVENOUS (AV) FISTULA CREATION BRACHIOCEPHALIC;  Surgeon: Angelia Mould, MD;  Location: [facility];  Service: Vascular;  Laterality: Right;   BREAST SURGERY Right 03/1997   biospy   CARPAL TUNNEL RELEASE Left 2016   HYSTEROSCOPY WITH D & C N/A 09/29/2020   Procedure: DILATATION AND CURETTAGE /HYSTEROSCOPY;  Surgeon: Mora Bellman, MD;  Location: [facility];  Service: Gynecology;  Laterality: N/A;   INSERTION OF MESH N/A 01/08/2013   Procedure: INSERTION OF MESH;  Surgeon: Earnstine Regal, MD;  Location: [facility];  Service: General;  Laterality: N/A;   IR THORACENTESIS ASP PLEURAL SPACE W/IMG GUIDE  04/06/2017   JOINT REPLACEMENT Right 1999   TOTAL KNEE ARTHROPLASTY Left 1999   Left   TRIGGER FINGER RELEASE Left 11/2014   TRIGGER FINGER RELEASE Right    VENTRAL HERNIA REPAIR N/A 01/08/2013   Procedure: LAPAROSCOPIC VENTRAL HERNIA;  Surgeon:  Earnstine Regal, MD;  Location: [facility];  Service: General;  Laterality: N/A;     A IV Location/Drains/Wounds Patient Lines/Drains/Airways Status     Active Line/Drains/Airways     Name Placement date Placement time Site Days   Peripheral IV 10/24/22 22 G Anterior;Left Forearm 10/24/22  0741  Forearm  1   Fistula / Graft Right Upper arm --  --  Upper arm  --            Intake/Output Last 24 hours  Intake/Output Summary (Last 24 hours) at 10/25/2022 7829 Last data filed at 10/24/2022 2215 Gross per 24 hour  Intake 360 ml  Output 2000 ml  Net -1640 ml    Labs/Imaging Results for orders placed  or performed during the hospital encounter of 10/24/22 (from the past 48 hour(s))  Comprehensive metabolic panel     Status: Abnormal   Collection Time: 10/24/22  4:04 AM  Result Value Ref Range   Sodium 135 135 - 145 mmol/L   Potassium 3.8 3.5 - 5.1 mmol/L   Chloride 96 (L) 98 - 111 mmol/L   CO2 27 22 - 32 mmol/L   Glucose, Bld 82 70 - 99 mg/dL    Comment: Glucose reference range applies only to samples taken after fasting for at least 8 hours.   BUN 34 (H) 8 - 23 mg/dL   Creatinine, Ser 4.74 (H) 0.44 - 1.00 mg/dL   Calcium 7.8 (L) 8.9 - 10.3 mg/dL   Total Protein 5.1 (L) 6.5 - 8.1 g/dL   Albumin 2.7 (L) 3.5 - 5.0 g/dL   AST 7 (L) 15 - 41 U/L   ALT 5 0 - 44 U/L   Alkaline Phosphatase 96 38 - 126 U/L   Total Bilirubin 0.8 0.3 - 1.2 mg/dL   GFR, Estimated 9 (L) >60 mL/min    Comment: (NOTE) Calculated using the CKD-EPI Creatinine Equation (2021)    Anion gap 12 5 - 15    Comment: Performed at [facility], [place] [address], [place], [place] [address]  CBC with Differential     Status: Abnormal   Collection Time: 10/24/22  4:04 AM  Result Value Ref Range   WBC 6.7 4.0 - 10.5 K/uL   RBC 2.68 (L) 3.87 - 5.11 MIL/uL   Hemoglobin 7.3 (L) 12.0 - 15.0 g/dL   HCT 24.7 (L) 36.0 - 46.0 %   MCV 92.2 80.0 - 100.0 fL   MCH 27.2 26.0 - 34.0 pg   MCHC 29.6 (L) 30.0 - 36.0 g/dL   RDW 19.7 (H) 11.5 - 15.5 %   Platelets 118 (L) 150 - 400 K/uL    Comment: REPEATED TO VERIFY   nRBC 0.0 0.0 - 0.2 %   Neutrophils Relative % 79 %   Neutro Abs 5.3 1.7 - 7.7 K/uL   Lymphocytes Relative 8 %   Lymphs Abs 0.5 (L) 0.7 - 4.0 K/uL   Monocytes Relative 5 %   Monocytes Absolute 0.3 0.1 - 1.0 K/uL   Eosinophils Relative 8 %   Eosinophils Absolute 0.5 0.0 - 0.5 K/uL   Basophils Relative 0 %   Basophils Absolute 0.0 0.0 - 0.1 K/uL   Immature Granulocytes 0 %   Abs Immature Granulocytes 0.03 0.00 - 0.07 K/uL    Comment: Performed at [facility], [place] [address], [place], [place] [address]   Magnesium     Status: None   Collection Time: 10/24/22  4:04 AM  Result Value Ref  Range   Magnesium 1.7 1.7 - 2.4 mg/dL    Comment: Performed at [facility] [address] [place], [place] [address]  Phosphorus     Status: None   Collection Time: 10/24/22  4:04 AM  Result Value Ref Range   Phosphorus 3.8 2.5 - 4.6 mg/dL    Comment: Performed at [place] [address], [place], [place] [address]  Type and screen [place]     Status: None (Preliminary result)   Collection Time: 10/24/22  7:36 AM  Result Value Ref Range   ABO/RH(D) O POS    Antibody Screen NEG    Sample Expiration 10/27/2022,2359    Unit Number [id number]    Blood Component Type RED CELLS,LR    Unit division 00    Status of Unit ISSUED    Transfusion Status OK TO TRANSFUSE    Crossmatch Result      Compatible Performed at [facility], [place] [address], [place], [place] [address]   Prepare RBC (crossmatch)     Status: None   Collection Time: 10/24/22  8:00 AM  Result Value Ref Range   Order Confirmation      ORDER PROCESSED BY BLOOD BANK Performed at [facility], [place] [address] [place], [place] [address]   TSH     Status: None   Collection Time: 10/24/22 10:42 AM  Result Value Ref Range   TSH 2.455 0.350 - 4.500 uIU/mL    Comment: Performed by a 3rd Generation assay with a functional sensitivity of <=0.01 uIU/mL. Performed at [facility], [place] [address] [place], [place] [address]   T4, free     Status: None   Collection Time: 10/24/22 10:42 AM  Result Value Ref Range   Free T4 0.95 0.61 - 1.12 ng/dL    Comment: (NOTE) Biotin ingestion may interfere with free T4 tests. If the results are inconsistent with the TSH level, previous test results, or the clinical presentation, then consider biotin interference. If needed, order repeat testing after stopping biotin. Performed at [facility], [place] [address], [place], [place] [address]    Renal function panel     Status: Abnormal   Collection Time: 10/24/22 10:42 AM  Result Value Ref Range   Sodium 133 (L) 135 - 145 mmol/L   Potassium 3.8 3.5 - 5.1 mmol/L   Chloride 96 (L) 98 - 111 mmol/L   CO2 26 22 - 32 mmol/L   Glucose, Bld 88 70 - 99 mg/dL    Comment: Glucose reference range applies only to samples taken after fasting for at least 8 hours.   BUN 35 (H) 8 - 23 mg/dL   Creatinine, Ser 4.87 (H) 0.44 - 1.00 mg/dL   Calcium 7.7 (L) 8.9 - 10.3 mg/dL   Phosphorus 3.7 2.5 - 4.6 mg/dL   Albumin 2.7 (L) 3.5 - 5.0 g/dL   GFR, Estimated 9 (L) >60 mL/min    Comment: (NOTE) Calculated using the CKD-EPI Creatinine Equation (2021)    Anion gap 11 5 - 15    Comment: Performed at [facility] [address], [place], [place] [address]  Hepatitis B surface antigen     Status: None   Collection Time: 10/24/22 10:42 AM  Result Value Ref Range   Hepatitis B Surface Ag NON REACTIVE NON REACTIVE    Comment: Performed at [facility] [address], [place], [place] [address]  C-reactive protein     Status: Abnormal   Collection Time: 10/24/22 10:42 AM  Result Value Ref Range   CRP 5.2 (H) <1.0 mg/dL    Comment: Performed at [facility] [address], [place], [place] [address]  CBG monitoring, ED     Status: None   Collection Time: 10/24/22 11:40 AM  Result Value Ref Range   Glucose-Capillary 95 70 - 99 mg/dL    Comment: Glucose reference range applies only to samples taken after fasting for at least 8 hours.  CBG monitoring, ED     Status: Abnormal   Collection Time: 10/24/22  4:42 PM  Result Value Ref Range   Glucose-Capillary 102 (H) 70 - 99 mg/dL    Comment: Glucose reference range applies only to samples taken after fasting for at least 8 hours.  Sedimentation rate     Status: Abnormal   Collection Time: 10/24/22  4:51 PM  Result Value Ref Range   Sed Rate 45 (H) 0 - 22 mm/hr    Comment: Performed at [facility] [address]  [place], [place] [address]  Urinalysis, Routine w reflex microscopic     Status: Abnormal   Collection Time: 10/24/22  5:26 PM  Result Value Ref Range   Color, Urine AMBER (A) YELLOW    Comment: BIOCHEMICALS MAY BE AFFECTED BY COLOR   APPearance TURBID (A) CLEAR   Specific Gravity, Urine 1.018 1.005 - 1.030   pH 6.0 5.0 - 8.0   Glucose, UA NEGATIVE NEGATIVE mg/dL   Hgb urine dipstick SMALL (A) NEGATIVE   Bilirubin Urine NEGATIVE NEGATIVE   Ketones, ur NEGATIVE NEGATIVE mg/dL   Protein, ur 100 (A) NEGATIVE mg/dL   Nitrite NEGATIVE NEGATIVE   Leukocytes,Ua LARGE (A) NEGATIVE   RBC / HPF 0-5 0 - 5 RBC/hpf   WBC, UA >50 (H) 0 - 5 WBC/hpf   Bacteria, UA MANY (A) NONE SEEN   Squamous Epithelial / HPF 0-5 0 - 5 /HPF    Comment: Performed at [facility], [address], [place], [place] [address]  CBG monitoring, ED     Status: None   Collection Time: 10/24/22 11:01 PM  Result Value Ref Range   Glucose-Capillary 89 70 - 99 mg/dL    Comment: Glucose reference range applies only to samples taken after fasting for at least 8 hours.  CBC     Status: Abnormal   Collection Time: 10/25/22 12:11 AM  Result Value Ref Range   WBC 6.2 4.0 - 10.5 K/uL   RBC 3.22 (L) 3.87 - 5.11 MIL/uL   Hemoglobin 9.1 (L) 12.0 - 15.0 g/dL   HCT 29.3 (L) 36.0 - 46.0 %   MCV 91.0 80.0 - 100.0 fL   MCH 28.3 26.0 - 34.0 pg   MCHC 31.1 30.0 - 36.0 g/dL   RDW 18.3 (H) 11.5 - 15.5 %   Platelets 98 (L) 150 - 400 K/uL    Comment: REPEATED TO VERIFY   nRBC 0.0 0.0 - 0.2 %    Comment: Performed at [facility] [address], [place], [place] [address]  Renal function panel     Status: Abnormal   Collection Time: 10/25/22 12:11 AM  Result Value Ref Range   Sodium 134 (L) 135 - 145 mmol/L   Potassium 3.6 3.5 - 5.1 mmol/L   Chloride 97 (L) 98 - 111 mmol/L   CO2 25 22 - 32 mmol/L   Glucose, Bld 95  70 - 99 mg/dL    Comment: Glucose reference range applies only to samples taken after fasting for at least 8  hours.   BUN 14 8 - 23 mg/dL   Creatinine, Ser 2.49 (H) 0.44 - 1.00 mg/dL   Calcium 7.6 (L) 8.9 - 10.3 mg/dL   Phosphorus 2.3 (L) 2.5 - 4.6 mg/dL   Albumin 2.8 (L) 3.5 - 5.0 g/dL   GFR, Estimated 20 (L) >60 mL/min    Comment: (NOTE) Calculated using the CKD-EPI Creatinine Equation (2021)    Anion gap 12 5 - 15    Comment: Performed at [facility] [address] [place], [place] [address]  DIC Panel ONCE - STAT     Status: Abnormal   Collection Time: 10/25/22  6:28 AM  Result Value Ref Range   Prothrombin Time 14.2 11.4 - 15.2 seconds   INR 1.1 0.8 - 1.2    Comment: (NOTE) INR goal varies based on device and disease states.    aPTT 29 24 - 36 seconds   Fibrinogen 362 210 - 475 mg/dL    Comment: (NOTE) Fibrinogen results may be underestimated in patients receiving thrombolytic therapy.    D-Dimer, Quant 2.17 (H) 0.00 - 0.50 ug/mL-FEU    Comment: (NOTE) At the manufacturer cut-off value of 0.5 g/mL FEU, this assay has a negative predictive value of 95-100%.This assay is intended for use in conjunction with a clinical pretest probability (PTP) assessment model to exclude pulmonary embolism (PE) and deep venous thrombosis (DVT) in outpatients suspected of PE or DVT. Results should be correlated with clinical presentation.    Platelets 93 (L) 150 - 400 K/uL    Comment: REPEATED TO VERIFY   Smear Review NO SCHISTOCYTES SEEN     Comment: Performed at [facility], [place] [address], [place], [place] [address]  Lactate dehydrogenase     Status: None   Collection Time: 10/25/22  6:28 AM  Result Value Ref Range   LDH 177 98 - 192 U/L    Comment: Performed at [facility], [place] [address] [place], [place] [address]  CBG monitoring, ED     Status: Abnormal   Collection Time: 10/25/22  7:35 AM  Result Value Ref Range   Glucose-Capillary 109 (H) 70 - 99 mg/dL    Comment: Glucose reference range applies only to samples taken after fasting for at least 8 hours.  CBG  monitoring, ED     Status: Abnormal   Collection Time: 10/25/22  8:25 AM  Result Value Ref Range   Glucose-Capillary 107 (H) 70 - 99 mg/dL    Comment: Glucose reference range applies only to samples taken after fasting for at least 8 hours.   VAS [place] LOWER EXTREMITY VENOUS (DVT)  Result Date: 10/24/2022  Lower Venous DVT Study Patient Name:  RYLEI MASELLA  Date of Exam:   10/24/2022 Medical Rec #: [id number]         Accession #:    [id number] Date of Birth: 1946/01/07          Patient Gender: F Patient Age:   63 years Exam Location:  [facility] Procedure:      VAS [place] LOWER EXTREMITY VENOUS (DVT) Referring Phys: Fuller Plan --------------------------------------------------------------------------------  Indications: Swelling.  Comparison Study: Prior negative bilateral LEV done 07/22/22 Performing Technologist: Sharion Dove RVS  Examination Guidelines: A complete evaluation includes B-mode imaging, spectral Doppler, color Doppler, and power Doppler as needed of all accessible portions of  each vessel. Bilateral testing is considered an integral part of a complete examination. Limited examinations for reoccurring indications may be performed as noted. The reflux portion of the exam is performed with the patient in reverse Trendelenburg.  +---------+---------------+---------+-----------+----------+-------------------+ RIGHT    CompressibilityPhasicitySpontaneityPropertiesThrombus Aging      +---------+---------------+---------+-----------+----------+-------------------+ CFV      Full           Yes      Yes                                      +---------+---------------+---------+-----------+----------+-------------------+ SFJ      Full                                                             +---------+---------------+---------+-----------+----------+-------------------+ FV Prox  Full                                                              +---------+---------------+---------+-----------+----------+-------------------+ FV Mid   Full                                                             +---------+---------------+---------+-----------+----------+-------------------+ FV DistalFull                                                             +---------+---------------+---------+-----------+----------+-------------------+ PFV      Full                                                             +---------+---------------+---------+-----------+----------+-------------------+ POP      Full           Yes      Yes                                      +---------+---------------+---------+-----------+----------+-------------------+ PTV                                                   Not well visualized +---------+---------------+---------+-----------+----------+-------------------+ PERO  Not well visualized +---------+---------------+---------+-----------+----------+-------------------+   +---------+---------------+---------+-----------+----------+--------------+ LEFT     CompressibilityPhasicitySpontaneityPropertiesThrombus Aging +---------+---------------+---------+-----------+----------+--------------+ CFV      Full           Yes      Yes                                 +---------+---------------+---------+-----------+----------+--------------+ SFJ      Full                                                        +---------+---------------+---------+-----------+----------+--------------+ FV Prox  Full                                                        +---------+---------------+---------+-----------+----------+--------------+ FV Mid   Full                                                        +---------+---------------+---------+-----------+----------+--------------+ FV DistalFull                                                         +---------+---------------+---------+-----------+----------+--------------+ PFV      Full                                                        +---------+---------------+---------+-----------+----------+--------------+ POP      Full           Yes      Yes                                 +---------+---------------+---------+-----------+----------+--------------+ PTV      Full                                                        +---------+---------------+---------+-----------+----------+--------------+ PERO     Full                                                        +---------+---------------+---------+-----------+----------+--------------+     Summary: BILATERAL: - No evidence of deep vein thrombosis seen in the lower extremities, bilaterally. - RIGHT: - No cystic structure found in the popliteal fossa.  LEFT: - No cystic structure found in the popliteal  fossa.  *See table(s) above for measurements and observations. Electronically signed by Orlie Pollen on 10/24/2022 at 6:47:57 PM.    Final    CT Head Wo Contrast  Result Date: 10/24/2022 CLINICAL DATA:  77 year old female with jerking, shaking. EXAM: CT HEAD WITHOUT CONTRAST TECHNIQUE: Contiguous axial images were obtained from the base of the skull through the vertex without intravenous contrast. RADIATION DOSE REDUCTION: This exam was performed according to the departmental dose-optimization program which includes automated exposure control, adjustment of the mA and/or kV according to patient size and/or use of iterative reconstruction technique. COMPARISON:  Brain MRI 07/21/2022.  Head CT 07/03/2020. FINDINGS: Brain: Cerebral volume is not significantly changed since 2021, within normal limits for age. 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 No midline shift, ventriculomegaly, mass effect, evidence of mass lesion, intracranial hemorrhage or evidence of  cortically based acute infarction. Gray-white matter differentiation is stable and within normal limits for age. Vascular: Calcified atherosclerosis at the skull base. No suspicious intracranial vascular hyperdensity. Skull: No acute osseous abnormality identified. Sinuses/Orbits: Visualized paranasal sinuses and mastoids are stable and well aerated. Other: No acute orbit or scalp soft tissue finding. IMPRESSION: Stable and normal for age non contrast CT appearance of the brain. Electronically Signed   By: Genevie Ann M.D.   On: 10/24/2022 05:18   DG Chest Port 1 View  Result Date: 10/24/2022 CLINICAL DATA:  77 year old female missed dialysis. EXAM: PORTABLE CHEST 1 VIEW COMPARISON:  Portable chest 10/19/2022 and earlier. FINDINGS: Portable AP semi upright view at 0400 hours. Stable lung volumes. Stable cardiac size and mediastinal contours. On going left lung base hypo ventilation which appears in part related to small pleural effusion. Increased right lung and lung base veiling opacity and blunting of the right costophrenic angle. No superimposed pneumothorax, pulmonary edema, or consolidation. Paucity of bowel gas in the visible abdomen. Stable visualized osseous structures. Both glenohumeral joints are chronically dislocated since at least 07/21/2022. IMPRESSION: 1. Small bilateral pleural effusions, increased on the right since 10/19/2022. 2. No other acute cardiopulmonary abnormality. 3. Chronic bilateral glenohumeral joint dislocations. Electronically Signed   By: Genevie Ann M.D.   On: 10/24/2022 04:31    Pending Labs Unresulted Labs (From admission, onward)     Start     Ordered   10/26/22 0500  CBC with Differential/Platelet  Daily,   R     Question:  Specimen collection method  Answer:  Lab=Lab collect   10/25/22 0555   10/26/22 0500  Brain natriuretic peptide  Daily,   R     Question:  Specimen collection method  Answer:  Lab=Lab collect   10/25/22 0555   10/26/22 0500  Magnesium  Daily,   R      Question:  Specimen collection method  Answer:  Lab=Lab collect   10/25/22 0555   10/25/22 0500  Renal function panel  Daily,   R      10/24/22 0739   10/24/22 1800  Hepatitis B surface antibody,quantitative  Once,   AD        10/24/22 1800   10/24/22 0913  Gastrointestinal Panel by PCR , Stool  (Gastrointestinal Panel by PCR, Stool                                                                                                                                                     **  Does Not include CLOSTRIDIUM DIFFICILE testing. **If CDIFF testing is needed, place order from the "C Difficile Testing" order set.**)  Once,   R        10/24/22 0912   10/24/22 0912  C Difficile Quick Screen w PCR reflex  (C Difficile quick screen w PCR reflex panel )  Once, for 24 hours,   TIMED       References:    CDiff Information Tool   10/24/22 0912   10/24/22 0748  Occult blood card to lab, stool  Once,   R        10/24/22 0747            Vitals/Pain Today's Vitals   10/25/22 0400 10/25/22 0600 10/25/22 0700 10/25/22 0735  BP: (!) 145/68 (!) 139/48 (!) 153/65   Pulse: 69  (!) 58   Resp: '12 15 12   '$ Temp:    98 F (36.7 C)  TempSrc:      SpO2: 97% 99% 99%   PainSc:    Asleep    Isolation Precautions Enteric precautions (UV disinfection)  Medications Medications  0.9 %  sodium chloride infusion (Manually program via Guardrails IV Fluids) (has no administration in time range)  sodium chloride flush (NS) 0.9 % injection 3 mL (3 mLs Intravenous Given 10/25/22 0909)  acetaminophen (TYLENOL) tablet 650 mg (has no administration in time range)    Or  acetaminophen (TYLENOL) suppository 650 mg (has no administration in time range)  albuterol (PROVENTIL) (2.5 MG/3ML) 0.083% nebulizer solution 2.5 mg (has no administration in time range)  ondansetron (ZOFRAN) tablet 4 mg ( Oral See Alternative 10/24/22 2249)    Or  ondansetron (ZOFRAN) injection 4 mg (4 mg Intravenous Given 10/24/22 2249)   pantoprazole (PROTONIX) EC tablet 40 mg (40 mg Oral Not Given 10/25/22 0909)  insulin aspart (novoLOG) injection 0-5 Units ( Subcutaneous Not Given 10/24/22 2301)  insulin aspart (novoLOG) injection 0-6 Units ( Subcutaneous Not Given 10/25/22 0753)  dextrose 50 % solution 50 mL (has no administration in time range)  Chlorhexidine Gluconate Cloth 2 % PADS 6 each (6 each Topical Not Given 10/25/22 0504)  pentafluoroprop-tetrafluoroeth (GEBAUERS) aerosol 1 Application (has no administration in time range)  lidocaine (PF) (XYLOCAINE) 1 % injection 5 mL (has no administration in time range)  lidocaine-prilocaine (EMLA) cream 1 Application (has no administration in time range)  heparin injection 1,000 Units (has no administration in time range)  anticoagulant sodium citrate solution 5 mL (has no administration in time range)  alteplase (CATHFLO ACTIVASE) injection 2 mg (has no administration in time range)  calcitRIOL (ROCALTROL) capsule 1.25 mcg (1.25 mcg Oral Given 10/24/22 1700)  heparin injection 1,000 Units (has no administration in time range)  buprenorphine (BUTRANS) 10 MCG/HR 1 patch (1 patch Transdermal Not Given 10/24/22 2253)  atorvastatin (LIPITOR) tablet 20 mg (0 mg Oral Hold 10/25/22 0909)  hydrOXYzine (ATARAX) tablet 50 mg (has no administration in time range)  mirtazapine (REMERON) tablet 15 mg (15 mg Oral Not Given 10/24/22 2251)  simethicone (MYLICON) chewable tablet 120 mg (has no administration in time range)  tamsulosin (FLOMAX) capsule 0.4 mg (0 mg Oral Hold 10/24/22 2251)  melatonin tablet 5 mg (5 mg Oral Not Given 10/24/22 2251)  gabapentin (NEURONTIN) capsule 300 mg (300 mg Oral Not Given 10/24/22 2251)  fluticasone (FLONASE) 50 MCG/ACT nasal spray 1 spray (has no administration in time range)  rOPINIRole (REQUIP XL) 24 hr tablet 12 mg (12 mg Oral Not Given 10/25/22 0909)  nystatin (MYCOSTATIN/NYSTOP) topical powder 1 Application (1 Application Topical Given 10/25/22 0910)   camphor-menthol (SARNA) lotion 1 Application (1 Application Topical Not Given 10/25/22 0910)  carbidopa-levodopa (SINEMET IR) 25-100 MG per tablet immediate release 2 tablet (2 tablets Oral Not Given 10/25/22 0650)    And  carbidopa-levodopa (SINEMET IR) 25-100 MG per tablet immediate release 1 tablet (1 tablet Oral Not Given 10/24/22 2251)  midodrine (PROAMATINE) tablet 20 mg (20 mg Oral Given 10/24/22 1635)  heparin sodium (porcine) injection 3,000 Units (3,000 Units Intravenous Not Given 10/24/22 2252)  pentafluoroprop-tetrafluoroeth (GEBAUERS) aerosol (  Not Given 10/24/22 2253)  promethazine (PHENERGAN) 12.5 mg in sodium chloride 0.9 % 50 mL IVPB (has no administration in time range)  hydrALAZINE (APRESOLINE) injection 10 mg (has no administration in time range)  heparin sodium (porcine) injection 2,500 Units (2,500 Units Intravenous Given 10/24/22 2000)    Mobility Low fall risk   Focused Assessments Neuro Assessment Handoff:  Swallow screen pass? No  Cardiac Rhythm: Sinus bradycardia       Neuro Assessment: Within Defined Limits Neuro Checks:      Has TPA been given? No If patient is a Neuro Trauma and patient is going to OR before floor call report to [facility] nurse: [phone] or [phone]   R Recommendations: See Admitting Provider Note  Report given to:   Additional Notes:

## 2022-10-25 NOTE — ED Notes (Signed)
Patient refused Phenergan stating "I can't take that". Pt unable to elaborate why

## 2022-10-25 NOTE — Anesthesia Preprocedure Evaluation (Addendum)
Anesthesia Evaluation  Patient identified by MRN, date of birth, ID band Patient confused    Reviewed: Allergy & Precautions, NPO status , Patient's Chart, lab work & pertinent test results  Airway Mallampati: III  TM Distance: >3 FB Neck ROM: Full    Dental no notable dental hx.    Pulmonary sleep apnea , former smoker   Pulmonary exam normal        Cardiovascular hypertension,  Rhythm:Regular Rate:Normal     Neuro/Psych   Anxiety     negative neurological ROS     GI/Hepatic Neg liver ROS,GERD  Medicated,,  Endo/Other  diabetes    Renal/GU   negative genitourinary   Musculoskeletal  (+) Arthritis , Osteoarthritis,    Abdominal Normal abdominal exam  (+)   Peds  Hematology  (+) Blood dyscrasia, anemia Lab Results      Component                Value               Date                      WBC                      6.2                 10/25/2022                HGB                      9.1 (L)             10/25/2022                HCT                      29.3 (L)            10/25/2022                MCV                      91.0                10/25/2022                PLT                      93 (L)              10/25/2022             Lab Results      Component                Value               Date                      NA                       134 (L)             10/25/2022                K  3.6                 10/25/2022                CO2                      25                  10/25/2022                GLUCOSE                  95                  10/25/2022                BUN                      14                  10/25/2022                CREATININE               2.49 (H)            10/25/2022                CALCIUM                  7.6 (L)             10/25/2022                GFRNONAA                 20 (L)              10/25/2022              Anesthesia Other Findings    Reproductive/Obstetrics                             Anesthesia Physical Anesthesia Plan  ASA: 4  Anesthesia Plan: MAC   Post-op Pain Management:    Induction: Intravenous  PONV Risk Score and Plan: Propofol infusion and Treatment may vary due to age or medical condition  Airway Management Planned: Simple Face Mask, Natural Airway and Nasal Cannula  Additional Equipment: None  Intra-op Plan:   Post-operative Plan:   Informed Consent: I have reviewed the patients History and Physical, chart, labs and discussed the procedure including the risks, benefits and alternatives for the proposed anesthesia with the patient or authorized representative who has indicated his/her understanding and acceptance.     Dental advisory given  Plan Discussed with: CRNA  Anesthesia Plan Comments: (- Patient with witnessed seizure in pre-op area. Known recent seizure with current neurology work-up. Self and Dr. Havery Moros at bedside, 2.105m midazolam given with seizure break. Will cancel at this time and admit patient to CC. )       Anesthesia Quick Evaluation

## 2022-10-25 NOTE — Progress Notes (Signed)
PT Cancellation Note  Patient Details Name: Jean Mcclain MRN: [id number] DOB: [birth year]   Cancelled Treatment:    Reason Eval/Treat Not Completed: Medical issues which prohibited therapy Pt with recent seizure 1010 this morning.  Will hold PT today and f/u later date.  Abran Richard, PT Acute Rehab [facility] [phone]   Karlton Lemon 10/25/2022, 12:23 PM

## 2022-10-25 NOTE — Progress Notes (Signed)
[facility] Progress Note   Subjective:  Seen and examined in the ED. Very drowsy- difficult to rouse.  Completed dialysis last night - net UF  2L  Had seizure in endoscopy suite this am - procedure canceled   Objective Vitals:   10/25/22 1100 10/25/22 1115 10/25/22 1130 10/25/22 1142  BP: (!) 151/58 (!) 131/56 (!) 126/56   Pulse: (!) 56 (!) 59 (!) 56   Resp: '17 18 18   '$ Temp:    98.3 F (36.8 C)  TempSrc:    Axillary  SpO2: 100% 100% 100%        Additional Objective Labs: Basic Metabolic Panel: Recent Labs  Lab 10/24/22 0404 10/24/22 1042 10/25/22 0011  NA 135 133* 134*  K 3.8 3.8 3.6  CL 96* 96* 97*  CO2 '27 26 25  '$ GLUCOSE 82 88 95  BUN 34* 35* 14  CREATININE 4.74* 4.87* 2.49*  CALCIUM 7.8* 7.7* 7.6*  PHOS 3.8 3.7 2.3*   CBC: Recent Labs  Lab 10/19/22 2127 10/24/22 0404 10/25/22 0011 10/25/22 0628  WBC 8.1 6.7 6.2  --   NEUTROABS 7.5 5.3  --   --   HGB 9.0* 7.3* 9.1*  --   HCT 29.7* 24.7* 29.3*  --   MCV 91.4 92.2 91.0  --   PLT 165 118* 98* 93*   Blood Culture    Component Value Date/Time   SDES BLOOD LEFT FOREARM 09/29/2022 1716   SPECREQUEST  09/29/2022 1716    BOTTLES DRAWN AEROBIC AND ANAEROBIC Blood Culture adequate volume   CULT  09/29/2022 1716    NO GROWTH 5 DAYS Performed at [facility], [place] [address] [place], [place] [address]    REPTSTATUS 10/04/2022 FINAL 09/29/2022 1716     Physical Exam General: Lying in stretcher, nad  Heart: RRR  Lungs: Clear, upper airway noise Abdomen: soft non-tender  Extremities: No sig LE edema  Dialysis Access: R AVF +bruit   Medications:  anticoagulant sodium citrate     lactated ringers 75 mL/hr at 10/25/22 1123   levETIRAcetam     promethazine (PHENERGAN) injection (IM or IVPB)      sodium chloride   Intravenous Once   atorvastatin  20 mg Oral Daily   buprenorphine  1 patch Transdermal Weekly   calcitRIOL  1.25 mcg Oral Q M,W,F-HD   camphor-menthol  1 Application  Topical Daily   carbidopa-levodopa  2 tablet Oral q AM   And   carbidopa-levodopa  1 tablet Oral BID   Chlorhexidine Gluconate Cloth  6 each Topical Q0600   gabapentin  300 mg Oral QHS   heparin sodium (porcine)  3,000 Units Intravenous Once in dialysis   insulin aspart  0-5 Units Subcutaneous QHS   insulin aspart  0-6 Units Subcutaneous TID WC   melatonin  5 mg Oral QHS   midodrine  20 mg Oral Q M,W,F-HD   mirtazapine  15 mg Oral QHS   nystatin  1 Application Topical Daily   pantoprazole  40 mg Oral BID   rOPINIRole  12 mg Oral Daily   sodium chloride flush  3 mL Intravenous Q12H   tamsulosin  0.4 mg Oral QHS    Dialysis Orders: [facility] MWF 3:15h  67.9 kg 3K/2Ca  AVF Hep 3000  Assessment/Plan: 1 Myoclonus/ jerking: possibly gabapentin toxicity- CT head negative.  Elytes OK.  Stop gabapentin.  Consider MRI- per primary.   2 Seizure event today  - neuro consulted  3 ESRD: MWF [facility],  Next HD 1/17 4 Hypertension: BP/volume ok.  meds at home 5. Anemia of ESRD:  s/p 1 u pRBCs 1/15 will give ESA as appropriate 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  Dispo: admitted  Lynnda Child PA-C [facility] 10/25/2022,1:12 PM

## 2022-10-25 NOTE — ED Provider Notes (Signed)
I was informed by nursing staff the patient may have had a generalized tonic-clonic seizure.  On my arrival to the room she was awake and alert but appeared confused.  She was following commands.  Patient apparently has a history of myoclonus and seen by neurology previously.  Discussed the case with Dr. Letha Cape recommends routine EEG later today while she is awaiting admission.  She already had a negative CT head.  No other recommendations at this time   Ripley Fraise, MD 10/25/22 0403

## 2022-10-25 NOTE — Interval H&P Note (Signed)
History and Physical Interval Note: Patient continues to feel nauseated, wretching this AM. She denies any pain. Scheduled for EGD to further evaluate nausea / vomiting, anemia. She had some seizure like activity in the ED overnight which was short lived. CT head unremarkable. EEG pending per Neuro. She feels at baseline this AM. She wants to proceed with EGD after discussion of risks / benefits. Discussed overnight events with anesthesia and they feel safe to proceed with anesthesia. She had HD yesterday. Further recommendations pending results.   10/25/2022 10:06 AM  Jean Mcclain  has presented today for surgery, with the diagnosis of Chronic nausea with scant clear emesis.  Food aversion..  The various methods of treatment have been discussed with the patient and family. After consideration of risks, benefits and other options for treatment, the patient has consented to  Procedure(s): ESOPHAGOGASTRODUODENOSCOPY (EGD) WITH PROPOFOL (N/A) as a surgical intervention.  The patient's history has been reviewed, patient examined, no change in status, stable for surgery.  I have reviewed the patient's chart and labs.  Questions were answered to the patient's satisfaction.     [place]

## 2022-10-25 NOTE — Progress Notes (Signed)
PROGRESS NOTE                                                                                                                                                                                                             Patient Demographics:    Jean Mcclain, is a 77 y.o. female, DOB - 10-01-1946, IRJ:[id number]  Outpatient Primary MD for the patient is Pcp, No    LOS - 0  Admit date - 10/24/2022    Chief Complaint  Patient presents with   Shaking       Brief Narrative (HPI from H&P)   77 y.o. female with medical history significant of hypertension, hyperlipidemia, diabetes mellitus type II, ESRD on HD(M/W/F), chronic respiratory failure with hypoxia on 3 L of oxygen at night, Parkinson's disease, history of DVT not on anticoagulation, and wheelchair-bound due to left ankle fracture who presents for worsening shaking and jerking over the last 4 days or so.  She is recently admitted for diverticulitis with suspicion for mild contained perforation a month ago, she was also being worked up for an outpatient colonoscopy for possible occult GI bleed.  During her presentation in the ER she was found to be profoundly anemic, Hemoccult was negative, she was transfused with 1 unit of packed RBC in the ER subsequently she had a witnessed seizure-like episode as well.  This morning she is symptom-free she has been seen by GI and neurology has been consulted as well.  Her head CT thus far has been unremarkable and EEG is pending.    Subjective:    Tyquasia Pant today has, No headache, No chest pain, No abdominal pain - No Nausea, No new weakness tingling or numbness, no SOB   Assessment  & Plan :    Symptomatic acute on chronic anemia.  Possible occult GI blood loss with recent history of diverticulitis and pending outpatient colonoscopy.  Workup negative for DIC, LDH stable ruling out any intravascular hemolysis, she is s/p 1 unit of  packed RBC transfusion on 10/24/2022 in the ER, she is on PPI, of note she is currently being worked for possible diverticular mass versus persistent diverticulitis and is due for colonoscopy.  Defer further management of GI issues to GI team.  Continue to monitor H&H.  For any antibiotic to the GI team as well.  Currently abdominal exam  is nontender and she does not complain of any abdominal pain.  Possible seizure-like episode in the ER witnessed by the nursing staff.  EEG pending, head CT negative, this morning no headache or focal deficits, neurology consulted.  Will monitor.  Acute on chronic hypoxic respiratory failure with underlying OSA.  She normally uses 3 L of nasal cannula oxygen, likely some worsening due to anemia, supportive care, much improved this morning.  Continue to monitor.  ESRD on HD.  Patient on MWF schedule.  Nephrology on board.  History of DVT in 2018.  Not on anticoagulation anymore, repeat lower extremity venous ultrasound ordered during admission remains negative.  History of Parkinson's disease.  Continue home regimen.  History of sick euthyroid syndrome.  Stable TSH now.  Mild persistent thrombocytopenia.  Stable outpatient workup.  Kidney lesion - Noted to be increased in size on CT scan from 07/2022 for which MRI of the abdomen recommended in 6 months at that time.  DM type II.  Currently on sliding scale monitor and adjust.  Lab Results  Component Value Date   HGBA1C 6.9 (H) 07/22/2022   CBG (last 3)  Recent Labs    10/24/22 1642 10/24/22 2301 10/25/22 0735  GLUCAP 102* 89 109*        Condition - Extremely Guarded  Family Communication  :  None  Code Status : DNR  Consults  :  GI, Renal  PUD Prophylaxis : PPI   Procedures  :     Lower extremity venous duplex.  No DVT.        Disposition Plan  :    Status is: Observation   DVT Prophylaxis  :  SCD  Place TED hose Start: 10/24/22 0737  Lab Results  Component Value Date    PLT 93 (L) 10/25/2022    Diet :  Diet Order             Diet NPO time specified Except for: Sips with Meds  Diet effective midnight                    Inpatient Medications  Scheduled Meds:  sodium chloride   Intravenous Once   atorvastatin  20 mg Oral Daily   buprenorphine  1 patch Transdermal Weekly   calcitRIOL  1.25 mcg Oral Q M,W,F-HD   camphor-menthol  1 Application Topical Daily   carbidopa-levodopa  2 tablet Oral q AM   And   carbidopa-levodopa  1 tablet Oral BID   Chlorhexidine Gluconate Cloth  6 each Topical Q0600   gabapentin  300 mg Oral QHS   heparin sodium (porcine)  3,000 Units Intravenous Once in dialysis   insulin aspart  0-5 Units Subcutaneous QHS   insulin aspart  0-6 Units Subcutaneous TID WC   melatonin  5 mg Oral QHS   midodrine  20 mg Oral Q M,W,F-HD   mirtazapine  15 mg Oral QHS   nystatin  1 Application Topical Daily   pantoprazole  40 mg Oral BID   rOPINIRole  12 mg Oral Daily   sodium chloride flush  3 mL Intravenous Q12H   tamsulosin  0.4 mg Oral QHS   Continuous Infusions:  anticoagulant sodium citrate     promethazine (PHENERGAN) injection (IM or IVPB)     PRN Meds:.acetaminophen **OR** acetaminophen, albuterol, alteplase, anticoagulant sodium citrate, dextrose, fluticasone, heparin, heparin, hydrOXYzine, lidocaine (PF), lidocaine-prilocaine, ondansetron **OR** ondansetron (ZOFRAN) IV, pentafluoroprop-tetrafluoroeth, promethazine (PHENERGAN) injection (IM or IVPB), simethicone  Antibiotics  :  Anti-infectives (From admission, onward)    None         Objective:   Vitals:   10/25/22 0400 10/25/22 0600 10/25/22 0700 10/25/22 0735  BP: (!) 145/68 (!) 139/48 (!) 153/65   Pulse: 69  (!) 58   Resp: '12 15 12   '$ Temp:    98 F (36.7 C)  TempSrc:      SpO2: 97% 99% 99%     Wt Readings from Last 3 Encounters:  10/19/22 70.8 kg  09/29/22 70.8 kg  08/30/22 70.8 kg     Intake/Output Summary (Last 24 hours) at 10/25/2022  0805 Last data filed at 10/24/2022 2215 Gross per 24 hour  Intake 360 ml  Output 2000 ml  Net -1640 ml     Physical Exam  Awake Alert, No new F.N deficits, Normal affect [place].AT,PERRAL Supple Neck, No JVD,   Symmetrical Chest wall movement, Good air movement bilaterally, CTAB RRR,No Gallops,Rubs or new Murmurs,  +ve B.Sounds, Abd Soft, No tenderness,   No Cyanosis, Clubbing or edema     RN pressure injury documentation:      Data Review:    Recent Labs  Lab 10/19/22 2127 10/24/22 0404 10/25/22 0011 10/25/22 0628  WBC 8.1 6.7 6.2  --   HGB 9.0* 7.3* 9.1*  --   HCT 29.7* 24.7* 29.3*  --   PLT 165 118* 98* 93*  MCV 91.4 92.2 91.0  --   MCH 27.7 27.2 28.3  --   MCHC 30.3 29.6* 31.1  --   RDW 19.4* 19.7* 18.3*  --   LYMPHSABS 0.3* 0.5*  --   --   MONOABS 0.2 0.3  --   --   EOSABS 0.0 0.5  --   --   BASOSABS 0.0 0.0  --   --     Recent Labs  Lab 10/19/22 2127 10/24/22 0404 10/24/22 1042 10/25/22 0011 10/25/22 0628  NA 137 135 133* 134*  --   K 4.1 3.8 3.8 3.6  --   CL 97* 96* 96* 97*  --   CO2 '30 27 26 25  '$ --   ANIONGAP '10 12 11 12  '$ --   GLUCOSE 96 82 88 95  --   BUN 10 34* 35* 14  --   CREATININE 2.03* 4.74* 4.87* 2.49*  --   AST 6* 7*  --   --   --   ALT <5 5  --   --   --   ALKPHOS 120 96  --   --   --   BILITOT 0.7 0.8  --   --   --   ALBUMIN 3.2* 2.7* 2.7* 2.8*  --   CRP  --   --  5.2*  --   --   DDIMER  --   --   --   --  2.17*  INR  --   --   --   --  1.1  TSH  --   --  2.455  --   --   BNP 929.4*  --   --   --   --   MG  --  1.7  --   --   --   CALCIUM 8.5* 7.8* 7.7* 7.6*  --       Recent Labs  Lab 10/19/22 2127 10/24/22 0404 10/24/22 1042 10/25/22 0011 10/25/22 0628  CRP  --   --  5.2*  --   --   DDIMER  --   --   --   --  2.17*  INR  --   --   --   --  1.1  TSH  --   --  2.455  --   --   BNP 929.4*  --   --   --   --   MG  --  1.7  --   --   --   CALCIUM 8.5* 7.8* 7.7* 7.6*  --      Radiology Reports VAS [place] LOWER EXTREMITY  VENOUS (DVT)  Result Date: 10/24/2022  Lower Venous DVT Study Patient Name:  ADDALYNNE GOLDING  Date of Exam:   10/24/2022 Medical Rec #: [id number]         Accession #:    [id number] Date of Birth: 1946/05/20          Patient Gender: F Patient Age:   35 years Exam Location:  [facility] Procedure:      VAS [place] LOWER EXTREMITY VENOUS (DVT) Referring Phys: Fuller Plan --------------------------------------------------------------------------------  Indications: Swelling.  Comparison Study: Prior negative bilateral LEV done 07/22/22 Performing Technologist: Sharion Dove RVS  Examination Guidelines: A complete evaluation includes B-mode imaging, spectral Doppler, color Doppler, and power Doppler as needed of all accessible portions of each vessel. Bilateral testing is considered an integral part of a complete examination. Limited examinations for reoccurring indications may be performed as noted. The reflux portion of the exam is performed with the patient in reverse Trendelenburg.  +---------+---------------+---------+-----------+----------+-------------------+ RIGHT    CompressibilityPhasicitySpontaneityPropertiesThrombus Aging      +---------+---------------+---------+-----------+----------+-------------------+ CFV      Full           Yes      Yes                                      +---------+---------------+---------+-----------+----------+-------------------+ SFJ      Full                                                             +---------+---------------+---------+-----------+----------+-------------------+ FV Prox  Full                                                             +---------+---------------+---------+-----------+----------+-------------------+ FV Mid   Full                                                             +---------+---------------+---------+-----------+----------+-------------------+ FV DistalFull                                                              +---------+---------------+---------+-----------+----------+-------------------+ PFV      Full                                                             +---------+---------------+---------+-----------+----------+-------------------+  POP      Full           Yes      Yes                                      +---------+---------------+---------+-----------+----------+-------------------+ PTV                                                   Not well visualized +---------+---------------+---------+-----------+----------+-------------------+ PERO                                                  Not well visualized +---------+---------------+---------+-----------+----------+-------------------+   +---------+---------------+---------+-----------+----------+--------------+ LEFT     CompressibilityPhasicitySpontaneityPropertiesThrombus Aging +---------+---------------+---------+-----------+----------+--------------+ CFV      Full           Yes      Yes                                 +---------+---------------+---------+-----------+----------+--------------+ SFJ      Full                                                        +---------+---------------+---------+-----------+----------+--------------+ FV Prox  Full                                                        +---------+---------------+---------+-----------+----------+--------------+ FV Mid   Full                                                        +---------+---------------+---------+-----------+----------+--------------+ FV DistalFull                                                        +---------+---------------+---------+-----------+----------+--------------+ PFV      Full                                                        +---------+---------------+---------+-----------+----------+--------------+ POP      Full           Yes      Yes                                  +---------+---------------+---------+-----------+----------+--------------+  PTV      Full                                                        +---------+---------------+---------+-----------+----------+--------------+ PERO     Full                                                        +---------+---------------+---------+-----------+----------+--------------+     Summary: BILATERAL: - No evidence of deep vein thrombosis seen in the lower extremities, bilaterally. - RIGHT: - No cystic structure found in the popliteal fossa.  LEFT: - No cystic structure found in the popliteal fossa.  *See table(s) above for measurements and observations. Electronically signed by Orlie Pollen on 10/24/2022 at 6:47:57 PM.    Final    CT Head Wo Contrast  Result Date: 10/24/2022 CLINICAL DATA:  77 year old female with jerking, shaking. EXAM: CT HEAD WITHOUT CONTRAST TECHNIQUE: Contiguous axial images were obtained from the base of the skull through the vertex without intravenous contrast. RADIATION DOSE REDUCTION: This exam was performed according to the departmental dose-optimization program which includes automated exposure control, adjustment of the mA and/or kV according to patient size and/or use of iterative reconstruction technique. COMPARISON:  Brain MRI 07/21/2022.  Head CT 07/03/2020. FINDINGS: Brain: Cerebral volume is not significantly changed since 2021, within normal limits for age. 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 No midline shift, ventriculomegaly, mass effect, evidence of mass lesion, intracranial hemorrhage or evidence of cortically based acute infarction. Gray-white matter differentiation is stable and within normal limits for age. Vascular: Calcified atherosclerosis at the skull base. No suspicious intracranial vascular hyperdensity. Skull: No acute osseous abnormality identified. Sinuses/Orbits: Visualized  paranasal sinuses and mastoids are stable and well aerated. Other: No acute orbit or scalp soft tissue finding. IMPRESSION: Stable and normal for age non contrast CT appearance of the brain. Electronically Signed   By: Genevie Ann M.D.   On: 10/24/2022 05:18   DG Chest Port 1 View  Result Date: 10/24/2022 CLINICAL DATA:  77 year old female missed dialysis. EXAM: PORTABLE CHEST 1 VIEW COMPARISON:  Portable chest 10/19/2022 and earlier. FINDINGS: Portable AP semi upright view at 0400 hours. Stable lung volumes. Stable cardiac size and mediastinal contours. On going left lung base hypo ventilation which appears in part related to small pleural effusion. Increased right lung and lung base veiling opacity and blunting of the right costophrenic angle. No superimposed pneumothorax, pulmonary edema, or consolidation. Paucity of bowel gas in the visible abdomen. Stable visualized osseous structures. Both glenohumeral joints are chronically dislocated since at least 07/21/2022. IMPRESSION: 1. Small bilateral pleural effusions, increased on the right since 10/19/2022. 2. No other acute cardiopulmonary abnormality. 3. Chronic bilateral glenohumeral joint dislocations. Electronically Signed   By: Genevie Ann M.D.   On: 10/24/2022 04:31      Signature  -   Lala Lund M.D on 10/25/2022 at 8:05 AM   -  To page go to [email or web address]

## 2022-10-25 NOTE — Progress Notes (Signed)
OT Cancellation Note  Patient Details Name: Jean Mcclain MRN: [id number] DOB: Oct 30, 1945   Cancelled Treatment:    Reason Eval/Treat Not Completed: Medical issues which prohibited therapy Pt with seizures overnight and this AM. Will hold OT attempts and follow up tomorrow.   Layla Maw 10/25/2022, 11:47 AM

## 2022-10-25 NOTE — Progress Notes (Signed)
Pt receives out-pt HD at [place] on MWF. Pt arrives at 9:40 for 10:00 chair time. Will assist as needed.   Melven Sartorius Renal Navigator [phone]

## 2022-10-25 NOTE — OR Nursing (Signed)
Patient in endo pre waiting for egd. Spitting and gagging a lot, mostly just spit. Another RN had given zofran IV. Patient started having tics then a full seizure at 1010am, eyes rolled up and right. Called anesthesia MD to room, rolled patient to left, increased O2 and started to use ambu bag when patient turning cyanotic, but patient started breathing again. Placed patient on NRB and CRNA gave Versed 2.'5mg'$  IV per Dr Gloris Manchester, MDA. Seizing stopped. Kept patient on left side. Dr Havery Moros aware. EGD canceled. 157/73, 86, 100% NRB.

## 2022-10-25 NOTE — Consult Note (Signed)
Neurology Consultation  Reason for Consult: Seizure Referring Physician: Dr. Candiss Norse  CC: "My foot hurts"  History is obtained from: Chart review, unable to obtain from patient as she is a poor historian and lethargic on exam s/p Versed administration this morning  HPI: Jean Mcclain is a 77 y.o. 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 She has a history of restless leg syndrome and reports recent increased jerking and shaking mostly in her right leg and at times involves her entire body.  Early this morning, the bedside nurse reported witnessing a brief episode of patient jerking (unable to clarify characteristics from day shift RN) and expressed concern for potential seizure activity. ED physician notes that he evaluated the patient following the RN's concern and she was noted to be awake, alert, without further shaking but appeared confused. Patient was also taken for a possible EGD this morning and during the consent process, the gastroenterology provider witnessed the patient with seizure described as rhythmic jerking movements of both arms with her neck postured back and eyes rolled upward and rightward for approximately 2.5 minutes. Patient became cyanotic during the event which resolved with return of spontaneous respirations. Patient was given IV Versed with resolution of seizure activity and EGD was cancelled at that time.  In addition to increased shaking for 4 days, patient has been reporting nausea, some diarrhea, and dry heaves that have been persistent for a few months.  Recent hospitalizations in December 2023 we  will patient with diverticulitis with abscess requiring IV antibiotics with multiple evaluations for the same complaint. Patient has also had significant weight loss associated with her persistent nausea and her oxygen requirements have increased from nightly to continuously.  Patient is also anemic and symptomatic with a hemoglobin of 7.3 on 10/24/2022 requiring 1 unit PRBC.   Of note, patient has seen Dr. Brett Fairy outpatient with [facility] in February of 2021 for management of restless leg syndrome, leg cramping, and myoclonic jerking. Patient is on gabapentin at renal dosing, Requip, and Sinamet.   ROS: Unable to obtain due to patient drowsiness and poor attention and concentration.   Past Medical History:  Diagnosis Date   Acute and chronic respiratory failure (acute-on-chronic) (HCC)    Acute deep vein thrombosis (DVT) of popliteal vein of left lower extremity ([place]) 03/11/2017   Anxiety    Arthritis    osteo   Diabetes mellitus, type 2 ([place])    Type II   DVT (deep venous thrombosis) ([place]) 03/2017   left popliteal   ESRD on hemodialysis (HCC)    Facet joint disease    foot   Fibula fracture 02/2017   left   GERD (gastroesophageal reflux disease)    H/O blood clots    Hyperlipidemia    Hypertension    Macrocytic anemia    Morbid obesity (HCC)    OSA (obstructive sleep apnea)    does not use CPAP    Osteopenia    Renal disorder    MWF dialysis   Supplemental oxygen dependent    Venous stasis ulcers ([place])    Vitamin D deficiency    Past Surgical History:  Procedure Laterality Date   ANKLE FUSION Left 06/24/2020  Procedure: LEFT TIBIOCALCANEAL FUSION;  Surgeon: [name], MD;  Location: [facility];  Service: Orthopedics;  Laterality: Left;   AV FISTULA PLACEMENT Right 08/20/2018   Procedure: ARTERIOVENOUS (AV) FISTULA CREATION BRACHIOCEPHALIC;  Surgeon: Angelia Mould, MD;  Location: [facility];  Service: Vascular;  Laterality: Right;   BREAST SURGERY Right 03/1997   biospy    CARPAL TUNNEL RELEASE Left 2016   HYSTEROSCOPY WITH D & C N/A 09/29/2020   Procedure: DILATATION AND CURETTAGE /HYSTEROSCOPY;  Surgeon: Mora Bellman, MD;  Location: [facility];  Service: Gynecology;  Laterality: N/A;   INSERTION OF MESH N/A 01/08/2013   Procedure: INSERTION OF MESH;  Surgeon: Earnstine Regal, MD;  Location: [facility];  Service: General;  Laterality: N/A;   IR THORACENTESIS ASP PLEURAL SPACE W/IMG GUIDE  04/06/2017   JOINT REPLACEMENT Right 1999   TOTAL KNEE ARTHROPLASTY Left 1999   Left   TRIGGER FINGER RELEASE Left 11/2014   TRIGGER FINGER RELEASE Right    VENTRAL HERNIA REPAIR N/A 01/08/2013   Procedure: LAPAROSCOPIC VENTRAL HERNIA;  Surgeon: Earnstine Regal, MD;  Location: [facility];  Service: General;  Laterality: N/A;   Family History  Problem Relation Age of Onset   Leukemia Mother    Hypertension Brother    Emphysema Father    Social History:   reports that she quit smoking about 41 years ago. Her smoking use included cigarettes. She has never used smokeless tobacco. She reports that she does not drink alcohol and does not use drugs.  Medications  Current Facility-Administered Medications:    0.9 %  sodium chloride infusion (Manually program via Guardrails IV Fluids), , Intravenous, Once, Quintella Reichert, MD   acetaminophen (TYLENOL) tablet 650 mg, 650 mg, Oral, Q6H PRN **OR** acetaminophen (TYLENOL) suppository 650 mg, 650 mg, Rectal, Q6H PRN, Tamala Julian, Rondell A, MD   albuterol (PROVENTIL) (2.5 MG/3ML) 0.083% nebulizer solution 2.5 mg, 2.5 mg, Nebulization, Q6H PRN, Tamala Julian, Rondell A, MD   alteplase (CATHFLO ACTIVASE) injection 2 mg, 2 mg, Intracatheter, Once PRN, Madelon Lips, MD   anticoagulant sodium citrate solution 5 mL, 5 mL, Intracatheter, PRN, Madelon Lips, MD   atorvastatin (LIPITOR) tablet 20 mg, 20 mg, Oral, Daily, Smith, Rondell A, MD, 20 mg at 10/24/22 1635   buprenorphine (BUTRANS) 10 MCG/HR 1 patch, 1 patch, Transdermal, Weekly, Smith, Rondell A, MD    calcitRIOL (ROCALTROL) capsule 1.25 mcg, 1.25 mcg, Oral, Q M,W,F-HD, Madelon Lips, MD, 1.25 mcg at 10/24/22 1700   camphor-menthol (SARNA) lotion 1 Application, 1 Application, Topical, Daily, Smith, Rondell A, MD   carbidopa-levodopa (SINEMET IR) 25-100 MG per tablet immediate release 2 tablet, 2 tablet, Oral, q AM **AND** carbidopa-levodopa (SINEMET IR) 25-100 MG per tablet immediate release 1 tablet, 1 tablet, Oral, BID, Smith, Rondell A, MD   Chlorhexidine Gluconate Cloth 2 % PADS 6 each, 6 each, Topical, Q0600, Madelon Lips, MD   dextrose 50 % solution 50 mL, 1 ampule, Intravenous, PRN, Tamala Julian, Rondell A, MD   fluticasone (FLONASE) 50 MCG/ACT nasal spray 1 spray, 1 spray, Each Nare, BID PRN, Tamala Julian, Rondell A, MD   gabapentin (NEURONTIN) capsule 300 mg, 300 mg, Oral, QHS, Smith, Rondell A, MD   heparin injection 1,000 Units, 1,000 Units, Intracatheter, PRN, Madelon Lips, MD   heparin injection 1,000 Units, 1,000 Units, Dialysis, PRN, Madelon Lips, MD   heparin sodium (porcine) injection 3,000 Units, 3,000 Units, Intravenous, Once in dialysis, Madelon Lips, MD   hydrALAZINE (APRESOLINE) injection 10 mg, 10 mg, Intravenous, Q6H PRN,  Thurnell Lose, MD   hydrOXYzine (ATARAX) tablet 50 mg, 50 mg, Oral, BID PRN, Tamala Julian, Rondell A, MD   insulin aspart (novoLOG) injection 0-5 Units, 0-5 Units, Subcutaneous, QHS, Smith, Rondell A, MD   insulin aspart (novoLOG) injection 0-6 Units, 0-6 Units, Subcutaneous, TID WC, Smith, Rondell A, MD   lidocaine (PF) (XYLOCAINE) 1 % injection 5 mL, 5 mL, Intradermal, PRN, Madelon Lips, MD   lidocaine-prilocaine (EMLA) cream 1 Application, 1 Application, Topical, PRN, Madelon Lips, MD   melatonin tablet 5 mg, 5 mg, Oral, QHS, Smith, Rondell A, MD   midodrine (PROAMATINE) tablet 20 mg, 20 mg, Oral, Q M,W,F-HD, Tamala Julian, Rondell A, MD, 20 mg at 10/24/22 1635   mirtazapine (REMERON) tablet 15 mg, 15 mg, Oral, QHS, Smith, Rondell A, MD   nystatin  (MYCOSTATIN/NYSTOP) topical powder 1 Application, 1 Application, Topical, Daily, Tamala Julian, Rondell A, MD, 1 Application at 66/44/03 0910   ondansetron (ZOFRAN) tablet 4 mg, 4 mg, Oral, Q6H PRN **OR** ondansetron (ZOFRAN) injection 4 mg, 4 mg, Intravenous, Q6H PRN, Tamala Julian, Rondell A, MD, 4 mg at 10/24/22 2249   pantoprazole (PROTONIX) EC tablet 40 mg, 40 mg, Oral, BID, Smith, Rondell A, MD, 40 mg at 10/24/22 1142   pentafluoroprop-tetrafluoroeth (GEBAUERS) aerosol 1 Application, 1 Application, Topical, PRN, Madelon Lips, MD   promethazine (PHENERGAN) 12.5 mg in sodium chloride 0.9 % 50 mL IVPB, 12.5 mg, Intravenous, Once PRN, Opyd, Ilene Qua, MD   rOPINIRole (REQUIP XL) 24 hr tablet 12 mg, 12 mg, Oral, Daily, Smith, Rondell A, MD, 12 mg at 10/24/22 1659   simethicone (MYLICON) chewable tablet 120 mg, 120 mg, Oral, QID PRN, Tamala Julian, Rondell A, MD   sodium chloride flush (NS) 0.9 % injection 3 mL, 3 mL, Intravenous, Q12H, Smith, Rondell A, MD, 3 mL at 10/25/22 0909   tamsulosin (FLOMAX) capsule 0.4 mg, 0.4 mg, Oral, QHS, Smith, Rondell A, MD  Current Outpatient Medications:    acetaminophen (TYLENOL) 500 MG tablet, Take 2 tablets (1,000 mg total) by mouth 3 (three) times daily. (Patient taking differently: Take 1,000 mg by mouth 3 (three) times daily as needed (pain).), Disp: 30 tablet, Rfl: 0   albuterol (VENTOLIN HFA) 108 (90 Base) MCG/ACT inhaler, Inhale 2 puffs into the lungs every 6 (six) hours as needed for wheezing or shortness of breath., Disp: , Rfl:    atorvastatin (LIPITOR) 20 MG tablet, Take 20 mg by mouth daily., Disp: , Rfl:    buprenorphine (BUTRANS) 10 MCG/HR PTWK, Place 1 patch onto the skin once a week., Disp: , Rfl:    carbidopa-levodopa (SINEMET IR) 25-100 MG tablet, Take 1-2 tablets by mouth See admin instructions. 2 entries on MAR: Take 2 tablets by mouth every morning. Take 1 tablet by mouth twice daily., Disp: , Rfl:    docusate sodium (COLACE) 100 MG capsule, Take 200 mg by mouth  at bedtime. Hold for loose stools, Disp: , Rfl:    fluticasone (FLONASE) 50 MCG/ACT nasal spray, Place 1 spray into both nostrils 2 (two) times daily as needed for allergies., Disp: , Rfl:    gabapentin (NEURONTIN) 300 MG capsule, Take 300 mg by mouth at bedtime., Disp: , Rfl:    Glycerin, Laxative, (GLYCERIN, ADULT,) 2 g SUPP, Place 1 suppository rectally daily as needed for moderate constipation., Disp: 20 suppository, Rfl: 0   hydrOXYzine (ATARAX) 50 MG tablet, Take 50 mg by mouth 2 (two) times daily as needed (no indication given on MAR)., Disp: , Rfl:    insulin aspart (NOVOLOG  FLEXPEN) 100 UNIT/ML FlexPen, Inject 3-9 Units into the skin See admin instructions. Inject 3-9 units before meals and at bedtime per sliding scale: BS 70-100 : 3 units BS 101-150: 4 units BS 151-200: 5 units BS 201-250: 6 units BS 251-300: 7 units BS 301-350: 8 units BS 351-400: 9 units BS > 400 : call 911 HOLD on M-W-F when at dialysis, Disp: , Rfl:    loperamide (IMODIUM A-D) 2 MG tablet, Take 2 mg by mouth 2 (two) times daily as needed for diarrhea or loose stools., Disp: , Rfl:    melatonin 5 MG TABS, Take 5 mg by mouth at bedtime., Disp: , Rfl:    metoCLOPramide (REGLAN) 10 MG tablet, Take 1 tablet (10 mg total) by mouth every 6 (six) hours., Disp: 30 tablet, Rfl: 0   midodrine (PROAMATINE) 10 MG tablet, Take 10-20 mg by mouth See admin instructions. 20 mg every Monday, Wednesday, Friday prior to Hemodialysis and 10 mg half-way through hemodialysis., Disp: , Rfl:    mirtazapine (REMERON) 15 MG tablet, Take 15 mg by mouth at bedtime., Disp: , Rfl:    nystatin (NYAMYC) powder, Apply 1 Application topically daily. To groin and under breasts Kept in room, pt self applies, Disp: , Rfl:    ondansetron (ZOFRAN-ODT) 8 MG disintegrating tablet, Take 1 tablet (8 mg total) by mouth every 6 (six) hours. Scheduled while on abx and then PRN after abx complete (Patient taking differently: Take 8 mg by mouth every 6 (six) hours as  needed for nausea or vomiting.), Disp: 60 tablet, Rfl: 0   OXYGEN, Inhale 3 L into the lungs at bedtime as needed (SOB)., Disp: , Rfl:    pantoprazole (PROTONIX) 40 MG tablet, Take 1 tablet (40 mg total) by mouth 2 (two) times daily. BID for 14 days and then daily (Patient taking differently: Take 40 mg by mouth daily.), Disp: 60 tablet, Rfl: 0   polyethylene glycol powder (GLYCOLAX/MIRALAX) 17 GM/SCOOP powder, Take 17 g by mouth daily as needed (constipation)., Disp: , Rfl:    pramoxine (SARNA SENSITIVE) 1 % LOTN, Apply 1 Application topically daily. Kept in room, pt self applies, Disp: , Rfl:    promethazine (PHENERGAN) 12.5 MG suppository, Place 1 suppository (12.5 mg total) rectally every 6 (six) hours as needed for nausea or vomiting., Disp: 12 each, Rfl: 0   Ropinirole HCl 12 MG TB24, Take 12 mg by mouth daily., Disp: , Rfl:    Simethicone (GAS-X EXTRA STRENGTH PO), Take 1 tablet by mouth every 12 (twelve) hours as needed (heartburn, indigestion)., Disp: , Rfl:    tamsulosin (FLOMAX) 0.4 MG CAPS capsule, Take 1 capsule (0.4 mg total) by mouth daily. (Patient taking differently: Take 0.4 mg by mouth at bedtime.), Disp: 30 capsule, Rfl: 3   doxercalciferol (HECTOROL) 4 MCG/2ML injection, Inject 1 mL (2 mcg total) into the vein every Monday, Wednesday, and Friday with hemodialysis. (Patient not taking: Reported on 10/24/2022), Disp: 2 mL, Rfl:    insulin aspart (NOVOLOG) 100 UNIT/ML injection, Inject 0-5 Units into the skin at bedtime. (Patient not taking: Reported on 10/24/2022), Disp: 10 mL, Rfl: 11   Na Sulfate-K Sulfate-Mg Sulf 17.5-3.13-1.6 GM/177ML SOLN, Refer to Instruction sent to you for your colonoscopy (Patient not taking: Reported on 10/24/2022), Disp: 354 mL, Rfl: 0  Exam: Current vital signs: BP (!) 153/65   Pulse (!) 58   Temp 98 F (36.7 C)   Resp 12   SpO2 99%  Vital signs in last 24 hours: Temp:  [  97.6 F (36.4 C)-98.7 F (37.1 C)] 98 F (36.7 C) (01/16 0735) Pulse Rate:   [32-117] 58 (01/16 0700) Resp:  [10-20] 12 (01/16 0700) BP: (105-153)/(46-93) 153/65 (01/16 0700) SpO2:  [82 %-100 %] 99 % (01/16 0700)  GENERAL: Drowsy, wakes briefly to voice before drifting back to sleep, in no acute distress Psych: Flat affect with arousal Head: Normocephalic and atraumatic, without obvious abnormality EENT: Normal conjunctivae, dry mucous membranes, no OP obstruction LUNGS: Normal respiratory effort on nasal cannula CV: Sinus bradycardia with a rate in the 50's initially on exam ABDOMEN: Soft, non-tender, non-distended Extremities: Warm, well perfused, without obvious deformity  NEURO:  Mental Status: Drowsy, wakes briefly to voice before drifting back to sleep. Patient reportedly given Versed this morning with seizure-like event prior to EGD. Opens eyes minimally to voice. Oriented to self, age, initially states the month is December and then self corrects to January, oriented to place.  Incorrectly states that the year is 2020 and does not elaborate on cause of hospitalization.  Patient is a poor historian. Follows commands with repeat instruction. Poor attention and concentration.  Patient is able to name objects.   Speech is hypophonic without dysarthria.  Cranial Nerves:  II: PERRL. Visual fields full.  III, IV, VI: EOMI without gaze preference. V: Sensation is intact to light touch and symmetrical to face.  VII: Smile elevates symmetrically VIII: Hearing intact to voice IX, X: Palate elevation is symmetric.  Hypophonic XI: Normal sternocleidomastoid and trapezius muscle strength XII: Tongue protrudes midline  Motor: Generalized weakness throughout.  At baseline patient is wheelchair-bound. Bilateral lower extremities with minimal attempt at antigravity movement.  Patient does complain of foot pain on the left (chronic pain from her ankle fracture 2 years ago).  Bilateral upper extremities are symmetrically weak.  Patient with minimal attempt at bilateral  upper extremity elevation.  With passive elevation, patient drops both arms after 5 seconds.  No unilateral weakness noted. Tone is slightly increased in bilateral lower extremities. Bulk is normal.  No myoclonic jerking or shaking noted throughout exam. Sensation: Intact to light touch bilaterally in all four extremities.  Coordination: Does not perform DTRs: Absent bilateral patellae  Gait: Deferred as patient is wheelchair-bound at baseline  Labs I have reviewed labs in epic and the results pertinent to this consultation are: CBC    Component Value Date/Time   WBC 6.2 10/25/2022 0011   RBC 3.22 (L) 10/25/2022 0011   HGB 9.1 (L) 10/25/2022 0011   HCT 29.3 (L) 10/25/2022 0011   PLT 93 (L) 10/25/2022 0628   MCV 91.0 10/25/2022 0011   MCH 28.3 10/25/2022 0011   MCHC 31.1 10/25/2022 0011   RDW 18.3 (H) 10/25/2022 0011   LYMPHSABS 0.5 (L) 10/24/2022 0404   MONOABS 0.3 10/24/2022 0404   EOSABS 0.5 10/24/2022 0404   BASOSABS 0.0 10/24/2022 0404   CMP     Component Value Date/Time   NA 134 (L) 10/25/2022 0011   K 3.6 10/25/2022 0011   CL 97 (L) 10/25/2022 0011   CO2 25 10/25/2022 0011   GLUCOSE 95 10/25/2022 0011   BUN 14 10/25/2022 0011   CREATININE 2.49 (H) 10/25/2022 0011   CALCIUM 7.6 (L) 10/25/2022 0011   PROT 5.1 (L) 10/24/2022 0404   ALBUMIN 2.8 (L) 10/25/2022 0011   AST 7 (L) 10/24/2022 0404   ALT 5 10/24/2022 0404   ALKPHOS 96 10/24/2022 0404   BILITOT 0.8 10/24/2022 0404   GFRNONAA 20 (L) 10/25/2022 0011   GFRAA  9 (L) 07/03/2020 1218   Lipid Panel  No results found for: "CHOL", "TRIG", "HDL", "CHOLHDL", "VLDL", "LDLCALC", "LDLDIRECT" Lab Results  Component Value Date   HGBA1C 6.9 (H) 07/22/2022   Imaging I have reviewed the images obtained:  CT-scan of the brain 10/24/2022:  Stable and normal for age noncontrast CT appearance of the brain  Assessment: 77 year old wheelchair-bound female with PMHx of HTN, HLD, DM2, ESRD, chronic respiratory failure on home  oxygen, PAD, remote DVT, parkinsonism, RLS, and myoclonus on home Requip, Sinamet, and gabapentin who presented to the ED on 10/24/2022 with complaints of 4 days of worsening baseline shaking/jerking in addition to ongoing persistent nausea, weight loss, anemia, with multiple evaluations associated with recent diagnosis of diverticulitis over the past month.  Early this morning, 10/25/2022, the bedside RN noted a brief episode of shaking and expressed concern for possible seizure-like activity.  On EDP evaluation at the time of concern, patient was noted to be awake, alert, confused but without further shaking events.  Patient states "I just jerked all night" but does not provide further information regarding characteristics of jerking or if the quality is atypical from her baseline complaint.  Prior to EGD this morning, the GI provider witnessed patient seize described as the patients eyes deviated upwards and towards the right with neck posturing and rhythmic arm twitching for approximately 2.5 minutes. During this event the patient was reported to be cyanotic. 2.5 mg IV Versed was administered with resolution of seizure activity. No previous reported history of seizure is noted in chart review. Acute illness and infection may lower the seizure threshold in addition to patient with nausea, vomiting, diarrhea, and poor intake,  PO medication absorption (gabapentin) may be impaired. Will obtain MRI brain and EEG for further seizure evaluation. I suspect patient's increase in baseline jerking movements are due to her ongoing illness and impaired medication adsorption.   Impression: New onset seizure Worsening of baseline RLS and myoclonic jerking in the setting of acute illness  Recommendations: - Routine EEG - Continue home Requip, Sinamet, gabapentin. (Requip previously requested to be held by patient but may cause withdrawal. If she wishes to stop, medication will need to be tapered). - MRI brain without  contrast due to ESRD patient - Keppra 1,000 mg load, then 500 mg BID. Adjust as needed for renal function: CrCl 16.9  CrCl 80 to 130 mL/minute/1.73 m2: 500 mg to 1.5 g every 12 hours.  CrCl 50 to <80 mL/minute/1.73 m2: 500 mg to 1 g every 12 hours.  CrCl 30 to <50 mL/minute/1.73 m2: 250 to 750 mg every 12 hours.  CrCl 15 to <30 mL/minute/1.73 m2: 250 to 500 mg every 12 hours.  CrCl <15 mL/minute/1.73 m2: 250 to 500 mg every 24 hours (expert opinion). - Seizure precautions - 2 mg IV Ativan STAT for seizure activity lasting > 3 minutes and notify neurology - Management of GI illness and co-morbid conditions per primary team/GI  Pt seen by NP/Neuro and later by MD. Note/plan to be edited by MD as needed.  Anibal Henderson, AGAC-NP [facility] Pager: [phone]   Attending Neurohospitalist Addendum Patient seen and examined with APP/Resident. Agree with the history and physical as documented above. Agree with the plan as documented, which I helped formulate. I have edited the note above to reflect my full findings and recommendations. I have independently reviewed the chart, obtained history, review of systems and examined the patient.I have personally reviewed pertinent head/neck/spine imaging (CT/MRI). Please feel free to call  with any questions.  -- Su Monks, MD [facility] [phone]  If 7pm- 7am, please page neurology on call as listed in [place].

## 2022-10-25 NOTE — Procedures (Signed)
Patient Name: FALISA LAMORA  MRN: [id number]  Epilepsy Attending: Lora Havens  Referring Physician/Provider: Ripley Fraise, MD  Date: 10/25/2022 Duration: 24.06 mins  Patient history: 77 year old female with seizure-like episodes.  EEG to evaluate for seizure.  Level of alertness: Awake, asleep  AEDs during EEG study: LEV, versed  Technical aspects: This EEG study was done with scalp electrodes positioned according to the 10-20 International system of electrode placement. Electrical activity was reviewed with band pass filter of 1-'70Hz'$ , sensitivity of 7 uV/mm, display speed of 46m/sec with a '60Hz'$  notched filter applied as appropriate. EEG data were recorded continuously and digitally stored.  Video monitoring was available and reviewed as appropriate.  Description: During awake state, no clear posterior dominant rhythm was seen.  EEG showed continuous generalized predominantly 5 to 7 Hz theta slowing admixed with 2 to 3 Hz generalized delta slowing.  Sleep was characterized by vertex waves, sleep spindles (12 to 14 Hz), maximal frontocentral region. Hyperventilation and photic stimulation were not performed.     ABNORMALITY - Continuous slow, generalized  IMPRESSION: This study is suggestive of moderate diffuse encephalopathy, nonspecific etiology.  No seizures or epileptiform discharges were seen throughout the recording.  Annessa Satre [name]

## 2022-10-25 NOTE — ED Notes (Addendum)
Patient noted to have a witnessed seizure activity by RN lasting about 30 secs. EDP arrived at bedside and aware.

## 2022-10-25 NOTE — ED Notes (Signed)
ED TO INPATIENT HANDOFF REPORT  ED Nurse Name and Phone #: Vanetta Shawl [phone]  S Name/Age/Gender Jean Mcclain 77 y.o. female Room/Bed: 039C/039C  Code Status   Code Status: DNR  Home/SNF/Other Skilled nursing facility Patient oriented to: self and place Is this baseline? No   Triage Complete: Triage complete  Chief Complaint ESRD (end stage renal disease) on dialysis ([place]) [N18.6, Z99.2] Symptomatic anemia [D64.9] Abnormal movements [R25.9] Anemia [D64.9]  Triage Note Pt was sent over from [facility]. Staff reports pt has been jerking or shaking. Pt reports she has Parkinson and they jerking started Thursday.     Allergies Allergies  Allergen Reactions   Ms Contin [Morphine] Nausea Only    Level of Care/Admitting Diagnosis ED Disposition     ED Disposition  Admit   Condition  --   [place]: [place] [[id number]]  Level of Care: Progressive [102]  Admit to Progressive based on following criteria: NEUROLOGICAL AND NEUROSURGICAL complex patients with significant risk of instability, who do not meet ICU criteria, yet require close observation or frequent assessment (< / = every 2 - 4 hours) with medical / nursing intervention.  May admit patient to [facility] or [facility] if equivalent level of care is available:: No  Covid Evaluation: Asymptomatic - no recent exposure (last 10 days) testing not required  Diagnosis: Anemia [778242]  Admitting Physician: Octavio Graves  Attending Physician: Thurnell Lose [[id number]]  Certification:: I certify there are rare and unusual circumstances requiring inpatient admission          B Medical/Surgery History Past Medical History:  Diagnosis Date   Acute and chronic respiratory failure (acute-on-chronic) ([place])    Acute deep vein thrombosis (DVT) of popliteal vein of left lower extremity ([place]) 03/11/2017   Anxiety    Arthritis    osteo   Diabetes mellitus, type  2 ([place])    Type II   DVT (deep venous thrombosis) ([place]) 03/2017   left popliteal   ESRD on hemodialysis ([place])    [place]acet joint disease    foot   Fibula fracture 02/2017   left   GERD (gastroesophageal reflux disease)    H/O blood clots    Hyperlipidemia    Hypertension    Macrocytic anemia    Morbid obesity (H[facility])    OSA (obstructive sleep apnea)    does not use CPAP    Osteopenia    Renal disorder    MWF dialysis   Supplemental oxygen dependent    Venous stasis ulcers ([place])    Vitamin D deficiency    Past Surgical History:  Procedure Laterality Date   ANKLE FUSION Left 06/24/2020   Procedure: LEFT TIBIOCALCANEAL FUSION;  Surgeon: [name], MD;  Location: [place];  Service: Orthopedics;  Laterality: Left;   AV FISTULA PLACEMENT Right 08/20/2018   Procedure: ARTERIOVENOUS (AV) FISTULA CREATION BRACHIOCEPHALIC;  Surgeon: Angelia Mould, MD;  Location: [facility];  Service: Vascular;  Laterality: Right;   BREAST SURGERY Right 03/1997   biospy   CARPAL TUNNEL RELEASE Left 2016   HYSTEROSCOPY WITH D & C N/A 09/29/2020   Procedure: DILATATION AND CURETTAGE /HYSTEROSCOPY;  Surgeon: Mora Bellman, MD;  Location: [facility];  Service: Gynecology;  Laterality: N/A;   INSERTION OF MESH N/A 01/08/2013   Procedure: INSERTION OF MESH;  Surgeon: Earnstine Regal, MD;  Location: [facility];  Service: General;  Laterality: N/A;   IR THORACENTESIS ASP PLEURAL SPACE W/IMG  GUIDE  04/06/2017   JOINT REPLACEMENT Right 1999   TOTAL KNEE ARTHROPLASTY Left 1999   Left   TRIGGER FINGER RELEASE Left 11/2014   TRIGGER FINGER RELEASE Right    VENTRAL HERNIA REPAIR N/A 01/08/2013   Procedure: LAPAROSCOPIC VENTRAL HERNIA;  Surgeon: Earnstine Regal, MD;  Location[facility];  Service: General;  Laterality: N/A;     A IV Location/Drains/Wounds Patient Lines/Drains/Airways Status     Active Line/Drains/Airways     Name Placement date Placement time Site Days   Peripheral IV 10/24/22 22 G Anterior;Left Forearm  10/24/22  0741  Forearm  1   Fistula / Graft Right Upper arm --  --  Upper arm  --            Intake/Output Last 24 hours  Intake/Output Summary (Last 24 hours) at 10/25/2022 1245 Last data filed at 10/24/2022 2215 Gross per 24 hour  Intake 360 ml  Output 2000 ml  Net -1640 ml    Labs/Imaging Results for orders placed or performed during the hospital encounter of 10/24/22 (from the past 48 hour(s))  Comprehensive metabolic panel     Status: Abnormal   Collection Time: 10/24/22  4:04 AM  Result Value Ref Range   Sodium 135 135 - 145 mmol/L   Potassium 3.8 3.5 - 5.1 mmol/L   Chloride 96 (L) 98 - 111 mmol/L   CO2 27 22 - 32 mmol/L   Glucose, Bld 82 70 - 99 mg/dL    Comment: Glucose reference range applies only to samples taken after fasting for at least 8 hours.   BUN 34 (H) 8 - 23 mg/dL   Creatinine, Ser 4.74 (H) 0.44 - 1.00 mg/dL   Calcium 7.8 (L) 8.9 - 10.3 mg/dL   Total Protein 5.1 (L) 6.5 - 8.1 g/dL   Albumin 2.7 (L) 3.5 - 5.0 g/dL   AST 7 (L) 15 - 41 U/L   ALT 5 0 - 44 U/L   Alkaline Phosphatase 96 38 - 126 U/L   Total Bilirubin 0.8 0.3 - 1.2 mg/dL   GFR, Estimated 9 (L) >60 mL/min    Comment: (NOTE) Calculated using the CKD-EPI Creatinine Equation (2021)    Anion gap 12 5 - 15    Comment: Performed at [facility], [place] [address], [place], [place] [address]  CBC with Differential     Status: Abnormal   Collection Time: 10/24/22  4:04 AM  Result Value Ref Range   WBC 6.7 4.0 - 10.5 K/uL   RBC 2.68 (L) 3.87 - 5.11 MIL/uL   Hemoglobin 7.3 (L) 12.0 - 15.0 g/dL   HCT 24.7 (L) 36.0 - 46.0 %   MCV 92.2 80.0 - 100.0 fL   MCH 27.2 26.0 - 34.0 pg   MCHC 29.6 (L) 30.0 - 36.0 g/dL   RDW 19.7 (H) 11.5 - 15.5 %   Platelets 118 (L) 150 - 400 K/uL    Comment: REPEATED TO VERIFY   nRBC 0.0 0.0 - 0.2 %   Neutrophils Relative % 79 %   Neutro Abs 5.3 1.7 - 7.7 K/uL   Lymphocytes Relative 8 %   Lymphs Abs 0.5 (L) 0.7 - 4.0 K/uL   Monocytes Relative 5 %   Monocytes  Absolute 0.3 0.1 - 1.0 K/uL   Eosinophils Relative 8 %   Eosinophils Absolute 0.5 0.0 - 0.5 K/uL   Basophils Relative 0 %   Basophils Absolute 0.0 0.0 - 0.1 K/uL   Immature Granulocytes 0 %  Abs Immature Granulocytes 0.03 0.00 - 0.07 K/uL    Comment: Performed at [facility], [place] [address], [place], [place] [address]  Magnesium     Status: None   Collection Time: 10/24/22  4:04 AM  Result Value Ref Range   Magnesium 1.7 1.7 - 2.4 mg/dL    Comment: Performed at [facility] [address] [place], [place] [address]  Phosphorus     Status: None   Collection Time: 10/24/22  4:04 AM  Result Value Ref Range   Phosphorus 3.8 2.5 - 4.6 mg/dL    Comment: Performed at [facility] [address] [place], [place] [address]  Type and screen [place]     Status: None   Collection Time: 10/24/22  7:36 AM  Result Value Ref Range   ABO/RH(D) O POS    Antibody Screen NEG    Sample Expiration 10/27/2022,2359    Unit Number [id number]    Blood Component Type RED CELLS,LR    Unit division 00    Status of Unit ISSUED,FINAL    Transfusion Status OK TO TRANSFUSE    Crossmatch Result      Compatible Performed at [facility], [place] [address], [place], [place] [address]   Prepare RBC (crossmatch)     Status: None   Collection Time: 10/24/22  8:00 AM  Result Value Ref Range   Order Confirmation      ORDER PROCESSED BY BLOOD BANK Performed at [facility], [place] [address] [place], [place] [address]   TSH     Status: None   Collection Time: 10/24/22 10:42 AM  Result Value Ref Range   TSH 2.455 0.350 - 4.500 uIU/mL    Comment: Performed by a 3rd Generation assay with a functional sensitivity of <=0.01 uIU/mL. Performed at [facility], [place] [address] [place], [place] [address]   T4, free     Status: None   Collection Time: 10/24/22 10:42 AM  Result Value Ref Range   Free T4 0.95 0.61 - 1.12 ng/dL    Comment: (NOTE) Biotin  ingestion may interfere with free T4 tests. If the results are inconsistent with the TSH level, previous test results, or the clinical presentation, then consider biotin interference. If needed, order repeat testing after stopping biotin. Performed at [facility], [place] [address], [place], [place] [address]   Renal function panel     Status: Abnormal   Collection Time: 10/24/22 10:42 AM  Result Value Ref Range   Sodium 133 (L) 135 - 145 mmol/L   Potassium 3.8 3.5 - 5.1 mmol/L   Chloride 96 (L) 98 - 111 mmol/L   CO2 26 22 - 32 mmol/L   Glucose, Bld 88 70 - 99 mg/dL    Comment: Glucose reference range applies only to samples taken after fasting for at least 8 hours.   BUN 35 (H) 8 - 23 mg/dL   Creatinine, Ser 4.87 (H) 0.44 - 1.00 mg/dL   Calcium 7.7 (L) 8.9 - 10.3 mg/dL   Phosphorus 3.7 2.5 - 4.6 mg/dL   Albumin 2.7 (L) 3.5 - 5.0 g/dL   GFR, Estimated 9 (L) >60 mL/min    Comment: (NOTE) Calculated using the CKD-EPI Creatinine Equation (2021)    Anion gap 11 5 - 15    Comment: Performed at [facility] [address], [place], [place] [address]  Hepatitis B surface antigen  Status: None   Collection Time: 10/24/22 10:42 AM  Result Value Ref Range   Hepatitis B Surface Ag NON REACTIVE NON REACTIVE    Comment: Performed at [facility] [address], [place], [place] [address]  C-reactive protein     Status: Abnormal   Collection Time: 10/24/22 10:42 AM  Result Value Ref Range   CRP 5.2 (H) <1.0 mg/dL    Comment: Performed at [facility] [address] [place], [place] [address]  CBG monitoring, ED     Status: None   Collection Time: 10/24/22 11:40 AM  Result Value Ref Range   Glucose-Capillary 95 70 - 99 mg/dL    Comment: Glucose reference range applies only to samples taken after fasting for at least 8 hours.  CBG monitoring, ED     Status: Abnormal   Collection Time: 10/24/22  4:42 PM  Result Value Ref Range   Glucose-Capillary 102 (H) 70 -  99 mg/dL    Comment: Glucose reference range applies only to samples taken after fasting for at least 8 hours.  Sedimentation rate     Status: Abnormal   Collection Time: 10/24/22  4:51 PM  Result Value Ref Range   Sed Rate 45 (H) 0 - 22 mm/hr    Comment: Performed at [facility] [address], [place], [place] [address]  Urinalysis, Routine w reflex microscopic     Status: Abnormal   Collection Time: 10/24/22  5:26 PM  Result Value Ref Range   Color, Urine AMBER (A) YELLOW    Comment: BIOCHEMICALS MAY BE AFFECTED BY COLOR   APPearance TURBID (A) CLEAR   Specific Gravity, Urine 1.018 1.005 - 1.030   pH 6.0 5.0 - 8.0   Glucose, UA NEGATIVE NEGATIVE mg/dL   Hgb urine dipstick SMALL (A) NEGATIVE   Bilirubin Urine NEGATIVE NEGATIVE   Ketones, ur NEGATIVE NEGATIVE mg/dL   Protein, ur 100 (A) NEGATIVE mg/dL   Nitrite NEGATIVE NEGATIVE   Leukocytes,Ua LARGE (A) NEGATIVE   RBC / HPF 0-5 0 - 5 RBC/hpf   WBC, UA >50 (H) 0 - 5 WBC/hpf   Bacteria, UA MANY (A) NONE SEEN   Squamous Epithelial / HPF 0-5 0 - 5 /HPF    Comment: Performed at [facility], [address] [place], [place] [address]  CBG monitoring, ED     Status: None   Collection Time: 10/24/22 11:01 PM  Result Value Ref Range   Glucose-Capillary 89 70 - 99 mg/dL    Comment: Glucose reference range applies only to samples taken after fasting for at least 8 hours.  CBC     Status: Abnormal   Collection Time: 10/25/22 12:11 AM  Result Value Ref Range   WBC 6.2 4.0 - 10.5 K/uL   RBC 3.22 (L) 3.87 - 5.11 MIL/uL   Hemoglobin 9.1 (L) 12.0 - 15.0 g/dL   HCT 29.3 (L) 36.0 - 46.0 %   MCV 91.0 80.0 - 100.0 fL   MCH 28.3 26.0 - 34.0 pg   MCHC 31.1 30.0 - 36.0 g/dL   RDW 18.3 (H) 11.5 - 15.5 %   Platelets 98 (L) 150 - 400 K/uL    Comment: REPEATED TO VERIFY   nRBC 0.0 0.0 - 0.2 %    Comment: Performed at [facility] [address], [place],  [address]  Renal function panel     Status: Abnormal    Collection Time: 10/25/22 12:11 AM  Result Value Ref Range   Sodium 134 (L) 135 - 145 mmol/L   Potassium 3.6 3.5 - 5.1 mmol/L   Chloride 97 (L) 98 - 111 mmol/L   CO2 25 22 - 32 mmol/L   Glucose, Bld 95 70 - 99 mg/dL    Comment: Glucose reference range applies only to samples taken after fasting for at least 8 hours.   BUN 14 8 - 23 mg/dL   Creatinine, Ser 2.49 (H) 0.44 - 1.00 mg/dL   Calcium 7.6 (L) 8.9 - 10.3 mg/dL   Phosphorus 2.3 (L) 2.5 - 4.6 mg/dL   Albumin 2.8 (L) 3.5 - 5.0 g/dL   GFR, Estimated 20 (L) >60 mL/min    Comment: (NOTE) Calculated using the CKD-EPI Creatinine Equation (2021)    Anion gap 12 5 - 15    Comment: Performed at [facility] [address] [place], [place] [address]  DIC Panel ONCE - STAT     Status: Abnormal   Collection Time: 10/25/22  6:28 AM  Result Value Ref Range   Prothrombin Time 14.2 11.4 - 15.2 seconds   INR 1.1 0.8 - 1.2    Comment: (NOTE) INR goal varies based on device and disease states.    aPTT 29 24 - 36 seconds   Fibrinogen 362 210 - 475 mg/dL    Comment: (NOTE) Fibrinogen results may be underestimated in patients receiving thrombolytic therapy.    D-Dimer, Quant 2.17 (H) 0.00 - 0.50 ug/mL-FEU    Comment: (NOTE) At the manufacturer cut-off value of 0.5 g/mL FEU, this assay has a negative predictive value of 95-100%.This assay is intended for use in conjunction with a clinical pretest probability (PTP) assessment model to exclude pulmonary embolism (PE) and deep venous thrombosis (DVT) in outpatients suspected of PE or DVT. Results should be correlated with clinical presentation.    Platelets 93 (L) 150 - 400 K/uL    Comment: REPEATED TO VERIFY   Smear Review NO SCHISTOCYTES SEEN     Comment: Performed at [facility], [place] [address] [place], [place] [address]  Lactate dehydrogenase     Status: None   Collection Time: 10/25/22  6:28 AM  Result Value Ref Range   LDH 177 98 - 192 U/L    Comment: Performed at  [facility], [place] [address], [place], [place] [address]  CBG monitoring, ED     Status: Abnormal   Collection Time: 10/25/22  7:35 AM  Result Value Ref Range   Glucose-Capillary 109 (H) 70 - 99 mg/dL    Comment: Glucose reference range applies only to samples taken after fasting for at least 8 hours.  CBG monitoring, ED     Status: Abnormal   Collection Time: 10/25/22  8:25 AM  Result Value Ref Range   Glucose-Capillary 107 (H) 70 - 99 mg/dL    Comment: Glucose reference range applies only to samples taken after fasting for at least 8 hours.  CBG monitoring, ED     Status: Abnormal   Collection Time: 10/25/22 11:33 AM  Result Value Ref Range   Glucose-Capillary 104 (H) 70 - 99 mg/dL    Comment: Glucose reference range applies only to samples taken after fasting for at least 8 hours.   VAS [place] LOWER EXTREMITY VENOUS (DVT)  Result Date: 10/24/2022  Lower Venous DVT Study Patient Name:  Jean Mcclain  Date of Exam:   10/24/2022 Medical Rec #: [id number]  Accession #:    [id number] Date of Birth: May 02, 1946          Patient Gender: F Patient Age:   36 years Exam Location:  [facility] Procedure:      VAS [place] LOWER EXTREMITY VENOUS (DVT) Referring Phys: Fuller Plan --------------------------------------------------------------------------------  Indications: Swelling.  Comparison Study: Prior negative bilateral LEV done 07/22/22 Performing Technologist: Sharion Dove RVS  Examination Guidelines: A complete evaluation includes B-mode imaging, spectral Doppler, color Doppler, and power Doppler as needed of all accessible portions of each vessel. Bilateral testing is considered an integral part of a complete examination. Limited examinations for reoccurring indications may be performed as noted. The reflux portion of the exam is performed with the patient in reverse Trendelenburg.  +---------+---------------+---------+-----------+----------+-------------------+ RIGHT     CompressibilityPhasicitySpontaneityPropertiesThrombus Aging      +---------+---------------+---------+-----------+----------+-------------------+ CFV      Full           Yes      Yes                                      +---------+---------------+---------+-----------+----------+-------------------+ SFJ      Full                                                             +---------+---------------+---------+-----------+----------+-------------------+ FV Prox  Full                                                             +---------+---------------+---------+-----------+----------+-------------------+ FV Mid   Full                                                             +---------+---------------+---------+-----------+----------+-------------------+ FV DistalFull                                                             +---------+---------------+---------+-----------+----------+-------------------+ PFV      Full                                                             +---------+---------------+---------+-----------+----------+-------------------+ POP      Full           Yes      Yes                                      +---------+---------------+---------+-----------+----------+-------------------+ PTV  Not well visualized +---------+---------------+---------+-----------+----------+-------------------+ PERO                                                  Not well visualized +---------+---------------+---------+-----------+----------+-------------------+   +---------+---------------+---------+-----------+----------+--------------+ LEFT     CompressibilityPhasicitySpontaneityPropertiesThrombus Aging +---------+---------------+---------+-----------+----------+--------------+ CFV      Full           Yes      Yes                                  +---------+---------------+---------+-----------+----------+--------------+ SFJ      Full                                                        +---------+---------------+---------+-----------+----------+--------------+ FV Prox  Full                                                        +---------+---------------+---------+-----------+----------+--------------+ FV Mid   Full                                                        +---------+---------------+---------+-----------+----------+--------------+ FV DistalFull                                                        +---------+---------------+---------+-----------+----------+--------------+ PFV      Full                                                        +---------+---------------+---------+-----------+----------+--------------+ POP      Full           Yes      Yes                                 +---------+---------------+---------+-----------+----------+--------------+ PTV      Full                                                        +---------+---------------+---------+-----------+----------+--------------+ PERO     Full                                                        +---------+---------------+---------+-----------+----------+--------------+  Summary: BILATERAL: - No evidence of deep vein thrombosis seen in the lower extremities, bilaterally. - RIGHT: - No cystic structure found in the popliteal fossa.  LEFT: - No cystic structure found in the popliteal fossa.  *See table(s) above for measurements and observations. Electronically signed by Orlie Pollen on 10/24/2022 at 6:47:57 PM.    Final    CT Head Wo Contrast  Result Date: 10/24/2022 CLINICAL DATA:  77 year old female with jerking, shaking. EXAM: CT HEAD WITHOUT CONTRAST TECHNIQUE: Contiguous axial images were obtained from the base of the skull through the vertex without intravenous contrast. RADIATION DOSE REDUCTION: This  exam was performed according to the departmental dose-optimization program which includes automated exposure control, adjustment of the mA and/or kV according to patient size and/or use of iterative reconstruction technique. COMPARISON:  Brain MRI 07/21/2022.  Head CT 07/03/2020. FINDINGS: Brain: Cerebral volume is not significantly changed since 2021, within normal limits for age. 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 No midline shift, ventriculomegaly, mass effect, evidence of mass lesion, intracranial hemorrhage or evidence of cortically based acute infarction. Gray-white matter differentiation is stable and within normal limits for age. Vascular: Calcified atherosclerosis at the skull base. No suspicious intracranial vascular hyperdensity. Skull: No acute osseous abnormality identified. Sinuses/Orbits: Visualized paranasal sinuses and mastoids are stable and well aerated. Other: No acute orbit or scalp soft tissue finding. IMPRESSION: Stable and normal for age non contrast CT appearance of the brain. Electronically Signed   By: Genevie Ann M.D.   On: 10/24/2022 05:18   DG Chest Port 1 View  Result Date: 10/24/2022 CLINICAL DATA:  76 year old female missed dialysis. EXAM: PORTABLE CHEST 1 VIEW COMPARISON:  Portable chest 10/19/2022 and earlier. FINDINGS: Portable AP semi upright view at 0400 hours. Stable lung volumes. Stable cardiac size and mediastinal contours. On going left lung base hypo ventilation which appears in part related to small pleural effusion. Increased right lung and lung base veiling opacity and blunting of the right costophrenic angle. No superimposed pneumothorax, pulmonary edema, or consolidation. Paucity of bowel gas in the visible abdomen. Stable visualized osseous structures. Both glenohumeral joints are chronically dislocated since at least 07/21/2022. IMPRESSION: 1. Small bilateral pleural effusions, increased on the right since  10/19/2022. 2. No other acute cardiopulmonary abnormality. 3. Chronic bilateral glenohumeral joint dislocations. Electronically Signed   By: Genevie Ann M.D.   On: 10/24/2022 04:31    Pending Labs Unresulted Labs (From admission, onward)     Start     Ordered   10/26/22 0500  CBC with Differential/Platelet  Daily,   R     Question:  Specimen collection method  Answer:  Lab=Lab collect   10/25/22 0555   10/26/22 0500  Brain natriuretic peptide  Daily,   R     Question:  Specimen collection method  Answer:  Lab=Lab collect   10/25/22 0555   10/26/22 0500  Magnesium  Daily,   R     Question:  Specimen collection method  Answer:  Lab=Lab collect   10/25/22 0555   10/25/22 0500  Renal function panel  Daily,   R      10/24/22 0739   10/24/22 1800  Hepatitis B surface antibody,quantitative  Once,   AD        10/24/22 1800   10/24/22 0913  Gastrointestinal Panel by PCR , Stool  (Gastrointestinal Panel by PCR, Stool                                                                                                                                                     **  Does Not include CLOSTRIDIUM DIFFICILE testing. **If CDIFF testing is needed, place order from the "C Difficile Testing" order set.**)  Once,   R        10/24/22 0912   10/24/22 0912  C Difficile Quick Screen w PCR reflex  (C Difficile quick screen w PCR reflex panel )  Once, for 24 hours,   TIMED       References:    CDiff Information Tool   10/24/22 0912   10/24/22 0748  Occult blood card to lab, stool  Once,   R        10/24/22 0747            Vitals/Pain Today's Vitals   10/25/22 1100 10/25/22 1115 10/25/22 1130 10/25/22 1142  BP: (!) 151/58 (!) 131/56 (!) 126/56   Pulse: (!) 56 (!) 59 (!) 56   Resp: '17 18 18   '$ Temp:    98.3 F (36.8 C)  TempSrc:    Axillary  SpO2: 100% 100% 100%   PainSc:        Isolation Precautions Enteric precautions (UV disinfection)  Medications Medications  0.9 %  sodium chloride infusion  (Manually program via Guardrails IV Fluids) ( Intravenous MAR Unhold 10/25/22 1045)  sodium chloride flush (NS) 0.9 % injection 3 mL ( Intravenous MAR Unhold 10/25/22 1045)  acetaminophen (TYLENOL) tablet 650 mg ( Oral MAR Unhold 10/25/22 1045)    Or  acetaminophen (TYLENOL) suppository 650 mg ( Rectal MAR Unhold 10/25/22 1045)  albuterol (PROVENTIL) (2.5 MG/3ML) 0.083% nebulizer solution 2.5 mg ( Nebulization MAR Unhold 10/25/22 1045)  ondansetron (ZOFRAN) tablet 4 mg ( Oral MAR Unhold 10/25/22 1045)    Or  ondansetron (ZOFRAN) injection 4 mg ( Intravenous MAR Unhold 10/25/22 1045)  pantoprazole (PROTONIX) EC tablet 40 mg ( Oral MAR Unhold 10/25/22 1045)  insulin aspart (novoLOG) injection 0-5 Units ( Subcutaneous MAR Unhold 10/25/22 1045)  insulin aspart (novoLOG) injection 0-6 Units ( Subcutaneous Not Given 10/25/22 1135)  dextrose 50 % solution 50 mL ( Intravenous MAR Unhold 10/25/22 1045)  Chlorhexidine Gluconate Cloth 2 % PADS 6 each ( Topical MAR Unhold 10/25/22 1045)  pentafluoroprop-tetrafluoroeth (GEBAUERS) aerosol 1 Application ( Topical MAR Unhold 10/25/22 1045)  lidocaine (PF) (XYLOCAINE) 1 % injection 5 mL ( Intradermal MAR Unhold 10/25/22 1045)  lidocaine-prilocaine (EMLA) cream 1 Application ( Topical MAR Unhold 10/25/22 1045)  heparin injection 1,000 Units ( Intracatheter MAR Unhold 10/25/22 1045)  anticoagulant sodium citrate solution 5 mL ( Intracatheter MAR Unhold 10/25/22 1045)  alteplase (CATHFLO ACTIVASE) injection 2 mg ( Intracatheter MAR Unhold 10/25/22 1045)  calcitRIOL (ROCALTROL) capsule 1.25 mcg ( Oral MAR Unhold 10/25/22 1045)  heparin injection 1,000 Units ( Dialysis MAR Unhold 10/25/22 1045)  buprenorphine (BUTRANS) 10 MCG/HR 1 patch ( Transdermal MAR Unhold 10/25/22 1045)  atorvastatin (LIPITOR) tablet 20 mg ( Oral MAR Unhold 10/25/22 1045)  hydrOXYzine (ATARAX) tablet 50 mg ( Oral MAR Unhold 10/25/22 1045)  mirtazapine (REMERON) tablet 15 mg ( Oral MAR Unhold 10/25/22 1045)   simethicone (MYLICON) chewable tablet 120 mg ( Oral MAR Unhold 10/25/22 1045)  tamsulosin (FLOMAX) capsule 0.4 mg ( Oral MAR Unhold 10/25/22 1045)  melatonin tablet 5 mg ( Oral MAR Unhold 10/25/22 1045)  gabapentin (NEURONTIN) capsule 300 mg ( Oral MAR Unhold 10/25/22 1045)  fluticasone (FLONASE) 50 MCG/ACT nasal spray 1 spray ( Each Nare MAR Unhold 10/25/22 1045)  rOPINIRole (REQUIP XL) 24 hr tablet 12 mg ( Oral MAR Unhold 10/25/22 1045)  nystatin (MYCOSTATIN/NYSTOP) topical  powder 1 Application ( Topical MAR Unhold 10/25/22 1045)  camphor-menthol (SARNA) lotion 1 Application ( Topical MAR Unhold 10/25/22 1045)  carbidopa-levodopa (SINEMET IR) 25-100 MG per tablet immediate release 2 tablet ( Oral MAR Unhold 10/25/22 1045)    And  carbidopa-levodopa (SINEMET IR) 25-100 MG per tablet immediate release 1 tablet (1 tablet Oral Not Given 10/25/22 1156)  midodrine (PROAMATINE) tablet 20 mg ( Oral MAR Unhold 10/25/22 1045)  heparin sodium (porcine) injection 3,000 Units ( Intravenous MAR Unhold 10/25/22 1045)  pentafluoroprop-tetrafluoroeth (GEBAUERS) aerosol (  Not Given 10/24/22 2253)  promethazine (PHENERGAN) 12.5 mg in sodium chloride 0.9 % 50 mL IVPB ( Intravenous MAR Unhold 10/25/22 1045)  hydrALAZINE (APRESOLINE) injection 10 mg ( Intravenous MAR Unhold 10/25/22 1045)  LORazepam (ATIVAN) injection 2 mg (has no administration in time range)  lactated ringers infusion ( Intravenous New Bag/Given 10/25/22 1123)  levETIRAcetam (KEPPRA) IVPB 500 mg/100 mL premix (has no administration in time range)  heparin sodium (porcine) injection 2,500 Units (2,500 Units Intravenous Given 10/24/22 2000)  levETIRAcetam (KEPPRA) IVPB 1000 mg/100 mL premix (0 mg Intravenous Stopped 10/25/22 1119)    Mobility non-ambulatory Low fall risk   Focused Assessments Neuro Assessment Handoff:  Swallow screen pass? No  Cardiac Rhythm: Sinus bradycardia       Neuro Assessment: Within Defined Limits Neuro Checks:      Has  TPA been given? No If patient is a Neuro Trauma and patient is going to OR before floor call report to [place] nurse: [phone] or [phone]   R Recommendations: See Admitting Provider Note  Report given to:   Additional Notes:

## 2022-10-25 NOTE — Progress Notes (Signed)
Progress Note   Subjective  Patient had seizure like episode last night which was short lived and resolved. She continues to feel nauseated and wretching. Had HD last night. CT head done which did not show any problems. Came down for EGD this AM. She appeared in her normal state with nausea. I actually consented her for EGD and while in the preprocedure room, however prior to her exam starting she had another episode of seizure like activity recur, treated with versed. Procedure cancelled.    Objective   Vital signs in last 24 hours: Temp:  [97.5 F (36.4 C)-98.7 F (37.1 C)] 97.5 F (36.4 C) (01/16 0950) Pulse Rate:  [32-117] 58 (01/16 0700) Resp:  [10-20] 19 (01/16 0950) BP: (105-153)/(46-93) 132/71 (01/16 0950) SpO2:  [82 %-100 %] 98 % (01/16 0950)   General:    white female in NAD Psych:  Cooperative. Normal mood and affect.  Intake/Output from previous day: 01/15 0701 - 01/16 0700 In: 360 [Blood:360] Out: 2000  Intake/Output this shift: No intake/output data recorded.  Lab Results: Recent Labs    10/24/22 0404 10/25/22 0011 10/25/22 0628  WBC 6.7 6.2  --   HGB 7.3* 9.1*  --   HCT 24.7* 29.3*  --   PLT 118* 98* 93*   BMET Recent Labs    10/24/22 0404 10/24/22 1042 10/25/22 0011  NA 135 133* 134*  K 3.8 3.8 3.6  CL 96* 96* 97*  CO2 '27 26 25  '$ GLUCOSE 82 88 95  BUN 34* 35* 14  CREATININE 4.74* 4.87* 2.49*  CALCIUM 7.8* 7.7* 7.6*   LFT Recent Labs    10/24/22 0404 10/24/22 1042 10/25/22 0011  PROT 5.1*  --   --   ALBUMIN 2.7*   < > 2.8*  AST 7*  --   --   ALT 5  --   --   ALKPHOS 96  --   --   BILITOT 0.8  --   --    < > = values in this interval not displayed.   PT/INR Recent Labs    10/25/22 0628  LABPROT 14.2  INR 1.1    Studies/Results: VAS [place] LOWER EXTREMITY VENOUS (DVT)  Result Date: 10/24/2022  Lower Venous DVT Study Patient Name:  Jean Mcclain  Date of Exam:   10/24/2022 Medical Rec #: [id number]         Accession #:     [id number] Date of Birth: 1946/08/12          Patient Gender: F Patient Age:   77 years Exam Location:  [facility] Procedure:      VAS [place] LOWER EXTREMITY VENOUS (DVT) Referring Phys: Fuller Plan --------------------------------------------------------------------------------  Indications: Swelling.  Comparison Study: Prior negative bilateral LEV done 07/22/22 Performing Technologist: Sharion Dove RVS  Examination Guidelines: A complete evaluation includes B-mode imaging, spectral Doppler, color Doppler, and power Doppler as needed of all accessible portions of each vessel. Bilateral testing is considered an integral part of a complete examination. Limited examinations for reoccurring indications may be performed as noted. The reflux portion of the exam is performed with the patient in reverse Trendelenburg.  +---------+---------------+---------+-----------+----------+-------------------+ RIGHT    CompressibilityPhasicitySpontaneityPropertiesThrombus Aging      +---------+---------------+---------+-----------+----------+-------------------+ CFV      Full           Yes      Yes                                      +---------+---------------+---------+-----------+----------+-------------------+  SFJ      Full                                                             +---------+---------------+---------+-----------+----------+-------------------+ FV Prox  Full                                                             +---------+---------------+---------+-----------+----------+-------------------+ FV Mid   Full                                                             +---------+---------------+---------+-----------+----------+-------------------+ FV DistalFull                                                             +---------+---------------+---------+-----------+----------+-------------------+ PFV      Full                                                              +---------+---------------+---------+-----------+----------+-------------------+ POP      Full           Yes      Yes                                      +---------+---------------+---------+-----------+----------+-------------------+ PTV                                                   Not well visualized +---------+---------------+---------+-----------+----------+-------------------+ PERO                                                  Not well visualized +---------+---------------+---------+-----------+----------+-------------------+   +---------+---------------+---------+-----------+----------+--------------+ LEFT     CompressibilityPhasicitySpontaneityPropertiesThrombus Aging +---------+---------------+---------+-----------+----------+--------------+ CFV      Full           Yes      Yes                                 +---------+---------------+---------+-----------+----------+--------------+ SFJ      Full                                                        +---------+---------------+---------+-----------+----------+--------------+  FV Prox  Full                                                        +---------+---------------+---------+-----------+----------+--------------+ FV Mid   Full                                                        +---------+---------------+---------+-----------+----------+--------------+ FV DistalFull                                                        +---------+---------------+---------+-----------+----------+--------------+ PFV      Full                                                        +---------+---------------+---------+-----------+----------+--------------+ POP      Full           Yes      Yes                                 +---------+---------------+---------+-----------+----------+--------------+ PTV      Full                                                         +---------+---------------+---------+-----------+----------+--------------+ PERO     Full                                                        +---------+---------------+---------+-----------+----------+--------------+     Summary: BILATERAL: - No evidence of deep vein thrombosis seen in the lower extremities, bilaterally. - RIGHT: - No cystic structure found in the popliteal fossa.  LEFT: - No cystic structure found in the popliteal fossa.  *See table(s) above for measurements and observations. Electronically signed by Orlie Pollen on 10/24/2022 at 6:47:57 PM.    Final    CT Head Wo Contrast  Result Date: 10/24/2022 CLINICAL DATA:  77 year old female with jerking, shaking. EXAM: CT HEAD WITHOUT CONTRAST TECHNIQUE: Contiguous axial images were obtained from the base of the skull through the vertex without intravenous contrast. RADIATION DOSE REDUCTION: This exam was performed according to the departmental dose-optimization program which includes automated exposure control, adjustment of the mA and/or kV according to patient size and/or use of iterative reconstruction technique. COMPARISON:  Brain MRI 07/21/2022.  Head CT 07/03/2020. FINDINGS: Brain: Cerebral volume is not significantly changed since 2021, within normal limits for age. 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  No midline shift, ventriculomegaly, mass effect, evidence of mass lesion, intracranial hemorrhage or evidence of cortically based acute infarction. Gray-white matter differentiation is stable and within normal limits for age. Vascular: Calcified atherosclerosis at the skull base. No suspicious intracranial vascular hyperdensity. Skull: No acute osseous abnormality identified. Sinuses/Orbits: Visualized paranasal sinuses and mastoids are stable and well aerated. Other: No acute orbit or scalp soft tissue finding. IMPRESSION: Stable and normal for age non contrast CT appearance of  the brain. Electronically Signed   By: Genevie Ann M.D.   On: 10/24/2022 05:18   DG Chest Port 1 View  Result Date: 10/24/2022 CLINICAL DATA:  77 year old female missed dialysis. EXAM: PORTABLE CHEST 1 VIEW COMPARISON:  Portable chest 10/19/2022 and earlier. FINDINGS: Portable AP semi upright view at 0400 hours. Stable lung volumes. Stable cardiac size and mediastinal contours. On going left lung base hypo ventilation which appears in part related to small pleural effusion. Increased right lung and lung base veiling opacity and blunting of the right costophrenic angle. No superimposed pneumothorax, pulmonary edema, or consolidation. Paucity of bowel gas in the visible abdomen. Stable visualized osseous structures. Both glenohumeral joints are chronically dislocated since at least 07/21/2022. IMPRESSION: 1. Small bilateral pleural effusions, increased on the right since 10/19/2022. 2. No other acute cardiopulmonary abnormality. 3. Chronic bilateral glenohumeral joint dislocations. Electronically Signed   By: Genevie Ann M.D.   On: 10/24/2022 04:31       Assessment / Plan:    77 y/o female with multiple medical problems, end-stage renal disease on HD, recurrent diverticulitis, Parkinson's, history of DVT on Eliquis, admitted with the following:  Refractory nausea / vomiting Abnormal CT scan - persistent diverticulitis of left colon Seizure like activity  She needs a colonoscopy or flex sig to evaluate her left colon and clarify CT changes, rule out mass lesion. However do not think she will do very well with a bowel prep given the persistent nausea / vomiting.  We had scheduled her for an EGD this morning to clear her upper tract.  She had some seizure-like activity at her [facility] which led her to the hospital initially in recent days.  CT head looked okay.  She had some seizure-like activity overnight which resolved.  She felt at her baseline this morning.  I had actually consented her for EGD and then she had  recurrent seizure-like activity just prior to her exam, treated with Versed.  Her EGD is canceled this morning and she needs her neurology evaluation completed prior to proceeding with anesthesia at this point in time.  Perhaps her nausea is more CNS related in light of her recurrent seizure-like activity.  When she is cleared for anesthesia we will proceed with EGD again.  If she can tolerate a bowel prep would ideally like to do colonoscopy.  If she cannot tolerate bowel prep due to her nausea, we may have to do a flex sig with just enemas however I am concerned this may not be enough to clear that part of her colon.  For now we will await her neurologic evaluation and proceed when she is cleared from their perspective.  Awaiting EEG and neurology consult at this time.  I spoke with Dr. Candiss Norse about events this morning.  PLAN: - EGD cancelled - await Neurology evaluation, EEG - once cleared by Neurology will then plan for endoscopic evaluation - EGD +/- colonoscopy if she can tolerate prep, if not, then flex sig with enemas  Will follow, call with questions in  the interim.  Jolly Mango, MD [facility]

## 2022-10-26 ENCOUNTER — Inpatient Hospital Stay (HOSPITAL_COMMUNITY): Payer: PPO

## 2022-10-26 DIAGNOSIS — D649 Anemia, unspecified: Secondary | ICD-10-CM | POA: Diagnosis not present

## 2022-10-26 DIAGNOSIS — R569 Unspecified convulsions: Secondary | ICD-10-CM | POA: Diagnosis not present

## 2022-10-26 LAB — CBC WITH DIFFERENTIAL/PLATELET
Abs Immature Granulocytes: 0.02 10*3/uL (ref 0.00–0.07)
Basophils Absolute: 0 10*3/uL (ref 0.0–0.1)
Basophils Relative: 0 %
Eosinophils Absolute: 0.2 10*3/uL (ref 0.0–0.5)
Eosinophils Relative: 6 %
HCT: 27 % — ABNORMAL LOW (ref 36.0–46.0)
Hemoglobin: 8.4 g/dL — ABNORMAL LOW (ref 12.0–15.0)
Immature Granulocytes: 1 %
Lymphocytes Relative: 11 %
Lymphs Abs: 0.5 10*3/uL — ABNORMAL LOW (ref 0.7–4.0)
MCH: 28.3 pg (ref 26.0–34.0)
MCHC: 31.1 g/dL (ref 30.0–36.0)
MCV: 90.9 fL (ref 80.0–100.0)
Monocytes Absolute: 0.3 10*3/uL (ref 0.1–1.0)
Monocytes Relative: 6 %
Neutro Abs: 3.4 10*3/uL (ref 1.7–7.7)
Neutrophils Relative %: 76 %
Platelets: 83 10*3/uL — ABNORMAL LOW (ref 150–400)
RBC: 2.97 MIL/uL — ABNORMAL LOW (ref 3.87–5.11)
RDW: 18.3 % — ABNORMAL HIGH (ref 11.5–15.5)
WBC: 4.4 10*3/uL (ref 4.0–10.5)
nRBC: 0 % (ref 0.0–0.2)

## 2022-10-26 LAB — RENAL FUNCTION PANEL
Albumin: 2.4 g/dL — ABNORMAL LOW (ref 3.5–5.0)
Anion gap: 8 (ref 5–15)
BUN: 24 mg/dL — ABNORMAL HIGH (ref 8–23)
CO2: 26 mmol/L (ref 22–32)
Calcium: 8 mg/dL — ABNORMAL LOW (ref 8.9–10.3)
Chloride: 99 mmol/L (ref 98–111)
Creatinine, Ser: 3.49 mg/dL — ABNORMAL HIGH (ref 0.44–1.00)
GFR, Estimated: 13 mL/min — ABNORMAL LOW (ref >60)
Glucose, Bld: 90 mg/dL (ref 70–99)
Phosphorus: 3.2 mg/dL (ref 2.5–4.6)
Potassium: 3.9 mmol/L (ref 3.5–5.1)
Sodium: 133 mmol/L — ABNORMAL LOW (ref 135–145)

## 2022-10-26 LAB — HEPATITIS B SURFACE ANTIBODY, QUANTITATIVE: Hep B S AB Quant (Post): <3.1 m[IU]/mL — ABNORMAL LOW (ref >9.9)

## 2022-10-26 LAB — GLUCOSE, CAPILLARY
Glucose-Capillary: 94 mg/dL (ref 70–99)
Glucose-Capillary: 86 mg/dL (ref 70–99)
Glucose-Capillary: 75 mg/dL (ref 70–99)
Glucose-Capillary: 73 mg/dL (ref 70–99)

## 2022-10-26 LAB — BRAIN NATRIURETIC PEPTIDE: B Natriuretic Peptide: 741.8 pg/mL — ABNORMAL HIGH (ref 0.0–100.0)

## 2022-10-26 LAB — MAGNESIUM: Magnesium: 1.6 mg/dL — ABNORMAL LOW (ref 1.7–2.4)

## 2022-10-26 MED ORDER — MAGNESIUM SULFATE 2 GM/50ML IV SOLN
2.0000 g | Freq: Once | INTRAVENOUS | Status: AC
  Administered 2022-10-26: 2 g via INTRAVENOUS
  Filled 2022-10-26: qty 50

## 2022-10-26 MED ORDER — LORAZEPAM 2 MG/ML IJ SOLN
INTRAMUSCULAR | Status: AC
  Administered 2022-10-26: 2 mg via INTRAVENOUS
  Filled 2022-10-26: qty 1

## 2022-10-26 MED ORDER — LACTATED RINGERS IV SOLN: INTRAVENOUS | Status: DC

## 2022-10-26 NOTE — Procedures (Signed)
Patient seen and examined on Hemodialysis. The procedure was supervised and I have made appropriate changes. 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  Emergency rinseback.  2 mg IV ativan given.  Rapid response to the bedside.  Primary informed.  Seizure activity broke with 2 mg IV ativan.  Pt postictal.  Madelon Lips MD [place] Pgr [phone] 9:42 AM

## 2022-10-26 NOTE — Progress Notes (Signed)
At approx 0906 nurse was called to the bay by doctor Hollie Salk due to patient have seizures. Rapid was called. '2MG'$  of ativan given for seizure. Patient stabilized, HD treatment ended. Patient transported back by dialysis nurse and Rapid response. Please see flowsheet for vital trends during event.    Access used: AVF Access issuess: n/a  Total UF removed: 100ML Medication(s) given: '2MG'$  Ativan    10/26/22 0906  Vitals  BP (!) 176/78  MAP (mmHg) 107  Pulse Rate 75  ECG Heart Rate 85  Resp (!) 26  Oxygen Therapy  SpO2 100 %  During Treatment Monitoring  Intra-Hemodialysis Comments Tx completed;See progress note  Post Treatment  Dialyzer Clearance Lightly streaked  Duration of HD Treatment -hour(s) 0.23 hour(s)  Liters Processed 9.1  Fluid Removed (mL) 100 mL  Tolerated HD Treatment No (Comment)  AVG/AVF Arterial Site Held (minutes) 10 minutes  AVG/AVF Venous Site Held (minutes) 10 minutes  Fistula / Graft Right Upper arm  No placement date or time found.   Orientation: Right  Access Location: Upper arm  Site Condition No complications  Fistula / Graft Assessment Present;Thrill;Bruit  Status Deaccessed         [facility]

## 2022-10-26 NOTE — Progress Notes (Signed)
GI UPDATE  Chart reviewed - patient with recurrent seizure like episode again this AM. Until this is sorted out and controlled, cleared by neurology for anesthesia, we will be holding off on endoscopic evaluation. Will circle back and check on her tomorrow. Please call with any questions in the interim.  Jolly Mango, MD [facility]

## 2022-10-26 NOTE — Telephone Encounter (Signed)
Pt admitted to hospital

## 2022-10-26 NOTE — Progress Notes (Addendum)
[facility] Progress Note   Subjective:  Seen and on dialysis.  Had a seizure on HD shortly after my initial eval- emergency rinseback, 2 mg IV ativan given with resolution of seizure activity.  RRT called, primary informed.    Objective Vitals:   10/26/22 0908 10/26/22 0914 10/26/22 0918 10/26/22 0923  BP: (!) 155/95 (!) 153/62 (!) 146/62 (!) 134/56  Pulse: 75 73 67 67  Resp: (!) 35 '14 12 13  '$ Temp:      TempSrc:      SpO2: 98% 100% 100% 100%  Weight:      Height:        67.4 kg  Additional Objective Labs: Basic Metabolic Panel: Recent Labs  Lab 10/24/22 1042 10/25/22 0011 10/26/22 0834  NA 133* 134* 133*  K 3.8 3.6 3.9  CL 96* 97* 99  CO2 '26 25 26  '$ GLUCOSE 88 95 90  BUN 35* 14 24*  CREATININE 4.87* 2.49* 3.49*  CALCIUM 7.7* 7.6* 8.0*  PHOS 3.7 2.3* 3.2   CBC: Recent Labs  Lab 10/19/22 2127 10/24/22 0404 10/25/22 0011 10/25/22 0628 10/26/22 0834  WBC 8.1 6.7 6.2  --  4.4  NEUTROABS 7.5 5.3  --   --  3.4  HGB 9.0* 7.3* 9.1*  --  8.4*  HCT 29.7* 24.7* 29.3*  --  27.0*  MCV 91.4 92.2 91.0  --  90.9  PLT 165 118* 98* 93* 83*   Blood Culture    Component Value Date/Time   SDES BLOOD LEFT FOREARM 09/29/2022 1716   SPECREQUEST  09/29/2022 1716    BOTTLES DRAWN AEROBIC AND ANAEROBIC Blood Culture adequate volume   CULT  09/29/2022 1716    NO GROWTH 5 DAYS Performed at [facility], [place] [address] [place], [place] [address]    REPTSTATUS 10/04/2022 FINAL 09/29/2022 1716     Physical Exam General: in HD Heart: RRR  Lungs: Clear, upper airway noise Abdomen: soft non-tender  Extremities: No sig LE edema  Dialysis Access: R AVF +bruit  Neuro: stereotypical neck and facial twitching noted, RRT called, IV ativan given  Medications:  anticoagulant sodium citrate     lactated ringers 75 mL/hr at 10/25/22 2353   levETIRAcetam 500 mg (10/25/22 2103)   promethazine (PHENERGAN) injection (IM or IVPB)      sodium chloride    Intravenous Once   atorvastatin  20 mg Oral Daily   buprenorphine  1 patch Transdermal Weekly   calcitRIOL  1.25 mcg Oral Q M,W,F-HD   camphor-menthol  1 Application Topical Daily   carbidopa-levodopa  2 tablet Oral q AM   And   carbidopa-levodopa  1 tablet Oral BID   Chlorhexidine Gluconate Cloth  6 each Topical Q0600   gabapentin  300 mg Oral QHS   heparin sodium (porcine)  3,000 Units Intravenous Once in dialysis   insulin aspart  0-5 Units Subcutaneous QHS   insulin aspart  0-6 Units Subcutaneous TID WC   melatonin  5 mg Oral QHS   midodrine  20 mg Oral Q M,W,F-HD   mirtazapine  15 mg Oral QHS   nystatin  1 Application Topical Daily   pantoprazole  40 mg Oral BID   rOPINIRole  12 mg Oral Daily   sodium chloride flush  3 mL Intravenous Q12H   tamsulosin  0.4 mg Oral QHS    Dialysis Orders: [facility] MWF 3:15h  67.9 kg 3K/2Ca  AVF Hep 3000  Assessment/Plan: 1 Myoclonus/ jerking: initially thought possibly gabapentin  toxicity- CT head negative.  Elytes OK.  Stopped gabapentin. MRI pending. Now I'm wondering if all related to #2 2 Seizure:  First seizure 1/16, endo cancelled, loaded with Keppra, neuro following.  Another seizure in dialysis today- Stopped with 2 mg IV ativan.  EEG negative 3 ESRD: MWF [facility], procedure terminated d/t seizure activity 1/17--> will try again tomorrow.   4 Hypertension: BP/volume ok.  meds at home 5. Anemia of ESRD:  s/p 1 u pRBCs 1/15 will give ESA as appropriate.  Gi consulted, for endo but procedure cancelled yesterday d/t seizure. 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  Dispo: admitted  [place] Pgr [phone] 10/26/2022,9:33 AM

## 2022-10-26 NOTE — Progress Notes (Signed)
OT Cancellation Note  Patient Details Name: NOVALI VOLLMAN MRN: [id number] DOB: [birth year]   Cancelled Treatment:    Reason Eval/Treat Not Completed: Medical issues which prohibited therapy (Pt with rapid reponse called this morning due to seizure. Will continue to follow.)  Malka So 10/26/2022, 1:22 PM Cleta Alberts, OTR/L Acute Rehabilitation Services Office: [phone]

## 2022-10-26 NOTE — Progress Notes (Addendum)
PT Cancellation Note  Patient Details Name: Jean Mcclain MRN: [id number] DOB: Dec 31, 1945   Cancelled Treatment:    Reason Eval/Treat Not Completed: Medical issues which prohibited therapy (seizure this AM in dialysis, rapid response called)   Luvenia Heller 10/26/2022, 1:49 PM

## 2022-10-26 NOTE — Progress Notes (Addendum)
PROGRESS NOTE                                                                                                                                                                                                             Patient Demographics:    Jean Mcclain, is a 77 y.o. female, DOB - 07-08-1946, ZRA:[id number]  Outpatient Primary MD for the patient is Pcp, No    LOS - 1  Admit date - 10/24/2022    Chief Complaint  Patient presents with   Shaking       Brief Narrative (HPI from H&P)   77 y.o. female with medical history significant of hypertension, hyperlipidemia, diabetes mellitus type II, ESRD on HD(M/W/F), chronic respiratory failure with hypoxia on 3 L of oxygen at night, Parkinson's disease, history of DVT not on anticoagulation, and wheelchair-bound due to left ankle fracture who presents for worsening shaking and jerking over the last 4 days or so.  She is recently admitted for diverticulitis with suspicion for mild contained perforation a month ago, she was also being worked up for an outpatient colonoscopy for possible occult GI bleed.  During her presentation in the ER she was found to be profoundly anemic, Hemoccult was negative, she was transfused with 1 unit of packed RBC in the ER subsequently she had a witnessed seizure-like episode as well.  This morning she is symptom-free she has been seen by GI and neurology has been consulted as well.  Her head CT thus far has been unremarkable and EEG is pending.    Subjective:    Jean Mcclain today has, No headache, No chest pain, No abdominal pain - No Nausea, No new weakness tingling or numbness, no SOB   Assessment  & Plan :    Symptomatic acute on chronic anemia.  Possible occult GI blood loss with recent history of diverticulitis and pending outpatient colonoscopy.  Workup negative for DIC, LDH stable ruling out any intravascular hemolysis, she is s/p 1 unit of  packed RBC transfusion on 10/24/2022 in the ER, she is on PPI, of note she is currently being worked for possible diverticular mass versus persistent diverticulitis and is due for colonoscopy.  Defer further management of GI issues to GI team.  Continue to monitor H&H.  For any antibiotic to the GI team as well.  Currently abdominal exam  is nontender and she does not complain of any abdominal pain.  New onset seizures 2 episodes in the hospital both witnessed.  EEG non acute, head CT negative, placed on scheduled Keppra along with as needed Ativan for breakthrough seizure, neuro on board.  MRI brain pending.  Acute on chronic hypoxic respiratory failure with underlying OSA.  She normally uses 3 L of nasal cannula oxygen, likely some worsening due to anemia, supportive care, much improved this morning.  Continue to monitor.  ESRD on HD.  Patient on MWF schedule.  Nephrology on board.  History of DVT in 2018.  Not on anticoagulation anymore, repeat lower extremity venous ultrasound ordered during admission remains negative.  History of Parkinson's disease.  Continue home regimen.  History of sick euthyroid syndrome.  Stable TSH now.  Mild persistent thrombocytopenia.  Stable outpatient workup.  Kidney lesion - Noted to be increased in size on CT scan from 07/2022 for which MRI of the abdomen recommended in 6 months at that time, PCP to schedule it for April 2024.  DM type II.  Currently on sliding scale monitor and adjust.  Lab Results  Component Value Date   HGBA1C 6.9 (H) 07/22/2022   CBG (last 3)  Recent Labs    10/25/22 1613 10/25/22 2156 10/26/22 0809  GLUCAP 95 88 94        Condition - Extremely Guarded  Family Communication  :  None  Code Status : DNR  Consults  :  GI, Renal, Neuro  PUD Prophylaxis : PPI   Procedures  :     EGD -  Lower extremity venous duplex.  No DVT.  EEG - No seizure  CT Head - Non acute      Disposition Plan  :    Status is:  Observation   DVT Prophylaxis  :  SCD  Place and maintain sequential compression device Start: 10/25/22 0836 Place TED hose Start: 10/24/22 0737  Lab Results  Component Value Date   PLT 83 (L) 10/26/2022    Diet :  Diet Order             Diet NPO time specified Except for: Sips with Meds  Diet effective midnight                    Inpatient Medications  Scheduled Meds:  sodium chloride   Intravenous Once   atorvastatin  20 mg Oral Daily   buprenorphine  1 patch Transdermal Weekly   calcitRIOL  1.25 mcg Oral Q M,W,F-HD   camphor-menthol  1 Application Topical Daily   carbidopa-levodopa  2 tablet Oral q AM   And   carbidopa-levodopa  1 tablet Oral BID   Chlorhexidine Gluconate Cloth  6 each Topical Q0600   gabapentin  300 mg Oral QHS   heparin sodium (porcine)  3,000 Units Intravenous Once in dialysis   insulin aspart  0-5 Units Subcutaneous QHS   insulin aspart  0-6 Units Subcutaneous TID WC   melatonin  5 mg Oral QHS   midodrine  20 mg Oral Q M,W,F-HD   mirtazapine  15 mg Oral QHS   nystatin  1 Application Topical Daily   pantoprazole  40 mg Oral BID   rOPINIRole  12 mg Oral Daily   sodium chloride flush  3 mL Intravenous Q12H   tamsulosin  0.4 mg Oral QHS   Continuous Infusions:  anticoagulant sodium citrate     lactated ringers 75 mL/hr at 10/25/22 2353  levETIRAcetam 500 mg (10/25/22 2103)   promethazine (PHENERGAN) injection (IM or IVPB)     PRN Meds:.acetaminophen **OR** acetaminophen, albuterol, alteplase, anticoagulant sodium citrate, dextrose, fluticasone, heparin, heparin, heparin, hydrALAZINE, hydrOXYzine, lidocaine (PF), lidocaine-prilocaine, LORazepam, ondansetron **OR** ondansetron (ZOFRAN) IV, mouth rinse, pentafluoroprop-tetrafluoroeth, promethazine (PHENERGAN) injection (IM or IVPB), simethicone  Antibiotics  :    Anti-infectives (From admission, onward)    None         Objective:   Vitals:   10/25/22 2200 10/25/22 2307 10/26/22  0308 10/26/22 0500  BP:  (!) 136/55 (!) 119/47   Pulse: (!) 55 60 65   Resp: '12 14 11   '$ Temp:  98.9 F (37.2 C) 98.8 F (37.1 C)   TempSrc:  Oral Oral   SpO2: 99% 100% 100%   Weight:    66.7 kg  Height:        Wt Readings from Last 3 Encounters:  10/26/22 66.7 kg  10/19/22 70.8 kg  09/29/22 70.8 kg     Intake/Output Summary (Last 24 hours) at 10/26/2022 0859 Last data filed at 10/25/2022 2102 Gross per 24 hour  Intake 499.25 ml  Output --  Net 499.25 ml     Physical Exam  Awake Alert, No new F.N deficits, Normal affect [facility].AT,PERRAL Supple Neck, No JVD,   Symmetrical Chest wall movement, Good air movement bilaterally, CTAB RRR,No Gallops, Rubs or new Murmurs,  +ve B.Sounds, Abd Soft, No tenderness,   No Cyanosis, Clubbing or edema      RN pressure injury documentation: Pressure Injury 10/25/22 Sacrum Mid Stage 1 -  Intact skin with non-blanchable redness of a localized area usually over a bony prominence. rednes noted (Active)  10/25/22 1445  Location: Sacrum  Location Orientation: Mid  Staging: Stage 1 -  Intact skin with non-blanchable redness of a localized area usually over a bony prominence.  Wound Description (Comments): rednes noted  Present on Admission: Yes  Dressing Type Foam - Lift dressing to assess site every shift 10/25/22 2000      Data Review:    Recent Labs  Lab 10/19/22 2127 10/24/22 0404 10/25/22 0011 10/25/22 0628 10/26/22 0834  WBC 8.1 6.7 6.2  --  4.4  HGB 9.0* 7.3* 9.1*  --  8.4*  HCT 29.7* 24.7* 29.3*  --  27.0*  PLT 165 118* 98* 93* 83*  MCV 91.4 92.2 91.0  --  90.9  MCH 27.7 27.2 28.3  --  28.3  MCHC 30.3 29.6* 31.1  --  31.1  RDW 19.4* 19.7* 18.3*  --  18.3*  LYMPHSABS 0.3* 0.5*  --   --  0.5*  MONOABS 0.2 0.3  --   --  0.3  EOSABS 0.0 0.5  --   --  0.2  BASOSABS 0.0 0.0  --   --  0.0    Recent Labs  Lab 10/19/22 2127 10/24/22 0404 10/24/22 1042 10/25/22 0011 10/25/22 0628  NA 137 135 133* 134*  --   K 4.1 3.8  3.8 3.6  --   CL 97* 96* 96* 97*  --   CO2 '30 27 26 25  '$ --   ANIONGAP '10 12 11 12  '$ --   GLUCOSE 96 82 88 95  --   BUN 10 34* 35* 14  --   CREATININE 2.03* 4.74* 4.87* 2.49*  --   AST 6* 7*  --   --   --   ALT <5 5  --   --   --   ALKPHOS 120  96  --   --   --   BILITOT 0.7 0.8  --   --   --   ALBUMIN 3.2* 2.7* 2.7* 2.8*  --   CRP  --   --  5.2*  --   --   DDIMER  --   --   --   --  2.17*  INR  --   --   --   --  1.1  TSH  --   --  2.455  --   --   BNP 929.4*  --   --   --   --   MG  --  1.7  --   --   --   CALCIUM 8.5* 7.8* 7.7* 7.6*  --        Radiology Reports EEG adult  Result Date: 10/25/2022 Lora Havens, MD     10/25/2022  2:02 PM Patient Name: ZAIRA IACOVELLI MRN: [id number] Epilepsy Attending: Lora Havens Referring Physician/Provider: Ripley Fraise, MD Date: 10/25/2022 Duration: 24.06 mins Patient history: 77 year old female with seizure-like episodes.  EEG to evaluate for seizure. Level of alertness: Awake, asleep AEDs during EEG study: LEV, versed Technical aspects: This EEG study was done with scalp electrodes positioned according to the 10-20 International system of electrode placement. Electrical activity was reviewed with band pass filter of 1-'70Hz'$ , sensitivity of 7 uV/mm, display speed of 60m/sec with a '60Hz'$  notched filter applied as appropriate. EEG data were recorded continuously and digitally stored.  Video monitoring was available and reviewed as appropriate. Description: During awake state, no clear posterior dominant rhythm was seen.  EEG showed continuous generalized predominantly 5 to 7 Hz theta slowing admixed with 2 to 3 Hz generalized delta slowing.  Sleep was characterized by vertex waves, sleep spindles (12 to 14 Hz), maximal frontocentral region. Hyperventilation and photic stimulation were not performed.   ABNORMALITY - Continuous slow, generalized IMPRESSION: This study is suggestive of moderate diffuse encephalopathy, nonspecific etiology.  No  seizures or epileptiform discharges were seen throughout the recording. Priyanka O Yadav   VAS [place]OWER EXTREMITY VENOUS (DVT)  Result Date: 10/24/2022  Lower Venous DVT Study Patient Name:  RPHENIX GREIN Date of Exam:   10/24/2022 Medical Rec #: [id number]        Accession #:    [id number] of Birth: 5June 05, 1947         Patient Gender: F Patient Age:   89years [facility]      VAS [place]OWER EXTREMITY VENOUS (DVT) Referring Phys: RFuller Plan--------------------------------------------------------------------------------  Indications: Swelling.  Comparison Study: Prior negative bilateral LEV done 07/22/22 Performing Technologist: CSharion DoveRVS  Examination Guidelines: A complete evaluation includes B-mode imaging, spectral Doppler, color Doppler, and power Doppler as needed of all accessible portions of each vessel. Bilateral testing is considered an integral part of a complete examination. Limited examinations for reoccurring indications may be performed as noted. The reflux portion of the exam is performed with the patient in reverse Trendelenburg.  +---------+---------------+---------+-----------+----------+-------------------+ RIGHT    CompressibilityPhasicitySpontaneityPropertiesThrombus Aging      +---------+---------------+---------+-----------+----------+-------------------+ CFV      Full           Yes      Yes                                      +---------+---------------+---------+-----------+----------+-------------------+  SFJ      Full                                                             +---------+---------------+---------+-----------+----------+-------------------+ FV Prox  Full                                                             +---------+---------------+---------+-----------+----------+-------------------+ FV Mid   Full                                                              +---------+---------------+---------+-----------+----------+-------------------+ FV DistalFull                                                             +---------+---------------+---------+-----------+----------+-------------------+ PFV      Full                                                             +---------+---------------+---------+-----------+----------+-------------------+ POP      Full           Yes      Yes                                      +---------+---------------+---------+-----------+----------+-------------------+ PTV                                                   Not well visualized +---------+---------------+---------+-----------+----------+-------------------+ PERO                                                  Not well visualized +---------+---------------+---------+-----------+----------+-------------------+   +---------+---------------+---------+-----------+----------+--------------+ LEFT     CompressibilityPhasicitySpontaneityPropertiesThrombus Aging +---------+---------------+---------+-----------+----------+--------------+ CFV      Full           Yes      Yes                                 +---------+---------------+---------+-----------+----------+--------------+ SFJ      Full                                                        +---------+---------------+---------+-----------+----------+--------------+  FV Prox  Full                                                        +---------+---------------+---------+-----------+----------+--------------+ FV Mid   Full                                                        +---------+---------------+---------+-----------+----------+--------------+ FV DistalFull                                                        +---------+---------------+---------+-----------+----------+--------------+ PFV      Full                                                         +---------+---------------+---------+-----------+----------+--------------+ POP      Full           Yes      Yes                                 +---------+---------------+---------+-----------+----------+--------------+ PTV      Full                                                        +---------+---------------+---------+-----------+----------+--------------+ PERO     Full                                                        +---------+---------------+---------+-----------+----------+--------------+     Summary: BILATERAL: - No evidence of deep vein thrombosis seen in the lower extremities, bilaterally. - RIGHT: - No cystic structure found in the popliteal fossa.  LEFT: - No cystic structure found in the popliteal fossa.  *See table(s) above for measurements and observations. Electronically signed by Orlie Pollen on 10/24/2022 at 6:47:57 PM.    Final    CT Head Wo Contrast  Result Date: 10/24/2022 CLINICAL DATA:  77 year old female with jerking, shaking. EXAM: CT HEAD WITHOUT CONTRAST TECHNIQUE: Contiguous axial images were obtained from the base of the skull through the vertex without intravenous contrast. RADIATION DOSE REDUCTION: This exam was performed according to the departmental dose-optimization program which includes automated exposure control, adjustment of the mA and/or kV according to patient size and/or use of iterative reconstruction technique. COMPARISON:  Brain MRI 07/21/2022.  Head CT 07/03/2020. FINDINGS: Brain: Cerebral volume is not significantly changed since 2021, within normal limits for age. 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  No midline shift, ventriculomegaly, mass effect, evidence of mass lesion, intracranial hemorrhage or evidence of cortically based acute infarction. Gray-white matter differentiation is stable and within normal limits for age. Vascular: Calcified atherosclerosis at the skull  base. No suspicious intracranial vascular hyperdensity. Skull: No acute osseous abnormality identified. Sinuses/Orbits: Visualized paranasal sinuses and mastoids are stable and well aerated. Other: No acute orbit or scalp soft tissue finding. IMPRESSION: Stable and normal for age non contrast CT appearance of the brain. Electronically Signed   By: Genevie Ann M.D.   On: 10/24/2022 05:18   DG Chest Port 1 View  Result Date: 10/24/2022 CLINICAL DATA:  77 year old female missed dialysis. EXAM: PORTABLE CHEST 1 VIEW COMPARISON:  Portable chest 10/19/2022 and earlier. FINDINGS: Portable AP semi upright view at 0400 hours. Stable lung volumes. Stable cardiac size and mediastinal contours. On going left lung base hypo ventilation which appears in part related to small pleural effusion. Increased right lung and lung base veiling opacity and blunting of the right costophrenic angle. No superimposed pneumothorax, pulmonary edema, or consolidation. Paucity of bowel gas in the visible abdomen. Stable visualized osseous structures. Both glenohumeral joints are chronically dislocated since at least 07/21/2022. IMPRESSION: 1. Small bilateral pleural effusions, increased on the right since 10/19/2022. 2. No other acute cardiopulmonary abnormality. 3. Chronic bilateral glenohumeral joint dislocations. Electronically Signed   By: Genevie Ann M.D.   On: 10/24/2022 04:31      Signature  -   Lala Lund M.D on 10/26/2022 at 8:59 AM   -  To page go to [email or web address]

## 2022-10-26 NOTE — Progress Notes (Signed)
LTM maint complete - no skin breakdown [facility] monitored, Event button test confirmed by [facility]. ? ?

## 2022-10-26 NOTE — Progress Notes (Signed)
Subjective: Had seizure-like activity this morning during hemodialysis described as right gaze, head movement and right arm and leg twitching, responsive to staff lasting about 4 minutes.  Received 2 mg IV Ativan.  When I saw the patient she was lethargic and not following commands but rest of the neuroexam was nonfocal.  ROS: Unable to obtain due to poor mental status  Examination  Vital signs in last 24 hours: Temp:  [96.4 F (35.8 C)-98.9 F (37.2 C)] 97.7 F (36.5 C) (01/17 0938) Pulse Rate:  [52-75] 67 (01/17 0938) Resp:  [10-35] 17 (01/17 0938) BP: (119-176)/(45-95) 120/54 (01/17 0938) SpO2:  [97 %-100 %] 100 % (01/17 0938) Weight:  [64.5 kg-67.4 kg] 67.4 kg (01/17 0820)  General: lying in bed, NAD Neuro: Opens eyes to noxious stimulation, did not look at examiner, did not follow commands, PERRLA, no forced gaze deviation, withdraws noxious stimuli in all 4 extremities  Basic Metabolic Panel: Recent Labs  Lab 10/19/22 2127 10/24/22 0404 10/24/22 1042 10/25/22 0011 10/26/22 0834  NA 137 135 133* 134* 133*  K 4.1 3.8 3.8 3.6 3.9  CL 97* 96* 96* 97* 99  CO2 '30 27 26 25 26  '$ GLUCOSE 96 82 88 95 90  BUN 10 34* 35* 14 24*  CREATININE 2.03* 4.74* 4.87* 2.49* 3.49*  CALCIUM 8.5* 7.8* 7.7* 7.6* 8.0*  MG  --  1.7  --   --  1.6*  PHOS  --  3.8 3.7 2.3* 3.2    CBC: Recent Labs  Lab 10/19/22 2127 10/24/22 0404 10/25/22 0011 10/25/22 0628 10/26/22 0834  WBC 8.1 6.7 6.2  --  4.4  NEUTROABS 7.5 5.3  --   --  3.4  HGB 9.0* 7.3* 9.1*  --  8.4*  HCT 29.7* 24.7* 29.3*  --  27.0*  MCV 91.4 92.2 91.0  --  90.9  PLT 165 118* 98* 93* 83*     Coagulation Studies: Recent Labs    10/25/22 0628  LABPROT 14.2  INR 1.1   Imaging CT head without contrast 10/24/2022: Stable and normal for age non contrast CT appearance of the brain.   ASSESSMENT AND PLAN: 77 year old wheelchair-bound female with history of hypertension, hyperlipidemia, diabetes, end-stage renal disease on  hemodialysis parkinsonism, RLS and myoclonus who has been having multiple seizure-like episodes.  Seizure-like episode Acute encephalopathy -Encephalopathy likely due to recent Ativan administration -Unclear if there is a new onset seizures versus nonepileptic events.  Recommendations -Will obtain MRI brain without contrast to the point abnormality -Will obtain video EEG for characterization of spells But will hold off on any AEDs unless we see any definite ictal-interictal abnormality on EEG -Avoid using Ativan for these episodes unless it is affecting vital signs.  Discussed plan with RN -Continue seizure precautions -Management of rest of comorbidities per primary team -Discussed plan with Dr. Candiss Norse  I have spent a total of   36 minutes with the patient reviewing hospital notes,  test results, labs and examining the patient as well as establishing an assessment and plan.  > 50% of time was spent in direct patient care.       Zeb Comfort Epilepsy [facility] For questions after 5pm please refer to AMION to reach the Neurologist on call

## 2022-10-26 NOTE — Significant Event (Signed)
Rapid Response Event Note   Reason for Call :  Seizure activity  Initial Focused Assessment:  Upon my arrival patient is actively seizing.  She has a right gaze, Head movement and right arm and leg twitching.  She was unresponsive to staff during seizure activity.  Per staff seizure lasted about 4 minutes. BP 155/95  HR 85  RR 24  O2 sat 100% on 5l [place]  Dr Hollie Salk at bedside during event  Interventions:  '2mg'$  Ativan IV HD treatment emergently ended, see HD records for details  Reassessment: Post seizure and Ativan administration patient became drowsy.  BP 120/54  HR 71  RR 17  O2 sat 100% on 2L [place]  Plan of Care:     Event Summary:   MD Notified: Lala Lund Call Time: [phone] Arrival Time: 0908 End Time: 0945  Raliegh Ip, RN

## 2022-10-26 NOTE — Progress Notes (Signed)
OT Cancellation Note  Patient Details Name: Jean Mcclain MRN: [id number] DOB: May 31, 1946   Cancelled Treatment:    Reason Eval/Treat Not Completed: Patient at procedure or test/ unavailable (Pt in HD)  Malka So 10/26/2022, 9:16 AM Cleta Alberts, OTR/L [place] Office: [phone]

## 2022-10-26 NOTE — Progress Notes (Signed)
vLTM started all impedances below 10kohms.  [facility] notified.  Patient event button tested. MRI leads used

## 2022-10-27 ENCOUNTER — Inpatient Hospital Stay (HOSPITAL_COMMUNITY): Payer: PPO

## 2022-10-27 DIAGNOSIS — R569 Unspecified convulsions: Secondary | ICD-10-CM | POA: Diagnosis not present

## 2022-10-27 DIAGNOSIS — Z8673 Personal history of transient ischemic attack (TIA), and cerebral infarction without residual deficits: Secondary | ICD-10-CM | POA: Diagnosis not present

## 2022-10-27 DIAGNOSIS — D649 Anemia, unspecified: Secondary | ICD-10-CM | POA: Diagnosis not present

## 2022-10-27 LAB — CBC WITH DIFFERENTIAL/PLATELET
Abs Immature Granulocytes: 0.02 10*3/uL (ref 0.00–0.07)
Basophils Absolute: 0 10*3/uL (ref 0.0–0.1)
Basophils Relative: 1 %
Eosinophils Absolute: 0.3 10*3/uL (ref 0.0–0.5)
Eosinophils Relative: 9 %
HCT: 27.7 % — ABNORMAL LOW (ref 36.0–46.0)
Hemoglobin: 8.2 g/dL — ABNORMAL LOW (ref 12.0–15.0)
Immature Granulocytes: 1 %
Lymphocytes Relative: 13 %
Lymphs Abs: 0.5 10*3/uL — ABNORMAL LOW (ref 0.7–4.0)
MCH: 27.6 pg (ref 26.0–34.0)
MCHC: 29.6 g/dL — ABNORMAL LOW (ref 30.0–36.0)
MCV: 93.3 fL (ref 80.0–100.0)
Monocytes Absolute: 0.2 10*3/uL (ref 0.1–1.0)
Monocytes Relative: 6 %
Neutro Abs: 2.8 10*3/uL (ref 1.7–7.7)
Neutrophils Relative %: 70 %
Platelets: 80 10*3/uL — ABNORMAL LOW (ref 150–400)
RBC: 2.97 MIL/uL — ABNORMAL LOW (ref 3.87–5.11)
RDW: 18.1 % — ABNORMAL HIGH (ref 11.5–15.5)
WBC: 3.9 10*3/uL — ABNORMAL LOW (ref 4.0–10.5)
nRBC: 0 % (ref 0.0–0.2)

## 2022-10-27 LAB — RENAL FUNCTION PANEL
Albumin: 2.4 g/dL — ABNORMAL LOW (ref 3.5–5.0)
Anion gap: 8 (ref 5–15)
BUN: 24 mg/dL — ABNORMAL HIGH (ref 8–23)
CO2: 27 mmol/L (ref 22–32)
Calcium: 8.1 mg/dL — ABNORMAL LOW (ref 8.9–10.3)
Chloride: 100 mmol/L (ref 98–111)
Creatinine, Ser: 3.84 mg/dL — ABNORMAL HIGH (ref 0.44–1.00)
GFR, Estimated: 12 mL/min — ABNORMAL LOW (ref >60)
Glucose, Bld: 66 mg/dL — ABNORMAL LOW (ref 70–99)
Phosphorus: 3.9 mg/dL (ref 2.5–4.6)
Potassium: 4.1 mmol/L (ref 3.5–5.1)
Sodium: 135 mmol/L (ref 135–145)

## 2022-10-27 LAB — GLUCOSE, CAPILLARY
Glucose-Capillary: 76 mg/dL (ref 70–99)
Glucose-Capillary: 79 mg/dL (ref 70–99)
Glucose-Capillary: 64 mg/dL — ABNORMAL LOW (ref 70–99)
Glucose-Capillary: 79 mg/dL (ref 70–99)
Glucose-Capillary: 76 mg/dL (ref 70–99)

## 2022-10-27 LAB — LIPID PANEL
Cholesterol: 107 mg/dL (ref 0–200)
HDL: 34 mg/dL — ABNORMAL LOW (ref >40)
LDL Cholesterol: 33 mg/dL (ref 0–99)
Total CHOL/HDL Ratio: 3.1 RATIO
Triglycerides: 199 mg/dL — ABNORMAL HIGH (ref <150)
VLDL: 40 mg/dL (ref 0–40)

## 2022-10-27 LAB — BRAIN NATRIURETIC PEPTIDE: B Natriuretic Peptide: 411.3 pg/mL — ABNORMAL HIGH (ref 0.0–100.0)

## 2022-10-27 LAB — MAGNESIUM: Magnesium: 2.2 mg/dL (ref 1.7–2.4)

## 2022-10-27 MED ORDER — MIDAZOLAM HCL 2 MG/2ML IJ SOLN
2.0000 mg | INTRAMUSCULAR | Status: DC | PRN
  Administered 2022-10-30 – 2022-10-31 (×2): 2 mg via INTRAVENOUS
  Filled 2022-10-27 (×3): qty 2

## 2022-10-27 MED ORDER — LEVETIRACETAM 500 MG PO TABS
500.0000 mg | ORAL_TABLET | Freq: Two times a day (BID) | ORAL | Status: DC
  Administered 2022-10-28 – 2022-10-30 (×3): 500 mg via ORAL
  Filled 2022-10-27 (×5): qty 1

## 2022-10-27 MED ORDER — ASPIRIN 81 MG PO CHEW
81.0000 mg | CHEWABLE_TABLET | Freq: Every day | ORAL | Status: DC
  Administered 2022-10-27 – 2022-11-03 (×6): 81 mg via ORAL
  Filled 2022-10-27 (×8): qty 1

## 2022-10-27 MED ORDER — SODIUM CHLORIDE 0.9 % IV SOLN
250.0000 mg | Freq: Once | INTRAVENOUS | Status: DC
  Filled 2022-10-27: qty 2.5

## 2022-10-27 MED ORDER — SODIUM CHLORIDE 0.9 % IV SOLN
250.0000 mg | INTRAVENOUS | Status: DC
  Filled 2022-10-27 (×2): qty 2.5

## 2022-10-27 MED ORDER — SODIUM CHLORIDE 0.9 % IV SOLN
250.0000 mg | Freq: Two times a day (BID) | INTRAVENOUS | Status: DC
  Filled 2022-10-27: qty 2.5

## 2022-10-27 MED ORDER — LEVETIRACETAM 250 MG PO TABS: 250.0000 mg | ORAL_TABLET | Freq: Two times a day (BID) | ORAL | Status: DC

## 2022-10-27 MED ORDER — ATORVASTATIN CALCIUM 40 MG PO TABS
40.0000 mg | ORAL_TABLET | Freq: Every day | ORAL | Status: DC
  Administered 2022-10-28 – 2022-11-06 (×6): 40 mg via ORAL
  Filled 2022-10-27 (×8): qty 1

## 2022-10-27 MED ORDER — IOHEXOL 350 MG/ML SOLN
75.0000 mL | Freq: Once | INTRAVENOUS | Status: AC | PRN
  Administered 2022-10-27: 75 mL via INTRAVENOUS

## 2022-10-27 MED ORDER — LORAZEPAM 2 MG/ML IJ SOLN: 2.0000 mg | Freq: Four times a day (QID) | INTRAMUSCULAR | Status: DC | PRN

## 2022-10-27 MED ORDER — LEVETIRACETAM IN NACL 500 MG/100ML IV SOLN
500.0000 mg | Freq: Two times a day (BID) | INTRAVENOUS | Status: DC
  Administered 2022-10-27 – 2022-11-02 (×10): 500 mg via INTRAVENOUS
  Filled 2022-10-27 (×11): qty 100

## 2022-10-27 MED ORDER — LEVETIRACETAM 250 MG PO TABS
250.0000 mg | ORAL_TABLET | ORAL | Status: DC
  Administered 2022-10-28: 250 mg via ORAL
  Filled 2022-10-27: qty 1

## 2022-10-27 NOTE — Progress Notes (Addendum)
Subjective: No seizure-like activity overnight.  More awake and conversant today.  Denies any prior history of seizures.  Denies ever having a stroke.  States she has pain in her back.  Denies any other concerns.  ROS: negative except above Examination  Vital signs in last 24 hours: Temp:  [97.5 F (36.4 C)-98.1 F (36.7 C)] 97.5 F (36.4 C) (01/18 1245) Pulse Rate:  [59-70] 70 (01/18 1245) Resp:  [14-19] 19 (01/18 0736) BP: (107-122)/(49-56) 107/49 (01/18 0736) SpO2:  [96 %-98 %] 97 % (01/18 1245) Weight:  [70.9 kg] 70.9 kg (01/18 1245)  General: lying in bed, NAD Neuro: AOx3, no aphasia, cranial nerves II to XII grossly intact, 5/5 in bilateral upper extremities, 3/5 in bilateral lower extremities, FTN intact, intact to light touch  Basic Metabolic Panel: Recent Labs  Lab 10/24/22 0404 10/24/22 1042 10/25/22 0011 10/26/22 0834 10/27/22 0253  NA 135 133* 134* 133* 135  K 3.8 3.8 3.6 3.9 4.1  CL 96* 96* 97* 99 100  CO2 '27 26 25 26 27  '$ GLUCOSE 82 88 95 90 66*  BUN 34* 35* 14 24* 24*  CREATININE 4.74* 4.87* 2.49* 3.49* 3.84*  CALCIUM 7.8* 7.7* 7.6* 8.0* 8.1*  MG 1.7  --   --  1.6* 2.2  PHOS 3.8 3.7 2.3* 3.2 3.9    CBC: Recent Labs  Lab 10/24/22 0404 10/25/22 0011 10/25/22 0628 10/26/22 0834 10/27/22 0253  WBC 6.7 6.2  --  4.4 3.9*  NEUTROABS 5.3  --   --  3.4 2.8  HGB 7.3* 9.1*  --  8.4* 8.2*  HCT 24.7* 29.3*  --  27.0* 27.7*  MCV 92.2 91.0  --  90.9 93.3  PLT 118* 98* 93* 83* 80*     Coagulation Studies: Recent Labs    10/25/22 0628  LABPROT 14.2  INR 1.1    Imaging MRI MRI brain without contrast 10/26/2022:  No evidence of acute intracranial abnormality. 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    ASSESSMENT AND PLAN: 77 year old wheelchair-bound female with history of hypertension, hyperlipidemia, diabetes, end-stage renal disease on hemodialysis parkinsonism, RLS and myoclonus who has been having multiple  seizure-like episodes.   New onset seizure Chronic stroke, left parietal Acute encephalopathy, resolved - seizure likely due to underlying stroke. Didn't get keppra prior to HD.  - stroke etiology: likely cardioembolic  Recommendations - Will dc eeg tomorrow if no further events overnight - continue Keppra '500mg'$  BID and '250mg'$  additional dose after HD  - Start ASA '81mg'$  daily and increase atorva '40mg'$  daily for sec stroke prevention. Lipid panel ordered and pending - recommend 30 days event monitor to look for paroxysmal A fib for stroke workup - PTN IV versed for seizure lasting over 5 minutes.  -Continue seizure precautions -Management of rest of comorbidities per primary team -Discussed plan with Dr. Candiss Norse   I have spent a total of 36 minutes with the patient reviewing hospital notes,  test results, labs and examining the patient as well as establishing an assessment and plan.  > 50% of time was spent in direct patient care.  Zeb Comfort Epilepsy [facility] For questions after 5pm please refer to AMION to reach the Neurologist on call

## 2022-10-27 NOTE — Telephone Encounter (Signed)
Pt admitted to hospital

## 2022-10-27 NOTE — Progress Notes (Signed)
PT Cancellation Note  Patient Details Name: ERMEL VERNE MRN: [id number] DOB: August 02, 1946   Cancelled Treatment:    Reason Eval/Treat Not Completed: Medical issues which prohibited therapy. Pt with seizure yesterday in dialysis, currently on EEG. RN requesting to hold PT session today 2/2 pt not following commands and lethargic. PT will follow up as appropriate.    Luvenia Heller 10/27/2022, 10:43 AM

## 2022-10-27 NOTE — Progress Notes (Addendum)
PROGRESS NOTE                                                                                                                                                                                                             Patient Demographics:    Jean Mcclain, is a 77 y.o. female, DOB - Sep 22, 1946, HDQ:[id number]  Outpatient Primary MD for the patient is Pcp, No    LOS - 2  Admit date - 10/24/2022    Chief Complaint  Patient presents with   Shaking       Brief Narrative (HPI from H&P)   77 y.o. female with medical history significant of hypertension, hyperlipidemia, diabetes mellitus type II, ESRD on HD(M/W/F), chronic respiratory failure with hypoxia on 3 L of oxygen at night, Parkinson's disease, history of DVT not on anticoagulation, and wheelchair-bound due to left ankle fracture who presents for worsening shaking and jerking over the last 4 days or so.  She is recently admitted for diverticulitis with suspicion for mild contained perforation a month ago, she was also being worked up for an outpatient colonoscopy for possible occult GI bleed.  During her presentation in the ER she was found to be profoundly anemic, Hemoccult was negative, she was transfused with 1 unit of packed RBC in the ER subsequently she had a witnessed seizure-like episode as well.  This morning she is symptom-free she has been seen by GI and neurology has been consulted as well.  Her head CT thus far has been unremarkable and EEG is pending.    Subjective:    Jean Mcclain today has, No headache, No chest pain, No abdominal pain - No Nausea, No new weakness tingling or numbness, no SOB   Assessment  & Plan :    Symptomatic acute on chronic anemia.  Possible occult GI blood loss with recent history of diverticulitis and pending outpatient colonoscopy.  Workup negative for DIC, LDH stable ruling out any intravascular hemolysis, she is s/p 1 unit of  packed RBC transfusion on 10/24/2022 in the ER, she is on PPI, of note she is currently being worked for possible diverticular mass versus persistent diverticulitis and is due for colonoscopy.  Defer further management of GI issues to GI team.  Continue to monitor H&H.  For any antibiotic to the GI team as well.  Currently abdominal exam  is nontender and she does not complain of any abdominal pain.  New onset seizures 2 episodes in the hospital both witnessed.  EEG non acute, head CT negative, placed on scheduled Keppra along with as needed Ativan for breakthrough seizure, neuro on board, MRI brain noted with old stroke and some nonspecific neck findings.  Case discussed with neurology on 10/27/2022 continue on Keppra and monitor.  Incidental neck mass noted on MRI.  CT neck ordered with without contrast.    Acute on chronic hypoxic respiratory failure with underlying OSA.  She normally uses 3 L of nasal cannula oxygen, likely some worsening due to anemia, supportive care, much improved this morning.  Continue to monitor.  ESRD on HD.  Patient on MWF schedule.  Nephrology on board.  History of DVT in 2018.  Not on anticoagulation anymore, repeat lower extremity venous ultrasound ordered during admission remains negative.  History of Parkinson's disease.  Continue home regimen.  History of sick euthyroid syndrome.  Stable TSH now.  Mild persistent thrombocytopenia.  Stable outpatient workup.  Kidney lesion - Noted to be increased in size on CT scan from 07/2022 for which MRI of the abdomen recommended in 6 months at that time, PCP to schedule it for April 2024.  DM type II.  Currently on sliding scale monitor and adjust.  Lab Results  Component Value Date   HGBA1C 6.9 (H) 07/22/2022   CBG (last 3)  Recent Labs    10/26/22 2109 10/27/22 0237 10/27/22 0735  GLUCAP 73 76 79        Condition - Extremely Guarded  Family Communication  : Called brother Jean Mcclain [phone]  on  10/27/2022 at 12:46 PM - non working number, [phone]   Code Status : DNR  Consults  :  GI, Renal, Neuro  PUD Prophylaxis : PPI   Procedures  :     EGD -  Long-term EEG.    Lower extremity venous duplex.  No DVT.  EEG - No seizure  CT Head - Non acute  MRI - 1. Motion degraded examination. 2. No evidence of acute intracranial abnormality. 3. 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 4. Incompletely imaged and incompletely assessed 2.3 cm ovoid subcutaneous mass/lesion within the left lower neck/left shoulder soft tissues. A neck CT is recommended for further evaluation.      Disposition Plan  :    Status is: Observation   DVT Prophylaxis  :  SCD  Place and maintain sequential compression device Start: 10/25/22 0836 Place TED hose Start: 10/24/22 0737  Lab Results  Component Value Date   PLT 80 (L) 10/27/2022    Diet :  Diet Order             DIET SOFT Room service appropriate? Yes; Fluid consistency: Thin; Fluid restriction: 1200 mL Fluid  Diet effective now                    Inpatient Medications  Scheduled Meds:  sodium chloride   Intravenous Once   atorvastatin  20 mg Oral Daily   buprenorphine  1 patch Transdermal Weekly   calcitRIOL  1.25 mcg Oral Q M,W,F-HD   camphor-menthol  1 Application Topical Daily   carbidopa-levodopa  2 tablet Oral q AM   And   carbidopa-levodopa  1 tablet Oral BID   Chlorhexidine Gluconate Cloth  6 each Topical Q0600   gabapentin  300 mg Oral QHS   insulin aspart  0-5 Units  Subcutaneous QHS   insulin aspart  0-6 Units Subcutaneous TID WC   levETIRAcetam  250 mg Oral BID   melatonin  5 mg Oral QHS   midodrine  20 mg Oral Q M,W,F-HD   mirtazapine  15 mg Oral QHS   nystatin  1 Application Topical Daily   pantoprazole  40 mg Oral BID   rOPINIRole  12 mg Oral Daily   sodium chloride flush  3 mL Intravenous Q12H   tamsulosin  0.4 mg Oral QHS   Continuous Infusions:   anticoagulant sodium citrate     levETIRAcetam     levETIRAcetam 500 mg (10/26/22 2218)   promethazine (PHENERGAN) injection (IM or IVPB)     PRN Meds:.acetaminophen **OR** acetaminophen, albuterol, alteplase, anticoagulant sodium citrate, dextrose, fluticasone, heparin, heparin, hydrALAZINE, hydrOXYzine, lidocaine (PF), lidocaine-prilocaine, ondansetron **OR** ondansetron (ZOFRAN) IV, mouth rinse, pentafluoroprop-tetrafluoroeth, promethazine (PHENERGAN) injection (IM or IVPB), simethicone  Antibiotics  :    Anti-infectives (From admission, onward)    None         Objective:   Vitals:   10/26/22 2000 10/27/22 0000 10/27/22 0300 10/27/22 0736  BP: (!) 122/53 (!) 115/54 (!) 109/53 (!) 107/49  Pulse: 62 62 60 61  Resp: '14 15 15 19  '$ Temp: 98.1 F (36.7 C) 98 F (36.7 C) 98.1 F (36.7 C) 97.7 F (36.5 C)  TempSrc: Oral Oral Oral Axillary  SpO2: 97% 97% 96% 96%  Weight:      Height:        Wt Readings from Last 3 Encounters:  10/26/22 67.4 kg  10/19/22 70.8 kg  09/29/22 70.8 kg     Intake/Output Summary (Last 24 hours) at 10/27/2022 0956 Last data filed at 10/26/2022 1021 Gross per 24 hour  Intake 3 ml  Output --  Net 3 ml     Physical Exam  Somnolent after she received some Ativan this morning, no focal deficits, [place].AT,PERRAL Supple Neck, No JVD,   Symmetrical Chest wall movement, Good air movement bilaterally, CTAB RRR,No Gallops, Rubs or new Murmurs,  +ve B.Sounds, Abd Soft, No tenderness,   No Cyanosis, Clubbing or edema    RN pressure injury documentation: Pressure Injury 10/25/22 Sacrum Mid Stage 1 -  Intact skin with non-blanchable redness of a localized area usually over a bony prominence. rednes noted (Active)  10/25/22 1445  Location: Sacrum  Location Orientation: Mid  Staging: Stage 1 -  Intact skin with non-blanchable redness of a localized area usually over a bony prominence.  Wound Description (Comments): rednes noted  Present on Admission:  Yes  Dressing Type Foam - Lift dressing to assess site every shift 10/26/22 2200      Data Review:    Recent Labs  Lab 10/24/22 0404 10/25/22 0011 10/25/22 0628 10/26/22 0834 10/27/22 0253  WBC 6.7 6.2  --  4.4 3.9*  HGB 7.3* 9.1*  --  8.4* 8.2*  HCT 24.7* 29.3*  --  27.0* 27.7*  PLT 118* 98* 93* 83* 80*  MCV 92.2 91.0  --  90.9 93.3  MCH 27.2 28.3  --  28.3 27.6  MCHC 29.6* 31.1  --  31.1 29.6*  RDW 19.7* 18.3*  --  18.3* 18.1*  LYMPHSABS 0.5*  --   --  0.5* 0.5*  MONOABS 0.3  --   --  0.3 0.2  EOSABS 0.5  --   --  0.2 0.3  BASOSABS 0.0  --   --  0.0 0.0    Recent Labs  Lab 10/24/22 0404  10/24/22 1042 10/25/22 0011 10/25/22 0628 10/26/22 0834 10/27/22 0253  NA 135 133* 134*  --  133* 135  K 3.8 3.8 3.6  --  3.9 4.1  CL 96* 96* 97*  --  99 100  CO2 '27 26 25  '$ --  26 27  ANIONGAP '12 11 12  '$ --  8 8  GLUCOSE 82 88 95  --  90 66*  BUN 34* 35* 14  --  24* 24*  CREATININE 4.74* 4.87* 2.49*  --  3.49* 3.84*  AST 7*  --   --   --   --   --   ALT 5  --   --   --   --   --   ALKPHOS 96  --   --   --   --   --   BILITOT 0.8  --   --   --   --   --   ALBUMIN 2.7* 2.7* 2.8*  --  2.4* 2.4*  CRP  --  5.2*  --   --   --   --   DDIMER  --   --   --  2.17*  --   --   INR  --   --   --  1.1  --   --   TSH  --  2.455  --   --   --   --   BNP  --   --   --   --  741.8* 411.3*  MG 1.7  --   --   --  1.6* 2.2  CALCIUM 7.8* 7.7* 7.6*  --  8.0* 8.1*       Radiology Reports MR BRAIN WO CONTRAST  Result Date: 10/26/2022 CLINICAL DATA:  Provided history: Seizure, new onset, no history of trauma. EXAM: MRI HEAD WITHOUT CONTRAST TECHNIQUE: Multiplanar, multiecho pulse sequences of the brain and surrounding structures were obtained without intravenous contrast. COMPARISON:  CT 10/24/2022. Brain MRI 07/21/2022. FINDINGS: Intermittently motion degraded examination. Most notably, there is moderate-to-severe motion degradation of the least motion degraded axial T2 sequence, and  moderate-to-severe motion degradation of the coronal T2 sequence through the hippocampi. Within this limitation, findings are as follows: Brain: No age advanced or lobar predominant parenchymal atrophy. Redemonstrated small focus of encephalomalacia within the left parietal lobe (with involvement of the postcentral gyrus) consistent with a chronic infarct (series 8, image 17). No significant cerebral white matter disease for age. No appreciable hippocampal size or signal asymmetry. There is no acute infarct. No evidence of an intracranial mass. No extra-axial fluid collection. No midline shift. Vascular: Maintained flow voids within the proximal large arterial vessels. Skull and upper cervical spine: No focal suspicious marrow lesion. Incompletely assessed cervical spondylosis. Ligamentous hypertrophy/pannus formation about the C1-C2 articulation. Sinuses/Orbits: No mass or acute finding within the imaged orbits. Prior bilateral ocular lens replacement. Minimal mucosal thickening within the bilateral sphenoid sinuses. Other: Incompletely imaged 2.3 cm T2 hyperintense and T1 hypointense subcutaneous mass/lesion within the left lower neck/left shoulder soft tissues (for instance as seen on series 12, image 2) (series 14, image 16). Corresponding restricted diffusion at this site. IMPRESSION: 1. Motion degraded examination. 2. No evidence of acute intracranial abnormality. 3. 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 4. Incompletely imaged and incompletely assessed 2.3 cm ovoid subcutaneous mass/lesion within the left lower neck/left shoulder soft tissues. A neck CT is recommended for further evaluation. Electronically Signed   By: Kellie Simmering D.O.  On: 10/26/2022 12:28   EEG adult  Result Date: 10/25/2022 Lora Havens, MD     10/25/2022  2:02 PM Patient Name: HILDRED PHARO MRN: [id number] Epilepsy Attending: Lora Havens Referring Physician/Provider: Ripley Fraise,  MD Date: 10/25/2022 Duration: 24.06 mins Patient history: 77 year old female with seizure-like episodes.  EEG to evaluate for seizure. Level of alertness: Awake, asleep AEDs during EEG study: LEV, versed Technical aspects: This EEG study was done with scalp electrodes positioned according to the 10-20 International system of electrode placement. Electrical activity was reviewed with band pass filter of 1-'70Hz'$ , sensitivity of 7 uV/mm, display speed of 2m/sec with a '60Hz'$  notched filter applied as appropriate. EEG data were recorded continuously and digitally stored.  Video monitoring was available and reviewed as appropriate. Description: During awake state, no clear posterior dominant rhythm was seen.  EEG showed continuous generalized predominantly 5 to 7 Hz theta slowing admixed with 2 to 3 Hz generalized delta slowing.  Sleep was characterized by vertex waves, sleep spindles (12 to 14 Hz), maximal frontocentral region. Hyperventilation and photic stimulation were not performed.   ABNORMALITY - Continuous slow, generalized IMPRESSION: This study is suggestive of moderate diffuse encephalopathy, nonspecific etiology.  No seizures or epileptiform discharges were seen throughout the recording. Priyanka O Yadav   VAS [place]OWER EXTREMITY VENOUS (DVT)  Result Date: 10/24/2022  Lower Venous DVT Study Patient Name:  RGEORGEANN BRINKMAN Date of Exam:   10/24/2022 Medical Rec #: [id number]        Accession #:    [id number] of Birth: 511-12-1945         Patient Gender: F Patient Age:   [age]years [facility]      VAS [place]OWER EXTREMITY VENOUS (DVT) Referring Phys: RFuller Plan--------------------------------------------------------------------------------  Indications: Swelling.  Comparison Study: Prior negative bilateral LEV done 07/22/22 Performing Technologist: CSharion DoveRVS  Examination Guidelines: A complete evaluation includes B-mode imaging, spectral Doppler, color Doppler,  and power Doppler as needed of all accessible portions of each vessel. Bilateral testing is considered an integral part of a complete examination. Limited examinations for reoccurring indications may be performed as noted. The reflux portion of the exam is performed with the patient in reverse Trendelenburg.  +---------+---------------+---------+-----------+----------+-------------------+ RIGHT    CompressibilityPhasicitySpontaneityPropertiesThrombus Aging      +---------+---------------+---------+-----------+----------+-------------------+ CFV      Full           Yes      Yes                                      +---------+---------------+---------+-----------+----------+-------------------+ SFJ      Full                                                             +---------+---------------+---------+-----------+----------+-------------------+ FV Prox  Full                                                             +---------+---------------+---------+-----------+----------+-------------------+  FV Mid   Full                                                             +---------+---------------+---------+-----------+----------+-------------------+ FV DistalFull                                                             +---------+---------------+---------+-----------+----------+-------------------+ PFV      Full                                                             +---------+---------------+---------+-----------+----------+-------------------+ POP      Full           Yes      Yes                                      +---------+---------------+---------+-----------+----------+-------------------+ PTV                                                   Not well visualized +---------+---------------+---------+-----------+----------+-------------------+ PERO                                                  Not well visualized  +---------+---------------+---------+-----------+----------+-------------------+   +---------+---------------+---------+-----------+----------+--------------+ LEFT     CompressibilityPhasicitySpontaneityPropertiesThrombus Aging +---------+---------------+---------+-----------+----------+--------------+ CFV      Full           Yes      Yes                                 +---------+---------------+---------+-----------+----------+--------------+ SFJ      Full                                                        +---------+---------------+---------+-----------+----------+--------------+ FV Prox  Full                                                        +---------+---------------+---------+-----------+----------+--------------+ FV Mid   Full                                                        +---------+---------------+---------+-----------+----------+--------------+  FV DistalFull                                                        +---------+---------------+---------+-----------+----------+--------------+ PFV      Full                                                        +---------+---------------+---------+-----------+----------+--------------+ POP      Full           Yes      Yes                                 +---------+---------------+---------+-----------+----------+--------------+ PTV      Full                                                        +---------+---------------+---------+-----------+----------+--------------+ PERO     Full                                                        +---------+---------------+---------+-----------+----------+--------------+     Summary: BILATERAL: - No evidence of deep vein thrombosis seen in the lower extremities, bilaterally. - RIGHT: - No cystic structure found in the popliteal fossa.  LEFT: - No cystic structure found in the popliteal fossa.  *See table(s) above for measurements and  observations. Electronically signed by Orlie Pollen on 10/24/2022 at 6:47:57 PM.    Final    CT Head Wo Contrast  Result Date: 10/24/2022 CLINICAL DATA:  77 year old female with jerking, shaking. EXAM: CT HEAD WITHOUT CONTRAST TECHNIQUE: Contiguous axial images were obtained from the base of the skull through the vertex without intravenous contrast. RADIATION DOSE REDUCTION: This exam was performed according to the departmental dose-optimization program which includes automated exposure control, adjustment of the mA and/or kV according to patient size and/or use of iterative reconstruction technique. COMPARISON:  Brain MRI 07/21/2022.  Head CT 07/03/2020. FINDINGS: Brain: Cerebral volume is not significantly changed since 2021, within normal limits for age. 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 No midline shift, ventriculomegaly, mass effect, evidence of mass lesion, intracranial hemorrhage or evidence of cortically based acute infarction. Gray-white matter differentiation is stable and within normal limits for age. Vascular: Calcified atherosclerosis at the skull base. No suspicious intracranial vascular hyperdensity. Skull: No acute osseous abnormality identified. Sinuses/Orbits: Visualized paranasal sinuses and mastoids are stable and well aerated. Other: No acute orbit or scalp soft tissue finding. IMPRESSION: Stable and normal for age non contrast CT appearance of the brain. Electronically Signed   By: Genevie Ann M.D.   On: 10/24/2022 05:18   DG Chest Port 1 View  Result Date: 10/24/2022 CLINICAL DATA:  77 year old female missed dialysis. EXAM: PORTABLE CHEST 1 VIEW COMPARISON:  Portable  chest 10/19/2022 and earlier. FINDINGS: Portable AP semi upright view at 0400 hours. Stable lung volumes. Stable cardiac size and mediastinal contours. On going left lung base hypo ventilation which appears in part related to small pleural effusion. Increased right  lung and lung base veiling opacity and blunting of the right costophrenic angle. No superimposed pneumothorax, pulmonary edema, or consolidation. Paucity of bowel gas in the visible abdomen. Stable visualized osseous structures. Both glenohumeral joints are chronically dislocated since at least 07/21/2022. IMPRESSION: 1. Small bilateral pleural effusions, increased on the right since 10/19/2022. 2. No other acute cardiopulmonary abnormality. 3. Chronic bilateral glenohumeral joint dislocations. Electronically Signed   By: Genevie Ann M.D.   On: 10/24/2022 04:31      Signature  -   Lala Lund M.D on 10/27/2022 at 9:56 AM   -  To page go to [email or web address]

## 2022-10-27 NOTE — Progress Notes (Signed)
[facility] Progress Note   Subjective:  Seizure during dialysis yesterday - neuro following- EEG/MRI done.  Seen in room - more alert this am. Doesn't recall any history of seizures.   Objective Vitals:   10/26/22 2000 10/27/22 0000 10/27/22 0300 10/27/22 0736  BP: (!) 122/53 (!) 115/54 (!) 109/53 (!) 107/49  Pulse: 62 62 60 61  Resp: '14 15 15 19  '$ Temp: 98.1 F (36.7 C) 98 F (36.7 C) 98.1 F (36.7 C) 97.7 F (36.5 C)  TempSrc: Oral Oral Oral Axillary  SpO2: 97% 97% 96% 96%  Weight:      Height:          Additional Objective Labs: Basic Metabolic Panel: Recent Labs  Lab 10/25/22 0011 10/26/22 0834 10/27/22 0253  NA 134* 133* 135  K 3.6 3.9 4.1  CL 97* 99 100  CO2 '25 26 27  '$ GLUCOSE 95 90 66*  BUN 14 24* 24*  CREATININE 2.49* 3.49* 3.84*  CALCIUM 7.6* 8.0* 8.1*  PHOS 2.3* 3.2 3.9    CBC: Recent Labs  Lab 10/24/22 0404 10/25/22 0011 10/25/22 0628 10/26/22 0834 10/27/22 0253  WBC 6.7 6.2  --  4.4 3.9*  NEUTROABS 5.3  --   --  3.4 2.8  HGB 7.3* 9.1*  --  8.4* 8.2*  HCT 24.7* 29.3*  --  27.0* 27.7*  MCV 92.2 91.0  --  90.9 93.3  PLT 118* 98* 93* 83* 80*    Blood Culture    Component Value Date/Time   SDES BLOOD LEFT FOREARM 09/29/2022 1716   SPECREQUEST  09/29/2022 1716    BOTTLES DRAWN AEROBIC AND ANAEROBIC Blood Culture adequate volume   CULT  09/29/2022 1716    NO GROWTH 5 DAYS Performed at [facility], [place] [address] [place], [place] [address]    REPTSTATUS 10/04/2022 FINAL 09/29/2022 1716     Physical Exam General: in HD Heart: RRR  Lungs: Clear, upper airway noise Abdomen: soft non-tender  Extremities: No sig LE edema  Dialysis Access: R AVF +bruit  Neuro: stereotypical neck and facial twitching noted, RRT called, IV ativan given  Medications:  anticoagulant sodium citrate     levETIRAcetam 500 mg (10/26/22 2218)   promethazine (PHENERGAN) injection (IM or IVPB)      sodium chloride   Intravenous Once    atorvastatin  20 mg Oral Daily   buprenorphine  1 patch Transdermal Weekly   calcitRIOL  1.25 mcg Oral Q M,W,F-HD   camphor-menthol  1 Application Topical Daily   carbidopa-levodopa  2 tablet Oral q AM   And   carbidopa-levodopa  1 tablet Oral BID   Chlorhexidine Gluconate Cloth  6 each Topical Q0600   gabapentin  300 mg Oral QHS   insulin aspart  0-5 Units Subcutaneous QHS   insulin aspart  0-6 Units Subcutaneous TID WC   melatonin  5 mg Oral QHS   midodrine  20 mg Oral Q M,W,F-HD   mirtazapine  15 mg Oral QHS   nystatin  1 Application Topical Daily   pantoprazole  40 mg Oral BID   rOPINIRole  12 mg Oral Daily   sodium chloride flush  3 mL Intravenous Q12H   tamsulosin  0.4 mg Oral QHS    Dialysis Orders: NW MWF 3:15h  67.9 kg 3K/2Ca  AVF Hep 3000  Assessment/Plan: 1 Myoclonus/ jerking: initially thought possibly gabapentin toxicity- CT head negative.  Elytes OK.  Stopped gabapentin. Now I'm wondering if all related to #2  MRI  - no acute intracranial findings, incomplete image mass/lesion in left neck  2 Seizure:  First seizure 1/16, endo cancelled, loaded with Keppra, neuro following.  Another seizure in dialysis today- Stopped with 2 mg IV ativan.  EEG negative 3 ESRD: MWF [facility], procedure terminated d/t seizure activity 1/17--> will try again today 1/18.   4 Hypertension: BP/volume ok.  meds at home 5. Anemia of ESRD:  s/p 1 u pRBCs 1/15 will give ESA as appropriate.  Gi consulted, for endo but procedure cancelled yesterday d/t seizure. 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  Dispo: admitted  Jean Child PA-C [facility] 10/27/2022,9:47 AM

## 2022-10-27 NOTE — Progress Notes (Signed)
No LTM maint required. Patient DC for MRI and needed to be reconnected to EEG placed study back online.  Tested Event button.

## 2022-10-27 NOTE — Procedures (Addendum)
Patient Name: Jean Mcclain  MRN: [id number]  Epilepsy Attending: Lora Havens  Referring Physician/Provider: Lora Havens, MD  Duration: 10/26/2022 1318 to 10/27/2022 1318   Patient history: 77 year old female with seizure-like episodes.  EEG to evaluate for seizure.   Level of alertness: Awake, asleep   AEDs during EEG study: LEV   Technical aspects: This EEG study was done with scalp electrodes positioned according to the 10-20 International system of electrode placement. Electrical activity was reviewed with band pass filter of 1-'70Hz'$ , sensitivity of 7 uV/mm, display speed of 50m/sec with a '60Hz'$  notched filter applied as appropriate. EEG data were recorded continuously and digitally stored.  Video monitoring was available and reviewed as appropriate.   Description: The posterior dominant rhythm consists of '8Hz'$  activity of moderate voltage (25-35 uV) seen predominantly in posterior head regions, symmetric and reactive to eye opening and eye closing. Sleep was characterized by vertex waves, sleep spindles (12 to 14 Hz), maximal frontocentral region. EEG showed intermittent generalized predominantly 5 to 7 Hz theta slowing admixed with 2 to 3 Hz generalized delta slowing. Hyperventilation and photic stimulation were not performed.      ABNORMALITY -Intermittent slow, generalized   IMPRESSION: This study is suggestive of mild to moderate diffuse encephalopathy, nonspecific etiology.  No seizures or epileptiform discharges were seen throughout the recording.   Raushanah Osmundson [name]

## 2022-10-27 NOTE — Progress Notes (Incomplete)
Pt had seizure like activity this morning. Jennette Kettle, DO and

## 2022-10-27 NOTE — Progress Notes (Incomplete)
Received patient in bed to unit.  Alert and oriented.  Informed consent signed and in chart.   Treatment initiated: 1309 Treatment completed: 1706   Patient tolerated well.  Transported back to the room  Alert, without acute distress.  Hand-off given to patient's nurse.   Access used: *** Access issues: ***  Total UF removed: *** Medication(s) given: *** Post HD VS: *** Post HD weight: ***   [facility]

## 2022-10-27 NOTE — Progress Notes (Signed)
OT Cancellation Note  Patient Details Name: Jean Mcclain MRN: [id number] DOB: 1946-08-15   Cancelled Treatment:    Reason Eval/Treat Not Completed: Medical issues which prohibited therapy.  Patient not alert and lethargic post seizure 1/17, OT to continue efforts as appropriate.    Brynja Marker D Joell Buerger 10/27/2022, 11:16 AM 10/27/2022  RP, OTR/L  Acute Rehabilitation Services  Office:  [phone]

## 2022-10-28 DIAGNOSIS — Z8673 Personal history of transient ischemic attack (TIA), and cerebral infarction without residual deficits: Secondary | ICD-10-CM | POA: Diagnosis not present

## 2022-10-28 DIAGNOSIS — R110 Nausea: Secondary | ICD-10-CM | POA: Diagnosis not present

## 2022-10-28 DIAGNOSIS — R569 Unspecified convulsions: Secondary | ICD-10-CM | POA: Diagnosis not present

## 2022-10-28 DIAGNOSIS — D649 Anemia, unspecified: Secondary | ICD-10-CM | POA: Diagnosis not present

## 2022-10-28 DIAGNOSIS — R933 Abnormal findings on diagnostic imaging of other parts of digestive tract: Secondary | ICD-10-CM | POA: Diagnosis not present

## 2022-10-28 LAB — CBC WITH DIFFERENTIAL/PLATELET
Abs Immature Granulocytes: 0.02 10*3/uL (ref 0.00–0.07)
Basophils Absolute: 0 10*3/uL (ref 0.0–0.1)
Basophils Relative: 1 %
Eosinophils Absolute: 0.2 10*3/uL (ref 0.0–0.5)
Eosinophils Relative: 5 %
HCT: 26.7 % — ABNORMAL LOW (ref 36.0–46.0)
Hemoglobin: 8.4 g/dL — ABNORMAL LOW (ref 12.0–15.0)
Immature Granulocytes: 1 %
Lymphocytes Relative: 17 %
Lymphs Abs: 0.7 10*3/uL (ref 0.7–4.0)
MCH: 28.4 pg (ref 26.0–34.0)
MCHC: 31.5 g/dL (ref 30.0–36.0)
MCV: 90.2 fL (ref 80.0–100.0)
Monocytes Absolute: 0.3 10*3/uL (ref 0.1–1.0)
Monocytes Relative: 6 %
Neutro Abs: 3 10*3/uL (ref 1.7–7.7)
Neutrophils Relative %: 70 %
Platelets: 69 10*3/uL — ABNORMAL LOW (ref 150–400)
RBC: 2.96 MIL/uL — ABNORMAL LOW (ref 3.87–5.11)
RDW: 17.9 % — ABNORMAL HIGH (ref 11.5–15.5)
WBC: 4.2 10*3/uL (ref 4.0–10.5)
nRBC: 0 % (ref 0.0–0.2)

## 2022-10-28 LAB — RENAL FUNCTION PANEL
Albumin: 2.4 g/dL — ABNORMAL LOW (ref 3.5–5.0)
Anion gap: 9 (ref 5–15)
BUN: 16 mg/dL (ref 8–23)
CO2: 28 mmol/L (ref 22–32)
Calcium: 7.8 mg/dL — ABNORMAL LOW (ref 8.9–10.3)
Chloride: 97 mmol/L — ABNORMAL LOW (ref 98–111)
Creatinine, Ser: 2.94 mg/dL — ABNORMAL HIGH (ref 0.44–1.00)
GFR, Estimated: 16 mL/min — ABNORMAL LOW (ref >60)
Glucose, Bld: 66 mg/dL — ABNORMAL LOW (ref 70–99)
Phosphorus: 2.7 mg/dL (ref 2.5–4.6)
Potassium: 3.8 mmol/L (ref 3.5–5.1)
Sodium: 134 mmol/L — ABNORMAL LOW (ref 135–145)

## 2022-10-28 LAB — GLUCOSE, CAPILLARY
Glucose-Capillary: 85 mg/dL (ref 70–99)
Glucose-Capillary: 99 mg/dL (ref 70–99)
Glucose-Capillary: 80 mg/dL (ref 70–99)
Glucose-Capillary: 79 mg/dL (ref 70–99)

## 2022-10-28 LAB — BRAIN NATRIURETIC PEPTIDE: B Natriuretic Peptide: 173.6 pg/mL — ABNORMAL HIGH (ref 0.0–100.0)

## 2022-10-28 LAB — MAGNESIUM: Magnesium: 1.8 mg/dL (ref 1.7–2.4)

## 2022-10-28 MED ORDER — DARBEPOETIN ALFA 200 MCG/0.4ML IJ SOSY
200.0000 ug | PREFILLED_SYRINGE | INTRAMUSCULAR | Status: DC
  Administered 2022-10-28 – 2022-11-04 (×2): 200 ug via SUBCUTANEOUS
  Filled 2022-10-28 (×3): qty 0.4

## 2022-10-28 NOTE — Progress Notes (Signed)
Progress Note   Subjective  Patient has been doing better from Neurology standpoint, no seizures last 24 hours. She is trying a diet today. Denies pain   Objective   Vital signs in last 24 hours: Temp:  [97.5 F (36.4 C)-97.8 F (36.6 C)] 97.7 F (36.5 C) (01/19 0300) Pulse Rate:  [32-73] 59 (01/19 0800) Resp:  [11-23] 17 (01/19 0800) BP: (88-148)/(46-87) 127/83 (01/19 0800) SpO2:  [79 %-100 %] 99 % (01/19 0800) Weight:  [67.2 kg-70.9 kg] 67.2 kg (01/18 1710) Last BM Date : 10/25/22 General:    white female in NAD Neurologic:  Alert and oriented,  grossly normal neurologically. Psych:  Cooperative. Normal mood and affect.  Intake/Output from previous day: 01/18 0701 - 01/19 0700 In: 80 [P.O.:30] Out: 1400  Intake/Output this shift: No intake/output data recorded.  Lab Results: Recent Labs    10/26/22 0834 10/27/22 0253 10/28/22 0507  WBC 4.4 3.9* 4.2  HGB 8.4* 8.2* 8.4*  HCT 27.0* 27.7* 26.7*  PLT 83* 80* 69*   BMET Recent Labs    10/26/22 0834 10/27/22 0253 10/28/22 0507  NA 133* 135 134*  K 3.9 4.1 3.8  CL 99 100 97*  CO2 '26 27 28  '$ GLUCOSE 90 66* 66*  BUN 24* 24* 16  CREATININE 3.49* 3.84* 2.94*  CALCIUM 8.0* 8.1* 7.8*   LFT Recent Labs    10/28/22 0507  ALBUMIN 2.4*   PT/INR No results for input(s): "LABPROT", "INR" in the last 72 hours.  Studies/Results: CT SOFT TISSUE NECK W CONTRAST  Result Date: 10/27/2022 CLINICAL DATA:  Neck mass, non pulsatile, present for several years and seen by preceding brain MRI EXAM: CT NECK WITH CONTRAST TECHNIQUE: Multidetector CT imaging of the neck was performed using the standard protocol following the bolus administration of intravenous contrast. RADIATION DOSE REDUCTION: This exam was performed according to the departmental dose-optimization program which includes automated exposure control, adjustment of the mA and/or kV according to patient size and/or use of iterative reconstruction technique.  CONTRAST:  54m OMNIPAQUE IOHEXOL 350 MG/ML SOLN COMPARISON:  None Available. FINDINGS: Pharynx and larynx: No evidence of mass or inflammation. Salivary glands: Normal Thyroid: No significant finding Lymph nodes: No worrisome lymph node enlargement or heterogeneity. Vascular: Partial retropharyngeal course. Mild atheromatous calcification for age and end-stage renal disease Limited intracranial: Negative Visualized orbits: Not covered Mastoids and visualized paranasal sinuses: Clear Skeleton: Cervical spine degeneration with confluent osteophytes in the thoracic spine. Advanced C1-2 facet and left shoulder degeneration. Upper chest: Head sizable layering pleural effusions with atelectasis. Other: The finding of concern by prior brain MRI is a subcutaneous homogeneous mass measuring 2.7 cm in the left supraclavicular neck, most consistent with dermal inclusion cyst or other benign process, appearance stable from a thoracic spine CT 01/09/2022 IMPRESSION: 1. The subcutaneous left neck mass by MRI has benign and stable CT features most consistent with dermal inclusion cyst or other benign process. 2. Sizable layering pleural effusions with atelectasis. Electronically Signed   By: JJorje GuildM.D.   On: 10/27/2022 12:04   Overnight EEG with video  Result Date: 10/27/2022 YLora Havens MD     10/28/2022  8:49 AM Patient Name: Jean BLACKLEYMRN: [id number] Attending: PLora HavensReferring Physician/Provider: YLora Havens MD Duration: 10/26/2022 1318 to 10/27/2022 1318  Patient history: 77year old female with seizure-like episodes.  EEG to evaluate for seizure.  Level of alertness: Awake, asleep  AEDs during EEG study: LEV  Technical  aspects: This EEG study was done with scalp electrodes positioned according to the 10-20 International system of electrode placement. Electrical activity was reviewed with band pass filter of 1-'70Hz'$ , sensitivity of 7 uV/mm, display speed of 23m/sec with a  '60Hz'$  notched filter applied as appropriate. EEG data were recorded continuously and digitally stored.  Video monitoring was available and reviewed as appropriate.  Description: The posterior dominant rhythm consists of '8Hz'$  activity of moderate voltage (25-35 uV) seen predominantly in posterior head regions, symmetric and reactive to eye opening and eye closing. Sleep was characterized by vertex waves, sleep spindles (12 to 14 Hz), maximal frontocentral region. EEG showed intermittent generalized predominantly 5 to 7 Hz theta slowing admixed with 2 to 3 Hz generalized delta slowing. Hyperventilation and photic stimulation were not performed.    ABNORMALITY -Intermittent slow, generalized  IMPRESSION: This study is suggestive of mild to moderate diffuse encephalopathy, nonspecific etiology.  No seizures or epileptiform discharges were seen throughout the recording.  PLora Havens  MR BRAIN WO CONTRAST  Result Date: 10/26/2022 CLINICAL DATA:  Provided history: Seizure, new onset, no history of trauma. EXAM: MRI HEAD WITHOUT CONTRAST TECHNIQUE: Multiplanar, multiecho pulse sequences of the brain and surrounding structures were obtained without intravenous contrast. COMPARISON:  CT 10/24/2022. Brain MRI 07/21/2022. FINDINGS: Intermittently motion degraded examination. Most notably, there is moderate-to-severe motion degradation of the least motion degraded axial T2 sequence, and moderate-to-severe motion degradation of the coronal T2 sequence through the hippocampi. Within this limitation, findings are as follows: Brain: No age advanced or lobar predominant parenchymal atrophy. Redemonstrated small focus of encephalomalacia within the left parietal lobe (with involvement of the postcentral gyrus) consistent with a chronic infarct (series 8, image 17). No significant cerebral white matter disease for age. No appreciable hippocampal size or signal asymmetry. There is no acute infarct. No evidence of an intracranial  mass. No extra-axial fluid collection. No midline shift. Vascular: Maintained flow voids within the proximal large arterial vessels. Skull and upper cervical spine: No focal suspicious marrow lesion. Incompletely assessed cervical spondylosis. Ligamentous hypertrophy/pannus formation about the C1-C2 articulation. Sinuses/Orbits: No mass or acute finding within the imaged orbits. Prior bilateral ocular lens replacement. Minimal mucosal thickening within the bilateral sphenoid sinuses. Other: Incompletely imaged 2.3 cm T2 hyperintense and T1 hypointense subcutaneous mass/lesion within the left lower neck/left shoulder soft tissues (for instance as seen on series 12, image 2) (series 14, image 16). Corresponding restricted diffusion at this site. IMPRESSION: 1. Motion degraded examination. 2. No evidence of acute intracranial abnormality. 3. 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 4. Incompletely imaged and incompletely assessed 2.3 cm ovoid subcutaneous mass/lesion within the left lower neck/left shoulder soft tissues. A neck CT is recommended for further evaluation. Electronically Signed   By: KKellie SimmeringD.O.   On: 10/26/2022 12:28       Assessment / Plan:    77y/o female with multiple medical problems, end-stage renal disease on HD, recurrent diverticulitis, Parkinson's, history of DVT on Eliquis, admitted with the following:   Refractory nausea / vomiting Abnormal CT scan - persistent diverticulitis of left colon Seizure like activity   Recall we previously attempted to do upper endoscopy to evaluate her nausea vomiting first, however had seizure in the preprocedure area and this was canceled.  She has had recurrent seizure-like activity since then, followed by neurology, doing better in the past 24 hours.  Most important test for her as a colonoscopy or flex sig to evaluate her  left colon and clarify prior CT changes and rule out a mass lesion.  Question is  whether or not she will tolerate a bowel prep.  She is going to resume a diet today and we will see how she does with this.  If she does not do well and continues to have nausea and vomiting then we will plan for EGD and flex sig on Monday.  If she can tolerate a diet and possible bowel prep, may consider colonoscopy on Monday.  Will give her a few more days over the weekend to make sure she is stable from a neurologic standpoint.  Perhaps her nausea was more CNS related in light of her recurrent seizure-like activity.  I have discussed her case with primary team and neurology, they feel stable for anesthesia in a few days if otherwise stable from a neurologic standpoint.   PLAN: - resume diet today - monitoring for recurrent seizure like activity - tentatively schedule for EGD and colonoscopy Monday if she is otherwise stable over the weekend. If she cannot tolerate pre, would do EGD and flex sig - we will reassess her on Sunday to determine what procedures we will do Monday  Call with questions in the interim  Jolly Mango, MD [facility]

## 2022-10-28 NOTE — Progress Notes (Addendum)
Name: Jean Mcclain DOB: [birth year] DOA: 10/24/2022 PCP: No assigned PCP CC: Body shaking  Subjective:  Overnight:NAEON  States doing well. Denies any acute concerns.   Objective:  Vital signs in last 24 hours: Vitals:   10/27/22 1947 10/27/22 2014 10/27/22 2300 10/28/22 0300  BP: (!) 130/55  (!) 112/48 (!) 115/52  Pulse: 60  60 64  Resp: '12  16 18  '$ Temp: 97.8 F (36.6 C)  97.8 F (36.6 C) 97.7 F (36.5 C)  TempSrc: Axillary  Axillary Oral  SpO2:  95% 94% 100%  Weight:      Height:       Supplemental O2: Nasal Cannula Last BM Date : 10/25/22 SpO2: 100 % O2 Flow Rate (L/min): 3 L/min Filed Weights   10/26/22 0820 10/27/22 1245 10/27/22 1710  Weight: 67.4 kg 70.9 kg 67.2 kg    Intake/Output Summary (Last 24 hours) at 10/28/2022 0728 Last data filed at 10/27/2022 1710 Gross per 24 hour  Intake 80 ml  Output 1400 ml  Net -1320 ml   Net IO Since Admission: -2,557.75 mL [10/28/22 0728] Physical Exam General: NAD, laying comfortably in bed.  HENT: EEG leads in place. NCAT  Lungs:  CTAB Cardiovascular: NSR, good pulses Abdomen: normal bowel sounds MSK: No asymmetry. moving all extremities Neuro: alert and oriented Psych: normal mood and normal affect  Diagnostics    Latest Ref Rng & Units 10/28/2022    5:07 AM 10/27/2022    2:53 AM 10/26/2022    8:34 AM  CBC  WBC 4.0 - 10.5 K/uL 4.2  3.9  4.4   Hemoglobin 12.0 - 15.0 g/dL 8.4  8.2  8.4   Hematocrit 36.0 - 46.0 % 26.7  27.7  27.0   Platelets 150 - 400 K/uL 69  80  83        Latest Ref Rng & Units 10/28/2022    5:07 AM 10/27/2022    2:53 AM 10/26/2022    8:34 AM  CMP  Glucose 70 - 99 mg/dL 66  66  90   BUN 8 - 23 mg/dL '16  24  24   '$ Creatinine 0.44 - 1.00 mg/dL 2.94  3.84  3.49   Sodium 135 - 145 mmol/L 134  135  133   Potassium 3.5 - 5.1 mmol/L 3.8  4.1  3.9   Chloride 98 - 111 mmol/L 97  100  99   CO2 22 - 32 mmol/L '28  27  26   '$ Calcium 8.9 - 10.3 mg/dL 7.8  8.1  8.0     Mag 1.8 BNP  173.6     Assessment/Plan: Jean Mcclain is a 77 y.o. female with medical history significant of hypertension, hyperlipidemia, diabetes mellitus type II, ESRD on HD(M/W/F), chronic respiratory failure with hypoxia on 3 L of oxygen at night, Parkinson's disease, history of DVT not on anticoagulation, and wheelchair-bound due to left ankle fracture who presents for worsening shaking and jerking found to have seizures.  Principal Problem:   Symptomatic anemia Active Problems:   Diabetes mellitus due to underlying condition, controlled, with stage 2 chronic kidney disease, with long-term current use of insulin (HCC)   Kidney lesion   ESRD on hemodialysis (HCC)   Acute on chronic respiratory failure with hypoxia (HCC)   History of DVT (deep vein thrombosis)   Acute diverticulitis   Nausea   Jerking   Thrombocytopenia (HCC)   Parkinson's disease   Abnormal TSH   Abnormal CT scan, gastrointestinal tract  Abnormal movements   Anemia  Symptomatic acute on chronic anemia.  Possible occult GI blood loss with recent history of diverticulitis and pending outpatient colonoscopy.   Hgb stable.Workup negative for DIC, LDH stable ruling out any intravascular hemolysis, she is s/p 1 unit of packed RBC transfusion on 10/24/2022 in the ER, she is on PPI, of note she is currently being worked for possible diverticular mass versus persistent diverticulitis and is due for colonoscopy.  Defer further management of GI issues to GI team.  Continue to monitor H&H.  For any antibiotic to the GI team as well.  GI planning EGD and colonoscopy Monday. Abdominal exam is unremarkable.    New onset seizures 2 episodes in the hospital both witnessed. EEG non acute, head CT negative, placed on scheduled Keppra along with as needed Ativan for breakthrough seizure, neuro on board, MRI brain noted with old stroke and some nonspecific neck findings.  Case discussed with neurology on 10/27/2022 continue on Keppra and monitor.  Plan to remove EEG today if no further seizure like activity.    Incidental neck mass noted on MRI. CT soft tissue neck shows mass that is most likely benign and most consistent with dermal inclusion cyst.  -PCP to monitor outpatient  Acute on chronic hypoxic respiratory failure with underlying OSA.  Resolved. At home on 3 L and here on 2-3 L of oxygen.  Likely increased initially due to low hgb.    ESRD on HD.  Patient on MWF schedule.  Completed HD yesterday with 1.4 L output. Nephrology on board.   History of DVT in 2018.  Not on anticoagulation anymore, repeat lower extremity venous ultrasound ordered during admission remains negative.   History of Parkinson's disease.  Continue home regimen.   History of sick euthyroid syndrome.  Stable TSH now.   Mild persistent thrombocytopenia.  Decreased to 69k. Will continue to monitor. No on heparin. DIC panel negative and smear showed no schistocytes. On keppra which can cause thrombocytopenia. Will continue to monitor closely with daily labs.    Kidney lesion - Noted to be increased in size on CT scan from 07/2022 for which MRI of the abdomen recommended in 6 months at that time, PCP to schedule it for April 2024.   DM type II.  A1c is 6.9 in 07/2022 . Currently on sliding scale monitor and adjust. CBG within range.   Diet: Soft Diet IVF: None, VTE: SCDs Code: DNR PT/OT recs: pending Prior to Admission Living Arrangement: ALF Anticipated Discharge Location: TBD likely ALF Barriers to Discharge: Medical stability Dispo: Anticipated discharge in approximately 1-2 day(s).   Idamae Schuller, MD Jean Mcclain. [facility], PGY-2  Pager: [phone] After 5 pm and on weekends: Please call the on-call pager    Attestation  I have directly reviewed the clinical findings, lab results and imaging studies. I have interviewed and examined the patient and agree with the documentation and management as recorded by  Dr.Khan -seizures better controlled on Keppra, case discussed with neurology and GI possible EGD colonoscopy on Monday, continue to monitor H&H.  Mild thrombocytopenia which is stable, will monitor closely, switch PPI to Pepcid, could be due to [place] but will monitor closely no signs of bleeding.  Lala Lund M.D on 10/28/2022 at 10:11 AM  [facility] Office  [phone]

## 2022-10-28 NOTE — Evaluation (Signed)
Physical Therapy Evaluation Patient Details Name: Jean Mcclain MRN: [id number] DOB: 1946/04/22 Today's Date: 10/28/2022  History of Present Illness  77 y/o F presenting to [facility] on 1/15 for reports of shaking. Pt is a resident at [facility]. Pt with a seizure event on 1/16, EEG findings of moderate diffuse encephalopathy. Another seizure event on 1/17, pending further work-up. MRI reveals no evidence of acute intracranial abnormality.  PMHx: ESRD on HD, DM, hx of DVT, Parkinsons, chronic ankle and sacral pain from a fall 2 years ago.  Clinical Impression   Pt presents with generalized weakness, back and sacral pain suspect due to increased time in bed s/p seizures, impaired balance, and decreased activity tolerance vs baseline. Pt to benefit from acute PT to address deficits. Pt requiring mod +1-2 physical assist for transfers this date. Pt states she has assist for all transfers bed<>power chair at ALF, likely appropriate for d/c back to ALF with PT and OT services which pt states she receives. PT to progress mobility as tolerated, and will continue to follow acutely.         Recommendations for follow up therapy are one component of a multi-disciplinary discharge planning process, led by the attending physician.  Recommendations may be updated based on patient status, additional functional criteria and insurance authorization.  Follow Up Recommendations Other (comment) (SNF, vs return to ALF if ALF is able to provide up to mod assist for transfers as well as increased supervision)      Assistance Recommended at Discharge Intermittent Supervision/Assistance  Patient can return home with the following  A little help with walking and/or transfers;A little help with bathing/dressing/bathroom    Equipment Recommendations None recommended by PT  Recommendations for Other Services       Functional Status Assessment Patient has had a recent decline in their functional status and  demonstrates the ability to make significant improvements in function in a reasonable and predictable amount of time.     Precautions / Restrictions Precautions Precautions: Fall Precaution Comments: seizure, watch O2 Restrictions Weight Bearing Restrictions: No      Mobility  Bed Mobility Overal bed mobility: Needs Assistance Bed Mobility: Supine to Sit, Sit to Supine     Supine to sit: Mod assist, +2 for safety/equipment Sit to supine: Mod assist, +2 for safety/equipment   General bed mobility comments: assist for trunk and LE management    Transfers Overall transfer level: Needs assistance Equipment used: 1 person hand held assist, 2 person hand held assist Transfers: Sit to/from Stand, Bed to chair/wheelchair/BSC Sit to Stand: Mod assist, +2 safety/equipment   Step pivot transfers: Mod assist, +2 physical assistance, +2 safety/equipment       General transfer comment: assist for power up, rise, pivot to/from [facility]. +2 physical assist first transfer, +1 for back to bed    Ambulation/Gait                  Stairs            Wheelchair Mobility    Modified Rankin (Stroke Patients Only)       Balance Overall balance assessment: Needs assistance Sitting-balance support: Feet supported, Bilateral upper extremity supported Sitting balance-Leahy Scale: Good     Standing balance support: Bilateral upper extremity supported Standing balance-Leahy Scale: Poor                               Pertinent Vitals/Pain Pain Assessment  Pain Assessment: Faces Faces Pain Scale: Hurts little more Pain Location: back and buttocks Pain Descriptors / Indicators: Grimacing, Tender Pain Intervention(s): Monitored during session, Limited activity within patient's tolerance, Repositioned    Home Living Family/patient expects to be discharged to:: Assisted living                 Home Equipment: Conservation officer, nature (2 wheels);Wheelchair -  power Additional Comments: ALF vs SNF    Prior Function Prior Level of Function : Needs assist             Mobility Comments: performing step pivot transfer with assistance from staff, power chair to dining room ADLs Comments: pt requires assistance with bathing, dressing, toileting     Hand Dominance   Dominant Hand: Right    Extremity/Trunk Assessment   Upper Extremity Assessment Upper Extremity Assessment: Defer to OT evaluation RUE Deficits / Details: decreased end range shoulder flexion RUE Sensation: WNL RUE Coordination: WNL LUE Deficits / Details: decreased end range shoulder flexion LUE Sensation: WNL LUE Coordination: WNL    Lower Extremity Assessment Lower Extremity Assessment: Generalized weakness    Cervical / Trunk Assessment Cervical / Trunk Assessment: Kyphotic  Communication   Communication: No difficulties  Cognition Arousal/Alertness: Awake/alert Behavior During Therapy: WFL for tasks assessed/performed Overall Cognitive Status: Within Functional Limits for tasks assessed                                          General Comments General comments (skin integrity, edema, etc.): SPO2 86% on RA post-transfer back to bed, placed on 2LO2. Urine cloudy and dark yellow in appearance, alerted RN and RN aware    Exercises     Assessment/Plan    PT Assessment Patient needs continued PT services  PT Problem List Decreased strength;Decreased mobility;Decreased activity tolerance;Decreased balance;Decreased knowledge of use of DME;Pain;Decreased safety awareness;Cardiopulmonary status limiting activity       PT Treatment Interventions DME instruction;Therapeutic activities;Gait training;Therapeutic exercise;Patient/family education;Balance training;Stair training;Functional mobility training;Neuromuscular re-education    PT Goals (Current goals can be found in the Care Plan section)  Acute Rehab PT Goals PT Goal Formulation: With  patient Time For Goal Achievement: 11/16/2022 Potential to Achieve Goals: Good    Frequency Min 3X/week     Co-evaluation   Reason for Co-Treatment: For patient/therapist safety;To address functional/ADL transfers PT goals addressed during session: Mobility/safety with mobility;Balance         AM-PAC PT "6 Clicks" Mobility  Outcome Measure Help needed turning from your back to your side while in a flat bed without using bedrails?: A Little Help needed moving from lying on your back to sitting on the side of a flat bed without using bedrails?: A Little Help needed moving to and from a bed to a chair (including a wheelchair)?: A Lot Help needed standing up from a chair using your arms (e.g., wheelchair or bedside chair)?: A Lot Help needed to walk in hospital room?: A Lot Help needed climbing 3-5 steps with a railing? : A Lot 6 Click Score: 14    End of Session Equipment Utilized During Treatment: Oxygen Activity Tolerance: Patient limited by fatigue;Patient limited by pain Patient left: with call bell/phone within reach;in bed;with bed alarm set Nurse Communication: Mobility status PT Visit Diagnosis: Other abnormalities of gait and mobility (R26.89);Muscle weakness (generalized) (M62.81)    Time: 0240-9735 PT Time Calculation (min) (ACUTE ONLY):  38 min   Charges:   PT Evaluation $PT Eval Low Complexity: 1 Low         Righteous Claiborne S, PT DPT Acute Rehabilitation Services Pager [phone]  Office [phone]   Abriana Saltos E Ruffin Pyo 10/28/2022, 11:53 AM

## 2022-10-28 NOTE — Evaluation (Addendum)
Occupational Therapy Evaluation Patient Details Name: Jean Mcclain MRN: [id number] DOB: 1946/03/15 Today's Date: 10/28/2022   History of Present Illness 77 y/o F presenting to [facility] on 1/15 for reports of shaking. Pt is a resident at [facility]. Pt with a seizure event on 1/16, EEG findings of moderate diffuse encephalopathy. Another seizure event on 1/17, pending further work-up. MRI reveals no evidence of acute intracranial abnormality.  PMHx: ESRD on HD, DM, hx of DVT, Parkinsons, chronic ankle and sacral pain from a fall 2 years ago.   Clinical Impression   Patient admitted for the diagnosis above.  PTA she resides in a local ALF, uses a scooter for mobility, and has assist as needed for ADL.  OT will continue to follow to address deficits listed, and assist with eventual transition back to SNF, for rehab trial to ensure baseline function.  If ALF is able to provide needed assist, a return to her facility is appropriate.        Recommendations for follow up therapy are one component of a multi-disciplinary discharge planning process, led by the attending physician.  Recommendations may be updated based on patient status, additional functional criteria and insurance authorization.   Follow Up Recommendations   (ALF vs SNF)     Assistance Recommended at Discharge Frequent or constant Supervision/Assistance  Patient can return home with the following A lot of help with walking and/or transfers;A lot of help with bathing/dressing/bathroom    Functional Status Assessment  Patient has had a recent decline in their functional status and demonstrates the ability to make significant improvements in function in a reasonable and predictable amount of time.  Equipment Recommendations  None recommended by OT    Recommendations for Other Services       Precautions / Restrictions Precautions Precautions: Fall Precaution Comments: seizure, watch O2 Restrictions Weight Bearing  Restrictions: No      Mobility Bed Mobility Overal bed mobility: Needs Assistance Bed Mobility: Supine to Sit, Sit to Supine     Supine to sit: Mod assist Sit to supine: Mod assist     Patient Response: Cooperative  Transfers Overall transfer level: Needs assistance Equipment used: 1 person hand held assist Transfers: Sit to/from Stand, Bed to chair/wheelchair/BSC Sit to Stand: Mod assist     Step pivot transfers: Mod assist     General transfer comment: +2 today for safety and seizures      Balance Overall balance assessment: Needs assistance Sitting-balance support: Feet supported, Bilateral upper extremity supported Sitting balance-Leahy Scale: Good     Standing balance support: Bilateral upper extremity supported Standing balance-Leahy Scale: Poor                             ADL either performed or assessed with clinical judgement   ADL                                         General ADL Comments: assist as needed at baseline     Vision Patient Visual Report: No change from baseline       Perception     Praxis      Pertinent Vitals/Pain Pain Assessment Pain Assessment: Faces Faces Pain Scale: Hurts little more Pain Location: back and buttocks Pain Descriptors / Indicators: Grimacing, Tender Pain Intervention(s): Monitored during session     Hand  Dominance Right   Extremity/Trunk Assessment Upper Extremity Assessment Upper Extremity Assessment: Generalized weakness;RUE deficits/detail;LUE deficits/detail RUE Deficits / Details: decreased end range shoulder flexion RUE Sensation: WNL RUE Coordination: WNL LUE Deficits / Details: decreased end range shoulder flexion LUE Sensation: WNL LUE Coordination: WNL   Lower Extremity Assessment Lower Extremity Assessment: Defer to PT evaluation   Cervical / Trunk Assessment Cervical / Trunk Assessment: Kyphotic   Communication Communication Communication: No  difficulties   Cognition Arousal/Alertness: Awake/alert Behavior During Therapy: WFL for tasks assessed/performed Overall Cognitive Status: Within Functional Limits for tasks assessed                                       General Comments   O2 placed 2L    Exercises     Shoulder Instructions      Home Living Family/patient expects to be discharged to:: Other (Comment)                                 Additional Comments: ALF vs SNF      Prior Functioning/Environment Prior Level of Function : Needs assist             Mobility Comments: performing step pivot transfer with assistance from staff, power chair to dining room ADLs Comments: pt requires assistance with bathing, dressing, toileting        OT Problem List: Decreased strength;Decreased range of motion;Decreased activity tolerance;Impaired balance (sitting and/or standing);Pain      OT Treatment/Interventions: Self-care/ADL training;Therapeutic activities;Balance training;Patient/family education    OT Goals(Current goals can be found in the care plan section) Acute Rehab OT Goals Patient Stated Goal: Return home OT Goal Formulation: With patient Time For Goal Achievement: 11/15/2022 Potential to Achieve Goals: Good ADL Goals Pt Will Perform Grooming: with set-up;sitting Pt Will Transfer to Toilet: with min assist;stand pivot transfer;bedside commode  OT Frequency: Min 2X/week    Co-evaluation              AM-PAC OT "6 Clicks" Daily Activity     Outcome Measure Help from another person eating meals?: A Little Help from another person taking care of personal grooming?: A Little Help from another person toileting, which includes using toliet, bedpan, or urinal?: A Lot Help from another person bathing (including washing, rinsing, drying)?: A Lot Help from another person to put on and taking off regular upper body clothing?: A Lot Help from another person to put on and  taking off regular lower body clothing?: A Lot 6 Click Score: 14   End of Session Equipment Utilized During Treatment: Oxygen Nurse Communication: Mobility status  Activity Tolerance: Patient tolerated treatment well Patient left: in bed;with call bell/phone within reach  OT Visit Diagnosis: Unsteadiness on feet (R26.81)                Time: 0932-6712 OT Time Calculation (min): 38 min Charges:  OT General Charges $OT Visit: 1 Visit OT Evaluation $OT Eval Moderate Complexity: 1 Mod  10/28/2022  RP, OTR/L  Acute Rehabilitation Services  Office:  [phone]   Metta Clines 10/28/2022, 10:38 AM

## 2022-10-28 NOTE — Progress Notes (Signed)
  Transition of Care [facility]) Screening Note   Patient Details  Name: Jean Mcclain Date of Birth: 1945-12-02   Transition of Care [facility]) CM/SW Contact:    Benard Halsted, LCSW Phone Number: 10/28/2022, 3:39 PM    Transition of Care Department [facility]) has reviewed patient from [place]. We will continue to monitor patient advancement through interdisciplinary progression rounds. If new patient transition needs arise, please place a TOC consult.

## 2022-10-28 NOTE — Progress Notes (Addendum)
Subjective: No acute events overnight.  Reports pain in lower back like yesterday.  Denies any other concerns.  Was working with PT/OT  ROS: negative except above  Examination  Vital signs in last 24 hours: Temp:  [97.5 F (36.4 C)-97.8 F (36.6 C)] 97.7 F (36.5 C) (01/19 0300) Pulse Rate:  [32-73] 59 (01/19 0800) Resp:  [11-23] 17 (01/19 0800) BP: (88-148)/(46-87) 127/83 (01/19 0800) SpO2:  [79 %-100 %] 99 % (01/19 0800) Weight:  [67.2 kg-70.9 kg] 67.2 kg (01/18 1710)  General: lying in bed, NAD Neuro: AOx3, no aphasia, cranial nerves II to XII grossly intact, spontaneously moving all 4 extremities with antigravity strength  Basic Metabolic Panel: Recent Labs  Lab 10/24/22 0404 10/24/22 1042 10/25/22 0011 10/26/22 0834 10/27/22 0253 10/28/22 0507  NA 135 133* 134* 133* 135 134*  K 3.8 3.8 3.6 3.9 4.1 3.8  CL 96* 96* 97* 99 100 97*  CO2 '27 26 25 26 27 28  '$ GLUCOSE 82 88 95 90 66* 66*  BUN 34* 35* 14 24* 24* 16  CREATININE 4.74* 4.87* 2.49* 3.49* 3.84* 2.94*  CALCIUM 7.8* 7.7* 7.6* 8.0* 8.1* 7.8*  MG 1.7  --   --  1.6* 2.2 1.8  PHOS 3.8 3.7 2.3* 3.2 3.9 2.7    CBC: Recent Labs  Lab 10/24/22 0404 10/25/22 0011 10/25/22 0628 10/26/22 0834 10/27/22 0253 10/28/22 0507  WBC 6.7 6.2  --  4.4 3.9* 4.2  NEUTROABS 5.3  --   --  3.4 2.8 3.0  HGB 7.3* 9.1*  --  8.4* 8.2* 8.4*  HCT 24.7* 29.3*  --  27.0* 27.7* 26.7*  MCV 92.2 91.0  --  90.9 93.3 90.2  PLT 118* 98* 93* 83* 80* 69*    Coagulation Studies: No results for input(s): "LABPROT", "INR" in the last 72 hours.  Imaging No new brain imaging overnight  ASSESSMENT AND PLAN: 77 year old wheelchair-bound female with history of hypertension, hyperlipidemia, diabetes, end-stage renal disease on hemodialysis parkinsonism, RLS and myoclonus who has been having multiple seizure-like episodes.   New onset seizure Chronic stroke, left parietal Acute encephalopathy, resolved - seizure likely due to underlying stroke.  Didn't get keppra prior to HD.  - stroke etiology: likely cardioembolic   Recommendations - Continue Keppra '500mg'$  BID and '250mg'$  additional dose after HD  - DC LTM eeg - Start ASA '81mg'$  daily and increase atorva '40mg'$  daily for sec stroke prevention. - recommend 30 days event monitor to look for paroxysmal A fib for stroke workup -Plan for endoscopic evaluation on Monday patient remains seizure-free over the weekend.  Please make sure patient gets her AEDs prior to going to the OR.  Discussed plan with the GI team - PTN IV versed for seizure lasting over 5 minutes.  -Continue seizure precautions -Management of rest of comorbidities per primary team -Recommend follow-up with neurology in 3 months -Neurology will sign off.  Please call us for any further questions.  Seizure precautions: Per [facility] statutes, patients with seizures are not allowed to drive until they have been seizure-free for six months and cleared by a physician    Use caution when using heavy equipment or power tools. Avoid working on ladders or at heights. Take showers instead of baths. Ensure the water temperature is not too high on the home water heater. Do not go swimming alone. Do not lock yourself in a room alone (i.e. bathroom). When caring for infants or small children, sit down when holding, feeding, or changing them to  minimize risk of injury to the child in the event you have a seizure. Maintain good sleep hygiene. Avoid alcohol.    If patient has another seizure, call 911 and bring them back to the ED if: A.  The seizure lasts longer than 5 minutes.      B.  The patient doesn't wake shortly after the seizure or has new problems such as difficulty seeing, speaking or moving following the seizure C.  The patient was injured during the seizure D.  The patient has a temperature over 102 F (39C) E.  The patient vomited during the seizure and now is having trouble breathing    During the Seizure   - First,  ensure adequate ventilation and place patients on the floor on their left side  Loosen clothing around the neck and ensure the airway is patent. If the patient is clenching the teeth, do not force the mouth open with any object as this can cause severe damage - Remove all items from the surrounding that can be hazardous. The patient may be oblivious to what's happening and may not even know what he or she is doing. If the patient is confused and wandering, either gently guide him/her away and block access to outside areas - Reassure the individual and be comforting - Call 911. In most cases, the seizure ends before EMS arrives. However, there are cases when seizures may last over 3 to 5 minutes. Or the individual may have developed breathing difficulties or severe injuries. If a pregnant patient or a person with diabetes develops a seizure, it is prudent to call an ambulance. - Finally, if the patient does not regain full consciousness, then call EMS. Most patients will remain confused for about 45 to 90 minutes after a seizure, so you must use judgment in calling for help.    After the Seizure (Postictal Stage)   After a seizure, most patients experience confusion, fatigue, muscle pain and/or a headache. Thus, one should permit the individual to sleep. For the next few days, reassurance is essential. Being calm and helping reorient the person is also of importance.   Most seizures are painless and end spontaneously. Seizures are not harmful to others but can lead to complications such as stress on the lungs, brain and the heart. Individuals with prior lung problems may develop labored breathing and respiratory distress.    I have spent a total of 35 minutes with the patient reviewing hospital notes,  test results, labs and examining the patient as well as establishing an assessment and plan.  > 50% of time was spent in direct patient care.    Zeb Comfort Epilepsy [facility] For  questions after 5pm please refer to AMION to reach the Neurologist on call

## 2022-10-28 NOTE — Procedures (Addendum)
Patient Name: Jean Mcclain  MRN: [id number]  Epilepsy Attending: Lora Havens  Referring Physician/Provider: Lora Havens, MD  Duration: 10/27/2022 1318 to 10/28/2022 1318   Patient history: 77 year old female with seizure-like episodes.  EEG to evaluate for seizure.   Level of alertness: Awake, asleep   AEDs during EEG study: LEV   Technical aspects: This EEG study was done with scalp electrodes positioned according to the 10-20 International system of electrode placement. Electrical activity was reviewed with band pass filter of 1-'70Hz'$ , sensitivity of 7 uV/mm, display speed of 79m/sec with a '60Hz'$  notched filter applied as appropriate. EEG data were recorded continuously and digitally stored.  Video monitoring was available and reviewed as appropriate.   Description: The posterior dominant rhythm consists of '8Hz'$  activity of moderate voltage (25-35 uV) seen predominantly in posterior head regions, symmetric and reactive to eye opening and eye closing. Sleep was characterized by vertex waves, sleep spindles (12 to 14 Hz), maximal frontocentral region. EEG showed intermittent generalized predominantly 5 to 7 Hz theta slowing admixed with 2 to 3 Hz generalized delta slowing. Hyperventilation and photic stimulation were not performed.      ABNORMALITY -Intermittent slow, generalized   IMPRESSION: This study is suggestive of mild to moderate diffuse encephalopathy, nonspecific etiology.  No seizures or epileptiform discharges were seen throughout the recording.   Vance Belcourt [name]

## 2022-10-28 NOTE — Telephone Encounter (Signed)
Pt still admitted to hospital

## 2022-10-28 NOTE — Progress Notes (Signed)
[facility] Progress Note   Subjective:   Patient seen and examined in room.  No seizures yesterday.  EEG in progress.  Denies CP, SOB, abdominal pain and n/v/d.   Objective Vitals:   10/27/22 2300 10/28/22 0300 10/28/22 0600 10/28/22 0800  BP: (!) 112/48 (!) 115/52  127/83  Pulse: 60 64 65 (!) 59  Resp: '16 18 15 17  '$ Temp: 97.8 F (36.6 C) 97.7 F (36.5 C)    TempSrc: Axillary Oral    SpO2: 94% 100% 100% 99%  Weight:      Height:       Physical Exam General:chronically ill appearing female in NAD Heart:RRR, no mrg Lungs:CTAB, nml WOB on 2L O2 Abdomen:soft, NTND Extremities:trace LE edema Dialysis Access: RU AVF +b/t   Filed Weights   10/26/22 0820 10/27/22 1245 10/27/22 1710  Weight: 67.4 kg 70.9 kg 67.2 kg    Intake/Output Summary (Last 24 hours) at 10/28/2022 1205 Last data filed at 10/28/2022 1144 Gross per 24 hour  Intake --  Output 1401 ml  Net -1401 ml    Additional Objective Labs: Basic Metabolic Panel: Recent Labs  Lab 10/26/22 0834 10/27/22 0253 10/28/22 0507  NA 133* 135 134*  K 3.9 4.1 3.8  CL 99 100 97*  CO2 '26 27 28  '$ GLUCOSE 90 66* 66*  BUN 24* 24* 16  CREATININE 3.49* 3.84* 2.94*  CALCIUM 8.0* 8.1* 7.8*  PHOS 3.2 3.9 2.7   Liver Function Tests: Recent Labs  Lab 10/24/22 0404 10/24/22 1042 10/26/22 0834 10/27/22 0253 10/28/22 0507  AST 7*  --   --   --   --   ALT 5  --   --   --   --   ALKPHOS 96  --   --   --   --   BILITOT 0.8  --   --   --   --   PROT 5.1*  --   --   --   --   ALBUMIN 2.7*   < > 2.4* 2.4* 2.4*   < > = values in this interval not displayed.   CBC: Recent Labs  Lab 10/24/22 0404 10/25/22 0011 10/25/22 0628 10/26/22 0834 10/27/22 0253 10/28/22 0507  WBC 6.7 6.2  --  4.4 3.9* 4.2  NEUTROABS 5.3  --   --  3.4 2.8 3.0  HGB 7.3* 9.1*  --  8.4* 8.2* 8.4*  HCT 24.7* 29.3*  --  27.0* 27.7* 26.7*  MCV 92.2 91.0  --  90.9 93.3 90.2  PLT 118* 98*   < > 83* 80* 69*   < > = values in this  interval not displayed.   CBG: Recent Labs  Lab 10/27/22 0735 10/27/22 1209 10/27/22 1230 10/27/22 2051 10/28/22 0915  GLUCAP 79 64* 79 76 85   Studies/Results: CT SOFT TISSUE NECK W CONTRAST  Result Date: 10/27/2022 CLINICAL DATA:  Neck mass, non pulsatile, present for several years and seen by preceding brain MRI EXAM: CT NECK WITH CONTRAST TECHNIQUE: Multidetector CT imaging of the neck was performed using the standard protocol following the bolus administration of intravenous contrast. RADIATION DOSE REDUCTION: This exam was performed according to the departmental dose-optimization program which includes automated exposure control, adjustment of the mA and/or kV according to patient size and/or use of iterative reconstruction technique. CONTRAST:  93m OMNIPAQUE IOHEXOL 350 MG/ML SOLN COMPARISON:  None Available. FINDINGS: Pharynx and larynx: No evidence of mass or inflammation. Salivary glands: Normal Thyroid: No significant finding Lymph  nodes: No worrisome lymph node enlargement or heterogeneity. Vascular: Partial retropharyngeal course. Mild atheromatous calcification for age and end-stage renal disease Limited intracranial: Negative Visualized orbits: Not covered Mastoids and visualized paranasal sinuses: Clear Skeleton: Cervical spine degeneration with confluent osteophytes in the thoracic spine. Advanced C1-2 facet and left shoulder degeneration. Upper chest: Head sizable layering pleural effusions with atelectasis. Other: The finding of concern by prior brain MRI is a subcutaneous homogeneous mass measuring 2.7 cm in the left supraclavicular neck, most consistent with dermal inclusion cyst or other benign process, appearance stable from a thoracic spine CT 01/09/2022 IMPRESSION: 1. The subcutaneous left neck mass by MRI has benign and stable CT features most consistent with dermal inclusion cyst or other benign process. 2. Sizable layering pleural effusions with atelectasis. Electronically  Signed   By: Jorje Guild M.D.   On: 10/27/2022 12:04   Overnight EEG with video  Result Date: 10/27/2022 Jean Havens, MD     10/28/2022  8:49 AM Patient Name: Jean Mcclain MRN: [id number] Epilepsy Attending: Lora Mcclain Referring Physician/Provider: Lora Havens, MD Duration: 10/26/2022 1318 to 10/27/2022 1318  Patient history: 77 year old female with seizure-like episodes.  EEG to evaluate for seizure.  Level of alertness: Awake, asleep  AEDs during EEG study: LEV  Technical aspects: This EEG study was done with scalp electrodes positioned according to the 10-20 International system of electrode placement. Electrical activity was reviewed with band pass filter of 1-'70Hz'$ , sensitivity of 7 uV/mm, display speed of 16m/sec with a '60Hz'$  notched filter applied as appropriate. EEG data were recorded continuously and digitally stored.  Video monitoring was available and reviewed as appropriate.  Description: The posterior dominant rhythm consists of '8Hz'$  activity of moderate voltage (25-35 uV) seen predominantly in posterior head regions, symmetric and reactive to eye opening and eye closing. Sleep was characterized by vertex waves, sleep spindles (12 to 14 Hz), maximal frontocentral region. EEG showed intermittent generalized predominantly 5 to 7 Hz theta slowing admixed with 2 to 3 Hz generalized delta slowing. Hyperventilation and photic stimulation were not performed.    ABNORMALITY -Intermittent slow, generalized  IMPRESSION: This study is suggestive of mild to moderate diffuse encephalopathy, nonspecific etiology.  No seizures or epileptiform discharges were seen throughout the recording.  Priyanka [name]   Medications:  anticoagulant sodium citrate     levETIRAcetam     levETIRAcetam     levETIRAcetam 500 mg (10/27/22 2056)   promethazine (PHENERGAN) injection (IM or IVPB)      sodium chloride   Intravenous Once   aspirin  81 mg Oral Daily   atorvastatin  40 mg Oral Daily    buprenorphine  1 patch Transdermal Weekly   calcitRIOL  1.25 mcg Oral Q M,W,F-HD   camphor-menthol  1 Application Topical Daily   carbidopa-levodopa  2 tablet Oral q AM   And   carbidopa-levodopa  1 tablet Oral BID   Chlorhexidine Gluconate Cloth  6 each Topical Q0600   gabapentin  300 mg Oral QHS   insulin aspart  0-5 Units Subcutaneous QHS   insulin aspart  0-6 Units Subcutaneous TID WC   levETIRAcetam  250 mg Oral Q M,W,F   levETIRAcetam  500 mg Oral BID   melatonin  5 mg Oral QHS   midodrine  20 mg Oral Q M,W,F-HD   mirtazapine  15 mg Oral QHS   nystatin  1 Application Topical Daily   pantoprazole  40 mg Oral BID   rOPINIRole  12  mg Oral Daily   sodium chloride flush  3 mL Intravenous Q12H   tamsulosin  0.4 mg Oral QHS    Dialysis Orders: [facility] MWF 3:15h  67.9 kg 3K/2Ca  AVF Hep 3000 Mircera 282mg - last given 10/09/22 Venofer 50 qwk   Assessment/Plan: 1. Myoclonus/ jerking: initially thought possibly gabapentin toxicity- CT head negative.  Electrolytes OK.  Stopped gabapentin. Now I'm wondering if all related to #2 MRI  - no acute intracranial findings, incomplete image mass/lesion in left neck.  2 Seizure:  First seizure 1/16, endo cancelled, loaded with Keppra, neuro following.  Another seizure at last dialysis. Stopped with 2 mg IV ativan.  EEG negative. Per neuro seizure likely d/t underlying stroke. 3. Diverticulitis of L colon - seen on CT.  Schedule for EGD and colonoscopy Monday if stable over weekend. Per GI 3 ESRD: MWF NW, HD today per regular schedule.  4 Hypertension: BP/volume ok.  Continue home meds. Close to dry, UF as tolerated.  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  Start aranesp 2062m qwk. 6. Metabolic Bone Disease - CCa, phos, Mg in goal.  Continue calcitriol. 7. Nutrition - Renal diet w/fluid restrictions.   [name] 10/28/2022,12:05 PM  LOS: 3 days

## 2022-10-28 NOTE — Progress Notes (Signed)
LTM EEG discontinued - no skin breakdown at unhook. [facility] notified 

## 2022-10-28 NOTE — Care Management Important Message (Signed)
Important Message  Patient Details  Name: Jean Mcclain MRN: [id number] Date of Birth: [birth year]   Medicare Important Message Given:  Yes     Kohei Antonellis Montine Circle 10/28/2022, 3:06 PM

## 2022-10-29 DIAGNOSIS — R4182 Altered mental status, unspecified: Secondary | ICD-10-CM | POA: Diagnosis not present

## 2022-10-29 DIAGNOSIS — Z8673 Personal history of transient ischemic attack (TIA), and cerebral infarction without residual deficits: Secondary | ICD-10-CM | POA: Diagnosis not present

## 2022-10-29 DIAGNOSIS — R569 Unspecified convulsions: Secondary | ICD-10-CM | POA: Diagnosis not present

## 2022-10-29 LAB — CBC WITH DIFFERENTIAL/PLATELET
Abs Immature Granulocytes: 0.02 10*3/uL (ref 0.00–0.07)
Basophils Absolute: 0 10*3/uL (ref 0.0–0.1)
Basophils Relative: 0 %
Eosinophils Absolute: 0.1 10*3/uL (ref 0.0–0.5)
Eosinophils Relative: 3 %
HCT: 26.1 % — ABNORMAL LOW (ref 36.0–46.0)
Hemoglobin: 8.1 g/dL — ABNORMAL LOW (ref 12.0–15.0)
Immature Granulocytes: 1 %
Lymphocytes Relative: 11 %
Lymphs Abs: 0.5 10*3/uL — ABNORMAL LOW (ref 0.7–4.0)
MCH: 27.9 pg (ref 26.0–34.0)
MCHC: 31 g/dL (ref 30.0–36.0)
MCV: 90 fL (ref 80.0–100.0)
Monocytes Absolute: 0.2 10*3/uL (ref 0.1–1.0)
Monocytes Relative: 5 %
Neutro Abs: 3.4 10*3/uL (ref 1.7–7.7)
Neutrophils Relative %: 80 %
Platelets: 68 10*3/uL — ABNORMAL LOW (ref 150–400)
RBC: 2.9 MIL/uL — ABNORMAL LOW (ref 3.87–5.11)
RDW: 17.9 % — ABNORMAL HIGH (ref 11.5–15.5)
WBC: 4.2 10*3/uL (ref 4.0–10.5)
nRBC: 0 % (ref 0.0–0.2)

## 2022-10-29 LAB — GLUCOSE, CAPILLARY
Glucose-Capillary: 69 mg/dL — ABNORMAL LOW (ref 70–99)
Glucose-Capillary: 207 mg/dL — ABNORMAL HIGH (ref 70–99)
Glucose-Capillary: 70 mg/dL (ref 70–99)
Glucose-Capillary: 111 mg/dL — ABNORMAL HIGH (ref 70–99)
Glucose-Capillary: 127 mg/dL — ABNORMAL HIGH (ref 70–99)

## 2022-10-29 LAB — RENAL FUNCTION PANEL
Albumin: 2.5 g/dL — ABNORMAL LOW (ref 3.5–5.0)
Anion gap: 11 (ref 5–15)
BUN: 22 mg/dL (ref 8–23)
CO2: 26 mmol/L (ref 22–32)
Calcium: 8.2 mg/dL — ABNORMAL LOW (ref 8.9–10.3)
Chloride: 96 mmol/L — ABNORMAL LOW (ref 98–111)
Creatinine, Ser: 3.62 mg/dL — ABNORMAL HIGH (ref 0.44–1.00)
GFR, Estimated: 12 mL/min — ABNORMAL LOW (ref >60)
Glucose, Bld: 69 mg/dL — ABNORMAL LOW (ref 70–99)
Phosphorus: 3.4 mg/dL (ref 2.5–4.6)
Potassium: 4.2 mmol/L (ref 3.5–5.1)
Sodium: 133 mmol/L — ABNORMAL LOW (ref 135–145)

## 2022-10-29 LAB — CBC
HCT: 26.2 % — ABNORMAL LOW (ref 36.0–46.0)
Hemoglobin: 8 g/dL — ABNORMAL LOW (ref 12.0–15.0)
MCH: 27.5 pg (ref 26.0–34.0)
MCHC: 30.5 g/dL (ref 30.0–36.0)
MCV: 90 fL (ref 80.0–100.0)
Platelets: 65 10*3/uL — ABNORMAL LOW (ref 150–400)
RBC: 2.91 MIL/uL — ABNORMAL LOW (ref 3.87–5.11)
RDW: 17.7 % — ABNORMAL HIGH (ref 11.5–15.5)
WBC: 5 10*3/uL (ref 4.0–10.5)
nRBC: 0 % (ref 0.0–0.2)

## 2022-10-29 LAB — MAGNESIUM: Magnesium: 1.8 mg/dL (ref 1.7–2.4)

## 2022-10-29 LAB — BRAIN NATRIURETIC PEPTIDE: B Natriuretic Peptide: 388.9 pg/mL — ABNORMAL HIGH (ref 0.0–100.0)

## 2022-10-29 MED ORDER — ALBUMIN HUMAN 25 % IV SOLN: 25.0000 g | Freq: Once | INTRAVENOUS | Status: DC

## 2022-10-29 MED ORDER — LIDOCAINE-PRILOCAINE 2.5-2.5 % EX CREA: 1.0000 | TOPICAL_CREAM | CUTANEOUS | Status: DC | PRN

## 2022-10-29 MED ORDER — PENTAFLUOROPROP-TETRAFLUOROETH EX AERO: 1.0000 | INHALATION_SPRAY | CUTANEOUS | Status: DC | PRN

## 2022-10-29 MED ORDER — ALTEPLASE 2 MG IJ SOLR: 2.0000 mg | Freq: Once | INTRAMUSCULAR | Status: DC | PRN

## 2022-10-29 MED ORDER — CHLORHEXIDINE GLUCONATE CLOTH 2 % EX PADS
6.0000 | MEDICATED_PAD | Freq: Every day | CUTANEOUS | Status: DC
  Administered 2022-10-29 – 2022-11-02 (×2): 6 via TOPICAL

## 2022-10-29 MED ORDER — ANTICOAGULANT SODIUM CITRATE 4% (200MG/5ML) IV SOLN: 5.0000 mL | Status: DC | PRN

## 2022-10-29 MED ORDER — LIDOCAINE HCL (PF) 1 % IJ SOLN: 5.0000 mL | INTRAMUSCULAR | Status: DC | PRN

## 2022-10-29 MED ORDER — PROMETHAZINE HCL 25 MG RE SUPP
25.0000 mg | Freq: Three times a day (TID) | RECTAL | Status: DC | PRN
  Administered 2022-10-30 – 2022-11-02 (×2): 25 mg via RECTAL
  Filled 2022-10-29 (×3): qty 1

## 2022-10-29 MED ORDER — HEPARIN SODIUM (PORCINE) 1000 UNIT/ML DIALYSIS: 3000.0000 [IU] | INTRAMUSCULAR | Status: DC | PRN

## 2022-10-29 MED ORDER — PANTOPRAZOLE SODIUM 40 MG IV SOLR
40.0000 mg | Freq: Two times a day (BID) | INTRAVENOUS | Status: DC
  Administered 2022-10-29 – 2022-11-06 (×17): 40 mg via INTRAVENOUS
  Filled 2022-10-29 (×17): qty 10

## 2022-10-29 MED ORDER — HEPARIN SODIUM (PORCINE) 1000 UNIT/ML DIALYSIS: 1000.0000 [IU] | INTRAMUSCULAR | Status: DC | PRN

## 2022-10-29 NOTE — Progress Notes (Signed)
[facility] Progress Note   Subjective:   Patient seen and examined at bedside. Lethargic but oriented.  Reports vomiting earlier today. Denies CP, SOB, abdominal pain, nausea and diarrhea.   Objective Vitals:   10/28/22 1935 10/29/22 0000 10/29/22 0400 10/29/22 0800  BP: (!) 144/66 (!) 131/59 (!) 124/55 127/61  Pulse: 68 60 60 (!) 59  Resp: '16 12 12 15  '$ Temp: 97.9 F (36.6 C) 98.1 F (36.7 C) 98.4 F (36.9 C)   TempSrc: Oral Oral Oral   SpO2: 99%   95%  Weight:      Height:       Physical Exam General:chronically ill appearing female in NAD Heart:RRR, no mrg Lungs:CTAB anterolaterally, nl WOB on 2L O2 Abdomen:soft, NTND Extremities:trace LE edema Dialysis Access:  RU AVF +b/t  Filed Weights   10/26/22 0820 10/27/22 1245 10/27/22 1710  Weight: 67.4 kg 70.9 kg 67.2 kg    Intake/Output Summary (Last 24 hours) at 10/29/2022 1232 Last data filed at 10/28/2022 2000 Gross per 24 hour  Intake 260 ml  Output --  Net 260 ml    Additional Objective Labs: Basic Metabolic Panel: Recent Labs  Lab 10/27/22 0253 10/28/22 0507 10/29/22 0418  NA 135 134* 133*  K 4.1 3.8 4.2  CL 100 97* 96*  CO2 '27 28 26  '$ GLUCOSE 66* 66* 69*  BUN 24* 16 22  CREATININE 3.84* 2.94* 3.62*  CALCIUM 8.1* 7.8* 8.2*  PHOS 3.9 2.7 3.4   Liver Function Tests: Recent Labs  Lab 10/24/22 0404 10/24/22 1042 10/27/22 0253 10/28/22 0507 10/29/22 0418  AST 7*  --   --   --   --   ALT 5  --   --   --   --   ALKPHOS 96  --   --   --   --   BILITOT 0.8  --   --   --   --   PROT 5.1*  --   --   --   --   ALBUMIN 2.7*   < > 2.4* 2.4* 2.5*   < > = values in this interval not displayed.   CBC: Recent Labs  Lab 10/26/22 0834 10/27/22 0253 10/28/22 0507 10/29/22 0418 10/29/22 1029  WBC 4.4 3.9* 4.2 4.2 5.0  NEUTROABS 3.4 2.8 3.0 3.4  --   HGB 8.4* 8.2* 8.4* 8.1* 8.0*  HCT 27.0* 27.7* 26.7* 26.1* 26.2*  MCV 90.9 93.3 90.2 90.0 90.0  PLT 83* 80* 69* 68* 65*   Recent Labs   Lab 10/28/22 1832 10/28/22 2149 10/29/22 0637 10/29/22 0836 10/29/22 1217  GLUCAP 80 79 69* 207* 70   Medications:  anticoagulant sodium citrate     levETIRAcetam     levETIRAcetam 500 mg (10/29/22 0822)   promethazine (PHENERGAN) injection (IM or IVPB)      sodium chloride   Intravenous Once   aspirin  81 mg Oral Daily   atorvastatin  40 mg Oral Daily   buprenorphine  1 patch Transdermal Weekly   calcitRIOL  1.25 mcg Oral Q M,W,F-HD   camphor-menthol  1 Application Topical Daily   carbidopa-levodopa  2 tablet Oral q AM   And   carbidopa-levodopa  1 tablet Oral BID   Chlorhexidine Gluconate Cloth  6 each Topical Q0600   Chlorhexidine Gluconate Cloth  6 each Topical Q0600   darbepoetin (ARANESP) injection - DIALYSIS  200 mcg Subcutaneous Q Fri-1800   gabapentin  300 mg Oral QHS   levETIRAcetam  250  mg Oral Q M,W,F   levETIRAcetam  500 mg Oral BID   melatonin  5 mg Oral QHS   midodrine  20 mg Oral Q M,W,F-HD   mirtazapine  15 mg Oral QHS   nystatin  1 Application Topical Daily   pantoprazole (PROTONIX) IV  40 mg Intravenous Q12H   rOPINIRole  12 mg Oral Daily   sodium chloride flush  3 mL Intravenous Q12H   tamsulosin  0.4 mg Oral QHS    Dialysis Orders: [facility] MWF 3:15h  67.9 kg 3K/2Ca  AVF Hep 3000 Mircera 280mg - last given 10/09/22 Venofer 50 qwk   Assessment/Plan: 1. Myoclonus/ jerking: initially thought possibly gabapentin toxicity- CT head negative.  Electrolytes OK.  Stopped gabapentin. Now I'm wondering if all related to #2 MRI  - no acute intracranial findings, incomplete image mass/lesion in left neck. EEG w/mild-mod encephalopathy, non specific etiology. No seizure activity noted.  2 Seizure:  First seizure 1/16, endo cancelled, loaded with Keppra, neuro following.  Another seizure at last dialysis. Stopped with 2 mg IV ativan.  EEG negative. Per neuro seizure likely d/t underlying stroke. 3. Diverticulitis of L colon - seen on CT.  Schedule for EGD and  colonoscopy Monday if stable over weekend. Per GI 3 ESRD: MWF NW, HD today off schedule.  Will resume regular schedule on Monday after GI procedures.  4 Hypertension: BP/volume ok.  Continue home meds. Close to dry, UF as tolerated.  5. Anemia of ESRD: Hgb 8.0, s/p 1 u pRBCs 1/15.  Start aranesp 2076m qFri 6. Metabolic Bone Disease - CCa, phos, Mg in goal.  Continue calcitriol. 7. Nutrition - Renal diet w/fluid restrictions.  [name] 10/29/2022,12:32 PM  LOS: 4 days

## 2022-10-29 NOTE — Progress Notes (Addendum)
Name: Jean Mcclain. Vandenboom DOB: 10-27-1945 DOA: 10/24/2022 PCP: No assigned PCP CC: Body shaking  Subjective:  Overnight: glucose 69 on morning labs CBG shows same. D50 given.   Slightly somnolent but arouses to command.  No acute concerns voiced.  Objective:  Vital signs in last 24 hours: Vitals:   10/28/22 1609 10/28/22 1935 10/29/22 0000 10/29/22 0400  BP: 137/70 (!) 144/66 (!) 131/59 (!) 124/55  Pulse: (!) 59 68 60 60  Resp: '20 16 12 12  '$ Temp: 98 F (36.7 C) 97.9 F (36.6 C) 98.1 F (36.7 C) 98.4 F (36.9 C)  TempSrc: Oral Oral Oral Oral  SpO2:  99%    Weight:      Height:       Supplemental O2: Nasal Cannula Last BM Date : 10/28/22 SpO2: 99 % O2 Flow Rate (L/min): 3 L/min Filed Weights   10/26/22 0820 10/27/22 1245 10/27/22 1710  Weight: 67.4 kg 70.9 kg 67.2 kg    Intake/Output Summary (Last 24 hours) at 10/29/2022 0553 Last data filed at 10/28/2022 2000 Gross per 24 hour  Intake 260 ml  Output 1 ml  Net 259 ml    Net IO Since Admission: -2,298.75 mL [10/29/22 0553] Physical Exam  General: NAD, laying comfortably in bed.  HENT: NCAT  Lungs:  CTAB Cardiovascular: NSR, good pulses Abdomen: normal bowel sounds MSK: No asymmetry. moving all extremities Neuro: alert and oriented Psych: normal mood and normal affect  Diagnostics    Latest Ref Rng & Units 10/29/2022    4:18 AM 10/28/2022    5:07 AM 10/27/2022    2:53 AM  CBC  WBC 4.0 - 10.5 K/uL 4.2  4.2  3.9   Hemoglobin 12.0 - 15.0 g/dL 8.1  8.4  8.2   Hematocrit 36.0 - 46.0 % 26.1  26.7  27.7   Platelets 150 - 400 K/uL 68  69  80        Latest Ref Rng & Units 10/29/2022    4:18 AM 10/28/2022    5:07 AM 10/27/2022    2:53 AM  CMP  Glucose 70 - 99 mg/dL 69  66  66   BUN 8 - 23 mg/dL '22  16  24   '$ Creatinine 0.44 - 1.00 mg/dL 3.62  2.94  3.84   Sodium 135 - 145 mmol/L 133  134  135   Potassium 3.5 - 5.1 mmol/L 4.2  3.8  4.1   Chloride 98 - 111 mmol/L 96  97  100   CO2 22 - 32 mmol/L '26  28  27    '$ Calcium 8.9 - 10.3 mg/dL 8.2  7.8  8.1     Mag 1.8 BNP 388.9  Assessment/Plan: Jean HACKWORTH is a 77 y.o. female with medical history significant of hypertension, hyperlipidemia, diabetes mellitus type II, ESRD on HD(M/W/F), chronic respiratory failure with hypoxia on 3 L of oxygen at night, Parkinson's disease, history of DVT not on anticoagulation, and wheelchair-bound due to left ankle fracture who presents for worsening shaking and jerking found to have seizures.  Principal Problem:   Symptomatic anemia Active Problems:   Diabetes mellitus due to underlying condition, controlled, with stage 2 chronic kidney disease, with long-term current use of insulin (HCC)   Kidney lesion   ESRD on hemodialysis (HCC)   Acute on chronic respiratory failure with hypoxia (HCC)   History of DVT (deep vein thrombosis)   Acute diverticulitis   Nausea   Jerking   Thrombocytopenia ([place])   Parkinson's  disease   Abnormal TSH   Abnormal CT scan, gastrointestinal tract   Abnormal movements   Anemia  Symptomatic acute on chronic anemia.  Possible occult GI blood loss with recent history of diverticulitis and pending outpatient colonoscopy.   Hgb stable.Workup negative for DIC, LDH stable ruling out any intravascular hemolysis, she is s/p 1 unit of packed RBC transfusion on 10/24/2022 in the ER, she is on PPI, of note she is currently being worked for possible diverticular mass versus persistent diverticulitis and is due for colonoscopy.  Defer further management of GI issues to GI team.  Continue to monitor H&H.  Defer any antibiotic to the GI team as well.  GI planning EGD and colonoscopy Monday. Abdominal exam is unremarkable.    New onset seizures 2 episodes in the hospital both witnessed. Improved. EEG removed. EEG was non acute, head CT negative, placed on scheduled Keppra along with as needed Ativan for breakthrough seizure, neuro on board, MRI brain noted with old stroke and some nonspecific neck  findings.  Case discussed with neurology on 10/27/2022 continue on Keppra and monitor.    ESRD on HD.  Patient on MWF schedule.  Completed HD yesterday with 1.4 L output. Nephrology on board.  Incidental Neck Mass  Noted on MRI. CT soft tissue neck shows mass that is most likely benign and most consistent with dermal inclusion cyst.  -PCP to monitor outpatient  Acute on chronic hypoxic respiratory failure with underlying OSA.  Resolved. At home on 3 L and here on 2-3 L of oxygen.  Likely increased initially due to low hgb.    History of DVT in 2018.  Not on anticoagulation anymore, repeat lower extremity venous ultrasound ordered during admission remains negative.   History of Parkinson's disease.  Continue home regimen.   History of sick euthyroid syndrome.  Stable TSH now.   Mild persistent thrombocytopenia.  Stable at 68k. Will continue to monitor. Not on heparin. DIC panel negative and smear showed no schistocytes. On keppra which can cause thrombocytopenia. Will continue to monitor closely with daily labs.    Kidney lesion - Noted to be increased in size on CT scan from 07/2022 for which MRI of the abdomen recommended in 6 months at that time, PCP to schedule it for April 2024.   DM type II.  A1c is 6.9 in 07/2022 . Currently on sliding scale monitor and adjust. CBG within range.   Diet: Soft Diet IVF: None, VTE: SCDs Code: DNR PT/OT recs: pending Prior to Admission Living Arrangement: ALF Anticipated Discharge Location: TBD likely ALF Barriers to Discharge: Medical stability Dispo: Anticipated discharge in approximately 1-2 day(s).   Idamae Schuller, MD [facility]. [facility] Internal Medicine Residency, PGY-2  Pager: [phone] After 5 pm and on weekends: Please call the on-call pager    Attestation   I have directly reviewed the clinical findings, lab results and imaging studies. I have interviewed and examined the patient and agree with the documentation and  management as recorded by Dr.Khan -seizures better controlled on Keppra, case discussed with neurology and GI possible EGD colonoscopy on Monday, continue to monitor H&H.  Mild thrombocytopenia which is stable, will monitor closely, switch PPI to Pepcid, could be due to [place] but will monitor closely no signs of bleeding.

## 2022-10-29 NOTE — Progress Notes (Signed)
Date and time results received: 10/29/22 0640   Test: CBG Critical Value: 69  Name of Provider Notified: Alcario Drought  Orders Received? Or Actions Taken?:   Amp D50 administered

## 2022-10-29 NOTE — Procedures (Signed)
Patient Name: Jean Mcclain  MRN: [id number]  Epilepsy Attending: Lora Havens  Referring Physician/Provider: Lora Havens, MD  Duration: 10/28/2022 1318 to 10/28/2022 1729   Patient history: 77 year old female with seizure-like episodes.  EEG to evaluate for seizure.   Level of alertness: Awake, asleep   AEDs during EEG study: LEV   Technical aspects: This EEG study was done with scalp electrodes positioned according to the 10-20 International system of electrode placement. Electrical activity was reviewed with band pass filter of 1-'70Hz'$ , sensitivity of 7 uV/mm, display speed of 44m/sec with a '60Hz'$  notched filter applied as appropriate. EEG data were recorded continuously and digitally stored.  Video monitoring was available and reviewed as appropriate.   Description: The posterior dominant rhythm consists of '8Hz'$  activity of moderate voltage (25-35 uV) seen predominantly in posterior head regions, symmetric and reactive to eye opening and eye closing. Sleep was characterized by vertex waves, sleep spindles (12 to 14 Hz), maximal frontocentral region. EEG showed intermittent generalized predominantly 5 to 7 Hz theta slowing admixed with 2 to 3 Hz generalized delta slowing. Hyperventilation and photic stimulation were not performed.      ABNORMALITY -Intermittent slow, generalized   IMPRESSION: This study is suggestive of mild to moderate diffuse encephalopathy, nonspecific etiology.  No seizures or epileptiform discharges were seen throughout the recording.   Luismiguel Lamere [name]

## 2022-10-29 NOTE — Progress Notes (Signed)
Pt dry heaving and spitting up saliva. Tried to give iv zofran but her IV access is leaking.  Rosey Bath, RN notified

## 2022-10-29 NOTE — Progress Notes (Addendum)
Received patient in bed to unit.  Alert and oriented.  Informed consent signed and in chart.   Treatment initiated: 13:14 Treatment completed: 17:25  Patient tolerated well.  Transported back to the room  Alert, without acute distress.  Hand-off given to patient's nurse.   Access used: right AVF Access issues: none  Total UF removed: 1.9L Medication(s) given: midodrine '20mg'$ , zofran      10/29/22 1725  Vitals  Temp 97.7 F (36.5 C)  Temp Source Oral  BP (!) 104/54  MAP (mmHg) 70  BP Location Left Arm  BP Method Automatic  Patient Position (if appropriate) Lying  Pulse Rate (!) 47  Pulse Rate Source Monitor  ECG Heart Rate (!) 54  Resp 13  Oxygen Therapy  SpO2 96 %  O2 Device Nasal Cannula  O2 Flow Rate (L/min) 3 L/min  During Treatment Monitoring  HD Safety Checks Performed Yes  Intra-Hemodialysis Comments Tolerated well;Tx completed  Dialysis Fluid Bolus Normal Saline  Post Treatment  Dialyzer Clearance Lightly streaked  Duration of HD Treatment -hour(s) 4 hour(s)  Liters Processed 96  Fluid Removed (mL) 1900 mL  Tolerated HD Treatment Yes  AVG/AVF Arterial Site Held (minutes) 7 minutes  AVG/AVF Venous Site Held (minutes) 7 minutes  Fistula / Graft Right Upper arm  No placement date or time found.   Orientation: Right  Access Location: Upper arm  Site Condition No complications  Fistula / Graft Assessment Present;Thrill;Bruit  Status Deaccessed  Drainage Description None      [facility]

## 2022-10-30 ENCOUNTER — Encounter (HOSPITAL_COMMUNITY): Payer: Self-pay | Admitting: Anesthesiology

## 2022-10-30 ENCOUNTER — Inpatient Hospital Stay (HOSPITAL_COMMUNITY): Payer: PPO

## 2022-10-30 DIAGNOSIS — D649 Anemia, unspecified: Secondary | ICD-10-CM | POA: Diagnosis not present

## 2022-10-30 DIAGNOSIS — R110 Nausea: Secondary | ICD-10-CM | POA: Diagnosis not present

## 2022-10-30 DIAGNOSIS — R933 Abnormal findings on diagnostic imaging of other parts of digestive tract: Secondary | ICD-10-CM | POA: Diagnosis not present

## 2022-10-30 LAB — CBC WITH DIFFERENTIAL/PLATELET
Abs Immature Granulocytes: 0.04 10*3/uL (ref 0.00–0.07)
Basophils Absolute: 0 10*3/uL (ref 0.0–0.1)
Basophils Relative: 0 %
Eosinophils Absolute: 0 10*3/uL (ref 0.0–0.5)
Eosinophils Relative: 1 %
HCT: 27.4 % — ABNORMAL LOW (ref 36.0–46.0)
Hemoglobin: 8.7 g/dL — ABNORMAL LOW (ref 12.0–15.0)
Immature Granulocytes: 1 %
Lymphocytes Relative: 9 %
Lymphs Abs: 0.4 10*3/uL — ABNORMAL LOW (ref 0.7–4.0)
MCH: 28.2 pg (ref 26.0–34.0)
MCHC: 31.8 g/dL (ref 30.0–36.0)
MCV: 88.7 fL (ref 80.0–100.0)
Monocytes Absolute: 0.2 10*3/uL (ref 0.1–1.0)
Monocytes Relative: 5 %
Neutro Abs: 4.2 10*3/uL (ref 1.7–7.7)
Neutrophils Relative %: 84 %
Platelets: 72 10*3/uL — ABNORMAL LOW (ref 150–400)
RBC: 3.09 MIL/uL — ABNORMAL LOW (ref 3.87–5.11)
RDW: 17.6 % — ABNORMAL HIGH (ref 11.5–15.5)
WBC: 5 10*3/uL (ref 4.0–10.5)
nRBC: 0 % (ref 0.0–0.2)

## 2022-10-30 LAB — RENAL FUNCTION PANEL
Albumin: 2.5 g/dL — ABNORMAL LOW (ref 3.5–5.0)
Anion gap: 9 (ref 5–15)
BUN: 11 mg/dL (ref 8–23)
CO2: 26 mmol/L (ref 22–32)
Calcium: 7.9 mg/dL — ABNORMAL LOW (ref 8.9–10.3)
Chloride: 97 mmol/L — ABNORMAL LOW (ref 98–111)
Creatinine, Ser: 2.17 mg/dL — ABNORMAL HIGH (ref 0.44–1.00)
GFR, Estimated: 23 mL/min — ABNORMAL LOW (ref >60)
Glucose, Bld: 105 mg/dL — ABNORMAL HIGH (ref 70–99)
Phosphorus: 2.5 mg/dL (ref 2.5–4.6)
Potassium: 3.7 mmol/L (ref 3.5–5.1)
Sodium: 132 mmol/L — ABNORMAL LOW (ref 135–145)

## 2022-10-30 LAB — GLUCOSE, CAPILLARY
Glucose-Capillary: 119 mg/dL — ABNORMAL HIGH (ref 70–99)
Glucose-Capillary: 104 mg/dL — ABNORMAL HIGH (ref 70–99)
Glucose-Capillary: 100 mg/dL — ABNORMAL HIGH (ref 70–99)
Glucose-Capillary: 105 mg/dL — ABNORMAL HIGH (ref 70–99)

## 2022-10-30 MED ORDER — BISACODYL 10 MG RE SUPP
10.0000 mg | Freq: Once | RECTAL | Status: DC
  Filled 2022-10-30: qty 1

## 2022-10-30 MED ORDER — CHLORHEXIDINE GLUCONATE CLOTH 2 % EX PADS
6.0000 | MEDICATED_PAD | Freq: Every day | CUTANEOUS | Status: DC
  Administered 2022-10-31: 6 via TOPICAL

## 2022-10-30 NOTE — Progress Notes (Signed)
Daily Progress Note  Hospital Day: 7  Chief Complaint: nausea / vomiting, persistent diverticulitis on CT scan   Brief Narrative:  Jean Mcclain is a 77 y.o. female with a pmh not limited to hypertension, hyperlipidemia, diabetes mellitus type II, ESRD on HD(M/W/F), chronic respiratory failure with hypoxia on 3 L of oxygen at night, Parkinson's disease, history of DVT not on anticoagulation  Assessment    # 77 yo female with refractory N/V. Had a seizure in pre-procedure are where she was awaiting EGD.   # Persistent abnormality of sigmoid colon on CT scan. She has previously had abdominal imaging showing sigmoid diverticulitis with an intramural abscess back in October, repeated CT scans x 2 in December showed persistent sigmoid diverticulitis, and most recent CT in January 11 shows mild diverticulitis at junction of sigmoid and descending colon without complicating features. She has been on antibiotics. No prior colonoscopy.   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 On keppra now  # ESRD on HD  # Chronic respiratory failure on home 02  Plan:   Not likely to tolerate bowel prep with ongoing nausea. Will plan for a flexible sigmoidoscopy. Will order enemas to be given early am.   Subjective  Very nauseated. Trying to sips on clears. Doesn't think she will be able to tolerate a bowel prep    Objective   Imaging:  DG Abd Portable 1V  Result Date: 10/30/2022 CLINICAL DATA:  Nausea and vomiting. EXAM: PORTABLE ABDOMEN - 1 VIEW COMPARISON:  10/24/2022 FINDINGS: No pathologic dilatation of the large or small bowel loops identified. There is gas noted within the colon and rectum. Benign calcification within the right pelvis is again noted. IMPRESSION: Nonobstructive bowel gas pattern. Electronically Signed   By: Kerby Moors M.D.   On: 10/30/2022 07:38   CT SOFT TISSUE NECK W CONTRAST  Result Date: 10/27/2022 CLINICAL DATA:  Neck mass, non pulsatile, present for several years  and seen by preceding brain MRI EXAM: CT NECK WITH CONTRAST TECHNIQUE: Multidetector CT imaging of the neck was performed using the standard protocol following the bolus administration of intravenous contrast. RADIATION DOSE REDUCTION: This exam was performed according to the departmental dose-optimization program which includes automated exposure control, adjustment of the mA and/or kV according to patient size and/or use of iterative reconstruction technique. CONTRAST:  93m OMNIPAQUE IOHEXOL 350 MG/ML SOLN COMPARISON:  None Available. FINDINGS: Pharynx and larynx: No evidence of mass or inflammation. Salivary glands: Normal Thyroid: No significant finding Lymph nodes: No worrisome lymph node enlargement or heterogeneity. Vascular: Partial retropharyngeal course. Mild atheromatous calcification for age and end-stage renal disease Limited intracranial: Negative Visualized orbits: Not covered Mastoids and visualized paranasal sinuses: Clear Skeleton: Cervical spine degeneration with confluent osteophytes in the thoracic spine. Advanced C1-2 facet and left shoulder degeneration. Upper chest: Head sizable layering pleural effusions with atelectasis. Other: The finding of concern by prior brain MRI is a subcutaneous homogeneous mass measuring 2.7 cm in the left supraclavicular neck, most consistent with dermal inclusion cyst or other benign process, appearance stable from a thoracic spine CT 01/09/2022 IMPRESSION: 1. The subcutaneous left neck mass by MRI has benign and stable CT features most consistent with dermal inclusion cyst or other benign process. 2. Sizable layering pleural effusions with atelectasis. Electronically Signed   By: JJorje GuildM.D.   On: 10/27/2022 12:04   Overnight EEG with video  Result Date: 10/27/2022 YLora Havens MD     10/28/2022  8:49 AM [name] MRN: [id number] Epilepsy Attending: Lora Havens Referring Physician/Provider: Lora Havens, MD  Duration: 10/26/2022 1318 to 10/27/2022 1318  Patient history: 77 year old female with seizure-like episodes.  EEG to evaluate for seizure.  Level of alertness: Awake, asleep  AEDs during EEG study: LEV  Technical aspects: This EEG study was done with scalp electrodes positioned according to the 10-20 International system of electrode placement. Electrical activity was reviewed with band pass filter of 1-'70Hz'$ , sensitivity of 7 uV/mm, display speed of 58m/sec with a '60Hz'$  notched filter applied as appropriate. EEG data were recorded continuously and digitally stored.  Video monitoring was available and reviewed as appropriate.  Description: The posterior dominant rhythm consists of '8Hz'$  activity of moderate voltage (25-35 uV) seen predominantly in posterior head regions, symmetric and reactive to eye opening and eye closing. Sleep was characterized by vertex waves, sleep spindles (12 to 14 Hz), maximal frontocentral region. EEG showed intermittent generalized predominantly 5 to 7 Hz theta slowing admixed with 2 to 3 Hz generalized delta slowing. Hyperventilation and photic stimulation were not performed.    ABNORMALITY -Intermittent slow, generalized  IMPRESSION: This study is suggestive of mild to moderate diffuse encephalopathy, nonspecific etiology.  No seizures or epileptiform discharges were seen throughout the recording.  PLora Havens   Lab Results: Recent Labs    10/29/22 0418 10/29/22 1029 10/30/22 0233  WBC 4.2 5.0 5.0  HGB 8.1* 8.0* 8.7*  HCT 26.1* 26.2* 27.4*  PLT 68* 65* 72*   BMET Recent Labs    10/28/22 0507 10/29/22 0418 10/30/22 0233  NA 134* 133* 132*  K 3.8 4.2 3.7  CL 97* 96* 97*  CO2 '28 26 26  '$ GLUCOSE 66* 69* 105*  BUN '16 22 11  '$ CREATININE 2.94* 3.62* 2.17*  CALCIUM 7.8* 8.2* 7.9*   LFT Recent Labs    10/30/22 0233  ALBUMIN 2.5*   PT/INR No results for input(s): "LABPROT", "INR" in the last 72 hours.   Scheduled inpatient medications:   sodium chloride    Intravenous Once   aspirin  81 mg Oral Daily   atorvastatin  40 mg Oral Daily   bisacodyl  10 mg Rectal Once   buprenorphine  1 patch Transdermal Weekly   calcitRIOL  1.25 mcg Oral Q M,W,F-HD   camphor-menthol  1 Application Topical Daily   carbidopa-levodopa  2 tablet Oral q AM   And   carbidopa-levodopa  1 tablet Oral BID   Chlorhexidine Gluconate Cloth  6 each Topical Q0600   Chlorhexidine Gluconate Cloth  6 each Topical Q0600   [START ON 10/31/2022] Chlorhexidine Gluconate Cloth  6 each Topical Q0600   darbepoetin (ARANESP) injection - DIALYSIS  200 mcg Subcutaneous Q Fri-1800   levETIRAcetam  250 mg Oral Q M,W,F   levETIRAcetam  500 mg Oral BID   melatonin  5 mg Oral QHS   midodrine  20 mg Oral Q M,W,F-HD   mirtazapine  15 mg Oral QHS   nystatin  1 Application Topical Daily   pantoprazole (PROTONIX) IV  40 mg Intravenous Q12H   rOPINIRole  12 mg Oral Daily   sodium chloride flush  3 mL Intravenous Q12H   tamsulosin  0.4 mg Oral QHS   Continuous inpatient infusions:   albumin human     anticoagulant sodium citrate     levETIRAcetam     levETIRAcetam 500 mg (10/29/22 2216)   PRN inpatient medications: acetaminophen **OR** acetaminophen, albuterol, alteplase, anticoagulant sodium citrate, dextrose, fluticasone, heparin,  heparin, hydrALAZINE, lidocaine (PF), lidocaine-prilocaine, midazolam, ondansetron **OR** ondansetron (ZOFRAN) IV, mouth rinse, pentafluoroprop-tetrafluoroeth, promethazine, simethicone  Vital signs in last 24 hours: Temp:  [97.7 F (36.5 C)-98.4 F (36.9 C)] 98 F (36.7 C) (01/21 1208) Pulse Rate:  [47-93] 62 (01/21 1208) Resp:  [13-21] 19 (01/21 1208) BP: (73-141)/(43-76) 127/53 (01/21 1208) SpO2:  [94 %-100 %] 100 % (01/21 1208) Last BM Date : 10/29/22  Intake/Output Summary (Last 24 hours) at 10/30/2022 1354 Last data filed at 10/29/2022 1725 Gross per 24 hour  Intake --  Output 1900 ml  Net -1900 ml    Intake/Output from previous day: 01/20  0701 - 01/21 0700 In: -  Out: 1900  Intake/Output this shift: No intake/output data recorded.   Physical Exam:  General: Alert female in NAD Heart:  Regular rate and rhythm.  Pulmonary: Normal respiratory effort Abdomen: Soft, nondistended, mild periumbilical tenderness.  Normal bowel sounds. Neurologic: Alert and oriented Psych: Pleasant. Cooperative.    Principal Problem:   Symptomatic anemia Active Problems:   Diabetes mellitus due to underlying condition, controlled, with stage 2 chronic kidney disease, with long-term current use of insulin (HCC)   Kidney lesion   ESRD on hemodialysis ([place])   Acute on chronic respiratory failure with hypoxia (HCC)   History of DVT (deep vein thrombosis)   Acute diverticulitis   Nausea   Jerking   Thrombocytopenia (HCC)   Parkinson's disease   Abnormal TSH   Abnormal CT scan, gastrointestinal tract   Abnormal movements   Anemia     LOS: 5 days   Tye Savoy ,NP 10/30/2022, 1:54 PM

## 2022-10-30 NOTE — Progress Notes (Signed)
[facility] Progress Note   Subjective:   Patient seen and examined at bedside.  Complains of nausea today.  Reports nausea yesterday near end of HD as well.  Denies CP, SOB, vomiting and diarrhea.   Objective Vitals:   10/29/22 1725 10/29/22 1844 10/29/22 2311 10/30/22 0805  BP: (!) 104/54 (!) 141/56 139/76 (!) 111/44  Pulse: (!) 47 68 61 61  Resp: '13 20 20 17  '$ Temp: 97.7 F (36.5 C)  97.9 F (36.6 C) 98.4 F (36.9 C)  TempSrc: Oral  Oral   SpO2: 96% 94% 98% 99%  Weight:      Height:       Physical Exam General:chronically ill appearing female in NAD Heart:RRR, no mrg Lungs:CTAB anterolaterally, nml WOB on 2L O2 via [place] Abdomen:soft, NTND Extremities:trace LE edema  Dialysis Access: RU AVF +b/t   Filed Weights   10/27/22 1245 10/27/22 1710 10/29/22 1300  Weight: 70.9 kg 67.2 kg 62.7 kg    Intake/Output Summary (Last 24 hours) at 10/30/2022 1145 Last data filed at 10/29/2022 1725 Gross per 24 hour  Intake --  Output 1900 ml  Net -1900 ml    Additional Objective Labs: Basic Metabolic Panel: Recent Labs  Lab 10/28/22 0507 10/29/22 0418 10/30/22 0233  NA 134* 133* 132*  K 3.8 4.2 3.7  CL 97* 96* 97*  CO2 '28 26 26  '$ GLUCOSE 66* 69* 105*  BUN '16 22 11  '$ CREATININE 2.94* 3.62* 2.17*  CALCIUM 7.8* 8.2* 7.9*  PHOS 2.7 3.4 2.5   Liver Function Tests: Recent Labs  Lab 10/24/22 0404 10/24/22 1042 10/28/22 0507 10/29/22 0418 10/30/22 0233  AST 7*  --   --   --   --   ALT 5  --   --   --   --   ALKPHOS 96  --   --   --   --   BILITOT 0.8  --   --   --   --   PROT 5.1*  --   --   --   --   ALBUMIN 2.7*   < > 2.4* 2.5* 2.5*   < > = values in this interval not displayed.   CBC: Recent Labs  Lab 10/27/22 0253 10/28/22 0507 10/29/22 0418 10/29/22 1029 10/30/22 0233  WBC 3.9* 4.2 4.2 5.0 5.0  NEUTROABS 2.8 3.0 3.4  --  4.2  HGB 8.2* 8.4* 8.1* 8.0* 8.7*  HCT 27.7* 26.7* 26.1* 26.2* 27.4*  MCV 93.3 90.2 90.0 90.0 88.7  PLT 80* 69* 68* 65*  72*   CBG: Recent Labs  Lab 10/29/22 0836 10/29/22 1217 10/29/22 1856 10/29/22 2126 10/30/22 0843  GLUCAP 207* 70 111* 127* 119*    Studies/Results: DG Abd Portable 1V  Result Date: 10/30/2022 CLINICAL DATA:  Nausea and vomiting. EXAM: PORTABLE ABDOMEN - 1 VIEW COMPARISON:  10/24/2022 FINDINGS: No pathologic dilatation of the large or small bowel loops identified. There is gas noted within the colon and rectum. Benign calcification within the right pelvis is again noted. IMPRESSION: Nonobstructive bowel gas pattern. Electronically Signed   By: Kerby Moors M.D.   On: 10/30/2022 07:38    Medications:  albumin human     anticoagulant sodium citrate     levETIRAcetam     levETIRAcetam 500 mg (10/29/22 2216)    sodium chloride   Intravenous Once   aspirin  81 mg Oral Daily   atorvastatin  40 mg Oral Daily   bisacodyl  10 mg Rectal Once  buprenorphine  1 patch Transdermal Weekly   calcitRIOL  1.25 mcg Oral Q M,W,F-HD   camphor-menthol  1 Application Topical Daily   carbidopa-levodopa  2 tablet Oral q AM   And   carbidopa-levodopa  1 tablet Oral BID   Chlorhexidine Gluconate Cloth  6 each Topical Q0600   Chlorhexidine Gluconate Cloth  6 each Topical Q0600   darbepoetin (ARANESP) injection - DIALYSIS  200 mcg Subcutaneous Q Fri-1800   levETIRAcetam  250 mg Oral Q M,W,F   levETIRAcetam  500 mg Oral BID   melatonin  5 mg Oral QHS   midodrine  20 mg Oral Q M,W,F-HD   mirtazapine  15 mg Oral QHS   nystatin  1 Application Topical Daily   pantoprazole (PROTONIX) IV  40 mg Intravenous Q12H   rOPINIRole  12 mg Oral Daily   sodium chloride flush  3 mL Intravenous Q12H   tamsulosin  0.4 mg Oral QHS    Dialysis Orders: [facility] MWF 3:15h  67.9 kg 3K/2Ca  AVF Hep 3000 Mircera 278mg - last given 10/09/22 Venofer 50 qwk   Assessment/Plan: 1. Myoclonus/ jerking: initially thought possibly gabapentin toxicity- CT head negative.  Electrolytes OK.  Stopped gabapentin. Now I'm wondering if  all related to #2 MRI  - no acute intracranial findings, incomplete image mass/lesion in left neck. EEG w/mild-mod encephalopathy, non specific etiology. No seizure activity noted.  2 Seizure:  First seizure 1/16, endo cancelled, loaded with Keppra, neuro following.  Another seizure at last dialysis. Stopped with 2 mg IV ativan.  EEG negative. Per neuro seizure likely d/t underlying stroke. 3. Diverticulitis of L colon - seen on CT.  Schedule for EGD and colonoscopy Monday if stable over weekend. Per GI 3 ESRD: MWF [facility], Plan to return to regular schedule tomorrow. 4 Hypertension: BP/volume ok.  Continue home meds. Close to dry, UF as tolerated.  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  Aranesp 2028m qFri 6. Metabolic Bone Disease - CCa, phos, Mg in goal.  Continue calcitriol. 7. Nutrition - Renal diet w/fluid restrictions. 8. Nausea - on antiemetics.   Jean MowPA-C [facility] 10/30/2022,11:45 AM  LOS: 5 days

## 2022-10-30 NOTE — Plan of Care (Signed)

## 2022-10-30 NOTE — Progress Notes (Signed)
Patient had around 5 mins of seizure like activity.She was twitching her head and was not able to speak.Inj midazolam '2mg'$  administered.Vitals monitored and is stable(Charted at 1600).Dr. Thurnell Lose aware.

## 2022-10-30 NOTE — Progress Notes (Addendum)
Name: Jean Mcclain. Pelzel DOB: 10/02/1946 DOA: 10/24/2022 PCP: No assigned PCP CC: Body shaking  Subjective:  Overnight: NAEON.   States she feels nauseous. The nausea medications helps but her nausea comes back. No other acute concerns endorsed.   Objective:  Vital signs in last 24 hours: Vitals:   10/29/22 1700 10/29/22 1725 10/29/22 1844 10/29/22 2311  BP: (!) 111/54 (!) 104/54 (!) 141/56 139/76  Pulse: (!) 52 (!) 47 68 61  Resp: '14 13 20 20  '$ Temp:  97.7 F (36.5 C)  97.9 F (36.6 C)  TempSrc:  Oral  Oral  SpO2: 100% 96% 94% 98%  Weight:      Height:       Supplemental O2: Nasal Cannula Last BM Date : 10/29/22 SpO2: 98 % O2 Flow Rate (L/min): 3 L/min Filed Weights   10/27/22 1245 10/27/22 1710 10/29/22 1300  Weight: 70.9 kg 67.2 kg 62.7 kg    Intake/Output Summary (Last 24 hours) at 10/30/2022 0606 Last data filed at 10/29/2022 1725 Gross per 24 hour  Intake --  Output 1900 ml  Net -1900 ml    Net IO Since Admission: -4,198.75 mL [10/30/22 0606] Physical Exam  General: NAD, laying comfortably in bed.  HENT: NCAT  Lungs:  CTAB Cardiovascular: NSR, good pulses Abdomen: normal bowel sounds, TTP in abdomen generalized.  MSK: No asymmetry. moving all extremities Neuro: alert and oriented Psych: normal mood and normal affect  Diagnostics    Latest Ref Rng & Units 10/30/2022    2:33 AM 10/29/2022   10:29 AM 10/29/2022    4:18 AM  CBC  WBC 4.0 - 10.5 K/uL 5.0  5.0  4.2   Hemoglobin 12.0 - 15.0 g/dL 8.7  8.0  8.1   Hematocrit 36.0 - 46.0 % 27.4  26.2  26.1   Platelets 150 - 400 K/uL 72  65  68        Latest Ref Rng & Units 10/30/2022    2:33 AM 10/29/2022    4:18 AM 10/28/2022    5:07 AM  CMP  Glucose 70 - 99 mg/dL 105  69  66   BUN 8 - 23 mg/dL '11  22  16   '$ Creatinine 0.44 - 1.00 mg/dL 2.17  3.62  2.94   Sodium 135 - 145 mmol/L 132  133  134   Potassium 3.5 - 5.1 mmol/L 3.7  4.2  3.8   Chloride 98 - 111 mmol/L 97  96  97   CO2 22 - 32 mmol/L '26  26   28   '$ Calcium 8.9 - 10.3 mg/dL 7.9  8.2  7.8     Assessment/Plan: Jean Mcclain is a 77 y.o. female with medical history significant of hypertension, hyperlipidemia, diabetes mellitus type II, ESRD on HD(M/W/F), chronic respiratory failure with hypoxia on 3 L of oxygen at night, Parkinson's disease, history of DVT not on anticoagulation, and wheelchair-bound due to left ankle fracture who presents for worsening shaking and jerking found to have seizures.  Principal Problem:   Symptomatic anemia Active Problems:   Diabetes mellitus due to underlying condition, controlled, with stage 2 chronic kidney disease, with long-term current use of insulin (HCC)   Kidney lesion   ESRD on hemodialysis (HCC)   Acute on chronic respiratory failure with hypoxia (HCC)   History of DVT (deep vein thrombosis)   Acute diverticulitis   Nausea   Jerking   Thrombocytopenia (HCC)   Parkinson's disease   Abnormal TSH   Abnormal  CT scan, gastrointestinal tract   Abnormal movements   Anemia  Symptomatic acute on chronic anemia.  Possible occult GI blood loss with recent history of diverticulitis and pending outpatient colonoscopy.   Hgb stable.Workup negative for DIC, LDH stable ruling out any intravascular hemolysis, she is s/p 1 unit of packed RBC transfusion on 10/24/2022 in the ER, she is on PPI, of note she is currently being worked for possible diverticular mass versus persistent diverticulitis and is due for colonoscopy.  Defer further management of GI issues to GI team.  Continue to monitor H&H.  Defer any antibiotic to the GI team as well.  GI planning EGD and colonoscopy Monday. Abdominal exam shows some TTP which may be due to nausea and retching.  For her nausea, will get EKG to ensure QTc is within normal limits and treat with antiemetics as needed.   New onset seizures 2 episodes in the hospital both witnessed. Improved. EEG removed. EEG was non acute, head CT negative, placed on scheduled Keppra along  with as needed Ativan for breakthrough seizure, neuro on board, MRI brain noted with old stroke and some nonspecific neck findings.  Case discussed with neurology on 10/27/2022 continue on Keppra and monitor.    ESRD on HD.  Patient on MWF schedule.  Completed HD yesterday with 1.9 L output. Nephrology on board.  Incidental Neck Mass  Noted on MRI. CT soft tissue neck shows mass that is most likely benign and most consistent with dermal inclusion cyst.  -PCP to monitor outpatient  Acute on chronic hypoxic respiratory failure with underlying OSA.  Resolved. At home on 3 L and here on 2-3 L of oxygen.  Likely increased initially due to low hgb.    History of DVT in 2018.  Not on anticoagulation anymore, repeat lower extremity venous ultrasound ordered during admission remains negative.   History of Parkinson's disease.  Continue home regimen.   History of sick euthyroid syndrome.  Stable TSH now.   Mild persistent thrombocytopenia.  Up-trending. Will continue to monitor. Not on heparin. DIC panel negative and smear showed no schistocytes. On keppra which can cause thrombocytopenia. Will continue to monitor closely with daily labs.    Kidney lesion - Noted to be increased in size on CT scan from 07/2022 for which MRI of the abdomen recommended in 6 months at that time, PCP to schedule it for April 2024.   DM type II.  A1c is 6.9 in 07/2022 . Sliding scale discontinued given hypoglycemia yesterday. CBG within range.   Diet: Soft Diet IVF: None, VTE: SCDs Code: DNR PT/OT recs: pending Prior to Admission Living Arrangement: ALF Anticipated Discharge Location: TBD likely ALF Barriers to Discharge: Medical stability Dispo: Anticipated discharge in approximately 1-2 day(s).   Idamae Schuller, MD Tillie Rung. [facility] Internal Medicine Residency, PGY-2  Pager: [phone] After 5 pm and on weekends: Please call the on-call pager    Attestation   I have directly reviewed the  clinical findings, lab results and imaging studies. I have interviewed and examined the patient and agree with the documentation and management as recorded by Dr.Khan, mild nausea but seizure-free overall for the last 48 hours, may require EGD plus minus colonoscopy/flex sig for evaluation of ongoing anemia and persistent nausea.

## 2022-10-30 NOTE — Anesthesia Preprocedure Evaluation (Deleted)
Anesthesia Evaluation    Reviewed: Allergy & Precautions, Patient's Chart, lab work & pertinent test results  Airway        Dental   Pulmonary sleep apnea and Oxygen sleep apnea , former smoker          Cardiovascular hypertension, + DVT    TTE 2023  1. Left ventricular ejection fraction, by estimation, is 60 to 65%. The  left ventricle has normal function. The left ventricle has no regional  wall motion abnormalities. There is mild left ventricular hypertrophy.  Left ventricular diastolic parameters  are consistent with Grade I diastolic dysfunction (impaired relaxation).   2. Right ventricular systolic function is normal. The right ventricular  size is normal. Tricuspid regurgitation signal is inadequate for assessing  PA pressure.   3. Left atrial size was moderately dilated.   4. Right atrial size was mildly dilated.   5. Trivial mitral valve regurgitation.   6. There is moderate calcification of the aortic valve. Aortic valve  regurgitation is not visualized. Mild aortic valve stenosis.   7. The inferior vena cava is normal in size with greater than 50%  respiratory variability, suggesting right atrial pressure of 3 mmHg.     Neuro/Psych  PSYCHIATRIC DISORDERS Anxiety     negative neurological ROS     GI/Hepatic Neg liver ROS,GERD  ,,  Endo/Other  diabetes, Type 2    Renal/GU ESRF and DialysisRenal diseaseLab Results      Component                Value               Date                      CREATININE               2.17 (H)            10/30/2022                BUN                      11                  10/30/2022                NA                       132 (L)             10/30/2022                K                        3.7                 10/30/2022                CL                       97 (L)              10/30/2022                CO2                      26  10/30/2022             negative  genitourinary   Musculoskeletal negative musculoskeletal ROS (+)    Abdominal   Peds  Hematology  (+) Blood dyscrasia, anemia Lab Results      Component                Value               Date                      WBC                      5.0                 10/30/2022                HGB                      8.7 (L)             10/30/2022                HCT                      27.4 (L)            10/30/2022                MCV                      88.7                10/30/2022                PLT                      72 (L)              10/30/2022              Anesthesia Other Findings   Reproductive/Obstetrics                             Anesthesia Physical Anesthesia Plan  ASA: 3  Anesthesia Plan: MAC   Post-op Pain Management:    Induction: Intravenous  PONV Risk Score and Plan: Propofol infusion and Treatment may vary due to age or medical condition  Airway Management Planned: Natural Airway  Additional Equipment:   Intra-op Plan:   Post-operative Plan:   Informed Consent: I have reviewed the patients History and Physical, chart, labs and discussed the procedure including the risks, benefits and alternatives for the proposed anesthesia with the patient or authorized representative who has indicated his/her understanding and acceptance.     Dental advisory given  Plan Discussed with: CRNA  Anesthesia Plan Comments: (Case cancelled by proceduralist 2/2 seizures prior to being brought to endo suite)       Anesthesia Quick Evaluation

## 2022-10-31 ENCOUNTER — Encounter (HOSPITAL_COMMUNITY)
Admission: EM | Disposition: A | Discharge status: Hospice | Payer: Self-pay | Source: Skilled Nursing Facility | Attending: Internal Medicine

## 2022-10-31 ENCOUNTER — Inpatient Hospital Stay (HOSPITAL_COMMUNITY): Payer: PPO

## 2022-10-31 DIAGNOSIS — G40109 Localization-related (focal) (partial) symptomatic epilepsy and epileptic syndromes with simple partial seizures, not intractable, without status epilepticus: Secondary | ICD-10-CM | POA: Diagnosis not present

## 2022-10-31 DIAGNOSIS — D649 Anemia, unspecified: Secondary | ICD-10-CM | POA: Diagnosis not present

## 2022-10-31 LAB — RENAL FUNCTION PANEL
Albumin: 2.4 g/dL — ABNORMAL LOW (ref 3.5–5.0)
Anion gap: 7 (ref 5–15)
BUN: 19 mg/dL (ref 8–23)
CO2: 28 mmol/L (ref 22–32)
Calcium: 8 mg/dL — ABNORMAL LOW (ref 8.9–10.3)
Chloride: 98 mmol/L (ref 98–111)
Creatinine, Ser: 3.19 mg/dL — ABNORMAL HIGH (ref 0.44–1.00)
GFR, Estimated: 15 mL/min — ABNORMAL LOW (ref >60)
Glucose, Bld: 95 mg/dL (ref 70–99)
Phosphorus: 3.1 mg/dL (ref 2.5–4.6)
Potassium: 3.9 mmol/L (ref 3.5–5.1)
Sodium: 133 mmol/L — ABNORMAL LOW (ref 135–145)

## 2022-10-31 LAB — TSH: TSH: 0.722 u[IU]/mL (ref 0.350–4.500)

## 2022-10-31 LAB — GLUCOSE, CAPILLARY
Glucose-Capillary: 100 mg/dL — ABNORMAL HIGH (ref 70–99)
Glucose-Capillary: 111 mg/dL — ABNORMAL HIGH (ref 70–99)
Glucose-Capillary: 107 mg/dL — ABNORMAL HIGH (ref 70–99)
Glucose-Capillary: 99 mg/dL (ref 70–99)

## 2022-10-31 LAB — C-REACTIVE PROTEIN: CRP: 2.1 mg/dL — ABNORMAL HIGH (ref <1.0)

## 2022-10-31 SURGERY — CANCELLED PROCEDURE
Anesthesia: Monitor Anesthesia Care

## 2022-10-31 MED ORDER — LORAZEPAM 2 MG/ML IJ SOLN
INTRAMUSCULAR | Status: AC
  Administered 2022-10-31: 2 mg
  Filled 2022-10-31: qty 1

## 2022-10-31 MED ORDER — DIVALPROEX SODIUM 500 MG PO DR TAB
500.0000 mg | DELAYED_RELEASE_TABLET | Freq: Two times a day (BID) | ORAL | Status: DC
  Administered 2022-11-01 – 2022-11-02 (×3): 500 mg via ORAL
  Filled 2022-10-31 (×5): qty 1

## 2022-10-31 MED ORDER — LACOSAMIDE 50 MG PO TABS: 50.0000 mg | ORAL_TABLET | Freq: Two times a day (BID) | ORAL | Status: DC

## 2022-10-31 MED ORDER — VALPROATE SODIUM 100 MG/ML IV SOLN
500.0000 mg | Freq: Two times a day (BID) | INTRAVENOUS | Status: DC
  Administered 2022-11-02 – 2022-11-03 (×2): 500 mg via INTRAVENOUS
  Filled 2022-10-31 (×8): qty 5

## 2022-10-31 MED ORDER — SODIUM CHLORIDE 0.9 % IV SOLN
50.0000 mg | Freq: Two times a day (BID) | INTRAVENOUS | Status: DC
  Filled 2022-10-31: qty 5

## 2022-10-31 MED ORDER — SODIUM CHLORIDE 0.9 % IV SOLN
250.0000 mg | INTRAVENOUS | Status: DC
  Administered 2022-10-31: 250 mg via INTRAVENOUS
  Filled 2022-10-31 (×2): qty 2.5

## 2022-10-31 MED ORDER — SODIUM CHLORIDE 0.9 % IV SOLN
200.0000 mg | INTRAVENOUS | Status: AC
  Administered 2022-10-31: 200 mg via INTRAVENOUS
  Filled 2022-10-31: qty 20

## 2022-10-31 SURGICAL SUPPLY — 15 items

## 2022-10-31 NOTE — Progress Notes (Addendum)
Daily Rounding Note  10/31/2022, 1:09 PM  LOS: 6 days   SUBJECTIVE:   Chief complaint:      Had seizure disorder around 6 AM this morning.  Dr. Silverio Decamp canceled the EGD.  OBJECTIVE:         Vital signs in last 24 hours:    Temp:  [98.2 F (36.8 C)-98.6 F (37 C)] 98.2 F (36.8 C) (01/22 0400) Pulse Rate:  [54-94] 71 (01/22 0944) Resp:  [13-25] 17 (01/22 0944) BP: (106-169)/(51-93) 128/53 (01/22 0944) SpO2:  [84 %-100 %] 98 % (01/22 0944) Last BM Date : 10/31/22 Filed Weights   10/27/22 1245 10/27/22 1710 10/29/22 1300  Weight: 70.9 kg 67.2 kg 62.7 kg   Not re-examined.    Intake/Output from previous day: 01/21 0701 - 01/22 0700 In: 3 [I.V.:3] Out: -   Intake/Output this shift: No intake/output data recorded.  Lab Results: Recent Labs    10/29/22 0418 10/29/22 1029 10/30/22 0233  WBC 4.2 5.0 5.0  HGB 8.1* 8.0* 8.7*  HCT 26.1* 26.2* 27.4*  PLT 68* 65* 72*   BMET Recent Labs    10/29/22 0418 10/30/22 0233 10/31/22 0252  NA 133* 132* 133*  K 4.2 3.7 3.9  CL 96* 97* 98  CO2 '26 26 28  '$ GLUCOSE 69* 105* 95  BUN '22 11 19  '$ CREATININE 3.62* 2.17* 3.19*  CALCIUM 8.2* 7.9* 8.0*   LFT Recent Labs    10/29/22 0418 10/30/22 0233 10/31/22 0252  ALBUMIN 2.5* 2.5* 2.4*   PT/INR No results for input(s): "LABPROT", "INR" in the last 72 hours. Hepatitis Panel No results for input(s): "HEPBSAG", "HCVAB", "HEPAIGM", "HEPBIGM" in the last 72 hours.  Studies/Results: DG Abd Portable 1V  Result Date: 10/30/2022 CLINICAL DATA:  Nausea and vomiting. EXAM: PORTABLE ABDOMEN - 1 VIEW COMPARISON:  10/24/2022 FINDINGS: No pathologic dilatation of the large or small bowel loops identified. There is gas noted within the colon and rectum. Benign calcification within the right pelvis is again noted. IMPRESSION: Nonobstructive bowel gas pattern. Electronically Signed   By: Kerby Moors M.D.   On: 10/30/2022 07:38     ASSESMENT:   Nausea, vomiting.  Plans for EGD/flex sig have been canceled last week and again this morning due to seizure-like activity.  CT changes, rule out mass lesion in the left colon.  Given her significant nausea, vomiting, flex sig favored over colonoscopy.     PLAN     Will  recheck on pt tmrw.  If seizure free plan EGD and flex sig on Wed.  Agree w resuming soft diet.      Azucena Freed  10/31/2022, 1:09 PM Phone [phone]   Attending physician's note   I have taken a history, reviewed the chart and examined the patient. I performed a substantive portion of this encounter, including complete performance of at least one of the key components, in conjunction with the APP. I agree with the APP's note, impression and recommendations.    Patient had multiple seizures within the past 24 hours, currently on continuous EEG  Has inflammatory stranding associated with diverticulosis in the left colon, unchanged from prior CT in December 2023.  No definite mass lesion to suggest neoplasia Will hold off endoscopic evaluation at this point Conservative management with symptom control for nausea and vomiting  Will arrange for outpatient GI follow-up  Please call GI with any change in clinical status     K. Denzil Magnuson ,  MD [phone]

## 2022-10-31 NOTE — Progress Notes (Signed)
LTM hook up with non MRI leads. [facility] monitoring with test button tested.

## 2022-10-31 NOTE — TOC Initial Note (Signed)
Transition of Care [facility]) - Initial/Assessment Note    Patient Details  Name: Jean Mcclain MRN: [id number] Date of Birth: [birth year]  [facility]) CM/SW Contact:    Benard Halsted, LCSW Phone Number: 10/31/2022, 9:29 AM  Clinical Narrative:                 CSW spoke with Bailey Mech at [facility]. She confirmed that patient has mod assist for transfers as well as increased supervision as recommended by therapy. CSW will follow for oxygen needs and touch base with RN Chrys Racer [phone]) at [place].  Expected Discharge Plan: Assisted Living Barriers to Discharge: Continued Medical Work up   Patient Goals and CMS Choice Patient states their goals for this hospitalization and ongoing recovery are:: Return to ALF CMS Medicare.gov Compare Post Acute Care list provided to:: Patient Choice offered to / list presented to : Patient [place] ownership interest in [facility].provided to:: Patient    Expected Discharge Plan and Services In-house Referral: Clinical Social Work   Post Acute Care Choice: [place] arrangements for the past 2 months: [place]                                      Prior Living Arrangements/Services Living arrangements for the past 2 months: [place] Lives with:: Facility Resident Patient language and need for interpreter reviewed:: Yes Do you feel safe going back to the place where you live?: Yes      Need for Family Participation in Patient Care: Yes (Comment) Care giver support system in place?: Yes (comment) Current home services: DME Criminal Activity/Legal Involvement Pertinent to Current Situation/Hospitalization: No - Comment as needed  Activities of Daily Living Home Assistive Devices/Equipment: None ADL Screening (condition at time of admission) Patient's cognitive ability adequate to safely complete daily activities?: No Is the patient deaf or have difficulty hearing?:  No Does the patient have difficulty seeing, even when wearing glasses/contacts?: No Does the patient have difficulty concentrating, remembering, or making decisions?: Yes Patient able to express need for assistance with ADLs?: Yes Does the patient have difficulty dressing or bathing?: Yes Independently performs ADLs?: Yes (appropriate for developmental age) Does the patient have difficulty walking or climbing stairs?: Yes Weakness of Legs: Both Weakness of Arms/Hands: Both  Permission Sought/Granted Permission sought to share information with : Facility Sport and exercise psychologist, Family Supports Permission granted to share information with : Yes, Verbal Permission Granted  Share Information with NAME: Ilona Sorrel  Permission granted to share info w AGENCY: [facility]  Permission granted to share info w Relationship: Brother  Permission granted to share info w Contact Information: [phone]  Emotional Assessment Appearance:: Appears stated age     Orientation: : Oriented to Self, Oriented to Place, Oriented to Situation Alcohol / Substance Use: Not Applicable Psych Involvement: No (comment)  Admission diagnosis:  Anemia [D64.9] ESRD (end stage renal disease) on dialysis ([place]) [N18.6, Z99.2] Symptomatic anemia [D64.9] Abnormal movements [R25.9] Patient Active Problem List   Diagnosis Date Noted   Abnormal movements 10/25/2022   Anemia 10/25/2022   Symptomatic anemia 10/24/2022   Jerking 10/24/2022   Thrombocytopenia ([place]) 10/24/2022   Parkinson's disease 10/24/2022   Abnormal TSH 10/24/2022   Abnormal CT scan, gastrointestinal tract 10/24/2022   Palliative care encounter 10/18/2022   Syncope 07/28/2022   Nausea    Food aversion    Blurry  vision, bilateral 07/22/2022   Acute diverticulitis 07/22/2022   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 06/22/2022   Other constipation 02/12/2022   Obstipation 02/12/2022   Pressure injury of back, stage 1 01/16/2022   Thoracic spine  fracture ([place]) 01/09/2022   Hyperkalemia 01/09/2022   Anxiety 01/09/2022   History of DVT (deep vein thrombosis) 01/09/2022   Gout 01/09/2022   Postmenopausal vaginal bleeding    Acute blood loss anemia 06/25/2020   Acute on chronic respiratory failure with hypoxia ([place]) 06/25/2020   Advanced care planning/counseling discussion    Goals of care, counseling/discussion    Obstructive sleep apnea    Primary osteoarthritis, left ankle and foot    Ankle fracture 06/14/2020   HLD (hyperlipidemia) 06/14/2020   Dyspnea, unspecified 02/05/2020   Intractable pain 01/24/2020   Bilateral shoulder pain 01/23/2020   Right knee pain 01/23/2020   Uncontrolled type 2 diabetes mellitus with hyperglycemia, with long-term current use of insulin ([place]) 01/23/2020   Pressure injury of skin 01/22/2020   Mild protein-calorie malnutrition ([place]) 12/16/2019   Personal history of anaphylaxis 12/05/2019   Pruritus, unspecified 12/05/2019   ESRD on hemodialysis ([place]) 11/20/2019   Myoclonus, segmental 11/20/2019   Allergy, unspecified, initial encounter 07/29/2019   Anemia in chronic kidney disease 07/15/2019   Coagulation defect, unspecified ([place]) 07/13/2019   Iron deficiency anemia, unspecified 07/11/2019   Encounter for immunization 07/06/2019   End stage renal disease ([place]) 07/02/2019   GERD (gastroesophageal reflux disease) 07/02/2019   Low back pain 07/02/2019   BMI 34.0-34.9,adult 07/02/2019   Other specified disorders of bone density and structure, unspecified site 07/02/2019   Secondary hyperparathyroidism of renal origin ([place]) 07/02/2019   Unspecified osteoarthritis, unspecified site 07/02/2019   OSA (obstructive sleep apnea) 03/13/2019   Chronic acquired lymphedema 11/05/2018   Kidney lesion 03/30/2017   Diabetes mellitus due to underlying condition, controlled, with stage 2 chronic kidney disease, with long-term current use of insulin ([place]) 03/11/2017   Essential hypertension 03/11/2017    Impingement syndrome of left ankle 01/31/2017   Posterior tibial tendinitis, left leg 01/31/2017   Incarcerated ventral hernia 01/02/2013   PCP:  Pcp, No Pharmacy:   [place], [place] - [address] [place] [address] Phone: [phone] Fax: [place] [phone] [place], [place] - [place] [address] [place] [address] Phone: [phone] Fax: [phone]  [facility] - [place], [place] - [address], [address] [place] [address] Phone: [phone] Fax: [phone]  [place], [place] [address] [place] [address] Phone: [phone] Fax: [phone]  [facility] [place], [place] - [address] [place] [address] Phone: [phone] Fax: [phone]     Social Determinants of Health (SDOH) Social History: [place]: No Food Insecurity (10/25/2022)  Housing: Low Risk  (10/25/2022)  Transportation Needs: No Transportation Needs (10/25/2022)  Utilities: Not At Risk (10/25/2022)  Depression (PHQ2-9): Low Risk  (12/06/2019)  Tobacco Use: Medium Risk (10/25/2022)   SDOH Interventions:     Readmission Risk Interventions    10/31/2022    9:27 AM  Readmission Risk Prevention Plan  Transportation Screening Complete  Medication Review ([place]) Complete  PCP or Specialist appointment within 3-5 days of discharge Complete  HRI or [place] Complete  SW  Recovery Care/Counseling Consult Complete  Palliative Care Screening Not Applicable  Skilled Nursing Facility Not Applicable

## 2022-10-31 NOTE — Progress Notes (Signed)
While EEG tech placing leads on pt, pt began to have a seizure with jerking in head and arms. [name] seizure was about 2 minutes when this nurse arrived in the room. Pt only had order to give versed for seizure more than 5 minutes. Dr. Candiss Norse was in department, give verbal order to give Ativan '2mg'$  IVP. When entering back into room, pt stopped seizing. Shortly after pt between agonal breathing and lost pulse. This nurse not knowing pt's code status at the moment and pt without DNR armband on, begin compression and code blue activated. After one round of CPR, pt regained pulse and rhythm noted. Code team present in room. Rapid response RN made team aware of pt's DNR code status. DNR armband applied immediately. Staff educated on importance of ensuring that all DNR pt need to have armband placed.

## 2022-10-31 NOTE — Progress Notes (Addendum)
Name: Jean Mcclain. [place] DOB: October 06, 1946 DOA: 10/24/2022 PCP: No assigned PCP CC: Body shaking  Subjective:  Overnight: Seizure like episode yesterday and overnight both requiring IV versed for termination  Pt alert and oriented this am. Appeared concerned about her seizure activity and the cause of it. No acute concerns endorsed.   Objective:  Vital signs in last 24 hours: Vitals:   10/31/22 0100 10/31/22 0200 10/31/22 0300 10/31/22 0400  BP:    (!) 117/52  Pulse: (!) 55 65 (!) 54 60  Resp: '15 18 15 14  '$ Temp:    98.2 F (36.8 C)  TempSrc:    Oral  SpO2: 99% 97% 100% 96%  Weight:      Height:       Supplemental O2: Nasal Cannula Last BM Date : 10/29/22 SpO2: 96 % O2 Flow Rate (L/min): 2 L/min Filed Weights   10/27/22 1245 10/27/22 1710 10/29/22 1300  Weight: 70.9 kg 67.2 kg 62.7 kg    Intake/Output Summary (Last 24 hours) at 10/31/2022 0630 Last data filed at 10/30/2022 2228 Gross per 24 hour  Intake 3 ml  Output --  Net 3 ml    Net IO Since Admission: -4,195.75 mL [10/31/22 0630] Physical Exam  General: NAD, laying comfortably in bed.  HENT: NCAT  Lungs:  CTAB Cardiovascular: NSR, good pulses Abdomen: normal bowel sounds MSK: No asymmetry. moving all extremities Neuro: alert and oriented x4 Psych: normal mood and normal affect  Diagnostics    Latest Ref Rng & Units 10/30/2022    2:33 AM 10/29/2022   10:29 AM 10/29/2022    4:18 AM  CBC  WBC 4.0 - 10.5 K/uL 5.0  5.0  4.2   Hemoglobin 12.0 - 15.0 g/dL 8.7  8.0  8.1   Hematocrit 36.0 - 46.0 % 27.4  26.2  26.1   Platelets 150 - 400 K/uL 72  65  68        Latest Ref Rng & Units 10/31/2022    2:52 AM 10/30/2022    2:33 AM 10/29/2022    4:18 AM  CMP  Glucose 70 - 99 mg/dL 95  105  69   BUN 8 - 23 mg/dL '19  11  22   '$ Creatinine 0.44 - 1.00 mg/dL 3.19  2.17  3.62   Sodium 135 - 145 mmol/L 133  132  133   Potassium 3.5 - 5.1 mmol/L 3.9  3.7  4.2   Chloride 98 - 111 mmol/L 98  97  96   CO2 22 - 32 mmol/L  '28  26  26   '$ Calcium 8.9 - 10.3 mg/dL 8.0  7.9  8.2     Assessment/Plan: DYLIN IHNEN is a 77 y.o. female with medical history significant of hypertension, hyperlipidemia, diabetes mellitus type II, ESRD on HD(M/W/F), chronic respiratory failure with hypoxia on 3 L of oxygen at night, Parkinson's disease, history of DVT not on anticoagulation, and wheelchair-bound due to left ankle fracture who presents for worsening shaking and jerking found to have seizures.  Principal Problem:   Symptomatic anemia Active Problems:   Diabetes mellitus due to underlying condition, controlled, with stage 2 chronic kidney disease, with long-term current use of insulin (HCC)   Kidney lesion   ESRD on hemodialysis (HCC)   Acute on chronic respiratory failure with hypoxia (HCC)   History of DVT (deep vein thrombosis)   Acute diverticulitis   Nausea   Jerking   Thrombocytopenia (HCC)   Parkinson's disease   Abnormal  TSH   Abnormal CT scan, gastrointestinal tract   Abnormal movements   Anemia  Symptomatic acute on chronic anemia.  Possible occult GI blood loss with recent history of diverticulitis and pending outpatient colonoscopy.   Hgb stable.Workup negative for DIC, LDH stable ruling out any intravascular hemolysis, she is s/p 1 unit of packed RBC transfusion on 10/24/2022 in the ER, she is on PPI, of note she is currently being worked for possible diverticular mass versus persistent diverticulitis and is due for colonoscopy.  Defer further management of GI issues to GI team.  Continue to monitor H&H.  Defer any antibiotic to the GI team as well.  GI had plans for EGD and colonoscopy today but will defer for one day until neurology can evaluate the patient prior to her endoscopies.    New onset seizures 2 episodes in the hospital both witnessed.  Pt with initial improvement and having multiple days without any seizure like activity but had 3 since yesterday. Neurology re-consulted. EEG removed. EEG was  non acute, head CT negative, placed on scheduled Keppra along with as needed Ativan for breakthrough seizure, neuro on board, MRI brain noted with old stroke and some nonspecific neck findings.   ESRD on HD.  Patient on MWF schedule. Nephrology on board and following. HD today.   Incidental Neck Mass  Noted on MRI. CT soft tissue neck shows mass that is most likely benign and most consistent with dermal inclusion cyst.  -PCP to monitor outpatient  Acute on chronic hypoxic respiratory failure with underlying OSA.  Resolved. At home on 3 L and here on 2-3 L of oxygen.  Likely increased initially due to low hgb.    History of DVT in 2018.  Not on anticoagulation anymore, repeat lower extremity venous ultrasound ordered during admission remains negative.   History of Parkinson's disease.  Continue home regimen.   History of sick euthyroid syndrome.  Stable TSH now.   Mild persistent thrombocytopenia.  Up-trending. Will continue to monitor. Not on heparin. DIC panel negative and smear showed no schistocytes. On keppra which can cause thrombocytopenia. Will continue to monitor closely with daily labs.    Kidney lesion - Noted to be increased in size on CT scan from 07/2022 for which MRI of the abdomen recommended in 6 months at that time, PCP to schedule it for April 2024.   DM type II.  A1c is 6.9 in 07/2022 . Sliding scale discontinued given hypoglycemia yesterday. CBG within range.   Diet: Soft Diet IVF: None, VTE: SCDs Code: DNR PT/OT recs: pending Prior to Admission Living Arrangement: ALF Anticipated Discharge Location: TBD likely ALF Barriers to Discharge: Medical stability Dispo: Anticipated discharge in approximately 1-2 day(s).   Idamae Schuller, MD Tillie Rung. [facility] Internal Medicine Residency, PGY-2  Pager: [phone] After 5 pm and on weekends: Please call the on-call pager     Attestation -   I have directly reviewed the clinical findings, lab results and  imaging studies. I have interviewed and examined the patient and agree with the documentation and management as recorded by the Dr Humphrey Rolls, patient admitted for new onset seizures along with subacute GI blood loss anemia, currently H&H stable after transfusion, GI following still having breakthrough seizures, case discussed with neurology they will follow and adjust AEDs as needed.

## 2022-10-31 NOTE — Progress Notes (Signed)
@  2330- NT called RN to bedside for seizure activity. Pt not responsive to voice, rhythmic jerking of head and arms. PRN IV versed was given with immediate relief of seizure activity. VSS throughout episode. Pt able to converse post ictally and follow commands. MD made aware. Continuing with current plan of care.

## 2022-10-31 NOTE — Progress Notes (Signed)
Pt had order for urine analysis.In and out attempted twice.Got no any urine output.Bladder scanning done and is 0.MD aware

## 2022-10-31 NOTE — Progress Notes (Signed)
Rapid Response Note  Responded to Code Blue Event call. Arrived to find patient with a pulse, reported post seizure lasting at least 2 minutes. No medications given per RN and MD at bedside. No RRT interventions. Confirmed patient code status is DNR, arm band placed.   Please note: crash cart was opened, drawers were in tact; verified with Pharmacist, notified for new clean lock. Zoll pads opened, never placed on patient, new set ordered.   Candace Cruise RN Rapid Response

## 2022-10-31 NOTE — Progress Notes (Addendum)
Subjective: Has had 5 seizures since yesterday evening after receiving Keppra.  I did witness 1 seizure around 930 this morning.  When I entered the room patient had left gaze deviation, getting chest compressions.  After seizure ended, patient was able to tell me her name and follow simple commands like sticking out her tongue.  EEG tech was in the room holding of the patient and said patient was able to tell him that she feels a seizure coming on. The video is not available for review yet.   ROS: unable to obtain due to postictal state  Examination  Vital signs in last 24 hours: Temp:  [98 F (36.7 C)-98.6 F (37 C)] 98.2 F (36.8 C) (01/22 0400) Pulse Rate:  [54-66] 62 (01/22 0800) Resp:  [13-22] 17 (01/22 0800) BP: (117-133)/(52-93) 118/56 (01/22 0800) SpO2:  [96 %-100 %] 98 % (01/22 0800)  General: lying in bed, NAD Neuro: At the beginning, patient had left gaze deviation, was receiving chest compressions.  After the seizure ended, patient was able to tell me her name, all simple commands like sticking out her tongue, did squeeze my fingers in bilateral upper extremities but unable to participate in rest of the exam because of postictal state  Basic Metabolic Panel: Recent Labs  Lab 10/26/22 0834 10/27/22 0253 10/28/22 0507 10/29/22 0418 10/30/22 0233 10/31/22 0252  NA 133* 135 134* 133* 132* 133*  K 3.9 4.1 3.8 4.2 3.7 3.9  CL 99 100 97* 96* 97* 98  CO2 '26 27 28 26 26 28  '$ GLUCOSE 90 66* 66* 69* 105* 95  BUN 24* 24* '16 22 11 19  '$ CREATININE 3.49* 3.84* 2.94* 3.62* 2.17* 3.19*  CALCIUM 8.0* 8.1* 7.8* 8.2* 7.9* 8.0*  MG 1.6* 2.2 1.8 1.8  --   --   PHOS 3.2 3.9 2.7 3.4 2.5 3.1    CBC: Recent Labs  Lab 10/26/22 0834 10/27/22 0253 10/28/22 0507 10/29/22 0418 10/29/22 1029 10/30/22 0233  WBC 4.4 3.9* 4.2 4.2 5.0 5.0  NEUTROABS 3.4 2.8 3.0 3.4  --  4.2  HGB 8.4* 8.2* 8.4* 8.1* 8.0* 8.7*  HCT 27.0* 27.7* 26.7* 26.1* 26.2* 27.4*  MCV 90.9 93.3 90.2 90.0 90.0 88.7   PLT 83* 80* 69* 68* 65* 72*    Coagulation Studies: No results for input(s): "LABPROT", "INR" in the last 72 hours.  Imaging No new brain imaging overnight  ASSESSMENT AND PLAN: 77 year old wheelchair-bound female with history of hypertension, hyperlipidemia, diabetes, end-stage renal disease on hemodialysis parkinsonism, RLS and myoclonus who has been having multiple seizure-like episodes.   New onset seizure Chronic stroke, left parietal Acute encephalopathy, resolved - seizure likely due to underlying stroke.  Continues to have breakthrough seizures even after receiving medication.  Did appear to have a dirty UA on 1/15.  Due to repeated breakthrough seizures, will discuss with medicine team to see if we can get a urine culture to make sure there is no UTI   Recommendations - Will load with IV Vimpat once and start Vimpat 50 mg twice daily - Continue Keppra '500mg'$  BID and '250mg'$  additional dose after HD  - Start LTM EEG - PTN IV versed for seizure lasting over 5 minutes.  - Continue seizure precautions - Management of rest of comorbidities per primary team  ADDENDUM - Heart rate dropping to 40s and PR interval 200 so will switch from vimpat to depakote '500mg'$  BID  I have spent a total of  36 minutes with the patient reviewing hospital notes,  test results, labs and examining the patient as well as establishing an assessment and plan.  > 50% of time was spent in direct patient care.    Zeb Comfort Epilepsy [facility] For questions after 5pm please refer to AMION to reach the Neurologist on call

## 2022-10-31 NOTE — Progress Notes (Signed)
[facility] Progress Note   Subjective:   Seen in room. Had another seizure this am and got chest compressions. Now post-ictal, not responding.   Objective Vitals:   10/31/22 0938 10/31/22 0940 10/31/22 0942 10/31/22 0944  BP: (!) 140/55 (!) 134/52 (!) 130/53 (!) 128/53  Pulse: 74 73 72 71  Resp: 18 (!) '25 19 17  '$ Temp:      TempSrc:      SpO2: 99% 98% 98% 98%  Weight:      Height:       Physical Exam General:chronically ill appearing female in NAD Heart:RRR, no mrg Lungs:CTAB anterolaterally, nml WOB on 2L O2 via [place] Abdomen:soft, NTND Extremities:trace LE edema  Dialysis Access: RU AVF +b/t   Filed Weights   10/27/22 1245 10/27/22 1710 10/29/22 1300  Weight: 70.9 kg 67.2 kg 62.7 kg    Intake/Output Summary (Last 24 hours) at 10/31/2022 1022 Last data filed at 10/30/2022 2228 Gross per 24 hour  Intake 3 ml  Output --  Net 3 ml     Additional Objective Labs: Basic Metabolic Panel: Recent Labs  Lab 10/29/22 0418 10/30/22 0233 10/31/22 0252  NA 133* 132* 133*  K 4.2 3.7 3.9  CL 96* 97* 98  CO2 '26 26 28  '$ GLUCOSE 69* 105* 95  BUN '22 11 19  '$ CREATININE 3.62* 2.17* 3.19*  CALCIUM 8.2* 7.9* 8.0*  PHOS 3.4 2.5 3.1    Liver Function Tests: Recent Labs  Lab 10/29/22 0418 10/30/22 0233 10/31/22 0252  ALBUMIN 2.5* 2.5* 2.4*    CBC: Recent Labs  Lab 10/27/22 0253 10/28/22 0507 10/29/22 0418 10/29/22 1029 10/30/22 0233  WBC 3.9* 4.2 4.2 5.0 5.0  NEUTROABS 2.8 3.0 3.4  --  4.2  HGB 8.2* 8.4* 8.1* 8.0* 8.7*  HCT 27.7* 26.7* 26.1* 26.2* 27.4*  MCV 93.3 90.2 90.0 90.0 88.7  PLT 80* 69* 68* 65* 72*    CBG: Recent Labs  Lab 10/30/22 1211 10/30/22 1607 10/30/22 2205 10/31/22 0822 10/31/22 0931  GLUCAP 104* 100* 105* 100* 111*     Studies/Results: DG Abd Portable 1V  Result Date: 10/30/2022 CLINICAL DATA:  Nausea and vomiting. EXAM: PORTABLE ABDOMEN - 1 VIEW COMPARISON:  10/24/2022 FINDINGS: No pathologic dilatation of the large  or small bowel loops identified. There is gas noted within the colon and rectum. Benign calcification within the right pelvis is again noted. IMPRESSION: Nonobstructive bowel gas pattern. Electronically Signed   By: Kerby Moors M.D.   On: 10/30/2022 07:38    Medications:  albumin human     anticoagulant sodium citrate     lacosamide (VIMPAT) IV     lacosamide (VIMPAT) IV     levETIRAcetam     levETIRAcetam 500 mg (10/31/22 0851)    sodium chloride   Intravenous Once   aspirin  81 mg Oral Daily   atorvastatin  40 mg Oral Daily   bisacodyl  10 mg Rectal Once   buprenorphine  1 patch Transdermal Weekly   calcitRIOL  1.25 mcg Oral Q M,W,F-HD   camphor-menthol  1 Application Topical Daily   carbidopa-levodopa  2 tablet Oral q AM   And   carbidopa-levodopa  1 tablet Oral BID   Chlorhexidine Gluconate Cloth  6 each Topical Q0600   Chlorhexidine Gluconate Cloth  6 each Topical Q0600   Chlorhexidine Gluconate Cloth  6 each Topical Q0600   darbepoetin (ARANESP) injection - DIALYSIS  200 mcg Subcutaneous Q Fri-1800   LORazepam  melatonin  5 mg Oral QHS   midodrine  20 mg Oral Q M,W,F-HD   mirtazapine  15 mg Oral QHS   nystatin  1 Application Topical Daily   pantoprazole (PROTONIX) IV  40 mg Intravenous Q12H   rOPINIRole  12 mg Oral Daily   sodium chloride flush  3 mL Intravenous Q12H   tamsulosin  0.4 mg Oral QHS    Dialysis Orders: [facility] MWF 3:15h  67.9 kg 3K/2Ca  AVF Hep 3000 Mircera 264mg - last given 10/09/22 Venofer 50 qwk   Assessment/Plan: 1. Myoclonus/ jerking: initially thought possibly gabapentin toxicity- CT head negative.  Electrolytes OK.  Stopped gabapentin. Now I'm wondering if all related to #2 MRI  - no acute intracranial findings, incomplete image mass/lesion in left neck. EEG w/mild-mod encephalopathy, non specific etiology. No seizure activity noted.  2 Seizure:  First seizure 1/16, endo cancelled, loaded with Keppra, neuro following.  Another seizure at last  dialysis. Stopped with 2 mg IV ativan.  EEG negative. Per neuro seizure likely d/t underlying stroke. Continues to have breakthrough seizures. Now getting Vimpat load.  3. Diverticulitis of L colon - seen on CT.  Schedule for EGD and colonoscopy once stable.  Per GI 3 ESRD: MWF NW, Back on schedule today.  4 Hypertension: BP/volume ok.  Continue home meds. Close to dry, UF as tolerated.  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  Aranesp 2068m qFri 6. Metabolic Bone Disease - CCa, phos, Mg in goal.  Continue calcitriol. 7. Nutrition - Renal diet w/fluid restrictions. 8. Nausea - on antiemetics.   OgLynnda ChildA-C [facility] 10/31/2022,10:22 AM

## 2022-10-31 NOTE — Progress Notes (Signed)
HD tx delayed due to unable to reach EEG tech multiple attempts by floor RN and HD RN assigned number goes to VM.

## 2022-10-31 NOTE — Telephone Encounter (Signed)
Pt still admitted to the hospital

## 2022-10-31 NOTE — Progress Notes (Signed)
PT Cancellation Note  Patient Details Name: Jean Mcclain MRN: [id number] DOB: May 07, 1946   Cancelled Treatment:    Reason Eval/Treat Not Completed: Medical issues which prohibited therapy Pt actively having seizures and now on continuous EEG. Holding PT today. Will follow.   Marguarite Arbour A Maribell Demeo 10/31/2022, 11:58 AM Marisa Severin, PT, DPT Acute Rehabilitation Services Secure chat preferred Office [phone]

## 2022-10-31 NOTE — Plan of Care (Signed)
  Problem: Education: Goal: Ability to describe self-care measures that may prevent or decrease complications (Diabetes Survival Skills Education) will improve Outcome: Progressing   Problem: Coping: Goal: Ability to adjust to condition or change in health will improve Outcome: Progressing   Problem: Metabolic: Goal: Ability to maintain appropriate glucose levels will improve Outcome: Progressing   Problem: Skin Integrity: Goal: Risk for impaired skin integrity will decrease Outcome: Progressing   Problem: Clinical Measurements: Goal: Will remain free from infection Outcome: Progressing Goal: Diagnostic test results will improve Outcome: Progressing Goal: Respiratory complications will improve Outcome: Progressing Goal: Cardiovascular complication will be avoided Outcome: Progressing   Problem: Coping: Goal: Level of anxiety will decrease Outcome: Progressing   Problem: Pain Managment: Goal: General experience of comfort will improve Outcome: Progressing   Problem: Safety: Goal: Ability to remain free from injury will improve Outcome: Progressing

## 2022-11-01 DIAGNOSIS — G40109 Localization-related (focal) (partial) symptomatic epilepsy and epileptic syndromes with simple partial seizures, not intractable, without status epilepticus: Secondary | ICD-10-CM | POA: Diagnosis not present

## 2022-11-01 DIAGNOSIS — D649 Anemia, unspecified: Secondary | ICD-10-CM | POA: Diagnosis not present

## 2022-11-01 DIAGNOSIS — R569 Unspecified convulsions: Secondary | ICD-10-CM | POA: Diagnosis not present

## 2022-11-01 LAB — CBC
HCT: 26.5 % — ABNORMAL LOW (ref 36.0–46.0)
Hemoglobin: 8.1 g/dL — ABNORMAL LOW (ref 12.0–15.0)
MCH: 27.6 pg (ref 26.0–34.0)
MCHC: 30.6 g/dL (ref 30.0–36.0)
MCV: 90.4 fL (ref 80.0–100.0)
Platelets: 78 10*3/uL — ABNORMAL LOW (ref 150–400)
RBC: 2.93 MIL/uL — ABNORMAL LOW (ref 3.87–5.11)
RDW: 17.9 % — ABNORMAL HIGH (ref 11.5–15.5)
WBC: 5.2 10*3/uL (ref 4.0–10.5)
nRBC: 0 % (ref 0.0–0.2)

## 2022-11-01 LAB — RENAL FUNCTION PANEL
Albumin: 2.5 g/dL — ABNORMAL LOW (ref 3.5–5.0)
Anion gap: 10 (ref 5–15)
BUN: 31 mg/dL — ABNORMAL HIGH (ref 8–23)
CO2: 24 mmol/L (ref 22–32)
Calcium: 8 mg/dL — ABNORMAL LOW (ref 8.9–10.3)
Chloride: 100 mmol/L (ref 98–111)
Creatinine, Ser: 4.11 mg/dL — ABNORMAL HIGH (ref 0.44–1.00)
GFR, Estimated: 11 mL/min — ABNORMAL LOW (ref >60)
Glucose, Bld: 72 mg/dL (ref 70–99)
Phosphorus: 3.6 mg/dL (ref 2.5–4.6)
Potassium: 4 mmol/L (ref 3.5–5.1)
Sodium: 134 mmol/L — ABNORMAL LOW (ref 135–145)

## 2022-11-01 LAB — GLUCOSE, CAPILLARY
Glucose-Capillary: 74 mg/dL (ref 70–99)
Glucose-Capillary: 95 mg/dL (ref 70–99)
Glucose-Capillary: 88 mg/dL (ref 70–99)

## 2022-11-01 MED ORDER — MUSCLE RUB 10-15 % EX CREA
TOPICAL_CREAM | Freq: Three times a day (TID) | CUTANEOUS | Status: DC
  Administered 2022-11-01 – 2022-11-07 (×3): 1 via TOPICAL
  Filled 2022-11-01: qty 85

## 2022-11-01 NOTE — Progress Notes (Signed)
[facility] Progress Note   Subjective:   Seen in room. She's alert. Uncomfortable in the bed. Leg hurts, groin hurts. Seizure again yesterday. EEG -nonspecific. Didn't get dialysis yesterday d/t EEG monitoring - plan for HD off schedule today   Objective Vitals:   11/01/22 0200 11/01/22 0300 11/01/22 0600 11/01/22 0800  BP:    (!) 129/56  Pulse:   65 66  Resp:   18 14  Temp:      TempSrc:      SpO2: 98% 99% 98% 98%  Weight:      Height:       Physical Exam General: chronically ill appearing female in NAD Heart: RRR, no mrg Lungs: CTAB anterolaterally, nml WOB on 2L O2 via [place] Abdomen: soft, NTND Extremities:trace LE edema  Dialysis Access: RU AVF +b/t   Filed Weights   10/27/22 1245 10/27/22 1710 10/29/22 1300  Weight: 70.9 kg 67.2 kg 62.7 kg    Intake/Output Summary (Last 24 hours) at 11/01/2022 1025 Last data filed at 10/31/2022 2247 Gross per 24 hour  Intake 3 ml  Output --  Net 3 ml     Additional Objective Labs: Basic Metabolic Panel: Recent Labs  Lab 10/30/22 0233 10/31/22 0252 11/01/22 0705  NA 132* 133* 134*  K 3.7 3.9 4.0  CL 97* 98 100  CO2 '26 28 24  '$ GLUCOSE 105* 95 72  BUN 11 19 31*  CREATININE 2.17* 3.19* 4.11*  CALCIUM 7.9* 8.0* 8.0*  PHOS 2.5 3.1 3.6    Liver Function Tests: Recent Labs  Lab 10/30/22 0233 10/31/22 0252 11/01/22 0705  ALBUMIN 2.5* 2.4* 2.5*    CBC: Recent Labs  Lab 10/28/22 0507 10/29/22 0418 10/29/22 1029 10/30/22 0233 11/01/22 0705  WBC 4.2 4.2 5.0 5.0 5.2  NEUTROABS 3.0 3.4  --  4.2  --   HGB 8.4* 8.1* 8.0* 8.7* 8.1*  HCT 26.7* 26.1* 26.2* 27.4* 26.5*  MCV 90.2 90.0 90.0 88.7 90.4  PLT 69* 68* 65* 72* 78*    CBG: Recent Labs  Lab 10/31/22 0822 10/31/22 0931 10/31/22 1203 10/31/22 1621 11/01/22 0908  GLUCAP 100* 111* 107* 99 74     Studies/Results: No results found.  Medications:  albumin human     anticoagulant sodium citrate     levETIRAcetam 250 mg (10/31/22 2254)    levETIRAcetam 500 mg (11/01/22 1016)   valproate sodium      sodium chloride   Intravenous Once   aspirin  81 mg Oral Daily   atorvastatin  40 mg Oral Daily   bisacodyl  10 mg Rectal Once   buprenorphine  1 patch Transdermal Weekly   calcitRIOL  1.25 mcg Oral Q M,W,F-HD   camphor-menthol  1 Application Topical Daily   carbidopa-levodopa  2 tablet Oral q AM   And   carbidopa-levodopa  1 tablet Oral BID   Chlorhexidine Gluconate Cloth  6 each Topical Q0600   Chlorhexidine Gluconate Cloth  6 each Topical Q0600   Chlorhexidine Gluconate Cloth  6 each Topical Q0600   darbepoetin (ARANESP) injection - DIALYSIS  200 mcg Subcutaneous Q Fri-1800   divalproex  500 mg Oral Q12H   melatonin  5 mg Oral QHS   midodrine  20 mg Oral Q M,W,F-HD   mirtazapine  15 mg Oral QHS   Muscle Rub   Topical TID   nystatin  1 Application Topical Daily   pantoprazole (PROTONIX) IV  40 mg Intravenous Q12H   rOPINIRole  12 mg Oral Daily  sodium chloride flush  3 mL Intravenous Q12H   tamsulosin  0.4 mg Oral QHS    Dialysis Orders: [facility] MWF 3:15h  67.9 kg 3K/2Ca  AVF Hep 3000 Mircera 258mg - last given 10/09/22 Venofer 50 qwk   Assessment/Plan: 1. Myoclonus/ jerking: initially thought possibly gabapentin toxicity- CT head negative.  Electrolytes OK.  Stopped gabapentin. Now I'm wondering if all related to #2 MRI  - no acute intracranial findings, incomplete image mass/lesion in left neck. EEG w/mild-mod encephalopathy, non specific etiology. No seizure activity noted.  2 Seizure:  First seizure 1/16, endo cancelled, loaded with Keppra, neuro following.  Another seizure at last dialysis. Stopped with 2 mg IV ativan.  EEG negative. Per neuro seizure likely d/t underlying stroke. Had breakthrough seizures. Now getting Vimpat load.  3. Diverticulitis of L colon - seen on CT.  Schedule for EGD and colonoscopy once stable.  Per GI 3 ESRD: MWF NW. Off schedule. For HD today 4 Hypertension: BP/volume ok.  Continue  home meds. Close to dry, UF as tolerated.  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  Aranesp 2036m qFri 6. Metabolic Bone Disease - CCa, phos, Mg in goal.  Continue calcitriol. 7. Nutrition - Renal diet w/fluid restrictions. 8. Nausea - on antiemetics.   OgLynnda ChildA-C [facility] 11/01/2022,10:25 AM

## 2022-11-01 NOTE — Progress Notes (Signed)
OT Cancellation Note  Patient Details Name: Jean Mcclain MRN: [id number] DOB: [birth year]   Cancelled Treatment:    Reason Eval/Treat Not Completed: Patient at procedure or test/ unavailable.  Off the floor for HD.    [place] Samford D Powell Halbert 11/01/2022, 1:09 PM 11/01/2022  RP, OTR/L  Acute Rehabilitation Services  Office:  [phone]

## 2022-11-01 NOTE — Progress Notes (Signed)
   11/01/22 1630  Vitals  Temp 98.9 F (37.2 C)  Pulse Rate 67  Resp (!) 23  BP (!) 92/49  SpO2 100 %  O2 Device Nasal Cannula  Oxygen Therapy  O2 Flow Rate (L/min) 3 L/min  During Treatment Monitoring  Blood Flow Rate (mL/min) 400 mL/min  Arterial Pressure (mmHg) -230 mmHg  Venous Pressure (mmHg) 210 mmHg  TMP (mmHg) 14 mmHg  Ultrafiltration Rate (mL/min) 0 mL/min  Dialysate Flow Rate (mL/min) 300 ml/min  HD Safety Checks Performed Yes  Intra-Hemodialysis Comments Tx completed   Received patient in bed to unit.  Alert and oriented.  Informed consent signed and in chart.   Treatment initiated: 1312 Treatment completed: 1630  Patient tolerated well.  Transported back to the room  Alert, without acute distress.  Hand-off given to patient's nurse.   Access used: RUA AVF Access issues: none  Total UF removed: 0.9L Medication(s) given: midodrine '20mg'$     Na'Shaminy T [facility]ian [facility]

## 2022-11-01 NOTE — Progress Notes (Signed)
Pt given midodrine for bp support as ordered

## 2022-11-01 NOTE — TOC Progression Note (Signed)
Transition of Care [facility]) - Progression Note    Patient Details  Name: Jean Mcclain MRN: [id number] Date of Birth: April 20, 1946  Transition of Care [facility]) CM/SW [facility], LCSW Phone Number: 11/01/2022, 10:02 AM  Clinical Narrative:    CSW left voicemail for RN, Chrys Racer at [place].    Expected Discharge Plan: Assisted Living Barriers to Discharge: Continued Medical Work up  Expected Discharge Plan and Services In-house Referral: Clinical Social Work   Post Acute Care Choice: [place] arrangements for the past 2 months: [place]                                       Social Determinants of Health (SDOH) Interventions SDOH Screenings   Food Insecurity: No Food Insecurity (10/25/2022)  Housing: Low Risk  (10/25/2022)  Transportation Needs: No Transportation Needs (10/25/2022)  Utilities: Not At Risk (10/25/2022)  Depression (PHQ2-9): Low Risk  (12/06/2019)  Tobacco Use: Medium Risk (10/25/2022)    Readmission Risk Interventions    10/31/2022    9:27 AM  Readmission Risk Prevention Plan  Transportation Screening Complete  Medication Review ([place]) Complete  PCP or Specialist appointment within 3-5 days of discharge Complete  HRI or [place] Complete  SW Recovery Care/Counseling Consult Complete  [place] Not Applicable

## 2022-11-01 NOTE — Plan of Care (Signed)

## 2022-11-01 NOTE — Procedures (Signed)
Patient Name: ALLEIGH MOLLICA  MRN: [id number]  Epilepsy Attending: Lora Havens  Referring Physician/Provider: Lora Havens, MD  Duration: 10/31/2022 0915 to 11/01/2022 0915   Patient history: 77 year old female with seizure-like episodes.  EEG to evaluate for seizure.   Level of alertness: Awake, asleep   AEDs during EEG study: LEV, VPA   Technical aspects: This EEG study was done with scalp electrodes positioned according to the 10-20 International system of electrode placement. Electrical activity was reviewed with band pass filter of 1-'70Hz'$ , sensitivity of 7 uV/mm, display speed of 57m/sec with a '60Hz'$  notched filter applied as appropriate. EEG data were recorded continuously and digitally stored.  Video monitoring was available and reviewed as appropriate.   Description: The posterior dominant rhythm consists of '8Hz'$  activity of moderate voltage (25-35 uV) seen predominantly in posterior head regions, symmetric and reactive to eye opening and eye closing. Sleep was characterized by vertex waves, sleep spindles (12 to 14 Hz), maximal frontocentral region. EEG showed intermittent generalized predominantly 5 to 7 Hz theta slowing admixed with 2 to 3 Hz generalized delta slowing. Hyperventilation and photic stimulation were not performed.      ABNORMALITY -Intermittent slow, generalized   IMPRESSION: This study is suggestive of mild to moderate diffuse encephalopathy, nonspecific etiology.  No seizures or epileptiform discharges were seen throughout the recording.   Arvel Oquinn [name]

## 2022-11-01 NOTE — Progress Notes (Addendum)
Name: Jean Mcclain. Bartelson DOB: 22-Oct-1945 DOA: 10/24/2022 PCP: No assigned PCP CC: Body shaking  Subjective:  Overnight: NAEON, Pt with multiple seizures yesterday and 1 episode where pt thought to have lost pulse, appears to be 2/2 to vasovagal 2/2 seizure like activity.   Patient states her chest was hurting the same.  Her nausea is better.  No other acute concerns endorsed.  Objective:  Vital signs in last 24 hours: Vitals:   10/31/22 0942 10/31/22 0944 10/31/22 1100 10/31/22 1600  BP: (!) 130/53 (!) 128/53  (!) 111/51  Pulse: 72 71  65  Resp: '19 17  14  '$ Temp:   98 F (36.7 C) 98.2 F (36.8 C)  TempSrc:      SpO2: 98% 98%  100%  Weight:      Height:       Supplemental O2: Nasal Cannula Last BM Date : 10/31/22 SpO2: 100 % O2 Flow Rate (L/min): 2 L/min Filed Weights   10/27/22 1245 10/27/22 1710 10/29/22 1300  Weight: 70.9 kg 67.2 kg 62.7 kg    Intake/Output Summary (Last 24 hours) at 11/01/2022 0630 Last data filed at 10/31/2022 2247 Gross per 24 hour  Intake 3 ml  Output --  Net 3 ml    Net IO Since Admission: -4,192.75 mL [11/01/22 0630] Physical Exam  General: NAD, laying comfortably in bed.  HENT: NCAT, EEG in place Lungs:  CTAB Cardiovascular: NSR, good pulses Abdomen: normal bowel sounds MSK: No asymmetry. moving all extremities, TTP of chest wall.  Neuro: alert and oriented x4 Psych: normal mood and normal affect  Diagnostics    Latest Ref Rng & Units 10/30/2022    2:33 AM 10/29/2022   10:29 AM 10/29/2022    4:18 AM  CBC  WBC 4.0 - 10.5 K/uL 5.0  5.0  4.2   Hemoglobin 12.0 - 15.0 g/dL 8.7  8.0  8.1   Hematocrit 36.0 - 46.0 % 27.4  26.2  26.1   Platelets 150 - 400 K/uL 72  65  68        Latest Ref Rng & Units 10/31/2022    2:52 AM 10/30/2022    2:33 AM 10/29/2022    4:18 AM  CMP  Glucose 70 - 99 mg/dL 95  105  69   BUN 8 - 23 mg/dL '19  11  22   '$ Creatinine 0.44 - 1.00 mg/dL 3.19  2.17  3.62   Sodium 135 - 145 mmol/L 133  132  133    Potassium 3.5 - 5.1 mmol/L 3.9  3.7  4.2   Chloride 98 - 111 mmol/L 98  97  96   CO2 22 - 32 mmol/L '28  26  26   '$ Calcium 8.9 - 10.3 mg/dL 8.0  7.9  8.2     Assessment/Plan: Jean Mcclain is a 77 y.o. female with medical history significant of hypertension, hyperlipidemia, diabetes mellitus type II, ESRD on HD(M/W/F), chronic respiratory failure with hypoxia on 3 L of oxygen at night, Parkinson's disease, history of DVT not on anticoagulation, and wheelchair-bound due to left ankle fracture who presents for worsening shaking and jerking found to have seizures.  Principal Problem:   Symptomatic anemia Active Problems:   Diabetes mellitus due to underlying condition, controlled, with stage 2 chronic kidney disease, with long-term current use of insulin (HCC)   Kidney lesion   ESRD on hemodialysis (HCC)   Acute on chronic respiratory failure with hypoxia (HCC)   History of DVT (deep vein  thrombosis)   Acute diverticulitis   Nausea   Jerking   Thrombocytopenia (HCC)   Parkinson's disease   Abnormal TSH   Abnormal CT scan, gastrointestinal tract   Abnormal movements   Anemia  Symptomatic acute on chronic anemia.  Possible occult GI blood loss with recent history of diverticulitis and pending outpatient colonoscopy.   Hgb stable.Workup negative for DIC, LDH stable ruling out any intravascular hemolysis, she is s/p 1 unit of packed RBC transfusion on 10/24/2022 in the ER, she is on PPI, of note she is currently being worked for possible diverticular mass versus persistent diverticulitis and is due for colonoscopy.  Defer further management of GI issues to GI team.  Continue to monitor H&H.  Defer any antibiotic to the GI team as well.  GI deferring EGD and colonoscopy until seizures are under control.  If remains seizure-free please call GI back on 11/03/2022 Case discussed with Dr. Silverio Decamp.   New onset seizures 2 episodes in the hospital both witnessed.  Pt with initial improvement and  having multiple days without any seizure like activity but had 3 since yesterday. Neurology re-consulted. They initially started Vimpat along with Keppra but changed it to Depakote. EEG placed again. No seizure like activity noted overnight on EEG.   ESRD on HD.  Patient on MWF schedule. Nephrology on board and following. HD not performed yesterday.  Will reach out to nephrology to see when the next HD session is.  Incidental Neck Mass  Noted on MRI. CT soft tissue neck shows mass that is most likely benign and most consistent with dermal inclusion cyst.  -PCP to monitor outpatient  Acute on chronic hypoxic respiratory failure with underlying OSA.  Resolved. At home on 3 L and here on 2-3 L of oxygen.  Likely increased initially due to low hgb.    History of DVT in 2018.  Not on anticoagulation anymore, repeat lower extremity venous ultrasound ordered during admission remains negative.   History of Parkinson's disease.  Continue home regimen.   History of sick euthyroid syndrome.  Stable TSH now.   Mild persistent thrombocytopenia.  Up-trending. Will continue to monitor. Not on heparin. DIC panel negative and smear showed no schistocytes. On keppra which can cause thrombocytopenia. Will continue to monitor closely with daily labs.    Kidney lesion - Noted to be increased in size on CT scan from 07/2022 for which MRI of the abdomen recommended in 6 months at that time, PCP to schedule it for April 2024.   DM type II.  A1c is 6.9 in 07/2022 . Sliding scale discontinued given hypoglycemia yesterday. CBG within range.   Diet: Soft Diet IVF: None, VTE: SCDs Code: DNR PT/OT recs: pending Prior to Admission Living Arrangement: ALF Anticipated Discharge Location: TBD likely ALF Barriers to Discharge: Medical stability Dispo: Anticipated discharge in approximately 1-2 day(s).   Idamae Schuller, MD Tillie Rung. [facility] Internal Medicine Res[facility], PGY-2  Pager: [phone] After 5  pm and on weekends: Please call the on-call pager    Attestation -   I have directly reviewed the clinical findings, lab results and imaging studies. I have interviewed and examined the patient and agree with the documentation and management as recorded by the Dr Humphrey Rolls, patient admitted for new onset seizures along with subacute GI blood loss anemia, currently H&H stable after transfusion, GI following still having breakthrough seizures, discussed with stroke team Dr. Hortense Ramal in detail AEDs being adjusted, seizure-free for the last 24 hours, please call  GI for EGD and possible colonoscopy if she remains seizure-free on 11/03/2022.

## 2022-11-01 NOTE — Progress Notes (Signed)
Pt received in bed with EEG tech at bedside no c/os and no distress noted stable for HD via RUA AVF UFG 2L

## 2022-11-01 NOTE — Progress Notes (Signed)
Physical Therapy Treatment Patient Details Name: Jean Mcclain MRN: [id number] DOB: 1946/02/26 Today's Date: 11/01/2022   History of Present Illness 77 y/o F presenting to [facility] on 1/15 for reports of shaking. Pt is a resident at [facility]. Pt with a seizure event on 1/16, EEG findings of moderate diffuse encephalopathy. Another seizure event on 1/17, pending further work-up. MRI reveals no evidence of acute intracranial abnormality; seizure 1/22, then lost pulse, one round of CPR before ROSC;  PMHx: ESRD on HD, DM, hx of DVT, Parkinsons, chronic ankle and sacral pain from a fall 2 years ago.    PT Comments    Continuing work on functional mobility and activity tolerance;  Session focused on functional transfers; 2 person assist, Max assist to sit up to EOB, heavy mod assist to stand, and Max assist to support pt taking a few steps up towards [facility]; functional decline today compared to last PT session -- painful with movement, and anxious throughout session re: the possibility of having another seizure; Worth considering SNF for post-acute rehab to maximize independence and safety with mobility and ADLs prior to return to ALF   Recommendations for follow up therapy are one component of a multi-disciplinary discharge planning process, led by the attending physician.  Recommendations may be updated based on patient status, additional functional criteria and insurance authorization.  Follow Up Recommendations  Other (comment) (SNF, vs return to ALF if ALF is able to provide up to mod assist for transfers as well as increased supervision)     Assistance Recommended at Discharge Intermittent Supervision/Assistance  Patient can return home with the following Two people to help with walking and/or transfers;A lot of help with bathing/dressing/bathroom   Equipment Recommendations  Wheelchair (measurements PT);Wheelchair cushion (measurements PT);Hospital bed    Recommendations for Other Services        Precautions / Restrictions Precautions Precautions: Fall Precaution Comments: seizure, watch O2 Restrictions Weight Bearing Restrictions: No     Mobility  Bed Mobility Overal bed mobility: Needs Assistance Bed Mobility: Supine to Sit, Sit to Supine     Supine to sit: Max assist, +2 for physical assistance, +2 for safety/equipment, HOB elevated Sit to supine: Max assist, +2 for physical assistance, +2 for safety/equipment   General bed mobility comments: Max assist to get up to EOB; Needed assist and support at trunk to come to sit due to significant chest soreness Patient Response: Cooperative  Transfers Overall transfer level: Needs assistance Equipment used: 2 person hand held assist Transfers: Sit to/from Stand, Bed to chair/wheelchair/BSC Sit to Stand: Mod assist, +2 safety/equipment   Step pivot transfers: Max assist, +2 physical assistance       General transfer comment: Heavy mod assist to power up and steady; Max assist to take a few sidesteps to [facility]; difficulty weight shifting into single limb stance R and L to allow for foot advancement/stepping    Ambulation/Gait                   Stairs             Wheelchair Mobility    Modified Rankin (Stroke Patients Only)       Balance     Sitting balance-Leahy Scale: Poor       Standing balance-Leahy Scale: Zero                              Cognition Arousal/Alertness: Awake/alert Behavior During Therapy:  WFL for tasks assessed/performed Overall Cognitive Status: Within Functional Limits for tasks assessed (for simple mobility) Area of Impairment: Memory                               General Comments: Asked why her chest hurt multiple times; had to be reminded of chest compressions yesterday        Exercises      General Comments General comments (skin integrity, edema, etc.): Session conducted on 3 L supplemental O2; O2 sats ranged 89-95%; chest  painful with inspiration/cough; taught adn practiced pillow splinting      Pertinent Vitals/Pain Pain Assessment Pain Assessment: Faces Faces Pain Scale: Hurts even more Pain Location: back and buttocks; chest pain (compressions yesterday) with mobility Pain Descriptors / Indicators: Grimacing, Tender Pain Intervention(s): Monitored during session, Repositioned (into R sidelying)    Home Living                          Prior Function            PT Goals (current goals can now be found in the care plan section) Acute Rehab PT Goals Patient Stated Goal: Did not state, but indicated she wants therapy PT Goal Formulation: With patient Time For Goal Achievement: 11/16/2022 Potential to Achieve Goals: Fair (Will watch prgress, and consider updating goals) Progress towards PT goals: Not progressing toward goals - comment (Painful today, needing more assist)    Frequency    Min 3X/week      PT Plan Discharge plan needs to be updated (Worth giving SNF for rehab more consideration)    Co-evaluation              AM-PAC PT "6 Clicks" Mobility   Outcome Measure  Help needed turning from your back to your side while in a flat bed without using bedrails?: A Lot Help needed moving from lying on your back to sitting on the side of a flat bed without using bedrails?: Total Help needed moving to and from a bed to a chair (including a wheelchair)?: Total Help needed standing up from a chair using your arms (e.g., wheelchair or bedside chair)?: Total Help needed to walk in hospital room?: Total Help needed climbing 3-5 steps with a railing? : Total 6 Click Score: 7    End of Session Equipment Utilized During Treatment: Oxygen;Gait belt Activity Tolerance: Patient limited by fatigue;Patient limited by pain Patient left: with call bell/phone within reach;in bed;with bed alarm set Nurse Communication: Mobility status PT Visit Diagnosis: Other abnormalities of gait and  mobility (R26.89);Muscle weakness (generalized) (M62.81)     Time: 1120-1150 PT Time Calculation (min) (ACUTE ONLY): 30 min  Charges:  $Therapeutic Activity: 23-37 mins                     Roney Marion, PT  Acute Rehabilitation Services Office [phone]'   [place]ta [place] 11/01/2022, 1:29 PM

## 2022-11-01 NOTE — Progress Notes (Addendum)
Subjective: No acute events overnight.  Denies any new concerns.  ROS: negative except above Examination  Vital signs in last 24 hours: Temp:  [98 F (36.7 C)-98.2 F (36.8 C)] 98.2 F (36.8 C) (01/22 1600) Pulse Rate:  [65-66] 66 (01/23 0800) Resp:  [14-18] 14 (01/23 0800) BP: (111-129)/(51-56) 129/56 (01/23 0800) SpO2:  [98 %-100 %] 98 % (01/23 0800)  General: lying in bed, NAD Neuro: MS: Alert, oriented, follows commands CN: pupils equal and reactive,  EOMI, face symmetric, tongue midline, normal sensation over face, Motor: 4+/5 strength in all 4 extremities  Basic Metabolic Panel: Recent Labs  Lab 10/26/22 0834 10/27/22 0253 10/28/22 0507 10/29/22 0418 10/30/22 0233 10/31/22 0252 11/01/22 0705  NA 133* 135 134* 133* 132* 133* 134*  K 3.9 4.1 3.8 4.2 3.7 3.9 4.0  CL 99 100 97* 96* 97* 98 100  CO2 '26 27 28 26 26 28 24  '$ GLUCOSE 90 66* 66* 69* 105* 95 72  BUN 24* 24* '16 22 11 19 '$ 31*  CREATININE 3.49* 3.84* 2.94* 3.62* 2.17* 3.19* 4.11*  CALCIUM 8.0* 8.1* 7.8* 8.2* 7.9* 8.0* 8.0*  MG 1.6* 2.2 1.8 1.8  --   --   --   PHOS 3.2 3.9 2.7 3.4 2.5 3.1 3.6    CBC: Recent Labs  Lab 10/26/22 0834 10/27/22 0253 10/28/22 0507 10/29/22 0418 10/29/22 1029 10/30/22 0233 11/01/22 0705  WBC 4.4 3.9* 4.2 4.2 5.0 5.0 5.2  NEUTROABS 3.4 2.8 3.0 3.4  --  4.2  --   HGB 8.4* 8.2* 8.4* 8.1* 8.0* 8.7* 8.1*  HCT 27.0* 27.7* 26.7* 26.1* 26.2* 27.4* 26.5*  MCV 90.9 93.3 90.2 90.0 90.0 88.7 90.4  PLT 83* 80* 69* 68* 65* 72* 78*    Coagulation Studies: No results for input(s): "LABPROT", "INR" in the last 72 hours.  Imaging No new brain imaging overnight  ASSESSMENT AND PLAN: 77 year old wheelchair-bound female with history of hypertension, hyperlipidemia, diabetes, end-stage renal disease on hemodialysis parkinsonism, RLS and myoclonus who has been having multiple seizure-like episodes.   New onset focal epilepsy Chronic stroke, left parietal Acute encephalopathy, resolved -  seizure likely due to underlying stroke.  -Did not get Depakote last night for unclear reasons.   Recommendations - Continue Keppra '500mg'$  BID and '250mg'$  additional dose after HD  -Continue Depakote 500 mg twice daily -Continue LTM EEG, will likely continue for few more days as patient had breakthrough seizures over the weekend - PTN IV versed for seizure lasting over 5 minutes.  - Continue seizure precautions - Management of rest of comorbidities per primary team   I have spent a total of  26 minutes with the patient reviewing hospital notes,  test results, labs and examining the patient as well as establishing an assessment and plan.  > 50% of time was spent in direct patient care.    Zeb Comfort Epilepsy [facility] For questions after 5pm please refer to AMION to reach the Neurologist on call

## 2022-11-01 NOTE — Telephone Encounter (Signed)
Pt still admitted to hospital:  Contacted the Nursing staff at [facility] and questioned the frequency of the Zofran and Reglan Wells Guiles stated that pt has been getting the Reglan q 6 hrs scheduled and the Zofran Q 6 hrs PRN  Received secure chat from Flossie Dibble at [facility] stating that pt has a procedure with Dr. Fuller Plan on the 11/03/2022 at [facility] although pt is still admitted:   Scheduling notified and procedure canceled.

## 2022-11-02 DIAGNOSIS — Z992 Dependence on renal dialysis: Secondary | ICD-10-CM | POA: Diagnosis not present

## 2022-11-02 DIAGNOSIS — R569 Unspecified convulsions: Secondary | ICD-10-CM | POA: Diagnosis not present

## 2022-11-02 DIAGNOSIS — N186 End stage renal disease: Secondary | ICD-10-CM | POA: Diagnosis not present

## 2022-11-02 DIAGNOSIS — D649 Anemia, unspecified: Secondary | ICD-10-CM | POA: Diagnosis not present

## 2022-11-02 DIAGNOSIS — G40109 Localization-related (focal) (partial) symptomatic epilepsy and epileptic syndromes with simple partial seizures, not intractable, without status epilepticus: Secondary | ICD-10-CM | POA: Diagnosis not present

## 2022-11-02 LAB — RENAL FUNCTION PANEL
Albumin: 2.4 g/dL — ABNORMAL LOW (ref 3.5–5.0)
Anion gap: 10 (ref 5–15)
BUN: 21 mg/dL (ref 8–23)
CO2: 28 mmol/L (ref 22–32)
Calcium: 7.5 mg/dL — ABNORMAL LOW (ref 8.9–10.3)
Chloride: 98 mmol/L (ref 98–111)
Creatinine, Ser: 2.95 mg/dL — ABNORMAL HIGH (ref 0.44–1.00)
GFR, Estimated: 16 mL/min — ABNORMAL LOW (ref >60)
Glucose, Bld: 78 mg/dL (ref 70–99)
Phosphorus: 2.9 mg/dL (ref 2.5–4.6)
Potassium: 3.8 mmol/L (ref 3.5–5.1)
Sodium: 136 mmol/L (ref 135–145)

## 2022-11-02 LAB — GLUCOSE, CAPILLARY
Glucose-Capillary: 84 mg/dL (ref 70–99)
Glucose-Capillary: 80 mg/dL (ref 70–99)
Glucose-Capillary: 81 mg/dL (ref 70–99)

## 2022-11-02 LAB — AMMONIA: Ammonia: 27 umol/L (ref 9–35)

## 2022-11-02 LAB — PROLACTIN: Prolactin: 0.9 ng/mL — ABNORMAL LOW (ref 3.6–25.2)

## 2022-11-02 LAB — VALPROIC ACID LEVEL: Valproic Acid Lvl: 53 ug/mL (ref 50.0–100.0)

## 2022-11-02 MED ORDER — SODIUM CHLORIDE 0.9 % IV SOLN
250.0000 mg | Freq: Two times a day (BID) | INTRAVENOUS | Status: DC
  Administered 2022-11-02 – 2022-11-03 (×2): 250 mg via INTRAVENOUS
  Filled 2022-11-02 (×4): qty 2.5

## 2022-11-02 MED ORDER — NEPRO/CARBSTEADY PO LIQD
237.0000 mL | Freq: Three times a day (TID) | ORAL | Status: DC
  Administered 2022-11-02 (×2): 237 mL via ORAL

## 2022-11-02 NOTE — TOC Progression Note (Signed)
Transition of Care [facility]) - Progression Note    Patient Details  Name: Jean Mcclain MRN: [id number] Date of Birth: [birth year]  Transition [facility] Care [facility] CM/SW [place], LCSW Phone Number: 11/02/2022, 2:41 PM  Clinical Narrative:    CSW met with patient and her brother and sister in law at bedside to discuss overall discharge plan. They requested to re-visit the issue once patient is more medically stable. They reported hope that she can return to ALF because she has been to SNFs before and they were awful.   CSW spoke with Wells Guiles at [facility] [place] [facility] [place] faxed her therapy notes as requested for review. She stated that patient has oxygen prn with a concentrator but does not have a portable tank like she would need for continuous O2 in order to go to dialysis. They use [facility] if needed. They use [facility] for in house therapies.     Expected Discharge Plan: Assisted Living Barriers to Discharge: Continued Medical Work up  Expected Discharge Plan and Services In-house Referral: Clinical Social Work   Post Acute Care Choice: [place] arrangements for the past 2 months: [place]                                       Social Determinants of Health (SDOH) Interventions SDOH Screenings   Food Insecurity: No Food Insecurity (10/25/2022)  Housing: Low Risk  (10/25/2022)  Transportation Needs: No Transportation Needs (10/25/2022)  Utilities: Not At Risk (10/25/2022)  Depression (PHQ2-9): Low Risk  (12/06/2019)  Tobacco Use: Medium Risk (10/25/2022)    Readmission Risk Interventions    10/31/2022    9:27 AM  Readmission Risk Prevention Plan  Transportation Screening Complete  Medication Review ([place]) Complete  PCP or Specialist appointment within 3-5 days of discharge Complete  HRI or [place] Complete  SW Recovery Care/Counseling Consult Complete  [place] Not Applicable

## 2022-11-02 NOTE — Procedures (Signed)
Patient Name: Jean Mcclain  MRN: [id number]  Epilepsy Attending: Lora Havens  Referring Physician/Provider: Lora Havens, MD  Duration: 11/01/2022 0915 to 11/02/2022 0915   Patient history: 77 year old female with seizure-like episodes.  EEG to evaluate for seizure.   Level of alertness: Awake, asleep   AEDs during EEG study: LEV, VPA   Technical aspects: This EEG study was done with scalp electrodes positioned according to the 10-20 International system of electrode placement. Electrical activity was reviewed with band pass filter of 1-'70Hz'$ , sensitivity of 7 uV/mm, display speed of 82m/sec with a '60Hz'$  notched filter applied as appropriate. EEG data were recorded continuously and digitally stored.  Video monitoring was available and reviewed as appropriate.   Description: The posterior dominant rhythm consists of '8Hz'$  activity of moderate voltage (25-35 uV) seen predominantly in posterior head regions, symmetric and reactive to eye opening and eye closing. Sleep was characterized by vertex waves, sleep spindles (12 to 14 Hz), maximal frontocentral region. EEG showed continuous generalized predominantly 5 to 7 Hz theta slowing admixed with 2 to 3 Hz generalized delta slowing, at times with triphasic morphology. Hyperventilation and photic stimulation were not performed.      ABNORMALITY -Continuous slow, generalized   IMPRESSION: This study is suggestive of moderate diffuse encephalopathy, nonspecific etiology.  No seizures or epileptiform discharges were seen throughout the recording.   Hridaan Bouse OZola Button

## 2022-11-02 NOTE — Progress Notes (Signed)
Occupational Therapy Treatment Patient Details Name: Jean Mcclain MRN: [id number] DOB: March 31, 1946 Today's Date: 11/02/2022   History of present illness 77 y/o F presenting to [facility] on 1/15 for reports of shaking. Pt is a resident at [facility]. Pt with a seizure event on 1/16, EEG findings of moderate diffuse encephalopathy. Another seizure event on 1/17, pending further work-up. MRI reveals no evidence of acute intracranial abnormality; seizure 1/22, then lost pulse, one round of CPR before ROSC;  PMHx: ESRD on HD, DM, hx of DVT, Parkinsons, chronic ankle and sacral pain from a fall 2 years ago.   OT comments  Patient received in supine and reluctant to sit on EOB but agreeable to address gown change and washing back. Patient continues to require max assist for bed mobility and limited participation with self care tasks. Patient with complaints of pain at buttocks and declining standing from EOB. Patient positioned in bed at end of session on left side to relieve pressure on bottom. Acute OT to continue to follow to increase functional mobility and self care.    Recommendations for follow up therapy are one component of a multi-disciplinary discharge planning process, led by the attending physician.  Recommendations may be updated based on patient status, additional functional criteria and insurance authorization.    Follow Up Recommendations  Skilled nursing-short term rehab (<3 hours/day)     Assistance Recommended at Discharge Frequent or constant Supervision/Assistance  Patient can return home with the following  A lot of help with walking and/or transfers;A lot of help with bathing/dressing/bathroom   Equipment Recommendations  None recommended by OT    Recommendations for Other Services      Precautions / Restrictions Precautions Precautions: Fall Precaution Comments: seizure, watch O2 Restrictions Weight Bearing Restrictions: No       Mobility Bed Mobility Overal bed  mobility: Needs Assistance Bed Mobility: Supine to Sit, Sit to Supine     Supine to sit: Max assist, +2 for physical assistance, +2 for safety/equipment, HOB elevated Sit to supine: Max assist, +2 for physical assistance, +2 for safety/equipment   General bed mobility comments: patient required assistance with BLE and trunk to get to EOB and to return to supine.  Assistance with positioning in bed    Transfers Overall transfer level: Needs assistance                 General transfer comment: Patient declined standing     Balance Overall balance assessment: Needs assistance Sitting-balance support: Feet supported, Bilateral upper extremity supported Sitting balance-Leahy Scale: Poor Sitting balance - Comments: required mod assist for sitting balance once on EOB but progressed to min assist Postural control: Left lateral lean                                 ADL either performed or assessed with clinical judgement   ADL Overall ADL's : Needs assistance/impaired     Grooming: Wash/dry hands;Wash/dry face;Minimal assistance;Sitting Grooming Details (indicate cue type and reason): performed seated on EOB Upper Body Bathing: Moderate assistance;Sitting Upper Body Bathing Details (indicate cue type and reason): assistance for back     Upper Body Dressing : Moderate assistance;Bed level Upper Body Dressing Details (indicate cue type and reason): to change gown                   General ADL Comments: assist as needed at baseline    Extremity/Trunk  Assessment              Vision       Perception     Praxis      Cognition Arousal/Alertness: Awake/alert Behavior During Therapy: WFL for tasks assessed/performed Overall Cognitive Status: Within Functional Limits for tasks assessed                                 General Comments: focused on pain at buttocks        Exercises      Shoulder Instructions       General  Comments VSS on 3 liters of O2    Pertinent Vitals/ Pain       Pain Assessment Pain Assessment: Faces Faces Pain Scale: Hurts even more Pain Location: back and buttocks Pain Descriptors / Indicators: Grimacing, Sore, Tender Pain Intervention(s): Limited activity within patient's tolerance, Monitored during session, Repositioned, Other (comment) (postioned on left side at end of session)  Home Living                                          Prior Functioning/Environment              Frequency  Min 2X/week        Progress Toward Goals  OT Goals(current goals can now be found in the care plan section)  Progress towards OT goals: Progressing toward goals  Acute Rehab OT Goals Patient Stated Goal: get better OT Goal Formulation: With patient Time For Goal Achievement: 12/07/2022 Potential to Achieve Goals: Good ADL Goals Pt Will Perform Grooming: with set-up;sitting Pt Will Transfer to Toilet: with min assist;stand pivot transfer;bedside commode  Plan Discharge plan remains appropriate    Co-evaluation    PT/OT/SLP Co-Evaluation/Treatment: Yes Reason for Co-Treatment: For patient/therapist safety   OT goals addressed during session: ADL's and self-care      AM-PAC OT "6 Clicks" Daily Activity     Outcome Measure   Help from another person eating meals?: A Little Help from another person taking care of personal grooming?: A Little Help from another person toileting, which includes using toliet, bedpan, or urinal?: A Lot Help from another person bathing (including washing, rinsing, drying)?: A Lot Help from another person to put on and taking off regular upper body clothing?: A Lot Help from another person to put on and taking off regular lower body clothing?: A Lot 6 Click Score: 14    End of Session Equipment Utilized During Treatment: Oxygen  OT Visit Diagnosis: Unsteadiness on feet (R26.81)   Activity Tolerance Patient tolerated  treatment well;Patient limited by pain   Patient Left in bed;with call bell/phone within reach   Nurse Communication Mobility status        Time: 0981-1914 OT Time Calculation (min): 30 min  Charges: OT General Charges $OT Visit: 1 Visit OT Treatments $Self Care/Home Management : 8-22 mins  Lodema Hong, [place]  Office [place] 11/02/2022, 1:08 PM

## 2022-11-02 NOTE — Progress Notes (Signed)
[facility] Progress Note   Subjective:  Seen in room. Had dialysis yesterday. Thinks it went ok, but nausea/vomiting this am. She's very uncomfortable- tailbone hurts. Unclear if having EGD today.   Objective Vitals:   11/02/22 0751 11/02/22 0900 11/02/22 1000 11/02/22 1035  BP: (!) 121/56     Pulse: (!) 59 (!) 55 62 (!) 55  Resp: 14 14 (!) 21 14  Temp: 98.2 F (36.8 C)     TempSrc: Oral     SpO2: 95% 96% 97% 97%  Weight:      Height:       Physical Exam General: chronically ill appearing female in NAD Heart: RRR, no mrg Lungs: CTAB anterolaterally, nml WOB on 2L O2 via [place] Abdomen: soft, NTND Extremities:trace LE edema  Dialysis Access: RU AVF +b/t   Filed Weights   10/27/22 1710 10/29/22 1300 11/02/22 0500  Weight: 67.2 kg 62.7 kg 63.5 kg    Intake/Output Summary (Last 24 hours) at 11/02/2022 1118 Last data filed at 11/01/2022 2102 Gross per 24 hour  Intake 3 ml  Output --  Net 3 ml     Additional Objective Labs: Basic Metabolic Panel: Recent Labs  Lab 10/31/22 0252 11/01/22 0705 11/02/22 0757  NA 133* 134* 136  K 3.9 4.0 3.8  CL 98 100 98  CO2 '28 24 28  '$ GLUCOSE 95 72 78  BUN 19 31* 21  CREATININE 3.19* 4.11* 2.95*  CALCIUM 8.0* 8.0* 7.5*  PHOS 3.1 3.6 2.9    Liver Function Tests: Recent Labs  Lab 10/31/22 0252 11/01/22 0705 11/02/22 0757  ALBUMIN 2.4* 2.5* 2.4*    CBC: Recent Labs  Lab 10/28/22 0507 10/29/22 0418 10/29/22 1029 10/30/22 0233 11/01/22 0705  WBC 4.2 4.2 5.0 5.0 5.2  NEUTROABS 3.0 3.4  --  4.2  --   HGB 8.4* 8.1* 8.0* 8.7* 8.1*  HCT 26.7* 26.1* 26.2* 27.4* 26.5*  MCV 90.2 90.0 90.0 88.7 90.4  PLT 69* 68* 65* 72* 78*    CBG: Recent Labs  Lab 10/31/22 1621 11/01/22 0908 11/01/22 1156 11/01/22 2110 11/02/22 0753  GLUCAP 99 74 95 88 84     Studies/Results: No results found.  Medications:  anticoagulant sodium citrate     levETIRAcetam 250 mg (10/31/22 2254)   levETIRAcetam     valproate  sodium      aspirin  81 mg Oral Daily   atorvastatin  40 mg Oral Daily   buprenorphine  1 patch Transdermal Weekly   calcitRIOL  1.25 mcg Oral Q M,W,F-HD   camphor-menthol  1 Application Topical Daily   carbidopa-levodopa  2 tablet Oral q AM   And   carbidopa-levodopa  1 tablet Oral BID   Chlorhexidine Gluconate Cloth  6 each Topical Q0600   Chlorhexidine Gluconate Cloth  6 each Topical Q0600   Chlorhexidine Gluconate Cloth  6 each Topical Q0600   darbepoetin (ARANESP) injection - DIALYSIS  200 mcg Subcutaneous Q Fri-1800   divalproex  500 mg Oral Q12H   melatonin  5 mg Oral QHS   midodrine  20 mg Oral Q M,W,F-HD   mirtazapine  15 mg Oral QHS   Muscle Rub   Topical TID   nystatin  1 Application Topical Daily   pantoprazole (PROTONIX) IV  40 mg Intravenous Q12H   rOPINIRole  12 mg Oral Daily   sodium chloride flush  3 mL Intravenous Q12H   tamsulosin  0.4 mg Oral QHS    Dialysis Orders: [facility] MWF 3:15h  67.9 kg 3K/2Ca  AVF Hep 3000 Mircera 258mg - last given 10/09/22 Venofer 50 qwk   Assessment/Plan: 1. Myoclonus/ jerking: initially thought possibly gabapentin toxicity- CT head negative.  Electrolytes OK.  Stopped gabapentin. Now I'm wondering if all related to #2 MRI  - no acute intracranial findings, incomplete image mass/lesion in left neck. EEG w/mild-mod encephalopathy, non specific etiology. No seizure activity noted.  2 Seizure:  First seizure 1/16, endo cancelled, loaded with Keppra, neuro following.  Another seizure at last dialysis. Stopped with 2 mg IV ativan.  EEG negative. Per neuro seizure likely d/t underlying stroke. Had breakthrough seizures. Now getting Vimpat load.  3. Diverticulitis of L colon - seen on CT.  Schedule for EGD and colonoscopy once stable.  Per GI 3 ESRD: MWF [facility]. Off schedule. Had HD 1/23. No urgent indication today, will keep off schedule this week. Next HD 1/25.  4 Hypertension: BP/volume ok.  Continue home meds. Close to dry, UF as tolerated.  5.  Anemia of ESRD: Hgb 8.1, s/p 1 u pRBCs 1/15.  Aranesp 2011m qFri 6. Metabolic Bone Disease - CCa, phos, Mg in goal.  Continue calcitriol. 7. Nutrition - Renal diet w/fluid restrictions. 8. Nausea - on antiemetics.   OgLynnda ChildA-C [facility] 11/02/2022,11:18 AM

## 2022-11-02 NOTE — Progress Notes (Signed)
Physical Therapy Treatment Patient Details Name: Jean Mcclain MRN: [id number] DOB: 12-02-1945 Today's Date: 11/02/2022   History of Present Illness 77 y/o F presenting to [facility] on 1/15 for reports of shaking. Pt is a resident at [facility]. Pt with a seizure event on 1/16, EEG findings of moderate diffuse encephalopathy. Another seizure event on 1/17, pending further work-up. MRI reveals no evidence of acute intracranial abnormality; seizure 1/22, then lost pulse, one round of CPR before ROSC;  PMHx: ESRD on HD, DM, hx of DVT, Parkinsons, chronic ankle and sacral pain from a fall 2 years ago.    PT Comments    Pt limited during today's session by pain, requiring max encouragement to sit EOB today. Pt able to progress to minA for sitting balance on EOB, performing seated TherEx but noted limited ROM due to strength deficits and fatigue. Educated pt on importance of therapy and progressing OOB mobility, however pt focused on pain in her bottom, declining any OOB attempts. Acute PT will continue to follow pt to progress mobility as tolerated, will continue to assess discharge recommendations as pt is tolerating mobility.     Recommendations for follow up therapy are one component of a multi-disciplinary discharge planning process, led by the attending physician.  Recommendations may be updated based on patient status, additional functional criteria and insurance authorization.  Follow Up Recommendations  Other (comment) (SNF, vs return to ALF if ALF is able to provide up to mod assist for transfers as well as increased supervision)     Assistance Recommended at Discharge Intermittent Supervision/Assistance  Patient can return home with the following Two people to help with walking and/or transfers;A lot of help with bathing/dressing/bathroom   Equipment Recommendations  Wheelchair (measurements PT);Wheelchair cushion (measurements PT);Hospital bed    Recommendations for Other Services        Precautions / Restrictions Precautions Precautions: Fall Precaution Comments: seizure, watch O2 Restrictions Weight Bearing Restrictions: No     Mobility  Bed Mobility Overal bed mobility: Needs Assistance Bed Mobility: Supine to Sit, Sit to Supine     Supine to sit: Max assist, +2 for physical assistance, +2 for safety/equipment, HOB elevated Sit to supine: Max assist, +2 for physical assistance, +2 for safety/equipment   General bed mobility comments: patient required assistance with BLE and trunk to get to EOB and to return to supine.  Assistance with positioning in bed    Transfers                   General transfer comment: Patient declined standing even with max encouragement    Ambulation/Gait                   Stairs             Wheelchair Mobility    Modified Rankin (Stroke Patients Only)       Balance Overall balance assessment: Needs assistance Sitting-balance support: Feet supported, Bilateral upper extremity supported Sitting balance-Leahy Scale: Poor Sitting balance - Comments: required mod assist for sitting balance once on EOB but progressed to min assist Postural control: Left lateral lean                                  Cognition Arousal/Alertness: Awake/alert Behavior During Therapy: WFL for tasks assessed/performed Overall Cognitive Status: Within Functional Limits for tasks assessed  General Comments: focused on pain at buttocks        Exercises General Exercises - Lower Extremity Ankle Circles/Pumps: AROM, Both, 5 reps, Seated Long Arc Quad: AROM, Both, 5 reps, Seated Hip Flexion/Marching: AROM, Both, 5 reps, Seated    General Comments General comments (skin integrity, edema, etc.): VSS on 3L O2       Pertinent Vitals/Pain Pain Assessment Pain Assessment: Faces Faces Pain Scale: Hurts even more Pain Location: back and buttocks Pain  Descriptors / Indicators: Grimacing, Sore, Tender Pain Intervention(s): Monitored during session, Limited activity within patient's tolerance, Repositioned    Home Living                          Prior Function            PT Goals (current goals can now be found in the care plan section) Acute Rehab PT Goals Patient Stated Goal: Did not state, but indicated she wants therapy PT Goal Formulation: With patient Time For Goal Achievement: 12/03/2022 Potential to Achieve Goals: Fair (Will watch prgress, and consider updating goals) Progress towards PT goals: Not progressing toward goals - comment (limited session due to pain today)    Frequency    Min 3X/week      PT Plan Discharge plan needs to be updated (Pt remains limited by pain, may be more appropriate for SNF at discharge, will continue to assess as pt is able to tolerate more)    Co-evaluation PT/OT/SLP Co-Evaluation/Treatment: Yes Reason for Co-Treatment: For patient/therapist safety PT goals addressed during session: Mobility/safety with mobility;Balance;Strengthening/ROM OT goals addressed during session: ADL's and self-care      AM-PAC PT "6 Clicks" Mobility   Outcome Measure  Help needed turning from your back to your side while in a flat bed without using bedrails?: A Lot Help needed moving from lying on your back to sitting on the side of a flat bed without using bedrails?: Total Help needed moving to and from a bed to a chair (including a wheelchair)?: Total Help needed standing up from a chair using your arms (e.g., wheelchair or bedside chair)?: Total Help needed to walk in hospital room?: Total Help needed climbing 3-5 steps with a railing? : Total 6 Click Score: 7    End of Session Equipment Utilized During Treatment: Oxygen Activity Tolerance: Patient limited by fatigue;Patient limited by pain Patient left: with call bell/phone within reach;in bed;with bed alarm set Nurse Communication:  Mobility status PT Visit Diagnosis: Other abnormalities of gait and mobility (R26.89);Muscle weakness (generalized) (M62.81)     Time: 5462-7035 PT Time Calculation (min) (ACUTE ONLY): 29 min  Charges:  $Therapeutic Activity: 8-22 mins                     Charlynne Cousins, PT DPT Acute Rehabilitation Services Office [phone]    Jean Mcclain 11/02/2022, 1:34 PM

## 2022-11-02 NOTE — Progress Notes (Signed)
PROGRESS NOTE    Jean Mcclain  GMW:[id number] DOB: 06/02/1946 DOA: 10/24/2022 PCP: Pcp, No   Chief Complaint  Patient presents with   Shaking    Brief Narrative:   Overnight: NAEON, Pt with multiple seizures yesterday and 1 episode where pt thought to have lost pulse, appears to be 2/2 to vasovagal 2/2 seizure like activity.    Patient states her chest was hurting the same.  Her nausea is better.  No other acute concerns endorsed.   Assessment & Plan:   Principal Problem:   Symptomatic anemia Active Problems:   Jerking   Acute diverticulitis   Acute on chronic respiratory failure with hypoxia (HCC)   Nausea   ESRD on hemodialysis (HCC)   Diabetes mellitus due to underlying condition, controlled, with stage 2 chronic kidney disease, with long-term current use of insulin (HCC)   Thrombocytopenia (HCC)   History of DVT (deep vein thrombosis)   Parkinson's disease   Abnormal TSH   Kidney lesion   Abnormal CT scan, gastrointestinal tract   Abnormal movements   Anemia      Symptomatic acute on chronic anemia.  Possible occult GI blood loss with recent history of diverticulitis and pending outpatient colonoscopy.   -This post manage PRBC transfusion 1/15 -GI is following, plan for endoscopy once medically stable and now has no further seizures. -She remained seizure-free until 11/03/2022, will notify GI . -Tomorrow Continue to monitor CBC -Aranesp and IV iron per renal  New onset seizures 2 episodes in the hospital both witnessed.  -Management per neurology -Continue with Keppra -Continue with Depakote -EEG showing mild generalized slowing at times triphasic waves, Depakote level and ammonia is pending -Cannot tolerate Vimpat due to bradycardia -Likely will need to stay few more days on LTM EEG given breakthrough seizures over the weekend  ESRD on HD.  Patient on MWF schedule. Nephrology on board and following. HD not performed yesterday.  Will reach out to  nephrology to see when the next HD session is.   Incidental Neck Mass  Noted on MRI. CT soft tissue neck shows mass that is most likely benign and most consistent with dermal inclusion cyst.  -PCP to monitor outpatient   Acute on chronic hypoxic respiratory failure with underlying OSA.  Resolved. At home on 3 L and here on 2-3 L of oxygen.  Likely increased initially due to low hgb.    History of DVT in 2018.  Not on anticoagulation anymore, repeat lower extremity venous ultrasound ordered during admission remains negative.   History of Parkinson's disease.  Continue home regimen.   History of sick euthyroid syndrome.  Stable TSH now.   Mild persistent thrombocytopenia.  Up-trending. Will continue to monitor. Not on heparin. DIC panel negative and smear showed no schistocytes. On keppra which can cause thrombocytopenia. Will continue to monitor closely with daily labs.    Kidney lesion - Noted to be increased in size on CT scan from 07/2022 for which MRI of the abdomen recommended in 6 months at that time, PCP to schedule it for April 2024.   DM type II.  A1c is 6.9 in 07/2022 . Sliding scale discontinued given hypoglycemia yesterday. CBG within range.    DVT prophylaxis:SCD Code Status: DNR Family Communication: None at bedside Disposition:   Status is: Inpatient    Consultants:  GI Renal Neurology   Subjective:  No seizures overnight, she reports her tailbone hurts.  Objective: Vitals:   11/02/22 1000 11/02/22 1035 11/02/22 1159 11/02/22 1246  BP:   133/60   Pulse: 62 (!) 55 64 (!) 53  Resp: (!) '21 14 20 16  '$ Temp:   98.2 F (36.8 C)   TempSrc:   Oral   SpO2: 97% 97% 93% 99%  Weight:      Height:        Intake/Output Summary (Last 24 hours) at 11/02/2022 1342 Last data filed at 11/01/2022 2102 Gross per 24 hour  Intake 3 ml  Output --  Net 3 ml   Filed Weights   10/27/22 1710 10/29/22 1300 11/02/22 0500  Weight: 67.2 kg 62.7 kg 63.5 kg     Examination:  Awake Alert, Oriented X 3, extremely frail, deconditioned, currently ill-appearing Symmetrical Chest wall movement, diminished air entry at the bases RRR,No Gallops,Rubs or new Murmurs, No Parasternal Heave +ve B.Sounds, Abd Soft, No tenderness, No rebound - guarding or rigidity. No Cyanosis, Clubbing, trace edema    Data Reviewed: I have personally reviewed following labs and imaging studies  CBC: Recent Labs  Lab 10/27/22 0253 10/28/22 0507 10/29/22 0418 10/29/22 1029 10/30/22 0233 11/01/22 0705  WBC 3.9* 4.2 4.2 5.0 5.0 5.2  NEUTROABS 2.8 3.0 3.4  --  4.2  --   HGB 8.2* 8.4* 8.1* 8.0* 8.7* 8.1*  HCT 27.7* 26.7* 26.1* 26.2* 27.4* 26.5*  MCV 93.3 90.2 90.0 90.0 88.7 90.4  PLT 80* 69* 68* 65* 72* 78*    Basic Metabolic Panel: Recent Labs  Lab 10/27/22 0253 10/28/22 0507 10/29/22 0418 10/30/22 0233 10/31/22 0252 11/01/22 0705 11/02/22 0757  NA 135 134* 133* 132* 133* 134* 136  K 4.1 3.8 4.2 3.7 3.9 4.0 3.8  CL 100 97* 96* 97* 98 100 98  CO2 '27 28 26 26 28 24 28  '$ GLUCOSE 66* 66* 69* 105* 95 72 78  BUN 24* '16 22 11 19 '$ 31* 21  CREATININE 3.84* 2.94* 3.62* 2.17* 3.19* 4.11* 2.95*  CALCIUM 8.1* 7.8* 8.2* 7.9* 8.0* 8.0* 7.5*  MG 2.2 1.8 1.8  --   --   --   --   PHOS 3.9 2.7 3.4 2.5 3.1 3.6 2.9    GFR: Estimated Creatinine Clearance: 13.5 mL/min (A) (by C-G formula based on SCr of 2.95 mg/dL (H)).  Liver Function Tests: Recent Labs  Lab 10/29/22 0418 10/30/22 0233 10/31/22 0252 11/01/22 0705 11/02/22 0757  ALBUMIN 2.5* 2.5* 2.4* 2.5* 2.4*    CBG: Recent Labs  Lab 11/01/22 0908 11/01/22 1156 11/01/22 2110 11/02/22 0753 11/02/22 1204  GLUCAP 74 95 88 84 80     No results found for this or any previous visit (from the past 240 hour(s)).       Radiology Studies: No results found.      Scheduled Meds:  aspirin  81 mg Oral Daily   atorvastatin  40 mg Oral Daily   buprenorphine  1 patch Transdermal Weekly   calcitRIOL   1.25 mcg Oral Q M,W,F-HD   camphor-menthol  1 Application Topical Daily   carbidopa-levodopa  2 tablet Oral q AM   And   carbidopa-levodopa  1 tablet Oral BID   Chlorhexidine Gluconate Cloth  6 each Topical Q0600   darbepoetin (ARANESP) injection - DIALYSIS  200 mcg Subcutaneous Q Fri-1800   divalproex  500 mg Oral Q12H   melatonin  5 mg Oral QHS   midodrine  20 mg Oral Q M,W,F-HD   mirtazapine  15 mg Oral QHS   Muscle Rub   Topical TID   nystatin  1 Application Topical  Daily   pantoprazole (PROTONIX) IV  40 mg Intravenous Q12H   rOPINIRole  12 mg Oral Daily   sodium chloride flush  3 mL Intravenous Q12H   tamsulosin  0.4 mg Oral QHS   Continuous Infusions:  anticoagulant sodium citrate     levETIRAcetam 250 mg (10/31/22 2254)   levETIRAcetam     valproate sodium       LOS: 8 days        Phillips Climes, MD [facility]   To contact the attending provider between 7A-7P or the covering provider during after hours 7P-7A, please log into the web site [email or web address] and access using universal [place] password for that web site. If you do not have the password, please call the hospital operator.  11/02/2022, 1:42 PM

## 2022-11-02 NOTE — Progress Notes (Signed)
Subjective: No further seizures overnight.  ROS: negative except above  Examination  Vital signs in last 24 hours: Temp:  [97.6 F (36.4 C)-98.9 F (37.2 C)] 98.2 F (36.8 C) (01/24 0751) Pulse Rate:  [55-69] 59 (01/24 0751) Resp:  [12-26] 14 (01/24 0751) BP: (92-134)/(41-56) 121/56 (01/24 0751) SpO2:  [91 %-100 %] 95 % (01/24 0751) Weight:  [63.5 kg] 63.5 kg (01/24 0500)  General: lying in bed, NAD Neuro: MS: Alert, oriented, follows commands CN: pupils equal and reactive,  EOMI, face symmetric, tongue midline, normal sensation over face, Motor: 4+/5 strength in all 4 extremities  Basic Metabolic Panel: Recent Labs  Lab 10/27/22 0253 10/28/22 0507 10/29/22 0418 10/30/22 0233 10/31/22 0252 11/01/22 0705  NA 135 134* 133* 132* 133* 134*  K 4.1 3.8 4.2 3.7 3.9 4.0  CL 100 97* 96* 97* 98 100  CO2 '27 28 26 26 28 24  '$ GLUCOSE 66* 66* 69* 105* 95 72  BUN 24* '16 22 11 19 '$ 31*  CREATININE 3.84* 2.94* 3.62* 2.17* 3.19* 4.11*  CALCIUM 8.1* 7.8* 8.2* 7.9* 8.0* 8.0*  MG 2.2 1.8 1.8  --   --   --   PHOS 3.9 2.7 3.4 2.5 3.1 3.6    CBC: Recent Labs  Lab 10/27/22 0253 10/28/22 0507 10/29/22 0418 10/29/22 1029 10/30/22 0233 11/01/22 0705  WBC 3.9* 4.2 4.2 5.0 5.0 5.2  NEUTROABS 2.8 3.0 3.4  --  4.2  --   HGB 8.2* 8.4* 8.1* 8.0* 8.7* 8.1*  HCT 27.7* 26.7* 26.1* 26.2* 27.4* 26.5*  MCV 93.3 90.2 90.0 90.0 88.7 90.4  PLT 80* 69* 68* 65* 72* 78*    Coagulation Studies: No results for input(s): "LABPROT", "INR" in the last 72 hours.  Imaging No new brain imaging overnight   ASSESSMENT AND PLAN: 77 year old wheelchair-bound female with history of hypertension, hyperlipidemia, diabetes, end-stage renal disease on hemodialysis parkinsonism, RLS and myoclonus who has been having multiple seizure-like episodes.   New onset focal epilepsy Chronic stroke, left parietal Acute encephalopathy, resolved Thrombocytopenia - seizure likely due to underlying stroke.  -Could be  secondary to [place] as patient came in with normal platelets and platelet count dropped after having Keppra. Depakote was added but has been stable.  Recommendations -Reduce Keppra to '250mg'$  BID and '250mg'$  additional dose after HD.  If platelets do not improve, may need to completely stop eventually -Continue Depakote 500 mg twice daily.  EEG showing mild generalized slowing at times triphasic waves.  Therefore, will check Depakote level and ammonia -Did not tolerate Vimpat due to bradycardia with heart rate in the 40s and PR interval 181m on 10/31/2022 -Continue LTM EEG, will likely continue for few more days as patient had breakthrough seizures over the weekend - PTN IV versed for seizure lasting over 5 minutes.  - Continue seizure precautions - Management of rest of comorbidities per primary team   I have spent a total of  36 minutes with the patient reviewing hospital notes,  test results, labs and examining the patient as well as establishing an assessment and plan.  > 50% of time was spent in direct patient care.   PZeb ComfortEpilepsy [facility] For questions after 5pm please refer to AMION to reach the Neurologist on call

## 2022-11-03 ENCOUNTER — Ambulatory Visit (HOSPITAL_COMMUNITY): Admission: RE | Admit: 2022-11-03 | Payer: PPO | Source: Home / Self Care | Admitting: Gastroenterology

## 2022-11-03 ENCOUNTER — Encounter (HOSPITAL_COMMUNITY): Payer: Self-pay | Admitting: Anesthesiology

## 2022-11-03 ENCOUNTER — Encounter (HOSPITAL_COMMUNITY): Admission: RE | Payer: Self-pay | Source: Home / Self Care

## 2022-11-03 DIAGNOSIS — Z992 Dependence on renal dialysis: Secondary | ICD-10-CM | POA: Diagnosis not present

## 2022-11-03 DIAGNOSIS — D649 Anemia, unspecified: Secondary | ICD-10-CM | POA: Diagnosis not present

## 2022-11-03 DIAGNOSIS — G40909 Epilepsy, unspecified, not intractable, without status epilepticus: Secondary | ICD-10-CM

## 2022-11-03 DIAGNOSIS — R112 Nausea with vomiting, unspecified: Secondary | ICD-10-CM | POA: Diagnosis not present

## 2022-11-03 DIAGNOSIS — R569 Unspecified convulsions: Secondary | ICD-10-CM | POA: Diagnosis not present

## 2022-11-03 DIAGNOSIS — N186 End stage renal disease: Secondary | ICD-10-CM | POA: Diagnosis not present

## 2022-11-03 LAB — GLUCOSE, CAPILLARY
Glucose-Capillary: 67 mg/dL — ABNORMAL LOW (ref 70–99)
Glucose-Capillary: 71 mg/dL (ref 70–99)
Glucose-Capillary: 81 mg/dL (ref 70–99)
Glucose-Capillary: 77 mg/dL (ref 70–99)
Glucose-Capillary: 65 mg/dL — ABNORMAL LOW (ref 70–99)
Glucose-Capillary: 69 mg/dL — ABNORMAL LOW (ref 70–99)
Glucose-Capillary: 105 mg/dL — ABNORMAL HIGH (ref 70–99)
Glucose-Capillary: 115 mg/dL — ABNORMAL HIGH (ref 70–99)

## 2022-11-03 LAB — CBC
HCT: 25.2 % — ABNORMAL LOW (ref 36.0–46.0)
Hemoglobin: 7.7 g/dL — ABNORMAL LOW (ref 12.0–15.0)
MCH: 27.1 pg (ref 26.0–34.0)
MCHC: 30.6 g/dL (ref 30.0–36.0)
MCV: 88.7 fL (ref 80.0–100.0)
Platelets: 71 10*3/uL — ABNORMAL LOW (ref 150–400)
RBC: 2.84 MIL/uL — ABNORMAL LOW (ref 3.87–5.11)
RDW: 18.1 % — ABNORMAL HIGH (ref 11.5–15.5)
WBC: 6.1 10*3/uL (ref 4.0–10.5)
nRBC: 0 % (ref 0.0–0.2)

## 2022-11-03 LAB — RENAL FUNCTION PANEL
Albumin: 2.4 g/dL — ABNORMAL LOW (ref 3.5–5.0)
Anion gap: 13 (ref 5–15)
BUN: 32 mg/dL — ABNORMAL HIGH (ref 8–23)
CO2: 25 mmol/L (ref 22–32)
Calcium: 7.5 mg/dL — ABNORMAL LOW (ref 8.9–10.3)
Chloride: 96 mmol/L — ABNORMAL LOW (ref 98–111)
Creatinine, Ser: 3.62 mg/dL — ABNORMAL HIGH (ref 0.44–1.00)
GFR, Estimated: 12 mL/min — ABNORMAL LOW (ref >60)
Glucose, Bld: 80 mg/dL (ref 70–99)
Phosphorus: 3.6 mg/dL (ref 2.5–4.6)
Potassium: 4.1 mmol/L (ref 3.5–5.1)
Sodium: 134 mmol/L — ABNORMAL LOW (ref 135–145)

## 2022-11-03 LAB — PREPARE RBC (CROSSMATCH)

## 2022-11-03 SURGERY — COLONOSCOPY WITH PROPOFOL
Anesthesia: Monitor Anesthesia Care

## 2022-11-03 MED ORDER — DIVALPROEX SODIUM 500 MG PO DR TAB: 500.0000 mg | DELAYED_RELEASE_TABLET | Freq: Two times a day (BID) | ORAL | Status: DC

## 2022-11-03 MED ORDER — SODIUM CHLORIDE 0.9% IV SOLUTION: Freq: Once | INTRAVENOUS | Status: AC

## 2022-11-03 MED ORDER — PHENYTOIN SODIUM 50 MG/ML IJ SOLN: 50.0000 mg | Freq: Three times a day (TID) | INTRAMUSCULAR | Status: DC

## 2022-11-03 MED ORDER — SODIUM CHLORIDE 0.9 % IV SOLN
350.0000 mg | Freq: Once | INTRAVENOUS | Status: AC
  Administered 2022-11-03: 350 mg via INTRAVENOUS
  Filled 2022-11-03: qty 7

## 2022-11-03 MED ORDER — LORAZEPAM 2 MG/ML IJ SOLN
INTRAMUSCULAR | Status: AC
  Administered 2022-11-03: 2 mg via INTRAVENOUS
  Filled 2022-11-03: qty 1

## 2022-11-03 MED ORDER — PHENYTOIN SODIUM 50 MG/ML IJ SOLN
50.0000 mg | Freq: Three times a day (TID) | INTRAMUSCULAR | Status: DC
  Administered 2022-11-04 – 2022-11-09 (×16): 50 mg via INTRAVENOUS
  Filled 2022-11-03 (×18): qty 1

## 2022-11-03 MED ORDER — VALPROATE SODIUM 100 MG/ML IV SOLN
500.0000 mg | Freq: Two times a day (BID) | INTRAVENOUS | Status: DC
  Administered 2022-11-03 – 2022-11-04 (×2): 500 mg via INTRAVENOUS
  Filled 2022-11-03 (×3): qty 5

## 2022-11-03 MED ORDER — HEPARIN SODIUM (PORCINE) 1000 UNIT/ML DIALYSIS
20.0000 [IU]/kg | INTRAMUSCULAR | Status: DC | PRN
  Filled 2022-11-03: qty 2

## 2022-11-03 MED ORDER — LORAZEPAM 2 MG/ML IJ SOLN: 2.0000 mg | INTRAMUSCULAR | Status: DC | PRN

## 2022-11-03 MED ORDER — LORAZEPAM 2 MG/ML IJ SOLN: 2.0000 mg | Freq: Once | INTRAMUSCULAR | Status: AC

## 2022-11-03 NOTE — Progress Notes (Addendum)
     [facility] Progress Note  CC:  Nausea, vomiting, dysphagia  Subjective:  Just had rapid response.  Updated by the dialysis nurse.  Objective:  Vital signs in last 24 hours: Temp:  [97.6 F (36.4 C)-98.6 F (37 C)] 98.2 F (36.8 C) (01/25 1200) Pulse Rate:  [53-72] 60 (01/25 1200) Resp:  [14-21] 17 (01/25 1200) BP: (121-145)/(51-64) 134/55 (01/25 1200) SpO2:  [95 %-99 %] 99 % (01/25 1200) Weight:  [68.3 kg] 68.3 kg (01/25 0500) Last BM Date : 10/31/22 General:  Pale and ill-appearing.  Intake/Output from previous day: 01/24 0701 - 01/25 0700 In: 105.5 [I.V.:3; IV Piggyback:102.5] Out: -   Lab Results: Recent Labs    11/01/22 0705 11/03/22 0457  WBC 5.2 6.1  HGB 8.1* 7.7*  HCT 26.5* 25.2*  PLT 78* 71*   BMET Recent Labs    11/01/22 0705 11/02/22 0757 11/03/22 0457  NA 134* 136 134*  K 4.0 3.8 4.1  CL 100 98 96*  CO2 '24 28 25  '$ GLUCOSE 72 78 80  BUN 31* 21 32*  CREATININE 4.11* 2.95* 3.62*  CALCIUM 8.0* 7.5* 7.5*   LFT Recent Labs    11/03/22 0457  ALBUMIN 2.4*   Assessment / Plan: Nausea, vomiting, ? dysphagia Acute on chronic anemia:  Hgb 7.7 grams today.  S/p one unit PRBCs on 1/15 and supposed to get another unit today. Seizures:  Has been cleared by neurology for EGD for 1/26. ESRD on HD Acute on chronic hypoxic respiratory failure:  On home O2  -EGD is planned for 1/26 with Dr. Silverio Decamp, but I went to see her in dialysis and she had a rapid response called on her just before I got there.  She became hypotensive after 400 cc removed during dialysis and had to bolus 300 cc back again, eyes rolled back.   LOS: 9 days   Laban Emperor. Zehr  11/03/2022, 12:32 PM   Attending physician's note   I have taken a history, reviewed the chart and examined the patient. I performed a substantive portion of this encounter, including complete performance of at least one of the key components, in conjunction with the APP. I agree with the APP's  note, impression and recommendations.    Patient had rapid response event during dialysis and subsequently episode of seizure  Nausea, vomiting and anorexia is likely consequence of metabolic derangement and seizure disorder Will defer endoscopic evaluation until patient is stable and remains seizure-free  GI is available, please call us back if needed     K. Denzil Magnuson , MD [phone]

## 2022-11-03 NOTE — Progress Notes (Signed)
Called RR nurse, per RR, run blood at 999 and recheck when complete. RR to call back to reassessment.   11/03/22 1930  Vitals  Pulse Rate (!) 49  ECG Heart Rate (!) 53  Resp 18  BP (!) 75/36  Oxygen Therapy  SpO2 92 %

## 2022-11-03 NOTE — Progress Notes (Signed)
Patient had an episode of near syncope during HD due to low blood pressure, this has resolved after receiving IV fluid bolus, and HD was aborted, upon patient returned to the floor,  she had a seizure event which was captured on LTM EEG, she was given IV Ativan 2 mg with resolution of seizures, she remains somnolent, postictal after that event. -I have updated her brother by phone, informed him prognosis is currently guarded given her multiple recurrent seizures, near syncopal events, unable to do HD, very poor oral intake given her persistent nausea. Phillips Climes MD

## 2022-11-03 NOTE — Progress Notes (Addendum)
Transported patient to hemodialysis with EEG equipment.

## 2022-11-03 NOTE — Progress Notes (Addendum)
MB arrived to Dialysis Unit to Transfer patient back to 5W25. I went ahead of transport with EEG equipment to get setup. Upon Patient arriving through door way to her room, she verbalized she was feeling as she was about to have a seizure, she stated this in broken words and choppy language. I continued urgently to get Unit started and online ASAP, called Neurology to inform as well. I was able to capture the tail end of the event while Floor MD and Nurses were in room. Floor MD was speaking to Neurology during this time as well. I also called [facility] to Notify that patient was back in her regular room and back from Dialysis for continued monitoring.

## 2022-11-03 NOTE — Progress Notes (Addendum)
[facility] Progress Note   Subjective:  Seen in room. No new events. For dialysis today    Objective Vitals:   11/03/22 0500 11/03/22 0530 11/03/22 0800 11/03/22 1126  BP:  121/60 (!) 124/51   Pulse:  (!) 59 (!) 59 (!) 58  Resp:  '16 18 14  '$ Temp:  98 F (36.7 C) 98.6 F (37 C)   TempSrc:  Axillary Oral   SpO2:    97%  Weight: 68.3 kg     Height:       Physical Exam General: chronically ill appearing female in NAD Heart: RRR, no mrg Lungs: CTAB anterolaterally, nml WOB on 2L O2 via [place] Abdomen: soft, NTND Extremities:trace LE edema  Dialysis Access: RU AVF +b/t   Filed Weights   10/29/22 1300 11/02/22 0500 11/03/22 0500  Weight: 62.7 kg 63.5 kg 68.3 kg    Intake/Output Summary (Last 24 hours) at 11/03/2022 1202 Last data filed at 11/02/2022 2257 Gross per 24 hour  Intake 105.5 ml  Output --  Net 105.5 ml     Additional Objective Labs: Basic Metabolic Panel: Recent Labs  Lab 11/01/22 0705 11/02/22 0757 11/03/22 0457  NA 134* 136 134*  K 4.0 3.8 4.1  CL 100 98 96*  CO2 '24 28 25  '$ GLUCOSE 72 78 80  BUN 31* 21 32*  CREATININE 4.11* 2.95* 3.62*  CALCIUM 8.0* 7.5* 7.5*  PHOS 3.6 2.9 3.6    Liver Function Tests: Recent Labs  Lab 11/01/22 0705 11/02/22 0757 11/03/22 0457  ALBUMIN 2.5* 2.4* 2.4*    CBC: Recent Labs  Lab 10/28/22 0507 10/29/22 0418 10/29/22 1029 10/30/22 0233 11/01/22 0705 11/03/22 0457  WBC 4.2 4.2 5.0 5.0 5.2 6.1  NEUTROABS 3.0 3.4  --  4.2  --   --   HGB 8.4* 8.1* 8.0* 8.7* 8.1* 7.7*  HCT 26.7* 26.1* 26.2* 27.4* 26.5* 25.2*  MCV 90.2 90.0 90.0 88.7 90.4 88.7  PLT 69* 68* 65* 72* 78* 71*    CBG: Recent Labs  Lab 11/02/22 0753 11/02/22 1204 11/02/22 2134 11/03/22 0747 11/03/22 0803  GLUCAP 84 80 81 67* 71     Studies/Results: No results found.  Medications:  anticoagulant sodium citrate     levETIRAcetam 250 mg (10/31/22 2254)   levETIRAcetam 250 mg (11/03/22 0855)   valproate sodium 500 mg  (11/03/22 0921)    sodium chloride   Intravenous Once   aspirin  81 mg Oral Daily   atorvastatin  40 mg Oral Daily   buprenorphine  1 patch Transdermal Weekly   calcitRIOL  1.25 mcg Oral Q M,W,F-HD   camphor-menthol  1 Application Topical Daily   carbidopa-levodopa  2 tablet Oral q AM   And   carbidopa-levodopa  1 tablet Oral BID   Chlorhexidine Gluconate Cloth  6 each Topical Q0600   darbepoetin (ARANESP) injection - DIALYSIS  200 mcg Subcutaneous Q Fri-1800   divalproex  500 mg Oral Q12H   feeding supplement (NEPRO CARB STEADY)  237 mL Oral TID BM   melatonin  5 mg Oral QHS   midodrine  20 mg Oral Q M,W,F-HD   mirtazapine  15 mg Oral QHS   Muscle Rub   Topical TID   nystatin  1 Application Topical Daily   pantoprazole (PROTONIX) IV  40 mg Intravenous Q12H   rOPINIRole  12 mg Oral Daily   sodium chloride flush  3 mL Intravenous Q12H   tamsulosin  0.4 mg Oral QHS    Dialysis  Orders: [facility] MWF 3:15h  67.9 kg 3K/2Ca  AVF Hep 3000 Mircera 279mg - last given 10/09/22 Venofer 50 qwk   Assessment/Plan: 1. Myoclonus/ jerking: initially thought possibly gabapentin toxicity- CT head negative.  Electrolytes OK.  Stopped gabapentin. Now wondering if all related to #2 MRI  - no acute intracranial findings, incomplete image mass/lesion in left neck. EEG w/mild-mod encephalopathy, non specific etiology. No seizure activity noted.  2 Seizure:  First seizure 1/16, endo cancelled, loaded with Keppra, neuro following.  Another seizure on dialysis 1/17.  Stopped with 2 mg IV ativan.  EEG negative. Per neuro seizure likely d/t underlying stroke. Had more breakthrough seizures this week but did not tolerate Vimpat load.  3. Diverticulitis of L colon - seen on CT.  For EGD and colonoscopy once stable.  Per GI 3 ESRD: MWF [facility]. Off schedule. Had HD 1/23,  will keep off schedule this week. Next HD 1/25. Addendum: Had another syncopal event during HD 1/25. Agonal breathing. Rapid response called.   4  Hypertension: BP/volume ok.  Continue home meds. Close to dry, UF as tolerated.  5. Anemia of ESRD: Hgb 8.1>7.7 , s/p 1 u pRBCs 1/15.  Aranesp 2016m qFri 6. Metabolic Bone Disease - CCa, phos, Mg in goal.  Continue calcitriol. 7. Nutrition - Renal diet w/fluid restrictions. 8. Nausea - on antiemetics.   OgLynnda ChildA-C [facility] 11/03/2022,12:02 PM

## 2022-11-03 NOTE — Progress Notes (Signed)
Received patient in bed to unit.  Alert and oriented.  Informed consent signed and in chart.   Treatment initiated: 1433 Treatment completed: 1544  Patient tolerated well.  Transported back to the room  Alert, without acute distress.  Hand-off given to patient's nurse.   Access used: fistula Access issues: none  Total UF removed: 100 Medication(s) given: none Post HD VS: none Post HD weight: 97.7, 150/39(68), HR-63, RR-22, Sp02-93  Patients BP began to drop at 1539= 86/44(56). In the midst of retaking the BP I gave a saline bolus of 100cc and stopped pulling fluids. BP came back 69/33(44). Mrs. Tax's eyes started to roll to the back of her head and she started to agonal breath. Her HR went down to the 30's. I put pt in trendelenburg and kept giving boluses as my charge nurse called rapid. My ADON helped me out with stabilizing my pt while rapid made their way to [place]. Got her BP back up as well as her HR and breathing.  1539- [id number] 1541-69/33(44) 1543-73/32(45) [id number]/30(16)  Treatment was stopped early. Verbal given by Shirlee Limerick, PA Report given to Valene Bors, RN EEG team and Transport took her back to her room. No seizure activity during this stay. PT is DNR    [facility]

## 2022-11-03 NOTE — Progress Notes (Addendum)
TRH night cross cover note:   I was notified by RN that this patient, who appears to be here with recurrent seizures as well as suspected acute gastrointestinal bleed, is having some low blood pressures that started during this evening's hemodialysis session.  Hemodialysis session was cut short as a consequence of development of hypotension and she was started on transfusion of 1 unit packed red blood cell in the context of most recent hemoglobin of 7.7 when checked on the morning of 11/03/2022, which is relative to most recent prior value of 8.1 on 11/01/2022.  She has also had recurrent seizures throughout her hospital course and RN conveyed that in similar fashion she experienced an additional seizure this evening, and is having some postictal confusion, and is currently unable to take her scheduled midodrine.  Neurology following in regards to her recurrent seizures.  Per my brief chart review, including review of her currently ordered medications, will hold existing order for Flomax for potential antihypertensive implications in this patient with report of low blood pressures.  In light of the above, have ordered a second unit PRBC to be transfused, along with her for repeat H&H to be checked following completion of transfusion of the second unit PRBC.  I have asked to be notified of the updated H&H following completion of transfusion of the second unit PRBC.   Update: Following transfusion 1 unit PRBC and completion of approximately 1/3 of second unit prbc transfusion, SBP has shown some improvement, increased from the 70's to the high 80's mmHg after briefly improving improving into the low 100's mmHg. HR stable in the 50's to 60's, consistent with heart rates throughout the day.  Oxygen saturation high 90s on 3 L nasal cannula.  In evaluating for additional contributions towards the patient's hypotension, have also ordered EKG.  There is also an existing order for echocardiogram.  Will follow closely  for result repeat hemoglobin level to be checked following completion of transfusion second unit PRBC.  I have asked patient's RN visit being on patient's blood pressure following completion of transfusion of second unit PRBC.     Update: updated VS following completion of transfusion of 2nd unit PRBC: BP 110/75 a/w MAP 86 mmHg, with current HR 67. Will follow for result of post-transfusion H&H.     Update: SBP now a little bit low: 80's - 90's mmHg, HR's 60's - 70's' no interval increase in supplemental O2 demands. H&H collected post transfusion of 2 units prbc reflects Hgb of 7.4 compared to most recent prior Hgb of 7.7 on the AM of 1/25, in spite of the interval 2 units PRBC. Given borderline soft BP with this Hgb trend, in context suspected GIB, and no current e/o of acute respiratory distress, I have ordered transfusion of an additional unit prbc at this time, along with order for repeat H&H to be checked following completion of transfusion of this unit.     Babs Bertin, DO Hospitalist

## 2022-11-03 NOTE — Procedures (Signed)
Patient Name: Jean Mcclain  MRN: [id number]  Epilepsy Attending: Lora Havens  Referring Physician/Provider: Lora Havens, MD  Duration: 11/02/2022 0915 to 11/03/2022 0915   Patient history: 77 year old female with seizure-like episodes.  EEG to evaluate for seizure.   Level of alertness: Awake, asleep   AEDs during EEG study: VPA   Technical aspects: This EEG study was done with scalp electrodes positioned according to the 10-20 International system of electrode placement. Electrical activity was reviewed with band pass filter of 1-'70Hz'$ , sensitivity of 7 uV/mm, display speed of 20m/sec with a '60Hz'$  notched filter applied as appropriate. EEG data were recorded continuously and digitally stored.  Video monitoring was available and reviewed as appropriate.   Description: The posterior dominant rhythm consists of '8Hz'$  activity of moderate voltage (25-35 uV) seen predominantly in posterior head regions, symmetric and reactive to eye opening and eye closing. Sleep was characterized by vertex waves, sleep spindles (12 to 14 Hz), maximal frontocentral region. EEG showed continuous generalized predominantly 5 to 7 Hz theta slowing admixed with 2 to 3 Hz generalized delta slowing. Hyperventilation and photic stimulation were not performed.      ABNORMALITY -Continuous slow, generalized   IMPRESSION: This study is suggestive of moderate diffuse encephalopathy, nonspecific etiology.  No seizures or epileptiform discharges were seen throughout the recording.   Austan Nicholl [name]

## 2022-11-03 NOTE — Progress Notes (Signed)
PROGRESS NOTE    Jean Mcclain  QIO:[id number] DOB: [birth year] DOA: 10/24/2022 PCP: Pcp, No   Chief Complaint  Patient presents with   Shaking    Brief Narrative:   Overnight: NAEON, Pt with multiple seizures yesterday and 1 episode where pt thought to have lost pulse, appears to be 2/2 to vasovagal 2/2 seizure like activity.    Patient states her chest was hurting the same.  Her nausea is better.  No other acute concerns endorsed.  On 10/31/2022, she had brief episode of unresponsiveness, CODE BLUE was called, but she never lost pulse, she had brief CPR performed.  Assessment & Plan:   Principal Problem:   Symptomatic anemia Active Problems:   Jerking   Acute diverticulitis   Acute on chronic respiratory failure with hypoxia (HCC)   Nausea   ESRD on hemodialysis (HCC)   Diabetes mellitus due to underlying condition, controlled, with stage 2 chronic kidney disease, with long-term current use of insulin (HCC)   Thrombocytopenia (HCC)   History of DVT (deep vein thrombosis)   Parkinson's disease   Abnormal TSH   Kidney lesion   Abnormal CT scan, gastrointestinal tract   Abnormal movements   Anemia   Seizure (HCC)      Symptomatic acute on chronic anemia.  Possible occult GI blood loss with recent history of diverticulitis and pending outpatient colonoscopy.   -GI is following, plan for endoscopy once medically stable and now has no further seizures.  He is seizure-free currently, have discussed with GI, likely this will be arranged for tomorrow -Hemoglobin today at 7.7, will give 1 unit PRBC. -Aranesp and IV iron per renal  New onset seizures 2 episodes in the hospital both witnessed.  -Management per neurology -Management per neurology, she remains on LTM EEG -Seizures most likely provoked by CVA. -AED management by neurology  ESRD on HD.  Patient on MWF schedule. But she is off schedule during hospital stay.   Incidental Neck Mass  Noted on MRI. CT soft  tissue neck shows mass that is most likely benign and most consistent with dermal inclusion cyst.  -PCP to monitor outpatient   Acute on chronic hypoxic respiratory failure with underlying OSA.  Resolved. At home on 3 L and here on 2-3 L of oxygen.  Likely increased initially due to low hgb.    History of DVT in 2018.  Not on anticoagulation anymore, repeat lower extremity venous ultrasound ordered during admission remains negative.   History of Parkinson's disease.  Continue home regimen.   History of sick euthyroid syndrome.  Stable TSH now.   Mild persistent thrombocytopenia.  Up-trending. Will continue to monitor. Not on heparin. DIC panel negative and smear showed no schistocytes. On keppra which can cause thrombocytopenia. Will continue to monitor closely with daily labs.    Kidney lesion - Noted to be increased in size on CT scan from 07/2022 for which MRI of the abdomen recommended in 6 months at that time, PCP to schedule it for April 2024.   DM type II.  A1c is 6.9 in 07/2022 . Sliding scale discontinued given hypoglycemia yesterday. CBG within range.    DVT prophylaxis:SCD Code Status: DNR Family Communication: None at bedside, gust with daughter by phone 1/24 Disposition:   Status is: Inpatient    Consultants:  GI Renal Neurology   Subjective:   she remains with poor appetite, she does report nausea  Objective: Vitals:   11/03/22 0800 11/03/22 1126 11/03/22 1200 11/03/22 1424  BP: Marland Kitchen)  124/51  (!) 134/55 (!) 139/47  Pulse: (!) 59 (!) 58 60 63  Resp: '18 14 17 18  '$ Temp: 98.6 F (37 C)  98.2 F (36.8 C) 98.7 F (37.1 C)  TempSrc: Oral  Oral Axillary  SpO2:  97% 99% 91%  Weight:      Height:        Intake/Output Summary (Last 24 hours) at 11/03/2022 1446 Last data filed at 11/02/2022 2257 Gross per 24 hour  Intake 105.5 ml  Output --  Net 105.5 ml   Filed Weights   10/29/22 1300 11/02/22 0500 11/03/22 0500  Weight: 62.7 kg 63.5 kg 68.3 kg     Examination:  Awake Alert, Oriented X 3, extremely frail, deconditioned, currently ill-appearing Symmetrical Chest wall movement, Good air movement bilaterally, RRR,No Gallops,Rubs or new Murmurs, No Parasternal Heave, he has reproducible chest pain on palpation +ve B.Sounds, Abd Soft, No tenderness, No rebound - guarding or rigidity. No Cyanosis, Clubbing , trace  edema, No new Rash or bruise       Data Reviewed: I have personally reviewed following labs and imaging studies  CBC: Recent Labs  Lab 10/28/22 0507 10/29/22 0418 10/29/22 1029 10/30/22 0233 11/01/22 0705 11/03/22 0457  WBC 4.2 4.2 5.0 5.0 5.2 6.1  NEUTROABS 3.0 3.4  --  4.2  --   --   HGB 8.4* 8.1* 8.0* 8.7* 8.1* 7.7*  HCT 26.7* 26.1* 26.2* 27.4* 26.5* 25.2*  MCV 90.2 90.0 90.0 88.7 90.4 88.7  PLT 69* 68* 65* 72* 78* 71*    Basic Metabolic Panel: Recent Labs  Lab 10/28/22 0507 10/29/22 0418 10/30/22 0233 10/31/22 0252 11/01/22 0705 11/02/22 0757 11/03/22 0457  NA 134* 133* 132* 133* 134* 136 134*  K 3.8 4.2 3.7 3.9 4.0 3.8 4.1  CL 97* 96* 97* 98 100 98 96*  CO2 '28 26 26 28 24 28 25  '$ GLUCOSE 66* 69* 105* 95 72 78 80  BUN '16 22 11 19 '$ 31* 21 32*  CREATININE 2.94* 3.62* 2.17* 3.19* 4.11* 2.95* 3.62*  CALCIUM 7.8* 8.2* 7.9* 8.0* 8.0* 7.5* 7.5*  MG 1.8 1.8  --   --   --   --   --   PHOS 2.7 3.4 2.5 3.1 3.6 2.9 3.6    GFR: Estimated Creatinine Clearance: 11.4 mL/min (A) (by C-G formula based on SCr of 3.62 mg/dL (H)).  Liver Function Tests: Recent Labs  Lab 10/30/22 0233 10/31/22 0252 11/01/22 0705 11/02/22 0757 11/03/22 0457  ALBUMIN 2.5* 2.4* 2.5* 2.4* 2.4*    CBG: Recent Labs  Lab 11/02/22 1204 11/02/22 2134 11/03/22 0747 11/03/22 0803 11/03/22 1210  GLUCAP 80 81 67* 71 81     No results found for this or any previous visit (from the past 240 hour(s)).       Radiology Studies: No results found.      Scheduled Meds:  sodium chloride   Intravenous Once   aspirin   81 mg Oral Daily   atorvastatin  40 mg Oral Daily   buprenorphine  1 patch Transdermal Weekly   calcitRIOL  1.25 mcg Oral Q M,W,F-HD   camphor-menthol  1 Application Topical Daily   carbidopa-levodopa  2 tablet Oral q AM   And   carbidopa-levodopa  1 tablet Oral BID   Chlorhexidine Gluconate Cloth  6 each Topical Q0600   darbepoetin (ARANESP) injection - DIALYSIS  200 mcg Subcutaneous Q Fri-1800   divalproex  500 mg Oral Q12H   feeding supplement (NEPRO CARB STEADY)  237 mL Oral TID BM   melatonin  5 mg Oral QHS   midodrine  20 mg Oral Q M,W,F-HD   mirtazapine  15 mg Oral QHS   Muscle Rub   Topical TID   nystatin  1 Application Topical Daily   pantoprazole (PROTONIX) IV  40 mg Intravenous Q12H   rOPINIRole  12 mg Oral Daily   sodium chloride flush  3 mL Intravenous Q12H   tamsulosin  0.4 mg Oral QHS   Continuous Infusions:  anticoagulant sodium citrate     levETIRAcetam 250 mg (11/03/22 0855)   valproate sodium       LOS: 9 days        Phillips Climes, MD [facility]   To contact the attending provider between 7A-7P or the covering provider during after hours 7P-7A, please log into the web site [email or web address] and access using universal [place] password for that web site. If you do not have the password, please call the hospital operator.  11/03/2022, 2:46 PM

## 2022-11-03 NOTE — Significant Event (Signed)
Rapid Response Event Note   Reason for Call :  Hypotension 69/33 during HD treatment. At that time pt had approximately 400cc fluid removal. 300cc fluid given back by HD RN. BP 133/43.  Initial Focused Assessment:  Pt lying in bed, drowsy. Oriented. Lung sounds are clear. No distress. She denies lightheaded or dizziness. She endorses nausea. Abdomen is soft. Skin is warm, moist to touch. Overall color is pale, improving. Peripheral pulses 2+.   Upper extremity movements are equal. Pt opens eyes and attenuates to RN. No seizure like activity was noted by HD staff.   VS: T 97.56F, BP 133/43, HR 71, RR 22, SpO2 97% on room air CBG: 77  Interventions:  -PRN Zofran given  Plan of Care:  -Dialysis terminated, per nephrology PA  Call rapid response for additional needs  Event Summary:  MD Notified: Dr. Waldron Labs Call Time: 8110 Arrival Time: 3159 End Time: [place], RN

## 2022-11-03 NOTE — Progress Notes (Addendum)
Subjective: No further seizures.  Continues to report significant nausea.  ROS: negative except above  Examination  Vital signs in last 24 hours: Temp:  [97.6 F (36.4 C)-98.6 F (37 C)] 98.2 F (36.8 C) (01/25 1200) Pulse Rate:  [57-72] 60 (01/25 1200) Resp:  [14-21] 17 (01/25 1200) BP: (121-145)/(51-64) 134/55 (01/25 1200) SpO2:  [95 %-99 %] 99 % (01/25 1200) Weight:  [68.3 kg] 68.3 kg (01/25 0500)  General: lying in bed, NAD Neuro: MS: Alert, oriented, follows commands CN: pupils equal and reactive,  EOMI, face symmetric, tongue midline, normal sensation over face, Motor: 4+/5 strength in all 4 extremities  Basic Metabolic Panel: Recent Labs  Lab 10/28/22 0507 10/29/22 0418 10/30/22 0233 10/31/22 0252 11/01/22 0705 11/02/22 0757 11/03/22 0457  NA 134* 133* 132* 133* 134* 136 134*  K 3.8 4.2 3.7 3.9 4.0 3.8 4.1  CL 97* 96* 97* 98 100 98 96*  CO2 '28 26 26 28 24 28 25  '$ GLUCOSE 66* 69* 105* 95 72 78 80  BUN '16 22 11 19 '$ 31* 21 32*  CREATININE 2.94* 3.62* 2.17* 3.19* 4.11* 2.95* 3.62*  CALCIUM 7.8* 8.2* 7.9* 8.0* 8.0* 7.5* 7.5*  MG 1.8 1.8  --   --   --   --   --   PHOS 2.7 3.4 2.5 3.1 3.6 2.9 3.6    CBC: Recent Labs  Lab 10/28/22 0507 10/29/22 0418 10/29/22 1029 10/30/22 0233 11/01/22 0705 11/03/22 0457  WBC 4.2 4.2 5.0 5.0 5.2 6.1  NEUTROABS 3.0 3.4  --  4.2  --   --   HGB 8.4* 8.1* 8.0* 8.7* 8.1* 7.7*  HCT 26.7* 26.1* 26.2* 27.4* 26.5* 25.2*  MCV 90.2 90.0 90.0 88.7 90.4 88.7  PLT 69* 68* 65* 72* 78* 71*    Coagulation Studies: No results for input(s): "LABPROT", "INR" in the last 72 hours.  Imaging No new brain imaging overnight   ASSESSMENT AND PLAN: 77 year old wheelchair-bound female with history of hypertension, hyperlipidemia, diabetes, end-stage renal disease on hemodialysis parkinsonism, RLS and myoclonus who has been having multiple seizure-like episodes.   New onset focal epilepsy Chronic stroke, left parietal Acute encephalopathy,  resolved Thrombocytopenia - seizure likely due to underlying stroke.  -Could be secondary to [place] as patient came in with normal platelets and platelet count dropped after having Keppra. Depakote was added but has been stable.   Recommendations -On Keppra '250mg'$  BID. Stopping the post HD dose of Keppra. If platelets do not improve, may need to completely stop eventually -Continue Depakote 500 mg twice daily.   -Did not tolerate Vimpat due to bradycardia with heart rate in the 40s and PR interval 157m on 10/31/2022 -Discontinue LTM EEG later today patient is going for endoscopy tomorrow morning.  May need to rehook after endoscopy with plans to stop Keppra altogether because of thrombocytopenia -Will move morning dose of Depakote earlier tomorrow if patient goes for endoscopy -Of note, Depakote can also cause thrombocytopenia but patient's platelets dropped before adding Depakote and has been stable since. - PTN IV versed for seizure lasting over 5 minutes.  - Continue seizure precautions - Management of rest of comorbidities per primary team  Addendum - Had an event of hypotension during dialysis at 1543. Concomitant eeg showed diffuse attenuation, no ictal-interictal abnormality - On the way back, had seizure described as left gaze deviation, left upper extremity jerking. Patient was off eeg at the beginning as she was being transferred. Once eeg resumed, it was difficult to interpret due to  significant myogenic artifact. However, the vidoe appeared concerning for focal motor seizures. Unfortunately, patient has had thrombocytopenia since being on keppra and had breakthrough seizure even on 500 mg BID of keppra which was max renal adjusted dose so I am hesitant to increase from '250mg'$  BID to '500mg'$  BID. Depakote can also worsen thrombocytopenia. So will load with cerebryx '5mg'$ /kg once and start dilantin '50mg'$  TID.    I have spent a total of  50 minutes with the patient reviewing hospital notes,   test results, labs and examining the patient as well as establishing an assessment and plan.  > 50% of time was spent in direct patient care.   Zeb Comfort Epilepsy [facility] For questions after 5pm please refer to AMION to reach the Neurologist on call

## 2022-11-04 ENCOUNTER — Inpatient Hospital Stay (HOSPITAL_COMMUNITY): Payer: PPO

## 2022-11-04 ENCOUNTER — Encounter (HOSPITAL_COMMUNITY)
Admission: EM | Disposition: A | Discharge status: Hospice | Payer: Self-pay | Source: Skilled Nursing Facility | Attending: Internal Medicine

## 2022-11-04 DIAGNOSIS — R112 Nausea with vomiting, unspecified: Secondary | ICD-10-CM | POA: Diagnosis not present

## 2022-11-04 DIAGNOSIS — D649 Anemia, unspecified: Secondary | ICD-10-CM | POA: Diagnosis not present

## 2022-11-04 DIAGNOSIS — R55 Syncope and collapse: Secondary | ICD-10-CM | POA: Diagnosis not present

## 2022-11-04 DIAGNOSIS — Z515 Encounter for palliative care: Secondary | ICD-10-CM

## 2022-11-04 DIAGNOSIS — N186 End stage renal disease: Secondary | ICD-10-CM | POA: Diagnosis not present

## 2022-11-04 DIAGNOSIS — R569 Unspecified convulsions: Secondary | ICD-10-CM | POA: Diagnosis not present

## 2022-11-04 DIAGNOSIS — Z992 Dependence on renal dialysis: Secondary | ICD-10-CM | POA: Diagnosis not present

## 2022-11-04 LAB — PREPARE RBC (CROSSMATCH)

## 2022-11-04 LAB — RENAL FUNCTION PANEL
Albumin: 2.3 g/dL — ABNORMAL LOW (ref 3.5–5.0)
Anion gap: 9 (ref 5–15)
BUN: 30 mg/dL — ABNORMAL HIGH (ref 8–23)
CO2: 26 mmol/L (ref 22–32)
Calcium: 7.5 mg/dL — ABNORMAL LOW (ref 8.9–10.3)
Chloride: 101 mmol/L (ref 98–111)
Creatinine, Ser: 3.3 mg/dL — ABNORMAL HIGH (ref 0.44–1.00)
GFR, Estimated: 14 mL/min — ABNORMAL LOW (ref >60)
Glucose, Bld: 102 mg/dL — ABNORMAL HIGH (ref 70–99)
Phosphorus: 3.9 mg/dL (ref 2.5–4.6)
Potassium: 4.1 mmol/L (ref 3.5–5.1)
Sodium: 136 mmol/L (ref 135–145)

## 2022-11-04 LAB — CBC
HCT: 24.8 % — ABNORMAL LOW (ref 36.0–46.0)
Hemoglobin: 7.4 g/dL — ABNORMAL LOW (ref 12.0–15.0)
MCH: 26.8 pg (ref 26.0–34.0)
MCHC: 29.8 g/dL — ABNORMAL LOW (ref 30.0–36.0)
MCV: 89.9 fL (ref 80.0–100.0)
Platelets: 65 10*3/uL — ABNORMAL LOW (ref 150–400)
RBC: 2.76 MIL/uL — ABNORMAL LOW (ref 3.87–5.11)
RDW: 18.1 % — ABNORMAL HIGH (ref 11.5–15.5)
WBC: 5.4 10*3/uL (ref 4.0–10.5)
nRBC: 0 % (ref 0.0–0.2)

## 2022-11-04 LAB — ECHOCARDIOGRAM COMPLETE
Area-P 1/2: 2.56 cm2
Height: 60 in
MV VTI: 1.48 cm2
S' Lateral: 2.2 cm
Weight: 2416.24 oz

## 2022-11-04 LAB — GLUCOSE, CAPILLARY
Glucose-Capillary: 81 mg/dL (ref 70–99)
Glucose-Capillary: 72 mg/dL (ref 70–99)
Glucose-Capillary: 79 mg/dL (ref 70–99)
Glucose-Capillary: 74 mg/dL (ref 70–99)

## 2022-11-04 LAB — HEMOGLOBIN AND HEMATOCRIT, BLOOD
HCT: 25 % — ABNORMAL LOW (ref 36.0–46.0)
HCT: 23.3 % — ABNORMAL LOW (ref 36.0–46.0)
Hemoglobin: 8 g/dL — ABNORMAL LOW (ref 12.0–15.0)
Hemoglobin: 7.5 g/dL — ABNORMAL LOW (ref 12.0–15.0)

## 2022-11-04 SURGERY — CANCELLED PROCEDURE

## 2022-11-04 MED ORDER — SODIUM CHLORIDE 0.9% IV SOLUTION: Freq: Once | INTRAVENOUS | Status: AC

## 2022-11-04 MED ORDER — MIDODRINE HCL 5 MG PO TABS
10.0000 mg | ORAL_TABLET | Freq: Three times a day (TID) | ORAL | Status: DC
  Administered 2022-11-05 – 2022-11-06 (×2): 10 mg via ORAL
  Filled 2022-11-04 (×3): qty 2

## 2022-11-04 SURGICAL SUPPLY — 15 items

## 2022-11-04 NOTE — Progress Notes (Signed)
vLTM maintenance  All impedances below 10kohms.  No skin breakdown noted at [place]

## 2022-11-04 NOTE — Progress Notes (Addendum)
[facility] Progress Note   Subjective: Seen in room, family and neurology present. She responds to verbal stimuli, follows simple commands.   Overnight events noted.   Objective Vitals:   11/04/22 0411 11/04/22 0515 11/04/22 0800 11/04/22 1200  BP:  (!) 112/54 (!) 101/54 (!) 112/97  Pulse:  68 64 74  Resp:  13 16 (!) 22  Temp:  97.6 F (36.4 C)  97.7 F (36.5 C)  TempSrc:  Axillary  Oral  SpO2:  96% 100% 97%  Weight: 68.5 kg     Height:       Physical Exam General: Chronically ill appearing female in NAD Heart: S1,S2 No M/R/G Lungs: CTAB, decreased in bases.  Abdomen: Soft NABS Extremities: No LE edema Dialysis Access: R AVF + T/B   Additional Objective Labs: Basic Metabolic Panel: Recent Labs  Lab 11/02/22 0757 11/03/22 0457 11/04/22 0051  NA 136 134* 136  K 3.8 4.1 4.1  CL 98 96* 101  CO2 '28 25 26  '$ GLUCOSE 78 80 102*  BUN 21 32* 30*  CREATININE 2.95* 3.62* 3.30*  CALCIUM 7.5* 7.5* 7.5*  PHOS 2.9 3.6 3.9   Liver Function Tests: Recent Labs  Lab 11/02/22 0757 11/03/22 0457 11/04/22 0051  ALBUMIN 2.4* 2.4* 2.3*   No results for input(s): "LIPASE", "AMYLASE" in the last 168 hours. CBC: Recent Labs  Lab 10/29/22 0418 10/29/22 1029 10/30/22 0233 11/01/22 0705 11/03/22 0457 11/04/22 0051 11/04/22 0729  WBC 4.2 5.0 5.0 5.2 6.1 5.4  --   NEUTROABS 3.4  --  4.2  --   --   --   --   HGB 8.1* 8.0* 8.7* 8.1* 7.7* 7.4* 8.0*  HCT 26.1* 26.2* 27.4* 26.5* 25.2* 24.8* 25.0*  MCV 90.0 90.0 88.7 90.4 88.7 89.9  --   PLT 68* 65* 72* 78* 71* 65*  --    Blood Culture    Component Value Date/Time   SDES BLOOD LEFT FOREARM 09/29/2022 1716   SPECREQUEST  09/29/2022 1716    BOTTLES DRAWN AEROBIC AND ANAEROBIC Blood Culture adequate volume   CULT  09/29/2022 1716    NO GROWTH 5 DAYS Performed at [facility], [place] [address] [place], [place] [address]    REPTSTATUS 10/04/2022 FINAL 09/29/2022 1716    Cardiac Enzymes: No results for  input(s): "CKTOTAL", "CKMB", "CKMBINDEX", "TROPONINI" in the last 168 hours. CBG: Recent Labs  Lab 11/03/22 1725 11/03/22 1812 11/03/22 2132 11/04/22 0835 11/04/22 1144  GLUCAP 65* 105* 115* 81 72   Iron Studies: No results for input(s): "IRON", "TIBC", "TRANSFERRIN", "FERRITIN" in the last 72 hours. '@lablastinr3'$ @ Studies/Results: No results found. Medications:  anticoagulant sodium citrate      aspirin  81 mg Oral Daily   atorvastatin  40 mg Oral Daily   buprenorphine  1 patch Transdermal Weekly   calcitRIOL  1.25 mcg Oral Q M,W,F-HD   camphor-menthol  1 Application Topical Daily   carbidopa-levodopa  2 tablet Oral q AM   And   carbidopa-levodopa  1 tablet Oral BID   Chlorhexidine Gluconate Cloth  6 each Topical Q0600   darbepoetin (ARANESP) injection - DIALYSIS  200 mcg Subcutaneous Q Fri-1800   feeding supplement (NEPRO CARB STEADY)  237 mL Oral TID BM   melatonin  5 mg Oral QHS   midodrine  10 mg Oral TID WC   mirtazapine  15 mg Oral QHS   Muscle Rub   Topical TID   nystatin  1 Application Topical Daily  pantoprazole (PROTONIX) IV  40 mg Intravenous Q12H   phenytoin (DILANTIN) IV  50 mg Intravenous Q8H   rOPINIRole  12 mg Oral Daily   sodium chloride flush  3 mL Intravenous Q12H     Dialysis Orders: NW MWF 3:15h  67.9 kg 3K/2Ca  AVF  Hep 3000 units IV TIW Mircera 257mg IV q 2 weeks - last given 10/09/22 Venofer 50 mg IV qwk   Assessment/Plan: 1. Myoclonus/ jerking: initially thought possibly gabapentin toxicity- CT head negative.  Electrolytes OK.  Stopped gabapentin. Now wondering if all related to #2 MRI  - no acute intracranial findings, incomplete image mass/lesion in left neck. EEG w/mild-mod encephalopathy, non specific etiology. No seizure activity noted.  2 Seizure:  First seizure 1/16, endo cancelled, loaded with Keppra, neuro following.  Another seizure on dialysis 1/17.  Stopped with 2 mg IV ativan.  EEG negative. Per neuro seizure likely d/t  underlying stroke. Had more breakthrough seizures this week but did not tolerate. Neurology following, plans to load with dilantin.   3. Diverticulitis of L colon - seen on CT.  For EGD and colonoscopy once stable.  Per GI 3 ESRD: MWF [facility]. HD off schedule this week. Next HD 11/05/2022.  4 Hypotension: Syncopal epidisode/symptomatic hypotension post HD 11/03/2022. Improved after blood transfusion. Avoid hypotension. Run even 11/05/2022. HGB falling hold heparin with HD.  5. Anemia of ESRD: HGB 8 range. Now S/P 3 units PRBCS 01/25-01/26/2024. 4 units PRBC since admission. Follow HGB continue  Aranesp 2029m qFri 6. Metabolic Bone Disease - CCa, phos, Mg in goal.  Continue calcitriol. 7. Nutrition - NPO at present.  8. Nausea - on antiemetics.  9. GOC-patient is a DNR. No children. Family at bedside. Will order Palliative Care consult for goals of care.  Gen Clagg H. Annaleese Guier NP-C 11/04/2022, 12:27 PM  [phone]

## 2022-11-04 NOTE — Progress Notes (Signed)
PT Cancellation Note  Patient Details Name: Jean Mcclain MRN: [id number] DOB: February 04, 1946   Cancelled Treatment:    Reason Eval/Treat Not Completed: Fatigue/lethargy limiting ability to participate. Per discussion with RN, pt very lethargic and difficult to arouse at this time. PT will hold session at this time and follow up later as available/appropriate. Thank you.    Luvenia Heller 11/04/2022, 1:25 PM

## 2022-11-04 NOTE — Progress Notes (Signed)
   11/03/22 1945  Assess: MEWS Score  Temp 97.7 F (36.5 C)  BP (!) 81/35  MAP (mmHg) (!) 44  Pulse Rate 64  ECG Heart Rate 64  Resp 20  Level of Consciousness Responds to Pain  SpO2 92 %  O2 Device Nasal Cannula  Patient Activity (if Appropriate) In bed  O2 Flow Rate (L/min) 3 L/min  Assess: MEWS Score  MEWS Temp 0  MEWS Systolic 1  MEWS Pulse 0  MEWS RR 0  MEWS LOC 2  MEWS Score 3  MEWS Score Color Yellow  Assess: if the MEWS score is Yellow or Red  Were vital signs taken at a resting state? Yes  Focused Assessment Change from prior assessment (see assessment flowsheet)  Does the patient meet 2 or more of the SIRS criteria? No  MEWS guidelines implemented *See Row Information* Yes  Treat  MEWS Interventions Escalated (See documentation below)  Take Vital Signs  Increase Vital Sign Frequency  Yellow: Q 2hr X 2 then Q 4hr X 2, if remains yellow, continue Q 4hrs  Escalate  MEWS: Escalate Yellow: discuss with charge nurse/RN and consider discussing with provider and RRT  Notify: Charge Nurse/RN  Name of Charge Nurse/RN Notified Janie RN  Date Charge Nurse/RN Notified 11/03/22  Time Charge Nurse/RN Notified 1930  Provider Notification  Provider Name/Title J.Howerter,MD  Date Provider Notified 11/03/22  Time Provider Notified 1936  Method of Notification Call  Notification Reason Change in status  Provider response See new orders  Date of Provider Response 11/04/22  Time of Provider Response 1936  Notify: Rapid Response  Date Rapid Response Notified 11/03/22  Time Rapid Response Notified 1930  Document  Patient Outcome Not stable and remains on department  Assess: SIRS CRITERIA  SIRS Temperature  0  SIRS Pulse 0  SIRS Respirations  0  SIRS WBC 0  SIRS Score Sum  0

## 2022-11-04 NOTE — Progress Notes (Addendum)
PROGRESS NOTE    Jean Mcclain  HWE:[id number] DOB: 1945/11/29 DOA: 10/24/2022 PCP: Pcp, No   Chief Complaint  Patient presents with   Shaking    Brief Narrative:   Overnight: NAEON, Pt with multiple seizures yesterday and 1 episode where pt thought to have lost pulse, appears to be 2/2 to vasovagal 2/2 seizure like activity.  -Patient with prolonged hospital stay, she has been seen by neurology for her new onset seizures, which was felt secondary provoked by diagnosis of chronic CVA which happened over the last few month, as well she had couple of near syncopal events, with low blood pressure, she is having persistent nausea/vomiting over the last few months with PCM, has been seen by GI, with recommendation for endoscopy, but she has not been stable for that procedure .  On 10/31/2022, she had brief episode of unresponsiveness, CODE BLUE was called, but she never lost pulse, she had brief CPR performed.  Assessment & Plan:   Principal Problem:   Symptomatic anemia Active Problems:   Jerking   Acute diverticulitis   Acute on chronic respiratory failure with hypoxia (HCC)   Nausea and vomiting   ESRD on hemodialysis (HCC)   Diabetes mellitus due to underlying condition, controlled, with stage 2 chronic kidney disease, with long-term current use of insulin (HCC)   Thrombocytopenia (HCC)   History of DVT (deep vein thrombosis)   Parkinson's disease   Abnormal TSH   Kidney lesion   ESRD on dialysis (HCC)   Abnormal CT scan, gastrointestinal tract   Abnormal movements   Anemia   Seizure disorder (HCC)      Symptomatic acute on chronic blood loss anemia.    Nausea and vomiting - Possible occult GI blood loss with recent history of diverticulitis and pending outpatient colonoscopy.   -GI has been consulted, but recommendation has been for endoscopy, it has been counseled time to already given patient instability secondary to development of seizures and hypotension/near  syncope  -She required units PRBC transfusion overnight as she developed hypotension, so far on no BM to indicate GI bleed. -CT abdomen pelvis is pending to evaluate for other sources of bleed -So far received 4 units PRBC during hospital stay -Aranesp and IV iron per renal -She is with significant bruising in the left upper chest, shoulder and breast area, this would contribute to some significant blood loss as well  New onset seizures -Management per neurology -Management per neurology, she remains on LTM EEG -Seizures most likely provoked by CVA. -AED management by neurology -He had another seizure today which required IV Ativan.  ESRD on HD.  -HD was aborted yesterday given hypotension.   Incidental Neck Mass  Noted on MRI. CT soft tissue neck shows mass that is most likely benign and most consistent with dermal inclusion cyst.  -PCP to monitor outpatient   Acute on chronic hypoxic respiratory failure with underlying OSA.  Resolved. At home on 3 L and here on 2-3 L of oxygen.  Likely increased initially due to low hgb.    History of DVT in 2018.  Not on anticoagulation anymore, repeat lower extremity venous ultrasound ordered during admission remains negative.   History of Parkinson's disease.  Continue home regimen.   History of sick euthyroid syndrome.  Stable TSH now.   Mild persistent thrombocytopenia.  Up-trending. Will continue to monitor. Not on heparin. DIC panel negative and smear showed no schistocytes. On keppra which can cause thrombocytopenia. Will continue to monitor closely with daily  labs.     Severe PCM FTT Chronic debility -With albumin of 2.3, he is with very poor oral intake over last few months secondary to persistent nausea and vomiting  Kidney lesion - Noted to be increased in size on CT scan from 07/2022 for which MRI of the abdomen recommended in 6 months at that time, PCP to schedule it for April 2024.   DM type II.  A1c is 6.9 in 07/2022 . Sliding  scale discontinued given hypoglycemia yesterday. CBG within range.   Goals of care discussion:  Discussed with brother, sister-in-law and niece, I have discussed with them her overall prognosis is guarded, this is all stems from her borderline baseline as she has been having persistent nausea and vomiting over the last few months, with very poor oral intake, and very poor ambulatory at baseline where she is electric wheelchair dependent, with aligning ESRD, with very poor oral intake secondary to nausea, vomiting, and encephalopathy in the setting of recent diagnosis of CVA, and her seizure status, as well currently with significant anemia requiring multiple transfusions I have discussed with them palliative medicine consult, and for now we will continue with current measures    DVT prophylaxis:SCD Code Status: DNR Family Communication: Discussed with brother and niece at bedside Disposition:   Status is: Inpatient    Consultants:  GI Renal Neurology Palliative   Subjective:  Her dialysis was aborted yesterday given near syncope/hypotension, as well she had an episode of seizures on the floor which resolved with Ativan, but she remained somnolent since.  She received units PRBC overnight, Objective: Vitals:   11/04/22 0411 11/04/22 0515 11/04/22 0800 11/04/22 1200  BP:  (!) 112/54 (!) 101/54 (!) 112/97  Pulse:  68 64 74  Resp:  13 16 (!) 22  Temp:  97.6 F (36.4 C)  97.7 F (36.5 C)  TempSrc:  Axillary  Oral  SpO2:  96% 100% 97%  Weight: 68.5 kg     Height:        Intake/Output Summary (Last 24 hours) at 11/04/2022 1424 Last data filed at 11/04/2022 0515 Gross per 24 hour  Intake 1078.83 ml  Output --  Net 1078.83 ml   Filed Weights   11/03/22 1430 11/03/22 1600 11/04/22 0411  Weight: 63 kg 62.9 kg 68.5 kg    Examination:  He is somnolent today, wakes up, open her eyes, oriented to name only, severely deconditioned, extremely frail, ill-appearing Diminished air  entry at the bases, with reproducible chest pain to palpation in mid chest area Regular rate and rhythm +ve B.Sounds, Abd Soft, No tenderness, No rebound - guarding or rigidity. No Cyanosis, Clubbing , trace  edema, No new Rash or bruise       Data Reviewed: I have personally reviewed following labs and imaging studies  CBC: Recent Labs  Lab 10/29/22 0418 10/29/22 1029 10/30/22 0233 11/01/22 0705 11/03/22 0457 11/04/22 0051 11/04/22 0729  WBC 4.2 5.0 5.0 5.2 6.1 5.4  --   NEUTROABS 3.4  --  4.2  --   --   --   --   HGB 8.1* 8.0* 8.7* 8.1* 7.7* 7.4* 8.0*  HCT 26.1* 26.2* 27.4* 26.5* 25.2* 24.8* 25.0*  MCV 90.0 90.0 88.7 90.4 88.7 89.9  --   PLT 68* 65* 72* 78* 71* 65*  --     Basic Metabolic Panel: Recent Labs  Lab 10/29/22 0418 10/30/22 0233 10/31/22 0252 11/01/22 0705 11/02/22 0757 11/03/22 0457 11/04/22 0051  NA 133*   < >  133* 134* 136 134* 136  K 4.2   < > 3.9 4.0 3.8 4.1 4.1  CL 96*   < > 98 100 98 96* 101  CO2 26   < > '28 24 28 25 26  '$ GLUCOSE 69*   < > 95 72 78 80 102*  BUN 22   < > 19 31* 21 32* 30*  CREATININE 3.62*   < > 3.19* 4.11* 2.95* 3.62* 3.30*  CALCIUM 8.2*   < > 8.0* 8.0* 7.5* 7.5* 7.5*  MG 1.8  --   --   --   --   --   --   PHOS 3.4   < > 3.1 3.6 2.9 3.6 3.9   < > = values in this interval not displayed.    GFR: Estimated Creatinine Clearance: 12.5 mL/min (A) (by C-G formula based on SCr of 3.3 mg/dL (H)).  Liver Function Tests: Recent Labs  Lab 10/31/22 0252 11/01/22 0705 11/02/22 0757 11/03/22 0457 11/04/22 0051  ALBUMIN 2.4* 2.5* 2.4* 2.4* 2.3*    CBG: Recent Labs  Lab 11/03/22 1725 11/03/22 1812 11/03/22 2132 11/04/22 0835 11/04/22 1144  GLUCAP 65* 105* 115* 81 72     No results found for this or any previous visit (from the past 240 hour(s)).       Radiology Studies: ECHOCARDIOGRAM COMPLETE  Result Date: 11/04/2022    ECHOCARDIOGRAM REPORT   Patient Name:   VERDELLE VALTIERRA Date of Exam: 11/04/2022 Medical Rec  #:  [id number]        Height:       60.0 in Accession #:    [id number]       Weight:       151.0 lb Date of Birth:  Nov 03, 1945         BSA:          1.657 m Patient Age:    77 years         BP:           101/54 mmHg Patient Gender: F                HR:           70 bpm. Exam Location:  Inpatient Procedure: 2D Echo Indications:    syncope  History:        Patient has prior history of Echocardiogram examinations, most                 recent 07/22/2022. End stage renal disease. Parkinson's                 disease.; Risk Factors:Sleep Apnea and Diabetes.  Sonographer:    Johny Chess RDCS Referring Phys: [id number] [place]  1. Left ventricular ejection fraction, by estimation, is 60 to 65%. The left ventricle has normal function. The left ventricle has no regional wall motion abnormalities. There is mild left ventricular hypertrophy. Left ventricular diastolic parameters are consistent with Grade I diastolic dysfunction (impaired relaxation).  2. Right ventricular systolic function is normal. The right ventricular size is normal.  3. Left atrial size was mildly dilated.  4. The mitral valve is abnormal. Trivial mitral valve regurgitation. No evidence of mitral stenosis. Severe mitral annular calcification.  5. The aortic valve is tricuspid. There is moderate calcification of the aortic valve. There is moderate thickening of the aortic valve. Aortic valve regurgitation is not visualized. Aortic valve sclerosis/calcification is present, without any evidence of aortic stenosis.  6. The  inferior vena cava is normal in size with greater than 50% respiratory variability, suggesting right atrial pressure of 3 mmHg. FINDINGS  Left Ventricle: Left ventricular ejection fraction, by estimation, is 60 to 65%. The left ventricle has normal function. The left ventricle has no regional wall motion abnormalities. The left ventricular internal cavity size was normal in size. There is  mild left ventricular  hypertrophy. Left ventricular diastolic parameters are consistent with Grade I diastolic dysfunction (impaired relaxation). Right Ventricle: The right ventricular size is normal. No increase in right ventricular wall thickness. Right ventricular systolic function is normal. Left Atrium: Left atrial size was mildly dilated. Right Atrium: Right atrial size was normal in size. Pericardium: There is no evidence of pericardial effusion. Mitral Valve: The mitral valve is abnormal. There is mild thickening of the mitral valve leaflet(s). There is mild calcification of the mitral valve leaflet(s). Severe mitral annular calcification. Trivial mitral valve regurgitation. No evidence of mitral valve stenosis. MV peak gradient, 5.6 mmHg. The mean mitral valve gradient is 2.0 mmHg. Tricuspid Valve: The tricuspid valve is normal in structure. Tricuspid valve regurgitation is mild . No evidence of tricuspid stenosis. Aortic Valve: The aortic valve is tricuspid. There is moderate calcification of the aortic valve. There is moderate thickening of the aortic valve. Aortic valve regurgitation is not visualized. Aortic valve sclerosis/calcification is present, without any  evidence of aortic stenosis. Pulmonic Valve: The pulmonic valve was normal in structure. Pulmonic valve regurgitation is trivial. No evidence of pulmonic stenosis. Aorta: The aortic root is normal in size and structure. Venous: The inferior vena cava is normal in size with greater than 50% respiratory variability, suggesting right atrial pressure of 3 mmHg. IAS/Shunts: No atrial level shunt detected by color flow Doppler.  LEFT VENTRICLE PLAX 2D LVIDd:         3.90 cm   Diastology LVIDs:         2.20 cm   LV e' medial:    7.30 cm/s LV PW:         1.00 cm   LV E/e' medial:  12.6 LV IVS:        1.20 cm   LV e' lateral:   9.17 cm/s LVOT diam:     1.80 cm   LV E/e' lateral: 10.1 LV SV:         50 LV SV Index:   30 LVOT Area:     2.54 cm  RIGHT VENTRICLE             IVC  RV Basal diam:  2.60 cm     IVC diam: 1.10 cm RV S prime:     10.40 cm/s TAPSE (M-mode): 2.2 cm LEFT ATRIUM             Index        RIGHT ATRIUM           Index LA diam:        3.80 cm 2.29 cm/m   RA Area:     12.80 cm LA Vol (A2C):   68.2 ml 41.17 ml/m  RA Volume:   28.60 ml  17.27 ml/m LA Vol (A4C):   60.2 ml 36.34 ml/m LA Biplane Vol: 66.6 ml 40.20 ml/m  AORTIC VALVE LVOT Vmax:   89.60 cm/s LVOT Vmean:  58.400 cm/s LVOT VTI:    0.198 m  AORTA Ao Root diam: 2.90 cm Ao Asc diam:  3.20 cm MITRAL VALVE MV Area (PHT): 2.56 cm  SHUNTS MV Area VTI:   1.48 cm     Systemic VTI:  0.20 m MV Peak grad:  5.6 mmHg     Systemic Diam: 1.80 cm MV Mean grad:  2.0 mmHg MV Vmax:       1.18 m/s MV Vmean:      69.7 cm/s MV Decel Time: 296 msec MV E velocity: 92.20 cm/s MV A velocity: 113.00 cm/s MV E/A ratio:  0.82 Jenkins Rouge MD Electronically signed by Jenkins Rouge MD Signature Date/Time: 11/04/2022/12:40:21 PM    Final         Scheduled Meds:  aspirin  81 mg Oral Daily   atorvastatin  40 mg Oral Daily   buprenorphine  1 patch Transdermal Weekly   calcitRIOL  1.25 mcg Oral Q M,W,F-HD   camphor-menthol  1 Application Topical Daily   carbidopa-levodopa  2 tablet Oral q AM   And   carbidopa-levodopa  1 tablet Oral BID   Chlorhexidine Gluconate Cloth  6 each Topical Q0600   darbepoetin (ARANESP) injection - DIALYSIS  200 mcg Subcutaneous Q Fri-1800   feeding supplement (NEPRO CARB STEADY)  237 mL Oral TID BM   melatonin  5 mg Oral QHS   midodrine  10 mg Oral TID WC   mirtazapine  15 mg Oral QHS   Muscle Rub   Topical TID   nystatin  1 Application Topical Daily   pantoprazole (PROTONIX) IV  40 mg Intravenous Q12H   phenytoin (DILANTIN) IV  50 mg Intravenous Q8H   rOPINIRole  12 mg Oral Daily   sodium chloride flush  3 mL Intravenous Q12H   Continuous Infusions:  anticoagulant sodium citrate       LOS: 10 days        Phillips Climes, MD [facility]   To contact the attending  provider between 7A-7P or the covering provider during after hours 7P-7A, please log into the web site [email or web address] and access using universal [place] password for that web site. If you do not have the password, please call the hospital operator.  11/04/2022, 2:24 PM

## 2022-11-04 NOTE — Consult Note (Signed)
Palliative Medicine Inpatient Consult Note  Consulting Provider: Dr. Waldron Labs  Reason for consult:   [place] Palliative Medicine Consult  Reason for Consult? GOALS OF CARE   11/05/2022  HPI:  Per intake H&P --> Jean Mcclain is a 77 y.o. female with medical history significant of hypertension, hyperlipidemia, diabetes mellitus type II, ESRD on HD(M/W/F), chronic respiratory failure with hypoxia on 3 L of oxygen at night, Parkinson's disease, history of DVT not on anticoagulation, and wheelchair-bound due to left ankle fracture who presents for seizures.   Palliative care has been asked to get involved to discuss goals in the setting of declining health state due to recurrent seizures from  a recent underlying stroke.   Clinical Assessment/Goals of Care:  *Please note that this is a verbal dictation therefore any spelling or grammatical errors are due to the "[place] [employer]" system interpretation.  I have reviewed medical records including EPIC notes, labs and imaging, received report from bedside RN, assessed the patient who is somnolent though arousable.     A family meeting was held with patients brother Jean Mcclain, [place], and niece, Jean Mcclain  to further discuss diagnosis prognosis, GOC, EOL wishes, disposition and options.   I introduced Palliative Medicine as specialized medical care for people living with serious illness. It focuses on providing relief from the symptoms and stress of a serious illness. The goal is to improve quality of life for both the patient and the family.  Discussed patients PMH of ESRD, HTN, HLD, Type II DM, PD, and DVT(s).   Jean Mcclain is originally from the [place], [place]. She lives presently at [facility]. She has an IT trainer wheelchair which she has been dependent on for a number of years now.   Patients family vocalize that Jamariyah has lost quite a bit of weight within the last 6 months. This is thought to be in  the setting of something "GI". Patients family have been hopeful for an EGD/Colo but patient to date has not been stable enough for such procedures. We discussed patients recurring nausea and that no one has been able to get to the bottom of this. The family worries that she may have a mass there which per their report would make "all decisions a lot easier".   We discussed patient prolonged admission in the setting of her anemia which has evolved into her having a stroke and recurrent seizures. We discussed the concern that she is not improving significantly. I shared my concern for continued worsening most especially in the setting of no PO intake. Discussed the idea of continuing current care versus making Satrina comfortable.   Reviewed the idea of artificial nutrition and the benefits and risks of this intervention.   Discussed the chronic disease trajectory.   Patients family is at odds. They feel that today Jean Mcclain is more responsive. They do not want to stop treatment prematurely. We discussed allowing time for outcomes, a time of three days was provided. If by Tuesday patient continues to worsen I shared we will need to meet again to determine the best next steps.   Confirmed DNAR/DNI   Discussed the importance of continued conversation with family and their  medical providers regarding overall plan of care and treatment options, ensuring decisions are within the context of the patients values and GOCs.  Decision Maker: Gold,Bernard (Brother): [phone] (Mobile)   SUMMARY OF RECOMMENDATIONS   DNAR/DNI  Open and honest conversations held in the setting of patients tenuous health  state  Allow time for outcomes --> Have determined to give patient until Tuesday and if no improvement to meet again  Family would ideally like patient to get GI WU to identify if she has a mass  Ongoing PMT support  Code Status/Advance Care Planning: DNAR/DNI  Palliative Prophylaxis:  Aspiration,  Bowel Regimen, Delirium Protocol, Frequent Pain Assessment, Oral Care, Palliative Wound Care, and Turn Reposition  Additional Recommendations (Limitations, Scope, Preferences): Continue current care  Psycho-social/Spiritual:  Desire for further Chaplaincy support: Yes Additional Recommendations: Education on progressive disease   Prognosis: Limited, hospice is a reasonable consideration if patient does not improve  Discharge Planning: Unclear  Vitals:   11/04/22 0800 11/04/22 1200  BP: (!) 101/54 (!) 112/97  Pulse: 64 74  Resp: 16 (!) 22  Temp:  97.7 F (36.5 C)  SpO2: 100% 97%    Intake/Output Summary (Last 24 hours) at 11/04/2022 1505 Last data filed at 11/04/2022 0515 Gross per 24 hour  Intake 1078.83 ml  Output --  Net 1078.83 ml   Last Weight  Most recent update: 11/04/2022  4:25 AM    Weight  68.5 kg (151 lb 0.2 oz)            Gen:  Elderly F in NAD HEENT: Dry mucous membranes CV: Regular rate and rhythm  PULM: On 4LPM [place], breathing is even and nonlabored  ABD: soft/nontender  EXT: No edema  Neuro: Alert and oriented to self and place  PPS:   This conversation/these recommendations were discussed with patient primary care team, Dr. Waldron Labs  Total Time: 69  Billing based on MDM: High  Problems Addressed: One acute or chronic illness or injury that poses a threat to life or bodily function  Amount and/or Complexity of Data: Category 3:Discussion of management or test interpretation with external physician/other qualified health care professional/appropriate source (not separately reported)  Risks: Decision regarding hospitalization or escalation of hospital care and Decision not to resuscitate or to de-escalate care because of poor prognosis ______________________________________________________ [place] Team Team Cell Phone: [phone] Please utilize secure chat with additional questions, if there is no  response within 30 minutes please call the above phone number  Palliative Medicine Team providers are available by phone from 7am to 7pm daily and can be reached through the team cell phone.  Should this patient require assistance outside of these hours, please call the patient's attending physician.

## 2022-11-04 NOTE — Progress Notes (Signed)
OT Cancellation Note  Patient Details Name: Jean Mcclain MRN: [id number] DOB: Jul 01, 1946   Cancelled Treatment:    Reason Eval/Treat Not Completed: Patient's level of consciousness (nursing states patient is difficult to arouse at this time and to possible attempt later today. Will attempt later today as schedule permits.) Lodema Hong, [place]  Office [phone]  Trixie Dredge 11/04/2022, 10:10 AM

## 2022-11-04 NOTE — Progress Notes (Signed)
   Palliative Medicine Inpatient Follow Up Note   The Palliative Care team has acknowledged the consultation for [facility].   At this time patients family has identified a meeting time of 10AM on Saturday January 27th.   A formal consultation note will follow the family meeting.   No Charge ______________________________________________________________________________________ [place] Team Team Cell Phone: [phone] Please utilize secure chat with additional questions, if there is no response within 30 minutes please call the above phone number  Palliative Medicine Team providers are available by phone from 7am to 7pm daily and can be reached through the team cell phone.  Should this patient require assistance outside of these hours, please call the patient's attending physician.

## 2022-11-04 NOTE — Procedures (Signed)
Patient Name: Jean Mcclain  MRN: [id number]  Epilepsy Attending: Lora Havens  Referring Physician/Provider: Lora Havens, MD  Duration: 11/03/2022 0915 to 11/04/2022 0915   Patient history: 77 year old female with seizure-like episodes.  EEG to evaluate for seizure.   Level of alertness: Awake, asleep   AEDs during EEG study: VPA, PHT   Technical aspects: This EEG study was done with scalp electrodes positioned according to the 10-20 International system of electrode placement. Electrical activity was reviewed with band pass filter of 1-'70Hz'$ , sensitivity of 7 uV/mm, display speed of 62m/sec with a '60Hz'$  notched filter applied as appropriate. EEG data were recorded continuously and digitally stored.  Video monitoring was available and reviewed as appropriate.   Description: The posterior dominant rhythm consists of '8Hz'$  activity of moderate voltage (25-35 uV) seen predominantly in posterior head regions, symmetric and reactive to eye opening and eye closing. Sleep was characterized by vertex waves, sleep spindles (12 to 14 Hz), maximal frontocentral region. EEG showed continuous generalized predominantly 5 to 7 Hz theta slowing admixed with 2 to 3 Hz generalized delta slowing. Hyperventilation and photic stimulation were not performed.     On 11/03/2022 at 1543 while undergoing dialysis,  patient was noted to have an event of hypotension. Concomitant EEG showed diffuse background stimulation.  One event was recorded on 11/03/2022 at 1658.  Patient was noted to have upward gaze deviation with semirhythmic twitching of bilateral upper extremity and mouth opening.  Concomitant EEG showed rhythmic myogenic artifact.  In between artifact, sharply contoured 6 to 7 Hz theta activity was noted.  IV Ativan 1 was administered at 1703 after which jerking stopped and EEG showed continuous generalized 6 to 9 Hz theta - alpha activity admixed with 2 to 3 Hz generalized delta slowing    ABNORMALITY -Continuous slow, generalized   IMPRESSION: This study is suggestive of moderate diffuse encephalopathy, nonspecific etiology.  No epileptiform discharges were seen throughout the recording.  One event was recorded on 11/03/2022 at 1658 as described above. Concomitant eeg didn't show definitive eeg change but was difficult to interpret due to significant myogenic artifact.  Semiology was concerning for myoclonic jerks. Clinical correlation is recommended.   [name] [name]

## 2022-11-04 NOTE — Progress Notes (Signed)
Subjective: Had an episode of hypotension during dialysis and therefore dialysis was interrupted/stopped earlier.  On the way back to her room, patient had seizure-like activity.  On video it appears that patient has bilateral upper extremity jerking with mouth opening which appears concerning for myoclonic seizure.  Has required units of PRBC overnight, unclear source of blood loss, suspected GI.  ROS: Unable to obtain due to poor mental status  Examination  Vital signs in last 24 hours: Temp:  [97.4 F (36.3 C)-98.7 F (37.1 C)] 97.7 F (36.5 C) (01/26 1200) Pulse Rate:  [49-74] 74 (01/26 1200) Resp:  [12-27] 22 (01/26 1200) BP: (55-150)/(32-97) 112/97 (01/26 1200) SpO2:  [85 %-100 %] 97 % (01/26 1200) Weight:  [62.9 kg-68.5 kg] 68.5 kg (01/26 0411)  General: lying in bed, NAD Neuro: Patient opens her eyes and looks at me with verbal stimulation, able to tell me her name and follow simple one-step commands, knows she is in the hospital but did not answer rest of my questions, PERRLA, EOMI, spontaneously moving all 4 extremities with antigravity strength  Basic Metabolic Panel: Recent Labs  Lab 10/29/22 0418 10/30/22 0233 10/31/22 0252 11/01/22 0705 11/02/22 0757 11/03/22 0457 11/04/22 0051  NA 133*   < > 133* 134* 136 134* 136  K 4.2   < > 3.9 4.0 3.8 4.1 4.1  CL 96*   < > 98 100 98 96* 101  CO2 26   < > '28 24 28 25 26  '$ GLUCOSE 69*   < > 95 72 78 80 102*  BUN 22   < > 19 31* 21 32* 30*  CREATININE 3.62*   < > 3.19* 4.11* 2.95* 3.62* 3.30*  CALCIUM 8.2*   < > 8.0* 8.0* 7.5* 7.5* 7.5*  MG 1.8  --   --   --   --   --   --   PHOS 3.4   < > 3.1 3.6 2.9 3.6 3.9   < > = values in this interval not displayed.    CBC: Recent Labs  Lab 10/29/22 0418 10/29/22 1029 10/30/22 0233 11/01/22 0705 11/03/22 0457 11/04/22 0051 11/04/22 0729  WBC 4.2 5.0 5.0 5.2 6.1 5.4  --   NEUTROABS 3.4  --  4.2  --   --   --   --   HGB 8.1* 8.0* 8.7* 8.1* 7.7* 7.4* 8.0*  HCT 26.1* 26.2*  27.4* 26.5* 25.2* 24.8* 25.0*  MCV 90.0 90.0 88.7 90.4 88.7 89.9  --   PLT 68* 65* 72* 78* 71* 65*  --      Coagulation Studies: No results for input(s): "LABPROT", "INR" in the last 72 hours.  Imaging No new brain imaging overnight  ASSESSMENT AND PLAN: 77 year old wheelchair-bound female with history of hypertension, hyperlipidemia, diabetes, end-stage renal disease on hemodialysis parkinsonism, RLS and myoclonus who has been having multiple seizure-like episodes.   New onset focal epilepsy Chronic stroke, left parietal Acute encephalopathy, resolved Thrombocytopenia - seizure likely due to underlying stroke.  However the episode yesterday looked more typical of myoclonic seizures which can happen in patients with ESRD -Keppra was initially started which had to be stopped due to thrombocytopenia.  Also stopped Depakote now that patient is having unclear source of bleeding and already is thrombocytopenic as Depakote can worsen thrombocytopenia.  Of note reticulocyte count did not drop after initiation of Depakote, was previously low since being on Keppra).  Could not tolerate Vimpat due to bradycardia  Recommendations -On Dilantin 50 mg every 8  hours. -Continue video EEG to monitor for seizure recurrence -If seizures persist, can consider adding Onfi -Discussed with dialysis nurse practitioner Juanell Fairly who has been with the patient for multiple years and is very familiar with her history.  Plan is to do minimize sudden blood pressure fluctuations during dialysis -Patient's brother, brother's wife and niece were at bedside.  Discussed plan in detail with them -Management of rest of comorbidities per primary team   I have spent a total of  36 minutes with the patient reviewing hospital notes,  test results, labs and examining the patient as well as establishing an assessment and plan.  > 50% of time was spent in direct patient care.   Zeb Comfort [facility] For questions after 5pm please refer to AMION to reach the Neurologist on call

## 2022-11-04 NOTE — Progress Notes (Signed)
  Echocardiogram 2D Echocardiogram has been performed.  Jean Mcclain 11/04/2022, 11:15 AM

## 2022-11-05 ENCOUNTER — Inpatient Hospital Stay: Payer: Self-pay

## 2022-11-05 DIAGNOSIS — N186 End stage renal disease: Secondary | ICD-10-CM | POA: Diagnosis not present

## 2022-11-05 DIAGNOSIS — Z7189 Other specified counseling: Secondary | ICD-10-CM | POA: Diagnosis not present

## 2022-11-05 DIAGNOSIS — R4182 Altered mental status, unspecified: Secondary | ICD-10-CM | POA: Diagnosis not present

## 2022-11-05 DIAGNOSIS — Z992 Dependence on renal dialysis: Secondary | ICD-10-CM | POA: Diagnosis not present

## 2022-11-05 DIAGNOSIS — D649 Anemia, unspecified: Secondary | ICD-10-CM | POA: Diagnosis not present

## 2022-11-05 DIAGNOSIS — Z515 Encounter for palliative care: Secondary | ICD-10-CM | POA: Diagnosis not present

## 2022-11-05 DIAGNOSIS — R112 Nausea with vomiting, unspecified: Secondary | ICD-10-CM | POA: Diagnosis not present

## 2022-11-05 DIAGNOSIS — R569 Unspecified convulsions: Secondary | ICD-10-CM | POA: Diagnosis not present

## 2022-11-05 LAB — TYPE AND SCREEN
ABO/RH(D): O POS
Antibody Screen: NEGATIVE
Unit division: 0
Unit division: 0
Unit division: 0
Unit division: 0

## 2022-11-05 LAB — BPAM RBC
Blood Product Expiration Date: 202402242359
Blood Product Expiration Date: 202402242359
Blood Product Expiration Date: 202402242359
Blood Product Expiration Date: 202402292359
ISSUE DATE / TIME: 202401251822
ISSUE DATE / TIME: 202401251952
ISSUE DATE / TIME: 202401260153
ISSUE DATE / TIME: 202401261800
Unit Type and Rh: 5100
Unit Type and Rh: 5100
Unit Type and Rh: 5100
Unit Type and Rh: 5100

## 2022-11-05 LAB — CBC
HCT: 29 % — ABNORMAL LOW (ref 36.0–46.0)
Hemoglobin: 9 g/dL — ABNORMAL LOW (ref 12.0–15.0)
MCH: 28 pg (ref 26.0–34.0)
MCHC: 31 g/dL (ref 30.0–36.0)
MCV: 90.1 fL (ref 80.0–100.0)
Platelets: 56 10*3/uL — ABNORMAL LOW (ref 150–400)
RBC: 3.22 MIL/uL — ABNORMAL LOW (ref 3.87–5.11)
RDW: 17.3 % — ABNORMAL HIGH (ref 11.5–15.5)
WBC: 4.6 10*3/uL (ref 4.0–10.5)
nRBC: 0 % (ref 0.0–0.2)

## 2022-11-05 LAB — RENAL FUNCTION PANEL
Albumin: 2.3 g/dL — ABNORMAL LOW (ref 3.5–5.0)
Anion gap: 11 (ref 5–15)
BUN: 41 mg/dL — ABNORMAL HIGH (ref 8–23)
CO2: 23 mmol/L (ref 22–32)
Calcium: 7.3 mg/dL — ABNORMAL LOW (ref 8.9–10.3)
Chloride: 101 mmol/L (ref 98–111)
Creatinine, Ser: 4.14 mg/dL — ABNORMAL HIGH (ref 0.44–1.00)
GFR, Estimated: 11 mL/min — ABNORMAL LOW (ref >60)
Glucose, Bld: 75 mg/dL (ref 70–99)
Phosphorus: 4.5 mg/dL (ref 2.5–4.6)
Potassium: 4 mmol/L (ref 3.5–5.1)
Sodium: 135 mmol/L (ref 135–145)

## 2022-11-05 LAB — GLUCOSE, CAPILLARY
Glucose-Capillary: 72 mg/dL (ref 70–99)
Glucose-Capillary: 71 mg/dL (ref 70–99)
Glucose-Capillary: 48 mg/dL — ABNORMAL LOW (ref 70–99)
Glucose-Capillary: 59 mg/dL — ABNORMAL LOW (ref 70–99)
Glucose-Capillary: 66 mg/dL — ABNORMAL LOW (ref 70–99)
Glucose-Capillary: 88 mg/dL (ref 70–99)
Glucose-Capillary: 96 mg/dL (ref 70–99)

## 2022-11-05 MED ORDER — GLUCAGON HCL RDNA (DIAGNOSTIC) 1 MG IJ SOLR
1.0000 mg | Freq: Once | INTRAMUSCULAR | Status: AC | PRN
  Administered 2022-11-05: 1 mg via INTRAMUSCULAR
  Filled 2022-11-05: qty 1

## 2022-11-05 MED ORDER — SODIUM CHLORIDE 0.9% FLUSH: 10.0000 mL | INTRAVENOUS | Status: DC | PRN

## 2022-11-05 NOTE — Progress Notes (Addendum)
[facility] Progress Note   Subjective: No events noted overnight. HD today off schedule.    Objective Vitals:   11/04/22 2114 11/05/22 0000 11/05/22 0400 11/05/22 0837  BP: (!) 109/51 119/72 111/80 (!) 108/23  Pulse: 64 65 66   Resp: (!) 21 (!) 21 12   Temp: (!) 97.3 F (36.3 C) 97.9 F (36.6 C) (!) 97.5 F (36.4 C) 98.1 F (36.7 C)  TempSrc: Axillary Axillary Axillary Axillary  SpO2:  95% 94%   Weight:      Height:       Physical Exam General: Chronically ill appearing female in NAD Heart: S1,S2 No M/R/G Lungs: CTAB, decreased in bases.  Abdomen: Soft NABS Extremities: No LE edema Dialysis Access: R AVF + T/B  Additional Objective Labs: Basic Metabolic Panel: Recent Labs  Lab 11/03/22 0457 11/04/22 0051 11/05/22 0304  NA 134* 136 135  K 4.1 4.1 4.0  CL 96* 101 101  CO2 '25 26 23  '$ GLUCOSE 80 102* 75  BUN 32* 30* 41*  CREATININE 3.62* 3.30* 4.14*  CALCIUM 7.5* 7.5* 7.3*  PHOS 3.6 3.9 4.5   Liver Function Tests: Recent Labs  Lab 11/03/22 0457 11/04/22 0051 11/05/22 0304  ALBUMIN 2.4* 2.3* 2.3*   No results for input(s): "LIPASE", "AMYLASE" in the last 168 hours. CBC: Recent Labs  Lab 10/30/22 0233 11/01/22 0705 11/03/22 0457 11/04/22 0051 11/04/22 0729 11/04/22 1428 11/05/22 0304  WBC 5.0 5.2 6.1 5.4  --   --  4.6  NEUTROABS 4.2  --   --   --   --   --   --   HGB 8.7* 8.1* 7.7* 7.4* 8.0* 7.5* 9.0*  HCT 27.4* 26.5* 25.2* 24.8* 25.0* 23.3* 29.0*  MCV 88.7 90.4 88.7 89.9  --   --  90.1  PLT 72* 78* 71* 65*  --   --  56*   Blood Culture    Component Value Date/Time   SDES BLOOD LEFT FOREARM 09/29/2022 1716   SPECREQUEST  09/29/2022 1716    BOTTLES DRAWN AEROBIC AND ANAEROBIC Blood Culture adequate volume   CULT  09/29/2022 1716    NO GROWTH 5 DAYS Performed at [facility], [place] [address] [place], [place] [address]    REPTSTATUS 10/04/2022 FINAL 09/29/2022 1716    Cardiac Enzymes: No results for input(s):  "CKTOTAL", "CKMB", "CKMBINDEX", "TROPONINI" in the last 168 hours. CBG: Recent Labs  Lab 11/04/22 1144 11/04/22 1555 11/04/22 2125 11/05/22 0513 11/05/22 0844  GLUCAP 72 79 74 72 71   Iron Studies: No results for input(s): "IRON", "TIBC", "TRANSFERRIN", "FERRITIN" in the last 72 hours. '@lablastinr3'$ @ Studies/Results: CT ABDOMEN PELVIS WO CONTRAST  Result Date: 11/04/2022 CLINICAL DATA:  Anemia EXAM: CT ABDOMEN AND PELVIS WITHOUT CONTRAST TECHNIQUE: Multidetector CT imaging of the abdomen and pelvis was performed following the standard protocol without IV contrast. RADIATION DOSE REDUCTION: This exam was performed according to the departmental dose-optimization program which includes automated exposure control, adjustment of the mA and/or kV according to patient size and/or use of iterative reconstruction technique. COMPARISON:  10/20/2022 FINDINGS: Lower chest: Bilateral pleural effusions are noted slightly larger on the right than the left. These are increased from the prior exam. Lower lobe consolidation is noted also right greater than left. Hepatobiliary: No focal liver abnormality is seen. No gallstones, gallbladder wall thickening, or biliary dilatation. Pancreas: Unremarkable. No pancreatic ductal dilatation or surrounding inflammatory changes. Spleen: Normal in size without focal abnormality. Adrenals/Urinary Tract: Adrenal glands are stable from  the prior exam. Again a small 11 mm nodule is noted within the left adrenal gland consistent with small adenoma unchanged from 20/10. Kidneys are well visualized bilaterally with diffuse cortical thinning. Cystic changes are noted similar to that seen on the prior exam. No obstructive changes are seen. No renal calculi are noted. The bladder is partially distended. Stomach/Bowel: There is again noted wall thickening in the sigmoid colon related to diverticular change. The degree of inflammatory change has improved in the interval from the prior study.  No abscess is identified. More proximal colon is within normal limits with the exception of diverticular change. Stomach is within normal limits. No obstructive changes in the small bowel are noted. Vascular/Lymphatic: Aortic atherosclerosis. No enlarged abdominal or pelvic lymph nodes. Reproductive: Uterus is within normal limits. Calcifications in the right ovary are again noted and stable. A simple appearing left ovarian cyst is noted measuring 3.6 cm. This is stable from the prior exam Other: No abdominal wall hernia or abnormality. No abdominopelvic ascites. Musculoskeletal: No acute or significant osseous findings. Old rib fractures are noted on the left and stable. IMPRESSION: Persistent changes of previous diverticulitis with persistent wall thickening. The degree of inflammatory changes have improved in the interval from the prior exam. 3.6 cm left simple appearing ovarian cyst. Recommend follow-up US in 6-12 months. Note: This recommendation does not apply to premenarchal patients and to those with increased risk (genetic, family history, elevated tumor markers or other high-risk factors) of ovarian cancer. Reference: JACR 2020 Feb; 17(2):248-254 Stable left adrenal adenoma. No follow-up is recommended as it is been stable for over 13 years. Small effusions and lower lobe consolidation increased in the interval from the prior exam. Exophytic cyst in the right kidney similar to that seen on the prior exam. Again nonemergent outpatient MRI is recommended for further evaluation. Electronically Signed   By: Inez Catalina M.D.   On: 11/04/2022 17:14   ECHOCARDIOGRAM COMPLETE  Result Date: 11/04/2022    ECHOCARDIOGRAM REPORT   Patient Name:   JAZZALYNN RHUDY Date of Exam: 11/04/2022 Medical Rec #:  [id number]        Height:       60.0 in Accession #:    [id number]       Weight:       151.0 lb Date of Birth:  1946/08/31         BSA:          1.657 m Patient Age:    77 years         BP:           101/54 mmHg  Patient Gender: F                HR:           70 bpm. Exam Location:  Inpatient Procedure: 2D Echo Indications:    syncope  History:        Patient has prior history of Echocardiogram examinations, most                 recent 07/22/2022. End stage renal disease. Parkinson's                 disease.; Risk Factors:Sleep Apnea and Diabetes.  Sonographer:    Johny Chess RDCS Referring Phys: [id number] [place]  1. Left ventricular ejection fraction, by estimation, is 60 to 65%. The left ventricle has normal function. The left ventricle has no regional wall motion abnormalities. There is  mild left ventricular hypertrophy. Left ventricular diastolic parameters are consistent with Grade I diastolic dysfunction (impaired relaxation).  2. Right ventricular systolic function is normal. The right ventricular size is normal.  3. Left atrial size was mildly dilated.  4. The mitral valve is abnormal. Trivial mitral valve regurgitation. No evidence of mitral stenosis. Severe mitral annular calcification.  5. The aortic valve is tricuspid. There is moderate calcification of the aortic valve. There is moderate thickening of the aortic valve. Aortic valve regurgitation is not visualized. Aortic valve sclerosis/calcification is present, without any evidence of aortic stenosis.  6. The inferior vena cava is normal in size with greater than 50% respiratory variability, suggesting right atrial pressure of 3 mmHg. FINDINGS  Left Ventricle: Left ventricular ejection fraction, by estimation, is 60 to 65%. The left ventricle has normal function. The left ventricle has no regional wall motion abnormalities. The left ventricular internal cavity size was normal in size. There is  mild left ventricular hypertrophy. Left ventricular diastolic parameters are consistent with Grade I diastolic dysfunction (impaired relaxation). Right Ventricle: The right ventricular size is normal. No increase in right ventricular wall  thickness. Right ventricular systolic function is normal. Left Atrium: Left atrial size was mildly dilated. Right Atrium: Right atrial size was normal in size. Pericardium: There is no evidence of pericardial effusion. Mitral Valve: The mitral valve is abnormal. There is mild thickening of the mitral valve leaflet(s). There is mild calcification of the mitral valve leaflet(s). Severe mitral annular calcification. Trivial mitral valve regurgitation. No evidence of mitral valve stenosis. MV peak gradient, 5.6 mmHg. The mean mitral valve gradient is 2.0 mmHg. Tricuspid Valve: The tricuspid valve is normal in structure. Tricuspid valve regurgitation is mild . No evidence of tricuspid stenosis. Aortic Valve: The aortic valve is tricuspid. There is moderate calcification of the aortic valve. There is moderate thickening of the aortic valve. Aortic valve regurgitation is not visualized. Aortic valve sclerosis/calcification is present, without any  evidence of aortic stenosis. Pulmonic Valve: The pulmonic valve was normal in structure. Pulmonic valve regurgitation is trivial. No evidence of pulmonic stenosis. Aorta: The aortic root is normal in size and structure. Venous: The inferior vena cava is normal in size with greater than 50% respiratory variability, suggesting right atrial pressure of 3 mmHg. IAS/Shunts: No atrial level shunt detected by color flow Doppler.  LEFT VENTRICLE PLAX 2D LVIDd:         3.90 cm   Diastology LVIDs:         2.20 cm   LV e' medial:    7.30 cm/s LV PW:         1.00 cm   LV E/e' medial:  12.6 LV IVS:        1.20 cm   LV e' lateral:   9.17 cm/s LVOT diam:     1.80 cm   LV E/e' lateral: 10.1 LV SV:         50 LV SV Index:   30 LVOT Area:     2.54 cm  RIGHT VENTRICLE             IVC RV Basal diam:  2.60 cm     IVC diam: 1.10 cm RV S prime:     10.40 cm/s TAPSE (M-mode): 2.2 cm LEFT ATRIUM             Index        RIGHT ATRIUM           Index LA diam:  3.80 cm 2.29 cm/m   RA Area:     12.80  cm LA Vol (A2C):   68.2 ml 41.17 ml/m  RA Volume:   28.60 ml  17.27 ml/m LA Vol (A4C):   60.2 ml 36.34 ml/m LA Biplane Vol: 66.6 ml 40.20 ml/m  AORTIC VALVE LVOT Vmax:   89.60 cm/s LVOT Vmean:  58.400 cm/s LVOT VTI:    0.198 m  AORTA Ao Root diam: 2.90 cm Ao Asc diam:  3.20 cm MITRAL VALVE MV Area (PHT): 2.56 cm     SHUNTS MV Area VTI:   1.48 cm     Systemic VTI:  0.20 m MV Peak grad:  5.6 mmHg     Systemic Diam: 1.80 cm MV Mean grad:  2.0 mmHg MV Vmax:       1.18 m/s MV Vmean:      69.7 cm/s MV Decel Time: 296 msec MV E velocity: 92.20 cm/s MV A velocity: 113.00 cm/s MV E/A ratio:  0.82 Jenkins Rouge MD Electronically signed by Jenkins Rouge MD Signature Date/Time: 11/04/2022/12:40:21 PM    Final    Medications:  anticoagulant sodium citrate      atorvastatin  40 mg Oral Daily   buprenorphine  1 patch Transdermal Weekly   calcitRIOL  1.25 mcg Oral Q M,W,F-HD   camphor-menthol  1 Application Topical Daily   carbidopa-levodopa  2 tablet Oral q AM   And   carbidopa-levodopa  1 tablet Oral BID   Chlorhexidine Gluconate Cloth  6 each Topical Q0600   darbepoetin (ARANESP) injection - DIALYSIS  200 mcg Subcutaneous Q Fri-1800   feeding supplement (NEPRO CARB STEADY)  237 mL Oral TID BM   melatonin  5 mg Oral QHS   midodrine  10 mg Oral TID WC   mirtazapine  15 mg Oral QHS   Muscle Rub   Topical TID   nystatin  1 Application Topical Daily   pantoprazole (PROTONIX) IV  40 mg Intravenous Q12H   phenytoin (DILANTIN) IV  50 mg Intravenous Q8H   rOPINIRole  12 mg Oral Daily   sodium chloride flush  3 mL Intravenous Q12H     Dialysis Orders: [facility] MWF 3:15h  67.9 kg 3K/2Ca  AVF  Hep 3000 units IV TIW Mircera 231mg IV q 2 weeks - last given 10/09/22 Venofer 50 mg IV qwk   Assessment/Plan: Myoclonus/ jerking: initially thought possibly gabapentin toxicity- CT head negative.  Electrolytes OK.  Stopped gabapentin. Now wondering if all related to #2 MRI  - no acute intracranial findings, incomplete  image mass/lesion in left neck. EEG w/mild-mod encephalopathy, non specific etiology. No seizure activity noted.  Seizure:  First seizure 1/16, endo cancelled, loaded with Keppra, neuro following.  Another seizure on dialysis 1/17.  Stopped with 2 mg IV ativan.  EEG negative. Per neuro seizure likely d/t underlying stroke. Had more breakthrough seizures this week but did not tolerate. Neurology following, plans to load with dilantin.   Lack of IV access: Discussed with Dr. SJonnie Finnerand Dr. EBonney Aid Will proceed with PICC despite being ESRD pt.  Diverticulitis of L colon - seen on CT.  For EGD and colonoscopy once stable.  Per GI ESRD: MWF [facility]. HD off schedule this week. Next HD 11/05/2022.  Hypotension: Syncopal epidisode/symptomatic hypotension post HD 11/03/2022. Improved after blood transfusion. Avoid hypotension. Run even 11/05/2022. HGB falling hold heparin with HD.  Anemia of ESRD: HGB 8 range. Now S/P 3 units PRBCS 01/25-01/26/2024. 4 units PRBC since admission. Follow HGB  continue  Aranesp 366KDP qFri Metabolic Bone Disease - CCa, phos, Mg in goal.  Continue calcitriol. Nutrition - NPO at present.  Nausea - on antiemetics.  GOC-patient is a DNR. No children. Family at bedside. Will order Palliative Care consult for goals of care.  Rita H. Brown NP-C 11/05/2022, 12:03 PM  [facility] [phone]  Pt seen, examined and agree w A/P as above. Patient is very debilitated and has not been tolerating dialysis well all of this week. If she continues to decline will need to consider stopping dialysis and focusing on comfort.  Kelly Splinter  MD 11/05/2022, 4:36 PM

## 2022-11-05 NOTE — Progress Notes (Signed)
MB performed maintenance on electrodes. All impedances are below 10k ohms except C3. Pt is currently sleeping and did not disturb to awake. Pt will be going back down to Dialysis again later today per Nurse. No skin breakdown noted.

## 2022-11-05 NOTE — Progress Notes (Signed)
PROGRESS NOTE    Jean Mcclain  BTD:[id number] DOB: 16-May-1946 DOA: 10/24/2022 PCP: Pcp, No   Chief Complaint  Patient presents with   Shaking    Brief Narrative:   Overnight: NAEON, Pt with multiple seizures yesterday and 1 episode where pt thought to have lost pulse, appears to be 2/2 to vasovagal 2/2 seizure like activity.  -Patient with prolonged hospital stay, she has been seen by neurology for her new onset seizures, which was felt  provoked by diagnosis of CVA ((not acute) happened over the last few month, as well she had couple of near syncopal events, with low blood pressure, she is having persistent nausea/vomiting over the last few months with failure to thrive and protein Cardama nutrition, has been seen by GI, with recommendation for endoscopy, but she has not been stable for that procedure, On 10/31/2022, she had brief episode of unresponsiveness, CODE BLUE was called, but she never lost pulse, she had brief CPR performed.  Assessment & Plan:   Principal Problem:   Symptomatic anemia Active Problems:   Jerking   Acute diverticulitis   Acute on chronic respiratory failure with hypoxia (HCC)   Nausea and vomiting   ESRD on hemodialysis (HCC)   Diabetes mellitus due to underlying condition, controlled, with stage 2 chronic kidney disease, with long-term current use of insulin (HCC)   Thrombocytopenia (HCC)   History of DVT (deep vein thrombosis)   Parkinson's disease   Abnormal TSH   Kidney lesion   ESRD on dialysis (HCC)   Abnormal CT scan, gastrointestinal tract   Abnormal movements   Anemia   Seizure disorder (HCC)      Symptomatic acute on chronic blood loss anemia.   Anemia of chronic kidney disease Nausea and vomiting - Possible occult GI blood loss with recent history of diverticulitis and pending outpatient colonoscopy.   -GI has been consulted, recommendation has been for endoscopy, endoscopy has been consulted twice given patient instability, for  which she had seizures, and hypertension, she remains unstable for procedure . -Required multiple units PRBC transfusion. -CT abdomen pelvis has been repeated yesterday without evidence of GI bleed. -Aranesp and IV iron per renal -She is with significant bruising in the left upper chest, shoulder and breast area, this would contribute to some significant blood loss as well  New onset seizures -Management per neurology -Management per neurology, she remains on LTM EEG -Seizures most likely provoked by CVA. -AED management by neurology -He had another seizure today which required IV Ativan.  ESRD on HD.  -for HD today   Incidental Neck Mass  Noted on MRI. CT soft tissue neck shows mass that is most likely benign and most consistent with dermal inclusion cyst.  -PCP to monitor outpatient   Acute on chronic hypoxic respiratory failure with underlying OSA.  Resolved. At home on 3 L and here on 2-3 L of oxygen.  Likely increased initially due to low hgb.    History of DVT in 2018.  Not on anticoagulation anymore, repeat lower extremity venous ultrasound ordered during admission remains negative.   History of RLS .  Continue home regimen.   History of sick euthyroid syndrome.  Stable TSH now.   persistent thrombocytopenia. -   Continue to monitor. Not on heparin. DIC panel negative and smear showed no schistocytes. On keppra which can cause thrombocytopenia.  Platelet count keep trending down, it is at 458 K today, no indication for transfusion  Severe PCM FTT Chronic debility -With albumin of  2.3, he is with very poor oral intake over last few months secondary to persistent nausea and vomiting  Kidney lesion - Noted to be increased in size on CT scan from 07/2022 for which MRI of the abdomen recommended in 6 months at that time, PCP to schedule it for April 2024.   DM type II.  A1c is 6.9 in 07/2022 . Sliding scale discontinued given hypoglycemia yesterday. CBG within range.   Lack  of IV access - multiple attempt IV team were unsuccessful, cussed with nephrology, PICC line is important for her IV antiseizure medications, so risks outweighed benefits, seated with PICC line.  Goals of care discussion: Discussed with brother, sister-in-law and niece, I have discussed with them her overall prognosis is guarded, she has been having persistent nausea and vomiting over the last few months, with very poor oral intake, and very poor ambulatory at baseline where she is electric wheelchair dependent, with  ESRD, with very poor oral intake secondary to nausea, vomiting, and encephalopathy in the setting of recent diagnosis of CVA, and her seizure status, as well currently with significant anemia requiring multiple transfusions , palliative medicine has been following closely.   DVT prophylaxis:SCD Code Status: DNR Family Communication: Discussed with brother and niece at bedside today Disposition:   Status is: Inpatient    Consultants:  GI Renal Neurology Palliative   Subjective:  No significant events as discussed with staff overnight Objective: Vitals:   11/05/22 0400 11/05/22 0837 11/05/22 1400 11/05/22 1430  BP: 111/80 (!) 108/23 (!) 116/36 (!) 119/38  Pulse: 66  63 65  Resp: '12  12 18  '$ Temp: (!) 97.5 F (36.4 C) 98.1 F (36.7 C) 98.1 F (36.7 C)   TempSrc: Axillary Axillary    SpO2: 94%  99% 100%  Weight:      Height:        Intake/Output Summary (Last 24 hours) at 11/05/2022 1453 Last data filed at 11/04/2022 2114 Gross per 24 hour  Intake 400 ml  Output --  Net 400 ml   Filed Weights   11/03/22 1430 11/03/22 1600 11/04/22 0411  Weight: 63 kg 62.9 kg 68.5 kg    Examination:  Is more awake and appropriate today, but she remains confused, severely deconditioned, ill-appearing, extremely frail.   Symmetrical Chest wall movement, Good air movement bilaterally, CTAB RRR,No Gallops,Rubs or new Murmurs, No Parasternal Heave +ve B.Sounds, Abd Soft, No  tenderness, No rebound - guarding or rigidity. No Cyanosis, Clubbing or edema,  Significant bruising in the left upper chest, breast and shoulder area      Data Reviewed: I have personally reviewed following labs and imaging studies  CBC: Recent Labs  Lab 10/30/22 0233 11/01/22 0705 11/03/22 0457 11/04/22 0051 11/04/22 0729 11/04/22 1428 11/05/22 0304  WBC 5.0 5.2 6.1 5.4  --   --  4.6  NEUTROABS 4.2  --   --   --   --   --   --   HGB 8.7* 8.1* 7.7* 7.4* 8.0* 7.5* 9.0*  HCT 27.4* 26.5* 25.2* 24.8* 25.0* 23.3* 29.0*  MCV 88.7 90.4 88.7 89.9  --   --  90.1  PLT 72* 78* 71* 65*  --   --  56*    Basic Metabolic Panel: Recent Labs  Lab 11/01/22 0705 11/02/22 0757 11/03/22 0457 11/04/22 0051 11/05/22 0304  NA 134* 136 134* 136 135  K 4.0 3.8 4.1 4.1 4.0  CL 100 98 96* 101 101  CO2 '24 28 25 '$ 26  23  GLUCOSE 72 78 80 102* 75  BUN 31* 21 32* 30* 41*  CREATININE 4.11* 2.95* 3.62* 3.30* 4.14*  CALCIUM 8.0* 7.5* 7.5* 7.5* 7.3*  PHOS 3.6 2.9 3.6 3.9 4.5    GFR: Estimated Creatinine Clearance: 10 mL/min (A) (by C-G formula based on SCr of 4.14 mg/dL (H)).  Liver Function Tests: Recent Labs  Lab 11/01/22 0705 11/02/22 0757 11/03/22 0457 11/04/22 0051 11/05/22 0304  ALBUMIN 2.5* 2.4* 2.4* 2.3* 2.3*    CBG: Recent Labs  Lab 11/04/22 1144 11/04/22 1555 11/04/22 2125 11/05/22 0513 11/05/22 0844  GLUCAP 72 79 74 72 71     No results found for this or any previous visit (from the past 240 hour(s)).       Radiology Studies: [place] EKG SITE RITE  Result Date: 11/05/2022 If Site Rite image not attached, placement could not be confirmed due to current cardiac rhythm.  CT ABDOMEN PELVIS WO CONTRAST  Result Date: 11/04/2022 CLINICAL DATA:  Anemia EXAM: CT ABDOMEN AND PELVIS WITHOUT CONTRAST TECHNIQUE: Multidetector CT imaging of the abdomen and pelvis was performed following the standard protocol without IV contrast. RADIATION DOSE REDUCTION: This exam was  performed according to the departmental dose-optimization program which includes automated exposure control, adjustment of the mA and/or kV according to patient size and/or use of iterative reconstruction technique. COMPARISON:  10/20/2022 FINDINGS: Lower chest: Bilateral pleural effusions are noted slightly larger on the right than the left. These are increased from the prior exam. Lower lobe consolidation is noted also right greater than left. Hepatobiliary: No focal liver abnormality is seen. No gallstones, gallbladder wall thickening, or biliary dilatation. Pancreas: Unremarkable. No pancreatic ductal dilatation or surrounding inflammatory changes. Spleen: Normal in size without focal abnormality. Adrenals/Urinary Tract: Adrenal glands are stable from the prior exam. Again a small 11 mm nodule is noted within the left adrenal gland consistent with small adenoma unchanged from 20/10. Kidneys are well visualized bilaterally with diffuse cortical thinning. Cystic changes are noted similar to that seen on the prior exam. No obstructive changes are seen. No renal calculi are noted. The bladder is partially distended. Stomach/Bowel: There is again noted wall thickening in the sigmoid colon related to diverticular change. The degree of inflammatory change has improved in the interval from the prior study. No abscess is identified. More proximal colon is within normal limits with the exception of diverticular change. Stomach is within normal limits. No obstructive changes in the small bowel are noted. Vascular/Lymphatic: Aortic atherosclerosis. No enlarged abdominal or pelvic lymph nodes. Reproductive: Uterus is within normal limits. Calcifications in the right ovary are again noted and stable. A simple appearing left ovarian cyst is noted measuring 3.6 cm. This is stable from the prior exam Other: No abdominal wall hernia or abnormality. No abdominopelvic ascites. Musculoskeletal: No acute or significant osseous  findings. Old rib fractures are noted on the left and stable. IMPRESSION: Persistent changes of previous diverticulitis with persistent wall thickening. The degree of inflammatory changes have improved in the interval from the prior exam. 3.6 cm left simple appearing ovarian cyst. Recommend follow-up US in 6-12 months. Note: This recommendation does not apply to premenarchal patients and to those with increased risk (genetic, family history, elevated tumor markers or other high-risk factors) of ovarian cancer. Reference: JACR 2020 Feb; 17(2):248-254 Stable left adrenal adenoma. No follow-up is recommended as it is been stable for over 13 years. Small effusions and lower lobe consolidation increased in the interval from the prior exam. Exophytic cyst in  the right kidney similar to that seen on the prior exam. Again nonemergent outpatient MRI is recommended for further evaluation. Electronically Signed   By: Inez Catalina M.D.   On: 11/04/2022 17:14   ECHOCARDIOGRAM COMPLETE  Result Date: 11/04/2022    ECHOCARDIOGRAM REPORT   Patient Name:   Jean Mcclain Date of Exam: 11/04/2022 Medical Rec #:  [id number]        Height:       60.0 in Accession #:    [id number]       Weight:       151.0 lb Date of Birth:  11-Jan-1946         BSA:          1.657 m Patient Age:    30 years         BP:           101/54 mmHg Patient Gender: F                HR:           70 bpm. Exam Location:  Inpatient Procedure: 2D Echo Indications:    syncope  History:        Patient has prior history of Echocardiogram examinations, most                 recent 07/22/2022. End stage renal disease. Parkinson's                 disease.; Risk Factors:Sleep Apnea and Diabetes.  Sonographer:    Johny Chess RDCS Referring Phys: 40 [place]  1. Left ventricular ejection fraction, by estimation, is 60 to 65%. The left ventricle has normal function. The left ventricle has no regional wall motion abnormalities. There is mild left  ventricular hypertrophy. Left ventricular diastolic parameters are consistent with Grade I diastolic dysfunction (impaired relaxation).  2. Right ventricular systolic function is normal. The right ventricular size is normal.  3. Left atrial size was mildly dilated.  4. The mitral valve is abnormal. Trivial mitral valve regurgitation. No evidence of mitral stenosis. Severe mitral annular calcification.  5. The aortic valve is tricuspid. There is moderate calcification of the aortic valve. There is moderate thickening of the aortic valve. Aortic valve regurgitation is not visualized. Aortic valve sclerosis/calcification is present, without any evidence of aortic stenosis.  6. The inferior vena cava is normal in size with greater than 50% respiratory variability, suggesting right atrial pressure of 3 mmHg. FINDINGS  Left Ventricle: Left ventricular ejection fraction, by estimation, is 60 to 65%. The left ventricle has normal function. The left ventricle has no regional wall motion abnormalities. The left ventricular internal cavity size was normal in size. There is  mild left ventricular hypertrophy. Left ventricular diastolic parameters are consistent with Grade I diastolic dysfunction (impaired relaxation). Right Ventricle: The right ventricular size is normal. No increase in right ventricular wall thickness. Right ventricular systolic function is normal. Left Atrium: Left atrial size was mildly dilated. Right Atrium: Right atrial size was normal in size. Pericardium: There is no evidence of pericardial effusion. Mitral Valve: The mitral valve is abnormal. There is mild thickening of the mitral valve leaflet(s). There is mild calcification of the mitral valve leaflet(s). Severe mitral annular calcification. Trivial mitral valve regurgitation. No evidence of mitral valve stenosis. MV peak gradient, 5.6 mmHg. The mean mitral valve gradient is 2.0 mmHg. Tricuspid Valve: The tricuspid valve is normal in structure.  Tricuspid valve regurgitation is mild .  No evidence of tricuspid stenosis. Aortic Valve: The aortic valve is tricuspid. There is moderate calcification of the aortic valve. There is moderate thickening of the aortic valve. Aortic valve regurgitation is not visualized. Aortic valve sclerosis/calcification is present, without any  evidence of aortic stenosis. Pulmonic Valve: The pulmonic valve was normal in structure. Pulmonic valve regurgitation is trivial. No evidence of pulmonic stenosis. Aorta: The aortic root is normal in size and structure. Venous: The inferior vena cava is normal in size with greater than 50% respiratory variability, suggesting right atrial pressure of 3 mmHg. IAS/Shunts: No atrial level shunt detected by color flow Doppler.  LEFT VENTRICLE PLAX 2D LVIDd:         3.90 cm   Diastology LVIDs:         2.20 cm   LV e' medial:    7.30 cm/s LV PW:         1.00 cm   LV E/e' medial:  12.6 LV IVS:        1.20 cm   LV e' lateral:   9.17 cm/s LVOT diam:     1.80 cm   LV E/e' lateral: 10.1 LV SV:         50 LV SV Index:   30 LVOT Area:     2.54 cm  RIGHT VENTRICLE             IVC RV Basal diam:  2.60 cm     IVC diam: 1.10 cm RV S prime:     10.40 cm/s TAPSE (M-mode): 2.2 cm LEFT ATRIUM             Index        RIGHT ATRIUM           Index LA diam:        3.80 cm 2.29 cm/m   RA Area:     12.80 cm LA Vol (A2C):   68.2 ml 41.17 ml/m  RA Volume:   28.60 ml  17.27 ml/m LA Vol (A4C):   60.2 ml 36.34 ml/m LA Biplane Vol: 66.6 ml 40.20 ml/m  AORTIC VALVE LVOT Vmax:   89.60 cm/s LVOT Vmean:  58.400 cm/s LVOT VTI:    0.198 m  AORTA Ao Root diam: 2.90 cm Ao Asc diam:  3.20 cm MITRAL VALVE MV Area (PHT): 2.56 cm     SHUNTS MV Area VTI:   1.48 cm     Systemic VTI:  0.20 m MV Peak grad:  5.6 mmHg     Systemic Diam: 1.80 cm MV Mean grad:  2.0 mmHg MV Vmax:       1.18 m/s MV Vmean:      69.7 cm/s MV Decel Time: 296 msec MV E velocity: 92.20 cm/s MV A velocity: 113.00 cm/s MV E/A ratio:  0.82 Jenkins Rouge MD  Electronically signed by Jenkins Rouge MD Signature Date/Time: 11/04/2022/12:40:21 PM    Final         Scheduled Meds:  atorvastatin  40 mg Oral Daily   buprenorphine  1 patch Transdermal Weekly   calcitRIOL  1.25 mcg Oral Q M,W,F-HD   camphor-menthol  1 Application Topical Daily   carbidopa-levodopa  2 tablet Oral q AM   And   carbidopa-levodopa  1 tablet Oral BID   Chlorhexidine Gluconate Cloth  6 each Topical Q0600   darbepoetin (ARANESP) injection - DIALYSIS  200 mcg Subcutaneous Q Fri-1800   feeding supplement (NEPRO CARB STEADY)  237 mL Oral TID BM  melatonin  5 mg Oral QHS   midodrine  10 mg Oral TID WC   mirtazapine  15 mg Oral QHS   Muscle Rub   Topical TID   nystatin  1 Application Topical Daily   pantoprazole (PROTONIX) IV  40 mg Intravenous Q12H   phenytoin (DILANTIN) IV  50 mg Intravenous Q8H   rOPINIRole  12 mg Oral Daily   sodium chloride flush  3 mL Intravenous Q12H   Continuous Infusions:  anticoagulant sodium citrate       LOS: 11 days        Phillips Climes, MD [facility]   To contact the attending provider between 7A-7P or the covering provider during after hours 7P-7A, please log into the web site [email or web address] and access using universal [facility] password for that web site. If you do not have the password, please call the hospital operator.  11/05/2022, 2:53 PM

## 2022-11-05 NOTE — Progress Notes (Signed)
Patient not available for PICC placement at this time per Samuella Cota. Transferred to dialysis for her treatment. PICC will be placed tomorrow due to not having a PICC nurse after patient returns to her room.

## 2022-11-05 NOTE — Plan of Care (Signed)
  Problem: Metabolic: Goal: Ability to maintain appropriate glucose levels will improve Outcome: Progressing   Problem: Nutritional: Goal: Maintenance of adequate nutrition will improve Outcome: Progressing   Problem: Skin Integrity: Goal: Risk for impaired skin integrity will decrease Outcome: Progressing   Problem: Activity: Goal: Risk for activity intolerance will decrease Outcome: Progressing

## 2022-11-05 NOTE — Progress Notes (Signed)
There is consult for placement PIV access. Rt. Arm is restricted due to AV fistula. Present PIV access is not working and patient c/o pain when flush. Noticed ecchymosis on Lt. Upper inner arm and Lt. Lower breast. Patient family member stated that on the Lt. Side of back as well as ecchymosis. PIV access is not the best choice at this time. Informed patient's RN and Nephrology NP discussed with Dr. Waldron Labs regarding this matter. Patient will get central line for IV medications. HS [employer]

## 2022-11-05 NOTE — Progress Notes (Signed)
MB Transferred Pt from Dialysis 6E02 back to her room in 5W25.

## 2022-11-05 NOTE — Progress Notes (Signed)
Subjective: Resting comfortably in bed.   Objective: Current vital signs: BP (!) 108/23 (BP Location: Right Leg)   Pulse 66   Temp 98.1 F (36.7 C) (Axillary)   Resp 12   Ht 5' (1.524 m)   Wt 68.5 kg   SpO2 94%   BMI 29.49 kg/m  Vital signs in last 24 hours: Temp:  [97.3 F (36.3 C)-98.4 F (36.9 C)] 98.1 F (36.7 C) (01/27 0837) Pulse Rate:  [64-74] 66 (01/27 0400) Resp:  [12-22] 12 (01/27 0400) BP: (103-119)/(23-97) 108/23 (01/27 0837) SpO2:  [94 %-99 %] 94 % (01/27 0400)  Intake/Output from previous day: 01/26 0701 - 01/27 0700 In: 400 [Blood:400] Out: -  Intake/Output this shift: No intake/output data recorded. Nutritional status:  Diet Order             Diet NPO time specified  Diet effective midnight                  HEENT: EEG leads are in place. Decreased hydration of oral mucosa Lungs: Respirations unlabored Ext: Warm and well perfused  Neurologic Exam: Ment: Awake with decreased level of alertness. Oriented to the month, but not the day of the week or the year. Oriented to the city and the state but cannot remember the name of the hospital. Speech is sparse but fluent. Comprehension is intact for basic questions and commands. Affect dysthymic.  CN: PERRL. EOMI. Face symmetric. Phonation intact.  Motor: 4/5 BUE. 4/5 BLE Sensory: Intact to gross touch x 4 Reflexes: Unable to elicit patellars due to prior knee operations Cerebellar: No ataxia noted.  Gait: Deferred  Lab Results: Results for orders placed or performed during the hospital encounter of 10/24/22 (from the past 48 hour(s))  Glucose, capillary     Status: None   Collection Time: 11/03/22 12:10 PM  Result Value Ref Range   Glucose-Capillary 81 70 - 99 mg/dL    Comment: Glucose reference range applies only to samples taken after fasting for at least 8 hours.  Glucose, capillary     Status: None   Collection Time: 11/03/22  3:45 PM  Result Value Ref Range   Glucose-Capillary 77 70 - 99  mg/dL    Comment: Glucose reference range applies only to samples taken after fasting for at least 8 hours.  Glucose, capillary     Status: Abnormal   Collection Time: 11/03/22  5:22 PM  Result Value Ref Range   Glucose-Capillary 69 (L) 70 - 99 mg/dL    Comment: Glucose reference range applies only to samples taken after fasting for at least 8 hours.  Glucose, capillary     Status: Abnormal   Collection Time: 11/03/22  5:25 PM  Result Value Ref Range   Glucose-Capillary 65 (L) 70 - 99 mg/dL    Comment: Glucose reference range applies only to samples taken after fasting for at least 8 hours.  Glucose, capillary     Status: Abnormal   Collection Time: 11/03/22  6:12 PM  Result Value Ref Range   Glucose-Capillary 105 (H) 70 - 99 mg/dL    Comment: Glucose reference range applies only to samples taken after fasting for at least 8 hours.  Prepare RBC (crossmatch)     Status: None   Collection Time: 11/03/22  7:45 PM  Result Value Ref Range   Order Confirmation      ORDER PROCESSED BY BLOOD BANK Performed at [facility], [place] [address], [place], [place] [address]   Glucose,  capillary     Status: Abnormal   Collection Time: 11/03/22  9:32 PM  Result Value Ref Range   Glucose-Capillary 115 (H) 70 - 99 mg/dL    Comment: Glucose reference range applies only to samples taken after fasting for at least 8 hours.  Renal function panel     Status: Abnormal   Collection Time: 11/04/22 12:51 AM  Result Value Ref Range   Sodium 136 135 - 145 mmol/L   Potassium 4.1 3.5 - 5.1 mmol/L   Chloride 101 98 - 111 mmol/L   CO2 26 22 - 32 mmol/L   Glucose, Bld 102 (H) 70 - 99 mg/dL    Comment: Glucose reference range applies only to samples taken after fasting for at least 8 hours.   BUN 30 (H) 8 - 23 mg/dL   Creatinine, Ser 3.30 (H) 0.44 - 1.00 mg/dL   Calcium 7.5 (L) 8.9 - 10.3 mg/dL   Phosphorus 3.9 2.5 - 4.6 mg/dL   Albumin 2.3 (L) 3.5 - 5.0 g/dL   GFR, Estimated 14 (L) >60 mL/min     Comment: (NOTE) Calculated using the CKD-EPI Creatinine Equation (2021)    Anion gap 9 5 - 15    Comment: Performed at [facility] [address] [place], [place] [address]  CBC     Status: Abnormal   Collection Time: 11/04/22 12:51 AM  Result Value Ref Range   WBC 5.4 4.0 - 10.5 K/uL   RBC 2.76 (L) 3.87 - 5.11 MIL/uL   Hemoglobin 7.4 (L) 12.0 - 15.0 g/dL   HCT 24.8 (L) 36.0 - 46.0 %   MCV 89.9 80.0 - 100.0 fL   MCH 26.8 26.0 - 34.0 pg   MCHC 29.8 (L) 30.0 - 36.0 g/dL   RDW 18.1 (H) 11.5 - 15.5 %   Platelets 65 (L) 150 - 400 K/uL    Comment: Immature Platelet Fraction may be clinically indicated, consider ordering this additional test [id number] REPEATED TO VERIFY    nRBC 0.0 0.0 - 0.2 %    Comment: Performed at [facility], [place] [address], [place], [place] [address]  Prepare RBC (crossmatch)     Status: None   Collection Time: 11/04/22  1:41 AM  Result Value Ref Range   Order Confirmation      ORDER PROCESSED BY BLOOD BANK Performed at [facility], [place] [address], [place], [place] [address]   Hemoglobin and hematocrit, blood     Status: Abnormal   Collection Time: 11/04/22  7:29 AM  Result Value Ref Range   Hemoglobin 8.0 (L) 12.0 - 15.0 g/dL   HCT 25.0 (L) 36.0 - 46.0 %    Comment: Performed at [facility], [place] [address] [place], [place] [address]  Glucose, capillary     Status: None   Collection Time: 11/04/22  8:35 AM  Result Value Ref Range   Glucose-Capillary 81 70 - 99 mg/dL    Comment: Glucose reference range applies only to samples taken after fasting for at least 8 hours.  Glucose, capillary     Status: None   Collection Time: 11/04/22 11:44 AM  Result Value Ref Range   Glucose-Capillary 72 70 - 99 mg/dL    Comment: Glucose reference range applies only to samples taken after fasting for at least 8 hours.  Hemoglobin and hematocrit, blood     Status: Abnormal   Collection Time: 11/04/22  2:28 PM  Result Value Ref Range  Hemoglobin 7.5 (L) 12.0 - 15.0 g/dL   HCT 23.3 (L) 36.0 - 46.0 %    Comment: Performed at [facility], [place] [address] [place], [place] [address]  Glucose, capillary     Status: None   Collection Time: 11/04/22  3:55 PM  Result Value Ref Range   Glucose-Capillary 79 70 - 99 mg/dL    Comment: Glucose reference range applies only to samples taken after fasting for at least 8 hours.  Prepare RBC (crossmatch)     Status: None   Collection Time: 11/04/22  5:30 PM  Result Value Ref Range   Order Confirmation      ORDER PROCESSED BY BLOOD BANK Performed at [facility], [place] [address] [place], [place] [address]   Glucose, capillary     Status: None   Collection Time: 11/04/22  9:25 PM  Result Value Ref Range   Glucose-Capillary 74 70 - 99 mg/dL    Comment: Glucose reference range applies only to samples taken after fasting for at least 8 hours.  Renal function panel     Status: Abnormal   Collection Time: 11/05/22  3:04 AM  Result Value Ref Range   Sodium 135 135 - 145 mmol/L   Potassium 4.0 3.5 - 5.1 mmol/L   Chloride 101 98 - 111 mmol/L   CO2 23 22 - 32 mmol/L   Glucose, Bld 75 70 - 99 mg/dL    Comment: Glucose reference range applies only to samples taken after fasting for at least 8 hours.   BUN 41 (H) 8 - 23 mg/dL   Creatinine, Ser 4.14 (H) 0.44 - 1.00 mg/dL   Calcium 7.3 (L) 8.9 - 10.3 mg/dL   Phosphorus 4.5 2.5 - 4.6 mg/dL   Albumin 2.3 (L) 3.5 - 5.0 g/dL   GFR, Estimated 11 (L) >60 mL/min    Comment: (NOTE) Calculated using the CKD-EPI Creatinine Equation (2021)    Anion gap 11 5 - 15    Comment: Performed at [facility] [address], [place], [place] [address]  CBC     Status: Abnormal   Collection Time: 11/05/22  3:04 AM  Result Value Ref Range   WBC 4.6 4.0 - 10.5 K/uL   RBC 3.22 (L) 3.87 - 5.11 MIL/uL   Hemoglobin 9.0 (L) 12.0 - 15.0 g/dL   HCT 29.0 (L) 36.0 - 46.0 %   MCV 90.1 80.0 - 100.0 fL   MCH 28.0 26.0 - 34.0 pg   MCHC 31.0 30.0 -  36.0 g/dL   RDW 17.3 (H) 11.5 - 15.5 %   Platelets 56 (L) 150 - 400 K/uL    Comment: Immature Platelet Fraction may be clinically indicated, consider ordering this additional test [id number] REPEATED TO VERIFY    nRBC 0.0 0.0 - 0.2 %    Comment: Performed at [facility], [place] [address] [place],  [address]  Glucose, capillary     Status: None   Collection Time: 11/05/22  5:13 AM  Result Value Ref Range   Glucose-Capillary 72 70 - 99 mg/dL    Comment: Glucose reference range applies only to samples taken after fasting for at least 8 hours.  Glucose, capillary     Status: None   Collection Time: 11/05/22  8:44 AM  Result Value Ref Range   Glucose-Capillary 71 70 - 99 mg/dL    Comment: Glucose reference range applies only to samples taken after fasting for at least 8 hours.  No results found for this or any previous visit (from the past 240 hour(s)).  Lipid Panel No results for input(s): "CHOL", "TRIG", "HDL", "CHOLHDL", "VLDL", "LDLCALC" in the last 72 hours.  Studies/Results: CT ABDOMEN PELVIS WO CONTRAST  Result Date: 11/04/2022 CLINICAL DATA:  Anemia EXAM: CT ABDOMEN AND PELVIS WITHOUT CONTRAST TECHNIQUE: Multidetector CT imaging of the abdomen and pelvis was performed following the standard protocol without IV contrast. RADIATION DOSE REDUCTION: This exam was performed according to the departmental dose-optimization program which includes automated exposure control, adjustment of the mA and/or kV according to patient size and/or use of iterative reconstruction technique. COMPARISON:  10/20/2022 FINDINGS: Lower chest: Bilateral pleural effusions are noted slightly larger on the right than the left. These are increased from the prior exam. Lower lobe consolidation is noted also right greater than left. Hepatobiliary: No focal liver abnormality is seen. No gallstones, gallbladder wall thickening, or biliary dilatation. Pancreas: Unremarkable. No pancreatic ductal  dilatation or surrounding inflammatory changes. Spleen: Normal in size without focal abnormality. Adrenals/Urinary Tract: Adrenal glands are stable from the prior exam. Again a small 11 mm nodule is noted within the left adrenal gland consistent with small adenoma unchanged from 20/10. Kidneys are well visualized bilaterally with diffuse cortical thinning. Cystic changes are noted similar to that seen on the prior exam. No obstructive changes are seen. No renal calculi are noted. The bladder is partially distended. Stomach/Bowel: There is again noted wall thickening in the sigmoid colon related to diverticular change. The degree of inflammatory change has improved in the interval from the prior study. No abscess is identified. More proximal colon is within normal limits with the exception of diverticular change. Stomach is within normal limits. No obstructive changes in the small bowel are noted. Vascular/Lymphatic: Aortic atherosclerosis. No enlarged abdominal or pelvic lymph nodes. Reproductive: Uterus is within normal limits. Calcifications in the right ovary are again noted and stable. A simple appearing left ovarian cyst is noted measuring 3.6 cm. This is stable from the prior exam Other: No abdominal wall hernia or abnormality. No abdominopelvic ascites. Musculoskeletal: No acute or significant osseous findings. Old rib fractures are noted on the left and stable. IMPRESSION: Persistent changes of previous diverticulitis with persistent wall thickening. The degree of inflammatory changes have improved in the interval from the prior exam. 3.6 cm left simple appearing ovarian cyst. Recommend follow-up US in 6-12 months. Note: This recommendation does not apply to premenarchal patients and to those with increased risk (genetic, family history, elevated tumor markers or other high-risk factors) of ovarian cancer. Reference: JACR 2020 Feb; 17(2):248-254 Stable left adrenal adenoma. No follow-up is recommended as it  is been stable for over 13 years. Small effusions and lower lobe consolidation increased in the interval from the prior exam. Exophytic cyst in the right kidney similar to that seen on the prior exam. Again nonemergent outpatient MRI is recommended for further evaluation. Electronically Signed   By: Inez Catalina M.D.   On: 11/04/2022 17:14   ECHOCARDIOGRAM COMPLETE  Result Date: 11/04/2022    ECHOCARDIOGRAM REPORT   Patient Name:   Jean Mcclain Date of Exam: 11/04/2022 Medical Rec #:  [id number]        Height:       60.0 in Accession #:    [id number]       Weight:       151.0 lb Date of Birth:  27-Oct-1945         BSA:  1.657 m Patient Age:    89 years         BP:           101/54 mmHg Patient Gender: F                HR:           70 bpm. Exam Location:  Inpatient Procedure: 2D Echo Indications:    syncope  History:        Patient has prior history of Echocardiogram examinations, most                 recent 07/22/2022. End stage renal disease. Parkinson's                 disease.; Risk Factors:Sleep Apnea and Diabetes.  Sonographer:    Johny Chess RDCS Referring Phys: 23 [place]  1. Left ventricular ejection fraction, by estimation, is 60 to 65%. The left ventricle has normal function. The left ventricle has no regional wall motion abnormalities. There is mild left ventricular hypertrophy. Left ventricular diastolic parameters are consistent with Grade I diastolic dysfunction (impaired relaxation).  2. Right ventricular systolic function is normal. The right ventricular size is normal.  3. Left atrial size was mildly dilated.  4. The mitral valve is abnormal. Trivial mitral valve regurgitation. No evidence of mitral stenosis. Severe mitral annular calcification.  5. The aortic valve is tricuspid. There is moderate calcification of the aortic valve. There is moderate thickening of the aortic valve. Aortic valve regurgitation is not visualized. Aortic valve  sclerosis/calcification is present, without any evidence of aortic stenosis.  6. The inferior vena cava is normal in size with greater than 50% respiratory variability, suggesting right atrial pressure of 3 mmHg. FINDINGS  Left Ventricle: Left ventricular ejection fraction, by estimation, is 60 to 65%. The left ventricle has normal function. The left ventricle has no regional wall motion abnormalities. The left ventricular internal cavity size was normal in size. There is  mild left ventricular hypertrophy. Left ventricular diastolic parameters are consistent with Grade I diastolic dysfunction (impaired relaxation). Right Ventricle: The right ventricular size is normal. No increase in right ventricular wall thickness. Right ventricular systolic function is normal. Left Atrium: Left atrial size was mildly dilated. Right Atrium: Right atrial size was normal in size. Pericardium: There is no evidence of pericardial effusion. Mitral Valve: The mitral valve is abnormal. There is mild thickening of the mitral valve leaflet(s). There is mild calcification of the mitral valve leaflet(s). Severe mitral annular calcification. Trivial mitral valve regurgitation. No evidence of mitral valve stenosis. MV peak gradient, 5.6 mmHg. The mean mitral valve gradient is 2.0 mmHg. Tricuspid Valve: The tricuspid valve is normal in structure. Tricuspid valve regurgitation is mild . No evidence of tricuspid stenosis. Aortic Valve: The aortic valve is tricuspid. There is moderate calcification of the aortic valve. There is moderate thickening of the aortic valve. Aortic valve regurgitation is not visualized. Aortic valve sclerosis/calcification is present, without any  evidence of aortic stenosis. Pulmonic Valve: The pulmonic valve was normal in structure. Pulmonic valve regurgitation is trivial. No evidence of pulmonic stenosis. Aorta: The aortic root is normal in size and structure. Venous: The inferior vena cava is normal in size with  greater than 50% respiratory variability, suggesting right atrial pressure of 3 mmHg. IAS/Shunts: No atrial level shunt detected by color flow Doppler.  LEFT VENTRICLE PLAX 2D LVIDd:         3.90 cm  Diastology LVIDs:         2.20 cm   LV e' medial:    7.30 cm/s LV PW:         1.00 cm   LV E/e' medial:  12.6 LV IVS:        1.20 cm   LV e' lateral:   9.17 cm/s LVOT diam:     1.80 cm   LV E/e' lateral: 10.1 LV SV:         50 LV SV Index:   30 LVOT Area:     2.54 cm  RIGHT VENTRICLE             IVC RV Basal diam:  2.60 cm     IVC diam: 1.10 cm RV S prime:     10.40 cm/s TAPSE (M-mode): 2.2 cm LEFT ATRIUM             Index        RIGHT ATRIUM           Index LA diam:        3.80 cm 2.29 cm/m   RA Area:     12.80 cm LA Vol (A2C):   68.2 ml 41.17 ml/m  RA Volume:   28.60 ml  17.27 ml/m LA Vol (A4C):   60.2 ml 36.34 ml/m LA Biplane Vol: 66.6 ml 40.20 ml/m  AORTIC VALVE LVOT Vmax:   89.60 cm/s LVOT Vmean:  58.400 cm/s LVOT VTI:    0.198 m  AORTA Ao Root diam: 2.90 cm Ao Asc diam:  3.20 cm MITRAL VALVE MV Area (PHT): 2.56 cm     SHUNTS MV Area VTI:   1.48 cm     Systemic VTI:  0.20 m MV Peak grad:  5.6 mmHg     Systemic Diam: 1.80 cm MV Mean grad:  2.0 mmHg MV Vmax:       1.18 m/s MV Vmean:      69.7 cm/s MV Decel Time: 296 msec MV E velocity: 92.20 cm/s MV A velocity: 113.00 cm/s MV E/A ratio:  0.82 Jenkins Rouge MD Electronically signed by Jenkins Rouge MD Signature Date/Time: 11/04/2022/12:40:21 PM    Final     Medications: Scheduled:  atorvastatin  40 mg Oral Daily   buprenorphine  1 patch Transdermal Weekly   calcitRIOL  1.25 mcg Oral Q M,W,F-HD   camphor-menthol  1 Application Topical Daily   carbidopa-levodopa  2 tablet Oral q AM   And   carbidopa-levodopa  1 tablet Oral BID   Chlorhexidine Gluconate Cloth  6 each Topical Q0600   darbepoetin (ARANESP) injection - DIALYSIS  200 mcg Subcutaneous Q Fri-1800   feeding supplement (NEPRO CARB STEADY)  237 mL Oral TID BM   melatonin  5 mg Oral QHS    midodrine  10 mg Oral TID WC   mirtazapine  15 mg Oral QHS   Muscle Rub   Topical TID   nystatin  1 Application Topical Daily   pantoprazole (PROTONIX) IV  40 mg Intravenous Q12H   phenytoin (DILANTIN) IV  50 mg Intravenous Q8H   rOPINIRole  12 mg Oral Daily   sodium chloride flush  3 mL Intravenous Q12H   Continuous:  anticoagulant sodium citrate      Assessment: 77 year old wheelchair-bound female with history of hypertension, hyperlipidemia, stroke, diabetes, end-stage renal disease on hemodialysis, Parkinsonism, RLS and myoclonus who has been having multiple seizure-like episodes. - Exam today reveals an awake patient with decreased level of alertness who  follows all commands without asymmetry on motor exam.  -Seizures are currently managed with Dilantin. Keppra was initially started which had to be stopped due to thrombocytopenia.  Also stopped Depakote as patient was having unclear source of bleeding and already was thrombocytopenic. Could not tolerate Vimpat due to bradycardia - LTM EEG report for this morning (Saturday): Continuous slow, generalized. This study is suggestive of moderate diffuse encephalopathy, nonspecific to etiology.  No epileptiform discharges were seen throughout the recording. 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 Her acute encephalopathy is now resolved. Of note, she did have an episode earlier this week that looked more typical of myoclonic seizures which can happen in patients with ESRD.   Recommendations: -On Dilantin 50 mg every 8 hours. -Ordering a phenytoin level -Continue LTM EEG to monitor for seizure recurrence -If seizures persist, can consider adding Onfi -Dr. Hortense Ramal discussed the patient with dialysis nurse practitioner Juanell Fairly who has been with the patient for multiple years and is  very familiar with her history.  Plan is to minimize sudden blood pressure fluctuations during dialysis -Management of rest of comorbidities per primary team   LOS: 11 days   '@Electronically'$  signed: Dr. Kerney Elbe 11/05/2022  11:18 AM

## 2022-11-05 NOTE — Progress Notes (Signed)
[name] CBG when arrived by from HD 48. [name] mental status continues to be in and out, pt unable to consume oral agent to increase CBG. Pt does not have IV access currently so unable to give dextrose push. Order for glucagon '1mg'$  IM placed.   Paged IV team to see when PICC line will be inserted, was informed that PICC nurse is at [facility] right now but is aware of pt's need for IV access, especially with low cbgs and known seizures during this admission. MD aware of situation.

## 2022-11-05 NOTE — Procedures (Signed)
HD Note:  Some information was entered later than the data was gathered due to patient care needs. The stated time with the data is accurate.  Received patient in bed to unit.  Alert and oriented, patient somnolent. Informed consent signed and in chart.   Verbal order from Juanell Fairly to keep SBP at 120 or above for UF pull. Patient goal was changed to keep even. At 1530, Patient's SBP went below 100.  Dr Jonnie Finner and Juanell Fairly, PA were contacted and it was directed to keep her SBP above 100. Also 200-300 ml can be given  to achieve this to allow HD for as long as tolerated.  Patient began complaining of chest pain. She does have muscle tenderness, but with some questioning stated it was inside.  She stated she just didn't feel well.  Treatment was terminated. Rapid Response was called.  Dr Jonnie Finner came bedside for assessment.  EEG notified and will come when transport arrives to move the machine.  Transported back to the room   Hand-off given to patient's nurse.   Access used: left upper arm fistula Access issues: None  Total UF removed: She received 3106m during treatment.   [facility]

## 2022-11-05 NOTE — Procedures (Signed)
Patient Name: Jean Mcclain  MRN: [id number]  Epilepsy Attending: Lora Havens  Referring Physician/Provider: Lora Havens, MD  Duration: 11/04/2022 0915 to 11/05/2022 0915   Patient history: 77 year old female with seizure-like episodes.  EEG to evaluate for seizure.   Level of alertness: Awake, asleep   AEDs during EEG study: PHT   Technical aspects: This EEG study was done with scalp electrodes positioned according to the 10-20 International system of electrode placement. Electrical activity was reviewed with band pass filter of 1-'70Hz'$ , sensitivity of 7 uV/mm, display speed of 29m/sec with a '60Hz'$  notched filter applied as appropriate. EEG data were recorded continuously and digitally stored.  Video monitoring was available and reviewed as appropriate.   Description: The posterior dominant rhythm consists of '8Hz'$  activity of moderate voltage (25-35 uV) seen predominantly in posterior head regions, symmetric and reactive to eye opening and eye closing. Sleep was characterized by vertex waves, sleep spindles (12 to 14 Hz), maximal frontocentral region. EEG showed continuous generalized predominantly 5 to 7 Hz theta slowing admixed with 2 to 3 Hz generalized delta slowing. Hyperventilation and photic stimulation were not performed.      ABNORMALITY -Continuous slow, generalized   IMPRESSION: This study is suggestive of moderate diffuse encephalopathy, nonspecific etiology.  No epileptiform discharges were seen throughout the recording.   [name]

## 2022-11-05 NOTE — Progress Notes (Signed)
Peripherally Inserted Central Catheter Placement  The IV Nurse has discussed with the patient and/or persons authorized to consent for the patient, the purpose of this procedure and the potential benefits and risks involved with this procedure.  The benefits include less needle sticks, lab draws from the catheter, and the patient may be discharged home with the catheter. Risks include, but not limited to, infection, bleeding, blood clot (thrombus formation), and puncture of an artery; nerve damage and irregular heartbeat and possibility to perform a PICC exchange if needed/ordered by physician.  Alternatives to this procedure were also discussed.  Bard Power PICC patient education guide, fact sheet on infection prevention and patient information card has been provided to patient /or left at bedside.    PICC Placement Documentation  PICC Double Lumen 11/05/22 Left Brachial 41 cm 0 cm (Active)  Indication for Insertion or Continuance of Line Poor Vasculature-patient has had multiple peripheral attempts or PIVs lasting less than 24 hours 11/05/22 2007  Exposed Catheter (cm) 0 cm 11/05/22 2007  Site Assessment Clean, Dry, Intact 11/05/22 2007  Lumen #1 Status Flushed;Saline locked;Blood return noted 11/05/22 2007  Lumen #2 Status Flushed;Saline locked;Blood return noted 11/05/22 2007  Dressing Type Transparent;Securing device 11/05/22 2007  Dressing Status Antimicrobial disc in place;Clean, Dry, Intact 11/05/22 2007  Safety Lock Not Applicable 41/74/08 1448  Line Care Connections checked and tightened 11/05/22 2007  Line Adjustment (NICU/IV Team Only) No 11/05/22 2007  Dressing Intervention New dressing 11/05/22 2007  Dressing Change Due 11/12/22 11/05/22 2007       Rolena Infante 11/05/2022, 8:07 PM

## 2022-11-05 NOTE — Progress Notes (Signed)
PICC placed per MD request.  RUE restricted due to HD access.  RUE with significant deep purple bruising present.  Hematoma present near [facility] area. Bruising soft, vessels easily compressible. No further bruising present from PICC insertion noted.  Bruising extends from [facility] to lateral chest,abdomen to axillary region.

## 2022-11-05 NOTE — Progress Notes (Signed)
MB Transferred Pt from 5W25 up to [facility] for her treatment today. Transport and/or Dialysis Nurse will call to inform EEG when patient is done so that patient can be transported back to 5W25. Nurse was informed that there would be NO third shift Tech and only a call Tech for Emergency callback only. MB wrote down and gave her the EEG Lab number and will f/u to give her the additional Call Phone number to call in case no-one is available in the EEG lab during the time of Transporting.

## 2022-11-06 ENCOUNTER — Encounter (HOSPITAL_COMMUNITY): Payer: Self-pay | Admitting: Internal Medicine

## 2022-11-06 DIAGNOSIS — R4182 Altered mental status, unspecified: Secondary | ICD-10-CM | POA: Diagnosis not present

## 2022-11-06 DIAGNOSIS — N186 End stage renal disease: Secondary | ICD-10-CM | POA: Diagnosis not present

## 2022-11-06 DIAGNOSIS — D631 Anemia in chronic kidney disease: Secondary | ICD-10-CM

## 2022-11-06 DIAGNOSIS — N189 Chronic kidney disease, unspecified: Secondary | ICD-10-CM | POA: Diagnosis not present

## 2022-11-06 DIAGNOSIS — Z992 Dependence on renal dialysis: Secondary | ICD-10-CM | POA: Diagnosis not present

## 2022-11-06 DIAGNOSIS — Z515 Encounter for palliative care: Secondary | ICD-10-CM | POA: Diagnosis not present

## 2022-11-06 DIAGNOSIS — Z7189 Other specified counseling: Secondary | ICD-10-CM | POA: Diagnosis not present

## 2022-11-06 DIAGNOSIS — R569 Unspecified convulsions: Secondary | ICD-10-CM | POA: Diagnosis not present

## 2022-11-06 DIAGNOSIS — D649 Anemia, unspecified: Secondary | ICD-10-CM | POA: Diagnosis not present

## 2022-11-06 LAB — RENAL FUNCTION PANEL
Albumin: 2 g/dL — ABNORMAL LOW (ref 3.5–5.0)
Anion gap: 8 (ref 5–15)
BUN: 35 mg/dL — ABNORMAL HIGH (ref 8–23)
CO2: 26 mmol/L (ref 22–32)
Calcium: 6.9 mg/dL — ABNORMAL LOW (ref 8.9–10.3)
Chloride: 102 mmol/L (ref 98–111)
Creatinine, Ser: 3.3 mg/dL — ABNORMAL HIGH (ref 0.44–1.00)
GFR, Estimated: 14 mL/min — ABNORMAL LOW (ref >60)
Glucose, Bld: 72 mg/dL (ref 70–99)
Phosphorus: 3.5 mg/dL (ref 2.5–4.6)
Potassium: 3.7 mmol/L (ref 3.5–5.1)
Sodium: 136 mmol/L (ref 135–145)

## 2022-11-06 LAB — CBC
HCT: 27.4 % — ABNORMAL LOW (ref 36.0–46.0)
Hemoglobin: 8.5 g/dL — ABNORMAL LOW (ref 12.0–15.0)
MCH: 27.7 pg (ref 26.0–34.0)
MCHC: 31 g/dL (ref 30.0–36.0)
MCV: 89.3 fL (ref 80.0–100.0)
Platelets: 48 10*3/uL — ABNORMAL LOW (ref 150–400)
RBC: 3.07 MIL/uL — ABNORMAL LOW (ref 3.87–5.11)
RDW: 17.6 % — ABNORMAL HIGH (ref 11.5–15.5)
WBC: 4.1 10*3/uL (ref 4.0–10.5)
nRBC: 0 % (ref 0.0–0.2)

## 2022-11-06 LAB — GLUCOSE, CAPILLARY: Glucose-Capillary: 70 mg/dL (ref 70–99)

## 2022-11-06 MED ORDER — POLYVINYL ALCOHOL 1.4 % OP SOLN: 1.0000 [drp] | Freq: Four times a day (QID) | OPHTHALMIC | Status: DC | PRN

## 2022-11-06 MED ORDER — GLYCOPYRROLATE 0.2 MG/ML IJ SOLN: 0.2000 mg | INTRAMUSCULAR | Status: DC | PRN

## 2022-11-06 MED ORDER — HYDROMORPHONE HCL 1 MG/ML IJ SOLN
0.5000 mg | INTRAMUSCULAR | Status: DC | PRN
  Administered 2022-11-07 – 2022-11-08 (×3): 0.5 mg via INTRAVENOUS
  Filled 2022-11-06 (×3): qty 0.5

## 2022-11-06 MED ORDER — HYDROMORPHONE HCL 1 MG/ML IJ SOLN
0.5000 mg | Freq: Three times a day (TID) | INTRAMUSCULAR | Status: DC
  Administered 2022-11-06 – 2022-11-09 (×8): 0.5 mg via INTRAVENOUS
  Filled 2022-11-06 (×8): qty 0.5

## 2022-11-06 MED ORDER — GLYCOPYRROLATE 1 MG PO TABS: 1.0000 mg | ORAL_TABLET | ORAL | Status: DC | PRN

## 2022-11-06 MED ORDER — MIDAZOLAM HCL 2 MG/2ML IJ SOLN
2.0000 mg | INTRAMUSCULAR | Status: DC | PRN
  Administered 2022-11-08 (×3): 2 mg via INTRAVENOUS
  Filled 2022-11-06 (×3): qty 2

## 2022-11-06 MED ORDER — BIOTENE DRY MOUTH MT LIQD: 15.0000 mL | OROMUCOSAL | Status: DC | PRN

## 2022-11-06 NOTE — Progress Notes (Signed)
Palliative Medicine Inpatient Follow Up Note HPI: Jean Mcclain is a 77 y.o. female with medical history significant of hypertension, hyperlipidemia, diabetes mellitus type II, ESRD on HD(M/W/F), chronic respiratory failure with hypoxia on 3 L of oxygen at night, Parkinson's disease, history of DVT not on anticoagulation, and wheelchair-bound due to left ankle fracture who presents for seizures.    Palliative care has been asked to get involved to discuss goals in the setting of declining health state due to recurrent seizures from  a recent underlying stroke.   Today's Discussion 11/06/2022  *Please note that this is a verbal dictation therefore any spelling or grammatical errors are due to the "Gray One" system interpretation.  Chart reviewed inclusive of vital signs, progress notes, laboratory results, and diagnostic images.   Patient is more alert today and fully oriented.  Patient this morning has pain in her lower back. I was able to assist patients RN, Jean Mcclain in repositioning the patient.   I received a secure chat that patient is now at a point where her body cannot tolerate dialysis. A family meeting was then planned. _________________________________________________  A family meeting was held with Dr. Jonnie Mcclain, Jean Mcclain, myself and patients brother Jean Mcclain, and niece, Jean Mcclain.  Created space and opportunity for patients family to explore thoughts feelings and fears regarding current medical situation.  Jean Mcclain explained that [place] could not tolerate HD yesterday. Her body is too weak and her blood pressures are unable to be supported. She emphasized that additional hemodialysis will not benefit [name] and her body will not be able to sustain in getting it. At this time HD is no longer an option.  We reviewed without dialysis [name]'s time on earth will be limited to days. Discussed that will likely not withstand living > 2 weeks. Reviewed that her  body will weaken over time and the toxins from the lack of dialysis will build up causing her to be more somnolent. This would ideally result in a peaceful passing.   Discussed the options of IP hospice or hospice at [facility]. Patients family would ideally prefer for inpatient hospice and are interesting in exploring the hospice homes in [place].   Reviewed the steps to make [facility] comfortable from here. We talked about transition to comfort measures in house and what that would entail inclusive of medications to control pain, dyspnea, agitation, nausea, itching, and hiccups.    We discussed stopping all uneccessary measures such as cardiac monitoring, blood draws, needle sticks, and frequent vital signs. Utilized reflective listening throughout our time together.   Spoke to family about the difficulty of this situation.   I met with Jean Mcclain at bedside and she shares, "there's nothing they can do". I told her we can change our focus to keeping her comfortable which I think we can do very well. Listened to her fears about dying. Comfort provided through reflective listening.  Questions and concerns addressed/Palliative Support Provided.   Objective Assessment: Vital Signs Vitals:   11/06/22 0008 11/06/22 0813  BP:  (!) 112/42  Pulse:  66  Resp: 16 20  Temp: 97.7 F (36.5 C) 98.7 F (37.1 C)  SpO2:  95%    Intake/Output Summary (Last 24 hours) at 11/06/2022 1253 Last data filed at 11/05/2022 2042 Gross per 24 hour  Intake --  Output 1 ml  Net -1 ml   Last Weight  Most recent update: 11/05/2022  5:39 PM    Weight  65.7 kg (  144 lb 13.5 oz)            Gen:  Elderly F in NAD HEENT: Dry mucous membranes CV: Regular rate and rhythm  PULM: On 2LPM [place], breathing is even and nonlabored  ABD: soft/nontender  EXT: No edema  Neuro: Alert and oriented to self and place  SUMMARY OF RECOMMENDATIONS   DNAR/DNI   Patient is no longer a candidate for  dialysis  Transition focus to comfort care  Low dose dose dilaudid Q8H for LBP  Appreciate TOC helping navigate IP hospice placement --> Family interested in touring hospice homes in [facility] and [place]   Ongoing PMT support  Time Spent: 72 Billing based on MDM: High ______________________________________________________________________________________ [place] Team Team Cell Phone: [phone] Please utilize secure chat with additional questions, if there is no response within 30 minutes please call the above phone number  Palliative Medicine Team providers are available by phone from 7am to 7pm daily and can be reached through the team cell phone.  Should this patient require assistance outside of these hours, please call the patient's attending physician.

## 2022-11-06 NOTE — Procedures (Signed)
Patient Name: BEANCA KIESTER  MRN: [id number]  Epilepsy Attending: Lora Havens  Referring Physician/Provider: Lora Havens, MD  Duration: 11/05/2022 0915 to 11/06/2022 0915   Patient history: 77 year old female with seizure-like episodes.  EEG to evaluate for seizure.   Level of alertness: Awake, asleep   AEDs during EEG study: PHT   Technical aspects: This EEG study was done with scalp electrodes positioned according to the 10-20 International system of electrode placement. Electrical activity was reviewed with band pass filter of 1-'70Hz'$ , sensitivity of 7 uV/mm, display speed of 29m/sec with a '60Hz'$  notched filter applied as appropriate. EEG data were recorded continuously and digitally stored.  Video monitoring was available and reviewed as appropriate.   Description: The posterior dominant rhythm consists of '8Hz'$  activity of moderate voltage (25-35 uV) seen predominantly in posterior head regions, symmetric and reactive to eye opening and eye closing. Sleep was characterized by vertex waves, sleep spindles (12 to 14 Hz), maximal frontocentral region. EEG showed continuous generalized predominantly 5 to 7 Hz theta slowing admixed with 2 to 3 Hz generalized delta slowing. Hyperventilation and photic stimulation were not performed.      ABNORMALITY -Continuous slow, generalized   IMPRESSION: This study is suggestive of moderate diffuse encephalopathy, nonspecific etiology.  No epileptiform discharges were seen throughout the recording.   Keyly Baldonado [name]

## 2022-11-06 NOTE — Progress Notes (Signed)
Pt is now on comfort care, no further dialysis. Will sign off.   Kelly Splinter, MD 11/06/2022, 7:20 PM

## 2022-11-06 NOTE — Progress Notes (Addendum)
PROGRESS NOTE    Jean Mcclain  MBT:[id number] DOB: [birth year] DOA: 10/24/2022 PCP: Pcp, No   Chief Complaint  Patient presents with   Shaking    Brief Narrative:    Jean Mcclain is a 77 y.o. female with medical history significant of hypertension, hyperlipidemia, diabetes mellitus type II, ESRD on HD(M/W/F), chronic respiratory failure with hypoxia on 3 L of oxygen at night, Parkinson's disease, history of DVT not on anticoagulation, and wheelchair-bound due to left ankle fracture who presents for worsening shaking and jerking found to have seizures.  She was seen by neurology for evaluation for new onset seizures, which was felt  provoked by diagnosis of CVA (not acute), CVA happened over the last few month, as it was not present on MRI on 07/2022 , multiple seizures significant, for which she has been followed closely by neurology, where she was kept in LTM EEG when her medication has been adjusted , patient with significant failure to thrive, protein calorie malnutrition, in the setting of poor oral intake over the last few month secondary to persistent nausea and vomiting , she has been treated for diverticulitis last year, plan for colonoscopy as an outpatient as she never had colonoscopy in the past, it was a concern of chronic blood loss anemia from GI source, GI were consulted, plan was for endoscopy and at 1 point colonoscopy, but unfortunately she was unstable for procedure, where it had to be canceled twice for seizures, and low blood pressure, she did have anemia of acute blood loss, source most likely significant left shoulder/chest bruising/hematoma, likely in the setting of progressive thrombocytopenia, for which she required multiple PRBC transfusion, patient is extremely frail, deconditioned, malnourished, with very poor baseline, where she could not tolerate HD, due to very soft blood pressure, and frailty, her oral intake was very poor as well during hospital stay, and from  before due to her persistent nausea and vomiting.  Patient is approaching end-of-life, and she is unable to tolerate her hemodialysis, palliative medicine has been consulted, discussed with family and patient , transition to comfort care on 11/06/2022  Assessment & Plan:   Principal Problem:   Symptomatic anemia Active Problems:   Jerking   Acute diverticulitis   Acute on chronic respiratory failure with hypoxia (HCC)   Nausea and vomiting   ESRD on hemodialysis ([place])   Diabetes mellitus due to underlying condition, controlled, with stage 2 chronic kidney disease, with long-term current use of insulin (HCC)   Thrombocytopenia (HCC)   History of DVT (deep vein thrombosis)   Parkinson's disease   Abnormal TSH   Kidney lesion   ESRD on dialysis ([place])   Abnormal CT scan, gastrointestinal tract   Abnormal movements   Anemia   Seizure disorder ([place])   Altered mental status      Symptomatic acute on chronic blood loss anemia.   Anemia of chronic kidney disease Nausea and vomitin - Possible occult GI blood loss with recent history of diverticulitis and pending outpatient colonoscopy.   -GI has been consulted, recommendation has been for endoscopy, endoscopy, endoscopy has not been scheduled twice, and had to be canceled given patient medical instability, and recurrent seizures, and hypertension, unfortunately she remains unstable. -Quired multiple units of PRBC transfusion during hospital stay -Aranesp and IV iron per renal -She is with significant bruising in the left upper chest, shoulder and breast area, this would largely contribute to her acute blood loss anemia, was managed with supportive transfusion  -He had multiple  imaging over the last few month showing persistent sigmoid diverticulitis, descending colon without complicating features, she was treated with antibiotics for this in the past, she had no pain in that area, she had no fever, no leukocytosis, she never had colonoscopy  regarding that, and was for colonoscopy, but as mentioned above she is medically unstable for that.  New onset seizures -Was followed closely by neurology, and kept on LTM EEG where her medication has been adjusted, and she was on IV Ativan as needed  ESRD on HD.  -Management per renal, she was unable to tolerate her HD, had to be stopped prematurely in the setting of instability, low blood pressure and hypotension and clinical instability.   Incidental Neck Mass  Noted on MRI. CT soft tissue neck shows mass that is most likely benign and most consistent with dermal inclusion cyst.    Acute on chronic hypoxic respiratory failure with underlying OSA.  Resolved. At home on 3 L and here on 2-3 L of oxygen.  Likely increased initially due to low hgb.    History of DVT in 2018.  Not on anticoagulation anymore, repeat lower extremity venous ultrasound ordered during admission remains negative.   History of RLS .  Continue home regimen.   History of sick euthyroid syndrome.  Stable TSH now.   persistent thrombocytopenia.  Severe PCM FTT Chronic debility -With albumin of 2,  She is with very poor oral intake over last few months secondary to persistent nausea and vomiting.  Kidney lesion  - Noted to be increased in size on CT scan from 07/2022 for which MRI of the abdomen recommended in 6 months at that time   DM type II.  A1c is 6.9 in 07/2022 .   Lack of IV access -Required PICC line  Goals of care discussion: With family multiple times, palliative medicine assistance greatly appreciated, her prognosis remains very poor, very poor baseline, malnutrition, chronic debility, severe deconditioning and failure to thrive, this has progressed to the point where she cannot tolerate dialysis at this point, and she is with very poor oral intake, she has been transitioned to full comfort measure.  I had lengthy discussion with the patient this morning with renal team  DVT prophylaxis:SCD Code  Status: DNR Disposition:   Status is: Inpatient    Consultants:  GI Renal Neurology Palliative   Subjective:  Not able to finish her HD yesterday, with had to be cut short by 2 hours due to to instability and Objective: Vitals:   11/05/22 2003 11/05/22 2004 11/06/22 0008 11/06/22 0813  BP:  (!) 131/53  (!) 112/42  Pulse: 64 67  66  Resp: '15 16 16 20  '$ Temp:  97.9 F (36.6 C) 97.7 F (36.5 C) 98.7 F (37.1 C)  TempSrc:  Oral Axillary Axillary  SpO2: 95% 94%  95%  Weight:      Height:        Intake/Output Summary (Last 24 hours) at 11/06/2022 1347 Last data filed at 11/05/2022 2042 Gross per 24 hour  Intake --  Output 1 ml  Net -1 ml   Filed Weights   11/03/22 1600 11/04/22 0411 11/05/22 1400  Weight: 62.9 kg 68.5 kg 65.7 kg    Examination:  She is more awake, coherent and appropriate today, oriented x 3, extremely frail, ill-appearing, deconditioned  Fair air entry bilaterally anteriorly Regular rate and rhythm, no rubs or gallops Abdomen soft, nontender, bowel sounds present Extremities with no clubbing and cyanosis Significant bruising in  left shoulder, left upper chest and breast area        Data Reviewed: I have personally reviewed following labs and imaging studies  CBC: Recent Labs  Lab 11/01/22 0705 11/03/22 0457 11/04/22 0051 11/04/22 0729 11/04/22 1428 11/05/22 0304 11/06/22 0442  WBC 5.2 6.1 5.4  --   --  4.6 4.1  HGB 8.1* 7.7* 7.4* 8.0* 7.5* 9.0* 8.5*  HCT 26.5* 25.2* 24.8* 25.0* 23.3* 29.0* 27.4*  MCV 90.4 88.7 89.9  --   --  90.1 89.3  PLT 78* 71* 65*  --   --  56* 48*    Basic Metabolic Panel: Recent Labs  Lab 11/02/22 0757 11/03/22 0457 11/04/22 0051 11/05/22 0304 11/06/22 0442  NA 136 134* 136 135 136  K 3.8 4.1 4.1 4.0 3.7  CL 98 96* 101 101 102  CO2 '28 25 26 23 26  '$ GLUCOSE 78 80 102* 75 72  BUN 21 32* 30* 41* 35*  CREATININE 2.95* 3.62* 3.30* 4.14* 3.30*  CALCIUM 7.5* 7.5* 7.5* 7.3* 6.9*  PHOS 2.9 3.6 3.9 4.5  3.5    GFR: Estimated Creatinine Clearance: 12.3 mL/min (A) (by C-G formula based on SCr of 3.3 mg/dL (H)).  Liver Function Tests: Recent Labs  Lab 11/02/22 0757 11/03/22 0457 11/04/22 0051 11/05/22 0304 11/06/22 0442  ALBUMIN 2.4* 2.4* 2.3* 2.3* 2.0*    CBG: Recent Labs  Lab 11/05/22 1746 11/05/22 1805 11/05/22 1834 11/05/22 2221 11/06/22 0817  GLUCAP 59* 66* 88 96 70     No results found for this or any previous visit (from the past 240 hour(s)).       Radiology Studies: [place] EKG SITE RITE  Result Date: 11/05/2022 If Site Rite image not attached, placement could not be confirmed due to current cardiac rhythm.  CT ABDOMEN PELVIS WO CONTRAST  Result Date: 11/04/2022 CLINICAL DATA:  Anemia EXAM: CT ABDOMEN AND PELVIS WITHOUT CONTRAST TECHNIQUE: Multidetector CT imaging of the abdomen and pelvis was performed following the standard protocol without IV contrast. RADIATION DOSE REDUCTION: This exam was performed according to the departmental dose-optimization program which includes automated exposure control, adjustment of the mA and/or kV according to patient size and/or use of iterative reconstruction technique. COMPARISON:  10/20/2022 FINDINGS: Lower chest: Bilateral pleural effusions are noted slightly larger on the right than the left. These are increased from the prior exam. Lower lobe consolidation is noted also right greater than left. Hepatobiliary: No focal liver abnormality is seen. No gallstones, gallbladder wall thickening, or biliary dilatation. Pancreas: Unremarkable. No pancreatic ductal dilatation or surrounding inflammatory changes. Spleen: Normal in size without focal abnormality. Adrenals/Urinary Tract: Adrenal glands are stable from the prior exam. Again a small 11 mm nodule is noted within the left adrenal gland consistent with small adenoma unchanged from 20/10. Kidneys are well visualized bilaterally with diffuse cortical thinning. Cystic changes are noted  similar to that seen on the prior exam. No obstructive changes are seen. No renal calculi are noted. The bladder is partially distended. Stomach/Bowel: There is again noted wall thickening in the sigmoid colon related to diverticular change. The degree of inflammatory change has improved in the interval from the prior study. No abscess is identified. More proximal colon is within normal limits with the exception of diverticular change. Stomach is within normal limits. No obstructive changes in the small bowel are noted. Vascular/Lymphatic: Aortic atherosclerosis. No enlarged abdominal or pelvic lymph nodes. Reproductive: Uterus is within normal limits. Calcifications in the right ovary are again noted and  stable. A simple appearing left ovarian cyst is noted measuring 3.6 cm. This is stable from the prior exam Other: No abdominal wall hernia or abnormality. No abdominopelvic ascites. Musculoskeletal: No acute or significant osseous findings. Old rib fractures are noted on the left and stable. IMPRESSION: Persistent changes of previous diverticulitis with persistent wall thickening. The degree of inflammatory changes have improved in the interval from the prior exam. 3.6 cm left simple appearing ovarian cyst. Recommend follow-up US in 6-12 months. Note: This recommendation does not apply to premenarchal patients and to those with increased risk (genetic, family history, elevated tumor markers or other high-risk factors) of ovarian cancer. Reference: JACR 2020 Feb; 17(2):248-254 Stable left adrenal adenoma. No follow-up is recommended as it is been stable for over 13 years. Small effusions and lower lobe consolidation increased in the interval from the prior exam. Exophytic cyst in the right kidney similar to that seen on the prior exam. Again nonemergent outpatient MRI is recommended for further evaluation. Electronically Signed   By: Inez Catalina M.D.   On: 11/04/2022 17:14        Scheduled Meds:   buprenorphine  1 patch Transdermal Weekly   camphor-menthol  1 Application Topical Daily   carbidopa-levodopa  2 tablet Oral q AM   And   carbidopa-levodopa  1 tablet Oral BID   feeding supplement (NEPRO CARB STEADY)  237 mL Oral TID BM    HYDROmorphone (DILAUDID) injection  0.5 mg Intravenous Q8H   melatonin  5 mg Oral QHS   Muscle Rub   Topical TID   phenytoin (DILANTIN) IV  50 mg Intravenous Q8H   rOPINIRole  12 mg Oral Daily   Continuous Infusions:     LOS: 12 days        Phillips Climes, MD [facility]   To contact the attending provider between 7A-7P or the covering provider during after hours 7P-7A, please log into the web site [email or web address] and access using universal [place] password for that web site. If you do not have the password, please call the hospital operator.  11/06/2022, 1:47 PM

## 2022-11-06 NOTE — TOC Progression Note (Signed)
Transition of Care [facility]) - Progression Note    Patient Details  Name: Jean Mcclain MRN: [id number] Date of Birth: [birth year]  Transition of [facility]) CM/SW Contact  [name], [place], [place] Phone Number: 11/06/2022, 4:24 PM  Clinical Narrative:    Phone call to patient's brother and sister in law. Both are agreeable to Hospice Care and would like to visit both [facility] and [facility] before making a decision. They plan to visit both facilities and inform [facility] team tomorrow with the decision made.  Hung Rhinesmith, LCSW Transition of Care     Expected Discharge Plan: Assisted Living Barriers to Discharge: Continued Medical Work up  Expected Discharge Plan and Services In-house Referral: Clinical Social Work   Post Acute Care Choice: [place] arrangements for the past 2 months: [place]                                       Social Determinants of Health (SDOH) Interventions SDOH Screenings   Food Insecurity: No Food Insecurity (10/25/2022)  Housing: Low Risk  (10/25/2022)  Transportation Needs: No Transportation Needs (10/25/2022)  Utilities: Not At Risk (10/25/2022)  Depression (PHQ2-9): Low Risk  (12/06/2019)  Tobacco Use: Medium Risk (11/06/2022)    Readmission Risk Interventions    10/31/2022    9:27 AM  Readmission Risk Prevention Plan  Transportation Screening Complete  Medication Review ([place]) Complete  PCP or Specialist appointment within 3-5 days of discharge Complete  HRI or [place] Complete  SW Recovery Care/Counseling Consult Complete  [place] Not Applicable

## 2022-11-06 NOTE — Progress Notes (Addendum)
[facility] Progress Note   Subjective: Seen in room. More alert than I've seen her since 11/04/2022. C/O Nausea. Did not tolerate HD 11/05/2022. Off after 2 hours D/T hypotension. Primary attending present.   Patient is alert, oriented X 3 at present Educated patient that she is no longer tolerating HD. She says she doesn't wish to die. Palliative Care and family has been notified.   Objective Vitals:   11/05/22 2003 11/05/22 2004 11/06/22 0008 11/06/22 0813  BP:  (!) 131/53  (!) 112/42  Pulse: 64 67  66  Resp: '15 16 16 20  '$ Temp:  97.9 F (36.6 C) 97.7 F (36.5 C) 98.7 F (37.1 C)  TempSrc:  Oral Axillary Axillary  SpO2: 95% 94%  95%  Weight:      Height:       Physical Exam General: Chronically ill appearing female in NAD Heart: S1,S2 No M/R/G Lungs: CTAB, decreased in bases.  Abdomen: Soft NABS Extremities: No LE edema Neuro: Oriented to person, LOC waxes and waning.  Dialysis Access: R AVF + T/B  Labs: Basic Metabolic Panel: Recent Labs  Lab 11/04/22 0051 11/05/22 0304 11/06/22 0442  NA 136 135 136  K 4.1 4.0 3.7  CL 101 101 102  CO2 '26 23 26  '$ GLUCOSE 102* 75 72  BUN 30* 41* 35*  CREATININE 3.30* 4.14* 3.30*  CALCIUM 7.5* 7.3* 6.9*  PHOS 3.9 4.5 3.5   Liver Function Tests: Recent Labs  Lab 11/04/22 0051 11/05/22 0304 11/06/22 0442  ALBUMIN 2.3* 2.3* 2.0*   No results for input(s): "LIPASE", "AMYLASE" in the last 168 hours. CBC: Recent Labs  Lab 11/01/22 0705 11/03/22 0457 11/04/22 0051 11/04/22 0729 11/04/22 1428 11/05/22 0304 11/06/22 0442  WBC 5.2 6.1 5.4  --   --  4.6 4.1  HGB 8.1* 7.7* 7.4*   < > 7.5* 9.0* 8.5*  HCT 26.5* 25.2* 24.8*   < > 23.3* 29.0* 27.4*  MCV 90.4 88.7 89.9  --   --  90.1 89.3  PLT 78* 71* 65*  --   --  56* 48*   < > = values in this interval not displayed.   Blood Culture    Component Value Date/Time   SDES BLOOD LEFT FOREARM 09/29/2022 1716   SPECREQUEST  09/29/2022 1716    BOTTLES DRAWN  AEROBIC AND ANAEROBIC Blood Culture adequate volume   CULT  09/29/2022 1716    NO GROWTH 5 DAYS Performed at [facility], [place] [address], [place], [place] [address]    REPTSTATUS 10/04/2022 FINAL 09/29/2022 1716    Cardiac Enzymes: No results for input(s): "CKTOTAL", "CKMB", "CKMBINDEX", "TROPONINI" in the last 168 hours. CBG: Recent Labs  Lab 11/05/22 1746 11/05/22 1805 11/05/22 1834 11/05/22 2221 11/06/22 0817  GLUCAP 59* 66* 88 96 70   Iron Studies: No results for input(s): "IRON", "TIBC", "TRANSFERRIN", "FERRITIN" in the last 72 hours. '@lablastinr3'$ @ Studies/Results: [place] EKG SITE RITE  Result Date: 11/05/2022 If Site Rite image not attached, placement could not be confirmed due to current cardiac rhythm.  CT ABDOMEN PELVIS WO CONTRAST  Result Date: 11/04/2022 CLINICAL DATA:  Anemia EXAM: CT ABDOMEN AND PELVIS WITHOUT CONTRAST TECHNIQUE: Multidetector CT imaging of the abdomen and pelvis was performed following the standard protocol without IV contrast. RADIATION DOSE REDUCTION: This exam was performed according to the departmental dose-optimization program which includes automated exposure control, adjustment of the mA and/or kV according to patient size and/or use of iterative reconstruction technique. COMPARISON:  10/20/2022  FINDINGS: Lower chest: Bilateral pleural effusions are noted slightly larger on the right than the left. These are increased from the prior exam. Lower lobe consolidation is noted also right greater than left. Hepatobiliary: No focal liver abnormality is seen. No gallstones, gallbladder wall thickening, or biliary dilatation. Pancreas: Unremarkable. No pancreatic ductal dilatation or surrounding inflammatory changes. Spleen: Normal in size without focal abnormality. Adrenals/Urinary Tract: Adrenal glands are stable from the prior exam. Again a small 11 mm nodule is noted within the left adrenal gland consistent with small adenoma unchanged from 20/10.  Kidneys are well visualized bilaterally with diffuse cortical thinning. Cystic changes are noted similar to that seen on the prior exam. No obstructive changes are seen. No renal calculi are noted. The bladder is partially distended. Stomach/Bowel: There is again noted wall thickening in the sigmoid colon related to diverticular change. The degree of inflammatory change has improved in the interval from the prior study. No abscess is identified. More proximal colon is within normal limits with the exception of diverticular change. Stomach is within normal limits. No obstructive changes in the small bowel are noted. Vascular/Lymphatic: Aortic atherosclerosis. No enlarged abdominal or pelvic lymph nodes. Reproductive: Uterus is within normal limits. Calcifications in the right ovary are again noted and stable. A simple appearing left ovarian cyst is noted measuring 3.6 cm. This is stable from the prior exam Other: No abdominal wall hernia or abnormality. No abdominopelvic ascites. Musculoskeletal: No acute or significant osseous findings. Old rib fractures are noted on the left and stable. IMPRESSION: Persistent changes of previous diverticulitis with persistent wall thickening. The degree of inflammatory changes have improved in the interval from the prior exam. 3.6 cm left simple appearing ovarian cyst. Recommend follow-up US in 6-12 months. Note: This recommendation does not apply to premenarchal patients and to those with increased risk (genetic, family history, elevated tumor markers or other high-risk factors) of ovarian cancer. Reference: JACR 2020 Feb; 17(2):248-254 Stable left adrenal adenoma. No follow-up is recommended as it is been stable for over 13 years. Small effusions and lower lobe consolidation increased in the interval from the prior exam. Exophytic cyst in the right kidney similar to that seen on the prior exam. Again nonemergent outpatient MRI is recommended for further evaluation. Electronically  Signed   By: Inez Catalina M.D.   On: 11/04/2022 17:14   ECHOCARDIOGRAM COMPLETE  Result Date: 11/04/2022    ECHOCARDIOGRAM REPORT   Patient Name:   Jean Mcclain Date of Exam: 11/04/2022 Medical Rec #:  [id number]        Height:       60.0 in Accession #:    [id number]       Weight:       151.0 lb Date of Birth:  06-Sep-1946         BSA:          1.657 m Patient Age:    77 years         BP:           101/54 mmHg Patient Gender: F                HR:           70 bpm. Exam Location:  Inpatient Procedure: 2D Echo Indications:    syncope  History:        Patient has prior history of Echocardiogram examinations, most                 recent  07/22/2022. End stage renal disease. Parkinson's                 disease.; Risk Factors:Sleep Apnea and Diabetes.  Sonographer:    Johny Chess RDCS Referring Phys: 90 [place]  1. Left ventricular ejection fraction, by estimation, is 60 to 65%. The left ventricle has normal function. The left ventricle has no regional wall motion abnormalities. There is mild left ventricular hypertrophy. Left ventricular diastolic parameters are consistent with Grade I diastolic dysfunction (impaired relaxation).  2. Right ventricular systolic function is normal. The right ventricular size is normal.  3. Left atrial size was mildly dilated.  4. The mitral valve is abnormal. Trivial mitral valve regurgitation. No evidence of mitral stenosis. Severe mitral annular calcification.  5. The aortic valve is tricuspid. There is moderate calcification of the aortic valve. There is moderate thickening of the aortic valve. Aortic valve regurgitation is not visualized. Aortic valve sclerosis/calcification is present, without any evidence of aortic stenosis.  6. The inferior vena cava is normal in size with greater than 50% respiratory variability, suggesting right atrial pressure of 3 mmHg. FINDINGS  Left Ventricle: Left ventricular ejection fraction, by estimation, is 60 to 65%.  The left ventricle has normal function. The left ventricle has no regional wall motion abnormalities. The left ventricular internal cavity size was normal in size. There is  mild left ventricular hypertrophy. Left ventricular diastolic parameters are consistent with Grade I diastolic dysfunction (impaired relaxation). Right Ventricle: The right ventricular size is normal. No increase in right ventricular wall thickness. Right ventricular systolic function is normal. Left Atrium: Left atrial size was mildly dilated. Right Atrium: Right atrial size was normal in size. Pericardium: There is no evidence of pericardial effusion. Mitral Valve: The mitral valve is abnormal. There is mild thickening of the mitral valve leaflet(s). There is mild calcification of the mitral valve leaflet(s). Severe mitral annular calcification. Trivial mitral valve regurgitation. No evidence of mitral valve stenosis. MV peak gradient, 5.6 mmHg. The mean mitral valve gradient is 2.0 mmHg. Tricuspid Valve: The tricuspid valve is normal in structure. Tricuspid valve regurgitation is mild . No evidence of tricuspid stenosis. Aortic Valve: The aortic valve is tricuspid. There is moderate calcification of the aortic valve. There is moderate thickening of the aortic valve. Aortic valve regurgitation is not visualized. Aortic valve sclerosis/calcification is present, without any  evidence of aortic stenosis. Pulmonic Valve: The pulmonic valve was normal in structure. Pulmonic valve regurgitation is trivial. No evidence of pulmonic stenosis. Aorta: The aortic root is normal in size and structure. Venous: The inferior vena cava is normal in size with greater than 50% respiratory variability, suggesting right atrial pressure of 3 mmHg. IAS/Shunts: No atrial level shunt detected by color flow Doppler.  LEFT VENTRICLE PLAX 2D LVIDd:         3.90 cm   Diastology LVIDs:         2.20 cm   LV e' medial:    7.30 cm/s LV PW:         1.00 cm   LV E/e' medial:   12.6 LV IVS:        1.20 cm   LV e' lateral:   9.17 cm/s LVOT diam:     1.80 cm   LV E/e' lateral: 10.1 LV SV:         50 LV SV Index:   30 LVOT Area:     2.54 cm  RIGHT VENTRICLE  IVC RV Basal diam:  2.60 cm     IVC diam: 1.10 cm RV S prime:     10.40 cm/s TAPSE (M-mode): 2.2 cm LEFT ATRIUM             Index        RIGHT ATRIUM           Index LA diam:        3.80 cm 2.29 cm/m   RA Area:     12.80 cm LA Vol (A2C):   68.2 ml 41.17 ml/m  RA Volume:   28.60 ml  17.27 ml/m LA Vol (A4C):   60.2 ml 36.34 ml/m LA Biplane Vol: 66.6 ml 40.20 ml/m  AORTIC VALVE LVOT Vmax:   89.60 cm/s LVOT Vmean:  58.400 cm/s LVOT VTI:    0.198 m  AORTA Ao Root diam: 2.90 cm Ao Asc diam:  3.20 cm MITRAL VALVE MV Area (PHT): 2.56 cm     SHUNTS MV Area VTI:   1.48 cm     Systemic VTI:  0.20 m MV Peak grad:  5.6 mmHg     Systemic Diam: 1.80 cm MV Mean grad:  2.0 mmHg MV Vmax:       1.18 m/s MV Vmean:      69.7 cm/s MV Decel Time: 296 msec MV E velocity: 92.20 cm/s MV A velocity: 113.00 cm/s MV E/A ratio:  0.82 Jenkins Rouge MD Electronically signed by Jenkins Rouge MD Signature Date/Time: 11/04/2022/12:40:21 PM    Final    Medications:  anticoagulant sodium citrate      atorvastatin  40 mg Oral Daily   buprenorphine  1 patch Transdermal Weekly   calcitRIOL  1.25 mcg Oral Q M,W,F-HD   camphor-menthol  1 Application Topical Daily   carbidopa-levodopa  2 tablet Oral q AM   And   carbidopa-levodopa  1 tablet Oral BID   Chlorhexidine Gluconate Cloth  6 each Topical Q0600   darbepoetin (ARANESP) injection - DIALYSIS  200 mcg Subcutaneous Q Fri-1800   feeding supplement (NEPRO CARB STEADY)  237 mL Oral TID BM   melatonin  5 mg Oral QHS   midodrine  10 mg Oral TID WC   mirtazapine  15 mg Oral QHS   Muscle Rub   Topical TID   nystatin  1 Application Topical Daily   pantoprazole (PROTONIX) IV  40 mg Intravenous Q12H   phenytoin (DILANTIN) IV  50 mg Intravenous Q8H   rOPINIRole  12 mg Oral Daily   sodium chloride  flush  3 mL Intravenous Q12H     Dialysis Orders: [facility] MWF 3:15h  67.9 kg 3K/2Ca  AVF  Hep 3000 units IV TIW Mircera 291mg IV q 2 weeks - last given 10/09/22 Venofer 50 mg IV qwk   Assessment/Plan: Myoclonus/ jerking: initially thought possibly gabapentin toxicity- CT head negative.  Electrolytes OK.  Stopped gabapentin. Now wondering if all related to #2 MRI  - no acute intracranial findings, incomplete image mass/lesion in left neck. EEG w/mild-mod encephalopathy, non specific etiology. No seizure activity noted.  Seizure:  First seizure 1/16, endo cancelled, loaded with Keppra, neuro following.  Another seizure on dialysis 1/17.  Stopped with 2 mg IV ativan.  EEG negative. Per neuro seizure likely d/t underlying stroke. Had more breakthrough seizures this week but did not tolerate. Neurology following, has not tolerated new AED, now on dilantin.    Lack of IV access: Discussed with Dr. SJonnie Finnerand Dr. EBonney Aid Will proceed with PICC despite being  ESRD pt.  Diverticulitis of L colon - seen on CT.  For EGD and colonoscopy once stable.  Per GI ESRD: MWF [facility]. Holding HD orders D/T pt's inability to tolerate HD. Patient now comfort care.  Hypotension: Syncopal epidisode/symptomatic hypotension post HD 11/03/2022. Improved after blood transfusion. Avoid hypotension. Unable to complete HD 11/05/2022 D/T hypotension. Anemia of ESRD: HGB 8 range. Now S/P 3 units PRBCS 01/25-01/26/2024. 4 units PRBC since admission. Follow HGB continue  Aranesp 285mg q Fri Hold heparin.  Metabolic Bone Disease - CCa, phos, Mg in goal.  Continue calcitriol. Nutrition - NPO at present.  Nausea - on antiemetics.  GOC-patient is a DNR. No children. Family has met with Palliative Care, appreciate recommendations. Patient has not tolerated HD all week. Hypotension, seizures, hypotension again 11/05/2022. Concerned that the constellation of symptoms that we have seen this week are a result of patient approaching end of life  and no longer tolerating HD. Will NOT order HD for tomorrow. Labs are acceptable today. Palliative Care meeting with family. Patient will transition to comfort care.   Rita H. Brown NP-C 11/06/2022, 9:09 AM  [facility] [phone] Pt seen, examined and agree w A/P as above. Pt too unstable for further dialysis, extremely debilitated and unable to tolerate the HD procedure anymore. We have explained to family and the patient in our palliative care meeting this morning. See pall care note for further details.  RKelly Splinter MD 11/06/2022, 12:33 PM

## 2022-11-06 NOTE — Progress Notes (Signed)
Subjective: Resting comfortably in bed. LTM remains connected. She tells me that she slept through the night but she has been belching frequently.   Objective: Current vital signs: BP (!) 112/42 (BP Location: Right Leg)   Pulse 66   Temp 98.7 F (37.1 C) (Axillary)   Resp 20   Ht 5' (1.524 m)   Wt 65.7 kg   SpO2 95%   BMI 28.29 kg/m  Vital signs in last 24 hours: Temp:  [97.7 F (36.5 C)-98.7 F (37.1 C)] 98.7 F (37.1 C) (01/28 0813) Pulse Rate:  [63-72] 66 (01/28 0813) Resp:  [10-20] 20 (01/28 0813) BP: (99-131)/(36-57) 112/42 (01/28 0813) SpO2:  [94 %-100 %] 95 % (01/28 0813) Weight:  [65.7 kg] 65.7 kg (01/27 1400)  Intake/Output from previous day: 01/27 0701 - 01/28 0700 In: -  Out: 1 [Emesis/NG output:1] Intake/Output this shift: No intake/output data recorded. Nutritional status:  Diet Order             Diet renal with fluid restriction Fluid restriction: 1200 mL Fluid; Room service appropriate? Yes; Fluid consistency: Thin  Diet effective now                  HEENT: EEG leads are in place. Decreased hydration of oral mucosa Lungs: Respirations unlabored Ext: Warm and well perfused  Neurologic Exam: Ment: Awake with decreased level of alertness. Oriented to the month, but not the day of the week or the year. Oriented to the city and the state but cannot remember the name of the hospital. Speech is sparse but fluent. Comprehension is intact for basic questions and commands. Affect dysthymic.  CN: PERRL. EOMI. Face symmetric. Phonation intact.  Motor: 4/5 BUE. 4-/5 BLE Sensory: Intact to gross touch x 4 Reflexes: Unable to elicit patellars due to prior knee operations Cerebellar: No ataxia noted.  Gait: Deferred  Lab Results: Results for orders placed or performed during the hospital encounter of 10/24/22 (from the past 48 hour(s))  Glucose, capillary     Status: None   Collection Time: 11/04/22 11:44 AM  Result Value Ref Range   Glucose-Capillary 72  70 - 99 mg/dL    Comment: Glucose reference range applies only to samples taken after fasting for at least 8 hours.  Hemoglobin and hematocrit, blood     Status: Abnormal   Collection Time: 11/04/22  2:28 PM  Result Value Ref Range   Hemoglobin 7.5 (L) 12.0 - 15.0 g/dL   HCT 23.3 (L) 36.0 - 46.0 %    Comment: Performed at [facility], [place] [address], [place], [place] [address]  Glucose, capillary     Status: None   Collection Time: 11/04/22  3:55 PM  Result Value Ref Range   Glucose-Capillary 79 70 - 99 mg/dL    Comment: Glucose reference range applies only to samples taken after fasting for at least 8 hours.  Prepare RBC (crossmatch)     Status: None   Collection Time: 11/04/22  5:30 PM  Result Value Ref Range   Order Confirmation      ORDER PROCESSED BY BLOOD BANK Performed at [facility], [place] [address] [place], [place] [address]   Glucose, capillary     Status: None   Collection Time: 11/04/22  9:25 PM  Result Value Ref Range   Glucose-Capillary 74 70 - 99 mg/dL    Comment: Glucose reference range applies only to samples taken after fasting for at least 8 hours.  Renal function panel  Status: Abnormal   Collection Time: 11/05/22  3:04 AM  Result Value Ref Range   Sodium 135 135 - 145 mmol/L   Potassium 4.0 3.5 - 5.1 mmol/L   Chloride 101 98 - 111 mmol/L   CO2 23 22 - 32 mmol/L   Glucose, Bld 75 70 - 99 mg/dL    Comment: Glucose reference range applies only to samples taken after fasting for at least 8 hours.   BUN 41 (H) 8 - 23 mg/dL   Creatinine, Ser 4.14 (H) 0.44 - 1.00 mg/dL   Calcium 7.3 (L) 8.9 - 10.3 mg/dL   Phosphorus 4.5 2.5 - 4.6 mg/dL   Albumin 2.3 (L) 3.5 - 5.0 g/dL   GFR, Estimated 11 (L) >60 mL/min    Comment: (NOTE) Calculated using the CKD-EPI Creatinine Equation (2021)    Anion gap 11 5 - 15    Comment: Performed at [facility] [address], [place], [place] [address]  CBC     Status: Abnormal   Collection Time: 11/05/22   3:04 AM  Result Value Ref Range   WBC 4.6 4.0 - 10.5 K/uL   RBC 3.22 (L) 3.87 - 5.11 MIL/uL   Hemoglobin 9.0 (L) 12.0 - 15.0 g/dL   HCT 29.0 (L) 36.0 - 46.0 %   MCV 90.1 80.0 - 100.0 fL   MCH 28.0 26.0 - 34.0 pg   MCHC 31.0 30.0 - 36.0 g/dL   RDW 17.3 (H) 11.5 - 15.5 %   Platelets 56 (L) 150 - 400 K/uL    Comment: Immature Platelet Fraction may be clinically indicated, consider ordering this additional test [id number] REPEATED TO VERIFY    nRBC 0.0 0.0 - 0.2 %    Comment: Performed at [facility], [place] [address] [place], [place] [address]  Glucose, capillary     Status: None   Collection Time: 11/05/22  5:13 AM  Result Value Ref Range   Glucose-Capillary 72 70 - 99 mg/dL    Comment: Glucose reference range applies only to samples taken after fasting for at least 8 hours.  Glucose, capillary     Status: None   Collection Time: 11/05/22  8:44 AM  Result Value Ref Range   Glucose-Capillary 71 70 - 99 mg/dL    Comment: Glucose reference range applies only to samples taken after fasting for at least 8 hours.  Glucose, capillary     Status: Abnormal   Collection Time: 11/05/22  5:16 PM  Result Value Ref Range   Glucose-Capillary 48 (L) 70 - 99 mg/dL    Comment: Glucose reference range applies only to samples taken after fasting for at least 8 hours.  Glucose, capillary     Status: Abnormal   Collection Time: 11/05/22  5:46 PM  Result Value Ref Range   Glucose-Capillary 59 (L) 70 - 99 mg/dL    Comment: Glucose reference range applies only to samples taken after fasting for at least 8 hours.  Glucose, capillary     Status: Abnormal   Collection Time: 11/05/22  6:05 PM  Result Value Ref Range   Glucose-Capillary 66 (L) 70 - 99 mg/dL    Comment: Glucose reference range applies only to samples taken after fasting for at least 8 hours.  Glucose, capillary     Status: None   Collection Time: 11/05/22  6:34 PM  Result Value Ref Range   Glucose-Capillary 88 70 - 99 mg/dL     Comment: Glucose reference range applies only to  samples taken after fasting for at least 8 hours.  Glucose, capillary     Status: None   Collection Time: 11/05/22 10:21 PM  Result Value Ref Range   Glucose-Capillary 96 70 - 99 mg/dL    Comment: Glucose reference range applies only to samples taken after fasting for at least 8 hours.  Renal function panel     Status: Abnormal   Collection Time: 11/06/22  4:42 AM  Result Value Ref Range   Sodium 136 135 - 145 mmol/L   Potassium 3.7 3.5 - 5.1 mmol/L   Chloride 102 98 - 111 mmol/L   CO2 26 22 - 32 mmol/L   Glucose, Bld 72 70 - 99 mg/dL    Comment: Glucose reference range applies only to samples taken after fasting for at least 8 hours.   BUN 35 (H) 8 - 23 mg/dL   Creatinine, Ser 3.30 (H) 0.44 - 1.00 mg/dL   Calcium 6.9 (L) 8.9 - 10.3 mg/dL   Phosphorus 3.5 2.5 - 4.6 mg/dL   Albumin 2.0 (L) 3.5 - 5.0 g/dL   GFR, Estimated 14 (L) >60 mL/min    Comment: (NOTE) Calculated using the CKD-EPI Creatinine Equation (2021)    Anion gap 8 5 - 15    Comment: Performed at [facility] [address], [place], [place] [address]  CBC     Status: Abnormal   Collection Time: 11/06/22  4:42 AM  Result Value Ref Range   WBC 4.1 4.0 - 10.5 K/uL   RBC 3.07 (L) 3.87 - 5.11 MIL/uL   Hemoglobin 8.5 (L) 12.0 - 15.0 g/dL   HCT 27.4 (L) 36.0 - 46.0 %   MCV 89.3 80.0 - 100.0 fL   MCH 27.7 26.0 - 34.0 pg   MCHC 31.0 30.0 - 36.0 g/dL   RDW 17.6 (H) 11.5 - 15.5 %   Platelets 48 (L) 150 - 400 K/uL    Comment: Immature Platelet Fraction may be clinically indicated, consider ordering this additional test [id number] REPEATED TO VERIFY    nRBC 0.0 0.0 - 0.2 %    Comment: Performed at [facility], [place] [address], [place], [place] [address]  Glucose, capillary     Status: None   Collection Time: 11/06/22  8:17 AM  Result Value Ref Range   Glucose-Capillary 70 70 - 99 mg/dL    Comment: Glucose reference range applies only to samples taken after  fasting for at least 8 hours.    No results found for this or any previous visit (from the past 240 hour(s)).  Lipid Panel No results for input(s): "CHOL", "TRIG", "HDL", "CHOLHDL", "VLDL", "LDLCALC" in the last 72 hours.  Studies/Results: [place] EKG SITE RITE  Result Date: 11/05/2022 If Site Rite image not attached, placement could not be confirmed due to current cardiac rhythm.  CT ABDOMEN PELVIS WO CONTRAST  Result Date: 11/04/2022 CLINICAL DATA:  Anemia EXAM: CT ABDOMEN AND PELVIS WITHOUT CONTRAST TECHNIQUE: Multidetector CT imaging of the abdomen and pelvis was performed following the standard protocol without IV contrast. RADIATION DOSE REDUCTION: This exam was performed according to the departmental dose-optimization program which includes automated exposure control, adjustment of the mA and/or kV according to patient size and/or use of iterative reconstruction technique. COMPARISON:  10/20/2022 FINDINGS: Lower chest: Bilateral pleural effusions are noted slightly larger on the right than the left. These are increased from the prior exam. Lower lobe consolidation is noted also right greater than left. Hepatobiliary: No focal liver abnormality  is seen. No gallstones, gallbladder wall thickening, or biliary dilatation. Pancreas: Unremarkable. No pancreatic ductal dilatation or surrounding inflammatory changes. Spleen: Normal in size without focal abnormality. Adrenals/Urinary Tract: Adrenal glands are stable from the prior exam. Again a small 11 mm nodule is noted within the left adrenal gland consistent with small adenoma unchanged from 20/10. Kidneys are well visualized bilaterally with diffuse cortical thinning. Cystic changes are noted similar to that seen on the prior exam. No obstructive changes are seen. No renal calculi are noted. The bladder is partially distended. Stomach/Bowel: There is again noted wall thickening in the sigmoid colon related to diverticular change. The degree of  inflammatory change has improved in the interval from the prior study. No abscess is identified. More proximal colon is within normal limits with the exception of diverticular change. Stomach is within normal limits. No obstructive changes in the small bowel are noted. Vascular/Lymphatic: Aortic atherosclerosis. No enlarged abdominal or pelvic lymph nodes. Reproductive: Uterus is within normal limits. Calcifications in the right ovary are again noted and stable. A simple appearing left ovarian cyst is noted measuring 3.6 cm. This is stable from the prior exam Other: No abdominal wall hernia or abnormality. No abdominopelvic ascites. Musculoskeletal: No acute or significant osseous findings. Old rib fractures are noted on the left and stable. IMPRESSION: Persistent changes of previous diverticulitis with persistent wall thickening. The degree of inflammatory changes have improved in the interval from the prior exam. 3.6 cm left simple appearing ovarian cyst. Recommend follow-up US in 6-12 months. Note: This recommendation does not apply to premenarchal patients and to those with increased risk (genetic, family history, elevated tumor markers or other high-risk factors) of ovarian cancer. Reference: JACR 2020 Feb; 17(2):248-254 Stable left adrenal adenoma. No follow-up is recommended as it is been stable for over 13 years. Small effusions and lower lobe consolidation increased in the interval from the prior exam. Exophytic cyst in the right kidney similar to that seen on the prior exam. Again nonemergent outpatient MRI is recommended for further evaluation. Electronically Signed   By: Inez Catalina M.D.   On: 11/04/2022 17:14   ECHOCARDIOGRAM COMPLETE  Result Date: 11/04/2022    ECHOCARDIOGRAM REPORT   Patient Name:   Jean Mcclain Date of Exam: 11/04/2022 Medical Rec #:  [id number]        Height:       60.0 in Accession #:    [id number]       Weight:       151.0 lb Date of Birth:  06-15-1946         BSA:           1.657 m Patient Age:    31 years         BP:           101/54 mmHg Patient Gender: F                HR:           70 bpm. Exam Location:  Inpatient Procedure: 2D Echo Indications:    syncope  History:        Patient has prior history of Echocardiogram examinations, most                 recent 07/22/2022. End stage renal disease. Parkinson's                 disease.; Risk Factors:Sleep Apnea and Diabetes.  Sonographer:    [place] Referring Phys:  [place]  1. Left ventricular ejection fraction, by estimation, is 60 to 65%. The left ventricle has normal function. The left ventricle has no regional wall motion abnormalities. There is mild left ventricular hypertrophy. Left ventricular diastolic parameters are consistent with Grade I diastolic dysfunction (impaired relaxation).  2. Right ventricular systolic function is normal. The right ventricular size is normal.  3. Left atrial size was mildly dilated.  4. The mitral valve is abnormal. Trivial mitral valve regurgitation. No evidence of mitral stenosis. Severe mitral annular calcification.  5. The aortic valve is tricuspid. There is moderate calcification of the aortic valve. There is moderate thickening of the aortic valve. Aortic valve regurgitation is not visualized. Aortic valve sclerosis/calcification is present, without any evidence of aortic stenosis.  6. The inferior vena cava is normal in size with greater than 50% respiratory variability, suggesting right atrial pressure of 3 mmHg. FINDINGS  Left Ventricle: Left ventricular ejection fraction, by estimation, is 60 to 65%. The left ventricle has normal function. The left ventricle has no regional wall motion abnormalities. The left ventricular internal cavity size was normal in size. There is  mild left ventricular hypertrophy. Left ventricular diastolic parameters are consistent with Grade I diastolic dysfunction (impaired relaxation). Right Ventricle: The right  ventricular size is normal. No increase in right ventricular wall thickness. Right ventricular systolic function is normal. Left Atrium: Left atrial size was mildly dilated. Right Atrium: Right atrial size was normal in size. Pericardium: There is no evidence of pericardial effusion. Mitral Valve: The mitral valve is abnormal. There is mild thickening of the mitral valve leaflet(s). There is mild calcification of the mitral valve leaflet(s). Severe mitral annular calcification. Trivial mitral valve regurgitation. No evidence of mitral valve stenosis. MV peak gradient, 5.6 mmHg. The mean mitral valve gradient is 2.0 mmHg. Tricuspid Valve: The tricuspid valve is normal in structure. Tricuspid valve regurgitation is mild . No evidence of tricuspid stenosis. Aortic Valve: The aortic valve is tricuspid. There is moderate calcification of the aortic valve. There is moderate thickening of the aortic valve. Aortic valve regurgitation is not visualized. Aortic valve sclerosis/calcification is present, without any  evidence of aortic stenosis. Pulmonic Valve: The pulmonic valve was normal in structure. Pulmonic valve regurgitation is trivial. No evidence of pulmonic stenosis. Aorta: The aortic root is normal in size and structure. Venous: The inferior vena cava is normal in size with greater than 50% respiratory variability, suggesting right atrial pressure of 3 mmHg. IAS/Shunts: No atrial level shunt detected by color flow Doppler.  LEFT VENTRICLE PLAX 2D LVIDd:         3.90 cm   Diastology LVIDs:         2.20 cm   LV e' medial:    7.30 cm/s LV PW:         1.00 cm   LV E/e' medial:  12.6 LV IVS:        1.20 cm   LV e' lateral:   9.17 cm/s LVOT diam:     1.80 cm   LV E/e' lateral: 10.1 LV SV:         50 LV SV Index:   30 LVOT Area:     2.54 cm  RIGHT VENTRICLE             IVC RV Basal diam:  2.60 cm     IVC diam: 1.10 cm RV S prime:     10.40 cm/s TAPSE (M-mode): 2.2 cm LEFT ATRIUM  Index        RIGHT ATRIUM            Index LA diam:        3.80 cm 2.29 cm/m   RA Area:     12.80 cm LA Vol (A2C):   68.2 ml 41.17 ml/m  RA Volume:   28.60 ml  17.27 ml/m LA Vol (A4C):   60.2 ml 36.34 ml/m LA Biplane Vol: 66.6 ml 40.20 ml/m  AORTIC VALVE LVOT Vmax:   89.60 cm/s LVOT Vmean:  58.400 cm/s LVOT VTI:    0.198 m  AORTA Ao Root diam: 2.90 cm Ao Asc diam:  3.20 cm MITRAL VALVE MV Area (PHT): 2.56 cm     SHUNTS MV Area VTI:   1.48 cm     Systemic VTI:  0.20 m MV Peak grad:  5.6 mmHg     Systemic Diam: 1.80 cm MV Mean grad:  2.0 mmHg MV Vmax:       1.18 m/s MV Vmean:      69.7 cm/s MV Decel Time: 296 msec MV E velocity: 92.20 cm/s MV A velocity: 113.00 cm/s MV E/A ratio:  0.82 Jenkins Rouge MD Electronically signed by Jenkins Rouge MD Signature Date/Time: 11/04/2022/12:40:21 PM    Final     Medications: Scheduled:  atorvastatin  40 mg Oral Daily   buprenorphine  1 patch Transdermal Weekly   calcitRIOL  1.25 mcg Oral Q M,W,F-HD   camphor-menthol  1 Application Topical Daily   carbidopa-levodopa  2 tablet Oral q AM   And   carbidopa-levodopa  1 tablet Oral BID   Chlorhexidine Gluconate Cloth  6 each Topical Q0600   darbepoetin (ARANESP) injection - DIALYSIS  200 mcg Subcutaneous Q Fri-1800   feeding supplement (NEPRO CARB STEADY)  237 mL Oral TID BM   melatonin  5 mg Oral QHS   midodrine  10 mg Oral TID WC   mirtazapine  15 mg Oral QHS   Muscle Rub   Topical TID   nystatin  1 Application Topical Daily   pantoprazole (PROTONIX) IV  40 mg Intravenous Q12H   phenytoin (DILANTIN) IV  50 mg Intravenous Q8H   rOPINIRole  12 mg Oral Daily   sodium chloride flush  3 mL Intravenous Q12H   Continuous:  anticoagulant sodium citrate      Assessment: 77 year old wheelchair-bound female with history of hypertension, hyperlipidemia, stroke, diabetes, end-stage renal disease on hemodialysis, Parkinsonism, RLS and myoclonus who has been having multiple seizure-like episodes. - Exam today reveals an awake patient with decreased  level of alertness who follows all commands without asymmetry on motor exam.  - Seizures are currently managed with Dilantin. Keppra was initially started which had to be stopped due to thrombocytopenia.  Also stopped Depakote as patient was having unclear source of bleeding and already was thrombocytopenic. Could not tolerate Vimpat due to bradycardia - LTM EEG report for this morning (Sunday): Continuous slow, generalized. This study is suggestive of moderate diffuse encephalopathy, nonspecific to etiology.  No epileptiform discharges were seen throughout the recording.  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 Her acute encephalopathy is now resolved. Of note, she did have an episode earlier this week that looked more typical of myoclonic seizures which can happen in patients with ESRD.   Recommendations: - Family has chosen to elect for comfort care.  Palliative care team is involved. LTM d/c'd and neurology will sign off.    LOS: 12 days   Janine Ores, [place], FNP-BC [facility] Pager: [phone]  Electronically signed: Dr. Kerney Elbe

## 2022-11-07 DIAGNOSIS — N186 End stage renal disease: Secondary | ICD-10-CM | POA: Diagnosis not present

## 2022-11-07 DIAGNOSIS — D649 Anemia, unspecified: Secondary | ICD-10-CM | POA: Diagnosis not present

## 2022-11-07 DIAGNOSIS — Z992 Dependence on renal dialysis: Secondary | ICD-10-CM | POA: Diagnosis not present

## 2022-11-07 DIAGNOSIS — R4182 Altered mental status, unspecified: Secondary | ICD-10-CM | POA: Diagnosis not present

## 2022-11-07 LAB — GLUCOSE, CAPILLARY: Glucose-Capillary: 77 mg/dL (ref 70–99)

## 2022-11-07 MED ORDER — PROMETHAZINE HCL 25 MG RE SUPP: 25.0000 mg | Freq: Four times a day (QID) | RECTAL | Status: DC | PRN

## 2022-11-07 MED ORDER — SODIUM CHLORIDE 0.9% FLUSH
10.0000 mL | INTRAVENOUS | Status: DC | PRN
  Administered 2022-11-07: 20 mL

## 2022-11-07 MED ORDER — SODIUM CHLORIDE 0.9 % IV SOLN: 12.5000 mg | Freq: Four times a day (QID) | INTRAVENOUS | Status: DC | PRN

## 2022-11-07 NOTE — Progress Notes (Signed)
PROGRESS NOTE    Jean Mcclain  QQV:[id number] DOB: [birth year] DOA: 10/24/2022 PCP: Pcp, No   Chief Complaint  Patient presents with   Shaking    Brief Narrative:    Jean Mcclain is a 77 y.o. female with medical history significant of hypertension, hyperlipidemia, diabetes mellitus type II, ESRD on HD(M/W/F), chronic respiratory failure with hypoxia on 3 L of oxygen at night, Parkinson's disease, history of DVT not on anticoagulation, and wheelchair-bound due to left ankle fracture who presents for worsening shaking and jerking found to have seizures.  She was seen by neurology for evaluation for new onset seizures, which was felt  provoked by diagnosis of CVA (not acute), CVA happened over the last few month, as it was not present on MRI on 07/2022 , multiple seizures significant, for which she has been followed closely by neurology, where she was kept in LTM EEG when her medication has been adjusted , patient with significant failure to thrive, protein calorie malnutrition, in the setting of poor oral intake over the last few month secondary to persistent nausea and vomiting , she has been treated for diverticulitis last year, plan for colonoscopy as an outpatient as she never had colonoscopy in the past, it was a concern of chronic blood loss anemia from GI source, GI were consulted, plan was for endoscopy and at 1 point colonoscopy, but unfortunately she was unstable for procedure, where it had to be canceled twice for seizures, and low blood pressure, she did have anemia of acute blood loss, source most likely significant left shoulder/chest bruising/hematoma, likely in the setting of progressive thrombocytopenia, for which she required multiple PRBC transfusion, patient is extremely frail, deconditioned, malnourished, with very poor baseline, where she could not tolerate HD, due to very soft blood pressure, and frailty, her oral intake was very poor as well during hospital stay, and from  before due to her persistent nausea and vomiting.  Patient is approaching end-of-life, and she is unable to tolerate her hemodialysis, palliative medicine has been consulted, discussed with family and patient , transitioned to comfort care on 11/06/2022  Assessment & Plan:   Principal Problem:   Symptomatic anemia Active Problems:   Jerking   Acute diverticulitis   Acute on chronic respiratory failure with hypoxia (HCC)   Nausea and vomiting   ESRD on hemodialysis ([place])   Diabetes mellitus due to underlying condition, controlled, with stage 2 chronic kidney disease, with long-term current use of insulin (HCC)   Thrombocytopenia (HCC)   History of DVT (deep vein thrombosis)   Parkinson's disease   Abnormal TSH   Kidney lesion   ESRD on dialysis ([place])   Abnormal CT scan, gastrointestinal tract   Abnormal movements   Anemia   Seizure disorder (HCC)   Altered mental status      Symptomatic acute on chronic blood loss anemia.   Anemia of chronic kidney disease Nausea and vomitin - Possible occult GI blood loss with recent history of diverticulitis and pending outpatient colonoscopy.   -GI has been consulted, recommendation has been for endoscopy, endoscopy, endoscopy has not been scheduled twice, and had to be canceled given patient medical instability, and recurrent seizures, and hypertension, unfortunately she remains unstable. -Quired multiple units of PRBC transfusion during hospital stay -Aranesp and IV iron per renal -She is with significant bruising in the left upper chest, shoulder and breast area, this would largely contribute to her acute blood loss anemia, was managed with supportive transfusion  -He had multiple  imaging over the last few month showing persistent sigmoid diverticulitis, descending colon without complicating features, she was treated with antibiotics for this in the past, she had no pain in that area, she had no fever, no leukocytosis, she never had  colonoscopy regarding that, and was for colonoscopy, but as mentioned above she is medically unstable for that.  New onset seizures -Was followed closely by neurology, and kept on LTM EEG where her medication has been adjusted, and she was on IV Ativan as needed  ESRD on HD.  -Management per renal, she was unable to tolerate her HD, had to be stopped prematurely in the setting of instability, low blood pressure and hypotension and clinical instability.   Incidental Neck Mass  Noted on MRI. CT soft tissue neck shows mass that is most likely benign and most consistent with dermal inclusion cyst.    Acute on chronic hypoxic respiratory failure with underlying OSA.  Resolved. At home on 3 L and here on 2-3 L of oxygen.  Likely increased initially due to low hgb.    History of DVT in 2018.  Not on anticoagulation anymore, repeat lower extremity venous ultrasound ordered during admission remains negative.   History of RLS .  Continue home regimen.   History of sick euthyroid syndrome.  Stable TSH now.   persistent thrombocytopenia.  Severe PCM FTT Chronic debility -With albumin of 2,  She is with very poor oral intake over last few months secondary to persistent nausea and vomiting.  Kidney lesion  - Noted to be increased in size on CT scan from 07/2022 for which MRI of the abdomen recommended in 6 months at that time   DM type II.  A1c is 6.9 in 07/2022 .   Lack of IV access -Required PICC line  Goals of care discussion: With family multiple times, palliative medicine assistance greatly appreciated, her prognosis remains very poor, very poor baseline, malnutrition, chronic debility, severe deconditioning and failure to thrive, this has progressed to the point where she cannot tolerate dialysis at this point, and she is with very poor oral intake, she has been transitioned to full comfort measure.   DVT prophylaxis:SCD Code Status: DNR/comfort Disposition:   Status is:  Inpatient    Consultants:  GI Renal Neurology Palliative   Subjective: He has been transitioned to comfort care yesterday, she is complaining of tailbone pain, I did reposition her with the CNA, requested some pain meds for her by staff. Objective: Vitals:   11/06/22 0813 11/06/22 2027 11/07/22 0700 11/07/22 0800  BP: (!) 112/42 (!) 102/49    Pulse: 66 60 (!) 59 63  Resp: '20 19 10 19  '$ Temp: 98.7 F (37.1 C) 98.1 F (36.7 C)  98.2 F (36.8 C)  TempSrc: Axillary Axillary  Axillary  SpO2: 95% 95% 96% 93%  Weight:      Height:       No intake or output data in the 24 hours ending 11/07/22 1200  Filed Weights   11/03/22 1600 11/04/22 0411 11/05/22 1400  Weight: 62.9 kg 68.5 kg 65.7 kg    Examination:  She is awake, appropriate, extremely frail, deconditioned, chroniclly  ill-appearing Fair air entry bilaterally anteriorly Regular rate and rhythm, no rubs or gallops Abdomen soft, nontender, bowel sounds present Extremities with no clubbing and cyanosis Significant bruising in left shoulder, left upper chest and breast area        Data Reviewed: I have personally reviewed following labs and imaging studies  CBC: Recent  Labs  Lab 11/01/22 0705 11/03/22 0457 11/04/22 0051 11/04/22 0729 11/04/22 1428 11/05/22 0304 11/06/22 0442  WBC 5.2 6.1 5.4  --   --  4.6 4.1  HGB 8.1* 7.7* 7.4* 8.0* 7.5* 9.0* 8.5*  HCT 26.5* 25.2* 24.8* 25.0* 23.3* 29.0* 27.4*  MCV 90.4 88.7 89.9  --   --  90.1 89.3  PLT 78* 71* 65*  --   --  56* 48*    Basic Metabolic Panel: Recent Labs  Lab 11/02/22 0757 11/03/22 0457 11/04/22 0051 11/05/22 0304 11/06/22 0442  NA 136 134* 136 135 136  K 3.8 4.1 4.1 4.0 3.7  CL 98 96* 101 101 102  CO2 '28 25 26 23 26  '$ GLUCOSE 78 80 102* 75 72  BUN 21 32* 30* 41* 35*  CREATININE 2.95* 3.62* 3.30* 4.14* 3.30*  CALCIUM 7.5* 7.5* 7.5* 7.3* 6.9*  PHOS 2.9 3.6 3.9 4.5 3.5    GFR: Estimated Creatinine Clearance: 12.3 mL/min (A) (by C-G  formula based on SCr of 3.3 mg/dL (H)).  Liver Function Tests: Recent Labs  Lab 11/02/22 0757 11/03/22 0457 11/04/22 0051 11/05/22 0304 11/06/22 0442  ALBUMIN 2.4* 2.4* 2.3* 2.3* 2.0*    CBG: Recent Labs  Lab 11/05/22 1805 11/05/22 1834 11/05/22 2221 11/06/22 0817 11/07/22 0806  GLUCAP 66* 88 96 70 77     No results found for this or any previous visit (from the past 240 hour(s)).       Radiology Studies: [place] EKG SITE RITE  Result Date: 11/05/2022 If Site Rite image not attached, placement could not be confirmed due to current cardiac rhythm.       Scheduled Meds:  buprenorphine  1 patch Transdermal Weekly   camphor-menthol  1 Application Topical Daily   carbidopa-levodopa  2 tablet Oral q AM   And   carbidopa-levodopa  1 tablet Oral BID   feeding supplement (NEPRO CARB STEADY)  237 mL Oral TID BM    HYDROmorphone (DILAUDID) injection  0.5 mg Intravenous Q8H   melatonin  5 mg Oral QHS   Muscle Rub   Topical TID   phenytoin (DILANTIN) IV  50 mg Intravenous Q8H   rOPINIRole  12 mg Oral Daily   Continuous Infusions:  promethazine (PHENERGAN) injection (IM or IVPB)        LOS: 13 days        Phillips Climes, MD [facility]   To contact the attending provider between 7A-7P or the covering provider during after hours 7P-7A, please log into the web site [email or web address] and access using universal [place] password for that web site. If you do not have the password, please call the hospital operator.  11/07/2022, 12:00 PM

## 2022-11-07 NOTE — TOC Progression Note (Signed)
Transition of Care [facility]) - Progression Note    Patient Details  Name: Jean Mcclain MRN: [id number] Date of Birth: 08/01/1946  Transition [facility]) CM/SW [place], LCSW Phone Number: 11/07/2022, 1:17 PM  Clinical Narrative:    CSW spoke with patient's brother. He reported he and his wife are on their way to tour the [facility] facility and they will let CSW know their decision after that.    Expected Discharge Plan: [place] Barriers to Discharge: Hospice Bed not available  Expected Discharge Plan and Services In-house Referral: Clinical Social Work   Post Acute Care Choice: [place] arrangements for the past 2 months: [place]                                       Social Determinants of Health (SDOH) Interventions SDOH Screenings   Food Insecurity: No Food Insecurity (10/25/2022)  Housing: Low Risk  (10/25/2022)  Transportation Needs: No Transportation Needs (10/25/2022)  Utilities: Not At Risk (10/25/2022)  Depression (PHQ2-9): Low Risk  (12/06/2019)  Tobacco Use: Medium Risk (11/06/2022)    Readmission Risk Interventions    10/31/2022    9:27 AM  Readmission Risk Prevention Plan  Transportation Screening Complete  Medication Review ([place]) Complete  PCP or Specialist appointment within 3-5 days of discharge Complete  HRI or [place] Complete  SW Recovery Care/Counseling Consult Complete  [place] Not Applicable

## 2022-11-07 NOTE — Progress Notes (Signed)
Bedside symptom check.  Patient in bed, RN just gave pain medication.  Patient is quiet, some movements in bed, no verbalizing, appears comfortable.  Family at bedside.  They will be touring [facility] in [place] this afternoon for possible transfer today or early tomorrow.  Provided support, answered questions.    Kizzie Fantasia, MSN, RN-BC, [facility], HEC-C Palliative Clinical Specialist [facility]

## 2022-11-07 NOTE — Plan of Care (Signed)
  Problem: Education: Goal: Ability to describe self-care measures that may prevent or decrease complications (Diabetes Survival Skills Education) will improve Outcome: Progressing Goal: Individualized Educational Video(s) Outcome: Progressing   Problem: Coping: Goal: Ability to adjust to condition or change in health will improve Outcome: Progressing   Problem: Fluid Volume: Goal: Ability to maintain a balanced intake and output will improve Outcome: Progressing   Problem: Health Behavior/Discharge Planning: Goal: Ability to identify and utilize available resources and services will improve Outcome: Progressing Goal: Ability to manage health-related needs will improve Outcome: Progressing   Problem: Metabolic: Goal: Ability to maintain appropriate glucose levels will improve Outcome: Progressing   Problem: Nutritional: Goal: Maintenance of adequate nutrition will improve Outcome: Progressing Goal: Progress toward achieving an optimal weight will improve Outcome: Progressing   Problem: Skin Integrity: Goal: Risk for impaired skin integrity will decrease Outcome: Progressing   Problem: Tissue Perfusion: Goal: Adequacy of tissue perfusion will improve Outcome: Progressing   Problem: Education: Goal: Knowledge of General Education information will improve Description: Including pain rating scale, medication(s)/side effects and non-pharmacologic comfort measures Outcome: Progressing   Problem: Health Behavior/Discharge Planning: Goal: Ability to manage health-related needs will improve Outcome: Progressing   Problem: Clinical Measurements: Goal: Ability to maintain clinical measurements within normal limits will improve Outcome: Progressing Goal: Will remain free from infection Outcome: Progressing Goal: Diagnostic test results will improve Outcome: Progressing Goal: Respiratory complications will improve Outcome: Progressing Goal: Cardiovascular complication will  be avoided Outcome: Progressing   Problem: Activity: Goal: Risk for activity intolerance will decrease Outcome: Progressing   Problem: Nutrition: Goal: Adequate nutrition will be maintained Outcome: Progressing   Problem: Coping: Goal: Level of anxiety will decrease Outcome: Progressing   Problem: Elimination: Goal: Will not experience complications related to bowel motility Outcome: Progressing Goal: Will not experience complications related to urinary retention Outcome: Progressing   Problem: Pain Managment: Goal: General experience of comfort will improve Outcome: Progressing   Problem: Safety: Goal: Ability to remain free from injury will improve Outcome: Progressing   Problem: Skin Integrity: Goal: Risk for impaired skin integrity will decrease Outcome: Progressing   Problem: Education: Goal: Knowledge of the prescribed therapeutic regimen will improve Outcome: Progressing   Problem: Coping: Goal: Ability to identify and develop effective coping behavior will improve Outcome: Progressing   Problem: Clinical Measurements: Goal: Quality of life will improve Outcome: Progressing   Problem: Respiratory: Goal: Verbalizations of increased ease of respirations will increase Outcome: Progressing   Problem: Role Relationship: Goal: Family's ability to cope with current situation will improve Outcome: Progressing Goal: Ability to verbalize concerns, feelings, and thoughts to partner or family member will improve Outcome: Progressing   Problem: Pain Management: Goal: Satisfaction with pain management regimen will improve Outcome: Progressing

## 2022-11-07 NOTE — Progress Notes (Signed)
[facility] notified that pt has transitioned to comfort care.   Melven Sartorius Renal Navigator [phone]

## 2022-11-07 NOTE — Procedures (Signed)
Patient Name: Jean Mcclain  MRN: [id number]  Epilepsy Attending: Lora Havens  Referring Physician/Provider: Lora Havens, MD  Duration: 11/06/2022 0915 to 11/07/2022 0915   Patient history: 77 year old female with seizure-like episodes.  EEG to evaluate for seizure.   Level of alertness: Awake, asleep   AEDs during EEG study: PHT   Technical aspects: This EEG study was done with scalp electrodes positioned according to the 10-20 International system of electrode placement. Electrical activity was reviewed with band pass filter of 1-'70Hz'$ , sensitivity of 7 uV/mm, display speed of 80m/sec with a '60Hz'$  notched filter applied as appropriate. EEG data were recorded continuously and digitally stored.  Video monitoring was available and reviewed as appropriate.   Description: The posterior dominant rhythm consists of '8Hz'$  activity of moderate voltage (25-35 uV) seen predominantly in posterior head regions, symmetric and reactive to eye opening and eye closing. Sleep was characterized by vertex waves, sleep spindles (12 to 14 Hz), maximal frontocentral region. EEG showed continuous generalized predominantly 5 to 7 Hz theta slowing admixed with 2 to 3 Hz generalized delta slowing. Hyperventilation and photic stimulation were not performed.      ABNORMALITY -Continuous slow, generalized   IMPRESSION: This study is suggestive of moderate diffuse encephalopathy, nonspecific etiology.  No epileptiform discharges were seen throughout the recording.   Kourtni Stineman [name]

## 2022-11-07 NOTE — Progress Notes (Signed)
Manufacturing engineer [facility]) Hospital Liaison Note   Family contacted [facility] main office inquiring of [place] transfer. MSW spoke to DIL/Lorraine and provided extensive education on Hospice criteria, benefit, etc. Edwena Felty voiced understanding. Edwena Felty requested to visit [place]. Upon visiting campus, family requested that [facility] does not begin evaluation process on Wednesday.   Family requested additional time to discuss options with patient and to reach out to additional family members to discuss patient decline. MSW voiced understanding. All questions answered and no concerns voiced.  At this time, patient is not under review for [facility] services as family continues to have ongoing [place] discussions. MSW to touch base with patient/family.   [facility] information and contact numbers given to family & above information shared with TOC.   Please call with any questions/concerns.    Thank you for the opportunity to participate in this patient's care.   Phillis Haggis, MSW [place] [facility] Liaison  [phone]

## 2022-11-07 NOTE — Progress Notes (Signed)
This chaplain responded to PMT NP-Michelle consult for EOL spiritual care. The Pt. is awake and accepting of the chaplain's bedside presence. The Pt. shares her tail bone pain is a "10". The Pt. RN-Penny was updated by the chaplain. The Pt. brother-Bernard and sister in law arrive at the end of the chaplain visit.  The chaplain listened reflectively as the Pt. shared her thoughts and choice of cremation. The chaplain understands local family is very important to the Pt. The Pt. is hopeful Patrick's children will visit.    Ilona Sorrel updated the chaplain on the Pt. request and his contact with [employer].  The Pt. updated the chaplain she doesn't have a relationship with a synagogue.  The Pt. is appreciative of the visit and open to F/U spiritual care as needed.  Chaplain Sallyanne Kuster [phone]

## 2022-11-07 NOTE — Progress Notes (Signed)
LTM EEG discontinued - no skin breakdown at unhook. [facility] notified 

## 2022-11-08 DIAGNOSIS — Z992 Dependence on renal dialysis: Secondary | ICD-10-CM | POA: Diagnosis not present

## 2022-11-08 DIAGNOSIS — N186 End stage renal disease: Secondary | ICD-10-CM | POA: Diagnosis not present

## 2022-11-08 DIAGNOSIS — D649 Anemia, unspecified: Secondary | ICD-10-CM | POA: Diagnosis not present

## 2022-11-08 DIAGNOSIS — G40909 Epilepsy, unspecified, not intractable, without status epilepticus: Secondary | ICD-10-CM | POA: Diagnosis not present

## 2022-11-08 LAB — PHENYTOIN LEVEL, FREE AND TOTAL
Phenytoin, Free: NOT DETECTED ug/mL (ref 1.0–2.0)
Phenytoin, Free: 0.7 ug/mL — ABNORMAL LOW (ref 1.0–2.0)
Phenytoin, Total: 2.7 ug/mL — ABNORMAL LOW (ref 10.0–20.0)
Phenytoin, Total: 3.4 ug/mL — ABNORMAL LOW (ref 10.0–20.0)

## 2022-11-08 LAB — PHENYTOIN LEVEL, TOTAL: Phenytoin Lvl: 4.9 ug/mL — ABNORMAL LOW (ref 10.0–20.0)

## 2022-11-08 MED ORDER — LORAZEPAM 2 MG/ML IJ SOLN
1.0000 mg | Freq: Four times a day (QID) | INTRAMUSCULAR | Status: DC
  Administered 2022-11-08 – 2022-11-09 (×4): 1 mg via INTRAVENOUS
  Filled 2022-11-08 (×5): qty 1

## 2022-11-08 NOTE — Progress Notes (Signed)
Subjective: Has had more seizures.  Family at bedside.  Per family he was communicating this morning prior to seizures-has since received Ativan and sleeping.  Patient is now hospice.  ROS: Unable to obtain due to poor mental status  Examination  Vital signs in last 24 hours: Temp:  [98.2 F (36.8 C)] 98.2 F (36.8 C) (01/29 1936) Pulse Rate:  [60-74] 64 (01/30 0800) Resp:  [9-20] 12 (01/30 0800) BP: (80-98)/(37-51) 90/37 (01/30 0800) SpO2:  [90 %-98 %] 94 % (01/30 0800)  General: lying in bed, NAD Neuro: Patient was asleep after receiving Ativan in the hospital so I did not wake up the patient.  Basic Metabolic Panel: Recent Labs  Lab 11/02/22 0757 11/03/22 0457 11/04/22 0051 11/05/22 0304 11/06/22 0442  NA 136 134* 136 135 136  K 3.8 4.1 4.1 4.0 3.7  CL 98 96* 101 101 102  CO2 '28 25 26 23 26  '$ GLUCOSE 78 80 102* 75 72  BUN 21 32* 30* 41* 35*  CREATININE 2.95* 3.62* 3.30* 4.14* 3.30*  CALCIUM 7.5* 7.5* 7.5* 7.3* 6.9*  PHOS 2.9 3.6 3.9 4.5 3.5    CBC: Recent Labs  Lab 11/03/22 0457 11/04/22 0051 11/04/22 0729 11/04/22 1428 11/05/22 0304 11/06/22 0442  WBC 6.1 5.4  --   --  4.6 4.1  HGB 7.7* 7.4* 8.0* 7.5* 9.0* 8.5*  HCT 25.2* 24.8* 25.0* 23.3* 29.0* 27.4*  MCV 88.7 89.9  --   --  90.1 89.3  PLT 71* 65*  --   --  56* 48*     Coagulation Studies: No results for input(s): "LABPROT", "INR" in the last 72 hours.  Imaging No new brain imaging overnight   ASSESSMENT AND PLAN: 77 year old wheelchair-bound female with history of hypertension, hyperlipidemia, diabetes, end-stage renal disease on hemodialysis parkinsonism, RLS and myoclonus who has been having multiple seizure-like episodes.   New onset focal epilepsy Chronic stroke, left parietal Acute encephalopathy, resolved Thrombocytopenia -Seizures likely due to underlying stroke, also had at least 1 myoclonic seizure which is sometimes seen in patients with end-stage renal  disease  Recommendations -Continue Dilantin 100 mg every 8 hours levels, levels ordered.  This can be increased if subtherapeutic and patient continues to have seizures -Will also add IV Ativan 1 mg every 6 hours.  Goal is to minimize seizures without causing excessive sedation while patient is in hospice -Discussed plan with family at bedside and Dr. Waldron Labs  I have spent a total of  35 minutes with the patient reviewing hospital notes,  test results, labs and examining the patient as well as establishing an assessment and plan.  > 50% of time was spent in direct patient care.   Zeb Comfort Epilepsy [facility] For questions after 5pm please refer to AMION to reach the Neurologist on call

## 2022-11-08 NOTE — Progress Notes (Signed)
Patient is comfort care.  Went in room to round on patient and patient is having a seizure not sure of exact time of onset, due to not being witnessed.  Patient continued to seize for several minutes, Versed PRN was given and seizure resolved.  Patient is currently non-responsive and agonal breathing.  BP 104/30, pulse 83, O2 97 on 2L and Respirations 11.  Patient resting in relaxed position with eyes closed.  Will continue to monitor.

## 2022-11-08 NOTE — Progress Notes (Signed)
PROGRESS NOTE    Jean Mcclain  KDX:[id number] DOB: 1945/10/20 DOA: 10/24/2022 PCP: Pcp, No   Chief Complaint  Patient presents with   Shaking    Brief Narrative:    Jean Mcclain is a 77 y.o. female with medical history significant of hypertension, hyperlipidemia, diabetes mellitus type II, ESRD on HD(M/W/F), chronic respiratory failure with hypoxia on 3 L of oxygen at night, Parkinson's disease, history of DVT not on anticoagulation, and wheelchair-bound due to left ankle fracture who presents for worsening shaking and jerking found to have seizures.  She was seen by neurology for evaluation for new onset seizures, which was felt  provoked by diagnosis of CVA (not acute), CVA happened over the last few month, as it was not present on MRI on 07/2022 , multiple seizures significant, for which she has been followed closely by neurology, where she was kept in LTM EEG when her medication has been adjusted , patient with significant failure to thrive, protein calorie malnutrition, in the setting of poor oral intake over the last few month secondary to persistent nausea and vomiting , she has been treated for diverticulitis last year, plan for colonoscopy as an outpatient as she never had colonoscopy in the past, it was a concern of chronic blood loss anemia from GI source, GI were consulted, plan was for endoscopy and at 1 point colonoscopy, but unfortunately she was unstable for procedure, where it had to be canceled twice for seizures, and low blood pressure, she did have anemia of acute blood loss, source most likely significant left shoulder/chest bruising/hematoma, likely in the setting of progressive thrombocytopenia, for which she required multiple PRBC transfusion, patient is extremely frail, deconditioned, malnourished, with very poor baseline, where she could not tolerate HD, due to very soft blood pressure, and frailty, her oral intake was very poor as well during hospital stay, and from  before due to her persistent nausea and vomiting.  Patient is approaching end-of-life, and she is unable to tolerate her hemodialysis, palliative medicine has been consulted, discussed with family and patient , transitioned to comfort care on 11/06/2022  Assessment & Plan:   Principal Problem:   Symptomatic anemia Active Problems:   Jerking   Acute diverticulitis   Acute on chronic respiratory failure with hypoxia (HCC)   Nausea and vomiting   ESRD on hemodialysis ([place])   Diabetes mellitus due to underlying condition, controlled, with stage 2 chronic kidney disease, with long-term current use of insulin (HCC)   Thrombocytopenia (HCC)   History of DVT (deep vein thrombosis)   Parkinson's disease   Abnormal TSH   Kidney lesion   ESRD on dialysis ([place])   Abnormal CT scan, gastrointestinal tract   Abnormal movements   Anemia   Seizure disorder (HCC)   Altered mental status      Symptomatic acute on chronic blood loss anemia.   Anemia of chronic kidney disease Nausea and vomitin - Possible occult GI blood loss with recent history of diverticulitis and pending outpatient colonoscopy.   -GI has been consulted, recommendation has been for endoscopy, endoscopy, endoscopy has not been scheduled twice, and had to be canceled given patient medical instability, and recurrent seizures, and hypertension, unfortunately she remains unstable. -Quired multiple units of PRBC transfusion during hospital stay -Aranesp and IV iron per renal -She is with significant bruising in the left upper chest, shoulder and breast area, this would largely contribute to her acute blood loss anemia, was managed with supportive transfusion  -He had multiple  imaging over the last few month showing persistent sigmoid diverticulitis, descending colon without complicating features, she was treated with antibiotics for this in the past, she had no pain in that area, she had no fever, no leukocytosis, she never had  colonoscopy regarding that, and was for colonoscopy, but as mentioned above she is medically unstable for that.  New onset seizures - followed closely by neurology, and kept on LTM EEG where her medication has been adjusted, and she was on IV Ativan as needed -Patient had 3 seizures event overnight and today, neurology follow-up greatly appreciated, her medication has been adjusted for which she was started on Ativan 1 mg IV every 6 hours. -TOC team checked with hospice facility at [facility], and they are able to accommodate IV Ativan and Dilantin at their facility.  ESRD on HD.  -Management per renal, she was unable to tolerate her HD, had to be stopped prematurely in the setting of instability, low blood pressure and hypotension and clinical instability.   Incidental Neck Mass  Noted on MRI. CT soft tissue neck shows mass that is most likely benign and most consistent with dermal inclusion cyst.    Acute on chronic hypoxic respiratory failure with underlying OSA.  Resolved. At home on 3 L and here on 2-3 L of oxygen.  Likely increased initially due to low hgb.    History of DVT in 2018.  Not on anticoagulation anymore, repeat lower extremity venous ultrasound ordered during admission remains negative.   History of RLS .  Continue home regimen.   History of sick euthyroid syndrome.  Stable TSH now.   persistent thrombocytopenia.  Severe PCM FTT Chronic debility -With albumin of 2,  She is with very poor oral intake over last few months secondary to persistent nausea and vomiting.  Kidney lesion  - Noted to be increased in size on CT scan from 07/2022 for which MRI of the abdomen recommended in 6 months at that time   DM type II.  A1c is 6.9 in 07/2022 .   Lack of IV access -Required PICC line  Goals of care discussion: With family multiple times, palliative medicine assistance greatly appreciated, her prognosis remains very poor, very poor baseline, malnutrition, chronic  debility, severe deconditioning and failure to thrive, this has progressed to the point where she cannot tolerate dialysis at this point, and she is with very poor oral intake, she has been transitioned to full comfort measure.   DVT prophylaxis:SCD Code Status: DNR/comfort Family communication: Discussed with brother at bedside Disposition: residential hospice  Status is: Inpatient    Consultants:  GI Renal Neurology Palliative   Subjective:  Patient had seizures overnight, another episode this morning and another episode in late morning, she is unable to provide any complaints given she is somnolent after receiving Ativan.  Objective: Vitals:   11/08/22 0200 11/08/22 0400 11/08/22 0600 11/08/22 0800  BP:  (!) 89/37  (!) 90/37  Pulse: 62 74 68 64  Resp: '15 14 11 12  '$ Temp:      TempSrc:      SpO2: 96% 95% 98% 94%  Weight:      Height:       No intake or output data in the 24 hours ending 11/08/22 1453  Filed Weights   11/03/22 1600 11/04/22 0411 11/05/22 1400  Weight: 62.9 kg 68.5 kg 65.7 kg    Examination:  He is sleeping today, extremely frail, deconditioned, chronically ill-appearing, obtundent as she just received IV Ativan for  seizure, she appears comfortable Diminished air entry bilaterally  Regular rate and rhythm, no rubs or gallops Abdomen soft, nontender, bowel sounds present Extremities with no clubbing and cyanosis    Data Reviewed: I have personally reviewed following labs and imaging studies  CBC: Recent Labs  Lab 11/03/22 0457 11/04/22 0051 11/04/22 0729 11/04/22 1428 11/05/22 0304 11/06/22 0442  WBC 6.1 5.4  --   --  4.6 4.1  HGB 7.7* 7.4* 8.0* 7.5* 9.0* 8.5*  HCT 25.2* 24.8* 25.0* 23.3* 29.0* 27.4*  MCV 88.7 89.9  --   --  90.1 89.3  PLT 71* 65*  --   --  56* 48*    Basic Metabolic Panel: Recent Labs  Lab 11/02/22 0757 11/03/22 0457 11/04/22 0051 11/05/22 0304 11/06/22 0442  NA 136 134* 136 135 136  K 3.8 4.1 4.1 4.0 3.7   CL 98 96* 101 101 102  CO2 '28 25 26 23 26  '$ GLUCOSE 78 80 102* 75 72  BUN 21 32* 30* 41* 35*  CREATININE 2.95* 3.62* 3.30* 4.14* 3.30*  CALCIUM 7.5* 7.5* 7.5* 7.3* 6.9*  PHOS 2.9 3.6 3.9 4.5 3.5    GFR: Estimated Creatinine Clearance: 12.3 mL/min (A) (by C-G formula based on SCr of 3.3 mg/dL (H)).  Liver Function Tests: Recent Labs  Lab 11/02/22 0757 11/03/22 0457 11/04/22 0051 11/05/22 0304 11/06/22 0442  ALBUMIN 2.4* 2.4* 2.3* 2.3* 2.0*    CBG: Recent Labs  Lab 11/05/22 1805 11/05/22 1834 11/05/22 2221 11/06/22 0817 11/07/22 0806  GLUCAP 66* 88 96 70 77     No results found for this or any previous visit (from the past 240 hour(s)).       Radiology Studies: No results found.      Scheduled Meds:  buprenorphine  1 patch Transdermal Weekly   camphor-menthol  1 Application Topical Daily   carbidopa-levodopa  2 tablet Oral q AM   And   carbidopa-levodopa  1 tablet Oral BID   feeding supplement (NEPRO CARB STEADY)  237 mL Oral TID BM    HYDROmorphone (DILAUDID) injection  0.5 mg Intravenous Q8H   LORazepam  1 mg Intravenous Q6H   melatonin  5 mg Oral QHS   Muscle Rub   Topical TID   phenytoin (DILANTIN) IV  50 mg Intravenous Q8H   rOPINIRole  12 mg Oral Daily   Continuous Infusions:  promethazine (PHENERGAN) injection (IM or IVPB)        LOS: 14 days        Phillips Climes, MD [facility]   To contact the attending provider between 7A-7P or the covering provider during after hours 7P-7A, please log into the web site [email or web address] and access using universal [place] password for that web site. If you do not have the password, please call the hospital operator.  11/08/2022, 2:53 PM

## 2022-11-08 NOTE — Progress Notes (Signed)
[place] 5W25 [facility]) Hospital Liaison Note  Received request from [place], [facility] for family interest in the [place]. Spoke with patient's brother, Ilona Sorrel, to confirm interest and explain services. Eligibility confirmed per [facility] MD. Per family request, will follow up tomorrow morning to check bed availability.    Please do not hesitate to call with any questions.    Thank you, Zigmund Gottron RN  [facility] Liaison [phone]

## 2022-11-08 NOTE — Progress Notes (Signed)
Patient is comfort care.  Family came out of room and advised patient was having a seizure.  Went into patient room and patient was actively seizing.  Family was distraught, so I had them wait in the hallway until I was able to give Versed and patient became postictal.  Patient wasn't coming out of seizure, Versed PRN was given and seizure resolved. Invited family back into patient room.  Explained to family patient had had one seizure overnight and another this morning after dayshift had arrived.  Patient currently non-responsive and agonal breathing.  BP 148/37, Pulse 80, O2 97% on 2L, Respirations 12.  Patient resting queitly in relaxed position with eyes closed.   Will continue to monitor. Dr. Waldron Labs entered room and I advised him of activity.

## 2022-11-08 NOTE — Plan of Care (Signed)
  Problem: Education: Goal: Ability to describe self-care measures that may prevent or decrease complications (Diabetes Survival Skills Education) will improve Outcome: Progressing Goal: Individualized Educational Video(s) Outcome: Progressing   Problem: Coping: Goal: Ability to adjust to condition or change in health will improve Outcome: Progressing   Problem: Fluid Volume: Goal: Ability to maintain a balanced intake and output will improve Outcome: Progressing   Problem: Health Behavior/Discharge Planning: Goal: Ability to identify and utilize available resources and services will improve Outcome: Progressing Goal: Ability to manage health-related needs will improve Outcome: Progressing   Problem: Metabolic: Goal: Ability to maintain appropriate glucose levels will improve Outcome: Progressing   Problem: Nutritional: Goal: Maintenance of adequate nutrition will improve Outcome: Progressing Goal: Progress toward achieving an optimal weight will improve Outcome: Progressing   Problem: Skin Integrity: Goal: Risk for impaired skin integrity will decrease Outcome: Progressing   Problem: Tissue Perfusion: Goal: Adequacy of tissue perfusion will improve Outcome: Progressing   Problem: Education: Goal: Knowledge of General Education information will improve Description: Including pain rating scale, medication(s)/side effects and non-pharmacologic comfort measures Outcome: Progressing   Problem: Health Behavior/Discharge Planning: Goal: Ability to manage health-related needs will improve Outcome: Progressing   Problem: Clinical Measurements: Goal: Ability to maintain clinical measurements within normal limits will improve Outcome: Progressing Goal: Will remain free from infection Outcome: Progressing Goal: Diagnostic test results will improve Outcome: Progressing Goal: Respiratory complications will improve Outcome: Progressing Goal: Cardiovascular complication will  be avoided Outcome: Progressing   Problem: Activity: Goal: Risk for activity intolerance will decrease Outcome: Progressing   Problem: Nutrition: Goal: Adequate nutrition will be maintained Outcome: Progressing   Problem: Coping: Goal: Level of anxiety will decrease Outcome: Progressing   Problem: Elimination: Goal: Will not experience complications related to bowel motility Outcome: Progressing Goal: Will not experience complications related to urinary retention Outcome: Progressing   Problem: Pain Managment: Goal: General experience of comfort will improve Outcome: Progressing   Problem: Safety: Goal: Ability to remain free from injury will improve Outcome: Progressing   Problem: Skin Integrity: Goal: Risk for impaired skin integrity will decrease Outcome: Progressing   Problem: Education: Goal: Knowledge of the prescribed therapeutic regimen will improve Outcome: Progressing   Problem: Coping: Goal: Ability to identify and develop effective coping behavior will improve Outcome: Progressing   Problem: Clinical Measurements: Goal: Quality of life will improve Outcome: Progressing   Problem: Respiratory: Goal: Verbalizations of increased ease of respirations will increase Outcome: Progressing   Problem: Role Relationship: Goal: Family's ability to cope with current situation will improve Outcome: Progressing Goal: Ability to verbalize concerns, feelings, and thoughts to partner or family member will improve Outcome: Progressing   Problem: Pain Management: Goal: Satisfaction with pain management regimen will improve Outcome: Progressing

## 2022-11-08 NOTE — TOC Progression Note (Signed)
Transition of Care [facility]) - Progression Note    Patient Details  Name: Jean Mcclain MRN: [id number] Date of Birth: June 03, 1946  Transition of Care [facility]) CM/SW [place], [place] Phone Number: 11/08/2022, 1:30 PM  Clinical Narrative:    Family has selected [facility] facility. Their liaison will complete assessment but should be able to provide IV ativan/Dilantin to patient.    Expected Discharge Plan: [place] Barriers to Discharge: Hospice Bed not available  Expected Discharge Plan and Services In-house Referral: Clinical Social Work   Post Acute Care Choice: [place] arrangements for the past 2 months: [place]                                       Social Determinants of Health (SDOH) Interventions SDOH Screenings   Food Insecurity: No Food Insecurity (10/25/2022)  Housing: Low Risk  (10/25/2022)  Transportation Needs: No Transportation Needs (10/25/2022)  Utilities: Not At Risk (10/25/2022)  Depression (PHQ2-9): Low Risk  (12/06/2019)  Tobacco Use: Medium Risk (11/06/2022)    Readmission Risk Interventions    10/31/2022    9:27 AM  Readmission Risk Prevention Plan  Transportation Screening Complete  Medication Review ([place]) Complete  PCP or Specialist appointment within 3-5 days of discharge Complete  HRI or [place] Complete  SW Recovery Care/Counseling Consult Complete  [place] Not Applicable

## 2022-11-08 NOTE — Progress Notes (Signed)
Patient continues to be comfort care. Pt had a seizure lasting several minutes, unsure of the exact time frame due to beginning of seizure not being witnessed. Versed PRN given and seizure resolved. Patient is now non-responsive and agonal breathing. BP 95/66, pulse 83, O2 96% on 3L [place]. Dr. Velia Meyer notified. Patient resting calmly with eyes closed. Will continue to monitor.

## 2022-11-08 NOTE — Progress Notes (Signed)
Pt is alert and oriented to self and place, breathing is regular and unlabored. PRN medication given for pain. Will continue to monitor.

## 2022-11-09 DIAGNOSIS — J9621 Acute and chronic respiratory failure with hypoxia: Secondary | ICD-10-CM | POA: Diagnosis not present

## 2022-11-09 DIAGNOSIS — D649 Anemia, unspecified: Secondary | ICD-10-CM | POA: Diagnosis not present

## 2022-11-09 DIAGNOSIS — G40909 Epilepsy, unspecified, not intractable, without status epilepticus: Secondary | ICD-10-CM | POA: Diagnosis not present

## 2022-11-09 DIAGNOSIS — N186 End stage renal disease: Secondary | ICD-10-CM | POA: Diagnosis not present

## 2022-11-09 MED ORDER — HYDROMORPHONE HCL 1 MG/ML IJ SOLN: 0.5000 mg | Freq: Three times a day (TID) | INTRAMUSCULAR | 0 refills | Status: DC

## 2022-11-09 MED ORDER — MIDAZOLAM HCL 2 MG/2ML IJ SOLN: 2.0000 mg | INTRAMUSCULAR | 0 refills | Status: DC | PRN

## 2022-11-09 MED ORDER — LORAZEPAM 2 MG/ML IJ SOLN: 1.0000 mg | Freq: Four times a day (QID) | INTRAMUSCULAR | 0 refills | Status: DC

## 2022-11-09 MED ORDER — PHENYTOIN SODIUM 50 MG/ML IJ SOLN: 50.0000 mg | Freq: Three times a day (TID) | INTRAMUSCULAR | Status: DC

## 2022-11-09 MED ORDER — HYDROMORPHONE HCL 1 MG/ML IJ SOLN: 0.5000 mg | INTRAMUSCULAR | 0 refills | Status: DC | PRN

## 2022-11-09 MED ORDER — ALBUTEROL SULFATE (2.5 MG/3ML) 0.083% IN NEBU: 2.5000 mg | INHALATION_SOLUTION | Freq: Four times a day (QID) | RESPIRATORY_TRACT | 12 refills | Status: DC | PRN

## 2022-11-09 NOTE — Plan of Care (Signed)
  Problem: Education: Goal: Ability to describe self-care measures that may prevent or decrease complications (Diabetes Survival Skills Education) will improve Outcome: Progressing Goal: Individualized Educational Video(s) Outcome: Progressing   Problem: Coping: Goal: Ability to adjust to condition or change in health will improve Outcome: Progressing   Problem: Fluid Volume: Goal: Ability to maintain a balanced intake and output will improve Outcome: Progressing   Problem: Health Behavior/Discharge Planning: Goal: Ability to identify and utilize available resources and services will improve Outcome: Progressing Goal: Ability to manage health-related needs will improve Outcome: Progressing   Problem: Metabolic: Goal: Ability to maintain appropriate glucose levels will improve Outcome: Progressing   Problem: Nutritional: Goal: Maintenance of adequate nutrition will improve Outcome: Progressing Goal: Progress toward achieving an optimal weight will improve Outcome: Progressing   Problem: Skin Integrity: Goal: Risk for impaired skin integrity will decrease Outcome: Progressing   Problem: Tissue Perfusion: Goal: Adequacy of tissue perfusion will improve Outcome: Progressing   Problem: Education: Goal: Knowledge of General Education information will improve Description: Including pain rating scale, medication(s)/side effects and non-pharmacologic comfort measures Outcome: Progressing   Problem: Health Behavior/Discharge Planning: Goal: Ability to manage health-related needs will improve Outcome: Progressing   Problem: Clinical Measurements: Goal: Ability to maintain clinical measurements within normal limits will improve Outcome: Progressing Goal: Will remain free from infection Outcome: Progressing Goal: Diagnostic test results will improve Outcome: Progressing Goal: Respiratory complications will improve Outcome: Progressing Goal: Cardiovascular complication will  be avoided Outcome: Progressing   Problem: Activity: Goal: Risk for activity intolerance will decrease Outcome: Progressing   Problem: Nutrition: Goal: Adequate nutrition will be maintained Outcome: Progressing   Problem: Coping: Goal: Level of anxiety will decrease Outcome: Progressing   Problem: Elimination: Goal: Will not experience complications related to bowel motility Outcome: Progressing Goal: Will not experience complications related to urinary retention Outcome: Progressing   Problem: Pain Managment: Goal: General experience of comfort will improve Outcome: Progressing   Problem: Safety: Goal: Ability to remain free from injury will improve Outcome: Progressing   Problem: Skin Integrity: Goal: Risk for impaired skin integrity will decrease Outcome: Progressing   Problem: Education: Goal: Knowledge of the prescribed therapeutic regimen will improve Outcome: Progressing   Problem: Coping: Goal: Ability to identify and develop effective coping behavior will improve Outcome: Progressing   Problem: Clinical Measurements: Goal: Quality of life will improve Outcome: Progressing   Problem: Respiratory: Goal: Verbalizations of increased ease of respirations will increase Outcome: Progressing   Problem: Role Relationship: Goal: Family's ability to cope with current situation will improve Outcome: Progressing Goal: Ability to verbalize concerns, feelings, and thoughts to partner or family member will improve Outcome: Progressing   Problem: Pain Management: Goal: Satisfaction with pain management regimen will improve Outcome: Progressing

## 2022-11-09 NOTE — Progress Notes (Signed)
Report has been called and given to [place] at [facility] in [place], [place].

## 2022-11-09 NOTE — Care Management Important Message (Signed)
Important Message  Patient Details  Name: Jean Mcclain MRN: [id number] Date of Birth: [birth year]   Medicare Important Message Given:  Yes     Caesar Mannella 11/09/2022, 1:17 PM

## 2022-11-09 NOTE — Progress Notes (Signed)
[place] 5W25 [facility]) Hospital Liaison Note  Spoke with Patient's brother, Ilona Sorrel to offer bed at the [facility]. Bed offer accepted and Ilona Sorrel is agreeable to transfer today.   TOC and hospital staff made aware.  RN, please call report to [phone] prior to patient leaving the unit. Please send signed and completed DNR with patient at discharge.   Thank you,  Zigmund Gottron RN  [facility] Liaison [phone]

## 2022-11-09 NOTE — Care Management Important Message (Signed)
Important Message  Patient Details  Name: [name] MRN: [id number] Date of Birth: [birth year]   Medicare Important Message Given:  Yes     Jean Mcclain 11/09/2022, 1:16 PM

## 2022-11-09 NOTE — Plan of Care (Signed)
  Problem: Education: Goal: Ability to describe self-care measures that may prevent or decrease complications (Diabetes Survival Skills Education) will improve Outcome: Adequate for Discharge Goal: Individualized Educational Video(s) Outcome: Adequate for Discharge   Problem: Coping: Goal: Ability to adjust to condition or change in health will improve Outcome: Adequate for Discharge   Problem: Fluid Volume: Goal: Ability to maintain a balanced intake and output will improve Outcome: Adequate for Discharge   Problem: Health Behavior/Discharge Planning: Goal: Ability to identify and utilize available resources and services will improve Outcome: Adequate for Discharge Goal: Ability to manage health-related needs will improve Outcome: Adequate for Discharge   Problem: Metabolic: Goal: Ability to maintain appropriate glucose levels will improve Outcome: Adequate for Discharge   Problem: Nutritional: Goal: Maintenance of adequate nutrition will improve Outcome: Adequate for Discharge Goal: Progress toward achieving an optimal weight will improve Outcome: Adequate for Discharge   Problem: Skin Integrity: Goal: Risk for impaired skin integrity will decrease Outcome: Adequate for Discharge   Problem: Tissue Perfusion: Goal: Adequacy of tissue perfusion will improve Outcome: Adequate for Discharge   Problem: Education: Goal: Knowledge of General Education information will improve Description: Including pain rating scale, medication(s)/side effects and non-pharmacologic comfort measures Outcome: Adequate for Discharge   Problem: Health Behavior/Discharge Planning: Goal: Ability to manage health-related needs will improve Outcome: Adequate for Discharge   Problem: Clinical Measurements: Goal: Ability to maintain clinical measurements within normal limits will improve Outcome: Adequate for Discharge Goal: Will remain free from infection Outcome: Adequate for Discharge Goal:  Diagnostic test results will improve Outcome: Adequate for Discharge Goal: Respiratory complications will improve Outcome: Adequate for Discharge Goal: Cardiovascular complication will be avoided Outcome: Adequate for Discharge   Problem: Activity: Goal: Risk for activity intolerance will decrease Outcome: Adequate for Discharge   Problem: Nutrition: Goal: Adequate nutrition will be maintained Outcome: Adequate for Discharge   Problem: Coping: Goal: Level of anxiety will decrease Outcome: Adequate for Discharge   Problem: Elimination: Goal: Will not experience complications related to bowel motility Outcome: Adequate for Discharge Goal: Will not experience complications related to urinary retention Outcome: Adequate for Discharge   Problem: Pain Managment: Goal: General experience of comfort will improve Outcome: Adequate for Discharge   Problem: Safety: Goal: Ability to remain free from injury will improve Outcome: Adequate for Discharge   Problem: Skin Integrity: Goal: Risk for impaired skin integrity will decrease Outcome: Adequate for Discharge   Problem: Education: Goal: Knowledge of the prescribed therapeutic regimen will improve Outcome: Adequate for Discharge   Problem: Coping: Goal: Ability to identify and develop effective coping behavior will improve Outcome: Adequate for Discharge   Problem: Clinical Measurements: Goal: Quality of life will improve Outcome: Adequate for Discharge   Problem: Respiratory: Goal: Verbalizations of increased ease of respirations will increase Outcome: Adequate for Discharge   Problem: Role Relationship: Goal: Family's ability to cope with current situation will improve Outcome: Adequate for Discharge Goal: Ability to verbalize concerns, feelings, and thoughts to partner or family member will improve Outcome: Adequate for Discharge   Problem: Pain Management: Goal: Satisfaction with pain management regimen will  improve Outcome: Adequate for Discharge

## 2022-11-09 NOTE — Discharge Summary (Signed)
PATIENT DETAILS Name: Jean Mcclain Age: 77 y.o. Sex: female Date of Birth: [birth year] MRN: [id number]. Admitting Physician: Thurnell Lose, MD PCP:Pcp, No  Admit Date: 10/24/2022 Discharge date: 11/09/2022  Recommendations for Outpatient Follow-up:  Optimize comfort measures  Admitted From:  SNF  Disposition: Hospice care   Discharge Condition: poor  CODE STATUS:   Code Status: DNR   Diet recommendation:  Diet Order             Diet general           Diet regular Room service appropriate? Yes; Fluid consistency: Thin  Diet effective now                    Brief Summary: Jean Mcclain is a 77 y.o. female with medical history significant of hypertension, hyperlipidemia, diabetes mellitus type II, ESRD on HD(M/W/F), chronic respiratory failure with hypoxia on 3 L of oxygen at night, Parkinson's disease, history of DVT not on anticoagulation, and wheelchair-bound due to left ankle fracture who was admitted to the hospital for new onset of seizures.  Hospital course was prolonged and complicated by hard to control seizures, severe anemia requiring PRBC transfusion.  Unfortunately patient continued to deteriorate-and was not able to tolerate hemodialysis.  After extensive discussion by palliative care team with family-patient was transitioned to full comfort measures on 1/28.   Brief Hospital Course: New onset seizures Thought to be provoked by recent CVA Followed closely by neurology during this hospital stay Although transition to comfort measures-plan is to continue Dilantin/scheduled IV Ativan for comfort.  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  No plans for endoscopy.  ESRD Followed closely by nephrology-unfortunately she was unable to tolerate HD due to  instability/hypotension After extensive discussion with family-transition to full comfort measures  Acute on chronic hypoxic respiratory failure OSA Supportive care Continue oxygen for comfort..  On 3 L of oxygen at home.  History of RLS Supportive care  Parkinson's disease Continue Sinemet as long as possible  Incidental neck mass Seen on imaging studies Suspected to be abdominal inclusion cyst No plans to pursue surgical excision given comfort care  DM-2 (A1c 6.9 on 07/2022) No longer on SSI  Severe protein calorie malnutrition Failure to thrive syndrome Chronic debility Comfort care in effect.  Pressure Ulcer: Pressure Injury 10/25/22 Sacrum Mid Stage 1 -  Intact skin with non-blanchable redness of a localized area usually over a bony prominence. rednes noted (Active)  10/25/22 1445  Location: Sacrum  Location Orientation: Mid  Staging: Stage 1 -  Intact skin with non-blanchable redness of a localized area usually over a bony prominence.  Wound Description (Comments): rednes noted  Present on Admission: Yes  Dressing Type Foam - Lift dressing to assess site every shift 11/09/22 0800    Discharge Diagnoses:  Principal Problem:   Symptomatic anemia Active Problems:   Jerking   Acute diverticulitis   Acute on chronic respiratory failure with hypoxia (HCC)   Nausea and vomiting   ESRD on hemodialysis (HCC)   Diabetes mellitus due to underlying condition, controlled, with stage 2 chronic kidney disease, with long-term current use of insulin (HCC)   Thrombocytopenia (HCC)   History of DVT (deep vein thrombosis)   Parkinson's disease   Abnormal TSH   Kidney lesion   ESRD on dialysis ([facility])  Abnormal CT scan, gastrointestinal tract   Abnormal movements   Anemia   Seizure disorder (HCC)   Altered mental status   Discharge Instructions:  Activity:    Discharge Instructions     Diet general   Complete by: As directed    Increase activity slowly   Complete  by: As directed    No wound care   Complete by: As directed       Allergies as of 11/09/2022       Reactions   Ms Contin [morphine] Nausea Only        Medication List     STOP taking these medications    acetaminophen 500 MG tablet Commonly known as: TYLENOL   albuterol 108 (90 Base) MCG/ACT inhaler Commonly known as: VENTOLIN HFA Replaced by: albuterol (2.5 MG/3ML) 0.083% nebulizer solution   atorvastatin 20 MG tablet Commonly known as: LIPITOR   docusate sodium 100 MG capsule Commonly known as: COLACE   doxercalciferol 4 MCG/2ML injection Commonly known as: HECTOROL   fluticasone 50 MCG/ACT nasal spray Commonly known as: FLONASE   gabapentin 300 MG capsule Commonly known as: NEURONTIN   GAS-X EXTRA STRENGTH PO   Glycerin (Adult) 2 g Supp   hydrOXYzine 50 MG tablet Commonly known as: ATARAX   insulin aspart 100 UNIT/ML injection Commonly known as: novoLOG   loperamide 2 MG tablet Commonly known as: IMODIUM A-D   melatonin 5 MG Tabs   metoCLOPramide 10 MG tablet Commonly known as: REGLAN   midodrine 10 MG tablet Commonly known as: PROAMATINE   mirtazapine 15 MG tablet Commonly known as: REMERON   NovoLOG FlexPen 100 UNIT/ML FlexPen Generic drug: insulin aspart   Nyamyc powder Generic drug: nystatin   ondansetron 8 MG disintegrating tablet Commonly known as: ZOFRAN-ODT   pantoprazole 40 MG tablet Commonly known as: PROTONIX   polyethylene glycol powder 17 GM/SCOOP powder Commonly known as: GLYCOLAX/MIRALAX   promethazine 12.5 MG suppository Commonly known as: PHENERGAN   Ropinirole HCl 12 MG Tb24   Sarna Sensitive 1 % Lotn Generic drug: pramoxine   tamsulosin 0.4 MG Caps capsule Commonly known as: FLOMAX       TAKE these medications    albuterol (2.5 MG/3ML) 0.083% nebulizer solution Commonly known as: PROVENTIL Take 3 mLs (2.5 mg total) by nebulization every 6 (six) hours as needed for wheezing or shortness of  breath. Replaces: albuterol 108 (90 Base) MCG/ACT inhaler   buprenorphine 10 MCG/HR Ptwk Commonly known as: BUTRANS Place 1 patch onto the skin once a week.   carbidopa-levodopa 25-100 MG tablet Commonly known as: SINEMET IR Take 1-2 tablets by mouth See admin instructions. 2 entries on MAR: Take 2 tablets by mouth every morning. Take 1 tablet by mouth twice daily.   HYDROmorphone 1 MG/ML injection Commonly known as: DILAUDID Inject 0.5 mLs (0.5 mg total) into the vein every 8 (eight) hours.   HYDROmorphone 1 MG/ML injection Commonly known as: DILAUDID Inject 0.5-1 mLs (0.5-1 mg total) into the vein every hour as needed (Pain/Dyspnea).   LORazepam 2 MG/ML injection Commonly known as: ATIVAN Inject 0.5 mLs (1 mg total) into the vein every 6 (six) hours.   midazolam 2 MG/2ML Soln injection Commonly known as: VERSED Inject 2 mLs (2 mg total) into the vein every hour as needed for agitation or sedation (siezures).   OXYGEN Inhale 3 L into the lungs at bedtime as needed (SOB).   phenytoin 50 MG/ML injection Commonly known as: DILANTIN Inject 1 mL (50 mg total)  into the vein every 8 (eight) hours.        Allergies  Allergen Reactions   Ms Contin [Morphine] Nausea Only     Other Procedures/Studies: [place] EKG SITE RITE  Result Date: 11/05/2022 If Site Rite image not attached, placement could not be confirmed due to current cardiac rhythm.  CT ABDOMEN PELVIS WO CONTRAST  Result Date: 11/04/2022 CLINICAL DATA:  Anemia EXAM: CT ABDOMEN AND PELVIS WITHOUT CONTRAST TECHNIQUE: Multidetector CT imaging of the abdomen and pelvis was performed following the standard protocol without IV contrast. RADIATION DOSE REDUCTION: This exam was performed according to the departmental dose-optimization program which includes automated exposure control, adjustment of the mA and/or kV according to patient size and/or use of iterative reconstruction technique. COMPARISON:  10/20/2022 FINDINGS:  Lower chest: Bilateral pleural effusions are noted slightly larger on the right than the left. These are increased from the prior exam. Lower lobe consolidation is noted also right greater than left. Hepatobiliary: No focal liver abnormality is seen. No gallstones, gallbladder wall thickening, or biliary dilatation. Pancreas: Unremarkable. No pancreatic ductal dilatation or surrounding inflammatory changes. Spleen: Normal in size without focal abnormality. Adrenals/Urinary Tract: Adrenal glands are stable from the prior exam. Again a small 11 mm nodule is noted within the left adrenal gland consistent with small adenoma unchanged from 20/10. Kidneys are well visualized bilaterally with diffuse cortical thinning. Cystic changes are noted similar to that seen on the prior exam. No obstructive changes are seen. No renal calculi are noted. The bladder is partially distended. Stomach/Bowel: There is again noted wall thickening in the sigmoid colon related to diverticular change. The degree of inflammatory change has improved in the interval from the prior study. No abscess is identified. More proximal colon is within normal limits with the exception of diverticular change. Stomach is within normal limits. No obstructive changes in the small bowel are noted. Vascular/Lymphatic: Aortic atherosclerosis. No enlarged abdominal or pelvic lymph nodes. Reproductive: Uterus is within normal limits. Calcifications in the right ovary are again noted and stable. A simple appearing left ovarian cyst is noted measuring 3.6 cm. This is stable from the prior exam Other: No abdominal wall hernia or abnormality. No abdominopelvic ascites. Musculoskeletal: No acute or significant osseous findings. Old rib fractures are noted on the left and stable. IMPRESSION: Persistent changes of previous diverticulitis with persistent wall thickening. The degree of inflammatory changes have improved in the interval from the prior exam. 3.6 cm left  simple appearing ovarian cyst. Recommend follow-up US in 6-12 months. Note: This recommendation does not apply to premenarchal patients and to those with increased risk (genetic, family history, elevated tumor markers or other high-risk factors) of ovarian cancer. Reference: JACR 2020 Feb; 17(2):248-254 Stable left adrenal adenoma. No follow-up is recommended as it is been stable for over 13 years. Small effusions and lower lobe consolidation increased in the interval from the prior exam. Exophytic cyst in the right kidney similar to that seen on the prior exam. Again nonemergent outpatient MRI is recommended for further evaluation. Electronically Signed   By: Inez Catalina M.D.   On: 11/04/2022 17:14   ECHOCARDIOGRAM COMPLETE  Result Date: 11/04/2022    ECHOCARDIOGRAM REPORT   Patient Name:   Jean Mcclain Date of Exam: 11/04/2022 Medical Rec #:  [id number]        Height:       60.0 in Accession #:    [id number]       Weight:       151.0  lb Date of Birth:  [birth year]         BSA:          1.657 m Patient Age:    40 years         BP:           101/54 mmHg Patient Gender: F                HR:           70 bpm. Exam Location:  Inpatient Procedure: 2D Echo Indications:    syncope  History:        Patient has prior history of Echocardiogram examinations, most                 recent 07/22/2022. End stage renal disease. Parkinson's                 disease.; Risk Factors:Sleep Apnea and Diabetes.  Sonographer:    Johny Chess RDCS Referring Phys: 32 [place]  1. Left ventricular ejection fraction, by estimation, is 60 to 65%. The left ventricle has normal function. The left ventricle has no regional wall motion abnormalities. There is mild left ventricular hypertrophy. Left ventricular diastolic parameters are consistent with Grade I diastolic dysfunction (impaired relaxation).  2. Right ventricular systolic function is normal. The right ventricular size is normal.  3. Left atrial size was  mildly dilated.  4. The mitral valve is abnormal. Trivial mitral valve regurgitation. No evidence of mitral stenosis. Severe mitral annular calcification.  5. The aortic valve is tricuspid. There is moderate calcification of the aortic valve. There is moderate thickening of the aortic valve. Aortic valve regurgitation is not visualized. Aortic valve sclerosis/calcification is present, without any evidence of aortic stenosis.  6. The inferior vena cava is normal in size with greater than 50% respiratory variability, suggesting right atrial pressure of 3 mmHg. FINDINGS  Left Ventricle: Left ventricular ejection fraction, by estimation, is 60 to 65%. The left ventricle has normal function. The left ventricle has no regional wall motion abnormalities. The left ventricular internal cavity size was normal in size. There is  mild left ventricular hypertrophy. Left ventricular diastolic parameters are consistent with Grade I diastolic dysfunction (impaired relaxation). Right Ventricle: The right ventricular size is normal. No increase in right ventricular wall thickness. Right ventricular systolic function is normal. Left Atrium: Left atrial size was mildly dilated. Right Atrium: Right atrial size was normal in size. Pericardium: There is no evidence of pericardial effusion. Mitral Valve: The mitral valve is abnormal. There is mild thickening of the mitral valve leaflet(s). There is mild calcification of the mitral valve leaflet(s). Severe mitral annular calcification. Trivial mitral valve regurgitation. No evidence of mitral valve stenosis. MV peak gradient, 5.6 mmHg. The mean mitral valve gradient is 2.0 mmHg. Tricuspid Valve: The tricuspid valve is normal in structure. Tricuspid valve regurgitation is mild . No evidence of tricuspid stenosis. Aortic Valve: The aortic valve is tricuspid. There is moderate calcification of the aortic valve. There is moderate thickening of the aortic valve. Aortic valve regurgitation is not  visualized. Aortic valve sclerosis/calcification is present, without any  evidence of aortic stenosis. Pulmonic Valve: The pulmonic valve was normal in structure. Pulmonic valve regurgitation is trivial. No evidence of pulmonic stenosis. Aorta: The aortic root is normal in size and structure. Venous: The inferior vena cava is normal in size with greater than 50% respiratory variability, suggesting right atrial pressure of 3 mmHg. IAS/Shunts: No atrial level  shunt detected by color flow Doppler.  LEFT VENTRICLE PLAX 2D LVIDd:         3.90 cm   Diastology LVIDs:         2.20 cm   LV e' medial:    7.30 cm/s LV PW:         1.00 cm   LV E/e' medial:  12.6 LV IVS:        1.20 cm   LV e' lateral:   9.17 cm/s LVOT diam:     1.80 cm   LV E/e' lateral: 10.1 LV SV:         50 LV SV Index:   30 LVOT Area:     2.54 cm  RIGHT VENTRICLE             IVC RV Basal diam:  2.60 cm     IVC diam: 1.10 cm RV S prime:     10.40 cm/s TAPSE (M-mode): 2.2 cm LEFT ATRIUM             Index        RIGHT ATRIUM           Index LA diam:        3.80 cm 2.29 cm/m   RA Area:     12.80 cm LA Vol (A2C):   68.2 ml 41.17 ml/m  RA Volume:   28.60 ml  17.27 ml/m LA Vol (A4C):   60.2 ml 36.34 ml/m LA Biplane Vol: 66.6 ml 40.20 ml/m  AORTIC VALVE LVOT Vmax:   89.60 cm/s LVOT Vmean:  58.400 cm/s LVOT VTI:    0.198 m  AORTA Ao Root diam: 2.90 cm Ao Asc diam:  3.20 cm MITRAL VALVE MV Area (PHT): 2.56 cm     SHUNTS MV Area VTI:   1.48 cm     Systemic VTI:  0.20 m MV Peak grad:  5.6 mmHg     Systemic Diam: 1.80 cm MV Mean grad:  2.0 mmHg MV Vmax:       1.18 m/s MV Vmean:      69.7 cm/s MV Decel Time: 296 msec MV E velocity: 92.20 cm/s MV A velocity: 113.00 cm/s MV E/A ratio:  0.82 Jenkins Rouge MD Electronically signed by Jenkins Rouge MD Signature Date/Time: 11/04/2022/12:40:21 PM    Final    DG Abd Portable 1V  Result Date: 10/30/2022 CLINICAL DATA:  Nausea and vomiting. EXAM: PORTABLE ABDOMEN - 1 VIEW COMPARISON:  10/24/2022 FINDINGS: No pathologic  dilatation of the large or small bowel loops identified. There is gas noted within the colon and rectum. Benign calcification within the right pelvis is again noted. IMPRESSION: Nonobstructive bowel gas pattern. Electronically Signed   By: Kerby Moors M.D.   On: 10/30/2022 07:38   CT SOFT TISSUE NECK W CONTRAST  Result Date: 10/27/2022 CLINICAL DATA:  Neck mass, non pulsatile, present for several years and seen by preceding brain MRI EXAM: CT NECK WITH CONTRAST TECHNIQUE: Multidetector CT imaging of the neck was performed using the standard protocol following the bolus administration of intravenous contrast. RADIATION DOSE REDUCTION: This exam was performed according to the departmental dose-optimization program which includes automated exposure control, adjustment of the mA and/or kV according to patient size and/or use of iterative reconstruction technique. CONTRAST:  47m OMNIPAQUE IOHEXOL 350 MG/ML SOLN COMPARISON:  None Available. FINDINGS: Pharynx and larynx: No evidence of mass or inflammation. Salivary glands: Normal Thyroid: No significant finding Lymph nodes: No worrisome lymph node enlargement or heterogeneity.  Vascular: Partial retropharyngeal course. Mild atheromatous calcification for age and end-stage renal disease Limited intracranial: Negative Visualized orbits: Not covered Mastoids and visualized paranasal sinuses: Clear Skeleton: Cervical spine degeneration with confluent osteophytes in the thoracic spine. Advanced C1-2 facet and left shoulder degeneration. Upper chest: Head sizable layering pleural effusions with atelectasis. Other: The finding of concern by prior brain MRI is a subcutaneous homogeneous mass measuring 2.7 cm in the left supraclavicular neck, most consistent with dermal inclusion cyst or other benign process, appearance stable from a thoracic spine CT 01/09/2022 IMPRESSION: 1. The subcutaneous left neck mass by MRI has benign and stable CT features most consistent with  dermal inclusion cyst or other benign process. 2. Sizable layering pleural effusions with atelectasis. Electronically Signed   By: Jorje Guild M.D.   On: 10/27/2022 12:04   Overnight EEG with video  Result Date: 10/27/2022 Lora Havens, MD     10/28/2022  8:49 AM Patient Name: Jean Mcclain MRN: [id number] Epilepsy Attending: Lora Havens Referring Physician/Provider: Lora Havens, MD Duration: 10/26/2022 1318 to 10/27/2022 1318  Patient history: 77 year old female with seizure-like episodes.  EEG to evaluate for seizure.  Level of alertness: Awake, asleep  AEDs during EEG study: LEV  Technical aspects: This EEG study was done with scalp electrodes positioned according to the 10-20 International system of electrode placement. Electrical activity was reviewed with band pass filter of 1-'70Hz'$ , sensitivity of 7 uV/mm, display speed of 80m/sec with a '60Hz'$  notched filter applied as appropriate. EEG data were recorded continuously and digitally stored.  Video monitoring was available and reviewed as appropriate.  Description: The posterior dominant rhythm consists of '8Hz'$  activity of moderate voltage (25-35 uV) seen predominantly in posterior head regions, symmetric and reactive to eye opening and eye closing. Sleep was characterized by vertex waves, sleep spindles (12 to 14 Hz), maximal frontocentral region. EEG showed intermittent generalized predominantly 5 to 7 Hz theta slowing admixed with 2 to 3 Hz generalized delta slowing. Hyperventilation and photic stimulation were not performed.    ABNORMALITY -Intermittent slow, generalized  IMPRESSION: This study is suggestive of mild to moderate diffuse encephalopathy, nonspecific etiology.  No seizures or epileptiform discharges were seen throughout the recording.  PLora Havens  MR BRAIN WO CONTRAST  Result Date: 10/26/2022 CLINICAL DATA:  Provided history: Seizure, new onset, no history of trauma. EXAM: MRI HEAD WITHOUT CONTRAST TECHNIQUE:  Multiplanar, multiecho pulse sequences of the brain and surrounding structures were obtained without intravenous contrast. COMPARISON:  CT 10/24/2022. Brain MRI 07/21/2022. FINDINGS: Intermittently motion degraded examination. Most notably, there is moderate-to-severe motion degradation of the least motion degraded axial T2 sequence, and moderate-to-severe motion degradation of the coronal T2 sequence through the hippocampi. Within this limitation, findings are as follows: Brain: No age advanced or lobar predominant parenchymal atrophy. Redemonstrated small focus of encephalomalacia within the left parietal lobe (with involvement of the postcentral gyrus) consistent with a chronic infarct (series 8, image 17). No significant cerebral white matter disease for age. No appreciable hippocampal size or signal asymmetry. There is no acute infarct. No evidence of an intracranial mass. No extra-axial fluid collection. No midline shift. Vascular: Maintained flow voids within the proximal large arterial vessels. Skull and upper cervical spine: No focal suspicious marrow lesion. Incompletely assessed cervical spondylosis. Ligamentous hypertrophy/pannus formation about the C1-C2 articulation. Sinuses/Orbits: No mass or acute finding within the imaged orbits. Prior bilateral ocular lens replacement. Minimal mucosal thickening within the bilateral sphenoid sinuses. Other: Incompletely imaged 2.3 cm  T2 hyperintense and T1 hypointense subcutaneous mass/lesion within the left lower neck/left shoulder soft tissues (for instance as seen on series 12, image 2) (series 14, image 16). Corresponding restricted diffusion at this site. IMPRESSION: 1. Motion degraded examination. 2. No evidence of acute intracranial abnormality. 3. 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 4. Incompletely imaged and incompletely assessed 2.3 cm ovoid subcutaneous mass/lesion within the left lower neck/left shoulder soft  tissues. A neck CT is recommended for further evaluation. Electronically Signed   By: Kellie Simmering D.O.   On: 10/26/2022 12:28   EEG adult  Result Date: 10/25/2022 Lora Havens, MD     10/25/2022  2:02 PM Patient Name: AALEIGHA BOZZA MRN: [id number] Epilepsy Attending: Lora Havens Referring Physician/Provider: Ripley Fraise, MD Date: 10/25/2022 Duration: 24.06 mins Patient history: 77 year old female with seizure-like episodes.  EEG to evaluate for seizure. Level of alertness: Awake, asleep AEDs during EEG study: LEV, versed Technical aspects: This EEG study was done with scalp electrodes positioned according to the 10-20 International system of electrode placement. Electrical activity was reviewed with band pass filter of 1-'70Hz'$ , sensitivity of 7 uV/mm, display speed of 44m/sec with a '60Hz'$  notched filter applied as appropriate. EEG data were recorded continuously and digitally stored.  Video monitoring was available and reviewed as appropriate. Description: During awake state, no clear posterior dominant rhythm was seen.  EEG showed continuous generalized predominantly 5 to 7 Hz theta slowing admixed with 2 to 3 Hz generalized delta slowing.  Sleep was characterized by vertex waves, sleep spindles (12 to 14 Hz), maximal frontocentral region. Hyperventilation and photic stimulation were not performed.   ABNORMALITY - Continuous slow, generalized IMPRESSION: This study is suggestive of moderate diffuse encephalopathy, nonspecific etiology.  No seizures or epileptiform discharges were seen throughout the recording. Priyanka O Yadav   VAS [place] EXTREMITY VENOUS (DVT)  Result Date: 10/24/2022  Lower Venous DVT Study Patient Name:  RMODUPE SHAMPINE Date of Exam:   10/24/2022 Medical Rec #: [id number]        Accession #:    [id number] of Birth: 512-19-1947         Patient Gender: F Patient Age:   89years [facility]:      [facility] [place] EXTREMITY VENOUS (DVT)  Referring Phys: RFuller Plan--------------------------------------------------------------------------------  Indications: Swelling.  Comparison Study: Prior negative bilateral LEV done 07/22/22 Performing Technologist: CSharion DoveRVS  Examination Guidelines: A complete evaluation includes B-mode imaging, spectral Doppler, color Doppler, and power Doppler as needed of all accessible portions of each vessel. Bilateral testing is considered an integral part of a complete examination. Limited examinations for reoccurring indications may be performed as noted. The reflux portion of the exam is performed with the patient in reverse Trendelenburg.  +---------+---------------+---------+-----------+----------+-------------------+ RIGHT    CompressibilityPhasicitySpontaneityPropertiesThrombus Aging      +---------+---------------+---------+-----------+----------+-------------------+ CFV      Full           Yes      Yes                                      +---------+---------------+---------+-----------+----------+-------------------+ SFJ      Full                                                             +---------+---------------+---------+-----------+----------+-------------------+  FV Prox  Full                                                             +---------+---------------+---------+-----------+----------+-------------------+ FV Mid   Full                                                             +---------+---------------+---------+-----------+----------+-------------------+ FV DistalFull                                                             +---------+---------------+---------+-----------+----------+-------------------+ PFV      Full                                                             +---------+---------------+---------+-----------+----------+-------------------+ POP      Full           Yes      Yes                                       +---------+---------------+---------+-----------+----------+-------------------+ PTV                                                   Not well visualized +---------+---------------+---------+-----------+----------+-------------------+ PERO                                                  Not well visualized +---------+---------------+---------+-----------+----------+-------------------+   +---------+---------------+---------+-----------+----------+--------------+ LEFT     CompressibilityPhasicitySpontaneityPropertiesThrombus Aging +---------+---------------+---------+-----------+----------+--------------+ CFV      Full           Yes      Yes                                 +---------+---------------+---------+-----------+----------+--------------+ SFJ      Full                                                        +---------+---------------+---------+-----------+----------+--------------+ FV Prox  Full                                                        +---------+---------------+---------+-----------+----------+--------------+  FV Mid   Full                                                        +---------+---------------+---------+-----------+----------+--------------+ FV DistalFull                                                        +---------+---------------+---------+-----------+----------+--------------+ PFV      Full                                                        +---------+---------------+---------+-----------+----------+--------------+ POP      Full           Yes      Yes                                 +---------+---------------+---------+-----------+----------+--------------+ PTV      Full                                                        +---------+---------------+---------+-----------+----------+--------------+ PERO     Full                                                         +---------+---------------+---------+-----------+----------+--------------+     Summary: BILATERAL: - No evidence of deep vein thrombosis seen in the lower extremities, bilaterally. - RIGHT: - No cystic structure found in the popliteal fossa.  LEFT: - No cystic structure found in the popliteal fossa.  *See table(s) above for measurements and observations. Electronically signed by Orlie Pollen on 10/24/2022 at 6:47:57 PM.    Final    CT Head Wo Contrast  Result Date: 10/24/2022 CLINICAL DATA:  77 year old female with jerking, shaking. EXAM: CT HEAD WITHOUT CONTRAST TECHNIQUE: Contiguous axial images were obtained from the base of the skull through the vertex without intravenous contrast. RADIATION DOSE REDUCTION: This exam was performed according to the departmental dose-optimization program which includes automated exposure control, adjustment of the mA and/or kV according to patient size and/or use of iterative reconstruction technique. COMPARISON:  Brain MRI 07/21/2022.  Head CT 07/03/2020. FINDINGS: Brain: Cerebral volume is not significantly changed since 2021, within normal limits for age. 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 No midline shift, ventriculomegaly, mass effect, evidence of mass lesion, intracranial hemorrhage or evidence of cortically based acute infarction. Gray-white matter differentiation is stable and within normal limits for age. Vascular: Calcified atherosclerosis at the skull base. No suspicious intracranial vascular hyperdensity. Skull: No acute osseous abnormality identified. Sinuses/Orbits: Visualized paranasal sinuses and mastoids are stable and well aerated. Other:  No acute orbit or scalp soft tissue finding. IMPRESSION: Stable and normal for age non contrast CT appearance of the brain. Electronically Signed   By: Genevie Ann M.D.   On: 10/24/2022 05:18   DG Chest Port 1 View  Result Date: 10/24/2022 CLINICAL DATA:  77 year old  female missed dialysis. EXAM: PORTABLE CHEST 1 VIEW COMPARISON:  Portable chest 10/19/2022 and earlier. FINDINGS: Portable AP semi upright view at 0400 hours. Stable lung volumes. Stable cardiac size and mediastinal contours. On going left lung base hypo ventilation which appears in part related to small pleural effusion. Increased right lung and lung base veiling opacity and blunting of the right costophrenic angle. No superimposed pneumothorax, pulmonary edema, or consolidation. Paucity of bowel gas in the visible abdomen. Stable visualized osseous structures. Both glenohumeral joints are chronically dislocated since at least 07/21/2022. IMPRESSION: 1. Small bilateral pleural effusions, increased on the right since 10/19/2022. 2. No other acute cardiopulmonary abnormality. 3. Chronic bilateral glenohumeral joint dislocations. Electronically Signed   By: Genevie Ann M.D.   On: 10/24/2022 04:31   CT ABDOMEN PELVIS W CONTRAST  Result Date: 10/20/2022 CLINICAL DATA:  Abdominal pain. Worsening nausea. Diverticulitis on recent CT exam EXAM: CT ABDOMEN AND PELVIS WITH CONTRAST TECHNIQUE: Multidetector CT imaging of the abdomen and pelvis was performed using the standard protocol following bolus administration of intravenous contrast. RADIATION DOSE REDUCTION: This exam was performed according to the departmental dose-optimization program which includes automated exposure control, adjustment of the mA and/or kV according to patient size and/or use of iterative reconstruction technique. CONTRAST:  37m OMNIPAQUE IOHEXOL 350 MG/ML SOLN COMPARISON:  None Available. FINDINGS: Lower chest: Bilateral moderate pleural effusions with bibasilar passive atelectasis. No pericardial effusion. Hepatobiliary: No focal hepatic lesion. Normal gallbladder. No biliary duct dilatation. Common bile duct is normal. Pancreas: Pancreas is normal. No ductal dilatation. No pancreatic inflammation. Spleen: Normal spleen Adrenals/urinary tract:  Small rounded nodule measuring 12 mm along the lateral limb of the LEFT adrenal gland is unchanged in size from CT 2010. Lesion has low density at that time (HU equal 2) consistent benign adenoma. Bilateral renal cortical thinning. 18 mm partially exophytic round lesion in the mid RIGHT kidney has intermediate density on postcontrast imaging (HU equal 37). Lesion increased significantly in size from CT 2021(10 mm). Ureters and bladder normal. Stomach/Bowel: Stomach, duodenum small-bowel normal. Several diverticula of the LEFT colon and sigmoid colon. similar mild inflammatory stranding at the junction of the descending colon sigmoid colon. No abscess or perforation. Rectum normal. Vascular/Lymphatic: Abdominal aorta is normal caliber with atherosclerotic calcification. There is no retroperitoneal or periportal lymphadenopathy. No pelvic lymphadenopathy. Reproductive: Simple fluid attenuation 3.6 cm cystic lesion of the LEFT adnexa. Lesion large from 18 mm on CT 07/28/2020. Uterus normal. RIGHT ovary normal. Benign calcification extending from the RIGHT ovary. Other: No free fluid. Musculoskeletal: No aggressive osseous lesion. IMPRESSION: 1. Mild acute diverticulitis at the junction of the descending colon and sigmoid colon. No perforation or abscess. No significant change from 09/29/2022. 2. Bilateral moderate pleural effusions with passive atelectasis. 3. Indeterminate lesion of the RIGHT kidney. Recommend MRI with without contrast for further characterization. 4. Benign LEFT adrenal adenoma. 5. A 3.6 cm left adnexal simple-appearing cyst. Recommend follow-up pelvic ultrasound in 6-12 months. reference: JACR 2020 Feb;17(2):248-25 6.  Aortic Atherosclerosis (ICD10-I70.0). Electronically Signed   By: SSuzy BouchardM.D.   On: 10/20/2022 10:54   DG Chest 1 View  Result Date: 10/19/2022 CLINICAL DATA:  Shortness of breath and decreased appetite. EXAM:  CHEST  1 VIEW COMPARISON:  July 21, 2022 FINDINGS: The  heart size and mediastinal contours are within normal limits. Mild atelectasis and/or infiltrate is seen within the left lung base. There is a small left pleural effusion. No pneumothorax is identified. Multiple chronic bilateral rib fractures are seen. IMPRESSION: 1. Mild left basilar atelectasis and/or infiltrate. 2. Small left pleural effusion. Electronically Signed   By: Virgina Norfolk M.D.   On: 10/19/2022 21:53     TODAY-DAY OF DISCHARGE:  Subjective:   Makensie Mulhall today remains unresponsive but comfortable.  Objective:   Blood pressure (!) 71/32, pulse 68, temperature 98.2 F (36.8 C), temperature source Axillary, resp. rate (!) 5, height 5' (1.524 m), weight 65.7 kg, SpO2 99 %. No intake or output data in the 24 hours ending 11/09/22 1013 Filed Weights   11/03/22 1600 11/04/22 0411 11/05/22 1400  Weight: 62.9 kg 68.5 kg 65.7 kg    Exam: Sedated/unresponsive Comfortable Abdomen is soft nontender   PERTINENT RADIOLOGIC STUDIES: No results found.   PERTINENT LAB RESULTS: CBC: No results for input(s): "WBC", "HGB", "HCT", "PLT" in the last 72 hours. CMET CMP     Component Value Date/Time   NA 136 11/06/2022 0442   K 3.7 11/06/2022 0442   CL 102 11/06/2022 0442   CO2 26 11/06/2022 0442   GLUCOSE 72 11/06/2022 0442   BUN 35 (H) 11/06/2022 0442   CREATININE 3.30 (H) 11/06/2022 0442   CALCIUM 6.9 (L) 11/06/2022 0442   PROT 5.1 (L) 10/24/2022 0404   ALBUMIN 2.0 (L) 11/06/2022 0442   AST 7 (L) 10/24/2022 0404   ALT 5 10/24/2022 0404   ALKPHOS 96 10/24/2022 0404   BILITOT 0.8 10/24/2022 0404   GFRNONAA 14 (L) 11/06/2022 0442   GFRAA 9 (L) 07/03/2020 1218    GFR Estimated Creatinine Clearance: 12.3 mL/min (A) (by C-G formula based on SCr of 3.3 mg/dL (H)). No results for input(s): "LIPASE", "AMYLASE" in the last 72 hours. No results for input(s): "CKTOTAL", "CKMB", "CKMBINDEX", "TROPONINI" in the last 72 hours. Invalid input(s): "POCBNP" No results for  input(s): "DDIMER" in the last 72 hours. No results for input(s): "HGBA1C" in the last 72 hours. No results for input(s): "CHOL", "HDL", "LDLCALC", "TRIG", "CHOLHDL", "LDLDIRECT" in the last 72 hours. No results for input(s): "TSH", "T4TOTAL", "T3FREE", "THYROIDAB" in the last 72 hours.  Invalid input(s): "FREET3" No results for input(s): "VITAMINB12", "FOLATE", "FERRITIN", "TIBC", "IRON", "RETICCTPCT" in the last 72 hours. Coags: No results for input(s): "INR" in the last 72 hours.  Invalid input(s): "PT" Microbiology: No results found for this or any previous visit (from the past 240 hour(s)).  FURTHER DISCHARGE INSTRUCTIONS:  Get Medicines reviewed and adjusted: Please take all your medications with you for your next visit with your Primary MD  Laboratory/radiological data: Please request your Primary MD to go over all hospital tests and procedure/radiological results at the follow up, please ask your Primary MD to get all Hospital records sent to his/her office.  In some cases, they will be blood work, cultures and biopsy results pending at the time of your discharge. Please request that your primary care M.D. goes through all the records of your hospital data and follows up on these results.  Also Note the following: If you experience worsening of your admission symptoms, develop shortness of breath, life threatening emergency, suicidal or homicidal thoughts you must seek medical attention immediately by calling 911 or calling your MD immediately  if symptoms less severe.  You must  read complete instructions/literature along with all the possible adverse reactions/side effects for all the Medicines you take and that have been prescribed to you. Take any new Medicines after you have completely understood and accpet all the possible adverse reactions/side effects.   Do not drive when taking Pain medications or sleeping medications (Benzodaizepines)  Do not take more than prescribed  Pain, Sleep and Anxiety Medications. It is not advisable to combine anxiety,sleep and pain medications without talking with your primary care practitioner  Special Instructions: If you have smoked or chewed Tobacco  in the last 2 yrs please stop smoking, stop any regular Alcohol  and or any Recreational drug use.  Wear Seat belts while driving.  Please note: You were cared for by a hospitalist during your hospital stay. Once you are discharged, your primary care physician will handle any further medical issues. Please note that NO REFILLS for any discharge medications will be authorized once you are discharged, as it is imperative that you return to your primary care physician (or establish a relationship with a primary care physician if you do not have one) for your post hospital discharge needs so that they can reassess your need for medications and monitor your lab values.  Total Time spent coordinating discharge including counseling, education and face to face time equals greater than 30 minutes.  SignedOren Binet 11/09/2022 10:13 AM

## 2022-11-09 NOTE — TOC Transition Note (Signed)
Transition of Care [facility]) - CM/SW Discharge Note   Patient Details  Name: [name] MRN: [id number] Date of Birth: August 11, 1946  [facility] CM/SW Contact:  Benard Halsted, LCSW Phone Number: 11/09/2022, 11:18 AM   Clinical Narrative:    Patient will DC to: [facility] Anticipated DC date: 11/09/22 Family notified: Brother, Ilona Sorrel Transport by: Corey Harold   Per MD patient ready for DC to Hospice. RN to call report prior to discharge [phone]). RN, patient, patient's family, and facility notified of DC. Discharge Summary sent to facility. DC packet on chart including signed DNR. Ambulance transport requested for patient.   CSW will sign off for now as social work intervention is no longer needed. Please consult [place] again if new needs arise.     Final next level of care: [place] Barriers to Discharge: Barriers Resolved   Patient Goals and CMS Choice CMS Medicare.gov Compare Post Acute Care list provided to:: Patient Choice offered to / list presented to : Patient  Discharge Placement                Patient chooses bed at:  ([facility]) Patient to be transferred to facility by: [facility] Name of family member notified: Brother Patient and family notified of of transfer: 11/09/22  Discharge Plan and Services Additional resources added to the After Visit Summary for   In-house Referral: Clinical Social Work   Post Acute Care Choice: Home Health                               Social Determinants of Health (SDOH) Interventions SDOH Screenings   Food Insecurity: No Food Insecurity (10/25/2022)  Housing: Low Risk  (10/25/2022)  Transportation Needs: No Transportation Needs (10/25/2022)  Utilities: Not At Risk (10/25/2022)  Depression (PHQ2-9): Low Risk  (12/06/2019)  Tobacco Use: Medium Risk (11/06/2022)     Readmission Risk Interventions    10/31/2022    9:27 AM  Readmission Risk Prevention Plan   Transportation Screening Complete  Medication Review ([place]) Complete  PCP or Specialist appointment within 3-5 days of discharge Complete  HRI or [place] Complete  SW Recovery Care/Counseling Consult Complete  [place] Not Applicable

## 2022-12-09 DEATH — deceased

## 2023-01-10 ENCOUNTER — Ambulatory Visit (INDEPENDENT_AMBULATORY_CARE_PROVIDER_SITE_OTHER): Payer: HMO | Admitting: Podiatry

## 2023-01-10 DIAGNOSIS — Z91199 Patient's noncompliance with other medical treatment and regimen due to unspecified reason: Secondary | ICD-10-CM

## 2023-01-10 NOTE — Progress Notes (Signed)
No show
